# Patient Record
Sex: Female | Born: 1935 | Race: White | Hispanic: No | Smoking: Never smoker
Health system: Southern US, Community
[De-identification: ages and names within clinical notes are randomized; demographics above are authoritative.]

## PROBLEM LIST (undated history)

## (undated) ENCOUNTER — Emergency Department (HOSPITAL_COMMUNITY): Payer: Medicare Other | Source: Home / Self Care

## (undated) DIAGNOSIS — Z96649 Presence of unspecified artificial hip joint: Secondary | ICD-10-CM

## (undated) DIAGNOSIS — N183 Chronic kidney disease, stage 3 unspecified: Secondary | ICD-10-CM

## (undated) DIAGNOSIS — E039 Hypothyroidism, unspecified: Secondary | ICD-10-CM

## (undated) DIAGNOSIS — I2510 Atherosclerotic heart disease of native coronary artery without angina pectoris: Secondary | ICD-10-CM

## (undated) DIAGNOSIS — I495 Sick sinus syndrome: Secondary | ICD-10-CM

## (undated) DIAGNOSIS — I480 Paroxysmal atrial fibrillation: Secondary | ICD-10-CM

## (undated) DIAGNOSIS — F419 Anxiety disorder, unspecified: Secondary | ICD-10-CM

## (undated) DIAGNOSIS — I10 Essential (primary) hypertension: Secondary | ICD-10-CM

## (undated) DIAGNOSIS — R0602 Shortness of breath: Secondary | ICD-10-CM

## (undated) DIAGNOSIS — H547 Unspecified visual loss: Secondary | ICD-10-CM

## (undated) DIAGNOSIS — J45909 Unspecified asthma, uncomplicated: Secondary | ICD-10-CM

## (undated) DIAGNOSIS — K223 Perforation of esophagus: Secondary | ICD-10-CM

## (undated) DIAGNOSIS — J189 Pneumonia, unspecified organism: Secondary | ICD-10-CM

## (undated) DIAGNOSIS — S42293A Other displaced fracture of upper end of unspecified humerus, initial encounter for closed fracture: Secondary | ICD-10-CM

## (undated) DIAGNOSIS — I219 Acute myocardial infarction, unspecified: Secondary | ICD-10-CM

## (undated) DIAGNOSIS — M1990 Unspecified osteoarthritis, unspecified site: Secondary | ICD-10-CM

## (undated) DIAGNOSIS — Z950 Presence of cardiac pacemaker: Secondary | ICD-10-CM

## (undated) DIAGNOSIS — I4892 Unspecified atrial flutter: Secondary | ICD-10-CM

## (undated) DIAGNOSIS — O2230 Deep phlebothrombosis in pregnancy, unspecified trimester: Secondary | ICD-10-CM

## (undated) DIAGNOSIS — J9851 Mediastinitis: Secondary | ICD-10-CM

## (undated) DIAGNOSIS — W19XXXA Unspecified fall, initial encounter: Secondary | ICD-10-CM

## (undated) DIAGNOSIS — S72009A Fracture of unspecified part of neck of unspecified femur, initial encounter for closed fracture: Secondary | ICD-10-CM

## (undated) DIAGNOSIS — K219 Gastro-esophageal reflux disease without esophagitis: Secondary | ICD-10-CM

## (undated) DIAGNOSIS — E7800 Pure hypercholesterolemia, unspecified: Secondary | ICD-10-CM

## (undated) HISTORY — DX: Hypothyroidism, unspecified: E03.9

## (undated) HISTORY — DX: Unspecified atrial flutter: I48.92

## (undated) HISTORY — PX: JOINT REPLACEMENT: SHX530

## (undated) HISTORY — DX: Sick sinus syndrome: I49.5

## (undated) HISTORY — PX: TOTAL HIP ARTHROPLASTY: SHX124

---

## 2002-01-09 ENCOUNTER — Ambulatory Visit (HOSPITAL_COMMUNITY): Admission: RE | Admit: 2002-01-09 | Discharge: 2002-01-09 | Payer: Self-pay | Admitting: Family Medicine

## 2002-01-09 ENCOUNTER — Encounter: Payer: Self-pay | Admitting: Family Medicine

## 2002-06-04 ENCOUNTER — Encounter: Payer: Self-pay | Admitting: Family Medicine

## 2002-06-04 ENCOUNTER — Ambulatory Visit (HOSPITAL_COMMUNITY): Admission: RE | Admit: 2002-06-04 | Discharge: 2002-06-04 | Payer: Self-pay | Admitting: Family Medicine

## 2002-06-06 ENCOUNTER — Encounter: Payer: Self-pay | Admitting: Family Medicine

## 2002-06-06 ENCOUNTER — Ambulatory Visit (HOSPITAL_COMMUNITY): Admission: RE | Admit: 2002-06-06 | Discharge: 2002-06-06 | Payer: Self-pay | Admitting: Family Medicine

## 2002-08-05 ENCOUNTER — Encounter: Payer: Self-pay | Admitting: *Deleted

## 2002-08-05 ENCOUNTER — Ambulatory Visit (HOSPITAL_COMMUNITY): Admission: RE | Admit: 2002-08-05 | Discharge: 2002-08-05 | Payer: Self-pay | Admitting: *Deleted

## 2002-09-18 ENCOUNTER — Encounter: Payer: Self-pay | Admitting: Family Medicine

## 2002-09-18 ENCOUNTER — Ambulatory Visit (HOSPITAL_COMMUNITY): Admission: RE | Admit: 2002-09-18 | Discharge: 2002-09-18 | Payer: Self-pay | Admitting: Family Medicine

## 2002-12-11 ENCOUNTER — Other Ambulatory Visit: Admission: RE | Admit: 2002-12-11 | Discharge: 2002-12-11 | Payer: Self-pay | Admitting: Dermatology

## 2004-02-10 ENCOUNTER — Ambulatory Visit (HOSPITAL_COMMUNITY): Admission: RE | Admit: 2004-02-10 | Discharge: 2004-02-10 | Payer: Self-pay | Admitting: Family Medicine

## 2004-09-06 ENCOUNTER — Ambulatory Visit: Payer: Self-pay | Admitting: Orthopedic Surgery

## 2005-03-07 ENCOUNTER — Ambulatory Visit (HOSPITAL_COMMUNITY): Admission: RE | Admit: 2005-03-07 | Discharge: 2005-03-07 | Payer: Self-pay | Admitting: Family Medicine

## 2005-03-07 ENCOUNTER — Ambulatory Visit: Payer: Self-pay | Admitting: Orthopedic Surgery

## 2005-03-21 ENCOUNTER — Ambulatory Visit: Payer: Self-pay | Admitting: Orthopedic Surgery

## 2005-04-04 ENCOUNTER — Ambulatory Visit (HOSPITAL_COMMUNITY): Admission: RE | Admit: 2005-04-04 | Discharge: 2005-04-04 | Payer: Self-pay | Admitting: Family Medicine

## 2005-04-08 ENCOUNTER — Ambulatory Visit (HOSPITAL_COMMUNITY): Admission: RE | Admit: 2005-04-08 | Discharge: 2005-04-08 | Payer: Self-pay | Admitting: Family Medicine

## 2005-04-11 ENCOUNTER — Ambulatory Visit (HOSPITAL_COMMUNITY): Admission: RE | Admit: 2005-04-11 | Discharge: 2005-04-11 | Payer: Self-pay | Admitting: Family Medicine

## 2005-04-18 ENCOUNTER — Ambulatory Visit: Payer: Self-pay | Admitting: Orthopedic Surgery

## 2005-04-21 ENCOUNTER — Ambulatory Visit (HOSPITAL_COMMUNITY): Admission: RE | Admit: 2005-04-21 | Discharge: 2005-04-21 | Payer: Self-pay | Admitting: Family Medicine

## 2005-07-07 ENCOUNTER — Other Ambulatory Visit: Admission: RE | Admit: 2005-07-07 | Discharge: 2005-07-07 | Payer: Self-pay | Admitting: Dermatology

## 2005-07-21 ENCOUNTER — Ambulatory Visit (HOSPITAL_COMMUNITY): Admission: RE | Admit: 2005-07-21 | Discharge: 2005-07-21 | Payer: Self-pay | Admitting: *Deleted

## 2005-08-15 ENCOUNTER — Encounter (HOSPITAL_COMMUNITY): Admission: RE | Admit: 2005-08-15 | Discharge: 2005-09-15 | Payer: Self-pay | Admitting: Family Medicine

## 2005-09-07 ENCOUNTER — Ambulatory Visit: Payer: Self-pay | Admitting: Internal Medicine

## 2005-10-05 ENCOUNTER — Ambulatory Visit: Payer: Self-pay | Admitting: Internal Medicine

## 2005-12-02 ENCOUNTER — Ambulatory Visit: Payer: Self-pay | Admitting: Internal Medicine

## 2005-12-08 ENCOUNTER — Ambulatory Visit: Payer: Self-pay | Admitting: Internal Medicine

## 2006-01-06 ENCOUNTER — Ambulatory Visit (HOSPITAL_COMMUNITY): Admission: RE | Admit: 2006-01-06 | Discharge: 2006-01-06 | Payer: Self-pay | Admitting: Family Medicine

## 2007-01-09 ENCOUNTER — Ambulatory Visit (HOSPITAL_COMMUNITY): Admission: RE | Admit: 2007-01-09 | Discharge: 2007-01-09 | Payer: Self-pay | Admitting: Family Medicine

## 2007-02-15 ENCOUNTER — Ambulatory Visit (HOSPITAL_COMMUNITY): Admission: RE | Admit: 2007-02-15 | Discharge: 2007-02-15 | Payer: Self-pay | Admitting: *Deleted

## 2007-02-21 ENCOUNTER — Ambulatory Visit (HOSPITAL_COMMUNITY): Admission: RE | Admit: 2007-02-21 | Discharge: 2007-02-21 | Payer: Self-pay | Admitting: *Deleted

## 2007-05-22 ENCOUNTER — Ambulatory Visit: Payer: Self-pay | Admitting: Internal Medicine

## 2007-11-01 HISTORY — PX: CORONARY ARTERY BYPASS GRAFT: SHX141

## 2008-01-08 ENCOUNTER — Ambulatory Visit (HOSPITAL_COMMUNITY): Admission: RE | Admit: 2008-01-08 | Discharge: 2008-01-08 | Payer: Self-pay | Admitting: Family Medicine

## 2008-01-22 ENCOUNTER — Ambulatory Visit (HOSPITAL_COMMUNITY): Admission: RE | Admit: 2008-01-22 | Discharge: 2008-01-22 | Payer: Self-pay | Admitting: Family Medicine

## 2008-03-03 ENCOUNTER — Ambulatory Visit (HOSPITAL_COMMUNITY): Admission: RE | Admit: 2008-03-03 | Discharge: 2008-03-03 | Payer: Self-pay | Admitting: Family Medicine

## 2008-03-17 ENCOUNTER — Encounter: Admission: RE | Admit: 2008-03-17 | Discharge: 2008-03-17 | Payer: Self-pay | Admitting: Orthopedic Surgery

## 2008-05-29 ENCOUNTER — Ambulatory Visit (HOSPITAL_COMMUNITY): Admission: RE | Admit: 2008-05-29 | Discharge: 2008-05-29 | Payer: Self-pay | Admitting: Orthopedic Surgery

## 2008-07-21 ENCOUNTER — Inpatient Hospital Stay (HOSPITAL_COMMUNITY): Admission: RE | Admit: 2008-07-21 | Discharge: 2008-07-25 | Payer: Self-pay | Admitting: Orthopedic Surgery

## 2008-07-22 ENCOUNTER — Encounter (INDEPENDENT_AMBULATORY_CARE_PROVIDER_SITE_OTHER): Payer: Self-pay | Admitting: Orthopedic Surgery

## 2008-07-22 ENCOUNTER — Ambulatory Visit: Payer: Self-pay | Admitting: Vascular Surgery

## 2008-07-25 ENCOUNTER — Inpatient Hospital Stay: Admission: AD | Admit: 2008-07-25 | Discharge: 2008-08-02 | Payer: Self-pay | Admitting: Internal Medicine

## 2008-08-02 ENCOUNTER — Inpatient Hospital Stay (HOSPITAL_COMMUNITY): Admission: AD | Admit: 2008-08-02 | Discharge: 2008-08-18 | Payer: Self-pay | Admitting: Cardiology

## 2008-08-02 ENCOUNTER — Encounter: Payer: Self-pay | Admitting: Emergency Medicine

## 2008-08-02 ENCOUNTER — Ambulatory Visit: Payer: Self-pay | Admitting: Cardiothoracic Surgery

## 2008-08-03 ENCOUNTER — Encounter: Payer: Self-pay | Admitting: Cardiothoracic Surgery

## 2008-08-04 ENCOUNTER — Encounter: Payer: Self-pay | Admitting: Cardiothoracic Surgery

## 2008-08-08 ENCOUNTER — Encounter (INDEPENDENT_AMBULATORY_CARE_PROVIDER_SITE_OTHER): Payer: Self-pay | Admitting: Cardiology

## 2008-08-15 ENCOUNTER — Encounter: Payer: Self-pay | Admitting: Cardiothoracic Surgery

## 2008-08-18 ENCOUNTER — Inpatient Hospital Stay: Admission: RE | Admit: 2008-08-18 | Discharge: 2008-09-08 | Payer: Self-pay | Admitting: Internal Medicine

## 2008-08-29 ENCOUNTER — Ambulatory Visit: Payer: Self-pay | Admitting: Cardiothoracic Surgery

## 2008-08-29 ENCOUNTER — Encounter: Admission: RE | Admit: 2008-08-29 | Discharge: 2008-08-29 | Payer: Self-pay | Admitting: Cardiothoracic Surgery

## 2008-08-30 ENCOUNTER — Emergency Department (HOSPITAL_COMMUNITY): Admission: EM | Admit: 2008-08-30 | Discharge: 2008-08-30 | Payer: Self-pay | Admitting: Emergency Medicine

## 2008-10-06 ENCOUNTER — Encounter (HOSPITAL_COMMUNITY): Admission: RE | Admit: 2008-10-06 | Discharge: 2008-10-30 | Payer: Self-pay | Admitting: Orthopedic Surgery

## 2008-11-04 ENCOUNTER — Encounter (HOSPITAL_COMMUNITY): Admission: RE | Admit: 2008-11-04 | Discharge: 2008-12-04 | Payer: Self-pay | Admitting: Orthopedic Surgery

## 2008-12-15 ENCOUNTER — Encounter (HOSPITAL_COMMUNITY): Admission: RE | Admit: 2008-12-15 | Discharge: 2009-01-14 | Payer: Self-pay | Admitting: Cardiology

## 2009-01-06 ENCOUNTER — Ambulatory Visit: Payer: Self-pay | Admitting: Cardiothoracic Surgery

## 2009-01-06 ENCOUNTER — Inpatient Hospital Stay (HOSPITAL_COMMUNITY): Admission: RE | Admit: 2009-01-06 | Discharge: 2009-01-09 | Payer: Self-pay | Admitting: Cardiology

## 2009-01-06 HISTORY — PX: PACEMAKER INSERTION: SHX728

## 2009-01-07 ENCOUNTER — Encounter (INDEPENDENT_AMBULATORY_CARE_PROVIDER_SITE_OTHER): Payer: Self-pay | Admitting: Cardiology

## 2009-01-09 ENCOUNTER — Emergency Department (HOSPITAL_COMMUNITY): Admission: EM | Admit: 2009-01-09 | Discharge: 2009-01-09 | Payer: Self-pay | Admitting: Emergency Medicine

## 2009-02-09 ENCOUNTER — Encounter (HOSPITAL_COMMUNITY): Admission: RE | Admit: 2009-02-09 | Discharge: 2009-03-11 | Payer: Self-pay | Admitting: Cardiology

## 2009-03-13 ENCOUNTER — Encounter (HOSPITAL_COMMUNITY): Admission: RE | Admit: 2009-03-13 | Discharge: 2009-04-12 | Payer: Self-pay | Admitting: Cardiology

## 2009-04-13 ENCOUNTER — Encounter (HOSPITAL_COMMUNITY): Admission: RE | Admit: 2009-04-13 | Discharge: 2009-05-13 | Payer: Self-pay | Admitting: Cardiology

## 2009-04-28 ENCOUNTER — Emergency Department (HOSPITAL_COMMUNITY): Admission: EM | Admit: 2009-04-28 | Discharge: 2009-04-28 | Payer: Self-pay | Admitting: Emergency Medicine

## 2009-06-03 ENCOUNTER — Ambulatory Visit (HOSPITAL_COMMUNITY): Admission: RE | Admit: 2009-06-03 | Discharge: 2009-06-03 | Payer: Self-pay | Admitting: Urology

## 2009-06-24 ENCOUNTER — Ambulatory Visit (HOSPITAL_COMMUNITY): Admission: RE | Admit: 2009-06-24 | Discharge: 2009-06-24 | Payer: Self-pay | Admitting: Family Medicine

## 2009-07-27 ENCOUNTER — Encounter: Payer: Self-pay | Admitting: Internal Medicine

## 2009-07-28 ENCOUNTER — Ambulatory Visit: Payer: Self-pay | Admitting: Internal Medicine

## 2009-07-28 DIAGNOSIS — R0989 Other specified symptoms and signs involving the circulatory and respiratory systems: Secondary | ICD-10-CM

## 2009-07-28 DIAGNOSIS — R0609 Other forms of dyspnea: Secondary | ICD-10-CM | POA: Insufficient documentation

## 2009-08-14 HISTORY — PX: NM MYOCAR PERF WALL MOTION: HXRAD629

## 2009-08-17 ENCOUNTER — Telehealth: Payer: Self-pay | Admitting: Internal Medicine

## 2009-12-24 ENCOUNTER — Ambulatory Visit (HOSPITAL_COMMUNITY): Admission: RE | Admit: 2009-12-24 | Discharge: 2009-12-24 | Payer: Self-pay | Admitting: Family Medicine

## 2010-02-10 ENCOUNTER — Ambulatory Visit (HOSPITAL_COMMUNITY): Admission: RE | Admit: 2010-02-10 | Discharge: 2010-02-10 | Payer: Self-pay | Admitting: Family Medicine

## 2010-03-14 IMAGING — CR DG CHEST 2V
2 series · 2 of 2 positions shown · non-contrast
Comparison: 02/15/2007 and earlier.

CLINICAL DATA: 86-year-old female preoperative exam.

CHEST - 2 VIEW

[view not recorded (1 of 2)]
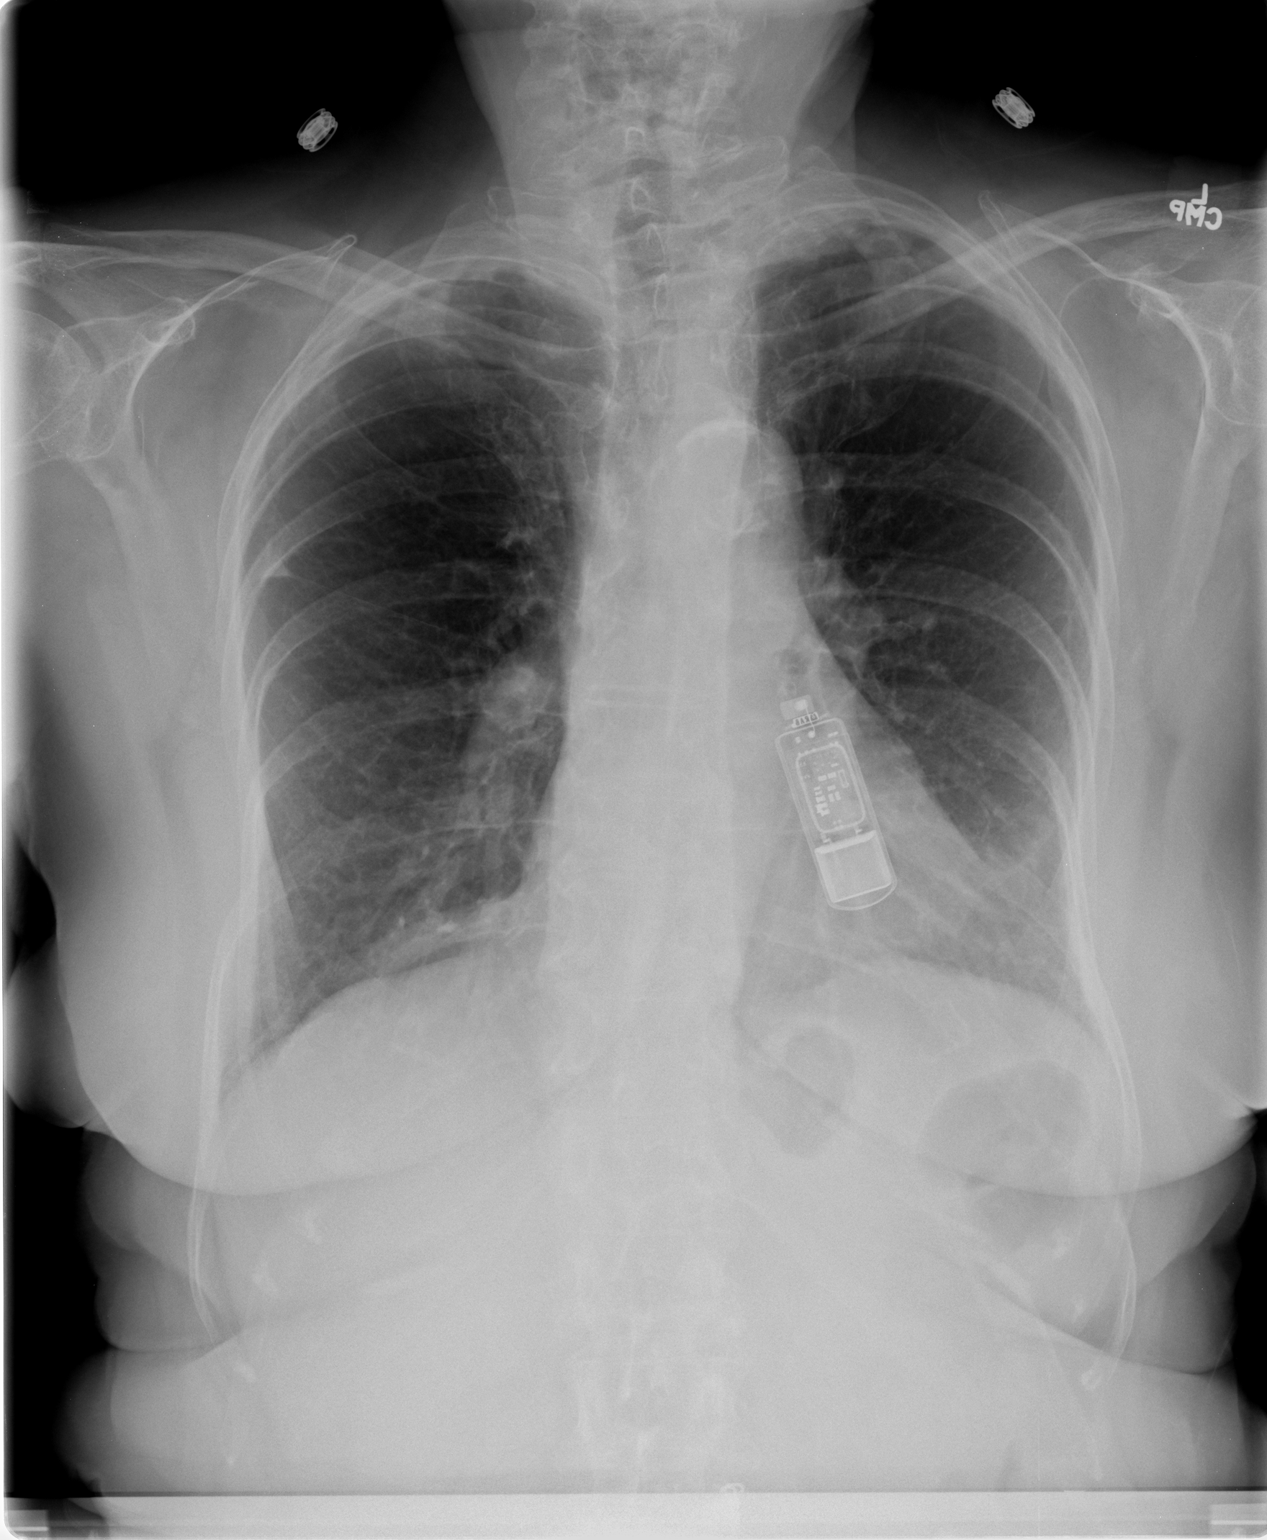

[view not recorded (2 of 2)]
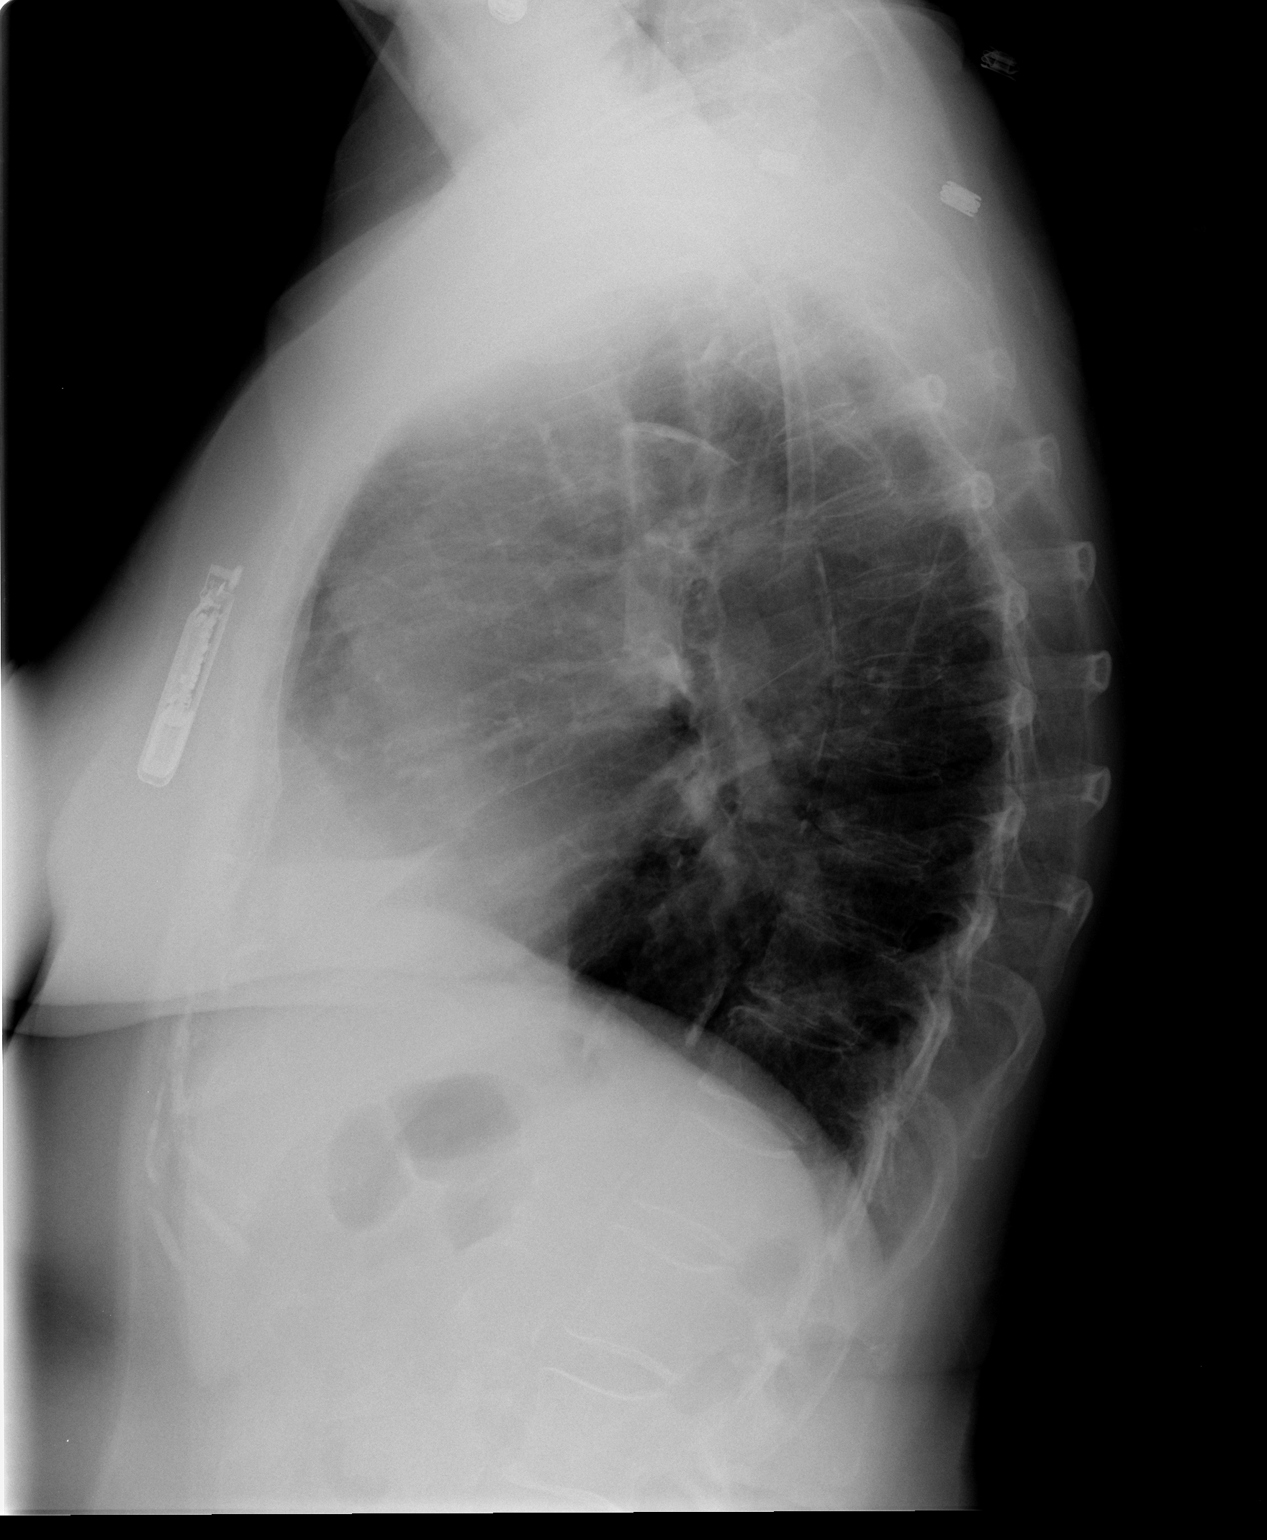

[2 of 2 positions shown; findings below may reference images not displayed]

FINDINGS: New electronic device in the left anterior chest wall.
Stable extensive calcified atherosclerosis of the aorta.  Stable
cardiac size and mediastinal contours.  No cardiomegaly. Stable
visualized osseous structures.  Chronic thickening/scarring along
the lateral aspect of the right major fissure is unchanged since
the CT dated 04/11/2005.  Stable biapical scarring.  No
pneumothorax, pulmonary edema, pleural effusion or acute airspace
opacity.
IMPRESSION: No acute cardiopulmonary abnormality.  New left chest wall
electronic device.

## 2011-02-10 LAB — GLUCOSE, CAPILLARY: Glucose-Capillary: 97 mg/dL (ref 70–99)

## 2011-03-09 ENCOUNTER — Other Ambulatory Visit (HOSPITAL_COMMUNITY): Payer: Self-pay | Admitting: Family Medicine

## 2011-03-09 ENCOUNTER — Encounter (HOSPITAL_COMMUNITY): Payer: Self-pay

## 2011-03-09 ENCOUNTER — Ambulatory Visit (HOSPITAL_COMMUNITY)
Admission: RE | Admit: 2011-03-09 | Discharge: 2011-03-09 | Disposition: A | Discharge status: Home / Self Care | Payer: Medicare Other | Source: Ambulatory Visit | Attending: Family Medicine | Admitting: Family Medicine

## 2011-03-09 DIAGNOSIS — M25579 Pain in unspecified ankle and joints of unspecified foot: Secondary | ICD-10-CM | POA: Insufficient documentation

## 2011-03-09 DIAGNOSIS — M109 Gout, unspecified: Secondary | ICD-10-CM

## 2011-03-09 DIAGNOSIS — M7730 Calcaneal spur, unspecified foot: Secondary | ICD-10-CM | POA: Insufficient documentation

## 2011-03-09 HISTORY — DX: Essential (primary) hypertension: I10

## 2011-03-15 NOTE — Op Note (Signed)
NAME:  Amy [name], Amy [name]                 ACCOUNT NO.:  [id number]   MEDICAL RECORD NO.:  [id number]          PATIENT TYPE:  INP   LOCATION:  5007                         FACILITY:  [facility]   PHYSICIAN:  Robert A. Noemi Chapel, M.D. DATE OF BIRTH:  June 30, 1922   DATE OF PROCEDURE:  07/21/2008  DATE OF DISCHARGE:                               OPERATIVE REPORT   PREOPERATIVE DIAGNOSIS:  Right hip degenerative joint disease.   POSTOPERATIVE DIAGNOSIS:  Right hip degenerative joint disease.   PROCEDURE:  Right total hip replacement using DePuy, hybrid cement to  total system with acetabulum 56-mm Press-Fit.  Pinnacle acetabulum with  2 locking screws and 10-degree polyethylene liner.  Femoral component #4  cemented some at femoral stem with -2 x36 mm hip ball with #3 cement  restricter and 11 mm centralizer.   SURGEON:  Audree Camel. Noemi Chapel, M.D.   ASSISTANT:  Matthew Saras, PA.   ANESTHESIA:  General.   OPERATIVE TIME:  1 hour and 30 minutes.   ESTIMATED BLOOD LOSS:  250 mL.   COMPLICATIONS:  None.   DESCRIPTION OF PROCEDURE:  Ms. Yapp was brought to the operating room  on July 21, 2008, placed on operating table in supine position.  She received Ancef 1 g IV preoperatively for prophylaxis.  After being  placed under general anesthesia, she had a Foley catheter placed under  sterile conditions.  Her right hip was examined under anesthesia.  She  had flexion at 90, extension is 0.  Internal and external rotation at 30  degrees.  Leg lengths were equal.  She was then placed on right lateral  decubitus position.  It is secured on the bed with a mark frame.  Her  right hip and leg was then prepped, using sterile DuraPrep and draped,  using sterile technique.  Originally through a 10-12 cm posterolateral  greater trochanteric incision, initial exposure was made.  The  underlying subcutaneous tissues were incised along with skin incision.  The iliotibial band, gluteus maximus fascia was  incised longitudinally  revealing the underlying sciatic nerve, which was carefully protected.  The short external rotators of the hip and hip capsules were released,  off the femoral neck insertion and tag.  The hip was then posteriorly  dislocated.  Femoral head was found to have grade 3 and grade 4 DJD.  Femoral neck cut was then made 1.5 to 2 cm above the lesser trochanter  in an appropriate amount of abduction and anteversion.  Sequentially  placed retractors were then placed around the acetabulum.  Degenerative  acetabular labrum was removed.  The acetabulum was found to have grade 3  and 4 DJD as well.  Sequential acetabular reaming was then carried out  up to 55-mm size and the appropriate amount of anteversion, abduction,  and inclination, then 56-mm acetabular trial was placed and this gave in  excellent fit.  It was then removed and the actual 56-mm Press-Fit  Pinnacle cup was hammered at position with an excellent fit and  appropriate amount of anteversion and abduction and inclination.  It was  further secured in  place with 2 separate 20-mm screws one in the 11  o'clock and one in the 12 o'clock position.  A 10-degree polyethylene  liner was then placed in the acetabular shell with a 10-degree lift in  the posterior and lateral position.  At that point, the proximal femur  was exposed, sequential reaming was carried out, and broaching to a #4  size and with a #4 broach in place and first a +1.5 and then a -2 hip  ball, which replaced each of these hip reduced.  There was found to be  excellent stability with both of these, but leg lengths were more equal  with the -2 x 36-mm hip ball.  The femoral trial was then removed and #3  cement plug was then placed with an excellent fit and then the femoral  canal was jet lavaged and irrigated with 3 L of saline.  It was then  filled with bone cement and then the actual #4 [place] stem with an 11-mm  centerline was placed down the  femoral canal with an excellent fit, but  the excess cement being removed from around the edges.  This was held in  position and the appropriate amount of anteversion while the cement  hardened.  After the cement hardened, then -2 x 36 mm hip ball was  placed.  The hip was reduced and taken through a full range of motion,  found to be stable up to 80 degrees of internal rotation in both neutral  and 30 degrees of adduction and also stable in abduction and external  rotation.  Leg lengths were found to be equal.  At this point, it is  felt that all the components were of excellent size and stability.  The  wound was further irrigated with saline and then short external rotation  of the hip and hip capsules were reattached to the femoral neck  insertion through to drill holes in the greater trochanter.  Iliotibial  band and gluteus maximus fascia was closed with #1 Ethibond sutures.  Subcutaneous tissues closed with 0 and 2-0 Vicryl.  Subcuticular layer  closed with 4-0 Monocryl.  Sterile dressing were applied.  The patient  then turned supine, checked for leg lengths, they were equal, rotation  of ankle, pulse is 2+ and symmetric.  She then had an abduction pillow  placed.  She was awakened and extubated and taken to recovery room in  stable condition.  Needle and sponge counts were correct x2 at the end  of case.      Robert A. Noemi Chapel, M.D.  Electronically Signed     RAW/MEDQ  D:  07/21/2008  T:  07/22/2008  Job:  [id number]

## 2011-03-15 NOTE — Discharge Summary (Signed)
NAME:  Amy [name], Amy [name]                 ACCOUNT NO.:  [id number]   MEDICAL RECORD NO.:  [id number]          PATIENT TYPE:  EMS   LOCATION:  ED                            FACILITY:  [facility]   PHYSICIAN:  Tery Sanfilippo, MD     DATE OF BIRTH:  1922-10-30   DATE OF ADMISSION:  01/09/2009  DATE OF DISCHARGE:                               DISCHARGE SUMMARY   DISCHARGE DIAGNOSES:  1. Symptomatic sinus bradycardia with a 4-second ventricular pauses      and marked fatigue.  2. Sick sinus syndrome.      a.     Explantation of loop recorder and implantation of a       Medtronic EnRhythm permanent transvenous pacemaker, serial number       PNP [id number], the patient in DDD mode.  3. Left pneumothorax postprocedure,  left chest catheter placed with      resolution of pneumothorax by January 08, 2009 to very minimal and      discontinuing of chest tube.  4. Right lower extremity discomfort.  Negative Deep vein thrombosis by      venous Doppler.  Sequential compression device stockings during the      end of hospitalization.  5. Coronary artery disease, history of bypass grafting in October 2009      after an inferior wall myocardial infarction.  6. History of atrial fibrillation with status post maze procedure.  7. History of bilateral deep vein thrombosis in September 2009      following hip replacement, now off Coumadin.   DISCHARGE CONDITION:  Improved.   PROCEDURES:  1. On January 06, 2009 explantation of loop recorder with implantation of      permanent transvenous pacemaker by Dr. Tery Sanfilippo.  2. On January 06, 2009 implantation of permanent transvenous pacemaker      Medtronic by Dr. Ardelia Mems.  3. On January 08, 2009, placement of chest tube by Dr. Tharon Aquas Trigt.   DISCHARGE MEDICATIONS:  1. Aspirin 81 mg 1 twice a day.  2. Fish oil 2 capsules daily.  3. Prilosec 20 mg daily.  4. Pravastatin 40 mg at bedtime.  5. Metoprolol 50 mg half-tablet twice a day, which will continue her      on  due to lower blood pressure in the hospital.  6. Lisinopril 5 mg daily.  7. Multivitamin daily.  8. Remeron 15 mg one-half daily in the evening.  9. Advair 100/50 one puff daily.  10.Tylenol Extra Strength as needed for pacemaker site pain.   DISCHARGE INSTRUCTIONS:  1. Follow up post pacemaker discharge instructions for movement, and      dressing, and site evaluation.  2. Low-sodium heart-healthy diet.  3. Follow up with Dr. Elisabeth Cara in [place] on January 14, 2009 at 11:30      a.m.  4. Follow up with Dr. Prescott Gum, as needed.   HISTORY OF PRESENT ILLNESS:  An 77 year old female with coronary artery  disease with MI and then bypass grafting in 2009.  For this, she had  complained of an increased fatigue and lack of  energy.  She had had a  loop recorder implanted earlier for syncope.  On the loop recorder, she  was found to have 4-second ventricular pauses and marked fatigue.  Previously, the patient has had atrial fibrillation and undergone a maze  procedure.  It was felt she needed a permanent transvenous pacemaker to  prevent further episodes of syncope as well as to treat her coronary  artery disease.  She was brought into the [facility] on January 06, 2009 by Dr. Elisabeth Cara and permanent transvenous pacemaker was placed  after removal of loop recorder.  The patient did well during procedure  and the next morning chest x-ray did reveal a pneumothorax.  Chest tube  was placed with improvement of pneumothorax.  By January 08, 2009,  pneumothorax improves less than 5% and the chest tube was removed on the  morning of January 09, 2009 only a sliver of pneumothorax remains,  otherwise lungs were aerated.   During hospitalization, the patient did complain of some right lower  extremity discomfort.  We did a venous Doppler that was negative for  DVT.  We did put SCD stockings on order them.  To prevent DVT, she had  has had one in the past but, she was high-risk for bleeding at the  pacer  and the pneumothorax site and we did not want a hemathorax, so SCD  stockings were the appropriate therapy.   By January 09, 2009, she was stable and ready for discharge home.  We will  follow up with Dr. Elisabeth Cara next week for pacer site evaluation.      Otilio Carpen. [place], N.P.      Tery Sanfilippo, MD  Electronically Signed    LRI/MEDQ  D:  01/09/2009  T:  01/10/2009  Job:  [id number]   cc:   Tery Sanfilippo, MD  Halford Chessman, M.D.  Eden Lathe. Einar Gip, MD  Ivin Poot, M.D.

## 2011-03-15 NOTE — Cardiovascular Report (Signed)
NAME:  Borgwardt, Amy                 ACCOUNT NO.:  [id number]   MEDICAL RECORD NO.:  [id number]          PATIENT TYPE:  INP   LOCATION:  2902                         FACILITY:  [facility]   PHYSICIAN:  Eden Lathe. Einar Gip, MD       DATE OF BIRTH:  [birth year]   DATE OF PROCEDURE:  08/02/2008  DATE OF DISCHARGE:                            CARDIAC CATHETERIZATION   PROCEDURES PERFORMED:  1. Left ventriculography.  2. Selective left coronary arteriography.  3. Percutaneous transluminal coronary angioplasty and thrombectomy of      right coronary artery using Fetch thrombectomy catheter.  4. Percutaneous transluminal coronary angioplasty and stenting of the      proximal right coronary artery with a 3.5 x 24 mm Driver stent.  5. Closure of the right femoral arterial access with StarClose.   INDICATIONS:  [name] is an 77 year old fairly active female  with history of hypertension, hyperlipidemia, osteoarthritis, status  post right hip replacement in September 2008 following a negative stress  Myoview.  She has developed a DVT following hip replacement and  presently was on Coumadin for the same.  She has had a couple of  episodes of syncope and she presently has an implantable loop recorder  for evaluation of the same.  She was doing well until early this morning  when she presented to [facility] after she had acute onset of  severe chest pain while eating in a restaurant with the family.  She was  found to have inferior ST-segment elevation with ST-segment depression  anteriorly, hence she was rushed emergently to Cardiac Catheterization  Lab as a STEMI to evaluate her coronary anatomy.  In the cath lab she was chest pain free and there was resolution of ST  elevation and hence case proceeded urgently as a NSTEMI.   HEMODYNAMIC DATA:  The left ventricle pressure was 140/1 with end-  diastolic pressure of 10 mmHg.  The aortic pressure was 135/61 with a  mean of 95 mmHg.  There was  no significant pressure gradient across the  aortic valve.   ANGIOGRAPHIC DATA:  Left ventricle:  Left ventricular systolic function  was normal with the ejection fraction of 55%-60%.  There was no regional  wall motion abnormality.  There was no significant mitral regurgitation.   Right coronary artery:  Right coronary artery is a large-caliber vessel.  It gives origin to 2-3 moderate-sized PDA branches and a very large PLA  branch.  The proximal segment of the right coronary artery has a  thrombotic 99% stenosis.  The rest of the segment is smooth and normal.   Left main coronary artery:  Left main coronary artery is large-caliber  vessel, smooth, and normal.   Circumflex coronary artery:  Circumflex coronary artery is a large-  caliber vessel.  It gives origin to a small obtuse marginal 1.  Smooth  and normal.   Ramus intermediate:  Ramus intermediate is a moderate-to-large caliber  vessel.  Proximal segment of the ramus intermediate has a high-grade 90-  95% stenosis.   LAD:  LAD is a large-caliber vessel  in the proximal segment.  However,  there was a very complex long segment 95% stenosis from the proximal to  the midsegment of the LAD followed by a tandem 99% stenosis.  The LAD  gives origin to several small diagonals and a small-to-moderate size  diagonal 2 in the midsegment.   DISCUSSION:  An elderly female with severe triple-vessel coronary artery  disease.  The patient was chest pain-free during cardiac catheterization  and had no wall motion abnormality and the ST-segment elevation was back  to baseline.  Hence, the further revascularization was done in a non-  STEMI fashion as she was hemodynamically stable with TIMI III flow in  all 3 vessels.  I did obtain a surgical consultation with Dr. Tharon Aquas  Trigt who felt that we could safely proceed with intervention to the  right coronary artery given on and off persistent pain without ST  changes in her chest, with  implantation of a non-drug-eluting stent  following which he plans to operate for the LAD and ramus intermediate  stenosis in the next few days after stabilization.  The RCA was clearly  the culprit vessel with significant thrombus burden and high grade  stenosis. During this process, we will continue with heparin and  Integrilin only without any Plavix.  She will be on aspirin.   TECHNIQUE OF INTERVENTION:  Successful thrombectomy using Fetch  catheter.  I was able to make 2 passes with the establishment of a brisk  flow and significant decrease in thrombus burden.  This was followed by  stenting with a 3.5 x 24 mm Driver stent, which was deployed at 12  atmospheric pressure for 34 seconds and postdilated with 3.75 x 21 mm  Sprinter [place] at 16 atmospheric pressure for 35 seconds.  Overall, the  stenosis was reduced from thrombotic 90% to 0% with brisk TIMI III to  TIMI III flow maintained at the end of the procedure.  The patient was  completely asymptomatic and chest pain-free.   RECOMMENDATIONS:  The patient will be admitted to the intensive care  unit for close followup.  She will be followed up by Dr. Tharon Aquas Trigt  for evaluation of bypass surgery to the left system.   A total of 140 mL of contrast was utilized for diagnostic and  interventional procedure.  Her left femoral arterial access was closed  with StarClose with excellent hemostasis.  Venous sheath was sutured in  place.   TECHNIQUE OF PROCEDURE:  Under usual sterile precautions, using a 6-  French left femoral venous access and 6-French multipurpose B2 catheter  was advanced into the ascending aorta and then to the left ventricle.  Left ventriculography was performed both in the LAO and RAO projection.  The catheter was then pulled into the ascending aorta and right coronary  was selectively engaged.  Angiography was performed, then left main  coronary artery was selectively engaged and angiography was performed,   then the catheter was pulled off the body in the usual fashion.   After having extensive discussion with Dr. Tharon Aquas Trigt for further  therapy, we decided to proceed with stenting of the right coronary  artery.   Exchanging the 6-French left femoral arterial access sheath to a 7-  [place] sheath, a 7-French FR-4 guidewire was advanced into the ascending  aorta, then right coronary artery was selectively engaged and  angiography was performed.  Using heparin for anticoagulation  maintaining ACT greater than 200, a 190 cm x 0.014 of  an Scientist, research (life sciences) guidewire was advanced into the right coronary artery.  Using  Fetch catheter, 2 Fetch passes were made.  There was establishment of  significant decrease in thrombus burden.  We decided to proceed with  stenting of the same with implantation of a 3.5 x 24 mm Driver stent  following which this was postdilated with a 3.75 x 21 mm Sprinter [place]  with excellent results.  The results were confirmed in multiple views.  The guide catheter was disengaged after the guidewire was pulled and  pulled out of the body over a J-wire.   Left femoral arteriography was performed through the arterial access  sheath and the access was closed with StarClose with excellent  hemostasis.  The patient tolerated the procedure well.  There was no  immediate complication noted.      Eden Lathe. Einar Gip, MD  Electronically Signed     JRG/MEDQ  D:  08/02/2008  T:  08/02/2008  Job:  [id number]   cc:   Halford Chessman, M.D.

## 2011-03-15 NOTE — Discharge Summary (Signed)
NAME:  Amy [name], Amy [name]                 ACCOUNT NO.:  [id number]   MEDICAL RECORD NO.:  [id number]          PATIENT TYPE:  INP   LOCATION:  5007                         FACILITY:  [facility]   PHYSICIAN:  Robert A. Noemi Chapel, M.D. DATE OF BIRTH:  09/28/1922   DATE OF ADMISSION:  07/21/2008  DATE OF DISCHARGE:                               DISCHARGE SUMMARY   ADDENDUM   DISCHARGE MEDICATIONS:  1. Protonix 40 mg p.o. daily.  2. Norvasc 5 mg 1 p.o. daily, hold if blood pressure less than 120/80.  3. Advair 100/50 one puff twice a day.  4. Hydrochlorothiazide 12.5 mg daily, hold if blood pressure less than      120/80.  5. Benicar 20 mg 1 tablet daily, hold if blood pressure less than      120/80.  6. Coumadin 4 mg daily.  7. Tylenol 325 mg 1-2 tablets p.o. q.4 h. p.r.n. pain.      Kirstin Shepperson, P.A.      Robert A. Noemi Chapel, M.D.  Electronically Signed    KS/MEDQ  D:  07/25/2008  T:  07/25/2008  Job:  [id number]

## 2011-03-15 NOTE — Discharge Summary (Signed)
NAME:  Amy [name], Amy [name]                 ACCOUNT NO.:  [id number]   MEDICAL RECORD NO.:  [id number]          PATIENT TYPE:  INP   LOCATION:  5007                         FACILITY:  [facility]   PHYSICIAN:  Robert A. Noemi Chapel, M.D. DATE OF BIRTH:  23-Jul-1922   DATE OF ADMISSION:  07/21/2008  DATE OF DISCHARGE:  07/25/2008                               DISCHARGE SUMMARY   ADMITTING DIAGNOSES:  1. End-stage degenerative joint disease, right hip.  2. Hypertension.  3. History of cardiac arrhythmia with a heart monitor implant.  4. Gastroesophageal reflux disease.  5. Osteoporosis.  6. Chronic obstructive pulmonary disease.   DISCHARGE DIAGNOSES:  1. End-stage degenerative joint disease, right hip status post total      hip replacement.  2. Postoperative blood-loss anemia.  3. Right leg deep venous thrombosis.  4. Preoperative renal insufficiency that has now resolved.  5. Hypertension.  6. Chronic obstructive pulmonary disease.  7. Gastroesophageal reflux disease.  8. Cardiac arrhythmia.   HISTORY OF PRESENT ILLNESS:  The patient is a 77 year old white female  with a history of end-stage DJD of her right hip.  She failed  conservative care including anti-inflammatories and intraarticular  cortisone injection.  Risks, benefits, and possible complications of a  right total hip replacement were discussed with the patient and her  family.  They understand these and are without question.   PROCEDURES IN-HOUSE:  On July 21, 2008, the patient underwent a  right total hip replacement by Dr. Para March.  She was admitted  postoperatively for pain control, DVT prophylaxis, and physical therapy.   HOSPITAL COURSE:  On postop day 1, the patient complained of right ankle  and calf pain.  A Doppler revealed a small DVT from mid calf distal to  ankle.  Therefore, the patient was changed to full-dose Lovenox with  renal dosing adjusted secondary to her renal insufficiency.  The patient  had a significant  amount of itching on postop day 1 from Vicodin, so she  was switched to Darvon.  On postop day 2 during the day, she had a  vasovagal episode while on the bedside commode with her blood pressure  dropping to 76/38 while sitting up and she was immediately put back to  bed.  Her blood pressure was 92/48 after being put back to bed.  She was  given 0.2 mg of Narcan and following her dose of Narcan and being placed  back to bed, her blood pressure rose and she was much more alert and  oriented.  She was placed back on IV fluids at 150 mL an hour for the  next 3 hours.  Two units of packed red blood cells were transfused  because her hemoglobin was 8.6 and she was obviously symptomatic.  She  was given 20 mg of Lasix IV between units.  Physical therapy was missed  in the afternoon due to this vasovagal episode.  On postop day 3, the  patient complained more of pain because we have been holding her pain  medicine due to the episodes yesterday and requiring Narcan.  Her  hemoglobin was  improved to 11.9 posttransfusion.  Her INR was still  subtherapeutic at 1.7.  Surgical wound was well approximated with  minimal swelling.  She had a 2+ dorsalis pedis pulse.  On postop day 3,  she walked approximately 25 feet in her morning session of physical  therapy and 46 feet with her afternoon session of physical therapy.  She  is reminded regularly of her posterior total hip precautions.  On postop  day 4 in the morning, her hemoglobin is stable at 11.9.  She is  metabolically stable with a creatinine of 0.98.  Her calcium is normal  at 8.6.  Her INR is therapeutic at 2.0.  Her O2 sats are 93% on room  air.  She has no shortness of breath.  Her blood pressure is stable at  129/63.  She is afebrile.  Her pulse is 76 and regular.  Surgical wound  is well approximated with a small amount of serous drainage.  She has a  moderate amount of swelling.  No redness.  No ecchymosis.  She has a 2+  dorsalis pedis  pulses.  She is being discharged to the pain center in  stable condition.  Weightbearing as tolerated with posterior total hip  precautions and a regular diet.  She will need physical therapy daily  for her ambulation and lower extremity strengthening.  She will need  occupational daily for ADLs.  She can shower on August 01, 2008.  She  needs to follow up with Dr. Para March in the office on August 04, 2008, for  staple removal and x-rays.  Our office number is [id number].  Please call  with a temperature greater than 101.5, increased pain, increased  swelling, increased drainage.      Kirstin Shepperson, P.A.      Robert A. Noemi Chapel, M.D.  Electronically Signed    KS/MEDQ  D:  07/25/2008  T:  07/25/2008  Job:  [id number]   cc:   Bonne Dolores, M.D.  Leslye Peer, MD

## 2011-03-15 NOTE — Op Note (Signed)
NAME:  Amy [name], Amy [name]                 ACCOUNT NO.:  [id number]   MEDICAL RECORD NO.:  [id number]          PATIENT TYPE:  INP   LOCATION:  [id number]                         FACILITY:  [facility]   PHYSICIAN:  Ivin Poot, M.D.  DATE OF BIRTH:  1922/01/13   DATE OF PROCEDURE:  01/07/2009  DATE OF DISCHARGE:                               OPERATIVE REPORT   OPERATION:  Placement of left chest tube.   PREOPERATIVE DIAGNOSIS:  Left pneumothorax status post transvenous  pacemaker placement.   POSTOPERATIVE DIAGNOSIS:  Left pneumothorax status post transvenous  pacemaker placement.   SURGEON:  Ivin Poot, MD   ANESTHESIA:  Local 1% lidocaine.   PROCEDURE:  Ms. Tambasco is an elderly patient who had undergone previous  coronary artery bypass grafting on August 09, 2008 for multivessel  disease.  She had a preoperative history of arrhythmias and had a  recording loop device implanted.  She gradually developed more problems  with brady arrhythmias and had a transvenous pacemaker placed by Dr.  Elisabeth Cara.  The pacemaker functioned well.  However, the x-ray on the  first postop day on January 07, 2009 showed a 25% left pneumothorax.  I  was asked to see Ms. Horsley for placement of a chest tube.   After informed consent and a time-out for the proper patient and proper  site was obtained, the left chest was prepped and draped as a sterile  field.  Lidocaine 1% was infiltrated in the third intercostal space in  the midclavicular line.  A small incision was made and through this a  chest catheter was inserted.  This was connected to an underwater seal  Pleur-evac drainage system and there was good fluctuation of the fluid  level with inspiration.  The Pleur-evac was placed to suction and a  followup chest x-ray showed significant reduction in the pneumothorax.  There were no complications.      Ivin Poot, M.D.  Electronically Signed     PV/MEDQ  D:  01/07/2009  T:  01/08/2009  Job:   [id number]

## 2011-03-15 NOTE — Consult Note (Signed)
NAME:  Amy [name], Amy [name]                 ACCOUNT NO.:  [id number]   MEDICAL RECORD NO.:  [id number]          PATIENT TYPE:  INP   LOCATION:  2902                         FACILITY:  [facility]   PHYSICIAN:  Ivin Poot, M.D.  DATE OF BIRTH:  1921/11/07   DATE OF CONSULTATION:  08/02/2008  DATE OF DISCHARGE:                                 CONSULTATION   PHYSICIAN REQUESTING CONSULTATION:  Ulice Dash R. Einar Gip, MD   PRIMARY CARDIOLOGIST:  Berneta Levins, MD   REASON FOR CONSULTATION:  Severe multivessel coronary disease with acute  inferior MI.   CHIEF COMPLAINT:  Chest pain.   HISTORY OF PRESENT ILLNESS:  I was asked to evaluate this 77 year old  white female for potential multivessel coronary bypass revascularization  for recently diagnosed severe three-vessel coronary disease.  The  patient underwent a right total hip replacement in September 2009.  She  had perioperative vasovagal spells with hypotension.  She was anemic and  received transfusions.  She developed a DVT of her right leg below the  knee and received a Coumadin therapy.  She was discharged home a week  ago.  She was admitted yesterday with acute resting chest pain.  She was  evaluated in the emergency department where an inferior EKG leads showed  ST-segment elevation.  She then underwent emergency cardiac cath by Dr.  Einar Gip, who demonstrated a 95% stenosis of the proximal RCA with  thrombus, which was treated with a bare-metal stent with good  reperfusion.  She also had significant disease of the left coronary  system with a 95% stenosis at the LAD and a 90% stenosis in the ramus  intermediate.  Her LVEF was 60%.  LVEDP was 15.  There is no significant  MR.  She was placed on Integralin and her Coumadin has been held with a  INR at presentation of 2.8.  The plan is to stabilize her and to proceed  with multivessel surgical evaluation to treat the left coronary system  next week if she recovers from her MI.  Her cardiac  enzymes are  currently declining and she has been hemodynamically stable without  chest pain.   PAST MEDICAL HISTORY:  1. COPD with GERD, reflux, and asthmatic bronchitis.  2. Recent total right hip replacement, July 21, 2008, with      perioperative right leg DVT on Coumadin and postop anemia requiring      transfusion.  3. Hypertension.  4. Osteoporosis.  5. History of cardiac arrhythmia, status post loop recorder placement      by Dr. Tami Ribas.   HOME MEDICATIONS:  1. Prilosec 20 mg a day.  2. Advair Diskus 100/50 b.i.d.  3. Norvasc 5 mg daily.  4. Hydrochlorothiazide 12.5 mg daily.  5. Benicar 20 mg daily.  6. Coumadin 3 mg daily.   ALLERGIES:  Intolerance to statins due to myalgias, nausea from Vicodin.   SOCIAL HISTORY:  She is a nonsmoker and nondrinker.  She lives with her  husband and has been active in driving.   SOCIAL HISTORY:  Positive for coronary artery disease in her family  and  her sister has had heart bypass surgery.   REVIEW OF SYSTEMS:  GENERAL:  No weight loss or fever.  She has been  walking with a rolling walker at home following hip surgery.  She still  has her skin staples in her hip incision.  They are due to come out this  week.  Incision itself has healed without drainage or cellulitis.  ENT:  Negative for active dental problems and she sees a dentist twice a year.  She denies difficulty swallowing.  She had no aspiration problems after  her hip replacement.  Thoracic review is negative for chest trauma or  recent URI.  CARDIAC:  Positive for coronary artery disease.  She has no  history of cardiac murmur or rheumatic heart disease as a child.  GI:  Positive for GERD, mild.  No hepatitis, jaundice, or blood per rectum.  UROLOGIC:  Negative for kidney Geyer or UTI.  Her urinalysis is clean.  ENDOCRINE:  Negative diabetes or thyroid disease - positive for DVT of  the right leg and negative for claudication or TIA.  NEUROLOGIC:  Negative stroke  or seizure.  HEMATOLOGIC:  Positive for anemia requiring  transfusion after hip replacement and a current hemoglobin of 8.2 g.  She denied any bleeding problems or nose bleeds on the Coumadin therapy.   PHYSICAL EXAMINATION:  VITAL SIGNS:  She is 5 feet 2 inches and weight  is 130 pounds, blood pressure 110/50, pulse 70 in sinus, respirations  18, and saturation 90% on 2 L.  GENERAL APPEARANCE:  A small elderly female in the CCU, in no acute  stress after PCI for acute DMI.  HEENT:  Normocephalic.  Dentition good.  NECK:  Without JVD.  She has a soft right carotid bruit.  She has no  cervical adenopathy or mass.  Breath sounds are clear.  There is no  thoracic deformity.  CARDIAC:  Rhythm is regular without S3 gallop or murmur.  ABDOMINAL:  Soft without pulsatile mass.  EXTREMITIES:  No clubbing, cyanosis, or edema.  Peripheral pulses are  intact.  NEUROLOGIC:  Nonfocal.   LABORATORY DATA:  I reviewed the coronary arteriograms with Dr. Einar Gip,  which show multivessel coronary artery disease in her right coronary,  the culprit vessel has been successfully treated with a PCI and bar-  mental stent.  Her creatinine is 1.0.  Her hemoglobin is 8.2.  Her INR  is decreased from 2.8-1.5 after a dose of vitamin K.  Her chest x-ray  shows some COPD, but no active infiltrate or pleural effusion.  She has  a sheath in her right groin and that is scheduled to be removed today.  Her platelet count on the Integrilin has remained normal.   IMPRESSION AND PLAN:  The patient has residual left coronary artery  disease with high-grade stenosis.  She would benefit from surgical  revascularization of these vessels as they are nonfavorable for stent  placement.  We will allow her to recover firm MI and allow the INR to  normalize and schedule surgery for coronary revascularization on August 06, 2008.      Ivin Poot, M.D.  Electronically Signed     PV/MEDQ  D:  08/03/2008  T:  08/04/2008   Job:  [id number]

## 2011-03-15 NOTE — Discharge Summary (Signed)
NAME:  Amy [name], Amy [name]                 ACCOUNT NO.:  [id number]   MEDICAL RECORD NO.:  [id number]          PATIENT TYPE:  INP   LOCATION:  2016                         FACILITY:  [facility]   PHYSICIAN:  Ivin Poot, M.D.  DATE OF BIRTH:  12-May-1922   DATE OF ADMISSION:  08/02/2008  DATE OF DISCHARGE:  08/14/2008                               DISCHARGE SUMMARY   HISTORY:  The patient is an 77 year old white female with a recent  history of a right total hip replacement in September of 2009.  Following this procedure, she had perioperative vasovagal changes with  hypotension.  She was treated for anemia.  She developed a deep venous  thrombosis of her right leg below the knee and was placed on Coumadin  therapy.  She was discharged approximately 1 week prior to this  admission.  On the date of admission, she developed acute resting chest  pain.  She presented to the emergency department where she was found to  have inferior EKG leads that revealed a ST-segment elevation.  She was  felt to require admission for further evaluation and treatment including  prompt cardiac catheterization.   PAST MEDICAL HISTORY:  1. COPD.  2. Gastroesophageal reflux.  3. History of asthmatic bronchitis.  4. History of recent right total hip replacement on July 21, 2008      with a perioperative right leg DVT.  5. History of recent postoperative anemia following her hip surgery.  6. Hypertension.  7. Osteoporosis.  8. History of cardiac arrhythmias status post loop recorder placement      by Dr. Tami Ribas.   PAST MEDICAL HISTORY:  1. Prilosec 20 mg daily.  2. Advair discus 100/50 one puff b.i.d.  3. Norvasc 5 mg daily.  4. Hydrochlorothiazide 12.5 mg daily.  5. Benicar 20 mg daily.  6. Coumadin 3 mg daily.   ALLERGIES:  1. Intolerance to STATINS with myalgias.  2. She also has a intolerance to VICODIN which causes significant      nausea.   FAMILY HISTORY/SOCIAL HISTORY/REVIEW OF  SYMPTOMS/PHYSICAL EXAMINATION:  Please see the history and physical done at the time of admission.   HOSPITAL COURSE:  The patient was admitted.  She was taken to the  cardiac catheterization lab where she was found to have significant  disease of the left coronary system to include a 95% stenosis of the LAD  and a 90% stenosis of the ramus intermedius.  Left ventricular ejection  fraction was 60%, and left ventricular end-diastolic pressure was 15.  There was no significant mitral regurgitation.  She was placed on  Integralin, and her Coumadin was placed on hold.  Initial INR at  hospitalization was 2.8.  Due to these findings, surgical consultation  was obtained with Ivin Poot, M.D. who evaluated the patient and  studies and agreed with recommendations to proceed with surgical  revascularization in the next several days following admission after  further evaluation and recovery from her infarction.  Of note, the  patient did undergo a PCI of the right coronary artery during the  catheterization by Dr.  Ganji.  A driver stent was placed.  The patient  had preoperative vein mapping, and the vein maps were felt to be  adequate for proceeding.  A 2-D echocardiogram was also obtained, and  this also confirmed normal left ventricular function.  Ejection fraction  was greater than 55%.  There was mild aortic insufficiency, mild mitral  regurgitation, trace tricuspid regurgitation and mild aortic stenosis.  The aortic valve area was 1.67 cm2.  Preoperative carotid duplex did  reveal a left ICA stenosis in the 60-80% range; however, closer to the  60% lesion.  The patient was felt to be stable, and on August 06, 2008,  she was taken to the cardiac operating room where she underwent the  following procedure:  Coronary artery bypass grafting x3.  The following  grafts were placed:  1. Left internal mammary artery to the LAD.  2. Saphenous vein graft to the ramus intermedius.  3. Saphenous  vein graft to the right coronary.   The patient did require 3 units of blood was transfused during the  procedure for preoperative hemoglobin of 7.4.   POSTOPERATIVE HOSPITAL COURSE:  The patient has done well overall.  She  did have a moderate volume overload requiring diuresis.  All routine  lines, monitors and drainage devices were discontinued in the standard  fashion.  She has maintained a normal sinus rhythm without significant  cardiac dysrhythmias or ectopy.  Her anemia was stabilized.  She did  require further transfusion during the postoperative period.  Her most  recent hemoglobin dated August 13, 2008 was 10.8.  Incisions are  healing well.  There is no evidence of infection.  Oxygen has been  weaned, and she maintains good saturations on room air.  Her Coumadin  has been restarted for deep venous thrombosis therapy that she will  require for an additional 3 months.  She has started physical therapy  and cardiac rehabilitation.  It is felt that she is best a candidate to  return to the skilled nursing facility.  Tentatively, that is scheduled  for the next day or so.   CONDITION ON TRANSFER:  Stable and improving.   MEDICATIONS ON DISCHARGE:  1. Toprol XL 25 mg daily.  2. Crestor 10 mg q.h.s.  3. Advair discus 100/50 one puff twice daily.  4. Coumadin dosage to be determined at discharge.  5. Plavix 75 mg daily.  6. Protonix 40 mg twice daily.  7. Lasix 40 mg daily.  8. Potassium chloride 20 mEq daily.  9. Lasix and potassium will be for an additional 7 days.  10.For pain, Ultram 50 mg 1-2 q.6h. as needed.   FOLLOWUP:  1. Dr. Prescott Gum on August 29, 2008 at 3:00 p.m.  2. Additionally, she is instructed to have an appointment arranged at      Dr. Martie Round office, [phone] in 2 weeks.  3. She should also have an appointment arranged see Dr. Noemi Chapel, [phone], in the next couple weeks for follow up for her hip surgery.   FINAL DIAGNOSIS:  Severe multivessel  disease with a presenting acute  inferior myocardial infarction, now status post surgical  revascularization as described.   OTHER DIAGNOSES:  1. Anemia.  2. Chronic obstructive pulmonary disease.  3. Gastroesophageal reflux.  4. Recent right total hip replacement on July 21, 2008.  5. Perioperative right leg deep venous thrombosis on Coumadin.  6. Hypertension.  7. Osteoporosis.  8. History of cardiac arrhythmia with previous  a loop recorder      placement.   WOUND CARE:  The patient may clean her incisions gently with soap and  water.   ACTIVITIES:  The patient should continue using her incentive spirometry  and doing deep breathing and coughing as tolerated daily.  She should  continue her physical therapy with progressive ambulation with total hip  replacement precautions and weightbearing as tolerated.  She should  continue sternal precautions, no lifting greater than 10 pounds, with  care not to have any exertion requiring pushing or pulling with her  arms.  She should have two person assistance with getting out of bed as  able, and this should be monitored in regard to her progression.      John Giovanni, P.A.-C.      Ivin Poot, M.D.  Electronically Signed    WEG/MEDQ  D:  08/13/2008  T:  08/13/2008  Job:  [id number]   cc:   Eden Lathe. Einar Gip, MD  Leslye Peer, MD  Audree Camel. Noemi Chapel, M.D.

## 2011-03-15 NOTE — Assessment & Plan Note (Signed)
OFFICE VISIT   Amy [name], Amy [name]  DOB:  23-Apr-1922                                        August 29, 2008  CHART #:  [id number]   The patient is status post coronary artery bypass grafting x3 by Dr. Prescott Gum on August 06, 2008.  Postop, the patient was progressing well, but  deconditioned mostly secondary to a recent right total hip replacement  on July 21, 2008.  It was felt that the patient will require  skilled nursing facility for inpatient rehab to improve her strength  prior to discharge home.  The patient presents today for followup visit.  The patient states that she is improving.  She is ambulating with a  walker about 280 feet daily.  She still is unable to stand by herself  from bed to standing or chair to standing.  She denies any significant  pain or shortness of breath.  She denies any opening or drainage from  any of her incision sites.  The patient states that she wants to go  home, but I would like the patient to be able to get out of bed on her  own prior to returning home.   PHYSICAL EXAMINATION:  VITAL SIGNS:  Blood pressure of 109/68, pulse of  88, respirations 18, and O2 sats 95% on room air.  RESPIRATORY:  Clear to auscultation bilaterally.  CARDIAC:  Regular rate and rhythm.  INCISIONS:  All incisions are clean, dry, intact, and healing well.  EXTREMITIES:  No significant peripheral edema noted.   STUDIES:  The patient had PA and lateral chest x-ray done today, August 29, 2008, which is clear.  Sternal wires are all stable and intact.  No  pneumonia, effusions, or atelectasis noted.  Improved bilateral  aeration.   IMPRESSION AND PLAN:  The patient is slowly improving.  She was seen and  evaluated by Dr. Prescott Gum.  Dr. Prescott Gum evaluated the patient's x-  ray.  Agreed to continue the patient's current medications.  Coumadin  continue to be dosed appropriately, with regular PT/INR checks.  The  patient has an appointment  with Dr. Mathis Bud next week, but is told to  follow up with one of her partners after Dr. Mathis Bud leaves the  practice.  The patient is recommended to stay another 1-2 weeks at the  skilled nursing facility at [facility] to continue working on her  strength.  We will release the patient from our office, but she is told  to contact us if she has any surgical issues.   Ivin Poot, [name].D.  Electronically Signed   KMD/MEDQ  D:  08/29/2008  T:  08/30/2008  Job:  [id number]   cc:   Leslye Peer, MD

## 2011-03-15 NOTE — Discharge Summary (Signed)
NAME:  Amy [name], Amy [name]                 ACCOUNT NO.:  [id number]   MEDICAL RECORD NO.:  [id number]          PATIENT TYPE:  INP   LOCATION:  2016                         FACILITY:  [facility]   PHYSICIAN:  Ivin Poot, M.D.  DATE OF BIRTH:  June 10, 1922   DATE OF ADMISSION:  08/02/2008  DATE OF DISCHARGE:  08/18/2008                               DISCHARGE SUMMARY   ADDENDUM:  The patient was tentatively scheduled for transfer to the  nursing facility on August 15, 2008.  She, however, on that date felt  fairly poorly compared to the previous days.  Additionally, she felt  quite weak.  She appeared to look more pale.  She underwent an  evaluation for this including electrolytes and CBC.  These were  essentially unremarkable.  Her hemoglobin and hematocrit were actually  somewhat improved.  Electrolytes were within normal limits.  Chest x-ray  appeared to be improved, as well as in regards to her atelectasis and  air space disease.  As she had a previous history of DVT, a venous  duplex was reobtained to both lower extremities, and she was found to  have clearing of her DVT on the right side; however, a new DVT was found  no the left side to the level of her common femoral vein.  No other  significant findings were found, and a CT scan was negative for  pulmonary embolism.  Over the weekend, she continued to progress quite  nicely.  She is now at essentially her baseline.  She is now felt to be  stable for transfer to the nursing facility on today's date.   MEDICATIONS AT TIME OF THIS DICTATION:  1. Toprol-XL 25 mg daily.  2. Crestor 10 mg q.h.s.  3. Ultram 50 mg 1-2 every 6 hours as needed for pain.  4. Lasix 20 mg daily.  5. Protonix 40 mg daily.  6. Plavix 75 mg daily.  7. Coumadin 2.5 mg daily.  8. Advair 100/50 Diskus one puff twice daily.   For further details of this dictation, please see the previously-  dictated summary.   Her first INR should be drawn on August 20, 2008 with  the results to be  paged to Dr. Tharon Aquas Trigt at [phone], or his office number is [phone].      John Giovanni, P.A.-C.      Ivin Poot, M.D.  Electronically Signed    WEG/MEDQ  D:  08/18/2008  T:  08/18/2008  Job:  [id number]   cc:   Eden Lathe. Einar Gip, MD  Leslye Peer, MD

## 2011-03-15 NOTE — Op Note (Signed)
NAME:  Amy [name], Amy [name]                 ACCOUNT NO.:  [id number]   MEDICAL RECORD NO.:  [id number]          PATIENT TYPE:  INP   LOCATION:  2308                         FACILITY:  [facility]   PHYSICIAN:  Ivin Poot, M.D.  DATE OF BIRTH:  08/21/1922   DATE OF PROCEDURE:  08/06/2008  DATE OF DISCHARGE:                               OPERATIVE REPORT   OPERATION:  1. Coronary artery bypass grafting x3 (left internal mammary artery to      LAD, saphenous vein graft to ramus intermediate, and saphenous vein      graft to right coronary artery).  2. Endoscopic harvest of the left leg greater saphenous vein.   SURGEON:  Ivin Poot, MD   ASSISTANT:  John Giovanni, PA-C.   PREOPERATIVE DIAGNOSIS:  Acute myocardial infarction with severe 3-  vessel coronary disease.   POSTOPERATIVE DIAGNOSIS:  Acute myocardial infarction with severe 3-  vessel coronary disease.   ANESTHESIA:  General.   INDICATIONS:  The patient is an 77 year old female who presented 5 days  previously with an acute inferior MI with ST-segment elevation.  Emergency cardiac cath demonstrated an ulcerated plaque with thrombus in  the proximal right coronary, which was opened with a bare metal stent.  She also had a long 90% stenosis of the proximal LAD and a long 80-90%  stenosis of the ramus intermediate.  At the time of her presentation,  the plan was to treat the culprit vessel with a PCA to the right  coronary artery followed by a subsequent surgical evaluation of the 3-  vessel disease.  The patient tolerated the PCI of the right coronary  artery well and was maintained on IV Integrilin.  She is brought to  Surgery today for complete surgical revascularization.  She has remained  hemodynamically stable, and her LV function by post-PCI echo was fairly  well preserved.   Prior to surgery, I reviewed the results of the cath on several  occasions with the patient and her family.  I discussed the indications,  benefits, and risks of surgical coronary revascularization for treatment  her 3-vessel coronary artery disease.  I reviewed the alternatives to  surgical therapy as well as the major surgical issues including location  of the surgical incision, use of general anesthesia, cardiopulmonary  bypass, and expected postoperative hospital recovery.  I discussed with  her the risks of this operation including risks of MI, CVA, bleeding,  infection, and death.  She understood that she would probably need blood  transfusion therapy due to her baseline anemia and preoperative extended  Integrilin administration.  After reviewing these issues, she  demonstrated her understanding and agreed to proceed with surgery under  what I felt was an informed consent.   OPERATIVE FINDINGS:  1. Severe 3-vessel coronary artery disease, treated with CABG x3.  2. Good conduit from the left leg harvest.  3. Small but patent mammary with excellent flow.  4. Intraoperative severe anemia requiring transfusion of 3 units of      packed cells to keep the hemoglobin of above 7 grams.  PROCEDURE:  The patient was brought to the operative room and placed  supine on the operating table where general anesthesia was induced.  The  chest, abdomen, and legs were prepped with Betadine and draped as a  sterile field.  A sternal incision was made and the saphenous vein was  harvested endoscopically from the left leg.  The left internal mammary  artery was harvested as a pedicle graft from its origin at the  subclavian vessels.  It was a good vessel, but small with adequate flow.  Heparin was administered and the sternal retractor was placed.  The  pericardium was opened and suspended.  The aorta was mildly dilated, but  without severe calcification.  The LV was mildly hypertrophied, but  without scar.  Purse-strings were placed in the ascending aorta and  right atrium.  After the vein had been harvested and inspected and found   to be adequate, the patient was cannulated and placed on bypass.  The  coronaries were identified for grafting.  I decided to graft the right  coronary artery due to the presence of a recently placed bare metal  stent in the area of a ulcerated plaque to protect from any future in-  graft stenosis or thrombus, especially in the early postoperative  period.  The right coronary ramus and LAD were adequate targets.  However, the LAD was deeply intramyocardial.  Cardioplegia catheters  were placed for antegrade and retrograde cold blood cardioplegia.  The  patient was cooled to 32 degrees.  Aortic crossclamp was applied and 800  mL of cardioplegia was delivered with good cardioplegic arrest and  septal temperature dropped less than 15 degrees.  Cardioplegia was then  delivered every 15-20 minutes or less while the crossclamp was applied.   The distal coronary anastomoses were then performed.  The first distal  anastomosis was to the distal RCA.  This was a 1.5-mm vessel with a  severe proximal disease that had been treated 5 days ago with a bare  metal stent.  A reverse saphenous vein was sewn end-to-side with running  7-0 Prolene with excellent flow through the graft.  The second distal  anastomosis was the ramus branch of left coronary.  This was a 1.5-mm  vessel and proximal 80-90% stenosis.  A reverse saphenous vein was sewn  end-to-side with running 7-0 Prolene with good flow through the graft.  Cardioplegia was redosed.  The third distal anastomosis was to the mid  LAD, which was deeply intramyocardial.  The left IMA pedicle was brought  down into the area of dissection at the base of which was the LAD  vessel.  An arteriotomy was made in the LAD and the mammary artery was  sewn end-to-side with running 8-0 Prolene.  There was a good flow  through the anastomosis after briefly releasing the pedicle bulldog on  the mammary artery.  The bulldog was reapplied and the pedicle was  secured  to the epicardium.  Cardioplegia was redosed.   While the crossclamp was still in place, 2 proximal vein anastomoses  were performed using a 4.0-mm punch running 7-0 Prolene.  Prior to tying  down the final proximal anastomosis, air was vented from the coronaries  with a dose of retrograde warm blood cardioplegia.  Cross clamp was then  removed.   The heart resumed a spontaneous rhythm.  The cardioplegia catheters were  removed.  Air was aspirated from the vein grafts with a 27 gauge needle.  Vein grafts were opened  and each had good flow.  Hemostasis was  documented at the proximal and distal anastomoses.  The patient was  rewarmed and reperfused to 37 degrees.  Temporary pacing wires were  applied.  The lungs were expanded and the ventilator was resumed.  The  patient was then weaned from bypass without inotropes without  difficulty.  Blood pressure were stable off bypass.  Protamine was  administered without adverse reaction.  The cannula was removed.  The  mediastinum was re-irrigated with warm antibiotic irrigation.  The leg  incision was irrigated and closed in a standard fashion.  The superior  pericardial fat was closed over the aorta.  Two mediastinal and a left  pleural chest tubes were placed and brought out through separate  incisions.  The sternum was closed with interrupted steel wire.  The  pectoralis fascia was closed with a running #1 Vicryl.  The subcutaneous  and skin layers were closed with running Vicryl and sterile dressings  were applied.  Total bypass time was 110 minutes.      Ivin Poot, M.D.  Electronically Signed     PV/MEDQ  D:  08/06/2008  T:  08/07/2008  Job:  [id number]   cc:   Eden Lathe. Einar Gip, MD

## 2011-03-15 NOTE — Assessment & Plan Note (Signed)
[facility]                             PULMONARY OFFICE NOTE   NAME:Amy [name], Amy [name]                        MRN:          [id number]  DATE:05/22/2007                            DOB:          23-Mar-1922    HISTORY:  An 77 year old white female with very mild airflow obstruction  documented by previous PFTs in February 2007, with a clinical history  suggesting asthma that has been totally eliminated with the use of  Advair 100/50 b.i.d. She is short of breath with exertion, but denies  any variability or nocturnal wheeze or cough, fevers, chills, sweats,  chest pain or leg swelling.   For full inventory of medications, please see face sheet dated May 22, 2007, which was reviewed with the patient in detail and is correct as  listed.   PHYSICAL EXAMINATION:  She is a pleasant, ambulatory, white female in no  acute distress. She has stable vital signs.  HEENT: Is unremarkable. Oropharynx is clear.  LUNGS: Lung fields are completely clear bilaterally to auscultation and  percussion.  HEART: Regular rate and rhythm without murmur, gallop or rub.  ABDOMEN: Soft, benign.  EXTREMITIES: Warm without calf tenderness, cyanosis, clubbing or edema.   IMPRESSION:  This patient only has very mild asthma and this has  occurred in the setting of overt reflux for which she has been  maintained on PPIs as well. It may well be that reflux is to asthma in  elderly adults what allergies are to asthma in younger children, but in  any case, she appears to have enough of both reflux and asthma to treat  her chronically with both PPI therapy and Advair at low doses.   I made sure she could use this effectively and reviewed with her that if  she begins to have more breakthrough symptoms despite treatment with  both PPI and Advair that I would like to see her back here in  [place]. Otherwise, she can certainly refill her Advair through Dr.  Hulen Luster office.     Christena Deem. Melvyn Novas, MD, [facility]  Electronically Signed    MBW/MedQ  DD: 05/22/2007  DT: 05/22/2007  Job #: [id number]   cc:   Bonne Dolores, [name].D.  Leslye Peer, MD

## 2011-03-18 NOTE — Cardiovascular Report (Signed)
NAME:  Gallaher, [place]                 ACCOUNT NO.:  [id number]   MEDICAL RECORD NO.:  [id number]          PATIENT TYPE:  OIB   LOCATION:  2899                         FACILITY:  [facility]   PHYSICIAN:  Octavia Heir, MD  DATE OF BIRTH:  05/16/1922   DATE OF PROCEDURE:  07/21/2005  DATE OF DISCHARGE:  07/21/2005                              CARDIAC CATHETERIZATION   PROCEDURE PERFORMED:  Head up tilt table.   CARDIOLOGIST:  Octavia Heir, M.D.   COMPLICATIONS:  None.   INDICATIONS:  Ms. Napora is an 77 year old female, patient of Dr. Eddie Candle  and Dr. Odette Fraction with a history of hypertension, hyperlipidemia,  history of dizziness and syncope with three falls over the last one year.  She has had a 2-D echocardiogram, which revealed a normal left ventricular  size and EF with no significant valvular disease.  She also had a Cardiolite  scan, which revealed no significant ischemia and a normal EF; and, a Holter  revealed no significant arrhythmia.  She is now here for tilt table testing  to rule out near cardiogenic __________ .   DESCRIPTION OF PROCEDURE:  After obtaining informed written consent the  patient was brought to the cardiac cath lab where heart rate and BP were  measured, and she was placed in a supine position with the patient on  hemodynamics.  Resting heart rate was 65 with a resting blood pressure of  165/82.  The patient was monitored for approximately five minutes and then  tilted to the 70-degree heads up position.  She remained hemodynamically  stable with the only complaint of mild nausea, and with no significant  change in heart rate or blood pressure.  After 45 minutes she was returned  to the supine position with no significant change in hemodynamics.   The patient was then taken off the table and taken to the recovery room in  stable condition.   CONCLUSION:  Negative heads up tilt table testing.      Octavia Heir, MD  Electronically  Signed     RHM/MEDQ  D:  07/21/2005  T:  07/21/2005  Job:  [id number]   cc:   Halford Chessman, M.D.  Fax: [phone]   Leslye Peer, MD  Fax: [phone]

## 2011-03-18 NOTE — Op Note (Signed)
NAME:  Amy [name], Amy [name]                 ACCOUNT NO.:  [id number]   MEDICAL RECORD NO.:  [id number]          PATIENT TYPE:  OIB   LOCATION:  2899                         FACILITY:  [facility]   PHYSICIAN:  Octavia Heir, MD  DATE OF BIRTH:  Mar 04, 1922   DATE OF PROCEDURE:  02/21/2007  DATE OF DISCHARGE:                               OPERATIVE REPORT   PROCEDURE:  Insertion of a [employer] Reveal DX loop recorder model  [id number], serial [id number].   SURGEON:  Octavia Heir, M.D.   COMPLICATIONS:  None.   INDICATION:  Ms. Tines is an 77 year old female, patient of Dr. Odette Fraction, Dr. Eddie Candle, with a history of hypertension,  hyperlipidemia, history of recurrent syncope over the last 1-2 years.  She has no antecedent symptoms prior to these episodes.  She has some  extensive workup through Dr. Martie Round office with no found etiology.  She is now brought for loop recorder insertion in an attempt to capture  any significant arrhythmias since possible etiology of her syncope.   DESCRIPTION OF PROCEDURE:  After obtaining informed consent, the patient  was brought into the cardiac catheterization laboratory where the left  chest was prepped and draped in the sterile fashion.  ECG monitoring was  established, 1% lidocaine used to anesthetize approximately 2 cm below  the left clavicle and 2 cm lateral to the sternum.  Next, approximately  1.5 cm incision was then carried out in a horizontal fashion and  hemostasis was obtained with electrocautery.  Following this, blunt  dissection was used to carry this down to the left pectoral fascia and  approximately 1 x 3 cm pocket was then created over the left pectoral  fascia.  Hemostasis was again confirmed with electrocautery.  The 1%  kanamycin solution was used to irrigate the pocket and again hemostasis  was confirmed.  The 2-0 silk sutures were then placed at the superior  aspect of the pocket and anchored to the pectoral fascia.   Following  this, a Reveal DX loop recorder model [id number], serial [id number] was then  inserted into the pocket and the header was secured to 2-0 silk sutures.  The subcutaneous layers were then closed using 2-0 Vicryl.  The skin is  then closed with 4-0 Vicryl.  Steri-Strips were applied.  The patient  transferred to the recovery room in stable condition.   CONCLUSIONS:  Successful implant of a Reveal DX loop recorder model  #[id number], serial [id number].      Octavia Heir, MD  Electronically Signed     RHM/MEDQ  D:  02/21/2007  T:  02/21/2007  Job:  [id number]   cc:   Odette Fraction, MD  Halford Chessman, M.D.

## 2011-08-01 LAB — BASIC METABOLIC PANEL
BUN: 11
BUN: 18
BUN: 26 — ABNORMAL HIGH
CO2: 26
CO2: 26
CO2: 28
Calcium: 7.9 — ABNORMAL LOW
Calcium: 8.2 — ABNORMAL LOW
Calcium: 8.5
Chloride: 101
Chloride: 103
Chloride: 103
Creatinine, Ser: 0.98
Creatinine, Ser: 1.02
Creatinine, Ser: 1.26 — ABNORMAL HIGH
GFR calc Af Amer: 49 — ABNORMAL LOW
GFR calc Af Amer: >60
GFR calc non Af Amer: 34 — ABNORMAL LOW
GFR calc non Af Amer: 54 — ABNORMAL LOW
Glucose, Bld: 106 — ABNORMAL HIGH
Glucose, Bld: 140 — ABNORMAL HIGH
Glucose, Bld: 98
Glucose, Bld: 99
Potassium: 4
Potassium: 4.7

## 2011-08-01 LAB — CBC
HCT: 30.6 — ABNORMAL LOW
HCT: 35 — ABNORMAL LOW
HCT: 40.4
HCT: 31.1 — ABNORMAL LOW
Hemoglobin: 10.4 — ABNORMAL LOW
Hemoglobin: 13.6
MCHC: 33.9
MCHC: 34.1
MCHC: 34.2
MCHC: 34.3
MCV: 86.6
MCV: 87.5
MCV: 87.8
MCV: 87.7
Platelets: 169
Platelets: 177
Platelets: 196
Platelets: 455 — ABNORMAL HIGH
RBC: 2.94 — ABNORMAL LOW
RBC: 3.99
RDW: 14
RDW: 14.2
RDW: 14.3
RDW: 14.5
RDW: 14.6
RDW: 14.5
WBC: 6.9

## 2011-08-01 LAB — COMPREHENSIVE METABOLIC PANEL
Albumin: 4
BUN: 18
CO2: 28
Chloride: 100
Creatinine, Ser: 1.4 — ABNORMAL HIGH
GFR calc non Af Amer: 36 — ABNORMAL LOW
Glucose, Bld: 117 — ABNORMAL HIGH
Total Bilirubin: 0.9

## 2011-08-01 LAB — DIFFERENTIAL
Basophils Absolute: 0.1
Basophils Absolute: 0.1
Basophils Relative: 1
Basophils Relative: 1
Eosinophils Absolute: 0.2
Eosinophils Relative: 2
Lymphocytes Relative: 29
Neutro Abs: 4.2
Neutrophils Relative %: 61

## 2011-08-01 LAB — PROTIME-INR
INR: 0.9
INR: 1.5
Prothrombin Time: 12.2
Prothrombin Time: 18.4 — ABNORMAL HIGH
Prothrombin Time: 20.9 — ABNORMAL HIGH
Prothrombin Time: 23.7 — ABNORMAL HIGH
Prothrombin Time: 31.7 — ABNORMAL HIGH

## 2011-08-01 LAB — URINALYSIS, ROUTINE W REFLEX MICROSCOPIC
Bilirubin Urine: NEGATIVE
Ketones, ur: NEGATIVE
Nitrite: NEGATIVE
Protein, ur: NEGATIVE

## 2011-08-01 LAB — POCT I-STAT, CHEM 8
Calcium, Ion: 1.09 — ABNORMAL LOW
Chloride: 102
Glucose, Bld: 133 — ABNORMAL HIGH
HCT: 32 — ABNORMAL LOW
Hemoglobin: 10.9 — ABNORMAL LOW
TCO2: 26

## 2011-08-01 LAB — CROSSMATCH

## 2011-08-01 LAB — APTT: aPTT: 25

## 2011-08-01 LAB — URINE CULTURE: Colony Count: 3000

## 2011-08-01 LAB — TYPE AND SCREEN

## 2011-08-01 LAB — ABO/RH: ABO/RH(D): O NEG

## 2011-08-02 LAB — CBC
HCT: 24.9 — ABNORMAL LOW
HCT: 25.9 — ABNORMAL LOW
HCT: 26.4 — ABNORMAL LOW
HCT: 28.2 — ABNORMAL LOW
HCT: 28.3 — ABNORMAL LOW
HCT: 28.5 — ABNORMAL LOW
HCT: 28.6 — ABNORMAL LOW
HCT: 28.6 — ABNORMAL LOW
HCT: 30.4 — ABNORMAL LOW
HCT: 30.7 — ABNORMAL LOW
HCT: 31.7 — ABNORMAL LOW
HCT: 34.9 — ABNORMAL LOW
Hemoglobin: 10.2 — ABNORMAL LOW
Hemoglobin: 10.4 — ABNORMAL LOW
Hemoglobin: 10.6 — ABNORMAL LOW
Hemoglobin: 11.8 — ABNORMAL LOW
Hemoglobin: 8.2 — ABNORMAL LOW
Hemoglobin: 8.6 — ABNORMAL LOW
Hemoglobin: 9 — ABNORMAL LOW
Hemoglobin: 9.4 — ABNORMAL LOW
Hemoglobin: 9.5 — ABNORMAL LOW
Hemoglobin: 9.5 — ABNORMAL LOW
Hemoglobin: 9.6 — ABNORMAL LOW
Hemoglobin: 9.6 — ABNORMAL LOW
MCHC: 33.2
MCHC: 33.3
MCHC: 33.3
MCHC: 33.4
MCHC: 33.5
MCHC: 33.6
MCHC: 33.7
MCHC: 33.7
MCHC: 33.7
MCHC: 33.7
MCHC: 33.8
MCV: 87.1
MCV: 87.1
MCV: 87.2
MCV: 87.8
MCV: 87.8
MCV: 87.9
MCV: 87.9
MCV: 88.3
MCV: 88.3
MCV: 88.7
Platelets: 143 — ABNORMAL LOW
Platelets: 157
Platelets: 158
Platelets: 168
Platelets: 170
Platelets: 171
Platelets: 211
Platelets: 300
Platelets: 323
Platelets: 333
Platelets: 354
Platelets: 424 — ABNORMAL HIGH
RBC: 2.81 — ABNORMAL LOW
RBC: 2.94 — ABNORMAL LOW
RBC: 2.98 — ABNORMAL LOW
RBC: 3.21 — ABNORMAL LOW
RBC: 3.21 — ABNORMAL LOW
RBC: 3.22 — ABNORMAL LOW
RBC: 3.24 — ABNORMAL LOW
RBC: 3.28 — ABNORMAL LOW
RBC: 3.48 — ABNORMAL LOW
RBC: 3.5 — ABNORMAL LOW
RBC: 3.64 — ABNORMAL LOW
RBC: 3.67 — ABNORMAL LOW
RBC: 3.97
RDW: 14.5
RDW: 14.5
RDW: 14.6
RDW: 14.6
RDW: 14.7
RDW: 14.8
RDW: 14.9
RDW: 14.9
RDW: 14.9
RDW: 15.1
RDW: 15.1
RDW: 15.2
RDW: 15.3
WBC: 5.1
WBC: 5.8
WBC: 6.2
WBC: 6.2
WBC: 6.5
WBC: 7.1
WBC: 7.9
WBC: 8.5
WBC: 8.9
WBC: 9
WBC: 9.5
WBC: 9.5
WBC: 9.9

## 2011-08-02 LAB — PROTIME-INR
INR: 1.1
INR: 1.3
INR: 1.3
INR: 1.5
INR: 1.7 — ABNORMAL HIGH
INR: 1.7 — ABNORMAL HIGH
INR: 1.8 — ABNORMAL HIGH
INR: 1.8 — ABNORMAL HIGH
INR: 2.1 — ABNORMAL HIGH
INR: 2.1 — ABNORMAL HIGH
INR: 2.3 — ABNORMAL HIGH
INR: 2.5 — ABNORMAL HIGH
INR: 2.5 — ABNORMAL HIGH
INR: 2.5 — ABNORMAL HIGH
INR: 2.6 — ABNORMAL HIGH
INR: 2.8 — ABNORMAL HIGH
INR: 3.9 — ABNORMAL HIGH
Prothrombin Time: 15
Prothrombin Time: 16.3 — ABNORMAL HIGH
Prothrombin Time: 16.3 — ABNORMAL HIGH
Prothrombin Time: 18.8 — ABNORMAL HIGH
Prothrombin Time: 20.7 — ABNORMAL HIGH
Prothrombin Time: 21.3 — ABNORMAL HIGH
Prothrombin Time: 21.6 — ABNORMAL HIGH
Prothrombin Time: 24.4 — ABNORMAL HIGH
Prothrombin Time: 24.8 — ABNORMAL HIGH
Prothrombin Time: 26.3 — ABNORMAL HIGH
Prothrombin Time: 28.4 — ABNORMAL HIGH
Prothrombin Time: 29 — ABNORMAL HIGH
Prothrombin Time: 29.1 — ABNORMAL HIGH
Prothrombin Time: 30 — ABNORMAL HIGH
Prothrombin Time: 31.7 — ABNORMAL HIGH

## 2011-08-02 LAB — CARDIAC PANEL(CRET KIN+CKTOT+MB+TROPI)
CK, MB: 5.8 — ABNORMAL HIGH
CK, MB: 6.6 — ABNORMAL HIGH
Relative Index: INVALID
Total CK: 70
Total CK: 85
Total CK: 98
Troponin I: 1.51

## 2011-08-02 LAB — CREATININE, SERUM
Creatinine, Ser: 0.65
Creatinine, Ser: 1
GFR calc Af Amer: >60
GFR calc Af Amer: >60
GFR calc non Af Amer: 53 — ABNORMAL LOW
GFR calc non Af Amer: >60

## 2011-08-02 LAB — CROSSMATCH
ABO/RH(D): O NEG
Antibody Screen: NEGATIVE

## 2011-08-02 LAB — POCT I-STAT, CHEM 8
BUN: 13
Calcium, Ion: 1.13
Calcium, Ion: 1.15
Chloride: 101
Chloride: 99
Creatinine, Ser: 0.9
Glucose, Bld: 110 — ABNORMAL HIGH
Glucose, Bld: 136 — ABNORMAL HIGH
HCT: 28 — ABNORMAL LOW
HCT: 30 — ABNORMAL LOW
Hemoglobin: 10.2 — ABNORMAL LOW
Potassium: 4
Sodium: 138
TCO2: 25
TCO2: 26

## 2011-08-02 LAB — URINE MICROSCOPIC-ADD ON

## 2011-08-02 LAB — URINALYSIS, ROUTINE W REFLEX MICROSCOPIC
Bilirubin Urine: NEGATIVE
Bilirubin Urine: NEGATIVE
Glucose, UA: NEGATIVE
Glucose, UA: NEGATIVE
Hgb urine dipstick: NEGATIVE
Ketones, ur: NEGATIVE
Ketones, ur: NEGATIVE
Nitrite: NEGATIVE
Nitrite: POSITIVE — AB
Protein, ur: NEGATIVE
Protein, ur: NEGATIVE
Specific Gravity, Urine: 1.008
Specific Gravity, Urine: 1.017
Urobilinogen, UA: 0.2
Urobilinogen, UA: 1
pH: 5
pH: 6.5

## 2011-08-02 LAB — BASIC METABOLIC PANEL
BUN: 12
BUN: 13
BUN: 13
BUN: 14
BUN: 17
BUN: 20
BUN: 20
BUN: 20
BUN: 20
BUN: 22
CO2: 26
CO2: 28
CO2: 28
CO2: 29
CO2: 29
CO2: 30
CO2: 30
CO2: 31
CO2: 31
CO2: 31
CO2: 35 — ABNORMAL HIGH
Calcium: 8 — ABNORMAL LOW
Calcium: 8.1 — ABNORMAL LOW
Calcium: 8.2 — ABNORMAL LOW
Calcium: 8.4
Calcium: 8.8
Calcium: 9
Calcium: 9.1
Calcium: 9.2
Calcium: 9.2
Calcium: 9.3
Chloride: 100
Chloride: 102
Chloride: 103
Chloride: 103
Chloride: 103
Chloride: 96
Chloride: 97
Chloride: 97
Chloride: 98
Chloride: 99
Creatinine, Ser: 0.71
Creatinine, Ser: 0.83
Creatinine, Ser: 0.89
Creatinine, Ser: 0.92
Creatinine, Ser: 0.95
Creatinine, Ser: 0.97
Creatinine, Ser: 0.98
Creatinine, Ser: 1.01
Creatinine, Ser: 1.01
Creatinine, Ser: 1.05
Creatinine, Ser: 1.06
GFR calc Af Amer: 60 — ABNORMAL LOW
GFR calc Af Amer: >60
GFR calc Af Amer: >60
GFR calc Af Amer: >60
GFR calc Af Amer: >60
GFR calc Af Amer: >60
GFR calc Af Amer: >60
GFR calc Af Amer: >60
GFR calc Af Amer: >60
GFR calc Af Amer: >60
GFR calc non Af Amer: 49 — ABNORMAL LOW
GFR calc non Af Amer: 50 — ABNORMAL LOW
GFR calc non Af Amer: 52 — ABNORMAL LOW
GFR calc non Af Amer: 54 — ABNORMAL LOW
GFR calc non Af Amer: 56 — ABNORMAL LOW
GFR calc non Af Amer: 58 — ABNORMAL LOW
GFR calc non Af Amer: >60
GFR calc non Af Amer: >60
GFR calc non Af Amer: >60
Glucose, Bld: 105 — ABNORMAL HIGH
Glucose, Bld: 105 — ABNORMAL HIGH
Glucose, Bld: 113 — ABNORMAL HIGH
Glucose, Bld: 115 — ABNORMAL HIGH
Glucose, Bld: 118 — ABNORMAL HIGH
Glucose, Bld: 75
Glucose, Bld: 83
Glucose, Bld: 86
Glucose, Bld: 94
Glucose, Bld: 97
Glucose, Bld: 98
Potassium: 3.4 — ABNORMAL LOW
Potassium: 3.6
Potassium: 3.7
Potassium: 3.9
Potassium: 3.9
Potassium: 4
Potassium: 4.1
Potassium: 4.1
Potassium: 4.1
Potassium: 4.2
Sodium: 133 — ABNORMAL LOW
Sodium: 134 — ABNORMAL LOW
Sodium: 135
Sodium: 135
Sodium: 136
Sodium: 136
Sodium: 137
Sodium: 137
Sodium: 138

## 2011-08-02 LAB — POCT I-STAT 3, ART BLOOD GAS (G3+)
Acid-Base Excess: 3 — ABNORMAL HIGH
Acid-Base Excess: 7 — ABNORMAL HIGH
Acid-base deficit: 1
Bicarbonate: 23.6
Bicarbonate: 25.6 — ABNORMAL HIGH
Bicarbonate: 25.7 — ABNORMAL HIGH
Bicarbonate: 26.7 — ABNORMAL HIGH
Bicarbonate: 26.9 — ABNORMAL HIGH
Bicarbonate: 29.2 — ABNORMAL HIGH
O2 Saturation: 100
O2 Saturation: 100
O2 Saturation: 100
O2 Saturation: 98
O2 Saturation: 99
Patient temperature: 35.2
Patient temperature: 37
Patient temperature: 98.7
TCO2: 25
TCO2: 27
TCO2: 27
TCO2: 28
TCO2: 28
TCO2: 30
pCO2 arterial: 32 — ABNORMAL LOW
pCO2 arterial: 35.7
pCO2 arterial: 35.8
pCO2 arterial: 42.8
pCO2 arterial: 47.2 — ABNORMAL HIGH
pH, Arterial: 7.343 — ABNORMAL LOW
pH, Arterial: 7.385
pH, Arterial: 7.397
pH, Arterial: 7.427 — ABNORMAL HIGH
pH, Arterial: 7.477 — ABNORMAL HIGH
pH, Arterial: 7.567 — ABNORMAL HIGH
pO2, Arterial: 107 — ABNORMAL HIGH
pO2, Arterial: 121 — ABNORMAL HIGH
pO2, Arterial: 177 — ABNORMAL HIGH
pO2, Arterial: 322 — ABNORMAL HIGH
pO2, Arterial: 388 — ABNORMAL HIGH

## 2011-08-02 LAB — COMPREHENSIVE METABOLIC PANEL
ALT: 17
AST: 25
AST: 25
Albumin: 2.1 — ABNORMAL LOW
Albumin: 2.2 — ABNORMAL LOW
Alkaline Phosphatase: 143 — ABNORMAL HIGH
Alkaline Phosphatase: 153 — ABNORMAL HIGH
BUN: 15
BUN: 19
CO2: 28
Calcium: 8.5
Chloride: 104
Chloride: 104
Creatinine, Ser: 0.97
GFR calc Af Amer: >60
GFR calc Af Amer: >60
GFR calc non Af Amer: 55 — ABNORMAL LOW
Glucose, Bld: 101 — ABNORMAL HIGH
Potassium: 3.8
Potassium: 3.9
Sodium: 138
Total Bilirubin: 0.7
Total Bilirubin: 0.9
Total Protein: 4.6 — ABNORMAL LOW
Total Protein: 4.8 — ABNORMAL LOW

## 2011-08-02 LAB — BLOOD GAS, ARTERIAL
Acid-Base Excess: 2.4 — ABNORMAL HIGH
Bicarbonate: 26.5 — ABNORMAL HIGH
FIO2: 0.21
O2 Saturation: 94.5
Patient temperature: 98.6
TCO2: 27.8
pCO2 arterial: 41.9
pH, Arterial: 7.418 — ABNORMAL HIGH
pO2, Arterial: 69.9 — ABNORMAL LOW

## 2011-08-02 LAB — GLUCOSE, CAPILLARY
Glucose-Capillary: 101 — ABNORMAL HIGH
Glucose-Capillary: 103 — ABNORMAL HIGH
Glucose-Capillary: 106 — ABNORMAL HIGH
Glucose-Capillary: 107 — ABNORMAL HIGH
Glucose-Capillary: 107 — ABNORMAL HIGH
Glucose-Capillary: 110 — ABNORMAL HIGH
Glucose-Capillary: 112 — ABNORMAL HIGH
Glucose-Capillary: 113 — ABNORMAL HIGH
Glucose-Capillary: 124 — ABNORMAL HIGH
Glucose-Capillary: 125 — ABNORMAL HIGH
Glucose-Capillary: 137 — ABNORMAL HIGH
Glucose-Capillary: 141 — ABNORMAL HIGH
Glucose-Capillary: 147 — ABNORMAL HIGH
Glucose-Capillary: 77
Glucose-Capillary: 93
Glucose-Capillary: 94
Glucose-Capillary: 94
Glucose-Capillary: 99
Glucose-Capillary: 99

## 2011-08-02 LAB — POCT I-STAT 3, VENOUS BLOOD GAS (G3P V)
Acid-base deficit: 3 — ABNORMAL HIGH
Bicarbonate: 24.2 — ABNORMAL HIGH
O2 Saturation: 86
TCO2: 26
pCO2, Ven: 54.9 — ABNORMAL HIGH
pH, Ven: 7.252
pO2, Ven: 61 — ABNORMAL HIGH

## 2011-08-02 LAB — POCT I-STAT 4, (NA,K, GLUC, HGB,HCT)
Glucose, Bld: 100 — ABNORMAL HIGH
Glucose, Bld: 103 — ABNORMAL HIGH
Glucose, Bld: 103 — ABNORMAL HIGH
Glucose, Bld: 109 — ABNORMAL HIGH
Glucose, Bld: 115 — ABNORMAL HIGH
HCT: 19 — ABNORMAL LOW
HCT: 20 — ABNORMAL LOW
HCT: 22 — ABNORMAL LOW
HCT: 22 — ABNORMAL LOW
HCT: 33 — ABNORMAL LOW
Hemoglobin: 11.2 — ABNORMAL LOW
Hemoglobin: 6.5 — CL
Hemoglobin: 6.8 — CL
Hemoglobin: 7.1 — CL
Hemoglobin: 7.5 — CL
Hemoglobin: 8.8 — ABNORMAL LOW
Potassium: 3.4 — ABNORMAL LOW
Potassium: 3.9
Potassium: 4
Potassium: 4.1
Potassium: 4.7
Sodium: 128 — ABNORMAL LOW
Sodium: 129 — ABNORMAL LOW
Sodium: 135
Sodium: 135
Sodium: 135
Sodium: 137

## 2011-08-02 LAB — PREPARE FRESH FROZEN PLASMA

## 2011-08-02 LAB — OCCULT BLOOD X 1 CARD TO LAB, STOOL: Fecal Occult Bld: NEGATIVE

## 2011-08-02 LAB — HEMOGLOBIN A1C
Hgb A1c MFr Bld: 5.5
Mean Plasma Glucose: 111

## 2011-08-02 LAB — URINE CULTURE
Colony Count: >=100000
Special Requests: NEGATIVE

## 2011-08-02 LAB — CK TOTAL AND CKMB (NOT AT ARMC)
CK, MB: 1.8
Total CK: 46

## 2011-08-02 LAB — PREPARE PLATELET PHERESIS

## 2011-08-02 LAB — MAGNESIUM
Magnesium: 2.4
Magnesium: 2.4
Magnesium: 2.8 — ABNORMAL HIGH

## 2011-08-02 LAB — TYPE AND SCREEN
ABO/RH(D): O NEG
Antibody Screen: NEGATIVE

## 2011-08-02 LAB — APTT
aPTT: 32
aPTT: 95 — ABNORMAL HIGH
aPTT: >200

## 2011-08-02 LAB — LIPID PANEL
Cholesterol: 201 — ABNORMAL HIGH
HDL: 29 — ABNORMAL LOW
LDL Cholesterol: 139 — ABNORMAL HIGH
Triglycerides: 163 — ABNORMAL HIGH

## 2011-08-02 LAB — HEPARIN LEVEL (UNFRACTIONATED): Heparin Unfractionated: <0.1 — ABNORMAL LOW

## 2011-08-02 LAB — PREPARE RBC (CROSSMATCH)

## 2011-08-02 LAB — PLATELET COUNT: Platelets: 164

## 2011-08-06 ENCOUNTER — Emergency Department (HOSPITAL_COMMUNITY)
Admission: EM | Admit: 2011-08-06 | Discharge: 2011-08-06 | Disposition: A | Discharge status: Home / Self Care | Payer: Medicare Other | Attending: Emergency Medicine | Admitting: Emergency Medicine

## 2011-08-06 ENCOUNTER — Encounter (HOSPITAL_COMMUNITY): Payer: Self-pay | Admitting: Emergency Medicine

## 2011-08-06 ENCOUNTER — Emergency Department (HOSPITAL_COMMUNITY): Payer: Medicare Other

## 2011-08-06 DIAGNOSIS — M79609 Pain in unspecified limb: Secondary | ICD-10-CM | POA: Insufficient documentation

## 2011-08-06 DIAGNOSIS — W010XXA Fall on same level from slipping, tripping and stumbling without subsequent striking against object, initial encounter: Secondary | ICD-10-CM | POA: Insufficient documentation

## 2011-08-06 DIAGNOSIS — S92309A Fracture of unspecified metatarsal bone(s), unspecified foot, initial encounter for closed fracture: Secondary | ICD-10-CM | POA: Insufficient documentation

## 2011-08-06 HISTORY — DX: Pure hypercholesterolemia, unspecified: E78.00

## 2011-08-06 MED ORDER — TRAMADOL HCL 50 MG PO TABS: 50.0000 mg | ORAL_TABLET | Freq: Four times a day (QID) | ORAL | Status: AC | PRN

## 2011-08-06 NOTE — ED Notes (Signed)
Pt c/o rt foot pain after falling last night.

## 2011-08-06 NOTE — ED Provider Notes (Signed)
Medical screening examination/treatment/procedure(s) were conducted as a shared visit with non-physician practitioner(s) and myself.  I personally evaluated the patient during the encounter.  Tender proximal right fifth metatarsal. X-ray shows fracture of same. Nondisplaced.  Nat Christen, MD 08/06/11 1323

## 2011-08-06 NOTE — ED Provider Notes (Signed)
History     CSN: [id number] Arrival date & time: 08/06/2011  9:30 AM  Chief Complaint  Patient presents with  . Foot Pain    (Consider location/radiation/quality/duration/timing/severity/associated sxs/prior treatment) Patient is a 77 y.o. female presenting with lower extremity pain. The history is provided by the patient.  Foot Pain This is a new problem. The current episode started yesterday. The problem occurs constantly. The problem has been unchanged. Associated symptoms include arthralgias. Pertinent negatives include no abdominal pain, chest pain, congestion, fever, headaches, joint swelling, nausea, neck pain, numbness, rash, sore throat or weakness. Associated symptoms comments: Patient tripped yesterday on an acorn walking back from her mailbox,  Causing her to stumble and fall.  She has persistent pain in her right lateral foot.  She denies hitting her head.. The symptoms are aggravated by walking and standing. She has tried rest for the symptoms. Improvement on treatment: She gets moderte relief at rest.  She does have a walker at home.    Past Medical History  Diagnosis Date  . Hypertension   . Hypercholesteremia     Past Surgical History  Procedure Date  . Cardiac surgery   . Total hip arthroplasty     History reviewed. No pertinent family history.  History  Substance Use Topics  . Smoking status: Not on file  . Smokeless tobacco: Not on file  . Alcohol Use: No    OB History    Grav Para Term Preterm Abortions TAB SAB Ect Mult Living                  Review of Systems  Constitutional: Negative for fever.  HENT: Negative for congestion, sore throat and neck pain.   Eyes: Negative.   Respiratory: Negative for chest tightness and shortness of breath.   Cardiovascular: Negative for chest pain.  Gastrointestinal: Negative for nausea and abdominal pain.  Genitourinary: Negative.   Musculoskeletal: Positive for arthralgias. Negative for joint swelling.    Skin: Negative.  Negative for rash and wound.  Neurological: Negative for dizziness, weakness, light-headedness, numbness and headaches.  Hematological: Negative.   Psychiatric/Behavioral: Negative.     Allergies  Hydrocodone-acetaminophen  Home Medications  No current outpatient prescriptions on file.  BP 147/62  Pulse 69  Temp(Src) 97.9 F (36.6 C) (Oral)  Resp 18  Ht 5' 5.5" (1.664 m)  Wt 160 lb (72.576 kg)  BMI 26.22 kg/m2  SpO2 95%  Physical Exam  Nursing note and vitals reviewed. Constitutional: She is oriented to person, place, and time. She appears well-developed and well-nourished.  HENT:  Head: Normocephalic and atraumatic.  Eyes: Conjunctivae are normal.  Neck: Normal range of motion.  Cardiovascular: Normal rate, regular rhythm, normal heart sounds and intact distal pulses.   Pulmonary/Chest: Effort normal and breath sounds normal. She has no wheezes.  Abdominal: Soft. Bowel sounds are normal. There is no tenderness.  Musculoskeletal: She exhibits tenderness. She exhibits no edema.       Right foot: She exhibits bony tenderness and swelling. She exhibits normal capillary refill and no deformity.       Feet:  Neurological: She is alert and oriented to person, place, and time.  Skin: Skin is warm and dry.  Psychiatric: She has a normal mood and affect.    ED Course  Procedures (including critical care time)  Labs Reviewed - No data to display Dg Ankle Complete Right  08/06/2011  *RADIOLOGY REPORT*  Clinical Data: 77 year old female with pain following injury.  RIGHT ANKLE -  COMPLETE 3+ VIEW  Comparison: 07/22/2008.  Findings: Calcaneus appears intact.  Increased degenerative spurring at the plantar surface.  Mortise joint alignment preserved.  Osteopenia.  Talar dome intact.  No acute fracture identified.  IMPRESSION: No acute fracture or dislocation identified about the right ankle.  Original Report Authenticated By: Randall An, M.D.   Dg Foot Complete  Right  08/06/2011  *RADIOLOGY REPORT*  Clinical Data: 77 year old female.  Pain after injury.  RIGHT FOOT COMPLETE - 3+ VIEW  Comparison: 07/22/2008.  Findings: Nondisplaced fracture at the base of the right fifth metatarsal.  Osteopenia.  Tarsal bones and calcaneus appear intact. No other acute fracture identified.  IMPRESSION: Nondisplaced fracture at the lateral base of the right fifth metatarsal.  Original Report Authenticated By: Randall An, M.D.     No diagnosis found.    MDM  Proximal mt fracture, non displaced.  Walker (patient has),  Firm soled shoe for support.         [place] Reek, PA 08/06/11 [place], PA 08/06/11 [place], PA 08/06/11 1051

## 2011-11-07 DIAGNOSIS — Z951 Presence of aortocoronary bypass graft: Secondary | ICD-10-CM | POA: Diagnosis not present

## 2011-11-07 DIAGNOSIS — E782 Mixed hyperlipidemia: Secondary | ICD-10-CM | POA: Diagnosis not present

## 2011-11-07 DIAGNOSIS — I10 Essential (primary) hypertension: Secondary | ICD-10-CM | POA: Diagnosis not present

## 2011-11-07 DIAGNOSIS — Z45018 Encounter for adjustment and management of other part of cardiac pacemaker: Secondary | ICD-10-CM | POA: Diagnosis not present

## 2011-11-08 DIAGNOSIS — E782 Mixed hyperlipidemia: Secondary | ICD-10-CM | POA: Diagnosis not present

## 2011-11-08 DIAGNOSIS — Z79899 Other long term (current) drug therapy: Secondary | ICD-10-CM | POA: Diagnosis not present

## 2011-12-06 ENCOUNTER — Other Ambulatory Visit (HOSPITAL_COMMUNITY): Payer: Self-pay | Admitting: Family Medicine

## 2011-12-06 DIAGNOSIS — Z139 Encounter for screening, unspecified: Secondary | ICD-10-CM

## 2011-12-06 DIAGNOSIS — I10 Essential (primary) hypertension: Secondary | ICD-10-CM | POA: Diagnosis not present

## 2011-12-06 DIAGNOSIS — M109 Gout, unspecified: Secondary | ICD-10-CM | POA: Diagnosis not present

## 2011-12-06 DIAGNOSIS — Z6825 Body mass index (BMI) 25.0-25.9, adult: Secondary | ICD-10-CM | POA: Diagnosis not present

## 2011-12-15 DIAGNOSIS — Z6825 Body mass index (BMI) 25.0-25.9, adult: Secondary | ICD-10-CM | POA: Diagnosis not present

## 2011-12-15 DIAGNOSIS — J069 Acute upper respiratory infection, unspecified: Secondary | ICD-10-CM | POA: Diagnosis not present

## 2012-01-03 ENCOUNTER — Ambulatory Visit (HOSPITAL_COMMUNITY)
Admission: RE | Admit: 2012-01-03 | Discharge: 2012-01-03 | Disposition: A | Discharge status: Home / Self Care | Payer: Medicare Other | Source: Ambulatory Visit | Attending: Family Medicine | Admitting: Family Medicine

## 2012-01-03 DIAGNOSIS — R928 Other abnormal and inconclusive findings on diagnostic imaging of breast: Secondary | ICD-10-CM | POA: Insufficient documentation

## 2012-01-03 DIAGNOSIS — Z1231 Encounter for screening mammogram for malignant neoplasm of breast: Secondary | ICD-10-CM | POA: Insufficient documentation

## 2012-01-03 DIAGNOSIS — Z139 Encounter for screening, unspecified: Secondary | ICD-10-CM

## 2012-01-03 DIAGNOSIS — M810 Age-related osteoporosis without current pathological fracture: Secondary | ICD-10-CM | POA: Insufficient documentation

## 2012-01-06 ENCOUNTER — Other Ambulatory Visit: Payer: Self-pay | Admitting: Family Medicine

## 2012-01-06 DIAGNOSIS — R928 Other abnormal and inconclusive findings on diagnostic imaging of breast: Secondary | ICD-10-CM

## 2012-01-09 DIAGNOSIS — H40019 Open angle with borderline findings, low risk, unspecified eye: Secondary | ICD-10-CM | POA: Diagnosis not present

## 2012-01-18 ENCOUNTER — Ambulatory Visit (HOSPITAL_COMMUNITY)
Admission: RE | Admit: 2012-01-18 | Discharge: 2012-01-18 | Disposition: A | Discharge status: Home / Self Care | Payer: Medicare Other | Source: Ambulatory Visit | Attending: Family Medicine | Admitting: Family Medicine

## 2012-01-18 DIAGNOSIS — R928 Other abnormal and inconclusive findings on diagnostic imaging of breast: Secondary | ICD-10-CM | POA: Insufficient documentation

## 2012-01-26 DIAGNOSIS — S335XXA Sprain of ligaments of lumbar spine, initial encounter: Secondary | ICD-10-CM | POA: Diagnosis not present

## 2012-03-06 DIAGNOSIS — Z45018 Encounter for adjustment and management of other part of cardiac pacemaker: Secondary | ICD-10-CM | POA: Diagnosis not present

## 2012-03-06 DIAGNOSIS — I2510 Atherosclerotic heart disease of native coronary artery without angina pectoris: Secondary | ICD-10-CM | POA: Diagnosis not present

## 2012-03-06 DIAGNOSIS — I495 Sick sinus syndrome: Secondary | ICD-10-CM | POA: Diagnosis not present

## 2012-03-06 DIAGNOSIS — I10 Essential (primary) hypertension: Secondary | ICD-10-CM | POA: Diagnosis not present

## 2012-03-08 DIAGNOSIS — R5381 Other malaise: Secondary | ICD-10-CM | POA: Diagnosis not present

## 2012-03-08 DIAGNOSIS — Z79899 Other long term (current) drug therapy: Secondary | ICD-10-CM | POA: Diagnosis not present

## 2012-03-25 ENCOUNTER — Encounter (HOSPITAL_COMMUNITY): Payer: Self-pay | Admitting: *Deleted

## 2012-03-25 ENCOUNTER — Emergency Department (HOSPITAL_COMMUNITY): Payer: Medicare Other

## 2012-03-25 ENCOUNTER — Emergency Department (HOSPITAL_COMMUNITY)
Admission: EM | Admit: 2012-03-25 | Discharge: 2012-03-25 | Disposition: A | Discharge status: Home / Self Care | Payer: Medicare Other | Attending: Emergency Medicine | Admitting: Emergency Medicine

## 2012-03-25 DIAGNOSIS — I252 Old myocardial infarction: Secondary | ICD-10-CM | POA: Insufficient documentation

## 2012-03-25 DIAGNOSIS — Z79899 Other long term (current) drug therapy: Secondary | ICD-10-CM | POA: Insufficient documentation

## 2012-03-25 DIAGNOSIS — R070 Pain in throat: Secondary | ICD-10-CM | POA: Insufficient documentation

## 2012-03-25 DIAGNOSIS — I10 Essential (primary) hypertension: Secondary | ICD-10-CM | POA: Diagnosis not present

## 2012-03-25 DIAGNOSIS — J069 Acute upper respiratory infection, unspecified: Secondary | ICD-10-CM

## 2012-03-25 DIAGNOSIS — R059 Cough, unspecified: Secondary | ICD-10-CM | POA: Insufficient documentation

## 2012-03-25 DIAGNOSIS — Z7982 Long term (current) use of aspirin: Secondary | ICD-10-CM | POA: Diagnosis not present

## 2012-03-25 DIAGNOSIS — R05 Cough: Secondary | ICD-10-CM | POA: Diagnosis not present

## 2012-03-25 DIAGNOSIS — E7800 Pure hypercholesterolemia, unspecified: Secondary | ICD-10-CM | POA: Insufficient documentation

## 2012-03-25 DIAGNOSIS — R0609 Other forms of dyspnea: Secondary | ICD-10-CM | POA: Insufficient documentation

## 2012-03-25 DIAGNOSIS — R0602 Shortness of breath: Secondary | ICD-10-CM | POA: Diagnosis not present

## 2012-03-25 DIAGNOSIS — R0989 Other specified symptoms and signs involving the circulatory and respiratory systems: Secondary | ICD-10-CM | POA: Insufficient documentation

## 2012-03-25 DIAGNOSIS — J029 Acute pharyngitis, unspecified: Secondary | ICD-10-CM | POA: Diagnosis not present

## 2012-03-25 HISTORY — DX: Acute myocardial infarction, unspecified: I21.9

## 2012-03-25 HISTORY — DX: Anxiety disorder, unspecified: F41.9

## 2012-03-25 HISTORY — DX: Pneumonia, unspecified organism: J18.9

## 2012-03-25 LAB — CBC
MCH: 30.5 pg (ref 26.0–34.0)
MCV: 91.5 fL (ref 78.0–100.0)
Platelets: 159 10*3/uL (ref 150–400)
RDW: 13.5 % (ref 11.5–15.5)

## 2012-03-25 LAB — BASIC METABOLIC PANEL
Calcium: 9.4 mg/dL (ref 8.4–10.5)
Creatinine, Ser: 1.75 mg/dL — ABNORMAL HIGH (ref 0.50–1.10)
GFR calc Af Amer: 29 mL/min — ABNORMAL LOW (ref >90)

## 2012-03-25 MED ORDER — AZITHROMYCIN 250 MG PO TABS: 250.0000 mg | ORAL_TABLET | Freq: Every day | ORAL | Status: AC

## 2012-03-25 MED ORDER — SODIUM CHLORIDE 0.9 % IV BOLUS (SEPSIS)
1000.0000 mL | Freq: Once | INTRAVENOUS | Status: AC
  Administered 2012-03-25: 1000 mL via INTRAVENOUS

## 2012-03-25 NOTE — ED Notes (Signed)
[id number] Expected date:<BR> Expected time:<BR> Means of arrival:<BR> Comments:<BR> Closed per Charge

## 2012-03-25 NOTE — ED Notes (Signed)
Pt from home with reports of sore throat, chest congestion, cough, lower back pain and burning with urination. Pt also reports that husband resides at a nursing home and has recently been diagnosed with pneumonia.

## 2012-03-25 NOTE — ED Provider Notes (Signed)
History     CSN: [id number]  Arrival date & time 03/25/12  1057   First MD Initiated Contact with Patient 03/25/12 1123      Chief Complaint  Patient presents with  . Sore Throat  . Cough  . Chest congestion     (Consider location/radiation/quality/duration/timing/severity/associated sxs/prior treatment) HPI  Patient presents with complaints of sore throat. She states her husband is in a nursing home and has had pneumonia. The sore throat started last night. It is moderate. She is able to swallow. It is in the throat region and does not radiate. She has associated cough and some dyspnea. Patient has not noted fever or chills.  Past Medical History  Diagnosis Date  . Hypertension   . Hypercholesteremia   . Myocardial infarction   . Pneumonia   . Anxiety     Past Surgical History  Procedure Date  . Cardiac surgery   . Total hip arthroplasty   . Pacemaker insertion     Family History  Problem Relation Age of Onset  . Hypertension Mother   . Diabetes Mother   . Cancer Mother   . Hypertension Father   . Stroke Father     History  Substance Use Topics  . Smoking status: Never Smoker   . Smokeless tobacco: Never Used  . Alcohol Use: No    OB History    Grav Para Term Preterm Abortions TAB SAB Ect Mult Living                  Review of Systems  All other systems reviewed and are negative.    Allergies  Hydrocodone-acetaminophen  Home Medications   Current Outpatient Rx  Name Route Sig Dispense Refill  . ASPIRIN 81 MG PO TABS Oral Take 81 mg by mouth 2 (two) times daily.      Marland Kitchen EZETIMIBE 10 MG PO TABS Oral Take 10 mg by mouth daily.    Marland Kitchen FLUTICASONE-SALMETEROL 100-50 MCG/DOSE IN AEPB Inhalation Inhale 1 puff into the lungs every 12 (twelve) hours.      Marland Kitchen HYDROCHLOROTHIAZIDE 25 MG PO TABS Oral Take 25 mg by mouth daily.    Marland Kitchen MIRTAZAPINE 15 MG PO TABS Oral Take 7.5 mg by mouth at bedtime.      . NEBIVOLOL HCL 5 MG PO TABS Oral Take 5 mg by mouth daily.     Marland Kitchen NIACIN ER (ANTIHYPERLIPIDEMIC) 500 MG PO TBCR Oral Take 500 mg by mouth at bedtime.      . OMEPRAZOLE 20 MG PO CPDR Oral Take 20 mg by mouth daily.      Marland Kitchen PITAVASTATIN CALCIUM 1 MG PO TABS Oral Take 1 tablet by mouth daily.    Marland Kitchen VALSARTAN 160 MG PO TABS Oral Take 160 mg by mouth daily.        BP 103/49  Pulse 70  Temp(Src) 98.1 F (36.7 C) (Oral)  Resp 18  Wt 150 lb (68.04 kg)  SpO2 98%  Physical Exam  Nursing note and vitals reviewed. Constitutional: She is oriented to person, place, and time. She appears well-developed and well-nourished.  HENT:  Head: Normocephalic and atraumatic.  Eyes: Conjunctivae and EOM are normal. Pupils are equal, round, and reactive to light.  Neck: Normal range of motion. Neck supple.  Cardiovascular: Normal rate and regular rhythm.   Pulmonary/Chest: Effort normal and breath sounds normal.  Abdominal: Soft. Bowel sounds are normal.  Musculoskeletal: Normal range of motion.  Neurological: She is alert and oriented to person,  place, and time. She has normal reflexes.  Skin: Skin is warm and dry.  Psychiatric: She has a normal mood and affect.    ED Course  Procedures (including critical care time)  Labs Reviewed  BASIC METABOLIC PANEL - Abnormal; Notable for the following:    Glucose, Bld 100 (*)    BUN 39 (*)    Creatinine, Ser 1.75 (*)    GFR calc non Af Amer 25 (*)    GFR calc Af Amer 29 (*)    All other components within normal limits  RAPID STREP SCREEN  CBC   Dg Chest 2 View  03/25/2012  *RADIOLOGY REPORT*  Clinical Data: Cough and shortness of breath.  CHEST - 2 VIEW  Comparison: 10/14/2011  Findings: There is chronic mild cardiomegaly.  Dual lead pacer in place.  Pulmonary vascularity is normal.  Lungs are clear except for minimal pleural thickening in the right midzone laterally.  No effusions.  No acute osseous abnormality.  IMPRESSION: No acute disease.  Original Report Authenticated By: Larey Seat, M.D.     No  diagnosis found.  The patient was negative strep screen and no infiltrates seen on chest x-Silveria Botz. Her initial blood pressure was 103/49 but on recheck is 129/67. She is not tachycardic. Her creatinine is elevated at 1.75 which is up from prior. She received 1 L normal saline here. She is encouraged to take by mouth and have her creatinine rechecked with her primary care doctor in the next 2-3 days.  MDM          Shaune Pollack, MD 03/25/12 [phone]

## 2012-03-25 NOTE — Discharge Instructions (Signed)
Upper Respiratory Infection, Adult An upper respiratory infection (URI) is also known as the common cold. It is often caused by a type of germ (virus). Colds are easily spread (contagious). You can pass it to others by kissing, coughing, sneezing, or drinking out of the same glass. Usually, you get better in 1 or 2 weeks.  HOME CARE   Only take medicine as told by your doctor.   Use a warm mist humidifier or breathe in steam from a hot shower.   Drink enough water and fluids to keep your pee (urine) clear or pale yellow.   Get plenty of rest.   Return to work when your temperature is back to normal or as told by your doctor. You may use a face mask and wash your hands to stop your cold from spreading.  GET HELP RIGHT AWAY IF:   After the first few days, you feel you are getting worse.   You have questions about your medicine.   You have chills, shortness of breath, or brown or red spit (mucus).   You have yellow or brown snot (nasal discharge) or pain in the face, especially when you bend forward.   You have a fever, puffy (swollen) neck, pain when you swallow, or white spots in the back of your throat.   You have a bad headache, ear pain, sinus pain, or chest pain.   You have a high-pitched whistling sound when you breathe in and out (wheezing).   You have a lasting cough or cough up blood.   You have sore muscles or a stiff neck.  MAKE SURE YOU:   Understand these instructions.   Will watch your condition.   Will get help right away if you are not doing well or get worse.  Document Released: 04/04/2008 Document Revised: 10/06/2011 Document Reviewed: 02/21/2011 [facility] Patient Information 2012 [place].Antibiotic Nonuse  Your caregiver felt that the infection or problem was not one that would be helped with an antibiotic. Infections may be caused by viruses or bacteria. Only a caregiver can tell which one of these is the likely cause of an illness. A cold is the  most common cause of infection in both adults and children. A cold is a virus. Antibiotic treatment will have no effect on a viral infection. Viruses can lead to many lost days of work caring for sick children and many missed days of school. Children may catch as many as 10 "colds" or "flus" per year during which they can be tearful, cranky, and uncomfortable. The goal of treating a virus is aimed at keeping the ill person comfortable. Antibiotics are medications used to help the body fight bacterial infections. There are relatively few types of bacteria that cause infections but there are hundreds of viruses. While both viruses and bacteria cause infection they are very different types of germs. A viral infection will typically go away by itself within 7 to 10 days. Bacterial infections may spread or get worse without antibiotic treatment. Examples of bacterial infections are:  Sore throats (like strep throat or tonsillitis).   Infection in the lung (pneumonia).   Ear and skin infections.  Examples of viral infections are:  Colds or flus.   Most coughs and bronchitis.   Sore throats not caused by Strep.   Runny noses.  It is often best not to take an antibiotic when a viral infection is the cause of the problem. Antibiotics can kill off the helpful bacteria that we have inside our  body and allow harmful bacteria to start growing. Antibiotics can cause side effects such as allergies, nausea, and diarrhea without helping to improve the symptoms of the viral infection. Additionally, repeated uses of antibiotics can cause bacteria inside of our body to become resistant. That resistance can be passed onto harmful bacterial. The next time you have an infection it may be harder to treat if antibiotics are used when they are not needed. Not treating with antibiotics allows our own immune system to develop and take care of infections more efficiently. Also, antibiotics will work better for [place] when they are  prescribed for bacterial infections. Treatments for a child that is ill may include:  Give extra fluids throughout the day to stay hydrated.   Get plenty of rest.   Only give your child over-the-counter or prescription medicines for pain, discomfort, or fever as directed by your caregiver.   The use of a cool mist humidifier may help stuffy noses.   Cold medications if suggested by your caregiver.  Your caregiver may decide to start you on an antibiotic if:  The problem you were seen for today continues for a longer length of time than expected.   You develop a secondary bacterial infection.  SEEK MEDICAL CARE IF:  Fever lasts longer than 5 days.   Symptoms continue to get worse after 5 to 7 days or become severe.   Difficulty in breathing develops.   Signs of dehydration develop (poor drinking, rare urinating, dark colored urine).   Changes in behavior or worsening tiredness (listlessness or lethargy).  Document Released: 12/26/2001 Document Revised: 10/06/2011 Document Reviewed: 06/24/2009 [employer] Patient Information 2012 ExitCare, LLUpper Respiratory Infection, Adult An upper respiratory infection (URI) is also known as the common cold. It is often caused by a type of germ (virus). Colds are easily spread (contagious). You can pass it to others by kissing, coughing, sneezing, or drinking out of the same glass. Usually, you get better in 1 or 2 weeks.  HOME CARE   Only take medicine as told by your doctor.   Use a warm mist humidifier or breathe in steam from a hot shower.   Drink enough water and fluids to keep your pee (urine) clear or pale yellow.   Get plenty of rest.   Return to work when your temperature is back to normal or as told by your doctor. You may use a face mask and wash your hands to stop your cold from spreading.  GET HELP RIGHT AWAY IF:   After the first few days, you feel you are getting worse.   You have questions about your medicine.   You  have chills, shortness of breath, or brown or red spit (mucus).   You have yellow or brown snot (nasal discharge) or pain in the face, especially when you bend forward.   You have a fever, puffy (swollen) neck, pain when you swallow, or white spots in the back of your throat.   You have a bad headache, ear pain, sinus pain, or chest pain.   You have a high-pitched whistling sound when you breathe in and out (wheezing).   You have a lasting cough or cough up blood.   You have sore muscles or a stiff neck.  MAKE SURE YOU:   Understand these instructions.   Will watch your condition.   Will get help right away if you are not doing well or get worse.  Document Released: 04/04/2008 Document Revised: 10/06/2011 Document Reviewed: 02/21/2011 [employer]  Patient Information 2012 [email or web address].

## 2012-04-10 DIAGNOSIS — R0602 Shortness of breath: Secondary | ICD-10-CM | POA: Diagnosis not present

## 2012-04-10 DIAGNOSIS — R0989 Other specified symptoms and signs involving the circulatory and respiratory systems: Secondary | ICD-10-CM | POA: Diagnosis not present

## 2012-07-09 DIAGNOSIS — Z45018 Encounter for adjustment and management of other part of cardiac pacemaker: Secondary | ICD-10-CM | POA: Diagnosis not present

## 2012-07-09 DIAGNOSIS — E782 Mixed hyperlipidemia: Secondary | ICD-10-CM | POA: Diagnosis not present

## 2012-07-09 DIAGNOSIS — I495 Sick sinus syndrome: Secondary | ICD-10-CM | POA: Diagnosis not present

## 2012-07-09 DIAGNOSIS — R5381 Other malaise: Secondary | ICD-10-CM | POA: Diagnosis not present

## 2012-07-09 DIAGNOSIS — D649 Anemia, unspecified: Secondary | ICD-10-CM | POA: Diagnosis not present

## 2012-07-09 DIAGNOSIS — I2510 Atherosclerotic heart disease of native coronary artery without angina pectoris: Secondary | ICD-10-CM | POA: Diagnosis not present

## 2012-07-09 DIAGNOSIS — H40019 Open angle with borderline findings, low risk, unspecified eye: Secondary | ICD-10-CM | POA: Diagnosis not present

## 2012-07-09 DIAGNOSIS — Z79899 Other long term (current) drug therapy: Secondary | ICD-10-CM | POA: Diagnosis not present

## 2012-07-16 DIAGNOSIS — G5600 Carpal tunnel syndrome, unspecified upper limb: Secondary | ICD-10-CM | POA: Diagnosis not present

## 2012-07-16 DIAGNOSIS — S52599A Other fractures of lower end of unspecified radius, initial encounter for closed fracture: Secondary | ICD-10-CM | POA: Diagnosis not present

## 2012-07-30 DIAGNOSIS — R350 Frequency of micturition: Secondary | ICD-10-CM | POA: Diagnosis not present

## 2012-07-31 HISTORY — PX: WRIST FRACTURE SURGERY: SHX121

## 2012-08-02 DIAGNOSIS — IMO0002 Reserved for concepts with insufficient information to code with codable children: Secondary | ICD-10-CM | POA: Diagnosis not present

## 2012-08-02 DIAGNOSIS — E559 Vitamin D deficiency, unspecified: Secondary | ICD-10-CM | POA: Diagnosis not present

## 2012-08-02 DIAGNOSIS — R5381 Other malaise: Secondary | ICD-10-CM | POA: Diagnosis not present

## 2012-08-02 DIAGNOSIS — Z23 Encounter for immunization: Secondary | ICD-10-CM | POA: Diagnosis not present

## 2012-08-02 DIAGNOSIS — R319 Hematuria, unspecified: Secondary | ICD-10-CM | POA: Diagnosis not present

## 2012-08-02 DIAGNOSIS — E039 Hypothyroidism, unspecified: Secondary | ICD-10-CM | POA: Diagnosis not present

## 2012-08-07 ENCOUNTER — Encounter (HOSPITAL_COMMUNITY): Payer: Self-pay | Admitting: Emergency Medicine

## 2012-08-07 ENCOUNTER — Inpatient Hospital Stay (HOSPITAL_COMMUNITY)
Admission: EM | Admit: 2012-08-07 | Discharge: 2012-08-23 | DRG: 469 | Disposition: A | Discharge status: Other Acute Inpatient Hospital | Payer: Medicare Other | Attending: Internal Medicine | Admitting: Internal Medicine

## 2012-08-07 ENCOUNTER — Emergency Department (HOSPITAL_COMMUNITY): Payer: Medicare Other

## 2012-08-07 DIAGNOSIS — J9819 Other pulmonary collapse: Secondary | ICD-10-CM | POA: Diagnosis not present

## 2012-08-07 DIAGNOSIS — E46 Unspecified protein-calorie malnutrition: Secondary | ICD-10-CM | POA: Diagnosis present

## 2012-08-07 DIAGNOSIS — Z66 Do not resuscitate: Secondary | ICD-10-CM | POA: Diagnosis present

## 2012-08-07 DIAGNOSIS — Z86718 Personal history of other venous thrombosis and embolism: Secondary | ICD-10-CM | POA: Diagnosis not present

## 2012-08-07 DIAGNOSIS — Z7982 Long term (current) use of aspirin: Secondary | ICD-10-CM | POA: Diagnosis not present

## 2012-08-07 DIAGNOSIS — I517 Cardiomegaly: Secondary | ICD-10-CM | POA: Diagnosis not present

## 2012-08-07 DIAGNOSIS — I495 Sick sinus syndrome: Secondary | ICD-10-CM | POA: Diagnosis not present

## 2012-08-07 DIAGNOSIS — I2510 Atherosclerotic heart disease of native coronary artery without angina pectoris: Secondary | ICD-10-CM | POA: Diagnosis not present

## 2012-08-07 DIAGNOSIS — I4891 Unspecified atrial fibrillation: Secondary | ICD-10-CM | POA: Diagnosis present

## 2012-08-07 DIAGNOSIS — S72009A Fracture of unspecified part of neck of unspecified femur, initial encounter for closed fracture: Secondary | ICD-10-CM | POA: Diagnosis not present

## 2012-08-07 DIAGNOSIS — W108XXA Fall (on) (from) other stairs and steps, initial encounter: Secondary | ICD-10-CM | POA: Diagnosis present

## 2012-08-07 DIAGNOSIS — M79609 Pain in unspecified limb: Secondary | ICD-10-CM | POA: Diagnosis not present

## 2012-08-07 DIAGNOSIS — S52509A Unspecified fracture of the lower end of unspecified radius, initial encounter for closed fracture: Secondary | ICD-10-CM

## 2012-08-07 DIAGNOSIS — Z01810 Encounter for preprocedural cardiovascular examination: Secondary | ICD-10-CM | POA: Diagnosis not present

## 2012-08-07 DIAGNOSIS — Z951 Presence of aortocoronary bypass graft: Secondary | ICD-10-CM | POA: Diagnosis not present

## 2012-08-07 DIAGNOSIS — Z01811 Encounter for preprocedural respiratory examination: Secondary | ICD-10-CM | POA: Diagnosis not present

## 2012-08-07 DIAGNOSIS — S52609A Unspecified fracture of lower end of unspecified ulna, initial encounter for closed fracture: Secondary | ICD-10-CM | POA: Diagnosis not present

## 2012-08-07 DIAGNOSIS — W19XXXA Unspecified fall, initial encounter: Secondary | ICD-10-CM

## 2012-08-07 DIAGNOSIS — J811 Chronic pulmonary edema: Secondary | ICD-10-CM | POA: Diagnosis not present

## 2012-08-07 DIAGNOSIS — Z950 Presence of cardiac pacemaker: Secondary | ICD-10-CM | POA: Diagnosis present

## 2012-08-07 DIAGNOSIS — S79929A Unspecified injury of unspecified thigh, initial encounter: Secondary | ICD-10-CM | POA: Diagnosis not present

## 2012-08-07 DIAGNOSIS — K2289 Other specified disease of esophagus: Secondary | ICD-10-CM | POA: Diagnosis not present

## 2012-08-07 DIAGNOSIS — J9851 Mediastinitis: Secondary | ICD-10-CM | POA: Diagnosis not present

## 2012-08-07 DIAGNOSIS — R0609 Other forms of dyspnea: Secondary | ICD-10-CM

## 2012-08-07 DIAGNOSIS — S0993XA Unspecified injury of face, initial encounter: Secondary | ICD-10-CM | POA: Diagnosis not present

## 2012-08-07 DIAGNOSIS — R1310 Dysphagia, unspecified: Secondary | ICD-10-CM | POA: Diagnosis present

## 2012-08-07 DIAGNOSIS — K223 Perforation of esophagus: Secondary | ICD-10-CM | POA: Diagnosis not present

## 2012-08-07 DIAGNOSIS — E785 Hyperlipidemia, unspecified: Secondary | ICD-10-CM

## 2012-08-07 DIAGNOSIS — I252 Old myocardial infarction: Secondary | ICD-10-CM

## 2012-08-07 DIAGNOSIS — S5290XA Unspecified fracture of unspecified forearm, initial encounter for closed fracture: Secondary | ICD-10-CM | POA: Diagnosis not present

## 2012-08-07 DIAGNOSIS — Z87898 Personal history of other specified conditions: Secondary | ICD-10-CM | POA: Diagnosis present

## 2012-08-07 DIAGNOSIS — I10 Essential (primary) hypertension: Secondary | ICD-10-CM | POA: Diagnosis not present

## 2012-08-07 DIAGNOSIS — S52123A Displaced fracture of head of unspecified radius, initial encounter for closed fracture: Principal | ICD-10-CM

## 2012-08-07 DIAGNOSIS — I359 Nonrheumatic aortic valve disorder, unspecified: Secondary | ICD-10-CM | POA: Diagnosis present

## 2012-08-07 DIAGNOSIS — I129 Hypertensive chronic kidney disease with stage 1 through stage 4 chronic kidney disease, or unspecified chronic kidney disease: Secondary | ICD-10-CM | POA: Diagnosis present

## 2012-08-07 DIAGNOSIS — J9600 Acute respiratory failure, unspecified whether with hypoxia or hypercapnia: Secondary | ICD-10-CM | POA: Diagnosis not present

## 2012-08-07 DIAGNOSIS — S72002A Fracture of unspecified part of neck of left femur, initial encounter for closed fracture: Secondary | ICD-10-CM | POA: Diagnosis present

## 2012-08-07 DIAGNOSIS — Z87440 Personal history of urinary (tract) infections: Secondary | ICD-10-CM | POA: Diagnosis present

## 2012-08-07 DIAGNOSIS — R0989 Other specified symptoms and signs involving the circulatory and respiratory systems: Secondary | ICD-10-CM | POA: Diagnosis present

## 2012-08-07 DIAGNOSIS — S298XXA Other specified injuries of thorax, initial encounter: Secondary | ICD-10-CM | POA: Diagnosis not present

## 2012-08-07 DIAGNOSIS — E782 Mixed hyperlipidemia: Secondary | ICD-10-CM | POA: Diagnosis not present

## 2012-08-07 DIAGNOSIS — M25519 Pain in unspecified shoulder: Secondary | ICD-10-CM | POA: Diagnosis not present

## 2012-08-07 DIAGNOSIS — T8183XA Persistent postprocedural fistula, initial encounter: Secondary | ICD-10-CM | POA: Diagnosis not present

## 2012-08-07 DIAGNOSIS — D62 Acute posthemorrhagic anemia: Secondary | ICD-10-CM | POA: Diagnosis not present

## 2012-08-07 DIAGNOSIS — S52599A Other fractures of lower end of unspecified radius, initial encounter for closed fracture: Secondary | ICD-10-CM

## 2012-08-07 DIAGNOSIS — G5600 Carpal tunnel syndrome, unspecified upper limb: Secondary | ICD-10-CM | POA: Diagnosis not present

## 2012-08-07 DIAGNOSIS — S79919A Unspecified injury of unspecified hip, initial encounter: Secondary | ICD-10-CM | POA: Diagnosis not present

## 2012-08-07 DIAGNOSIS — S72019A Unspecified intracapsular fracture of unspecified femur, initial encounter for closed fracture: Secondary | ICD-10-CM | POA: Diagnosis not present

## 2012-08-07 DIAGNOSIS — N1832 Chronic kidney disease, stage 3b: Secondary | ICD-10-CM | POA: Diagnosis present

## 2012-08-07 DIAGNOSIS — E860 Dehydration: Secondary | ICD-10-CM | POA: Diagnosis present

## 2012-08-07 DIAGNOSIS — S199XXA Unspecified injury of neck, initial encounter: Secondary | ICD-10-CM | POA: Diagnosis not present

## 2012-08-07 DIAGNOSIS — R079 Chest pain, unspecified: Secondary | ICD-10-CM | POA: Diagnosis not present

## 2012-08-07 DIAGNOSIS — N183 Chronic kidney disease, stage 3 unspecified: Secondary | ICD-10-CM | POA: Diagnosis present

## 2012-08-07 DIAGNOSIS — K219 Gastro-esophageal reflux disease without esophagitis: Secondary | ICD-10-CM | POA: Diagnosis not present

## 2012-08-07 DIAGNOSIS — IMO0002 Reserved for concepts with insufficient information to code with codable children: Secondary | ICD-10-CM | POA: Diagnosis not present

## 2012-08-07 DIAGNOSIS — J90 Pleural effusion, not elsewhere classified: Secondary | ICD-10-CM | POA: Diagnosis not present

## 2012-08-07 DIAGNOSIS — Z96649 Presence of unspecified artificial hip joint: Secondary | ICD-10-CM

## 2012-08-07 DIAGNOSIS — N289 Disorder of kidney and ureter, unspecified: Secondary | ICD-10-CM | POA: Diagnosis not present

## 2012-08-07 DIAGNOSIS — I82409 Acute embolism and thrombosis of unspecified deep veins of unspecified lower extremity: Secondary | ICD-10-CM | POA: Diagnosis present

## 2012-08-07 DIAGNOSIS — J9601 Acute respiratory failure with hypoxia: Secondary | ICD-10-CM | POA: Diagnosis present

## 2012-08-07 DIAGNOSIS — I480 Paroxysmal atrial fibrillation: Secondary | ICD-10-CM | POA: Diagnosis present

## 2012-08-07 DIAGNOSIS — M47812 Spondylosis without myelopathy or radiculopathy, cervical region: Secondary | ICD-10-CM | POA: Diagnosis not present

## 2012-08-07 DIAGNOSIS — Z01818 Encounter for other preprocedural examination: Secondary | ICD-10-CM

## 2012-08-07 DIAGNOSIS — I358 Other nonrheumatic aortic valve disorders: Secondary | ICD-10-CM | POA: Diagnosis present

## 2012-08-07 DIAGNOSIS — R0682 Tachypnea, not elsewhere classified: Secondary | ICD-10-CM | POA: Diagnosis not present

## 2012-08-07 DIAGNOSIS — S0990XA Unspecified injury of head, initial encounter: Secondary | ICD-10-CM | POA: Diagnosis not present

## 2012-08-07 DIAGNOSIS — I509 Heart failure, unspecified: Secondary | ICD-10-CM | POA: Diagnosis present

## 2012-08-07 DIAGNOSIS — D500 Iron deficiency anemia secondary to blood loss (chronic): Secondary | ICD-10-CM | POA: Diagnosis not present

## 2012-08-07 DIAGNOSIS — T148XXA Other injury of unspecified body region, initial encounter: Secondary | ICD-10-CM | POA: Diagnosis not present

## 2012-08-07 DIAGNOSIS — S52502A Unspecified fracture of the lower end of left radius, initial encounter for closed fracture: Secondary | ICD-10-CM | POA: Diagnosis present

## 2012-08-07 DIAGNOSIS — E7800 Pure hypercholesterolemia, unspecified: Secondary | ICD-10-CM | POA: Diagnosis present

## 2012-08-07 DIAGNOSIS — R918 Other nonspecific abnormal finding of lung field: Secondary | ICD-10-CM | POA: Diagnosis not present

## 2012-08-07 DIAGNOSIS — Z09 Encounter for follow-up examination after completed treatment for conditions other than malignant neoplasm: Secondary | ICD-10-CM | POA: Diagnosis not present

## 2012-08-07 DIAGNOSIS — M19079 Primary osteoarthritis, unspecified ankle and foot: Secondary | ICD-10-CM | POA: Diagnosis not present

## 2012-08-07 DIAGNOSIS — R0602 Shortness of breath: Secondary | ICD-10-CM | POA: Diagnosis not present

## 2012-08-07 DIAGNOSIS — K228 Other specified diseases of esophagus: Secondary | ICD-10-CM | POA: Diagnosis not present

## 2012-08-07 HISTORY — DX: Presence of unspecified artificial hip joint: Z96.649

## 2012-08-07 HISTORY — DX: Unspecified osteoarthritis, unspecified site: M19.90

## 2012-08-07 HISTORY — DX: Atherosclerotic heart disease of native coronary artery without angina pectoris: I25.10

## 2012-08-07 HISTORY — DX: Presence of cardiac pacemaker: Z95.0

## 2012-08-07 HISTORY — DX: Unspecified asthma, uncomplicated: J45.909

## 2012-08-07 HISTORY — DX: Gastro-esophageal reflux disease without esophagitis: K21.9

## 2012-08-07 LAB — COMPREHENSIVE METABOLIC PANEL
ALT: 21 U/L (ref 0–35)
AST: 33 U/L (ref 0–37)
Albumin: 3.7 g/dL (ref 3.5–5.2)
Calcium: 10.2 mg/dL (ref 8.4–10.5)
Creatinine, Ser: 1.45 mg/dL — ABNORMAL HIGH (ref 0.50–1.10)
Sodium: 136 mEq/L (ref 135–145)

## 2012-08-07 LAB — URINALYSIS, MICROSCOPIC ONLY
Leukocytes, UA: NEGATIVE
Nitrite: NEGATIVE
Specific Gravity, Urine: 1.02 (ref 1.005–1.030)
Urobilinogen, UA: 0.2 mg/dL (ref 0.0–1.0)
pH: 7 (ref 5.0–8.0)

## 2012-08-07 LAB — CBC WITH DIFFERENTIAL/PLATELET
Basophils Absolute: 0.1 10*3/uL (ref 0.0–0.1)
Basophils Relative: 1 % (ref 0–1)
Eosinophils Relative: 1 % (ref 0–5)
Lymphocytes Relative: 21 % (ref 12–46)
MCHC: 34.1 g/dL (ref 30.0–36.0)
MCV: 88.7 fL (ref 78.0–100.0)
Platelets: 182 10*3/uL (ref 150–400)
RDW: 13 % (ref 11.5–15.5)
WBC: 5.9 10*3/uL (ref 4.0–10.5)

## 2012-08-07 LAB — PROTIME-INR
INR: 0.94 (ref 0.00–1.49)
Prothrombin Time: 12.5 seconds (ref 11.6–15.2)

## 2012-08-07 MED ORDER — ONDANSETRON HCL 4 MG/2ML IJ SOLN: 4.0000 mg | Freq: Three times a day (TID) | INTRAMUSCULAR | Status: AC | PRN

## 2012-08-07 MED ORDER — ONDANSETRON HCL 4 MG/2ML IJ SOLN
4.0000 mg | Freq: Once | INTRAMUSCULAR | Status: AC
  Administered 2012-08-07: 4 mg via INTRAVENOUS
  Filled 2012-08-07: qty 2

## 2012-08-07 MED ORDER — SENNOSIDES-DOCUSATE SODIUM 8.6-50 MG PO TABS: 1.0000 | ORAL_TABLET | Freq: Every evening | ORAL | Status: DC | PRN

## 2012-08-07 MED ORDER — ZOLPIDEM TARTRATE 5 MG PO TABS: 5.0000 mg | ORAL_TABLET | Freq: Every evening | ORAL | Status: DC | PRN

## 2012-08-07 MED ORDER — SODIUM CHLORIDE 0.9 % IJ SOLN
3.0000 mL | Freq: Two times a day (BID) | INTRAMUSCULAR | Status: DC
  Administered 2012-08-08 – 2012-08-16 (×3): 3 mL via INTRAVENOUS

## 2012-08-07 MED ORDER — ENOXAPARIN SODIUM 30 MG/0.3ML ~~LOC~~ SOLN
30.0000 mg | Freq: Every day | SUBCUTANEOUS | Status: DC
  Filled 2012-08-07 (×2): qty 0.3

## 2012-08-07 MED ORDER — PANTOPRAZOLE SODIUM 40 MG PO TBEC
40.0000 mg | DELAYED_RELEASE_TABLET | Freq: Every day | ORAL | Status: DC
  Administered 2012-08-09 – 2012-08-10 (×2): 40 mg via ORAL
  Filled 2012-08-07 (×2): qty 1

## 2012-08-07 MED ORDER — ONDANSETRON HCL 4 MG PO TABS: 4.0000 mg | ORAL_TABLET | Freq: Four times a day (QID) | ORAL | Status: DC | PRN

## 2012-08-07 MED ORDER — POTASSIUM CHLORIDE IN NACL 20-0.45 MEQ/L-% IV SOLN
INTRAVENOUS | Status: DC
  Administered 2012-08-07: 20:00:00 via INTRAVENOUS
  Filled 2012-08-07 (×5): qty 1000

## 2012-08-07 MED ORDER — SODIUM CHLORIDE 0.9 % IV SOLN
INTRAVENOUS | Status: DC
  Administered 2012-08-08 (×2): via INTRAVENOUS
  Administered 2012-08-09: 100 mL/h via INTRAVENOUS
  Administered 2012-08-10: 75 mL/h via INTRAVENOUS

## 2012-08-07 MED ORDER — METOPROLOL SUCCINATE ER 50 MG PO TB24
50.0000 mg | ORAL_TABLET | Freq: Every day | ORAL | Status: DC
  Administered 2012-08-08: 50 mg via ORAL
  Filled 2012-08-07 (×3): qty 1

## 2012-08-07 MED ORDER — HYDROMORPHONE HCL PF 1 MG/ML IJ SOLN
0.1000 mg | INTRAMUSCULAR | Status: DC | PRN
Start: 2012-08-07 — End: 2012-08-07

## 2012-08-07 MED ORDER — ACETAMINOPHEN 500 MG PO TABS
1000.0000 mg | ORAL_TABLET | Freq: Four times a day (QID) | ORAL | Status: DC | PRN
  Filled 2012-08-07: qty 2

## 2012-08-07 MED ORDER — HYDROMORPHONE HCL PF 1 MG/ML IJ SOLN
1.0000 mg | Freq: Once | INTRAMUSCULAR | Status: AC
  Administered 2012-08-07: 1 mg via INTRAVENOUS
  Filled 2012-08-07: qty 1

## 2012-08-07 MED ORDER — MORPHINE SULFATE 4 MG/ML IJ SOLN
4.0000 mg | Freq: Once | INTRAMUSCULAR | Status: AC
  Administered 2012-08-07: 4 mg via INTRAVENOUS
  Filled 2012-08-07: qty 1

## 2012-08-07 MED ORDER — ALUM & MAG HYDROXIDE-SIMETH 200-200-20 MG/5ML PO SUSP: 30.0000 mL | Freq: Four times a day (QID) | ORAL | Status: DC | PRN

## 2012-08-07 MED ORDER — POTASSIUM CHLORIDE IN NACL 20-0.45 MEQ/L-% IV SOLN
INTRAVENOUS | Status: AC
  Filled 2012-08-07: qty 1000

## 2012-08-07 MED ORDER — OXYCODONE HCL 5 MG PO TABS
5.0000 mg | ORAL_TABLET | ORAL | Status: DC | PRN
  Administered 2012-08-08 – 2012-08-10 (×2): 5 mg via ORAL
  Filled 2012-08-07 (×2): qty 1

## 2012-08-07 MED ORDER — MORPHINE SULFATE 2 MG/ML IJ SOLN
2.0000 mg | INTRAMUSCULAR | Status: DC | PRN
  Administered 2012-08-09 – 2012-08-10 (×3): 2 mg via INTRAVENOUS
  Filled 2012-08-07 (×3): qty 1

## 2012-08-07 NOTE — ED Provider Notes (Signed)
History  This chart was scribed for [name], MD by Lovena Le Day. This patient was seen in room APA08/APA08 and the patient's care was started at 1023.   CSN: [id number]  Arrival date & time 08/07/12  1023   First MD Initiated Contact with Patient 08/07/12 1029      Chief Complaint  Patient presents with  . Fall   Patient is a 78 y.o. female presenting with fall. The history is provided by the patient. No language interpreter was used.  Fall The accident occurred 1 to 2 hours ago. The fall occurred while walking. She landed on a hard floor. There was no blood loss. The point of impact was the left hip and left wrist. The pain is present in the left wrist and left hip. The pain is moderate. She was not ambulatory at the scene. There was no entrapment after the fall. Pertinent negatives include no fever, no abdominal pain, no nausea, no vomiting, no headaches and no loss of consciousness. The symptoms are aggravated by use of the injured limb. Treatment on scene includes a c-collar and a backboard. She has tried nothing for the symptoms.   Amy [name] is a 78 y.o. female brought in by ambulance, who presents to the Emergency Department complaining of left hip/wrist pain after  falling this  AM at home while walking to the car to go to her ophthalmolgy appointment. She does not know why she fell. She used to have falls but they got better about 3 years ago.  States her sister in law found her on the ground and called EMS for her. She does not know if she ha LOC and does not think she  hit her head. She presents with C-collar and on backboard, given no medicines PTA. She states unable to stand or bear weight on left hip. She states mild nausea. She denies HA, neck pain, CP, abdominal pain. She has a pacemaker and had an MI 4 years ago.   Her PCP is Dr. Hilma Favors Dr. Noemi Chapel peformed her right total hip surgery.   Past Medical History  Diagnosis Date  . Hypertension   . Hypercholesteremia   .  Myocardial infarction   . Pneumonia   . Anxiety     Past Surgical History  Procedure Date  . Cardiac surgery   . Total hip arthroplasty   . Pacemaker insertion     Family History  Problem Relation Age of Onset  . Hypertension Mother   . Diabetes Mother   . Cancer Mother   . Hypertension Father   . Stroke Father     History  Substance Use Topics  . Smoking status: Never Smoker   . Smokeless tobacco: Never Used  . Alcohol Use: No  Lives alone Lives at home  OB History    Grav Para Term Preterm Abortions TAB SAB Ect Mult Living                  Review of Systems  Constitutional: Negative for fever and chills.  Respiratory: Negative for shortness of breath.   Cardiovascular: Negative for chest pain.  Gastrointestinal: Negative for nausea, vomiting and abdominal pain.  Musculoskeletal:       Left hip/left wrist pain.   Neurological: Negative for loss of consciousness, syncope, weakness and headaches.  All other systems reviewed and are negative.    Allergies  Hydrocodone-acetaminophen  Home Medications   Current Outpatient Rx  Name Route Sig Dispense Refill  . ACETAMINOPHEN  500 MG PO TABS Oral Take 1,000 mg by mouth every 6 (six) hours as needed. Pain    . ASPIRIN 81 MG PO TABS Oral Take 81 mg by mouth 2 (two) times daily.      Marland Kitchen CIPROFLOXACIN HCL 500 MG PO TABS Oral Take 500 mg by mouth 2 (two) times daily.    . OMEGA-3 FATTY ACIDS 1000 MG PO CAPS Oral Take 4 g by mouth daily.    Marland Kitchen HYDROCHLOROTHIAZIDE 25 MG PO TABS Oral Take 25 mg by mouth daily.    Marland Kitchen METOPROLOL SUCCINATE ER 25 MG PO TB24 Oral Take 25 mg by mouth daily.    Marland Kitchen NIACIN ER (ANTIHYPERLIPIDEMIC) 500 MG PO TBCR Oral Take 500 mg by mouth at bedtime.      . OMEPRAZOLE 20 MG PO CPDR Oral Take 20 mg by mouth daily.      Marland Kitchen VALSARTAN 160 MG PO TABS Oral Take 160 mg by mouth daily.        Triage Vitals: BP 159/64  Pulse 69  Temp 97.9 F (36.6 C)  Resp 20  Wt 150 lb (68.04 kg)  SpO2 97%  Vital  signs normal    Physical Exam  Nursing note and vitals reviewed. Constitutional: She is oriented to person, place, and time. She appears well-developed and well-nourished.  Non-toxic appearance. She does not appear ill. No distress.       Pt removed from backboard during my exam and C collar left in place.    HENT:  Head: Normocephalic and atraumatic.  Right Ear: External ear normal.  Left Ear: External ear normal.  Nose: Nose normal. No mucosal edema or rhinorrhea.  Mouth/Throat: Oropharynx is clear and moist and mucous membranes are normal. No dental abscesses or uvula swelling.  Eyes: Conjunctivae normal and EOM are normal. Pupils are equal, round, and reactive to light.  Neck: Normal range of motion and full passive range of motion without pain. Neck supple.  Cardiovascular: Normal rate, regular rhythm and normal heart sounds.  Exam reveals no gallop and no friction rub.   No murmur heard. Pulmonary/Chest: Effort normal and breath sounds normal. No respiratory distress. She has no wheezes. She has no rhonchi. She has no rales. She exhibits no tenderness and no crepitus.  Abdominal: Soft. Normal appearance and bowel sounds are normal. She exhibits no distension. There is no tenderness. There is no rebound and no guarding.  Musculoskeletal: Normal range of motion. She exhibits tenderness. She exhibits no edema.       Pelvis stable and non tender with stress. Left leg is mildly shortened and externally rotated. Tender in true left hip joint. Left wrist deformity, good pulse, neurovascularly intact, nml sensation.   Neurological: She is alert and oriented to person, place, and time. She has normal strength. No cranial nerve deficit.  Skin: Skin is warm, dry and intact. No rash noted. No erythema. No pallor.       Abrasion 4th superficial abrasion of DIP joint left ring finger.   Psychiatric: She has a normal mood and affect. Her speech is normal and behavior is normal. Her mood appears not  anxious.    ED Course  Procedures (including critical care time)  Medications  morphine 4 MG/ML injection 4 mg (4 mg Intravenous Given 08/07/12 1102)  ondansetron (ZOFRAN) injection 4 mg (4 mg Intravenous Given 08/07/12 1102)  morphine 4 MG/ML injection 4 mg (4 mg Intravenous Given 08/07/12 1216)  ondansetron (ZOFRAN) injection 4 mg (4 mg Intravenous Given  08/07/12 1346)  HYDROmorphone (DILAUDID) injection 1 mg (1 mg Intravenous Given 08/07/12 1509)  ondansetron (ZOFRAN) injection 4 mg (4 mg Intravenous Given 08/07/12 1509)    DIAGNOSTIC STUDIES: Oxygen Saturation is 97% on room air, normal by my interpretation.    COORDINATION OF CARE: At 1045 AM Discussed treatment plan with patient which includes pain/nausea medicine, left hip, left wrist X-ray, CXR, head/c-spine CT, blood work, UA . Patient agrees.   PT placed in sugar tong splint. She requests to have Dr Noemi Chapel repair her hip.   14:29 Dr Henrene Pastor states to have the hospitalist at [facility] do her admission H & P  15:46 Dr Ronnie Derby states he will do surgery tonight if hospitalist can get her medically clear, also will need hand consult for her wrist.   16:26 Dr Eliseo Squires accepts in transfer [facility], tele, Team 10 Dr [place] Pink  Results for orders placed during the hospital encounter of 08/07/12  CBC WITH DIFFERENTIAL      Component Value Range   WBC 5.9  4.0 - 10.5 K/uL   RBC 4.53  3.87 - 5.11 MIL/uL   Hemoglobin 13.7  12.0 - 15.0 g/dL   HCT 40.2  36.0 - 46.0 %   MCV 88.7  78.0 - 100.0 fL   MCH 30.2  26.0 - 34.0 pg   MCHC 34.1  30.0 - 36.0 g/dL   RDW 13.0  11.5 - 15.5 %   Platelets 182  150 - 400 K/uL   Neutrophils Relative 72  43 - 77 %   Neutro Abs 4.2  1.7 - 7.7 K/uL   Lymphocytes Relative 21  12 - 46 %   Lymphs Abs 1.2  0.7 - 4.0 K/uL   Monocytes Relative 5  3 - 12 %   Monocytes Absolute 0.3  0.1 - 1.0 K/uL   Eosinophils Relative 1  0 - 5 %   Eosinophils Absolute 0.1  0.0 - 0.7 K/uL   Basophils Relative 1  0 - 1 %   Basophils  Absolute 0.1  0.0 - 0.1 K/uL  COMPREHENSIVE METABOLIC PANEL      Component Value Range   Sodium 136  135 - 145 mEq/L   Potassium 3.8  3.5 - 5.1 mEq/L   Chloride 98  96 - 112 mEq/L   CO2 29  19 - 32 mEq/L   Glucose, Bld 102 (*) 70 - 99 mg/dL   BUN 24 (*) 6 - 23 mg/dL   Creatinine, Ser 1.45 (*) 0.50 - 1.10 mg/dL   Calcium 10.2  8.4 - 10.5 mg/dL   Total Protein 6.9  6.0 - 8.3 g/dL   Albumin 3.7  3.5 - 5.2 g/dL   AST 33  0 - 37 U/L   ALT 21  0 - 35 U/L   Alkaline Phosphatase 87  39 - 117 U/L   Total Bilirubin 0.3  0.3 - 1.2 mg/dL   GFR calc non Af Amer 31 (*) >90 mL/min   GFR calc Af Amer 36 (*) >90 mL/min  APTT      Component Value Range   aPTT 26  24 - 37 seconds  PROTIME-INR      Component Value Range   Prothrombin Time 12.5  11.6 - 15.2 seconds   INR 0.94  0.00 - 1.49  URINALYSIS, MICROSCOPIC ONLY      Component Value Range   Color, Urine YELLOW  YELLOW   APPearance CLEAR  CLEAR   Specific Gravity, Urine 1.020  1.005 - 1.030  pH 7.0  5.0 - 8.0   Glucose, UA NEGATIVE  NEGATIVE mg/dL   Hgb urine dipstick NEGATIVE  NEGATIVE   Bilirubin Urine NEGATIVE  NEGATIVE   Ketones, ur NEGATIVE  NEGATIVE mg/dL   Protein, ur NEGATIVE  NEGATIVE mg/dL   Urobilinogen, UA 0.2  0.0 - 1.0 mg/dL   Nitrite NEGATIVE  NEGATIVE   Leukocytes, UA NEGATIVE  NEGATIVE  SAMPLE TO BLOOD BANK      Component Value Range   Blood Bank Specimen SAMPLE AVAILABLE FOR TESTING     Sample Expiration 08/10/2012      Laboratory interpretation all normal except renal insuffic  Dg Chest 1 View  08/07/2012  *RADIOLOGY REPORT*  Clinical Data: Fall  CHEST - 1 VIEW  Comparison: 03/25/2012  Findings: Left subclavian sequential transvenous pacemaker leads project at right atrium and right ventricle. Upper normal heart size post CABG. Calcified tortuous aorta. Pulmonary vascularity normal. Lungs grossly clear. No pleural effusion or pneumothorax. Minimal biapical scarring. Diffuse osseous demineralization.  IMPRESSION:  Post CABG and pacemaker. No acute abnormalities.   Original Report Authenticated By: Burnetta Sabin, M.D.    Dg Wrist Complete Left  08/07/2012  *RADIOLOGY REPORT*  Clinical Data: Left wrist pain and swelling post fall  LEFT WRIST - COMPLETE 3+ VIEW  Comparison: 03/07/2005  Findings: Minimally displaced ulnar styloid fracture. Comminuted distal left radial metaphyseal fracture with intra- articular extension at the radiocarpal joint. Question extension into the distal radioulnar joint as well. Dorsal tilt of the distal radial articular surface. Overlying soft tissue swelling. Minimal degenerative changes at first [facility] joint. No additional acute fracture or dislocation identified. Osseous demineralization.  IMPRESSION: Ulnar styloid fracture. Angulated mildly displaced intra-articular distal left radial metaphyseal fracture. Osseous demineralization.   Original Report Authenticated By: Burnetta Sabin, M.D.    Dg Hip Complete Left  08/07/2012  *RADIOLOGY REPORT*  Clinical Data: Left hip pain post fall  LEFT HIP - COMPLETE 2+ VIEW  Comparison: None  Findings: Minimally impacted left femoral neck fracture. Osseous mineralization appears diffusely decreased. Left hip joint space preserved. No additional fracture or dislocation identified. Symmetric SI joints. Pelvis appears intact.  IMPRESSION: Minimally impacted left femoral neck fracture.   Original Report Authenticated By: Burnetta Sabin, M.D.    Ct Head Wo Contrast  Ct Cervical Spine Wo Contrast  08/07/2012  *RADIOLOGY REPORT*  Clinical Data:  Fall.  CT HEAD WITHOUT CONTRAST CT CERVICAL SPINE WITHOUT CONTRAST  Technique:  Multidetector CT imaging of the head and cervical spine was performed following the standard protocol without intravenous contrast.  Multiplanar CT image reconstructions of the cervical spine were also generated.  Comparison:   None  CT HEAD  Findings: No skull fracture or intracranial hemorrhage.  No CT evidence of large acute infarct.  Mild  small vessel disease type changes.  No hydrocephalus.  No intracranial mass lesion detected on this unenhanced exam.  Vascular calcifications.  IMPRESSION: No skull fracture or intracranial hemorrhage.  CT CERVICAL SPINE  Findings: No cervical spine fracture.  Dextroscoliosis and degenerative changes.  Spinal stenosis most notable C4-5.  Biapical lung scarring.  Carotid calcifications.  IMPRESSION: No cervical spine fracture.  Cervical spondylotic changes with spinal stenosis most notable C4- 5.   Original Report Authenticated By: Doug Sou, M.D.     Date: 08/07/2012  Rate: 70  Rhythm: atrial paced rhythm  QRS Axis: normal  Intervals: PR prolonged  ST/T Wave abnormalities: normal  Conduction Disutrbances:none  Narrative Interpretation:   Old EKG  Reviewed: none available    1. Femoral neck fracture   2. Hip fracture, left   3. Distal radial fracture   4. Renal insufficiency   5. Fall    Plan transfer to [facility] for admission  Rolland Porter, MD, FACEP    MDM  I personally performed the services described in this documentation, which was scribed in my presence. The recorded information has been reviewed and considered.  Rolland Porter, MD, Abram Sander          [name], MD 08/07/12 [phone]

## 2012-08-07 NOTE — ED Notes (Signed)
[facility] at bedside for transfer to [place] 

## 2012-08-07 NOTE — H&P (Signed)
PCP:   Purvis Kilts, MD  Orthopedics: Dr. Patrick Jupiter  Chief Complaint:  Fall  HPI: This is an unfortunate 78 year old female who lives alone, who had a mechanical fall while going up the steps at her front of her home. She states she did not experience any loss of consciousness. She denies any chest pain, palpitations. At that time she denied lightheadedness however, her daughter states her blood pressures been running in the low 100s recently and the patient is also been having some dizziness. The patient is also been having some inner ear issues and has been on Antivert as needed.  The patient's last major surgery with a CABG 4 years ago along with a postoperative pacemaker. She states she does not get chest pains, no history of CHF. She does have some chronic shortness of breath for years which remains unchanged.   Enroute in the ambulance she did have a episode off A. fib with RVR heart rate to 120. She spontaneously reverted to sinus rhythm. Patient transferred from [place]:  The patient denies anorexia, fever, weight loss,, vision loss, decreased hearing, hoarseness, chest pain, syncope, peripheral edema, balance deficits, hemoptysis, abdominal pain, melena, hematochezia, severe indigestion/heartburn, hematuria, incontinence, genital sores, muscle weakness, suspicious skin lesions, transient blindness, difficulty walking, depression, unusual weight change, abnormal bleeding, enlarged lymph nodes, angioedema, and breast masses.  Past Medical History: Past Medical History  Diagnosis Date  . Hypertension   . Hypercholesteremia   . Myocardial infarction   . Pneumonia   . Anxiety    Past Surgical History  Procedure Date  . Cardiac surgery   . Total hip arthroplasty   . Pacemaker insertion     Medications: Prior to Admission medications   Medication Sig Start Date End Date Taking? Authorizing Provider  acetaminophen (TYLENOL) 500 MG tablet Take 1,000 mg by  mouth every 6 (six) hours as needed. Pain   Yes Historical Provider, MD  aspirin 81 MG tablet Take 81 mg by mouth 2 (two) times daily.     Yes Historical Provider, MD  ciprofloxacin (CIPRO) 500 MG tablet Take 500 mg by mouth 2 (two) times daily.   Yes Historical Provider, MD  fish oil-omega-3 fatty acids 1000 MG capsule Take 4 g by mouth daily.   Yes Historical Provider, MD  hydrochlorothiazide (HYDRODIURIL) 25 MG tablet Take 25 mg by mouth daily.   Yes Historical Provider, MD  metoprolol succinate (TOPROL-XL) 25 MG 24 hr tablet Take 25 mg by mouth daily.   Yes Historical Provider, MD  niacin (NIASPAN) 500 MG CR tablet Take 500 mg by mouth at bedtime.     Yes Historical Provider, MD  omeprazole (PRILOSEC) 20 MG capsule Take 20 mg by mouth daily.     Yes Historical Provider, MD  valsartan (DIOVAN) 160 MG tablet Take 160 mg by mouth daily.     Yes Historical Provider, MD    Allergies:   Allergies  Allergen Reactions  . Hydrocodone-Acetaminophen Itching    Social History:  reports that she has never smoked. She has never used smokeless tobacco. She reports that she does not drink alcohol or use illicit drugs.  Family History: Family History  Problem Relation Age of Onset  . Hypertension Mother   . Diabetes Mother   . Cancer Mother   . Hypertension Father   . Stroke Father     Physical Exam: Filed Vitals:   08/07/12 1026 08/07/12 1614 08/07/12 1836  BP: 159/64 114/51 118/53  Pulse: 69  73 69  Temp: 97.9 F (36.6 C)  97.9 F (36.6 C)  TempSrc:   Oral  Resp: 20 18   Weight: 68.04 kg (150 lb)    SpO2: 97% 96% 100%    General:  Alert and oriented times three, well developed and nourished, no acute distress Eyes: PERRLA, pink conjunctiva, no scleral icterus ENT: Moist oral mucosa, neck supple, no thyromegaly Lungs: clear to ascultation, no wheeze, no crackles, no use of accessory muscles Cardiovascular: regular rate and rhythm, no regurgitation, no gallops, no murmurs. No  carotid bruits, no JVD Abdomen: soft, positive BS, non-tender, non-distended, no organomegaly, not an acute abdomen GU: not examined Neuro: CN II - XII grossly intact, sensation intact Musculoskeletal: strength 5/5 all extremities, no clubbing, cyanosis or edema, left upper extremity in cast Skin: no rash, no subcutaneous crepitation, no decubitus Psych: appropriate patient   Labs on Admission:   Basename 08/07/12 1141  NA 136  K 3.8  CL 98  CO2 29  GLUCOSE 102*  BUN 24*  CREATININE 1.45*  CALCIUM 10.2  MG --  PHOS --    Basename 08/07/12 1141  AST 33  ALT 21  ALKPHOS 87  BILITOT 0.3  PROT 6.9  ALBUMIN 3.7   No results found for this basename: LIPASE:2,AMYLASE:2 in the last 72 hours  Basename 08/07/12 1141  WBC 5.9  NEUTROABS 4.2  HGB 13.7  HCT 40.2  MCV 88.7  PLT 182   No results found for this basename: CKTOTAL:3,CKMB:3,CKMBINDEX:3,TROPONINI:3 in the last 72 hours No components found with this basename: POCBNP:3 No results found for this basename: DDIMER:2 in the last 72 hours No results found for this basename: HGBA1C:2 in the last 72 hours No results found for this basename: CHOL:2,HDL:2,LDLCALC:2,TRIG:2,CHOLHDL:2,LDLDIRECT:2 in the last 72 hours No results found for this basename: TSH,T4TOTAL,FREET3,T3FREE,THYROIDAB in the last 72 hours No results found for this basename: VITAMINB12:2,FOLATE:2,FERRITIN:2,TIBC:2,IRON:2,RETICCTPCT:2 in the last 72 hours  Micro Results: No results found for this or any previous visit (from the past 240 hour(s)).   Radiological Exams on Admission: Dg Chest 1 View  08/07/2012  *RADIOLOGY REPORT*  Clinical Data: Fall  CHEST - 1 VIEW  Comparison: 03/25/2012  Findings: Left subclavian sequential transvenous pacemaker leads project at right atrium and right ventricle. Upper normal heart size post CABG. Calcified tortuous aorta. Pulmonary vascularity normal. Lungs grossly clear. No pleural effusion or pneumothorax. Minimal biapical  scarring. Diffuse osseous demineralization.  IMPRESSION: Post CABG and pacemaker. No acute abnormalities.   Original Report Authenticated By: Burnetta Sabin, M.D.    Dg Wrist Complete Left  08/07/2012  *RADIOLOGY REPORT*  Clinical Data: Left wrist pain and swelling post fall  LEFT WRIST - COMPLETE 3+ VIEW  Comparison: 03/07/2005  Findings: Minimally displaced ulnar styloid fracture. Comminuted distal left radial metaphyseal fracture with intra- articular extension at the radiocarpal joint. Question extension into the distal radioulnar joint as well. Dorsal tilt of the distal radial articular surface. Overlying soft tissue swelling. Minimal degenerative changes at first [facility] joint. No additional acute fracture or dislocation identified. Osseous demineralization.  IMPRESSION: Ulnar styloid fracture. Angulated mildly displaced intra-articular distal left radial metaphyseal fracture. Osseous demineralization.   Original Report Authenticated By: Burnetta Sabin, M.D.    Dg Hip Complete Left  08/07/2012  *RADIOLOGY REPORT*  Clinical Data: Left hip pain post fall  LEFT HIP - COMPLETE 2+ VIEW  Comparison: None  Findings: Minimally impacted left femoral neck fracture. Osseous mineralization appears diffusely decreased. Left hip joint space preserved. No additional  fracture or dislocation identified. Symmetric SI joints. Pelvis appears intact.  IMPRESSION: Minimally impacted left femoral neck fracture.   Original Report Authenticated By: Burnetta Sabin, M.D.    Ct Head Wo Contrast  08/07/2012  *RADIOLOGY REPORT*  Clinical Data:  Fall.  CT HEAD WITHOUT CONTRAST CT CERVICAL SPINE WITHOUT CONTRAST  Technique:  Multidetector CT imaging of the head and cervical spine was performed following the standard protocol without intravenous contrast.  Multiplanar CT image reconstructions of the cervical spine were also generated.  Comparison:   None  CT HEAD  Findings: No skull fracture or intracranial hemorrhage.  No CT evidence of  large acute infarct.  Mild small vessel disease type changes.  No hydrocephalus.  No intracranial mass lesion detected on this unenhanced exam.  Vascular calcifications.  IMPRESSION: No skull fracture or intracranial hemorrhage.  CT CERVICAL SPINE  Findings: No cervical spine fracture.  Dextroscoliosis and degenerative changes.  Spinal stenosis most notable C4-5.  Biapical lung scarring.  Carotid calcifications.  IMPRESSION: No cervical spine fracture.  Cervical spondylotic changes with spinal stenosis most notable C4- 5.   Original Report Authenticated By: Doug Sou, M.D.    Ct Cervical Spine Wo Contrast  08/07/2012  *RADIOLOGY REPORT*  Clinical Data:  Fall.  CT HEAD WITHOUT CONTRAST CT CERVICAL SPINE WITHOUT CONTRAST  Technique:  Multidetector CT imaging of the head and cervical spine was performed following the standard protocol without intravenous contrast.  Multiplanar CT image reconstructions of the cervical spine were also generated.  Comparison:   None  CT HEAD  Findings: No skull fracture or intracranial hemorrhage.  No CT evidence of large acute infarct.  Mild small vessel disease type changes.  No hydrocephalus.  No intracranial mass lesion detected on this unenhanced exam.  Vascular calcifications.  IMPRESSION: No skull fracture or intracranial hemorrhage.  CT CERVICAL SPINE  Findings: No cervical spine fracture.  Dextroscoliosis and degenerative changes.  Spinal stenosis most notable C4-5.  Biapical lung scarring.  Carotid calcifications.  IMPRESSION: No cervical spine fracture.  Cervical spondylotic changes with spinal stenosis most notable C4- 5.   Original Report Authenticated By: Doug Sou, M.D.     Assessment/Plan Present on Admission:  .Radial head fracture, closed Minimally impacted neck fracture Admit to telemetry Orthopedics Dr. Ronnie Derby aware. States patient is scheduled for noon in the OR tomorrow Pain medications, IV fluid hydration Paroxysmal A. Fib Current in sinus  rhythm Patient's ARB and hydrochlorothiazide has been held due to mild increased creatinine Will increase patient's beta blocker dose perioperatively Hypertension Coronary artery disease Hyperlipidemia Stable resume home medication Patient's a moderate risk for surgical intervention   DO NOT RESUSCITATE DVT prophylaxis  Burlin Mcnair 08/07/2012, 11:10 PM

## 2012-08-07 NOTE — ED Notes (Signed)
Report to [facility] 

## 2012-08-07 NOTE — ED Notes (Signed)
Pt c/o tripping and falling onto left side this am. Pt c/o left hip/wrist pain. Denies loc. c-collar/lsb in place.

## 2012-08-07 NOTE — Progress Notes (Signed)
Received call from Dr. Tomi Bamberger.  Patient had a fall she does not remember and has L wrist and L jip fracture.  Dr. Ronnie Derby said he will take patient to the OR once medical clearance obtained.  Previous MI 3 years ago per Dr. Tomi Bamberger  VS: BP 159/64, HR: [place] DO

## 2012-08-08 ENCOUNTER — Encounter (HOSPITAL_COMMUNITY): Payer: Self-pay | Admitting: *Deleted

## 2012-08-08 ENCOUNTER — Encounter (HOSPITAL_COMMUNITY)
Admission: EM | Disposition: A | Discharge status: Other Acute Inpatient Hospital | Payer: Self-pay | Source: Home / Self Care | Attending: Internal Medicine

## 2012-08-08 ENCOUNTER — Inpatient Hospital Stay (HOSPITAL_COMMUNITY): Payer: Medicare Other | Admitting: *Deleted

## 2012-08-08 ENCOUNTER — Inpatient Hospital Stay (HOSPITAL_COMMUNITY): Admission: RE | Admit: 2012-08-08 | Payer: Medicare Other | Source: Ambulatory Visit | Admitting: Orthopedic Surgery

## 2012-08-08 ENCOUNTER — Inpatient Hospital Stay (HOSPITAL_COMMUNITY): Payer: Medicare Other

## 2012-08-08 ENCOUNTER — Encounter (HOSPITAL_COMMUNITY): Payer: Self-pay | Admitting: Cardiology

## 2012-08-08 DIAGNOSIS — N1832 Chronic kidney disease, stage 3b: Secondary | ICD-10-CM | POA: Diagnosis present

## 2012-08-08 DIAGNOSIS — I358 Other nonrheumatic aortic valve disorders: Secondary | ICD-10-CM | POA: Diagnosis present

## 2012-08-08 DIAGNOSIS — Z950 Presence of cardiac pacemaker: Secondary | ICD-10-CM | POA: Diagnosis present

## 2012-08-08 DIAGNOSIS — Z87898 Personal history of other specified conditions: Secondary | ICD-10-CM | POA: Diagnosis present

## 2012-08-08 DIAGNOSIS — I82409 Acute embolism and thrombosis of unspecified deep veins of unspecified lower extremity: Secondary | ICD-10-CM | POA: Diagnosis present

## 2012-08-08 DIAGNOSIS — N183 Chronic kidney disease, stage 3 unspecified: Secondary | ICD-10-CM | POA: Diagnosis present

## 2012-08-08 DIAGNOSIS — Z01818 Encounter for other preprocedural examination: Secondary | ICD-10-CM | POA: Insufficient documentation

## 2012-08-08 DIAGNOSIS — R0989 Other specified symptoms and signs involving the circulatory and respiratory systems: Secondary | ICD-10-CM | POA: Diagnosis present

## 2012-08-08 DIAGNOSIS — S72002A Fracture of unspecified part of neck of left femur, initial encounter for closed fracture: Secondary | ICD-10-CM | POA: Diagnosis present

## 2012-08-08 DIAGNOSIS — S72009A Fracture of unspecified part of neck of unspecified femur, initial encounter for closed fracture: Secondary | ICD-10-CM

## 2012-08-08 DIAGNOSIS — Z87440 Personal history of urinary (tract) infections: Secondary | ICD-10-CM | POA: Diagnosis present

## 2012-08-08 DIAGNOSIS — Z96649 Presence of unspecified artificial hip joint: Secondary | ICD-10-CM

## 2012-08-08 DIAGNOSIS — I480 Paroxysmal atrial fibrillation: Secondary | ICD-10-CM | POA: Diagnosis present

## 2012-08-08 DIAGNOSIS — S52502A Unspecified fracture of the lower end of left radius, initial encounter for closed fracture: Secondary | ICD-10-CM | POA: Diagnosis present

## 2012-08-08 HISTORY — DX: Presence of unspecified artificial hip joint: Z96.649

## 2012-08-08 HISTORY — PX: CARPAL TUNNEL RELEASE: SHX101

## 2012-08-08 HISTORY — PX: HIP ARTHROPLASTY: SHX981

## 2012-08-08 LAB — URINE CULTURE

## 2012-08-08 LAB — SURGICAL PCR SCREEN: MRSA, PCR: NEGATIVE

## 2012-08-08 LAB — CBC
Hemoglobin: 11.5 g/dL — ABNORMAL LOW (ref 12.0–15.0)
MCH: 29.9 pg (ref 26.0–34.0)
MCHC: 33.5 g/dL (ref 30.0–36.0)
MCV: 89.1 fL (ref 78.0–100.0)

## 2012-08-08 LAB — BASIC METABOLIC PANEL
BUN: 23 mg/dL (ref 6–23)
GFR calc non Af Amer: 35 mL/min — ABNORMAL LOW (ref >90)
Glucose, Bld: 96 mg/dL (ref 70–99)
Potassium: 4.3 mEq/L (ref 3.5–5.1)

## 2012-08-08 SURGERY — OPEN REDUCTION INTERNAL FIXATION (ORIF) DISTAL RADIUS FRACTURE
Anesthesia: General | Site: Wrist | Laterality: Left | Wound class: Clean

## 2012-08-08 MED ORDER — SODIUM CHLORIDE 0.9 % IV SOLN: INTRAVENOUS | Status: DC

## 2012-08-08 MED ORDER — ENOXAPARIN SODIUM 40 MG/0.4ML ~~LOC~~ SOLN: 40.0000 mg | SUBCUTANEOUS | Status: DC

## 2012-08-08 MED ORDER — DIPHENHYDRAMINE HCL 50 MG/ML IJ SOLN
INTRAMUSCULAR | Status: DC | PRN
  Administered 2012-08-08: 25 mg via INTRAVENOUS

## 2012-08-08 MED ORDER — NEOSTIGMINE METHYLSULFATE 1 MG/ML IJ SOLN
INTRAMUSCULAR | Status: DC | PRN
  Administered 2012-08-08: 3 mg via INTRAVENOUS

## 2012-08-08 MED ORDER — HYDROMORPHONE HCL PF 1 MG/ML IJ SOLN
0.2500 mg | INTRAMUSCULAR | Status: DC | PRN
  Administered 2012-08-08 – 2012-08-09 (×5): 0.5 mg via INTRAVENOUS
  Filled 2012-08-08 (×2): qty 1

## 2012-08-08 MED ORDER — BUPIVACAINE HCL (PF) 0.25 % IJ SOLN
INTRAMUSCULAR | Status: DC | PRN
  Administered 2012-08-08: 9 mL

## 2012-08-08 MED ORDER — LIDOCAINE HCL 4 % MT SOLN
OROMUCOSAL | Status: DC | PRN
  Administered 2012-08-08: 4 mL via TOPICAL

## 2012-08-08 MED ORDER — LACTATED RINGERS IV SOLN
INTRAVENOUS | Status: DC | PRN
  Administered 2012-08-08 (×2): via INTRAVENOUS

## 2012-08-08 MED ORDER — SODIUM CHLORIDE 0.9 % IR SOLN
Status: DC | PRN
  Administered 2012-08-08 (×2): 1000 mL

## 2012-08-08 MED ORDER — FENTANYL CITRATE 0.05 MG/ML IJ SOLN
INTRAMUSCULAR | Status: DC | PRN
  Administered 2012-08-08 (×4): 50 ug via INTRAVENOUS

## 2012-08-08 MED ORDER — CHLORHEXIDINE GLUCONATE 4 % EX LIQD
60.0000 mL | Freq: Once | CUTANEOUS | Status: DC
  Filled 2012-08-08: qty 60

## 2012-08-08 MED ORDER — LIDOCAINE HCL (CARDIAC) 20 MG/ML IV SOLN
INTRAVENOUS | Status: DC | PRN
  Administered 2012-08-08: 50 mg via INTRAVENOUS
  Administered 2012-08-08: 100 mg via INTRAVENOUS

## 2012-08-08 MED ORDER — CEFAZOLIN SODIUM-DEXTROSE 2-3 GM-% IV SOLR
INTRAVENOUS | Status: DC | PRN
  Administered 2012-08-08: 2 g via INTRAVENOUS

## 2012-08-08 MED ORDER — EPHEDRINE SULFATE 50 MG/ML IJ SOLN
INTRAMUSCULAR | Status: DC | PRN
  Administered 2012-08-08 (×3): 5 mg via INTRAVENOUS

## 2012-08-08 MED ORDER — CEFAZOLIN SODIUM 1-5 GM-% IV SOLN
INTRAVENOUS | Status: AC
  Filled 2012-08-08: qty 100

## 2012-08-08 MED ORDER — ONDANSETRON HCL 4 MG/2ML IJ SOLN
INTRAMUSCULAR | Status: DC | PRN
  Administered 2012-08-08: 4 mg via INTRAVENOUS

## 2012-08-08 MED ORDER — BUPIVACAINE HCL (PF) 0.25 % IJ SOLN
INTRAMUSCULAR | Status: AC
  Filled 2012-08-08: qty 30

## 2012-08-08 MED ORDER — ONDANSETRON HCL 4 MG/2ML IJ SOLN: 4.0000 mg | Freq: Once | INTRAMUSCULAR | Status: AC | PRN

## 2012-08-08 MED ORDER — HYDROMORPHONE HCL PF 1 MG/ML IJ SOLN
INTRAMUSCULAR | Status: AC
  Administered 2012-08-08: 0.5 mg via INTRAVENOUS
  Filled 2012-08-08: qty 1

## 2012-08-08 MED ORDER — PHENYLEPHRINE HCL 10 MG/ML IJ SOLN
INTRAMUSCULAR | Status: DC | PRN
  Administered 2012-08-08 (×2): 80 ug via INTRAVENOUS
  Administered 2012-08-08: 40 ug via INTRAVENOUS

## 2012-08-08 MED ORDER — GLYCOPYRROLATE 0.2 MG/ML IJ SOLN
INTRAMUSCULAR | Status: DC | PRN
  Administered 2012-08-08: .4 mg via INTRAVENOUS

## 2012-08-08 MED ORDER — ROCURONIUM BROMIDE 100 MG/10ML IV SOLN
INTRAVENOUS | Status: DC | PRN
  Administered 2012-08-08: 50 mg via INTRAVENOUS

## 2012-08-08 SURGICAL SUPPLY — 81 items
APL SKNCLS STERI-STRIP NONHPOA (GAUZE/BANDAGES/DRESSINGS) ×3
BANDAGE ELASTIC 3 VELCRO ST LF (GAUZE/BANDAGES/DRESSINGS) ×4 IMPLANT
BANDAGE ELASTIC 4 VELCRO ST LF (GAUZE/BANDAGES/DRESSINGS) ×4 IMPLANT
BANDAGE GAUZE ELAST BULKY 4 IN (GAUZE/BANDAGES/DRESSINGS) ×4 IMPLANT
BENZOIN TINCTURE PRP APPL 2/3 (GAUZE/BANDAGES/DRESSINGS) ×4 IMPLANT
BIT DRILL 2 FAST STEP (BIT) ×4 IMPLANT
BIT DRILL 2.5X4 QC (BIT) ×4 IMPLANT
BLADE SAW RECIP 87.9 MT (BLADE) IMPLANT
BLADE SAW SAG 73X25 THK (BLADE)
BLADE SAW SGTL 73X25 THK (BLADE) IMPLANT
BNDG COHESIVE 6X5 TAN STRL LF (GAUZE/BANDAGES/DRESSINGS) ×4 IMPLANT
CLOTH BEACON ORANGE TIMEOUT ST (SAFETY) ×4 IMPLANT
CLSR STERI-STRIP ANTIMIC 1/2X4 (GAUZE/BANDAGES/DRESSINGS) ×4 IMPLANT
COVER BACK TABLE 24X17X13 BIG (DRAPES) IMPLANT
COVER SURGICAL LIGHT HANDLE (MISCELLANEOUS) ×4 IMPLANT
DRAPE INCISE IOBAN 66X45 STRL (DRAPES) ×8 IMPLANT
DRAPE ORTHO SPLIT 77X108 STRL (DRAPES) ×8
DRAPE SURG ORHT 6 SPLT 77X108 (DRAPES) ×6 IMPLANT
DRAPE U-SHAPE 47X51 STRL (DRAPES) ×4 IMPLANT
DRILL BIT 7/64X5 (BIT) ×4 IMPLANT
DRSG ADAPTIC 3X8 NADH LF (GAUZE/BANDAGES/DRESSINGS) ×4 IMPLANT
DRSG MEPILEX BORDER 4X12 (GAUZE/BANDAGES/DRESSINGS) ×4 IMPLANT
DRSG PAD ABDOMINAL 8X10 ST (GAUZE/BANDAGES/DRESSINGS) ×4 IMPLANT
DURAPREP 26ML APPLICATOR (WOUND CARE) ×4 IMPLANT
ELECT CAUTERY BLADE 6.4 (BLADE) ×4 IMPLANT
ELECT REM PT RETURN 9FT ADLT (ELECTROSURGICAL) ×4
ELECTRODE REM PT RTRN 9FT ADLT (ELECTROSURGICAL) ×3 IMPLANT
GAUZE XEROFORM 1X8 LF (GAUZE/BANDAGES/DRESSINGS) ×4 IMPLANT
GLOVE BIO SURGEON STRL SZ 6.5 (GLOVE) ×4 IMPLANT
GLOVE BIOGEL M 7.0 STRL (GLOVE) ×4 IMPLANT
GLOVE BIOGEL PI IND STRL 6.5 (GLOVE) ×6 IMPLANT
GLOVE BIOGEL PI IND STRL 7.5 (GLOVE) ×3 IMPLANT
GLOVE BIOGEL PI IND STRL 8.5 (GLOVE) ×6 IMPLANT
GLOVE BIOGEL PI INDICATOR 6.5 (GLOVE) ×2
GLOVE BIOGEL PI INDICATOR 7.5 (GLOVE) ×1
GLOVE BIOGEL PI INDICATOR 8.5 (GLOVE) ×2
GLOVE ECLIPSE 9.0 STRL (GLOVE) IMPLANT
GLOVE SURG ORTHO 7.0 STRL STRW (GLOVE) IMPLANT
GLOVE SURG ORTHO 8.0 STRL STRW (GLOVE) ×8 IMPLANT
GLOVE SURG SS PI 6.0 STRL IVOR (GLOVE) ×4 IMPLANT
GOWN PREVENTION PLUS XLARGE (GOWN DISPOSABLE) ×4 IMPLANT
GOWN STRL NON-REIN LRG LVL3 (GOWN DISPOSABLE) ×8 IMPLANT
HOOD PEEL AWAY FACE SHEILD DIS (HOOD) IMPLANT
KIT BASIN OR (CUSTOM PROCEDURE TRAY) ×4 IMPLANT
KIT ROOM TURNOVER OR (KITS) ×4 IMPLANT
MANIFOLD NEPTUNE II (INSTRUMENTS) ×4 IMPLANT
NDL SUT 2 .5 CRC MAYO 1.732X (NEEDLE) IMPLANT
NEEDLE HYPO 25GX1X1/2 BEV (NEEDLE) ×4 IMPLANT
NEEDLE MAYO TAPER (NEEDLE)
NS IRRIG 1000ML POUR BTL (IV SOLUTION) ×4 IMPLANT
PACK TOTAL JOINT (CUSTOM PROCEDURE TRAY) ×4 IMPLANT
PAD ARMBOARD 7.5X6 YLW CONV (MISCELLANEOUS) ×8 IMPLANT
PAD CAST 4YDX4 CTTN HI CHSV (CAST SUPPLIES) ×3 IMPLANT
PADDING CAST COTTON 4X4 STRL (CAST SUPPLIES) ×4
PASSER SUT SWANSON 36MM LOOP (INSTRUMENTS) ×8 IMPLANT
PEG SUBCHONDRAL SMOOTH 2.0X20 (Peg) ×4 IMPLANT
PEG SUBCHONDRAL SMOOTH 2.0X22 (Peg) ×20 IMPLANT
PEG SUBCHONDRAL SMOOTH 2.0X24 (Peg) ×4 IMPLANT
PILLOW ABDUCTION HIP (SOFTGOODS) IMPLANT
PLATE STAN 24.4X59.5 LT (Plate) ×4 IMPLANT
SCREW BN 12X3.5XNS CORT TI (Screw) ×6 IMPLANT
SCREW CORT 3.5X10 LNG (Screw) ×4 IMPLANT
SCREW CORT 3.5X12 (Screw) ×8 IMPLANT
SPONGE GAUZE 4X4 12PLY (GAUZE/BANDAGES/DRESSINGS) ×4 IMPLANT
STAPLER VISISTAT 35W (STAPLE) ×4 IMPLANT
SUCTION FRAZIER TIP 10 FR DISP (SUCTIONS) ×4 IMPLANT
SUT ETHIBOND 2 V 37 (SUTURE) ×4 IMPLANT
SUT VIC AB 0 CT1 27 (SUTURE) ×4
SUT VIC AB 0 CT1 27XBRD ANBCTR (SUTURE) ×3 IMPLANT
SUT VIC AB 0 CTX 27 (SUTURE) ×4 IMPLANT
SUT VIC AB 1 CTB1 27 (SUTURE) ×4 IMPLANT
SUT VIC AB 2-0 CT1 27 (SUTURE) ×8
SUT VIC AB 2-0 CT1 TAPERPNT 27 (SUTURE) ×6 IMPLANT
SUT VIC AB 2-0 CTB1 (SUTURE) ×4 IMPLANT
SUT VIC AB 2-0 FS1 27 (SUTURE) ×4 IMPLANT
SUT VICRYL RAPIDE 4/0 PS 2 (SUTURE) ×4 IMPLANT
SYR CONTROL 10ML LL (SYRINGE) ×4 IMPLANT
TOWEL OR 17X24 6PK STRL BLUE (TOWEL DISPOSABLE) ×4 IMPLANT
TOWEL OR 17X26 10 PK STRL BLUE (TOWEL DISPOSABLE) ×4 IMPLANT
TRAY FOLEY CATH 14FR (SET/KITS/TRAYS/PACK) ×4 IMPLANT
WATER STERILE IRR 1000ML POUR (IV SOLUTION) ×8 IMPLANT

## 2012-08-08 NOTE — OR Nursing (Signed)
Procedure 1 ORIF Left Radius and Left Carpal Tunnel Release completed at 1434.  Procedure 2 Left Hip Arthroplasty started at 1455.

## 2012-08-08 NOTE — Op Note (Signed)
See dictated note [id number]

## 2012-08-08 NOTE — Consult Note (Signed)
NAME: Amy [name] MRN:   [id number] DOB:   1921-12-23     HISTORY AND PHYSICAL  CHIEF COMPLAINT: hip pain s/p fall  HISTORY:   Amy M Stoneis a 78 y.o. female  with left  Hip Pain Patient complains of left hip pain. Onset of the symptoms was yesterday. Inciting event: fell while walking. The patient reports the hip pain is worse with weight bearing.  PAST MEDICAL HISTORY:   Past Medical History  Diagnosis Date  . Hypertension   . Hypercholesteremia   . Myocardial infarction   . Pneumonia   . Anxiety     PAST SURGICAL HISTORY:   Past Surgical History  Procedure Date  . Cardiac surgery   . Total hip arthroplasty   . Pacemaker insertion     MEDICATIONS:   Medications Prior to Admission  Medication Sig Dispense Refill  . acetaminophen (TYLENOL) 500 MG tablet Take 1,000 mg by mouth every 6 (six) hours as needed. Pain      . aspirin 81 MG tablet Take 81 mg by mouth 2 (two) times daily.        . ciprofloxacin (CIPRO) 500 MG tablet Take 500 mg by mouth 2 (two) times daily.      . fish oil-omega-3 fatty acids 1000 MG capsule Take 4 g by mouth daily.      . hydrochlorothiazide (HYDRODIURIL) 25 MG tablet Take 25 mg by mouth daily.      . metoprolol succinate (TOPROL-XL) 25 MG 24 hr tablet Take 25 mg by mouth daily.      . niacin (NIASPAN) 500 MG CR tablet Take 500 mg by mouth at bedtime.        Marland Kitchen omeprazole (PRILOSEC) 20 MG capsule Take 20 mg by mouth daily.        . valsartan (DIOVAN) 160 MG tablet Take 160 mg by mouth daily.          ALLERGIES:   Allergies  Allergen Reactions  . Hydrocodone-Acetaminophen Itching    REVIEW OF SYSTEMS:   Negative except hpi  FAMILY HISTORY:   Family History  Problem Relation Age of Onset  . Hypertension Mother   . Diabetes Mother   . Cancer Mother   . Hypertension Father   . Stroke Father     SOCIAL HISTORY:   reports that she has never smoked. She has never used smokeless tobacco. She reports that she does not drink alcohol or use  illicit drugs.  PHYSICAL EXAM:  General appearance: alert, cooperative and no distress Resp: clear to auscultation bilaterally Cardio: regular rate and rhythm, S1, S2 normal, no murmur, click, rub or gallop GI: soft, non-tender; bowel sounds normal; no masses,  no organomegaly Extremities: Homans sign is negative, no sign of DVT Pulses: 2+ and symmetric Skin: Skin color, texture, turgor normal. No rashes or lesions Neurologic: Alert and oriented X 3, normal strength and tone. Normal symmetric reflexes. Normal coordination and gait    LABORATORY STUDIES:  Basename 08/08/12 0600 September 04, 2012 1141  WBC 7.0 5.9  HGB 11.5* 13.7  HCT 34.3* 40.2  PLT 166 182     Basename 09-04-12 1141  NA 136  K 3.8  CL 98  CO2 29  GLUCOSE 102*  BUN 24*  CREATININE 1.45*  CALCIUM 10.2    STUDIES/RESULTS:  Dg Chest 1 View  09/04/12  *RADIOLOGY REPORT*  Clinical Data: Fall  CHEST - 1 VIEW  Comparison: 03/25/2012  Findings: Left subclavian sequential transvenous pacemaker leads project at right atrium  and right ventricle. Upper normal heart size post CABG. Calcified tortuous aorta. Pulmonary vascularity normal. Lungs grossly clear. No pleural effusion or pneumothorax. Minimal biapical scarring. Diffuse osseous demineralization.  IMPRESSION: Post CABG and pacemaker. No acute abnormalities.   Original Report Authenticated By: Burnetta Sabin, M.D.    Dg Wrist Complete Left  08/07/2012  *RADIOLOGY REPORT*  Clinical Data: Left wrist pain and swelling post fall  LEFT WRIST - COMPLETE 3+ VIEW  Comparison: 03/07/2005  Findings: Minimally displaced ulnar styloid fracture. Comminuted distal left radial metaphyseal fracture with intra- articular extension at the radiocarpal joint. Question extension into the distal radioulnar joint as well. Dorsal tilt of the distal radial articular surface. Overlying soft tissue swelling. Minimal degenerative changes at first [facility] joint. No additional acute fracture or dislocation  identified. Osseous demineralization.  IMPRESSION: Ulnar styloid fracture. Angulated mildly displaced intra-articular distal left radial metaphyseal fracture. Osseous demineralization.   Original Report Authenticated By: Burnetta Sabin, M.D.    Dg Hip Complete Left  08/07/2012  *RADIOLOGY REPORT*  Clinical Data: Left hip pain post fall  LEFT HIP - COMPLETE 2+ VIEW  Comparison: None  Findings: Minimally impacted left femoral neck fracture. Osseous mineralization appears diffusely decreased. Left hip joint space preserved. No additional fracture or dislocation identified. Symmetric SI joints. Pelvis appears intact.  IMPRESSION: Minimally impacted left femoral neck fracture.   Original Report Authenticated By: Burnetta Sabin, M.D.    Ct Head Wo Contrast  08/07/2012  *RADIOLOGY REPORT*  Clinical Data:  Fall.  CT HEAD WITHOUT CONTRAST CT CERVICAL SPINE WITHOUT CONTRAST  Technique:  Multidetector CT imaging of the head and cervical spine was performed following the standard protocol without intravenous contrast.  Multiplanar CT image reconstructions of the cervical spine were also generated.  Comparison:   None  CT HEAD  Findings: No skull fracture or intracranial hemorrhage.  No CT evidence of large acute infarct.  Mild small vessel disease type changes.  No hydrocephalus.  No intracranial mass lesion detected on this unenhanced exam.  Vascular calcifications.  IMPRESSION: No skull fracture or intracranial hemorrhage.  CT CERVICAL SPINE  Findings: No cervical spine fracture.  Dextroscoliosis and degenerative changes.  Spinal stenosis most notable C4-5.  Biapical lung scarring.  Carotid calcifications.  IMPRESSION: No cervical spine fracture.  Cervical spondylotic changes with spinal stenosis most notable C4- 5.   Original Report Authenticated By: Doug Sou, M.D.    Ct Cervical Spine Wo Contrast  08/07/2012  *RADIOLOGY REPORT*  Clinical Data:  Fall.  CT HEAD WITHOUT CONTRAST CT CERVICAL SPINE WITHOUT CONTRAST   Technique:  Multidetector CT imaging of the head and cervical spine was performed following the standard protocol without intravenous contrast.  Multiplanar CT image reconstructions of the cervical spine were also generated.  Comparison:   None  CT HEAD  Findings: No skull fracture or intracranial hemorrhage.  No CT evidence of large acute infarct.  Mild small vessel disease type changes.  No hydrocephalus.  No intracranial mass lesion detected on this unenhanced exam.  Vascular calcifications.  IMPRESSION: No skull fracture or intracranial hemorrhage.  CT CERVICAL SPINE  Findings: No cervical spine fracture.  Dextroscoliosis and degenerative changes.  Spinal stenosis most notable C4-5.  Biapical lung scarring.  Carotid calcifications.  IMPRESSION: No cervical spine fracture.  Cervical spondylotic changes with spinal stenosis most notable C4- 5.   Original Report Authenticated By: Doug Sou, M.D.     ASSESSMENT:  Impacted femoral neck fx left hip  Active Problems:  Radial head fracture, closed  Hypertension  Hyperlipidemia  CAD (coronary artery disease)    PLAN:  Left Hemiarthroplasty at [place] [facility] surgeon for wrist  Admitted by medicine   Kelleigh Skerritt 08/08/2012. 7:28 AM

## 2012-08-08 NOTE — Brief Op Note (Signed)
08/07/2012 - 08/08/2012  2:40 PM  PATIENT:  Amy [name]  78 y.o. female  PRE-OPERATIVE DIAGNOSIS:  FRACTURED LEFT HIP  POST-OPERATIVE DIAGNOSIS:  * No post-op diagnosis entered *  PROCEDURE:  Procedure(s) (LRB) with comments: OPEN REDUCTION INTERNAL FIXATION (ORIF) DISTAL RADIAL FRACTURE (Left) ARTHROPLASTY BIPOLAR HIP (Left) - [place]   SURGEON:  Surgeon(s) and Role: Panel 1:    * Schuyler Amor, MD - Primary  Panel 2:    * Rudean Haskell, MD - Primary  PHYSICIAN ASSISTANT:   ASSISTANTS: none   ANESTHESIA:   general  EBL:  Total I/O In: 503 [I.V.:503] Out: -   BLOOD ADMINISTERED:none  DRAINS: none   LOCAL MEDICATIONS USED:  MARCAINE   10cc  SPECIMEN:  No Specimen  DISPOSITION OF SPECIMEN:  N/A  COUNTS:  YES  TOURNIQUET:  * Missing tourniquet times found for documented tourniquets in log:  [id number] *  DICTATION: .Other Dictation: Dictation Number [id number]  PLAN OF CARE: Admit to inpatient   PATIENT DISPOSITION:  PACU - hemodynamically stable.   Delay start of Pharmacological VTE agent (>24hrs) due to surgical blood loss or risk of bleeding: patient on medicine service  They will order this

## 2012-08-08 NOTE — Consult Note (Signed)
Reason for Consult: Pre op clearance  Requesting Physician: Mary Sella  HPI: This is a pleasant 78 y.o. female followed by Dr Sharilyn Sites in [place], and by Dr Rollene Fare. She has a history of CAD, S/P remote RCA stent followed by CABG X 02 Aug 2008. She had LIMA-LAD, SVG-RI, SVG-RCA. Her last Myoview was Oct 2010 and was low risk. Echo June 2013 showed an EF > 55% with Ao valve sclerosis. She has been doing well from a cardiac standpoint. She is a widow and lives in her own home in [place]. She still drives occasionally. Her daughter lives nearby and visits daily. She was diagnosed with a UTI 10 days ago and just finished a course of Cipro. She has been complaining of "dizziness" recently but denies any syncope. Yesterday she was walking out her back steps and found herself on the sidewalk. She doesn't remember tripping. She denies any chest or unusual SOB. EMS said her HR was "irregular" but I couldn't find any strips.  PMHx:  Past Medical History  Diagnosis Date  . Hypertension   . Hypercholesteremia   . Myocardial infarction     Inferior STEMI 07/2008 - PCI of prox RCA followed byu CABG x 3 in 9/'09  . Pneumonia   . Anxiety   . CAD in native artery     s/p CABG x 3; Last Myoview in 07/2009 - non-ischemic; Echo 03/2012  Aortic Sclerosis with normal EF.   Past Surgical History  Procedure Date  . Coronary artery bypass graft 2009    LIMA-LAD, SVG-RI, SVG-stented RCA  . Total hip arthroplasty   . Pacemaker insertion     FAMHx: Family History  Problem Relation Age of Onset  . Hypertension Mother   . Diabetes Mother   . Cancer Mother   . Hypertension Father   . Stroke Father   Essentially non-contributory in a 78 y/o patient.  SOCHx:  reports that she has never smoked. She has never used smokeless tobacco. She reports that she does not drink alcohol or use illicit drugs.  ALLERGIES: Allergies  Allergen Reactions  . Hydrocodone-Acetaminophen Itching   ROS: Pertinent  items are noted in HPI. She was told by her PMD that she "fluid" behind her ear  HOME MEDICATIONS: Prescriptions prior to admission  Medication Sig Dispense Refill  . acetaminophen (TYLENOL) 500 MG tablet Take 1,000 mg by mouth every 6 (six) hours as needed. Pain      . aspirin 81 MG tablet Take 81 mg by mouth 2 (two) times daily.        . ciprofloxacin (CIPRO) 500 MG tablet Take 500 mg by mouth 2 (two) times daily.      . fish oil-omega-3 fatty acids 1000 MG capsule Take 4 g by mouth daily.      . hydrochlorothiazide (HYDRODIURIL) 25 MG tablet Take 25 mg by mouth daily.      . metoprolol succinate (TOPROL-XL) 25 MG 24 hr tablet Take 25 mg by mouth daily.      . niacin (NIASPAN) 500 MG CR tablet Take 500 mg by mouth at bedtime.        Marland Kitchen omeprazole (PRILOSEC) 20 MG capsule Take 20 mg by mouth daily.        . valsartan (DIOVAN) 160 MG tablet Take 160 mg by mouth daily.          HOSPITAL MEDICATIONS: I have reviewed the patient's current medications.  VITALS: Blood pressure 104/39, pulse 76, temperature 98.6 F (37 C), temperature  source Oral, resp. rate 18, weight 68.04 kg (150 lb), SpO2 99.00%.  PHYSICAL EXAM: General appearance: alert, cooperative and no distress Neck: bilat carotid bruits Lungs: clear to auscultation bilaterally Heart: regular rate and rhythm and 2/6 systolic murmur Aov Abdomen: soft, non-tender; bowel sounds normal; no masses,  no organomegaly Extremities: no edema Pulses: 2+ and symmetric Skin: Skin color, texture, turgor normal. No rashes or lesions Neurologic: Grossly normal  LABS: Results for orders placed during the hospital encounter of 08/07/12 (from the past 48 hour(s))  URINALYSIS, MICROSCOPIC ONLY     Status: Normal   Collection Time   08/07/12 11:24 AM      Component Value Range Comment   Color, Urine YELLOW  YELLOW    APPearance CLEAR  CLEAR    Specific Gravity, Urine 1.020  1.005 - 1.030    pH 7.0  5.0 - 8.0    Glucose, UA NEGATIVE  NEGATIVE  mg/dL    Hgb urine dipstick NEGATIVE  NEGATIVE    Bilirubin Urine NEGATIVE  NEGATIVE    Ketones, ur NEGATIVE  NEGATIVE mg/dL    Protein, ur NEGATIVE  NEGATIVE mg/dL    Urobilinogen, UA 0.2  0.0 - 1.0 mg/dL    Nitrite NEGATIVE  NEGATIVE    Leukocytes, UA NEGATIVE  NEGATIVE   BASIC METABOLIC PANEL     Status: Abnormal   Collection Time   08/08/12  6:00 AM      Component Value Range Comment   Sodium 134 (*) 135 - 145 mEq/L    Potassium 4.3  3.5 - 5.1 mEq/L    Chloride 99  96 - 112 mEq/L    CO2 25  19 - 32 mEq/L    Glucose, Bld 96  70 - 99 mg/dL    BUN 23  6 - 23 mg/dL    Creatinine, Ser 1.32 (*) 0.50 - 1.10 mg/dL    Calcium 9.6  8.4 - 10.5 mg/dL    GFR calc non Af Amer 35 (*) >90 mL/min    GFR calc Af Amer 40 (*) >90 mL/min   CBC     Status: Abnormal   Collection Time   08/08/12  6:00 AM      Component Value Range Comment   WBC 7.0  4.0 - 10.5 K/uL    RBC 3.85 (*) 3.87 - 5.11 MIL/uL    Hemoglobin 11.5 (*) 12.0 - 15.0 g/dL    HCT 34.3 (*) 36.0 - 46.0 %    MCV 89.1  78.0 - 100.0 fL    MCH 29.9  26.0 - 34.0 pg    MCHC 33.5  30.0 - 36.0 g/dL    RDW 13.3  11.5 - 15.5 %    Platelets 166  150 - 400 K/uL     IMAGING: Dg Chest 1 View  08/07/2012  *RADIOLOGY REPORT*  Clinical Data: Fall  CHEST - 1 VIEW  Comparison: 03/25/2012  Findings: Left subclavian sequential transvenous pacemaker leads project at right atrium and right ventricle. Upper normal heart size post CABG. Calcified tortuous aorta. Pulmonary vascularity normal. Lungs grossly clear. No pleural effusion or pneumothorax. Minimal biapical scarring. Diffuse osseous demineralization.  IMPRESSION: Post CABG and pacemaker. No acute abnormalities.   Original Report Authenticated By: Burnetta Sabin, M.D.    EKG- A paced  IMPRESSION: Principal Problem:  *Pre-operative clearance Active Problems:  Radial head fracture, closed, Lt  Hypertension  Hyperlipidemia, statin intol.  CAD, CABG X 3 10/09. Myoview low risk 10/10. EF >55% 2D  6/13.  Fracture of left hip after fall at home.  Aortic valve sclerosis, no stenosis  Carotid bruit present bilat, dopplers OK 6/13  UTI, just finnished 10 days of Cipro  Hx of dizziness, ? orthostatic in setting of UTI ? mild dehydration  Chronic renal insufficiency, stage III (moderate), SCr 1.4 Sept 2013  PAF/SSS  Pacemaker March 2010 (MDT)  DVT after previous hip surgery   RECOMMENDATION: MD to see for pre op clearance.  It's possible she had orthostatic syncope secondary to mild dehydration (she is on a diuretic at home), UTI, and ? Inner ear dizziness,  Hold diuretic and Diovan for now, continue Toprol. We will follow post op.  Time Spent Directly with Patient: 45 minutes  LUKE K. [place], [place] 08/08/2012.  1039  I have reviewed the chart & discussed the patient with Mr. Rosalyn Gess.  Unfortunately, she had already gone to the OR before I was able to personally see & examine her.  I will see her post-operatively.  She is a 78 y/o woman with h/o CAD-CABG as described -- latest Myoview & Echo have been "normal".  Per Mr. Rosalyn Gess, she has not noted any active cardiac Sx of Angina pr Dyspnea @ rest or exertion, no PND/orhtopnea or significant edema. EMS noted possible irregular Heart rhythm but no recordings present to evaluate.  She has noted dizziness but no syncope -- ? If this could be due to potential dehydration with diuresis in setting of UTI.  She also has inner ear fullness that could be contributing.     Her only Cardiac RF (other than age) is her h/o CAD -- with no active symptoms.  She had normal carotid dopplers, Cr < 2, non-diabetic, no CHF.  Her Surgery is Intermediate Risk. Based upon the Revised Cardiac Risk Index (by Veva Holes al) she is a Low Risk patient for adverse cardiac outcomes.  At this point, as the nature of her injury makes her surgery Urgent & time is of the essence with no active cardiac symptoms (albeit in a relatively inactive patient) and relatively normal  evaluation to date since her MI,  I agree with proceeding directly to the OR without further evaluation.  I see no reason to hold up her necessary ORIF of her Hip.    Leonie Man, M.D., M.S. [facility] [address] [place], [place]  [address]  [phone] Pager # [phone] 08/08/2012 12:43 PM

## 2012-08-08 NOTE — Preoperative (Signed)
Beta Blockers   Reason not to administer Beta Blockers:Not Applicable 

## 2012-08-08 NOTE — Anesthesia Postprocedure Evaluation (Signed)
  Anesthesia Post-op Note  Patient: [name]  Procedure(s) Performed: Procedure(s) (LRB) with comments: OPEN REDUCTION INTERNAL FIXATION (ORIF) DISTAL RADIAL FRACTURE (Left) ARTHROPLASTY BIPOLAR HIP (Left) - Zimmer  CARPAL TUNNEL RELEASE (Left)  Patient Location: PACU  Anesthesia Type: General  Level of Consciousness: awake, alert , oriented and patient cooperative  Airway and Oxygen Therapy: Patient Spontanous Breathing and Patient connected to nasal cannula oxygen  Post-op Pain: mild  Post-op Assessment: Post-op Vital signs reviewed, Patient's Cardiovascular Status Stable, Respiratory Function Stable, Patent Airway, No signs of Nausea or vomiting and Pain level controlled  Post-op Vital Signs: stable  Complications: No apparent anesthesia complications

## 2012-08-08 NOTE — Transfer of Care (Signed)
Immediate Anesthesia Transfer of Care Note  Patient: Amy [name]  Procedure(s) Performed: Procedure(s) (LRB) with comments: OPEN REDUCTION INTERNAL FIXATION (ORIF) DISTAL RADIAL FRACTURE (Left) ARTHROPLASTY BIPOLAR HIP (Left) - Zimmer  CARPAL TUNNEL RELEASE (Left)  Patient Location: PACU  Anesthesia Type: General  Level of Consciousness: awake  Airway & Oxygen Therapy: Patient Spontanous Breathing and Patient connected to face mask oxygen  Post-op Assessment: Report given to PACU RN and Post -op Vital signs reviewed and stable  Post vital signs: Reviewed and stable  Complications: No apparent anesthesia complications

## 2012-08-08 NOTE — Progress Notes (Signed)
[facility] has been notified for preoperative clearance

## 2012-08-08 NOTE — Anesthesia Preprocedure Evaluation (Addendum)
Anesthesia Evaluation  Patient identified by MRN, date of birth, ID band Patient awake    Reviewed: Allergy & Precautions, H&P , NPO status , Patient's Chart, lab work & pertinent test results, reviewed documented beta blocker date and time   Airway Mallampati: I TM Distance: >3 FB Neck ROM: full    Dental  (+) Teeth Intact and Dental Advisory Given   Pulmonary shortness of breath,          Cardiovascular Exercise Tolerance: Poor hypertension, + CAD, + Past MI and + CABG + dysrhythmias Atrial Fibrillation + pacemaker Rhythm:regular Rate:Normal     Neuro/Psych    GI/Hepatic   Endo/Other    Renal/GU CRFRenal disease     Musculoskeletal   Abdominal   Peds  Hematology   Anesthesia Other Findings   Reproductive/Obstetrics                          Anesthesia Physical Anesthesia Plan  ASA: III  Anesthesia Plan: General   Post-op Pain Management:    Induction: Intravenous  Airway Management Planned: Oral ETT  Additional Equipment:   Intra-op Plan:   Post-operative Plan: Possible Post-op intubation/ventilation and Extubation in OR  Informed Consent: I have reviewed the patients History and Physical, chart, labs and discussed the procedure including the risks, benefits and alternatives for the proposed anesthesia with the patient or authorized representative who has indicated his/her understanding and acceptance.   Dental advisory given  Plan Discussed with: CRNA, Anesthesiologist and Surgeon  Anesthesia Plan Comments:        Anesthesia Quick Evaluation

## 2012-08-08 NOTE — Progress Notes (Signed)
[facility] PROGRESS NOTE  Niger M Gear [id number] DOB: 1922/07/12 DOA: 08/07/2012 PCP: Purvis Kilts, MD  Assessment/Plan: Active Problems:  Radial head fracture, closed  Hypertension  Hyperlipidemia  CAD (coronary artery disease)  Radial fracture/impacted hip fracture, patient to undergo surgery today, given extensive history including underlying coronary artery disease, status post CABG, pacemaker placement, cardiology clearance will be obtained, she may need pacemaker interrogation, [facility] have been notified  Paroxysmal A. fib not resolved, will remain high risk for postoperative atrial fibrillation, continue perioperative beta blocker  Hypertension holding ARB and hydrochlorothiazide, due to mild renal insufficiency  Coronary artery disease we'll cycle cardiac enzymes   Code Status: full Family Communication: family updated about patient's clinical progress Disposition Plan:  As above    Brief narrative: 78 year old female who lives alone, who had a mechanical fall while going up the steps at her front of her home. She states she did not experience any loss of consciousness. She denies any chest pain, palpitations. At that time she denied lightheadedness however, her daughter states her blood pressures been running in the low 100s recently and the patient is also been having some dizziness. The patient is also been having some inner ear issues and has been on Antivert as needed.  The patient's last major surgery with a CABG 4 years ago along with a postoperative pacemaker. She states she does not get chest pains, no history of CHF. She does have some chronic shortness of breath for years which remains unchanged.  Enroute in the ambulance she did have a episode off A. fib with RVR heart rate to 120. She spontaneously reverted to sinus rhythm. Patient transferred from [facility]   Consultants:  Orthopedics  Cardiology    HPI/Subjective: No complaints  Objective: Filed Vitals:   08/07/12 1614 08/07/12 1836 08/07/12 2300 08/08/12 0735  BP: 114/51 118/53 120/52 104/39  Pulse: 73 69 69 76  Temp:  97.9 F (36.6 C) 97.5 F (36.4 C) 98.6 F (37 C)  TempSrc:  Oral Oral Oral  Resp: 18  16 18   Weight:      SpO2: 96% 100% 100% 99%   No intake or output data in the 24 hours ending 08/08/12 0926  Exam:  General: Alert and oriented times three, well developed and nourished, no acute distress  Eyes: PERRLA, pink conjunctiva, no scleral icterus  ENT: Moist oral mucosa, neck supple, no thyromegaly  Lungs: clear to ascultation, no wheeze, no crackles, no use of accessory muscles  Cardiovascular: regular rate and rhythm, no regurgitation, no gallops, no murmurs. No carotid bruits, no JVD  Abdomen: soft, positive BS, non-tender, non-distended, no organomegaly, not an acute abdomen  GU: not examined  Neuro: CN II - XII grossly intact, sensation intact  Musculoskeletal: strength 5/5 all extremities, no clubbing, cyanosis or edema, left upper extremity in cast  Skin: no rash, no subcutaneous crepitation, no decubitus  Psych: appropriate patient      Data Reviewed: Basic Metabolic Panel:  Lab [id number] 0600 08/07/12 1141  NA 134* 136  K 4.3 3.8  CL 99 98  CO2 25 29  GLUCOSE 96 102*  BUN 23 24*  CREATININE 1.32* 1.45*  CALCIUM 9.6 10.2  MG -- --  PHOS -- --    Liver Function Tests:  Lab 08/07/12 1141  AST 33  ALT 21  ALKPHOS 87  BILITOT 0.3  PROT 6.9  ALBUMIN 3.7   No results found for this basename: LIPASE:5,AMYLASE:5 in the last  168 hours No results found for this basename: AMMONIA:5 in the last 168 hours  CBC:  Lab 08/08/12 0600 08/07/12 1141  WBC 7.0 5.9  NEUTROABS -- 4.2  HGB 11.5* 13.7  HCT 34.3* 40.2  MCV 89.1 88.7  PLT 166 182    Cardiac Enzymes: No results found for this basename:  CKTOTAL:5,CKMB:5,CKMBINDEX:5,TROPONINI:5 in the last 168 hours BNP (last 3 results) No results found for this basename: PROBNP:3 in the last 8760 hours   CBG: No results found for this basename: GLUCAP:5 in the last 168 hours  Recent Results (from the past 240 hour(s))  SURGICAL PCR SCREEN     Status: Normal   Collection Time   08/08/12  5:32 AM      Component Value Range Status Comment   MRSA, PCR NEGATIVE  NEGATIVE Final    Staphylococcus aureus NEGATIVE  NEGATIVE Final      Studies: Dg Chest 1 View  08/07/2012  *RADIOLOGY REPORT*  Clinical Data: Fall  CHEST - 1 VIEW  Comparison: 03/25/2012  Findings: Left subclavian sequential transvenous pacemaker leads project at right atrium and right ventricle. Upper normal heart size post CABG. Calcified tortuous aorta. Pulmonary vascularity normal. Lungs grossly clear. No pleural effusion or pneumothorax. Minimal biapical scarring. Diffuse osseous demineralization.  IMPRESSION: Post CABG and pacemaker. No acute abnormalities.   Original Report Authenticated By: Burnetta Sabin, M.D.    Dg Wrist Complete Left  08/07/2012  *RADIOLOGY REPORT*  Clinical Data: Left wrist pain and swelling post fall  LEFT WRIST - COMPLETE 3+ VIEW  Comparison: 03/07/2005  Findings: Minimally displaced ulnar styloid fracture. Comminuted distal left radial metaphyseal fracture with intra- articular extension at the radiocarpal joint. Question extension into the distal radioulnar joint as well. Dorsal tilt of the distal radial articular surface. Overlying soft tissue swelling. Minimal degenerative changes at first [facility] joint. No additional acute fracture or dislocation identified. Osseous demineralization.  IMPRESSION: Ulnar styloid fracture. Angulated mildly displaced intra-articular distal left radial metaphyseal fracture. Osseous demineralization.   Original Report Authenticated By: Burnetta Sabin, M.D.    Dg Hip Complete Left  08/07/2012  *RADIOLOGY REPORT*  Clinical Data:  Left hip pain post fall  LEFT HIP - COMPLETE 2+ VIEW  Comparison: None  Findings: Minimally impacted left femoral neck fracture. Osseous mineralization appears diffusely decreased. Left hip joint space preserved. No additional fracture or dislocation identified. Symmetric SI joints. Pelvis appears intact.  IMPRESSION: Minimally impacted left femoral neck fracture.   Original Report Authenticated By: Burnetta Sabin, M.D.    Ct Head Wo Contrast  08/07/2012  *RADIOLOGY REPORT*  Clinical Data:  Fall.  CT HEAD WITHOUT CONTRAST CT CERVICAL SPINE WITHOUT CONTRAST  Technique:  Multidetector CT imaging of the head and cervical spine was performed following the standard protocol without intravenous contrast.  Multiplanar CT image reconstructions of the cervical spine were also generated.  Comparison:   None  CT HEAD  Findings: No skull fracture or intracranial hemorrhage.  No CT evidence of large acute infarct.  Mild small vessel disease type changes.  No hydrocephalus.  No intracranial mass lesion detected on this unenhanced exam.  Vascular calcifications.  IMPRESSION: No skull fracture or intracranial hemorrhage.  CT CERVICAL SPINE  Findings: No cervical spine fracture.  Dextroscoliosis and degenerative changes.  Spinal stenosis most notable C4-5.  Biapical lung scarring.  Carotid calcifications.  IMPRESSION: No cervical spine fracture.  Cervical spondylotic changes with spinal stenosis most notable C4- 5.   Original Report Authenticated By: Doran Heater  Jeannine Kitten, M.D.    Ct Cervical Spine Wo Contrast  08/07/2012  *RADIOLOGY REPORT*  Clinical Data:  Fall.  CT HEAD WITHOUT CONTRAST CT CERVICAL SPINE WITHOUT CONTRAST  Technique:  Multidetector CT imaging of the head and cervical spine was performed following the standard protocol without intravenous contrast.  Multiplanar CT image reconstructions of the cervical spine were also generated.  Comparison:   None  CT HEAD  Findings: No skull fracture or intracranial hemorrhage.  No  CT evidence of large acute infarct.  Mild small vessel disease type changes.  No hydrocephalus.  No intracranial mass lesion detected on this unenhanced exam.  Vascular calcifications.  IMPRESSION: No skull fracture or intracranial hemorrhage.  CT CERVICAL SPINE  Findings: No cervical spine fracture.  Dextroscoliosis and degenerative changes.  Spinal stenosis most notable C4-5.  Biapical lung scarring.  Carotid calcifications.  IMPRESSION: No cervical spine fracture.  Cervical spondylotic changes with spinal stenosis most notable C4- 5.   Original Report Authenticated By: Doug Sou, M.D.     Scheduled Meds:   . chlorhexidine  60 mL Topical Once  . enoxaparin (LOVENOX) injection  30 mg Subcutaneous Daily  . HYDROmorphone  1 mg Intravenous Once  . metoprolol succinate  50 mg Oral Daily  . morphine  4 mg Intravenous Once  . morphine  4 mg Intravenous Once  . ondansetron  4 mg Intravenous Once  . ondansetron  4 mg Intravenous Once  . ondansetron  4 mg Intravenous Once  . ondansetron  4 mg Intravenous Once  . pantoprazole  40 mg Oral Q1200  . sodium chloride  3 mL Intravenous Q12H   Continuous Infusions:   . sodium chloride 100 mL/hr at 08/08/12 0550  . sodium chloride    . DISCONTD: 0.45 % NaCl with KCl 20 mEq / L 100 mL/hr at 08/07/12 1958    Active Problems:  Radial head fracture, closed  Hypertension  Hyperlipidemia  CAD (coronary artery disease)    Time spent: 40 minutes   [facility] Pager [phone]. If 8PM-8AM, please contact night-coverage at [email or web address], password [facility] 08/08/2012, 9:26 AM  LOS: 1 day

## 2012-08-08 NOTE — Consult Note (Signed)
Reason for Consult:left distal radius fracture Referring Physician:steve lucey  Amy [name] is an 78 y.o. female.  HPI: s/p fall with displaced left distal radius fracture  Past Medical History  Diagnosis Date  . Hypertension   . Hypercholesteremia   . Myocardial infarction     Inferior STEMI 07/2008 - PCI of prox RCA followed byu CABG x 3 in 9/'09  . Pneumonia   . Anxiety   . CAD in native artery     s/p CABG x 3; Last Myoview in 07/2009 - non-ischemic; Echo 03/2012  Aortic Sclerosis with normal EF.    Past Surgical History  Procedure Date  . Coronary artery bypass graft 2009    LIMA-LAD, SVG-RI, SVG-stented RCA  . Total hip arthroplasty   . Pacemaker insertion     Family History  Problem Relation Age of Onset  . Hypertension Mother   . Diabetes Mother   . Cancer Mother   . Hypertension Father   . Stroke Father     Social History:  reports that she has never smoked. She has never used smokeless tobacco. She reports that she does not drink alcohol or use illicit drugs.  Allergies:  Allergies  Allergen Reactions  . Hydrocodone-Acetaminophen Itching    Medications:  Prior to Admission:  Prescriptions prior to admission  Medication Sig Dispense Refill  . acetaminophen (TYLENOL) 500 MG tablet Take 1,000 mg by mouth every 6 (six) hours as needed. Pain      . aspirin 81 MG tablet Take 81 mg by mouth 2 (two) times daily.        . ciprofloxacin (CIPRO) 500 MG tablet Take 500 mg by mouth 2 (two) times daily.      . fish oil-omega-3 fatty acids 1000 MG capsule Take 4 g by mouth daily.      . hydrochlorothiazide (HYDRODIURIL) 25 MG tablet Take 25 mg by mouth daily.      . metoprolol succinate (TOPROL-XL) 25 MG 24 hr tablet Take 25 mg by mouth daily.      . niacin (NIASPAN) 500 MG CR tablet Take 500 mg by mouth at bedtime.        Marland Kitchen omeprazole (PRILOSEC) 20 MG capsule Take 20 mg by mouth daily.        . valsartan (DIOVAN) 160 MG tablet Take 160 mg by mouth daily.           Results for orders placed during the hospital encounter of 08/07/12 (from the past 48 hour(s))  URINALYSIS, MICROSCOPIC ONLY     Status: Normal   Collection Time   08/07/12 11:24 AM      Component Value Range Comment   Color, Urine YELLOW  YELLOW    APPearance CLEAR  CLEAR    Specific Gravity, Urine 1.020  1.005 - 1.030    pH 7.0  5.0 - 8.0    Glucose, UA NEGATIVE  NEGATIVE mg/dL    Hgb urine dipstick NEGATIVE  NEGATIVE    Bilirubin Urine NEGATIVE  NEGATIVE    Ketones, ur NEGATIVE  NEGATIVE mg/dL    Protein, ur NEGATIVE  NEGATIVE mg/dL    Urobilinogen, UA 0.2  0.0 - 1.0 mg/dL    Nitrite NEGATIVE  NEGATIVE    Leukocytes, UA NEGATIVE  NEGATIVE   CBC WITH DIFFERENTIAL     Status: Normal   Collection Time   08/07/12 11:41 AM      Component Value Range Comment   WBC 5.9  4.0 - 10.5 K/uL  RBC 4.53  3.87 - 5.11 MIL/uL    Hemoglobin 13.7  12.0 - 15.0 g/dL    HCT 40.2  36.0 - 46.0 %    MCV 88.7  78.0 - 100.0 fL    MCH 30.2  26.0 - 34.0 pg    MCHC 34.1  30.0 - 36.0 g/dL    RDW 13.0  11.5 - 15.5 %    Platelets 182  150 - 400 K/uL    Neutrophils Relative 72  43 - 77 %    Neutro Abs 4.2  1.7 - 7.7 K/uL    Lymphocytes Relative 21  12 - 46 %    Lymphs Abs 1.2  0.7 - 4.0 K/uL    Monocytes Relative 5  3 - 12 %    Monocytes Absolute 0.3  0.1 - 1.0 K/uL    Eosinophils Relative 1  0 - 5 %    Eosinophils Absolute 0.1  0.0 - 0.7 K/uL    Basophils Relative 1  0 - 1 %    Basophils Absolute 0.1  0.0 - 0.1 K/uL   COMPREHENSIVE METABOLIC PANEL     Status: Abnormal   Collection Time   08/07/12 11:41 AM      Component Value Range Comment   Sodium 136  135 - 145 mEq/L    Potassium 3.8  3.5 - 5.1 mEq/L    Chloride 98  96 - 112 mEq/L    CO2 29  19 - 32 mEq/L    Glucose, Bld 102 (*) 70 - 99 mg/dL    BUN 24 (*) 6 - 23 mg/dL    Creatinine, Ser 1.45 (*) 0.50 - 1.10 mg/dL    Calcium 10.2  8.4 - 10.5 mg/dL    Total Protein 6.9  6.0 - 8.3 g/dL    Albumin 3.7  3.5 - 5.2 g/dL    AST 33  0 - 37 U/L     ALT 21  0 - 35 U/L    Alkaline Phosphatase 87  39 - 117 U/L    Total Bilirubin 0.3  0.3 - 1.2 mg/dL    GFR calc non Af Amer 31 (*) >90 mL/min    GFR calc Af Amer 36 (*) >90 mL/min   APTT     Status: Normal   Collection Time   08/07/12 11:41 AM      Component Value Range Comment   aPTT 26  24 - 37 seconds   PROTIME-INR     Status: Normal   Collection Time   08/07/12 11:41 AM      Component Value Range Comment   Prothrombin Time 12.5  11.6 - 15.2 seconds    INR 0.94  0.00 - 1.49   SAMPLE TO BLOOD BANK     Status: Normal   Collection Time   08/07/12 11:44 AM      Component Value Range Comment   Blood Bank Specimen SAMPLE AVAILABLE FOR TESTING      Sample Expiration 08/10/2012     SURGICAL PCR SCREEN     Status: Normal   Collection Time   08/08/12  5:32 AM      Component Value Range Comment   MRSA, PCR NEGATIVE  NEGATIVE    Staphylococcus aureus NEGATIVE  NEGATIVE   BASIC METABOLIC PANEL     Status: Abnormal   Collection Time   08/08/12  6:00 AM      Component Value Range Comment   Sodium 134 (*) 135 - 145 mEq/L  Potassium 4.3  3.5 - 5.1 mEq/L    Chloride 99  96 - 112 mEq/L    CO2 25  19 - 32 mEq/L    Glucose, Bld 96  70 - 99 mg/dL    BUN 23  6 - 23 mg/dL    Creatinine, Ser 1.32 (*) 0.50 - 1.10 mg/dL    Calcium 9.6  8.4 - 10.5 mg/dL    GFR calc non Af Amer 35 (*) >90 mL/min    GFR calc Af Amer 40 (*) >90 mL/min   CBC     Status: Abnormal   Collection Time   08/08/12  6:00 AM      Component Value Range Comment   WBC 7.0  4.0 - 10.5 K/uL    RBC 3.85 (*) 3.87 - 5.11 MIL/uL    Hemoglobin 11.5 (*) 12.0 - 15.0 g/dL    HCT 34.3 (*) 36.0 - 46.0 %    MCV 89.1  78.0 - 100.0 fL    MCH 29.9  26.0 - 34.0 pg    MCHC 33.5  30.0 - 36.0 g/dL    RDW 13.3  11.5 - 15.5 %    Platelets 166  150 - 400 K/uL     Dg Chest 1 View  08/07/2012  *RADIOLOGY REPORT*  Clinical Data: Fall  CHEST - 1 VIEW  Comparison: 03/25/2012  Findings: Left subclavian sequential transvenous pacemaker leads  project at right atrium and right ventricle. Upper normal heart size post CABG. Calcified tortuous aorta. Pulmonary vascularity normal. Lungs grossly clear. No pleural effusion or pneumothorax. Minimal biapical scarring. Diffuse osseous demineralization.  IMPRESSION: Post CABG and pacemaker. No acute abnormalities.   Original Report Authenticated By: Burnetta Sabin, M.D.    Dg Wrist Complete Left  08/07/2012  *RADIOLOGY REPORT*  Clinical Data: Left wrist pain and swelling post fall  LEFT WRIST - COMPLETE 3+ VIEW  Comparison: 03/07/2005  Findings: Minimally displaced ulnar styloid fracture. Comminuted distal left radial metaphyseal fracture with intra- articular extension at the radiocarpal joint. Question extension into the distal radioulnar joint as well. Dorsal tilt of the distal radial articular surface. Overlying soft tissue swelling. Minimal degenerative changes at first [facility] joint. No additional acute fracture or dislocation identified. Osseous demineralization.  IMPRESSION: Ulnar styloid fracture. Angulated mildly displaced intra-articular distal left radial metaphyseal fracture. Osseous demineralization.   Original Report Authenticated By: Burnetta Sabin, M.D.    Dg Hip Complete Left  08/07/2012  *RADIOLOGY REPORT*  Clinical Data: Left hip pain post fall  LEFT HIP - COMPLETE 2+ VIEW  Comparison: None  Findings: Minimally impacted left femoral neck fracture. Osseous mineralization appears diffusely decreased. Left hip joint space preserved. No additional fracture or dislocation identified. Symmetric SI joints. Pelvis appears intact.  IMPRESSION: Minimally impacted left femoral neck fracture.   Original Report Authenticated By: Burnetta Sabin, M.D.    Ct Head Wo Contrast  08/07/2012  *RADIOLOGY REPORT*  Clinical Data:  Fall.  CT HEAD WITHOUT CONTRAST CT CERVICAL SPINE WITHOUT CONTRAST  Technique:  Multidetector CT imaging of the head and cervical spine was performed following the standard protocol without  intravenous contrast.  Multiplanar CT image reconstructions of the cervical spine were also generated.  Comparison:   None  CT HEAD  Findings: No skull fracture or intracranial hemorrhage.  No CT evidence of large acute infarct.  Mild small vessel disease type changes.  No hydrocephalus.  No intracranial mass lesion detected on this unenhanced exam.  Vascular calcifications.  IMPRESSION: No  skull fracture or intracranial hemorrhage.  CT CERVICAL SPINE  Findings: No cervical spine fracture.  Dextroscoliosis and degenerative changes.  Spinal stenosis most notable C4-5.  Biapical lung scarring.  Carotid calcifications.  IMPRESSION: No cervical spine fracture.  Cervical spondylotic changes with spinal stenosis most notable C4- 5.   Original Report Authenticated By: Doug Sou, M.D.    Ct Cervical Spine Wo Contrast  08/07/2012  *RADIOLOGY REPORT*  Clinical Data:  Fall.  CT HEAD WITHOUT CONTRAST CT CERVICAL SPINE WITHOUT CONTRAST  Technique:  Multidetector CT imaging of the head and cervical spine was performed following the standard protocol without intravenous contrast.  Multiplanar CT image reconstructions of the cervical spine were also generated.  Comparison:   None  CT HEAD  Findings: No skull fracture or intracranial hemorrhage.  No CT evidence of large acute infarct.  Mild small vessel disease type changes.  No hydrocephalus.  No intracranial mass lesion detected on this unenhanced exam.  Vascular calcifications.  IMPRESSION: No skull fracture or intracranial hemorrhage.  CT CERVICAL SPINE  Findings: No cervical spine fracture.  Dextroscoliosis and degenerative changes.  Spinal stenosis most notable C4-5.  Biapical lung scarring.  Carotid calcifications.  IMPRESSION: No cervical spine fracture.  Cervical spondylotic changes with spinal stenosis most notable C4- 5.   Original Report Authenticated By: Doug Sou, M.D.     Review of Systems  All other systems reviewed and are negative.   Blood  pressure 141/60, pulse 69, temperature 98.6 F (37 C), temperature source Oral, resp. rate 18, weight 68.04 kg (150 lb), SpO2 98.00%. Physical Exam  Constitutional: She is oriented to person, place, and time. She appears well-developed and well-nourished.  HENT:  Head: Normocephalic and atraumatic.  Cardiovascular: Normal rate.   Respiratory: Effort normal.  Musculoskeletal:       Left wrist: She exhibits tenderness, bony tenderness and deformity.  Neurological: She is alert and oriented to person, place, and time.  Psychiatric: She has a normal mood and affect. Her behavior is normal. Judgment and thought content normal.    Assessment/Plan: Plan ORIF  Zyen Triggs A 08/08/2012, 1:12 PM

## 2012-08-09 ENCOUNTER — Encounter (HOSPITAL_COMMUNITY): Payer: Self-pay | Admitting: General Practice

## 2012-08-09 ENCOUNTER — Inpatient Hospital Stay (HOSPITAL_COMMUNITY): Payer: Medicare Other

## 2012-08-09 DIAGNOSIS — R1310 Dysphagia, unspecified: Secondary | ICD-10-CM | POA: Diagnosis present

## 2012-08-09 LAB — CBC
MCV: 90 fL (ref 78.0–100.0)
Platelets: 161 10*3/uL (ref 150–400)
RBC: 3.31 MIL/uL — ABNORMAL LOW (ref 3.87–5.11)
WBC: 14.1 10*3/uL — ABNORMAL HIGH (ref 4.0–10.5)

## 2012-08-09 LAB — BASIC METABOLIC PANEL
CO2: 26 mEq/L (ref 19–32)
Calcium: 8.7 mg/dL (ref 8.4–10.5)
Chloride: 99 mEq/L (ref 96–112)
GFR calc Af Amer: 35 mL/min — ABNORMAL LOW (ref >90)
Sodium: 134 mEq/L — ABNORMAL LOW (ref 135–145)

## 2012-08-09 LAB — URINALYSIS, ROUTINE W REFLEX MICROSCOPIC
Bilirubin Urine: NEGATIVE
Nitrite: NEGATIVE
Specific Gravity, Urine: 1.018 (ref 1.005–1.030)
pH: 5 (ref 5.0–8.0)

## 2012-08-09 LAB — URINE MICROSCOPIC-ADD ON

## 2012-08-09 MED ORDER — ENOXAPARIN SODIUM 40 MG/0.4ML ~~LOC~~ SOLN
40.0000 mg | SUBCUTANEOUS | Status: DC
  Administered 2012-08-09 – 2012-08-11 (×3): 40 mg via SUBCUTANEOUS
  Filled 2012-08-09 (×4): qty 0.4

## 2012-08-09 NOTE — Evaluation (Signed)
Occupational Therapy Evaluation Patient Details Name: Amy [name] MRN: [id number] DOB: Jun 01, 1922 Today's Date: 08/09/2012 Time: [id number] OT Time Calculation (min): 35 min  OT Assessment / Plan / Recommendation Clinical Impression  Pt admitted after falling at home and now s/p L ORIF of distal raidal fx and left carpal tunnel release as well as left hip hemi-arthroplasty thus affecting PLOF. Will benefit from acute OT services to address below problem list. Recommend ST SNF before eventual return home.    OT Assessment  Patient needs continued OT Services    Follow Up Recommendations  Skilled nursing facility    Barriers to Discharge      Equipment Recommendations  3 in 1 bedside comode    Recommendations for Other Services    Frequency  Min 2X/week    Precautions / Restrictions Precautions Precautions: Posterior Hip Precaution Comments: Educated pt on 3/3 posterior hip precautions Restrictions Weight Bearing Restrictions: Yes LLE Weight Bearing: Weight bearing as tolerated   Pertinent Vitals/Pain See vitals   ADL  Upper Body Bathing: Simulated;Moderate assistance Where Assessed - Upper Body Bathing: Supported sitting Lower Body Bathing: Simulated;+1 Total assistance Where Assessed - Lower Body Bathing: Supported sitting Upper Body Dressing: Simulated;Moderate assistance Where Assessed - Upper Body Dressing: Supported sitting Lower Body Dressing: Simulated;+1 Total assistance Where Assessed - Lower Body Dressing: Supported sitting Transfers/Ambulation Related to ADLs: Pt unable to tolerate OOB mobility due to pain ADL Comments: Educated pt continue performing LUE ROM and elevation with pillows to assist in preventing edema. Pt significantly limited by pain.  Performed bed mobility and EOB sitting while NWB on LUE (until further clarified).    OT Diagnosis: Generalized weakness;Acute pain  OT Problem List: Decreased activity tolerance;Impaired balance (sitting and/or  standing);Decreased knowledge of use of DME or AE;Decreased knowledge of precautions;Pain;Impaired UE functional use;Impaired sensation;Decreased strength OT Treatment Interventions: Self-care/ADL training;DME and/or AE instruction;Therapeutic activities;Patient/family education;Balance training;Therapeutic exercise   OT Goals Acute Rehab OT Goals OT Goal Formulation: With patient/family Time For Goal Achievement: 08/23/12 Potential to Achieve Goals: Good ADL Goals Pt Will Perform Grooming: with set-up;Sitting, chair;Sitting, edge of bed ADL Goal: Grooming - Progress: Goal set today Pt Will Perform Upper Body Dressing: with set-up;Sitting, chair;Sitting, bed;Unsupported ADL Goal: Upper Body Dressing - Progress: Goal set today Pt Will Perform Lower Body Dressing: with min assist;Sit to stand from chair;Sit to stand from bed;Supported;with adaptive equipment ADL Goal: Lower Body Dressing - Progress: Goal set today Pt Will Transfer to Toilet: with min assist;Ambulation;with DME;Comfort height toilet;Maintaining hip precautions ADL Goal: Toilet Transfer - Progress: Goal set today Pt Will Perform Toileting - Clothing Manipulation: with supervision;Sitting on 3-in-1 or toilet;Standing ADL Goal: Toileting - Clothing Manipulation - Progress: Goal set today Pt Will Perform Toileting - Hygiene: with supervision;Sit to stand from 3-in-1/toilet;Sitting on 3-in-1 or toilet ADL Goal: Toileting - Hygiene - Progress: Goal set today Arm Goals Additional Arm Goal #1: Pt will independently perform Left digit, elbow and shoulder AROM to maintain full ROM. Arm Goal: Additional Goal #1 - Progress: Goal set today Miscellaneous OT Goals Miscellaneous OT Goal #1: Pt will perform bed mobility with min assist in prep for EOB ADLs. OT Goal: Miscellaneous Goal #1 - Progress: Goal set today Miscellaneous OT Goal #2: Pt will independently verbalize and generalize 3/3 posterior hip precautions during all ADLs. OT Goal:  Miscellaneous Goal #2 - Progress: Goal set today  Visit Information  Last OT Received On: 08/09/12 Assistance Needed: +2    Subjective Data  Prior Functioning     Home Living Lives With: Alone Available Help at Discharge: Family;Available PRN/intermittently Type of Home: House Home Access: Stairs to enter [employer] of Steps: 1 Entrance Stairs-Rails: Right Bathroom Shower/Tub: Walk-in shower;Curtain Bathroom Toilet: Handicapped height Bathroom Accessibility: Yes How Accessible: Accessible via walker Home Adaptive Equipment: Shower chair without back;Walker - four wheeled;Quad cane;Straight cane;Bedside commode/3-in-1;Hand-held shower hose;Reacher (life alert) Prior Function Level of Independence: Independent Able to Take Stairs?: Yes Driving: Yes Communication Communication: No difficulties         Vision/Perception     Cognition  Overall Cognitive Status: Appears within functional limits for tasks assessed/performed Arousal/Alertness: Lethargic Orientation Level: Appears intact for tasks assessed Behavior During Session: [facility] for tasks performed    Extremity/Trunk Assessment Right Upper Extremity Assessment RUE ROM/Strength/Tone: Within functional levels Left Upper Extremity Assessment LUE ROM/Strength/Tone: Deficits;Unable to fully assess;Due to pain;Due to precautions LUE ROM/Strength/Tone Deficits: ROM WNL at digits, elbow, and shoulder.   LUE Sensation: Deficits LUE Sensation Deficits: "fingers feel heavy" Right Lower Extremity Assessment RLE ROM/Strength/Tone: Unable to fully assess;Due to pain;Deficits     Mobility Bed Mobility Bed Mobility: Supine to Sit;Sitting - Scoot to Edge of Bed;Sit to Supine Supine to Sit: 1: +2 Total assist;HOB elevated Supine to Sit: Patient Percentage: 10% Sitting - Scoot to Edge of Bed: 1: +2 Total assist Sitting - Scoot to Edge of Bed: Patient Percentage: 10% Sit to Supine: 1: +2 Total assist;HOB  flat Sit to Supine: Patient Percentage: 10% Details for Bed Mobility Assistance: Pt attempting to assist with RUE. Max cueing throughout to maintain hip precautions.  +2 assist to support trunk and bil LE's with use of draw pad.  Transfers Transfers: Not assessed     Shoulder Instructions     Exercise     Balance Balance Balance Assessed: Yes Static Sitting Balance Static Sitting - Balance Support: Right upper extremity supported;Feet supported Static Sitting - Level of Assistance: 3: Mod assist Static Sitting - Comment/# of Minutes: ~5 minutes sitting EOB    End of Session OT - End of Session Activity Tolerance: Patient limited by pain Patient left: in bed;with call bell/phone within reach;with family/visitor present Nurse Communication: Mobility status;Patient requests pain meds (pain in rt foot; RN ordered x-ray)  GO   08/09/2012 Darrol Jump OTR/L Pager [phone] Office [phone]   Darrol Jump 08/09/2012, 6:27 PM

## 2012-08-09 NOTE — Op Note (Signed)
NAME:  [name], Amy                 ACCOUNT NO.:  [id number]  MEDICAL RECORD NO.:  [id number]  LOCATION:  5N11C                        FACILITY:  [place]  PHYSICIAN:  Rodman Key A. Naomia Lenderman, M.D.DATE OF BIRTH:  1922-08-22  DATE OF PROCEDURE:  08/08/2012 DATE OF DISCHARGE:  08/08/2012                              OPERATIVE REPORT   PREOPERATIVE DIAGNOSIS:  Displaced intra-articular fracture, left distal radius with carpal tunnel syndrome.  POSTOPERATIVE DIAGNOSIS:  Displaced intra-articular fracture, left distal radius with carpal tunnel syndrome.  PROCEDURE:  Open reduction and internal fixation, distal radius fracture left distal radius, left carpal tunnel release.  SURGEON:  Sheral Apley. Burney Gauze, M.D.  ASSISTANT:  None.  ANESTHESIA:  General.  COMPLICATIONS:  No complications.  DRAINS:  No drains.  The patient was taken to the operating suite.  After induction of adequate general anesthesia, left upper extremity was prepped and draped in sterile fashion.  An Esmarch was used to exsanguinate the limb. Tourniquet was inflated to 250 mmHg.  At this point in time, a volar incision was made over the distal forearm and wrist area, left side. Skin was incised over the FCR tendon.  The sheath overlying the FCR was incised.  The FCR was retracted to the midline, the radial artery to the lateral side.  Dissection was carried down to the level of pronator quadratus, it was subperiosteally stripped off the distal radius revealing intra-articular fracture with multiple fragments.  The brachioradialis was carefully elevated and released off the distal fragment.  Reduction, flexion, ulnar deviation, and slight traction was used to reduce the fracture.  Intraoperative fluoroscopy revealed adequate reduction in AP, lateral, and oblique view.  Standard DVR plate was fastened to the volar aspect distal radius, this was followed by drill hole with a cortical screw.  Under direct fluoroscopic  guidance, appropriate plate position was determined.  It was then fixed with 2 more cortical screws proximally followed by smooth pegs distally. Intraoperative fluoroscopy revealed adequate reduction in the AP, lateral, and oblique view.  The median nerve was identified in the carpal canal.  The transverse carpal ligament was divided from proximal to distal with care to protect the median nerve and the palmar cutaneous branch of the median nerve.  Once this was done, the wound was irrigated.  Hemostasis was archived with electrocautery.  It was loosely closed in layers of 2-0 undyed Vicryl and a 4-0 Vicryl Rapide subcuticular stitch on the skin.  Steri-Strips, 4 x 4s, fluffs, and a compressive dressing was applied as well as a volar splint.  The patient then had a hemiarthroplasty performed by Dr. Irving Shows for a left hip fracture.  The patient tolerated the procedure well and went to the recovery room in a stable fashion.     Sheral Apley Burney Gauze, M.D.     MAW/MEDQ  D:  08/08/2012  T:  08/09/2012  Job:  [id number]

## 2012-08-09 NOTE — Progress Notes (Signed)
Subjective:  "I can't swallow" she did not have this pre op but she did have GERD Sxs pre op.  Objective:  Vital Signs in the last 24 hours: Temp:  [97.5 F (36.4 C)-98.2 F (36.8 C)] 97.5 F (36.4 C) (10/10 0445) Pulse Rate:  [69-80] 69  (10/10 0445) Resp:  [10-19] 19  (10/10 0445) BP: (86-141)/(31-60) 92/40 mmHg (10/10 0454) SpO2:  [93 %-100 %] 97 % (10/10 0445) FiO2 (%):  [3 %] 3 % (10/10 0445)  Intake/Output from previous day:  Intake/Output Summary (Last 24 hours) at 08/09/12 0835 Last data filed at 08/09/12 0700  Gross per 24 hour  Intake   2403 ml  Output    400 ml  Net   2003 ml    Physical Exam: General appearance: alert, cooperative and no distress Lungs: few crackles Lt base Heart: regular rate and rhythm and soft systolic murmur Aov   Rate: 75  Rhythm: paced  Lab Results:  Basename 08/09/12 0520 08/08/12 0600  WBC 14.1* 7.0  HGB 9.9* 11.5*  PLT 161 166    Basename 08/09/12 0520 08/08/12 0600  NA 134* 134*  K 4.8 4.3  CL 99 99  CO2 26 25  GLUCOSE 140* 96  BUN 24* 23  CREATININE 1.50* 1.32*   No results found for this basename: TROPONINI:2,CK,MB:2 in the last 72 hours Hepatic Function Panel  Basename 08/07/12 1141  PROT 6.9  ALBUMIN 3.7  AST 33  ALT 21  ALKPHOS 87  BILITOT 0.3  BILIDIR --  IBILI --   No results found for this basename: CHOL in the last 72 hours  Basename 08/07/12 1141  INR 0.94    Imaging: Imaging results have been reviewed  Cardiac Studies:  Assessment/Plan:   Principal Problem:  *Pre-operative clearance Active Problems:  Fracture of left hip after fall at home.  Radial head fracture, closed, Lt  CAD, CABG X 3 10/09. Myoview low risk 10/10. EF >55% 2D 6/13.  Hx of dizziness, ? orthostatic in setting of UTI ? mild dehydration  Chronic renal insufficiency, stage III (moderate), SCr 1.4 Sept 2013  PAF/SSS  Dysphagia, new post op  Hypertension  Hyperlipidemia, statin intol.  Aortic valve sclerosis, no  stenosis  Carotid bruit present bilat, dopplers OK 6/13  UTI, just finnished 10 days of Cipro  Pacemaker March 2010 (MDT)  DVT after previous hip surgery   Plan- I have made her NPO for now, will get bedside swallowing eval. From a cardiac standpoint she is stable. She has a paced rhythm and really doesn't need telemetry. We did hold some of her antihypertensives and these may need to be adjusted before discharge. MD to see.   Kerin Ransom PA-C 08/09/2012, 8:35 AM

## 2012-08-09 NOTE — Progress Notes (Signed)
08/09/2012 12:23 AM Pt with c/o of very painful, difficult to swallow.  Pt states that she was having a little difficulty swallowing prior to surgery.  Pt states that is was painful before and felt like her food/drink would get "hung up", pt pointing to mid-chest.  Pt states that now it is very painful making swallowing difficult. Hively, Luciano Cutter, RN

## 2012-08-09 NOTE — Progress Notes (Signed)
Pt. Seen and examined. Agree with the NP/PA-C note as written.  Tolerated surgery well without apparent cardiac complications. In sinus rhythm. No active cardiac issues. Agree with swallowing evaluation, however, dysphagia (or odynophagia) may have been due to intubation.  Will sign off and arrange follow-up with Dr. Rollene Fare. Call with questions.  Pixie Casino, MD, [facility] Attending Cardiologist [facility]

## 2012-08-09 NOTE — Progress Notes (Signed)
[facility] PROGRESS NOTE  Amy [name] [id number] DOB: 1922-07-06 DOA: 08/07/2012 PCP: Amy Kilts, MD  Assessment/Plan: Principal Problem:  *Pre-operative clearance Active Problems:  Radial head fracture, closed, Lt  Hypertension  Hyperlipidemia, statin intol.  CAD, CABG X 3 10/09. Myoview low risk 10/10. EF >55% 2D 6/13.  Fracture of left hip after fall at home.  Aortic valve sclerosis, no stenosis  Carotid bruit present bilat, dopplers OK 6/13  UTI, just finnished 10 days of Cipro  Hx of dizziness, ? orthostatic in setting of UTI ? mild dehydration  Chronic renal insufficiency, stage III (moderate), SCr 1.4 Sept 2013  PAF/SSS  Pacemaker March 2010 (MDT)  DVT after previous hip surgery  Dysphagia, new post op    Radial fracture/impacted hip fracture,, given extensive history including underlying coronary artery disease, status post CABG, pacemaker placement, CAD, CABG X 3 10/09. Myoview low risk 10/10. EF >55% 2D 6/13 cardiology clearance obtained, status post Open reduction and internal fixation, distal radius fracture  left distal radius, left carpal tunnel release.   Dysphagia, will obtain a speech therapy consultation, and started postoperatively, continue PPI because of history of GERD  Paroxysmal A. fib now resolved, will remain high risk for postoperative atrial fibrillation, continue perioperative beta blocker    Hypertension holding ARB and hydrochlorothiazide, due to mild renal insufficiency , postoperative hypertension  Recent UTI status post treatment with ciprofloxacin   Coronary artery disease we'll cycle cardiac enzymes, history of Inferior STEMI 07/2008 - PCI of prox RCA followed byu CABG x 3 in 9/'09 , continue aspirin  Acute blood loss anemia hemoglobin has dropped to 9.9, will transfuse if dropped below 7 without any active bleeding  Dyslipidemia on niacin and fish which will be continued  Chronic kidney disease, stage III,  creatinine at baseline  Code Status: full Family Communication: family updated about patient's clinical progress Disposition Plan:  As above    Brief narrative:  78 year old female who lives alone, who had a mechanical fall while going up the steps at her front of her home. She states she did not experience any loss of consciousness. She denies any chest pain, palpitations. At that time she denied lightheadedness however, her daughter states her blood pressures been running in the low 100s recently and the patient is also been having some dizziness. The patient is also been having some inner ear issues and has been on Antivert as needed.  The patient's last major surgery with a CABG 4 years ago along with a postoperative pacemaker. She states she does not get chest pains, no history of CHF. She does have some chronic shortness of breath for years which remains unchanged.  Enroute in the ambulance she did have a episode off A. fib with RVR heart rate to 120. She spontaneously reverted to sinus rhythm. Patient transferred from [place]:  Orthopedics  Cardiology HPI/Subjective:  Difficulty swallowing  Objective: Filed Vitals:   08/08/12 1750 08/08/12 1805 08/09/12 0445 08/09/12 0454  BP: 122/48 119/53 86/31 92/40   Pulse: 70 78 69   Temp:   97.5 F (36.4 C)   TempSrc:   Oral   Resp: 15 17 19    Weight:      SpO2: 98% 100% 97%     Intake/Output Summary (Last 24 hours) at 08/09/12 0856 Last data filed at 08/09/12 0700  Gross per 24 hour  Intake   2403 ml  Output    400 ml  Net   2003 ml  Exam:  HENT:  Head: Atraumatic.  Nose: Nose normal.  Mouth/Throat: Oropharynx is clear and moist.  Eyes: Conjunctivae are normal. Pupils are equal, round, and reactive to light. No scleral icterus.  Neck: Neck supple. No tracheal deviation present.  Cardiovascular: Normal rate, regular rhythm, normal heart sounds and intact distal pulses.  Pulmonary/Chest: Effort normal and  breath sounds normal. No respiratory distress.  Abdominal: Soft. Normal appearance and bowel sounds are normal. She exhibits no distension. There is no tenderness.  Left wrist: She exhibits tenderness, bony tenderness and deformity.  Neurological: She is alert and oriented to person, place, and time.  Psychiatric: She has a normal mood and affect. Her behavior is normal. Judgment and thought content normal    Data Reviewed: Basic Metabolic Panel:  Lab [id number] 0520 08/08/12 0600 08/07/12 1141  NA 134* 134* 136  K 4.8 4.3 3.8  CL 99 99 98  CO2 26 25 29   GLUCOSE 140* 96 102*  BUN 24* 23 24*  CREATININE 1.50* 1.32* 1.45*  CALCIUM 8.7 9.6 10.2  MG -- -- --  PHOS -- -- --    Liver Function Tests:  Lab 08/07/12 1141  AST 33  ALT 21  ALKPHOS 87  BILITOT 0.3  PROT 6.9  ALBUMIN 3.7   No results found for this basename: LIPASE:5,AMYLASE:5 in the last 168 hours No results found for this basename: AMMONIA:5 in the last 168 hours  CBC:  Lab 08/09/12 0520 08/08/12 0600 08/07/12 1141  WBC 14.1* 7.0 5.9  NEUTROABS -- -- 4.2  HGB 9.9* 11.5* 13.7  HCT 29.8* 34.3* 40.2  MCV 90.0 89.1 88.7  PLT 161 166 182    Cardiac Enzymes: No results found for this basename: CKTOTAL:5,CKMB:5,CKMBINDEX:5,TROPONINI:5 in the last 168 hours BNP (last 3 results) No results found for this basename: PROBNP:3 in the last 8760 hours   CBG: No results found for this basename: GLUCAP:5 in the last 168 hours  Recent Results (from the past 240 hour(s))  URINE CULTURE     Status: Normal   Collection Time   08/07/12 11:24 AM      Component Value Range Status Comment   Specimen Description URINE, CATHETERIZED   Final    Special Requests NONE   Final    Culture  Setup Time 08/07/2012 16:50   Final    Colony Count NO GROWTH   Final    Culture NO GROWTH   Final    Report Status 08/08/2012 FINAL   Final   SURGICAL PCR SCREEN     Status: Normal   Collection Time   08/08/12  5:32 AM      Component Value  Range Status Comment   MRSA, PCR NEGATIVE  NEGATIVE Final    Staphylococcus aureus NEGATIVE  NEGATIVE Final      Studies: Dg Chest 1 View  08/07/2012  *RADIOLOGY REPORT*  Clinical Data: Fall  CHEST - 1 VIEW  Comparison: 03/25/2012  Findings: Left subclavian sequential transvenous pacemaker leads project at right atrium and right ventricle. Upper normal heart size post CABG. Calcified tortuous aorta. Pulmonary vascularity normal. Lungs grossly clear. No pleural effusion or pneumothorax. Minimal biapical scarring. Diffuse osseous demineralization.  IMPRESSION: Post CABG and pacemaker. No acute abnormalities.   Original Report Authenticated By: Burnetta Sabin, M.D.    Dg Wrist Complete Left  08/07/2012  *RADIOLOGY REPORT*  Clinical Data: Left wrist pain and swelling post fall  LEFT WRIST - COMPLETE 3+ VIEW  Comparison: 03/07/2005  Findings: Minimally displaced ulnar  styloid fracture. Comminuted distal left radial metaphyseal fracture with intra- articular extension at the radiocarpal joint. Question extension into the distal radioulnar joint as well. Dorsal tilt of the distal radial articular surface. Overlying soft tissue swelling. Minimal degenerative changes at first [facility] joint. No additional acute fracture or dislocation identified. Osseous demineralization.  IMPRESSION: Ulnar styloid fracture. Angulated mildly displaced intra-articular distal left radial metaphyseal fracture. Osseous demineralization.   Original Report Authenticated By: Burnetta Sabin, M.D.    Dg Hip Complete Left  08/07/2012  *RADIOLOGY REPORT*  Clinical Data: Left hip pain post fall  LEFT HIP - COMPLETE 2+ VIEW  Comparison: None  Findings: Minimally impacted left femoral neck fracture. Osseous mineralization appears diffusely decreased. Left hip joint space preserved. No additional fracture or dislocation identified. Symmetric SI joints. Pelvis appears intact.  IMPRESSION: Minimally impacted left femoral neck fracture.   Original  Report Authenticated By: Burnetta Sabin, M.D.    Ct Head Wo Contrast  08/07/2012  *RADIOLOGY REPORT*  Clinical Data:  Fall.  CT HEAD WITHOUT CONTRAST CT CERVICAL SPINE WITHOUT CONTRAST  Technique:  Multidetector CT imaging of the head and cervical spine was performed following the standard protocol without intravenous contrast.  Multiplanar CT image reconstructions of the cervical spine were also generated.  Comparison:   None  CT HEAD  Findings: No skull fracture or intracranial hemorrhage.  No CT evidence of large acute infarct.  Mild small vessel disease type changes.  No hydrocephalus.  No intracranial mass lesion detected on this unenhanced exam.  Vascular calcifications.  IMPRESSION: No skull fracture or intracranial hemorrhage.  CT CERVICAL SPINE  Findings: No cervical spine fracture.  Dextroscoliosis and degenerative changes.  Spinal stenosis most notable C4-5.  Biapical lung scarring.  Carotid calcifications.  IMPRESSION: No cervical spine fracture.  Cervical spondylotic changes with spinal stenosis most notable C4- 5.   Original Report Authenticated By: Doug Sou, M.D.    Ct Cervical Spine Wo Contrast  08/07/2012  *RADIOLOGY REPORT*  Clinical Data:  Fall.  CT HEAD WITHOUT CONTRAST CT CERVICAL SPINE WITHOUT CONTRAST  Technique:  Multidetector CT imaging of the head and cervical spine was performed following the standard protocol without intravenous contrast.  Multiplanar CT image reconstructions of the cervical spine were also generated.  Comparison:   None  CT HEAD  Findings: No skull fracture or intracranial hemorrhage.  No CT evidence of large acute infarct.  Mild small vessel disease type changes.  No hydrocephalus.  No intracranial mass lesion detected on this unenhanced exam.  Vascular calcifications.  IMPRESSION: No skull fracture or intracranial hemorrhage.  CT CERVICAL SPINE  Findings: No cervical spine fracture.  Dextroscoliosis and degenerative changes.  Spinal stenosis most notable  C4-5.  Biapical lung scarring.  Carotid calcifications.  IMPRESSION: No cervical spine fracture.  Cervical spondylotic changes with spinal stenosis most notable C4- 5.   Original Report Authenticated By: Doug Sou, M.D.    Dg Chest Port 1 View  08/08/2012  *RADIOLOGY REPORT*  Clinical Data: Preoperative study.  Shortness of breath.  PORTABLE CHEST - 1 VIEW  Comparison: Chest x-ray 08/07/2012.  Findings: The lung volumes are normal.  No consolidative airspace disease.  No definite pleural effusions.  Bilateral apical pleuroparenchymal thickening, similar to prior examinations, most compatible with scarring.  Prominence of the central pulmonary arteries could suggest pulmonary arterial hypertension. Mild pulmonary venous congestion without frank pulmonary edema.  Heart size is mildly enlarged (unchanged). The patient is rotated to the right on today's exam,  resulting in distortion of the mediastinal contours and reduced diagnostic sensitivity and specificity for mediastinal pathology.  Atherosclerotic calcifications are noted within the arch of the aorta.  Left-sided pacemaker device in place with lead tips projecting over the expected location of the right atrium and right ventricular apex.  IMPRESSION: 1.  Cardiomegaly with mild pulmonary venous congestion, but no frank pulmonary edema. 2.  In addition, there is dilatation of the central pulmonary arteries, which could suggest pulmonary arterial hypertension. 3.  Atherosclerosis. 4.  Postoperative changes and support apparatus, as above.   Original Report Authenticated By: Etheleen Mayhew, M.D.    Dg Shoulder Left  08/08/2012  *RADIOLOGY REPORT*  Clinical Data: Pain.  Recent injury.  LEFT SHOULDER - 2+ VIEW  Comparison: None.  Findings: No evidence of fracture or dislocation.  No significant degenerative change of the glenohumeral joint.  Ordinary osteoarthritis of the AC joint.  Pacemaker overlies the region. There are old appearing left rib  fractures, not primarily evaluated.  IMPRESSION: No evidence of fracture or dislocation.  Osteoarthritis of the [facility] joint.   Original Report Authenticated By: Jules Schick, M.D.     Scheduled Meds:   . enoxaparin (LOVENOX) injection  30 mg Subcutaneous Daily  . metoprolol succinate  50 mg Oral Daily  . pantoprazole  40 mg Oral Q1200  . sodium chloride  3 mL Intravenous Q12H  . DISCONTD: chlorhexidine  60 mL Topical Once  . DISCONTD: enoxaparin  40 mg Subcutaneous Q24H   Continuous Infusions:   . sodium chloride 100 mL/hr at 08/08/12 2347  . DISCONTD: sodium chloride      Principal Problem:  *Pre-operative clearance Active Problems:  Radial head fracture, closed, Lt  Hypertension  Hyperlipidemia, statin intol.  CAD, CABG X 3 10/09. Myoview low risk 10/10. EF >55% 2D 6/13.  Fracture of left hip after fall at home.  Aortic valve sclerosis, no stenosis  Carotid bruit present bilat, dopplers OK 6/13  UTI, just finnished 10 days of Cipro  Hx of dizziness, ? orthostatic in setting of UTI ? mild dehydration  Chronic renal insufficiency, stage III (moderate), SCr 1.4 Sept 2013  PAF/SSS  Pacemaker March 2010 (MDT)  DVT after previous hip surgery  Dysphagia, new post op    Time spent: 40 minutes   [facility] Pager [phone]. If 8PM-8AM, please contact night-coverage at [email or web address], password [facility] 08/09/2012, 8:56 AM  LOS: 2 days

## 2012-08-09 NOTE — Evaluation (Signed)
Clinical/Bedside Swallow Evaluation Patient Details  Name: Niger M Stager MRN: [id number] Date of Birth: November 07, 1921  Today's Date: 08/09/2012 Time: [id number] SLP Time Calculation (min): 28 min  Past Medical History:  Past Medical History  Diagnosis Date  . Hypertension   . Hypercholesteremia   . Myocardial infarction     Inferior STEMI 07/2008 - PCI of prox RCA followed byu CABG x 3 in 9/'09  . Pneumonia   . Anxiety   . CAD in native artery     s/p CABG x 3; Last Myoview in 07/2009 - non-ischemic; Echo 03/2012  Aortic Sclerosis with normal EF.  Marland Kitchen Asthma   . Pacemaker   . GERD (gastroesophageal reflux disease)   . Arthritis    Past Surgical History:  Past Surgical History  Procedure Date  . Coronary artery bypass graft 2009    LIMA-LAD, SVG-RI, SVG-stented RCA  . Total hip arthroplasty   . Pacemaker insertion   . Insert / replace / remove pacemaker   . Wrist fracture surgery 07/2012    left intra articular  with carpel tunnel   HPI:  78 y.o. female admitted after fall, sustaining left hip fracture,  Underwent ORIF 08/08/12.  Post op with c/o odynophagia.  Describes some dysphagia PTA .     Assessment / Plan / Recommendation Clinical Impression  Pt presents with no clinical symptoms of oropharyngeal dysfunction; however, c/o odynophagia (rating of 6 on pain scale), sternal pressure/globus, and belched frequently throughout assessment.  Recommend allowing POs as tolerated; pt may need further esophageal w/u if symptoms do not resolve soon.  No further SLP needs identified.             Diet Recommendation Regular;Thin liquid   Medication Administration: Whole meds with liquid Supervision: Patient able to self feed Postural Changes and/or Swallow Maneuvers: Seated upright 90 degrees    Other  Recommendations Recommended Consults: Consider esophageal assessment Oral Care Recommendations: Oral care BID   Follow Up Recommendations  None    Frequency and Duration   n/a     Pertinent Vitals/Pain 6 - premedicated    Iori Gigante L. Tivis Ringer, [place] CCC/SLP Pager [phone]   Juan Quam Laurice 08/09/2012,10:31 AM

## 2012-08-09 NOTE — Progress Notes (Signed)
Lara Mulch, MD   Carlynn Spry, PA-C [place], [place], [place]  [id number]                             [phone]   PROGRESS NOTE  Subjective:  negative for Chest Pain  negative for Shortness of Breath  negative for Nausea/Vomiting   negative for Calf Pain  negative for Bowel Movement   Tolerating Diet: yes         Patient reports pain as 5 on 0-10 scale.    Objective: Vital signs in last 24 hours:   Patient Vitals for the past 24 hrs:  BP Temp Temp src Pulse Resp SpO2  08/09/12 1052 105/46 mmHg - - 70  - -  08/09/12 0454 92/40 mmHg - - - - -  08/09/12 0445 86/31 mmHg 97.5 F (36.4 C) Oral 69  19  97 %  08/08/12 1805 119/53 mmHg - - 78  17  100 %  08/08/12 1750 122/48 mmHg - - 70  15  98 %  08/08/12 1735 119/43 mmHg - - 70  10  93 %  08/08/12 1720 131/49 mmHg - - 71  17  99 %  08/08/12 1705 124/58 mmHg - - 70  16  99 %  08/08/12 1650 127/49 mmHg - - 75  19  100 %  08/08/12 1635 114/48 mmHg 98.2 F (36.8 C) - 80  15  99 %    @flow {1959:LAST@   Intake/Output from previous day:   10/09 0701 - 10/10 0700 In: 2403 [I.V.:2403] Out: 400 [Urine:300]   Intake/Output this shift:       Intake/Output      10/09 0701 - 10/10 0700 10/10 0701 - 10/11 0700   I.V. (mL/kg) 2403 (35.3)    Total Intake(mL/kg) 2403 (35.3)    Urine (mL/kg/hr) 300 (0.2)    Blood 100    Total Output 400    Net +2003            LABORATORY DATA:  Basename 08/09/12 0520 08/08/12 0600 08/07/12 1141  WBC 14.1* 7.0 5.9  HGB 9.9* 11.5* 13.7  HCT 29.8* 34.3* 40.2  PLT 161 166 182    Basename 08/09/12 0520 08/08/12 0600 08/07/12 1141  NA 134* 134* 136  K 4.8 4.3 3.8  CL 99 99 98  CO2 26 25 29   BUN 24* 23 24*  CREATININE 1.50* 1.32* 1.45*  GLUCOSE 140* 96 102*  CALCIUM 8.7 9.6 10.2   Lab Results  Component Value Date   INR 0.94 08/07/2012   INR 2.5* 08/18/2008   INR 2.5* 08/17/2008    Examination:  General appearance: alert, cooperative and no distress Extremities:  extremities normal, atraumatic, no cyanosis or edema and Homans sign is negative, no sign of DVT  Wound Exam: clean, dry, intact   Drainage:  Scant/small amount Serosanguinous exudate  Motor Exam: EHL and FHL Intact  Sensory Exam: Deep Peroneal normal  Vascular Exam:    Assessment:    1 Day Post-Op  Procedure(s) (LRB): OPEN REDUCTION INTERNAL FIXATION (ORIF) DISTAL RADIAL FRACTURE (Left) ARTHROPLASTY BIPOLAR HIP (Left) CARPAL TUNNEL RELEASE (Left)  ADDITIONAL DIAGNOSIS:  Principal Problem:  *Pre-operative clearance Active Problems:  Radial head fracture, closed, Lt  Hypertension  Hyperlipidemia, statin intol.  CAD, CABG X 3 10/09. Myoview low risk 10/10. EF >55% 2D 6/13.  Fracture of left hip after fall at home.  Aortic valve sclerosis, no stenosis  Carotid bruit present bilat, dopplers OK 6/13  UTI, just finnished 10 days of Cipro  Hx of dizziness, ? orthostatic in setting of UTI ? mild dehydration  Chronic renal insufficiency, stage III (moderate), SCr 1.4 Sept 2013  PAF/SSS  Pacemaker March 2010 (MDT)  DVT after previous hip surgery  Dysphagia, new post op  Acute Blood Loss Anemia   Plan: Physical Therapy as ordered Weight Bearing as Tolerated (WBAT)  DVT Prophylaxis:  Lovenox  DISCHARGE PLAN: Home vs snf  DISCHARGE NEEDS: HHPT, Walker and 3-in-1 comode seat         Diron Haddon 08/09/2012, 12:43 PM

## 2012-08-09 NOTE — Evaluation (Signed)
Physical Therapy Evaluation Patient Details Name: Amy [name] MRN: [id number] DOB: [birth year] Today's Date: 08/09/2012 Time: [id number] PT Time Calculation (min): 35 min  PT Assessment / Plan / Recommendation Clinical Impression  Pt admitted s/p fall with femur fx and radial, s/p ORIF radial fx, L hemiarthroplasty. Pt extremely limited secondary to pain. Pt will benefit from skilled PT in the acute care setting in order to maximize functional mobility and strength to assist with returning towards PLOF.    PT Assessment  Patient needs continued PT services    Follow Up Recommendations  Post acute inpatient    Does the patient have the potential to tolerate intense rehabilitation   No, Recommend SNF  Barriers to Discharge        Equipment Recommendations  3 in 1 bedside comode;Other (comment) (platform RW)    Recommendations for Other Services     Frequency Min 6X/week    Precautions / Restrictions Precautions Precautions: Posterior Hip Precaution Comments: Educated pt on 3/3 posterior hip precautions Restrictions Weight Bearing Restrictions: Yes LLE Weight Bearing: Weight bearing as tolerated   Pertinent Vitals/Pain Pain 8/10. RN aware      Mobility  Bed Mobility Bed Mobility: Supine to Sit;Sitting - Scoot to Edge of Bed;Sit to Supine Supine to Sit: 1: +2 Total assist;HOB elevated Supine to Sit: Patient Percentage: 10% Sitting - Scoot to Edge of Bed: 1: +2 Total assist Sitting - Scoot to Edge of Bed: Patient Percentage: 10% Sit to Supine: 1: +2 Total assist;HOB flat Sit to Supine: Patient Percentage: 10% Details for Bed Mobility Assistance: Pt attempting to assist with RUE. Max cueing throughout to maintain hip precautions.  +2 assist to support trunk and bil LE's with use of draw pad.  Transfers Transfers: Not assessed Ambulation/Gait Ambulation/Gait Assistance: Not tested (comment)    Shoulder Instructions     Exercises     PT Diagnosis: Generalized  weakness;Acute pain  PT Problem List: Decreased strength;Decreased range of motion;Decreased activity tolerance;Decreased balance;Decreased mobility;Decreased knowledge of use of DME;Decreased safety awareness;Decreased knowledge of precautions;Pain PT Treatment Interventions: DME instruction;Gait training;Functional mobility training;Therapeutic activities;Therapeutic exercise;Patient/family education   PT Goals Acute Rehab PT Goals PT Goal Formulation: With patient Time For Goal Achievement: 08/23/12 Potential to Achieve Goals: Fair Pt will go Supine/Side to Sit: with min assist PT Goal: Supine/Side to Sit - Progress: Goal set today Pt will go Sit to Supine/Side: with min assist PT Goal: Sit to Supine/Side - Progress: Goal set today Pt will go Sit to Stand: with min assist PT Goal: Sit to Stand - Progress: Goal set today Pt will go Stand to Sit: with min assist PT Goal: Stand to Sit - Progress: Goal set today Pt will Transfer Bed to Chair/Chair to Bed: with mod assist PT Transfer Goal: Bed to Chair/Chair to Bed - Progress: Goal set today  Visit Information  Last PT Received On: 08/09/12 Assistance Needed: +2 PT/OT Co-Evaluation/Treatment: Yes    Subjective Data  Patient Stated Goal: to feel better   Prior Functioning  Home Living Lives With: Alone Available Help at Discharge: Family;Available PRN/intermittently Type of Home: House Home Access: Stairs to enter [employer] of Steps: 1 Entrance Stairs-Rails: Right Bathroom Shower/Tub: Walk-in shower;Curtain Bathroom Toilet: Handicapped height Bathroom Accessibility: Yes How Accessible: Accessible via walker Home Adaptive Equipment: Shower chair without back;Walker - four wheeled;Quad cane;Straight cane;Bedside commode/3-in-1;Hand-held shower hose;Reacher (life alert) Prior Function Level of Independence: Independent Able to Take Stairs?: Yes Driving: Yes Communication Communication: No difficulties  Cognition  Overall Cognitive Status: Appears within functional limits for tasks assessed/performed Arousal/Alertness: Lethargic Orientation Level: Appears intact for tasks assessed Behavior During Session: [facility] for tasks performed    Extremity/Trunk Assessment Right Upper Extremity Assessment RUE ROM/Strength/Tone: Within functional levels Left Upper Extremity Assessment LUE ROM/Strength/Tone: Deficits;Unable to fully assess;Due to pain;Due to precautions LUE ROM/Strength/Tone Deficits: ROM WNL at digits, elbow, and shoulder.   LUE Sensation: Deficits LUE Sensation Deficits: "fingers feel heavy" Right Lower Extremity Assessment RLE ROM/Strength/Tone: Unable to fully assess;Due to pain;Deficits RLE ROM/Strength/Tone Deficits: Pt able to minimally raise LE and abduct. Ankle WFL RLE Sensation: WFL - Light Touch Left Lower Extremity Assessment LLE ROM/Strength/Tone: Unable to fully assess;Due to pain;Due to precautions LLE Sensation: WFL - Light Touch   Balance Balance Balance Assessed: Yes Static Sitting Balance Static Sitting - Balance Support: Right upper extremity supported;Feet supported Static Sitting - Level of Assistance: 3: Mod assist Static Sitting - Comment/# of Minutes: ~5 minutes sitting EOB   End of Session PT - End of Session Activity Tolerance: Patient limited by pain Patient left: in bed;with call bell/phone within reach;with family/visitor present Nurse Communication: Mobility status    Amy [name] 08/09/2012, 6:31 PM  08/09/2012 Amy [name] DPT PAGER: [phone] OFFICE: [phone]

## 2012-08-10 ENCOUNTER — Encounter (HOSPITAL_COMMUNITY): Payer: Self-pay | Admitting: Certified Registered"

## 2012-08-10 ENCOUNTER — Inpatient Hospital Stay (HOSPITAL_COMMUNITY): Payer: Medicare Other

## 2012-08-10 ENCOUNTER — Inpatient Hospital Stay (HOSPITAL_COMMUNITY): Payer: Medicare Other | Admitting: Certified Registered"

## 2012-08-10 ENCOUNTER — Encounter (HOSPITAL_COMMUNITY): Payer: Self-pay

## 2012-08-10 ENCOUNTER — Encounter (HOSPITAL_COMMUNITY)
Admission: EM | Disposition: A | Discharge status: Other Acute Inpatient Hospital | Payer: Self-pay | Source: Home / Self Care | Attending: Internal Medicine

## 2012-08-10 DIAGNOSIS — K223 Perforation of esophagus: Secondary | ICD-10-CM

## 2012-08-10 HISTORY — PX: ESOPHAGOSCOPY: SHX5534

## 2012-08-10 HISTORY — PX: ESOPHAGOGASTRODUODENOSCOPY: SHX5428

## 2012-08-10 LAB — CBC
HCT: 26.2 % — ABNORMAL LOW (ref 36.0–46.0)
MCH: 29.7 pg (ref 26.0–34.0)
MCHC: 32.8 g/dL (ref 30.0–36.0)
MCV: 90.3 fL (ref 78.0–100.0)
Platelets: 143 10*3/uL — ABNORMAL LOW (ref 150–400)
RDW: 13.5 % (ref 11.5–15.5)

## 2012-08-10 LAB — BASIC METABOLIC PANEL
BUN: 26 mg/dL — ABNORMAL HIGH (ref 6–23)
CO2: 24 mEq/L (ref 19–32)
Calcium: 8.9 mg/dL (ref 8.4–10.5)
Creatinine, Ser: 1.18 mg/dL — ABNORMAL HIGH (ref 0.50–1.10)
Glucose, Bld: 107 mg/dL — ABNORMAL HIGH (ref 70–99)

## 2012-08-10 SURGERY — EGD (ESOPHAGOGASTRODUODENOSCOPY)
Anesthesia: Moderate Sedation

## 2012-08-10 SURGERY — ESOPHAGOSCOPY
Anesthesia: General | Site: Neck | Laterality: Right | Wound class: Clean Contaminated

## 2012-08-10 MED ORDER — PANTOPRAZOLE SODIUM 40 MG IV SOLR
40.0000 mg | Freq: Two times a day (BID) | INTRAVENOUS | Status: DC
  Administered 2012-08-11 – 2012-08-20 (×19): 40 mg via INTRAVENOUS
  Filled 2012-08-10 (×23): qty 40

## 2012-08-10 MED ORDER — LIDOCAINE HCL (PF) 1 % IJ SOLN
INTRAMUSCULAR | Status: AC
  Filled 2012-08-10: qty 5

## 2012-08-10 MED ORDER — MIDAZOLAM HCL 10 MG/2ML IJ SOLN
INTRAMUSCULAR | Status: DC | PRN
  Administered 2012-08-10: 1 mg via INTRAVENOUS

## 2012-08-10 MED ORDER — PROPOFOL 10 MG/ML IV BOLUS
INTRAVENOUS | Status: DC | PRN
  Administered 2012-08-10: 80 mg via INTRAVENOUS

## 2012-08-10 MED ORDER — BISACODYL 10 MG RE SUPP
10.0000 mg | Freq: Once | RECTAL | Status: AC
  Administered 2012-08-10: 10 mg via RECTAL
  Filled 2012-08-10: qty 1

## 2012-08-10 MED ORDER — SODIUM CHLORIDE 0.9 % IV SOLN: INTRAVENOUS | Status: DC

## 2012-08-10 MED ORDER — METOPROLOL TARTRATE 1 MG/ML IV SOLN
2.5000 mg | Freq: Once | INTRAVENOUS | Status: AC
  Administered 2012-08-10: 2.5 mg via INTRAVENOUS
  Filled 2012-08-10: qty 5

## 2012-08-10 MED ORDER — FENTANYL CITRATE 0.05 MG/ML IJ SOLN
INTRAMUSCULAR | Status: AC
  Filled 2012-08-10: qty 2

## 2012-08-10 MED ORDER — PIPERACILLIN-TAZOBACTAM 3.375 G IVPB
3.3750 g | Freq: Three times a day (TID) | INTRAVENOUS | Status: DC
  Administered 2012-08-10 – 2012-08-23 (×39): 3.375 g via INTRAVENOUS
  Filled 2012-08-10 (×45): qty 50

## 2012-08-10 MED ORDER — FENTANYL CITRATE 0.05 MG/ML IJ SOLN
INTRAMUSCULAR | Status: DC | PRN
  Administered 2012-08-10: 50 ug via INTRAVENOUS

## 2012-08-10 MED ORDER — SUCCINYLCHOLINE CHLORIDE 20 MG/ML IJ SOLN
INTRAMUSCULAR | Status: DC | PRN
  Administered 2012-08-10: 80 mg via INTRAVENOUS

## 2012-08-10 MED ORDER — LIDOCAINE VISCOUS 2 % MT SOLN
20.0000 mL | Freq: Once | OROMUCOSAL | Status: DC
  Filled 2012-08-10: qty 20

## 2012-08-10 MED ORDER — MIDAZOLAM HCL 5 MG/ML IJ SOLN
INTRAMUSCULAR | Status: AC
  Filled 2012-08-10: qty 2

## 2012-08-10 MED ORDER — ASPIRIN EC 81 MG PO TBEC
81.0000 mg | DELAYED_RELEASE_TABLET | Freq: Every day | ORAL | Status: DC
  Filled 2012-08-10: qty 1

## 2012-08-10 MED ORDER — MORPHINE SULFATE 2 MG/ML IJ SOLN
1.0000 mg | INTRAMUSCULAR | Status: DC | PRN
  Administered 2012-08-10 – 2012-08-11 (×2): 1 mg via INTRAVENOUS
  Administered 2012-08-11 (×2): 2 mg via INTRAVENOUS

## 2012-08-10 MED ORDER — PHENYLEPHRINE HCL 10 MG/ML IJ SOLN
INTRAMUSCULAR | Status: DC | PRN
  Administered 2012-08-10 (×2): 80 ug via INTRAVENOUS
  Administered 2012-08-10: 160 ug via INTRAVENOUS
  Administered 2012-08-10 (×2): 80 ug via INTRAVENOUS

## 2012-08-10 MED ORDER — METOPROLOL TARTRATE 1 MG/ML IV SOLN
2.5000 mg | Freq: Three times a day (TID) | INTRAVENOUS | Status: DC
  Administered 2012-08-11 – 2012-08-14 (×11): 2.5 mg via INTRAVENOUS
  Filled 2012-08-10 (×16): qty 5

## 2012-08-10 MED ORDER — LIDOCAINE HCL (CARDIAC) 20 MG/ML IV SOLN
INTRAVENOUS | Status: DC | PRN
  Administered 2012-08-10: 80 mg via INTRAVENOUS

## 2012-08-10 MED ORDER — IOHEXOL 300 MG/ML  SOLN
80.0000 mL | Freq: Once | INTRAMUSCULAR | Status: AC | PRN
  Administered 2012-08-10: 80 mL via INTRAVENOUS

## 2012-08-10 MED ORDER — ENOXAPARIN SODIUM 40 MG/0.4ML ~~LOC~~ SOLN: 40.0000 mg | Freq: Two times a day (BID) | SUBCUTANEOUS | Status: DC

## 2012-08-10 MED ORDER — ESMOLOL HCL 10 MG/ML IV SOLN
INTRAVENOUS | Status: DC | PRN
  Administered 2012-08-10: 20 mg via INTRAVENOUS

## 2012-08-10 MED ORDER — DIPHENHYDRAMINE HCL 50 MG/ML IJ SOLN
INTRAMUSCULAR | Status: AC
  Filled 2012-08-10: qty 1

## 2012-08-10 MED ORDER — LACTATED RINGERS IV SOLN
INTRAVENOUS | Status: DC | PRN
  Administered 2012-08-10: 22:00:00 via INTRAVENOUS

## 2012-08-10 MED ORDER — OXYCODONE HCL 5 MG PO TABS
5.0000 mg | ORAL_TABLET | Freq: Three times a day (TID) | ORAL | Status: DC | PRN
  Administered 2012-08-10: 5 mg via ORAL
  Filled 2012-08-10: qty 1

## 2012-08-10 MED ORDER — TRAMADOL-ACETAMINOPHEN 37.5-325 MG PO TABS
1.0000 | ORAL_TABLET | Freq: Four times a day (QID) | ORAL | Status: DC | PRN
  Filled 2012-08-10: qty 1

## 2012-08-10 SURGICAL SUPPLY — 59 items
APPLIER CLIP 9.375 SM OPEN (CLIP) ×4
ATTRACTOMAT 16X20 MAGNETIC DRP (DRAPES) ×4 IMPLANT
BANDAGE GAUZE ELAST BULKY 4 IN (GAUZE/BANDAGES/DRESSINGS) ×4 IMPLANT
BLADE SURG 15 STRL LF DISP TIS (BLADE) IMPLANT
BLADE SURG 15 STRL SS (BLADE)
CANISTER SUCTION 2500CC (MISCELLANEOUS) ×4 IMPLANT
CATH ROBINSON RED A/P 12FR (CATHETERS) ×4 IMPLANT
CLEANER TIP ELECTROSURG 2X2 (MISCELLANEOUS) ×4 IMPLANT
CLIP APPLIE 9.375 SM OPEN (CLIP) ×3 IMPLANT
CLOTH BEACON ORANGE TIMEOUT ST (SAFETY) ×4 IMPLANT
CONT SPEC 4OZ CLIKSEAL STRL BL (MISCELLANEOUS) ×4 IMPLANT
CORDS BIPOLAR (ELECTRODE) ×4 IMPLANT
COVER SURGICAL LIGHT HANDLE (MISCELLANEOUS) ×4 IMPLANT
CRADLE DONUT ADULT HEAD (MISCELLANEOUS) ×4 IMPLANT
DRAIN CHANNEL 10F 3/8 F FF (DRAIN) ×4 IMPLANT
DRAIN CHANNEL 15F RND FF W/TCR (WOUND CARE) IMPLANT
DRAIN PENROSE 1/2X12 LTX STRL (WOUND CARE) ×4 IMPLANT
DRSG MEPILEX BORDER 4X8 (GAUZE/BANDAGES/DRESSINGS) ×4 IMPLANT
ELECT COATED BLADE 2.86 ST (ELECTRODE) ×4 IMPLANT
ELECT REM PT RETURN 9FT ADLT (ELECTROSURGICAL) ×4
ELECTRODE REM PT RTRN 9FT ADLT (ELECTROSURGICAL) ×3 IMPLANT
EVACUATOR SILICONE 100CC (DRAIN) IMPLANT
GAUZE SPONGE 4X4 16PLY XRAY LF (GAUZE/BANDAGES/DRESSINGS) ×4 IMPLANT
GLOVE BIOGEL PI IND STRL 6.5 (GLOVE) ×3 IMPLANT
GLOVE BIOGEL PI INDICATOR 6.5 (GLOVE) ×1
GLOVE ECLIPSE 8.0 STRL XLNG CF (GLOVE) ×8 IMPLANT
GLOVE SURG SIGNA 7.5 PF LTX (GLOVE) ×4 IMPLANT
GOWN STRL NON-REIN LRG LVL3 (GOWN DISPOSABLE) ×4 IMPLANT
GOWN STRL REIN XL XLG (GOWN DISPOSABLE) ×4 IMPLANT
KIT BASIN OR (CUSTOM PROCEDURE TRAY) ×4 IMPLANT
KIT ROOM TURNOVER OR (KITS) ×4 IMPLANT
LOCATOR NERVE 3 VOLT (DISPOSABLE) IMPLANT
NS IRRIG 1000ML POUR BTL (IV SOLUTION) ×8 IMPLANT
PAD ARMBOARD 7.5X6 YLW CONV (MISCELLANEOUS) ×8 IMPLANT
PENCIL BUTTON HOLSTER BLD 10FT (ELECTRODE) ×4 IMPLANT
SPECIMEN JAR MEDIUM (MISCELLANEOUS) IMPLANT
SPONGE INTESTINAL PEANUT (DISPOSABLE) ×4 IMPLANT
SPONGE LAP 18X18 X RAY DECT (DISPOSABLE) ×4 IMPLANT
STAPLER VISISTAT 35W (STAPLE) ×4 IMPLANT
SUT CHROMIC 4 0 PS 2 18 (SUTURE) IMPLANT
SUT CHROMIC 5 0 P 3 (SUTURE) IMPLANT
SUT ETHILON 3 0 PS 1 (SUTURE) ×4 IMPLANT
SUT ETHILON 5 0 PS 2 18 (SUTURE) IMPLANT
SUT SILK 2 0 REEL (SUTURE) IMPLANT
SUT SILK 2 0 SH CR/8 (SUTURE) ×4 IMPLANT
SUT SILK 3 0 REEL (SUTURE) ×4 IMPLANT
SUT SILK 4 0 REEL (SUTURE) ×4 IMPLANT
SWAB CULTURE LIQUID MINI MALE (MISCELLANEOUS) ×4 IMPLANT
SYRINGE IRR TOOMEY STRL 70CC (SYRINGE) ×4 IMPLANT
TAPE CLOTH SOFT 2X10 (GAUZE/BANDAGES/DRESSINGS) ×4 IMPLANT
TOWEL OR 17X24 6PK STRL BLUE (TOWEL DISPOSABLE) ×4 IMPLANT
TOWEL OR 17X26 10 PK STRL BLUE (TOWEL DISPOSABLE) ×4 IMPLANT
TRAY ENT MC OR (CUSTOM PROCEDURE TRAY) ×4 IMPLANT
TRAY FOLEY CATH 14FRSI W/METER (CATHETERS) IMPLANT
TUBE ANAEROBIC SPECIMEN COL (MISCELLANEOUS) ×4 IMPLANT
TUBE FEEDING 10FR FLEXIFLO (MISCELLANEOUS) IMPLANT
TUBE SALEM SUMP 10F W/ARV (TUBING) ×4 IMPLANT
TUBE SALEM SUMP 14F W/ARV (TUBING) ×4 IMPLANT
WATER STERILE IRR 1000ML POUR (IV SOLUTION) ×4 IMPLANT

## 2012-08-10 NOTE — Progress Notes (Signed)
Amy Mulch, MD   Carlynn Spry, PA-C [place], [place], [place]  [address]                             [phone]   PROGRESS NOTE  Subjective:  negative for Chest Pain  negative for Shortness of Breath  negative for Nausea/Vomiting   negative for Calf Pain  negative for Bowel Movement   Tolerating Diet: yes         Patient reports pain as 5 on 0-10 scale.    Objective: Vital signs in last 24 hours:   Patient Vitals for the past 24 hrs:  BP Temp Pulse Resp SpO2  08/10/12 0600 111/43 mmHg 98 F (36.7 C) 94  18  97 %  08/09/12 1400 100/33 mmHg 98.5 F (36.9 C) 73  16  97 %  08/09/12 1052 105/46 mmHg - 70  - -    @flow {1959:LAST@   Intake/Output from previous day:   10/10 0701 - 10/11 0700 In: 60 [P.O.:60] Out: 325 [Urine:325]   Intake/Output this shift:       Intake/Output      10/10 0701 - 10/11 0700 10/11 0701 - 10/12 0700   P.O. 60    I.V. (mL/kg)     Total Intake(mL/kg) 60 (0.9)    Urine (mL/kg/hr) 325 (0.2)    Blood     Total Output 325    Net -265         Urine Occurrence 1 x       LABORATORY DATA:  Basename 08/10/12 0600 08/09/12 0520 08/08/12 0600 08/07/12 1141  WBC 11.7* 14.1* 7.0 5.9  HGB 8.6* 9.9* 11.5* 13.7  HCT 26.2* 29.8* 34.3* 40.2  PLT 143* 161 166 182    Basename 08/10/12 0600 08/09/12 0520 08/08/12 0600 08/07/12 1141  NA 136 134* 134* 136  K 4.3 4.8 4.3 3.8  CL 101 99 99 98  CO2 24 26 25 29   BUN 26* 24* 23 24*  CREATININE 1.18* 1.50* 1.32* 1.45*  GLUCOSE 107* 140* 96 102*  CALCIUM 8.9 8.7 9.6 10.2   Lab Results  Component Value Date   INR 0.94 08/07/2012   INR 2.5* 08/18/2008   INR 2.5* 08/17/2008    Examination:  General appearance: alert, cooperative and no distress Extremities: Homans sign is negative, no sign of DVT  Wound Exam: clean, dry, intact   Drainage:  Scant/small amount Serosanguinous exudate  Motor Exam: EHL and FHL Intact  Sensory Exam: Deep Peroneal normal  Vascular Exam:     Assessment:    2 Days Post-Op  Procedure(s) (LRB): OPEN REDUCTION INTERNAL FIXATION (ORIF) DISTAL RADIAL FRACTURE (Left) ARTHROPLASTY BIPOLAR HIP (Left) CARPAL TUNNEL RELEASE (Left)  ADDITIONAL DIAGNOSIS:  Principal Problem:  *Pre-operative clearance Active Problems:  Radial head fracture, closed, Lt  Hypertension  Hyperlipidemia, statin intol.  CAD, CABG X 3 10/09. Myoview low risk 10/10. EF >55% 2D 6/13.  Fracture of left hip after fall at home.  Aortic valve sclerosis, no stenosis  Carotid bruit present bilat, dopplers OK 6/13  UTI, just finnished 10 days of Cipro  Hx of dizziness, ? orthostatic in setting of UTI ? mild dehydration  Chronic renal insufficiency, stage III (moderate), SCr 1.4 Sept 2013  PAF/SSS  Pacemaker March 2010 (MDT)  DVT after previous hip surgery  Dysphagia, new post op  Acute Blood Loss Anemia   Plan: Physical Therapy as ordered Weight Bearing as Tolerated (WBAT)  DVT Prophylaxis:  Lovenox  DISCHARGE PLAN: Skilled Nursing Facility/Rehab  DISCHARGE NEEDS: HHPT, Walker and 3-in-1 comode seat         [name] 08/10/2012, 8:24 AM

## 2012-08-10 NOTE — Interval H&P Note (Signed)
History and Physical Interval Note:  08/10/2012 2:19 PM  Amy [name]  has presented today for surgery, with the diagnosis of Dysphagia  The various methods of treatment have been discussed with the patient and family. After consideration of risks, benefits and other options for treatment, the patient has consented to  Procedure(s) (LRB) with comments: ESOPHAGOGASTRODUODENOSCOPY (EGD) (N/A) as a surgical intervention .  The patient's history has been reviewed, patient examined, no change in status, stable for surgery.  I have reviewed the patient's chart and labs.  Questions were answered to the patient's satisfaction.     [name]

## 2012-08-10 NOTE — Consult Note (Addendum)
Unassigned Consult  Reason for Consult:Odynophagia Referring Physician: [facility]  Niger M Kozloski HPI: This is an 78 year old female admitted for a left wrist and hip fracture after a fall.  She was taken to the OR the next day for surgical repair and post-operatively she complained of odynophagia.  She was treated with PPIs, but she continued to have pain with swallowing.  A Speech Path consult was obtained, but there was no evidence of oropharyngeal dysphagia.  Subsequently a GI consult was requested.  The patient was started on Cipro one week ago for a UTI.  No prior issues with dysphagia or odynophagia, but she has a history of GERD.  Past Medical History  Diagnosis Date  . Hypertension   . Hypercholesteremia   . Myocardial infarction     Inferior STEMI 07/2008 - PCI of prox RCA followed byu CABG x 3 in 9/'09  . Pneumonia   . Anxiety   . CAD in native artery     s/p CABG x 3; Last Myoview in 07/2009 - non-ischemic; Echo 03/2012  Aortic Sclerosis with normal EF.  Marland Kitchen Asthma   . Pacemaker   . GERD (gastroesophageal reflux disease)   . Arthritis     Past Surgical History  Procedure Date  . Coronary artery bypass graft 2009    LIMA-LAD, SVG-RI, SVG-stented RCA  . Total hip arthroplasty   . Pacemaker insertion   . Insert / replace / remove pacemaker   . Wrist fracture surgery 07/2012    left intra articular  with carpel tunnel  . Carpal tunnel release 08/08/2012    Procedure: CARPAL TUNNEL RELEASE;  Surgeon: Schuyler Amor, MD;  Location: [facility];  Service: Orthopedics;  Laterality: Left;  . Hip arthroplasty 08/08/2012    Procedure: ARTHROPLASTY BIPOLAR HIP;  Surgeon: Rudean Haskell, MD;  Location: [facility];  Service: Orthopedics;  Laterality: Left;  [place]     Family History  Problem Relation Age of Onset  . Hypertension Mother   . Diabetes Mother   . Cancer Mother   . Hypertension Father   . Stroke Father     Social History:  reports that she has never smoked. She  has never used smokeless tobacco. She reports that she does not drink alcohol or use illicit drugs.  Allergies:  Allergies  Allergen Reactions  . Hydrocodone-Acetaminophen Itching    Tolerates tylenol    Medications:  Scheduled:   . aspirin EC  81 mg Oral Daily  . bisacodyl  10 mg Rectal Once  . enoxaparin  40 mg Subcutaneous Q24H  . metoprolol  2.5 mg Intravenous Once  . metoprolol succinate  50 mg Oral Daily  . pantoprazole  40 mg Oral Q1200  . sodium chloride  3 mL Intravenous Q12H   Continuous:   . sodium chloride 75 mL/hr (08/10/12 1115)  . sodium chloride      Results for orders placed during the hospital encounter of 08/07/12 (from the past 24 hour(s))  BASIC METABOLIC PANEL     Status: Abnormal   Collection Time   08/10/12  6:00 AM      Component Value Range   Sodium 136  135 - 145 mEq/L   Potassium 4.3  3.5 - 5.1 mEq/L   Chloride 101  96 - 112 mEq/L   CO2 24  19 - 32 mEq/L   Glucose, Bld 107 (*) 70 - 99 mg/dL   BUN 26 (*) 6 - 23 mg/dL   Creatinine, Ser  1.18 (*) 0.50 - 1.10 mg/dL   Calcium 8.9  8.4 - 10.5 mg/dL   GFR calc non Af Amer 40 (*) >90 mL/min   GFR calc Af Amer 46 (*) >90 mL/min  CBC     Status: Abnormal   Collection Time   08/10/12  6:00 AM      Component Value Range   WBC 11.7 (*) 4.0 - 10.5 K/uL   RBC 2.90 (*) 3.87 - 5.11 MIL/uL   Hemoglobin 8.6 (*) 12.0 - 15.0 g/dL   HCT 26.2 (*) 36.0 - 46.0 %   MCV 90.3  78.0 - 100.0 fL   MCH 29.7  26.0 - 34.0 pg   MCHC 32.8  30.0 - 36.0 g/dL   RDW 13.5  11.5 - 15.5 %   Platelets 143 (*) 150 - 400 K/uL     Dg Foot Complete Right  08/09/2012  *RADIOLOGY REPORT*  Clinical Data: Right foot pain.  Postop surgery for fracture left hip and left radial fracture.  RIGHT FOOT COMPLETE - 3+ VIEW  Comparison: Radiographs 08/06/2011.  Findings: There is motion on the AP view.  Mild osteopenia is noted.  The previously noted fracture involving the base of the fifth metatarsal has healed.  There is no evidence of  acute fracture or dislocation.  There are stable degenerative changes of the first metatarsal phalangeal joint.  The tibial sesamoid of the first metatarsal is bipartite.  IMPRESSION: No acute osseous findings.   Original Report Authenticated By: Vivia Ewing, M.D.     ROS:  As stated above in the HPI otherwise negative.  Blood pressure 119/45, pulse 109, temperature 97.7 F (36.5 C), temperature source Oral, resp. rate 17, height 5\' 5"  (1.651 m), weight 68.04 kg (150 lb), SpO2 85.00%.    PE: Gen: NAD, Alert and Oriented HEENT:  [place]/AT, EOMI Neck: Supple, no LAD Lungs: CTA Bilaterally CV: RRR without M/G/R ABM: Soft, NTND, +BS Ext: No C/C/E  Assessment/Plan: 1) Odynophagia. 2) Multiple medical problems.   Given her recent history of Cipro and the complaints of odynophagia she may have a Candidal esophagitis.  I will perform an EGD for further evaluation.  Plan: 1) EGD now and further recommendations pending the findings.  Jayanth Szczesniak D 08/10/2012, 2:42 PM    The esophagram is positive for a contained esophageal perforation just below the thoracic inlet.  The perforation appears to be secondary to the intubation at the time of her surgery.  I called Thoracic surgery for a consultation and also let Dr. Veatrice Kells know about the findings.  I also discussed the esophagram findings with the daughter.

## 2012-08-10 NOTE — Progress Notes (Signed)
11 OCT 13  CT chest and neck show air and contrast extravasation tracking down into RIGHT mediastinum.  Discussed with Dr. Roxy Manns.  We feel the need to perform basic I&D to prevent progression of mediastinal involvement.  Will discuss with patient and family.  [name] MD

## 2012-08-10 NOTE — Clinical Social Work Psychosocial (Signed)
     Clinical Social Work Department BRIEF PSYCHOSOCIAL ASSESSMENT 08/10/2012  Patient:  Amy [name], Amy [name]     Account Number:  [id number]     Admit date:  08/07/2012  Clinical Social Worker:  Iona Coach  Date/Time:  08/10/2012 12:00 N  Referred by:  Physician  Date Referred:  08/08/2012 Referred for  SNF Placement   Other Referral:   Interview type:  Other - See comment Other interview type:   Patient and daughter Amy [name]    PSYCHOSOCIAL DATA Living Status:  ALONE Admitted from facility:   Level of care:   Primary support name:  Amy [name] Primary support relationship to patient:  CHILD, ADULT Degree of support available:   Strong support    CURRENT CONCERNS Current Concerns  Post-Acute Placement   Other Concerns:    SOCIAL WORK ASSESSMENT / PLAN Met with patient and daughter today. They are requesting short term SNF and hope for the [facility] in [place].  Pt has been a resident there in the past. Patient lives alone and requires short term SNF for rehab. Fl2 completed and faxed to facility. Awaiting call back from SNF regarding possible bed offer.   Assessment/plan status:  Psychosocial Support/Ongoing Assessment of Needs Other assessment/ plan:   Information/referral to community resources:   None at this time    PATIENTS/FAMILYS RESPONSE TO PLAN OF CARE: Patient and daughter are pleased with d/c plan and hope for SNF placement at the [facility]. 2nd choice is [facility] as patient prefers to remain in [place] if possible.

## 2012-08-10 NOTE — Transfer of Care (Signed)
Immediate Anesthesia Transfer of Care Note  Patient: Amy [name]  Procedure(s) Performed: Procedure(s) (LRB) with comments: ESOPHAGOSCOPY (N/A) INCISION AND DRAINAGE (Right) - exploration of perforated esophagus  Patient Location: PACU  Anesthesia Type: General  Level of Consciousness: awake, alert  and patient cooperative  Airway & Oxygen Therapy: Patient Spontanous Breathing and Patient connected to face mask oxygen  Post-op Assessment: Report given to PACU RN and Post -op Vital signs reviewed and stable  Post vital signs: Reviewed and stable  Complications: No apparent anesthesia complications

## 2012-08-10 NOTE — Anesthesia Procedure Notes (Signed)
Procedure Name: Intubation Date/Time: 08/10/2012 10:02 PM Performed by: Manuela Schwartz B Pre-anesthesia Checklist: Patient identified, Emergency Drugs available, Patient being monitored, Suction available and Timeout performed Patient Re-evaluated:Patient Re-evaluated prior to inductionOxygen Delivery Method: Circle system utilized Preoxygenation: Pre-oxygenation with 100% oxygen Intubation Type: IV induction and Rapid sequence Laryngoscope Size: Mac and 3 Grade View: Grade I Tube type: Oral Tube size: 7.5 mm Number of attempts: 1 Airway Equipment and Method: Stylet Placement Confirmation: ETT inserted through vocal cords under direct vision,  positive ETCO2 and breath sounds checked- equal and bilateral Secured at: 21 cm Tube secured with: Tape Dental Injury: Teeth and Oropharynx as per pre-operative assessment

## 2012-08-10 NOTE — H&P (View-Only) (Signed)
[facility] PROGRESS NOTE  Amy [name] [id number] DOB: 07/25/1922 DOA: 08/07/2012 PCP: Purvis Kilts, MD  Assessment/Plan: Principal Problem:  *Pre-operative clearance Active Problems:  Radial head fracture, closed, Lt  Hypertension  Hyperlipidemia, statin intol.  CAD, CABG X 3 10/09. Myoview low risk 10/10. EF >55% 2D 6/13.  Fracture of left hip after fall at home.  Aortic valve sclerosis, no stenosis  Carotid bruit present bilat, dopplers OK 6/13  UTI, just finnished 10 days of Cipro  Hx of dizziness, ? orthostatic in setting of UTI ? mild dehydration  Chronic renal insufficiency, stage III (moderate), SCr 1.4 Sept 2013  PAF/SSS  Pacemaker March 2010 (MDT)  DVT after previous hip surgery  Dysphagia, new post op    Radial fracture/impacted hip fracture,, given extensive history including underlying coronary artery disease, status post CABG, pacemaker placement, CAD, CABG X 3 10/09. Myoview low risk 10/10. EF >55% 2D 6/13  cardiology clearance obtained, status post Open reduction and internal fixation, distal radius fracture  left distal radius, left carpal tunnel release. Awaiting SNF placement   Dysphagia, per speech therapy consultation OK to her regular diet with thin liquids, she start regular diet continue PPI because of history of GERD , patient says liquids are getting stuck , still can't swallow, GI consult called to dr Collene Mares  Right ankle pain x-rays done yesterday did not show any obvious fracture   Paroxysmal A. fib now resolved, will remain high risk for postoperative atrial fibrillation, continue perioperative beta blocker , rate controlled   Hypertension holding ARB and hydrochlorothiazide, due to mild renal insufficiency , postoperative hypotension,    Recent UTI status post treatment with ciprofloxacin    Coronary artery disease we'll cycle cardiac enzymes, history of Inferior STEMI 07/2008 - PCI of prox RCA followed byu CABG x 3 in 9/'09 ,  continue aspirin    Acute blood loss anemia hemoglobin has dropped to 8.9, will transfuse if dropped below 7 without any active bleeding    Dyslipidemia on niacin and fish which will be continued    Chronic kidney disease, stage III, creatinine at baseline, improving everyday    Code Status: full  Family Communication: family updated about patient's clinical progress  Disposition Plan: SNF when bed available      Brief narrative:  78 year old female who lives alone, who had a mechanical fall while going up the steps at her front of her home. She states she did not experience any loss of consciousness. She denies any chest pain, palpitations. At that time she denied lightheadedness however, her daughter states her blood pressures been running in the low 100s recently and the patient is also been having some dizziness. The patient is also been having some inner ear issues and has been on Antivert as needed.  The patient's last major surgery with a CABG 4 years ago along with a postoperative pacemaker. She states she does not get chest pains, no history of CHF. She does have some chronic shortness of breath for years which remains unchanged.  Enroute in the ambulance she did have a episode off A. fib with RVR heart rate to 120. She spontaneously reverted to sinus rhythm. Patient transferred from [facility]  Consultants:  Orthopedics  Cardiology    HPI/Subjective:  Difficulty swallowing, but improved  Objective: Filed Vitals:   08/09/12 0454 08/09/12 1052 08/09/12 1400 08/10/12 0600  BP: 92/40 105/46 100/33 111/43  Pulse:  70 73 94  Temp:   98.5 F (36.9 C) 98 F (  36.7 C)  TempSrc:      Resp:   16 18  Weight:      SpO2:   97% 97%    Intake/Output Summary (Last 24 hours) at 08/10/12 0847 Last data filed at 08/09/12 1500  Gross per 24 hour  Intake      0 ml  Output    325 ml  Net   -325 ml    Exam:  Head: Atraumatic.  Nose: Nose normal.  Mouth/Throat: Oropharynx is  clear and moist.  Eyes: Conjunctivae are normal. Pupils are equal, round, and reactive to light. No scleral icterus.  Neck: Neck supple. No tracheal deviation present.  Cardiovascular: Normal rate, regular rhythm, normal heart sounds and intact distal pulses.  Pulmonary/Chest: Effort normal and breath sounds normal. No respiratory distress.  Abdominal: Soft. Normal appearance and bowel sounds are normal. She exhibits no distension. There is no tenderness.  Left wrist: She exhibits tenderness, bony tenderness and deformity.  Neurological: She is alert and oriented to person, place, and time.  Psychiatric: She has a normal mood and affect. Her behavior is normal. Judgment and thought content normal    Data Reviewed: Basic Metabolic Panel:  Lab [id number] 0600 08/09/12 0520 08/08/12 0600 08/07/12 1141  NA 136 134* 134* 136  K 4.3 4.8 4.3 3.8  CL 101 99 99 98  CO2 24 26 25 29   GLUCOSE 107* 140* 96 102*  BUN 26* 24* 23 24*  CREATININE 1.18* 1.50* 1.32* 1.45*  CALCIUM 8.9 8.7 9.6 10.2  MG -- -- -- --  PHOS -- -- -- --    Liver Function Tests:  Lab 08/07/12 1141  AST 33  ALT 21  ALKPHOS 87  BILITOT 0.3  PROT 6.9  ALBUMIN 3.7   No results found for this basename: LIPASE:5,AMYLASE:5 in the last 168 hours No results found for this basename: AMMONIA:5 in the last 168 hours  CBC:  Lab 08/10/12 0600 08/09/12 0520 08/08/12 0600 08/07/12 1141  WBC 11.7* 14.1* 7.0 5.9  NEUTROABS -- -- -- 4.2  HGB 8.6* 9.9* 11.5* 13.7  HCT 26.2* 29.8* 34.3* 40.2  MCV 90.3 90.0 89.1 88.7  PLT 143* 161 166 182    Cardiac Enzymes: No results found for this basename: CKTOTAL:5,CKMB:5,CKMBINDEX:5,TROPONINI:5 in the last 168 hours BNP (last 3 results) No results found for this basename: PROBNP:3 in the last 8760 hours   CBG: No results found for this basename: GLUCAP:5 in the last 168 hours  Recent Results (from the past 240 hour(s))  URINE CULTURE     Status: Normal   Collection Time   08/07/12  11:24 AM      Component Value Range Status Comment   Specimen Description URINE, CATHETERIZED   Final    Special Requests NONE   Final    Culture  Setup Time 08/07/2012 16:50   Final    Colony Count NO GROWTH   Final    Culture NO GROWTH   Final    Report Status 08/08/2012 FINAL   Final   SURGICAL PCR SCREEN     Status: Normal   Collection Time   08/08/12  5:32 AM      Component Value Range Status Comment   MRSA, PCR NEGATIVE  NEGATIVE Final    Staphylococcus aureus NEGATIVE  NEGATIVE Final      Studies: Dg Chest 1 View  08/07/2012  *RADIOLOGY REPORT*  Clinical Data: Fall  CHEST - 1 VIEW  Comparison: 03/25/2012  Findings: Left subclavian sequential transvenous  pacemaker leads project at right atrium and right ventricle. Upper normal heart size post CABG. Calcified tortuous aorta. Pulmonary vascularity normal. Lungs grossly clear. No pleural effusion or pneumothorax. Minimal biapical scarring. Diffuse osseous demineralization.  IMPRESSION: Post CABG and pacemaker. No acute abnormalities.   Original Report Authenticated By: Burnetta Sabin, M.D.    Dg Wrist Complete Left  08/07/2012  *RADIOLOGY REPORT*  Clinical Data: Left wrist pain and swelling post fall  LEFT WRIST - COMPLETE 3+ VIEW  Comparison: 03/07/2005  Findings: Minimally displaced ulnar styloid fracture. Comminuted distal left radial metaphyseal fracture with intra- articular extension at the radiocarpal joint. Question extension into the distal radioulnar joint as well. Dorsal tilt of the distal radial articular surface. Overlying soft tissue swelling. Minimal degenerative changes at first [facility] joint. No additional acute fracture or dislocation identified. Osseous demineralization.  IMPRESSION: Ulnar styloid fracture. Angulated mildly displaced intra-articular distal left radial metaphyseal fracture. Osseous demineralization.   Original Report Authenticated By: Burnetta Sabin, M.D.    Dg Hip Complete Left  08/07/2012  *RADIOLOGY REPORT*   Clinical Data: Left hip pain post fall  LEFT HIP - COMPLETE 2+ VIEW  Comparison: None  Findings: Minimally impacted left femoral neck fracture. Osseous mineralization appears diffusely decreased. Left hip joint space preserved. No additional fracture or dislocation identified. Symmetric SI joints. Pelvis appears intact.  IMPRESSION: Minimally impacted left femoral neck fracture.   Original Report Authenticated By: Burnetta Sabin, M.D.    Ct Head Wo Contrast  08/07/2012  *RADIOLOGY REPORT*  Clinical Data:  Fall.  CT HEAD WITHOUT CONTRAST CT CERVICAL SPINE WITHOUT CONTRAST  Technique:  Multidetector CT imaging of the head and cervical spine was performed following the standard protocol without intravenous contrast.  Multiplanar CT image reconstructions of the cervical spine were also generated.  Comparison:   None  CT HEAD  Findings: No skull fracture or intracranial hemorrhage.  No CT evidence of large acute infarct.  Mild small vessel disease type changes.  No hydrocephalus.  No intracranial mass lesion detected on this unenhanced exam.  Vascular calcifications.  IMPRESSION: No skull fracture or intracranial hemorrhage.  CT CERVICAL SPINE  Findings: No cervical spine fracture.  Dextroscoliosis and degenerative changes.  Spinal stenosis most notable C4-5.  Biapical lung scarring.  Carotid calcifications.  IMPRESSION: No cervical spine fracture.  Cervical spondylotic changes with spinal stenosis most notable C4- 5.   Original Report Authenticated By: Doug Sou, M.D.    Ct Cervical Spine Wo Contrast  08/07/2012  *RADIOLOGY REPORT*  Clinical Data:  Fall.  CT HEAD WITHOUT CONTRAST CT CERVICAL SPINE WITHOUT CONTRAST  Technique:  Multidetector CT imaging of the head and cervical spine was performed following the standard protocol without intravenous contrast.  Multiplanar CT image reconstructions of the cervical spine were also generated.  Comparison:   None  CT HEAD  Findings: No skull fracture or intracranial  hemorrhage.  No CT evidence of large acute infarct.  Mild small vessel disease type changes.  No hydrocephalus.  No intracranial mass lesion detected on this unenhanced exam.  Vascular calcifications.  IMPRESSION: No skull fracture or intracranial hemorrhage.  CT CERVICAL SPINE  Findings: No cervical spine fracture.  Dextroscoliosis and degenerative changes.  Spinal stenosis most notable C4-5.  Biapical lung scarring.  Carotid calcifications.  IMPRESSION: No cervical spine fracture.  Cervical spondylotic changes with spinal stenosis most notable C4- 5.   Original Report Authenticated By: Doug Sou, M.D.    Dg Chest [facility]  08/08/2012  *RADIOLOGY REPORT*  Clinical Data: Preoperative study.  Shortness of breath.  PORTABLE CHEST - 1 VIEW  Comparison: Chest x-ray 08/07/2012.  Findings: The lung volumes are normal.  No consolidative airspace disease.  No definite pleural effusions.  Bilateral apical pleuroparenchymal thickening, similar to prior examinations, most compatible with scarring.  Prominence of the central pulmonary arteries could suggest pulmonary arterial hypertension. Mild pulmonary venous congestion without frank pulmonary edema.  Heart size is mildly enlarged (unchanged). The patient is rotated to the right on today's exam, resulting in distortion of the mediastinal contours and reduced diagnostic sensitivity and specificity for mediastinal pathology.  Atherosclerotic calcifications are noted within the arch of the aorta.  Left-sided pacemaker device in place with lead tips projecting over the expected location of the right atrium and right ventricular apex.  IMPRESSION: 1.  Cardiomegaly with mild pulmonary venous congestion, but no frank pulmonary edema. 2.  In addition, there is dilatation of the central pulmonary arteries, which could suggest pulmonary arterial hypertension. 3.  Atherosclerosis. 4.  Postoperative changes and support apparatus, as above.   Original Report Authenticated By:  Etheleen Mayhew, M.D.    Dg Shoulder Left  08/08/2012  *RADIOLOGY REPORT*  Clinical Data: Pain.  Recent injury.  LEFT SHOULDER - 2+ VIEW  Comparison: None.  Findings: No evidence of fracture or dislocation.  No significant degenerative change of the glenohumeral joint.  Ordinary osteoarthritis of the AC joint.  Pacemaker overlies the region. There are old appearing left rib fractures, not primarily evaluated.  IMPRESSION: No evidence of fracture or dislocation.  Osteoarthritis of the [facility] joint.   Original Report Authenticated By: Jules Schick, M.D.    Dg Foot Complete Right  08/09/2012  *RADIOLOGY REPORT*  Clinical Data: Right foot pain.  Postop surgery for fracture left hip and left radial fracture.  RIGHT FOOT COMPLETE - 3+ VIEW  Comparison: Radiographs 08/06/2011.  Findings: There is motion on the AP view.  Mild osteopenia is noted.  The previously noted fracture involving the base of the fifth metatarsal has healed.  There is no evidence of acute fracture or dislocation.  There are stable degenerative changes of the first metatarsal phalangeal joint.  The tibial sesamoid of the first metatarsal is bipartite.  IMPRESSION: No acute osseous findings.   Original Report Authenticated By: Vivia Ewing, M.D.     Scheduled Meds:   . enoxaparin  40 mg Subcutaneous Q24H  . metoprolol succinate  50 mg Oral Daily  . pantoprazole  40 mg Oral Q1200  . sodium chloride  3 mL Intravenous Q12H  . DISCONTD: enoxaparin (LOVENOX) injection  30 mg Subcutaneous Daily   Continuous Infusions:   . sodium chloride 100 mL/hr (08/09/12 0914)    Principal Problem:  *Pre-operative clearance Active Problems:  Radial head fracture, closed, Lt  Hypertension  Hyperlipidemia, statin intol.  CAD, CABG X 3 10/09. Myoview low risk 10/10. EF >55% 2D 6/13.  Fracture of left hip after fall at home.  Aortic valve sclerosis, no stenosis  Carotid bruit present bilat, dopplers OK 6/13  UTI, just finnished 10 days of  Cipro  Hx of dizziness, ? orthostatic in setting of UTI ? mild dehydration  Chronic renal insufficiency, stage III (moderate), SCr 1.4 Sept 2013  PAF/SSS  Pacemaker March 2010 (MDT)  DVT after previous hip surgery  Dysphagia, new post op    Time spent: 40 minutes   [facility] Pager [phone]. If 8PM-8AM, please contact night-coverage at [email or web address], password  [email or web address] 08/10/2012, 8:47 AM  LOS: 3 days

## 2012-08-10 NOTE — Op Note (Signed)
[facility] [place], [id number]   OPERATIVE PROCEDURE REPORT  PATIENT: [name], Amy M.  MR#: [id number] BIRTHDATE: [birth year]  GENDER: Female ENDOSCOPIST: Carol Ada, MD ASSISTANT:   Mahala Menghini, Technician and Berenda Morale, RN PROCEDURE DATE: 08/10/2012 PROCEDURE:   EGD, diagnostic ASA CLASS:   Class III INDICATIONS:Odynophagia. MEDICATIONS: Versed 1mg  IV TOPICAL ANESTHETIC:   none  DESCRIPTION OF PROCEDURE:   After the risks benefits and alternatives of the procedure were thoroughly explained, informed consent was obtained.  The Pentax Gastroscope [id number]  endoscope was introduced through the mouth  and advanced to the second portion of the duodenum Without limitations.      The instrument was slowly withdrawn as the mucosa was fully examined.      ESOPHAGUS: Just below the UES there was evidence of an ulceration versus perforation of the esophagus.  This is certainly the reason for her complaints of odynophagia.  No evidence of esophagitis, ulcerations, erosions, polyps, masses, or Candidal esophagitis.  No evidence of crepitus with palpation of the neck.   Retroflexed views revealed no abnormalities.     The scope was then withdrawn from the patient and the procedure terminated.  COMPLICATIONS: There were no complications. IMPRESSION:  1) Mucosal defect in the cervical esophagus.  ? large ulceration versus perforation.  RECOMMENDATIONS:  1) Esophagram with gastrograffin. 2) Maintain NPO.   _______________________________ Lorrin MaisCarol Ada, MD 08/10/2012 2:41 PM

## 2012-08-10 NOTE — Progress Notes (Signed)
Utilization review completed. Darien Kading, RN, BSN. 

## 2012-08-10 NOTE — Addendum Note (Signed)
Addendum  created 08/10/12 2144 by Warrick Parisian, MD   Modules edited:Anesthesia Events

## 2012-08-10 NOTE — Consult Note (Signed)
CARDIOTHORACIC SURGERY CONSULTATION REPORT  PCP is Purvis Kilts, MD Referring Provider is HUNG, Tory Emerald, MD   Reason for consultation:  Esophageal perforation  HPI:  Patient is an 78 year old recently widowed white female from [place] with multiple medical problems who recently fell and broke her left wrist and left hip on 08/07/2012.  She was taken to the operating room where she underwent ORIF of her distal radial fracture and arthroplasty of her left hip on 08/08/2012 under general anesthesia.  Ever since surgery she has complained of odynophagia. Symptoms persisted for several days, ultimately prompting a GI medicine consult. The patient underwent upper endoscopy earlier today by Dr. Benson Norway and was found to have a mucosal defect in the cervical esophagus suggestive of ulcer versus perforation. Gastrografin contrast study confirmed the presence of what appears to be a contained perforation at the thoracic inlet. Cardiothoracic surgical consultation has been requested.  The patient reports pain with swallowing both liquids and solids that is primarily located lower neck on the right-hand side. The patient denies any hoarseness of voice or coughing with attempts to drink liquids. She denies any fevers or chills. She denies any chest pain. She denies shortness of breath.  Past Medical History  Diagnosis Date  . Hypertension   . Hypercholesteremia   . Myocardial infarction     Inferior STEMI 07/2008 - PCI of prox RCA followed byu CABG x 3 in 9/'09  . Pneumonia   . Anxiety   . CAD in native artery     s/p CABG x 3; Last Myoview in 07/2009 - non-ischemic; Echo 03/2012  Aortic Sclerosis with normal EF.  Marland Kitchen Asthma   . Pacemaker   . GERD (gastroesophageal reflux disease)   . Arthritis     Past Surgical History  Procedure Date  . Coronary artery bypass graft 2009    LIMA-LAD, SVG-RI, SVG-stented RCA  . Total hip arthroplasty   . Pacemaker insertion   . Insert / replace /  remove pacemaker   . Wrist fracture surgery 07/2012    left intra articular  with carpel tunnel  . Carpal tunnel release 08/08/2012    Procedure: CARPAL TUNNEL RELEASE;  Surgeon: Schuyler Amor, MD;  Location: [facility];  Service: Orthopedics;  Laterality: Left;  . Hip arthroplasty 08/08/2012    Procedure: ARTHROPLASTY BIPOLAR HIP;  Surgeon: Rudean Haskell, MD;  Location: [facility];  Service: Orthopedics;  Laterality: Left;  [place]     Family History  Problem Relation Age of Onset  . Hypertension Mother   . Diabetes Mother   . Cancer Mother   . Hypertension Father   . Stroke Father     Social History History  Substance Use Topics  . Smoking status: Never Smoker   . Smokeless tobacco: Never Used  . Alcohol Use: No    Prior to Admission medications   Medication Sig Start Date End Date Taking? Authorizing Provider  acetaminophen (TYLENOL) 500 MG tablet Take 1,000 mg by mouth every 6 (six) hours as needed. Pain   Yes Historical Provider, MD  aspirin 81 MG tablet Take 81 mg by mouth 2 (two) times daily.     Yes Historical Provider, MD  ciprofloxacin (CIPRO) 500 MG tablet Take 500 mg by mouth 2 (two) times daily.   Yes Historical Provider, MD  fish oil-omega-3 fatty acids 1000 MG capsule Take 4 g by mouth daily.   Yes Historical Provider, MD  hydrochlorothiazide (HYDRODIURIL) 25 MG tablet  Take 25 mg by mouth daily.   Yes Historical Provider, MD  metoprolol succinate (TOPROL-XL) 25 MG 24 hr tablet Take 25 mg by mouth daily.   Yes Historical Provider, MD  niacin (NIASPAN) 500 MG CR tablet Take 500 mg by mouth at bedtime.     Yes Historical Provider, MD  omeprazole (PRILOSEC) 20 MG capsule Take 20 mg by mouth daily.     Yes Historical Provider, MD  valsartan (DIOVAN) 160 MG tablet Take 160 mg by mouth daily.     Yes Historical Provider, MD  enoxaparin (LOVENOX) 40 MG/0.4ML injection Inject 0.4 mLs (40 mg total) into the skin every 12 (twelve) hours. 08/10/12   Carlynn Spry, PA    Current  Facility-Administered Medications  Medication Dose Route Frequency Provider Last Rate Last Dose  . bisacodyl (DULCOLAX) suppository 10 mg  10 mg Rectal Once Reyne Dumas, MD   10 mg at 08/10/12 1132  . enoxaparin (LOVENOX) injection 40 mg  40 mg Subcutaneous Q24H Reyne Dumas, MD   40 mg at 08/10/12 1129  . lidocaine (XYLOCAINE) 1 % injection           . lidocaine (XYLOCAINE) 2 % viscous mouth solution 20 mL  20 mL Mouth/Throat Once [name], MD      . metoprolol (LOPRESSOR) injection 2.5 mg  2.5 mg Intravenous Once Reyne Dumas, MD   2.5 mg at 08/10/12 1200  . metoprolol (LOPRESSOR) injection 2.5 mg  2.5 mg Intravenous Q8H Reyne Dumas, MD      . morphine 2 MG/ML injection 2 mg  2 mg Intravenous Q4H PRN Quintella Baton, MD   2 mg at 08/10/12 0042  . piperacillin-tazobactam (ZOSYN) IVPB 3.375 g  3.375 g Intravenous Q8H Arman Bogus, [facility]      . sodium chloride 0.9 % injection 3 mL  3 mL Intravenous Q12H Debby Crosley, MD   3 mL at 08/08/12 1000  . DISCONTD: 0.9 %  sodium chloride infusion   Intravenous Continuous Reyne Dumas, MD 75 mL/hr at 08/10/12 1115 75 mL/hr at 08/10/12 1115  . DISCONTD: 0.9 %  sodium chloride infusion   Intravenous Continuous Beryle Beams, MD      . DISCONTD: acetaminophen (TYLENOL) tablet 1,000 mg  1,000 mg Oral Q6H PRN Quintella Baton, MD      . DISCONTD: alum & mag hydroxide-simeth (MAALOX/MYLANTA) 200-200-20 MG/5ML suspension 30 mL  30 mL Oral Q6H PRN Debby Crosley, MD      . DISCONTD: aspirin EC tablet 81 mg  81 mg Oral Daily Reyne Dumas, MD      . DISCONTD: HYDROmorphone (DILAUDID) injection 0.25-0.5 mg  0.25-0.5 mg Intravenous Q5 min PRN Warrick Parisian, MD   0.5 mg at 08/09/12 0848  . DISCONTD: metoprolol succinate (TOPROL-XL) 24 hr tablet 50 mg  50 mg Oral Daily Debby Crosley, MD   50 mg at 08/08/12 1055  . DISCONTD: midazolam (VERSED) injection    PRN Beryle Beams, MD   1 mg at 08/10/12 1426  . DISCONTD: ondansetron (ZOFRAN) tablet 4 mg  4 mg Oral  Q6H PRN Debby Crosley, MD      . DISCONTD: oxyCODONE (Oxy IR/ROXICODONE) immediate release tablet 5 mg  5 mg Oral Q4H PRN Debby Crosley, MD   5 mg at 08/10/12 0739  . DISCONTD: oxyCODONE (Oxy IR/ROXICODONE) immediate release tablet 5 mg  5 mg Oral TID PRN Reyne Dumas, MD   5 mg at 08/10/12 1323  . DISCONTD: pantoprazole (PROTONIX) EC tablet 40  mg  40 mg Oral Q1200 Debby Crosley, MD   40 mg at 08/10/12 1323  . DISCONTD: senna-docusate (Senokot-S) tablet 1 tablet  1 tablet Oral QHS PRN Debby Crosley, MD      . DISCONTD: traMADol-acetaminophen (ULTRACET) 37.5-325 MG per tablet 1 tablet  1 tablet Oral Q6H PRN Reyne Dumas, MD      . DISCONTD: zolpidem (AMBIEN) tablet 5 mg  5 mg Oral QHS PRN Quintella Baton, MD        Allergies  Allergen Reactions  . Hydrocodone-Acetaminophen Itching    Tolerates tylenol    Review of Systems:  General:  decreased appetite, decreased energy   Respiratory:  + occasional cough, no wheezing, no hemoptysis, no pain with inspiration or cough, no shortness of breath   Cardiac:   no chest pain or tightness, no exertional SOB, no resting SOB, no PND, no orthopnea, no LE edema, no palpitations, no syncope  GI:   + difficulty swallowing, no hematochezia, no hematemesis, no melena, no constipation, no diarrhea   GU:   no dysuria, no urgency, no frequency   Musculoskeletal: Pain in left wrist and left hip  Vascular:  no pain suggestive of claudication   Neuro:   no symptoms suggestive of TIA's, no seizures, no headaches, no peripheral neuropathy   Endocrine:  Negative   HEENT:  no loose teeth or painful teeth,  no recent vision changes  Psych:   Recently widowed   Physical Exam:   BP 121/44  Pulse 109  Temp 97.7 F (36.5 C) (Oral)  Resp 18  Ht 5\' 5"  (1.651 m)  Wt 68.04 kg (150 lb)  BMI 24.96 kg/m2  SpO2 96%  General:  Elderly and frail-appearing  HEENT:  Unremarkable  Neck:   no JVD, no bruits, no adenopathy, + mild tenderness right lower anterior neck, no  palpable fluid collection, no crepitice  Chest:   clear to auscultation, fairly symmetrical breath sounds perhaps slightly diminished R vs L, no wheezes, no rhonchi   CV:   RRR, no  murmur   Abdomen:  soft, non-tender, no masses   Extremities:  warm, well-perfused, pulses not palpable  Rectal/GU  Deferred  Neuro:   Grossly non-focal and symmetrical throughout  Skin:   Clean and dry, no rashes, no breakdown  Diagnostic Tests:  *RADIOLOGY REPORT*  Clinical Data: Odynaphagia with ulceration or perforation of the  esophagus just below the upper esophageal sphincter on endoscopy  today.  ESOPHOGRAM/BARIUM SWALLOW  Technique: Single contrast examination was performed using water  soluble contrast (Omnipaque-300).  Fluoroscopy time: 1.1 minutes.  Comparison: Chest radiographs 08/08/2012. Chest CT 08/15/2008.  Findings: The patient has limited mobility due to a recent left  hip fracture. The study was initiated with the patient in the  right posterior oblique position. The cervical esophagus appears  normal. Just below the thoracic inlet, there is focal  extravasation of contrast into the right superior mediastinum.  With additional swallowing in the supine position, this became  better defined, but appears contained. There is no extravasation  into the pleural space. The distal esophagus was incompletely  evaluated but appears grossly normal. Some contrast enters the  stomach.  IMPRESSION:  Contained perforation of the proximal thoracic esophagus with  extraluminal contrast collection in the superior right mediastinum.  In review of the prior chest CT, there was no evidence of  underlying esophageal diverticulum in this area.  Results have been reviewed with Dr. Benson Norway shortly after the  procedure.  Original Report  Authenticated By: Vivia Ewing, M.D.    Impression:  Likely contained small perforation of the esophagus at the thoracic inlet that most likely represents a  traumatic injury related to intubation at the time of surgery on October 9th.  Review of old chest CT scan performed in 2009 does not reveal any findings suggestive that the patient may have a Zenker's diverticulum, and the injury identified by endoscopy earlier today he is clearly most consistent with traumatic injury from intubation during surgery. The patient appears clinically stable with no palpable crepitus nor fluid collection on exam of the base of the neck.  She has no fevers and normal white blood count.  I am hopeful that the patient will not require surgical intervention. She should be formally evaluated by otolaryngology.  Plan:  Will ask for ENT consult and obtain CT scan of the neck and chest to rule out significant fluid collection in the neck and/or mediastinum which might need surgical drainage. The patient should be kept strict NPO indefinitely.  Will start empiric IV antibiotics.  I favor aggressive nutritional support and would suggest placement of PICC line and beginning TNA.  Depending upon the patient's progress and anticipated duration for recovery she may ultimately need to be converted to tube feedings.      Valentina Gu. Roxy Manns, MD 08/10/2012 5:47 PM ;

## 2012-08-10 NOTE — Consult Note (Signed)
Hickel,  [place] 78 y.o., female [id number]     Chief Complaint: Esophageal perforation  HPI: 78 year old white female fell 3 days ago, sustaining a fracture of her left hip and left wrist. She does not know whether she lost consciousness. She underwent general anesthesia 2 days ago for repair of both injuries. Postop, she reported pain and difficulty swallowing. This was initially felt to be related to the, the intubation of no significant consequence. Speech pathology was involved and did not see any specific issues. With persistent difficulty, Dr. Benson Norway, gastroenterologist, was consulted. He saw her earlier today.  Flexible endoscopy revealed an ulcer versus a tear on the right side distal to the cricopharyngeus. No other abnormalities encountered. A Gastrografin swallow done earlier today showed a tear with extravasation, apparently well contained, in the right superior mediastinum.  Thoracic surgery was consulted. She was made n.p.o., placed on intravenous antibiotics, and CT neck and chest were ordered. These are pending. ENT was called for further assistance.  White blood cell count has been coming down in the hospital. She is afebrile. Albumin is good. TSH is good. She has a history of reflux but is on omeprazole. I could not find record of prealbumin.  Prior to the surgery, she was swallowing full regular diet without difficulty.  She is not presently having chest pain. Vital signs do not suggest toxicity.  PMH: Past Medical History  Diagnosis Date  . Hypertension   . Hypercholesteremia   . Myocardial infarction     Inferior STEMI 07/2008 - PCI of prox RCA followed byu CABG x 3 in 9/'09  . Pneumonia   . Anxiety   . CAD in native artery     s/p CABG x 3; Last Myoview in 07/2009 - non-ischemic; Echo 03/2012  Aortic Sclerosis with normal EF.  Marland Kitchen Asthma   . Pacemaker   . GERD (gastroesophageal reflux disease)   . Arthritis     Surg Hx: Past Surgical History  Procedure Date  . Coronary  artery bypass graft 2009    LIMA-LAD, SVG-RI, SVG-stented RCA  . Total hip arthroplasty   . Pacemaker insertion   . Insert / replace / remove pacemaker   . Wrist fracture surgery 07/2012    left intra articular  with carpel tunnel  . Carpal tunnel release 08/08/2012    Procedure: CARPAL TUNNEL RELEASE;  Surgeon: Schuyler Amor, MD;  Location: [facility];  Service: Orthopedics;  Laterality: Left;  . Hip arthroplasty 08/08/2012    Procedure: ARTHROPLASTY BIPOLAR HIP;  Surgeon: Rudean Haskell, MD;  Location: [facility];  Service: Orthopedics;  Laterality: Left;  [place]     FHx:   Family History  Problem Relation Age of Onset  . Hypertension Mother   . Diabetes Mother   . Cancer Mother   . Hypertension Father   . Stroke Father    SocHx:  reports that she has never smoked. She has never used smokeless tobacco. She reports that she does not drink alcohol or use illicit drugs.  ALLERGIES:  Allergies  Allergen Reactions  . Hydrocodone-Acetaminophen Itching    Tolerates tylenol    Medications Prior to Admission  Medication Sig Dispense Refill  . acetaminophen (TYLENOL) 500 MG tablet Take 1,000 mg by mouth every 6 (six) hours as needed. Pain      . aspirin 81 MG tablet Take 81 mg by mouth 2 (two) times daily.        . ciprofloxacin (CIPRO) 500 MG tablet Take 500 mg by  mouth 2 (two) times daily.      . fish oil-omega-3 fatty acids 1000 MG capsule Take 4 g by mouth daily.      . hydrochlorothiazide (HYDRODIURIL) 25 MG tablet Take 25 mg by mouth daily.      . metoprolol succinate (TOPROL-XL) 25 MG 24 hr tablet Take 25 mg by mouth daily.      . niacin (NIASPAN) 500 MG CR tablet Take 500 mg by mouth at bedtime.        Marland Kitchen omeprazole (PRILOSEC) 20 MG capsule Take 20 mg by mouth daily.        . valsartan (DIOVAN) 160 MG tablet Take 160 mg by mouth daily.          Results for orders placed during the hospital encounter of 08/07/12 (from the past 48 hour(s))  BASIC METABOLIC PANEL     Status:  Abnormal   Collection Time   08/09/12  5:20 AM      Component Value Range Comment   Sodium 134 (*) 135 - 145 mEq/L    Potassium 4.8  3.5 - 5.1 mEq/L    Chloride 99  96 - 112 mEq/L    CO2 26  19 - 32 mEq/L    Glucose, Bld 140 (*) 70 - 99 mg/dL    BUN 24 (*) 6 - 23 mg/dL    Creatinine, Ser 1.50 (*) 0.50 - 1.10 mg/dL    Calcium 8.7  8.4 - 10.5 mg/dL    GFR calc non Af Amer 30 (*) >90 mL/min    GFR calc Af Amer 35 (*) >90 mL/min   CBC     Status: Abnormal   Collection Time   08/09/12  5:20 AM      Component Value Range Comment   WBC 14.1 (*) 4.0 - 10.5 K/uL    RBC 3.31 (*) 3.87 - 5.11 MIL/uL    Hemoglobin 9.9 (*) 12.0 - 15.0 g/dL    HCT 29.8 (*) 36.0 - 46.0 %    MCV 90.0  78.0 - 100.0 fL    MCH 29.9  26.0 - 34.0 pg    MCHC 33.2  30.0 - 36.0 g/dL    RDW 13.4  11.5 - 15.5 %    Platelets 161  150 - 400 K/uL   URINALYSIS, ROUTINE W REFLEX MICROSCOPIC     Status: Abnormal   Collection Time   08/09/12 12:06 PM      Component Value Range Comment   Color, Urine YELLOW  YELLOW    APPearance CLEAR  CLEAR    Specific Gravity, Urine 1.018  1.005 - 1.030    pH 5.0  5.0 - 8.0    Glucose, UA NEGATIVE  NEGATIVE mg/dL    Hgb urine dipstick NEGATIVE  NEGATIVE    Bilirubin Urine NEGATIVE  NEGATIVE    Ketones, ur NEGATIVE  NEGATIVE mg/dL    Protein, ur NEGATIVE  NEGATIVE mg/dL    Urobilinogen, UA 0.2  0.0 - 1.0 mg/dL    Nitrite NEGATIVE  NEGATIVE    Leukocytes, UA TRACE (*) NEGATIVE   URINE MICROSCOPIC-ADD ON     Status: Abnormal   Collection Time   08/09/12 12:06 PM      Component Value Range Comment   Squamous Epithelial / LPF RARE  RARE    WBC, UA 3-6  <3 WBC/hpf CHECKED   RBC / HPF 0-2  <3 RBC/hpf    Bacteria, UA RARE  RARE    Casts HYALINE CASTS (*)  NEGATIVE    Urine-Other MUCOUS PRESENT     BASIC METABOLIC PANEL     Status: Abnormal   Collection Time   08/10/12  6:00 AM      Component Value Range Comment   Sodium 136  135 - 145 mEq/L    Potassium 4.3  3.5 - 5.1 mEq/L     Chloride 101  96 - 112 mEq/L    CO2 24  19 - 32 mEq/L    Glucose, Bld 107 (*) 70 - 99 mg/dL    BUN 26 (*) 6 - 23 mg/dL    Creatinine, Ser 1.18 (*) 0.50 - 1.10 mg/dL    Calcium 8.9  8.4 - 10.5 mg/dL    GFR calc non Af Amer 40 (*) >90 mL/min    GFR calc Af Amer 46 (*) >90 mL/min   CBC     Status: Abnormal   Collection Time   08/10/12  6:00 AM      Component Value Range Comment   WBC 11.7 (*) 4.0 - 10.5 K/uL    RBC 2.90 (*) 3.87 - 5.11 MIL/uL    Hemoglobin 8.6 (*) 12.0 - 15.0 g/dL    HCT 26.2 (*) 36.0 - 46.0 %    MCV 90.3  78.0 - 100.0 fL    MCH 29.7  26.0 - 34.0 pg    MCHC 32.8  30.0 - 36.0 g/dL    RDW 13.5  11.5 - 15.5 %    Platelets 143 (*) 150 - 400 K/uL    Dg Foot Complete Right  08/09/2012  *RADIOLOGY REPORT*  Clinical Data: Right foot pain.  Postop surgery for fracture left hip and left radial fracture.  RIGHT FOOT COMPLETE - 3+ VIEW  Comparison: Radiographs 08/06/2011.  Findings: There is motion on the AP view.  Mild osteopenia is noted.  The previously noted fracture involving the base of the fifth metatarsal has healed.  There is no evidence of acute fracture or dislocation.  There are stable degenerative changes of the first metatarsal phalangeal joint.  The tibial sesamoid of the first metatarsal is bipartite.  IMPRESSION: No acute osseous findings.   Original Report Authenticated By: Vivia Ewing, M.D.    Dg Esophagus W/water Sol Cm  08/10/2012  *RADIOLOGY REPORT*  Clinical Data: Elenora Gamma with ulceration or perforation of the esophagus just below the upper esophageal sphincter on endoscopy today.  ESOPHOGRAM/BARIUM SWALLOW  Technique:  Single contrast examination was performed using water soluble contrast (Omnipaque-300).  Fluoroscopy time:  1.1 minutes.  Comparison:  Chest radiographs 08/08/2012.  Chest CT 08/15/2008.  Findings:  The patient has limited mobility due to a recent left hip fracture.  The study was initiated with the patient in the right posterior oblique  position. The cervical esophagus appears normal.  Just below the thoracic inlet, there is focal extravasation of contrast into the right superior mediastinum. With additional swallowing in the supine position, this became better defined, but appears contained.  There is no extravasation into the pleural space.  The distal esophagus was incompletely evaluated but appears grossly normal.  Some contrast enters the stomach.  IMPRESSION: Contained perforation of the proximal thoracic esophagus with extraluminal contrast collection in the superior right mediastinum. In review of the prior chest CT, there was no evidence of underlying esophageal diverticulum in this area.  Results have been reviewed with Dr. Benson Norway shortly after the procedure.   Original Report Authenticated By: Vivia Ewing, M.D.      Blood pressure 121/44,  pulse 109, temperature 97.7 F (36.5 C), temperature source Oral, resp. rate 18, height 5\' 5"  (1.651 m), weight 68.04 kg (150 lb), SpO2 96.00%.  PHYSICAL EXAM: She is older and somewhat frail appearing. Mental status seems appropriate. Voice is clear and respirations unlabored through the nose. The head is atraumatic and neck supple. Cranial nerves grossly intact. Ear canals are clear with normal aerated drum. Anterior nose shows some subtle corrugation with drying consistent with nasal cannula O2. Oral cavity is somewhat dry with a good repair. Oropharynx clear. Neck without adenopathy. She may have very slight supraclavicular subcutaneous emphysema on both sides.  Studies Reviewed:  Gastrografin swallow.    Assessment/Plan Esophageal perforation in the upper cervical esophagus, probably secondary to intubation trauma.  Apparent contained extravasation.  I agree with intravenous antibiosis and feeding. Antireflux measures. We will review the CT scans when they're performed. Follow vital signs and white blood cell count for any evidence of mediastinitis. If she is doing nicely, I  would consider for a repeat Gastrografin swallow in 5 days. If that is clear, then I would follow it with a barium swallow for greater resolution. Resume liquid diet when the tear is clearly healed.  [name] [id number], 7:45 PM

## 2012-08-10 NOTE — Progress Notes (Addendum)
[facility] PROGRESS NOTE  Amy [name] [id number] DOB: [birth year] DOA: 08/07/2012 PCP: Purvis Kilts, MD  Assessment/Plan: Principal Problem:  *Pre-operative clearance Active Problems:  Radial head fracture, closed, Lt  Hypertension  Hyperlipidemia, statin intol.  CAD, CABG X 3 10/09. Myoview low risk 10/10. EF >55% 2D 6/13.  Fracture of left hip after fall at home.  Aortic valve sclerosis, no stenosis  Carotid bruit present bilat, dopplers OK 6/13  UTI, just finnished 10 days of Cipro  Hx of dizziness, ? orthostatic in setting of UTI ? mild dehydration  Chronic renal insufficiency, stage III (moderate), SCr 1.4 Sept 2013  PAF/SSS  Pacemaker March 2010 (MDT)  DVT after previous hip surgery  Dysphagia, new post op    Radial fracture/impacted hip fracture,, given extensive history including underlying coronary artery disease, status post CABG, pacemaker placement, CAD, CABG X 3 10/09. Myoview low risk 10/10. EF >55% 2D 6/13  cardiology clearance obtained, status post Open reduction and internal fixation, distal radius fracture  left distal radius, left carpal tunnel release. Awaiting SNF placement   Dysphagia, per speech therapy consultation OK to her regular diet with thin liquids, she start regular diet continue PPI because of history of GERD , patient says liquids are getting stuck , still can't swallow, GI consult called to dr Collene Mares  Right ankle pain x-rays done yesterday did not show any obvious fracture   Paroxysmal A. fib now resolved, will remain high risk for postoperative atrial fibrillation, continue perioperative beta blocker , rate controlled   Hypertension holding ARB and hydrochlorothiazide, due to mild renal insufficiency , postoperative hypotension,    Recent UTI status post treatment with ciprofloxacin    Coronary artery disease we'll cycle cardiac enzymes, history of Inferior STEMI 07/2008 - PCI of prox RCA followed byu CABG x 3 in 9/'09 ,  continue aspirin    Acute blood loss anemia hemoglobin has dropped to 8.9, will transfuse if dropped below 7 without any active bleeding    Dyslipidemia on niacin and fish which will be continued    Chronic kidney disease, stage III, creatinine at baseline, improving everyday    Code Status: full  Family Communication: family updated about patient's clinical progress  Disposition Plan: SNF when bed available      Brief narrative:  79 year old female who lives alone, who had a mechanical fall while going up the steps at her front of her home. She states she did not experience any loss of consciousness. She denies any chest pain, palpitations. At that time she denied lightheadedness however, her daughter states her blood pressures been running in the low 100s recently and the patient is also been having some dizziness. The patient is also been having some inner ear issues and has been on Antivert as needed.  The patient's last major surgery with a CABG 4 years ago along with a postoperative pacemaker. She states she does not get chest pains, no history of CHF. She does have some chronic shortness of breath for years which remains unchanged.  Enroute in the ambulance she did have a episode off A. fib with RVR heart rate to 120. She spontaneously reverted to sinus rhythm. Patient transferred from [facility]  Consultants:  Orthopedics  Cardiology    HPI/Subjective:  Difficulty swallowing, but improved  Objective: Filed Vitals:   08/09/12 0454 08/09/12 1052 08/09/12 1400 08/10/12 0600  BP: 92/40 105/46 100/33 111/43  Pulse:  70 73 94  Temp:   98.5 F (36.9 C) 98 F (  36.7 C)  TempSrc:      Resp:   16 18  Weight:      SpO2:   97% 97%    Intake/Output Summary (Last 24 hours) at 08/10/12 0847 Last data filed at 08/09/12 1500  Gross per 24 hour  Intake      0 ml  Output    325 ml  Net   -325 ml    Exam:  Head: Atraumatic.  Nose: Nose normal.  Mouth/Throat: Oropharynx is  clear and moist.  Eyes: Conjunctivae are normal. Pupils are equal, round, and reactive to light. No scleral icterus.  Neck: Neck supple. No tracheal deviation present.  Cardiovascular: Normal rate, regular rhythm, normal heart sounds and intact distal pulses.  Pulmonary/Chest: Effort normal and breath sounds normal. No respiratory distress.  Abdominal: Soft. Normal appearance and bowel sounds are normal. She exhibits no distension. There is no tenderness.  Left wrist: She exhibits tenderness, bony tenderness and deformity.  Neurological: She is alert and oriented to person, place, and time.  Psychiatric: She has a normal mood and affect. Her behavior is normal. Judgment and thought content normal    Data Reviewed: Basic Metabolic Panel:  Lab [id number] 0600 08/09/12 0520 08/08/12 0600 08/07/12 1141  NA 136 134* 134* 136  K 4.3 4.8 4.3 3.8  CL 101 99 99 98  CO2 24 26 25 29   GLUCOSE 107* 140* 96 102*  BUN 26* 24* 23 24*  CREATININE 1.18* 1.50* 1.32* 1.45*  CALCIUM 8.9 8.7 9.6 10.2  MG -- -- -- --  PHOS -- -- -- --    Liver Function Tests:  Lab 08/07/12 1141  AST 33  ALT 21  ALKPHOS 87  BILITOT 0.3  PROT 6.9  ALBUMIN 3.7   No results found for this basename: LIPASE:5,AMYLASE:5 in the last 168 hours No results found for this basename: AMMONIA:5 in the last 168 hours  CBC:  Lab 08/10/12 0600 08/09/12 0520 08/08/12 0600 08/07/12 1141  WBC 11.7* 14.1* 7.0 5.9  NEUTROABS -- -- -- 4.2  HGB 8.6* 9.9* 11.5* 13.7  HCT 26.2* 29.8* 34.3* 40.2  MCV 90.3 90.0 89.1 88.7  PLT 143* 161 166 182    Cardiac Enzymes: No results found for this basename: CKTOTAL:5,CKMB:5,CKMBINDEX:5,TROPONINI:5 in the last 168 hours BNP (last 3 results) No results found for this basename: PROBNP:3 in the last 8760 hours   CBG: No results found for this basename: GLUCAP:5 in the last 168 hours  Recent Results (from the past 240 hour(s))  URINE CULTURE     Status: Normal   Collection Time   08/07/12  11:24 AM      Component Value Range Status Comment   Specimen Description URINE, CATHETERIZED   Final    Special Requests NONE   Final    Culture  Setup Time 08/07/2012 16:50   Final    Colony Count NO GROWTH   Final    Culture NO GROWTH   Final    Report Status 08/08/2012 FINAL   Final   SURGICAL PCR SCREEN     Status: Normal   Collection Time   08/08/12  5:32 AM      Component Value Range Status Comment   MRSA, PCR NEGATIVE  NEGATIVE Final    Staphylococcus aureus NEGATIVE  NEGATIVE Final      Studies: Dg Chest 1 View  08/07/2012  *RADIOLOGY REPORT*  Clinical Data: Fall  CHEST - 1 VIEW  Comparison: 03/25/2012  Findings: Left subclavian sequential transvenous  pacemaker leads project at right atrium and right ventricle. Upper normal heart size post CABG. Calcified tortuous aorta. Pulmonary vascularity normal. Lungs grossly clear. No pleural effusion or pneumothorax. Minimal biapical scarring. Diffuse osseous demineralization.  IMPRESSION: Post CABG and pacemaker. No acute abnormalities.   Original Report Authenticated By: Burnetta Sabin, M.D.    Dg Wrist Complete Left  08/07/2012  *RADIOLOGY REPORT*  Clinical Data: Left wrist pain and swelling post fall  LEFT WRIST - COMPLETE 3+ VIEW  Comparison: 03/07/2005  Findings: Minimally displaced ulnar styloid fracture. Comminuted distal left radial metaphyseal fracture with intra- articular extension at the radiocarpal joint. Question extension into the distal radioulnar joint as well. Dorsal tilt of the distal radial articular surface. Overlying soft tissue swelling. Minimal degenerative changes at first [facility] joint. No additional acute fracture or dislocation identified. Osseous demineralization.  IMPRESSION: Ulnar styloid fracture. Angulated mildly displaced intra-articular distal left radial metaphyseal fracture. Osseous demineralization.   Original Report Authenticated By: Burnetta Sabin, M.D.    Dg Hip Complete Left  08/07/2012  *RADIOLOGY REPORT*   Clinical Data: Left hip pain post fall  LEFT HIP - COMPLETE 2+ VIEW  Comparison: None  Findings: Minimally impacted left femoral neck fracture. Osseous mineralization appears diffusely decreased. Left hip joint space preserved. No additional fracture or dislocation identified. Symmetric SI joints. Pelvis appears intact.  IMPRESSION: Minimally impacted left femoral neck fracture.   Original Report Authenticated By: Burnetta Sabin, M.D.    Ct Head Wo Contrast  08/07/2012  *RADIOLOGY REPORT*  Clinical Data:  Fall.  CT HEAD WITHOUT CONTRAST CT CERVICAL SPINE WITHOUT CONTRAST  Technique:  Multidetector CT imaging of the head and cervical spine was performed following the standard protocol without intravenous contrast.  Multiplanar CT image reconstructions of the cervical spine were also generated.  Comparison:   None  CT HEAD  Findings: No skull fracture or intracranial hemorrhage.  No CT evidence of large acute infarct.  Mild small vessel disease type changes.  No hydrocephalus.  No intracranial mass lesion detected on this unenhanced exam.  Vascular calcifications.  IMPRESSION: No skull fracture or intracranial hemorrhage.  CT CERVICAL SPINE  Findings: No cervical spine fracture.  Dextroscoliosis and degenerative changes.  Spinal stenosis most notable C4-5.  Biapical lung scarring.  Carotid calcifications.  IMPRESSION: No cervical spine fracture.  Cervical spondylotic changes with spinal stenosis most notable C4- 5.   Original Report Authenticated By: Doug Sou, M.D.    Ct Cervical Spine Wo Contrast  08/07/2012  *RADIOLOGY REPORT*  Clinical Data:  Fall.  CT HEAD WITHOUT CONTRAST CT CERVICAL SPINE WITHOUT CONTRAST  Technique:  Multidetector CT imaging of the head and cervical spine was performed following the standard protocol without intravenous contrast.  Multiplanar CT image reconstructions of the cervical spine were also generated.  Comparison:   None  CT HEAD  Findings: No skull fracture or intracranial  hemorrhage.  No CT evidence of large acute infarct.  Mild small vessel disease type changes.  No hydrocephalus.  No intracranial mass lesion detected on this unenhanced exam.  Vascular calcifications.  IMPRESSION: No skull fracture or intracranial hemorrhage.  CT CERVICAL SPINE  Findings: No cervical spine fracture.  Dextroscoliosis and degenerative changes.  Spinal stenosis most notable C4-5.  Biapical lung scarring.  Carotid calcifications.  IMPRESSION: No cervical spine fracture.  Cervical spondylotic changes with spinal stenosis most notable C4- 5.   Original Report Authenticated By: Doug Sou, M.D.    Dg Chest [facility]  08/08/2012  *RADIOLOGY REPORT*  Clinical Data: Preoperative study.  Shortness of breath.  PORTABLE CHEST - 1 VIEW  Comparison: Chest x-ray 08/07/2012.  Findings: The lung volumes are normal.  No consolidative airspace disease.  No definite pleural effusions.  Bilateral apical pleuroparenchymal thickening, similar to prior examinations, most compatible with scarring.  Prominence of the central pulmonary arteries could suggest pulmonary arterial hypertension. Mild pulmonary venous congestion without frank pulmonary edema.  Heart size is mildly enlarged (unchanged). The patient is rotated to the right on today's exam, resulting in distortion of the mediastinal contours and reduced diagnostic sensitivity and specificity for mediastinal pathology.  Atherosclerotic calcifications are noted within the arch of the aorta.  Left-sided pacemaker device in place with lead tips projecting over the expected location of the right atrium and right ventricular apex.  IMPRESSION: 1.  Cardiomegaly with mild pulmonary venous congestion, but no frank pulmonary edema. 2.  In addition, there is dilatation of the central pulmonary arteries, which could suggest pulmonary arterial hypertension. 3.  Atherosclerosis. 4.  Postoperative changes and support apparatus, as above.   Original Report Authenticated By:  Etheleen Mayhew, M.D.    Dg Shoulder Left  08/08/2012  *RADIOLOGY REPORT*  Clinical Data: Pain.  Recent injury.  LEFT SHOULDER - 2+ VIEW  Comparison: None.  Findings: No evidence of fracture or dislocation.  No significant degenerative change of the glenohumeral joint.  Ordinary osteoarthritis of the AC joint.  Pacemaker overlies the region. There are old appearing left rib fractures, not primarily evaluated.  IMPRESSION: No evidence of fracture or dislocation.  Osteoarthritis of the [facility] joint.   Original Report Authenticated By: Jules Schick, M.D.    Dg Foot Complete Right  08/09/2012  *RADIOLOGY REPORT*  Clinical Data: Right foot pain.  Postop surgery for fracture left hip and left radial fracture.  RIGHT FOOT COMPLETE - 3+ VIEW  Comparison: Radiographs 08/06/2011.  Findings: There is motion on the AP view.  Mild osteopenia is noted.  The previously noted fracture involving the base of the fifth metatarsal has healed.  There is no evidence of acute fracture or dislocation.  There are stable degenerative changes of the first metatarsal phalangeal joint.  The tibial sesamoid of the first metatarsal is bipartite.  IMPRESSION: No acute osseous findings.   Original Report Authenticated By: Vivia Ewing, M.D.     Scheduled Meds:   . enoxaparin  40 mg Subcutaneous Q24H  . metoprolol succinate  50 mg Oral Daily  . pantoprazole  40 mg Oral Q1200  . sodium chloride  3 mL Intravenous Q12H  . DISCONTD: enoxaparin (LOVENOX) injection  30 mg Subcutaneous Daily   Continuous Infusions:   . sodium chloride 100 mL/hr (08/09/12 0914)    Principal Problem:  *Pre-operative clearance Active Problems:  Radial head fracture, closed, Lt  Hypertension  Hyperlipidemia, statin intol.  CAD, CABG X 3 10/09. Myoview low risk 10/10. EF >55% 2D 6/13.  Fracture of left hip after fall at home.  Aortic valve sclerosis, no stenosis  Carotid bruit present bilat, dopplers OK 6/13  UTI, just finnished 10 days of  Cipro  Hx of dizziness, ? orthostatic in setting of UTI ? mild dehydration  Chronic renal insufficiency, stage III (moderate), SCr 1.4 Sept 2013  PAF/SSS  Pacemaker March 2010 (MDT)  DVT after previous hip surgery  Dysphagia, new post op    Time spent: 40 minutes   [facility] Pager [phone]. If 8PM-8AM, please contact night-coverage at [email or web address], password  [email or web address] 08/10/2012, 8:47 AM  LOS: 3 days

## 2012-08-10 NOTE — Clinical Social Work Placement (Signed)
     Clinical Social Work Department CLINICAL SOCIAL WORK PLACEMENT NOTE 08/10/2012  Patient:  Amy [name], Amy [name]  Account Number:  [id number] [place] date:  08/07/2012  Clinical Social Worker:  Butch Penny Olivianna Higley, LCSWA  Date/time:  08/10/2012 02:42 PM  Clinical Social Work is seeking post-discharge placement for this patient at the following level of care:   SKILLED NURSING   (*CSW will update this form in Epic as items are completed)   08/10/2012  Patient/family provided with [place] Department of Clinical Social Works list of facilities offering this level of care within the geographic area requested by the patient (or if unable, by the patients family).  08/10/2012  Patient/family informed of their freedom to choose among providers that offer the needed level of care, that participate in Medicare, Medicaid or managed care program needed by the patient, have an available bed and are willing to accept the patient.  08/10/2012  Patient/family informed of [facility] ownership interest in [facility], as well as of the fact that they are under no obligation to receive care at this facility.  PASARR submitted to EDS on  PASARR number received from [place] on   FL2 transmitted to all facilities in geographic area requested by pt/family on  08/10/2012 FL2 transmitted to all facilities within larger geographic area on   Patient informed that his/her managed care company has contracts with or will negotiate with  certain facilities, including the following:   NA   Pt has existing PASARR     Patient/family informed of bed offers received:  08/10/2012 Patient chooses bed at [facility] Physician recommends and patient chooses bed at    Patient to be transferred to [facility] on  08/11/2012 Patient to be transferred to facility by Ambulance  Corey Harold)  The following physician request were entered in Epic:   Additional Comments:

## 2012-08-10 NOTE — Progress Notes (Signed)
OT Cancellation Note  Treatment cancelled today due to:  Pt currently off the floor for procedure. Ot to follow acutely and check back at later date/ time.    (currently listed as : endo department per epic)   Veneda Melter   OTR/L Pager: [phone] Office: [phone] .

## 2012-08-10 NOTE — Progress Notes (Signed)
Esophageal perforation Strict n[po PICC , TNA IN AM START ZOSYN

## 2012-08-10 NOTE — Anesthesia Preprocedure Evaluation (Addendum)
Anesthesia Evaluation  Patient identified by MRN, date of birth, ID band Patient awake    Reviewed: Allergy & Precautions, H&P , NPO status , Patient's Chart, lab work & pertinent test results, reviewed documented beta blocker date and time   Airway Mallampati: II TM Distance: >3 FB Neck ROM: full    Dental  (+) Teeth Intact   Pulmonary shortness of breath, asthma ,  breath sounds clear to auscultation        Cardiovascular Exercise Tolerance: Poor hypertension, Pt. on home beta blockers and Pt. on medications + CAD, + Past MI and + CABG + dysrhythmias Atrial Fibrillation + pacemaker Rhythm:regular Rate:Tachycardia     Neuro/Psych    GI/Hepatic GERD-  Medicated,  Endo/Other    Renal/GU Renal InsufficiencyRenal disease     Musculoskeletal   Abdominal   Peds  Hematology   Anesthesia Other Findings   Reproductive/Obstetrics                         Anesthesia Physical Anesthesia Plan  ASA: III and Emergent  Anesthesia Plan: General   Post-op Pain Management:    Induction: Intravenous  Airway Management Planned: Oral ETT and Video Laryngoscope Planned  Additional Equipment:   Intra-op Plan:   Post-operative Plan: Extubation in OR and Possible Post-op intubation/ventilation  Informed Consent: I have reviewed the patients History and Physical, chart, labs and discussed the procedure including the risks, benefits and alternatives for the proposed anesthesia with the patient or authorized representative who has indicated his/her understanding and acceptance.   Dental advisory given  Plan Discussed with: Surgeon and CRNA  Anesthesia Plan Comments:        Anesthesia Quick Evaluation

## 2012-08-10 NOTE — Op Note (Signed)
08/10/2012  11:10 PM    Amy [name], Amy [name]  MO:[id number]   Pre-Op Dx:  Esophageal perforation. Mediastinitis.  Post-op Dx: Same  Proc: Incision and drainage, right neck and mediastinum, transcervical approach. Cervical esophagoscopy.   Surg:  [name] T MD  Co-surgeon: Dr. Roxy Manns, cardiothoracic   Anes:  GOT  EBL:  Minimal  Comp:  None  Findings:   A vertical 1.5 cm rent in the midline the cervical esophagus just below the cricopharyngeus with frank pus.  A pocket in the mediastinum containing clear fluid, blood, and some cloudy pus. Cultures sent for aerobic and anaerobic bacteria and stat Gram stain.   Procedure: With the patient in a comfortable supine position, general orotracheal anesthesia was induced without difficulty. At an appropriate level, a rubber tooth guard was applied and the cervical esophagoscope was lubricated and introduced and passed easily to the cricopharyngeus. The esophageal tear was identified as above. The esophagoscope was advanced to its full length with other significant findings. It was our intention to place a Silastic feeding tube. However, the hospital does not have any appropriate tubes.  The head was rotated to the left for access to the right neck. A sterile preparation and draping was accomplished. Using a pre-existing skin wrinkle, a 6 cm incision was sharply executed and carried down through skin and platysma muscle. A superior subplatysmal flap was raised. Working in the anterior border of the sternocleidomastoid muscle, the tracheoesophageal groove was identified. Taking care to distract the carotid artery laterally and the esophagus medially, the dissection was carried down towards the prevertebral space. This was entered and finger dissection superiorly and inferiorly into the mediastinum was accomplished. A fluid collection in the mediastinum was identified as above and cultured. All loculations were carefully broken down. Under direct vision,  the esophageal laceration could not be identified from the cervical approach. The wound was irrigated with approximately 500 cc of sterile saline. 1/2 inch Penrose drains were placed superiorly behind the esophagus and inferiorly into the mediastinum and secured to the skin edges with 3-0 nylon. The wound was minimally closed with 3-0 nylon. A four-inch Kerlix absorbent wrap was placed.  Returning to the pharynx, the cervical esophagoscope was introduced once again. Attempts were made to pass a [place] nasogastric tube unsuccessfully. A 14 French tube was successfully passed and ascertained in the stomach by noting greenish liquid, and also auscultation of an air bolus. Through the cervical esophagoscope, the tube was observed to be in the esophageal lumen and no further instrumentation was necessary. The esophagoscope and tooth guard were removed. Patient was returned anesthesia, awakened, extubated, and transferred to recovery in stable condition.  Dispo:   PACU to 2300   Plan:  Dressing changes. Intravenous antibiotics. Nasogastric feeding. Analgesia. It may take some time for the esophageal tear to close. When the drainage is minimizing, we will repeat a Gastrografin swallow and then consider for possible advancement of the drains.   Tyson Alias MD

## 2012-08-10 NOTE — Progress Notes (Signed)
ANTIBIOTIC CONSULT NOTE - INITIAL  Pharmacy Consult for Zosyn Indication:  Empiric antibiotic for small perforation of the esophagus    Allergies  Allergen Reactions  . Hydrocodone-Acetaminophen Itching    Tolerates tylenol    Patient Measurements: Height: 5\' 5"  (165.1 cm) Weight: 150 lb (68.04 kg) IBW/kg (Calculated) : 57    Vital Signs: Temp: 97.7 F (36.5 C) (10/11 1415) Temp src: Oral (10/11 1341) BP: 121/44 mmHg (10/11 1450) Pulse Rate: 109  (10/11 1415) Intake/Output from previous day: 10/10 0701 - 10/11 0700 In: 60 [P.O.:60] Out: 325 [Urine:325] Intake/Output from this shift:    Labs:  Basename 08/10/12 0600 08/09/12 0520 08/08/12 0600  WBC 11.7* 14.1* 7.0  HGB 8.6* 9.9* 11.5*  PLT 143* 161 166  LABCREA -- -- --  CREATININE 1.18* 1.50* 1.32*   Estimated Creatinine Clearance: 29.7 ml/min (by C-G formula based on Cr of 1.18). No results found for this basename: VANCOTROUGH:2,VANCOPEAK:2,VANCORANDOM:2,GENTTROUGH:2,GENTPEAK:2,GENTRANDOM:2,TOBRATROUGH:2,TOBRAPEAK:2,TOBRARND:2,AMIKACINPEAK:2,AMIKACINTROU:2,AMIKACIN:2, in the last 72 hours   Microbiology: Recent Results (from the past 720 hour(s))  URINE CULTURE     Status: Normal   Collection Time   08/07/12 11:24 AM      Component Value Range Status Comment   Specimen Description URINE, CATHETERIZED   Final    Special Requests NONE   Final    Culture  Setup Time 08/07/2012 16:50   Final    Colony Count NO GROWTH   Final    Culture NO GROWTH   Final    Report Status 08/08/2012 FINAL   Final   SURGICAL PCR SCREEN     Status: Normal   Collection Time   08/08/12  5:32 AM      Component Value Range Status Comment   MRSA, PCR NEGATIVE  NEGATIVE Final    Staphylococcus aureus NEGATIVE  NEGATIVE Final     Medical History: Past Medical History  Diagnosis Date  . Hypertension   . Hypercholesteremia   . Myocardial infarction     Inferior STEMI 07/2008 - PCI of prox RCA followed byu CABG x 3 in 9/'09  .  Pneumonia   . Anxiety   . CAD in native artery     s/p CABG x 3; Last Myoview in 07/2009 - non-ischemic; Echo 03/2012  Aortic Sclerosis with normal EF.  Marland Kitchen Asthma   . Pacemaker   . GERD (gastroesophageal reflux disease)   . Arthritis       Assessment: 78 y.o female with small perforation of the esophagus at the thoracic inlet likely represents a traumatic injury related to intubation at surgery on 08/08/12.  Scr = 1.19, CrCl ~ 29 ml/min.   Plan:  Zosyn 3.375 g IV q8hr  (infuse each dose over 4 hours)  Nicole Cella, RPh Clinical Pharmacist Pager: [phone] 08/10/2012,8:35 PM

## 2012-08-11 ENCOUNTER — Encounter (HOSPITAL_COMMUNITY): Payer: Self-pay | Admitting: Physician Assistant

## 2012-08-11 ENCOUNTER — Inpatient Hospital Stay (HOSPITAL_COMMUNITY): Payer: Medicare Other

## 2012-08-11 DIAGNOSIS — J9851 Mediastinitis: Secondary | ICD-10-CM | POA: Diagnosis present

## 2012-08-11 DIAGNOSIS — K223 Perforation of esophagus: Secondary | ICD-10-CM

## 2012-08-11 DIAGNOSIS — Z96649 Presence of unspecified artificial hip joint: Secondary | ICD-10-CM | POA: Insufficient documentation

## 2012-08-11 LAB — COMPREHENSIVE METABOLIC PANEL
ALT: 9 U/L (ref 0–35)
AST: 27 U/L (ref 0–37)
Albumin: 2.2 g/dL — ABNORMAL LOW (ref 3.5–5.2)
Alkaline Phosphatase: 121 U/L — ABNORMAL HIGH (ref 39–117)
BUN: 25 mg/dL — ABNORMAL HIGH (ref 6–23)
Chloride: 107 mEq/L (ref 96–112)
Potassium: 4.5 mEq/L (ref 3.5–5.1)
Sodium: 141 mEq/L (ref 135–145)
Total Bilirubin: 0.6 mg/dL (ref 0.3–1.2)

## 2012-08-11 LAB — CBC
HCT: 26.2 % — ABNORMAL LOW (ref 36.0–46.0)
Platelets: 145 10*3/uL — ABNORMAL LOW (ref 150–400)
RDW: 13.6 % (ref 11.5–15.5)
WBC: 10 10*3/uL (ref 4.0–10.5)

## 2012-08-11 LAB — PREALBUMIN: Prealbumin: 7.3 mg/dL — ABNORMAL LOW (ref 17.0–34.0)

## 2012-08-11 MED ORDER — HYDRALAZINE HCL 20 MG/ML IJ SOLN
10.0000 mg | Freq: Four times a day (QID) | INTRAMUSCULAR | Status: DC | PRN
  Administered 2012-08-12 – 2012-08-16 (×6): 10 mg via INTRAVENOUS
  Filled 2012-08-11 (×7): qty 0.5

## 2012-08-11 MED ORDER — CLINIMIX E/DEXTROSE (5/20) 5 % IV SOLN
INTRAVENOUS | Status: AC
  Administered 2012-08-11: 18:00:00 via INTRAVENOUS
  Filled 2012-08-11: qty 1000

## 2012-08-11 MED ORDER — DEXTROSE-NACL 5-0.9 % IV SOLN
INTRAVENOUS | Status: AC
  Administered 2012-08-11: 01:00:00 via INTRAVENOUS

## 2012-08-11 MED ORDER — ONDANSETRON HCL 4 MG/2ML IJ SOLN
4.0000 mg | INTRAMUSCULAR | Status: DC | PRN
  Administered 2012-08-12 – 2012-08-14 (×6): 4 mg via INTRAVENOUS
  Filled 2012-08-11 (×6): qty 2

## 2012-08-11 MED ORDER — MORPHINE SULFATE 2 MG/ML IJ SOLN
1.0000 mg | INTRAMUSCULAR | Status: DC | PRN
  Administered 2012-08-11 – 2012-08-16 (×17): 2 mg via INTRAVENOUS
  Filled 2012-08-11 (×21): qty 1

## 2012-08-11 MED ORDER — SODIUM CHLORIDE 0.9 % IJ SOLN
10.0000 mL | Freq: Two times a day (BID) | INTRAMUSCULAR | Status: DC
  Administered 2012-08-11: 10 mL
  Administered 2012-08-12: 30 mL
  Administered 2012-08-12 – 2012-08-13 (×2): 10 mL
  Administered 2012-08-13: 40 mL
  Administered 2012-08-14 – 2012-08-18 (×8): 10 mL
  Administered 2012-08-19: 20 mL
  Administered 2012-08-20 (×2): 10 mL
  Administered 2012-08-21: 20 mL
  Administered 2012-08-21 – 2012-08-23 (×5): 10 mL

## 2012-08-11 MED ORDER — ONDANSETRON HCL 4 MG PO TABS: 4.0000 mg | ORAL_TABLET | ORAL | Status: DC | PRN

## 2012-08-11 MED ORDER — SODIUM CHLORIDE 0.9 % IV SOLN: INTRAVENOUS | Status: AC

## 2012-08-11 MED ORDER — SODIUM CHLORIDE 0.9 % IJ SOLN
10.0000 mL | INTRAMUSCULAR | Status: DC | PRN
  Administered 2012-08-15: 10 mL

## 2012-08-11 MED ORDER — LABETALOL HCL 5 MG/ML IV SOLN
10.0000 mg | Freq: Once | INTRAVENOUS | Status: AC
  Administered 2012-08-11: 10 mg via INTRAVENOUS
  Filled 2012-08-11: qty 4

## 2012-08-11 NOTE — Progress Notes (Signed)
Physical Therapy Treatment/Re-assessment Patient Details Name: Amy [name] MRN: [id number] DOB: Jan 11, 1922 Today's Date: 08/11/2012 Time: [id number] PT Time Calculation (min): 30 min  PT Assessment / Plan / Recommendation Comments on Treatment Session  Pt s/p lt hip hemiarthroplasty and lt forearm ORIF.  Post op with esophogeal tear and surgery 08/09/12 for that.  Pt now NPO and for TNA. Pt will need extended recovery.    Follow Up Recommendations  Post acute inpatient     Does the patient have the potential to tolerate intense rehabilitation  No, Recommend LTACH  Barriers to Discharge        Equipment Recommendations   (platform RW)    Recommendations for Other Services    Frequency Min 3X/week   Plan Discharge plan needs to be updated;Frequency needs to be updated    Precautions / Restrictions Precautions Precautions: Fall;Posterior Hip Restrictions LUE Weight Bearing: Weight bear through elbow only LLE Weight Bearing: Weight bearing as tolerated   Pertinent Vitals/Pain VSS    Mobility  Bed Mobility Bed Mobility: Rolling Left;Rolling Right Rolling Right: 1: +2 Total assist Rolling Right: Patient Percentage: 10% Rolling Left: 1: +2 Total assist Rolling Left: Patient Percentage: 10% Transfers Transfer via Lift Equipment: [place] Details for Transfer Assistance: Used maxisky on long loops at knees and shoulders to maintain hip precautions.    Exercises Total Joint Exercises Quad Sets: AAROM;Left;10 reps;Supine Heel Slides: AAROM;Left;10 reps;Supine Hip ABduction/ADduction: AAROM;Left;10 reps;Supine   PT Diagnosis:    PT Problem List:   PT Treatment Interventions:     PT Goals Acute Rehab PT Goals PT Goal Formulation: With patient Time For Goal Achievement: 08/25/12 Potential to Achieve Goals: Fair Pt will go Supine/Side to Sit: with +2 total assist (pt = 60%) PT Goal: Supine/Side to Sit - Progress: Goal set today Pt will go Sit to Supine/Side: with +2 total  assist (pt = 60%) PT Goal: Sit to Supine/Side - Progress: Goal set today Pt will go Sit to Stand: with +2 total assist (pt = 60%) PT Goal: Sit to Stand - Progress: Goal set today Pt will go Stand to Sit: with +2 total assist (pt= 60%) PT Goal: Stand to Sit - Progress: Goal set today Pt will Transfer Bed to Chair/Chair to Bed: with +2 total assist (pt = 60%) PT Transfer Goal: Bed to Chair/Chair to Bed - Progress: Goal set today  Visit Information  Last PT Received On: 08/11/12 Assistance Needed: +2 Reason Eval/Treat Not Completed: Patient not medically ready    Subjective Data  Subjective: Pt stating she needs a pad for urinary incontinence   Cognition  Overall Cognitive Status: Appears within functional limits for tasks assessed/performed Arousal/Alertness: Lethargic Orientation Level: Appears intact for tasks assessed Behavior During Session: Lethargic    Balance     End of Session PT - End of Session Equipment Utilized During Treatment: Other (comment) (maxisky) Activity Tolerance: Patient limited by fatigue;Patient limited by pain Patient left: in chair;with call bell/phone within reach Nurse Communication: Mobility status;Need for lift equipment   GP     Rober Skeels 08/11/2012, 2:14 PM  [facility] PT [phone]

## 2012-08-11 NOTE — Progress Notes (Signed)
   CARDIOTHORACIC SURGERY PROGRESS NOTE   R1 Day Post-Op Procedure(s) (LRB): ESOPHAGOSCOPY (N/A) INCISION AND DRAINAGE (Right)  Subjective: Somewhat groggy but arousable, just received pain medications.  Objective: Vital signs: BP Readings from Last 1 Encounters:  08/11/12 128/49   Pulse Readings from Last 1 Encounters:  08/11/12 97   Resp Readings from Last 1 Encounters:  08/11/12 20   Temp Readings from Last 1 Encounters:  08/11/12 98.3 F (36.8 C) Axillary    Hemodynamics:    Physical Exam:  Rhythm:   sinus  Breath sounds: Coarse rhonchi both sides  Heart sounds:  RRR  Incisions:  Appropriate drainage from [place] drains  Abdomen:  soft  Extremities:  warm   Intake/Output from previous day: 10/11 0701 - 10/12 0700 In: [id number] [P.O.:120; I.V.:1325; IV Piggyback:10] Out: -  Intake/Output this shift: Total I/O In: 255 [I.V.:225; NG/GT:30] Out: 350 [Urine:350]  Lab Results:  Basename 08/11/12 0528 08/10/12 0600  WBC 10.0 11.7*  HGB 8.6* 8.6*  HCT 26.2* 26.2*  PLT 145* 143*   BMET:  Basename 08/11/12 0528 08/10/12 0600  NA 141 136  K 4.5 4.3  CL 107 101  CO2 26 24  GLUCOSE 165* 107*  BUN 25* 26*  CREATININE 1.16* 1.18*  CALCIUM 9.2 8.9    CBG (last 3)  No results found for this basename: GLUCAP:3 in the last 72 hours ABG    Component Value Date/Time   PHART 7.427* 08/07/2008 0515   HCO3 23.6 08/07/2008 0515   TCO2 26 08/07/2008 1658   ACIDBASEDEF 1.0 08/07/2008 0515   O2SAT 99.0 08/07/2008 0515   CXR: *RADIOLOGY REPORT*  Clinical Data: Line placement  PORTABLE CHEST - 1 VIEW  Comparison: 08/08/2012  Findings: There is a right-sided line entering the superior vena  cava by subclavian approach. The tip is at the cavoatrial  junction. There is no pneumothorax. There is mild hazy density at  the left lung base possibly representing atelectasis. The lungs  are otherwise clear. An NG tube has been placed and just crosses  the gastroesophageal  junction. Cardiac pacer is stable. Heart  size is normal.  IMPRESSION:  NG tube and right central line placement.  Original Report Authenticated By: Rachael Fee, M.D.   Assessment/Plan: S/P Procedure(s) (LRB): ESOPHAGOSCOPY (N/A) INCISION AND DRAINAGE (Right)  Overall stable s/p transcervical drainage of esophageal perforation with mediastinitis Must remain strict NPO Agree with plans for TNA - would d/c IV fluids once TNA started Ultimately may be able to switch to gastric feeds Continue IV Zosyn Would wait at least 5-7 days before repeat gastrograffin study Postop atelectasis - needs aggressive pulm toilet and mobility Impaired physical mobility w/ recent fall and left wrist + hip fractures - needs to resume PT ASAP   I will follow peripherally  Please call if specific problems or questions arise  Aliahna Statzer H 08/11/2012 10:59 AM

## 2012-08-11 NOTE — Progress Notes (Signed)
Peripherally Inserted Central Catheter/Midline Placement  The IV Nurse has discussed with the patient and/or persons authorized to consent for the patient, the purpose of this procedure and the potential benefits and risks involved with this procedure.  The benefits include less needle sticks, lab draws from the catheter and patient may be discharged home with the catheter.  Risks include, but not limited to, infection, bleeding, blood clot (thrombus formation), and puncture of an artery; nerve damage and irregular heat beat.  Alternatives to this procedure were also discussed.  Consent obtained at bedside from daughter as patient was falling asleep during explanation, patient verbalized that she wished for her daughter to sign.  PICC/Midline Placement Documentation  PICC Triple Lumen [id number] PICC Right Basilic (Active)       Raffaela Ladley, Nicolette Bang 08/11/2012, 9:13 AM

## 2012-08-11 NOTE — Progress Notes (Signed)
PARENTERAL NUTRITION CONSULT NOTE - INITIAL  Pharmacy Consult for TPN Indication: Esophageal perforation, s/p esophagoscopy and I&D, plans for NPO  Allergies  Allergen Reactions  . Hydrocodone-Acetaminophen Itching    Tolerates tylenol    Patient Measurements: Height: 5\' 5"  (165.1 cm) Weight: 150 lb (68.04 kg) IBW/kg (Calculated) : 57  Adjusted Body Weight: 65Kg  Vital Signs: Temp: 98.3 F (36.8 C) (10/12 0755) Temp src: Axillary (10/12 0755) BP: 128/49 mmHg (10/12 1000) Pulse Rate: 97  (10/12 1000) Intake/Output from previous day: 10/11 0701 - 10/12 0700 In: [id number] [P.O.:120; I.V.:1325; IV Piggyback:10] Out: -  Intake/Output from this shift: Total I/O In: 255 [I.V.:225; NG/GT:30] Out: 350 [Urine:350]  Labs:  [facility] 08/11/12 0528 08/10/12 0600 08/09/12 0520  WBC 10.0 11.7* 14.1*  HGB 8.6* 8.6* 9.9*  HCT 26.2* 26.2* 29.8*  PLT 145* 143* 161  APTT -- -- --  INR -- -- --     Basename 08/11/12 0528 08/10/12 0600 08/09/12 0520  NA 141 136 134*  K 4.5 4.3 4.8  CL 107 101 99  CO2 26 24 26   GLUCOSE 165* 107* 140*  BUN 25* 26* 24*  CREATININE 1.16* 1.18* 1.50*  LABCREA -- -- --  [id number] -- -- --  CALCIUM 9.2 8.9 8.7  MG -- -- --  PHOS -- -- --  PROT 5.7* -- --  ALBUMIN 2.2* -- --  AST 27 -- --  ALT 9 -- --  ALKPHOS 121* -- --  BILITOT 0.6 -- --  BILIDIR -- -- --  IBILI -- -- --  PREALBUMIN -- -- --  TRIG -- -- --  CHOLHDL -- -- --  CHOL -- -- --   Estimated Creatinine Clearance: 30.2 ml/min (by C-G formula based on Cr of 1.16).   No results found for this basename: GLUCAP:3 in the last 72 hours  Medical History: Past Medical History  Diagnosis Date  . Hypertension   . Hypercholesteremia   . Myocardial infarction     Inferior STEMI 07/2008 - PCI of prox RCA followed byu CABG x 3 in 9/'09  . Pneumonia   . Anxiety   . CAD in native artery     s/p CABG x 3; Last Myoview in 07/2009 - non-ischemic; Echo 03/2012  Aortic Sclerosis with normal EF.    Marland Kitchen Asthma   . Pacemaker   . GERD (gastroesophageal reflux disease)   . Arthritis     Medications:  Prescriptions prior to admission  Medication Sig Dispense Refill  . acetaminophen (TYLENOL) 500 MG tablet Take 1,000 mg by mouth every 6 (six) hours as needed. Pain      . aspirin 81 MG tablet Take 81 mg by mouth 2 (two) times daily.        . ciprofloxacin (CIPRO) 500 MG tablet Take 500 mg by mouth 2 (two) times daily.      . fish oil-omega-3 fatty acids 1000 MG capsule Take 4 g by mouth daily.      . hydrochlorothiazide (HYDRODIURIL) 25 MG tablet Take 25 mg by mouth daily.      . metoprolol succinate (TOPROL-XL) 25 MG 24 hr tablet Take 25 mg by mouth daily.      . niacin (NIASPAN) 500 MG CR tablet Take 500 mg by mouth at bedtime.        Marland Kitchen omeprazole (PRILOSEC) 20 MG capsule Take 20 mg by mouth daily.        . valsartan (DIOVAN) 160 MG tablet Take 160 mg by mouth daily.  Insulin Requirements in the past 24 hours:  No SSI  Current Nutrition:  NPO  Nutritional Goals:  ~1800 kCal, ~75 grams of protein per day- pending RD recommendations  Assessment: Admit: Patient presented to [facility] s/p fall and fractures needing repair, she was transferred here for hip and wrist fracture repair. S/p fracture repair she complained of odynophagia and underwent upper endoscopy per GI, which showed a contained esophageal perforation. She is s/p Incision and drainage, right neck and mediastinum.  GI: Currently NPO, plans to keep this way for now to allow healing of esophageal tear, plans for gastrograffin swallow in ~ 5-7 days. Noted patient has been without significant nutrients since 10/8- continues on D5NS at 72ml/hr. Moderate refeeding risk. Endo: NO hx DM, A1c 5.5% at BL Lytes: Lytes are WNL at [facility] Renal: Scr continues to trend down Pulm: 2L [place] Cards: BP and HR elevated, cont on IV lopressor Hepatobil: LFTs WNL at BL ID:  Zosyn#[id number] empirically for esophageal perf. WBC wnl, afebrile. Best  Practices: LMWH TPN Access: Triple lumen PICC TPN day#: 1  Plan:  - Start Clinimix E 5/20 at 40cc/hr tonight, with ultimate goal of 65cc/hr to get to estimated protein goal of 75gm/day and average est Kcal of 1525 Kcal based on three times a week lipid supplementation. Will target ~ 2.4 L/day. - Will f/up for RD recommendations on more accurate protein and Kcal goals - Will get TPN labs in AM - Starting at 1800, will change MIVF to NS at 60 ml/hr - Will supplement MVI, trace elements and IV fats on MWF only d/t ongoing national shortages. - Will get CBG q 6h when TPN starts to assess need for SSI- if CBGs remain controlled, will d/c further CBG checks.  Thanks, Bryanah Sidell K. Posey Pronto, PharmD, BCPS.  Clinical Pharmacist Pager [phone]. 08/11/2012 11:40 AM

## 2012-08-11 NOTE — Progress Notes (Signed)
Patient ID: Amy [name], female   DOB: 02/08/1922, 78 y.o.   MRN: [id number] PATIENT ID: Amy [name]  MRN: [id number]  DOB/AGE:  June 27, 1922 / 78 y.o.  1 Day Post-Op Procedure(s) (LRB): ESOPHAGOSCOPY (N/A) INCISION AND DRAINAGE (Right)    PROGRESS NOTE Subjective: Patient is alert, ,no Nausea, no Vomiting, yes passing gas, no Bowel Movement. NPO getting TPN. Non verbal. , PAS in place. Ambulate physical therapy in room now Patient reports pain as moderate  With motion   Comfortable lying in bed .    Objective: Vital signs in last 24 hours: Filed Vitals:   08/11/12 1000 08/11/12 1100 08/11/12 1159 08/11/12 1200  BP: 128/49 129/51  127/48  Pulse: 97 94  93  Temp:   99.8 F (37.7 C)   TempSrc:   Axillary   Resp: 20 15  16   Height:      Weight:      SpO2: 96% 96%  98%      Intake/Output from previous day: I/O last 3 completed shifts: In: [id number] [P.O.:120; I.V.:1325; IV Piggyback:10] Out: -    Intake/Output this shift: Total I/O In: 435 [I.V.:375; NG/GT:60] Out: 350 [Urine:350]   LABORATORY DATA:  Basename 08/11/12 0528 08/10/12 0600  WBC 10.0 11.7*  HGB 8.6* 8.6*  HCT 26.2* 26.2*  PLT 145* 143*  NA 141 136  K 4.5 4.3  CL 107 101  CO2 26 24  BUN 25* 26*  CREATININE 1.16* 1.18*  GLUCOSE 165* 107*  GLUCAP -- --  INR -- --  CALCIUM 9.2 --    Examination: Neurologically intact ABD soft Neurovascular intact Sensation intact distally Intact pulses distally Incision: scant drainage} Hip incision doing well staples intact.  Left arm in cast fingers swollen with capillary refill.  All fingers move on request.  Assessment:   1 Day Post-Op Procedure(s) (LRB): ESOPHAGOSCOPY (N/A) INCISION AND DRAINAGE (Right) ADDITIONAL DIAGNOSIS: Principal Problem:  *Mediastinitis S/P esophageal perforation, I&D 10/11 Active Problems:  Radial head fracture, closed, Lt  Hypertension  Hyperlipidemia, statin intol.  CAD, CABG X 3 10/09. Myoview low risk 10/10. EF >55% 2D 6/13.  Pre-operative clearance  Fracture of left hip after fall at home.  Aortic valve sclerosis, no stenosis  Carotid bruit present bilat, dopplers OK 6/13  UTI, just finnished 10 days of Cipro  Hx of dizziness, ? orthostatic in setting of UTI ? mild dehydration  Chronic renal insufficiency, stage III (moderate), SCr 1.4 Sept 2013  PAF/SSS  Pacemaker March 2010 (MDT)  DVT after previous hip surgery  Dysphagia, new post op, esophageal perforation by endoscopy  Status post hip hemiarthroplasty left hip by Dr Ronnie Derby 08/08/2012  Closed fracture of left distal radius s/p ORIF by Dr Burney Gauze 08/08/2012  S/P right total hip arthroplasty by Dr Noemi Chapel in 2009   Plan: PT/OT WBAT, left hip hemiarthroplasty  posterior precautions  DVT Prophylaxis: SCD  DISCHARGE PLAN: Skilled Nursing Facility/Rehab  DISCHARGE NEEDS: social work for placement at the [employer]

## 2012-08-11 NOTE — Progress Notes (Signed)
[facility] PROGRESS NOTE  Amy [name] [id number] DOB: [birth year] DOA: 08/07/2012 PCP: Purvis Kilts, MD  Assessment/Plan: Principal Problem:  *Pre-operative clearance Active Problems:  Radial head fracture, closed, Lt  Hypertension  Hyperlipidemia, statin intol.  CAD, CABG X 3 10/09. Myoview low risk 10/10. EF >55% 2D 6/13.  Fracture of left hip after fall at home.  Aortic valve sclerosis, no stenosis  Carotid bruit present bilat, dopplers OK 6/13  UTI, just finnished 10 days of Cipro  Hx of dizziness, ? orthostatic in setting of UTI ? mild dehydration  Chronic renal insufficiency, stage III (moderate), SCr 1.4 Sept 2013  PAF/SSS  Pacemaker March 2010 (MDT)  DVT after previous hip surgery  Dysphagia, new post op      Radial fracture/impacted hip fracture,, given extensive history including underlying coronary artery disease, status post CABG, pacemaker placement, CAD, CABG X 3 10/09. Myoview low risk 10/10. EF >55% 2D 6/13  cardiology clearance obtained, status post Open reduction and internal fixation, distal radius fracture  left distal radius, left carpal tunnel release. Awaiting SNF placement    Esophageal perforation/Ent   ./thoracic surgery consulted Status post endoscopy and confirmed esophageal perforation/CT scan shows extensive pneumomediastinum  Status post incision and drainage and right neck and many after transcervical approach and cervical esophagoscope by Dr. [name], MD, continue IV antibiotics, strict n.p.o., nasogastric tube, PICC line in the morning with initiation of TNA will repeat a Gastrografin swallow and then consider for possible advancement of the drains    Right ankle pain x-rays done yesterday did not show any obvious fracture    Paroxysmal A. fib now resolved, will remain high risk for postoperative atrial fibrillation, continue perioperative beta blocker , rate controlled    Hypertension holding ARB and  hydrochlorothiazide, due to mild renal insufficiency , postoperative hypotension  ,  Recent UTI status post treatment with ciprofloxacin    Coronary artery disease, negative cardiac enzymes, history of Inferior STEMI 07/2008 - PCI of prox RCA followed byu CABG x 3 in 9/'09 , continue aspirin    Acute blood loss anemia hemoglobin has dropped to 8.9, will transfuse if dropped below 7 without any active bleeding     Dyslipidemia on niacin and fish which will be continued    Chronic kidney disease, stage III, creatinine at baseline, mproving everyday    Code Status: full  Family Communication: family updated about patient's clinical progress   Disposition Plan: SNF when bed available  Brief narrative:  78 year old female who lives alone, who had a mechanical fall while going up the steps at her front of her home. She states she did not experience any loss of consciousness. She denies any chest pain, palpitations. At that time she denied lightheadedness however, her daughter states her blood pressures been running in the low 100s recently and the patient is also been having some dizziness. The patient is also been having some inner ear issues and has been on Antivert as needed.  The patient's last major surgery with a CABG 4 years ago along with a postoperative pacemaker. She states she does not get chest pains, no history of CHF. She does have some chronic shortness of breath for years which remains unchanged.  Enroute in the ambulance she did have a episode off A. fib with RVR heart rate to 120. She spontaneously reverted to sinus rhythm. Patient transferred from [facility]  Consultants:  Orthopedics  Cardiology  HPI/Subjective:  Difficulty swallowing, but improved  Objective: Filed Vitals:  08/11/12 0600 08/11/12 0630 08/11/12 0700 08/11/12 0755  BP: 184/61 187/61 184/59   Pulse:      Temp:    98.3 F (36.8 C)  TempSrc:    Axillary  Resp: 21 17 17    Height:      Weight:        SpO2:   96%     Intake/Output Summary (Last 24 hours) at 08/11/12 0810 Last data filed at 08/11/12 0700  Gross per 24 hour  Intake   1455 ml  Output      0 ml  Net   1455 ml    Exam:  HENT:  Head: Atraumatic.  Nose: Nose normal.  Mouth/Throat: Oropharynx is clear and moist.  Eyes: Conjunctivae are normal. Pupils are equal, round, and reactive to light. No scleral icterus.  Neck: Neck supple. No tracheal deviation present.  Cardiovascular: Normal rate, regular rhythm, normal heart sounds and intact distal pulses.  Pulmonary/Chest: Effort normal and breath sounds normal. No respiratory distress.  Abdominal: Soft. Normal appearance and bowel sounds are normal. She exhibits no distension. There is no tenderness.  Musculoskeletal: She exhibits no edema and no tenderness.  Neurological: She is alert. No cranial nerve deficit.    Data Reviewed: Basic Metabolic Panel:  Lab [id number] 0528 08/10/12 0600 08/09/12 0520 08/08/12 0600 08/07/12 1141  NA 141 136 134* 134* 136  K 4.5 4.3 4.8 4.3 3.8  CL 107 101 99 99 98  CO2 26 24 26 25 29   GLUCOSE 165* 107* 140* 96 102*  BUN 25* 26* 24* 23 24*  CREATININE 1.16* 1.18* 1.50* 1.32* 1.45*  CALCIUM 9.2 8.9 8.7 9.6 10.2  MG -- -- -- -- --  PHOS -- -- -- -- --    Liver Function Tests:  Lab 08/11/12 0528 08/07/12 1141  AST 27 33  ALT 9 21  ALKPHOS 121* 87  BILITOT 0.6 0.3  PROT 5.7* 6.9  ALBUMIN 2.2* 3.7   No results found for this basename: LIPASE:5,AMYLASE:5 in the last 168 hours No results found for this basename: AMMONIA:5 in the last 168 hours  CBC:  Lab 08/11/12 0528 08/10/12 0600 08/09/12 0520 08/08/12 0600 08/07/12 1141  WBC 10.0 11.7* 14.1* 7.0 5.9  NEUTROABS -- -- -- -- 4.2  HGB 8.6* 8.6* 9.9* 11.5* 13.7  HCT 26.2* 26.2* 29.8* 34.3* 40.2  MCV 91.3 90.3 90.0 89.1 88.7  PLT 145* 143* 161 166 182    Cardiac Enzymes: No results found for this basename: CKTOTAL:5,CKMB:5,CKMBINDEX:5,TROPONINI:5 in the last 168  hours BNP (last 3 results) No results found for this basename: PROBNP:3 in the last 8760 hours   CBG: No results found for this basename: GLUCAP:5 in the last 168 hours  Recent Results (from the past 240 hour(s))  URINE CULTURE     Status: Normal   Collection Time   08/07/12 11:24 AM      Component Value Range Status Comment   Specimen Description URINE, CATHETERIZED   Final    Special Requests NONE   Final    Culture  Setup Time 08/07/2012 16:50   Final    Colony Count NO GROWTH   Final    Culture NO GROWTH   Final    Report Status 08/08/2012 FINAL   Final   SURGICAL PCR SCREEN     Status: Normal   Collection Time   08/08/12  5:32 AM      Component Value Range Status Comment   MRSA, PCR NEGATIVE  NEGATIVE Final  Staphylococcus aureus NEGATIVE  NEGATIVE Final   WOUND CULTURE     Status: Normal (Preliminary result)   Collection Time   08/10/12 10:29 PM      Component Value Range Status Comment   Specimen Description WOUND ESOPHAGUS   Final    Special Requests POF ZOSYN   Final    Gram Stain PENDING   Incomplete    Culture NO GROWTH 1 DAY   Final    Report Status PENDING   Incomplete      Studies: Dg Chest 1 View  08/07/2012  *RADIOLOGY REPORT*  Clinical Data: Fall  CHEST - 1 VIEW  Comparison: 03/25/2012  Findings: Left subclavian sequential transvenous pacemaker leads project at right atrium and right ventricle. Upper normal heart size post CABG. Calcified tortuous aorta. Pulmonary vascularity normal. Lungs grossly clear. No pleural effusion or pneumothorax. Minimal biapical scarring. Diffuse osseous demineralization.  IMPRESSION: Post CABG and pacemaker. No acute abnormalities.   Original Report Authenticated By: Burnetta Sabin, M.D.    Dg Wrist Complete Left  08/07/2012  *RADIOLOGY REPORT*  Clinical Data: Left wrist pain and swelling post fall  LEFT WRIST - COMPLETE 3+ VIEW  Comparison: 03/07/2005  Findings: Minimally displaced ulnar styloid fracture. Comminuted distal left  radial metaphyseal fracture with intra- articular extension at the radiocarpal joint. Question extension into the distal radioulnar joint as well. Dorsal tilt of the distal radial articular surface. Overlying soft tissue swelling. Minimal degenerative changes at first [facility] joint. No additional acute fracture or dislocation identified. Osseous demineralization.  IMPRESSION: Ulnar styloid fracture. Angulated mildly displaced intra-articular distal left radial metaphyseal fracture. Osseous demineralization.   Original Report Authenticated By: Burnetta Sabin, M.D.    Dg Hip Complete Left  08/07/2012  *RADIOLOGY REPORT*  Clinical Data: Left hip pain post fall  LEFT HIP - COMPLETE 2+ VIEW  Comparison: None  Findings: Minimally impacted left femoral neck fracture. Osseous mineralization appears diffusely decreased. Left hip joint space preserved. No additional fracture or dislocation identified. Symmetric SI joints. Pelvis appears intact.  IMPRESSION: Minimally impacted left femoral neck fracture.   Original Report Authenticated By: Burnetta Sabin, M.D.    Ct Head Wo Contrast  08/07/2012  *RADIOLOGY REPORT*  Clinical Data:  Fall.  CT HEAD WITHOUT CONTRAST CT CERVICAL SPINE WITHOUT CONTRAST  Technique:  Multidetector CT imaging of the head and cervical spine was performed following the standard protocol without intravenous contrast.  Multiplanar CT image reconstructions of the cervical spine were also generated.  Comparison:   None  CT HEAD  Findings: No skull fracture or intracranial hemorrhage.  No CT evidence of large acute infarct.  Mild small vessel disease type changes.  No hydrocephalus.  No intracranial mass lesion detected on this unenhanced exam.  Vascular calcifications.  IMPRESSION: No skull fracture or intracranial hemorrhage.  CT CERVICAL SPINE  Findings: No cervical spine fracture.  Dextroscoliosis and degenerative changes.  Spinal stenosis most notable C4-5.  Biapical lung scarring.  Carotid  calcifications.  IMPRESSION: No cervical spine fracture.  Cervical spondylotic changes with spinal stenosis most notable C4- 5.   Original Report Authenticated By: Doug Sou, M.D.    Ct Chest W Contrast  08/10/2012  *RADIOLOGY REPORT*  Clinical Data: Neck pain.  Evaluate for possible perforated esophagus.  CT CHEST WITH CONTRAST  Technique:  Multidetector CT imaging of the chest was performed following the standard protocol during bolus administration of intravenous contrast.  Contrast:  80 ml of Omnipaque-300.  Comparison: No priors.  Findings:  Mediastinum: Images 30-32 of series 5 demonstrate an apparent defect in the posterior wall of the lower cervical esophagus with a large amount of oral contrast material extending from this defect into the adjacent posterior mediastinum.  This contrast material and multiple locules of gas tract inferiorly through the mediastinum to the right side of the esophagus to the level of the aortic arch, and multiple locules of gas extend in the fat around the carina.  There is also pneumomediastinum more anteriorly throughout the middle and anterior mediastinum, as well as tracking up into the soft tissues of the neck. A tiny amount of pneumomediastinum is also noted adjacent to the distal esophagus shortly above the esophageal hiatus.  Heart size is mildly enlarged. There is no significant pericardial fluid, thickening or pericardial calcification.  A left sided pacemaker device in place with leads terminating in the right atrium and right ventricular apex.There is atherosclerosis of the thoracic aorta, the great vessels of the mediastinum and the coronary arteries, including calcified atherosclerotic plaque in the left main, left anterior descending, left circumflex and right coronary arteries. The patient is status post median sternotomy for CABG with a LIMA to the LAD.  There are also two other graft markers in place in the proximal descending thoracic aorta, but no  patent bypass grafts are confidently identified on this non gated examination.  Lungs/Pleura: A small right pleural effusion and trace left pleural effusion, both of which are layering dependently.  Slight diffuse prominence of the interstitial markings.  No frank honeycombing or traction bronchiectasis identified to suggest underlying interstitial lung disease.  No acute consolidative airspace disease.  Mild bilateral apical pleuroparenchymal thickening, most compatible with scarring.  Upper Abdomen: Visualized portions are unremarkable.  Musculoskeletal: Sternotomy wires.  Mild deformity of several anterolateral right ribs likely reflects old healed rib fractures. There are no aggressive appearing lytic or blastic lesions noted in the visualized portions of the skeleton.  IMPRESSION: 1.  Findings are compatible with perforation of the distal cervical esophagus shortly above the level of the thoracic inlet with extravasation of oral contrast material into the posterior mediastinum, and extensive pneumomediastinum and cervical gas, as above. 2.  Small right and trace left pleural effusions layer dependently and are simple in appearance at this time. 3.  Mild cardiomegaly. 4. Atherosclerosis, including left main and three-vessel coronary artery disease.  The patient is status post CABG with a LIMA to the LAD.  Two additional graft markers are noted on the proximal ascending thoracic aorta, and no definite patent bypass grafts are seen extending from these graft markers at this time on this non gated CT examination.  These results were called by telephone on 08/10/2012 at 08:33 p.m. to Dr. Roxy Manns, who verbally acknowledged these results.   Original Report Authenticated By: Etheleen Mayhew, M.D.    Ct Cervical Spine Wo Contrast  08/07/2012  *RADIOLOGY REPORT*  Clinical Data:  Fall.  CT HEAD WITHOUT CONTRAST CT CERVICAL SPINE WITHOUT CONTRAST  Technique:  Multidetector CT imaging of the head and cervical spine was  performed following the standard protocol without intravenous contrast.  Multiplanar CT image reconstructions of the cervical spine were also generated.  Comparison:   None  CT HEAD  Findings: No skull fracture or intracranial hemorrhage.  No CT evidence of large acute infarct.  Mild small vessel disease type changes.  No hydrocephalus.  No intracranial mass lesion detected on this unenhanced exam.  Vascular calcifications.  IMPRESSION: No skull fracture or intracranial hemorrhage.  CT  CERVICAL SPINE  Findings: No cervical spine fracture.  Dextroscoliosis and degenerative changes.  Spinal stenosis most notable C4-5.  Biapical lung scarring.  Carotid calcifications.  IMPRESSION: No cervical spine fracture.  Cervical spondylotic changes with spinal stenosis most notable C4- 5.   Original Report Authenticated By: Doug Sou, M.D.    Dg Chest Port 1 View  08/08/2012  *RADIOLOGY REPORT*  Clinical Data: Preoperative study.  Shortness of breath.  PORTABLE CHEST - 1 VIEW  Comparison: Chest x-ray 08/07/2012.  Findings: The lung volumes are normal.  No consolidative airspace disease.  No definite pleural effusions.  Bilateral apical pleuroparenchymal thickening, similar to prior examinations, most compatible with scarring.  Prominence of the central pulmonary arteries could suggest pulmonary arterial hypertension. Mild pulmonary venous congestion without frank pulmonary edema.  Heart size is mildly enlarged (unchanged). The patient is rotated to the right on today's exam, resulting in distortion of the mediastinal contours and reduced diagnostic sensitivity and specificity for mediastinal pathology.  Atherosclerotic calcifications are noted within the arch of the aorta.  Left-sided pacemaker device in place with lead tips projecting over the expected location of the right atrium and right ventricular apex.  IMPRESSION: 1.  Cardiomegaly with mild pulmonary venous congestion, but no frank pulmonary edema. 2.  In  addition, there is dilatation of the central pulmonary arteries, which could suggest pulmonary arterial hypertension. 3.  Atherosclerosis. 4.  Postoperative changes and support apparatus, as above.   Original Report Authenticated By: Etheleen Mayhew, M.D.    Dg Shoulder Left  08/08/2012  *RADIOLOGY REPORT*  Clinical Data: Pain.  Recent injury.  LEFT SHOULDER - 2+ VIEW  Comparison: None.  Findings: No evidence of fracture or dislocation.  No significant degenerative change of the glenohumeral joint.  Ordinary osteoarthritis of the AC joint.  Pacemaker overlies the region. There are old appearing left rib fractures, not primarily evaluated.  IMPRESSION: No evidence of fracture or dislocation.  Osteoarthritis of the [facility] joint.   Original Report Authenticated By: Jules Schick, M.D.    Dg Foot Complete Right  08/09/2012  *RADIOLOGY REPORT*  Clinical Data: Right foot pain.  Postop surgery for fracture left hip and left radial fracture.  RIGHT FOOT COMPLETE - 3+ VIEW  Comparison: Radiographs 08/06/2011.  Findings: There is motion on the AP view.  Mild osteopenia is noted.  The previously noted fracture involving the base of the fifth metatarsal has healed.  There is no evidence of acute fracture or dislocation.  There are stable degenerative changes of the first metatarsal phalangeal joint.  The tibial sesamoid of the first metatarsal is bipartite.  IMPRESSION: No acute osseous findings.   Original Report Authenticated By: Vivia Ewing, M.D.    Dg Esophagus W/water Sol Cm  08/10/2012  *RADIOLOGY REPORT*  Clinical Data: Elenora Gamma with ulceration or perforation of the esophagus just below the upper esophageal sphincter on endoscopy today.  ESOPHOGRAM/BARIUM SWALLOW  Technique:  Single contrast examination was performed using water soluble contrast (Omnipaque-300).  Fluoroscopy time:  1.1 minutes.  Comparison:  Chest radiographs 08/08/2012.  Chest CT 08/15/2008.  Findings:  The patient has limited  mobility due to a recent left hip fracture.  The study was initiated with the patient in the right posterior oblique position. The cervical esophagus appears normal.  Just below the thoracic inlet, there is focal extravasation of contrast into the right superior mediastinum. With additional swallowing in the supine position, this became better defined, but appears contained.  There is no extravasation  into the pleural space.  The distal esophagus was incompletely evaluated but appears grossly normal.  Some contrast enters the stomach.  IMPRESSION: Contained perforation of the proximal thoracic esophagus with extraluminal contrast collection in the superior right mediastinum. In review of the prior chest CT, there was no evidence of underlying esophageal diverticulum in this area.  Results have been reviewed with Dr. Benson Norway shortly after the procedure.   Original Report Authenticated By: Vivia Ewing, M.D.     Scheduled Meds:   . bisacodyl  10 mg Rectal Once  . enoxaparin  40 mg Subcutaneous Q24H  . labetalol  10 mg Intravenous Once  . metoprolol  2.5 mg Intravenous Once  . metoprolol  2.5 mg Intravenous Q8H  . pantoprazole (PROTONIX) IV  40 mg Intravenous Q12H  . piperacillin-tazobactam (ZOSYN)  IV  3.375 g Intravenous Q8H  . sodium chloride  3 mL Intravenous Q12H  . DISCONTD: aspirin EC  81 mg Oral Daily  . DISCONTD: lidocaine  20 mL Mouth/Throat Once  . DISCONTD: metoprolol succinate  50 mg Oral Daily  . DISCONTD: pantoprazole  40 mg Oral Q1200   Continuous Infusions:   . dextrose 5 % and 0.9% NaCl 75 mL/hr at 08/11/12 0052  . DISCONTD: sodium chloride 75 mL/hr (08/10/12 1115)  . DISCONTD: sodium chloride      Principal Problem:  *Pre-operative clearance Active Problems:  Radial head fracture, closed, Lt  Hypertension  Hyperlipidemia, statin intol.  CAD, CABG X 3 10/09. Myoview low risk 10/10. EF >55% 2D 6/13.  Fracture of left hip after fall at home.  Aortic valve sclerosis, no  stenosis  Carotid bruit present bilat, dopplers OK 6/13  UTI, just finnished 10 days of Cipro  Hx of dizziness, ? orthostatic in setting of UTI ? mild dehydration  Chronic renal insufficiency, stage III (moderate), SCr 1.4 Sept 2013  PAF/SSS  Pacemaker March 2010 (MDT)  DVT after previous hip surgery  Dysphagia, new post op    Time spent: 40 minutes   [facility] Pager [phone]. If 8PM-8AM, please contact night-coverage at [email or web address], password [facility] 08/11/2012, 8:10 AM  LOS: 4 days

## 2012-08-11 NOTE — Progress Notes (Signed)
PT Cancellation Note  Patient Details Name: Amy [name] MRN: [id number] DOB: 1922/05/28   Cancelled Treatment:    Reason Eval/Treat Not Completed: Patient not medically ready. Per attending MD pt too lethargic.  Will try again tomorrow.   Olyn Landstrom 08/11/2012, 10:46 AM

## 2012-08-11 NOTE — Progress Notes (Signed)
INITIAL ADULT NUTRITION ASSESSMENT Date: 08/11/2012   Time: 12:41 PM Reason for Assessment: TPN Consult  INTERVENTION: Pharmacy to dose TPN.  DOCUMENTATION CODES Per approved criteria  -Not Applicable    ASSESSMENT: Female 78 y.o.  Dx: Pre-operative clearance  Hx:  Past Medical History  Diagnosis Date  . Hypertension   . Hypercholesteremia   . Myocardial infarction     Inferior STEMI 07/2008 - PCI of prox RCA followed byu CABG x 3 in 9/'09  . Pneumonia   . Anxiety   . CAD in native artery     s/p CABG x 3; Last Myoview in 07/2009 - non-ischemic; Echo 03/2012  Aortic Sclerosis with normal EF.  Marland Kitchen Asthma   . Pacemaker   . GERD (gastroesophageal reflux disease)   . Arthritis    Past Surgical History  Procedure Date  . Coronary artery bypass graft 2009    LIMA-LAD, SVG-RI, SVG-stented RCA  . Total hip arthroplasty   . Pacemaker insertion   . Insert / replace / remove pacemaker   . Wrist fracture surgery 07/2012    left intra articular  with carpel tunnel  . Carpal tunnel release 08/08/2012    Procedure: CARPAL TUNNEL RELEASE;  Surgeon: Schuyler Amor, MD;  Location: [facility];  Service: Orthopedics;  Laterality: Left;  . Hip arthroplasty 08/08/2012    Procedure: ARTHROPLASTY BIPOLAR HIP;  Surgeon: Rudean Haskell, MD;  Location: [facility];  Service: Orthopedics;  Laterality: Left;  [place]    Related Meds:  Scheduled Meds:   . enoxaparin  40 mg Subcutaneous Q24H  . labetalol  10 mg Intravenous Once  . metoprolol  2.5 mg Intravenous Q8H  . pantoprazole (PROTONIX) IV  40 mg Intravenous Q12H  . piperacillin-tazobactam (ZOSYN)  IV  3.375 g Intravenous Q8H  . sodium chloride  10-40 mL Intracatheter Q12H  . sodium chloride  3 mL Intravenous Q12H  . DISCONTD: aspirin EC  81 mg Oral Daily  . DISCONTD: lidocaine  20 mL Mouth/Throat Once  . DISCONTD: metoprolol succinate  50 mg Oral Daily  . DISCONTD: pantoprazole  40 mg Oral Q1200   Continuous Infusions:   . sodium chloride     . dextrose 5 % and 0.9% NaCl 75 mL/hr at 08/11/12 0052  . TPN (CLINIMIX) +/- additives    . DISCONTD: sodium chloride 75 mL/hr (08/10/12 1115)  . DISCONTD: sodium chloride     PRN Meds:.hydrALAZINE, iohexol, morphine injection, morphine injection, ondansetron (ZOFRAN) IV, ondansetron, sodium chloride, DISCONTD: acetaminophen, DISCONTD: alum & mag hydroxide-simeth, DISCONTD: midazolam, DISCONTD:  morphine injection, DISCONTD: ondansetron, DISCONTD: oxyCODONE, DISCONTD: senna-docusate, DISCONTD: traMADol-acetaminophen, DISCONTD: zolpidem  Ht: 5\' 5"  (165.1 cm)  Wt: 150 lb (68.04 kg)  Ideal Wt: 56.8 kg % Ideal Wt: 120%  Usual Wt:  Wt Readings from Last 10 Encounters:  08/07/12 150 lb (68.04 kg)  08/07/12 150 lb (68.04 kg)  08/07/12 150 lb (68.04 kg)  08/07/12 150 lb (68.04 kg)  03/25/12 150 lb (68.04 kg)  08/06/11 160 lb (72.576 kg)  07/28/09 163 lb 8 oz (74.163 kg)   % Usual Wt: 100%  Body mass index is 24.96 kg/(m^2). WNL  Food/Nutrition Related Hx: Pt reports no recent wt changes   Labs:  CMP     Component Value Date/Time   NA 141 08/11/2012 0528   K 4.5 08/11/2012 0528   CL 107 08/11/2012 0528   CO2 26 08/11/2012 0528   GLUCOSE 165* 08/11/2012 0528   BUN 25* 08/11/2012 [id number]  CREATININE 1.16* 08/11/2012 0528   CALCIUM 9.2 08/11/2012 0528   PROT 5.7* 08/11/2012 0528   ALBUMIN 2.2* 08/11/2012 0528   AST 27 08/11/2012 0528   ALT 9 08/11/2012 0528   ALKPHOS 121* 08/11/2012 0528   BILITOT 0.6 08/11/2012 0528   GFRNONAA 41* 08/11/2012 0528   GFRAA 47* 08/11/2012 0528  CBG (last 3)  No results found for this basename: GLUCAP:3 in the last 72 hours Sodium  Date/Time Value Range Status  08/11/2012  5:28 AM 141  135 - 145 mEq/L Final  08/10/2012  6:00 AM 136  135 - 145 mEq/L Final  08/09/2012  5:20 AM 134* 135 - 145 mEq/L Final    Potassium  Date/Time Value Range Status  08/11/2012  5:28 AM 4.5  3.5 - 5.1 mEq/L Final  08/10/2012  6:00 AM 4.3  3.5 - 5.1 mEq/L  Final  08/09/2012  5:20 AM 4.8  3.5 - 5.1 mEq/L Final    No results found for this basename: phos    Magnesium  Date/Time Value Range Status  08/07/2008  4:50 PM 2.4   Final  08/07/2008  5:00 AM 2.4   Final  08/06/2008  8:45 PM 2.8*  Final    Intake/Output Summary (Last 24 hours) at 08/11/12 1243 Last data filed at 08/11/12 1200  Gross per 24 hour  Intake   1770 ml  Output    350 ml  Net   1420 ml   Last BM: 08/07/12  Diet Order: NPO  Supplements/Tube Feeding:  Will start this PM on Clinimix E 5/20 at 40cc/hr, with ultimate goal of 65cc/hr. Lipids (20% IVFE @ 10 ml/hr), multivitamins, and trace elements are provided 3 times weekly (MWF) due to national backorder.  Provides 1552 kcal and 78 grams protein daily (based on weekly average). Meets 100% minimum estimated kcal and 101% minimum estimated protein needs.    IVF:    sodium chloride   dextrose 5 % and 0.9% NaCl Last Rate: 75 mL/hr at 08/11/12 0052  TPN (CLINIMIX) +/- additives   DISCONTD: sodium chloride Last Rate: 75 mL/hr (08/10/12 1115)  DISCONTD: sodium chloride    Pt admitted on 10/8 after fall at home (pt lives alone). She is POD # 3 s/p ORIF of L hip, POD # 2 s/p ORIF of L arm fx . Pt was experiencing odynophagia and underwent EGD which found a esophageal perforation. Pt is POD # 1 s/p esophagoscopy with I&D for esophageal perforation with mediastinitis. Pt must remain strict NPO. TPN ordered. Gastrografin study planned in 5-7 days.   Estimated Nutritional Needs:   Kcal:  1550-1800 Protein:  77-90 grams Fluid:  >2 L/day  NUTRITION DIAGNOSIS: -Inadequate oral intake r/t inability to eat AEB NPO status.  MONITORING/EVALUATION(Goals): Goal: Pt to meet >/= 90% of their estimated nutrition needs. Monitor: Ability to transition to enteral nutrition, weight, labs  EDUCATION NEEDS: -No education needs identified at this time  [place], [place], [place] Weekend/After Hours Pager  08/11/2012, 12:41  PM

## 2012-08-11 NOTE — Anesthesia Postprocedure Evaluation (Signed)
  Anesthesia Post-op Note  Patient: Amy [name]  Procedure(s) Performed: Procedure(s) (LRB) with comments: ESOPHAGOSCOPY (N/A) INCISION AND DRAINAGE (Right) - exploration of perforated esophagus  Patient Location: PACU  Anesthesia Type: General  Level of Consciousness: awake  Airway and Oxygen Therapy: Patient Spontanous Breathing  Post-op Pain: mild  Post-op Assessment: Post-op Vital signs reviewed, Patient's Cardiovascular Status Stable, Respiratory Function Stable, Patent Airway, No signs of Nausea or vomiting and Pain level controlled  Post-op Vital Signs: stable  Complications: No apparent anesthesia complications

## 2012-08-11 NOTE — Progress Notes (Signed)
08/11/2012 1:05 PM  [name], [name]  Post-Op Day 1    Temp:  [97.6 F (36.4 C)-99.8 F (37.7 C)] 99.8 F (37.7 C) (10/12 1159) Pulse Rate:  [69-117] 93  (10/12 1200) Resp:  [14-22] 16  (10/12 1200) BP: (119-205)/(41-100) 127/48 mmHg (10/12 1200) SpO2:  [85 %-100 %] 98 % (10/12 1200),     Intake/Output Summary (Last 24 hours) at 08/11/12 1305 Last data filed at 08/11/12 1200  Gross per 24 hour  Intake   1770 ml  Output    350 ml  Net   1420 ml    Results for orders placed during the hospital encounter of 08/07/12 (from the past 24 hour(s))  WOUND CULTURE     Status: Normal (Preliminary result)   Collection Time   08/10/12 10:29 PM      Component Value Range   Specimen Description WOUND ESOPHAGUS     Special Requests POF ZOSYN     Gram Stain PENDING     Culture NO GROWTH 1 DAY     Report Status PENDING    CBC     Status: Abnormal   Collection Time   08/11/12  5:28 AM      Component Value Range   WBC 10.0  4.0 - 10.5 K/uL   RBC 2.87 (*) 3.87 - 5.11 MIL/uL   Hemoglobin 8.6 (*) 12.0 - 15.0 g/dL   HCT 26.2 (*) 36.0 - 46.0 %   MCV 91.3  78.0 - 100.0 fL   MCH 30.0  26.0 - 34.0 pg   MCHC 32.8  30.0 - 36.0 g/dL   RDW 13.6  11.5 - 15.5 %   Platelets 145 (*) 150 - 400 K/uL  COMPREHENSIVE METABOLIC PANEL     Status: Abnormal   Collection Time   08/11/12  5:28 AM      Component Value Range   Sodium 141  135 - 145 mEq/L   Potassium 4.5  3.5 - 5.1 mEq/L   Chloride 107  96 - 112 mEq/L   CO2 26  19 - 32 mEq/L   Glucose, Bld 165 (*) 70 - 99 mg/dL   BUN 25 (*) 6 - 23 mg/dL   Creatinine, Ser 1.16 (*) 0.50 - 1.10 mg/dL   Calcium 9.2  8.4 - 10.5 mg/dL   Total Protein 5.7 (*) 6.0 - 8.3 g/dL   Albumin 2.2 (*) 3.5 - 5.2 g/dL   AST 27  0 - 37 U/L   ALT 9  0 - 35 U/L   Alkaline Phosphatase 121 (*) 39 - 117 U/L   Total Bilirubin 0.6  0.3 - 1.2 mg/dL   GFR calc non Af Amer 41 (*) >90 mL/min   GFR calc Af Amer 47 (*) >90 mL/min  PREALBUMIN     Status: Abnormal   Collection Time   08/11/12  5:28 AM      Component Value Range   Prealbumin 7.3 (*) 17.0 - 34.0 mg/dL    SUBJECTIVE:  Somewhat sleepy per nurses.    OBJECTIVE:  Mod serosanguineous drainage, including mucoid component, probably saliva.  Wound intact.  IMPRESSION:  Satisfactory check  PLAN:  TNA is planned so will discontinue NG tube for patient comfort.  May take some time for the esophageal perforation to seal.  Aggressive nutrition is critical now.  Amy [name]

## 2012-08-11 NOTE — Progress Notes (Signed)
Deep tissue wound 1cm by 6cm on rt and left mid posterior buttocks wound dry wound is purple in the middle and red around the edges

## 2012-08-12 ENCOUNTER — Inpatient Hospital Stay (HOSPITAL_COMMUNITY): Payer: Medicare Other

## 2012-08-12 DIAGNOSIS — D62 Acute posthemorrhagic anemia: Secondary | ICD-10-CM | POA: Diagnosis not present

## 2012-08-12 LAB — COMPREHENSIVE METABOLIC PANEL
ALT: 7 U/L (ref 0–35)
Alkaline Phosphatase: 92 U/L (ref 39–117)
GFR calc Af Amer: 58 mL/min — ABNORMAL LOW (ref >90)
Glucose, Bld: 156 mg/dL — ABNORMAL HIGH (ref 70–99)
Potassium: 3.6 mEq/L (ref 3.5–5.1)
Sodium: 142 mEq/L (ref 135–145)
Total Protein: 5.1 g/dL — ABNORMAL LOW (ref 6.0–8.3)

## 2012-08-12 LAB — CBC
HCT: 21.1 % — ABNORMAL LOW (ref 36.0–46.0)
Hemoglobin: 6.9 g/dL — CL (ref 12.0–15.0)
MCH: 29.9 pg (ref 26.0–34.0)
MCH: 29.8 pg (ref 26.0–34.0)
MCV: 91.3 fL (ref 78.0–100.0)
MCV: 91.7 fL (ref 78.0–100.0)
Platelets: 179 10*3/uL (ref 150–400)
RBC: 2.31 MIL/uL — ABNORMAL LOW (ref 3.87–5.11)
RBC: 2.05 MIL/uL — ABNORMAL LOW (ref 3.87–5.11)
RDW: 13.8 % (ref 11.5–15.5)
WBC: 8.9 10*3/uL (ref 4.0–10.5)

## 2012-08-12 LAB — PREPARE RBC (CROSSMATCH)

## 2012-08-12 LAB — DIFFERENTIAL
Eosinophils Absolute: 0.1 10*3/uL (ref 0.0–0.7)
Lymphs Abs: 0.7 10*3/uL (ref 0.7–4.0)
Monocytes Relative: 7 % (ref 3–12)
Neutro Abs: 6.7 10*3/uL (ref 1.7–7.7)
Neutrophils Relative %: 83 % — ABNORMAL HIGH (ref 43–77)

## 2012-08-12 LAB — GLUCOSE, CAPILLARY: Glucose-Capillary: 136 mg/dL — ABNORMAL HIGH (ref 70–99)

## 2012-08-12 MED ORDER — POTASSIUM PHOSPHATE DIBASIC 3 MMOLE/ML IV SOLN
20.0000 mmol | Freq: Once | INTRAVENOUS | Status: AC
  Administered 2012-08-12: 20 mmol via INTRAVENOUS
  Filled 2012-08-12: qty 6.67

## 2012-08-12 MED ORDER — SODIUM CHLORIDE 0.9 % IV SOLN
INTRAVENOUS | Status: DC
  Administered 2012-08-12 – 2012-08-13 (×2): via INTRAVENOUS

## 2012-08-12 MED ORDER — POTASSIUM CHLORIDE 10 MEQ/50ML IV SOLN
10.0000 meq | INTRAVENOUS | Status: AC
  Administered 2012-08-12 (×3): 10 meq via INTRAVENOUS
  Filled 2012-08-12 (×3): qty 50

## 2012-08-12 MED ORDER — FUROSEMIDE 10 MG/ML IJ SOLN
INTRAMUSCULAR | Status: AC
  Filled 2012-08-12: qty 4

## 2012-08-12 MED ORDER — CLINIMIX E/DEXTROSE (5/20) 5 % IV SOLN
INTRAVENOUS | Status: AC
  Administered 2012-08-12: 18:00:00 via INTRAVENOUS
  Filled 2012-08-12: qty 2000

## 2012-08-12 MED ORDER — ACETAMINOPHEN 10 MG/ML IV SOLN
1000.0000 mg | Freq: Four times a day (QID) | INTRAVENOUS | Status: AC | PRN
  Administered 2012-08-12 – 2012-08-13 (×2): 1000 mg via INTRAVENOUS
  Filled 2012-08-12 (×2): qty 100

## 2012-08-12 MED ORDER — FUROSEMIDE 10 MG/ML IJ SOLN
20.0000 mg | Freq: Once | INTRAMUSCULAR | Status: AC
  Administered 2012-08-12: 20 mg via INTRAVENOUS

## 2012-08-12 NOTE — Progress Notes (Signed)
PARENTERAL NUTRITION CONSULT NOTE - FOLLOW UP  Pharmacy Consult for TPN Indication: Esophageal perforation, s/p esophagoscopy and I&D, plans for NPO  Allergies  Allergen Reactions  . Hydrocodone-Acetaminophen Itching    Tolerates tylenol    Patient Measurements: Height: 5\' 5"  (165.1 cm) Weight: 150 lb (68.04 kg) IBW/kg (Calculated) : 57  Adjusted Body Weight: 65 Kg  Vital Signs: Temp: 97.5 F (36.4 C) (10/13 0000) Temp src: Oral (10/13 0000) BP: 118/86 mmHg (10/13 0700) Pulse Rate: 94  (10/13 0700) Intake/Output from previous day: 10/12 0701 - 10/13 0700 In: 2160 [I.V.:1380; NG/GT:60; IV Piggyback:160; TPN:560] Out: 800 [Urine:700; Emesis/NG output:100] Intake/Output from this shift:    Labs:  [facility] 08/12/12 0450 08/11/12 0528 08/10/12 0600  WBC 8.0 10.0 11.7*  HGB 6.9* 8.6* 8.6*  HCT 21.1* 26.2* 26.2*  PLT 145* 145* 143*  APTT -- -- --  INR -- -- --     Basename 08/12/12 0450 08/11/12 0528 08/10/12 0600  NA 142 141 136  K 3.6 4.5 4.3  CL 108 107 101  CO2 29 26 24   GLUCOSE 156* 165* 107*  BUN 28* 25* 26*  CREATININE 0.98 1.16* 1.18*  LABCREA -- -- --  CREAT24HRUR -- -- --  CALCIUM 9.2 9.2 8.9  MG 2.1 -- --  PHOS 1.9* -- --  PROT 5.1* 5.7* --  ALBUMIN 1.8* 2.2* --  AST 17 27 --  ALT 7 9 --  ALKPHOS 92 121* --  BILITOT 0.5 0.6 --  BILIDIR -- -- --  IBILI -- -- --  PREALBUMIN -- 7.3* --  TRIG 117 -- --  CHOLHDL -- -- --  CHOL 146 -- --   Estimated Creatinine Clearance: 35.7 ml/min (by C-G formula based on Cr of 0.98).    Basename 08/12/12 0012 08/11/12 1730  GLUCAP 141* 129*    Insulin Requirements in the past 24 hours:  No SSI ordered  Current Nutrition:  NPO, TPN at ~50% goal  Nutritional Goals:  1550-1800 kCal, 77-90 grams of protein per day per RD assessment  Assessment:  Admit: Patient presented to [facility] s/p fall and fractures needing repair, she was transferred here for hip and wrist fracture repair. S/p fracture repair she  complained of odynophagia and underwent upper endoscopy per GI, which showed a contained esophageal perforation. She is s/p Incision and drainage, right neck and mediastinum.  GI: Currently NPO, plans to keep this way for now to allow healing of esophageal tear, plans for gastrograffin swallow in ~ 5-7 days. Noted patient has been without significant nutrients since 10/8. Moderate refeeding risk.  Endo: No hx DM, A1c 5.5% at BL, CBG control ok without insulin supplementation  Lytes: Phos is low today, but doubt that she is refeeding since K and Mg are WNL. Other lytes ok.  Renal: Scr continues to trend down, UOP low  Pulm: 2L [place]  Cards: VSS, continues on IV lopressor  Hepatobil: LFTs, cholesterol and triglycerides are WNL, prealbumin pending  ID: [id number] empirically for esophageal perf. WBC wnl, afebrile.  Best Practices: LMWH  TPN Access: Triple lumen PICC  TPN day#: 2  Plan:  - Increase Clinimix E 5/20 to 65cc/hr tonight, will provide protein goal of 78gm/day and average est Kcal of 1552 Kcal based on three times a week lipid supplementation.  - Starting at 1800, will change NS to 35 ml/hr  - Will supplement MVI, trace elements and IV fats on MWF only d/t ongoing Engineer, manufacturing systems.  - Will continue CBG q 6h to assess  need for SSI- if CBGs remain controlled on goal TPN, will d/c further CBG checks tomorrow. - Will give 20 mmol of Kphos today (this will also provide ~32meq of K) - Will f/up CMET, Mg and Phos in AM  Thanks, Buffey Zabinski K. Posey Pronto, PharmD, BCPS.  Clinical Pharmacist Pager [phone]. 08/12/2012 7:21 AM

## 2012-08-12 NOTE — Progress Notes (Signed)
TCTS BRIEF SICU PROGRESS NOTE  2 Days Post-Op  S/P Procedure(s) (LRB): ESOPHAGOSCOPY (N/A) INCISION AND DRAINAGE (Right)   Stable day Received 2 units PRBC's Increased rales on exam CXR c/w CHF  Plan: Will give 1 dose lasix and supplement K+  Amy [name] 08/12/2012 6:07 PM

## 2012-08-12 NOTE — Progress Notes (Signed)
[facility] PROGRESS NOTE  Niger M Haymore [id number] DOB: 1922-07-29 DOA: 08/07/2012 PCP: Purvis Kilts, MD  Assessment/Plan: Principal Problem:  *Mediastinitis S/P esophageal perforation, I&D 10/11 Active Problems:  Radial head fracture, closed, Lt  Hypertension  Hyperlipidemia, statin intol.  CAD, CABG X 3 10/09. Myoview low risk 10/10. EF >55% 2D 6/13.  Pre-operative clearance  Fracture of left hip after fall at home.  Aortic valve sclerosis, no stenosis  Carotid bruit present bilat, dopplers OK 6/13  UTI, just finnished 10 days of Cipro  Hx of dizziness, ? orthostatic in setting of UTI ? mild dehydration  Chronic renal insufficiency, stage III (moderate), SCr 1.4 Sept 2013  PAF/SSS  Pacemaker March 2010 (MDT)  DVT after previous hip surgery  Dysphagia, new post op, esophageal perforation by endoscopy  Status post hip hemiarthroplasty left hip by Dr Ronnie Derby 08/08/2012  Closed fracture of left distal radius s/p ORIF by Dr Burney Gauze 08/08/2012  S/P right total hip arthroplasty by Dr Noemi Chapel in 2009    Radial fracture/impacted hip fracture,, given extensive history including underlying coronary artery disease, status post CABG, pacemaker placement, CAD, CABG X 3 10/09. Myoview low risk 10/10. EF >55% 2D 6/13  cardiology clearance obtained, status post Open reduction and internal fixation, distal radius fracture  left distal radius, left carpal tunnel release. Awaiting SNF placement , to follow up with Dr.Weingold next Tuesday for followup of her radial fracture Fracture repaired by Dr. Lara Mulch   Esophageal perforation/Ent .[facility] surgery consulted  Status post endoscopy and confirmed esophageal perforation/CT scan shows extensive pneumomediastinum  Status post incision and drainage and right neck and many after transcervical approach and cervical esophagoscope by Dr. [name], MD,  Must remain strict NPO  Started TNA - would d/c IV fluids once TNA started   Ultimately may be able to switch to gastric feeds  Continue IV Zosyn  Would wait at least 5-7 days before repeat gastrograffin study  Postop atelectasis - needs aggressive pulm toilet and mobility    Right ankle pain x-rays done yesterday did not show any obvious fracture    Paroxysmal A. fib now resolved, will remain high risk for postoperative atrial fibrillation, continue perioperative beta blocker , rate controlled    Hypertension holding ARB and hydrochlorothiazide, due to mild renal insufficiency , postoperative hypotension  ,  Recent UTI status post treatment with ciprofloxacin    Coronary artery disease, negative cardiac enzymes, history of Inferior STEMI 07/2008 - PCI of prox RCA followed byu CABG x 3 in 9/'09 , aspirin and Lovenox discontinued due to falling hemoglobin   Acute blood loss anemia hemoglobin has dropped to 6.9, will transfuse 2 units of packed red blood cells today  Dyslipidemia on niacin and fish which will be continued  Chronic kidney disease, stage III, creatinine at baseline, improving everyday    Code Status: full  Family Communication: family updated about patient's clinical progress  Disposition Plan: Plan is to discharge to skilled nursing facility next week, not stable for discharge as of yet   Brief narrative:  78 year old Amy [name] who lives alone, who had a mechanical fall while going up the steps at her front of her home. She did not experience any loss of consciousness. She denies any chest pain, palpitations. At that time she denied lightheadedness however, her daughter states her blood pressures been running in the low 100s recently and the patient is also been having some dizziness. The patient is also been having some inner ear issues and has been  on Antivert as needed.  The patient's last major surgery with a CABG 4 years ago along with a postoperative pacemaker. She states she does not get chest pains, no history of CHF. She does have some  chronic shortness of breath for years which remains unchanged.  Enroute in the ambulance she did have a episode off A. fib with RVR heart rate to 120. She spontaneously reverted to sinus rhythm. Patient transferred from [facility]    Cardiology clearance was obtained prior to surgery Dr. Burney Gauze operated upon her right radial fracture last week Dr. Ronnie Derby operated upon her left hip, underwent open reduction internal fixation per Dr. Ronnie Derby She underwent surgery after cardiology clearance  Postoperative course has been complicated by esophageal perforation Dr. Saralyn Pilar hung was consulted and esophageal perforation was discovered Since then patient has undergone transcervical drainage of esophageal perforation with mediastinitis Has been made strict n.p.o., currently on TNA, on IV antibiotics, will need to repeat a Gastrografin study in 5-7 days Anticipate a prolonged hospital course  Consultants:  Orthopedics  Cardiology  Gastroenterology Thoracic surgery ENT  HPI/Subjective:  Hemodynamically stable overnight  Objective: Filed Vitals:   08/12/12 0500 08/12/12 0600 08/12/12 0700 08/12/12 0743  BP: 131/51 160/51 118/86   Pulse: 88 77 94   Temp:    98.2 F (36.8 C)  TempSrc:    Oral  Resp: 15 18 28    Height:      Weight:      SpO2: 98% 99% 99%     Intake/Output Summary (Last 24 hours) at 08/12/12 0806 Last data filed at 08/12/12 0700  Gross per 24 hour  Intake   2005 ml  Output    450 ml  Net   1555 ml    Exam:  HENT:  Head: Atraumatic.  Nose: Nose normal.  Mouth/Throat: Oropharynx is clear and moist.  Eyes: Conjunctivae are normal. Pupils are equal, round, and reactive to light. No scleral icterus.  Neck: Neck supple. No tracheal deviation present.  Cardiovascular: Normal rate, regular rhythm, normal heart sounds and intact distal pulses.  Pulmonary/Chest: Effort normal and breath sounds normal. No respiratory distress.  Abdominal: Soft. Normal appearance and bowel  sounds are normal. She exhibits no distension. There is no tenderness.  Musculoskeletal: She exhibits no edema and no tenderness.  Neurological: She is alert. No cranial nerve deficit.    Data Reviewed: Basic Metabolic Panel:  Lab 0000000 0450 08/11/12 0528 08/10/12 0600 08/09/12 0520 08/08/12 0600  NA 142 141 136 134* 134*  K 3.6 4.5 4.3 4.8 4.3  CL 108 107 101 99 99  CO2 29 26 24 26 25   GLUCOSE 156* 165* 107* 140* 96  BUN 28* 25* 26* 24* 23  CREATININE 0.98 1.16* 1.18* 1.50* 1.32*  CALCIUM 9.2 9.2 8.9 8.7 9.6  MG 2.1 -- -- -- --  PHOS 1.9* -- -- -- --    Liver Function Tests:  Lab 08/12/12 0450 08/11/12 0528 08/07/12 1141  AST 17 27 33  ALT 7 9 21   ALKPHOS 92 121* 87  BILITOT 0.5 0.6 0.3  PROT 5.1* 5.7* 6.9  ALBUMIN 1.8* 2.2* 3.7   No results found for this basename: LIPASE:5,AMYLASE:5 in the last 168 hours No results found for this basename: AMMONIA:5 in the last 168 hours  CBC:  Lab 08/12/12 0450 08/11/12 0528 08/10/12 0600 08/09/12 0520 08/08/12 0600 08/07/12 1141  WBC 8.0 10.0 11.7* 14.1* 7.0 --  NEUTROABS 6.7 -- -- -- -- 4.2  HGB 6.9* 8.6* 8.6*  9.9* 11.5* --  HCT 21.1* 26.2* 26.2* 29.8* 34.3* --  MCV 91.3 91.3 90.3 90.0 89.1 --  PLT 145* 145* 143* 161 166 --    Cardiac Enzymes: No results found for this basename: CKTOTAL:5,CKMB:5,CKMBINDEX:5,TROPONINI:5 in the last 168 hours BNP (last 3 results) No results found for this basename: PROBNP:3 in the last 8760 hours   CBG:  Lab 08/12/12 0012 08/11/12 1730  GLUCAP 141* 129*    Recent Results (from the past 240 hour(s))  URINE CULTURE     Status: Normal   Collection Time   08/07/12 11:24 AM      Component Value Range Status Comment   Specimen Description URINE, CATHETERIZED   Final    Special Requests NONE   Final    Culture  Setup Time 08/07/2012 16:50   Final    Colony Count NO GROWTH   Final    Culture NO GROWTH   Final    Report Status 08/08/2012 FINAL   Final   SURGICAL PCR SCREEN     Status:  Normal   Collection Time   08/08/12  5:32 AM      Component Value Range Status Comment   MRSA, PCR NEGATIVE  NEGATIVE Final    Staphylococcus aureus NEGATIVE  NEGATIVE Final   WOUND CULTURE     Status: Normal (Preliminary result)   Collection Time   08/10/12 10:29 PM      Component Value Range Status Comment   Specimen Description WOUND ESOPHAGUS   Final    Special Requests POF ZOSYN   Final    Gram Stain PENDING   Incomplete    Culture NO GROWTH 1 DAY   Final    Report Status PENDING   Incomplete      Studies: Dg Chest 1 View  08/07/2012  *RADIOLOGY REPORT*  Clinical Data: Fall  CHEST - 1 VIEW  Comparison: 03/25/2012  Findings: Left subclavian sequential transvenous pacemaker leads project at right atrium and right ventricle. Upper normal heart size post CABG. Calcified tortuous aorta. Pulmonary vascularity normal. Lungs grossly clear. No pleural effusion or pneumothorax. Minimal biapical scarring. Diffuse osseous demineralization.  IMPRESSION: Post CABG and pacemaker. No acute abnormalities.   Original Report Authenticated By: Burnetta Sabin, M.D.    Dg Wrist Complete Left  08/07/2012  *RADIOLOGY REPORT*  Clinical Data: Left wrist pain and swelling post fall  LEFT WRIST - COMPLETE 3+ VIEW  Comparison: 03/07/2005  Findings: Minimally displaced ulnar styloid fracture. Comminuted distal left radial metaphyseal fracture with intra- articular extension at the radiocarpal joint. Question extension into the distal radioulnar joint as well. Dorsal tilt of the distal radial articular surface. Overlying soft tissue swelling. Minimal degenerative changes at first [facility] joint. No additional acute fracture or dislocation identified. Osseous demineralization.  IMPRESSION: Ulnar styloid fracture. Angulated mildly displaced intra-articular distal left radial metaphyseal fracture. Osseous demineralization.   Original Report Authenticated By: Burnetta Sabin, M.D.    Dg Hip Complete Left  08/07/2012  *RADIOLOGY  REPORT*  Clinical Data: Left hip pain post fall  LEFT HIP - COMPLETE 2+ VIEW  Comparison: None  Findings: Minimally impacted left femoral neck fracture. Osseous mineralization appears diffusely decreased. Left hip joint space preserved. No additional fracture or dislocation identified. Symmetric SI joints. Pelvis appears intact.  IMPRESSION: Minimally impacted left femoral neck fracture.   Original Report Authenticated By: Burnetta Sabin, M.D.    Ct Head Wo Contrast  08/07/2012  *RADIOLOGY REPORT*  Clinical Data:  Fall.  CT HEAD  WITHOUT CONTRAST CT CERVICAL SPINE WITHOUT CONTRAST  Technique:  Multidetector CT imaging of the head and cervical spine was performed following the standard protocol without intravenous contrast.  Multiplanar CT image reconstructions of the cervical spine were also generated.  Comparison:   None  CT HEAD  Findings: No skull fracture or intracranial hemorrhage.  No CT evidence of large acute infarct.  Mild small vessel disease type changes.  No hydrocephalus.  No intracranial mass lesion detected on this unenhanced exam.  Vascular calcifications.  IMPRESSION: No skull fracture or intracranial hemorrhage.  CT CERVICAL SPINE  Findings: No cervical spine fracture.  Dextroscoliosis and degenerative changes.  Spinal stenosis most notable C4-5.  Biapical lung scarring.  Carotid calcifications.  IMPRESSION: No cervical spine fracture.  Cervical spondylotic changes with spinal stenosis most notable C4- 5.   Original Report Authenticated By: Doug Sou, M.D.    Ct Soft Tissue Neck W Contrast  08/11/2012  *RADIOLOGY REPORT*  Clinical Data: Esophageal perforation.  CT NECK WITH CONTRAST  Technique:  Multidetector CT imaging of the neck was performed with intravenous contrast.  Contrast: 65mL OMNIPAQUE IOHEXOL 300 MG/ML  SOLN  Comparison: CT of the cervical spine 08/07/2012.  Findings: Limited imaging of the brain is unremarkable.  Extensive retropharyngeal gas is dissecting upwards from the  mediastinum.  There is a focal perforation of the esophagus at the level of the cricoid cartilage.  Oral contrast extravasates into the retropharyngeal space and communicates with the superior mediastinum.  Edematous changes and gas are present throughout the superior mediastinum.  Soft tissue gas perforate is about the larynx and thyroid, interdigitating with the strap musculature. There is also gas extending posterior to the carotid space on the right.  Atherosclerotic calcifications present at the aortic arch and at the carotid bifurcations bilaterally.  Of note, there is medial deviation of the right internal carotid artery within the retropharyngeal space with the artery is now surrounded by gas.  The larynx appears to be intact.  Vocal cords are symmetric.  No significant adenopathy is present.  The bone windows demonstrate multilevel spondylosis.  IMPRESSION:  1.  Esophageal perforation. The perforation appears to be at the level of the cricoid cartilage 2.  Extensive retropharyngeal gas with perforation posterior to the carotid space on the right and surrounding the thyroid gland and larynx. 3.  Medial deviation of the right internal carotid artery within the retropharyngeal space is surrounded by gas. 4.  Atherosclerosis. 5.  Moderate spondylosis of the cervical spine.   Original Report Authenticated By: Resa Miner. MATTERN, M.D.    Ct Chest W Contrast  08/10/2012  *RADIOLOGY REPORT*  Clinical Data: Neck pain.  Evaluate for possible perforated esophagus.  CT CHEST WITH CONTRAST  Technique:  Multidetector CT imaging of the chest was performed following the standard protocol during bolus administration of intravenous contrast.  Contrast:  80 ml of Omnipaque-300.  Comparison: No priors.  Findings:  Mediastinum: Images 30-32 of series 5 demonstrate an apparent defect in the posterior wall of the lower cervical esophagus with a large amount of oral contrast material extending from this defect into the  adjacent posterior mediastinum.  This contrast material and multiple locules of gas tract inferiorly through the mediastinum to the right side of the esophagus to the level of the aortic arch, and multiple locules of gas extend in the fat around the carina.  There is also pneumomediastinum more anteriorly throughout the middle and anterior mediastinum, as well as tracking up into the soft  tissues of the neck. A tiny amount of pneumomediastinum is also noted adjacent to the distal esophagus shortly above the esophageal hiatus.  Heart size is mildly enlarged. There is no significant pericardial fluid, thickening or pericardial calcification.  A left sided pacemaker device in place with leads terminating in the right atrium and right ventricular apex.There is atherosclerosis of the thoracic aorta, the great vessels of the mediastinum and the coronary arteries, including calcified atherosclerotic plaque in the left main, left anterior descending, left circumflex and right coronary arteries. The patient is status post median sternotomy for CABG with a LIMA to the LAD.  There are also two other graft markers in place in the proximal descending thoracic aorta, but no patent bypass grafts are confidently identified on this non gated examination.  Lungs/Pleura: A small right pleural effusion and trace left pleural effusion, both of which are layering dependently.  Slight diffuse prominence of the interstitial markings.  No frank honeycombing or traction bronchiectasis identified to suggest underlying interstitial lung disease.  No acute consolidative airspace disease.  Mild bilateral apical pleuroparenchymal thickening, most compatible with scarring.  Upper Abdomen: Visualized portions are unremarkable.  Musculoskeletal: Sternotomy wires.  Mild deformity of several anterolateral right ribs likely reflects old healed rib fractures. There are no aggressive appearing lytic or blastic lesions noted in the visualized portions of  the skeleton.  IMPRESSION: 1.  Findings are compatible with perforation of the distal cervical esophagus shortly above the level of the thoracic inlet with extravasation of oral contrast material into the posterior mediastinum, and extensive pneumomediastinum and cervical gas, as above. 2.  Small right and trace left pleural effusions layer dependently and are simple in appearance at this time. 3.  Mild cardiomegaly. 4. Atherosclerosis, including left main and three-vessel coronary artery disease.  The patient is status post CABG with a LIMA to the LAD.  Two additional graft markers are noted on the proximal ascending thoracic aorta, and no definite patent bypass grafts are seen extending from these graft markers at this time on this non gated CT examination.  These results were called by telephone on 08/10/2012 at 08:33 p.m. to Dr. Roxy Manns, who verbally acknowledged these results.   Original Report Authenticated By: Etheleen Mayhew, M.D.    Ct Cervical Spine Wo Contrast  08/07/2012  *RADIOLOGY REPORT*  Clinical Data:  Fall.  CT HEAD WITHOUT CONTRAST CT CERVICAL SPINE WITHOUT CONTRAST  Technique:  Multidetector CT imaging of the head and cervical spine was performed following the standard protocol without intravenous contrast.  Multiplanar CT image reconstructions of the cervical spine were also generated.  Comparison:   None  CT HEAD  Findings: No skull fracture or intracranial hemorrhage.  No CT evidence of large acute infarct.  Mild small vessel disease type changes.  No hydrocephalus.  No intracranial mass lesion detected on this unenhanced exam.  Vascular calcifications.  IMPRESSION: No skull fracture or intracranial hemorrhage.  CT CERVICAL SPINE  Findings: No cervical spine fracture.  Dextroscoliosis and degenerative changes.  Spinal stenosis most notable C4-5.  Biapical lung scarring.  Carotid calcifications.  IMPRESSION: No cervical spine fracture.  Cervical spondylotic changes with spinal stenosis most  notable C4- 5.   Original Report Authenticated By: Doug Sou, M.D.    Dg Chest Port 1 View  08/11/2012  *RADIOLOGY REPORT*  Clinical Data: Line placement  PORTABLE CHEST - 1 VIEW  Comparison: 08/08/2012  Findings: There is a right-sided line entering the superior vena cava by subclavian approach.  The  tip is at the cavoatrial junction.  There is no pneumothorax.  There is mild hazy density at the left lung base possibly representing atelectasis.  The lungs are otherwise clear.  An NG tube has been placed and just crosses the gastroesophageal junction.  Cardiac pacer is stable.  Heart size is normal.  IMPRESSION: NG tube and right central line placement.   Original Report Authenticated By: Rachael Fee, M.D.    Dg Chest Port 1 View  08/08/2012  *RADIOLOGY REPORT*  Clinical Data: Preoperative study.  Shortness of breath.  PORTABLE CHEST - 1 VIEW  Comparison: Chest x-ray 08/07/2012.  Findings: The lung volumes are normal.  No consolidative airspace disease.  No definite pleural effusions.  Bilateral apical pleuroparenchymal thickening, similar to prior examinations, most compatible with scarring.  Prominence of the central pulmonary arteries could suggest pulmonary arterial hypertension. Mild pulmonary venous congestion without frank pulmonary edema.  Heart size is mildly enlarged (unchanged). The patient is rotated to the right on today's exam, resulting in distortion of the mediastinal contours and reduced diagnostic sensitivity and specificity for mediastinal pathology.  Atherosclerotic calcifications are noted within the arch of the aorta.  Left-sided pacemaker device in place with lead tips projecting over the expected location of the right atrium and right ventricular apex.  IMPRESSION: 1.  Cardiomegaly with mild pulmonary venous congestion, but no frank pulmonary edema. 2.  In addition, there is dilatation of the central pulmonary arteries, which could suggest pulmonary arterial hypertension.  3.  Atherosclerosis. 4.  Postoperative changes and support apparatus, as above.   Original Report Authenticated By: Etheleen Mayhew, M.D.    Dg Shoulder Left  08/08/2012  *RADIOLOGY REPORT*  Clinical Data: Pain.  Recent injury.  LEFT SHOULDER - 2+ VIEW  Comparison: None.  Findings: No evidence of fracture or dislocation.  No significant degenerative change of the glenohumeral joint.  Ordinary osteoarthritis of the AC joint.  Pacemaker overlies the region. There are old appearing left rib fractures, not primarily evaluated.  IMPRESSION: No evidence of fracture or dislocation.  Osteoarthritis of the [facility] joint.   Original Report Authenticated By: Jules Schick, M.D.    Dg Foot Complete Right  08/09/2012  *RADIOLOGY REPORT*  Clinical Data: Right foot pain.  Postop surgery for fracture left hip and left radial fracture.  RIGHT FOOT COMPLETE - 3+ VIEW  Comparison: Radiographs 08/06/2011.  Findings: There is motion on the AP view.  Mild osteopenia is noted.  The previously noted fracture involving the base of the fifth metatarsal has healed.  There is no evidence of acute fracture or dislocation.  There are stable degenerative changes of the first metatarsal phalangeal joint.  The tibial sesamoid of the first metatarsal is bipartite.  IMPRESSION: No acute osseous findings.   Original Report Authenticated By: Vivia Ewing, M.D.    Dg Esophagus W/water Sol Cm  08/10/2012  *RADIOLOGY REPORT*  Clinical Data: Elenora Gamma with ulceration or perforation of the esophagus just below the upper esophageal sphincter on endoscopy today.  ESOPHOGRAM/BARIUM SWALLOW  Technique:  Single contrast examination was performed using water soluble contrast (Omnipaque-300).  Fluoroscopy time:  1.1 minutes.  Comparison:  Chest radiographs 08/08/2012.  Chest CT 08/15/2008.  Findings:  The patient has limited mobility due to a recent left hip fracture.  The study was initiated with the patient in the right posterior oblique  position. The cervical esophagus appears normal.  Just below the thoracic inlet, there is focal extravasation of contrast into the right superior  mediastinum. With additional swallowing in the supine position, this became better defined, but appears contained.  There is no extravasation into the pleural space.  The distal esophagus was incompletely evaluated but appears grossly normal.  Some contrast enters the stomach.  IMPRESSION: Contained perforation of the proximal thoracic esophagus with extraluminal contrast collection in the superior right mediastinum. In review of the prior chest CT, there was no evidence of underlying esophageal diverticulum in this area.  Results have been reviewed with Dr. Benson Norway shortly after the procedure.   Original Report Authenticated By: Vivia Ewing, M.D.     Scheduled Meds:   . metoprolol  2.5 mg Intravenous Q8H  . pantoprazole (PROTONIX) IV  40 mg Intravenous Q12H  . piperacillin-tazobactam (ZOSYN)  IV  3.375 g Intravenous Q8H  . potassium phosphate IVPB (mmol)  20 mmol Intravenous Once  . sodium chloride  10-40 mL Intracatheter Q12H  . sodium chloride  3 mL Intravenous Q12H  . DISCONTD: enoxaparin  40 mg Subcutaneous Q24H   Continuous Infusions:   . sodium chloride    . sodium chloride    . dextrose 5 % and 0.9% NaCl 75 mL/hr at 08/11/12 0052  . TPN (CLINIMIX) +/- additives 40 mL/hr at 08/11/12 1736  . TPN (CLINIMIX) +/- additives      Principal Problem:  *Mediastinitis S/P esophageal perforation, I&D 10/11 Active Problems:  Radial head fracture, closed, Lt  Hypertension  Hyperlipidemia, statin intol.  CAD, CABG X 3 10/09. Myoview low risk 10/10. EF >55% 2D 6/13.  Pre-operative clearance  Fracture of left hip after fall at home.  Aortic valve sclerosis, no stenosis  Carotid bruit present bilat, dopplers OK 6/13  UTI, just finnished 10 days of Cipro  Hx of dizziness, ? orthostatic in setting of UTI ? mild dehydration  Chronic renal  insufficiency, stage III (moderate), SCr 1.4 Sept 2013  PAF/SSS  Pacemaker March 2010 (MDT)  DVT after previous hip surgery  Dysphagia, new post op, esophageal perforation by endoscopy  Status post hip hemiarthroplasty left hip by Dr Ronnie Derby 08/08/2012  Closed fracture of left distal radius s/p ORIF by Dr Burney Gauze 08/08/2012  S/P right total hip arthroplasty by Dr Noemi Chapel in 2009    Time spent: One hour   [facility] Pager [phone]. If 8PM-8AM, please contact night-coverage at [email or web address], password [facility] 08/12/2012, 8:06 AM  LOS: 5 days

## 2012-08-12 NOTE — Progress Notes (Signed)
08/12/2012 1:26 PM  Amy [name], [name]  Post-Op Day 2    Temp:  [97.3 F (36.3 C)-98.2 F (36.8 C)] 98.1 F (36.7 C) (10/13 1146) Pulse Rate:  [77-99] 88  (10/13 1200) Resp:  [14-30] 26  (10/13 1200) BP: (111-169)/(43-86) 123/47 mmHg (10/13 1200) SpO2:  [88 %-100 %] 97 % (10/13 1200),     Intake/Output Summary (Last 24 hours) at 08/12/12 1326 Last data filed at 08/12/12 1200  Gross per 24 hour  Intake 2666.67 ml  Output    700 ml  Net 1966.67 ml    Results for orders placed during the hospital encounter of 08/07/12 (from the past 24 hour(s))  GLUCOSE, CAPILLARY     Status: Abnormal   Collection Time   08/11/12  5:30 PM      Component Value Range   Glucose-Capillary 129 (*) 70 - 99 mg/dL  GLUCOSE, CAPILLARY     Status: Abnormal   Collection Time   08/12/12 12:12 AM      Component Value Range   Glucose-Capillary 141 (*) 70 - 99 mg/dL  COMPREHENSIVE METABOLIC PANEL     Status: Abnormal   Collection Time   08/12/12  4:50 AM      Component Value Range   Sodium 142  135 - 145 mEq/L   Potassium 3.6  3.5 - 5.1 mEq/L   Chloride 108  96 - 112 mEq/L   CO2 29  19 - 32 mEq/L   Glucose, Bld 156 (*) 70 - 99 mg/dL   BUN 28 (*) 6 - 23 mg/dL   Creatinine, Ser 0.98  0.50 - 1.10 mg/dL   Calcium 9.2  8.4 - 10.5 mg/dL   Total Protein 5.1 (*) 6.0 - 8.3 g/dL   Albumin 1.8 (*) 3.5 - 5.2 g/dL   AST 17  0 - 37 U/L   ALT 7  0 - 35 U/L   Alkaline Phosphatase 92  39 - 117 U/L   Total Bilirubin 0.5  0.3 - 1.2 mg/dL   GFR calc non Af Amer 50 (*) >90 mL/min   GFR calc Af Amer 58 (*) >90 mL/min  PREALBUMIN     Status: Abnormal   Collection Time   08/12/12  4:50 AM      Component Value Range   Prealbumin 4.3 (*) 17.0 - 34.0 mg/dL  MAGNESIUM     Status: Normal   Collection Time   08/12/12  4:50 AM      Component Value Range   Magnesium 2.1  1.5 - 2.5 mg/dL  PHOSPHORUS     Status: Abnormal   Collection Time   08/12/12  4:50 AM      Component Value Range   Phosphorus 1.9 (*)  2.3 - 4.6 mg/dL  DIFFERENTIAL     Status: Abnormal   Collection Time   08/12/12  4:50 AM      Component Value Range   Neutrophils Relative 83 (*) 43 - 77 %   Neutro Abs 6.7  1.7 - 7.7 K/uL   Lymphocytes Relative 9 (*) 12 - 46 %   Lymphs Abs 0.7  0.7 - 4.0 K/uL   Monocytes Relative 7  3 - 12 %   Monocytes Absolute 0.6  0.1 - 1.0 K/uL   Eosinophils Relative 1  0 - 5 %   Eosinophils Absolute 0.1  0.0 - 0.7 K/uL   Basophils Relative 0  0 - 1 %   Basophils Absolute 0.0  0.0 - 0.1 K/uL  CBC  Status: Abnormal   Collection Time   08/12/12  4:50 AM      Component Value Range   WBC 8.0  4.0 - 10.5 K/uL   RBC 2.31 (*) 3.87 - 5.11 MIL/uL   Hemoglobin 6.9 (*) 12.0 - 15.0 g/dL   HCT 21.1 (*) 36.0 - 46.0 %   MCV 91.3  78.0 - 100.0 fL   MCH 29.9  26.0 - 34.0 pg   MCHC 32.7  30.0 - 36.0 g/dL   RDW 13.7  11.5 - 15.5 %   Platelets 145 (*) 150 - 400 K/uL  CHOLESTEROL, TOTAL     Status: Normal   Collection Time   08/12/12  4:50 AM      Component Value Range   Cholesterol 146  0 - 200 mg/dL  TRIGLYCERIDES     Status: Normal   Collection Time   08/12/12  4:50 AM      Component Value Range   Triglycerides 117  <150 mg/dL  TYPE AND SCREEN     Status: Normal (Preliminary result)   Collection Time   08/12/12  7:30 AM      Component Value Range   ABO/RH(D) O NEG     Antibody Screen NEG     Sample Expiration 08/15/2012     Unit Number [id number]     Blood Component Type RBC LR PHER1     Unit division 00     Status of Unit ISSUED     Transfusion Status OK TO TRANSFUSE     Crossmatch Result Compatible     Unit Number [id number]     Blood Component Type RED CELLS,LR     Unit division 00     Status of Unit ISSUED     Transfusion Status OK TO TRANSFUSE     Crossmatch Result Compatible    PREPARE RBC (CROSSMATCH)     Status: Normal   Collection Time   08/12/12  7:30 AM      Component Value Range   Order Confirmation ORDER PROCESSED BY BLOOD BANK    CBC     Status: Abnormal    Collection Time   08/12/12  7:30 AM      Component Value Range   WBC 8.9  4.0 - 10.5 K/uL   RBC 2.05 (*) 3.87 - 5.11 MIL/uL   Hemoglobin 6.1 (*) 12.0 - 15.0 g/dL   HCT 18.8 (*) 36.0 - 46.0 %   MCV 91.7  78.0 - 100.0 fL   MCH 29.8  26.0 - 34.0 pg   MCHC 32.4  30.0 - 36.0 g/dL   RDW 13.8  11.5 - 15.5 %   Platelets 179  150 - 400 K/uL  PREPARE RBC (CROSSMATCH)     Status: Normal   Collection Time   08/12/12  7:30 AM      Component Value Range   Order Confirmation ORDER PROCESSED BY BLOOD BANK    GLUCOSE, CAPILLARY     Status: Abnormal   Collection Time   08/12/12 11:48 AM      Component Value Range   Glucose-Capillary 165 (*) 70 - 99 mg/dL    SUBJECTIVE:  Nurses note quite sleepy.  More pain when awake. Some O2 desats.  OBJECTIVE:  Wound clean, neck flat.  Mod drainage, mostly saliva/serous  IMPRESSION:  Acute decrease in Hct, ? Blood loss in chest, in hip, ? Dilutional.  Anemia will impact negatively on healing.  Hypoxia, ? Chest disease,? Sedation, ? Technical artifact.  PLAN:  CXR.  TNA.  Check sat. monitor function.  Agree with PRBC.  If truly blood loss, may require more than 2 units.  [name]

## 2012-08-12 NOTE — Progress Notes (Signed)
Patient ID: Amy [name], female   DOB: [birth year], 78 y.o.   MRN: [id number] PATIENT ID: Amy M Daloia  MRN: [id number]  DOB/AGE:  01/08/1922 / 78 y.o.  2 Days Post-Op Procedure(s) (LRB): ESOPHAGOSCOPY (N/A) INCISION AND DRAINAGE (Right)    PROGRESS NOTE Subjective: Patient is very lethargic today, difficulty following commands, non verbal today,no Nausea, no Vomiting, NPO due to esophogeal tear.  On TPN. PAS in place. Ambulate none  .    Objective: Vital signs in last 24 hours: Filed Vitals:   08/12/12 1130 08/12/12 1145 08/12/12 1146 08/12/12 1200  BP: 139/47 139/53  123/47  Pulse: 97 77  88  Temp:   98.1 F (36.7 C)   TempSrc:   Oral   Resp: 24 19  26   Height:      Weight:      SpO2:    97%      Intake/Output from previous day: I/O last 3 completed shifts: In: [id number] [I.V.:2645; NG/GT:60; IV Piggyback:220] Out: 800 [Urine:700; Emesis/NG output:100]   Intake/Output this shift: Total I/O In: 1076.7 [I.V.:120; Blood:250; IV Piggyback:506.7; TPN:200] Out: 350 [Urine:350]   LABORATORY DATA:  Basename 08/12/12 1148 08/12/12 0730 08/12/12 0450 08/12/12 0012 08/11/12 1730 08/11/12 0528  WBC -- 8.9 8.0 -- -- --  HGB -- 6.1* 6.9* -- -- --  HCT -- 18.8* 21.1* -- -- --  PLT -- 179 145* -- -- --  NA -- -- 142 -- -- 141  K -- -- 3.6 -- -- 4.5  CL -- -- 108 -- -- 107  CO2 -- -- 29 -- -- 26  BUN -- -- 28* -- -- 25*  CREATININE -- -- 0.98 -- -- 1.16*  GLUCOSE -- -- 156* -- -- 165*  GLUCAP 165* -- -- 141* 129* --  INR -- -- -- -- -- --  CALCIUM -- -- 9.2 -- -- --    Examination: Neurologically intact ABD soft Sensation intact distally Intact pulses distally Incision: scant drainage} XR AP&Lat of hip shows well placed\fixed THA  Assessment:   2 Days Post-Op Procedure(s) (LRB): ESOPHAGOSCOPY (N/A) INCISION AND DRAINAGE (Right) ADDITIONAL DIAGNOSIS:   Principal Problem:  *Mediastinitis S/P esophageal perforation, I&D 10/11 Active Problems:  Radial head fracture,  closed, Lt  Hypertension  Hyperlipidemia, statin intol.  CAD, CABG X 3 10/09. Myoview low risk 10/10. EF >55% 2D 6/13.  Pre-operative clearance  Fracture of left hip after fall at home.  Aortic valve sclerosis, no stenosis  Carotid bruit present bilat, dopplers OK 6/13  UTI, just finnished 10 days of Cipro  Hx of dizziness, ? orthostatic in setting of UTI ? mild dehydration  Chronic renal insufficiency, stage III (moderate), SCr 1.4 Sept 2013  PAF/SSS  Pacemaker March 2010 (MDT)  DVT after previous hip surgery  Dysphagia, new post op, esophageal perforation by endoscopy  Status post hip hemiarthroplasty left hip by Dr Ronnie Derby 08/08/2012  Closed fracture of left distal radius s/p ORIF by Dr Burney Gauze 08/08/2012  S/P right total hip arthroplasty by Dr Noemi Chapel in 2009  Acute blood loss anemia  Plan: PT/OT WBAT, THA  posterior precautions  DVT Prophylaxis: SCDx72 hrs,   DISCHARGE PLAN: Skilled Nursing Facility/Rehab  DISCHARGE NEEDS: TPN for 2-3 weeks

## 2012-08-12 NOTE — Progress Notes (Signed)
[facility] SICU PROGRESS NOTE  2 Days Post-Op  S/P Procedure(s) (LRB): ESOPHAGOSCOPY (N/A) INCISION AND DRAINAGE (Right)   Clinically stable although Hgb/Hct significantly decreased No sign of ongoing blood loss Receiving blood at present  Plan: Agree with transfusion.  Will continue to follow intermittently but defer management of esophageal perforation to Dr Erik Obey.  Amy [name] H 08/12/2012 10:11 AM

## 2012-08-13 ENCOUNTER — Encounter (HOSPITAL_COMMUNITY): Payer: Self-pay | Admitting: Gastroenterology

## 2012-08-13 LAB — COMPREHENSIVE METABOLIC PANEL
Albumin: 1.8 g/dL — ABNORMAL LOW (ref 3.5–5.2)
BUN: 32 mg/dL — ABNORMAL HIGH (ref 6–23)
Calcium: 9 mg/dL (ref 8.4–10.5)
GFR calc Af Amer: 59 mL/min — ABNORMAL LOW (ref >90)
Glucose, Bld: 191 mg/dL — ABNORMAL HIGH (ref 70–99)
Sodium: 142 mEq/L (ref 135–145)
Total Protein: 5.1 g/dL — ABNORMAL LOW (ref 6.0–8.3)

## 2012-08-13 LAB — TYPE AND SCREEN
Antibody Screen: NEGATIVE
Unit division: 0

## 2012-08-13 LAB — DIFFERENTIAL
Basophils Absolute: 0 10*3/uL (ref 0.0–0.1)
Basophils Relative: 0 % (ref 0–1)
Lymphocytes Relative: 8 % — ABNORMAL LOW (ref 12–46)
Neutro Abs: 6.8 10*3/uL (ref 1.7–7.7)
Neutrophils Relative %: 83 % — ABNORMAL HIGH (ref 43–77)

## 2012-08-13 LAB — CBC
Hemoglobin: 9.6 g/dL — ABNORMAL LOW (ref 12.0–15.0)
MCHC: 33.2 g/dL (ref 30.0–36.0)
RBC: 3.28 MIL/uL — ABNORMAL LOW (ref 3.87–5.11)
WBC: 8.2 10*3/uL (ref 4.0–10.5)

## 2012-08-13 LAB — GLUCOSE, CAPILLARY: Glucose-Capillary: 166 mg/dL — ABNORMAL HIGH (ref 70–99)

## 2012-08-13 LAB — MAGNESIUM: Magnesium: 1.9 mg/dL (ref 1.5–2.5)

## 2012-08-13 LAB — PHOSPHORUS: Phosphorus: 2.7 mg/dL (ref 2.3–4.6)

## 2012-08-13 MED ORDER — INSULIN ASPART 100 UNIT/ML ~~LOC~~ SOLN
0.0000 [IU] | SUBCUTANEOUS | Status: DC
  Administered 2012-08-13 – 2012-08-14 (×8): 2 [IU] via SUBCUTANEOUS
  Administered 2012-08-14: via SUBCUTANEOUS
  Administered 2012-08-14: 2 [IU] via SUBCUTANEOUS
  Administered 2012-08-15 (×2): 1 [IU] via SUBCUTANEOUS
  Administered 2012-08-15: 2 [IU] via SUBCUTANEOUS
  Administered 2012-08-15 (×2): 1 [IU] via SUBCUTANEOUS
  Administered 2012-08-16 (×4): 2 [IU] via SUBCUTANEOUS
  Administered 2012-08-17: 1 [IU] via SUBCUTANEOUS
  Administered 2012-08-17: 2 [IU] via SUBCUTANEOUS
  Administered 2012-08-17: 1 [IU] via SUBCUTANEOUS
  Administered 2012-08-17 – 2012-08-18 (×5): 2 [IU] via SUBCUTANEOUS
  Administered 2012-08-18: 3 [IU] via SUBCUTANEOUS
  Administered 2012-08-18: 2 [IU] via SUBCUTANEOUS
  Administered 2012-08-18: 3 [IU] via SUBCUTANEOUS
  Administered 2012-08-18: 2 [IU] via SUBCUTANEOUS
  Administered 2012-08-18 – 2012-08-20 (×6): 1 [IU] via SUBCUTANEOUS
  Administered 2012-08-21 (×2): 2 [IU] via SUBCUTANEOUS
  Administered 2012-08-21 (×2): 1 [IU] via SUBCUTANEOUS
  Administered 2012-08-21: 2 [IU] via SUBCUTANEOUS
  Administered 2012-08-22: 1 [IU] via SUBCUTANEOUS
  Administered 2012-08-22: 2 [IU] via SUBCUTANEOUS
  Administered 2012-08-22 – 2012-08-23 (×4): 1 [IU] via SUBCUTANEOUS

## 2012-08-13 MED ORDER — ZINC TRACE METAL 1 MG/ML IV SOLN
INTRAVENOUS | Status: AC
  Administered 2012-08-13: 18:00:00 via INTRAVENOUS
  Filled 2012-08-13: qty 2000

## 2012-08-13 MED ORDER — FAT EMULSION 20 % IV EMUL
240.0000 mL | INTRAVENOUS | Status: AC
  Administered 2012-08-13: 240 mL via INTRAVENOUS
  Filled 2012-08-13: qty 250

## 2012-08-13 NOTE — Progress Notes (Signed)
Physical Therapy Treatment Patient Details Name: Amy [name] MRN: [id number] DOB: 01-Mar-1922 Today's Date: 08/13/2012 Time: [id number] PT Time Calculation (min): 32 min  PT Assessment / Plan / Recommendation Comments on Treatment Session  Pt s/p lt hip hemiarthroplasty and lt forearm ORIF.  Post op with esophageal tear and surgery 08/08/12 for that. Pt now NPO and on TNA.  Today pt with incr BP when sitting EOB (210/107). BP returned to baseline when returned to supine.    Follow Up Recommendations  Post acute inpatient     Does the patient have the potential to tolerate intense rehabilitation  No, Recommend SNF  Barriers to Discharge        Equipment Recommendations  Other (comment) (platform RW)    Recommendations for Other Services    Frequency Min 3X/week   Plan Discharge plan remains appropriate;Frequency remains appropriate    Precautions / Restrictions Precautions Precautions: Posterior Hip;Fall Restrictions Weight Bearing Restrictions: Yes LUE Weight Bearing: Weight bear through elbow only LLE Weight Bearing: Weight bearing as tolerated   Pertinent Vitals/Pain BP 210/107 sitting EOB.    Mobility  Bed Mobility Rolling Right: 1: +2 Total assist Rolling Right: Patient Percentage: 10% Supine to Sit: 1: +2 Total assist;HOB elevated Supine to Sit: Patient Percentage: 10% Sitting - Scoot to Edge of Bed: 1: +2 Total assist Sitting - Scoot to Edge of Bed: Patient Percentage: 10% Sit to Supine: 1: +2 Total assist Sit to Supine: Patient Percentage: 10% Transfers Transfer via Lift Equipment: [place] Details for Transfer Assistance: Used maxisky on long loops at knees and shoulder to  maintain hip precautions.    Exercises     PT Diagnosis:    PT Problem List:   PT Treatment Interventions:     PT Goals Acute Rehab PT Goals PT Goal: Supine/Side to Sit - Progress: Progressing toward goal PT Goal: Sit to Supine/Side - Progress: Progressing toward goal  Visit  Information  Last PT Received On: 08/13/12 Assistance Needed: +2 PT/OT Co-Evaluation/Treatment: Yes    Subjective Data  Subjective: Pt stating her nose feels stopped up.   Cognition  Arousal/Alertness: Lethargic Behavior During Session: Chief Technology Officer Sitting - Balance Support: Bilateral upper extremity supported Static Sitting - Level of Assistance: 4: Min assist Static Sitting - Comment/# of Minutes: 5 minutes EOB.  Limited by incr BP.  End of Session PT - End of Session Equipment Utilized During Treatment: Other (comment) (maxisky) Activity Tolerance: Patient limited by fatigue;Patient limited by pain;Treatment limited secondary to medical complications (Comment) (High BP in sitting) Patient left: in chair;with call bell/phone within reach Nurse Communication: Mobility status;Need for lift equipment   GP     Amy [name] 08/13/2012, 2:28 PM  [facility] PT [phone]

## 2012-08-13 NOTE — Progress Notes (Signed)
08/13/2012 8:39 AM  Amy [name], [name]  Post-Op Day 3    Temp:  [97.3 F (36.3 C)-98.3 F (36.8 C)] 97.9 F (36.6 C) (10/14 0727) Pulse Rate:  [77-105] 95  (10/14 0800) Resp:  [16-35] 17  (10/14 0800) BP: (123-169)/(47-70) 157/57 mmHg (10/14 0800) SpO2:  [96 %-100 %] 98 % (10/14 0800) Weight:  [76.5 kg (168 lb 10.4 oz)] 76.5 kg (168 lb 10.4 oz) (10/14 0600),     Intake/Output Summary (Last 24 hours) at 08/13/12 0839 Last data filed at 08/13/12 0800  Gross per 24 hour  Intake 3328.67 ml  Output   1801 ml  Net 1527.67 ml    Results for orders placed during the hospital encounter of 08/07/12 (from the past 24 hour(s))  GLUCOSE, CAPILLARY     Status: Abnormal   Collection Time   08/12/12 11:48 AM      Component Value Range   Glucose-Capillary 165 (*) 70 - 99 mg/dL  GLUCOSE, CAPILLARY     Status: Abnormal   Collection Time   08/12/12  5:37 PM      Component Value Range   Glucose-Capillary 136 (*) 70 - 99 mg/dL  GLUCOSE, CAPILLARY     Status: Abnormal   Collection Time   08/13/12 12:16 AM      Component Value Range   Glucose-Capillary 166 (*) 70 - 99 mg/dL  COMPREHENSIVE METABOLIC PANEL     Status: Abnormal   Collection Time   08/13/12  4:00 AM      Component Value Range   Sodium 142  135 - 145 mEq/L   Potassium 3.9  3.5 - 5.1 mEq/L   Chloride 105  96 - 112 mEq/L   CO2 30  19 - 32 mEq/L   Glucose, Bld 191 (*) 70 - 99 mg/dL   BUN 32 (*) 6 - 23 mg/dL   Creatinine, Ser 0.96  0.50 - 1.10 mg/dL   Calcium 9.0  8.4 - 10.5 mg/dL   Total Protein 5.1 (*) 6.0 - 8.3 g/dL   Albumin 1.8 (*) 3.5 - 5.2 g/dL   AST 33  0 - 37 U/L   ALT 14  0 - 35 U/L   Alkaline Phosphatase 221 (*) 39 - 117 U/L   Total Bilirubin 1.0  0.3 - 1.2 mg/dL   GFR calc non Af Amer 51 (*) >90 mL/min   GFR calc Af Amer 59 (*) >90 mL/min  MAGNESIUM     Status: Normal   Collection Time   08/13/12  4:00 AM      Component Value Range   Magnesium 1.9  1.5 - 2.5 mg/dL  PHOSPHORUS     Status: Normal   Collection Time   08/13/12  4:00 AM      Component Value Range   Phosphorus 2.7  2.3 - 4.6 mg/dL  DIFFERENTIAL     Status: Abnormal   Collection Time   08/13/12  4:00 AM      Component Value Range   Neutrophils Relative 83 (*) 43 - 77 %   Neutro Abs 6.8  1.7 - 7.7 K/uL   Lymphocytes Relative 8 (*) 12 - 46 %   Lymphs Abs 0.7  0.7 - 4.0 K/uL   Monocytes Relative 7  3 - 12 %   Monocytes Absolute 0.6  0.1 - 1.0 K/uL   Eosinophils Relative 2  0 - 5 %   Eosinophils Absolute 0.2  0.0 - 0.7 K/uL   Basophils Relative 0  0 -  1 %   Basophils Absolute 0.0  0.0 - 0.1 K/uL  CBC     Status: Abnormal   Collection Time   08/13/12  4:00 AM      Component Value Range   WBC 8.2  4.0 - 10.5 K/uL   RBC 3.28 (*) 3.87 - 5.11 MIL/uL   Hemoglobin 9.6 (*) 12.0 - 15.0 g/dL   HCT 28.9 (*) 36.0 - 46.0 %   MCV 88.1  78.0 - 100.0 fL   MCH 29.3  26.0 - 34.0 pg   MCHC 33.2  30.0 - 36.0 g/dL   RDW 15.8 (*) 11.5 - 15.5 %   Platelets 157  150 - 400 K/uL  GLUCOSE, CAPILLARY     Status: Abnormal   Collection Time   08/13/12  6:54 AM      Component Value Range   Glucose-Capillary 189 (*) 70 - 99 mg/dL    SUBJECTIVE:  Overall malaise.  OBJECTIVE:  Mucoid cloudy drainage.  Wound flat.  IMPRESSION:  Possible fluid overload per CXR.  Satisfactory control of mediastinitis.  PLAN:  Nutrition.  Agree with concept of PEG tube when possible.  Still prefer not to try to re-explore and find/fix esophageal perf unless she is simply not making progress in next 1-2 weeks.  Amy [name]

## 2012-08-13 NOTE — Progress Notes (Signed)
PARENTERAL NUTRITION CONSULT NOTE - FOLLOW UP  Pharmacy Consult for TPN Indication: Esophageal perforation, s/p esophagoscopy and I&D, plans for NPO  Allergies  Allergen Reactions  . Hydrocodone-Acetaminophen Itching    Tolerates tylenol    Patient Measurements: Height: 5\' 5"  (165.1 cm) Weight: 168 lb 10.4 oz (76.5 kg) IBW/kg (Calculated) : 57  Adjusted Body Weight: 65 Kg  Vital Signs: Temp: 97.9 F (36.6 C) (10/14 0727) Temp src: Oral (10/14 0727) BP: 165/55 mmHg (10/14 0900) Pulse Rate: 96  (10/14 0900) Intake/Output from previous day: 10/13 0701 - 10/14 0700 In: 3118.7 [I.V.:600; Blood:633; IV Piggyback:770.7; TPN:1115] Out: 2151 [Urine:2151] Intake/Output from this shift: Total I/O In: 350 [I.V.:105; IV Piggyback:50; TPN:195] Out: -   Labs:  Basename 08/13/12 0400 08/12/12 0730 08/12/12 0450  WBC 8.2 8.9 8.0  HGB 9.6* 6.1* 6.9*  HCT 28.9* 18.8* 21.1*  PLT 157 179 145*  APTT -- -- --  INR -- -- --     Basename 08/13/12 0400 08/12/12 0450 08/11/12 0528  NA 142 142 141  K 3.9 3.6 4.5  CL 105 108 107  CO2 30 29 26   GLUCOSE 191* 156* 165*  BUN 32* 28* 25*  CREATININE 0.96 0.98 1.16*  LABCREA -- -- --  CREAT24HRUR -- -- --  CALCIUM 9.0 9.2 9.2  MG 1.9 2.1 --  PHOS 2.7 1.9* --  PROT 5.1* 5.1* 5.7*  ALBUMIN 1.8* 1.8* 2.2*  AST 33 17 27  ALT 14 7 9   ALKPHOS 221* 92 121*  BILITOT 1.0 0.5 0.6  BILIDIR -- -- --  IBILI -- -- --  PREALBUMIN -- 4.3* 7.3*  TRIG -- 117 --  CHOLHDL -- -- --  CHOL -- 146 --   Estimated Creatinine Clearance: 41.4 ml/min (by C-G formula based on Cr of 0.96).    Basename 08/13/12 0654 08/13/12 0016 08/12/12 1737  GLUCAP 189* 166* 136*    Insulin Requirements in the past 24 hours:  No SSI ordered  Current Nutrition:  TPN  Nutritional Goals:  1550-1800 kCal, 77-90 grams of protein per day per RD assessment  Assessment:  Patient presented to [facility] s/p fall and fractures needing repair, she was transferred here for hip  and wrist fracture repair. S/p fracture repair she complained of odynophagia and underwent upper endoscopy per GI, which showed a contained esophageal perforation. She is s/p Incision and drainage, right neck and mediastinum.   GI: Currently NPO, plans to keep this way for now to allow healing of esophageal tear, plans for gastrograffin swallow in ~ 5-7 days. Noted patient has been without significant nutrients since 10/8 until TPN started 10/12. Moderate refeeding risk.  Endo: No hx DM, A1c 5.5% at BL, CBG elevated and some above goal (range 136-191) Lytes: Lytes WNL - no evidence of refeeding syndrome - received K runs x 3 and KPhos x 1 yesterday  Renal: Scr stable at 0.96, UOP 1.2 Pulm: 2L [place]  Cards: BP 165/55, HR 96 - continues on low dose IV lopressor  Hepatobil: LFTs, cholesterol and triglycerides are WNL, prealbumin low at 4.3 ID: [id number] empirically for esophageal perf. WBC wnl, afebrile.  Best Practices: SCDs, PPI IV TPN Access: Triple lumen PICC  TPN day#: 2  Plan:  - Continue Clinimix E 5/20 at 65cc/hr tonight (Provides goal protein of 78gm/day and average est Kcal of 1552 Kcal based on three times a week lipid supplementation) - Will supplement MVI, trace elements and IV fats on MWF only d/t ongoing Engineer, manufacturing systems.  - Start  SSI due to increasing CBGs - Will f/up BMET, Mg and Phos in AM  Salome Arnt, PharmD, BCPS Pager # [phone] 08/13/2012 9:32 AM

## 2012-08-13 NOTE — Progress Notes (Signed)
[facility] PROGRESS NOTE  Amy [name] [id number] DOB: 1922/01/27 DOA: 08/07/2012 PCP: Purvis Kilts, MD  Assessment/Plan: Principal Problem:  *Mediastinitis S/P esophageal perforation, I&D 10/11 Active Problems:  Radial head fracture, closed, Lt  Hypertension  Hyperlipidemia, statin intol.  CAD, CABG X 3 10/09. Myoview low risk 10/10. EF >55% 2D 6/13.  Pre-operative clearance  Fracture of left hip after fall at home.  Aortic valve sclerosis, no stenosis  Carotid bruit present bilat, dopplers OK 6/13  UTI, just finnished 10 days of Cipro  Hx of dizziness, ? orthostatic in setting of UTI ? mild dehydration  Chronic renal insufficiency, stage III (moderate), SCr 1.4 Sept 2013  PAF/SSS  Pacemaker March 2010 (MDT)  DVT after previous hip surgery  Dysphagia, new post op, esophageal perforation by endoscopy  Status post hip hemiarthroplasty left hip by Dr Ronnie Derby 08/08/2012  Closed fracture of left distal radius s/p ORIF by Dr Burney Gauze 08/08/2012  S/P right total hip arthroplasty by Dr Noemi Chapel in 2009  Acute blood loss anemia       Radial fracture/impacted hip fracture,, given extensive history including underlying coronary artery disease, status post CABG, pacemaker placement, CAD, CABG X 3 10/09. Myoview low risk 10/10. EF >55% 2D 6/13  cardiology clearance obtained, status post Open reduction and internal fixation, distal radius fracture  left distal radius, left carpal tunnel release. Awaiting follow up with Dr.Weingold next Tuesday for followup of her radial fracture , I did call him last week and requested him to come to the hospital to see the patient Fracture repaired by Dr. Lara Mulch    Esophageal perforation/Ent .[facility] surgery consulted  Status post endoscopy and confirmed esophageal perforation/CT scan shows extensive pneumomediastinum  Status post incision and drainage and right neck and many after transcervical approach and cervical esophagoscope by  Dr. [name], MD,  Must remain strict NPO  Started TNA - would d/c IV fluids once TNA started  Ultimately may be able to switch to gastric feeds  Continue IV Zosyn  Would wait at least 5-7 days before repeat gastrograffin study  As per ENT prefer not to try to re-explore and find/fix esophageal perf unless she is simply not making progress in next 1-2 weeks. May need a PEG tube during this hospitalization      Postop atelectasis - needs aggressive pulm toilet and mobility  Right ankle pain x-rays done yesterday did not show any obvious fracture  Paroxysmal A. fib now resolved, will remain high risk for postoperative atrial fibrillation, continue perioperative beta blocker , rate controlled  Hypertension holding ARB and hydrochlorothiazide, due to mild renal insufficiency , postoperative hypotension  ,  Recent UTI status post treatment with ciprofloxacin   Coronary artery disease, negative cardiac enzymes, history of Inferior STEMI 07/2008 - PCI of prox RCA followed byu CABG x 3 in 9/'09 , aspirin and Lovenox discontinued due to falling hemoglobin   Acute blood loss anemia hemoglobin has dropped to 6.9, status post transfusion of 2 units of packed red blood cells today   Dyslipidemia on niacin and fish which will be continued    Chronic kidney disease, stage III, creatinine at baseline, improving everyday    Code Status: full  Family Communication: family updated about patient's clinical progress  Disposition Plan: Plan is to discharge to skilled nursing facility next week, not stable for discharge as of yet    Brief narrative:  78 year old female who lives alone, who had a mechanical fall while going up the steps at her  front of her home. She did not experience any loss of consciousness. She denies any chest pain, palpitations. At that time she denied lightheadedness however, her daughter states her blood pressures been running in the low 100s recently and the patient is also  been having some dizziness. The patient is also been having some inner ear issues and has been on Antivert as needed.  The patient's last major surgery with a CABG 4 years ago along with a postoperative pacemaker. She states she does not get chest pains, no history of CHF. She does have some chronic shortness of breath for years which remains unchanged.  Enroute in the ambulance she did have a episode off A. fib with RVR heart rate to 120. She spontaneously reverted to sinus rhythm. Patient transferred from [facility]  Cardiology clearance was obtained prior to surgery  Dr. Burney Gauze operated upon her right radial fracture last week  Dr. Ronnie Derby operated upon her left hip, underwent open reduction internal fixation per Dr. Ronnie Derby  She underwent surgery after cardiology clearance  Postoperative course has been complicated by esophageal perforation    Dr. Saralyn Pilar hung was consulted and esophageal perforation was discovered  Since then patient has undergone transcervical drainage of esophageal perforation with mediastinitis  Has been made strict n.p.o., currently on TNA, on IV antibiotics, will need to repeat a Gastrografin study in 5-7 days  Anticipate a prolonged hospital course , may need a PEG tube during this hospitalization   Consultants:  Orthopedics  Cardiology  Gastroenterology  Thoracic surgery  ENT  HPI/Subjective:  Hemodynamically stable overnight   Objective: Filed Vitals:   08/13/12 0727 08/13/12 0800 08/13/12 0900 08/13/12 1000  BP:  157/57 165/55 153/56  Pulse:  95 96 96  Temp: 97.9 F (36.6 C)     TempSrc: Oral     Resp:  17 21 18   Height:      Weight:      SpO2:  98% 96% 98%    Intake/Output Summary (Last 24 hours) at 08/13/12 1019 Last data filed at 08/13/12 1000  Gross per 24 hour  Intake   2792 ml  Output   1901 ml  Net    891 ml    Exam:  HENT:  Head: Atraumatic.  Nose: Nose normal.  Mouth/Throat: Oropharynx is clear and moist.  Eyes: Conjunctivae  are normal. Pupils are equal, round, and reactive to light. No scleral icterus.  Neck: Neck supple. No tracheal deviation present. Neck covered with dressing Cardiovascular: Normal rate, regular rhythm, normal heart sounds and intact distal pulses.  Pulmonary/Chest: Effort normal and breath sounds normal. No respiratory distress.  Abdominal: Soft. Normal appearance and bowel sounds are normal. She exhibits no distension. There is no tenderness.  Musculoskeletal: She exhibits no edema and no tenderness.  Neurological: She is alert. No cranial nerve deficit.    Data Reviewed: Basic Metabolic Panel:  Lab [id number] 0400 08/12/12 0450 08/11/12 0528 08/10/12 0600 08/09/12 0520  NA 142 142 141 136 134*  K 3.9 3.6 4.5 4.3 4.8  CL 105 108 107 101 99  CO2 30 29 26 24 26   GLUCOSE 191* 156* 165* 107* 140*  BUN 32* 28* 25* 26* 24*  CREATININE 0.96 0.98 1.16* 1.18* 1.50*  CALCIUM 9.0 9.2 9.2 8.9 8.7  MG 1.9 2.1 -- -- --  PHOS 2.7 1.9* -- -- --    Liver Function Tests:  Lab 08/13/12 0400 08/12/12 0450 08/11/12 0528 08/07/12 1141  AST 33 17 27 33  ALT  14 7 9 21   ALKPHOS 221* 92 121* 87  BILITOT 1.0 0.5 0.6 0.3  PROT 5.1* 5.1* 5.7* 6.9  ALBUMIN 1.8* 1.8* 2.2* 3.7   No results found for this basename: LIPASE:5,AMYLASE:5 in the last 168 hours No results found for this basename: AMMONIA:5 in the last 168 hours  CBC:  Lab 08/13/12 0400 08/12/12 0730 08/12/12 0450 08/11/12 0528 08/10/12 0600 08/07/12 1141  WBC 8.2 8.9 8.0 10.0 11.7* --  NEUTROABS 6.8 -- 6.7 -- -- 4.2  HGB 9.6* 6.1* 6.9* 8.6* 8.6* --  HCT 28.9* 18.8* 21.1* 26.2* 26.2* --  MCV 88.1 91.7 91.3 91.3 90.3 --  PLT 157 179 145* 145* 143* --    Cardiac Enzymes: No results found for this basename: CKTOTAL:5,CKMB:5,CKMBINDEX:5,TROPONINI:5 in the last 168 hours BNP (last 3 results) No results found for this basename: PROBNP:3 in the last 8760 hours   CBG:  Lab 08/13/12 0654 08/13/12 0016 08/12/12 1737 08/12/12 1148 08/12/12  0012  GLUCAP 189* 166* 136* 165* 141*    Recent Results (from the past 240 hour(s))  URINE CULTURE     Status: Normal   Collection Time   08/07/12 11:24 AM      Component Value Range Status Comment   Specimen Description URINE, CATHETERIZED   Final    Special Requests NONE   Final    Culture  Setup Time 08/07/2012 16:50   Final    Colony Count NO GROWTH   Final    Culture NO GROWTH   Final    Report Status 08/08/2012 FINAL   Final   SURGICAL PCR SCREEN     Status: Normal   Collection Time   08/08/12  5:32 AM      Component Value Range Status Comment   MRSA, PCR NEGATIVE  NEGATIVE Final    Staphylococcus aureus NEGATIVE  NEGATIVE Final   WOUND CULTURE     Status: Normal (Preliminary result)   Collection Time   08/10/12 10:29 PM      Component Value Range Status Comment   Specimen Description WOUND ESOPHAGUS   Final    Special Requests POF ZOSYN   Final    Gram Stain PENDING   Incomplete    Culture     Final    Value: MULTIPLE ORGANISMS PRESENT, NONE PREDOMINANT NO STAPHYLOCOCCUS AUREUS ISOLATED NO GROUP A STREP (S.PYOGENES) ISOLATED   Report Status PENDING   Incomplete   ANAEROBIC CULTURE     Status: Normal (Preliminary result)   Collection Time   08/10/12 10:29 PM      Component Value Range Status Comment   Specimen Description WOUND ESOPHAGUS   Final    Special Requests PATIENT ON FOLLOWING ZOSYN   Final    Gram Stain PENDING   Incomplete    Culture     Final    Value: NO ANAEROBES ISOLATED; CULTURE IN PROGRESS FOR 5 DAYS   Report Status PENDING   Incomplete      Studies: Dg Chest 1 View  08/07/2012  *RADIOLOGY REPORT*  Clinical Data: Fall  CHEST - 1 VIEW  Comparison: 03/25/2012  Findings: Left subclavian sequential transvenous pacemaker leads project at right atrium and right ventricle. Upper normal heart size post CABG. Calcified tortuous aorta. Pulmonary vascularity normal. Lungs grossly clear. No pleural effusion or pneumothorax. Minimal biapical scarring. Diffuse  osseous demineralization.  IMPRESSION: Post CABG and pacemaker. No acute abnormalities.   Original Report Authenticated By: Burnetta Sabin, M.D.    Dg Wrist Complete Left  08/07/2012  *RADIOLOGY REPORT*  Clinical Data: Left wrist pain and swelling post fall  LEFT WRIST - COMPLETE 3+ VIEW  Comparison: 03/07/2005  Findings: Minimally displaced ulnar styloid fracture. Comminuted distal left radial metaphyseal fracture with intra- articular extension at the radiocarpal joint. Question extension into the distal radioulnar joint as well. Dorsal tilt of the distal radial articular surface. Overlying soft tissue swelling. Minimal degenerative changes at first [facility] joint. No additional acute fracture or dislocation identified. Osseous demineralization.  IMPRESSION: Ulnar styloid fracture. Angulated mildly displaced intra-articular distal left radial metaphyseal fracture. Osseous demineralization.   Original Report Authenticated By: Burnetta Sabin, M.D.    Dg Hip Complete Left  08/07/2012  *RADIOLOGY REPORT*  Clinical Data: Left hip pain post fall  LEFT HIP - COMPLETE 2+ VIEW  Comparison: None  Findings: Minimally impacted left femoral neck fracture. Osseous mineralization appears diffusely decreased. Left hip joint space preserved. No additional fracture or dislocation identified. Symmetric SI joints. Pelvis appears intact.  IMPRESSION: Minimally impacted left femoral neck fracture.   Original Report Authenticated By: Burnetta Sabin, M.D.    Ct Head Wo Contrast  08/07/2012  *RADIOLOGY REPORT*  Clinical Data:  Fall.  CT HEAD WITHOUT CONTRAST CT CERVICAL SPINE WITHOUT CONTRAST  Technique:  Multidetector CT imaging of the head and cervical spine was performed following the standard protocol without intravenous contrast.  Multiplanar CT image reconstructions of the cervical spine were also generated.  Comparison:   None  CT HEAD  Findings: No skull fracture or intracranial hemorrhage.  No CT evidence of large acute infarct.   Mild small vessel disease type changes.  No hydrocephalus.  No intracranial mass lesion detected on this unenhanced exam.  Vascular calcifications.  IMPRESSION: No skull fracture or intracranial hemorrhage.  CT CERVICAL SPINE  Findings: No cervical spine fracture.  Dextroscoliosis and degenerative changes.  Spinal stenosis most notable C4-5.  Biapical lung scarring.  Carotid calcifications.  IMPRESSION: No cervical spine fracture.  Cervical spondylotic changes with spinal stenosis most notable C4- 5.   Original Report Authenticated By: Doug Sou, M.D.    Ct Soft Tissue Neck W Contrast  08/11/2012  *RADIOLOGY REPORT*  Clinical Data: Esophageal perforation.  CT NECK WITH CONTRAST  Technique:  Multidetector CT imaging of the neck was performed with intravenous contrast.  Contrast: 37mL OMNIPAQUE IOHEXOL 300 MG/ML  SOLN  Comparison: CT of the cervical spine 08/07/2012.  Findings: Limited imaging of the brain is unremarkable.  Extensive retropharyngeal gas is dissecting upwards from the mediastinum.  There is a focal perforation of the esophagus at the level of the cricoid cartilage.  Oral contrast extravasates into the retropharyngeal space and communicates with the superior mediastinum.  Edematous changes and gas are present throughout the superior mediastinum.  Soft tissue gas perforate is about the larynx and thyroid, interdigitating with the strap musculature. There is also gas extending posterior to the carotid space on the right.  Atherosclerotic calcifications present at the aortic arch and at the carotid bifurcations bilaterally.  Of note, there is medial deviation of the right internal carotid artery within the retropharyngeal space with the artery is now surrounded by gas.  The larynx appears to be intact.  Vocal cords are symmetric.  No significant adenopathy is present.  The bone windows demonstrate multilevel spondylosis.  IMPRESSION:  1.  Esophageal perforation. The perforation appears to be at  the level of the cricoid cartilage 2.  Extensive retropharyngeal gas with perforation posterior to the carotid space on the right  and surrounding the thyroid gland and larynx. 3.  Medial deviation of the right internal carotid artery within the retropharyngeal space is surrounded by gas. 4.  Atherosclerosis. 5.  Moderate spondylosis of the cervical spine.   Original Report Authenticated By: Resa Miner. MATTERN, M.D.    Ct Chest W Contrast  08/10/2012  *RADIOLOGY REPORT*  Clinical Data: Neck pain.  Evaluate for possible perforated esophagus.  CT CHEST WITH CONTRAST  Technique:  Multidetector CT imaging of the chest was performed following the standard protocol during bolus administration of intravenous contrast.  Contrast:  80 ml of Omnipaque-300.  Comparison: No priors.  Findings:  Mediastinum: Images 30-32 of series 5 demonstrate an apparent defect in the posterior wall of the lower cervical esophagus with a large amount of oral contrast material extending from this defect into the adjacent posterior mediastinum.  This contrast material and multiple locules of gas tract inferiorly through the mediastinum to the right side of the esophagus to the level of the aortic arch, and multiple locules of gas extend in the fat around the carina.  There is also pneumomediastinum more anteriorly throughout the middle and anterior mediastinum, as well as tracking up into the soft tissues of the neck. A tiny amount of pneumomediastinum is also noted adjacent to the distal esophagus shortly above the esophageal hiatus.  Heart size is mildly enlarged. There is no significant pericardial fluid, thickening or pericardial calcification.  A left sided pacemaker device in place with leads terminating in the right atrium and right ventricular apex.There is atherosclerosis of the thoracic aorta, the great vessels of the mediastinum and the coronary arteries, including calcified atherosclerotic plaque in the left main, left anterior  descending, left circumflex and right coronary arteries. The patient is status post median sternotomy for CABG with a LIMA to the LAD.  There are also two other graft markers in place in the proximal descending thoracic aorta, but no patent bypass grafts are confidently identified on this non gated examination.  Lungs/Pleura: A small right pleural effusion and trace left pleural effusion, both of which are layering dependently.  Slight diffuse prominence of the interstitial markings.  No frank honeycombing or traction bronchiectasis identified to suggest underlying interstitial lung disease.  No acute consolidative airspace disease.  Mild bilateral apical pleuroparenchymal thickening, most compatible with scarring.  Upper Abdomen: Visualized portions are unremarkable.  Musculoskeletal: Sternotomy wires.  Mild deformity of several anterolateral right ribs likely reflects old healed rib fractures. There are no aggressive appearing lytic or blastic lesions noted in the visualized portions of the skeleton.  IMPRESSION: 1.  Findings are compatible with perforation of the distal cervical esophagus shortly above the level of the thoracic inlet with extravasation of oral contrast material into the posterior mediastinum, and extensive pneumomediastinum and cervical gas, as above. 2.  Small right and trace left pleural effusions layer dependently and are simple in appearance at this time. 3.  Mild cardiomegaly. 4. Atherosclerosis, including left main and three-vessel coronary artery disease.  The patient is status post CABG with a LIMA to the LAD.  Two additional graft markers are noted on the proximal ascending thoracic aorta, and no definite patent bypass grafts are seen extending from these graft markers at this time on this non gated CT examination.  These results were called by telephone on 08/10/2012 at 08:33 p.m. to Dr. Roxy Manns, who verbally acknowledged these results.   Original Report Authenticated By: Etheleen Mayhew, M.D.    Ct Cervical Spine Wo Contrast  08/07/2012  *RADIOLOGY REPORT*  Clinical Data:  Fall.  CT HEAD WITHOUT CONTRAST CT CERVICAL SPINE WITHOUT CONTRAST  Technique:  Multidetector CT imaging of the head and cervical spine was performed following the standard protocol without intravenous contrast.  Multiplanar CT image reconstructions of the cervical spine were also generated.  Comparison:   None  CT HEAD  Findings: No skull fracture or intracranial hemorrhage.  No CT evidence of large acute infarct.  Mild small vessel disease type changes.  No hydrocephalus.  No intracranial mass lesion detected on this unenhanced exam.  Vascular calcifications.  IMPRESSION: No skull fracture or intracranial hemorrhage.  CT CERVICAL SPINE  Findings: No cervical spine fracture.  Dextroscoliosis and degenerative changes.  Spinal stenosis most notable C4-5.  Biapical lung scarring.  Carotid calcifications.  IMPRESSION: No cervical spine fracture.  Cervical spondylotic changes with spinal stenosis most notable C4- 5.   Original Report Authenticated By: Doug Sou, M.D.    Dg Chest Port 1 View  08/12/2012  *RADIOLOGY REPORT*  Clinical Data: Mediastinal abscess.  Acute blood loss.  PORTABLE CHEST - 1 VIEW  Comparison: 08/11/2012.  Findings: Patient is rotated to the right.  Interval removal of enteric tube.  CABG.  Left subclavian pacemaker apparatus unchanged.  Right upper extremity PICC also appears unchanged. Mild basilar atelectasis.  No airspace disease.  Small left pleural effusion. Cardiopericardial silhouette and mediastinal contours appear unchanged compared to prior. Pulmonary vascular congestion is present.  IMPRESSION: Development of bilateral basilar atelectasis and a small left pleural effusion. Findings suggest volume overload.  Unchanged Penrose drain in the right thoracic inlet/neck base.   Original Report Authenticated By: Dereck Ligas, M.D.    Dg Chest Port 1 View  08/11/2012  *RADIOLOGY  REPORT*  Clinical Data: Line placement  PORTABLE CHEST - 1 VIEW  Comparison: 08/08/2012  Findings: There is a right-sided line entering the superior vena cava by subclavian approach.  The tip is at the cavoatrial junction.  There is no pneumothorax.  There is mild hazy density at the left lung base possibly representing atelectasis.  The lungs are otherwise clear.  An NG tube has been placed and just crosses the gastroesophageal junction.  Cardiac pacer is stable.  Heart size is normal.  IMPRESSION: NG tube and right central line placement.   Original Report Authenticated By: Rachael Fee, M.D.    Dg Chest Port 1 View  08/08/2012  *RADIOLOGY REPORT*  Clinical Data: Preoperative study.  Shortness of breath.  PORTABLE CHEST - 1 VIEW  Comparison: Chest x-ray 08/07/2012.  Findings: The lung volumes are normal.  No consolidative airspace disease.  No definite pleural effusions.  Bilateral apical pleuroparenchymal thickening, similar to prior examinations, most compatible with scarring.  Prominence of the central pulmonary arteries could suggest pulmonary arterial hypertension. Mild pulmonary venous congestion without frank pulmonary edema.  Heart size is mildly enlarged (unchanged). The patient is rotated to the right on today's exam, resulting in distortion of the mediastinal contours and reduced diagnostic sensitivity and specificity for mediastinal pathology.  Atherosclerotic calcifications are noted within the arch of the aorta.  Left-sided pacemaker device in place with lead tips projecting over the expected location of the right atrium and right ventricular apex.  IMPRESSION: 1.  Cardiomegaly with mild pulmonary venous congestion, but no frank pulmonary edema. 2.  In addition, there is dilatation of the central pulmonary arteries, which could suggest pulmonary arterial hypertension. 3.  Atherosclerosis. 4.  Postoperative changes and support apparatus, as above.  Original Report Authenticated By: Etheleen Mayhew, M.D.    Dg Shoulder Left  08/08/2012  *RADIOLOGY REPORT*  Clinical Data: Pain.  Recent injury.  LEFT SHOULDER - 2+ VIEW  Comparison: None.  Findings: No evidence of fracture or dislocation.  No significant degenerative change of the glenohumeral joint.  Ordinary osteoarthritis of the AC joint.  Pacemaker overlies the region. There are old appearing left rib fractures, not primarily evaluated.  IMPRESSION: No evidence of fracture or dislocation.  Osteoarthritis of the [facility] joint.   Original Report Authenticated By: Jules Schick, M.D.    Dg Foot Complete Right  08/09/2012  *RADIOLOGY REPORT*  Clinical Data: Right foot pain.  Postop surgery for fracture left hip and left radial fracture.  RIGHT FOOT COMPLETE - 3+ VIEW  Comparison: Radiographs 08/06/2011.  Findings: There is motion on the AP view.  Mild osteopenia is noted.  The previously noted fracture involving the base of the fifth metatarsal has healed.  There is no evidence of acute fracture or dislocation.  There are stable degenerative changes of the first metatarsal phalangeal joint.  The tibial sesamoid of the first metatarsal is bipartite.  IMPRESSION: No acute osseous findings.   Original Report Authenticated By: Vivia Ewing, M.D.    Dg Esophagus W/water Sol Cm  08/10/2012  *RADIOLOGY REPORT*  Clinical Data: Elenora Gamma with ulceration or perforation of the esophagus just below the upper esophageal sphincter on endoscopy today.  ESOPHOGRAM/BARIUM SWALLOW  Technique:  Single contrast examination was performed using water soluble contrast (Omnipaque-300).  Fluoroscopy time:  1.1 minutes.  Comparison:  Chest radiographs 08/08/2012.  Chest CT 08/15/2008.  Findings:  The patient has limited mobility due to a recent left hip fracture.  The study was initiated with the patient in the right posterior oblique position. The cervical esophagus appears normal.  Just below the thoracic inlet, there is focal extravasation of contrast into the  right superior mediastinum. With additional swallowing in the supine position, this became better defined, but appears contained.  There is no extravasation into the pleural space.  The distal esophagus was incompletely evaluated but appears grossly normal.  Some contrast enters the stomach.  IMPRESSION: Contained perforation of the proximal thoracic esophagus with extraluminal contrast collection in the superior right mediastinum. In review of the prior chest CT, there was no evidence of underlying esophageal diverticulum in this area.  Results have been reviewed with Dr. Benson Norway shortly after the procedure.   Original Report Authenticated By: Vivia Ewing, M.D.     Scheduled Meds:   . furosemide      . furosemide  20 mg Intravenous Once  . insulin aspart  0-9 Units Subcutaneous Q4H  . metoprolol  2.5 mg Intravenous Q8H  . pantoprazole (PROTONIX) IV  40 mg Intravenous Q12H  . piperacillin-tazobactam (ZOSYN)  IV  3.375 g Intravenous Q8H  . potassium chloride  10 mEq Intravenous Q1 Hr x 3  . potassium phosphate IVPB (mmol)  20 mmol Intravenous Once  . sodium chloride  10-40 mL Intracatheter Q12H  . sodium chloride  3 mL Intravenous Q12H   Continuous Infusions:   . sodium chloride    . sodium chloride 35 mL/hr at 08/13/12 0740  . fat emulsion    . TPN (CLINIMIX) +/- additives 40 mL/hr at 08/11/12 1736  . TPN (CLINIMIX) +/- additives 65 mL/hr at 08/12/12 1750  . TPN (CLINIMIX) +/- additives      Principal Problem:  *Mediastinitis S/P esophageal perforation, I&D 10/11 Active  Problems:  Radial head fracture, closed, Lt  Hypertension  Hyperlipidemia, statin intol.  CAD, CABG X 3 10/09. Myoview low risk 10/10. EF >55% 2D 6/13.  Pre-operative clearance  Fracture of left hip after fall at home.  Aortic valve sclerosis, no stenosis  Carotid bruit present bilat, dopplers OK 6/13  UTI, just finnished 10 days of Cipro  Hx of dizziness, ? orthostatic in setting of UTI ? mild dehydration   Chronic renal insufficiency, stage III (moderate), SCr 1.4 Sept 2013  PAF/SSS  Pacemaker March 2010 (MDT)  DVT after previous hip surgery  Dysphagia, new post op, esophageal perforation by endoscopy  Status post hip hemiarthroplasty left hip by Dr Ronnie Derby 08/08/2012  Closed fracture of left distal radius s/p ORIF by Dr Burney Gauze 08/08/2012  S/P right total hip arthroplasty by Dr Noemi Chapel in 2009  Acute blood loss anemia    Time spent: 40 minutes   [facility] [place] [facility] Pager [phone]. If 8PM-8AM, please contact night-coverage at [email or web address], password [facility] 08/13/2012, 10:19 AM  LOS: 6 days

## 2012-08-13 NOTE — Progress Notes (Signed)
Clinical Social Work CSW had phone call with pt's daughter, Juliann Pulse [phone]), to offer support and initiate relationship for discharge planning.  Juliann Pulse had gone home this afternoon for rest.  She appreciated CSW support.  CSW will meet with Juliann Pulse on unit tomorrow.

## 2012-08-13 NOTE — Progress Notes (Signed)
Patient was scheduled for d/c to the [facility] on Sat 08/11/12- but is she was transferred to 2300.  Notified Terri Bauert, LCSW and pass off report provided.  Also notified Mercedes of patient's move to 2300.  I will sign off.  Lorie Phenix. [place], [place]

## 2012-08-13 NOTE — Progress Notes (Signed)
ANTIBIOTIC CONSULT NOTE - FOLLOW UP  Pharmacy Consult for Zosyn Indication: Empiric coverage in setting of esophageal perforation + mediastinitis  Allergies  Allergen Reactions  . Hydrocodone-Acetaminophen Itching    Tolerates tylenol    Patient Measurements: Height: 5\' 5"  (165.1 cm) Weight: 168 lb 10.4 oz (76.5 kg) IBW/kg (Calculated) : 57   Vital Signs: Temp: 97.9 F (36.6 C) (10/14 0727) Temp src: Oral (10/14 0727) BP: 178/72 mmHg (10/14 1100) Pulse Rate: 103  (10/14 1100) Intake/Output from previous day: 10/13 0701 - 10/14 0700 In: 3118.7 [I.V.:600; Blood:633; IV Piggyback:770.7; TPN:1115] Out: 2151 [Urine:2151] Intake/Output from this shift: Total I/O In: 550 [I.V.:175; IV Piggyback:50; TPN:325] Out: 100 [Urine:100]  Labs:  [facility] 08/13/12 0400 08/12/12 0730 08/12/12 0450 08/11/12 0528  WBC 8.2 8.9 8.0 --  HGB 9.6* 6.1* 6.9* --  PLT 157 179 145* --  LABCREA -- -- -- --  CREATININE 0.96 -- 0.98 1.16*   Estimated Creatinine Clearance: 41.4 ml/min (by C-G formula based on Cr of 0.96). No results found for this basename: VANCOTROUGH:2,VANCOPEAK:2,VANCORANDOM:2,GENTTROUGH:2,GENTPEAK:2,GENTRANDOM:2,TOBRATROUGH:2,TOBRAPEAK:2,TOBRARND:2,AMIKACINPEAK:2,AMIKACINTROU:2,AMIKACIN:2, in the last 72 hours   Microbiology: Recent Results (from the past 720 hour(s))  URINE CULTURE     Status: Normal   Collection Time   08/07/12 11:24 AM      Component Value Range Status Comment   Specimen Description URINE, CATHETERIZED   Final    Special Requests NONE   Final    Culture  Setup Time 08/07/2012 16:50   Final    Colony Count NO GROWTH   Final    Culture NO GROWTH   Final    Report Status 08/08/2012 FINAL   Final   SURGICAL PCR SCREEN     Status: Normal   Collection Time   08/08/12  5:32 AM      Component Value Range Status Comment   MRSA, PCR NEGATIVE  NEGATIVE Final    Staphylococcus aureus NEGATIVE  NEGATIVE Final   WOUND CULTURE     Status: Normal (Preliminary result)    Collection Time   08/10/12 10:29 PM      Component Value Range Status Comment   Specimen Description WOUND ESOPHAGUS   Final    Special Requests POF ZOSYN   Final    Gram Stain PENDING   Incomplete    Culture     Final    Value: MULTIPLE ORGANISMS PRESENT, NONE PREDOMINANT NO STAPHYLOCOCCUS AUREUS ISOLATED NO GROUP A STREP (S.PYOGENES) ISOLATED   Report Status PENDING   Incomplete   ANAEROBIC CULTURE     Status: Normal (Preliminary result)   Collection Time   08/10/12 10:29 PM      Component Value Range Status Comment   Specimen Description WOUND ESOPHAGUS   Final    Special Requests PATIENT ON FOLLOWING ZOSYN   Final    Gram Stain PENDING   Incomplete    Culture     Final    Value: NO ANAEROBES ISOLATED; CULTURE IN PROGRESS FOR 5 DAYS   Report Status PENDING   Incomplete     Anti-infectives     Start     Dose/Rate Route Frequency Ordered Stop   08/10/12 1830  piperacillin-tazobactam (ZOSYN) IVPB 3.375 g       3.375 g 12.5 mL/hr over 240 Minutes Intravenous 3 times per day 08/10/12 1730            Assessment: 78 y.o. F found to have esophageal perforation on upper endoscopy this admission along with mediastinitis, now s/p I&D. Cultures are  still pending and show multiple organisms, none predominant. Pt remains afebrile, WBC WNL, renal function has been stable. The patient continues on empiric Zosyn -- dose remains appropriate.  Goal of Therapy:  Proper antibiotics for infection/cultures adjusted for renal/hepatic function   Plan:  1. Continue Zosyn 3.375g IV every 8 hours 2. Will continue to follow renal function, culture results, LOT, and antibiotic de-escalation plans   Alycia Rossetti, PharmD, BCPS Clinical Pharmacist Pager: [phone] 08/13/2012 11:26 AM

## 2012-08-13 NOTE — Progress Notes (Signed)
   CARDIOTHORACIC SURGERY PROGRESS NOTE   R3 Days Post-Op Procedure(s) (LRB): ESOPHAGOSCOPY (N/A) INCISION AND DRAINAGE (Right)  Subjective: Doesn't feel well.  No specific complaints.  Denies pain, SOB  Objective: Vital signs: BP Readings from Last 1 Encounters:  08/13/12 157/57   Pulse Readings from Last 1 Encounters:  08/13/12 95   Resp Readings from Last 1 Encounters:  08/13/12 17   Temp Readings from Last 1 Encounters:  08/13/12 97.9 F (36.6 C) Oral    Hemodynamics:    Physical Exam:  Rhythm:   sinus  Breath sounds: Diminished at bases  Heart sounds:  RRR  Incisions:  Appropriate serous drainage from penrose drains  Abdomen:  Soft, non-distended, non-tender  Extremities:  Warm, well-perfused   Intake/Output from previous day: 10/13 0701 - 10/14 0700 In: 3118.7 [I.V.:600; Blood:633; IV Piggyback:770.7; TPN:1115] Out: 2151 [Urine:2151] Intake/Output this shift: Total I/O In: 250 [I.V.:70; IV Piggyback:50; TPN:130] Out: -   Lab Results:  Basename 08/13/12 0400 08/12/12 0730  WBC 8.2 8.9  HGB 9.6* 6.1*  HCT 28.9* 18.8*  PLT 157 179   BMET:  Basename 08/13/12 0400 08/12/12 0450  NA 142 142  K 3.9 3.6  CL 105 108  CO2 30 29  GLUCOSE 191* 156*  BUN 32* 28*  CREATININE 0.96 0.98  CALCIUM 9.0 9.2    CBG (last 3)   Basename 08/13/12 0654 08/13/12 0016 08/12/12 1737  GLUCAP 189* 166* 136*   ABG    Component Value Date/Time   PHART 7.427* 08/07/2008 0515   HCO3 23.6 08/07/2008 0515   TCO2 26 08/07/2008 1658   ACIDBASEDEF 1.0 08/07/2008 0515   O2SAT 99.0 08/07/2008 0515   CXR: n/a  Assessment/Plan: S/P Procedure(s) (LRB): ESOPHAGOSCOPY (N/A) INCISION AND DRAINAGE (Right)  Overall stable Appropriate rise in Hgb following transfusion Hypertensive and relatively tachycardic Expected post op volume excess, diuresing   Consider increase beta blocker - defer to medical team  Would favor keeping I/O's slightly negative, record daily  weights  Continue IV Zosyn for at least 7 days  Continue strict NPO  TNA for nutritional support for now  Consider PEG placement if persistent leak present on f/u gastrograffin swallow later this week  Needs PT/OT and considerable encouragement  Will continue to follow   Amy [name] 08/13/2012 8:19 AM

## 2012-08-13 NOTE — Progress Notes (Signed)
Occupational Therapy Treatment Patient Details Name: [place] M Fluegel MRN: [id number] DOB: Mar 19, 1922 Today's Date: 08/13/2012 Time: [id number] OT Time Calculation (min): 32 min  OT Assessment / Plan / Recommendation Comments on Treatment Session Pt. limited by increase in BP when seated EOB.  Pt. returned to supine and BP improved, then was lifted to recliner via maxi move    Follow Up Recommendations  Skilled nursing facility    Barriers to Discharge       Equipment Recommendations  None recommended by OT    Recommendations for Other Services    Frequency Min 2X/week   Plan Discharge plan remains appropriate    Precautions / Restrictions Precautions Precautions: Posterior Hip;Fall Precaution Booklet Issued: Yes (comment) Precaution Comments: Educated pt on 3/3 posterior hip precautions Restrictions Weight Bearing Restrictions: Yes LUE Weight Bearing: Weight bear through elbow only LLE Weight Bearing: Weight bearing as tolerated   Pertinent Vitals/Pain     ADL  ADL Comments: Pt. moved to EOB with total A +2.  Pt able to maintain static sitting with min guard assist.  Unable to perform grooming EOB due to increased BP.  Pt. returned to supine, with improved BP, and was lifted to recliner via maxi sky    OT Diagnosis:    OT Problem List:   OT Treatment Interventions:     OT Goals ADL Goals ADL Goal: Grooming - Progress: Progressing toward goals Pt Will Perform Upper Body Dressing: with set-up;Sitting, chair;Sitting, bed;Unsupported Pt Will Perform Lower Body Dressing: with min assist;Sit to stand from chair;Sit to stand from bed;Supported;with adaptive equipment Pt Will Transfer to Toilet: with min assist;Ambulation;with DME;Comfort height toilet;Maintaining hip precautions Pt Will Perform Toileting - Clothing Manipulation: with supervision;Sitting on 3-in-1 or toilet;Standing Pt Will Perform Toileting - Hygiene: with supervision;Sit to stand from 3-in-1/toilet;Sitting on  3-in-1 or toilet Arm Goals Additional Arm Goal #1: Pt will independently perform Left digit, elbow and shoulder AROM to maintain full ROM. Miscellaneous OT Goals Miscellaneous OT Goal #1: Pt will perform bed mobility with min assist in prep for EOB ADLs. OT Goal: Miscellaneous Goal #1 - Progress: Progressing toward goals Miscellaneous OT Goal #2: Pt will independently verbalize and generalize 3/3 posterior hip precautions during all ADLs. OT Goal: Miscellaneous Goal #2 - Progress: Progressing toward goals  Visit Information  Last OT Received On: 08/13/12 Assistance Needed: +2    Subjective Data      Prior Functioning       Cognition  Overall Cognitive Status: Appears within functional limits for tasks assessed/performed Arousal/Alertness: Lethargic Orientation Level: Appears intact for tasks assessed Behavior During Session: Lethargic    Mobility  Shoulder Instructions Bed Mobility Rolling Right: 1: +2 Total assist Rolling Right: Patient Percentage: 10% Rolling Left: 1: +2 Total assist Rolling Left: Patient Percentage: 10% Supine to Sit: 1: +2 Total assist;HOB elevated Supine to Sit: Patient Percentage: 10% Sitting - Scoot to Edge of Bed: 1: +2 Total assist Sitting - Scoot to Edge of Bed: Patient Percentage: 10% Sit to Supine: 1: +2 Total assist Sit to Supine: Patient Percentage: 10% Details for Bed Mobility Assistance: Pt. requires assist for all aspects Transfers Transfers: Not assessed Transfer via Lift Equipment: [place] Details for Transfer Assistance: Used maxisky on long loops at knees and shoulder to  maintain hip precautions.       Exercises      Balance Balance Balance Assessed: Yes Static Sitting Balance Static Sitting - Balance Support: Right upper extremity supported;Feet supported Static Sitting - Level of Assistance: 4:  Min assist Static Sitting - Comment/# of Minutes: 5 mins EOB.  Limited by Increased BP.  Pt. progressed to min guard assist     End of Session OT - End of Session Equipment Utilized During Treatment: Other (comment) (maxi sky) Activity Tolerance: Treatment limited secondary to medical complications (Comment) Patient left: in chair;with call bell/phone within reach Nurse Communication: Mobility status;Patient requests pain meds;Other (comment) (increased BP)  GO     Malayna Noori M 08/13/2012, 5:35 PM

## 2012-08-13 NOTE — Progress Notes (Signed)
Amy Mulch, MD   Amy Spry, PA-C Amy [name], [place], [place]  [address]                             [phone]   PROGRESS NOTE  Subjective:  negative for Chest Pain  negative for Shortness of Breath  negative for Nausea/Vomiting   negative for Calf Pain  negative for Bowel Movement   Tolerating Diet: yes         Patient reports pain as 5 on 0-10 scale.    Objective: Vital signs in last 24 hours:   Patient Vitals for the past 24 hrs:  BP Temp Temp src Pulse Resp SpO2 Weight  08/13/12 0900 165/55 mmHg - - 96  21  96 % -  08/13/12 0800 157/57 mmHg - - 95  17  98 % -  08/13/12 0727 - 97.9 F (36.6 C) Oral - - - -  08/13/12 0700 142/57 mmHg - - 95  16  98 % -  08/13/12 0600 151/60 mmHg - - 98  22  98 % 76.5 kg (168 lb 10.4 oz)  08/13/12 0500 147/50 mmHg - - 92  17  97 % -  08/13/12 0400 136/52 mmHg 98 F (36.7 C) Oral 95  17  97 % -  08/13/12 0300 132/53 mmHg - - 95  17  96 % -  08/13/12 0200 155/47 mmHg - - 102  19  98 % -  08/13/12 0100 136/56 mmHg - - 98  19  98 % -  08/13/12 0000 147/55 mmHg 98 F (36.7 C) Oral 88  18  97 % -  08/12/12 2300 147/63 mmHg - - 88  21  100 % -  08/12/12 2200 146/55 mmHg - - 102  35  100 % -  08/12/12 2100 138/59 mmHg - - 95  16  100 % -  08/12/12 2000 153/59 mmHg 98.2 F (36.8 C) Oral 98  22  99 % -  08/12/12 1900 142/56 mmHg - - 100  19  100 % -  08/12/12 1800 159/70 mmHg - - 93  22  99 % -  08/12/12 1700 169/63 mmHg - - 90  19  99 % -  08/12/12 1600 155/64 mmHg - - 100  22  100 % -  08/12/12 1557 - 97.6 F (36.4 C) Oral - - - -  08/12/12 1530 169/67 mmHg 98.3 F (36.8 C) Oral - 18  - -  08/12/12 1500 157/62 mmHg 98.2 F (36.8 C) Oral 102  22  100 % -  08/12/12 1400 148/61 mmHg 98.3 F (36.8 C) Oral 97  26  98 % -  08/12/12 1300 134/55 mmHg - - 105  29  98 % -  08/12/12 1245 - 98.3 F (36.8 C) Oral 99  18  - -  08/12/12 1200 123/47 mmHg 98.1 F (36.7 C) Oral 88  26  97 % -  08/12/12 1146 - 98.1 F (36.7 C)  Oral - - - -  08/12/12 1145 139/53 mmHg - - 77  19  - -  08/12/12 1130 139/47 mmHg - - 97  24  - -  08/12/12 1115 - - - 99  22  - -  08/12/12 1100 165/59 mmHg 97.4 F (36.3 C) Axillary 90  22  98 % -  08/12/12 1045 - - - 87  22  - -  08/12/12 1030 169/63 mmHg - -  92  27  98 % -  08/12/12 1015 159/59 mmHg 97.4 F (36.3 C) Axillary 86  27  99 % -  08/12/12 1000 159/69 mmHg 97.3 F (36.3 C) Axillary 91  24  99 % -    @flow {1959:LAST@   Intake/Output from previous day:   10/13 0701 - 10/14 0700 In: 3118.7 [I.V.:600] Out: 2151 [Urine:2151]   Intake/Output this shift:   10/14 0701 - 10/14 1900 In: 350 [I.V.:105] Out: -    Intake/Output      10/13 0701 - 10/14 0700 10/14 0701 - 10/15 0700   I.V. (mL/kg) 600 (7.8) 105 (1.4)   Blood 633    NG/GT     IV Piggyback 770.7 50   TPN 1115 195   Total Intake(mL/kg) 3118.7 (40.8) 350 (4.6)   Urine (mL/kg/hr) 2151 (1.2)    Emesis/NG output     Total Output 2151    Net +967.7 +350           LABORATORY DATA:  Basename 08/13/12 0400 08/12/12 0730 08/12/12 0450 08/11/12 0528 08/10/12 0600 08/09/12 0520 08/08/12 0600  WBC 8.2 8.9 8.0 10.0 11.7* 14.1* 7.0  HGB 9.6* 6.1* 6.9* 8.6* 8.6* 9.9* 11.5*  HCT 28.9* 18.8* 21.1* 26.2* 26.2* 29.8* 34.3*  PLT 157 179 145* 145* 143* 161 166    Basename 08/13/12 0400 08/12/12 0450 08/11/12 0528 08/10/12 0600 08/09/12 0520 08/08/12 0600 08/07/12 1141  NA 142 142 141 136 134* 134* 136  K 3.9 3.6 4.5 4.3 4.8 4.3 3.8  CL 105 108 107 101 99 99 98  CO2 30 29 26 24 26 25 29   BUN 32* 28* 25* 26* 24* 23 24*  CREATININE 0.96 0.98 1.16* 1.18* 1.50* 1.32* 1.45*  GLUCOSE 191* 156* 165* 107* 140* 96 102*  CALCIUM 9.0 9.2 9.2 8.9 8.7 9.6 10.2   Lab Results  Component Value Date   INR 0.94 08/07/2012   INR 2.5* 08/18/2008   INR 2.5* 08/17/2008    Examination:  General appearance: alert, cooperative and no distress Extremities: Homans sign is negative, no sign of DVT Bruising on left elbow  Wound Exam:  clean, dry, intact   Drainage:  None: wound tissue dry  Motor Exam: EHL and FHL Intact  Sensory Exam: Deep Peroneal normal  Vascular Exam:    Assessment:    3 Days Post-Op  Procedure(s) (LRB): ESOPHAGOSCOPY (N/A) INCISION AND DRAINAGE (Right)  ADDITIONAL DIAGNOSIS:  Principal Problem:  *Mediastinitis S/P esophageal perforation, I&D 10/11 Active Problems:  Radial head fracture, closed, Lt  Hypertension  Hyperlipidemia, statin intol.  CAD, CABG X 3 10/09. Myoview low risk 10/10. EF >55% 2D 6/13.  Pre-operative clearance  Fracture of left hip after fall at home.  Aortic valve sclerosis, no stenosis  Carotid bruit present bilat, dopplers OK 6/13  UTI, just finnished 10 days of Cipro  Hx of dizziness, ? orthostatic in setting of UTI ? mild dehydration  Chronic renal insufficiency, stage III (moderate), SCr 1.4 Sept 2013  PAF/SSS  Pacemaker March 2010 (MDT)  DVT after previous hip surgery  Dysphagia, new post op, esophageal perforation by endoscopy  Status post hip hemiarthroplasty left hip by Dr Ronnie Derby 08/08/2012  Closed fracture of left distal radius s/p ORIF by Dr Burney Gauze 08/08/2012  S/P right total hip arthroplasty by Dr Noemi Chapel in 2009  Acute blood loss anemia  r hip hemi post op day 6, r wrist orif post op day6   Plan: Physical Therapy as ordered Weight Bearing as Tolerated (WBAT),  pain control  DVT Prophylaxis:  Lovenox 08/22/12 stop date  DISCHARGE PLAN: Skilled Nursing Facility/Rehab  DISCHARGE NEEDS:          Amy [name] 08/13/2012, 9:43 AM

## 2012-08-14 ENCOUNTER — Encounter (HOSPITAL_COMMUNITY): Payer: Self-pay

## 2012-08-14 DIAGNOSIS — J9601 Acute respiratory failure with hypoxia: Secondary | ICD-10-CM | POA: Diagnosis present

## 2012-08-14 DIAGNOSIS — I2510 Atherosclerotic heart disease of native coronary artery without angina pectoris: Secondary | ICD-10-CM

## 2012-08-14 DIAGNOSIS — D62 Acute posthemorrhagic anemia: Secondary | ICD-10-CM

## 2012-08-14 DIAGNOSIS — J9600 Acute respiratory failure, unspecified whether with hypoxia or hypercapnia: Secondary | ICD-10-CM

## 2012-08-14 DIAGNOSIS — J811 Chronic pulmonary edema: Secondary | ICD-10-CM | POA: Diagnosis not present

## 2012-08-14 LAB — GLUCOSE, CAPILLARY
Glucose-Capillary: 174 mg/dL — ABNORMAL HIGH (ref 70–99)
Glucose-Capillary: 160 mg/dL — ABNORMAL HIGH (ref 70–99)
Glucose-Capillary: 191 mg/dL — ABNORMAL HIGH (ref 70–99)
Glucose-Capillary: 172 mg/dL — ABNORMAL HIGH (ref 70–99)

## 2012-08-14 LAB — BASIC METABOLIC PANEL
CO2: 32 mEq/L (ref 19–32)
Calcium: 9.6 mg/dL (ref 8.4–10.5)
GFR calc non Af Amer: 61 mL/min — ABNORMAL LOW (ref >90)
Potassium: 3.7 mEq/L (ref 3.5–5.1)
Sodium: 143 mEq/L (ref 135–145)

## 2012-08-14 LAB — PHOSPHORUS: Phosphorus: 2.2 mg/dL — ABNORMAL LOW (ref 2.3–4.6)

## 2012-08-14 LAB — CBC
Hemoglobin: 9.1 g/dL — ABNORMAL LOW (ref 12.0–15.0)
MCH: 30.4 pg (ref 26.0–34.0)
MCV: 117.4 fL — ABNORMAL HIGH (ref 78.0–100.0)
Platelets: 171 10*3/uL (ref 150–400)
RBC: 2.99 MIL/uL — ABNORMAL LOW (ref 3.87–5.11)
WBC: 7.2 10*3/uL (ref 4.0–10.5)

## 2012-08-14 LAB — WOUND CULTURE

## 2012-08-14 LAB — MAGNESIUM: Magnesium: 1.9 mg/dL (ref 1.5–2.5)

## 2012-08-14 MED ORDER — BACITRACIN ZINC 500 UNIT/GM EX OINT
1.0000 "application " | TOPICAL_OINTMENT | CUTANEOUS | Status: DC | PRN
  Filled 2012-08-14: qty 15
  Filled 2012-08-14: qty 0.9

## 2012-08-14 MED ORDER — BACITRACIN ZINC 500 UNIT/GM EX OINT
1.0000 "application " | TOPICAL_OINTMENT | Freq: Two times a day (BID) | CUTANEOUS | Status: DC
  Administered 2012-08-14 – 2012-08-23 (×18): 1 via TOPICAL
  Filled 2012-08-14 (×2): qty 0.9

## 2012-08-14 MED ORDER — LORAZEPAM 2 MG/ML IJ SOLN: 1.0000 mg | Freq: Three times a day (TID) | INTRAMUSCULAR | Status: DC | PRN

## 2012-08-14 MED ORDER — CHLORHEXIDINE GLUCONATE 0.12 % MT SOLN
5.0000 mL | Freq: Four times a day (QID) | OROMUCOSAL | Status: DC
  Administered 2012-08-14 (×2): 5 mL via OROMUCOSAL
  Administered 2012-08-14: 20:00:00 via OROMUCOSAL
  Administered 2012-08-15 (×3): 5 mL via OROMUCOSAL
  Administered 2012-08-15: 16:00:00 via OROMUCOSAL
  Administered 2012-08-16 – 2012-08-21 (×21): 5 mL via OROMUCOSAL
  Administered 2012-08-21 – 2012-08-22 (×3): via OROMUCOSAL
  Administered 2012-08-22 – 2012-08-23 (×3): 5 mL via OROMUCOSAL
  Administered 2012-08-23: 14:00:00 via OROMUCOSAL
  Filled 2012-08-14 (×40): qty 15

## 2012-08-14 MED ORDER — CLINIMIX E/DEXTROSE (5/20) 5 % IV SOLN
INTRAVENOUS | Status: AC
  Administered 2012-08-14: 17:00:00 via INTRAVENOUS
  Filled 2012-08-14: qty 2000

## 2012-08-14 MED ORDER — FENTANYL CITRATE 0.05 MG/ML IJ SOLN: 50.0000 ug | Freq: Once | INTRAMUSCULAR | Status: DC

## 2012-08-14 MED ORDER — SODIUM GLYCEROPHOSPHATE 1 MMOLE/ML IV SOLN
10.0000 mmol | Freq: Once | INTRAVENOUS | Status: AC
  Administered 2012-08-14: 10 mmol via INTRAVENOUS
  Filled 2012-08-14: qty 10

## 2012-08-14 MED ORDER — FUROSEMIDE 10 MG/ML IJ SOLN: 60.0000 mg | Freq: Once | INTRAMUSCULAR | Status: DC

## 2012-08-14 MED ORDER — METOPROLOL TARTRATE 1 MG/ML IV SOLN
5.0000 mg | Freq: Four times a day (QID) | INTRAVENOUS | Status: DC
  Administered 2012-08-14 – 2012-08-15 (×4): 5 mg via INTRAVENOUS
  Filled 2012-08-14 (×6): qty 5

## 2012-08-14 MED ORDER — FUROSEMIDE 10 MG/ML IJ SOLN
40.0000 mg | Freq: Every day | INTRAMUSCULAR | Status: DC
  Administered 2012-08-14 – 2012-08-17 (×3): 40 mg via INTRAVENOUS
  Filled 2012-08-14 (×4): qty 4

## 2012-08-14 NOTE — Progress Notes (Addendum)
MD instructed nurse to place order for Ativan 1 mg q 8 hours for HR above 100 or for pain.    Contacted MD; pt new admit to floor.  Pt has increased work of breathing, tachycardia, tachypnic and BP 170's-150's.

## 2012-08-14 NOTE — Progress Notes (Signed)
   CARDIOTHORACIC SURGERY PROGRESS NOTE   R4 Days Post-Op Procedure(s) (LRB): ESOPHAGOSCOPY (N/A) INCISION AND DRAINAGE (Right)  Subjective: No complaints but feels very weak.  Objective: Vital signs: BP Readings from Last 1 Encounters:  08/14/12 169/62   Pulse Readings from Last 1 Encounters:  08/14/12 97   Resp Readings from Last 1 Encounters:  08/14/12 21   Temp Readings from Last 1 Encounters:  08/14/12 98.4 F (36.9 C) Oral    Hemodynamics:    Physical Exam:  Rhythm:   Sinus tach with PAC's  Breath sounds: Diminished at bases  Heart sounds:  tachy  Incisions:  Expected serous drainage from Penrose drains  Abdomen:  soft  Extremities:  warm   Intake/Output from previous day: 10/14 0701 - 10/15 0700 In: 2837.5 [I.V.:875; IV Piggyback:210; TPN:1752.5] Out: 1450 [Urine:1450] Intake/Output this shift:    Lab Results:  Basename 08/14/12 0444 08/13/12 0400  WBC 7.2 8.2  HGB 9.1* 9.6*  HCT 35.1* 28.9*  PLT 171 157   BMET:  Basename 08/14/12 0550 08/13/12 0400  NA 143 142  K 3.7 3.9  CL 105 105  CO2 32 30  GLUCOSE 155* 191*  BUN 33* 32*  CREATININE 0.83 0.96  CALCIUM 9.6 9.0    CBG (last 3)   Basename 08/14/12 0731 08/14/12 0412 08/14/12 0020  GLUCAP 160* 174* 194*   ABG    Component Value Date/Time   PHART 7.427* 08/07/2008 0515   HCO3 23.6 08/07/2008 0515   TCO2 26 08/07/2008 1658   ACIDBASEDEF 1.0 08/07/2008 0515   O2SAT 99.0 08/07/2008 0515   CXR: n/a  Assessment/Plan: S/P Procedure(s) (LRB): ESOPHAGOSCOPY (N/A) INCISION AND DRAINAGE (Right)  Clinically stable Still tachycardic and hypertensive - has yet to be addressed by medical team - will increase beta blocker I/O's remain positive and weight >> preop - might benefit from daily diuretic given large IV volume administration - add lasix   OWEN,CLARENCE H 08/14/2012 8:51 AM

## 2012-08-14 NOTE — Progress Notes (Signed)
08/14/2012 9:05 AM  Amy [name], [place] [id number]  Post-Op Day 4    Temp:  [97.9 F (36.6 C)-99.4 F (37.4 C)] 98.4 F (36.9 C) (10/15 0734) Pulse Rate:  [86-110] 97  (10/15 0700) Resp:  [17-32] 21  (10/15 0700) BP: (139-209)/(46-110) 169/62 mmHg (10/15 0700) SpO2:  [93 %-98 %] 97 % (10/15 0700) Weight:  [77 kg (169 lb 12.1 oz)] 77 kg (169 lb 12.1 oz) (10/15 0500),     Intake/Output Summary (Last 24 hours) at 08/14/12 0905 Last data filed at 08/14/12 0700  Gross per 24 hour  Intake 2487.5 ml  Output   1450 ml  Net 1037.5 ml    Results for orders placed during the hospital encounter of 08/07/12 (from the past 24 hour(s))  GLUCOSE, CAPILLARY     Status: Abnormal   Collection Time   08/13/12 11:51 AM      Component Value Range   Glucose-Capillary 178 (*) 70 - 99 mg/dL   Comment 1 Notify RN    GLUCOSE, CAPILLARY     Status: Abnormal   Collection Time   08/13/12  4:04 PM      Component Value Range   Glucose-Capillary 179 (*) 70 - 99 mg/dL   Comment 1 Notify RN    GLUCOSE, CAPILLARY     Status: Abnormal   Collection Time   08/13/12  7:42 PM      Component Value Range   Glucose-Capillary 178 (*) 70 - 99 mg/dL  GLUCOSE, CAPILLARY     Status: Abnormal   Collection Time   08/14/12 12:20 AM      Component Value Range   Glucose-Capillary 194 (*) 70 - 99 mg/dL  GLUCOSE, CAPILLARY     Status: Abnormal   Collection Time   08/14/12  4:12 AM      Component Value Range   Glucose-Capillary 174 (*) 70 - 99 mg/dL  CBC     Status: Abnormal   Collection Time   08/14/12  4:44 AM      Component Value Range   WBC 7.2  4.0 - 10.5 K/uL   RBC 2.99 (*) 3.87 - 5.11 MIL/uL   Hemoglobin 9.1 (*) 12.0 - 15.0 g/dL   HCT 35.1 (*) 36.0 - 46.0 %   MCV 117.4 (*) 78.0 - 100.0 fL   MCH 30.4  26.0 - 34.0 pg   MCHC 25.9 (*) 30.0 - 36.0 g/dL   RDW 17.4 (*) 11.5 - 15.5 %   Platelets 171  150 - 400 K/uL  BASIC METABOLIC PANEL     Status: Abnormal   Collection Time   08/14/12  5:50 AM   Component Value Range   Sodium 143  135 - 145 mEq/L   Potassium 3.7  3.5 - 5.1 mEq/L   Chloride 105  96 - 112 mEq/L   CO2 32  19 - 32 mEq/L   Glucose, Bld 155 (*) 70 - 99 mg/dL   BUN 33 (*) 6 - 23 mg/dL   Creatinine, Ser 0.83  0.50 - 1.10 mg/dL   Calcium 9.6  8.4 - 10.5 mg/dL   GFR calc non Af Amer 61 (*) >90 mL/min   GFR calc Af Amer 71 (*) >90 mL/min  MAGNESIUM     Status: Normal   Collection Time   08/14/12  5:50 AM      Component Value Range   Magnesium 1.9  1.5 - 2.5 mg/dL  PHOSPHORUS     Status: Abnormal   Collection Time  08/14/12  5:50 AM      Component Value Range   Phosphorus 2.2 (*) 2.3 - 4.6 mg/dL  GLUCOSE, CAPILLARY     Status: Abnormal   Collection Time   08/14/12  7:31 AM      Component Value Range   Glucose-Capillary 160 (*) 70 - 99 mg/dL   Comment 1 Notify RN     Comment 2 Documented in Chart      SUBJECTIVE:  More energetic.  Daughter notes nasal congestion  OBJECTIVE:  Nose crusted both sides secondary to nasal cannula O2.  Mod. Mucoid drainage.  IMPRESSION:  Nasal obstruction from crusting.  Perforation still patent.  PLAN:  Nasal hygiene measures.  Add Peridex oral swish/spit.  [name]

## 2012-08-14 NOTE — Progress Notes (Signed)
Physical Therapy Treatment Patient Details Name: Amy [name] MRN: [id number] DOB: 1922-10-05 Today's Date: 08/14/2012 Time: [id number] PT Time Calculation (min): 26 min  PT Assessment / Plan / Recommendation Comments on Treatment Session  Pt s/p lt hip hemiarthroplasty and lt forearm ORIF.  Post op esophageal tear which required surgery 08/08/12.  Now NPO and on TNA.  Pt with improved mobility and participation today.    Follow Up Recommendations  Post acute inpatient     Does the patient have the potential to tolerate intense rehabilitation  No, Recommend LTACH  Barriers to Discharge        Equipment Recommendations  None recommended by PT    Recommendations for Other Services    Frequency Min 4X/week   Plan Discharge plan remains appropriate;Frequency needs to be updated    Precautions / Restrictions Precautions Precautions: Posterior Hip;Fall Restrictions Weight Bearing Restrictions: No LUE Weight Bearing: Weight bear through elbow only LLE Weight Bearing: Weight bearing as tolerated   Pertinent Vitals/Pain VSS    Mobility  Bed Mobility Sit to Supine: 1: +2 Total assist;HOB flat Sit to Supine: Patient Percentage: 20% Details for Bed Mobility Assistance: Pt requires extensive physical A for trunk and BLEs. Transfers Transfers: Stand Pivot Transfers Sit to Stand: 1: +2 Total assist;From chair/3-in-1;With upper extremity assist Sit to Stand: Patient Percentage: 50% Stand to Sit: 1: +2 Total assist;To bed;To chair/3-in-1;With upper extremity assist Stand to Sit: Patient Percentage: 50% Stand Pivot Transfers: 1: +2 Total assist (used rolling walker with lt platform) Stand Pivot Transfers: Patient Percentage: 50% Details for Transfer Assistance: Physical A needed to rise, stabilize, control descent and position LUE on PFRW.  Ambulation/Gait Ambulation/Gait Assistance: 1: +2 Total assist Ambulation/Gait: Patient Percentage: 50% Ambulation Distance (Feet): 2  Feet Assistive device: Rolling walker;Left platform walker Ambulation/Gait Assistance Details: verbal/tactile cues to stand more erect and for sequence. Gait Pattern: Step-to pattern;Decreased stance time - left;Decreased step length - right;Trunk flexed    Exercises     PT Diagnosis:    PT Problem List:   PT Treatment Interventions:     PT Goals Acute Rehab PT Goals PT Goal: Sit to Supine/Side - Progress: Progressing toward goal PT Goal: Sit to Stand - Progress: Progressing toward goal PT Goal: Stand to Sit - Progress: Progressing toward goal Pt will Transfer Bed to Chair/Chair to Bed: with modified independence Pt will Ambulate: 16 - 50 feet;with mod assist;with least restrictive assistive device PT Goal: Ambulate - Progress: Goal set today  Visit Information  Last PT Received On: 08/14/12 Assistance Needed: +2 PT/OT Co-Evaluation/Treatment: Yes    Subjective Data  Subjective: Pt stated she was ready to get back to bed.   Cognition  Overall Cognitive Status: Appears within functional limits for tasks assessed/performed Arousal/Alertness: Lethargic Orientation Level: Appears intact for tasks assessed Behavior During Session: Lethargic    Balance  Static Sitting Balance Static Sitting - Balance Support: Right upper extremity supported;Feet supported Static Sitting - Level of Assistance: Other (comment) (minguard A)  End of Session PT - End of Session Equipment Utilized During Treatment: Oxygen Activity Tolerance: Patient limited by fatigue;Patient limited by pain Patient left: in bed;with call bell/phone within reach Nurse Communication: Mobility status   GP     [facility] 08/14/2012, 4:27 PM  [employer] PT [phone]

## 2012-08-14 NOTE — Progress Notes (Signed)
Occupational Therapy Treatment Patient Details Name: Amy [name] MRN: [id number] DOB: 1922/03/28 Today's Date: 08/14/2012 Time: [id number] OT Time Calculation (min): 26 min  OT Assessment / Plan / Recommendation Comments on Treatment Session Pt made significant gains since yesterday's session. Was able to stand with PFRW and take pivotal steps back to bed. Pt still limited by pain and fatigue. Vitals stable throughout.    Follow Up Recommendations  Skilled nursing facility    Barriers to Discharge       Equipment Recommendations  None recommended by OT    Recommendations for Other Services    Frequency Min 2X/week   Plan Discharge plan remains appropriate    Precautions / Restrictions Precautions Precautions: Posterior Hip;Fall Restrictions Weight Bearing Restrictions: No LLE Weight Bearing: Weight bearing as tolerated   Pertinent Vitals/Pain Pt reported 10/10 all over body pain post session. RN aware and pt repositioned for comfort. BP stable throughout.    ADL  Toilet Transfer: Simulated;+2 Total assistance Toilet Transfer: Patient Percentage: 50% Toilet Transfer Method: Stand pivot Toilet Transfer Equipment: Other (comment) (recliner<>back to bed.) Toileting - Clothing Manipulation and Hygiene: Performed;+2 Total assistance Toileting - Clothing Manipulation and Hygiene: Patient Percentage: 50% Where Assessed - Toileting Clothing Manipulation and Hygiene: Standing (tech had to provide back peri care.) ADL Comments: Pt was able to take several pivotal steps from chair>back to bed using PFRW and max multimodal cues. Pt limited by pain and fatigue. Able to complete 3 reps of digit flexion/extension on L hand.    OT Diagnosis:    OT Problem List:   OT Treatment Interventions:     OT Goals ADL Goals ADL Goal: Toilet Transfer - Progress: Progressing toward goals ADL Goal: Toileting - Clothing Manipulation - Progress: Progressing toward goals ADL Goal: Toileting - Hygiene  - Progress: Progressing toward goals Arm Goals Arm Goal: Additional Goal #1 - Progress: Progressing toward goals Miscellaneous OT Goals OT Goal: Miscellaneous Goal #1 - Progress: Progressing toward goals  Visit Information  Last OT Received On: 08/14/12 Assistance Needed: +2 PT/OT Co-Evaluation/Treatment: Yes    Subjective Data  Subjective: Everything hurts...   Prior Functioning       Cognition  Overall Cognitive Status: Appears within functional limits for tasks assessed/performed Arousal/Alertness: Lethargic Orientation Level: Appears intact for tasks assessed Behavior During Session: Lethargic    Mobility  Shoulder Instructions Bed Mobility Sit to Supine: 1: +2 Total assist;HOB flat Sit to Supine: Patient Percentage: 20% Details for Bed Mobility Assistance: Pt requires extensive physical A for trunk and BLEs. Transfers Transfers: Sit to Stand;Stand to Sit Sit to Stand: 1: +2 Total assist;From chair/3-in-1;With upper extremity assist Sit to Stand: Patient Percentage: 50% Stand to Sit: 1: +2 Total assist;To bed;To chair/3-in-1;With upper extremity assist Stand to Sit: Patient Percentage: 50% Details for Transfer Assistance: Physical A needed to rise, stabilize, control descent and position LUE on PFRW.        Exercises      Balance Static Sitting Balance Static Sitting - Balance Support: Right upper extremity supported;Feet supported Static Sitting - Level of Assistance: Other (comment) (minguard A)   End of Session OT - End of Session Activity Tolerance: Patient limited by fatigue;Patient limited by pain Patient left: in bed;with call bell/phone within reach  [place] A OTR/L [phone] 08/14/2012, 3:29 PM

## 2012-08-14 NOTE — Progress Notes (Signed)
[facility] Progress Note [place] TEAM 1 - Stepdown/ICU TEAM   Niger M Ahuja [id number] DOB: 1922/01/04 DOA: 08/07/2012 PCP: Purvis Kilts, MD  Brief narrative: 78 year old female who lives alone. She experienced a mechanical fall going up the steps at home. This was not associated with loss of consciousness, prefall chest pain palpitations or lightheadedness. Despite this the daughter reports patient has been having suboptimal blood pressures for her with systolic in the [id number] the patient had been complaining of some nonspecific dizziness. The patient was transported to the hospital via EMS and she was found to be in atrial fibrillation with RVR with a ventricular response of 120. She subsequently converted back to sinus rhythm. She was initially treated at [facility] [place]. After arrival she was found to have impacted fracture of left hip as well as a left radial head fracture. Because of her underlying multiple medical problems including coronary artery disease and chronic kidney disease she was subsequently transported to [facility] for further monitoring and treatment. She eventually underwent surgical treatment for her fractures. Because of her history of GERD swallowing evaluation was obtained which showed no evidence of oral pharyngeal dysphagia but apparently she continued with odynophagia so a gastroenterology consult was obtained. She subsequently underwent EGD which revealed a mucosal defect in the cervical esophagus that was either a ulceration versus a perforation. She eventually underwent an esophagram with Gastrografin that confirmed a contained perforation. Cardiothoracic surgery was consulted. They felt that the etiology to the small perforation was related to a traumatic injury that was related to intubation at time of her initial orthopedic surgery on October 9. At the request of the thoracic surgeon an ENT consult was obtained as well as a CT scan of the neck and chest  to rule out any significant fluid collections. The CT did reveal extensive retropharyngeal gas dissecting upwards from the mediastinum with an associated focal perforation of the esophagus at the level of the cricoid cartilage with oral contrast extravasating into the retro-pharyngeal spacing communicating with the superior mediastinum. Subsequently the patient underwent incision and drainage of this area by ENT surgeon. Currently she is stable on parenteral nutrition in the step down unit. Team 1 assumed care of this patient on 08/14/2012.  Principal Problem:  *Fracture of left hip/radial head after fall at home: A) Status post hip hemiarthroplasty left hip by Dr Ronnie Derby 08/08/2012 B) s/p ORIF by Dr Burney Gauze 08/08/2012 *Management per orthopedic team *Daughter reports left extremity cast to be removed today *Continue OT and PT as tolerated  Active Problems: Odynophagia, new traumatic peri op, esophageal perforation seen on endoscopy:  A) Mediastinitis due to esophageal perforation-s/p Incision and drainage, right neck and mediastinum, transcervical approach. Cervical esophagoscopy *Appreciate cardiothoracic and ENT assistance *Continue strict n.p.o. and parenteral nutrition *ENT adding nasal hygiene measures and Peridex oral swish and spit *Continue empiric IV Zosyn *Will eventually need repeat Gastrografin study in next 7-10 days *Although perforation is high (cervical level) given her history of GERD she may not be able to tolerate gastric feedings via a percutaneous tube although consideration could be given to short-term jejunostomy feedings below the stomach and pylorus   Acute respiratory failure with hypoxia due to Pulmonary edema, noncardiac *Patient nearly 7 L positive since admission-IV fluids and blood product *Was started on parenteral nutrition recently so we'll decrease maintenance fluids to keep open rate *Appreciate cardiothoracic surgery assistance and would continue Lasix 40 mg  IV twice a day for now *Continue supportive care with oxygen and  pulmonary toileting since most recent chest x-ray from 08/12/2012 revealed not only evidence of volume overload but bilateral basilar atelectasis  Acute blood loss anemia *Status post transfusion of packed red blood cells this admission *Hemoglobin nadir 6.1 on 08/12/2012 *Current hemoglobin up to 9.1 *Likely etiology do to multiple recent surgical procedures   Hypertension *Started on IV Lopressor 08/14/2012  Protein calorie malnutrition/hyperglycemia *Pharmacy managing parenteral nutrition and lipids as well as electrolytes *Continue supplemental sliding scale insulin   CAD, CABG X 3 10/09. Myoview low risk 10/10. EF >55% 2D 6/13. *Received preoperative cardiac clearance and has remained asymptomatic since admission   Chronic renal insufficiency, stage III (moderate), SCr 1.4 Sept 2013 *Renal function remained stable and her usual baseline   PAF/SSS history ofPacemaker March 2010 (MDT) *Remain stable with sinus rhythm and sinus tachycardia *Beta blockers resumed 08/14/2012   Hyperlipidemia, statin intol. *N.p.o.   Aortic valve sclerosis, no stenosis   Carotid bruit present bilat, dopplers OK 6/13 *Neurologically stable postop   History of UTI- recent completion 10 days Cipro *Urinalysis at presentation negative for infectious process although did have mucous present with hyaline casts concerning for possible early ATN   Hx of dizziness *Initial fall reported as mechanical so no indication to pursue syncope work   DVT after previous hip surgery *Continue SCDs   DVT prophylaxis: SCDs Code Status: DO NOT RESUSCITATE Family Communication: Spoke with patient's daughter  Disposition Plan: Remain in stepdown  Consultants: Orthopedic Cardiothoracic Gastroenterology ENT  Procedures: ORIF distal left radial fracture and left arthroplasty bipolar hip on 08/08/2012 by Dr. Burney Gauze  EGD 08/10/2012 by Dr.  Benson [place]  Incision and drainage of right neck and mediastinum with a transcervical approach as well as cervical esophagoscopy on [id number] by Dr. Erik Obey  Antibiotics: Zosyn 10/11 >>>  HPI/Subjective: Patient sedated and not verbally communicating but able to open eyes when questioned. Appears to be in mild discomfort but according to the daughter when asked she denies any pain.   Objective: Blood pressure 155/61, pulse 105, temperature 98.4 F (36.9 C), temperature source Oral, resp. rate 20, height 5\' 5"  (1.651 m), weight 77 kg (169 lb 12.1 oz), SpO2 95.00%.  Intake/Output Summary (Last 24 hours) at 08/14/12 1125 Last data filed at 08/14/12 1100  Gross per 24 hour  Intake 2757.5 ml  Output   2300 ml  Net  457.5 ml     Exam: General: Minimal respiratory distress, lethargic secondary to sedation ENT: Neck incisions covered by dressing which is clean dry and intact Lungs: Scattered bilateral crackles and diminished in the bases, nasal cannula oxygen Cardiovascular: Regular rate and rhythm without murmur gallop or rub normal S1 and S2, trace peripheral edema, IV fluid at 35 cc per hour Abdomen: Nontender, nondistended, soft, bowel sounds positive, no rebound, no ascites, no appreciable mass, parenteral nutrition infusing at 65 cc per hour Musculoskeletal: No significant cyanosis, clubbing of bilateral lower extremities Neurological: Patient is arousable but not attempting to verbally communicate with [place] during the exam, she spontaneously moves extremities x4 but formal strength exam was not completed, there is no appearance of any focal neurological deficits appreciated  Data Reviewed: Basic Metabolic Panel:  Lab [id number] 0550 08/13/12 0400 08/12/12 0450 08/11/12 0528 08/10/12 0600  NA 143 142 142 141 136  K 3.7 3.9 3.6 4.5 4.3  CL 105 105 108 107 101  CO2 32 30 29 26 24   GLUCOSE 155* 191* 156* 165* 107*  BUN 33* 32* 28* 25* 26*  CREATININE 0.83 0.96 0.98  1.16* 1.18*  CALCIUM  9.6 9.0 9.2 9.2 8.9  MG 1.9 1.9 2.1 -- --  PHOS 2.2* 2.7 1.9* -- --   Liver Function Tests:  Lab 08/13/12 0400 08/12/12 0450 08/11/12 0528 08/07/12 1141  AST 33 17 27 33  ALT 14 7 9 21   ALKPHOS 221* 92 121* 87  BILITOT 1.0 0.5 0.6 0.3  PROT 5.1* 5.1* 5.7* 6.9  ALBUMIN 1.8* 1.8* 2.2* 3.7   No results found for this basename: LIPASE:5,AMYLASE:5 in the last 168 hours No results found for this basename: AMMONIA:5 in the last 168 hours CBC:  Lab 08/14/12 0444 08/13/12 0400 08/12/12 0730 08/12/12 0450 08/11/12 0528 08/07/12 1141  WBC 7.2 8.2 8.9 8.0 10.0 --  NEUTROABS -- 6.8 -- 6.7 -- 4.2  HGB 9.1* 9.6* 6.1* 6.9* 8.6* --  HCT 35.1* 28.9* 18.8* 21.1* 26.2* --  MCV 117.4* 88.1 91.7 91.3 91.3 --  PLT 171 157 179 145* 145* --   Cardiac Enzymes: No results found for this basename: CKTOTAL:5,CKMB:5,CKMBINDEX:5,TROPONINI:5 in the last 168 hours BNP (last 3 results) No results found for this basename: PROBNP:3 in the last 8760 hours CBG:  Lab 08/14/12 0731 08/14/12 0412 08/14/12 0020 08/13/12 1942 08/13/12 1604  GLUCAP 160* 174* 194* 178* 179*    Recent Results (from the past 240 hour(s))  URINE CULTURE     Status: Normal   Collection Time   08/07/12 11:24 AM      Component Value Range Status Comment   Specimen Description URINE, CATHETERIZED   Final    Special Requests NONE   Final    Culture  Setup Time 08/07/2012 16:50   Final    Colony Count NO GROWTH   Final    Culture NO GROWTH   Final    Report Status 08/08/2012 FINAL   Final   SURGICAL PCR SCREEN     Status: Normal   Collection Time   08/08/12  5:32 AM      Component Value Range Status Comment   MRSA, PCR NEGATIVE  NEGATIVE Final    Staphylococcus aureus NEGATIVE  NEGATIVE Final   WOUND CULTURE     Status: Normal (Preliminary result)   Collection Time   08/10/12 10:29 PM      Component Value Range Status Comment   Specimen Description WOUND ESOPHAGUS   Final    Special Requests POF ZOSYN   Final    Gram Stain      Final    Value: FEW WBC PRESENT, PREDOMINANTLY PMN     RARE SQUAMOUS EPITHELIAL CELLS PRESENT     ABUNDANT GRAM POSITIVE RODS     FEW GRAM NEGATIVE RODS   Culture     Final    Value: MULTIPLE ORGANISMS PRESENT, NONE PREDOMINANT NO STAPHYLOCOCCUS AUREUS ISOLATED NO GROUP A STREP (S.PYOGENES) ISOLATED   Report Status PENDING   Incomplete   ANAEROBIC CULTURE     Status: Normal (Preliminary result)   Collection Time   08/10/12 10:29 PM      Component Value Range Status Comment   Specimen Description WOUND ESOPHAGUS   Final    Special Requests PATIENT ON FOLLOWING ZOSYN   Final    Gram Stain PENDING   Incomplete    Culture     Final    Value: NO ANAEROBES ISOLATED; CULTURE IN PROGRESS FOR 5 DAYS   Report Status PENDING   Incomplete      Studies:  Recent x-ray studies have been reviewed in detail by the Attending  Physician  Scheduled Meds:  Reviewed in detail by the Attending Physician   Erin Hearing, [place] [facility] Office  [phone] Pager [phone]  On-Call/Text Page:      [email or web address]      password [email or web address]  If 7PM-7AM, please contact night-coverage [email or web address] Password [email or web address] 08/14/2012, 11:25 AM   LOS: 7 days   I agree with the assessment and plan above done by nurse practitioner. Patient was seen and examined at bedside and we have reviewed labs and imaging studies with the patient and the family.  Assessment and Plan:  Principal Problem: *Esophageal perforation - appreciate CTS and ENT following - we will continue TNA, decrease IV fluids rate - will need Gastrografin study in about a week  Active Problems: outlines above by NP; I have reviewed and agree with the assessment and plan.  Leisa Lenz [facility] [phone]

## 2012-08-14 NOTE — Progress Notes (Signed)
Nutrition Follow-up  Intervention:    TPN per pharmacy RD to follow for nutrition care plan  Assessment:   Esophageal perforation still patent. Noted consideration of PEG vs J-tube placement during hospitalization. Gastrografin study in next few days.  Patient is receiving TPN with Clinimix E 5/20 @ 65 ml/hr.  Lipids (20% IVFE @ 10 ml/hr), multivitamins, and trace elements are provided 3 times weekly (MWF) due to national backorder.  Provides 1578 kcal and 78 grams protein daily (based on weekly average).  Meets 100% minimum estimated kcal and 100% minimum estimated protein needs.  Diet Order:  NPO  Meds: Scheduled Meds:   . bacitracin  1 application Topical BID  . chlorhexidine  5 mL Mouth/Throat QID  . furosemide  40 mg Intravenous Daily  . insulin aspart  0-9 Units Subcutaneous Q4H  . metoprolol  5 mg Intravenous Q6H  . pantoprazole (PROTONIX) IV  40 mg Intravenous Q12H  . piperacillin-tazobactam (ZOSYN)  IV  3.375 g Intravenous Q8H  . sodium chloride  10-40 mL Intracatheter Q12H  . sodium chloride  3 mL Intravenous Q12H  . sodium glycerophosphate 0.9% NaCl IVPB  10 mmol Intravenous Once  . DISCONTD: fentaNYL  50-100 mcg Intravenous Once  . DISCONTD: furosemide  60 mg Intravenous Once  . DISCONTD: metoprolol  2.5 mg Intravenous Q8H   Continuous Infusions:   . sodium chloride 35 mL/hr at 08/13/12 0740  . fat emulsion 240 mL (08/14/12 0600)  . TPN (CLINIMIX) +/- additives 65 mL/hr at 08/12/12 1750  . TPN (CLINIMIX) +/- additives 65 mL/hr at 08/14/12 0600  . TPN (CLINIMIX) +/- additives     PRN Meds:.acetaminophen, bacitracin, hydrALAZINE, morphine injection, ondansetron (ZOFRAN) IV, ondansetron, sodium chloride  Labs:  CMP     Component Value Date/Time   NA 143 08/14/2012 0550   K 3.7 08/14/2012 0550   CL 105 08/14/2012 0550   CO2 32 08/14/2012 0550   GLUCOSE 155* 08/14/2012 0550   BUN 33* 08/14/2012 0550   CREATININE 0.83 08/14/2012 0550   CALCIUM 9.6 08/14/2012  0550   PROT 5.1* 08/13/2012 0400   ALBUMIN 1.8* 08/13/2012 0400   AST 33 08/13/2012 0400   ALT 14 08/13/2012 0400   ALKPHOS 221* 08/13/2012 0400   BILITOT 1.0 08/13/2012 0400   GFRNONAA 61* 08/14/2012 0550   GFRAA 71* 08/14/2012 0550    Phosphorus  Date Value Range Status  08/14/2012 2.2* 2.3 - 4.6 mg/dL Final    Magnesium  Date Value Range Status  08/14/2012 1.9  1.5 - 2.5 mg/dL Final     Intake/Output Summary (Last 24 hours) at 08/14/12 1042 Last data filed at 08/14/12 1005  Gross per 24 hour  Intake 2739.5 ml  Output   2000 ml  Net  739.5 ml    CBG (last 3)   Basename 08/14/12 0731 08/14/12 0412 08/14/12 0020  GLUCAP 160* 174* 194*    Weight Status:  77 kg (10/15) -- stable  Estimated needs:  1550-1850 kcals, 77-90 gm protein  Nutrition Dx:  Inadequate Oral Intake r/t inability to eat as evidenced by NPO status, ongoing  Goal:  TPN to meet >90% of estimated protein needs, maximize energy provision as able during national lipid backorder, met  Monitor:  TPN prescription, ability to transition to EN, weight, labs, I/O's  Phillips Odor, RD, LDN Pager #: [phone] After-Hours Pager #: [phone]

## 2012-08-14 NOTE — Progress Notes (Signed)
PARENTERAL NUTRITION CONSULT NOTE - FOLLOW UP  Pharmacy Consult for TPN Indication: Esophageal perforation, s/p esophagoscopy and I&D  Allergies  Allergen Reactions  . Hydrocodone-Acetaminophen Itching    Tolerates tylenol    Patient Measurements: Height: 5\' 5"  (165.1 cm) Weight: 169 lb 12.1 oz (77 kg) IBW/kg (Calculated) : 57  Adjusted Body Weight: 65 Kg  Vital Signs: Temp: 98.4 F (36.9 C) (10/15 0734) Temp src: Oral (10/15 0734) BP: 169/62 mmHg (10/15 0700) Pulse Rate: 97  (10/15 0700) Intake/Output from previous day: 10/14 0701 - 10/15 0700 In: 2837.5 [I.V.:875; IV Piggyback:210; TPN:1752.5] Out: 1450 [Urine:1450] Intake/Output from this shift:    Labs:  [facility] 08/14/12 0444 08/13/12 0400 08/12/12 0730  WBC 7.2 8.2 8.9  HGB 9.1* 9.6* 6.1*  HCT 35.1* 28.9* 18.8*  PLT 171 157 179  APTT -- -- --  INR -- -- --     Basename 08/14/12 0550 08/13/12 0400 08/12/12 0450  NA 143 142 142  K 3.7 3.9 3.6  CL 105 105 108  CO2 32 30 29  GLUCOSE 155* 191* 156*  BUN 33* 32* 28*  CREATININE 0.83 0.96 0.98  LABCREA -- -- --  CREAT24HRUR -- -- --  CALCIUM 9.6 9.0 9.2  MG 1.9 1.9 2.1  PHOS 2.2* 2.7 1.9*  PROT -- 5.1* 5.1*  ALBUMIN -- 1.8* 1.8*  AST -- 33 17  ALT -- 14 7  ALKPHOS -- 221* 92  BILITOT -- 1.0 0.5  BILIDIR -- -- --  IBILI -- -- --  PREALBUMIN -- -- 4.3*  TRIG -- -- 117  CHOLHDL -- -- --  CHOL -- -- 146   Estimated Creatinine Clearance: 48.1 ml/min (by C-G formula based on Cr of 0.83).    Basename 08/14/12 0731 08/14/12 0412 08/14/12 0020  GLUCAP 160* 174* 194*    Insulin Requirements in the past 24 hours:  SSI started yesterday - 12 units since noon  Current Nutrition:  TPN  Nutritional Goals:  1550-1800 kCal, 77-90 grams of protein per day per RD assessment  Assessment:  Patient presented to [facility] s/p fall and fractures needing repair, she was transferred here for hip and wrist fracture repair. S/p fracture repair she complained of  odynophagia and underwent upper endoscopy per GI, which showed a contained esophageal perforation. She is s/p Incision and drainage, right neck and mediastinum.   GI: Currently NPO, plans to keep this way for now to allow healing of esophageal tear, plans for gastrograffin swallow in ~ 5-7 days. Noted patient has been without significant nutrients since 10/8 until TPN started 10/12. Moderate refeeding risk.  Endo: No hx DM, A1c 5.5% at BL, CBG elevated and some above goal (range 174-194) - SSI started 10/14 Lytes: Phos slightly low at 2.2 - no evidence of refeeding syndrome Renal: Scr stable at 0.83 today, UOP 0.8 Pulm: 2L [place]  Cards: BP 169/62, HR 97 - continues on low dose IV lopressor  Hepatobil: LFTs, cholesterol and triglycerides are WNL, prealbumin low at 4.3 ID: Zosyn#5 empirically for esophageal perf. WBC wnl, afebrile.  Best Practices: SCDs, PPI IV TPN Access: Triple lumen PICC  TPN day#: 4  Plan:  - Continue Clinimix E 5/20 at 65cc/hr tonight (Provides goal protein of 78gm/day and average est Kcal of 1552 Kcal based on three times a week lipid supplementation) - Will supplement MVI, trace elements and IV fats on MWF only d/t ongoing Engineer, manufacturing systems.  - NaPhos 31mmol IV x 1 today - Will f/up BMET, Mg and Phos  in AM - f/u TF plans  Salome Arnt, PharmD, BCPS Pager # [phone] 08/14/2012 8:10 AM

## 2012-08-15 DIAGNOSIS — K223 Perforation of esophagus: Secondary | ICD-10-CM

## 2012-08-15 DIAGNOSIS — J9851 Mediastinitis: Secondary | ICD-10-CM

## 2012-08-15 LAB — GLUCOSE, CAPILLARY
Glucose-Capillary: 144 mg/dL — ABNORMAL HIGH (ref 70–99)
Glucose-Capillary: 161 mg/dL — ABNORMAL HIGH (ref 70–99)

## 2012-08-15 LAB — ANAEROBIC CULTURE

## 2012-08-15 LAB — BASIC METABOLIC PANEL
BUN: 38 mg/dL — ABNORMAL HIGH (ref 6–23)
Chloride: 103 mEq/L (ref 96–112)
GFR calc Af Amer: 64 mL/min — ABNORMAL LOW (ref >90)
GFR calc non Af Amer: 55 mL/min — ABNORMAL LOW (ref >90)
Potassium: 3.5 mEq/L (ref 3.5–5.1)
Sodium: 144 mEq/L (ref 135–145)

## 2012-08-15 LAB — MAGNESIUM: Magnesium: 1.7 mg/dL (ref 1.5–2.5)

## 2012-08-15 LAB — PHOSPHORUS: Phosphorus: 3 mg/dL (ref 2.3–4.6)

## 2012-08-15 MED ORDER — METOPROLOL TARTRATE 1 MG/ML IV SOLN
5.0000 mg | INTRAVENOUS | Status: DC
Start: 2012-08-15 — End: 2012-08-17
  Administered 2012-08-15 – 2012-08-17 (×12): 5 mg via INTRAVENOUS
  Filled 2012-08-15 (×14): qty 5

## 2012-08-15 MED ORDER — FUROSEMIDE 10 MG/ML IJ SOLN
INTRAMUSCULAR | Status: AC
  Filled 2012-08-15: qty 4

## 2012-08-15 MED ORDER — FAT EMULSION 20 % IV EMUL
240.0000 mL | INTRAVENOUS | Status: AC
  Administered 2012-08-15: 240 mL via INTRAVENOUS
  Filled 2012-08-15: qty 250

## 2012-08-15 MED ORDER — POTASSIUM CHLORIDE 10 MEQ/50ML IV SOLN
10.0000 meq | INTRAVENOUS | Status: AC
  Administered 2012-08-15 (×3): 10 meq via INTRAVENOUS
  Filled 2012-08-15 (×3): qty 50

## 2012-08-15 MED ORDER — ZINC TRACE METAL 1 MG/ML IV SOLN
INTRAVENOUS | Status: AC
  Administered 2012-08-15: 17:00:00 via INTRAVENOUS
  Filled 2012-08-15: qty 2000

## 2012-08-15 MED ORDER — MAGNESIUM SULFATE 40 MG/ML IJ SOLN
2.0000 g | Freq: Once | INTRAMUSCULAR | Status: AC
  Administered 2012-08-15: 2 g via INTRAVENOUS
  Filled 2012-08-15: qty 50

## 2012-08-15 NOTE — Progress Notes (Addendum)
   CARDIOTHORACIC SURGERY PROGRESS NOTE   R4 Days Post-Op Procedure(s) (LRB): ESOPHAGOSCOPY (N/A) INCISION AND DRAINAGE (Right)  Subjective: Having some incisional pain this am  Objective: Vital signs: BP Readings from Last 1 Encounters:  08/15/12 152/65   Pulse Readings from Last 1 Encounters:  08/15/12 83   Resp Readings from Last 1 Encounters:  08/15/12 24   Temp Readings from Last 1 Encounters:  08/15/12 98.2 F (36.8 C) Oral      Physical Exam:  Rhythm: RRR  Pulmonary: Clear             Abdomen: Soft, bowel sounds present  Extremities: Warm  Wounds:Dressing is clean and dry  Intake/Output from previous day: 10/15 0701 - 10/16 0700 In: 1968 [I.V.:250; IV Piggyback:273; [id number] Out: 1950 [Urine:1950]    Lab Results:  Basename 08/14/12 0444 08/13/12 0400  WBC 7.2 8.2  HGB 9.1* 9.6*  HCT 35.1* 28.9*  PLT 171 157   BMET:   Basename 08/15/12 0505 08/14/12 0550  NA 144 143  K 3.5 3.7  CL 103 105  CO2 38* 32  GLUCOSE 159* 155*  BUN 38* 33*  CREATININE 0.90 0.83  CALCIUM 9.5 9.6    CBG (last 3)   Basename 08/15/12 0748 08/15/12 0356 08/14/12 2348  GLUCAP 144* 141* 173*   ABG    Component Value Date/Time   PHART 7.427* 08/07/2008 0515   HCO3 23.6 08/07/2008 0515   TCO2 26 08/07/2008 1658   ACIDBASEDEF 1.0 08/07/2008 0515   O2SAT 99.0 08/07/2008 0515   CXR: n/a  Assessment/Plan: S/P Procedure(s) (LRB): ESOPHAGOSCOPY (N/A) INCISION AND DRAINAGE (Right)  1.CV-Hypertension. On Metoprolol 5 IV Q 4 hours.May need another medicine for better control. 2.GI-NPO,TNA per pharmacy, continue empiric Zosyn.Will have gastrografin study in next few days. 3.Management per Dr. Elita Quick et al  Lars Pinks M 08/15/2012 8:22 AM  I have seen and examined the patient and agree with the assessment and plan as outlined.  Given the volume of drainage from [place] I doubt that the leak has healed.  I suspect that she may need PEG placement for  potentially long-term nutritional support.  Layana Konkel H 08/15/2012 8:40 AM

## 2012-08-15 NOTE — Progress Notes (Signed)
PARENTERAL NUTRITION CONSULT NOTE - FOLLOW UP  Pharmacy Consult for TPN Indication: Esophageal perforation, s/p esophagoscopy and I&D  Allergies  Allergen Reactions  . Hydrocodone-Acetaminophen Itching    Tolerates tylenol    Patient Measurements: Height: 5\' 5"  (165.1 cm) Weight: 169 lb 12.1 oz (77 kg) IBW/kg (Calculated) : 57  Adjusted Body Weight: 65 Kg  Vital Signs: Temp: 98.3 F (36.8 C) (10/16 0825) Temp src: Axillary (10/16 0825) BP: 164/51 mmHg (10/16 0825) Pulse Rate: 89  (10/16 0825) Intake/Output from previous day: 10/15 0701 - 10/16 0700 In: 1968 [I.V.:250; IV Piggyback:273; [id number] Out: 1950 [Urine:1950] Intake/Output from this shift:    Labs:  Basename 08/14/12 0444 08/13/12 0400  WBC 7.2 8.2  HGB 9.1* 9.6*  HCT 35.1* 28.9*  PLT 171 157  APTT -- --  INR -- --     Basename 08/15/12 0505 08/14/12 0550 08/13/12 0400  NA 144 143 142  K 3.5 3.7 3.9  CL 103 105 105  CO2 38* 32 30  GLUCOSE 159* 155* 191*  BUN 38* 33* 32*  CREATININE 0.90 0.83 0.96  LABCREA -- -- --  CREAT24HRUR -- -- --  CALCIUM 9.5 9.6 9.0  MG 1.7 1.9 1.9  PHOS 3.0 2.2* 2.7  PROT -- -- 5.1*  ALBUMIN -- -- 1.8*  AST -- -- 33  ALT -- -- 14  ALKPHOS -- -- 221*  BILITOT -- -- 1.0  BILIDIR -- -- --  IBILI -- -- --  PREALBUMIN -- -- --  TRIG -- -- --  CHOLHDL -- -- --  CHOL -- -- --   Estimated Creatinine Clearance: 44.3 ml/min (by C-G formula based on Cr of 0.9).    Basename 08/15/12 0748 08/15/12 0356 08/14/12 2348  GLUCAP 144* 141* 173*    Insulin Requirements in the past 24 hours:  5 units SSI  Current Nutrition:  TPN  Nutritional Goals:  1550-1800 kCal, 77-90 grams of protein per day per RD assessment  Assessment:  Patient presented to [facility] s/p fall and fractures needing repair, she was transferred here for hip and wrist fracture repair. S/p fracture repair she complained of odynophagia and underwent upper endoscopy per GI, which showed a contained esophageal  perforation. She is s/p Incision and drainage, right neck and mediastinum.   GI: Currently NPO, plans to keep this way for now to allow healing of esophageal tear, plans for gastrograffin swallow in a few days. Noted patient has been without significant nutrients since 10/8 until TPN started 10/12.  Endo: No hx DM, A1c 5.5% at BL, CBG elevated and some above goal (range 141-191) - SSI started 10/14 Lytes: K 3.5, Phos 3 (s/p supplementation), Mg 1.7 Renal: Scr stable at 0.9 today, UOP 1.1 Pulm: 2L [place]  Cards: BP 164/51, HR 89 - continues on IV lopressor  Hepatobil: LFTs, cholesterol and triglycerides are WNL, prealbumin low at 4.3 ID: Zosyn#6 empirically for esophageal perf. WBC wnl, afebrile.  Best Practices: SCDs, PPI IV TPN Access: Triple lumen PICC  TPN day#: 5  Plan:  - Continue Clinimix E 5/20 at 65cc/hr (Provides goal protein of 78gm/day and average est Kcal of 1552 Kcal based on three times a week lipid supplementation) - Will supplement MVI, trace elements and IV fats on MWF only d/t ongoing Engineer, manufacturing systems.  - Magnesium 2gm IV x 1 - KCl 40meq Q1H x 3 runs - F/u AM labs and CBGs - f/u TF plans  Salome Arnt, PharmD, BCPS Pager # [phone] 08/15/2012 8:41 AM

## 2012-08-15 NOTE — Progress Notes (Signed)
[facility] Progress Note [place] TEAM 1 - Stepdown/ICU TEAM   Niger M Gulas [id number] DOB: 1922-07-04 DOA: 08/07/2012 PCP: Purvis Kilts, MD  Brief narrative: 78 year old female who lives alone. She experienced a mechanical fall going up the steps at home. This was not associated with loss of consciousness, prefall chest pain palpitations or lightheadedness. Despite this the daughter reports patient had been having suboptimal blood pressures with systolics in the [id number] - the patient had been complaining of some nonspecific dizziness. The patient was transported to the hospital via EMS and she was found to be in atrial fibrillation with RVR with a ventricular response of 120. She subsequently converted back to sinus rhythm. She was initially treated at [facility] After arrival she was found to have an impacted fracture of left hip as well as a left radial head fracture. Because of her underlying multiple medical problems including coronary artery disease and chronic kidney disease she was subsequently transported to [facility] for further monitoring and treatment. She eventually underwent surgical treatment for her fractures. Because of complaints of odynophagia a swallowing evaluation was obtained which showed no evidence of oral pharyngeal dysphagia - she continued with odynophagia so a gastroenterology consult was obtained. She subsequently underwent EGD which revealed a mucosal defect in the cervical esophagus that was either a ulceration versus a perforation. She eventually underwent an esophagram with Gastrografin that confirmed a contained perforation. Cardiothoracic surgery was consulted. They felt that the etiology to the small perforation was related to a traumatic injury that was related to intubation at time of her initial orthopedic surgery on October 9. An ENT consult was obtained as well as a CT scan of the neck and chest to rule out any significant fluid collections.  The CT did reveal extensive retropharyngeal gas dissecting upwards from the mediastinum with an associated focal perforation of the esophagus at the level of the cricoid cartilage with oral contrast extravasating into the retro-pharyngeal spacing communicating with the superior mediastinum. Subsequently the patient underwent incision and drainage of this area by ENT surgeon. Currently she is stable on parenteral nutrition in the step down unit. Team 1 assumed care of this patient on 08/14/2012.  Impression and Plan:  Fracture of left hip/radial head after fall at home: A) Status post hip hemiarthroplasty left hip by Dr Ronnie Derby 08/08/2012 B) s/p ORIF by Dr Burney Gauze 08/08/2012 *Management per orthopedic team *Continue OT and PT as tolerated  Odynophagia, new traumatic peri op esophageal perforation seen on endoscopy: A) Mediastinitis due to esophageal perforation - s/p Incision and drainage, right neck and mediastinum, transcervical approach. Cervical esophagoscopy *Appreciate Cardiothoracic and ENT assistance *Continue strict n.p.o. and parenteral nutrition-CVTS recommending PEG tube or fluoroscopically placed nasal gastric feeding tube. I have discussed this with the gastroenterologist Dr. Benson Norway who states given patient's current esophageal anatomy endoscopic placement of gastric tube either by himself or by interventional radiology is contraindicated and he recommended consult to surgery for open gastrostomy tube placement. *ENT adding nasal hygiene measures and Peridex oral swish and spit *Continue empiric IV Zosyn *Will eventually need repeat Gastrografin study in next 7-10 days *CVTS notes patient with significant volume JP drain and therefore perforation likely not closed and this will likely take a long period of time to occur  Acute respiratory failure with hypoxia due to Pulmonary edema, noncardiac *Patient nearly 7 L positive since admission - IV fluids and blood products - has diuresed 2 L  in the past 24 hours with the addition of  Lasix *Was started on parenteral nutrition recently so we'll decrease maintenance fluids to keep open rate *Lasix has been decreased to 40 mg IV daily *Continue supportive care with oxygen and pulmonary toileting since most recent chest x-ray from 08/12/2012 revealed not only evidence of volume overload but bilateral basilar atelectasis  Acute blood loss anemia with macrocytosis *Status post transfusion of packed red blood cells this admission *Hemoglobin nadir 6.1 on 08/12/2012 *Current hemoglobin up to 9.1 *Likely etiology due to multiple recent surgical procedures *Check B12 and folate levels to rule out pernicious anemia  Hypertension *Started on IV Lopressor 08/14/2012 - blood pressure not well controlled so will increase frequency from every 6 hours to every 4 hour - watch for bradycardia  Protein calorie malnutrition/hyperglycemia *Pharmacy managing parenteral nutrition and lipids as well as electrolytes *Continue supplemental sliding scale insulin  CAD, CABG X 3 10/09. Myoview low risk 10/10. EF >55% 2D 6/13. *Received preoperative cardiac clearance and has remained asymptomatic since admission  Chronic renal insufficiency, stage III (moderate), SCr 1.4 Sept 2013 *Renal function remains stable at her usual baseline  PAF/SSS history of Pacemaker March 2010 (MDT) *Remains stable with sinus rhythm and sinus tachycardia *Beta blockers resumed 08/14/2012  Hyperlipidemia, statin intol. *N.p.o.  Aortic valve sclerosis, no stenosis  Carotid bruit present bilat, dopplers OK 6/13 *Neurologically stable postop  History of UTI - recent completion 10 days Cipro *Urinalysis at presentation negative for infectious process although did have mucous present with hyaline casts concerning for possible early ATN  Hx of dizziness *Initial fall reported as mechanical so no indication to pursue syncope work - dizziness could be rate issue in setting  of afib  DVT after previous hip surgery *Continue SCDs  DVT prophylaxis: SCDs Code Status: DO NOT RESUSCITATE Family Communication: Spoke with patient's daughter  Disposition Plan: Remain in stepdown  Consultants: Orthopedic Cardiothoracic Gastroenterology ENT Gen Surgery  Procedures: ORIF distal left radial fracture with carpal tunnel release by Dr. Burney Gauze and left arthroplasty bipolar hip on 08/08/2012 by Dr. Ronnie Derby  EGD 08/10/2012 by Dr. Benson Norway  Incision and drainage of right neck and mediastinum with a transcervical approach as well as cervical esophagoscopy on [id number] by Dr. Erik Obey  Antibiotics: Zosyn 10/11 >>>  HPI/Subjective: Patient out of bed to chair with assistance of physical therapy. Complains of upper airway congestion that she is unable to cough up. Is complaining of surgical site pain. Denies shortness of breath or chest pain   Objective: Blood pressure 129/52, pulse 74, temperature 98.3 F (36.8 C), temperature source Oral, resp. rate 23, height 5\' 5"  (1.651 m), weight 77 kg (169 lb 12.1 oz), SpO2 97.00%.  Intake/Output Summary (Last 24 hours) at 08/15/12 1316 Last data filed at 08/15/12 0700  Gross per 24 hour  Intake   1260 ml  Output    650 ml  Net    610 ml     Exam: General: Minimal respiratory distress ENT: Neck incisions covered by dressing which is clean dry and intact Lungs: Scattered bilateral crackles and diminished in the bases, nasal cannula oxygen Cardiovascular: Regular rate and rhythm without murmur gallop or rub normal S1 and S2, trace peripheral edema Abdomen: Nontender, nondistended, soft, bowel sounds positive, no rebound, no ascites, no appreciable mass Musculoskeletal: No significant cyanosis, clubbing of bilateral lower extremities; cast to left forearm and wrist Neurological: Patient is awake and oriented x3, she spontaneously moves extremities x4 but formal strength exam was not completed, there is no appearance of any  focal neurological deficits  appreciated  Data Reviewed: Basic Metabolic Panel:  Lab [id number] 08/14/12 0550 08/13/12 0400 08/12/12 0450 08/11/12 0528  NA 144 143 142 142 141  K 3.5 3.7 3.9 3.6 4.5  CL 103 105 105 108 107  CO2 38* 32 30 29 26   GLUCOSE 159* 155* 191* 156* 165*  BUN 38* 33* 32* 28* 25*  CREATININE 0.90 0.83 0.96 0.98 1.16*  CALCIUM 9.5 9.6 9.0 9.2 9.2  MG 1.7 1.9 1.9 2.1 --  PHOS 3.0 2.2* 2.7 1.9* --   Liver Function Tests:  Lab 08/13/12 0400 08/12/12 0450 08/11/12 0528  AST 33 17 27  ALT 14 7 9   ALKPHOS 221* 92 121*  BILITOT 1.0 0.5 0.6  PROT 5.1* 5.1* 5.7*  ALBUMIN 1.8* 1.8* 2.2*   CBC:  Lab 08/14/12 0444 08/13/12 0400 08/12/12 0730 08/12/12 0450 08/11/12 0528  WBC 7.2 8.2 8.9 8.0 10.0  NEUTROABS -- 6.8 -- 6.7 --  HGB 9.1* 9.6* 6.1* 6.9* 8.6*  HCT 35.1* 28.9* 18.8* 21.1* 26.2*  MCV 117.4* 88.1 91.7 91.3 91.3  PLT 171 157 179 145* 145*   CBG:  Lab 08/15/12 1134 08/15/12 0748 08/15/12 0356 08/14/12 2348 08/14/12 2002  GLUCAP 161* 144* 141* 173* 172*    Recent Results (from the past 240 hour(s))  URINE CULTURE     Status: Normal   Collection Time   08/07/12 11:24 AM      Component Value Range Status Comment   Specimen Description URINE, CATHETERIZED   Final    Special Requests NONE   Final    Culture  Setup Time 08/07/2012 16:50   Final    Colony Count NO GROWTH   Final    Culture NO GROWTH   Final    Report Status 08/08/2012 FINAL   Final   SURGICAL PCR SCREEN     Status: Normal   Collection Time   08/08/12  5:32 AM      Component Value Range Status Comment   MRSA, PCR NEGATIVE  NEGATIVE Final    Staphylococcus aureus NEGATIVE  NEGATIVE Final   WOUND CULTURE     Status: Normal   Collection Time   08/10/12 10:29 PM      Component Value Range Status Comment   Specimen Description WOUND ESOPHAGUS   Final    Special Requests POF ZOSYN   Final    Gram Stain     Final    Value: FEW WBC PRESENT, PREDOMINANTLY PMN     RARE SQUAMOUS  EPITHELIAL CELLS PRESENT     ABUNDANT GRAM POSITIVE RODS     FEW GRAM NEGATIVE RODS   Culture     Final    Value: MULTIPLE ORGANISMS PRESENT, NONE PREDOMINANT NO STAPHYLOCOCCUS AUREUS ISOLATED NO GROUP A STREP (S.PYOGENES) ISOLATED   Report Status 08/14/2012 FINAL   Final   ANAEROBIC CULTURE     Status: Normal   Collection Time   08/10/12 10:29 PM      Component Value Range Status Comment   Specimen Description WOUND ESOPHAGUS   Final    Special Requests PATIENT ON FOLLOWING ZOSYN   Final    Gram Stain     Final    Value: FEW WBC PRESENT, PREDOMINANTLY PMN     RARE SQUAMOUS EPITHELIAL CELLS PRESENT     ABUNDANT GRAM POSITIVE RODS     FEW GRAM NEGATIVE RODS   Culture NO ANAEROBES ISOLATED   Final    Report Status 08/15/2012 FINAL   Final  Studies:  Recent x-ray studies have been reviewed in detail by the Attending Physician  Scheduled Meds:  Reviewed in detail by the Attending Physician   Erin Hearing, [place] [facility] Office  [phone] Pager [phone]  On-Call/Text Page:      [email or web address]      password [email or web address]  If 7PM-7AM, please contact night-coverage [email or web address] Password [email or web address] 08/15/2012, 1:16 PM   LOS: 8 days    I have personally examined this patient and reviewed the entire database. I have reviewed the above note, made any necessary editorial changes, and agree with its content.  Cherene Altes, MD [facility]

## 2012-08-15 NOTE — Progress Notes (Signed)
Physical Therapy Treatment Patient Details Name: Niger M Leatham MRN: [id number] DOB: 06/26/1922 Today's Date: 08/15/2012 Time: [id number] PT Time Calculation (min): 44 min  PT Assessment / Plan / Recommendation Comments on Treatment Session  Pt. s/p L hip hemiarthroplasty and L forearm ORIF.  Post op esophageal tear which required surgery 08/08/12.  Pt continues to progress.      Follow Up Recommendations  Post acute inpatient     Does the patient have the potential to tolerate intense rehabilitation  No, Recommend LTACH  Barriers to Discharge        Equipment Recommendations  None recommended by PT    Recommendations for Other Services    Frequency Min 4X/week   Plan Discharge plan remains appropriate;Frequency remains appropriate    Precautions / Restrictions Precautions Precautions: Fall;Posterior Hip Precaution Comments: pt aware of hip precautions Restrictions LUE Weight Bearing: Weight bear through elbow only LLE Weight Bearing: Weight bearing as tolerated   Pertinent Vitals/Pain VSS, some pain    Mobility  Bed Mobility Bed Mobility: Rolling Left;Left Sidelying to Sit;Sitting - Scoot to [employer] of Bed Rolling Right: Not tested (comment) Rolling Left: 1: +2 Total assist Rolling Left: Patient Percentage: 30% Left Sidelying to Sit: 1: +2 Total assist;HOB elevated Left Sidelying to Sit: Patient Percentage: 30% Supine to Sit: Not tested (comment) Sitting - Scoot to Edge of Bed: 3: Mod assist Sit to Supine: Not Tested (comment) Details for Bed Mobility Assistance: Asssit for elevation of trunk and LES. Transfers Transfers: Sit to Stand;Stand to Sit;Stand Pivot Transfers Sit to Stand: 1: +2 Total assist;With upper extremity assist;From bed Sit to Stand: Patient Percentage: 50% Stand to Sit: 1: +2 Total assist;With upper extremity assist;With armrests;To chair/3-in-1 Stand to Sit: Patient Percentage: 50% Stand Pivot Transfers: 1: +2 Total assist Stand Pivot Transfers:  Patient Percentage: 50% Details for Transfer Assistance: Physical asssit needed to rise, stabilize and control descent.  Assisted to recliner and then patient had to urinate.  Assisted to 3N1.  Pt. able to weight bear on bilateral LEs and pivot to each surface.   Ambulation/Gait Ambulation/Gait Assistance: Not tested (comment) Stairs: No Wheelchair Mobility Wheelchair Mobility: No    PT Goals Acute Rehab PT Goals PT Goal: Supine/Side to Sit - Progress: Progressing toward goal PT Goal: Sit to Stand - Progress: Progressing toward goal PT Goal: Stand to Sit - Progress: Progressing toward goal PT Transfer Goal: Bed to Chair/Chair to Bed - Progress: Progressing toward goal  Visit Information  Last PT Received On: 08/15/12 Assistance Needed: +2    Subjective Data  Subjective: "I will try."   Cognition  Overall Cognitive Status: Appears within functional limits for tasks assessed/performed Arousal/Alertness: Awake/alert Orientation Level: Appears intact for tasks assessed Behavior During Session: [facility] for tasks performed    Balance  Static Sitting Balance Static Sitting - Balance Support: Right upper extremity supported;Feet supported Static Sitting - Level of Assistance: 5: Stand by assistance  End of Session PT - End of Session Equipment Utilized During Treatment: Gait belt;Oxygen Activity Tolerance: Patient limited by fatigue Patient left: in chair;with call bell/phone within reach;with family/visitor present Nurse Communication: Mobility status       INGOLD,Trysten Berti 08/15/2012, 12:20 PM  [facility] Acute Rehabilitation [phone] (pager)

## 2012-08-15 NOTE — Progress Notes (Signed)
Patient ID: Amy [name], female   DOB: Jul 24, 1922, 78 y.o.   MRN: [id number] Left distal radius ORIF dressing changed at bedside  Incision clean and dry  No signs of infection  Minimal swelling  Will start OT tomorrow for active ROM and will have orthoplast splint fabricated then   Ok to weight bear when splint on.  Ok to shower but do not soak incision  Will need to see in my office next week

## 2012-08-15 NOTE — Progress Notes (Signed)
08/15/2012 8:24 AM  [name], [name]  Post-Op Day 5    Temp:  [97.6 F (36.4 C)-98.4 F (36.9 C)] 98.2 F (36.8 C) (10/16 0407) Pulse Rate:  [72-116] 83  (10/16 0700) Resp:  [18-27] 24  (10/16 0700) BP: (140-170)/(57-89) 152/65 mmHg (10/16 0700) SpO2:  [92 %-100 %] 95 % (10/16 0700),     Intake/Output Summary (Last 24 hours) at 08/15/12 0824 Last data filed at 08/15/12 0700  Gross per 24 hour  Intake   1858 ml  Output   1950 ml  Net    -92 ml    Results for orders placed during the hospital encounter of 08/07/12 (from the past 24 hour(s))  GLUCOSE, CAPILLARY     Status: Abnormal   Collection Time   08/14/12 12:26 PM      Component Value Range   Glucose-Capillary 181 (*) 70 - 99 mg/dL   Comment 1 Notify RN     Comment 2 Documented in Chart    GLUCOSE, CAPILLARY     Status: Abnormal   Collection Time   08/14/12  4:33 PM      Component Value Range   Glucose-Capillary 191 (*) 70 - 99 mg/dL   Comment 1 Notify RN     Comment 2 Documented in Chart    GLUCOSE, CAPILLARY     Status: Abnormal   Collection Time   08/14/12  8:02 PM      Component Value Range   Glucose-Capillary 172 (*) 70 - 99 mg/dL   Comment 1 Documented in Chart     Comment 2 Notify RN    GLUCOSE, CAPILLARY     Status: Abnormal   Collection Time   08/14/12 11:48 PM      Component Value Range   Glucose-Capillary 173 (*) 70 - 99 mg/dL   Comment 1 Notify RN     Comment 2 Documented in Chart    GLUCOSE, CAPILLARY     Status: Abnormal   Collection Time   08/15/12  3:56 AM      Component Value Range   Glucose-Capillary 141 (*) 70 - 99 mg/dL   Comment 1 Documented in Chart     Comment 2 Notify RN    BASIC METABOLIC PANEL     Status: Abnormal   Collection Time   08/15/12  5:05 AM      Component Value Range   Sodium 144  135 - 145 mEq/L   Potassium 3.5  3.5 - 5.1 mEq/L   Chloride 103  96 - 112 mEq/L   CO2 38 (*) 19 - 32 mEq/L   Glucose, Bld 159 (*) 70 - 99 mg/dL   BUN 38 (*) 6 - 23 mg/dL   Creatinine, Ser 0.90  0.50 - 1.10 mg/dL   Calcium 9.5  8.4 - 10.5 mg/dL   GFR calc non Af Amer 55 (*) >90 mL/min   GFR calc Af Amer 64 (*) >90 mL/min  MAGNESIUM     Status: Normal   Collection Time   08/15/12  5:05 AM      Component Value Range   Magnesium 1.7  1.5 - 2.5 mg/dL  PHOSPHORUS     Status: Normal   Collection Time   08/15/12  5:05 AM      Component Value Range   Phosphorus 3.0  2.3 - 4.6 mg/dL  GLUCOSE, CAPILLARY     Status: Abnormal   Collection Time   08/15/12  7:48 AM      Component  Value Range   Glucose-Capillary 144 (*) 70 - 99 mg/dL    SUBJECTIVE:  Coughing this AM.  Nurses report lg mucoid drainage on dressing.  OBJECTIVE:  Wound clean, drains in position.  IMPRESSION:  still large leakage  PLAN:  Nutrition. Oral hygiene.  Amy [name]

## 2012-08-15 NOTE — Consult Note (Signed)
Reason for Consult: Cervical esophageal perforation with mediastinitis.  Ask to see for feeding Gastrostomy. Referring Physician: McClung  Amy [name] is an 78 y.o. female.  HPI: Pt is an 78 y/o female who fell at home with no reported LOC.  She was transported from [facility] to [facility] because of her AF with RVR and fractured Left hip and left radial head.  She underwent treatment for her CADD/AF by [facility] and was transferred to [facility].  She was stabilized and underwent ORIF to distal radial head and Left hip on 08/08/12 by Dr. Burney Gauze.  She has had pain since intubation with swallowing.  She was found to have mucosal defect on EGD, followed by a gastrografin swallow which confirmed a leak into mediastinum 08/10/12., CT confirmed drainage into the mediastinum. Pt was seen by ENT and take to the OR for i/d of her right neck cervical perforation by Dr. Tonye Becket.  She has had a PICC line placed and is on TNA, but needs a feeding tube. GI is concerned with a further tear or injury doing PEG;  we have been ask to see and place gastrostomy feeding tube.  Discussed with Anesthesia and they agree.  Past Medical History  Diagnosis Date  . Hypertension Atrial fibrillation with RVR   . Hypercholesteremia   . Myocardial infarction     Inferior STEMI 07/2008 - PCI of prox RCA followed byu CABG x 3 in 9/'09  . Pneumonia   . Anxiety   . CAD in native artery     s/p CABG x 3; Last Myoview in 07/2009 - non-ischemic; Echo 03/2012  Aortic Sclerosis with normal EF.  Marland Kitchen Asthma   . Pacemaker   . GERD (gastroesophageal reflux disease)   . Arthritis   . Status post hip hemiarthroplasty left hip by Dr Ronnie Derby 08/08/2012    Past Surgical History  Procedure Date  . Coronary artery bypass graft 2009    LIMA-LAD, SVG-RI, SVG-stented RCA  . Total hip arthroplasty   . Pacemaker insertion   . Insert / replace / remove pacemaker   . Wrist fracture surgery 07/2012    left intra articular  with carpel tunnel  . Carpal tunnel release  08/08/2012    Procedure: CARPAL TUNNEL RELEASE;  Surgeon: Schuyler Amor, MD;  Location: [place];  Service: Orthopedics;  Laterality: Left;  . Hip arthroplasty 08/08/2012    Procedure: ARTHROPLASTY BIPOLAR HIP;  Surgeon: Rudean Haskell, MD;  Location: [place];  Service: Orthopedics;  Laterality: Left;  [place]   . Esophagogastroduodenoscopy 08/10/2012    Procedure: ESOPHAGOGASTRODUODENOSCOPY (EGD);  Surgeon: Beryle Beams, MD;  Location: [facility] ENDOSCOPY;  Service: Endoscopy;  Laterality: N/A;  . Esophagoscopy 08/10/2012    Procedure: ESOPHAGOSCOPY;  Surgeon: [name], MD;  Location: [facility] OR;  Service: ENT;  Laterality: N/A;    Family History  Problem Relation Age of Onset  . Hypertension Mother   . Diabetes Mother   . Cancer Mother   . Hypertension Father   . Stroke Father     Social History:  reports that she has never smoked. She has never used smokeless tobacco. She reports that she does not drink alcohol or use illicit drugs.  Allergies:  Allergies  Allergen Reactions  . Hydrocodone-Acetaminophen Itching    Tolerates tylenol    Medications:  Prior to Admission:  Prescriptions prior to admission  Medication Sig Dispense Refill  . acetaminophen (TYLENOL) 500 MG tablet Take 1,000 mg by mouth every 6 (six) hours as needed.  Pain      . aspirin 81 MG tablet Take 81 mg by mouth 2 (two) times daily.        . ciprofloxacin (CIPRO) 500 MG tablet Take 500 mg by mouth 2 (two) times daily.      . fish oil-omega-3 fatty acids 1000 MG capsule Take 4 g by mouth daily.      . hydrochlorothiazide (HYDRODIURIL) 25 MG tablet Take 25 mg by mouth daily.      . metoprolol succinate (TOPROL-XL) 25 MG 24 hr tablet Take 25 mg by mouth daily.      . niacin (NIASPAN) 500 MG CR tablet Take 500 mg by mouth at bedtime.        Marland Kitchen omeprazole (PRILOSEC) 20 MG capsule Take 20 mg by mouth daily.        . valsartan (DIOVAN) 160 MG tablet Take 160 mg by mouth daily.         Scheduled:   . bacitracin  1  application Topical BID  . chlorhexidine  5 mL Mouth/Throat QID  . furosemide      . furosemide  40 mg Intravenous Daily  . insulin aspart  0-9 Units Subcutaneous Q4H  . magnesium sulfate 1 - 4 g bolus IVPB  2 g Intravenous Once  . metoprolol  5 mg Intravenous Q4H  . pantoprazole (PROTONIX) IV  40 mg Intravenous Q12H  . piperacillin-tazobactam (ZOSYN)  IV  3.375 g Intravenous Q8H  . potassium chloride  10 mEq Intravenous Q1 Hr x 3  . sodium chloride  10-40 mL Intracatheter Q12H  . sodium chloride  3 mL Intravenous Q12H  . sodium glycerophosphate 0.9% NaCl IVPB  10 mmol Intravenous Once  . DISCONTD: metoprolol  5 mg Intravenous Q6H   Continuous:   . sodium chloride 35 mL/hr at 08/13/12 0740  . fat emulsion 240 mL (08/14/12 0600)  . fat emulsion    . TPN (CLINIMIX) +/- additives 65 mL/hr at 08/14/12 0600  . TPN (CLINIMIX) +/- additives 65 mL/hr at 08/14/12 1715  . TPN (CLINIMIX) +/- additives     [id number], hydrALAZINE, LORazepam, morphine injection, ondansetron (ZOFRAN) IV, ondansetron, sodium chloride Anti-infectives     Start     Dose/Rate Route Frequency Ordered Stop   08/10/12 1830   piperacillin-tazobactam (ZOSYN) IVPB 3.375 g        3.375 g 12.5 mL/hr over 240 Minutes Intravenous 3 times per day 08/10/12 1730            Results for orders placed during the hospital encounter of 08/07/12 (from the past 48 hour(s))  GLUCOSE, CAPILLARY     Status: Abnormal   Collection Time   08/13/12  4:04 PM      Component Value Range Comment   Glucose-Capillary 179 (*) 70 - 99 mg/dL    Comment 1 Notify RN     GLUCOSE, CAPILLARY     Status: Abnormal   Collection Time   08/13/12  7:42 PM      Component Value Range Comment   Glucose-Capillary 178 (*) 70 - 99 mg/dL   GLUCOSE, CAPILLARY     Status: Abnormal   Collection Time   08/14/12 12:20 AM      Component Value Range Comment   Glucose-Capillary 194 (*) 70 - 99 mg/dL   GLUCOSE, CAPILLARY     Status: Abnormal   Collection  Time   08/14/12  4:12 AM      Component Value Range Comment   Glucose-Capillary 174 (*)  70 - 99 mg/dL   CBC     Status: Abnormal   Collection Time   08/14/12  4:44 AM      Component Value Range Comment   WBC 7.2  4.0 - 10.5 K/uL    RBC 2.99 (*) 3.87 - 5.11 MIL/uL    Hemoglobin 9.1 (*) 12.0 - 15.0 g/dL    HCT 35.1 (*) 36.0 - 46.0 %    MCV 117.4 (*) 78.0 - 100.0 fL    MCH 30.4  26.0 - 34.0 pg    MCHC 25.9 (*) 30.0 - 36.0 g/dL    RDW 17.4 (*) 11.5 - 15.5 %    Platelets 171  150 - 400 K/uL   BASIC METABOLIC PANEL     Status: Abnormal   Collection Time   08/14/12  5:50 AM      Component Value Range Comment   Sodium 143  135 - 145 mEq/L    Potassium 3.7  3.5 - 5.1 mEq/L    Chloride 105  96 - 112 mEq/L    CO2 32  19 - 32 mEq/L    Glucose, Bld 155 (*) 70 - 99 mg/dL    BUN 33 (*) 6 - 23 mg/dL    Creatinine, Ser 0.83  0.50 - 1.10 mg/dL    Calcium 9.6  8.4 - 10.5 mg/dL    GFR calc non Af Amer 61 (*) >90 mL/min    GFR calc Af Amer 71 (*) >90 mL/min   MAGNESIUM     Status: Normal   Collection Time   08/14/12  5:50 AM      Component Value Range Comment   Magnesium 1.9  1.5 - 2.5 mg/dL   PHOSPHORUS     Status: Abnormal   Collection Time   08/14/12  5:50 AM      Component Value Range Comment   Phosphorus 2.2 (*) 2.3 - 4.6 mg/dL   GLUCOSE, CAPILLARY     Status: Abnormal   Collection Time   08/14/12  5:56 AM      Component Value Range Comment   Glucose-Capillary 137 (*) 70 - 99 mg/dL   GLUCOSE, CAPILLARY     Status: Abnormal   Collection Time   08/14/12  7:31 AM      Component Value Range Comment   Glucose-Capillary 160 (*) 70 - 99 mg/dL    Comment 1 Notify RN      Comment 2 Documented in Chart     GLUCOSE, CAPILLARY     Status: Abnormal   Collection Time   08/14/12 12:26 PM      Component Value Range Comment   Glucose-Capillary 181 (*) 70 - 99 mg/dL    Comment 1 Notify RN      Comment 2 Documented in Chart     GLUCOSE, CAPILLARY     Status: Abnormal   Collection Time    08/14/12  4:33 PM      Component Value Range Comment   Glucose-Capillary 191 (*) 70 - 99 mg/dL    Comment 1 Notify RN      Comment 2 Documented in Chart     GLUCOSE, CAPILLARY     Status: Abnormal   Collection Time   08/14/12  8:02 PM      Component Value Range Comment   Glucose-Capillary 172 (*) 70 - 99 mg/dL    Comment 1 Documented in Chart      Comment 2 Notify RN     GLUCOSE, CAPILLARY  Status: Abnormal   Collection Time   08/14/12 11:48 PM      Component Value Range Comment   Glucose-Capillary 173 (*) 70 - 99 mg/dL    Comment 1 Notify RN      Comment 2 Documented in Chart     GLUCOSE, CAPILLARY     Status: Abnormal   Collection Time   08/15/12  3:56 AM      Component Value Range Comment   Glucose-Capillary 141 (*) 70 - 99 mg/dL    Comment 1 Documented in Chart      Comment 2 Notify RN     BASIC METABOLIC PANEL     Status: Abnormal   Collection Time   08/15/12  5:05 AM      Component Value Range Comment   Sodium 144  135 - 145 mEq/L    Potassium 3.5  3.5 - 5.1 mEq/L    Chloride 103  96 - 112 mEq/L    CO2 38 (*) 19 - 32 mEq/L    Glucose, Bld 159 (*) 70 - 99 mg/dL    BUN 38 (*) 6 - 23 mg/dL    Creatinine, Ser 0.90  0.50 - 1.10 mg/dL    Calcium 9.5  8.4 - 10.5 mg/dL    GFR calc non Af Amer 55 (*) >90 mL/min    GFR calc Af Amer 64 (*) >90 mL/min   MAGNESIUM     Status: Normal   Collection Time   08/15/12  5:05 AM      Component Value Range Comment   Magnesium 1.7  1.5 - 2.5 mg/dL   PHOSPHORUS     Status: Normal   Collection Time   08/15/12  5:05 AM      Component Value Range Comment   Phosphorus 3.0  2.3 - 4.6 mg/dL   GLUCOSE, CAPILLARY     Status: Abnormal   Collection Time   08/15/12  7:48 AM      Component Value Range Comment   Glucose-Capillary 144 (*) 70 - 99 mg/dL   GLUCOSE, CAPILLARY     Status: Abnormal   Collection Time   08/15/12 11:34 AM      Component Value Range Comment   Glucose-Capillary 161 (*) 70 - 99 mg/dL    Comment 1 Notify RN        No results found.  Review of Systems  Constitutional: Negative.        She was doing well with no significant complaints prior to fall which led to her admission.  HENT:       Esophageal spasms earlier today, very painful and she was very anxious thought she would choke, resolved now.  Eyes: Negative.   Respiratory: Negative.   Cardiovascular: Negative.   Gastrointestinal: Negative.   Genitourinary: Negative.   Musculoskeletal: Negative.   Skin: Negative.   Neurological: Negative.   Endo/Heme/Allergies: Negative.   Psychiatric/Behavioral: Negative.    Blood pressure 129/52, pulse 74, temperature 98.3 F (36.8 C), temperature source Oral, resp. rate 23, height 5\' 5"  (1.651 m), weight 77 kg (169 lb 12.1 oz), SpO2 97.00%. Physical Exam  Constitutional: She is oriented to person, place, and time. She appears well-developed. No distress.       Frail 69 y/0 female, NAD currently.  HENT:  Head: Normocephalic.  Nose: Nose normal.       Large dressing Right neck  Eyes: Conjunctivae normal and EOM are normal. Pupils are equal, round, and reactive to light. Right eye exhibits no  discharge. Left eye exhibits no discharge. No scleral icterus.  Neck: Normal range of motion. No JVD present. No tracheal deviation present. No thyromegaly present.       Large dressing right neck, some limited ROM, minor discomfort.  Cardiovascular: Normal rate, regular rhythm, normal heart sounds and intact distal pulses.  Exam reveals no gallop and no friction rub.   No murmur heard. Respiratory: Effort normal. No stridor. No respiratory distress. She has no wheezes. She has rales (few in bases.). She exhibits no tenderness.  GI: Soft. Bowel sounds are normal. She exhibits no distension and no mass. There is no tenderness. There is no rebound and no guarding.  Musculoskeletal: Normal range of motion. She exhibits no edema.       She has large dressing on Left arm with splint. Left hip s/p ORIF with dressing  in place.  Lymphadenopathy:    She has no cervical adenopathy.  Neurological: She is alert and oriented to person, place, and time. She has normal reflexes. No cranial nerve deficit.  Skin: Skin is warm and dry. No rash noted. She is not diaphoretic. No erythema. No pallor.  Psychiatric: She has a normal mood and affect. Her behavior is normal. Judgment and thought content normal.    Assessment/Plan: 1. Contained esophageal perforation with mediastinitis. 2. Fall with fracture Left hip/left radial with ORIF left hip and radial head 08/08/12. 3. Post op ARF with hypoxia/pulmonary edema. 4.PCM/TNA 5.Decondtioning 6.CAD with hx of MI, stents, CABG  7.AF with RVR/Aortic sclerosis no stenosis 8.Hypertension 9. Anemia   Plan:  Discussed with Pt and her sister.  They are agreeable to surgical placement on gastrostomy feeding tube.  Dr. Grandville Silos will review and we hope to do this tomorrow.  Tavaughn Silguero 08/15/2012, 3:03 PM

## 2012-08-15 NOTE — Progress Notes (Signed)
Clinical Social Work  CSW reviewed chart and met with patient and dtr at bedside. Family still agreeable to [facility] Nursing at dc. CSW called SNF and updated facility on plans. SNF still agreeable for admission for ST stay. CSW agreed to keep facility updated. CSW will continue to follow.  [place], [place] [phone]

## 2012-08-15 NOTE — Progress Notes (Signed)
Received report from [facility] and assumed pt care at 1600hrs.  Assessment agrees with am assessment.  Pt denies pain at this time.

## 2012-08-15 NOTE — Progress Notes (Signed)
TCTS BRIEF PROGRESS NOTE   Progress noted.  Will continue to follow intermittently.  It might be prudent to consider PEG placement or fluoroscopically-placed transnasal feeding tube for enteral support.  OWEN,CLARENCE H 08/15/2012 8:35 AM

## 2012-08-16 ENCOUNTER — Encounter (HOSPITAL_COMMUNITY): Payer: Self-pay | Admitting: Certified Registered"

## 2012-08-16 ENCOUNTER — Encounter (HOSPITAL_COMMUNITY)
Admission: EM | Disposition: A | Discharge status: Other Acute Inpatient Hospital | Payer: Self-pay | Source: Home / Self Care | Attending: Internal Medicine

## 2012-08-16 ENCOUNTER — Inpatient Hospital Stay (HOSPITAL_COMMUNITY): Payer: Medicare Other | Admitting: Certified Registered"

## 2012-08-16 DIAGNOSIS — I4891 Unspecified atrial fibrillation: Secondary | ICD-10-CM

## 2012-08-16 HISTORY — PX: GASTROSTOMY: SHX5249

## 2012-08-16 LAB — COMPREHENSIVE METABOLIC PANEL
ALT: 28 U/L (ref 0–35)
AST: 36 U/L (ref 0–37)
Albumin: 2.1 g/dL — ABNORMAL LOW (ref 3.5–5.2)
Alkaline Phosphatase: 256 U/L — ABNORMAL HIGH (ref 39–117)
Potassium: 3.5 mEq/L (ref 3.5–5.1)
Sodium: 142 mEq/L (ref 135–145)
Total Protein: 5.7 g/dL — ABNORMAL LOW (ref 6.0–8.3)

## 2012-08-16 LAB — CBC
Hemoglobin: 11.4 g/dL — ABNORMAL LOW (ref 12.0–15.0)
MCH: 29.3 pg (ref 26.0–34.0)
MCV: 91.3 fL (ref 78.0–100.0)
RBC: 3.89 MIL/uL (ref 3.87–5.11)

## 2012-08-16 LAB — GLUCOSE, CAPILLARY
Glucose-Capillary: 167 mg/dL — ABNORMAL HIGH (ref 70–99)
Glucose-Capillary: 159 mg/dL — ABNORMAL HIGH (ref 70–99)
Glucose-Capillary: 188 mg/dL — ABNORMAL HIGH (ref 70–99)
Glucose-Capillary: 189 mg/dL — ABNORMAL HIGH (ref 70–99)
Glucose-Capillary: 172 mg/dL — ABNORMAL HIGH (ref 70–99)

## 2012-08-16 LAB — VITAMIN B12: Vitamin B-12: 299 pg/mL (ref 211–911)

## 2012-08-16 SURGERY — INSERTION OF GASTROSTOMY TUBE
Anesthesia: General | Site: Abdomen | Wound class: Clean Contaminated

## 2012-08-16 MED ORDER — PHENYLEPHRINE HCL 10 MG/ML IJ SOLN
10.0000 mg | INTRAVENOUS | Status: DC | PRN
  Administered 2012-08-16: 50 ug/min via INTRAVENOUS

## 2012-08-16 MED ORDER — CLINIMIX E/DEXTROSE (5/20) 5 % IV SOLN
INTRAVENOUS | Status: AC
  Administered 2012-08-16: 18:00:00 via INTRAVENOUS
  Filled 2012-08-16: qty 2000

## 2012-08-16 MED ORDER — FENTANYL CITRATE 0.05 MG/ML IJ SOLN
INTRAMUSCULAR | Status: DC | PRN
  Administered 2012-08-16 (×3): 25 ug via INTRAVENOUS

## 2012-08-16 MED ORDER — ONDANSETRON HCL 4 MG/2ML IJ SOLN
4.0000 mg | Freq: Once | INTRAMUSCULAR | Status: AC | PRN
  Administered 2012-08-16: 4 mg via INTRAVENOUS

## 2012-08-16 MED ORDER — HYDROMORPHONE HCL PF 1 MG/ML IJ SOLN
0.2500 mg | INTRAMUSCULAR | Status: DC | PRN
  Administered 2012-08-16: 0.5 mg via INTRAVENOUS

## 2012-08-16 MED ORDER — ONDANSETRON HCL 4 MG/2ML IJ SOLN
INTRAMUSCULAR | Status: DC | PRN
  Administered 2012-08-16: 4 mg via INTRAVENOUS

## 2012-08-16 MED ORDER — PROPOFOL 10 MG/ML IV BOLUS
INTRAVENOUS | Status: DC | PRN
  Administered 2012-08-16: 110 mg via INTRAVENOUS

## 2012-08-16 MED ORDER — POTASSIUM CHLORIDE 10 MEQ/50ML IV SOLN
10.0000 meq | INTRAVENOUS | Status: AC
  Filled 2012-08-16 (×3): qty 50

## 2012-08-16 MED ORDER — SUCCINYLCHOLINE CHLORIDE 20 MG/ML IJ SOLN
INTRAMUSCULAR | Status: DC | PRN
  Administered 2012-08-16: 100 mg via INTRAVENOUS

## 2012-08-16 MED ORDER — PHENYLEPHRINE HCL 10 MG/ML IJ SOLN
INTRAMUSCULAR | Status: DC | PRN
  Administered 2012-08-16 (×3): 80 ug via INTRAVENOUS

## 2012-08-16 MED ORDER — ARTIFICIAL TEARS OP OINT
TOPICAL_OINTMENT | OPHTHALMIC | Status: DC | PRN
  Administered 2012-08-16: 1 via OPHTHALMIC

## 2012-08-16 MED ORDER — ACETAMINOPHEN 10 MG/ML IV SOLN: 1000.0000 mg | Freq: Once | INTRAVENOUS | Status: DC | PRN

## 2012-08-16 MED ORDER — MAGNESIUM SULFATE 40 MG/ML IJ SOLN
2.0000 g | Freq: Once | INTRAMUSCULAR | Status: DC
  Filled 2012-08-16: qty 50

## 2012-08-16 MED ORDER — LIDOCAINE HCL 4 % MT SOLN
OROMUCOSAL | Status: DC | PRN
  Administered 2012-08-16: 4 mL via TOPICAL

## 2012-08-16 MED ORDER — MORPHINE SULFATE 2 MG/ML IJ SOLN
2.0000 mg | INTRAMUSCULAR | Status: DC | PRN
  Administered 2012-08-16 – 2012-08-17 (×8): 2 mg via INTRAVENOUS
  Administered 2012-08-17: 3 mg via INTRAVENOUS
  Administered 2012-08-18: 4 mg via INTRAVENOUS
  Administered 2012-08-18: 2 mg via INTRAVENOUS
  Administered 2012-08-18: 4 mg via INTRAVENOUS
  Administered 2012-08-20: 2 mg via INTRAVENOUS
  Filled 2012-08-16 (×2): qty 1
  Filled 2012-08-16: qty 2
  Filled 2012-08-16 (×9): qty 1
  Filled 2012-08-16 (×2): qty 2
  Filled 2012-08-16 (×2): qty 1
  Filled 2012-08-16: qty 2

## 2012-08-16 MED ORDER — LACTATED RINGERS IV SOLN
INTRAVENOUS | Status: DC | PRN
  Administered 2012-08-16: 10:00:00 via INTRAVENOUS

## 2012-08-16 SURGICAL SUPPLY — 38 items
CANISTER SUCTION 2500CC (MISCELLANEOUS) ×2 IMPLANT
CATH MALECOT BARD  24FR (CATHETERS)
CATH MALECOT BARD 24FR (CATHETERS) IMPLANT
CHLORAPREP W/TINT 26ML (MISCELLANEOUS) ×2 IMPLANT
CLOTH BEACON ORANGE TIMEOUT ST (SAFETY) ×2 IMPLANT
COVER SURGICAL LIGHT HANDLE (MISCELLANEOUS) ×2 IMPLANT
DRAPE LAPAROSCOPIC ABDOMINAL (DRAPES) ×2 IMPLANT
DRAPE UTILITY 15X26 W/TAPE STR (DRAPE) ×4 IMPLANT
ELECT CAUTERY BLADE 6.4 (BLADE) ×2 IMPLANT
ELECT REM PT RETURN 9FT ADLT (ELECTROSURGICAL) ×2
ELECTRODE REM PT RTRN 9FT ADLT (ELECTROSURGICAL) ×1 IMPLANT
GLOVE BIO SURGEON STRL SZ8 (GLOVE) ×4 IMPLANT
GLOVE BIOGEL PI IND STRL 8 (GLOVE) ×1 IMPLANT
GLOVE BIOGEL PI INDICATOR 8 (GLOVE) ×1
GOWN PREVENTION PLUS XLARGE (GOWN DISPOSABLE) ×2 IMPLANT
GOWN STRL NON-REIN LRG LVL3 (GOWN DISPOSABLE) ×2 IMPLANT
KIT BASIN OR (CUSTOM PROCEDURE TRAY) ×2 IMPLANT
KIT ROOM TURNOVER OR (KITS) ×2 IMPLANT
NEEDLE 22X1 1/2 (OR ONLY) (NEEDLE) IMPLANT
NS IRRIG 1000ML POUR BTL (IV SOLUTION) ×2 IMPLANT
PACK GENERAL/GYN (CUSTOM PROCEDURE TRAY) ×2 IMPLANT
PAD ARMBOARD 7.5X6 YLW CONV (MISCELLANEOUS) ×4 IMPLANT
PLUG CATH AND CAP STER (CATHETERS) IMPLANT
SPONGE GAUZE 4X4 12PLY (GAUZE/BANDAGES/DRESSINGS) ×2 IMPLANT
STAPLER VISISTAT 35W (STAPLE) ×2 IMPLANT
SUCTION POOLE TIP (SUCTIONS) IMPLANT
SUT PDS AB 1 CTX 36 (SUTURE) ×4 IMPLANT
SUT SILK 2 0 (SUTURE) ×2
SUT SILK 2 0 SH (SUTURE) ×4 IMPLANT
SUT SILK 2-0 18XBRD TIE 12 (SUTURE) ×2 IMPLANT
SUT SILK 3 0 (SUTURE) ×1
SUT SILK 3-0 18XBRD TIE 12 (SUTURE) ×1 IMPLANT
SYR CONTROL 10ML LL (SYRINGE) IMPLANT
SYRINGE TOOMEY DISP (SYRINGE) ×2 IMPLANT
TAPE CLOTH SURG 6X10 WHT LF (GAUZE/BANDAGES/DRESSINGS) ×2 IMPLANT
TOWEL OR 17X24 6PK STRL BLUE (TOWEL DISPOSABLE) ×2 IMPLANT
TOWEL OR 17X26 10 PK STRL BLUE (TOWEL DISPOSABLE) ×2 IMPLANT
WATER STERILE IRR 1000ML POUR (IV SOLUTION) ×2 IMPLANT

## 2012-08-16 NOTE — Progress Notes (Signed)
PARENTERAL NUTRITION CONSULT NOTE - FOLLOW UP  Pharmacy Consult for TPN Indication: Esophageal perforation, s/p esophagoscopy and I&D  Allergies  Allergen Reactions  . Hydrocodone-Acetaminophen Itching    Tolerates tylenol    Patient Measurements: Height: 5\' 5"  (165.1 cm) Weight: 164 lb 9.6 oz (74.662 kg) IBW/kg (Calculated) : 57  Adjusted Body Weight: 65 Kg  Vital Signs: Temp: 98.7 F (37.1 C) (10/17 0753) Temp src: Oral (10/17 0753) BP: 153/65 mmHg (10/17 0753) Pulse Rate: 79  (10/17 0753) Intake/Output from previous day: 10/16 0701 - 10/17 0700 In: 1512.5 [I.V.:220; IV Piggyback:112.5; TPN:1180] Out: 2100 [Urine:2100] Intake/Output from this shift:    Labs:  [facility] 08/16/12 0500 08/14/12 0444  WBC 8.2 7.2  HGB 11.4* 9.1*  HCT 35.5* 35.1*  PLT 216 171  APTT -- --  INR -- --     Basename 08/16/12 0500 08/15/12 0505 08/14/12 0550  NA 142 144 143  K 3.5 3.5 3.7  CL 98 103 105  CO2 41* 38* 32  GLUCOSE 152* 159* 155*  BUN 37* 38* 33*  CREATININE 0.88 0.90 0.83  LABCREA -- -- --  CREAT24HRUR -- -- --  CALCIUM 9.6 9.5 9.6  MG 1.9 1.7 1.9  PHOS 3.1 3.0 2.2*  PROT 5.7* -- --  ALBUMIN 2.1* -- --  AST 36 -- --  ALT 28 -- --  ALKPHOS 256* -- --  BILITOT 1.0 -- --  BILIDIR -- -- --  IBILI -- -- --  PREALBUMIN -- -- --  TRIG -- -- --  CHOLHDL -- -- --  CHOL -- -- --   Estimated Creatinine Clearance: 44.7 ml/min (by C-G formula based on Cr of 0.88).    Basename 08/16/12 0755 08/16/12 0547 08/16/12 0147  GLUCAP 148* 159* 167*    Insulin Requirements in the past 24 hours:  5 units SSI  Current Nutrition:  TPN  Nutritional Goals:  1550-1800 kCal, 77-90 grams of protein per day per RD assessment  Assessment:  Patient presented to [facility] s/p fall and fractures needing repair, she was transferred here for hip and wrist fracture repair. S/p fracture repair she complained of odynophagia and underwent upper endoscopy per GI, which showed a contained  esophageal perforation. She is s/p Incision and drainage, right neck and mediastinum.   GI: Currently NPO, plans to keep this way for now to allow healing of esophageal tear, plans for gastrograffin swallow in a few days. Noted patient has been without significant nutrients since 10/8 until TPN started 10/12. GI states that endoscopic placement of a gastric tube is contraindicated and that surgery should be consulted for open gastrostomy tube placement.  Endo: No hx DM, A1c 5.5% at BL, CBG stable (range 134-167) - SSI started 10/14 Lytes: K 3.5, Phos 3.1, Mg 1.9 Renal: Scr stable at 0.88 today, UOP 1.2 - daily lasix Pulm: 2L [place]  Cards: BP 153/65, HR 79 - continues on IV lopressor  Hepatobil: LFTs except alk phos is slightly elevated (trending up), cholesterol and triglycerides are WNL as of 10/13, prealbumin low at 4.3 as of 10/13 ID: [id number] empirically for esophageal perf. WBC wnl, afebrile.  Best Practices: SCDs, PPI IV TPN Access: Triple lumen PICC  TPN day#: 6  Plan:  - Continue Clinimix E 5/20 at 65cc/hr (Provides goal protein of 78gm/day and average est Kcal of 1552 Kcal based on three times a week lipid supplementation) - Will supplement MVI, trace elements and IV fats on MWF only d/t ongoing Engineer, manufacturing systems.  - Repeat Magnesium  2gm IV x 1 - Repeat KCl 9meq Q1H x 3 runs - F/u AM labs and CBGs - f/u TF plans  Amy [name], PharmD, BCPS Pager # [phone] 08/16/2012 8:10 AM

## 2012-08-16 NOTE — Anesthesia Preprocedure Evaluation (Addendum)
Anesthesia Evaluation  Patient identified by MRN, date of birth, ID band Patient awake    Reviewed: Allergy & Precautions, H&P , NPO status , Patient's Chart, lab work & pertinent test results  Airway Mallampati: II      Dental  (+) Teeth Intact and Dental Advisory Given   Pulmonary  breath sounds clear to auscultation  + decreased breath sounds      Cardiovascular Rhythm:Regular Rate:Normal     Neuro/Psych    GI/Hepatic   Endo/Other    Renal/GU      Musculoskeletal   Abdominal   Peds  Hematology   Anesthesia Other Findings   Reproductive/Obstetrics                           Anesthesia Physical Anesthesia Plan  ASA: III  Anesthesia Plan: General   Post-op Pain Management:    Induction: Intravenous  Airway Management Planned: Oral ETT  Additional Equipment:   Intra-op Plan:   Post-operative Plan: Extubation in OR  Informed Consent: I have reviewed the patients History and Physical, chart, labs and discussed the procedure including the risks, benefits and alternatives for the proposed anesthesia with the patient or authorized representative who has indicated his/her understanding and acceptance.   Dental advisory given  Plan Discussed with: CRNA and Surgeon  Anesthesia Plan Comments: (Esophageal perforation, most likely secondary to intubation attempt 08/08/12 S/P I and D neck and mediatinum S/P ORIF L. Wrist and L. Hip 10/9 CAD S/P CABG last myoview 08/19/09 no ischemia EF 55% by Echo 6/13 Pacer htn Physical deconditioning  Plan GA with glide scope and 6.5 ETT, extra care to avoid further esophageal injury  Roberts Gaudy, MD)       Anesthesia Quick Evaluation

## 2012-08-16 NOTE — Progress Notes (Signed)
08/16/2012 10:59 AM  Amy [name], [name]  Post-Op Day 6    Temp:  [98 F (36.7 C)-98.7 F (37.1 C)] 98.7 F (37.1 C) (10/17 0753) Pulse Rate:  [74-91] 79  (10/17 0753) Resp:  [16-24] 24  (10/17 0753) BP: (129-161)/(52-67) 153/65 mmHg (10/17 0753) SpO2:  [93 %-99 %] 97 % (10/17 0753) Weight:  [74.662 kg (164 lb 9.6 oz)] 74.662 kg (164 lb 9.6 oz) (10/17 0040),     Intake/Output Summary (Last 24 hours) at 08/16/12 1059 Last data filed at 08/16/12 1052  Gross per 24 hour  Intake 2062.5 ml  Output   1900 ml  Net  162.5 ml    Results for orders placed during the hospital encounter of 08/07/12 (from the past 24 hour(s))  GLUCOSE, CAPILLARY     Status: Abnormal   Collection Time   08/15/12 11:34 AM      Component Value Range   Glucose-Capillary 161 (*) 70 - 99 mg/dL   Comment 1 Notify RN    GLUCOSE, CAPILLARY     Status: Abnormal   Collection Time   08/15/12  3:42 PM      Component Value Range   Glucose-Capillary 134 (*) 70 - 99 mg/dL  GLUCOSE, CAPILLARY     Status: Abnormal   Collection Time   08/15/12  7:55 PM      Component Value Range   Glucose-Capillary 137 (*) 70 - 99 mg/dL  GLUCOSE, CAPILLARY     Status: Abnormal   Collection Time   08/16/12  1:47 AM      Component Value Range   Glucose-Capillary 167 (*) 70 - 99 mg/dL  COMPREHENSIVE METABOLIC PANEL     Status: Abnormal   Collection Time   08/16/12  5:00 AM      Component Value Range   Sodium 142  135 - 145 mEq/L   Potassium 3.5  3.5 - 5.1 mEq/L   Chloride 98  96 - 112 mEq/L   CO2 41 (*) 19 - 32 mEq/L   Glucose, Bld 152 (*) 70 - 99 mg/dL   BUN 37 (*) 6 - 23 mg/dL   Creatinine, Ser 0.88  0.50 - 1.10 mg/dL   Calcium 9.6  8.4 - 10.5 mg/dL   Total Protein 5.7 (*) 6.0 - 8.3 g/dL   Albumin 2.1 (*) 3.5 - 5.2 g/dL   AST 36  0 - 37 U/L   ALT 28  0 - 35 U/L   Alkaline Phosphatase 256 (*) 39 - 117 U/L   Total Bilirubin 1.0  0.3 - 1.2 mg/dL   GFR calc non Af Amer 57 (*) >90 mL/min   GFR calc Af Amer 66 (*)  >90 mL/min  MAGNESIUM     Status: Normal   Collection Time   08/16/12  5:00 AM      Component Value Range   Magnesium 1.9  1.5 - 2.5 mg/dL  PHOSPHORUS     Status: Normal   Collection Time   08/16/12  5:00 AM      Component Value Range   Phosphorus 3.1  2.3 - 4.6 mg/dL  CBC     Status: Abnormal   Collection Time   08/16/12  5:00 AM      Component Value Range   WBC 8.2  4.0 - 10.5 K/uL   RBC 3.89  3.87 - 5.11 MIL/uL   Hemoglobin 11.4 (*) 12.0 - 15.0 g/dL   HCT 35.5 (*) 36.0 - 46.0 %   MCV 91.3  78.0 - 100.0 fL   MCH 29.3  26.0 - 34.0 pg   MCHC 32.1  30.0 - 36.0 g/dL   RDW 14.5  11.5 - 15.5 %   Platelets 216  150 - 400 K/uL  GLUCOSE, CAPILLARY     Status: Abnormal   Collection Time   08/16/12  5:47 AM      Component Value Range   Glucose-Capillary 159 (*) 70 - 99 mg/dL  GLUCOSE, CAPILLARY     Status: Abnormal   Collection Time   08/16/12  7:55 AM      Component Value Range   Glucose-Capillary 148 (*) 70 - 99 mg/dL   Comment 1 Notify RN      SUBJECTIVE:  Pt in OR for G tube placement.  Nurses report limited neck drainage.  OBJECTIVE:  Not examined.  IMPRESSION:  T Prot, Alb down.  Needs aggressive nutrition.  PLAN:  Will follow.  Continue drsg. Changes.  Will advance Penrose drainw when wound starts to show signs of healing.  [name]

## 2012-08-16 NOTE — Progress Notes (Signed)
ANTIBIOTIC CONSULT NOTE - FOLLOW UP  Pharmacy Consult for Zosyn Indication: Empiric coverage in setting of esophageal perforation + mediastinitis  Allergies  Allergen Reactions  . Hydrocodone-Acetaminophen Itching    Tolerates tylenol    Patient Measurements: Height: 5\' 5"  (165.1 cm) Weight: 164 lb 9.6 oz (74.662 kg) IBW/kg (Calculated) : 57   Vital Signs: Temp: 98 F (36.7 C) (10/17 1249) Temp src: Axillary (10/17 1249) BP: 193/74 mmHg (10/17 1300) Pulse Rate: 86  (10/17 1249) Intake/Output from previous day: 10/16 0701 - 10/17 0700 In: 1512.5 [I.V.:220; IV Piggyback:112.5; TPN:1180] Out: 2100 [Urine:2100] Intake/Output from this shift: Total I/O In: 645 [I.V.:420; TPN:225] Out: -   Labs:  Basename 08/16/12 0500 08/15/12 0505 08/14/12 0550 08/14/12 0444  WBC 8.2 -- -- 7.2  HGB 11.4* -- -- 9.1*  PLT 216 -- -- 171  LABCREA -- -- -- --  CREATININE 0.88 0.90 0.83 --   Estimated Creatinine Clearance: 44.7 ml/min (by C-G formula based on Cr of 0.88). No results found for this basename: VANCOTROUGH:2,VANCOPEAK:2,VANCORANDOM:2,GENTTROUGH:2,GENTPEAK:2,GENTRANDOM:2,TOBRATROUGH:2,TOBRAPEAK:2,TOBRARND:2,AMIKACINPEAK:2,AMIKACINTROU:2,AMIKACIN:2, in the last 72 hours   Microbiology: Recent Results (from the past 720 hour(s))  URINE CULTURE     Status: Normal   Collection Time   08/07/12 11:24 AM      Component Value Range Status Comment   Specimen Description URINE, CATHETERIZED   Final    Special Requests NONE   Final    Culture  Setup Time 08/07/2012 16:50   Final    Colony Count NO GROWTH   Final    Culture NO GROWTH   Final    Report Status 08/08/2012 FINAL   Final   SURGICAL PCR SCREEN     Status: Normal   Collection Time   08/08/12  5:32 AM      Component Value Range Status Comment   MRSA, PCR NEGATIVE  NEGATIVE Final    Staphylococcus aureus NEGATIVE  NEGATIVE Final   WOUND CULTURE     Status: Normal   Collection Time   08/10/12 10:29 PM      Component Value  Range Status Comment   Specimen Description WOUND ESOPHAGUS   Final    Special Requests POF ZOSYN   Final    Gram Stain     Final    Value: FEW WBC PRESENT, PREDOMINANTLY PMN     RARE SQUAMOUS EPITHELIAL CELLS PRESENT     ABUNDANT GRAM POSITIVE RODS     FEW GRAM NEGATIVE RODS   Culture     Final    Value: MULTIPLE ORGANISMS PRESENT, NONE PREDOMINANT NO STAPHYLOCOCCUS AUREUS ISOLATED NO GROUP A STREP (S.PYOGENES) ISOLATED   Report Status 08/14/2012 FINAL   Final   ANAEROBIC CULTURE     Status: Normal   Collection Time   08/10/12 10:29 PM      Component Value Range Status Comment   Specimen Description WOUND ESOPHAGUS   Final    Special Requests PATIENT ON FOLLOWING ZOSYN   Final    Gram Stain     Final    Value: FEW WBC PRESENT, PREDOMINANTLY PMN     RARE SQUAMOUS EPITHELIAL CELLS PRESENT     ABUNDANT GRAM POSITIVE RODS     FEW GRAM NEGATIVE RODS   Culture NO ANAEROBES ISOLATED   Final    Report Status 08/15/2012 FINAL   Final     Anti-infectives     Start     Dose/Rate Route Frequency Ordered Stop   08/10/12 1830   piperacillin-tazobactam (ZOSYN) IVPB 3.375 g  3.375 g 12.5 mL/hr over 240 Minutes Intravenous 3 times per day 08/10/12 1730            Assessment: 89yof found to have esophageal perforation on upper endoscopy this admission along with mediastinitis, now s/p I&D. Cultures have reported no significant growth. Patient remains afebrile, WBC WNL, renal function has been stable. The patient continues on empiric Zosyn Day 6 - dose remains appropriate.   Plan:  1. Continue Zosyn 3.375g IV every 8 hours 2. Will continue to follow renal function, LOT, and antibiotic de-escalation plans   Earleen Newport, PharmD [id number] 08/16/2012 1:33 PM

## 2012-08-16 NOTE — Progress Notes (Signed)
Nutrition Follow-up  Intervention:   1. Once ready to initiate enteral nutrition, recommend initiation of Jevity 1.2 formula via PEG at 15 ml/hr. Advance by 10 ml/hr every 8 hours, or as tolerated (and in conjunction with TPN weaning), to goal of 55 ml/hr. Goal TF regimen will provide: 1584 kcal, 74 grams protein, 1065 ml free water.  2. Once pt without IVF, recommend free water flushes of 200 ml QID. Total free water flushes and TF free water will provide: 1865 ml water daily. 3. RD to continue to follow nutrition care plan  Assessment:   Surgery consulted yesterday for gastrostomy tube. Per surgeon, plan for open g-tube today. Discussed patient's nutrition plan of care with Erin Hearing, NP. Will leave recommendations for enteral nutrition in preparation for providing enteral nutrition when okayed per surgery.  Patient is receiving TPN with Clinimix E 5/20 @ 65 ml/hr.  Lipids (20% IVFE @ 10 ml/hr), multivitamins, and trace elements are provided 3 times weekly (MWF) due to national backorder.  Provides 1578 kcal and 78 grams protein daily (based on weekly average).  Meets 100% minimum estimated kcal and 100% minimum estimated protein needs.  Diet Order:  NPO  Meds: Scheduled Meds:    . bacitracin  1 application Topical BID  . chlorhexidine  5 mL Mouth/Throat QID  . furosemide      . furosemide  40 mg Intravenous Daily  . insulin aspart  0-9 Units Subcutaneous Q4H  . magnesium sulfate 1 - 4 g bolus IVPB  2 g Intravenous Once  . magnesium sulfate 1 - 4 g bolus IVPB  2 g Intravenous Once  . metoprolol  5 mg Intravenous Q4H  . pantoprazole (PROTONIX) IV  40 mg Intravenous Q12H  . piperacillin-tazobactam (ZOSYN)  IV  3.375 g Intravenous Q8H  . potassium chloride  10 mEq Intravenous Q1 Hr x 3  . potassium chloride  10 mEq Intravenous Q1 Hr x 3  . sodium chloride  10-40 mL Intracatheter Q12H  . sodium chloride  3 mL Intravenous Q12H   Continuous Infusions:    . sodium chloride 35 mL/hr  at 08/13/12 0740  . fat emulsion Stopped (08/16/12 0850)  . TPN (CLINIMIX) +/- additives 65 mL/hr at 08/14/12 1715  . TPN (CLINIMIX) +/- additives Stopped (08/16/12 0850)  . TPN (CLINIMIX) +/- additives     PRN Meds:.bacitracin, hydrALAZINE, LORazepam, morphine injection, ondansetron (ZOFRAN) IV, ondansetron, sodium chloride  Labs:  CMP     Component Value Date/Time   NA 142 08/16/2012 0500   K 3.5 08/16/2012 0500   CL 98 08/16/2012 0500   CO2 41* 08/16/2012 0500   GLUCOSE 152* 08/16/2012 0500   BUN 37* 08/16/2012 0500   CREATININE 0.88 08/16/2012 0500   CALCIUM 9.6 08/16/2012 0500   PROT 5.7* 08/16/2012 0500   ALBUMIN 2.1* 08/16/2012 0500   AST 36 08/16/2012 0500   ALT 28 08/16/2012 0500   ALKPHOS 256* 08/16/2012 0500   BILITOT 1.0 08/16/2012 0500   GFRNONAA 57* 08/16/2012 0500   GFRAA 66* 08/16/2012 0500    Phosphorus  Date Value Range Status  08/16/2012 3.1  2.3 - 4.6 mg/dL Final    Magnesium  Date Value Range Status  08/16/2012 1.9  1.5 - 2.5 mg/dL Final     Intake/Output Summary (Last 24 hours) at 08/16/12 0959 Last data filed at 08/16/12 0900  Gross per 24 hour  Intake 1662.5 ml  Output   1900 ml  Net -237.5 ml  BM 10/11  CBG (last  3)   Basename 08/16/12 0755 08/16/12 0547 08/16/12 0147  GLUCAP 148* 159* 167*    Weight Status:  74.6 kg (10/17) -- trending down  Body mass index is 27.39 kg/(m^2). Overweight.  Estimated needs:  1550-1850 kcals, 77-90 gm protein  Nutrition Dx:  Inadequate Oral Intake r/t inability to eat as evidenced by NPO status, ongoing  Goal:  TPN to meet >90% of estimated protein needs, maximize energy provision as able during national lipid backorder, met  Monitor:  TPN prescription, ability to transition to EN, weight, labs, I/O's  Inda Coke MS, RD, LDN Pager: [phone] After-hours pager: [phone]

## 2012-08-16 NOTE — Transfer of Care (Signed)
Immediate Anesthesia Transfer of Care Note  Patient: Amy [name]  Procedure(s) Performed: Procedure(s) (LRB) with comments: GASTROSTOMY (N/A)  Patient Location: PACU  Anesthesia Type: General  Level of Consciousness: awake and sedated  Airway & Oxygen Therapy: Patient Spontanous Breathing and Patient connected to face mask oxygen  Post-op Assessment: Report given to PACU RN, Post -op Vital signs reviewed and stable and Patient moving all extremities X 4  Post vital signs: Reviewed and stable  Complications: No apparent anesthesia complications

## 2012-08-16 NOTE — Progress Notes (Signed)
Occupational Therapy Note   08/16/12 1555  OT Visit Information  Last OT Received On 08/16/12  Assistance Needed +2  OT Time Calculation  OT Start Time 1533  OT Stop Time 1545  OT Time Calculation (min) 12 min  Precautions  Precautions Fall;Posterior Hip;Other (comment)  Precaution Booklet Issued Yes (comment)  Precaution Comments OK to WB through [place] with splint in place.  Okay to start AROM wrist and Digits Lt.   Required Braces or Orthoses Other Brace/Splint  Restrictions  Weight Bearing Restrictions Yes  LUE Weight Bearing WBAT  LLE Weight Bearing WBAT  ADL  ADL Comments Splint checked.  No s/s of redness, or pain.  RN instructed on splint wear and care and sign posted  Cognition  Arousal/Alertness Lethargic  Orientation Level Appears intact for tasks assessed  Behavior During Session Lethargic  Cognition - Other Comments Pt. very lethargic during splinting  OT - End of Session  Activity Tolerance Patient limited by fatigue  Patient left in bed;with call bell/phone within reach;with family/visitor present  Nurse Communication Other (comment) (splint wear and care)  OT Assessment/Plan  Comments on Treatment Session Splint fitting fine with no signs/symptoms of issues.  OT Plan Discharge plan remains appropriate  OT Frequency Min 2X/week  Follow Up Recommendations Skilled nursing facility  Equipment Recommended None recommended by PT  Acute Rehab OT Goals  OT Goal Formulation With patient/family  Time For Goal Achievement 08/23/12  Potential to Achieve Goals Good  ADL Goals  ADL Goal: Additional Goal #1 - Progress Progressing toward goals   Lucille Passy, OTR/L [phone]

## 2012-08-16 NOTE — Anesthesia Postprocedure Evaluation (Signed)
  Anesthesia Post-op Note  Patient: [place] M Brownlee  Procedure(s) Performed: Procedure(s) (LRB) with comments: GASTROSTOMY (N/A)  Patient Location: PACU  Anesthesia Type: General  Level of Consciousness: awake, alert  and oriented  Airway and Oxygen Therapy: Patient Spontanous Breathing and Patient connected to nasal cannula oxygen  Post-op Pain: none  Post-op Assessment: Post-op Vital signs reviewed, Patient's Cardiovascular Status Stable, Respiratory Function Stable, Patent Airway, No signs of Nausea or vomiting and Pain level controlled  Post-op Vital Signs: Reviewed and stable  Complications: No apparent anesthesia complications

## 2012-08-16 NOTE — Progress Notes (Signed)
Day of Surgery  Subjective: For open g tube today  Objective: Vital signs in last 24 hours: Temp:  [98 F (36.7 C)-98.7 F (37.1 C)] 98.7 F (37.1 C) (10/17 0753) Pulse Rate:  [74-94] 79  (10/17 0753) Resp:  [16-24] 24  (10/17 0753) BP: (129-161)/(52-67) 153/65 mmHg (10/17 0753) SpO2:  [93 %-99 %] 97 % (10/17 0753) Weight:  [74.662 kg (164 lb 9.6 oz)] 74.662 kg (164 lb 9.6 oz) (10/17 0040) Last BM Date: 08/10/12  Intake/Output from previous day: 10/16 0701 - 10/17 0700 In: 1512.5 [I.V.:220; IV Piggyback:112.5; TPN:1180] Out: 2100 [Urine:2100] Intake/Output this shift:    abd soft, no change from yesterday  Lab Results:   Basename 08/16/12 0500 08/14/12 0444  WBC 8.2 7.2  HGB 11.4* 9.1*  HCT 35.5* 35.1*  PLT 216 171   BMET  Basename 08/16/12 0500 08/15/12 0505  NA 142 144  K 3.5 3.5  CL 98 103  CO2 41* 38*  GLUCOSE 152* 159*  BUN 37* 38*  CREATININE 0.88 0.90  CALCIUM 9.6 9.5   PT/INR No results found for this basename: LABPROT:2,INR:2 in the last 72 hours ABG No results found for this basename: PHART:2,PCO2:2,PO2:2,HCO3:2 in the last 72 hours  Studies/Results: No results found.  Anti-infectives: Anti-infectives     Start     Dose/Rate Route Frequency Ordered Stop   08/10/12 1830   piperacillin-tazobactam (ZOSYN) IVPB 3.375 g        3.375 g 12.5 mL/hr over 240 Minutes Intravenous 3 times per day 08/10/12 1730            Assessment/Plan: s/p Procedure(s) (LRB) with comments: GASTROSTOMY (N/A)  For open g tube today.  Please see our note from 10/16 as well.  LOS: 9 days    Amy [name] 08/16/2012

## 2012-08-16 NOTE — Progress Notes (Signed)
Occupational Therapy Treatment Patient Details Name: [place] M Utley MRN: [id number] DOB: 29-Jun-1922 Today's Date: 08/16/2012 Time: [id number] OT Time Calculation (min): 67 min  OT Assessment / Plan / Recommendation Comments on Treatment Session Pt. tolerated splint fabrication.  Pt. too lethargic to participate in AROM wrist and digits this date.  Will re-attempt tomorrow.  Will monitor splint for signs/symptoms skin breakdown    Follow Up Recommendations  Skilled nursing facility    Barriers to Discharge       Equipment Recommendations  None recommended by PT    Recommendations for Other Services    Frequency Min 2X/week   Plan Discharge plan remains appropriate    Precautions / Restrictions Precautions Precautions: Fall;Posterior Hip;Other (comment) (Ok to [employer] through Lt. wrist with splint on) Precaution Comments: OK to WB through [place] with splint in place.  Okay to start AROM wrist and Digits Lt.  Required Braces or Orthoses: Other Brace/Splint (wrist splint Lt) Restrictions Weight Bearing Restrictions: Yes LUE Weight Bearing: Weight bearing as tolerated (with splint in place) LLE Weight Bearing: Weight bearing as tolerated   Pertinent Vitals/Pain     ADL  ADL Comments: Order received via Dr. Burney Gauze for fabrication of orthoplast wrist splint Lt. UE and okay to start AROM Lt. wrist and digits.  Splint fabricated and fitted to Lt. UE.  Pt. family, and RN informed re: purpose of splint and to monitor for sign/symptoms of skin breakdown.      OT Diagnosis:    OT Problem List:   OT Treatment Interventions:     OT Goals Acute Rehab OT Goals OT Goal Formulation: With patient/family Time For Goal Achievement: 08/23/12 Potential to Achieve Goals: Good ADL Goals Additional ADL Goal #1: Pt/family will be independent with splint wear and care ADL Goal: Additional Goal #1 - Progress: Goal set today Arm Goals Pt Will Perform AROM: 10 reps;Right upper extremity;with  supervision, verbal cues required/provided (wrist and digits) Arm Goal: AROM - Progress: Goal set today  Visit Information  Last OT Received On: 08/16/12 Assistance Needed: +2    Subjective Data      Prior Functioning       Cognition  Arousal/Alertness: Lethargic Behavior During Session: Lethargic Cognition - Other Comments: Pt. very lethargic during splinting    Mobility  Shoulder Instructions         Exercises      Balance     End of Session OT - End of Session Activity Tolerance: Patient limited by fatigue Patient left: in bed;with call bell/phone within reach;with family/visitor present Nurse Communication: Other (comment) (splint wear and care)  GO     Radley Barto M 08/16/2012, 3:24 PM

## 2012-08-16 NOTE — Progress Notes (Signed)
CRITICAL VALUE ALERT  Critical value received:  CO2-41  Date of notification:  08/16/12  Time of notification:  0700  Critical value read back: yes  Nurse who received alert:  M.Piel  MD notified (1st page):  A. Lissa Merlin  Time of first page:  0700  MD notified (2nd page):  Time of second page:  Responding MD:    Time MD responded:    MD notified.

## 2012-08-16 NOTE — Progress Notes (Signed)
[facility] Progress Note [place] TEAM 1 - Stepdown/ICU TEAM   Niger M Poppell CT:[id number] DOB: 05-23-1922 DOA: 08/07/2012 PCP: Purvis Kilts, MD  Brief narrative: 78 year old female who lives alone. She experienced a mechanical fall going up the steps at home. This was not associated with loss of consciousness, prefall chest pain palpitations or lightheadedness. Despite this the daughter reports patient had been having suboptimal blood pressures with systolics in the [id number] - the patient had been complaining of some nonspecific dizziness. The patient was transported to the hospital via EMS and she was found to be in atrial fibrillation with RVR with a ventricular response of 120. She subsequently converted back to sinus rhythm. She was initially treated at [facility]. After arrival she was found to have an impacted fracture of left hip as well as a left radial head fracture. Because of her underlying multiple medical problems including coronary artery disease and chronic kidney disease she was subsequently transported to [facility] for further monitoring and treatment. She eventually underwent surgical treatment for her fractures. Because of complaints of odynophagia a swallowing evaluation was obtained which showed no evidence of oral pharyngeal dysphagia - she continued with odynophagia so a gastroenterology consult was obtained. She subsequently underwent EGD which revealed a mucosal defect in the cervical esophagus that was either a ulceration versus a perforation. She eventually underwent an esophagram with Gastrografin that confirmed a contained perforation. Cardiothoracic surgery was consulted. They felt that the etiology to the small perforation was related to a traumatic injury that was related to intubation at time of her initial orthopedic surgery on October 9. An ENT consult was obtained as well as a CT scan of the neck and chest to rule out any significant fluid collections.  The CT did reveal extensive retropharyngeal gas dissecting upwards from the mediastinum with an associated focal perforation of the esophagus at the level of the cricoid cartilage with oral contrast extravasating into the retro-pharyngeal spacing communicating with the superior mediastinum. Subsequently the patient underwent incision and drainage of this area by ENT surgeon. Currently she is stable on parenteral nutrition in the step down unit. Team 1 assumed care of this patient on 08/14/2012.  Impression and Plan:  Fracture of left hip/radial head after fall at home: A) Status post hip hemiarthroplasty left hip by Dr Ronnie Derby 08/08/2012 B) s/p ORIF by Dr Burney Gauze 08/08/2012 *Management per orthopedic team *Continue OT and PT as tolerated  Odynophagia, new traumatic peri op esophageal perforation seen on endoscopy: A) Mediastinitis due to esophageal perforation - s/p Incision and drainage, right neck and mediastinum, transcervical approach. Cervical esophagoscopy *Appreciate Cardiothoracic and ENT assistance *Status post placement of a gastrostomy tube on 08/16/2012 by general surgery *ENT adding nasal hygiene measures and Peridex oral swish and spit *Continue empiric IV Zosyn *Will eventually need repeat Gastrografin study in next 7-10 days *CVTS notes patient with significant volume JP drain and therefore perforation likely not closed and this will likely take a long period of time to occur  Acute respiratory failure with hypoxia due to Pulmonary edema, noncardiac *Continue Lasix 40 mg IV daily-remains in positive balance *Complete followup portable chest x-ray in the morning *Continue supportive care with oxygen and pulmonary toileting since most recent chest x-ray from 08/12/2012 revealed not only evidence of volume overload but bilateral basilar atelectasis  Acute blood loss anemia with macrocytosis *Status post transfusion of packed red blood cells this admission *Hemoglobin nadir 6.1 on  08/12/2012 *Current hemoglobin up to 9.1 *Likely etiology  due to multiple recent surgical procedures *B12  Sub optimal at 299 and since less than 300 consider IM B12 repletion. Folate level in indeterminate range so will also likely need repletion- will d/w attending MD.  Hypertension *Continue IV Lopressor every 4 hours with when necessary hydralazine *Has postoperative increases in blood pressure likely related to pain *Once okay to use G-tube begin "oral" antihypertensives  Protein calorie malnutrition/hyperglycemia status post open gastrostomy tube 08/16/2012 *Pharmacy managing parenteral nutrition and lipids as well as electrolytes *Nutrition has calculated type of tube feeding and nutritional needs and these will begin what surgery clears patient for feedings *Continue supplemental sliding scale insulin  CAD, CABG X 3 10/09. Myoview low risk 10/10. EF >55% 2D 6/13. *Received preoperative cardiac clearance and has remained asymptomatic since admission  Chronic renal insufficiency, stage III (moderate), SCr 1.4 Sept 2013 *Renal function remains stable at her usual baseline  PAF/SSS history of Pacemaker March 2010 (MDT) *Remains stable with sinus rhythm and sinus tachycardia *Beta blockers resumed 08/14/2012  Hyperlipidemia, statin intol. *N.p.o.  Aortic valve sclerosis, no stenosis  Carotid bruit present bilat, dopplers OK 6/13 *Neurologically stable postop  History of UTI - recent completion 10 days Cipro *Urinalysis at presentation negative for infectious process although did have mucous present with hyaline casts concerning for possible early ATN  Hx of dizziness *Initial fall reported as mechanical so no indication to pursue syncope work - dizziness could be rate issue in setting of afib  DVT after previous hip surgery *Continue SCDs  DVT prophylaxis: SCDs Code Status: DO NOT RESUSCITATE Family Communication: Spoke with patient's daughter  Disposition Plan: Remain  in stepdown  Consultants: Orthopedic Cardiothoracic Gastroenterology ENT Gen Surgery  Procedures: ORIF distal left radial fracture with carpal tunnel release by Dr. Burney Gauze and left arthroplasty bipolar hip on 08/08/2012 by Dr. Ronnie Derby  EGD 08/10/2012 by Dr. Benson Norway  Incision and drainage of right neck and mediastinum with a transcervical approach as well as cervical esophagoscopy on [id number] by Dr. Erik Obey  Status post open gastrostomy tube placement with #24 Malecot tube by Dr. Grandville Silos 08/16/2012  Antibiotics: Zosyn 10/11 >>>  HPI/Subjective: Seen briefly prior to trip to operating room earlier this morning. At that time patient was quite alert and in good spirits. Subsequently has been seen postoperatively and remained sedated but is able to tell me she is having expected operative site pain. Daughter is at the bedside and has been updated on status.   Objective: Blood pressure 193/74, pulse 86, temperature 98 F (36.7 C), temperature source Axillary, resp. rate 18, height 5\' 5"  (1.651 m), weight 74.662 kg (164 lb 9.6 oz), SpO2 98.00%.  Intake/Output Summary (Last 24 hours) at 08/16/12 1353 Last data filed at 08/16/12 1250  Gross per 24 hour  Intake 2157.5 ml  Output   1000 ml  Net 1157.5 ml     Exam: General: Minimal respiratory distress ENT: Neck incisions covered by dressing which is clean dry and intact Lungs: Fine bibasilar crackles and diminished in the bases, nasal cannula oxygen Cardiovascular: Regular rate and rhythm without murmur gallop or rub normal S1 and S2, trace peripheral edema Abdomen: Nontender, nondistended, soft, bowel sounds positive, no rebound, no ascites, no appreciable mass, has new abdominal dressing which is clean dry and intact-gastrostomy tube currently is  capped an orders pending to place to straight drain Musculoskeletal: No significant cyanosis, clubbing of bilateral lower extremities; cast to left forearm and wrist has been replaced  with Ace wrap Neurological: Patient is arousable but  sedated postoperatively, she spontaneously moves extremities x4 but formal strength exam was not completed, there is no appearance of any focal neurological deficits appreciated  Data Reviewed: Basic Metabolic Panel:  Lab [id number] 0500 08/15/12 0505 08/14/12 0550 08/13/12 0400 08/12/12 0450  NA 142 144 143 142 142  K 3.5 3.5 3.7 3.9 3.6  CL 98 103 105 105 108  CO2 41* 38* 32 30 29  GLUCOSE 152* 159* 155* 191* 156*  BUN 37* 38* 33* 32* 28*  CREATININE 0.88 0.90 0.83 0.96 0.98  CALCIUM 9.6 9.5 9.6 9.0 9.2  MG 1.9 1.7 1.9 1.9 2.1  PHOS 3.1 3.0 2.2* 2.7 1.9*   Liver Function Tests:  Lab 08/16/12 0500 08/13/12 0400 08/12/12 0450 08/11/12 0528  AST 36 33 17 27  ALT 28 14 7 9   ALKPHOS 256* 221* 92 121*  BILITOT 1.0 1.0 0.5 0.6  PROT 5.7* 5.1* 5.1* 5.7*  ALBUMIN 2.1* 1.8* 1.8* 2.2*   CBC:  Lab 08/16/12 0500 08/14/12 0444 08/13/12 0400 08/12/12 0730 08/12/12 0450  WBC 8.2 7.2 8.2 8.9 8.0  NEUTROABS -- -- 6.8 -- 6.7  HGB 11.4* 9.1* 9.6* 6.1* 6.9*  HCT 35.5* 35.1* 28.9* 18.8* 21.1*  MCV 91.3 117.4* 88.1 91.7 91.3  PLT 216 171 157 179 145*   CBG:  Lab 08/16/12 1304 08/16/12 0755 08/16/12 0547 08/16/12 0147 08/15/12 1955  GLUCAP 188* 148* 159* 167* 137*    Recent Results (from the past 240 hour(s))  URINE CULTURE     Status: Normal   Collection Time   08/07/12 11:24 AM      Component Value Range Status Comment   Specimen Description URINE, CATHETERIZED   Final    Special Requests NONE   Final    Culture  Setup Time 08/07/2012 16:50   Final    Colony Count NO GROWTH   Final    Culture NO GROWTH   Final    Report Status 08/08/2012 FINAL   Final   SURGICAL PCR SCREEN     Status: Normal   Collection Time   08/08/12  5:32 AM      Component Value Range Status Comment   MRSA, PCR NEGATIVE  NEGATIVE Final    Staphylococcus aureus NEGATIVE  NEGATIVE Final   WOUND CULTURE     Status: Normal   Collection Time   08/10/12 10:29  PM      Component Value Range Status Comment   Specimen Description WOUND ESOPHAGUS   Final    Special Requests POF ZOSYN   Final    Gram Stain     Final    Value: FEW WBC PRESENT, PREDOMINANTLY PMN     RARE SQUAMOUS EPITHELIAL CELLS PRESENT     ABUNDANT GRAM POSITIVE RODS     FEW GRAM NEGATIVE RODS   Culture     Final    Value: MULTIPLE ORGANISMS PRESENT, NONE PREDOMINANT NO STAPHYLOCOCCUS AUREUS ISOLATED NO GROUP A STREP (S.PYOGENES) ISOLATED   Report Status 08/14/2012 FINAL   Final   ANAEROBIC CULTURE     Status: Normal   Collection Time   08/10/12 10:29 PM      Component Value Range Status Comment   Specimen Description WOUND ESOPHAGUS   Final    Special Requests PATIENT ON FOLLOWING ZOSYN   Final    Gram Stain     Final    Value: FEW WBC PRESENT, PREDOMINANTLY PMN     RARE SQUAMOUS EPITHELIAL CELLS PRESENT     ABUNDANT GRAM  POSITIVE RODS     FEW GRAM NEGATIVE RODS   Culture NO ANAEROBES ISOLATED   Final    Report Status 08/15/2012 FINAL   Final      Studies:  Recent x-ray studies have been reviewed in detail by the Attending Physician  Scheduled Meds:  Reviewed in detail by the Attending Physician   Erin Hearing, [place] [facility] Office  [phone] Pager [phone]  On-Call/Text Page:      [email or web address]      password [email or web address]  If 7PM-7AM, please contact night-coverage [email or web address] Password [email or web address] 08/16/2012, 1:53 PM   LOS: 9 days    I have examined the patient and reviewed the chart. I agree with the above note which I have modified.   Debbe Odea, MD [phone]

## 2012-08-16 NOTE — Op Note (Signed)
08/07/2012 - 08/16/2012  10:51 AM  PATIENT:  Amy [name]  78 y.o. female  PRE-OPERATIVE DIAGNOSIS:  esophageal perforation And need for enteral access  POST-OPERATIVE DIAGNOSIS:  esophageal perforation And need for enteral access  PROCEDURE:  Procedure(s): GASTROSTOMY 24 MALECOTT  SURGEON:  Surgeon(s): Zenovia Jarred, MD  PHYSICIAN ASSISTANT:   ASSISTANTS: none   ANESTHESIA:   general  EBL:  Total I/O In: 150 [TPN:150] Out: -   BLOOD ADMINISTERED:none  DRAINS: Gastrostomy Tube   SPECIMEN:  No Specimen  DISPOSITION OF SPECIMEN:  N/A  COUNTS:  YES  DICTATION: Viviann Spare Dictation Patient suffered an esophageal perforation. This has been treated but she needs gastrostomy tube placement for nutrition. We are proceeding with open gastrostomy placement. Informed consent was obtained. The patient is receiving intravenous antibiotics. She was brought to the operating room and general endotracheal anesthesia was administered by the anesthesia staff. Her abdomen was prepped and draped in a sterile fashion. We did time out procedure. Limited upper midline incision was made. Subcutaneous tissues were dissected down revealing the anterior fascia. This was divided along the midline. Peritoneal cavity was entered under direct vision. The stomach was identified. It was mobile enough for placement of the gastrostomy tube. 2 concentric pursestring sutures Of 2-0 silk were placed in the gastric wall at the appropriate site.Small incision was made in the left upper quadrant and a 24 Malecot tube was passed through the abdominal wall. Small gastrotomy was made in the middle of the concentric pursestring sutures. Malecot tube was inserted without difficulty. First the inner then the outer pursestring was tied securely. Tube was nicely positioned in the stomach. Tube was withdrawn from the skin until the stomach was up to the abdominal wall. The pursestring sutures were each used to tack the stomach up  to the abdominal wall. Multiple interrupted 2-0 silk sutures were then used to further tack the stomach up to the abdominal wall around the tube. Tube flushed well without leakage. The area was irrigated. There was no bleeding. Tube was secured with 2 separate 2-0 silk sutures. Fascia was closed with running #1 PDS. Skin was irrigated and closed with staples. All counts were correct. A cap was placed on the gastrostomy tube. Patient tolerated procedure well without apparent complication was taken recovery in stable condition.  PATIENT DISPOSITION:  PACU - hemodynamically stable.   Delay start of Pharmacological VTE agent (>24hrs) due to surgical blood loss or risk of bleeding:  no  Georganna Skeans, MD, MPH, FACS Pager: [phone]  10/17/201310:51 AM

## 2012-08-16 NOTE — Consult Note (Signed)
Plan open gastrostomy tube placement 10/17. Procedure, risks, benefits D/W patient and family member.  They agree. Patient examined and I agree with the assessment and plan  Georganna Skeans, MD, MPH, FACS Pager: [phone]  08/16/2012 9:27 AM

## 2012-08-16 NOTE — Progress Notes (Signed)
PT Cancellation Note  Patient Details Name: Niger M Kosinski MRN: [id number] DOB: 1922-02-28   Cancelled Treatment:    Reason Eval/Treat Not Completed: Patient at procedure or test/unavailable.  Pt. In OR.  Will return at later date.  Thanks.  INGOLD,Zynasia Burklow 08/16/2012, 11:50 AM  [facility] [phone] (pager)

## 2012-08-17 ENCOUNTER — Inpatient Hospital Stay (HOSPITAL_COMMUNITY): Payer: Medicare Other

## 2012-08-17 ENCOUNTER — Encounter (HOSPITAL_COMMUNITY): Payer: Self-pay | Admitting: General Surgery

## 2012-08-17 DIAGNOSIS — I10 Essential (primary) hypertension: Secondary | ICD-10-CM

## 2012-08-17 LAB — PHOSPHORUS: Phosphorus: 3.4 mg/dL (ref 2.3–4.6)

## 2012-08-17 LAB — MAGNESIUM: Magnesium: 1.8 mg/dL (ref 1.5–2.5)

## 2012-08-17 LAB — CBC
HCT: 29.5 % — ABNORMAL LOW (ref 36.0–46.0)
MCHC: 32.2 g/dL (ref 30.0–36.0)
MCV: 91.9 fL (ref 78.0–100.0)
RDW: 14.5 % (ref 11.5–15.5)

## 2012-08-17 LAB — BASIC METABOLIC PANEL
BUN: 37 mg/dL — ABNORMAL HIGH (ref 6–23)
Calcium: 9.2 mg/dL (ref 8.4–10.5)

## 2012-08-17 LAB — GLUCOSE, CAPILLARY
Glucose-Capillary: 170 mg/dL — ABNORMAL HIGH (ref 70–99)
Glucose-Capillary: 154 mg/dL — ABNORMAL HIGH (ref 70–99)
Glucose-Capillary: 148 mg/dL — ABNORMAL HIGH (ref 70–99)

## 2012-08-17 MED ORDER — HYDRALAZINE HCL 20 MG/ML IJ SOLN
10.0000 mg | Freq: Three times a day (TID) | INTRAMUSCULAR | Status: DC
  Administered 2012-08-17 – 2012-08-23 (×17): 10 mg via INTRAVENOUS
  Filled 2012-08-17 (×20): qty 0.5

## 2012-08-17 MED ORDER — JEVITY 1.2 CAL PO LIQD
1000.0000 mL | ORAL | Status: DC
  Filled 2012-08-17: qty 1000

## 2012-08-17 MED ORDER — CYANOCOBALAMIN 1000 MCG/ML IJ SOLN
1000.0000 ug | Freq: Every day | INTRAMUSCULAR | Status: AC
  Administered 2012-08-17 – 2012-08-21 (×5): 1000 ug via SUBCUTANEOUS
  Filled 2012-08-17 (×5): qty 1

## 2012-08-17 MED ORDER — MAGNESIUM SULFATE 40 MG/ML IJ SOLN
2.0000 g | Freq: Once | INTRAMUSCULAR | Status: AC
  Administered 2012-08-17: 2 g via INTRAVENOUS
  Filled 2012-08-17: qty 50

## 2012-08-17 MED ORDER — LORAZEPAM 2 MG/ML IJ SOLN
0.5000 mg | Freq: Three times a day (TID) | INTRAMUSCULAR | Status: DC | PRN
  Administered 2012-08-18: 0.5 mg via INTRAVENOUS
  Filled 2012-08-17: qty 1

## 2012-08-17 MED ORDER — ZINC TRACE METAL 1 MG/ML IV SOLN
INTRAVENOUS | Status: AC
  Administered 2012-08-17: 18:00:00 via INTRAVENOUS
  Filled 2012-08-17: qty 2000

## 2012-08-17 MED ORDER — JEVITY 1.2 CAL PO LIQD
1000.0000 mL | ORAL | Status: DC
  Administered 2012-08-17 – 2012-08-19 (×2)
  Filled 2012-08-17 (×6): qty 1000

## 2012-08-17 MED ORDER — FAT EMULSION 20 % IV EMUL
240.0000 mL | INTRAVENOUS | Status: AC
  Administered 2012-08-17: 240 mL via INTRAVENOUS
  Filled 2012-08-17: qty 250

## 2012-08-17 NOTE — Progress Notes (Signed)
1 Day Post-Op  Subjective: sore  Objective: Vital signs in last 24 hours: Temp:  [97.2 F (36.2 C)-98.7 F (37.1 C)] 98.2 F (36.8 C) (10/18 0500) Pulse Rate:  [79-98] 86  (10/18 0500) Resp:  [17-27] 19  (10/18 0500) BP: (134-193)/(51-83) 134/70 mmHg (10/18 0500) SpO2:  [89 %-100 %] 96 % (10/18 0500) Last BM Date: 08/10/12  Intake/Output from previous day: 10/17 0701 - 10/18 0700 In: 2145 [I.V.:540; IV Piggyback:75; TPN:1530] Out: 1100 [Urine:1100] Intake/Output this shift:    GI: upper midline dressing CDI, G tube drainage green, soft, ND  Lab Results:   Basename 08/17/12 0450 08/16/12 0500  WBC 10.2 8.2  HGB 9.5* 11.4*  HCT 29.5* 35.5*  PLT 234 216   BMET  Basename 08/17/12 0450 08/16/12 0500  NA 142 142  K 3.9 3.5  CL 100 98  CO2 38* 41*  GLUCOSE 158* 152*  BUN 37* 37*  CREATININE 0.87 0.88  CALCIUM 9.2 9.6   PT/INR No results found for this basename: LABPROT:2,INR:2 in the last 72 hours ABG No results found for this basename: PHART:2,PCO2:2,PO2:2,HCO3:2 in the last 72 hours  Studies/Results: No results found.  Anti-infectives: Anti-infectives     Start     Dose/Rate Route Frequency Ordered Stop   08/10/12 1830   piperacillin-tazobactam (ZOSYN) IVPB 3.375 g        3.375 g 12.5 mL/hr over 240 Minutes Intravenous 3 times per day 08/10/12 1730            Assessment/Plan: s/p Procedure(s) (LRB) with comments: GASTROSTOMY (N/A) S/P G tube May use g tube for meds and TF today Anemia - platelets up so doubt ongoing hemorrhage, check f/u  LOS: 10 days    Amy [name] E 08/17/2012

## 2012-08-17 NOTE — Progress Notes (Signed)
Clinical Social Work  CSW reviewed chart which stated patient had PEG tube placed. CSW called SNF and updated facility on dc plans. CSW agreeable to continue to update facility on patient's progress.  [place], [place] [phone]

## 2012-08-17 NOTE — Progress Notes (Signed)
Nutrition Follow-up  Intervention:   1. Initiate enteral nutrition of Jevity 1.2 formula via PEG at 15 ml/hr. Advance by 10 ml/hr every 8 hours, or as tolerated (and in conjunction with TPN weaning), to goal of 55 ml/hr. Goal TF regimen will provide: 1584 kcal, 74 grams protein, 1065 ml free water.  **TPN pharmacist: Once TF rate is >/= 40 ml/hr, pt will be meeting at least 60% of estimated kcal needs. 2. Once pt without IVF, recommend free water flushes of 200 ml QID. Total free water flushes and TF free water will provide: 1865 ml water daily. 3. RD to continue to follow nutrition care plan  Assessment:   Pt is s/p G-tube placement.  Patient is receiving TPN with Clinimix E 5/20 @ 65 ml/hr.  Lipids (20% IVFE @ 10 ml/hr), multivitamins, and trace elements are provided 3 times weekly (MWF) due to national backorder.  Provides 1578 kcal and 78 grams protein daily (based on weekly average).  Meets 100% minimum estimated kcal and 100% minimum estimated protein needs.  Per TPN pharmacist, pt is to continue at this goal of TPN until TF is providing >/= 60% of nutrition goals.  RN discussed TF initiation with Erin Hearing NP. Pt to begin TF today. TPN pharmacist aware.  Diet Order:  NPO  Meds: Scheduled Meds:    . bacitracin  1 application Topical BID  . chlorhexidine  5 mL Mouth/Throat QID  . cyanocobalamin  1,000 mcg Subcutaneous Q1200  . furosemide  40 mg Intravenous Daily  . insulin aspart  0-9 Units Subcutaneous Q4H  . magnesium sulfate 1 - 4 g bolus IVPB  2 g Intravenous Once  . magnesium sulfate 1 - 4 g bolus IVPB  2 g Intravenous Once  . metoprolol  5 mg Intravenous Q4H  . pantoprazole (PROTONIX) IV  40 mg Intravenous Q12H  . piperacillin-tazobactam (ZOSYN)  IV  3.375 g Intravenous Q8H  . potassium chloride  10 mEq Intravenous Q1 Hr x 3  . sodium chloride  10-40 mL Intracatheter Q12H  . sodium chloride  3 mL Intravenous Q12H   Continuous Infusions:    . sodium chloride 35  mL/hr at 08/13/12 0740  . fat emulsion Stopped (08/16/12 0850)  . fat emulsion    . feeding supplement (JEVITY 1.2 CAL)    . TPN (CLINIMIX) +/- additives Stopped (08/16/12 0850)  . TPN (CLINIMIX) +/- additives 65 mL/hr at 08/16/12 1745  . TPN (CLINIMIX) +/- additives     PRN Meds:.bacitracin, hydrALAZINE, LORazepam, morphine injection, ondansetron (ZOFRAN) IV, ondansetron (ZOFRAN) IV, ondansetron, sodium chloride, DISCONTD: acetaminophen, DISCONTD:  HYDROmorphone (DILAUDID) injection, DISCONTD:  morphine injection  Labs:  CMP     Component Value Date/Time   NA 142 08/17/2012 0450   K 3.9 08/17/2012 0450   CL 100 08/17/2012 0450   CO2 38* 08/17/2012 0450   GLUCOSE 158* 08/17/2012 0450   BUN 37* 08/17/2012 0450   CREATININE 0.87 08/17/2012 0450   CALCIUM 9.2 08/17/2012 0450   PROT 5.7* 08/16/2012 0500   ALBUMIN 2.1* 08/16/2012 0500   AST 36 08/16/2012 0500   ALT 28 08/16/2012 0500   ALKPHOS 256* 08/16/2012 0500   BILITOT 1.0 08/16/2012 0500   GFRNONAA 58* 08/17/2012 0450   GFRAA 67* 08/17/2012 0450    Phosphorus  Date Value Range Status  08/17/2012 3.4  2.3 - 4.6 mg/dL Final    Magnesium  Date Value Range Status  08/17/2012 1.8  1.5 - 2.5 mg/dL Final     Intake/Output  Summary (Last 24 hours) at 08/17/12 1041 Last data filed at 08/17/12 1012  Gross per 24 hour  Intake 2137.5 ml  Output   1500 ml  Net  637.5 ml  BM 10/11  CBG (last 3)   Basename 08/17/12 0810 08/17/12 0410 08/17/12 0011  GLUCAP 138* 157* 170*    Weight Status:  76.3 kg (10/18) -- stable  Body mass index is 27.99 kg/(m^2). Overweight.  Estimated needs:  1550-1850 kcals, 77-90 gm protein  Nutrition Dx:  Inadequate Oral Intake r/t inability to eat as evidenced by NPO status, ongoing  Goal:  TPN and TF to meet >90% of estimated protein needs, maximize energy provision as able during national lipid backorder, met  Monitor:  TPN prescription, initiation and tolerance of EN, weight, labs,  I/O's  Inda Coke MS, RD, LDN Pager: [phone] After-hours pager: [phone]

## 2012-08-17 NOTE — Progress Notes (Signed)
Occupational Therapy Treatment Patient Details Name: Amy [name] MRN: [id number] DOB: [birth year] Today's Date: 08/17/2012 Time: [id number] OT Time Calculation (min): 24 min  OT Assessment / Plan / Recommendation Comments on Treatment Session Splint fitting well, and pt. able to don with min A.  Pt. tolerated AROM Lt. wrist with only minimal pain.  Pt. with improved functional mobility    Follow Up Recommendations  Skilled nursing facility    Barriers to Discharge       Equipment Recommendations  None recommended by OT;None recommended by PT    Recommendations for Other Services    Frequency Min 2X/week   Plan Discharge plan remains appropriate    Precautions / Restrictions Precautions Precautions: Fall;Posterior Hip Precaution Booklet Issued: Yes (comment) Precaution Comments: OK to WB through [facility] with splint in place.  Okay to start AROM wrist and Digits Lt.  Required Braces or Orthoses: Other Brace/Splint Restrictions Weight Bearing Restrictions: Yes LUE Weight Bearing: Weight bearing as tolerated LLE Weight Bearing: Weight bearing as tolerated   Pertinent Vitals/Pain     ADL  Toilet Transfer: Simulated;+2 Total assistance Toilet Transfer: Patient Percentage: 50% Toilet Transfer Method: Stand pivot Science writer: Other (comment) (to recliner) Equipment Used: Rolling walker Transfers/Ambulation Related to ADLs: Pt. transferred to recliner with total A +2 (pt. 50% for sit to stand, and 60% for the transfer) ADL Comments: Pt. reports splint fitting fine with no pain, or problems.  Edema minimally decreased Lt. forearm.  Splint removed, skin checked.  Noted a scabbed area Lt. ulnar aspect that was not noted yesterday, but appears to be old. It is not painful.  No other signs of redness, or issues noted.  Pt. was instructed in splint wear and care.  Pt. able to don splint with min A.      OT Diagnosis:    OT Problem List:   OT Treatment Interventions:      OT Goals ADL Goals ADL Goal: Toilet Transfer - Progress: Progressing toward goals ADL Goal: Additional Goal #1 - Progress: Progressing toward goals Arm Goals Arm Goal: AROM - Progress: Progressing toward goal Arm Goal: Additional Goal #1 - Progress: Progressing toward goals Miscellaneous OT Goals OT Goal: Miscellaneous Goal #1 - Progress: Progressing toward goals OT Goal: Miscellaneous Goal #2 - Progress: Progressing toward goals  Visit Information  Last OT Received On: 08/17/12 Assistance Needed: +2 PT/OT Co-Evaluation/Treatment: Yes    Subjective Data      Prior Functioning       Cognition  Overall Cognitive Status: Appears within functional limits for tasks assessed/performed Arousal/Alertness: Awake/alert Orientation Level: Appears intact for tasks assessed Behavior During Session: [name] for tasks performed Cognition - Other Comments: Pt. much more alert today     Mobility  Shoulder Instructions Bed Mobility Bed Mobility: Supine to Sit;Sitting - Scoot to [employer] of Bed;Sit to Supine Rolling Right: 1: +2 Total assist Rolling Right: Patient Percentage: 30% Rolling Left: Not tested (comment) Right Sidelying to Sit: Not tested (comment) Left Sidelying to Sit: Not tested (comment) Supine to Sit: 1: +2 Total assist Supine to Sit: Patient Percentage: 30% Sitting - Scoot to Edge of Bed: 3: Mod assist Sitting - Scoot to Edge of Bed: Patient Percentage: 10% Sit to Supine: Not Tested (comment) Details for Bed Mobility Assistance: Assist for elevation of trunk and LEs.  Pain in abdomen limited patient.   Transfers Transfers: Sit to Stand;Stand to Sit Sit to Stand: 1: +2 Total assist;With upper extremity assist;From bed Sit to Stand: Patient  Percentage: 50% Stand to Sit: 1: +2 Total assist;With upper extremity assist;With armrests;To chair/3-in-1 Stand to Sit: Patient Percentage: 50% Details for Transfer Assistance: Physical assist needed to rise, stabilize as patient  initially leaning posteriorly and to control descent.  Pt. needed assist to move RW during stand pivot transfer as well.  Pt. able to shift weight without assist but needed max cues to sequence steps.         Exercises  General Exercises - Upper Extremity Wrist Flexion: AROM;Left;10 reps;Supine Wrist Extension: AROM;Left;10 reps;Supine Digit Composite Flexion: AROM;10 reps;Supine Composite Extension: AROM;10 reps;Supine   Balance Static Sitting Balance Static Sitting - Balance Support: Right upper extremity supported;Feet supported Static Sitting - Level of Assistance: 5: Stand by assistance Static Sitting - Comment/# of Minutes: 5 mins EOB.     End of Session OT - End of Session Activity Tolerance: Patient limited by pain Patient left: with call bell/phone within reach;with family/visitor present;in chair Nurse Communication: Mobility status;Patient requests pain meds  GO     Amy [name], Ellard Artis M 08/17/2012, 3:05 PM

## 2012-08-17 NOTE — Progress Notes (Signed)
PARENTERAL NUTRITION CONSULT NOTE - FOLLOW UP  Pharmacy Consult for TPN Indication: Esophageal perforation, s/p esophagoscopy and I&D  Allergies  Allergen Reactions  . Hydrocodone-Acetaminophen Itching    Tolerates tylenol    Patient Measurements: Height: 5\' 5"  (165.1 cm) Weight: 168 lb 3.4 oz (76.3 kg) IBW/kg (Calculated) : 57  Adjusted Body Weight: 65 Kg  Vital Signs: Temp: 98.2 F (36.8 C) (10/18 0800) Temp src: Oral (10/18 0800) BP: 140/54 mmHg (10/18 0800) Pulse Rate: 80  (10/18 0800) Intake/Output from previous day: 10/17 0701 - 10/18 0700 In: 2145 [I.V.:540; IV Piggyback:75; TPN:1530] Out: 1100 [Urine:1100] Intake/Output from this shift: Total I/O In: 77.5 [IV Piggyback:12.5; TPN:65] Out: -   Labs:  Basename 08/17/12 0450 08/16/12 0500  WBC 10.2 8.2  HGB 9.5* 11.4*  HCT 29.5* 35.5*  PLT 234 216  APTT -- --  INR -- --     Basename 08/17/12 0450 08/16/12 0500 08/15/12 0505  NA 142 142 144  K 3.9 3.5 3.5  CL 100 98 103  CO2 38* 41* 38*  GLUCOSE 158* 152* 159*  BUN 37* 37* 38*  CREATININE 0.87 0.88 0.90  LABCREA -- -- --  CREAT24HRUR -- -- --  CALCIUM 9.2 9.6 9.5  MG 1.8 1.9 1.7  PHOS 3.4 3.1 3.0  PROT -- 5.7* --  ALBUMIN -- 2.1* --  AST -- 36 --  ALT -- 28 --  ALKPHOS -- 256* --  BILITOT -- 1.0 --  BILIDIR -- -- --  IBILI -- -- --  PREALBUMIN -- -- --  TRIG -- -- --  CHOLHDL -- -- --  CHOL -- -- --   Estimated Creatinine Clearance: 45.7 ml/min (by C-G formula based on Cr of 0.87).    Basename 08/17/12 0410 08/17/12 0011 08/16/12 2023  GLUCAP 157* 170* 172*    Insulin Requirements in the past 24 hours:  7 units SSI  Current Nutrition:  TPN  Nutritional Goals:  1550-1800 kCal, 77-90 grams of protein per day per RD assessment  Assessment:  Patient presented to [facility] s/p fall and fractures needing repair, she was transferred here for hip and wrist fracture repair. S/p fracture repair she complained of odynophagia and underwent upper  endoscopy per GI, which showed a contained esophageal perforation. She is s/p Incision and drainage, right neck and mediastinum.   GI: Currently NPO, plans to keep this way for now to allow healing of esophageal tear, plans for gastrograffin swallow in a few days. Noted patient has been without significant nutrients since 10/8 until TPN started 10/12. Ms. Donatelli is POD#1 s/p open GT placement, noted surgeon indicated ok to start TF today. Endo: No hx DM, A1c 5.5% at BL, CBG slightly elevated (range 150-172) - SSI need has remained stable. Lytes: Mg slightly below goal of 2, other Lytes are WNL today. Renal: Scr stable at 0.88 today, UOP 1.2 - daily lasix Pulm: 2L Remer  Cards: VSS - continues on IV lopressor  Hepatobil: LFTs except alk phos is slightly elevated (trending up), cholesterol and triglycerides are WNL as of 10/13, prealbumin low at 4.3 as of 10/13. Suspect may see slightly low prealbumin d/t ongoing inflammation and post-op status. ID: Zosyn#[id number] empirically for esophageal perf. WBC wnl, afebrile.  Best Practices: SCDs, PPI IV TPN Access: Triple lumen PICC  TPN day#: 6  Plan:  - Continue Clinimix E 5/20 at 65cc/hr (Provides goal protein of 78gm/day and average est Kcal of 1552 Kcal based on three times a week lipid supplementation). Will continue  this until TF are at > or = 60% of goals. - Will supplement MVI, trace elements and IV fats on MWF only d/t ongoing Engineer, manufacturing systems.  - Repeat Magnesium 2gm IV x 1 - f/u TF progress (no orders yet, noted RD recs)  Thanks, Jaeli Grubb K. Posey Pronto, PharmD, BCPS.  Clinical Pharmacist Pager [phone]. 08/17/2012 9:01 AM

## 2012-08-17 NOTE — Progress Notes (Signed)
Physical Therapy Treatment Patient Details Name: Amy [name] MRN: [id number] DOB: 08-24-1922 Today's Date: 08/17/2012 Time: [id number] PT Time Calculation (min): 24 min  PT Assessment / Plan / Recommendation Comments on Treatment Session  Pt. s/p L hip hemiarthroplasty and L forearm ORIF.  Post op esophageal tear which required surgery 08/08/12.  Progressing daily.      Follow Up Recommendations  Post acute inpatient     Does the patient have the potential to tolerate intense rehabilitation  No, Recommend LTACH  Barriers to Discharge        Equipment Recommendations  None recommended by PT    Recommendations for Other Services    Frequency Min 4X/week   Plan Discharge plan remains appropriate;Frequency remains appropriate    Precautions / Restrictions Precautions Precautions: Fall;Posterior Hip Precaution Booklet Issued: Yes (comment) Required Braces or Orthoses: Other Brace/Splint Restrictions Weight Bearing Restrictions: Yes LUE Weight Bearing: Weight bearing as tolerated LLE Weight Bearing: Weight bearing as tolerated   Pertinent Vitals/Pain VSS, Some pain    Mobility  Bed Mobility Bed Mobility: Rolling Right;Right Sidelying to Sit;Sitting - Scoot to [employer] of Bed Rolling Right: 1: +2 Total assist Rolling Right: Patient Percentage: 30% Rolling Left: Not tested (comment) Right Sidelying to Sit: Not tested (comment) Left Sidelying to Sit: Not tested (comment) Supine to Sit: 1: +2 Total assist Supine to Sit: Patient Percentage: 30% Sitting - Scoot to Edge of Bed: 3: Mod assist Sit to Supine: Not Tested (comment) Details for Bed Mobility Assistance: Assist for elevation of trunk and LEs.  Pain in abdomen limited patient.   Transfers Transfers: Sit to Stand;Stand to Sit;Stand Pivot Transfers Sit to Stand: 1: +2 Total assist;With upper extremity assist;From bed Sit to Stand: Patient Percentage: 50% Stand to Sit: 1: +2 Total assist;With upper extremity assist;With  armrests;To chair/3-in-1 Stand to Sit: Patient Percentage: 50% Stand Pivot Transfers: 1: +2 Total assist Stand Pivot Transfers: Patient Percentage: 60% Details for Transfer Assistance: Physical assist needed to rise, stabilize as patient initially leaning posteriorly and to control descent.  Pt. needed assist to move RW during stand pivot transfer as well.  Pt. able to shift weight without assist but needed max cues to sequence steps.   Ambulation/Gait Ambulation/Gait Assistance: Not tested (comment) Stairs: No Wheelchair Mobility Wheelchair Mobility: No    PT Goals Acute Rehab PT Goals PT Goal: Supine/Side to Sit - Progress: Progressing toward goal PT Goal: Sit to Stand - Progress: Progressing toward goal PT Goal: Stand to Sit - Progress: Progressing toward goal PT Transfer Goal: Bed to Chair/Chair to Bed - Progress: Progressing toward goal  Visit Information  Last PT Received On: 08/17/12 Assistance Needed: +2 PT/OT Co-Evaluation/Treatment: Yes    Subjective Data  Subjective: "I want to get up."   Cognition  Overall Cognitive Status: Appears within functional limits for tasks assessed/performed Arousal/Alertness: Awake/alert Orientation Level: Appears intact for tasks assessed Behavior During Session: [facility] for tasks performed    Balance  Static Sitting Balance Static Sitting - Balance Support: Right upper extremity supported;Feet supported Static Sitting - Level of Assistance: 5: Stand by assistance Static Sitting - Comment/# of Minutes: 5 mins EOB.    End of Session PT - End of Session Equipment Utilized During Treatment: Gait belt;Oxygen Activity Tolerance: Patient limited by fatigue Patient left: in chair;with call bell/phone within reach;with family/visitor present Nurse Communication: Mobility status       INGOLD,Daphane Odekirk 08/17/2012, 2:00 PM  [facility] Acute Rehabilitation [phone] (pager)

## 2012-08-17 NOTE — Progress Notes (Signed)
[facility] Progress Note [place] TEAM 1 - Stepdown/ICU TEAM   Niger M Harriger [id number] DOB: 10/18/1922 DOA: 08/07/2012 PCP: Purvis Kilts, MD  Brief narrative: 78 year old female who lives alone. She experienced a mechanical fall going up the steps at home. This was not associated with loss of consciousness, prefall chest pain palpitations or lightheadedness. Despite this the daughter reports patient had been having suboptimal blood pressures with systolics in the [id number] - the patient had been complaining of some nonspecific dizziness. The patient was transported to the hospital via EMS and she was found to be in atrial fibrillation with RVR with a ventricular response of 120. She subsequently converted back to sinus rhythm. She was initially treated at [facility]. After arrival she was found to have an impacted fracture of left hip as well as a left radial head fracture. Because of her underlying multiple medical problems including coronary artery disease and chronic kidney disease she was subsequently transported to [facility] for further monitoring and treatment. She eventually underwent surgical treatment for her fractures. Because of complaints of odynophagia a swallowing evaluation was obtained which showed no evidence of oral pharyngeal dysphagia - she continued with odynophagia so a gastroenterology consult was obtained. She subsequently underwent EGD which revealed a mucosal defect in the cervical esophagus that was either a ulceration versus a perforation. She eventually underwent an esophagram with Gastrografin that confirmed a contained perforation. Cardiothoracic surgery was consulted. They felt that the etiology to the small perforation was related to a traumatic injury that was related to intubation at time of her initial orthopedic surgery on October 9. An ENT consult was obtained as well as a CT scan of the neck and chest to rule out any significant fluid collections.  The CT did reveal extensive retropharyngeal gas dissecting upwards from the mediastinum with an associated focal perforation of the esophagus at the level of the cricoid cartilage with oral contrast extravasating into the retro-pharyngeal spacing communicating with the superior mediastinum. Subsequently the patient underwent incision and drainage of this area by ENT surgeon. Currently she is stable on parenteral nutrition in the step down unit. Team 1 assumed care of this patient on 08/14/2012.  Impression and Plan:  Fracture of left hip/radial head after fall at home: A) Status post hip hemiarthroplasty left hip by Dr Ronnie Derby 08/08/2012 B) s/p ORIF by Dr Burney Gauze 08/08/2012 *Management per orthopedic team *Continue OT and PT as tolerated *Would continue IV narcotics until patient proves can tolerate tube feedings and would convert to medications that are much longer acting via G-tube  Odynophagia, new traumatic peri op esophageal perforation seen on endoscopy: A) Mediastinitis due to esophageal perforation - s/p Incision and drainage, right neck and mediastinum, transcervical approach. Cervical esophagoscopy *Appreciate Cardiothoracic and ENT assistance *Status post placement of a gastrostomy tube on 08/16/2012 by general surgery *ENT adding nasal hygiene measures and Peridex oral swish and spit *Continue empiric IV Zosyn *Will eventually need repeat Gastrografin study in next 7-10 days *CVTS notes patient with significant volume JP drain and therefore perforation likely not closed and this will likely take a long period of time to occur  Acute respiratory failure with hypoxia due to Pulmonary edema, noncardiac *Discontinue Lasix and follow fluid balance *Continue supportive care with oxygen and pulmonary toileting   Acute blood loss anemia with macrocytosis *Status post transfusion of packed red blood cells this admission *Hemoglobin nadir 6.1 on 08/12/2012 *Likely etiology due to multiple  recent surgical procedures *B12 sub optimal at  299 - have begun B12 repletion.   Hypertension *Change to scheduled hydralazine for better blood pressure control *Has postoperative increases in blood pressure likely related to pain *Once okay to use G-tube begin "oral" antihypertensives once demonstrates can't tolerate tube feeding at full rate  Protein calorie malnutrition/hyperglycemia status post open gastrostomy tube 08/16/2012 *Pharmacy managing parenteral nutrition and lipids as well as electrolytes-will discontinue once demonstrates tolerates tube feeding *Begin Jevity 1.2 today and titrate up to goal rate of 55 cc per hour *Once proves can tolerate rate tube feedings will also need to have free water at 200 cc 4 times a day *Continue supplemental sliding scale insulin  CAD, CABG X 3 10/09. Myoview low risk 10/10. EF >55% 2D 6/13 *Received preoperative cardiac clearance and has remained asymptomatic since admission  Chronic renal insufficiency, stage III (moderate), SCr 1.4 Sept 2013 *Renal function remains stable at usual baseline  PAF/SSS history of Pacemaker March 2010 (MDT) *Remains stable with sinus rhythm and sinus tachycardia  Hyperlipidemia, statin intol. *N.p.o.  Aortic valve sclerosis, no stenosis  Carotid bruit present bilat, dopplers OK 6/13 *Neurologically stable postop  History of UTI - recent completion 10 days Cipro *Urinalysis at presentation negative for infectious process   Hx of dizziness *Initial fall reported as mechanical so no indication to pursue syncope work - dizziness could be rate issue in setting of afib  DVT after previous hip surgery *Continue SCDs  DVT prophylaxis: SCDs Code Status: DO NOT RESUSCITATE Family Communication: Spoke with patient's daughter  Disposition Plan: Remain in stepdown  Consultants: Orthopedic Cardiothoracic Gastroenterology ENT Gen Surgery  Procedures: ORIF distal left radial fracture with carpal tunnel  release by Dr. Burney Gauze and left arthroplasty bipolar hip on 08/08/2012 by Dr. Ronnie Derby  EGD 08/10/2012 by Dr. Benson Norway  Incision and drainage of right neck and mediastinum with a transcervical approach as well as cervical esophagoscopy on [id number] by Dr. Erik Obey  Status post open gastrostomy tube placement with #24 [place] tube by Dr. Grandville Silos 08/16/2012  Antibiotics: Zosyn 10/11 >>>  HPI/Subjective: Alert but confused today. Thought the nurse had given her pills to take with water. Discussed with patient and daughter that confusion expected given patient's age and unfamiliar surroundings in setting of surgical procedures and IV narcotics.  Objective: Blood pressure 140/54, pulse 80, temperature 98.2 F (36.8 C), temperature source Oral, resp. rate 14, height 5\' 5"  (1.651 m), weight 76.3 kg (168 lb 3.4 oz), SpO2 96.00%.  Intake/Output Summary (Last 24 hours) at 08/17/12 0946 Last data filed at 08/17/12 0900  Gross per 24 hour  Intake 2137.5 ml  Output   1200 ml  Net  937.5 ml     Exam: General: Minimal respiratory distress ENT: Neck incisions covered by dressing which is clean dry and intact Lungs: Fine bibasilar crackles and diminished in the bases, nasal cannula oxygen Cardiovascular: Regular rate and rhythm without murmur gallop or rub, trace peripheral edema Abdomen: Mildly tender over her abdominal surgical incision, nondistended, soft, bowel sounds positive, no rebound, no ascites, no appreciable mass, has new abdominal dressing which is clean dry and intact-gastrostomy tube to straight drain with green bilious returns Musculoskeletal: No significant cyanosis, clubbing of bilateral lower extremities; cast to left forearm and wrist has been replaced with Ace wrap and splint Neurological: Alert and mildly confused but otherwise appropriate. No appreciable neurological deficit  Data Reviewed: Basic Metabolic Panel:  Lab 0000000 0450 08/16/12 0500 08/15/12 0505 08/14/12 0550  08/13/12 0400  NA 142 142 144 143 142  K  3.9 3.5 3.5 3.7 3.9  CL 100 98 103 105 105  CO2 38* 41* 38* 32 30  GLUCOSE 158* 152* 159* 155* 191*  BUN 37* 37* 38* 33* 32*  CREATININE 0.87 0.88 0.90 0.83 0.96  CALCIUM 9.2 9.6 9.5 9.6 9.0  MG 1.8 1.9 1.7 1.9 1.9  PHOS 3.4 3.1 3.0 2.2* 2.7   Liver Function Tests:  Lab 08/16/12 0500 08/13/12 0400 08/12/12 0450 08/11/12 0528  AST 36 33 17 27  ALT 28 14 7 9   ALKPHOS 256* 221* 92 121*  BILITOT 1.0 1.0 0.5 0.6  PROT 5.7* 5.1* 5.1* 5.7*  ALBUMIN 2.1* 1.8* 1.8* 2.2*   CBC:  Lab 08/17/12 0450 08/16/12 0500 08/14/12 0444 08/13/12 0400 08/12/12 0730 08/12/12 0450  WBC 10.2 8.2 7.2 8.2 8.9 --  NEUTROABS -- -- -- 6.8 -- 6.7  HGB 9.5* 11.4* 9.1* 9.6* 6.1* --  HCT 29.5* 35.5* 35.1* 28.9* 18.8* --  MCV 91.9 91.3 117.4* 88.1 91.7 --  PLT 234 216 171 157 179 --   CBG:  Lab 08/17/12 0810 08/17/12 0410 08/17/12 0011 08/16/12 2023 08/16/12 1600  GLUCAP 138* 157* 170* 172* 189*    Recent Results (from the past 240 hour(s))  URINE CULTURE     Status: Normal   Collection Time   08/07/12 11:24 AM      Component Value Range Status Comment   Specimen Description URINE, CATHETERIZED   Final    Special Requests NONE   Final    Culture  Setup Time 08/07/2012 16:50   Final    Colony Count NO GROWTH   Final    Culture NO GROWTH   Final    Report Status 08/08/2012 FINAL   Final   SURGICAL PCR SCREEN     Status: Normal   Collection Time   08/08/12  5:32 AM      Component Value Range Status Comment   MRSA, PCR NEGATIVE  NEGATIVE Final    Staphylococcus aureus NEGATIVE  NEGATIVE Final   WOUND CULTURE     Status: Normal   Collection Time   08/10/12 10:29 PM      Component Value Range Status Comment   Specimen Description WOUND ESOPHAGUS   Final    Special Requests POF ZOSYN   Final    Gram Stain     Final    Value: FEW WBC PRESENT, PREDOMINANTLY PMN     RARE SQUAMOUS EPITHELIAL CELLS PRESENT     ABUNDANT GRAM POSITIVE RODS     FEW GRAM NEGATIVE  RODS   Culture     Final    Value: MULTIPLE ORGANISMS PRESENT, NONE PREDOMINANT NO STAPHYLOCOCCUS AUREUS ISOLATED NO GROUP A STREP (S.PYOGENES) ISOLATED   Report Status 08/14/2012 FINAL   Final   ANAEROBIC CULTURE     Status: Normal   Collection Time   08/10/12 10:29 PM      Component Value Range Status Comment   Specimen Description WOUND ESOPHAGUS   Final    Special Requests PATIENT ON FOLLOWING ZOSYN   Final    Gram Stain     Final    Value: FEW WBC PRESENT, PREDOMINANTLY PMN     RARE SQUAMOUS EPITHELIAL CELLS PRESENT     ABUNDANT GRAM POSITIVE RODS     FEW GRAM NEGATIVE RODS   Culture NO ANAEROBES ISOLATED   Final    Report Status 08/15/2012 FINAL   Final      Studies:  Recent x-ray studies have been reviewed in  detail by the Attending Physician  Scheduled Meds:  Reviewed in detail by the Attending Physician   Erin Hearing, [place] [facility] Office  [phone] Pager [phone]  On-Call/Text Page:      [email or web address]      password [email or web address]  If 7PM-7AM, please contact night-coverage [email or web address] Password [email or web address] 08/17/2012, 9:46 AM   LOS: 10 days   I have personally examined this patient and reviewed the entire database. I have reviewed the above note, made any necessary editorial changes, and agree with its content.  Cherene Altes, MD [facility]

## 2012-08-18 ENCOUNTER — Inpatient Hospital Stay (HOSPITAL_COMMUNITY): Payer: Medicare Other

## 2012-08-18 LAB — GLUCOSE, CAPILLARY
Glucose-Capillary: 143 mg/dL — ABNORMAL HIGH (ref 70–99)
Glucose-Capillary: 164 mg/dL — ABNORMAL HIGH (ref 70–99)
Glucose-Capillary: 165 mg/dL — ABNORMAL HIGH (ref 70–99)
Glucose-Capillary: 172 mg/dL — ABNORMAL HIGH (ref 70–99)
Glucose-Capillary: 182 mg/dL — ABNORMAL HIGH (ref 70–99)
Glucose-Capillary: 209 mg/dL — ABNORMAL HIGH (ref 70–99)
Glucose-Capillary: 213 mg/dL — ABNORMAL HIGH (ref 70–99)

## 2012-08-18 LAB — CBC
HCT: 29.9 % — ABNORMAL LOW (ref 36.0–46.0)
Hemoglobin: 9.3 g/dL — ABNORMAL LOW (ref 12.0–15.0)
WBC: 9.9 10*3/uL (ref 4.0–10.5)

## 2012-08-18 MED ORDER — WHITE PETROLATUM GEL
Status: AC
  Administered 2012-08-18: 09:00:00
  Filled 2012-08-18: qty 5

## 2012-08-18 MED ORDER — FUROSEMIDE 10 MG/ML IJ SOLN
40.0000 mg | Freq: Once | INTRAMUSCULAR | Status: AC
  Administered 2012-08-18: 40 mg via INTRAVENOUS
  Filled 2012-08-18 (×2): qty 4

## 2012-08-18 MED ORDER — OXYCODONE HCL 5 MG/5ML PO SOLN
5.0000 mg | ORAL | Status: DC | PRN
  Administered 2012-08-18: 10 mg
  Administered 2012-08-19 (×2): 5 mg
  Administered 2012-08-19 (×2): 10 mg
  Administered 2012-08-20 – 2012-08-23 (×7): 5 mg
  Filled 2012-08-18 (×2): qty 5
  Filled 2012-08-18 (×2): qty 10
  Filled 2012-08-18 (×5): qty 5
  Filled 2012-08-18: qty 10
  Filled 2012-08-18 (×3): qty 5

## 2012-08-18 NOTE — Progress Notes (Signed)
[facility] Progress Note [place] TEAM 1 - Stepdown/ICU TEAM   Niger M Charpentier [id number] DOB: 12-Nov-1921 DOA: 08/07/2012 PCP: Purvis Kilts, MD  Brief narrative: 78 year old female who lives alone. She experienced a mechanical fall going up the steps at home. This was not associated with loss of consciousness, prefall chest pain palpitations or lightheadedness. Despite this the daughter reports patient had been having suboptimal blood pressures with systolics in the [id number] - the patient had been complaining of some nonspecific dizziness. The patient was transported to the hospital via EMS and she was found to be in atrial fibrillation with RVR with a ventricular response of 120. She subsequently converted back to sinus rhythm. She was initially treated at [facility] After arrival she was found to have an impacted fracture of left hip as well as a left radial head fracture. Because of her underlying multiple medical problems including coronary artery disease and chronic kidney disease she was subsequently transported to [facility], [place] for further monitoring and treatment. She eventually underwent surgical treatment for her fractures. Because of complaints of odynophagia a swallowing evaluation was obtained which showed no evidence of oral pharyngeal dysphagia - she continued with odynophagia so a gastroenterology consult was obtained. She subsequently underwent EGD which revealed a mucosal defect in the cervical esophagus that was either a ulceration versus a perforation. She eventually underwent an esophagram with Gastrografin that confirmed a contained perforation. Cardiothoracic surgery was consulted. They felt that the etiology to the small perforation was related to a traumatic injury that was related to intubation at time of her initial orthopedic surgery on October 9. An ENT consult was obtained as well as a CT scan of the neck and chest to rule out any significant fluid collections.  The CT did reveal extensive retropharyngeal gas dissecting upwards from the mediastinum with an associated focal perforation of the esophagus at the level of the cricoid cartilage with oral contrast extravasating into the retro-pharyngeal spacing communicating with the superior mediastinum. Subsequently the patient underwent incision and drainage of this area by ENT surgeon. Currently she is stable on parenteral nutrition in the step down unit. Team 1 assumed care of this patient on 08/14/2012.  Impression and Plan:  Fracture of left hip/radial head after fall at home: A) Status post hip hemiarthroplasty left hip by Dr Ronnie Derby 08/08/2012 B) s/p ORIF by Dr Burney Gauze 08/08/2012 *Management per orthopedic team *Continue OT and PT  *Continue IV narcotics until patient proves can tolerate tube feedings   Odynophagia, new traumatic peri-op esophageal perforation seen on endoscopy - Mediastinitis due to esophageal perforation  *s/p Incision and drainage, right neck and mediastinum, transcervical approach *Appreciate Cardiothoracic and ENT assistance *Status post placement of a gastrostomy tube on 08/16/2012 by general surgery *Continue empiric IV Zosyn *Will eventually need repeat Gastrografin study in next 7-10 days  Acute respiratory failure with hypoxia due to Pulmonary edema, noncardiac *Discontinue Lasix and follow fluid balance *Continue supportive care with oxygen and pulmonary toileting   Acute blood loss anemia with macrocytosis *Status post transfusion of packed red blood cells this admission *Hemoglobin nadir 6.1 on 08/12/2012 *Likely etiology due to multiple recent surgical procedures *B12 sub optimal at 299 - have begun B12 repletion.   Hypertension *Change to scheduled hydralazine for better blood pressure control *Has postoperative increases in blood pressure likely related to pain *Once okay to use G-tube begin "oral" antihypertensives once demonstrates can't tolerate tube feeding  at full rate  Protein calorie malnutrition/hyperglycemia status post open gastrostomy  tube 08/16/2012 *Pharmacy managing parenteral nutrition and lipids as well as electrolytes-will discontinue once demonstrates tolerates tube feeding *Begin Jevity 1.2 today and titrate up to goal rate of 55 cc per hour *Once proves can tolerate rate tube feedings will also need to have free water at 200 cc 4 times a day *Continue supplemental sliding scale insulin  CAD, CABG X 3 10/09. Myoview low risk 10/10. EF >55% 2D 6/13 *Received preoperative cardiac clearance and has remained asymptomatic since admission  Chronic renal insufficiency, stage III (moderate), SCr 1.4 Sept 2013 *Renal function remains stable at usual baseline  PAF/SSS history of Pacemaker March 2010 (MDT) *Remains stable with sinus rhythm and sinus tachycardia  Hyperlipidemia, statin intol. *N.p.o.  Aortic valve sclerosis, no stenosis  Carotid bruit present bilat, dopplers OK 6/13 *Neurologically stable postop  History of UTI - recent completion 10 days Cipro *Urinalysis at presentation negative for infectious process   Hx of dizziness *Initial fall reported as mechanical so no indication to pursue syncope work - dizziness could be rate issue in setting of afib  DVT after previous hip surgery *Continue SCDs  DVT prophylaxis: SCDs Code Status: DO NOT RESUSCITATE Family Communication: Spoke with patient's daughter  Disposition Plan: Remain in stepdown  Consultants: Orthopedic Cardiothoracic Gastroenterology ENT Gen Surgery  Procedures: ORIF distal left radial fracture with carpal tunnel release by Dr. Burney Gauze and left arthroplasty bipolar hip on 08/08/2012 by Dr. Ronnie Derby  EGD 08/10/2012 by Dr. Benson Norway  Incision and drainage of right neck and mediastinum with a transcervical approach as well as cervical esophagoscopy on [id number] by Dr. Erik Obey  Status post open gastrostomy tube placement with #24 [place] tube by Dr.  Grandville Silos 08/16/2012  Antibiotics: Zosyn 10/11 >>>  HPI/Subjective: The pt is suffering w/ poorly controlled pain today, as per her daughter.  She is thus far tolerating her TF w/ no evidence of N/V/D.    Objective: Blood pressure 135/53, pulse 114, temperature 98.3 F (36.8 C), temperature source Oral, resp. rate 29, height 5\' 5"  (1.651 m), weight 74 kg (163 lb 2.3 oz), SpO2 90.00%.  Intake/Output Summary (Last 24 hours) at 08/18/12 1212 Last data filed at 08/18/12 0800  Gross per 24 hour  Intake   1580 ml  Output   2850 ml  Net  -1270 ml     Exam: General: Minimal respiratory distress ENT: Neck incisions covered by dressing which is clean dry and intact Lungs: Fine bibasilar crackles and diminished in the bases Cardiovascular: tachycardic but regular - no gallup or rub Abdomen: Mildly tender over her abdominal surgical incision, nondistended, soft, bowel sounds positive, no rebound, no ascites, no appreciable mass, has new abdominal dressing which is clean dry and intact-gastrostomy tube to straight drain with green bilious returns Musculoskeletal: No significant cyanosis or clubbing of bilateral lower extremities Neurological: Alert and mildly confused but otherwise appropriate. No appreciable neurological deficit  Data Reviewed: Basic Metabolic Panel:  Lab 0000000 0450 08/16/12 0500 08/15/12 0505 08/14/12 0550 08/13/12 0400  NA 142 142 144 143 142  K 3.9 3.5 3.5 3.7 3.9  CL 100 98 103 105 105  CO2 38* 41* 38* 32 30  GLUCOSE 158* 152* 159* 155* 191*  BUN 37* 37* 38* 33* 32*  CREATININE 0.87 0.88 0.90 0.83 0.96  CALCIUM 9.2 9.6 9.5 9.6 9.0  MG 1.8 1.9 1.7 1.9 1.9  PHOS 3.4 3.1 3.0 2.2* 2.7   Liver Function Tests:  Lab 08/16/12 0500 08/13/12 0400 08/12/12 0450  AST 36 33 17  ALT  28 14 7   ALKPHOS 256* 221* 92  BILITOT 1.0 1.0 0.5  PROT 5.7* 5.1* 5.1*  ALBUMIN 2.1* 1.8* 1.8*   CBC:  Lab 08/18/12 0500 08/17/12 1700 08/17/12 0450 08/16/12 0500 08/14/12 0444  08/13/12 0400 08/12/12 0450  WBC 9.9 -- 10.2 8.2 7.2 8.2 --  NEUTROABS -- -- -- -- -- 6.8 6.7  HGB 9.3* 8.9* 9.5* 11.4* 9.1* -- --  HCT 29.9* 33.8* 29.5* 35.5* 35.1* -- --  MCV 92.3 -- 91.9 91.3 117.4* 88.1 --  PLT 274 -- 234 216 171 157 --   CBG:  Lab 08/18/12 0851 08/18/12 0442 08/18/12 0009 08/17/12 2005 08/17/12 1549  GLUCAP 182* 195* 143* 164* 148*    Recent Results (from the past 240 hour(s))  WOUND CULTURE     Status: Normal   Collection Time   08/10/12 10:29 PM      Component Value Range Status Comment   Specimen Description WOUND ESOPHAGUS   Final    Special Requests POF ZOSYN   Final    Gram Stain     Final    Value: FEW WBC PRESENT, PREDOMINANTLY PMN     RARE SQUAMOUS EPITHELIAL CELLS PRESENT     ABUNDANT GRAM POSITIVE RODS     FEW GRAM NEGATIVE RODS   Culture     Final    Value: MULTIPLE ORGANISMS PRESENT, NONE PREDOMINANT NO STAPHYLOCOCCUS AUREUS ISOLATED NO GROUP A STREP (S.PYOGENES) ISOLATED   Report Status 08/14/2012 FINAL   Final   ANAEROBIC CULTURE     Status: Normal   Collection Time   08/10/12 10:29 PM      Component Value Range Status Comment   Specimen Description WOUND ESOPHAGUS   Final    Special Requests PATIENT ON FOLLOWING ZOSYN   Final    Gram Stain     Final    Value: FEW WBC PRESENT, PREDOMINANTLY PMN     RARE SQUAMOUS EPITHELIAL CELLS PRESENT     ABUNDANT GRAM POSITIVE RODS     FEW GRAM NEGATIVE RODS   Culture NO ANAEROBES ISOLATED   Final    Report Status 08/15/2012 FINAL   Final      Studies:  Recent x-ray studies have been reviewed in detail by the Attending Physician  Scheduled Meds:  Reviewed in detail by the Attending Physician   Cherene Altes, MD [facility] Office  [phone] Pager [phone]  On-Call/Text Page:      [email or web address]      password [email or web address]  08/18/2012, 12:12 PM   LOS: 11 days

## 2012-08-18 NOTE — Progress Notes (Signed)
Chaplain note: while on my way to look for another patient I saw patient's daughter standing outside patient's room...leaning against the wall looking tired.  I stopped and engaged her in conversation.  We ended up having a wonderful visit right there in the hallway while patient was being worked on.  Patient's daughter shared that her father died just 4 months ago...parents had been married 49 years. She requested prayer for her mother and I prayed with her before I left.  Will continue to provide spiritual and emotional support as needed.  [place]

## 2012-08-18 NOTE — Progress Notes (Signed)
Pt. Tachypneic, ruddy, and tachycardic.  NRB mask applied after pt. Given pain medicine at approx. 1645.  Dr. Thereasa Solo notified.  See NO.  Pt.s breathing relaxed.  Daughter at bedside.  Daughter and patient supported and explanation given of current plan of care.   Will update MD as warranted and monitor closely.

## 2012-08-18 NOTE — Progress Notes (Signed)
Physical Therapy Treatment Patient Details Name: Amy [name] MRN: [id number] DOB: 03/14/1922 Today's Date: 08/18/2012 Time: [id number] PT Time Calculation (min): 32 min  PT Assessment / Plan / Recommendation Comments on Treatment Session  Pt. s/p L hip hemiarthroplasty and L forearm ORIF.  Post op esophageal tear which required surgery 08/08/12.  PEG placed 08/16/12.  Ambulated today and making steady progress.     Follow Up Recommendations  Post acute inpatient     Does the patient have the potential to tolerate intense rehabilitation  No, Recommend LTACH  Barriers to Discharge        Equipment Recommendations   (?platform attachment for walker)    Recommendations for Other Services    Frequency Min 4X/week   Plan Discharge plan remains appropriate;Frequency remains appropriate    Precautions / Restrictions Precautions Precautions: Fall;Posterior Hip Precaution Booklet Issued: Yes (comment) Precaution Comments: OK to WB through [place] with splint in place.  Okay to start AROM wrist and Digits Lt.  Restrictions LUE Weight Bearing: Weight bearing as tolerated LLE Weight Bearing: Weight bearing as tolerated   Pertinent Vitals/Pain HR up to 133 bpm, Some pain    Mobility  Bed Mobility Bed Mobility: Supine to Sit;Sitting - Scoot to [employer] of Bed Rolling Right: Not tested (comment) Rolling Left: Not tested (comment) Right Sidelying to Sit: Not tested (comment) Left Sidelying to Sit: Not tested (comment) Supine to Sit: 1: +2 Total assist Supine to Sit: Patient Percentage: 30% Sitting - Scoot to Edge of Bed: 3: Mod assist Sit to Supine: Not Tested (comment) Details for Bed Mobility Assistance: Pt. needed cues for technique as well as assist.  Needs extensive assist to elevate trunk due to her abdominal pain   Transfers Transfers: Sit to Stand;Stand to Sit Sit to Stand: 1: +2 Total assist;With upper extremity assist;From bed Sit to Stand: Patient Percentage: 50% Stand  to Sit: 1: +2 Total assist;With upper extremity assist;With armrests;To chair/3-in-1 Stand to Sit: Patient Percentage: 50% Stand Pivot Transfers: Not tested (comment) Details for Transfer Assistance: Pt continues to need assist to rise and to stablilize initially upon standing.  Needed cues for hand placement as well.   Ambulation/Gait Ambulation/Gait Assistance: 1: +2 Total assist Ambulation/Gait: Patient Percentage: 70% Ambulation Distance (Feet): 8 Feet Assistive device: Left platform walker;Rolling walker Ambulation/Gait Assistance Details: verbal and tactile cues for erect posture and sequencing RW and steps.  Pt. taking small steps and needed cues to incr step length.  Had to assist patient in moving RW as well.   Gait Pattern: Step-to pattern;Decreased stride length;Shuffle;Trunk flexed;Decreased step length - left;Decreased hip/knee flexion - left;Decreased weight shift to left Gait velocity: decreased Stairs: No Wheelchair Mobility Wheelchair Mobility: No    Exercises Total Joint Exercises Ankle Circles/Pumps: AROM;Both;10 reps;Supine Heel Slides: AAROM;Supine;5 reps;Left Hip ABduction/ADduction: AAROM;Left;Supine;5 reps General Exercises - Upper Extremity Wrist Flexion: AROM;Left;10 reps;Supine Wrist Extension: AROM;Left;10 reps;Supine Digit Composite Flexion: AROM;10 reps;Supine Composite Extension: AROM;10 reps;Supine    PT Goals Acute Rehab PT Goals PT Goal: Supine/Side to Sit - Progress: Progressing toward goal PT Goal: Sit to Stand - Progress: Progressing toward goal PT Goal: Stand to Sit - Progress: Progressing toward goal PT Transfer Goal: Bed to Chair/Chair to Bed - Progress: Progressing toward goal PT Goal: Ambulate - Progress: Progressing toward goal  Visit Information  Last PT Received On: 08/18/12 Assistance Needed: +2    Subjective Data  Subjective: "I know I have to do this."   Cognition  Overall Cognitive Status: Appears within functional  limits for  tasks assessed/performed Arousal/Alertness: Awake/alert Orientation Level: Appears intact for tasks assessed Behavior During Session: [facility] for tasks performed    Balance  Static Sitting Balance Static Sitting - Balance Support: Right upper extremity supported;Feet supported Static Sitting - Level of Assistance: 5: Stand by assistance Static Sitting - Comment/# of Minutes: 5 mins EOB  End of Session PT - End of Session Equipment Utilized During Treatment: Gait belt;Oxygen Activity Tolerance: Patient limited by fatigue Patient left: in chair;with call bell/phone within reach;with family/visitor present Nurse Communication: Mobility status       INGOLD,Hasaan Radde 08/18/2012, 12:13 PM  [facility] [phone] (pager)

## 2012-08-18 NOTE — Progress Notes (Signed)
2 Days Post-Op  Subjective: Tired.  Neck soreness.  Complains of dry mouth and nose.  Objective: Vital signs in last 24 hours: Temp:  [97.9 F (36.6 C)-99.7 F (37.6 C)] 99.7 F (37.6 C) (10/19 0800) Pulse Rate:  [76-96] 85  (10/19 0800) Resp:  [14-25] 15  (10/19 0800) BP: (131-156)/(42-91) 131/42 mmHg (10/19 0800) SpO2:  [93 %-97 %] 97 % (10/19 0800) Weight:  [74 kg (163 lb 2.3 oz)] 74 kg (163 lb 2.3 oz) (10/19 0445) Last BM Date: 08/11/12 (Pt states it has been a week)  Intake/Output from previous day: 10/18 0701 - 10/19 0700 In: 1917.5 [I.V.:140; IV Piggyback:87.5; TPN:1690] Out: 3100 [Urine:3000; Drains:100] Intake/Output this shift: Total I/O In: -  Out: 150 [Urine:150]  General appearance: alert, cooperative, no distress and fatigued Neck: Right neck dressing changed.  Copious purulent drainage.  Penrose drains in place.  Lab Results:   Basename 08/18/12 0500 08/17/12 1700 08/17/12 0450  WBC 9.9 -- 10.2  HGB 9.3* 8.9* --  HCT 29.9* 33.8* --  PLT 274 -- 234   BMET  Basename 08/17/12 0450 08/16/12 0500  NA 142 142  K 3.9 3.5  CL 100 98  CO2 38* 41*  GLUCOSE 158* 152*  BUN 37* 37*  CREATININE 0.87 0.88  CALCIUM 9.2 9.6   PT/INR No results found for this basename: LABPROT:2,INR:2 in the last 72 hours ABG No results found for this basename: PHART:2,PCO2:2,PO2:2,HCO3:2 in the last 72 hours  Studies/Results: Dg Chest Port 1 View  08/17/2012  *RADIOLOGY REPORT*  Clinical Data: Chest pain, shortness of breath, pulmonary edema  PORTABLE CHEST - 1 VIEW  Comparison: 08/12/2012; 08/11/2012; chest CT of 08/10/2012  Findings:  Grossly unchanged cardiac silhouette and mediastinal contours post median sternotomy and CABG.  Stable positioning of support apparatus.  The lungs remain hyperinflated.  Mild point is congestion without frank evidence of edema.  Unchanged small bilateral effusions and bibasilar opacities.  No new focal airspace opacities.  A Penrose drain  overlies the right thoracic inlet. Unchanged bones.  IMPRESSION: 1.  Grossly unchanged small bilateral effusions and bibasilar opacities, atelectasis versus infiltrate. 2.  Pulmonary venous congestion without frank evidence of edema.   Original Report Authenticated By: Rachel Moulds, M.D.     Anti-infectives: Anti-infectives     Start     Dose/Rate Route Frequency Ordered Stop   08/10/12 1830   piperacillin-tazobactam (ZOSYN) IVPB 3.375 g        3.375 g 12.5 mL/hr over 240 Minutes Intravenous 3 times per day 08/10/12 1730            Assessment/Plan: s/p I&D of right neck deep space abscess, esophageal perforation  Continues to drain copiously through right neck wound.  No plan to start moving drains out until drainage decreases.  LOS: 11 days    Carmen Tolliver 08/18/2012

## 2012-08-18 NOTE — Progress Notes (Addendum)
PARENTERAL NUTRITION CONSULT NOTE - FOLLOW UP  Pharmacy Consult for TPN Indication: Esophageal perforation, s/p esophagoscopy and I&D  Allergies  Allergen Reactions  . Hydrocodone-Acetaminophen Itching    Tolerates tylenol    Patient Measurements: Height: 5\' 5"  (165.1 cm) Weight: 163 lb 2.3 oz (74 kg) IBW/kg (Calculated) : 57  Adjusted Body Weight: 65 Kg  Vital Signs: Temp: 98.2 F (36.8 C) (10/19 0445) Temp src: Oral (10/19 0445) BP: 131/45 mmHg (10/19 0445) Pulse Rate: 93  (10/19 0445) Intake/Output from previous day: 10/18 0701 - 10/19 0700 In: 1917.5 [I.V.:140; IV Piggyback:87.5; TPN:1690] Out: 3100 [Urine:3000; Drains:100] Intake/Output from this shift:    Labs:  Basename 08/18/12 0500 08/17/12 1700 08/17/12 0450 08/16/12 0500  WBC 9.9 -- 10.2 8.2  HGB 9.3* 8.9* 9.5* --  HCT 29.9* 33.8* 29.5* --  PLT 274 -- 234 216  APTT -- -- -- --  INR -- -- -- --     Basename 08/17/12 0450 08/16/12 0500  NA 142 142  K 3.9 3.5  CL 100 98  CO2 38* 41*  GLUCOSE 158* 152*  BUN 37* 37*  CREATININE 0.87 0.88  LABCREA -- --  CREAT24HRUR -- --  CALCIUM 9.2 9.6  MG 1.8 1.9  PHOS 3.4 3.1  PROT -- 5.7*  ALBUMIN -- 2.1*  AST -- 36  ALT -- 28  ALKPHOS -- 256*  BILITOT -- 1.0  BILIDIR -- --  IBILI -- --  PREALBUMIN -- --  TRIG -- --  CHOLHDL -- --  CHOL -- --   Estimated Creatinine Clearance: 45 ml/min (by C-G formula based on Cr of 0.87).    Basename 08/18/12 0442 08/18/12 0009 08/17/12 2005  GLUCAP 195* 143* 164*    Insulin Requirements in the past 24 hours:  5 units SSI  Current Nutrition:  TPN  Nutritional Goals:  1550-1850 kCal, 77-90 grams of protein per day per RD assessment  Assessment:  Patient presented to [facility] s/p fall and fractures needing repair, she was transferred here for hip and wrist fracture repair. S/p fracture repair she complained of odynophagia and underwent upper endoscopy per GI, which showed a contained esophageal perforation. She  is s/p Incision and drainage, right neck and mediastinum.   GI: Currently NPO, plans to keep this way for now to allow healing of esophageal tear, plans for gastrograffin swallow in a few days. Noted patient has been without significant nutrients since 10/8 until TPN started 10/12. Ms. Mogel is POD#2 s/p open GT placement. Pt ordered TF but nothing is charted. RN states that patients TF are currently going at 46ml/hr and she is tolerating without any residuals. Endo: No hx DM, A1c 5.5% at BL, CBG slightly elevated (range 143-195) - SSI need has remained stable. Lytes: Mag supplemented yesterday, no lytes ordered for today Renal: Scr stable at 0.88 as of 10/18, UOP 1.7 - daily lasix stopped Pulm: 96% 3L [place]  Cards: VSS - hydralazine, IV metoprolol stopped (was on toprol PTA) Hepatobil: LFTs except alk phos is slightly elevated (trending up), cholesterol and triglycerides are WNL as of 10/13, prealbumin low at 4.3 as of 10/13. Suspect may see slightly low prealbumin d/t ongoing inflammation and post-op status. ID: [id number]#[id number] empirically for esophageal perf. WBC wnl, afebrile.  Best Practices: SCDs, PPI IV TPN Access: Triple lumen PICC  TPN day#: 8  Plan:  - F/u later this AM to see if patient is still tolerating TF, as may be able to discontinue or at least reduce TPN rate as  patient will be at 48ml/hr this afternoon - If TPN needs to continue tonight, will continue Clinimix E 5/20 at 65cc/hr (Provides goal protein of 78gm/day and average est Kcal of 1552 Kcal based on three times a week lipid supplementation). Will continue this until TF are at > or = 60% of goals. - Will supplement MVI, trace elements and IV fats on MWF only d/t ongoing national shortage) - f/u TF progress  Salome Arnt, PharmD, BCPS Pager # [phone] 08/18/2012 8:08 AM  Addendum: Pt is tolerating her tube feeds and her rate will be >40 this afternoon. RD recommended stopping TPN when TF rate was >52ml/hr. Per discussion with  MD, will DC TPN after current bag completes.   Plan: 1. At 1700 decrease TPN rate to 60ml/hr then DC at 1800.  2. DC all TPN labs 3. Will not DC SSI at this time as she will likely still need it with her TF. Consider DC if pt does not require when TF are at goal.  Pharmacy will sign-off TPN management at this time. Please re-consult [place] is anything else is needed. Thank you!  Salome Arnt, PharmD, BCPS Pager # [phone] 08/18/2012 12:48 PM

## 2012-08-18 NOTE — Evaluation (Signed)
Occupational Therapy Evaluation Patient Details Name: Amy [name] MRN: [id number] DOB: [birth year] Today's Date: 08/18/2012 Time: [id number] OT Time Calculation (min): 35 min  OT Assessment / Plan / Recommendation Clinical Impression       OT Assessment       Follow Up Recommendations  Skilled nursing facility    Barriers to Discharge      Equipment Recommendations  None recommended by OT;None recommended by PT    Recommendations for Other Services    Frequency  Min 2X/week    Precautions / Restrictions Precautions Precautions: Fall;Posterior Hip Precaution Comments: OK to WB through [place] with splint in place.  Okay to start AROM wrist and Digits Lt.    Pertinent Vitals/Pain Pt reports pain in 2nd digit on LUE from 02 sensor; RN made aware.     ADL  ADL Comments: Pt reports no pain with splint. However, did report pain and redness at DIP joint on 2nd digit where O2 monitor is located. RN informed and 02 switched to 3rd digit. Pt able to don/doff splint with supervision.     OT Diagnosis:    OT Problem List:   OT Treatment Interventions:     OT Goals ADL Goals ADL Goal: Additional Goal #1 - Progress: Progressing toward goals Arm Goals Arm Goal: AROM - Progress: Progressing toward goal Arm Goal: Additional Goal #1 - Progress: Progressing toward goals Miscellaneous OT Goals OT Goal: Miscellaneous Goal #1 - Progress: Progressing toward goals OT Goal: Miscellaneous Goal #2 - Progress: Progressing toward goals  Visit Information  Last OT Received On: 08/18/12    Subjective Data      Prior Functioning               Vision/Perception     Cognition  Overall Cognitive Status: Appears within functional limits for tasks assessed/performed Arousal/Alertness: Awake/alert Orientation Level: Appears intact for tasks assessed Behavior During Session: [facility] for tasks performed    Extremity/Trunk Assessment       Mobility Bed Mobility Rolling Right: 2:  Max assist Rolling Left: 3: Mod assist;With rail Details for Bed Mobility Assistance: VC for RUE and RLE placement for rolling left. pt rolled left/right to be placed on/off bed pan     Shoulder Instructions     Exercise General Exercises - Upper Extremity Wrist Flexion: AROM;Left;10 reps;Supine Wrist Extension: AROM;Left;10 reps;Supine Digit Composite Flexion: AROM;10 reps;Supine Composite Extension: AROM;10 reps;Supine   Balance     End of Session OT - End of Session Activity Tolerance: Patient tolerated treatment well Patient left: in bed;with call bell/phone within reach;with family/visitor present Nurse Communication: Mobility status  GO     Amy [name] 08/18/2012, 9:51 AM

## 2012-08-19 LAB — GLUCOSE, CAPILLARY
Glucose-Capillary: 103 mg/dL — ABNORMAL HIGH (ref 70–99)
Glucose-Capillary: 125 mg/dL — ABNORMAL HIGH (ref 70–99)
Glucose-Capillary: 127 mg/dL — ABNORMAL HIGH (ref 70–99)

## 2012-08-19 LAB — CBC
HCT: 29.6 % — ABNORMAL LOW (ref 36.0–46.0)
Hemoglobin: 9.3 g/dL — ABNORMAL LOW (ref 12.0–15.0)
MCH: 28.7 pg (ref 26.0–34.0)
MCHC: 31.4 g/dL (ref 30.0–36.0)
RDW: 14.7 % (ref 11.5–15.5)

## 2012-08-19 MED ORDER — LACTULOSE 10 GM/15ML PO SOLN
30.0000 g | Freq: Two times a day (BID) | ORAL | Status: DC | PRN
  Administered 2012-08-19: 30 g
  Administered 2012-08-21: 20 g
  Filled 2012-08-19: qty 45

## 2012-08-19 MED ORDER — QUETIAPINE 12.5 MG HALF TABLET
12.5000 mg | ORAL_TABLET | Freq: Every day | ORAL | Status: DC
  Administered 2012-08-19 – 2012-08-22 (×4): 12.5 mg
  Filled 2012-08-19 (×7): qty 1

## 2012-08-19 MED ORDER — SODIUM CHLORIDE 0.9 % IV SOLN
750.0000 mg | Freq: Two times a day (BID) | INTRAVENOUS | Status: DC
  Administered 2012-08-19 – 2012-08-20 (×2): 750 mg via INTRAVENOUS
  Filled 2012-08-19 (×3): qty 750

## 2012-08-19 MED ORDER — BARRIER CREAM NON-SPECIFIED
1.0000 "application " | TOPICAL_CREAM | Freq: Two times a day (BID) | TOPICAL | Status: DC | PRN
  Filled 2012-08-19: qty 1

## 2012-08-19 MED ORDER — FUROSEMIDE 10 MG/ML IJ SOLN
40.0000 mg | Freq: Every day | INTRAMUSCULAR | Status: DC
  Administered 2012-08-19 – 2012-08-20 (×2): 40 mg via INTRAVENOUS
  Filled 2012-08-19 (×2): qty 4

## 2012-08-19 MED ORDER — ACETAMINOPHEN 160 MG/5ML PO SOLN
650.0000 mg | Freq: Four times a day (QID) | ORAL | Status: DC | PRN
  Administered 2012-08-19 – 2012-08-21 (×2): 650 mg
  Filled 2012-08-19 (×3): qty 20.3

## 2012-08-19 NOTE — Progress Notes (Signed)
Dr. Donnal Debar  made aware of temps 99.1- 100.3 axillary. RR 32-36 Sats 94-97 %. New orders pending.

## 2012-08-19 NOTE — Progress Notes (Addendum)
ANTIBIOTIC CONSULT NOTE - FOLLOW UP  Pharmacy Consult for Zosyn Indication: Empiric coverage in setting of esophageal perforation + mediastinitis  Allergies  Allergen Reactions  . Hydrocodone-Acetaminophen Itching    Tolerates tylenol    Patient Measurements: Height: 5\' 5"  (165.1 cm) Weight: 167 lb 8.8 oz (76 kg) IBW/kg (Calculated) : 57   Vital Signs: Temp: 99.8 F (37.7 C) (10/20 0550) Temp src: Oral (10/20 0550) BP: 139/57 mmHg (10/20 0550) Pulse Rate: 100  (10/20 0650) Intake/Output from previous day: 10/19 0701 - 10/20 0700 In: 2399.5 [I.V.:10; IV Piggyback:134.5; TPN:1015] Out: 1850 [Urine:1850] Intake/Output from this shift:    Labs:  Basename 08/19/12 0430 08/18/12 0500 08/17/12 1700 08/17/12 0450  WBC 11.2* 9.9 -- 10.2  HGB 9.3* 9.3* 8.9* --  PLT 302 274 -- 234  LABCREA -- -- -- --  CREATININE -- -- -- 0.87   Estimated Creatinine Clearance: 45.6 ml/min (by C-G formula based on Cr of 0.87). No results found for this basename: VANCOTROUGH:2,VANCOPEAK:2,VANCORANDOM:2,GENTTROUGH:2,GENTPEAK:2,GENTRANDOM:2,TOBRATROUGH:2,TOBRAPEAK:2,TOBRARND:2,AMIKACINPEAK:2,AMIKACINTROU:2,AMIKACIN:2, in the last 72 hours   Microbiology: Recent Results (from the past 720 hour(s))  URINE CULTURE     Status: Normal   Collection Time   08/07/12 11:24 AM      Component Value Range Status Comment   Specimen Description URINE, CATHETERIZED   Final    Special Requests NONE   Final    Culture  Setup Time 08/07/2012 16:50   Final    Colony Count NO GROWTH   Final    Culture NO GROWTH   Final    Report Status 08/08/2012 FINAL   Final   SURGICAL PCR SCREEN     Status: Normal   Collection Time   08/08/12  5:32 AM      Component Value Range Status Comment   MRSA, PCR NEGATIVE  NEGATIVE Final    Staphylococcus aureus NEGATIVE  NEGATIVE Final   WOUND CULTURE     Status: Normal   Collection Time   08/10/12 10:29 PM      Component Value Range Status Comment   Specimen Description WOUND  ESOPHAGUS   Final    Special Requests POF ZOSYN   Final    Gram Stain     Final    Value: FEW WBC PRESENT, PREDOMINANTLY PMN     RARE SQUAMOUS EPITHELIAL CELLS PRESENT     ABUNDANT GRAM POSITIVE RODS     FEW GRAM NEGATIVE RODS   Culture     Final    Value: MULTIPLE ORGANISMS PRESENT, NONE PREDOMINANT NO STAPHYLOCOCCUS AUREUS ISOLATED NO GROUP A STREP (S.PYOGENES) ISOLATED   Report Status 08/14/2012 FINAL   Final   ANAEROBIC CULTURE     Status: Normal   Collection Time   08/10/12 10:29 PM      Component Value Range Status Comment   Specimen Description WOUND ESOPHAGUS   Final    Special Requests PATIENT ON FOLLOWING ZOSYN   Final    Gram Stain     Final    Value: FEW WBC PRESENT, PREDOMINANTLY PMN     RARE SQUAMOUS EPITHELIAL CELLS PRESENT     ABUNDANT GRAM POSITIVE RODS     FEW GRAM NEGATIVE RODS   Culture NO ANAEROBES ISOLATED   Final    Report Status 08/15/2012 FINAL   Final     Anti-infectives     Start     Dose/Rate Route Frequency Ordered Stop   08/10/12 1830   piperacillin-tazobactam (ZOSYN) IVPB 3.375 g  3.375 g 12.5 mL/hr over 240 Minutes Intravenous 3 times per day 08/10/12 1730            Assessment: 78 yo F found to have esophageal perforation on upper endoscopy this admission along with mediastinitis, now s/p I&D. Cultures have reported no significant growth.   Today with low grade fever and noted infil vs/ atx on CXR, MD broadened abx coverage to include vancomycin.  Pharmacy consulted for dosing.  Scr has been stable with CrCl ~45 ml/min and UOP is good at 1 ml/kg/hr (on lasix).  WBC WNL.  10/20 Vanc>> 10/11 Zosyn>>  10/11 Esophagus WCx: multiple organisms 10/9 MRSA PCR neg 10/8 UCx >> neg  Goal of Therapy: Vancomycin trough 15-20 mcg/mL  Plan:  - Initiate Vancomycin 750 mg IV q12h - Continue Zosyn 3.375g IV every 8 hours - Will continue to follow renal function, LOT, and antibiotic de-escalation plans  - Consider trough at [facility] due to patients  age  Bryson Ha L. Amada Jupiter, PharmD, [facility] Clinical Pharmacist Pager: [phone] Pharmacy: [phone] 08/19/2012 8:02 AM

## 2012-08-19 NOTE — Progress Notes (Signed)
[facility] Progress Note  TEAM 1 - Stepdown/ICU TEAM   [place] M Nienaber [id number] DOB: 08/18/1922 DOA: 08/07/2012 PCP: Purvis Kilts, MD  Brief narrative: 78 year old female who lives alone. She experienced a mechanical fall going up the steps at home. This was not associated with loss of consciousness, prefall chest pain palpitations or lightheadedness. Despite this the daughter reports patient had been having suboptimal blood pressures with systolics in the [id number] - the patient had been complaining of some nonspecific dizziness. The patient was transported to the hospital via EMS and she was found to be in atrial fibrillation with RVR with a ventricular response of 120. She subsequently converted back to sinus rhythm. She was initially treated at [facility]. After arrival she was found to have an impacted fracture of left hip as well as a left radial head fracture. Because of her underlying multiple medical problems including coronary artery disease and chronic kidney disease she was subsequently transported to [facility] for further monitoring and treatment. She eventually underwent surgical treatment for her fractures. Because of complaints of odynophagia a swallowing evaluation was obtained which showed no evidence of oral pharyngeal dysphagia - she continued with odynophagia so a gastroenterology consult was obtained. She subsequently underwent EGD which revealed a mucosal defect in the cervical esophagus that was either a ulceration versus a perforation. She eventually underwent an esophagram with Gastrografin that confirmed a contained perforation. Cardiothoracic surgery was consulted. They felt that the etiology to the small perforation was related to a traumatic injury that was related to intubation at time of her initial orthopedic surgery on October 9. An ENT consult was obtained as well as a CT scan of the neck and chest to rule out any significant fluid collections.  The CT did reveal extensive retropharyngeal gas dissecting upwards from the mediastinum with an associated focal perforation of the esophagus at the level of the cricoid cartilage with oral contrast extravasating into the retro-pharyngeal spacing communicating with the superior mediastinum. Subsequently the patient underwent incision and drainage of this area by ENT surgeon. Currently she is stable on parenteral nutrition in the step down unit. Team 1 assumed care of this patient on 08/14/2012.  Impression and Plan:  Fracture of left hip/radial head after fall at home: A) Status post hip hemiarthroplasty left hip by Dr Ronnie Derby 08/08/2012 B) s/p ORIF by Dr Burney Gauze 08/08/2012 Management per orthopedic team - continue OT and PT   Traumatic peri-op esophageal perforation - Mediastinitis  s/p Incision and drainage, right neck and mediastinum, transcervical approach - appreciate Cardiothoracic and ENT assistance - s/p gastrostomy tube on 08/16/2012 by General Surgery - continue empiric IV Zosyn  Acute respiratory failure with hypoxia due to Pulmonary edema, noncardiac Suffered repeat episode last PM marked by tachypnea, HTN, and hypoxia - responded well to IV lasix - resume scheduled lasix due to overall + fluid balance for hospital stay - high risk for aspiration of secretions - w/ low grade fever and noted infil vs/ atx on CXR will broaden abx coverage to include vanc   Acute blood loss anemia with macrocytosis Status post transfusion of packed red blood cells this admission - Hemoglobin nadir 6.1 - likely etiology due to multiple recent surgical procedures - B12 sub optimal at 299 - have begun B12 repletion  Hypertension BP trend improved w/ hydralazine  Protein calorie malnutrition/hyperglycemia status post open gastrostomy tube 08/16/2012 TNA now stopped - PEG tube dependent due to strict NPO status   CAD, CABG X 3  10/09. Myoview low risk 10/10. EF >55% 2D 6/13 Received preoperative cardiac  clearance and has remained asymptomatic since admission  Chronic renal insufficiency, stage III (moderate), SCr 1.4 Sept 2013 Renal function remains stable at usual baseline  PAF/SSS history of Pacemaker March 2010 (MDT) stable with sinus rhythm and sinus tachycardia  Hyperlipidemia, statin intol. NPO  Aortic valve sclerosis, no stenosis  Carotid bruit present bilat, dopplers OK 6/13 Neurologically stable postop  History of UTI - recent completion 10 days Cipro Urinalysis at presentation negative for infectious process   Hx of dizziness Initial fall reported as mechanical so no indication to pursue syncope work - dizziness could be rate issue in setting of afib  DVT after previous hip surgery SCDs  DVT prophylaxis: SCDs Code Status: DO NOT RESUSCITATE Family Communication: Spoke with patient's daughter  Disposition Plan: Remain in stepdown  Consultants: Orthopedics Cardiothoracic Gastroenterology ENT Gen Surgery  Procedures: ORIF distal left radial fracture with carpal tunnel release by Dr. Burney Gauze and left arthroplasty bipolar hip on 08/08/2012 by Dr. Ronnie Derby  EGD 08/10/2012 by Dr. Benson Norway  Incision and drainage of right neck and mediastinum with a transcervical approach as well as cervical esophagoscopy on [id number] by Dr. Erik Obey  Status post open gastrostomy tube placement with #24 Mal[place]ot tube by Dr. Grandville Silos 08/16/2012  Antibiotics: Zosyn 10/11 >>> Vancomycin 10/20>>  HPI/Subjective: Pt c/o pain in abdom as well as burning pain in her feet.  She is not sleeping well at night, and is very somnolent at this time.  Her daughter and son are at the bedside.  Objective: Blood pressure 119/49, pulse 100, temperature 98 F (36.7 C), temperature source Oral, resp. rate 23, height 5\' 5"  (1.651 m), weight 76 kg (167 lb 8.8 oz), SpO2 95.00%.  Intake/Output Summary (Last 24 hours) at 08/19/12 1009 Last data filed at 08/19/12 0700  Gross per 24 hour  Intake 2309.5 ml    Output   1700 ml  Net  609.5 ml     Exam: General: no acute resp distress at this time  ENT: Neck incisions covered by dressing which is clean dry and intact Lungs: poor air movement in bases B - no wheeze  Cardiovascular: RRR - no gallup or rub  Abdomen: Mildly tender over her abdominal surgical incision, nondistended, soft, bowel sounds positive, no rebound, no ascites, no appreciable mass, abdominal dressing is clean dry and intact Musculoskeletal: No significant cyanosis or clubbing of bilateral lower extremities - no erythema or calor of B feet - 2+ DP pulsed B LE Neurological: lethargic but otherwise appropriate. No appreciable neurological deficit  Data Reviewed: Basic Metabolic Panel:  Lab 0000000 0450 08/16/12 0500 08/15/12 0505 08/14/12 0550 08/13/12 0400  NA 142 142 144 143 142  K 3.9 3.5 3.5 3.7 3.9  CL 100 98 103 105 105  CO2 38* 41* 38* 32 30  GLUCOSE 158* 152* 159* 155* 191*  BUN 37* 37* 38* 33* 32*  CREATININE 0.87 0.88 0.90 0.83 0.96  CALCIUM 9.2 9.6 9.5 9.6 9.0  MG 1.8 1.9 1.7 1.9 1.9  PHOS 3.4 3.1 3.0 2.2* 2.7   Liver Function Tests:  Lab 08/16/12 0500 08/13/12 0400  AST 36 33  ALT 28 14  ALKPHOS 256* 221*  BILITOT 1.0 1.0  PROT 5.7* 5.1*  ALBUMIN 2.1* 1.8*   CBC:  Lab 08/19/12 0430 08/18/12 0500 08/17/12 1700 08/17/12 0450 08/16/12 0500 08/14/12 0444 08/13/12 0400  WBC 11.2* 9.9 -- 10.2 8.2 7.2 --  NEUTROABS -- -- -- -- -- --  6.8  HGB 9.3* 9.3* 8.9* 9.5* 11.4* -- --  HCT 29.6* 29.9* 33.8* 29.5* 35.5* -- --  MCV 91.4 92.3 -- 91.9 91.3 117.4* --  PLT 302 274 -- 234 216 171 --   CBG:  Lab 08/19/12 0807 08/19/12 0432 08/18/12 2338 08/18/12 2002 08/18/12 1742  GLUCAP 125* 103* 165* 209* 172*    Recent Results (from the past 240 hour(s))  WOUND CULTURE     Status: Normal   Collection Time   08/10/12 10:29 PM      Component Value Range Status Comment   Specimen Description WOUND ESOPHAGUS   Final    Special Requests POF ZOSYN   Final     Gram Stain     Final    Value: FEW WBC PRESENT, PREDOMINANTLY PMN     RARE SQUAMOUS EPITHELIAL CELLS PRESENT     ABUNDANT GRAM POSITIVE RODS     FEW GRAM NEGATIVE RODS   Culture     Final    Value: MULTIPLE ORGANISMS PRESENT, NONE PREDOMINANT NO STAPHYLOCOCCUS AUREUS ISOLATED NO GROUP A STREP (S.PYOGENES) ISOLATED   Report Status 08/14/2012 FINAL   Final   ANAEROBIC CULTURE     Status: Normal   Collection Time   08/10/12 10:29 PM      Component Value Range Status Comment   Specimen Description WOUND ESOPHAGUS   Final    Special Requests PATIENT ON FOLLOWING ZOSYN   Final    Gram Stain     Final    Value: FEW WBC PRESENT, PREDOMINANTLY PMN     RARE SQUAMOUS EPITHELIAL CELLS PRESENT     ABUNDANT GRAM POSITIVE RODS     FEW GRAM NEGATIVE RODS   Culture NO ANAEROBES ISOLATED   Final    Report Status 08/15/2012 FINAL   Final      Studies:  Recent x-ray studies have been reviewed in detail by the Attending Physician  Scheduled Meds:  Reviewed in detail by the Attending Physician   Cherene Altes, MD [facility] Office  [phone] Pager [phone]  On-Call/Text Page:      [email or web address]      password [facility]  08/19/2012, 10:09 AM   LOS: 12 days

## 2012-08-19 NOTE — Progress Notes (Signed)
Physical Therapy Treatment Patient Details Name: [place] M Korpi MRN: [id number] DOB: September 26, 1922 Today's Date: 08/19/2012 Time: [id number] PT Time Calculation (min): 29 min  PT Assessment / Plan / Recommendation Comments on Treatment Session  Patient making slow improvements with mobility.    Follow Up Recommendations  Post acute inpatient     Does the patient have the potential to tolerate intense rehabilitation  No, Recommend LTACH  Barriers to Discharge        Equipment Recommendations  None recommended by PT    Recommendations for Other Services    Frequency Min 4X/week   Plan Discharge plan remains appropriate;Frequency remains appropriate    Precautions / Restrictions Precautions Precautions: Posterior Hip;Fall Precaution Comments: Patient able to recall 2/3 hip precautions. Reviewed with her all 3. Required Braces or Orthoses: Other Brace/Splint Other Brace/Splint: Lt. wrist splint Restrictions Weight Bearing Restrictions: Yes LUE Weight Bearing: Weight bearing as tolerated LLE Weight Bearing: Weight bearing as tolerated   Pertinent Vitals/Pain     Mobility  Bed Mobility Bed Mobility: Supine to Sit;Sitting - Scoot to Edge of Bed Supine to Sit: 1: +2 Total assist;HOB flat;With rails Supine to Sit: Patient Percentage: 40% Sitting - Scoot to Edge of Bed: 3: Mod assist Details for Bed Mobility Assistance: Verbal cues for technique. Transfers Transfers: Sit to Stand;Stand to Sit Sit to Stand: 1: +2 Total assist;With upper extremity assist;From bed Sit to Stand: Patient Percentage: 60% Stand to Sit: 1: +2 Total assist;With upper extremity assist;With armrests;To chair/3-in-1 Stand to Sit: Patient Percentage: 60% Details for Transfer Assistance: Verbal cues for hand placement. Assist with platform strap needed.  Verbal cues for placement of LLE during transitions. Ambulation/Gait Ambulation/Gait Assistance: 1: +2 Total assist Ambulation/Gait: Patient Percentage:  70% Ambulation Distance (Feet): 15 Feet Assistive device: Left platform walker;Rolling walker Ambulation/Gait Assistance Details: Verbal cues for safe use of walker, and for gait sequence.  Cues to step out to side with LLE (stepping midline and toward right side). Gait Pattern: Step-to pattern;Decreased stance time - left;Decreased step length - right;Trunk flexed Gait velocity: decreased     PT Goals Acute Rehab PT Goals PT Goal: Supine/Side to Sit - Progress: Progressing toward goal PT Goal: Sit to Supine/Side - Progress: Progressing toward goal PT Goal: Sit to Stand - Progress: Progressing toward goal PT Goal: Stand to Sit - Progress: Progressing toward goal PT Transfer Goal: Bed to Chair/Chair to Bed - Progress: Progressing toward goal PT Goal: Ambulate - Progress: Progressing toward goal  Visit Information  Last PT Received On: 08/19/12 Assistance Needed: +2    Subjective Data  Subjective: Patient pleased that she did better today with gait.   Cognition  Overall Cognitive Status: Appears within functional limits for tasks assessed/performed Arousal/Alertness: Awake/alert Orientation Level: Appears intact for tasks assessed Behavior During Session: [facility] for tasks performed    Balance     End of Session PT - End of Session Equipment Utilized During Treatment: Gait belt Activity Tolerance: Patient limited by fatigue Patient left: in chair;with call bell/phone within reach;with family/visitor present Nurse Communication: Mobility status   GP     Despina Pole 08/19/2012, 2:43 PM Carita Pian. Sanjuana Kava, [place] Pager [phone]

## 2012-08-20 ENCOUNTER — Inpatient Hospital Stay (HOSPITAL_COMMUNITY): Payer: Medicare Other

## 2012-08-20 LAB — CBC
HCT: 29.4 % — ABNORMAL LOW (ref 36.0–46.0)
MCH: 28.7 pg (ref 26.0–34.0)
MCHC: 31.3 g/dL (ref 30.0–36.0)
RDW: 14.8 % (ref 11.5–15.5)

## 2012-08-20 LAB — URINALYSIS, ROUTINE W REFLEX MICROSCOPIC
Glucose, UA: NEGATIVE mg/dL
Hgb urine dipstick: NEGATIVE
Leukocytes, UA: NEGATIVE
Protein, ur: NEGATIVE mg/dL
Specific Gravity, Urine: 1.016 (ref 1.005–1.030)
pH: 8 (ref 5.0–8.0)

## 2012-08-20 LAB — DIFFERENTIAL
Basophils Absolute: 0 10*3/uL (ref 0.0–0.1)
Basophils Relative: 0 % (ref 0–1)
Eosinophils Relative: 2 % (ref 0–5)
Lymphocytes Relative: 8 % — ABNORMAL LOW (ref 12–46)
Monocytes Absolute: 0.9 10*3/uL (ref 0.1–1.0)
Monocytes Relative: 8 % (ref 3–12)

## 2012-08-20 LAB — BASIC METABOLIC PANEL
BUN: 47 mg/dL — ABNORMAL HIGH (ref 6–23)
Calcium: 9.1 mg/dL (ref 8.4–10.5)
GFR calc Af Amer: 45 mL/min — ABNORMAL LOW (ref >90)
GFR calc non Af Amer: 39 mL/min — ABNORMAL LOW (ref >90)
Potassium: 3.7 mEq/L (ref 3.5–5.1)

## 2012-08-20 LAB — GLUCOSE, CAPILLARY
Glucose-Capillary: 105 mg/dL — ABNORMAL HIGH (ref 70–99)
Glucose-Capillary: 102 mg/dL — ABNORMAL HIGH (ref 70–99)
Glucose-Capillary: 146 mg/dL — ABNORMAL HIGH (ref 70–99)
Glucose-Capillary: 108 mg/dL — ABNORMAL HIGH (ref 70–99)

## 2012-08-20 MED ORDER — IOHEXOL 300 MG/ML  SOLN
150.0000 mL | Freq: Once | INTRAMUSCULAR | Status: AC | PRN
  Administered 2012-08-20: 10 mL via ORAL

## 2012-08-20 MED ORDER — VANCOMYCIN HCL IN DEXTROSE 1-5 GM/200ML-% IV SOLN
1000.0000 mg | INTRAVENOUS | Status: DC
  Administered 2012-08-20 – 2012-08-22 (×3): 1000 mg via INTRAVENOUS
  Filled 2012-08-20 (×4): qty 200

## 2012-08-20 MED ORDER — PANTOPRAZOLE SODIUM 40 MG PO PACK
40.0000 mg | PACK | Freq: Every day | ORAL | Status: DC
  Administered 2012-08-21 – 2012-08-23 (×3): 40 mg
  Filled 2012-08-20 (×4): qty 20

## 2012-08-20 MED ORDER — JEVITY 1.2 CAL PO LIQD
1000.0000 mL | ORAL | Status: DC
  Filled 2012-08-20 (×6): qty 1000

## 2012-08-20 NOTE — Progress Notes (Signed)
08/20/2012 9:36 AM  Amy [name], [place] [id number]  Post-Op Day 10    Temp:  [97.9 F (36.6 C)-99.2 F (37.3 C)] 98.8 F (37.1 C) (10/21 0824) Pulse Rate:  [83-104] 104  (10/21 0824) Resp:  [16-24] 17  (10/21 0824) BP: (103-147)/(34-57) 139/39 mmHg (10/21 0824) SpO2:  [92 %-100 %] 96 % (10/21 0824) Weight:  [75.5 kg (166 lb 7.2 oz)] 75.5 kg (166 lb 7.2 oz) (10/21 0700),     Intake/Output Summary (Last 24 hours) at 08/20/12 0936 Last data filed at 08/20/12 0610  Gross per 24 hour  Intake 1626.5 ml  Output   2100 ml  Net -473.5 ml    Results for orders placed during the hospital encounter of 08/07/12 (from the past 24 hour(s))  GLUCOSE, CAPILLARY     Status: Abnormal   Collection Time   08/19/12 12:39 PM      Component Value Range   Glucose-Capillary 127 (*) 70 - 99 mg/dL  GLUCOSE, CAPILLARY     Status: Abnormal   Collection Time   08/19/12  4:05 PM      Component Value Range   Glucose-Capillary 116 (*) 70 - 99 mg/dL  GLUCOSE, CAPILLARY     Status: Abnormal   Collection Time   08/19/12  7:28 PM      Component Value Range   Glucose-Capillary 147 (*) 70 - 99 mg/dL  GLUCOSE, CAPILLARY     Status: Abnormal   Collection Time   08/20/12 12:07 AM      Component Value Range   Glucose-Capillary 105 (*) 70 - 99 mg/dL  DIFFERENTIAL     Status: Abnormal   Collection Time   08/20/12  4:30 AM      Component Value Range   Neutrophils Relative 82 (*) 43 - 77 %   Neutro Abs 9.8 (*) 1.7 - 7.7 K/uL   Lymphocytes Relative 8 (*) 12 - 46 %   Lymphs Abs 1.0  0.7 - 4.0 K/uL   Monocytes Relative 8  3 - 12 %   Monocytes Absolute 0.9  0.1 - 1.0 K/uL   Eosinophils Relative 2  0 - 5 %   Eosinophils Absolute 0.3  0.0 - 0.7 K/uL   Basophils Relative 0  0 - 1 %   Basophils Absolute 0.0  0.0 - 0.1 K/uL  CBC     Status: Abnormal   Collection Time   08/20/12  4:30 AM      Component Value Range   WBC 12.1 (*) 4.0 - 10.5 K/uL   RBC 3.21 (*) 3.87 - 5.11 MIL/uL   Hemoglobin 9.2 (*) 12.0 - 15.0  g/dL   HCT 29.4 (*) 36.0 - 46.0 %   MCV 91.6  78.0 - 100.0 fL   MCH 28.7  26.0 - 34.0 pg   MCHC 31.3  30.0 - 36.0 g/dL   RDW 14.8  11.5 - 15.5 %   Platelets 312  150 - 400 K/uL  BASIC METABOLIC PANEL     Status: Abnormal   Collection Time   08/20/12  4:30 AM      Component Value Range   Sodium 139  135 - 145 mEq/L   Potassium 3.7  3.5 - 5.1 mEq/L   Chloride 97  96 - 112 mEq/L   CO2 39 (*) 19 - 32 mEq/L   Glucose, Bld 127 (*) 70 - 99 mg/dL   BUN 47 (*) 6 - 23 mg/dL   Creatinine, Ser 1.20 (*) 0.50 - 1.10 mg/dL  Calcium 9.1  8.4 - 10.5 mg/dL   GFR calc non Af Amer 39 (*) >90 mL/min   GFR calc Af Amer 45 (*) >90 mL/min  URIC ACID     Status: Normal   Collection Time   08/20/12  4:30 AM      Component Value Range   Uric Acid, Serum 3.3  2.4 - 7.0 mg/dL  GLUCOSE, CAPILLARY     Status: Abnormal   Collection Time   08/20/12  4:48 AM      Component Value Range   Glucose-Capillary 125 (*) 70 - 99 mg/dL  GLUCOSE, CAPILLARY     Status: Abnormal   Collection Time   08/20/12  8:21 AM      Component Value Range   Glucose-Capillary 119 (*) 70 - 99 mg/dL    SUBJECTIVE:  Feels sl stronger.  OBJECTIVE:  Still copious drainage.  Cannot tell whether this is all mucopus, or could have a component of refluxed TF.  IMPRESSION:  Slow progress.    PLAN:  When nutrition improved and wound showing signs of granulation, we may be able to start backing out the mediastinal drain, keep the retro-esophageal drain until a formed tract is established.  At that point, a pressure dressing may help.  If dyed TF refluxing, may consider for J-tube feeding.  Will ask Gen'l Surg for help.  [name]

## 2012-08-20 NOTE — Progress Notes (Addendum)
Nutrition Follow-up  Intervention:   1. Continue current enteral nutrition regimen. 2. Once pt without IVF, recommend free water flushes of 200 ml QID. Total free water flushes and TF free water will provide: 1865 ml water daily. 3. RD to continue to follow nutrition care plan  Assessment:   TPN discontinued on 10/19 with positive tolerance of enteral nutrition. Noted pt having copious drainage from her mediastinal drain. ENT recommends considering J-tube feeding if drainage is refluxed TF.  Pt is currently tolerating TF regimen well per RN, at goal. Noted that current RD does not suspect that drainage is 2/2 refluxed TF.  Diet Order:  NPO TF: Jevity 1.2 formula via PEG at 55 ml/hr. Provides: 1584 kcal, 74 grams protein, 1065 ml free water.   Meds: Scheduled Meds:    . bacitracin  1 application Topical BID  . chlorhexidine  5 mL Mouth/Throat QID  . cyanocobalamin  1,000 mcg Subcutaneous Q1200  . hydrALAZINE  10 mg Intravenous Q8H  . insulin aspart  0-9 Units Subcutaneous Q4H  . pantoprazole sodium  40 mg Per Tube Daily  . piperacillin-tazobactam (ZOSYN)  IV  3.375 g Intravenous Q8H  . QUEtiapine  12.5 mg Per Tube QHS  . sodium chloride  10-40 mL Intracatheter Q12H  . vancomycin  1,000 mg Intravenous Q24H  . DISCONTD: furosemide  40 mg Intravenous Daily  . DISCONTD: pantoprazole (PROTONIX) IV  40 mg Intravenous Q12H  . DISCONTD: vancomycin  750 mg Intravenous Q12H   Continuous Infusions:    . sodium chloride 35 mL/hr at 08/13/12 0740  . feeding supplement (JEVITY 1.2 CAL)    . DISCONTD: feeding supplement (JEVITY 1.2 CAL) 55 mL/hr at 08/19/12 0433   PRN Meds:.acetaminophen (TYLENOL) oral liquid 160 mg/5 mL, bacitracin, barrier cream, iohexol, lactulose, morphine injection, ondansetron (ZOFRAN) IV, ondansetron, oxyCODONE, sodium chloride  Labs:  CMP     Component Value Date/Time   NA 139 08/20/2012 0430   K 3.7 08/20/2012 0430   CL 97 08/20/2012 0430   CO2 39* 08/20/2012  0430   GLUCOSE 127* 08/20/2012 0430   BUN 47* 08/20/2012 0430   CREATININE 1.20* 08/20/2012 0430   CALCIUM 9.1 08/20/2012 0430   PROT 5.7* 08/16/2012 0500   ALBUMIN 2.1* 08/16/2012 0500   AST 36 08/16/2012 0500   ALT 28 08/16/2012 0500   ALKPHOS 256* 08/16/2012 0500   BILITOT 1.0 08/16/2012 0500   GFRNONAA 39* 08/20/2012 0430   GFRAA 45* 08/20/2012 0430    Phosphorus  Date Value Range Status  08/17/2012 3.4  2.3 - 4.6 mg/dL Final    Magnesium  Date Value Range Status  08/17/2012 1.8  1.5 - 2.5 mg/dL Final     Intake/Output Summary (Last 24 hours) at 08/20/12 1532 Last data filed at 08/20/12 1200  Gross per 24 hour  Intake 1107.5 ml  Output   2275 ml  Net -1167.5 ml  BM 10/12  CBG (last 3)   Basename 08/20/12 1228 08/20/12 0821 08/20/12 0448  GLUCAP 102* 119* 125*    Weight Status:  75.5 kg (10/21) -- stable  Body mass index is 27.70 kg/(m^2). Overweight.  Estimated needs:  1550-1850 kcals, 77-90 gm protein  Nutrition Dx:  Inadequate Oral Intake r/t inability to eat as evidenced by NPO status, ongoing  Goal:   TF to meet >90% of estimated protein needs, met  Monitor:  Adequacy and tolerance of EN, weight, labs, I/O's  Inda Coke MS, RD, LDN Pager: [phone] After-hours pager: [phone]

## 2012-08-20 NOTE — Progress Notes (Signed)
Occupational Therapy Treatment Patient Details Name: Amy [name] MRN: [id number] DOB: May 22, 1922 Today's Date: 08/20/2012 Time: [id number] OT Time Calculation (min): 15 min  OT Assessment / Plan / Recommendation Comments on Treatment Session Pt. continues to be very fatigued.  Only minimal improvement on ability to participate since last visit earlier today.  Pt. indicating severe pain Lt. foot - RN made aware    Follow Up Recommendations  Skilled nursing facility    Barriers to Discharge       Equipment Recommendations  None recommended by OT;None recommended by PT    Recommendations for Other Services    Frequency Min 2X/week   Plan Discharge plan remains appropriate    Precautions / Restrictions Precautions Precautions: Posterior Hip;Fall Precaution Booklet Issued: Yes (comment) Precaution Comments: Patient able to recall 2/3 hip precautions. Reviewed with her all 3. Required Braces or Orthoses: Other Brace/Splint Other Brace/Splint: Lt. wrist splint Restrictions Weight Bearing Restrictions: Yes LUE Weight Bearing: Weight bearing as tolerated LLE Weight Bearing: Weight bearing as tolerated   Pertinent Vitals/Pain     ADL  ADL Comments: Pt. able to don/doff splint    OT Diagnosis:    OT Problem List:   OT Treatment Interventions:     OT Goals ADL Goals ADL Goal: Additional Goal #1 - Progress: Progressing toward goals Arm Goals Arm Goal: AROM - Progress: Goal set today  Visit Information  Last OT Received On: 08/20/12 Assistance Needed: +2    Subjective Data      Prior Functioning       Cognition  Overall Cognitive Status: Appears within functional limits for tasks assessed/performed Arousal/Alertness: Lethargic Orientation Level: Appears intact for tasks assessed Behavior During Session: Lethargic    Mobility  Shoulder Instructions         Exercises  General Exercises - Upper Extremity Wrist Flexion: AROM;15 reps;Seated;Left Wrist Extension:  AROM;15 reps;Seated;Left   Balance     End of Session OT - End of Session Activity Tolerance: Patient limited by fatigue Patient left: in chair;with call bell/phone within reach;with family/visitor present Nurse Communication: Patient requests pain meds  Widener, Ellard Artis M 08/20/2012, 3:57 PM

## 2012-08-20 NOTE — Progress Notes (Signed)
Agree with A&P of JD,NP. Discussed with Dr Thereasa Solo - we will see again PRN

## 2012-08-20 NOTE — Progress Notes (Signed)
4 Days Post-Op  Subjective: Patient is A/A/O still complains of occasional neck pain. Family member at bedside.  Objective: Vital signs in last 24 hours: Temp:  [97.9 F (36.6 C)-99.2 F (37.3 C)] 98.9 F (37.2 C) (10/21 0410) Pulse Rate:  [83-100] 94  (10/21 0500) Resp:  [16-24] 21  (10/21 0500) BP: (103-147)/(34-57) 147/56 mmHg (10/21 0606) SpO2:  [92 %-100 %] 97 % (10/21 0500) Weight:  [166 lb 7.2 oz (75.5 kg)] 166 lb 7.2 oz (75.5 kg) (10/21 0700) Last BM Date: 08/11/12  Intake/Output from previous day: 10/20 0701 - 10/21 0700 In: 1736.5 [I.V.:20; IV Piggyback:401.5] Out: 2100 [Urine:2100] Intake/Output this shift:    General appearance: alert, cooperative, appears stated age and no distress Chest: CTA bilaterally Cardiac: RRR Abdomen: G-tube site c/d/i. Neck Wound: still draining purulent material, no crepitus noted. WBC slightly elevated from previous check, VSS, afebrile. Lab Results:   Basename 08/20/12 0430 08/19/12 0430  WBC 12.1* 11.2*  HGB 9.2* 9.3*  HCT 29.4* 29.6*  PLT 312 302   BMET  Basename 08/20/12 0430  NA 139  K 3.7  CL 97  CO2 39*  GLUCOSE 127*  BUN 47*  CREATININE 1.20*  CALCIUM 9.1   PT/INR No results found for this basename: LABPROT:2,INR:2 in the last 72 hours ABG No results found for this basename: PHART:2,PCO2:2,PO2:2,HCO3:2 in the last 72 hours  Studies/Results: Dg Chest Port 1 View  08/18/2012  *RADIOLOGY REPORT*  Clinical Data: Tachypnea, shortness of breath  PORTABLE CHEST - 1 VIEW  Comparison: 08/17/2012  Findings: Cardiomegaly with suspected mild interstitial edema. Mild patchy left lower lobe opacity, likely a combination of atelectasis and small pleural effusion.  Postsurgical changes related to prior CABG.  Left subclavian pacemaker.  IMPRESSION: Cardiomegaly with suspected mild interstitial edema and small left pleural effusion.  Additional patchy left lower lobe opacity, likely atelectasis.   Original Report Authenticated  By: Julian Hy, M.D.     Anti-infectives: Anti-infectives     Start     Dose/Rate Route Frequency Ordered Stop   08/19/12 1200   vancomycin (VANCOCIN) 750 mg in sodium chloride 0.9 % 150 mL IVPB        750 mg 150 mL/hr over 60 Minutes Intravenous Every 12 hours 08/19/12 1128     08/10/12 1830   piperacillin-tazobactam (ZOSYN) IVPB 3.375 g        3.375 g 12.5 mL/hr over 240 Minutes Intravenous 3 times per day 08/10/12 1730            Assessment/Plan: Patient Active Problem List  Diagnosis  . DYSPNEA  . Radial head fracture, closed, Lt  . Hypertension  . Hyperlipidemia, statin intol.  . CAD, CABG X 3 10/09. Myoview low risk 10/10. EF >55% 2D 6/13.  . Pre-operative clearance  . Fracture of left hip after fall at home.  . Aortic valve sclerosis, no stenosis  . Carotid bruit present bilat, dopplers OK 6/13  . History of UTI- recent completion 10 days Cipro  . Hx of dizziness  . Chronic renal insufficiency, stage III (moderate), SCr 1.4 Sept 2013  . PAF/SSS  . Pacemaker March 2010 (MDT)  . DVT after previous hip surgery  . Odynophagia due to new traumatic peri-op esophageal perforation  . Mediastinitis due to esophageal perforation-s/p Incision and drainage, right neck and mediastinum, transcervical approach. Cervical esophagoscopy  . Status post hip hemiarthroplasty left hip by Dr Ronnie Derby 08/08/2012  . Closed fracture of left distal radius s/p ORIF by Dr Burney Gauze 08/08/2012  .  S/P right total hip arthroplasty by Dr Noemi Chapel in 2009  . Acute blood loss anemia  . Acute respiratory failure with hypoxia  . Pulmonary edema, noncardiac   s/p Procedure(s) (LRB) with comments: GASTROSTOMY (N/A)  No further surgical intervention indicated at this time, we will sign off at this juncture. management per medicine team. Thank you for asking [place] to participate in this woman's care.   LOS: 13 days    [place], [facility] 08/20/2012

## 2012-08-20 NOTE — Progress Notes (Signed)
Clinical Social Work  Patient was discussed with MD and CM. [facility] is reviewing patient for possible admission. CSW will continue to follow to assist with SNF if LTAC is unable to accept patient.  [place], [place] [phone]

## 2012-08-20 NOTE — Progress Notes (Signed)
ANTIBIOTIC CONSULT NOTE - FOLLOW UP  Pharmacy Consult:  Vanc / Zosyn Indication: Empiric coverage in setting of esophageal perforation + mediastinitis  Allergies  Allergen Reactions  . Hydrocodone-Acetaminophen Itching    Tolerates tylenol    Patient Measurements: Height: 5\' 5"  (165.1 cm) Weight: 166 lb 7.2 oz (75.5 kg) IBW/kg (Calculated) : 57   Vital Signs: Temp: 98.8 F (37.1 C) (10/21 0824) Temp src: Oral (10/21 0824) BP: 139/39 mmHg (10/21 0824) Pulse Rate: 104  (10/21 0824) Intake/Output from previous day: 10/20 0701 - 10/21 0700 In: 1736.5 [I.V.:20; IV Piggyback:401.5] Out: 2100 [Urine:2100] Intake/Output from this shift:    Labs:  Basename 08/20/12 0430 08/19/12 0430 08/18/12 0500  WBC 12.1* 11.2* 9.9  HGB 9.2* 9.3* 9.3*  PLT 312 302 274  LABCREA -- -- --  CREATININE 1.20* -- --   Estimated Creatinine Clearance: 32.9 ml/min (by C-G formula based on Cr of 1.2). No results found for this basename: VANCOTROUGH:2,VANCOPEAK:2,VANCORANDOM:2,GENTTROUGH:2,GENTPEAK:2,GENTRANDOM:2,TOBRATROUGH:2,TOBRAPEAK:2,TOBRARND:2,AMIKACINPEAK:2,AMIKACINTROU:2,AMIKACIN:2, in the last 72 hours   Microbiology: Recent Results (from the past 720 hour(s))  URINE CULTURE     Status: Normal   Collection Time   08/07/12 11:24 AM      Component Value Range Status Comment   Specimen Description URINE, CATHETERIZED   Final    Special Requests NONE   Final    Culture  Setup Time 08/07/2012 16:50   Final    Colony Count NO GROWTH   Final    Culture NO GROWTH   Final    Report Status 08/08/2012 FINAL   Final   SURGICAL PCR SCREEN     Status: Normal   Collection Time   08/08/12  5:32 AM      Component Value Range Status Comment   MRSA, PCR NEGATIVE  NEGATIVE Final    Staphylococcus aureus NEGATIVE  NEGATIVE Final   WOUND CULTURE     Status: Normal   Collection Time   08/10/12 10:29 PM      Component Value Range Status Comment   Specimen Description WOUND ESOPHAGUS   Final    Special  Requests POF ZOSYN   Final    Gram Stain     Final    Value: FEW WBC PRESENT, PREDOMINANTLY PMN     RARE SQUAMOUS EPITHELIAL CELLS PRESENT     ABUNDANT GRAM POSITIVE RODS     FEW GRAM NEGATIVE RODS   Culture     Final    Value: MULTIPLE ORGANISMS PRESENT, NONE PREDOMINANT NO STAPHYLOCOCCUS AUREUS ISOLATED NO GROUP A STREP (S.PYOGENES) ISOLATED   Report Status 08/14/2012 FINAL   Final   ANAEROBIC CULTURE     Status: Normal   Collection Time   08/10/12 10:29 PM      Component Value Range Status Comment   Specimen Description WOUND ESOPHAGUS   Final    Special Requests PATIENT ON FOLLOWING ZOSYN   Final    Gram Stain     Final    Value: FEW WBC PRESENT, PREDOMINANTLY PMN     RARE SQUAMOUS EPITHELIAL CELLS PRESENT     ABUNDANT GRAM POSITIVE RODS     FEW GRAM NEGATIVE RODS   Culture NO ANAEROBES ISOLATED   Final    Report Status 08/15/2012 FINAL   Final     Anti-infectives     Start     Dose/Rate Route Frequency Ordered Stop   08/19/12 1200   vancomycin (VANCOCIN) 750 mg in sodium chloride 0.9 % 150 mL IVPB  750 mg 150 mL/hr over 60 Minutes Intravenous Every 12 hours 08/19/12 1128     08/10/12 1830   piperacillin-tazobactam (ZOSYN) IVPB 3.375 g        3.375 g 12.5 mL/hr over 240 Minutes Intravenous 3 times per day 08/10/12 1730              Assessment: 78 yo F found to have esophageal perforation on upper endoscopy this admission along with mediastinitis, now s/p I&D. Cultures have reported no significant growth.  Patient has low grade fever and noted infiltrate vs. atelectasis on CXR on 10/20; therefore, MD broadened antibiotic coverage to include vancomycin in addition to Zosyn.  Her renal function is trending up today.   Vanc 10/20 >> Zosyn 10/11 >>  10/11 Esophagus wound cx: multiple organisms 10/9 MRSA PCR neg 10/8 UCx >> ng   Goal of Therapy:  Vanc trough 15-20 mcg/mL   Plan:  - Change vanc to 1gm IV Q24H - Continue Zosyn 3.375g IV Q8H, 4 hr  infusion - Change PPI to PT - Follow up SCr, UOP, cultures, clinical course and adjust as clinically indicated. - With patients advanced age, consider VT at Css     Maanav Kassabian D. Mina Marble, PharmD, BCPS Pager:  [phone] 08/20/2012, 9:33 AM

## 2012-08-20 NOTE — Progress Notes (Signed)
[facility] Progress Note [place] TEAM 1 - Stepdown/ICU TEAM   Niger M Isadore CT:[id number] DOB: [birth year] DOA: 08/07/2012 PCP: Purvis Kilts, MD  Brief narrative: 78 year old female who lives alone. She experienced a mechanical fall going up the steps at home. This was not associated with loss of consciousness, prefall chest pain palpitations or lightheadedness. Despite this the daughter reports patient had been having suboptimal blood pressures with systolics in the [id number] - the patient had been complaining of some nonspecific dizziness. The patient was transported to the hospital via EMS and she was found to be in atrial fibrillation with RVR with a ventricular response of 120. She subsequently converted back to sinus rhythm. She was initially treated at [facility]. After arrival she was found to have an impacted fracture of left hip as well as a left radial head fracture. Because of her underlying multiple medical problems including coronary artery disease and chronic kidney disease she was subsequently transported to [facility] for further monitoring and treatment. She eventually underwent surgical treatment for her fractures. Because of complaints of odynophagia a swallowing evaluation was obtained which showed no evidence of oral pharyngeal dysphagia - she continued with odynophagia so a gastroenterology consult was obtained. She subsequently underwent EGD which revealed a mucosal defect in the cervical esophagus that was either a ulceration versus a perforation. She eventually underwent an esophagram with Gastrografin that confirmed a contained perforation. Cardiothoracic surgery was consulted. They felt that the etiology to the small perforation was related to a traumatic injury that was related to intubation at time of her initial orthopedic surgery on October 9. An ENT consult was obtained as well as a CT scan of the neck and chest to rule out any significant fluid collections.  The CT did reveal extensive retropharyngeal gas dissecting upwards from the mediastinum with an associated focal perforation of the esophagus at the level of the cricoid cartilage with oral contrast extravasating into the retro-pharyngeal spacing communicating with the superior mediastinum. Subsequently the patient underwent incision and drainage of this area by ENT surgeon. Currently she is stable on parenteral nutrition in the step down unit. Team 1 assumed care of this patient on 08/14/2012.  Impression and Plan:  Fracture of left hip/radial head after fall at home: A) Status post hip hemiarthroplasty left hip by Dr Ronnie Derby 08/08/2012 B) s/p ORIF by Dr Burney Gauze 08/08/2012 Management per orthopedic team - continue OT and PT   Traumatic peri-op esophageal perforation - Mediastinitis  s/p Incision and drainage, right neck and mediastinum, transcervical approach - appreciate Cardiothoracic and ENT assistance - s/p gastrostomy tube on 08/16/2012 by General Surgery - continue empiric IV Zosyn - esophogram today confirms that leak persists without significant improvement thus far - ENT continues to follow - discussed with CVTS this morning - there is some concern that she could be refluxing TF - we are following reflux precautions, but a J tube would even further decrease this likelihood - ENT has noted that they will speak to Gen Surg about this   Acute respiratory failure with hypoxia due to Pulmonary edema, noncardiac Suffered repeat episode last PM marked by tachypnea, HTN, and hypoxia - responded well to IV lasix - resume scheduled lasix due to overall + fluid balance for hospital stay - high risk for aspiration of secretions - repeat infection w/u - w/ low grade fever and noted infil vs/ atx on CXR will broaden abx coverage to include vanc - still has increased drainage from neck Penrose drain -  ENT will try to clarify by placing dye in tube feeding and see if similar appearing material is collected in  her Penrose drain - either way the drain will remain in place until a formed tract is established  Acute blood loss anemia with macrocytosis Status post transfusion of packed red blood cells this admission - Hemoglobin nadir 6.1 - likely etiology due to multiple recent surgical procedures - B12 sub optimal at 299 - have begun B12 repletion  Hypertension BP trend improved w/ hydralazine  Protein calorie malnutrition/hyperglycemia status post open gastrostomy tube 08/16/2012 TNA now stopped - PEG tube dependent due to strict NPO status -consider initiating recommended 200 cc free water 4 times a day - if tube feeding is refluxing into esophageal perforation may need to look into exchanging G-tube for jejunostomy tube  CAD, CABG X 3 10/09. Myoview low risk 10/10. EF >55% 2D 6/13 Received preoperative cardiac clearance and has remained asymptomatic since admission-because of recent issue with mild pulmonary edema we'll proceed with checking a 2-D echocardiogram since was last checked in 2009. It is suspected that the pulmonary edema was related to hypoalbumin state  Low-grade fever with persistent leukocytosis We'll rule out occult infection by checking urinalysis and culture -chest x-ray today demonstrated slightly worse interstitial haziness at the bases likely representing edema-if she develops diarrhea given broad-spectrum antibiotics would check stool for C. difficile colitis  Chronic renal insufficiency, stage III (moderate), SCr 1.4 Sept 2013 Renal function has increased to the dry side after patient was given Lasix for pulmonary symptoms over the weekend-follow electrolytes  Foot pain Several toes on left foot are slightly reddened in appearance-uric acid level is normal - check Xray to r/o fx of toe(s)  PAF/SSS history of Pacemaker March 2010 (MDT) stable with sinus rhythm and sinus tachycardia  Hyperlipidemia, statin intol. NPO  Aortic valve sclerosis, no stenosis  Carotid bruit  present bilat, dopplers OK 6/13 Neurologically stable postop  History of UTI - recent completion 10 days Cipro Urinalysis at presentation negative for infectious process - repeat UA ordered today   Hx of dizziness Initial fall reported as mechanical so no indication to pursue syncope work - dizziness could be rate issue in setting of afib  DVT after previous hip surgery SCDs  DVT prophylaxis: SCDs Code Status: DO NOT RESUSCITATE Family Communication: Spoke with patient's daughter  Disposition Plan: Remain in stepdown  Consultants: Orthopedics Cardiothoracic Gastroenterology ENT Gen Surgery  Procedures: ORIF distal left radial fracture with carpal tunnel release by Dr. Burney Gauze and left arthroplasty bipolar hip on 08/08/2012 by Dr. Ronnie Derby  EGD 08/10/2012 by Dr. Benson Norway  Incision and drainage of right neck and mediastinum with a transcervical approach as well as cervical esophagoscopy on [id number] by Dr. Erik Obey  Status post open gastrostomy tube placement with #24 Mal[place]ot tube by Dr. Grandville Silos 08/16/2012  Antibiotics: Zosyn 10/11 >>> Vancomycin 10/20>>  HPI/Subjective: Pt c/o pain in abdom as well as burning pain in her feet.  She is not sleeping well at night, and is very somnolent at this time.  Her daughter is at the bedside.  Objective: Blood pressure 139/39, pulse 104, temperature 98.8 F (37.1 C), temperature source Oral, resp. rate 17, height 5\' 5"  (1.651 m), weight 75.5 kg (166 lb 7.2 oz), SpO2 96.00%.  Intake/Output Summary (Last 24 hours) at 08/20/12 1301 Last data filed at 08/20/12 0610  Gross per 24 hour  Intake 1242.5 ml  Output   1750 ml  Net -507.5 ml     Exam:  General: no acute resp distress at this time  ENT: Neck incisions covered by dressing which is clean dry and intact Lungs: poor air movement in bases B - no wheeze  Cardiovascular: RRR - no gallup or rub  Abdomen: Mildly tender over her abdominal surgical incision, nondistended, soft, bowel  sounds positive, no rebound, no ascites, no appreciable mass, abdominal dressing is clean dry and intact Musculoskeletal: No significant cyanosis or clubbing of bilateral lower extremities - no erythema or calor of B feet - 2+ DP pulsed B LE Neurological: lethargic but otherwise appropriate. No appreciable neurological deficit  Data Reviewed: Basic Metabolic Panel:  Lab 0000000 0430 08/17/12 0450 08/16/12 0500 08/15/12 0505 08/14/12 0550  NA 139 142 142 144 143  K 3.7 3.9 3.5 3.5 3.7  CL 97 100 98 103 105  CO2 39* 38* 41* 38* 32  GLUCOSE 127* 158* 152* 159* 155*  BUN 47* 37* 37* 38* 33*  CREATININE 1.20* 0.87 0.88 0.90 0.83  CALCIUM 9.1 9.2 9.6 9.5 9.6  MG -- 1.8 1.9 1.7 1.9  PHOS -- 3.4 3.1 3.0 2.2*   Liver Function Tests:  Lab 08/16/12 0500  AST 36  ALT 28  ALKPHOS 256*  BILITOT 1.0  PROT 5.7*  ALBUMIN 2.1*   CBC:  Lab 08/20/12 0430 08/19/12 0430 08/18/12 0500 08/17/12 1700 08/17/12 0450 08/16/12 0500  WBC 12.1* 11.2* 9.9 -- 10.2 8.2  NEUTROABS 9.8* -- -- -- -- --  HGB 9.2* 9.3* 9.3* 8.9* 9.5* --  HCT 29.4* 29.6* 29.9* 33.8* 29.5* --  MCV 91.6 91.4 92.3 -- 91.9 91.3  PLT 312 302 274 -- 234 216   CBG:  Lab 08/20/12 0821 08/20/12 0448 08/20/12 0007 08/19/12 1928 08/19/12 1605  GLUCAP 119* 125* 105* 147* 116*    Recent Results (from the past 240 hour(s))  WOUND CULTURE     Status: Normal   Collection Time   08/10/12 10:29 PM      Component Value Range Status Comment   Specimen Description WOUND ESOPHAGUS   Final    Special Requests POF ZOSYN   Final    Gram Stain     Final    Value: FEW WBC PRESENT, PREDOMINANTLY PMN     RARE SQUAMOUS EPITHELIAL CELLS PRESENT     ABUNDANT GRAM POSITIVE RODS     FEW GRAM NEGATIVE RODS   Culture     Final    Value: MULTIPLE ORGANISMS PRESENT, NONE PREDOMINANT NO STAPHYLOCOCCUS AUREUS ISOLATED NO GROUP A STREP (S.PYOGENES) ISOLATED   Report Status 08/14/2012 FINAL   Final   ANAEROBIC CULTURE     Status: Normal   Collection  Time   08/10/12 10:29 PM      Component Value Range Status Comment   Specimen Description WOUND ESOPHAGUS   Final    Special Requests PATIENT ON FOLLOWING ZOSYN   Final    Gram Stain     Final    Value: FEW WBC PRESENT, PREDOMINANTLY PMN     RARE SQUAMOUS EPITHELIAL CELLS PRESENT     ABUNDANT GRAM POSITIVE RODS     FEW GRAM NEGATIVE RODS   Culture NO ANAEROBES ISOLATED   Final    Report Status 08/15/2012 FINAL   Final      Studies:  Recent x-ray studies have been reviewed in detail by the Attending Physician  Scheduled Meds:  Reviewed in detail by the Attending Physician   Cherene Altes, MD [facility] Office  [phone] Pager [phone]  On-Call/Text Page:  [email or web address]      password [email or web address]  08/20/2012, 1:01 PM   LOS: 13 days

## 2012-08-20 NOTE — Progress Notes (Signed)
   CARDIOTHORACIC SURGERY PROGRESS NOTE  4 Days Post-Op  S/P Procedure(s) (LRB): GASTROSTOMY (N/A)  Subjective: Doesn't feel well.  Denies chest pain or shortness of breath.  + cough  Objective: Vital signs in last 24 hours: Temp:  [97.9 F (36.6 C)-99.2 F (37.3 C)] 98.5 F (36.9 C) (10/21 1640) Pulse Rate:  [88-104] 94  (10/21 1640) Cardiac Rhythm:  [-] Normal sinus rhythm (10/21 0725) Resp:  [13-24] 13  (10/21 1640) BP: (103-147)/(34-57) 120/42 mmHg (10/21 1640) SpO2:  [93 %-98 %] 94 % (10/21 1640) Weight:  [75.5 kg (166 lb 7.2 oz)] 75.5 kg (166 lb 7.2 oz) (10/21 0700)  Physical Exam:  Rhythm:   sinus  Breath sounds: Coarse, diminished at bases  Heart sounds:  RRR  Incisions:  Neck drains intact, still with copious drainage  Abdomen:  soft  Extremities:  warm   Intake/Output from previous day: 10/20 0701 - 10/21 0700 In: 1736.5 [I.V.:20; IV Piggyback:401.5] Out: 2100 [Urine:2100] Intake/Output this shift: Total I/O In: -  Out: 925 [Urine:925]  Lab Results:  [facility] 08/20/12 0430 08/19/12 0430  WBC 12.1* 11.2*  HGB 9.2* 9.3*  HCT 29.4* 29.6*  PLT 312 302   BMET:  Basename 08/20/12 0430  NA 139  K 3.7  CL 97  CO2 39*  GLUCOSE 127*  BUN 47*  CREATININE 1.20*  CALCIUM 9.1    CBG (last 3)   Basename 08/20/12 1228 08/20/12 0821 08/20/12 0448  GLUCAP 102* 119* 125*   PT/INR:  No results found for this basename: LABPROT,INR in the last 72 hours  CXR:  *RADIOLOGY REPORT*  Clinical Data: Right upper chest pain and shortness of breath.  PORTABLE CHEST - 1 VIEW  Comparison: 08/18/2012.  Findings: Slightly greater interstitial haziness in the lung bases  compared to the previous study. Questionable small effusions,  stable. The mid and upper lung fields remain clear. There have  been no other interval changes. Status post CABG with dual chamber  cardiac pacing wires and mild cardiomegaly. Right PICC line tip is  just above the cavoatrial junction.    IMPRESSION:  Slightly worse interstitial haziness at the bases likely  representing edema. No other interval changes.  Original Report Authenticated By: [name], M.D.     ESOPHOGRAM/BARIUM SWALLOW  Technique: Single contrast examination was performed using water  soluble contrast.  Fluoroscopy time: 1.16 minutes.  Comparison: 08/10/2012.  Findings: Fluoroscopic images were obtained during multiple  swallow attempts with water soluble contrast material (Omnipaque-  300). These demonstrated persistent perforation of the lower  cervical esophagus and leakage of oral contrast material into the  posterior mediastinum, extending inferiorly to the level of the  aortic arch.  IMPRESSION:  1. Study is positive for persistent esophageal perforation, as  above.  These results were called by telephone on 08/20/2012 at 12:22 p.m.  to Dr. Thereasa Solo, who verbally acknowledged these results.     Assessment/Plan: S/P Procedure(s) (LRB): GASTROSTOMY (N/A)  Minimal progress. Leak of lower cervical esophagus persists, although this is not surprising. WBC increased slightly.   Post-pyloric feeding might be optimal under the circumstances. I would not favor removing Penrose drains at this time. Will obtain chest CT if WBC rises further or fevers develop.  OWEN,CLARENCE H 08/20/2012 4:49 PM

## 2012-08-20 NOTE — Progress Notes (Signed)
Occupational Therapy Treatment Patient Details Name: Amy [name] MRN: [id number] DOB: 07-23-1922 Today's Date: 08/20/2012 Time: [id number] OT Time Calculation (min): 10 min  OT Assessment / Plan / Recommendation Comments on Treatment Session Pt. very fatigued this morning, and unable to maintain arrousal to participate.  Splint fitting well    Follow Up Recommendations  Skilled nursing facility    Barriers to Discharge       Equipment Recommendations  None recommended by OT;None recommended by PT    Recommendations for Other Services    Frequency Min 2X/week   Plan Discharge plan remains appropriate    Precautions / Restrictions Precautions Precautions: Posterior Hip;Fall Precaution Comments: Patient able to recall 2/3 hip precautions. Reviewed with her all 3. Required Braces or Orthoses: Other Brace/Splint Other Brace/Splint: Lt. wrist splint Restrictions Weight Bearing Restrictions: Yes LUE Weight Bearing: Weight bearing as tolerated LLE Weight Bearing: Weight bearing as tolerated   Pertinent Vitals/Pain     ADL  ADL Comments: Pt and dtr deny problems from splint.  Splint removed and skin checked.  No signs of redness.  Pt. performed 5 reps of wrist extension with max cues, due to lethargy.  Pt unable to maintain arrousal to participate.  Dtr reports pt. with busy morning and did not sleep well last night    OT Diagnosis:    OT Problem List:   OT Treatment Interventions:     OT Goals ADL Goals ADL Goal: Additional Goal #1 - Progress: Progressing toward goals Arm Goals Arm Goal: AROM - Progress: Progressing toward goal  Visit Information  Last OT Received On: 08/20/12 Assistance Needed: +2    Subjective Data      Prior Functioning       Cognition  Overall Cognitive Status: Appears within functional limits for tasks assessed/performed Arousal/Alertness: Lethargic Orientation Level: Appears intact for tasks assessed Behavior During Session: Lethargic      Mobility  Shoulder Instructions         Exercises  General Exercises - Upper Extremity Wrist Extension: AROM;5 reps;Supine   Balance     End of Session OT - End of Session Activity Tolerance: Patient limited by fatigue Patient left: in bed;with call bell/phone within reach  [place], Misti Towle M 08/20/2012, 1:04 PM

## 2012-08-20 NOTE — Progress Notes (Addendum)
Physical Therapy Treatment Patient Details Name: Amy [name] MRN: [id number] DOB: 14-Feb-1922 Today's Date: 08/20/2012 Time: [id number] PT Time Calculation (min): 48 min  PT Assessment / Plan / Recommendation Comments on Treatment Session  Pt c/o pain in L great toe being worse than the pain in her arm or hip.  Pt presents with reddness and inflamation at the DIP joint of the first toe and redness throughout the 3rd toe on the Left foot.  Pt has a history of gout but Uric acid test was negative.  Pt presents with tenderness to palpation and PROM of Left first and third toes.  Pt also tender to palpation of Right first toe. Pt may benefit from further MD evaluation of Left foot as this pain is the primary limiting factor for Amy [name]'s mobility at this time.     Follow Up Recommendations  Post acute inpatient     Does the patient have the potential to tolerate intense rehabilitation  No, Recommend SNF  Barriers to Discharge        Equipment Recommendations  None recommended by OT;None recommended by PT    Recommendations for Other Services    Frequency Min 4X/week   Plan Discharge plan remains appropriate;Frequency remains appropriate    Precautions / Restrictions Precautions Precautions: Posterior Hip;Fall Precaution Booklet Issued: Yes (comment) Precaution Comments: Patient able to recall 2/3 hip precautions. Reviewed with her all 3. Required Braces or Orthoses: Other Brace/Splint Other Brace/Splint: Lt. wrist splint Restrictions Weight Bearing Restrictions: Yes LUE Weight Bearing: Weight bearing as tolerated LLE Weight Bearing: Weight bearing as tolerated   Pertinent Vitals/Pain Pt c/o 4/10 pain in Left big toe. See comments above.      Mobility  Bed Mobility Bed Mobility: Supine to Sit;Sitting - Scoot to Edge of Bed Rolling Right: Not tested (comment) Rolling Left: Not tested (comment) Right Sidelying to Sit: Not tested (comment) Left Sidelying to Sit: Not tested  (comment) Supine to Sit: 1: +2 Total assist;HOB flat;With rails Supine to Sit: Patient Percentage: 50% Sitting - Scoot to Edge of Bed: 3: Mod assist Sitting - Scoot to Edge of Bed: Patient Percentage: 50% Sit to Supine: Not Tested (comment) Details for Bed Mobility Assistance: Cues for technique, assist for L LE and trunk secondary to limited use of L UE.   Transfers Transfers: Sit to Stand;Stand to Sit Sit to Stand: 3: Mod assist;With upper extremity assist;From bed;From chair/3-in-1 Sit to Stand: Patient Percentage: 60% Stand to Sit: 3: Mod assist;With armrests;To bed;To chair/3-in-1;With upper extremity assist Stand to Sit: Patient Percentage: 60% Stand Pivot Transfers: 3: Mod assist Stand Pivot Transfers: Patient Percentage: 60% Details for Transfer Assistance: Pt transferred from bed<--> 3in1 and bed --> Recliner.  Verbal cues for hand placement.  Verbal cues to wb on L heel secondary to pain in L great toe.   Assist with platform strap needed.  Verbal cues for placement of LLE during transitions. Ambulation/Gait Ambulation/Gait Assistance: Not tested (comment)    Exercises General Exercises - Upper Extremity Wrist Flexion: AROM;15 reps;Seated;Left Wrist Extension: AROM;15 reps;Seated;Left   PT Diagnosis:    PT Problem List:   PT Treatment Interventions:     PT Goals Acute Rehab PT Goals PT Goal Formulation: With patient Time For Goal Achievement: 08/25/12 Potential to Achieve Goals: Fair Pt will go Supine/Side to Sit: with min assist PT Goal: Supine/Side to Sit - Progress: Updated due to goal met Pt will go Sit to Supine/Side: with +2 total assist PT Goal: Sit to Supine/Side -  Progress: Not met Pt will go Sit to Stand: with min assist PT Goal: Sit to Stand - Progress: Updated due to goal met Pt will go Stand to Sit: with min assist PT Goal: Stand to Sit - Progress: Updated due to goals met Pt will Transfer Bed to Chair/Chair to Bed: with modified independence PT  Transfer Goal: Bed to Chair/Chair to Bed - Progress: Progressing toward goal Pt will Ambulate: 16 - 50 feet;with mod assist;with least restrictive assistive device PT Goal: Ambulate - Progress: Not met  Visit Information  Last PT Received On: 08/20/12 Assistance Needed: +2    Subjective Data      Cognition  Overall Cognitive Status: Appears within functional limits for tasks assessed/performed Arousal/Alertness: Lethargic Orientation Level: Appears intact for tasks assessed Behavior During Session: Lethargic    Balance     End of Session PT - End of Session Equipment Utilized During Treatment: Gait belt (wrist splint. ) Activity Tolerance: Patient limited by pain Patient left: in chair;with call bell/phone within reach;with family/visitor present Nurse Communication: Mobility status;Other (comment) (pain in L and R great toe.)   GP     Amy [name] 08/20/2012, 5:36 PM Amy [name] L. Phyliss Hulick DPT [phone]

## 2012-08-21 DIAGNOSIS — N289 Disorder of kidney and ureter, unspecified: Secondary | ICD-10-CM

## 2012-08-21 LAB — GLUCOSE, CAPILLARY
Glucose-Capillary: 128 mg/dL — ABNORMAL HIGH (ref 70–99)
Glucose-Capillary: 118 mg/dL — ABNORMAL HIGH (ref 70–99)
Glucose-Capillary: 131 mg/dL — ABNORMAL HIGH (ref 70–99)
Glucose-Capillary: 141 mg/dL — ABNORMAL HIGH (ref 70–99)
Glucose-Capillary: 145 mg/dL — ABNORMAL HIGH (ref 70–99)
Glucose-Capillary: 103 mg/dL — ABNORMAL HIGH (ref 70–99)

## 2012-08-21 LAB — CBC
HCT: 28.3 % — ABNORMAL LOW (ref 36.0–46.0)
Hemoglobin: 8.9 g/dL — ABNORMAL LOW (ref 12.0–15.0)
RBC: 3.07 MIL/uL — ABNORMAL LOW (ref 3.87–5.11)

## 2012-08-21 LAB — URINE CULTURE
Colony Count: NO GROWTH
Culture: NO GROWTH

## 2012-08-21 NOTE — Progress Notes (Signed)
Physical Therapy Treatment Patient Details Name: Amy [name] MRN: [id number] DOB: 04-08-1922 Today's Date: 08/21/2012 Time: [id number] PT Time Calculation (min): 35 min  PT Assessment / Plan / Recommendation Comments on Treatment Session  Patient s/p hip hemiarthroplasy left LE as well as left wrist splinted.  Also esophageal tear and PEG placed.  Pt. progressed wtih ambulation today.      Follow Up Recommendations  Post acute inpatient     Does the patient have the potential to tolerate intense rehabilitation  No, Recommend SNF  Barriers to Discharge        Equipment Recommendations  None recommended by PT    Recommendations for Other Services    Frequency Min 4X/week   Plan Discharge plan remains appropriate;Frequency remains appropriate    Precautions / Restrictions Precautions Precautions: Fall;Posterior Hip Precaution Booklet Issued: Yes (comment) Precaution Comments: Patient recalled 2/3 precautions. Required Braces or Orthoses: Other Brace/Splint Other Brace/Splint: L wrist splint Restrictions Weight Bearing Restrictions: Yes LUE Weight Bearing: Weight bear through elbow only LLE Weight Bearing: Weight bearing as tolerated   Pertinent Vitals/Pain HR up to 142 bpm with activity; Some pain    Mobility  Bed Mobility Bed Mobility: Supine to Sit;Sitting - Scoot to Edge of Bed Rolling Right: Not tested (comment) Rolling Left: Not tested (comment) Right Sidelying to Sit: Not tested (comment) Left Sidelying to Sit: Not tested (comment) Supine to Sit: 3: Mod assist;HOB elevated Sitting - Scoot to Edge of Bed: 3: Mod assist Sit to Supine: Not Tested (comment) Details for Bed Mobility Assistance: cues for technique, assist for L LE and trunk secondary to limited use of L UE. Transfers Transfers: Sit to Stand;Stand to Sit Sit to Stand: 3: Mod assist;With upper extremity assist;From bed Stand to Sit: 3: Mod assist;With upper extremity assist;With armrests;To  chair/3-in-1 Stand Pivot Transfers: 3: Mod assist Details for Transfer Assistance: Pt. transferred from bed to 3N1 to ambulation to recliner.  Verbal cues for hand placement.  Assist with platform strap as well.  Verbal cues for placement of LLE during transitions.   Ambulation/Gait Ambulation/Gait Assistance: 3: Mod assist Ambulation Distance (Feet): 60 Feet Assistive device: Left platform walker;Rolling walker Ambulation/Gait Assistance Details: verbal cues for safe use of walker and gait sequence.  Pt. needed constant cues for this.  Needed assist to move RW as well.   Gait Pattern: Step-to pattern;Decreased stance time - left;Decreased step length - right;Trunk flexed Gait velocity: decreased Stairs: No Wheelchair Mobility Wheelchair Mobility: No    PT Goals Acute Rehab PT Goals PT Goal: Supine/Side to Sit - Progress: Progressing toward goal PT Goal: Sit to Stand - Progress: Progressing toward goal PT Goal: Stand to Sit - Progress: Progressing toward goal PT Transfer Goal: Bed to Chair/Chair to Bed - Progress: Progressing toward goal PT Goal: Ambulate - Progress: Progressing toward goal  Visit Information  Last PT Received On: 08/21/12 Assistance Needed: +2    Subjective Data  Subjective: "I need to use the bathroom."   Cognition  Overall Cognitive Status: Appears within functional limits for tasks assessed/performed Arousal/Alertness: Lethargic Orientation Level: Appears intact for tasks assessed Behavior During Session: Lethargic Cognition - Other Comments: Lethargic initially but did perk up during treatment.    Balance  Static Sitting Balance Static Sitting - Balance Support: Feet supported;Right upper extremity supported Static Sitting - Level of Assistance: 5: Stand by assistance Static Sitting - Comment/# of Minutes: 5 min EOB  End of Session PT - End of Session Equipment Utilized During Treatment: Gait  belt (wrist splint) Activity Tolerance: Patient limited by  pain Patient left: in chair;with call bell/phone within reach;with family/visitor present Nurse Communication: Mobility status       INGOLD,Jayleen Scaglione 08/21/2012, 1:42 PM  [facility] Acute Rehabilitation [phone] (pager)

## 2012-08-21 NOTE — Progress Notes (Signed)
TCTS BRIEF PROGRESS NOTE   No significant changes.  Discussed feeding tube with Dr Margot Chimes (General Surgery).  The patient had an open gastrostomy tube placed using a 24 Fr Malecott tube on 10/17.  It is possible that this tube could be converted to a G-J tube by Interventional Radiology to facilitate post-pyloric feeding (this is called a G-J conversion, and IR has kits designed for this).  However, I would not recommend removing/replacing the current Malecott tube any time soon.  Lj Miyamoto H 08/21/2012 7:05 PM

## 2012-08-21 NOTE — Progress Notes (Signed)
[facility] Progress Note [place] TEAM 1 - Stepdown/ICU TEAM   Amy [name] [id number] DOB: 1922/04/02 DOA: 08/07/2012 PCP: Purvis Kilts, MD  Brief narrative: 78 year old female who lives alone. She experienced a mechanical fall going up the steps at home. This was not associated with loss of consciousness, prefall chest pain palpitations or lightheadedness. Despite this the daughter reports patient had been having suboptimal blood pressures with systolics in the [id number] - the patient had been complaining of some nonspecific dizziness. The patient was transported to the hospital via EMS and she was found to be in atrial fibrillation with RVR with a ventricular response of 120. She subsequently converted back to sinus rhythm. She was initially treated at [facility]. After arrival she was found to have an impacted fracture of left hip as well as a left radial head fracture. Because of her underlying multiple medical problems including coronary artery disease and chronic kidney disease she was subsequently transported to [facility] for further monitoring and treatment. She eventually underwent surgical treatment for her fractures. Because of complaints of odynophagia a swallowing evaluation was obtained which showed no evidence of oral pharyngeal dysphagia - she continued with odynophagia so a gastroenterology consult was obtained. She subsequently underwent EGD which revealed a mucosal defect in the cervical esophagus that was either a ulceration versus a perforation. She eventually underwent an esophagram with Gastrografin that confirmed a contained perforation. Cardiothoracic surgery was consulted. They felt that the etiology to the small perforation was related to a traumatic injury that was related to intubation at time of her initial orthopedic surgery on October 9. An ENT consult was obtained as well as a CT scan of the neck and chest to rule out any significant fluid collections.  The CT did reveal extensive retropharyngeal gas dissecting upwards from the mediastinum with an associated focal perforation of the esophagus at the level of the cricoid cartilage with oral contrast extravasating into the retro-pharyngeal spacing communicating with the superior mediastinum. Subsequently the patient underwent incision and drainage of this area by ENT surgeon. Currently she is stable on parenteral nutrition in the step down unit. Team 1 assumed care of this patient on 08/14/2012.  Impression and Plan:  Fracture of left hip/radial head after fall at home: A) Status post hip hemiarthroplasty left hip by Dr Ronnie Derby 08/08/2012 B) s/p ORIF by Dr Burney Gauze 08/08/2012 Management per orthopedic team - continue OT and PT   Traumatic peri-op esophageal perforation - Mediastinitis  s/p Incision and drainage, right neck and mediastinum, transcervical approach - appreciate Cardiothoracic and ENT assistance - s/p gastrostomy tube on 08/16/2012 by General Surgery - continue empiric IV Zosyn - esophogram today confirms that leak persists without significant improvement thus far - ENT continues to follow - discussed with CVTS this morning - there is some concern that she could be refluxing TF - we are following reflux precautions, but a J tube would even further decrease this likelihood - ENT has noted that they will speak to Gen Surg about this -gastrograffin esophagram 10/21 demonstrated persistent perforation  Acute respiratory failure with hypoxia due to Pulmonary edema, noncardiac Suffered repeat episode last night marked by tachypnea, HTN, and hypoxia - responded well to IV lasix - resume scheduled lasix due to overall + fluid balance for hospital stay - high risk for aspiration of secretions - repeat infection w/u - w/ low grade fever and noted infil vs/ atx on CXR will broaden abx coverage to include vanc - still has increased  drainage from neck Penrose drain - ENT will try to clarify by placing dye  in tube feeding and see if similar appearing material is collected in her Penrose drain - either way the drain will remain in place until a formed tract is established  Acute blood loss anemia with macrocytosis Status post transfusion of packed red blood cells this admission - Hemoglobin nadir 6.1 - likely etiology due to multiple recent surgical procedures - B12 sub optimal at 299 - have begun B12 repletion  Hypertension BP trend improved w/ hydralazine  Protein calorie malnutrition/hyperglycemia status post open gastrostomy tube 08/16/2012 TNA now stopped - PEG tube dependent due to strict NPO status -consider initiating recommended 200 cc free water 4 times a day - if tube feeding is refluxing into esophageal perforation may need to look into exchanging G-tube for jejunostomy tube  CAD, CABG X 3 10/09. Myoview low risk 10/10. EF >55% 2D 6/13 Received preoperative cardiac clearance and has remained asymptomatic since admission-because of recent issue with mild pulmonary edema we'll proceed with checking a 2-D echocardiogram since was last checked in 2009. It is suspected that the pulmonary edema was related to hypoalbumin state  Low-grade fever with persistent leukocytosis UA is negative, f/u on culture results- note that pt is on antibiotics -chest x-ray today demonstrated slightly worse interstitial haziness at the bases likely representing edema-  Chronic renal insufficiency, stage III (moderate), SCr 1.4 Sept 2013 Renal function has increased after patient was given Lasix for pulmonary symptoms over the weekend-follow electrolytes  Foot pain Several toes on left foot are slightly reddened in appearance-uric acid level is normal - xray does not reveal any abnormalities.   PAF/SSS history of Pacemaker March 2010 (MDT) stable with sinus rhythm and sinus tachycardia  Hyperlipidemia, statin intol. NPO  Aortic valve sclerosis, no stenosis  Carotid bruit present bilat, dopplers OK  6/13 Neurologically stable postop  History of UTI - recent completion 10 days Cipro Urinalysis at presentation negative for infectious process - repeat UA ordered today   Hx of dizziness Initial fall reported as mechanical so no indication to pursue syncope work - dizziness could be rate issue in setting of afib  DVT after previous hip surgery SCDs  DVT prophylaxis: SCDs Code Status: DO NOT RESUSCITATE Family Communication: Spoke with patient's daughter  Disposition Plan: Remain in stepdown  Consultants: Orthopedics Cardiothoracic Gastroenterology ENT Gen Surgery  Procedures: ORIF distal left radial fracture with carpal tunnel release by Dr. Burney Gauze and left arthroplasty bipolar hip on 08/08/2012 by Dr. Ronnie Derby  EGD 08/10/2012 by Dr. Benson Norway  Incision and drainage of right neck and mediastinum with a transcervical approach as well as cervical esophagoscopy on [id number] by Dr. Erik Obey  Status post open gastrostomy tube placement with #24 Malecot tube by Dr. Grandville Silos 08/16/2012  Antibiotics: Zosyn 10/11 >>> Vancomycin 10/20>>  HPI/Subjective: Pt c/o pain in abdom as well as burning pain in her feet.  She is not sleeping well at night, and is very somnolent at this time.  Her daughter is at the bedside.  Objective: Blood pressure 114/41, pulse 86, temperature 99 F (37.2 C), temperature source Axillary, resp. rate 22, height 5\' 5"  (1.651 m), weight 75.5 kg (166 lb 7.2 oz), SpO2 97.00%.  Intake/Output Summary (Last 24 hours) at 08/21/12 1316 Last data filed at 08/21/12 1200  Gross per 24 hour  Intake 1412.5 ml  Output   1275 ml  Net  137.5 ml     Exam: General: no acute resp distress at this  time  ENT: Neck incisions covered by dressing which is clean dry and intact Lungs: poor air movement in bases B - no wheeze  Cardiovascular: RRR - no gallup or rub  Abdomen: Mildly tender over her abdominal surgical incision, nondistended, soft, bowel sounds positive, no  rebound, no ascites, no appreciable mass, abdominal dressing is clean dry and intact Musculoskeletal: No significant cyanosis or clubbing of bilateral lower extremities - no erythema or calor of B feet - 2+ DP pulsed B LE Neurological: Awake and appropriate. No appreciable neurological deficit  Data Reviewed: Basic Metabolic Panel:  Lab 0000000 0430 08/17/12 0450 08/16/12 0500 08/15/12 0505  NA 139 142 142 144  K 3.7 3.9 3.5 3.5  CL 97 100 98 103  CO2 39* 38* 41* 38*  GLUCOSE 127* 158* 152* 159*  BUN 47* 37* 37* 38*  CREATININE 1.20* 0.87 0.88 0.90  CALCIUM 9.1 9.2 9.6 9.5  MG -- 1.8 1.9 1.7  PHOS -- 3.4 3.1 3.0   Liver Function Tests:  Lab 08/16/12 0500  AST 36  ALT 28  ALKPHOS 256*  BILITOT 1.0  PROT 5.7*  ALBUMIN 2.1*   CBC:  Lab 08/21/12 0425 08/20/12 0430 08/19/12 0430 08/18/12 0500 08/17/12 1700 08/17/12 0450  WBC 9.8 12.1* 11.2* 9.9 -- 10.2  NEUTROABS -- 9.8* -- -- -- --  HGB 8.9* 9.2* 9.3* 9.3* 8.9* --  HCT 28.3* 29.4* 29.6* 29.9* 33.8* --  MCV 92.2 91.6 91.4 92.3 -- 91.9  PLT 347 312 302 274 -- 234   CBG:  Lab 08/21/12 0809 08/21/12 0419 08/21/12 0004 08/20/12 2006 08/20/12 1639  GLUCAP 131* 118* 128* 108* 146*    No results found for this or any previous visit (from the past 240 hour(s)).   Studies:  Recent x-ray studies have been reviewed in detail by the Attending Physician  Scheduled Meds:  Reviewed in detail by the Attending Physician   Erin Hearing, [place] [facility] Office  [phone] Pager [phone]  On-Call/Text Page:      [email or web address]      password [facility]  08/21/2012, 1:16 PM   LOS: 14 days      I have examined the patient and reviewed the chart. I have modified the above note and agree with it.   Debbe Odea, MD [phone]

## 2012-08-21 NOTE — Progress Notes (Signed)
08/21/12 1539  PT Visit Information  Last PT Received On 08/21/12  Assistance Needed +1  PT Time Calculation  PT Start Time 1502  PT Stop Time 1517  PT Time Calculation (min) 15 min  Subjective Data  Subjective "I will try."  Precautions  Precautions Posterior Hip;Fall  Cognition  Overall Cognitive Status Appears within functional limits for tasks assessed/performed  Arousal/Alertness Lethargic  Orientation Level Appears intact for tasks assessed  Behavior During Session Lethargic  Bed Mobility  Bed Mobility Not assessed  Rolling Right Not tested (comment)  Rolling Left Not tested (comment)  Right Sidelying to Sit Not tested (comment)  Left Sidelying to Sit Not tested (comment)  Supine to Sit Not tested (comment)  Sitting - Scoot to Edge of Bed Not tested (comment)  Sit to Supine Not Tested (comment)  Transfers  Transfers Not assessed  Ambulation/Gait  Ambulation/Gait Assistance Not tested (comment)  Wheelchair Mobility  Wheelchair Mobility No  Balance  Balance Assessed No  Exercises  Exercises Other exercises (AROM heel slides and hip abduction x 10 reps right LE)  Total Joint Exercises  Ankle Circles/Pumps AROM;Both;10 reps;Strengthening;Supine  Quad Sets AROM;Strengthening;Both;10 reps;Supine  Heel Slides AAROM;Strengthening;10 reps;Supine;Left  Hip ABduction/ADduction AAROM;Left;10 reps;Supine  PT - End of Session  Equipment Utilized During Treatment Oxygen  Activity Tolerance Patient tolerated treatment well  Patient left in bed;with call bell/phone within reach;with family/visitor present  Nurse Communication Mobility status;Patient requests pain meds  PT - Assessment/Plan  Comments on Treatment Session Patient s/p hip hemiarthroplasty left LE as well as wrist fx with splint.  Esophageal tear post op.  Progressing with mobility.  Educated patient and daughter re: exercises and daughter to assist with exercises 2 x/day.    PT Plan Discharge plan remains  appropriate;Frequency remains appropriate  PT Frequency Min 4X/week  Follow Up Recommendations Post acute inpatient  Does the patient have the potential to tolerate intense rehabilitation? No, Recommend LTACH  Equipment Recommended None recommended by PT  Acute Rehab PT Goals  Pt will Perform Home Exercise Program with supervision, verbal cues required/provided  PT Goal: Perform Home Exercise Program - Progress Goal set today  PT General Charges  $$ ACUTE PT VISIT 1 Procedure  PT Treatments  $Therapeutic Exercise 8-22 mins   [facility] Acute Rehabilitation Services [phone]

## 2012-08-21 NOTE — Plan of Care (Signed)
Problem: Phase I Progression Outcomes Goal: OOB as tolerated unless otherwise ordered Outcome: Progressing Pt tolerated 2 hours OOB in chair. Pain in left toes r/t gout. Pt continent of urine and bowel. Penrose patent. Incision sites clean and OTA.

## 2012-08-21 NOTE — Progress Notes (Signed)
  Echocardiogram 2D Echocardiogram has been performed.  Basilia Jumbo 08/21/2012, 11:34 AM

## 2012-08-22 LAB — CBC
Platelets: 321 10*3/uL (ref 150–400)
RBC: 2.93 MIL/uL — ABNORMAL LOW (ref 3.87–5.11)
RDW: 15.1 % (ref 11.5–15.5)
WBC: 9.1 10*3/uL (ref 4.0–10.5)

## 2012-08-22 LAB — GLUCOSE, CAPILLARY
Glucose-Capillary: 127 mg/dL — ABNORMAL HIGH (ref 70–99)
Glucose-Capillary: 143 mg/dL — ABNORMAL HIGH (ref 70–99)

## 2012-08-22 NOTE — Progress Notes (Signed)
Occupational Therapy Treatment Patient Details Name: Amy [name] MRN: [id number] DOB: 1922-04-14 Today's Date: 08/22/2012 Time: [id number] OT Time Calculation (min): 26 min  OT Assessment / Plan / Recommendation Comments on Treatment Session Pt. significantly improved this date.  Pt maintained arrousal throughout entire session and able to actively particiapte.      Follow Up Recommendations  Skilled nursing facility;Supervision/Assistance - 24 hour;LTACH    Barriers to Discharge       Equipment Recommendations  None recommended by PT;None recommended by OT    Recommendations for Other Services    Frequency Min 2X/week   Plan Discharge plan remains appropriate    Precautions / Restrictions Precautions Precautions: Posterior Hip;Fall Precaution Booklet Issued: Yes (comment) Precaution Comments: Pt able to verbalize 3/3 precautions Required Braces or Orthoses: Other Brace/Splint Other Brace/Splint: L wrist splint Restrictions Weight Bearing Restrictions: Yes LUE Weight Bearing: Weight bearing as tolerated LLE Weight Bearing: Weight bearing as tolerated   Pertinent Vitals/Pain     ADL  Toilet Transfer: Moderate assistance Toilet Transfer Method: Stand pivot Toilet Transfer Equipment: Bedside commode Equipment Used: Rolling walker Transfers/Ambulation Related to ADLs: Pt. requires mod A for sit to stand ADL Comments: Pt. able to don/doff splint independently.  Able to self check skin.  pt. denies pain from spint.  Pt. able to use lt. UE as a support when moving stand to sit.      OT Diagnosis:    OT Problem List:   OT Treatment Interventions:     OT Goals ADL Goals ADL Goal: Grooming - Progress: Progressing toward goals ADL Goal: Upper Body Dressing - Progress: Progressing toward goals ADL Goal: Lower Body Dressing - Progress: Progressing toward goals ADL Goal: Toilet Transfer - Progress: Progressing toward goals ADL Goal: Toileting - Clothing Manipulation -  Progress: Progressing toward goals ADL Goal: Toileting - Hygiene - Progress: Progressing toward goals ADL Goal: Additional Goal #1 - Progress: Progressing toward goals Arm Goals Arm Goal: AROM - Progress: Progressing toward goal Arm Goal: Additional Goal #1 - Progress: Progressing toward goals Miscellaneous OT Goals OT Goal: Miscellaneous Goal #2 - Progress: Progressing toward goals  Visit Information  Last OT Received On: 08/22/12 Assistance Needed: +1    Subjective Data      Prior Functioning       Cognition  Overall Cognitive Status: Appears within functional limits for tasks assessed/performed Arousal/Alertness: Awake/alert Orientation Level: Appears intact for tasks assessed Behavior During Session: [facility] for tasks performed    Mobility  Shoulder Instructions Transfers Transfers: Sit to Stand;Stand to Sit Sit to Stand: 3: Mod assist;With upper extremity assist;From chair/3-in-1 Stand to Sit: 3: Mod assist;With upper extremity assist;With armrests;To chair/3-in-1 Details for Transfer Assistance: Pt requires cues for hand placement       Exercises  General Exercises - Upper Extremity Shoulder Flexion: AROM;Both;15 reps;Seated Wrist Flexion: AROM;15 reps;Seated;Left Wrist Extension: AROM;15 reps;Seated;Left Digit Composite Flexion: AROM;15 reps;Seated;Left Composite Extension: AROM;Seated;15 reps;Left Hand Exercises Forearm Supination: AROM;Left;15 reps;Seated Forearm Pronation: AROM;15 reps;Seated;Left   Balance Balance Balance Assessed: Yes Static Standing Balance Static Standing - Level of Assistance: 4: Min assist Static Standing - Comment/# of Minutes: 6 mins with min A   End of Session OT - End of Session Activity Tolerance: Patient tolerated treatment well Patient left: in chair;with call bell/phone within reach;with family/visitor present  [place], Ellard Artis M 08/22/2012, 1:25 PM

## 2012-08-22 NOTE — Progress Notes (Signed)
Clinical Social Work  CSW staffed case with CM to discuss LTAC vs SNF. CSW remains to keep SNF [facility]) updated on patient's progress. CSW sent additional clinicals to SNF via [facility]. CSW will continue to follow to assist with dc planning.  [place], [place] [phone]

## 2012-08-22 NOTE — Plan of Care (Signed)
Problem: Phase II Progression Outcomes Goal: Discharge plan established Pt tolerated OOB 3 hours in chair. Pt participated with ADL's 2L still required to maintain Sats 90% . Jevity with dye running per order  No evidence dye in penrose discharge at this time. Will continue to monitor.

## 2012-08-22 NOTE — Progress Notes (Signed)
08/22/2012 9:35 AM  Amy [name], [place] [id number]  Post-Op Day 12    Temp:  [98 F (36.7 C)-100.6 F (38.1 C)] 98 F (36.7 C) (10/23 0738) Pulse Rate:  [83-142] 94  (10/23 0738) Resp:  [19-26] 20  (10/23 0738) BP: (93-133)/(41-49) 124/45 mmHg (10/23 0738) SpO2:  [91 %-100 %] 95 % (10/23 0738),     Intake/Output Summary (Last 24 hours) at 08/22/12 0935 Last data filed at 08/22/12 0700  Gross per 24 hour  Intake 1537.5 ml  Output   1376 ml  Net  161.5 ml    Results for orders placed during the hospital encounter of 08/07/12 (from the past 24 hour(s))  GLUCOSE, CAPILLARY     Status: Abnormal   Collection Time   08/21/12 12:02 PM      Component Value Range   Glucose-Capillary 141 (*) 70 - 99 mg/dL  GLUCOSE, CAPILLARY     Status: Abnormal   Collection Time   08/21/12  4:43 PM      Component Value Range   Glucose-Capillary 145 (*) 70 - 99 mg/dL  GLUCOSE, CAPILLARY     Status: Abnormal   Collection Time   08/21/12  8:06 PM      Component Value Range   Glucose-Capillary 103 (*) 70 - 99 mg/dL  GLUCOSE, CAPILLARY     Status: Abnormal   Collection Time   08/21/12 11:44 PM      Component Value Range   Glucose-Capillary 145 (*) 70 - 99 mg/dL   Comment 1 Notify RN     Comment 2 Documented in Chart    GLUCOSE, CAPILLARY     Status: Abnormal   Collection Time   08/22/12  4:02 AM      Component Value Range   Glucose-Capillary 127 (*) 70 - 99 mg/dL  CBC     Status: Abnormal   Collection Time   08/22/12  4:50 AM      Component Value Range   WBC 9.1  4.0 - 10.5 K/uL   RBC 2.93 (*) 3.87 - 5.11 MIL/uL   Hemoglobin 8.6 (*) 12.0 - 15.0 g/dL   HCT 27.4 (*) 36.0 - 46.0 %   MCV 93.5  78.0 - 100.0 fL   MCH 29.4  26.0 - 34.0 pg   MCHC 31.4  30.0 - 36.0 g/dL   RDW 15.1  11.5 - 15.5 %   Platelets 321  150 - 400 K/uL  T Prot 5.7 Pre albumin (13 OCT ) 4.3 Ba swallow still shows leak.  SUBJECTIVE:  Stronger. No chest pain.  OBJECTIVE:  still  Copious mucoid drainage from wound. No  food coloring in Tube Feeding yet.  IMPRESSION:  Poor overall nutritional status. Persistent leak. No complications  PLAN:  Recheck pre albumin. Dye in tube feeding.   [name]

## 2012-08-22 NOTE — Progress Notes (Signed)
[facility] Progress Note  TEAM 1 - Stepdown/ICU TEAM   Niger M Lamba HX:[id number] DOB: 1921/11/06 DOA: 08/07/2012 PCP: Purvis Kilts, MD  Brief narrative: 78 year old female who lives alone. She experienced a mechanical fall going up the steps at home. This was not associated with loss of consciousness, prefall chest pain palpitations or lightheadedness. Despite this the daughter reports patient had been having suboptimal blood pressures with systolics in the [id number] - the patient had been complaining of some nonspecific dizziness. The patient was transported to the hospital via EMS and she was found to be in atrial fibrillation with RVR with a ventricular response of 120. She subsequently converted back to sinus rhythm. She was initially treated at [facility]. After arrival she was found to have an impacted fracture of left hip as well as a left radial head fracture. Because of her underlying multiple medical problems including coronary artery disease and chronic kidney disease she was subsequently transported to [facility] for further monitoring and treatment. She eventually underwent surgical treatment for her fractures. Because of complaints of odynophagia a swallowing evaluation was obtained which showed no evidence of oral pharyngeal dysphagia - she continued with odynophagia so a gastroenterology consult was obtained. She subsequently underwent EGD which revealed a mucosal defect in the cervical esophagus that was either a ulceration versus a perforation. She eventually underwent an esophagram with Gastrografin that confirmed a contained perforation. Cardiothoracic surgery was consulted. They felt that the etiology to the small perforation was related to a traumatic injury that was related to intubation at time of her initial orthopedic surgery on October 9. An ENT consult was obtained as well as a CT scan of the neck and chest to rule out any significant fluid collections.  The CT did reveal extensive retropharyngeal gas dissecting upwards from the mediastinum with an associated focal perforation of the esophagus at the level of the cricoid cartilage with oral contrast extravasating into the retro-pharyngeal spacing communicating with the superior mediastinum. Subsequently the patient underwent incision and drainage of this area by ENT surgeon. Currently she is stable on parenteral nutrition in the step down unit. Team 1 assumed care of this patient on 08/14/2012.  Impression and Plan:  Fracture of left hip/radial head after fall at home: A) Status post hip hemiarthroplasty left hip by Dr Ronnie Derby 08/08/2012 B) s/p ORIF by Dr Burney Gauze 08/08/2012 Management per orthopedic team - continue OT and PT   Traumatic peri-op esophageal perforation - Mediastinitis  s/p Incision and drainage, right neck and mediastinum, transcervical approach - appreciate Cardiothoracic and ENT assistance - s/p gastrostomy tube on 08/16/2012 by General Surgery - continue empiric IV Zosyn - Repeat esophogram confirmed persistent leak- ENT continues to follow-has reordered dye to be instilled into tube feeding to help determine if tube feeding refluxing up through leak-TCTS notes that interventional radiology could convert current Malecot tube to a G-J-tube but recommendation is to not pursue at this time. ENT also documents that Penrose drain will need to remain in place until tract has formed  Acute respiratory failure with hypoxia due to Pulmonary edema, noncardiac Experienced episodic severe tachypnea, HTN, and hypoxia over the weekend (10/20) which responded well to IV lasix - due to a high risk for aspiration of secretions we broadened abx coverage to include vanc (hospital acquired pathogens) - her pulmonary status is stable at this time  Acute blood loss anemia with macrocytosis Status post transfusion of packed red blood cells this admission - Hemoglobin nadir 6.1 -  likely etiology due to  multiple recent surgical procedures - B12 sub optimal at 299 - have begun B12 repletion  Hypertension BP trend improved w/ hydralazine  Protein calorie malnutrition/hyperglycemia status post open gastrostomy tube 08/16/2012 TNA now stopped - PEG tube dependent due to strict NPO status - we'll consider initiating recommended free water if the patient continues to be hemodynamically stable   CAD, CABG if the patient continues to prove to be hemodynamically stable X 3 10/09. Myoview low risk 10/10. EF >55% 2D 6/13 Received preoperative cardiac clearance and has remained asymptomatic since admission-because of recent issue with mild pulmonary edema  a 2-D echocardiogram was completed this admission and demonstrates normal systolic function. It is suspected that the pulmonary edema was related to hypoalbumin state  Low-grade fever with persistent leukocytosis UA/cx negative- note that pt is on antibiotics -chest x-ray  demonstrated slightly worse interstitial haziness at the bases likely representing edema-x-ray left foot demonstrated no evidence of osteomyelitis or soft tissue infection - the patient's fever curve appears to be trending down at this time and her white blood cell count is normalizing   Chronic renal insufficiency, stage III (moderate), SCr 1.4 Sept 2013 Creatinine  has increased after patient was given Lasix for pulmonary symptoms over the weekend - recheck in a.m.   Foot pain Several toes on left foot are slightly reddened in appearance - uric acid level is normal - xray does not reveal any abnormalities.   PAF/SSS history of Pacemaker March 2010 (MDT) stable with sinus rhythm and sinus tachycardia  Hyperlipidemia, statin intol. NPO  Aortic valve sclerosis, no stenosis  Carotid bruit present bilat, dopplers OK 6/13 Neurologically stable postop  History of UTI - recent completion 10 days Cipro Urinalysis at presentation negative for infectious process - repeat UA ordered  today   Hx of dizziness Initial fall reported as mechanical so no indication to pursue syncope work - dizziness could be rate issue in setting of afib  DVT after previous hip surgery SCDs  DVT prophylaxis: SCDs Code Status: DO NOT RESUSCITATE Family Communication: Spoke with patient's daughter at bedside  Disposition Plan: Remain in stepdown  Consultants: Orthopedics Cardiothoracic Gastroenterology ENT Gen Surgery  Procedures: ORIF distal left radial fracture with carpal tunnel release by Dr. Burney Gauze and left arthroplasty bipolar hip on 08/08/2012 by Dr. Ronnie Derby  EGD 08/10/2012 by Dr. Benson Norway  Incision and drainage of right neck and mediastinum with a transcervical approach as well as cervical esophagoscopy on [id number] by Dr. Erik Obey  Status post open gastrostomy tube placement with #24 Malecot tube by Dr. Grandville Silos 08/16/2012  Antibiotics: Zosyn 10/11 >>> Vancomycin 10/20>>  HPI/Subjective: Patient is much more alert today.  She reports the pain in her feet has improved significantly.  She denies abdominal pain.    Objective: Blood pressure 124/45, pulse 94, temperature 98 F (36.7 C), temperature source Oral, resp. rate 20, height 5\' 5"  (1.651 m), weight 75.5 kg (166 lb 7.2 oz), SpO2 95.00%.  Intake/Output Summary (Last 24 hours) at 08/22/12 1100 Last data filed at 08/22/12 1100  Gross per 24 hour  Intake 1702.5 ml  Output   1777 ml  Net  -74.5 ml     Exam: General: no acute resp distress at this time  ENT: Neck incisions covered by dressing which is clean dry and intact Lungs: poor air movement in bases B - no wheeze  Cardiovascular: RRR - no gallup or rub  Abdomen: Mildly tender over her abdominal surgical incision, nondistended, soft, bowel sounds  positive, no rebound, no ascites, no appreciable mass, abdominal dressing is clean dry and intact Musculoskeletal: No significant cyanosis or clubbing of bilateral lower extremities - no erythema or calor of B feet -  2+ DP pulsed B LE Neurological: Awake and appropriate. No appreciable neurological deficit  Data Reviewed: Basic Metabolic Panel:  Lab 0000000 0430 08/17/12 0450 08/16/12 0500  NA 139 142 142  K 3.7 3.9 3.5  CL 97 100 98  CO2 39* 38* 41*  GLUCOSE 127* 158* 152*  BUN 47* 37* 37*  CREATININE 1.20* 0.87 0.88  CALCIUM 9.1 9.2 9.6  MG -- 1.8 1.9  PHOS -- 3.4 3.1   Liver Function Tests:  Lab 08/16/12 0500  AST 36  ALT 28  ALKPHOS 256*  BILITOT 1.0  PROT 5.7*  ALBUMIN 2.1*   CBC:  Lab 08/22/12 0450 08/21/12 0425 08/20/12 0430 08/19/12 0430 08/18/12 0500  WBC 9.1 9.8 12.1* 11.2* 9.9  NEUTROABS -- -- 9.8* -- --  HGB 8.6* 8.9* 9.2* 9.3* 9.3*  HCT 27.4* 28.3* 29.4* 29.6* 29.9*  MCV 93.5 92.2 91.6 91.4 92.3  PLT 321 347 312 302 274   CBG:  Lab 08/22/12 0844 08/22/12 0402 08/21/12 2344 08/21/12 2006 08/21/12 1643  GLUCAP 134* 127* 145* 103* 145*    Recent Results (from the past 240 hour(s))  URINE CULTURE     Status: Normal   Collection Time   08/20/12  5:56 PM      Component Value Range Status Comment   Specimen Description URINE, CLEAN CATCH   Final    Special Requests NONE   Final    Culture  Setup Time 08/20/2012 19:03   Final    Colony Count NO GROWTH   Final    Culture NO GROWTH   Final    Report Status 08/21/2012 FINAL   Final      Studies:  Recent x-ray studies have been reviewed in detail by the Attending Physician  Scheduled Meds:  Reviewed in detail by the Attending Physician   Erin Hearing, ANP [facility] Office  [phone] Pager [phone]  On-Call/Text Page:      [email or web address]      password [email or web address]  08/22/2012, 11:00 AM   LOS: 15 days   I have personally examined this patient and reviewed the entire database. I have reviewed the above note, made any necessary editorial changes, and agree with its content.  Cherene Altes, MD [facility]

## 2012-08-23 ENCOUNTER — Inpatient Hospital Stay
Admission: AD | Admit: 2012-08-23 | Discharge: 2012-09-26 | Disposition: A | Discharge status: Skilled Nursing Facility | Payer: Self-pay | Source: Ambulatory Visit | Attending: Internal Medicine | Admitting: Internal Medicine

## 2012-08-23 DIAGNOSIS — I10 Essential (primary) hypertension: Secondary | ICD-10-CM | POA: Diagnosis not present

## 2012-08-23 DIAGNOSIS — J853 Abscess of mediastinum: Secondary | ICD-10-CM | POA: Diagnosis present

## 2012-08-23 DIAGNOSIS — Z6826 Body mass index (BMI) 26.0-26.9, adult: Secondary | ICD-10-CM | POA: Diagnosis not present

## 2012-08-23 DIAGNOSIS — T85698A Other mechanical complication of other specified internal prosthetic devices, implants and grafts, initial encounter: Secondary | ICD-10-CM | POA: Diagnosis not present

## 2012-08-23 DIAGNOSIS — R0602 Shortness of breath: Secondary | ICD-10-CM | POA: Diagnosis not present

## 2012-08-23 DIAGNOSIS — IMO0002 Reserved for concepts with insufficient information to code with codable children: Secondary | ICD-10-CM | POA: Diagnosis not present

## 2012-08-23 DIAGNOSIS — K219 Gastro-esophageal reflux disease without esophagitis: Secondary | ICD-10-CM | POA: Diagnosis not present

## 2012-08-23 DIAGNOSIS — K223 Perforation of esophagus: Secondary | ICD-10-CM | POA: Diagnosis not present

## 2012-08-23 DIAGNOSIS — R1319 Other dysphagia: Secondary | ICD-10-CM | POA: Diagnosis present

## 2012-08-23 DIAGNOSIS — D62 Acute posthemorrhagic anemia: Secondary | ICD-10-CM | POA: Diagnosis not present

## 2012-08-23 DIAGNOSIS — B370 Candidal stomatitis: Secondary | ICD-10-CM | POA: Diagnosis not present

## 2012-08-23 DIAGNOSIS — Z5189 Encounter for other specified aftercare: Secondary | ICD-10-CM | POA: Diagnosis not present

## 2012-08-23 DIAGNOSIS — Z951 Presence of aortocoronary bypass graft: Secondary | ICD-10-CM | POA: Diagnosis not present

## 2012-08-23 DIAGNOSIS — I4891 Unspecified atrial fibrillation: Secondary | ICD-10-CM | POA: Diagnosis present

## 2012-08-23 DIAGNOSIS — J9600 Acute respiratory failure, unspecified whether with hypoxia or hypercapnia: Secondary | ICD-10-CM | POA: Diagnosis not present

## 2012-08-23 DIAGNOSIS — J9819 Other pulmonary collapse: Secondary | ICD-10-CM | POA: Diagnosis not present

## 2012-08-23 DIAGNOSIS — M109 Gout, unspecified: Secondary | ICD-10-CM | POA: Diagnosis present

## 2012-08-23 DIAGNOSIS — J9851 Mediastinitis: Secondary | ICD-10-CM | POA: Diagnosis not present

## 2012-08-23 DIAGNOSIS — I509 Heart failure, unspecified: Secondary | ICD-10-CM | POA: Diagnosis not present

## 2012-08-23 DIAGNOSIS — Z86718 Personal history of other venous thrombosis and embolism: Secondary | ICD-10-CM | POA: Diagnosis not present

## 2012-08-23 DIAGNOSIS — Z4682 Encounter for fitting and adjustment of non-vascular catheter: Secondary | ICD-10-CM | POA: Diagnosis not present

## 2012-08-23 DIAGNOSIS — N183 Chronic kidney disease, stage 3 unspecified: Secondary | ICD-10-CM | POA: Diagnosis present

## 2012-08-23 DIAGNOSIS — S72009D Fracture of unspecified part of neck of unspecified femur, subsequent encounter for closed fracture with routine healing: Secondary | ICD-10-CM | POA: Diagnosis not present

## 2012-08-23 DIAGNOSIS — I2510 Atherosclerotic heart disease of native coronary artery without angina pectoris: Secondary | ICD-10-CM | POA: Diagnosis present

## 2012-08-23 DIAGNOSIS — S72009A Fracture of unspecified part of neck of unspecified femur, initial encounter for closed fracture: Secondary | ICD-10-CM | POA: Diagnosis not present

## 2012-08-23 DIAGNOSIS — E870 Hyperosmolality and hypernatremia: Secondary | ICD-10-CM | POA: Diagnosis not present

## 2012-08-23 DIAGNOSIS — E46 Unspecified protein-calorie malnutrition: Secondary | ICD-10-CM | POA: Diagnosis not present

## 2012-08-23 DIAGNOSIS — E785 Hyperlipidemia, unspecified: Secondary | ICD-10-CM | POA: Diagnosis present

## 2012-08-23 DIAGNOSIS — J90 Pleural effusion, not elsewhere classified: Secondary | ICD-10-CM | POA: Diagnosis not present

## 2012-08-23 DIAGNOSIS — R141 Gas pain: Secondary | ICD-10-CM | POA: Diagnosis not present

## 2012-08-23 DIAGNOSIS — D500 Iron deficiency anemia secondary to blood loss (chronic): Secondary | ICD-10-CM | POA: Diagnosis present

## 2012-08-23 DIAGNOSIS — N179 Acute kidney failure, unspecified: Secondary | ICD-10-CM | POA: Diagnosis not present

## 2012-08-23 DIAGNOSIS — I129 Hypertensive chronic kidney disease with stage 1 through stage 4 chronic kidney disease, or unspecified chronic kidney disease: Secondary | ICD-10-CM | POA: Diagnosis present

## 2012-08-23 DIAGNOSIS — I5030 Unspecified diastolic (congestive) heart failure: Secondary | ICD-10-CM | POA: Diagnosis present

## 2012-08-23 DIAGNOSIS — Z96649 Presence of unspecified artificial hip joint: Secondary | ICD-10-CM | POA: Diagnosis not present

## 2012-08-23 DIAGNOSIS — K209 Esophagitis, unspecified without bleeding: Secondary | ICD-10-CM | POA: Diagnosis not present

## 2012-08-23 DIAGNOSIS — E440 Moderate protein-calorie malnutrition: Secondary | ICD-10-CM | POA: Diagnosis not present

## 2012-08-23 LAB — CBC
Hemoglobin: 8.6 g/dL — ABNORMAL LOW (ref 12.0–15.0)
MCHC: 30.4 g/dL (ref 30.0–36.0)
WBC: 7.6 10*3/uL (ref 4.0–10.5)

## 2012-08-23 LAB — GLUCOSE, CAPILLARY
Glucose-Capillary: 103 mg/dL — ABNORMAL HIGH (ref 70–99)
Glucose-Capillary: 128 mg/dL — ABNORMAL HIGH (ref 70–99)
Glucose-Capillary: 118 mg/dL — ABNORMAL HIGH (ref 70–99)

## 2012-08-23 LAB — BASIC METABOLIC PANEL
Chloride: 107 mEq/L (ref 96–112)
GFR calc Af Amer: 50 mL/min — ABNORMAL LOW (ref >90)
GFR calc non Af Amer: 43 mL/min — ABNORMAL LOW (ref >90)
Glucose, Bld: 127 mg/dL — ABNORMAL HIGH (ref 70–99)
Potassium: 3.8 mEq/L (ref 3.5–5.1)
Sodium: 151 mEq/L — ABNORMAL HIGH (ref 135–145)

## 2012-08-23 MED ORDER — COLCHICINE 0.6 MG PO TABS
0.6000 mg | ORAL_TABLET | Freq: Once | ORAL | Status: DC
  Filled 2012-08-23: qty 1

## 2012-08-23 MED ORDER — PIPERACILLIN-TAZOBACTAM 3.375 G IVPB: 3.3750 g | Freq: Three times a day (TID) | INTRAVENOUS | Status: DC

## 2012-08-23 MED ORDER — METOPROLOL TARTRATE 25 MG PO TABS: 25.0000 mg | ORAL_TABLET | Freq: Two times a day (BID) | ORAL | Status: DC

## 2012-08-23 MED ORDER — COLCHICINE 0.6 MG PO TABS
1.2000 mg | ORAL_TABLET | Freq: Once | ORAL | Status: AC
  Administered 2012-08-23: 1.2 mg via ORAL
  Filled 2012-08-23: qty 2

## 2012-08-23 MED ORDER — JEVITY 1.2 CAL PO LIQD: 1000.0000 mL | ORAL | Status: DC

## 2012-08-23 MED ORDER — PANTOPRAZOLE SODIUM 40 MG PO PACK: 40.0000 mg | PACK | Freq: Every day | ORAL | Status: DC

## 2012-08-23 MED ORDER — VANCOMYCIN HCL IN DEXTROSE 1-5 GM/200ML-% IV SOLN: 1000.0000 mg | INTRAVENOUS | Status: DC

## 2012-08-23 MED ORDER — BACITRACIN ZINC 500 UNIT/GM EX OINT: 1.0000 "application " | TOPICAL_OINTMENT | Freq: Two times a day (BID) | CUTANEOUS | Status: DC

## 2012-08-23 MED ORDER — OXYCODONE HCL 5 MG/5ML PO SOLN: 5.0000 mg | ORAL | Status: DC | PRN

## 2012-08-23 MED ORDER — COLCHICINE 0.6 MG PO TABS: 0.6000 mg | ORAL_TABLET | Freq: Once | ORAL | Status: DC

## 2012-08-23 MED ORDER — INSULIN ASPART 100 UNIT/ML ~~LOC~~ SOLN: 0.0000 [IU] | SUBCUTANEOUS | Status: DC

## 2012-08-23 MED ORDER — QUETIAPINE 12.5 MG HALF TABLET: 12.5000 mg | ORAL_TABLET | Freq: Every day | ORAL | Status: DC

## 2012-08-23 NOTE — Plan of Care (Signed)
Problem: Phase II Progression Outcomes Goal: IV changed to normal saline lock Outcome: Progressing PICC line patent and infusing.

## 2012-08-23 NOTE — Discharge Summary (Addendum)
Physician Discharge Summary  Amy [name] CT:[id number] DOB: Nov 18, 1921 DOA: 08/07/2012  PCP: Purvis Kilts, MD  Admit date: 08/07/2012 Discharge date: 08/23/2012  Time spent: 60 minutes  Recommendations for Outpatient Follow-up:  Start free water per tube if urine output is decreasing. - currently receiving tube feeds at 55 cc/hr. Resume ARB is BP tolerates Follow BUN/ Cr ratio and hypernatremia She will be followed by ENT and CT surgery at [facility].    Discharge Diagnoses:  Active diagnosis:   *Fracture of left hip after fall at home- Status post hip hemiarthroplasty left hip by Dr Ronnie Derby 08/08/2012  Closed fracture of left distal radius s/p ORIF by Dr Burney Gauze 08/08/2012  Odynophagia due to new traumatic peri-op esophageal perforation  Mediastinitis due to esophageal perforation-s/p Incision and drainage, right neck and mediastinum, transcervical approach.  Hypertension  PMH:  Hyperlipidemia, statin intol.  CAD, CABG X 3 10/09. Myoview low risk 10/10. EF >55% 2D 6/13.  Aortic valve sclerosis, no stenosis  Carotid bruit present bilat, dopplers OK 6/13  History of UTI- recent completion 10 days Cipro  Hx of dizziness  Chronic renal insufficiency, stage III (moderate), SCr 1.4 Sept 2013  PAF/SSS  Pacemaker March 2010 (MDT)  DVT after previous hip surgery  Acute blood loss anemia    Discharge Condition: stable  Diet recommendation: tube feeds- Jevity- 1.2 cal- 55cc/hr  Filed Weights   08/19/12 0550 08/20/12 0700 08/23/12 0500  Weight: 76 kg (167 lb 8.8 oz) 75.5 kg (166 lb 7.2 oz) 73.2 kg (161 lb 6 oz)   History of present illness:  78 year old female who lives alone. She experienced a mechanical fall going up the steps at home. This was not associated with loss of consciousness, prefall chest pain palpitations or lightheadedness. Despite this the daughter reports patient had been having suboptimal blood pressures with systolics in the [id number] - the  patient had been complaining of some nonspecific dizziness. The patient was transported to the hospital via EMS and she was found to be in atrial fibrillation with RVR with a ventricular response of 120. She subsequently converted back to sinus rhythm. She was initially treated at [facility]. After arrival she was found to have an impacted fracture of left hip as well as a left radial head fracture. Because of her underlying multiple medical problems including coronary artery disease and chronic kidney disease she was subsequently transported to [facility] for further monitoring and treatment.  She eventually underwent surgical treatment for her fractures.  Due to a complaint of pain with swallowing, an SLP eval and then a GI eval was requested. She subsequently underwent EGD which revealed a mucosal defect in the cervical esophagus. She eventually underwent an esophagram with Gastrografin that confirmed a contained perforation. At this point, cardiothoracic surgery was consulted. They felt that the etiology of the small perforation was related to a traumatic injury that likely occurred during intubation for her surgery on October 9.  An ENT consult was obtained as well as a CT scan of the neck and chest to rule out any significant fluid collections. The CT did reveal extensive retropharyngeal gas dissecting upwards from the mediastinum - there was an associated focal perforation of the esophagus at the level of the cricoid cartilage with oral contrast extravasating into the retro-pharyngeal spacing and communicating with the superior mediastinum. Subsequently the patient underwent incision and drainage of this area by ENT surgeon and a penrose drain has been left in place in the right upper chest  wall/ base of neck. She is currently receiving TPN while her esophagus is healing.   Hospital Course:  Fracture of left hip/radial head after fall at home:  A) Status post hip hemiarthroplasty left hip by  Dr Ronnie Derby 08/08/2012  B) s/p ORIF by Dr Burney Gauze 08/08/2012  Management per orthopedic team - continue OT and PT - pt is being transferred to [place] for further rehab.  Traumatic peri-op esophageal perforation - Mediastinitis  s/p Incision and drainage, right neck and mediastinum, transcervical approach - appreciate Cardiothoracic and ENT assistance - s/p gastrostomy tube (Malecot tube) on 08/16/2012 by General Surgery - continue empiric IV Zosyn - Repeat esophogram confirmed persistent leak- ENT continues to follow-dye ordered to the tube feed confirms that tube feed is NOT leaking into mediastinum-TCTS notes that interventional radiology could convert current Malecot tube to a G-J-tube but recommendation is to not pursue at this time. ENT also documents that Penrose drain will need to remain in place until tract has formed   Acute respiratory failure with hypoxia due to Pulmonary edema, noncardiac  She has experienced episodic severe tachypnea and hypoxia over the weekend (10/20) which responded well to IV lasix - due to a high risk for aspiration of secretions we broadened abx coverage to include vanc (started 10/10 to cover hospital acquired pathogens) - her pulmonary status has stabilized since then.   Acute blood loss anemia with macrocytosis  Status post transfusion of packed red blood cells this admission - Hemoglobin nadir was 6.1 - likely etiology due to multiple recent surgical procedures - B12 sub optimal at 299 - have begun B12 repletion   Hypertension  BP trend improved w/ hydralazine which was given IV Q 8 hrs. Will resume Metoprolol per tube on d/c - she was actually taking Toprol XL 25 mg daily therefore will recommend Metoprolol 25 BID through tube.   Protein calorie malnutrition/hyperglycemia status post open gastrostomy tube 08/16/2012  TNA now stopped - PEG tube dependent due toesophageal tear- not yet receiving free water- Per RN she is voiding about 150 cc  every 3 hrs therefore, will not add free water per tube for now. Cont to follow I and O.   CAD, CABG if the patient continues to prove to be hemodynamically stable X 3 10/09. Myoview low risk 10/10. EF >55% 2D 6/13  Received preoperative cardiac clearance and has remained asymptomatic since admission-because of recent issue with mild pulmonary edema a 2-D echocardiogram was completed this admission and demonstrates normal systolic function. It is suspected that the pulmonary edema was related to hypoalbumin state   Low-grade fever with persistent leukocytosis  UA/cx negative- possibly due to aspiration? Now improved  Gout  of Left fore-foot and toes- have given 1.2 of Clochicine at 1.30- will give second dose of 0.6 mg at 4 Pm. Cont to follow foot. Most edematous areas are her second and thrid toes- she has pain in the forefoot which is exacerbated when ambulating.   Xray was normal.   Chronic renal insufficiency, stage III (moderate), SCr 1.4 Sept 2013  Creatinine has increased after patient was given Lasix for pulmonary symptoms over the weekend -    PAF/SSS history of Pacemaker March 2010 (MDT)  stable with sinus rhythm and sinus tachycardia   Hyperlipidemia, statin intol.  NPO   Aortic valve sclerosis, no stenosis   Carotid bruit present bilat, dopplers OK 6/13  Neurologically stable postop   History of UTI - recent completion 10 days Cipro  Urinalysis at presentation  negative for infectious process - repeat UA negative as well.   Procedures: ORIF distal left radial fracture with carpal tunnel release by Dr. Burney Gauze and left arthroplasty bipolar hip on 08/08/2012 by Dr. Ronnie Derby  EGD 08/10/2012 by Dr. Benson Norway  Incision and drainage of right neck and mediastinum with a transcervical approach as well as cervical esophagoscopy on [id number] by Dr. Erik Obey  Status post open gastrostomy tube placement with #24 Malecot tube by Dr. Grandville Silos 08/16/2012   Consultations: Orthopedics    Cardiothoracic  Gastroenterology  ENT  Gen Surgery   Discharge Exam: Filed Vitals:   08/23/12 0400 08/23/12 0500 08/23/12 0812 08/23/12 1232  BP: 136/46 134/60 115/36 124/72  Pulse: 91 91 85 87  Temp: 98.5 F (36.9 C)  98.2 F (36.8 C) 98.4 F (36.9 C)  TempSrc: Oral  Oral Oral  Resp: 19 21 21 21   Height:      Weight:  73.2 kg (161 lb 6 oz)    SpO2: 97% 98% 96% 100%    General: alert, oreinted, no acute distress Cardiovascular: RRR Respiratory: decreased breath sounds at bases  Discharge Instructions  Discharge Orders    Future Orders Please Complete By Expires   Increase activity slowly          Medication List     As of 08/23/2012  2:33 PM    STOP taking these medications         aspirin 81 MG tablet      ciprofloxacin 500 MG tablet   Commonly known as: CIPRO      hydrochlorothiazide 25 MG tablet   Commonly known as: HYDRODIURIL      metoprolol succinate 25 MG 24 hr tablet   Commonly known as: TOPROL-XL      niacin 500 MG CR tablet   Commonly known as: NIASPAN      omeprazole 20 MG capsule   Commonly known as: PRILOSEC      valsartan 160 MG tablet   Commonly known as: DIOVAN      TAKE these medications         acetaminophen 500 MG tablet   Commonly known as: TYLENOL   Take 1,000 mg by mouth every 6 (six) hours as needed. Pain      bacitracin ointment   Apply 1 application topically 2 (two) times daily.      colchicine 0.6 MG tablet   Take 1 tablet (0.6 mg total) by mouth once.      enoxaparin 40 MG/0.4ML injection   Commonly known as: LOVENOX   Inject 0.4 mLs (40 mg total) into the skin every 12 (twelve) hours.      feeding supplement (JEVITY 1.2 CAL) Liqd   Place 1,000 mLs into feeding tube continuous.      fish oil-omega-3 fatty acids 1000 MG capsule   Take 4 g by mouth daily.      insulin aspart 100 UNIT/ML injection   Commonly known as: novoLOG   Inject 0-9 Units into the skin every 4 (four) hours.      oxyCODONE 5 MG/5ML  solution   Commonly known as: ROXICODONE   Place 5-10 mLs (5-10 mg total) into feeding tube every 2 (two) hours as needed.      pantoprazole sodium 40 mg/20 mL Pack   Commonly known as: PROTONIX   Place 20 mLs (40 mg total) into feeding tube daily.      piperacillin-tazobactam 3.375 GM/50ML IVPB   Commonly known as: ZOSYN   Inject 50 mLs (  3.375 g total) into the vein every 8 (eight) hours.      QUEtiapine 12.5 mg Tabs   Commonly known as: SEROQUEL   Place 0.5 tablets (12.5 mg total) into feeding tube at bedtime.      vancomycin 1 GM/200ML Soln   Commonly known as: VANCOCIN   Inject 200 mLs (1,000 mg total) into the vein daily.           Follow-up Information    Follow up with Rudean Haskell, MD. Call on 08/22/2012.   Contact information:   [place] [address] [phone]       Follow up with Rebecca Eaton, MD. [facility] office will call you )    Contact information:   [address] [place]  [phone]           The results of significant diagnostics from this hospitalization (including imaging, microbiology, ancillary and laboratory) are listed below for reference.    Significant Diagnostic Studies: Dg Chest 1 View  08/07/2012  *RADIOLOGY REPORT*  Clinical Data: Fall  CHEST - 1 VIEW  Comparison: 03/25/2012  Findings: Left subclavian sequential transvenous pacemaker leads project at right atrium and right ventricle. Upper normal heart size post CABG. Calcified tortuous aorta. Pulmonary vascularity normal. Lungs grossly clear. No pleural effusion or pneumothorax. Minimal biapical scarring. Diffuse osseous demineralization.  IMPRESSION: Post CABG and pacemaker. No acute abnormalities.   Original Report Authenticated By: Burnetta Sabin, M.D.    Dg Wrist Complete Left  08/07/2012  *RADIOLOGY REPORT*  Clinical Data: Left wrist pain and swelling post fall  LEFT WRIST - COMPLETE 3+ VIEW  Comparison:  03/07/2005  Findings: Minimally displaced ulnar styloid fracture. Comminuted distal left radial metaphyseal fracture with intra- articular extension at the radiocarpal joint. Question extension into the distal radioulnar joint as well. Dorsal tilt of the distal radial articular surface. Overlying soft tissue swelling. Minimal degenerative changes at first [facility] joint. No additional acute fracture or dislocation identified. Osseous demineralization.  IMPRESSION: Ulnar styloid fracture. Angulated mildly displaced intra-articular distal left radial metaphyseal fracture. Osseous demineralization.   Original Report Authenticated By: Burnetta Sabin, M.D.    Dg Hip Complete Left  08/07/2012  *RADIOLOGY REPORT*  Clinical Data: Left hip pain post fall  LEFT HIP - COMPLETE 2+ VIEW  Comparison: None  Findings: Minimally impacted left femoral neck fracture. Osseous mineralization appears diffusely decreased. Left hip joint space preserved. No additional fracture or dislocation identified. Symmetric SI joints. Pelvis appears intact.  IMPRESSION: Minimally impacted left femoral neck fracture.   Original Report Authenticated By: Burnetta Sabin, M.D.    Ct Head Wo Contrast  08/07/2012  *RADIOLOGY REPORT*  Clinical Data:  Fall.  CT HEAD WITHOUT CONTRAST CT CERVICAL SPINE WITHOUT CONTRAST  Technique:  Multidetector CT imaging of the head and cervical spine was performed following the standard protocol without intravenous contrast.  Multiplanar CT image reconstructions of the cervical spine were also generated.  Comparison:   None  CT HEAD  Findings: No skull fracture or intracranial hemorrhage.  No CT evidence of large acute infarct.  Mild small vessel disease type changes.  No hydrocephalus.  No intracranial mass lesion detected on this unenhanced exam.  Vascular calcifications.  IMPRESSION: No skull fracture or intracranial hemorrhage.  CT CERVICAL SPINE  Findings: No cervical spine fracture.  Dextroscoliosis and degenerative  changes.  Spinal stenosis most notable C4-5.  Biapical lung scarring.  Carotid calcifications.  IMPRESSION: No cervical  spine fracture.  Cervical spondylotic changes with spinal stenosis most notable C4- 5.   Original Report Authenticated By: Doug Sou, M.D.    Ct Soft Tissue Neck W Contrast  08/11/2012  *RADIOLOGY REPORT*  Clinical Data: Esophageal perforation.  CT NECK WITH CONTRAST  Technique:  Multidetector CT imaging of the neck was performed with intravenous contrast.  Contrast: 64mL OMNIPAQUE IOHEXOL 300 MG/ML  SOLN  Comparison: CT of the cervical spine 08/07/2012.  Findings: Limited imaging of the brain is unremarkable.  Extensive retropharyngeal gas is dissecting upwards from the mediastinum.  There is a focal perforation of the esophagus at the level of the cricoid cartilage.  Oral contrast extravasates into the retropharyngeal space and communicates with the superior mediastinum.  Edematous changes and gas are present throughout the superior mediastinum.  Soft tissue gas perforate is about the larynx and thyroid, interdigitating with the strap musculature. There is also gas extending posterior to the carotid space on the right.  Atherosclerotic calcifications present at the aortic arch and at the carotid bifurcations bilaterally.  Of note, there is medial deviation of the right internal carotid artery within the retropharyngeal space with the artery is now surrounded by gas.  The larynx appears to be intact.  Vocal cords are symmetric.  No significant adenopathy is present.  The bone windows demonstrate multilevel spondylosis.  IMPRESSION:  1.  Esophageal perforation. The perforation appears to be at the level of the cricoid cartilage 2.  Extensive retropharyngeal gas with perforation posterior to the carotid space on the right and surrounding the thyroid gland and larynx. 3.  Medial deviation of the right internal carotid artery within the retropharyngeal space is surrounded by gas. 4.   Atherosclerosis. 5.  Moderate spondylosis of the cervical spine.   Original Report Authenticated By: Resa Miner. MATTERN, M.D.    Ct Chest W Contrast  08/10/2012  *RADIOLOGY REPORT*  Clinical Data: Neck pain.  Evaluate for possible perforated esophagus.  CT CHEST WITH CONTRAST  Technique:  Multidetector CT imaging of the chest was performed following the standard protocol during bolus administration of intravenous contrast.  Contrast:  80 ml of Omnipaque-300.  Comparison: No priors.  Findings:  Mediastinum: Images 30-32 of series 5 demonstrate an apparent defect in the posterior wall of the lower cervical esophagus with a large amount of oral contrast material extending from this defect into the adjacent posterior mediastinum.  This contrast material and multiple locules of gas tract inferiorly through the mediastinum to the right side of the esophagus to the level of the aortic arch, and multiple locules of gas extend in the fat around the carina.  There is also pneumomediastinum more anteriorly throughout the middle and anterior mediastinum, as well as tracking up into the soft tissues of the neck. A tiny amount of pneumomediastinum is also noted adjacent to the distal esophagus shortly above the esophageal hiatus.  Heart size is mildly enlarged. There is no significant pericardial fluid, thickening or pericardial calcification.  A left sided pacemaker device in place with leads terminating in the right atrium and right ventricular apex.There is atherosclerosis of the thoracic aorta, the great vessels of the mediastinum and the coronary arteries, including calcified atherosclerotic plaque in the left main, left anterior descending, left circumflex and right coronary arteries. The patient is status post median sternotomy for CABG with a LIMA to the LAD.  There are also two other graft markers in place in the proximal descending thoracic aorta, but no patent bypass grafts are confidently  identified on this  non gated examination.  Lungs/Pleura: A small right pleural effusion and trace left pleural effusion, both of which are layering dependently.  Slight diffuse prominence of the interstitial markings.  No frank honeycombing or traction bronchiectasis identified to suggest underlying interstitial lung disease.  No acute consolidative airspace disease.  Mild bilateral apical pleuroparenchymal thickening, most compatible with scarring.  Upper Abdomen: Visualized portions are unremarkable.  Musculoskeletal: Sternotomy wires.  Mild deformity of several anterolateral right ribs likely reflects old healed rib fractures. There are no aggressive appearing lytic or blastic lesions noted in the visualized portions of the skeleton.  IMPRESSION: 1.  Findings are compatible with perforation of the distal cervical esophagus shortly above the level of the thoracic inlet with extravasation of oral contrast material into the posterior mediastinum, and extensive pneumomediastinum and cervical gas, as above. 2.  Small right and trace left pleural effusions layer dependently and are simple in appearance at this time. 3.  Mild cardiomegaly. 4. Atherosclerosis, including left main and three-vessel coronary artery disease.  The patient is status post CABG with a LIMA to the LAD.  Two additional graft markers are noted on the proximal ascending thoracic aorta, and no definite patent bypass grafts are seen extending from these graft markers at this time on this non gated CT examination.  These results were called by telephone on 08/10/2012 at 08:33 p.m. to Dr. Roxy Manns, who verbally acknowledged these results.   Original Report Authenticated By: Etheleen Mayhew, M.D.    Ct Cervical Spine Wo Contrast  08/07/2012  *RADIOLOGY REPORT*  Clinical Data:  Fall.  CT HEAD WITHOUT CONTRAST CT CERVICAL SPINE WITHOUT CONTRAST  Technique:  Multidetector CT imaging of the head and cervical spine was performed following the standard protocol without  intravenous contrast.  Multiplanar CT image reconstructions of the cervical spine were also generated.  Comparison:   None  CT HEAD  Findings: No skull fracture or intracranial hemorrhage.  No CT evidence of large acute infarct.  Mild small vessel disease type changes.  No hydrocephalus.  No intracranial mass lesion detected on this unenhanced exam.  Vascular calcifications.  IMPRESSION: No skull fracture or intracranial hemorrhage.  CT CERVICAL SPINE  Findings: No cervical spine fracture.  Dextroscoliosis and degenerative changes.  Spinal stenosis most notable C4-5.  Biapical lung scarring.  Carotid calcifications.  IMPRESSION: No cervical spine fracture.  Cervical spondylotic changes with spinal stenosis most notable C4- 5.   Original Report Authenticated By: Doug Sou, M.D.    Dg Chest Port 1 View  08/20/2012  *RADIOLOGY REPORT*  Clinical Data: Right upper chest pain and shortness of breath.  PORTABLE CHEST - 1 VIEW  Comparison: 08/18/2012.  Findings: Slightly greater interstitial haziness in the lung bases compared to the previous study.  Questionable small effusions, stable.  The mid and upper lung fields remain clear.  There have been no other interval changes.  Status post CABG with dual chamber cardiac pacing wires and mild cardiomegaly.  Right PICC line tip is just above the cavoatrial junction.  IMPRESSION: Slightly worse interstitial haziness at the bases likely representing edema.  No other interval changes.   Original Report Authenticated By: [name], M.D.    Dg Chest Port 1 View  08/18/2012  *RADIOLOGY REPORT*  Clinical Data: Tachypnea, shortness of breath  PORTABLE CHEST - 1 VIEW  Comparison: 08/17/2012  Findings: Cardiomegaly with suspected mild interstitial edema. Mild patchy left lower lobe opacity, likely a combination of atelectasis and small pleural  effusion.  Postsurgical changes related to prior CABG.  Left subclavian pacemaker.  IMPRESSION: Cardiomegaly with  suspected mild interstitial edema and small left pleural effusion.  Additional patchy left lower lobe opacity, likely atelectasis.   Original Report Authenticated By: Julian Hy, M.D.    Dg Chest Port 1 View  08/17/2012  *RADIOLOGY REPORT*  Clinical Data: Chest pain, shortness of breath, pulmonary edema  PORTABLE CHEST - 1 VIEW  Comparison: 08/12/2012; 08/11/2012; chest CT of 08/10/2012  Findings:  Grossly unchanged cardiac silhouette and mediastinal contours post median sternotomy and CABG.  Stable positioning of support apparatus.  The lungs remain hyperinflated.  Mild point is congestion without frank evidence of edema.  Unchanged small bilateral effusions and bibasilar opacities.  No new focal airspace opacities.  A Penrose drain overlies the right thoracic inlet. Unchanged bones.  IMPRESSION: 1.  Grossly unchanged small bilateral effusions and bibasilar opacities, atelectasis versus infiltrate. 2.  Pulmonary venous congestion without frank evidence of edema.   Original Report Authenticated By: Rachel Moulds, M.D.    Dg Chest Port 1 View  08/12/2012  *RADIOLOGY REPORT*  Clinical Data: Mediastinal abscess.  Acute blood loss.  PORTABLE CHEST - 1 VIEW  Comparison: 08/11/2012.  Findings: Patient is rotated to the right.  Interval removal of enteric tube.  CABG.  Left subclavian pacemaker apparatus unchanged.  Right upper extremity PICC also appears unchanged. Mild basilar atelectasis.  No airspace disease.  Small left pleural effusion. Cardiopericardial silhouette and mediastinal contours appear unchanged compared to prior. Pulmonary vascular congestion is present.  IMPRESSION: Development of bilateral basilar atelectasis and a small left pleural effusion. Findings suggest volume overload.  Unchanged Penrose drain in the right thoracic inlet/neck base.   Original Report Authenticated By: Dereck Ligas, M.D.    Dg Chest Port 1 View  08/11/2012  *RADIOLOGY REPORT*  Clinical Data: Line placement   PORTABLE CHEST - 1 VIEW  Comparison: 08/08/2012  Findings: There is a right-sided line entering the superior vena cava by subclavian approach.  The tip is at the cavoatrial junction.  There is no pneumothorax.  There is mild hazy density at the left lung base possibly representing atelectasis.  The lungs are otherwise clear.  An NG tube has been placed and just crosses the gastroesophageal junction.  Cardiac pacer is stable.  Heart size is normal.  IMPRESSION: NG tube and right central line placement.   Original Report Authenticated By: Rachael Fee, M.D.    Dg Chest Port 1 View  08/08/2012  *RADIOLOGY REPORT*  Clinical Data: Preoperative study.  Shortness of breath.  PORTABLE CHEST - 1 VIEW  Comparison: Chest x-ray 08/07/2012.  Findings: The lung volumes are normal.  No consolidative airspace disease.  No definite pleural effusions.  Bilateral apical pleuroparenchymal thickening, similar to prior examinations, most compatible with scarring.  Prominence of the central pulmonary arteries could suggest pulmonary arterial hypertension. Mild pulmonary venous congestion without frank pulmonary edema.  Heart size is mildly enlarged (unchanged). The patient is rotated to the right on today's exam, resulting in distortion of the mediastinal contours and reduced diagnostic sensitivity and specificity for mediastinal pathology.  Atherosclerotic calcifications are noted within the arch of the aorta.  Left-sided pacemaker device in place with lead tips projecting over the expected location of the right atrium and right ventricular apex.  IMPRESSION: 1.  Cardiomegaly with mild pulmonary venous congestion, but no frank pulmonary edema. 2.  In addition, there is dilatation of the central pulmonary arteries, which could suggest pulmonary  arterial hypertension. 3.  Atherosclerosis. 4.  Postoperative changes and support apparatus, as above.   Original Report Authenticated By: Etheleen Mayhew, M.D.    Dg Shoulder  Left  08/08/2012  *RADIOLOGY REPORT*  Clinical Data: Pain.  Recent injury.  LEFT SHOULDER - 2+ VIEW  Comparison: None.  Findings: No evidence of fracture or dislocation.  No significant degenerative change of the glenohumeral joint.  Ordinary osteoarthritis of the AC joint.  Pacemaker overlies the region. There are old appearing left rib fractures, not primarily evaluated.  IMPRESSION: No evidence of fracture or dislocation.  Osteoarthritis of the [facility] joint.   Original Report Authenticated By: Jules Schick, M.D.    Dg Foot 2 Views Left  08/20/2012  *RADIOLOGY REPORT*  Clinical Data: Left foot pain.  LEFT FOOT - 2 VIEW  Comparison: 03/09/2011  Findings: Two views of the foot were obtained.  No evidence for a fracture or dislocation.  Normal alignment of the left foot.  Mild degenerative changes at the first MTP joint.  IMPRESSION: No acute bony abnormality involving the left foot.   Original Report Authenticated By: Markus Daft, M.D.    Dg Foot Complete Right  08/09/2012  *RADIOLOGY REPORT*  Clinical Data: Right foot pain.  Postop surgery for fracture left hip and left radial fracture.  RIGHT FOOT COMPLETE - 3+ VIEW  Comparison: Radiographs 08/06/2011.  Findings: There is motion on the AP view.  Mild osteopenia is noted.  The previously noted fracture involving the base of the fifth metatarsal has healed.  There is no evidence of acute fracture or dislocation.  There are stable degenerative changes of the first metatarsal phalangeal joint.  The tibial sesamoid of the first metatarsal is bipartite.  IMPRESSION: No acute osseous findings.   Original Report Authenticated By: Vivia Ewing, M.D.    Dg Esophagus W/water Sol Cm  08/20/2012  *RADIOLOGY REPORT*  Clinical Data: History of perforation of the esophagus.  ESOPHOGRAM/BARIUM SWALLOW  Technique:  Single contrast examination was performed using water soluble contrast.  Fluoroscopy time:  1.16 minutes.  Comparison:  08/10/2012.  Findings:   Fluoroscopic images were obtained during multiple swallow attempts with water soluble contrast material (Omnipaque- 300).  These demonstrated persistent perforation of the lower cervical esophagus and leakage of oral contrast material into the posterior mediastinum, extending inferiorly to the level of the aortic arch.  IMPRESSION: 1.  Study is positive for persistent esophageal perforation, as above.  These results were called by telephone on 08/20/2012 at 12:22 p.m. to Dr. Thereasa Solo, who verbally acknowledged these results.   Original Report Authenticated By: Etheleen Mayhew, M.D.    Dg Esophagus W/water Sol Cm  08/10/2012  *RADIOLOGY REPORT*  Clinical Data: Elenora Gamma with ulceration or perforation of the esophagus just below the upper esophageal sphincter on endoscopy today.  ESOPHOGRAM/BARIUM SWALLOW  Technique:  Single contrast examination was performed using water soluble contrast (Omnipaque-300).  Fluoroscopy time:  1.1 minutes.  Comparison:  Chest radiographs 08/08/2012.  Chest CT 08/15/2008.  Findings:  The patient has limited mobility due to a recent left hip fracture.  The study was initiated with the patient in the right posterior oblique position. The cervical esophagus appears normal.  Just below the thoracic inlet, there is focal extravasation of contrast into the right superior mediastinum. With additional swallowing in the supine position, this became better defined, but appears contained.  There is no extravasation into the pleural space.  The distal esophagus was incompletely evaluated but appears grossly normal.  Some contrast enters the stomach.  IMPRESSION: Contained perforation of the proximal thoracic esophagus with extraluminal contrast collection in the superior right mediastinum. In review of the prior chest CT, there was no evidence of underlying esophageal diverticulum in this area.  Results have been reviewed with Dr. Benson Norway shortly after the procedure.   Original Report  Authenticated By: Vivia Ewing, M.D.     Microbiology: Recent Results (from the past 240 hour(s))  URINE CULTURE     Status: Normal   Collection Time   08/20/12  5:56 PM      Component Value Range Status Comment   Specimen Description URINE, CLEAN CATCH   Final    Special Requests NONE   Final    Culture  Setup Time 08/20/2012 19:03   Final    Colony Count NO GROWTH   Final    Culture NO GROWTH   Final    Report Status 08/21/2012 FINAL   Final      Labs: Basic Metabolic Panel:  Lab 0000000 0500 08/20/12 0430 08/17/12 0450  NA 151* 139 142  K 3.8 3.7 3.9  CL 107 97 100  CO2 38* 39* 38*  GLUCOSE 127* 127* 158*  BUN 42* 47* 37*  CREATININE 1.11* 1.20* 0.87  CALCIUM 9.1 9.1 9.2  MG -- -- 1.8  PHOS -- -- 3.4   Liver Function Tests: No results found for this basename: AST:5,ALT:5,ALKPHOS:5,BILITOT:5,PROT:5,ALBUMIN:5 in the last 168 hours No results found for this basename: LIPASE:5,AMYLASE:5 in the last 168 hours No results found for this basename: AMMONIA:5 in the last 168 hours CBC:  Lab 08/23/12 0500 08/22/12 0450 08/21/12 0425 08/20/12 0430 08/19/12 0430  WBC 7.6 9.1 9.8 12.1* 11.2*  NEUTROABS -- -- -- 9.8* --  HGB 8.6* 8.6* 8.9* 9.2* 9.3*  HCT 28.3* 27.4* 28.3* 29.4* 29.6*  MCV 95.0 93.5 92.2 91.6 91.4  PLT 327 321 347 312 302   Cardiac Enzymes: No results found for this basename: CKTOTAL:5,CKMB:5,CKMBINDEX:5,TROPONINI:5 in the last 168 hours BNP: BNP (last 3 results) No results found for this basename: PROBNP:3 in the last 8760 hours CBG:  Lab 08/23/12 1222 08/23/12 0759 08/23/12 0511 08/23/12 0011 08/22/12 2106  GLUCAP 102* 118* 128* 103* 138*       Signed:  [place]  [facility] 08/23/2012, 2:33 PM

## 2012-08-23 NOTE — Progress Notes (Signed)
Pt tolerated 3 hours OOB in chair. C/O pain in left foot. Colchicine started per order. Family aware of transfer plans. Report called to [facility] at 15:15.

## 2012-08-23 NOTE — Progress Notes (Signed)
08/23/2012 10:16 AM  Amy [name], Amy [name] [id number]  Post-Op Day 13    Temp:  [97.3 F (36.3 C)-98.5 F (36.9 C)] 98.2 F (36.8 C) (10/24 [id number]) Pulse Rate:  [62-94] 85  (10/24 0812) Resp:  [15-23] 21  (10/24 0812) BP: (100-143)/(36-69) 115/36 mmHg (10/24 0812) SpO2:  [96 %-100 %] 96 % (10/24 0812) Weight:  [73.2 kg (161 lb 6 oz)] 73.2 kg (161 lb 6 oz) (10/24 0500),     Intake/Output Summary (Last 24 hours) at 08/23/12 1016 Last data filed at 08/23/12 0900  Gross per 24 hour  Intake 1632.5 ml  Output   1100 ml  Net  532.5 ml    Results for orders placed during the hospital encounter of 08/07/12 (from the past 24 hour(s))  GLUCOSE, CAPILLARY     Status: Abnormal   Collection Time   08/22/12 12:53 PM      Component Value Range   Glucose-Capillary 143 (*) 70 - 99 mg/dL   Comment 1 Notify RN    GLUCOSE, CAPILLARY     Status: Abnormal   Collection Time   08/22/12  5:18 PM      Component Value Range   Glucose-Capillary 130 (*) 70 - 99 mg/dL   Comment 1 Notify RN    GLUCOSE, CAPILLARY     Status: Abnormal   Collection Time   08/22/12  9:06 PM      Component Value Range   Glucose-Capillary 138 (*) 70 - 99 mg/dL  GLUCOSE, CAPILLARY     Status: Abnormal   Collection Time   08/23/12 12:11 AM      Component Value Range   Glucose-Capillary 103 (*) 70 - 99 mg/dL  CBC     Status: Abnormal   Collection Time   08/23/12  5:00 AM      Component Value Range   WBC 7.6  4.0 - 10.5 K/uL   RBC 2.98 (*) 3.87 - 5.11 MIL/uL   Hemoglobin 8.6 (*) 12.0 - 15.0 g/dL   HCT 28.3 (*) 36.0 - 46.0 %   MCV 95.0  78.0 - 100.0 fL   MCH 28.9  26.0 - 34.0 pg   MCHC 30.4  30.0 - 36.0 g/dL   RDW 15.0  11.5 - 15.5 %   Platelets 327  150 - 400 K/uL  BASIC METABOLIC PANEL     Status: Abnormal   Collection Time   08/23/12  5:00 AM      Component Value Range   Sodium 151 (*) 135 - 145 mEq/L   Potassium 3.8  3.5 - 5.1 mEq/L   Chloride 107  96 - 112 mEq/L   CO2 38 (*) 19 - 32 mEq/L   Glucose, Bld 127  (*) 70 - 99 mg/dL   BUN 42 (*) 6 - 23 mg/dL   Creatinine, Ser 1.11 (*) 0.50 - 1.10 mg/dL   Calcium 9.1  8.4 - 10.5 mg/dL   GFR calc non Af Amer 43 (*) >90 mL/min   GFR calc Af Amer 50 (*) >90 mL/min  GLUCOSE, CAPILLARY     Status: Abnormal   Collection Time   08/23/12  5:11 AM      Component Value Range   Glucose-Capillary 128 (*) 70 - 99 mg/dL  GLUCOSE, CAPILLARY     Status: Abnormal   Collection Time   08/23/12  7:59 AM      Component Value Range   Glucose-Capillary 118 (*) 70 - 99 mg/dL    SUBJECTIVE:  Nurses note no  blue coloration on neck drainage.  OBJECTIVE:  Relatively little drainage last 5 hrs.  IMPRESSION:  Satisfactory check.  No obvious refluxing of stomach contents.  PLAN:  One more bottle of TF with blue dye.  Await pre albumin check. [name]

## 2012-08-23 NOTE — Progress Notes (Signed)
Clinical Social Work  CSW reviewed chart which stated patient will dc to [facility] today. CSW is signing off but available if needed.  [place], [place] [phone]

## 2012-08-23 NOTE — Progress Notes (Signed)
**Late entry for 08/22/12**  PT Progress Note:      08/22/12 1500  PT Visit Information  Last PT Received On 08/22/12  Assistance Needed +1  PT Time Calculation  PT Start Time 1015  PT Stop Time 1040  PT Time Calculation (min) 25 min  Precautions  Precautions Posterior Hip;Fall  Precaution Comments Pt able to verbalize 2/3 precautions.  Re-educated pt on all 3.    Required Braces or Orthoses Other Brace/Splint  Other Brace/Splint L wrist splint  Restrictions  Weight Bearing Restrictions Yes  LUE Weight Bearing WBAT  LLE Weight Bearing WBAT  Cognition  Overall Cognitive Status Appears within functional limits for tasks assessed/performed  Arousal/Alertness Awake/alert  Orientation Level Appears intact for tasks assessed  Behavior During Session [facility] for tasks performed  Bed Mobility  Bed Mobility Supine to Sit;Sitting - Scoot to Edge of Bed  Supine to Sit 1: +2 Total assist;HOB flat  Supine to Sit: Patient Percentage 50%  Sitting - Scoot to Edge of Bed 3: Mod assist  Details for Bed Mobility Assistance cues for technique.  (A) for LE's & to lift shoulders/trunk sitting upright.    Transfers  Transfers Sit to Stand;Stand to Sit  Sit to Stand With upper extremity assist;With armrests;From chair/3-in-1;From bed;3: Mod assist  Stand to Sit With upper extremity assist;With armrests;To chair/3-in-1;3: Mod assist  Details for Transfer Assistance Pt demonstrates safe hand placement.  (A) to achieve standing, balance, & controlled descent.  Pt begins with LUE already on platform & pushes up with RUE.    Ambulation/Gait  Ambulation/Gait Assistance 4: Min assist  Ambulation Distance (Feet) 50 Feet  Assistive device Left platform walker;Rolling walker  Ambulation/Gait Assistance Details Cues for sequencing & tall posture.  Fatigues quickly.  SOB noted but 02 sats at 94% RA immediatly after ambulation  Gait Pattern Step-to pattern;Decreased step length - right;Decreased stance time -  left;Decreased hip/knee flexion - left  Wheelchair Mobility  Wheelchair Mobility No  Balance  Balance Assessed No  PT - End of Session  Equipment Utilized During Treatment Gait belt  Activity Tolerance Patient tolerated treatment well;Patient limited by fatigue  Patient left in chair;with call bell/phone within reach;with family/visitor present  Nurse Communication Mobility status;Other (comment) (02 sats)  PT - Assessment/Plan  Comments on Treatment Session Pt very pleasant & willing to participate despite being tired.  Trialed session without supplemental 02.  Pt's 02 sats remained >90% but pt was noticeably SOB.  Pt states fatigue was biggest limiting factor in mobility today.    PT Plan Discharge plan remains appropriate;Frequency remains appropriate  PT Frequency Min 4X/week  Follow Up Recommendations Post acute inpatient  Equipment Recommended None recommended by PT;None recommended by OT  Acute Rehab PT Goals  Time For Goal Achievement 08/25/12  Potential to Achieve Goals Fair  Pt will go Supine/Side to Sit with min assist  PT Goal: Supine/Side to Sit - Progress Progressing toward goal  Pt will go Sit to Supine/Side with +2 total assist  Pt will go Sit to Stand with min assist  PT Goal: Sit to Stand - Progress Progressing toward goal  Pt will go Stand to Sit with min assist  PT Goal: Stand to Sit - Progress Progressing toward goal  Pt will Transfer Bed to Chair/Chair to Bed with modified independence  Pt will Ambulate 16 - 50 feet;with mod assist;with least restrictive assistive device  PT Goal: Ambulate - Progress Met  Pt will Perform Home Exercise Program with supervision, verbal cues  required/provided  PT General Charges  $$ ACUTE PT VISIT 1 Procedure  PT Treatments  $Gait Training 8-22 mins  $Therapeutic Activity 8-22 mins     Sarajane Marek, [place] [phone] 08/23/2012

## 2012-08-24 LAB — COMPREHENSIVE METABOLIC PANEL
ALT: 31 U/L (ref 0–35)
AST: 30 U/L (ref 0–37)
Calcium: 9.1 mg/dL (ref 8.4–10.5)
Creatinine, Ser: 0.9 mg/dL (ref 0.50–1.10)
Sodium: 147 mEq/L — ABNORMAL HIGH (ref 135–145)
Total Protein: 6.1 g/dL (ref 6.0–8.3)

## 2012-08-24 LAB — PHOSPHORUS: Phosphorus: 2.7 mg/dL (ref 2.3–4.6)

## 2012-08-24 LAB — PREALBUMIN: Prealbumin: 12.7 mg/dL — ABNORMAL LOW (ref 17.0–34.0)

## 2012-08-24 LAB — CBC WITH DIFFERENTIAL/PLATELET
Basophils Absolute: 0 10*3/uL (ref 0.0–0.1)
Basophils Relative: 1 % (ref 0–1)
Eosinophils Absolute: 0.2 10*3/uL (ref 0.0–0.7)
Eosinophils Relative: 3 % (ref 0–5)
HCT: 28.9 % — ABNORMAL LOW (ref 36.0–46.0)
MCH: 28.7 pg (ref 26.0–34.0)
MCHC: 30.4 g/dL (ref 30.0–36.0)
MCV: 94.1 fL (ref 78.0–100.0)
Monocytes Absolute: 0.4 10*3/uL (ref 0.1–1.0)
Platelets: 338 10*3/uL (ref 150–400)
RDW: 14.9 % (ref 11.5–15.5)
WBC: 7.8 10*3/uL (ref 4.0–10.5)

## 2012-08-24 LAB — HEMOGLOBIN A1C: Hgb A1c MFr Bld: 5.9 % — ABNORMAL HIGH (ref <5.7)

## 2012-08-24 LAB — TSH: TSH: 5.516 u[IU]/mL — ABNORMAL HIGH (ref 0.350–4.500)

## 2012-08-24 LAB — VITAMIN B12: Vitamin B-12: 1721 pg/mL — ABNORMAL HIGH (ref 211–911)

## 2012-08-25 LAB — BASIC METABOLIC PANEL
BUN: 42 mg/dL — ABNORMAL HIGH (ref 6–23)
Chloride: 109 mEq/L (ref 96–112)
Creatinine, Ser: 1.23 mg/dL — ABNORMAL HIGH (ref 0.50–1.10)
GFR calc Af Amer: 44 mL/min — ABNORMAL LOW (ref >90)
GFR calc non Af Amer: 38 mL/min — ABNORMAL LOW (ref >90)
Glucose, Bld: 122 mg/dL — ABNORMAL HIGH (ref 70–99)
Potassium: 4.2 mEq/L (ref 3.5–5.1)

## 2012-08-26 LAB — CBC
HCT: 27.3 % — ABNORMAL LOW (ref 36.0–46.0)
MCH: 28.8 pg (ref 26.0–34.0)
MCV: 95.8 fL (ref 78.0–100.0)
Platelets: 275 10*3/uL (ref 150–400)
RBC: 2.85 MIL/uL — ABNORMAL LOW (ref 3.87–5.11)
WBC: 7.6 10*3/uL (ref 4.0–10.5)

## 2012-08-26 LAB — BASIC METABOLIC PANEL
BUN: 40 mg/dL — ABNORMAL HIGH (ref 6–23)
CO2: 34 mEq/L — ABNORMAL HIGH (ref 19–32)
Calcium: 9.1 mg/dL (ref 8.4–10.5)
Chloride: 107 mEq/L (ref 96–112)
Creatinine, Ser: 0.98 mg/dL (ref 0.50–1.10)

## 2012-08-27 ENCOUNTER — Other Ambulatory Visit (HOSPITAL_COMMUNITY): Payer: Self-pay

## 2012-08-27 DIAGNOSIS — J90 Pleural effusion, not elsewhere classified: Secondary | ICD-10-CM | POA: Diagnosis not present

## 2012-08-27 DIAGNOSIS — R0602 Shortness of breath: Secondary | ICD-10-CM | POA: Diagnosis not present

## 2012-08-27 DIAGNOSIS — J9819 Other pulmonary collapse: Secondary | ICD-10-CM | POA: Diagnosis not present

## 2012-08-27 LAB — CBC
HCT: 28.2 % — ABNORMAL LOW (ref 36.0–46.0)
MCH: 29.6 pg (ref 26.0–34.0)
MCHC: 31.6 g/dL (ref 30.0–36.0)
MCV: 93.7 fL (ref 78.0–100.0)
Platelets: 264 10*3/uL (ref 150–400)
RDW: 14.9 % (ref 11.5–15.5)

## 2012-08-27 LAB — BASIC METABOLIC PANEL
BUN: 33 mg/dL — ABNORMAL HIGH (ref 6–23)
Calcium: 8.9 mg/dL (ref 8.4–10.5)
Creatinine, Ser: 0.92 mg/dL (ref 0.50–1.10)
GFR calc Af Amer: 63 mL/min — ABNORMAL LOW (ref >90)
GFR calc non Af Amer: 54 mL/min — ABNORMAL LOW (ref >90)

## 2012-08-27 NOTE — H&P (Signed)
Chief Complaint: Esophageal perf Referring Physician:[place]ort HPI: Amy [name] is an 78 y.o. female with recent hx of esoph perf and now chronic patency of esophageal perf/fistula. She had an open G-tube (Malecot) placed by surgery on 10/17. Recent findings concerning that TF are refluxing up and coming out perf. Request for IR to attempt to place J-tube through existing G-tube.  Past Medical History:  Past Medical History  Diagnosis Date  . Hypertension   . Hypercholesteremia   . Myocardial infarction     Inferior STEMI 07/2008 - PCI of prox RCA followed byu CABG x 3 in 9/'09  . Pneumonia   . Anxiety   . CAD in native artery     s/p CABG x 3; Last Myoview in 07/2009 - non-ischemic; Echo 03/2012  Aortic Sclerosis with normal EF.  Marland Kitchen Asthma   . Pacemaker   . GERD (gastroesophageal reflux disease)   . Arthritis   . Status post hip hemiarthroplasty left hip by Dr Ronnie Derby 08/08/2012    Past Surgical History:  Past Surgical History  Procedure Date  . Coronary artery bypass graft 2009    LIMA-LAD, SVG-RI, SVG-stented RCA  . Total hip arthroplasty   . Pacemaker insertion   . Insert / replace / remove pacemaker   . Wrist fracture surgery 07/2012    left intra articular  with carpel tunnel  . Carpal tunnel release 08/08/2012    Procedure: CARPAL TUNNEL RELEASE;  Surgeon: Schuyler Amor, MD;  Location: [place];  Service: Orthopedics;  Laterality: Left;  . Hip arthroplasty 08/08/2012    Procedure: ARTHROPLASTY BIPOLAR HIP;  Surgeon: Rudean Haskell, MD;  Location: [place];  Service: Orthopedics;  Laterality: Left;  [place]   . Esophagogastroduodenoscopy 08/10/2012    Procedure: ESOPHAGOGASTRODUODENOSCOPY (EGD);  Surgeon: Beryle Beams, MD;  Location: [facility] ENDOSCOPY;  Service: Endoscopy;  Laterality: N/A;  . Esophagoscopy 08/10/2012    Procedure: ESOPHAGOSCOPY;  Surgeon: [name], MD;  Location: [place];  Service: ENT;  Laterality: N/A;  . Gastrostomy 08/16/2012    Procedure: GASTROSTOMY;   Surgeon: Zenovia Jarred, MD;  Location: [place];  Service: General;  Laterality: N/A;    Family History:  Family History  Problem Relation Age of Onset  . Hypertension Mother   . Diabetes Mother   . Cancer Mother   . Hypertension Father   . Stroke Father     Social History:  reports that she has never smoked. She has never used smokeless tobacco. She reports that she does not drink alcohol or use illicit drugs.  Allergies:  Allergies  Allergen Reactions  . Hydrocodone-Acetaminophen Itching    Tolerates tylenol    Medications: Medications Prior to Admission  Medication Sig Dispense Refill  . acetaminophen (TYLENOL) 500 MG tablet Take 1,000 mg by mouth every 6 (six) hours as needed. Pain      . bacitracin ointment Apply 1 application topically 2 (two) times daily.  120 g  0  . colchicine 0.6 MG tablet Take 1 tablet (0.6 mg total) by mouth once.  1 tablet  0  . enoxaparin (LOVENOX) 40 MG/0.4ML injection Inject 0.4 mLs (40 mg total) into the skin every 12 (twelve) hours.  10 Syringe  0  . fish oil-omega-3 fatty acids 1000 MG capsule Take 4 g by mouth daily.      . insulin aspart (NOVOLOG) 100 UNIT/ML injection Inject 0-9 Units into the skin every 4 (four) hours.  1 vial  0  . metoprolol tartrate (LOPRESSOR)  25 MG tablet Take 1 tablet (25 mg total) by mouth 2 (two) times daily.      . Nutritional Supplements (FEEDING SUPPLEMENT, JEVITY 1.2 CAL,) LIQD Place 1,000 mLs into feeding tube continuous.      Marland Kitchen oxyCODONE (ROXICODONE) 5 MG/5ML solution Place 5-10 mLs (5-10 mg total) into feeding tube every 2 (two) hours as needed.      . pantoprazole sodium (PROTONIX) 40 mg/20 mL PACK Place 20 mLs (40 mg total) into feeding tube daily.  30 each  0  . piperacillin-tazobactam (ZOSYN) 3.375 GM/50ML IVPB Inject 50 mLs (3.375 g total) into the vein every 8 (eight) hours.  50 mL  0  . QUEtiapine (SEROQUEL) 12.5 mg TABS Place 0.5 tablets (12.5 mg total) into feeding tube at bedtime.      . vancomycin  (VANCOCIN) 1 GM/200ML SOLN Inject 200 mLs (1,000 mg total) into the vein daily.  4000 mL  0    Please HPI for pertinent positives, otherwise complete 10 system ROS negative.  Physical Exam: There were no vitals taken for this visit. There is no height or weight on file to calculate BMI.   General Appearance:  Alert, cooperative, no distress, appears stated age  Head:  Normocephalic, without obvious abnormality, atraumatic  ENT: Unremarkable  Neck: Supple, Rt neck wd/fistula  Lungs:   Clear to auscultation bilaterally, no w/r/r, respirations unlabored without use of accessory muscles.  Heart:  Regular rate and rhythm, S1, S2 normal, no murmur, rub or gallop. Carotids 2+ without bruit.  Abdomen:   Soft. Midline wd healing well. LUQ gastrostomy looks clean, NT  Neurologic: Normal affect, no gross deficits.   Results for orders placed during the hospital encounter of 08/23/12 (from the past 48 hour(s))  CBC     Status: Abnormal   Collection Time   08/26/12  5:21 AM      Component Value Range Comment   WBC 7.6  4.0 - 10.5 K/uL    RBC 2.85 (*) 3.87 - 5.11 MIL/uL    Hemoglobin 8.2 (*) 12.0 - 15.0 g/dL    HCT 27.3 (*) 36.0 - 46.0 %    MCV 95.8  78.0 - 100.0 fL    MCH 28.8  26.0 - 34.0 pg    MCHC 30.0  30.0 - 36.0 g/dL    RDW 15.1  11.5 - 15.5 %    Platelets 275  150 - 400 K/uL   BASIC METABOLIC PANEL     Status: Abnormal   Collection Time   08/26/12  5:21 AM      Component Value Range Comment   Sodium 147 (*) 135 - 145 mEq/L    Potassium 3.7  3.5 - 5.1 mEq/L    Chloride 107  96 - 112 mEq/L    CO2 34 (*) 19 - 32 mEq/L    Glucose, Bld 99  70 - 99 mg/dL    BUN 40 (*) 6 - 23 mg/dL    Creatinine, Ser 0.98  0.50 - 1.10 mg/dL    Calcium 9.1  8.4 - 10.5 mg/dL    GFR calc non Af Amer 50 (*) >90 mL/min    GFR calc Af Amer 58 (*) >90 mL/min   CBC     Status: Abnormal   Collection Time   08/27/12  4:45 AM      Component Value Range Comment   WBC 7.9  4.0 - 10.5 K/uL    RBC 3.01 (*)  3.87 - 5.11 MIL/uL  Hemoglobin 8.9 (*) 12.0 - 15.0 g/dL    HCT 28.2 (*) 36.0 - 46.0 %    MCV 93.7  78.0 - 100.0 fL    MCH 29.6  26.0 - 34.0 pg    MCHC 31.6  30.0 - 36.0 g/dL    RDW 14.9  11.5 - 15.5 %    Platelets 264  150 - 400 K/uL   BASIC METABOLIC PANEL     Status: Abnormal   Collection Time   08/27/12  4:45 AM      Component Value Range Comment   Sodium 142  135 - 145 mEq/L    Potassium 3.6  3.5 - 5.1 mEq/L    Chloride 105  96 - 112 mEq/L    CO2 30  19 - 32 mEq/L    Glucose, Bld 84  70 - 99 mg/dL    BUN 33 (*) 6 - 23 mg/dL    Creatinine, Ser 0.92  0.50 - 1.10 mg/dL    Calcium 8.9  8.4 - 10.5 mg/dL    GFR calc non Af Amer 54 (*) >90 mL/min    GFR calc Af Amer 63 (*) >90 mL/min    Dg Chest Portable 1 View  08/27/2012  *RADIOLOGY REPORT*  Clinical Data: Short of breath.  Rule out pneumonia  PORTABLE CHEST - 1 VIEW  Comparison: No 08/20/2012  Findings: Pulmonary vascular congestion with bilateral effusions have developed since the prior study.  This is most compatible with congestive heart failure with mild edema.  Increase in mild bibasilar atelectasis. Right arm PICC tip in the SVC, unchanged.  IMPRESSION: Mild progression of heart failure.   Original Report Authenticated By: Truett Perna, M.D.     Assessment/Plan Persistent esophageal perf/controlled fistula Refluxing from G-tube feeds. Discussed with [facility]-Dr. Grandville Silos and confirmed Malecot tube in position, sideholes only, no endhole at tip of tube. Discussed with attending IR MD, this will be technically difficult to thread wire through sidehole of gastrostomy and then down to duodenum. But will attempt tomorrow. If unsuccessful, may need alt means of nutrition temporarily. Would not recommend pulling out current G-tube as it has only been in place 11 days and is pexy'd to abd wall. Discussed with daughter possibility that we may be unsuccessful. Also discussed that if we are successful, small size J-tubes are more likely  to clog. Plan to proceed tomorrow. Do not expect to need sedation but will set up for if needed. Will obtain consent for procedure Labs reviewed.  Ascencion Dike PA-C 08/27/2012, 3:48 PM

## 2012-08-27 NOTE — H&P (Signed)
Agree with PA note.  Will attempt to place a jejunal feeding tube coaxially through the existing Malecot G-tube.   Signed,  Criselda Peaches, MD Vascular & Interventional Radiologist [facility]

## 2012-08-28 ENCOUNTER — Other Ambulatory Visit (HOSPITAL_COMMUNITY): Payer: Self-pay

## 2012-08-28 DIAGNOSIS — T85698A Other mechanical complication of other specified internal prosthetic devices, implants and grafts, initial encounter: Secondary | ICD-10-CM | POA: Diagnosis not present

## 2012-08-28 LAB — URINALYSIS, ROUTINE W REFLEX MICROSCOPIC
Glucose, UA: NEGATIVE mg/dL
Leukocytes, UA: NEGATIVE
Nitrite: NEGATIVE
Specific Gravity, Urine: 1.03 (ref 1.005–1.030)
pH: 5.5 (ref 5.0–8.0)

## 2012-08-28 LAB — VANCOMYCIN, RANDOM: Vancomycin Rm: 15.9 ug/mL

## 2012-08-28 LAB — PRO B NATRIURETIC PEPTIDE: Pro B Natriuretic peptide (BNP): 5330 pg/mL — ABNORMAL HIGH (ref 0–450)

## 2012-08-28 MED ORDER — IOHEXOL 300 MG/ML  SOLN: 50.0000 mL | Freq: Once | INTRAMUSCULAR | Status: AC | PRN

## 2012-08-28 NOTE — Procedures (Signed)
Interventional Radiology Procedure Note  Procedure: Retrofit of existing surgically placed Malecot G-tube with a new 41F J-arm.  The tip of the J-arm is in the horizontal duodenum.  Complications: None Recommendations: - Use J-arm for feeds - Use existing G port for decompression PRN - If pt has reflux from duodenal feeds, we can get this tube further into the jejunum by shortening the existing G-tube.    Signed,  Criselda Peaches, MD Vascular & Interventional Radiologist [facility]

## 2012-08-29 ENCOUNTER — Other Ambulatory Visit (HOSPITAL_COMMUNITY): Payer: Self-pay

## 2012-08-29 DIAGNOSIS — J90 Pleural effusion, not elsewhere classified: Secondary | ICD-10-CM | POA: Diagnosis not present

## 2012-08-29 DIAGNOSIS — J9600 Acute respiratory failure, unspecified whether with hypoxia or hypercapnia: Secondary | ICD-10-CM | POA: Diagnosis not present

## 2012-08-29 DIAGNOSIS — I509 Heart failure, unspecified: Secondary | ICD-10-CM | POA: Diagnosis not present

## 2012-08-29 LAB — BLOOD GAS, ARTERIAL
Acid-Base Excess: 7.5 mmol/L — ABNORMAL HIGH (ref 0.0–2.0)
FIO2: 0.5 %
O2 Saturation: 96.3 %
pCO2 arterial: 44.2 mmHg (ref 35.0–45.0)
pO2, Arterial: 73 mmHg — ABNORMAL LOW (ref 80.0–100.0)

## 2012-08-29 LAB — CBC
HCT: 28.9 % — ABNORMAL LOW (ref 36.0–46.0)
Hemoglobin: 9.4 g/dL — ABNORMAL LOW (ref 12.0–15.0)
RBC: 3.11 MIL/uL — ABNORMAL LOW (ref 3.87–5.11)
WBC: 6.9 10*3/uL (ref 4.0–10.5)

## 2012-08-29 LAB — T3 UPTAKE: T3 Uptake Ratio: 39.1 % — ABNORMAL HIGH (ref 22.5–37.0)

## 2012-08-29 LAB — CLOSTRIDIUM DIFFICILE BY PCR: Toxigenic C. Difficile by PCR: NEGATIVE

## 2012-08-29 LAB — BASIC METABOLIC PANEL
BUN: 23 mg/dL (ref 6–23)
Chloride: 101 mEq/L (ref 96–112)
GFR calc non Af Amer: 59 mL/min — ABNORMAL LOW (ref >90)
Glucose, Bld: 124 mg/dL — ABNORMAL HIGH (ref 70–99)
Potassium: 3 mEq/L — ABNORMAL LOW (ref 3.5–5.1)
Sodium: 140 mEq/L (ref 135–145)

## 2012-08-30 ENCOUNTER — Other Ambulatory Visit (HOSPITAL_COMMUNITY): Payer: Self-pay

## 2012-08-30 DIAGNOSIS — J9819 Other pulmonary collapse: Secondary | ICD-10-CM | POA: Diagnosis not present

## 2012-08-30 DIAGNOSIS — J90 Pleural effusion, not elsewhere classified: Secondary | ICD-10-CM | POA: Diagnosis not present

## 2012-08-30 LAB — BASIC METABOLIC PANEL
BUN: 20 mg/dL (ref 6–23)
CO2: 41 mEq/L (ref 19–32)
Calcium: 8.7 mg/dL (ref 8.4–10.5)
Chloride: 96 mEq/L (ref 96–112)
Creatinine, Ser: 0.87 mg/dL (ref 0.50–1.10)

## 2012-08-30 LAB — PRO B NATRIURETIC PEPTIDE: Pro B Natriuretic peptide (BNP): 3633 pg/mL — ABNORMAL HIGH (ref 0–450)

## 2012-08-31 LAB — BASIC METABOLIC PANEL
BUN: 21 mg/dL (ref 6–23)
CO2: 39 mEq/L — ABNORMAL HIGH (ref 19–32)
CO2: 43 mEq/L (ref 19–32)
Calcium: 9.2 mg/dL (ref 8.4–10.5)
Calcium: 9.2 mg/dL (ref 8.4–10.5)
Creatinine, Ser: 0.8 mg/dL (ref 0.50–1.10)
Creatinine, Ser: 0.76 mg/dL (ref 0.50–1.10)
GFR calc non Af Amer: 64 mL/min — ABNORMAL LOW (ref >90)
GFR calc non Af Amer: 73 mL/min — ABNORMAL LOW (ref >90)
Glucose, Bld: 107 mg/dL — ABNORMAL HIGH (ref 70–99)
Glucose, Bld: 115 mg/dL — ABNORMAL HIGH (ref 70–99)

## 2012-09-01 ENCOUNTER — Other Ambulatory Visit (HOSPITAL_COMMUNITY): Payer: Self-pay

## 2012-09-01 DIAGNOSIS — Z4682 Encounter for fitting and adjustment of non-vascular catheter: Secondary | ICD-10-CM | POA: Diagnosis not present

## 2012-09-01 DIAGNOSIS — R141 Gas pain: Secondary | ICD-10-CM | POA: Diagnosis not present

## 2012-09-01 DIAGNOSIS — R143 Flatulence: Secondary | ICD-10-CM | POA: Diagnosis not present

## 2012-09-01 LAB — CBC WITH DIFFERENTIAL/PLATELET
Basophils Absolute: 0 10*3/uL (ref 0.0–0.1)
Eosinophils Absolute: 0.2 10*3/uL (ref 0.0–0.7)
Eosinophils Relative: 4 % (ref 0–5)
HCT: 27.8 % — ABNORMAL LOW (ref 36.0–46.0)
Lymphocytes Relative: 17 % (ref 12–46)
MCH: 28.7 pg (ref 26.0–34.0)
MCHC: 30.9 g/dL (ref 30.0–36.0)
MCV: 92.7 fL (ref 78.0–100.0)
Monocytes Absolute: 0.3 10*3/uL (ref 0.1–1.0)
RDW: 15.2 % (ref 11.5–15.5)

## 2012-09-02 LAB — CBC
HCT: 31.1 % — ABNORMAL LOW (ref 36.0–46.0)
Hemoglobin: 9.8 g/dL — ABNORMAL LOW (ref 12.0–15.0)
MCH: 28.7 pg (ref 26.0–34.0)
MCHC: 31.5 g/dL (ref 30.0–36.0)
MCV: 90.9 fL (ref 78.0–100.0)
Platelets: 166 K/uL (ref 150–400)
RBC: 3.42 MIL/uL — ABNORMAL LOW (ref 3.87–5.11)
RDW: 15 % (ref 11.5–15.5)
WBC: 6.2 K/uL (ref 4.0–10.5)

## 2012-09-02 LAB — BASIC METABOLIC PANEL WITH GFR
BUN: 20 mg/dL (ref 6–23)
CO2: 41 meq/L (ref 19–32)
Calcium: 9.3 mg/dL (ref 8.4–10.5)
Chloride: 94 meq/L — ABNORMAL LOW (ref 96–112)
Creatinine, Ser: 0.92 mg/dL (ref 0.50–1.10)
GFR calc Af Amer: 63 mL/min — ABNORMAL LOW (ref >90)
GFR calc non Af Amer: 54 mL/min — ABNORMAL LOW (ref >90)
Glucose, Bld: 113 mg/dL — ABNORMAL HIGH (ref 70–99)
Potassium: 3.3 meq/L — ABNORMAL LOW (ref 3.5–5.1)
Sodium: 142 meq/L (ref 135–145)

## 2012-09-03 ENCOUNTER — Other Ambulatory Visit (HOSPITAL_COMMUNITY): Payer: Self-pay

## 2012-09-03 DIAGNOSIS — Z4682 Encounter for fitting and adjustment of non-vascular catheter: Secondary | ICD-10-CM | POA: Diagnosis not present

## 2012-09-03 LAB — CBC WITH DIFFERENTIAL/PLATELET
Basophils Absolute: 0 10*3/uL (ref 0.0–0.1)
Basophils Relative: 0 % (ref 0–1)
HCT: 26.6 % — ABNORMAL LOW (ref 36.0–46.0)
Hemoglobin: 8.6 g/dL — ABNORMAL LOW (ref 12.0–15.0)
Lymphocytes Relative: 19 % (ref 12–46)
MCHC: 32.3 g/dL (ref 30.0–36.0)
Monocytes Absolute: 0.4 10*3/uL (ref 0.1–1.0)
Monocytes Relative: 7 % (ref 3–12)
Neutro Abs: 4 10*3/uL (ref 1.7–7.7)
Neutrophils Relative %: 71 % (ref 43–77)
WBC: 5.6 10*3/uL (ref 4.0–10.5)

## 2012-09-03 LAB — COMPREHENSIVE METABOLIC PANEL
BUN: 18 mg/dL (ref 6–23)
CO2: 39 mEq/L — ABNORMAL HIGH (ref 19–32)
Calcium: 8.5 mg/dL (ref 8.4–10.5)
Creatinine, Ser: 0.9 mg/dL (ref 0.50–1.10)
GFR calc Af Amer: 64 mL/min — ABNORMAL LOW (ref >90)
GFR calc non Af Amer: 55 mL/min — ABNORMAL LOW (ref >90)
Glucose, Bld: 332 mg/dL — ABNORMAL HIGH (ref 70–99)
Total Protein: 5.8 g/dL — ABNORMAL LOW (ref 6.0–8.3)

## 2012-09-03 LAB — MAGNESIUM: Magnesium: 2.2 mg/dL (ref 1.5–2.5)

## 2012-09-03 MED ORDER — IOHEXOL 300 MG/ML  SOLN
50.0000 mL | Freq: Once | INTRAMUSCULAR | Status: AC | PRN
  Administered 2012-09-03: 15 mL

## 2012-09-04 LAB — POTASSIUM: Potassium: 3.1 mEq/L — ABNORMAL LOW (ref 3.5–5.1)

## 2012-09-04 LAB — CREATININE, SERUM
Creatinine, Ser: 1.08 mg/dL (ref 0.50–1.10)
GFR calc non Af Amer: 44 mL/min — ABNORMAL LOW (ref >90)

## 2012-09-04 LAB — TSH: TSH: 2.435 u[IU]/mL (ref 0.350–4.500)

## 2012-09-05 LAB — BASIC METABOLIC PANEL
CO2: 39 mEq/L — ABNORMAL HIGH (ref 19–32)
Chloride: 104 mEq/L (ref 96–112)
Glucose, Bld: 111 mg/dL — ABNORMAL HIGH (ref 70–99)
Sodium: 147 mEq/L — ABNORMAL HIGH (ref 135–145)

## 2012-09-05 NOTE — Op Note (Signed)
dctation number:  [id number]

## 2012-09-06 LAB — BASIC METABOLIC PANEL
BUN: 34 mg/dL — ABNORMAL HIGH (ref 6–23)
CO2: 38 mEq/L — ABNORMAL HIGH (ref 19–32)
Calcium: 10.3 mg/dL (ref 8.4–10.5)
Chloride: 104 mEq/L (ref 96–112)
Creatinine, Ser: 1.16 mg/dL — ABNORMAL HIGH (ref 0.50–1.10)
Glucose, Bld: 116 mg/dL — ABNORMAL HIGH (ref 70–99)

## 2012-09-06 LAB — CBC
HCT: 28.4 % — ABNORMAL LOW (ref 36.0–46.0)
Hemoglobin: 8.8 g/dL — ABNORMAL LOW (ref 12.0–15.0)
MCH: 29.2 pg (ref 26.0–34.0)
MCV: 94.4 fL (ref 78.0–100.0)
RBC: 3.01 MIL/uL — ABNORMAL LOW (ref 3.87–5.11)
WBC: 7.4 10*3/uL (ref 4.0–10.5)

## 2012-09-07 ENCOUNTER — Other Ambulatory Visit (HOSPITAL_COMMUNITY): Payer: Self-pay

## 2012-09-07 ENCOUNTER — Other Ambulatory Visit (HOSPITAL_COMMUNITY): Payer: Medicare Other

## 2012-09-07 DIAGNOSIS — K223 Perforation of esophagus: Secondary | ICD-10-CM | POA: Diagnosis not present

## 2012-09-07 LAB — BASIC METABOLIC PANEL
BUN: 38 mg/dL — ABNORMAL HIGH (ref 6–23)
CO2: 35 mEq/L — ABNORMAL HIGH (ref 19–32)
Chloride: 108 mEq/L (ref 96–112)
Creatinine, Ser: 1.07 mg/dL (ref 0.50–1.10)
Glucose, Bld: 127 mg/dL — ABNORMAL HIGH (ref 70–99)

## 2012-09-07 LAB — PRO B NATRIURETIC PEPTIDE: Pro B Natriuretic peptide (BNP): 526 pg/mL — ABNORMAL HIGH (ref 0–450)

## 2012-09-07 MED ORDER — IOHEXOL 300 MG/ML  SOLN
150.0000 mL | Freq: Once | INTRAMUSCULAR | Status: AC | PRN
  Administered 2012-09-07: 115 mL via ORAL

## 2012-09-08 LAB — BASIC METABOLIC PANEL
BUN: 33 mg/dL — ABNORMAL HIGH (ref 6–23)
Calcium: 9.7 mg/dL (ref 8.4–10.5)
Creatinine, Ser: 1.11 mg/dL — ABNORMAL HIGH (ref 0.50–1.10)
GFR calc Af Amer: 50 mL/min — ABNORMAL LOW (ref >90)

## 2012-09-09 LAB — BASIC METABOLIC PANEL
BUN: 35 mg/dL — ABNORMAL HIGH (ref 6–23)
CO2: 31 mEq/L (ref 19–32)
Calcium: 9.9 mg/dL (ref 8.4–10.5)
GFR calc non Af Amer: 38 mL/min — ABNORMAL LOW (ref >90)
Glucose, Bld: 103 mg/dL — ABNORMAL HIGH (ref 70–99)
Sodium: 147 mEq/L — ABNORMAL HIGH (ref 135–145)

## 2012-09-09 LAB — VANCOMYCIN, RANDOM: Vancomycin Rm: 16.3 ug/mL

## 2012-09-10 LAB — RENAL FUNCTION PANEL
Albumin: 2.3 g/dL — ABNORMAL LOW (ref 3.5–5.2)
BUN: 36 mg/dL — ABNORMAL HIGH (ref 6–23)
Chloride: 107 mEq/L (ref 96–112)
Creatinine, Ser: 1.03 mg/dL (ref 0.50–1.10)
Glucose, Bld: 111 mg/dL — ABNORMAL HIGH (ref 70–99)
Phosphorus: 3 mg/dL (ref 2.3–4.6)
Potassium: 4 mEq/L (ref 3.5–5.1)

## 2012-09-11 LAB — BASIC METABOLIC PANEL
BUN: 36 mg/dL — ABNORMAL HIGH (ref 6–23)
CO2: 28 mEq/L (ref 19–32)
Glucose, Bld: 141 mg/dL — ABNORMAL HIGH (ref 70–99)
Potassium: 4.1 mEq/L (ref 3.5–5.1)
Sodium: 143 mEq/L (ref 135–145)

## 2012-09-14 LAB — VANCOMYCIN, RANDOM: Vancomycin Rm: 17.5 ug/mL

## 2012-09-14 LAB — CREATININE, SERUM
Creatinine, Ser: 1.06 mg/dL (ref 0.50–1.10)
GFR calc Af Amer: 53 mL/min — ABNORMAL LOW (ref >90)

## 2012-09-15 LAB — CBC WITH DIFFERENTIAL/PLATELET
Basophils Absolute: 0 10*3/uL (ref 0.0–0.1)
Lymphocytes Relative: 18 % (ref 12–46)
Lymphs Abs: 1.4 10*3/uL (ref 0.7–4.0)
Neutro Abs: 5.3 10*3/uL (ref 1.7–7.7)
Neutrophils Relative %: 66 % (ref 43–77)
Platelets: 193 10*3/uL (ref 150–400)
RBC: 2.96 MIL/uL — ABNORMAL LOW (ref 3.87–5.11)
RDW: 15.8 % — ABNORMAL HIGH (ref 11.5–15.5)
WBC: 7.9 10*3/uL (ref 4.0–10.5)

## 2012-09-15 LAB — BASIC METABOLIC PANEL
CO2: 32 mEq/L (ref 19–32)
Calcium: 10.1 mg/dL (ref 8.4–10.5)
Chloride: 97 mEq/L (ref 96–112)
Potassium: 4.1 mEq/L (ref 3.5–5.1)
Sodium: 134 mEq/L — ABNORMAL LOW (ref 135–145)

## 2012-09-16 LAB — URIC ACID: Uric Acid, Serum: 2.4 mg/dL (ref 2.4–7.0)

## 2012-09-16 LAB — CLOSTRIDIUM DIFFICILE BY PCR: Toxigenic C. Difficile by PCR: NEGATIVE

## 2012-09-17 ENCOUNTER — Other Ambulatory Visit (HOSPITAL_COMMUNITY): Payer: Self-pay

## 2012-09-17 DIAGNOSIS — K223 Perforation of esophagus: Secondary | ICD-10-CM | POA: Diagnosis not present

## 2012-09-17 MED ORDER — IOHEXOL 300 MG/ML  SOLN
100.0000 mL | Freq: Once | INTRAMUSCULAR | Status: AC | PRN
  Administered 2012-09-17: 50 mL via ORAL

## 2012-09-19 LAB — VANCOMYCIN, TROUGH: Vancomycin Tr: 20.3 ug/mL — ABNORMAL HIGH (ref 10.0–20.0)

## 2012-09-20 LAB — BASIC METABOLIC PANEL
Calcium: 10.6 mg/dL — ABNORMAL HIGH (ref 8.4–10.5)
GFR calc Af Amer: 49 mL/min — ABNORMAL LOW (ref >90)
GFR calc non Af Amer: 43 mL/min — ABNORMAL LOW (ref >90)
Sodium: 137 mEq/L (ref 135–145)

## 2012-09-20 LAB — CBC
MCH: 29.3 pg (ref 26.0–34.0)
MCHC: 32.2 g/dL (ref 30.0–36.0)
Platelets: 195 10*3/uL (ref 150–400)
RBC: 3 MIL/uL — ABNORMAL LOW (ref 3.87–5.11)
RDW: 15.8 % — ABNORMAL HIGH (ref 11.5–15.5)

## 2012-09-22 LAB — TSH: TSH: 1.667 u[IU]/mL (ref 0.350–4.500)

## 2012-09-22 LAB — BASIC METABOLIC PANEL
Calcium: 10.9 mg/dL — ABNORMAL HIGH (ref 8.4–10.5)
Creatinine, Ser: 1.26 mg/dL — ABNORMAL HIGH (ref 0.50–1.10)
GFR calc non Af Amer: 37 mL/min — ABNORMAL LOW (ref >90)
Sodium: 136 mEq/L (ref 135–145)

## 2012-09-23 LAB — FECAL LACTOFERRIN, QUANT: Fecal Lactoferrin: NEGATIVE

## 2012-09-24 LAB — CBC
MCHC: 32.1 g/dL (ref 30.0–36.0)
MCV: 91 fL (ref 78.0–100.0)
Platelets: 236 10*3/uL (ref 150–400)
RDW: 16.3 % — ABNORMAL HIGH (ref 11.5–15.5)
WBC: 5.7 10*3/uL (ref 4.0–10.5)

## 2012-09-24 LAB — BASIC METABOLIC PANEL
BUN: 37 mg/dL — ABNORMAL HIGH (ref 6–23)
Chloride: 94 mEq/L — ABNORMAL LOW (ref 96–112)
Creatinine, Ser: 1.35 mg/dL — ABNORMAL HIGH (ref 0.50–1.10)
GFR calc Af Amer: 39 mL/min — ABNORMAL LOW (ref >90)
GFR calc non Af Amer: 34 mL/min — ABNORMAL LOW (ref >90)

## 2012-09-25 LAB — OVA AND PARASITE EXAMINATION

## 2012-09-25 LAB — BASIC METABOLIC PANEL
CO2: 29 mEq/L (ref 19–32)
Calcium: 10.4 mg/dL (ref 8.4–10.5)
Creatinine, Ser: 1.26 mg/dL — ABNORMAL HIGH (ref 0.50–1.10)
GFR calc Af Amer: 43 mL/min — ABNORMAL LOW (ref >90)
GFR calc non Af Amer: 37 mL/min — ABNORMAL LOW (ref >90)
Sodium: 132 mEq/L — ABNORMAL LOW (ref 135–145)

## 2012-09-26 ENCOUNTER — Inpatient Hospital Stay
Admission: RE | Admit: 2012-09-26 | Discharge: 2012-11-08 | Disposition: A | Discharge status: Home / Self Care | Payer: Medicare Other | Source: Skilled Nursing Facility | Attending: Internal Medicine | Admitting: Internal Medicine

## 2012-09-26 DIAGNOSIS — Z5189 Encounter for other specified aftercare: Secondary | ICD-10-CM | POA: Diagnosis not present

## 2012-09-26 DIAGNOSIS — I25810 Atherosclerosis of coronary artery bypass graft(s) without angina pectoris: Secondary | ICD-10-CM | POA: Diagnosis not present

## 2012-09-26 DIAGNOSIS — Z951 Presence of aortocoronary bypass graft: Secondary | ICD-10-CM | POA: Diagnosis not present

## 2012-09-26 DIAGNOSIS — E039 Hypothyroidism, unspecified: Secondary | ICD-10-CM | POA: Diagnosis not present

## 2012-09-26 DIAGNOSIS — S72019A Unspecified intracapsular fracture of unspecified femur, initial encounter for closed fracture: Secondary | ICD-10-CM | POA: Diagnosis not present

## 2012-09-26 DIAGNOSIS — D62 Acute posthemorrhagic anemia: Secondary | ICD-10-CM | POA: Diagnosis not present

## 2012-09-26 DIAGNOSIS — K209 Esophagitis, unspecified without bleeding: Secondary | ICD-10-CM | POA: Diagnosis not present

## 2012-09-26 DIAGNOSIS — K219 Gastro-esophageal reflux disease without esophagitis: Secondary | ICD-10-CM | POA: Diagnosis not present

## 2012-09-26 DIAGNOSIS — S52599A Other fractures of lower end of unspecified radius, initial encounter for closed fracture: Secondary | ICD-10-CM | POA: Diagnosis not present

## 2012-09-26 DIAGNOSIS — S72009A Fracture of unspecified part of neck of unspecified femur, initial encounter for closed fracture: Secondary | ICD-10-CM | POA: Diagnosis not present

## 2012-09-26 DIAGNOSIS — I2510 Atherosclerotic heart disease of native coronary artery without angina pectoris: Secondary | ICD-10-CM | POA: Diagnosis not present

## 2012-09-26 DIAGNOSIS — Z96649 Presence of unspecified artificial hip joint: Secondary | ICD-10-CM | POA: Diagnosis not present

## 2012-09-26 DIAGNOSIS — Z45018 Encounter for adjustment and management of other part of cardiac pacemaker: Secondary | ICD-10-CM | POA: Diagnosis not present

## 2012-09-26 DIAGNOSIS — M84453A Pathological fracture, unspecified femur, initial encounter for fracture: Secondary | ICD-10-CM | POA: Diagnosis not present

## 2012-09-26 DIAGNOSIS — I4891 Unspecified atrial fibrillation: Secondary | ICD-10-CM | POA: Diagnosis not present

## 2012-09-26 DIAGNOSIS — I10 Essential (primary) hypertension: Secondary | ICD-10-CM | POA: Diagnosis not present

## 2012-09-26 DIAGNOSIS — J9600 Acute respiratory failure, unspecified whether with hypoxia or hypercapnia: Secondary | ICD-10-CM | POA: Diagnosis not present

## 2012-09-26 DIAGNOSIS — J9851 Mediastinitis: Secondary | ICD-10-CM | POA: Diagnosis not present

## 2012-09-26 DIAGNOSIS — E782 Mixed hyperlipidemia: Secondary | ICD-10-CM | POA: Diagnosis not present

## 2012-09-26 DIAGNOSIS — R300 Dysuria: Secondary | ICD-10-CM | POA: Diagnosis not present

## 2012-09-26 DIAGNOSIS — I509 Heart failure, unspecified: Secondary | ICD-10-CM | POA: Diagnosis not present

## 2012-09-26 DIAGNOSIS — G5600 Carpal tunnel syndrome, unspecified upper limb: Secondary | ICD-10-CM | POA: Diagnosis not present

## 2012-09-26 DIAGNOSIS — D649 Anemia, unspecified: Secondary | ICD-10-CM | POA: Diagnosis not present

## 2012-09-26 DIAGNOSIS — M25539 Pain in unspecified wrist: Secondary | ICD-10-CM | POA: Diagnosis not present

## 2012-09-26 DIAGNOSIS — I959 Hypotension, unspecified: Secondary | ICD-10-CM | POA: Diagnosis not present

## 2012-09-26 DIAGNOSIS — S72009D Fracture of unspecified part of neck of unspecified femur, subsequent encounter for closed fracture with routine healing: Secondary | ICD-10-CM | POA: Diagnosis not present

## 2012-09-26 DIAGNOSIS — E46 Unspecified protein-calorie malnutrition: Secondary | ICD-10-CM | POA: Diagnosis not present

## 2012-09-26 DIAGNOSIS — E440 Moderate protein-calorie malnutrition: Secondary | ICD-10-CM | POA: Diagnosis not present

## 2012-09-26 DIAGNOSIS — K223 Perforation of esophagus: Secondary | ICD-10-CM | POA: Diagnosis not present

## 2012-09-26 LAB — CBC
MCV: 91.9 fL (ref 78.0–100.0)
Platelets: 208 10*3/uL (ref 150–400)
RBC: 3.2 MIL/uL — ABNORMAL LOW (ref 3.87–5.11)
RDW: 16.2 % — ABNORMAL HIGH (ref 11.5–15.5)
WBC: 4.9 10*3/uL (ref 4.0–10.5)

## 2012-09-26 LAB — BASIC METABOLIC PANEL
CO2: 31 mEq/L (ref 19–32)
Chloride: 96 mEq/L (ref 96–112)
Creatinine, Ser: 1.26 mg/dL — ABNORMAL HIGH (ref 0.50–1.10)
GFR calc Af Amer: 43 mL/min — ABNORMAL LOW (ref >90)
Potassium: 4.2 mEq/L (ref 3.5–5.1)
Sodium: 135 mEq/L (ref 135–145)

## 2012-09-26 LAB — STOOL CULTURE

## 2012-10-01 DIAGNOSIS — K223 Perforation of esophagus: Secondary | ICD-10-CM | POA: Diagnosis not present

## 2012-10-01 DIAGNOSIS — M84453A Pathological fracture, unspecified femur, initial encounter for fracture: Secondary | ICD-10-CM | POA: Diagnosis not present

## 2012-10-01 DIAGNOSIS — I25810 Atherosclerosis of coronary artery bypass graft(s) without angina pectoris: Secondary | ICD-10-CM | POA: Diagnosis not present

## 2012-10-01 DIAGNOSIS — K219 Gastro-esophageal reflux disease without esophagitis: Secondary | ICD-10-CM | POA: Diagnosis not present

## 2012-10-02 DIAGNOSIS — R300 Dysuria: Secondary | ICD-10-CM | POA: Diagnosis not present

## 2012-10-04 DIAGNOSIS — S72019A Unspecified intracapsular fracture of unspecified femur, initial encounter for closed fracture: Secondary | ICD-10-CM | POA: Diagnosis not present

## 2012-10-04 DIAGNOSIS — M25539 Pain in unspecified wrist: Secondary | ICD-10-CM | POA: Diagnosis not present

## 2012-10-11 DIAGNOSIS — G5600 Carpal tunnel syndrome, unspecified upper limb: Secondary | ICD-10-CM | POA: Diagnosis not present

## 2012-10-11 DIAGNOSIS — S52599A Other fractures of lower end of unspecified radius, initial encounter for closed fracture: Secondary | ICD-10-CM | POA: Diagnosis not present

## 2012-10-17 ENCOUNTER — Ambulatory Visit (INDEPENDENT_AMBULATORY_CARE_PROVIDER_SITE_OTHER): Payer: Medicare Other | Admitting: General Surgery

## 2012-10-17 ENCOUNTER — Encounter (INDEPENDENT_AMBULATORY_CARE_PROVIDER_SITE_OTHER): Payer: Self-pay | Admitting: General Surgery

## 2012-10-17 VITALS — BP 138/76 | HR 72 | Temp 98.3°F | Resp 18 | Ht 65.0 in | Wt 144.0 lb

## 2012-10-17 DIAGNOSIS — Z09 Encounter for follow-up examination after completed treatment for conditions other than malignant neoplasm: Secondary | ICD-10-CM | POA: Insufficient documentation

## 2012-10-17 NOTE — Progress Notes (Signed)
Subjective:     Patient ID: Amy [name], female   DOB: 1922/01/23, 78 y.o.   MRN: [id number]  HPI Patient underwent open gastrostomy tube after complications from intubation. This was later modified with placement of a jejunostomy tube via the gastrostomy tube by interventional radiology. Her esophagus has healed. She is followed by Dr. Erik Obey from [facility]. She is sent for removal of her gastrostomy tube.  Review of Systems     Objective:   Physical Exam Abdomen soft and nontender. Midline wound healed. Gastrostomy tube with jejunostomy portion in place. Area was prepped and local anesthetic was injected. Tube was removed completely and intact. A dressing was applied. She tolerated this fairly well.    Assessment:     GJ tube removed    Plan:     Dressing instructions were given for her facility. Return when necessary.

## 2012-10-29 DIAGNOSIS — I4891 Unspecified atrial fibrillation: Secondary | ICD-10-CM | POA: Diagnosis not present

## 2012-10-29 DIAGNOSIS — I2510 Atherosclerotic heart disease of native coronary artery without angina pectoris: Secondary | ICD-10-CM | POA: Diagnosis not present

## 2012-10-29 DIAGNOSIS — E782 Mixed hyperlipidemia: Secondary | ICD-10-CM | POA: Diagnosis not present

## 2012-10-29 DIAGNOSIS — Z45018 Encounter for adjustment and management of other part of cardiac pacemaker: Secondary | ICD-10-CM | POA: Diagnosis not present

## 2012-10-30 ENCOUNTER — Other Ambulatory Visit (HOSPITAL_COMMUNITY): Payer: Self-pay | Admitting: Cardiovascular Disease

## 2012-10-30 DIAGNOSIS — I739 Peripheral vascular disease, unspecified: Secondary | ICD-10-CM

## 2012-10-30 DIAGNOSIS — R200 Anesthesia of skin: Secondary | ICD-10-CM

## 2012-11-06 DIAGNOSIS — I959 Hypotension, unspecified: Secondary | ICD-10-CM | POA: Diagnosis not present

## 2012-11-07 DIAGNOSIS — K223 Perforation of esophagus: Secondary | ICD-10-CM | POA: Diagnosis not present

## 2012-11-07 DIAGNOSIS — K219 Gastro-esophageal reflux disease without esophagitis: Secondary | ICD-10-CM | POA: Diagnosis not present

## 2012-11-09 DIAGNOSIS — I10 Essential (primary) hypertension: Secondary | ICD-10-CM | POA: Diagnosis not present

## 2012-11-09 DIAGNOSIS — I2510 Atherosclerotic heart disease of native coronary artery without angina pectoris: Secondary | ICD-10-CM | POA: Diagnosis not present

## 2012-11-09 DIAGNOSIS — E039 Hypothyroidism, unspecified: Secondary | ICD-10-CM | POA: Diagnosis not present

## 2012-11-10 DIAGNOSIS — Z950 Presence of cardiac pacemaker: Secondary | ICD-10-CM | POA: Diagnosis not present

## 2012-11-10 DIAGNOSIS — M159 Polyosteoarthritis, unspecified: Secondary | ICD-10-CM | POA: Diagnosis not present

## 2012-11-10 DIAGNOSIS — I10 Essential (primary) hypertension: Secondary | ICD-10-CM | POA: Diagnosis not present

## 2012-11-10 DIAGNOSIS — K219 Gastro-esophageal reflux disease without esophagitis: Secondary | ICD-10-CM | POA: Diagnosis not present

## 2012-11-10 DIAGNOSIS — I2510 Atherosclerotic heart disease of native coronary artery without angina pectoris: Secondary | ICD-10-CM | POA: Diagnosis not present

## 2012-11-10 DIAGNOSIS — Z471 Aftercare following joint replacement surgery: Secondary | ICD-10-CM | POA: Diagnosis not present

## 2012-11-12 DIAGNOSIS — Z950 Presence of cardiac pacemaker: Secondary | ICD-10-CM | POA: Diagnosis not present

## 2012-11-12 DIAGNOSIS — M159 Polyosteoarthritis, unspecified: Secondary | ICD-10-CM | POA: Diagnosis not present

## 2012-11-12 DIAGNOSIS — K219 Gastro-esophageal reflux disease without esophagitis: Secondary | ICD-10-CM | POA: Diagnosis not present

## 2012-11-12 DIAGNOSIS — Z471 Aftercare following joint replacement surgery: Secondary | ICD-10-CM | POA: Diagnosis not present

## 2012-11-12 DIAGNOSIS — I10 Essential (primary) hypertension: Secondary | ICD-10-CM | POA: Diagnosis not present

## 2012-11-12 DIAGNOSIS — I2510 Atherosclerotic heart disease of native coronary artery without angina pectoris: Secondary | ICD-10-CM | POA: Diagnosis not present

## 2012-11-13 DIAGNOSIS — Z950 Presence of cardiac pacemaker: Secondary | ICD-10-CM | POA: Diagnosis not present

## 2012-11-13 DIAGNOSIS — I2510 Atherosclerotic heart disease of native coronary artery without angina pectoris: Secondary | ICD-10-CM | POA: Diagnosis not present

## 2012-11-13 DIAGNOSIS — K219 Gastro-esophageal reflux disease without esophagitis: Secondary | ICD-10-CM | POA: Diagnosis not present

## 2012-11-13 DIAGNOSIS — I10 Essential (primary) hypertension: Secondary | ICD-10-CM | POA: Diagnosis not present

## 2012-11-13 DIAGNOSIS — M159 Polyosteoarthritis, unspecified: Secondary | ICD-10-CM | POA: Diagnosis not present

## 2012-11-13 DIAGNOSIS — G5600 Carpal tunnel syndrome, unspecified upper limb: Secondary | ICD-10-CM | POA: Diagnosis not present

## 2012-11-13 DIAGNOSIS — Z471 Aftercare following joint replacement surgery: Secondary | ICD-10-CM | POA: Diagnosis not present

## 2012-11-15 DIAGNOSIS — I10 Essential (primary) hypertension: Secondary | ICD-10-CM | POA: Diagnosis not present

## 2012-11-15 DIAGNOSIS — Z950 Presence of cardiac pacemaker: Secondary | ICD-10-CM | POA: Diagnosis not present

## 2012-11-15 DIAGNOSIS — M159 Polyosteoarthritis, unspecified: Secondary | ICD-10-CM | POA: Diagnosis not present

## 2012-11-15 DIAGNOSIS — K219 Gastro-esophageal reflux disease without esophagitis: Secondary | ICD-10-CM | POA: Diagnosis not present

## 2012-11-15 DIAGNOSIS — I2510 Atherosclerotic heart disease of native coronary artery without angina pectoris: Secondary | ICD-10-CM | POA: Diagnosis not present

## 2012-11-15 DIAGNOSIS — Z471 Aftercare following joint replacement surgery: Secondary | ICD-10-CM | POA: Diagnosis not present

## 2012-11-16 DIAGNOSIS — I10 Essential (primary) hypertension: Secondary | ICD-10-CM | POA: Diagnosis not present

## 2012-11-16 DIAGNOSIS — I2510 Atherosclerotic heart disease of native coronary artery without angina pectoris: Secondary | ICD-10-CM | POA: Diagnosis not present

## 2012-11-16 DIAGNOSIS — M159 Polyosteoarthritis, unspecified: Secondary | ICD-10-CM | POA: Diagnosis not present

## 2012-11-16 DIAGNOSIS — Z471 Aftercare following joint replacement surgery: Secondary | ICD-10-CM | POA: Diagnosis not present

## 2012-11-16 DIAGNOSIS — Z950 Presence of cardiac pacemaker: Secondary | ICD-10-CM | POA: Diagnosis not present

## 2012-11-16 DIAGNOSIS — K219 Gastro-esophageal reflux disease without esophagitis: Secondary | ICD-10-CM | POA: Diagnosis not present

## 2012-11-19 DIAGNOSIS — K219 Gastro-esophageal reflux disease without esophagitis: Secondary | ICD-10-CM | POA: Diagnosis not present

## 2012-11-19 DIAGNOSIS — M159 Polyosteoarthritis, unspecified: Secondary | ICD-10-CM | POA: Diagnosis not present

## 2012-11-19 DIAGNOSIS — I10 Essential (primary) hypertension: Secondary | ICD-10-CM | POA: Diagnosis not present

## 2012-11-19 DIAGNOSIS — Z950 Presence of cardiac pacemaker: Secondary | ICD-10-CM | POA: Diagnosis not present

## 2012-11-19 DIAGNOSIS — Z471 Aftercare following joint replacement surgery: Secondary | ICD-10-CM | POA: Diagnosis not present

## 2012-11-19 DIAGNOSIS — I2510 Atherosclerotic heart disease of native coronary artery without angina pectoris: Secondary | ICD-10-CM | POA: Diagnosis not present

## 2012-11-20 DIAGNOSIS — R7881 Bacteremia: Secondary | ICD-10-CM | POA: Diagnosis not present

## 2012-11-20 DIAGNOSIS — K219 Gastro-esophageal reflux disease without esophagitis: Secondary | ICD-10-CM | POA: Diagnosis not present

## 2012-11-20 DIAGNOSIS — I2510 Atherosclerotic heart disease of native coronary artery without angina pectoris: Secondary | ICD-10-CM | POA: Diagnosis not present

## 2012-11-20 DIAGNOSIS — Z471 Aftercare following joint replacement surgery: Secondary | ICD-10-CM | POA: Diagnosis not present

## 2012-11-20 DIAGNOSIS — M159 Polyosteoarthritis, unspecified: Secondary | ICD-10-CM | POA: Diagnosis not present

## 2012-11-20 DIAGNOSIS — N390 Urinary tract infection, site not specified: Secondary | ICD-10-CM | POA: Diagnosis not present

## 2012-11-20 DIAGNOSIS — Z950 Presence of cardiac pacemaker: Secondary | ICD-10-CM | POA: Diagnosis not present

## 2012-11-20 DIAGNOSIS — I10 Essential (primary) hypertension: Secondary | ICD-10-CM | POA: Diagnosis not present

## 2012-11-22 DIAGNOSIS — I2510 Atherosclerotic heart disease of native coronary artery without angina pectoris: Secondary | ICD-10-CM | POA: Diagnosis not present

## 2012-11-22 DIAGNOSIS — Z471 Aftercare following joint replacement surgery: Secondary | ICD-10-CM | POA: Diagnosis not present

## 2012-11-22 DIAGNOSIS — K219 Gastro-esophageal reflux disease without esophagitis: Secondary | ICD-10-CM | POA: Diagnosis not present

## 2012-11-22 DIAGNOSIS — I10 Essential (primary) hypertension: Secondary | ICD-10-CM | POA: Diagnosis not present

## 2012-11-22 DIAGNOSIS — Z950 Presence of cardiac pacemaker: Secondary | ICD-10-CM | POA: Diagnosis not present

## 2012-11-22 DIAGNOSIS — M159 Polyosteoarthritis, unspecified: Secondary | ICD-10-CM | POA: Diagnosis not present

## 2012-11-26 ENCOUNTER — Ambulatory Visit (HOSPITAL_COMMUNITY)
Admit: 2012-11-26 | Discharge: 2012-11-26 | Disposition: A | Discharge status: Home / Self Care | Payer: Medicare Other | Attending: Cardiovascular Disease | Admitting: Cardiovascular Disease

## 2012-11-26 DIAGNOSIS — Z950 Presence of cardiac pacemaker: Secondary | ICD-10-CM | POA: Diagnosis not present

## 2012-11-26 DIAGNOSIS — R209 Unspecified disturbances of skin sensation: Secondary | ICD-10-CM | POA: Insufficient documentation

## 2012-11-26 DIAGNOSIS — I70219 Atherosclerosis of native arteries of extremities with intermittent claudication, unspecified extremity: Secondary | ICD-10-CM | POA: Diagnosis not present

## 2012-11-26 DIAGNOSIS — I2510 Atherosclerotic heart disease of native coronary artery without angina pectoris: Secondary | ICD-10-CM | POA: Diagnosis not present

## 2012-11-26 DIAGNOSIS — I739 Peripheral vascular disease, unspecified: Secondary | ICD-10-CM | POA: Insufficient documentation

## 2012-11-26 DIAGNOSIS — K219 Gastro-esophageal reflux disease without esophagitis: Secondary | ICD-10-CM | POA: Diagnosis not present

## 2012-11-26 DIAGNOSIS — I10 Essential (primary) hypertension: Secondary | ICD-10-CM | POA: Diagnosis not present

## 2012-11-26 DIAGNOSIS — R200 Anesthesia of skin: Secondary | ICD-10-CM

## 2012-11-26 DIAGNOSIS — M159 Polyosteoarthritis, unspecified: Secondary | ICD-10-CM | POA: Diagnosis not present

## 2012-11-26 DIAGNOSIS — Z471 Aftercare following joint replacement surgery: Secondary | ICD-10-CM | POA: Diagnosis not present

## 2012-11-26 NOTE — Progress Notes (Signed)
LEA Duplex Completed.  Amy [name]    

## 2012-11-29 DIAGNOSIS — Z950 Presence of cardiac pacemaker: Secondary | ICD-10-CM | POA: Diagnosis not present

## 2012-11-29 DIAGNOSIS — I2510 Atherosclerotic heart disease of native coronary artery without angina pectoris: Secondary | ICD-10-CM | POA: Diagnosis not present

## 2012-11-29 DIAGNOSIS — I10 Essential (primary) hypertension: Secondary | ICD-10-CM | POA: Diagnosis not present

## 2012-11-29 DIAGNOSIS — M159 Polyosteoarthritis, unspecified: Secondary | ICD-10-CM | POA: Diagnosis not present

## 2012-11-29 DIAGNOSIS — Z471 Aftercare following joint replacement surgery: Secondary | ICD-10-CM | POA: Diagnosis not present

## 2012-11-29 DIAGNOSIS — K219 Gastro-esophageal reflux disease without esophagitis: Secondary | ICD-10-CM | POA: Diagnosis not present

## 2012-11-30 DIAGNOSIS — Z471 Aftercare following joint replacement surgery: Secondary | ICD-10-CM | POA: Diagnosis not present

## 2012-11-30 DIAGNOSIS — M159 Polyosteoarthritis, unspecified: Secondary | ICD-10-CM | POA: Diagnosis not present

## 2012-11-30 DIAGNOSIS — K219 Gastro-esophageal reflux disease without esophagitis: Secondary | ICD-10-CM | POA: Diagnosis not present

## 2012-11-30 DIAGNOSIS — Z950 Presence of cardiac pacemaker: Secondary | ICD-10-CM | POA: Diagnosis not present

## 2012-11-30 DIAGNOSIS — I2510 Atherosclerotic heart disease of native coronary artery without angina pectoris: Secondary | ICD-10-CM | POA: Diagnosis not present

## 2012-11-30 DIAGNOSIS — I10 Essential (primary) hypertension: Secondary | ICD-10-CM | POA: Diagnosis not present

## 2012-12-03 ENCOUNTER — Encounter (HOSPITAL_COMMUNITY): Payer: Self-pay | Admitting: *Deleted

## 2012-12-03 ENCOUNTER — Emergency Department (HOSPITAL_COMMUNITY)
Admission: EM | Admit: 2012-12-03 | Discharge: 2012-12-04 | Disposition: A | Discharge status: Home / Self Care | Payer: Medicare Other | Source: Home / Self Care | Attending: Emergency Medicine | Admitting: Emergency Medicine

## 2012-12-03 ENCOUNTER — Other Ambulatory Visit: Payer: Self-pay

## 2012-12-03 ENCOUNTER — Emergency Department (HOSPITAL_COMMUNITY): Payer: Medicare Other

## 2012-12-03 DIAGNOSIS — I129 Hypertensive chronic kidney disease with stage 1 through stage 4 chronic kidney disease, or unspecified chronic kidney disease: Secondary | ICD-10-CM | POA: Insufficient documentation

## 2012-12-03 DIAGNOSIS — S40019A Contusion of unspecified shoulder, initial encounter: Secondary | ICD-10-CM | POA: Diagnosis not present

## 2012-12-03 DIAGNOSIS — J45909 Unspecified asthma, uncomplicated: Secondary | ICD-10-CM | POA: Diagnosis present

## 2012-12-03 DIAGNOSIS — M25532 Pain in left wrist: Secondary | ICD-10-CM

## 2012-12-03 DIAGNOSIS — E7800 Pure hypercholesterolemia, unspecified: Secondary | ICD-10-CM | POA: Diagnosis present

## 2012-12-03 DIAGNOSIS — N390 Urinary tract infection, site not specified: Secondary | ICD-10-CM

## 2012-12-03 DIAGNOSIS — R296 Repeated falls: Secondary | ICD-10-CM | POA: Insufficient documentation

## 2012-12-03 DIAGNOSIS — Z96649 Presence of unspecified artificial hip joint: Secondary | ICD-10-CM | POA: Diagnosis not present

## 2012-12-03 DIAGNOSIS — Z79899 Other long term (current) drug therapy: Secondary | ICD-10-CM | POA: Diagnosis not present

## 2012-12-03 DIAGNOSIS — D62 Acute posthemorrhagic anemia: Secondary | ICD-10-CM | POA: Diagnosis not present

## 2012-12-03 DIAGNOSIS — Z931 Gastrostomy status: Secondary | ICD-10-CM | POA: Insufficient documentation

## 2012-12-03 DIAGNOSIS — K219 Gastro-esophageal reflux disease without esophagitis: Secondary | ICD-10-CM | POA: Diagnosis not present

## 2012-12-03 DIAGNOSIS — S6990XA Unspecified injury of unspecified wrist, hand and finger(s), initial encounter: Secondary | ICD-10-CM | POA: Insufficient documentation

## 2012-12-03 DIAGNOSIS — S0003XA Contusion of scalp, initial encounter: Secondary | ICD-10-CM | POA: Diagnosis not present

## 2012-12-03 DIAGNOSIS — R195 Other fecal abnormalities: Secondary | ICD-10-CM

## 2012-12-03 DIAGNOSIS — R52 Pain, unspecified: Secondary | ICD-10-CM | POA: Diagnosis not present

## 2012-12-03 DIAGNOSIS — F411 Generalized anxiety disorder: Secondary | ICD-10-CM | POA: Diagnosis present

## 2012-12-03 DIAGNOSIS — J9600 Acute respiratory failure, unspecified whether with hypoxia or hypercapnia: Secondary | ICD-10-CM | POA: Diagnosis not present

## 2012-12-03 DIAGNOSIS — I2510 Atherosclerotic heart disease of native coronary artery without angina pectoris: Secondary | ICD-10-CM | POA: Diagnosis not present

## 2012-12-03 DIAGNOSIS — N183 Chronic kidney disease, stage 3 unspecified: Secondary | ICD-10-CM | POA: Diagnosis not present

## 2012-12-03 DIAGNOSIS — Z950 Presence of cardiac pacemaker: Secondary | ICD-10-CM | POA: Insufficient documentation

## 2012-12-03 DIAGNOSIS — Z87440 Personal history of urinary (tract) infections: Secondary | ICD-10-CM | POA: Diagnosis not present

## 2012-12-03 DIAGNOSIS — K5732 Diverticulitis of large intestine without perforation or abscess without bleeding: Secondary | ICD-10-CM | POA: Diagnosis not present

## 2012-12-03 DIAGNOSIS — Z8719 Personal history of other diseases of the digestive system: Secondary | ICD-10-CM | POA: Insufficient documentation

## 2012-12-03 DIAGNOSIS — E039 Hypothyroidism, unspecified: Secondary | ICD-10-CM | POA: Diagnosis present

## 2012-12-03 DIAGNOSIS — S79919A Unspecified injury of unspecified hip, initial encounter: Secondary | ICD-10-CM | POA: Diagnosis not present

## 2012-12-03 DIAGNOSIS — M159 Polyosteoarthritis, unspecified: Secondary | ICD-10-CM | POA: Diagnosis not present

## 2012-12-03 DIAGNOSIS — R109 Unspecified abdominal pain: Secondary | ICD-10-CM | POA: Diagnosis not present

## 2012-12-03 DIAGNOSIS — Z794 Long term (current) use of insulin: Secondary | ICD-10-CM | POA: Insufficient documentation

## 2012-12-03 DIAGNOSIS — Y9289 Other specified places as the place of occurrence of the external cause: Secondary | ICD-10-CM | POA: Insufficient documentation

## 2012-12-03 DIAGNOSIS — Z471 Aftercare following joint replacement surgery: Secondary | ICD-10-CM | POA: Diagnosis not present

## 2012-12-03 DIAGNOSIS — R5381 Other malaise: Secondary | ICD-10-CM | POA: Diagnosis not present

## 2012-12-03 DIAGNOSIS — S46909A Unspecified injury of unspecified muscle, fascia and tendon at shoulder and upper arm level, unspecified arm, initial encounter: Secondary | ICD-10-CM | POA: Diagnosis not present

## 2012-12-03 DIAGNOSIS — M129 Arthropathy, unspecified: Secondary | ICD-10-CM | POA: Diagnosis present

## 2012-12-03 DIAGNOSIS — I10 Essential (primary) hypertension: Secondary | ICD-10-CM | POA: Diagnosis not present

## 2012-12-03 DIAGNOSIS — R3915 Urgency of urination: Secondary | ICD-10-CM | POA: Insufficient documentation

## 2012-12-03 DIAGNOSIS — S1093XA Contusion of unspecified part of neck, initial encounter: Secondary | ICD-10-CM | POA: Insufficient documentation

## 2012-12-03 DIAGNOSIS — S59909A Unspecified injury of unspecified elbow, initial encounter: Secondary | ICD-10-CM | POA: Diagnosis not present

## 2012-12-03 DIAGNOSIS — Z7982 Long term (current) use of aspirin: Secondary | ICD-10-CM | POA: Diagnosis not present

## 2012-12-03 DIAGNOSIS — S0083XA Contusion of other part of head, initial encounter: Secondary | ICD-10-CM | POA: Diagnosis not present

## 2012-12-03 DIAGNOSIS — R350 Frequency of micturition: Secondary | ICD-10-CM | POA: Insufficient documentation

## 2012-12-03 DIAGNOSIS — I252 Old myocardial infarction: Secondary | ICD-10-CM | POA: Diagnosis not present

## 2012-12-03 DIAGNOSIS — IMO0002 Reserved for concepts with insufficient information to code with codable children: Secondary | ICD-10-CM | POA: Diagnosis not present

## 2012-12-03 DIAGNOSIS — R197 Diarrhea, unspecified: Secondary | ICD-10-CM | POA: Insufficient documentation

## 2012-12-03 DIAGNOSIS — M25519 Pain in unspecified shoulder: Secondary | ICD-10-CM | POA: Diagnosis not present

## 2012-12-03 DIAGNOSIS — M25539 Pain in unspecified wrist: Secondary | ICD-10-CM | POA: Diagnosis not present

## 2012-12-03 DIAGNOSIS — R531 Weakness: Secondary | ICD-10-CM

## 2012-12-03 DIAGNOSIS — S4980XA Other specified injuries of shoulder and upper arm, unspecified arm, initial encounter: Secondary | ICD-10-CM | POA: Diagnosis not present

## 2012-12-03 DIAGNOSIS — M25559 Pain in unspecified hip: Secondary | ICD-10-CM | POA: Diagnosis not present

## 2012-12-03 DIAGNOSIS — S0033XA Contusion of nose, initial encounter: Secondary | ICD-10-CM

## 2012-12-03 DIAGNOSIS — I4891 Unspecified atrial fibrillation: Secondary | ICD-10-CM | POA: Diagnosis present

## 2012-12-03 DIAGNOSIS — Y9301 Activity, walking, marching and hiking: Secondary | ICD-10-CM | POA: Insufficient documentation

## 2012-12-03 DIAGNOSIS — R5383 Other fatigue: Secondary | ICD-10-CM | POA: Insufficient documentation

## 2012-12-03 DIAGNOSIS — Z86718 Personal history of other venous thrombosis and embolism: Secondary | ICD-10-CM | POA: Diagnosis not present

## 2012-12-03 DIAGNOSIS — Z951 Presence of aortocoronary bypass graft: Secondary | ICD-10-CM | POA: Diagnosis not present

## 2012-12-03 DIAGNOSIS — Z8739 Personal history of other diseases of the musculoskeletal system and connective tissue: Secondary | ICD-10-CM | POA: Insufficient documentation

## 2012-12-03 DIAGNOSIS — Z8709 Personal history of other diseases of the respiratory system: Secondary | ICD-10-CM | POA: Insufficient documentation

## 2012-12-03 DIAGNOSIS — Z8701 Personal history of pneumonia (recurrent): Secondary | ICD-10-CM | POA: Diagnosis not present

## 2012-12-03 DIAGNOSIS — W19XXXA Unspecified fall, initial encounter: Secondary | ICD-10-CM

## 2012-12-03 DIAGNOSIS — M25551 Pain in right hip: Secondary | ICD-10-CM

## 2012-12-03 DIAGNOSIS — Z8679 Personal history of other diseases of the circulatory system: Secondary | ICD-10-CM | POA: Insufficient documentation

## 2012-12-03 DIAGNOSIS — I495 Sick sinus syndrome: Secondary | ICD-10-CM | POA: Diagnosis present

## 2012-12-03 DIAGNOSIS — S40011A Contusion of right shoulder, initial encounter: Secondary | ICD-10-CM

## 2012-12-03 LAB — CBC WITH DIFFERENTIAL/PLATELET
Basophils Absolute: 0 10*3/uL (ref 0.0–0.1)
Eosinophils Absolute: 0 10*3/uL (ref 0.0–0.7)
Eosinophils Relative: 0 % (ref 0–5)
MCH: 28.8 pg (ref 26.0–34.0)
MCV: 88.4 fL (ref 78.0–100.0)
Platelets: 202 10*3/uL (ref 150–400)
RDW: 13.4 % (ref 11.5–15.5)

## 2012-12-03 LAB — COMPREHENSIVE METABOLIC PANEL
ALT: 16 U/L (ref 0–35)
Calcium: 10.4 mg/dL (ref 8.4–10.5)
GFR calc Af Amer: 45 mL/min — ABNORMAL LOW (ref >90)
Glucose, Bld: 118 mg/dL — ABNORMAL HIGH (ref 70–99)
Sodium: 136 mEq/L (ref 135–145)
Total Protein: 7.5 g/dL (ref 6.0–8.3)

## 2012-12-03 LAB — URINALYSIS, ROUTINE W REFLEX MICROSCOPIC
Bilirubin Urine: NEGATIVE
Protein, ur: NEGATIVE mg/dL
Urobilinogen, UA: 0.2 mg/dL (ref 0.0–1.0)

## 2012-12-03 LAB — URINE MICROSCOPIC-ADD ON

## 2012-12-03 MED ORDER — CEPHALEXIN 500 MG PO CAPS: 500.0000 mg | ORAL_CAPSULE | Freq: Three times a day (TID) | ORAL | Status: DC

## 2012-12-03 MED ORDER — CEPHALEXIN 500 MG PO CAPS
500.0000 mg | ORAL_CAPSULE | Freq: Once | ORAL | Status: AC
  Administered 2012-12-04: 500 mg via ORAL
  Filled 2012-12-03: qty 1

## 2012-12-03 MED ORDER — METHOCARBAMOL 500 MG PO TABS: 500.0000 mg | ORAL_TABLET | Freq: Three times a day (TID) | ORAL | Status: DC

## 2012-12-03 MED ORDER — SODIUM CHLORIDE 0.9 % IV BOLUS (SEPSIS)
500.0000 mL | Freq: Once | INTRAVENOUS | Status: AC
  Administered 2012-12-03: 22:00:00 via INTRAVENOUS

## 2012-12-03 MED ORDER — OXYCODONE-ACETAMINOPHEN 5-325 MG PO TABS
1.0000 | ORAL_TABLET | Freq: Once | ORAL | Status: AC
  Administered 2012-12-03: 1 via ORAL
  Filled 2012-12-03: qty 1

## 2012-12-03 MED ORDER — OXYCODONE-ACETAMINOPHEN 5-325 MG PO TABS: 1.0000 | ORAL_TABLET | Freq: Four times a day (QID) | ORAL | Status: DC | PRN

## 2012-12-03 MED ORDER — SODIUM CHLORIDE 0.9 % IV SOLN
INTRAVENOUS | Status: DC
  Administered 2012-12-03: 22:00:00 via INTRAVENOUS

## 2012-12-03 NOTE — ED Notes (Signed)
abd pain , passing "mucus in my stools", Felt weak and fell today, Has abrasion to nose, pain rt shoulder , and rt hip, rt, elbow.  No LOC, alert,

## 2012-12-03 NOTE — ED Provider Notes (Signed)
History   This chart was scribed for [name], MD by Marin Comment, ED Scribe. The patient was seen in room APA03/APA03. Patient's care was started at 2043.   CSN: [id number]  Arrival date & time 12/03/12  1933   First MD Initiated Contact with Patient 12/03/12 2043      Chief Complaint  Patient presents with  . Abdominal Pain    The history is provided by the patient and a relative. No language interpreter was used.   Amy [name] is a 79 y.o. female who presents to the Emergency Department complaining of persistent, gradually worsening lower abdominal pain. She states that she has had abdominal soreness over the past 2 weeks. She reports associated diarrhea that started yesterday and has been passing mucous. She reports 6-8 episodes of diarrhea today. She denies any nausea or vomiting. She states that she went to the bathroom and after walking back she fell 2 hours ago. She states she was ambulating with walker when she felt like she was going to pass out. She states she went down to the ground and doesn't know how she injured herself. She has a has an abrasion to her nose and now has right shoulder pain, right wrist pain and increased right hip pain. She has a h/o hip replacement on the right and the left. She was previously dx with an UTI and has finished a 7 day course of Cipro 3 days ago. She states she still has urinary frequency and urgency. She denies any h/o similar symptoms. She states she has had a normal appetite.  PCP: Dr. Neva Seat Dr [facility] Dr Burney Gauze  Past Medical History  Diagnosis Date  . Hypertension   . Hypercholesteremia   . Myocardial infarction     Inferior STEMI 07/2008 - PCI of prox RCA followed byu CABG x 3 in 9/'09  . Pneumonia   . Anxiety   . CAD in native artery     s/p CABG x 3; Last Myoview in 07/2009 - non-ischemic; Echo 03/2012  Aortic Sclerosis with normal EF.  Marland Kitchen Asthma   . Pacemaker   . GERD (gastroesophageal reflux  disease)   . Arthritis   . Status post hip hemiarthroplasty left hip by Dr Ronnie Derby 08/08/2012    Past Surgical History  Procedure Date  . Coronary artery bypass graft 2009    LIMA-LAD, SVG-RI, SVG-stented RCA  . Total hip arthroplasty   . Pacemaker insertion   . Insert / replace / remove pacemaker   . Wrist fracture surgery 07/2012    left intra articular  with carpel tunnel  . Carpal tunnel release 08/08/2012    Procedure: CARPAL TUNNEL RELEASE;  Surgeon: Schuyler Amor, MD;  Location: [place];  Service: Orthopedics;  Laterality: Left;  . Hip arthroplasty 08/08/2012    Procedure: ARTHROPLASTY BIPOLAR HIP;  Surgeon: Rudean Haskell, MD;  Location: [place];  Service: Orthopedics;  Laterality: Left;  [place]   . Esophagogastroduodenoscopy 08/10/2012    Procedure: ESOPHAGOGASTRODUODENOSCOPY (EGD);  Surgeon: Beryle Beams, MD;  Location: [facility] END[facility]COPY;  Service: Endoscopy;  Laterality: N/A;  . Esophagoscopy 08/10/2012    Procedure: ESOPHAGOSCOPY;  Surgeon: [name], MD;  Location: [place];  Service: ENT;  Laterality: N/A;  . Gastrostomy 08/16/2012    Procedure: GASTROSTOMY;  Surgeon: Zenovia Jarred, MD;  Location: [place];  Service: General;  Laterality: N/A;  . Joint replacement     Family History  Problem Relation Age of  Onset  . Hypertension Mother   . Diabetes Mother   . Cancer Mother   . Hypertension Father   . Stroke Father     History  Substance Use Topics  . Smoking status: Never Smoker   . Smokeless tobacco: Never Used  . Alcohol Use: No  She lives at home Daughter lives with patient Uses a walker   OB History    Grav Para Term Preterm Abortions TAB SAB Ect Mult Living                  Review of Systems  Gastrointestinal: Positive for abdominal pain and diarrhea. Negative for nausea and vomiting.  Genitourinary: Positive for urgency and frequency. Negative for dysuria.  Musculoskeletal: Positive for arthralgias. Negative for joint swelling.  All other  systems reviewed and are negative.    Allergies  Hydrocodone-acetaminophen  Home Medications   Current Outpatient Rx  Name  Route  Sig  Dispense  Refill  . ACETAMINOPHEN 500 MG PO TABS   Oral   Take 1,000 mg by mouth every 6 (six) hours as needed. Pain         . ASPIRIN 81 MG PO TABS   Oral   Take 81 mg by mouth daily.         . COLCHICINE 0.6 MG PO TABS   Oral   Take 1 tablet (0.6 mg total) by mouth once.   1 tablet   0   . DIPHENOXYLATE-ATROPINE 2.5-0.025 MG PO TABS   Oral   Take 1 tablet by mouth 3 (three) times daily.         Marland Kitchen ENOXAPARIN SODIUM 40 MG/0.4ML Apex SOLN   Subcutaneous   Inject 0.4 mLs (40 mg total) into the skin every 12 (twelve) hours.   10 Syringe   0     Last injection 08/22/12   . FAMOTIDINE 20 MG PO TABS   Oral   Take 20 mg by mouth 2 (two) times daily.         . FUROSEMIDE 20 MG PO TABS   Oral   Take 20 mg by mouth 2 (two) times daily.         . INSULIN ASPART 100 UNIT/ML  SOLN   Subcutaneous   Inject 0-9 Units into the skin every 4 (four) hours.   1 vial   0   . LEVOTHYROXINE SODIUM 25 MCG PO TABS   Oral   Take 25 mcg by mouth daily.         Marland Kitchen LORAZEPAM 1 MG PO TABS   Oral   Take 1 mg by mouth at bedtime.         Marland Kitchen JEVITY 1.2 CAL PO LIQD   Per Tube   Place 1,000 mLs into feeding tube continuous.         . OMEPRAZOLE 20 MG PO CPDR   Oral   Take 20 mg by mouth daily.         Marland Kitchen POTASSIUM CHLORIDE CRYS ER 20 MEQ PO TBCR   Oral   Take 20 mEq by mouth 2 (two) times daily.         . SUCRALFATE 1 G PO TABS   Oral   Take 1 g by mouth 3 (three) times daily.           Triage Vitals: BP 154/75  Pulse 102  Temp 98.1 F (36.7 C) (Oral)  Resp 20  Ht 5' 5.5" (1.664 m)  Wt 142 lb (  64.411 kg)  BMI 23.27 kg/m2  SpO2 96%  Vital signs normal except tachycardia   Physical Exam  Nursing note and vitals reviewed. Constitutional: She is oriented to person, place, and time. She appears well-developed and  well-nourished. No distress.  HENT:  Head: Normocephalic.  Right Ear: External ear normal.  Left Ear: External ear normal.  Mouth/Throat: Oropharynx is clear and moist. No oropharyngeal exudate.       Abrasion to the left side of the nasal bridge. Nasal spine is non-tender.   Eyes: Conjunctivae normal and EOM are normal. Pupils are equal, round, and reactive to light.  Neck: Normal range of motion. Neck supple. No tracheal deviation present.  Cardiovascular: Normal rate, regular rhythm and normal heart sounds.  Exam reveals no gallop and no friction rub.   No murmur heard. Pulmonary/Chest: Effort normal and breath sounds normal. No respiratory distress. She has no wheezes. She has no rhonchi. She has no rales.  Abdominal: Soft. Bowel sounds are normal. She exhibits no distension. There is tenderness. There is no rebound and no guarding.       Mild tenderness over the suprapubic area.   Musculoskeletal: Normal range of motion. She exhibits tenderness. She exhibits no edema.       Bruising to anterior right shoulder, right clavicle is non-tender. Tender over the lateral right pelvis. Intact flexion of the knee and hip on the right.  Has pain in her right wrist without deformity, intact pulses.   Neurological: She is alert and oriented to person, place, and time.  Skin: Skin is warm and dry.          Psychiatric: She has a normal mood and affect. Her behavior is normal.    ED Course  Procedures (including critical care time)   Medications  0.9 %  sodium chloride infusion (0  Intravenous Stopped 12/04/12 0004)  sodium chloride 0.9 % bolus 500 mL (0 mL Intravenous Stopped 12/03/12 2222)  oxyCODONE-acetaminophen (PERCOCET/ROXICET) 5-325 MG per tablet 1 tablet (1 tablet Oral Given 12/03/12 2137)  cephALEXin (KEFLEX) capsule 500 mg (500 mg Oral Given 12/04/12 0003)     DIAGNOSTIC STUDIES: Oxygen Saturation is 96% on room air, adequate by my interpretation.    COORDINATION OF  CARE:  21:15-Discussed planned course of treatment with the patient including x-ray of right shoulder, hip and nasal bones, UA and basic blood work, who is agreeable at this time.   Pt placed in a velcro wrist splint. She was able to stand up after getting IV fluids with minimal assistance.    Results for orders placed during the hospital encounter of 12/03/12  URINALYSIS, ROUTINE W REFLEX MICROSCOPIC      Component Value Range   Color, Urine YELLOW  YELLOW   APPearance CLEAR  CLEAR   Specific Gravity, Urine 1.015  1.005 - 1.030   pH 6.0  5.0 - 8.0   Glucose, UA NEGATIVE  NEGATIVE mg/dL   Hgb urine dipstick TRACE (*) NEGATIVE   Bilirubin Urine NEGATIVE  NEGATIVE   Ketones, ur NEGATIVE  NEGATIVE mg/dL   Protein, ur NEGATIVE  NEGATIVE mg/dL   Urobilinogen, UA 0.2  0.0 - 1.0 mg/dL   Nitrite NEGATIVE  NEGATIVE   Leukocytes, UA SMALL (*) NEGATIVE  CBC WITH DIFFERENTIAL      Component Value Range   WBC 12.4 (*) 4.0 - 10.5 K/uL   RBC 4.48  3.87 - 5.11 MIL/uL   Hemoglobin 12.9  12.0 - 15.0 g/dL   HCT 39.6  36.0 - 46.0 %   MCV 88.4  78.0 - 100.0 fL   MCH 28.8  26.0 - 34.0 pg   MCHC 32.6  30.0 - 36.0 g/dL   RDW 13.4  11.5 - 15.5 %   Platelets 202  150 - 400 K/uL   Neutrophils Relative 86 (*) 43 - 77 %   Neutro Abs 10.6 (*) 1.7 - 7.7 K/uL   Lymphocytes Relative 9 (*) 12 - 46 %   Lymphs Abs 1.2  0.7 - 4.0 K/uL   Monocytes Relative 5  3 - 12 %   Monocytes Absolute 0.6  0.1 - 1.0 K/uL   Eosinophils Relative 0  0 - 5 %   Eosinophils Absolute 0.0  0.0 - 0.7 K/uL   Basophils Relative 0  0 - 1 %   Basophils Absolute 0.0  0.0 - 0.1 K/uL  COMPREHENSIVE METABOLIC PANEL      Component Value Range   Sodium 136  135 - 145 mEq/L   Potassium 4.9  3.5 - 5.1 mEq/L   Chloride 97  96 - 112 mEq/L   CO2 31  19 - 32 mEq/L   Glucose, Bld 118 (*) 70 - 99 mg/dL   BUN 20  6 - 23 mg/dL   Creatinine, Ser 1.20 (*) 0.50 - 1.10 mg/dL   Calcium 10.4  8.4 - 10.5 mg/dL   Total Protein 7.5  6.0 - 8.3 g/dL    Albumin 3.6  3.5 - 5.2 g/dL   AST 32  0 - 37 U/L   ALT 16  0 - 35 U/L   Alkaline Phosphatase 113  39 - 117 U/L   Total Bilirubin 0.3  0.3 - 1.2 mg/dL   GFR calc non Af Amer 39 (*) >90 mL/min   GFR calc Af Amer 45 (*) >90 mL/min  TROPONIN I      Component Value Range   Troponin I <0.30  <0.30 ng/mL  URINE MICROSCOPIC-ADD ON      Component Value Range   Squamous Epithelial / LPF MANY (*) RARE   WBC, UA TOO NUMEROUS TO COUNT  <3 WBC/hpf   Bacteria, UA FEW (*) RARE   Laboratory interpretation all normal except renal insufficiency, leukocytosis    Dg Nasal Bones  12/03/2012  *RADIOLOGY REPORT*  Clinical Data: Abrasions across the nose after fall.  NASAL BONES - 3+ VIEW  Comparison: CT head 08/07/2012  Findings: Opacification of the inferior aspect of the right maxillary antrum suggesting retention cyst.  The nasal bones appear intact. Nasal septum is midline.  No evidence of fracture or displacement.  Visualized orbital rims and facial bones are not displaced.  IMPRESSION: Nasal bones appear intact.  No depressed fractures.  Probable retention cyst in the right maxillary antrum.   Original Report Authenticated By: Lucienne Capers, M.D.    Dg Shoulder Right  12/03/2012  *RADIOLOGY REPORT*  Clinical Data: Right shoulder pain after fall.  RIGHT SHOULDER - 2+ VIEW  Comparison: None.  Findings: Degenerative changes are present in the acromioclavicular and glenohumeral joints.  There is prominent sub acromial spur formation.  No evidence of acute fracture or subluxation of the right shoulder.  No focal bone lesion or bone destruction. Visualized portions of the right ribs are intact.  IMPRESSION: Degenerative changes in the right shoulder.  No acute bony abnormalities appreciated.   Original Report Authenticated By: Lucienne Capers, M.D.    Dg Wrist Complete Right  12/03/2012  *RADIOLOGY REPORT*  Clinical Data: Right wrist pain  after fall.  RIGHT WRIST - COMPLETE 3+ VIEW  Comparison: None.  Findings:  Degenerative changes in the radial carpal, STT, and carpometacarpal joints.  No evidence of acute fracture or subluxation.  No focal bone lesion or bone destruction.  Bone cortex and trabecular architecture appear intact.  IMPRESSION: Degenerative changes in the right wrist.  No displaced fractures identified.   Original Report Authenticated By: Lucienne Capers, M.D.    Dg Hip Complete Right  12/03/2012  *RADIOLOGY REPORT*  Clinical Data: Right hip pain after fall.  RIGHT HIP - COMPLETE 2+ VIEW  Comparison: 08/13/2008  Findings: Postoperative changes with right total hip and left hip hemiarthroplasties.  The femoral component of the right hip prosthesis is cemented and there are two acetabular screws present. Components appear well seated.  No significant lucency at the hardware interfaces.  No evidence of acute fracture or subluxation.  The left hip hemiarthroplasty is noncemented.  Components appear well seated.  No evidence of acute fracture or subluxation.  The pelvic ring appears intact.  The SI joints and symphysis pubis are not displaced.  IMPRESSION: Postoperative changes in both hips.  Hardware components appear well seated.  No acute bony abnormalities are suggested.   Original Report Authenticated By: Lucienne Capers, M.D.       Date: 12/03/2012  Rate: 92  Rhythm: normal sinus rhythm  QRS Axis: normal  Intervals: normal  ST/T Wave abnormalities: normal  Conduction Disutrbances:none  Narrative Interpretation: PRWP  Old EKG Reviewed: unchanged 09/03/2012    1. Fall   2. Contusion of shoulder, right   3. Contusion of nose   4. Wrist pain, left   5. Hip pain, right   6. Weakness   7. Mucus in stool   8. UTI (lower urinary tract infection)    New Prescriptions   CEPHALEXIN (KEFLEX) 500 MG CAPSULE    Take 1 capsule (500 mg total) by mouth 3 (three) times daily.   METHOCARBAMOL (ROBAXIN) 500 MG TABLET    Take 1 tablet (500 mg total) by mouth 3 (three) times daily.    OXYCODONE-ACETAMINOPHEN (PERCOCET/ROXICET) 5-325 MG PER TABLET    Take 1 tablet by mouth every 6 (six) hours as needed for pain.    Plan discharge  Rolland Porter, MD, FACEP    MDM     I personally performed the services described in this documentation, which was scribed in my presence. The recorded information has been reviewed and considered.  Rolland Porter, MD, FACEP     [name], MD 12/04/12 [phone]

## 2012-12-04 DIAGNOSIS — N390 Urinary tract infection, site not specified: Secondary | ICD-10-CM | POA: Diagnosis not present

## 2012-12-05 DIAGNOSIS — Z471 Aftercare following joint replacement surgery: Secondary | ICD-10-CM | POA: Diagnosis not present

## 2012-12-05 DIAGNOSIS — M159 Polyosteoarthritis, unspecified: Secondary | ICD-10-CM | POA: Diagnosis not present

## 2012-12-05 DIAGNOSIS — K219 Gastro-esophageal reflux disease without esophagitis: Secondary | ICD-10-CM | POA: Diagnosis not present

## 2012-12-05 DIAGNOSIS — I10 Essential (primary) hypertension: Secondary | ICD-10-CM | POA: Diagnosis not present

## 2012-12-05 DIAGNOSIS — Z950 Presence of cardiac pacemaker: Secondary | ICD-10-CM | POA: Diagnosis not present

## 2012-12-05 DIAGNOSIS — I2510 Atherosclerotic heart disease of native coronary artery without angina pectoris: Secondary | ICD-10-CM | POA: Diagnosis not present

## 2012-12-06 ENCOUNTER — Inpatient Hospital Stay (HOSPITAL_COMMUNITY)
Admission: EM | Admit: 2012-12-06 | Discharge: 2012-12-08 | DRG: 392 | Disposition: A | Discharge status: Home Health | Payer: Medicare Other | Attending: Internal Medicine | Admitting: Internal Medicine

## 2012-12-06 ENCOUNTER — Emergency Department (HOSPITAL_COMMUNITY): Payer: Medicare Other

## 2012-12-06 ENCOUNTER — Encounter (HOSPITAL_COMMUNITY): Payer: Self-pay | Admitting: Emergency Medicine

## 2012-12-06 DIAGNOSIS — S52502A Unspecified fracture of the lower end of left radius, initial encounter for closed fracture: Secondary | ICD-10-CM

## 2012-12-06 DIAGNOSIS — R1310 Dysphagia, unspecified: Secondary | ICD-10-CM

## 2012-12-06 DIAGNOSIS — Z96649 Presence of unspecified artificial hip joint: Secondary | ICD-10-CM

## 2012-12-06 DIAGNOSIS — K5732 Diverticulitis of large intestine without perforation or abscess without bleeding: Secondary | ICD-10-CM | POA: Diagnosis not present

## 2012-12-06 DIAGNOSIS — N390 Urinary tract infection, site not specified: Secondary | ICD-10-CM | POA: Diagnosis not present

## 2012-12-06 DIAGNOSIS — K5792 Diverticulitis of intestine, part unspecified, without perforation or abscess without bleeding: Secondary | ICD-10-CM

## 2012-12-06 DIAGNOSIS — I252 Old myocardial infarction: Secondary | ICD-10-CM

## 2012-12-06 DIAGNOSIS — I480 Paroxysmal atrial fibrillation: Secondary | ICD-10-CM | POA: Diagnosis present

## 2012-12-06 DIAGNOSIS — N183 Chronic kidney disease, stage 3 unspecified: Secondary | ICD-10-CM | POA: Diagnosis present

## 2012-12-06 DIAGNOSIS — E7800 Pure hypercholesterolemia, unspecified: Secondary | ICD-10-CM | POA: Diagnosis present

## 2012-12-06 DIAGNOSIS — I2510 Atherosclerotic heart disease of native coronary artery without angina pectoris: Secondary | ICD-10-CM

## 2012-12-06 DIAGNOSIS — M129 Arthropathy, unspecified: Secondary | ICD-10-CM | POA: Diagnosis present

## 2012-12-06 DIAGNOSIS — I4891 Unspecified atrial fibrillation: Secondary | ICD-10-CM | POA: Diagnosis present

## 2012-12-06 DIAGNOSIS — I10 Essential (primary) hypertension: Secondary | ICD-10-CM | POA: Diagnosis present

## 2012-12-06 DIAGNOSIS — J9600 Acute respiratory failure, unspecified whether with hypoxia or hypercapnia: Secondary | ICD-10-CM | POA: Diagnosis not present

## 2012-12-06 DIAGNOSIS — I358 Other nonrheumatic aortic valve disorders: Secondary | ICD-10-CM

## 2012-12-06 DIAGNOSIS — Z09 Encounter for follow-up examination after completed treatment for conditions other than malignant neoplasm: Secondary | ICD-10-CM

## 2012-12-06 DIAGNOSIS — N1832 Chronic kidney disease, stage 3b: Secondary | ICD-10-CM | POA: Diagnosis present

## 2012-12-06 DIAGNOSIS — Z931 Gastrostomy status: Secondary | ICD-10-CM

## 2012-12-06 DIAGNOSIS — E039 Hypothyroidism, unspecified: Secondary | ICD-10-CM | POA: Diagnosis present

## 2012-12-06 DIAGNOSIS — I129 Hypertensive chronic kidney disease with stage 1 through stage 4 chronic kidney disease, or unspecified chronic kidney disease: Secondary | ICD-10-CM | POA: Diagnosis present

## 2012-12-06 DIAGNOSIS — J811 Chronic pulmonary edema: Secondary | ICD-10-CM

## 2012-12-06 DIAGNOSIS — E785 Hyperlipidemia, unspecified: Secondary | ICD-10-CM

## 2012-12-06 DIAGNOSIS — F411 Generalized anxiety disorder: Secondary | ICD-10-CM | POA: Diagnosis present

## 2012-12-06 DIAGNOSIS — S72002A Fracture of unspecified part of neck of left femur, initial encounter for closed fracture: Secondary | ICD-10-CM

## 2012-12-06 DIAGNOSIS — Z86718 Personal history of other venous thrombosis and embolism: Secondary | ICD-10-CM

## 2012-12-06 DIAGNOSIS — I495 Sick sinus syndrome: Secondary | ICD-10-CM | POA: Diagnosis present

## 2012-12-06 DIAGNOSIS — K219 Gastro-esophageal reflux disease without esophagitis: Secondary | ICD-10-CM | POA: Diagnosis present

## 2012-12-06 DIAGNOSIS — J9601 Acute respiratory failure with hypoxia: Secondary | ICD-10-CM

## 2012-12-06 DIAGNOSIS — Z8701 Personal history of pneumonia (recurrent): Secondary | ICD-10-CM

## 2012-12-06 DIAGNOSIS — I82409 Acute embolism and thrombosis of unspecified deep veins of unspecified lower extremity: Secondary | ICD-10-CM

## 2012-12-06 DIAGNOSIS — Z950 Presence of cardiac pacemaker: Secondary | ICD-10-CM

## 2012-12-06 DIAGNOSIS — J45909 Unspecified asthma, uncomplicated: Secondary | ICD-10-CM | POA: Diagnosis present

## 2012-12-06 DIAGNOSIS — D62 Acute posthemorrhagic anemia: Secondary | ICD-10-CM | POA: Diagnosis not present

## 2012-12-06 DIAGNOSIS — J9851 Mediastinitis: Secondary | ICD-10-CM

## 2012-12-06 DIAGNOSIS — Z951 Presence of aortocoronary bypass graft: Secondary | ICD-10-CM

## 2012-12-06 DIAGNOSIS — Z87440 Personal history of urinary (tract) infections: Secondary | ICD-10-CM

## 2012-12-06 DIAGNOSIS — R0989 Other specified symptoms and signs involving the circulatory and respiratory systems: Secondary | ICD-10-CM

## 2012-12-06 DIAGNOSIS — Z01818 Encounter for other preprocedural examination: Secondary | ICD-10-CM

## 2012-12-06 DIAGNOSIS — S52123A Displaced fracture of head of unspecified radius, initial encounter for closed fracture: Secondary | ICD-10-CM

## 2012-12-06 DIAGNOSIS — Z7982 Long term (current) use of aspirin: Secondary | ICD-10-CM

## 2012-12-06 DIAGNOSIS — R0609 Other forms of dyspnea: Secondary | ICD-10-CM

## 2012-12-06 DIAGNOSIS — Z79899 Other long term (current) drug therapy: Secondary | ICD-10-CM

## 2012-12-06 DIAGNOSIS — Z87898 Personal history of other specified conditions: Secondary | ICD-10-CM

## 2012-12-06 DIAGNOSIS — R109 Unspecified abdominal pain: Secondary | ICD-10-CM | POA: Diagnosis present

## 2012-12-06 HISTORY — DX: Chronic kidney disease, stage 3 (moderate): N18.3

## 2012-12-06 HISTORY — DX: Chronic kidney disease, stage 3 unspecified: N18.30

## 2012-12-06 HISTORY — DX: Mediastinitis: J98.51

## 2012-12-06 HISTORY — DX: Perforation of esophagus: K22.3

## 2012-12-06 HISTORY — DX: Paroxysmal atrial fibrillation: I48.0

## 2012-12-06 HISTORY — DX: Deep phlebothrombosis in pregnancy, unspecified trimester: O22.30

## 2012-12-06 LAB — COMPREHENSIVE METABOLIC PANEL
ALT: 19 U/L (ref 0–35)
AST: 31 U/L (ref 0–37)
Alkaline Phosphatase: 118 U/L — ABNORMAL HIGH (ref 39–117)
CO2: 32 mEq/L (ref 19–32)
Chloride: 98 mEq/L (ref 96–112)
Creatinine, Ser: 1.14 mg/dL — ABNORMAL HIGH (ref 0.50–1.10)
GFR calc non Af Amer: 41 mL/min — ABNORMAL LOW (ref >90)
Potassium: 4 mEq/L (ref 3.5–5.1)
Sodium: 139 mEq/L (ref 135–145)
Total Bilirubin: 0.3 mg/dL (ref 0.3–1.2)

## 2012-12-06 LAB — URINALYSIS, ROUTINE W REFLEX MICROSCOPIC
Bilirubin Urine: NEGATIVE
Glucose, UA: NEGATIVE mg/dL
Ketones, ur: NEGATIVE mg/dL
Protein, ur: >300 mg/dL — AB
pH: 7.5 (ref 5.0–8.0)

## 2012-12-06 LAB — URINE MICROSCOPIC-ADD ON

## 2012-12-06 LAB — CBC WITH DIFFERENTIAL/PLATELET
Eosinophils Absolute: 0.2 10*3/uL (ref 0.0–0.7)
HCT: 36.3 % (ref 36.0–46.0)
Hemoglobin: 11.8 g/dL — ABNORMAL LOW (ref 12.0–15.0)
Lymphocytes Relative: 17 % (ref 12–46)
Lymphs Abs: 1.3 10*3/uL (ref 0.7–4.0)
MCHC: 32.5 g/dL (ref 30.0–36.0)
MCV: 89 fL (ref 78.0–100.0)
Neutro Abs: 5.8 10*3/uL (ref 1.7–7.7)
Neutrophils Relative %: 73 % (ref 43–77)
Platelets: 213 10*3/uL (ref 150–400)
RDW: 13.3 % (ref 11.5–15.5)

## 2012-12-06 LAB — OCCULT BLOOD, POC DEVICE: Fecal Occult Bld: POSITIVE — AB

## 2012-12-06 MED ORDER — HYDROMORPHONE HCL PF 1 MG/ML IJ SOLN: 0.5000 mg | INTRAMUSCULAR | Status: DC | PRN

## 2012-12-06 MED ORDER — METOPROLOL TARTRATE 25 MG PO TABS
25.0000 mg | ORAL_TABLET | Freq: Every day | ORAL | Status: DC
  Administered 2012-12-06 – 2012-12-08 (×3): 25 mg via ORAL
  Filled 2012-12-06 (×3): qty 1

## 2012-12-06 MED ORDER — LEVOTHYROXINE SODIUM 25 MCG PO TABS
25.0000 ug | ORAL_TABLET | Freq: Every day | ORAL | Status: DC
  Administered 2012-12-07 – 2012-12-08 (×2): 25 ug via ORAL
  Filled 2012-12-06 (×2): qty 1

## 2012-12-06 MED ORDER — CIPROFLOXACIN IN D5W 400 MG/200ML IV SOLN
400.0000 mg | Freq: Two times a day (BID) | INTRAVENOUS | Status: DC
  Administered 2012-12-06 – 2012-12-08 (×4): 400 mg via INTRAVENOUS
  Filled 2012-12-06 (×8): qty 200

## 2012-12-06 MED ORDER — ACETAMINOPHEN 650 MG RE SUPP: 650.0000 mg | Freq: Four times a day (QID) | RECTAL | Status: DC | PRN

## 2012-12-06 MED ORDER — IOHEXOL 300 MG/ML  SOLN
50.0000 mL | Freq: Once | INTRAMUSCULAR | Status: AC | PRN
  Administered 2012-12-06: 50 mL via ORAL

## 2012-12-06 MED ORDER — ONDANSETRON HCL 4 MG PO TABS: 4.0000 mg | ORAL_TABLET | Freq: Four times a day (QID) | ORAL | Status: DC | PRN

## 2012-12-06 MED ORDER — SODIUM CHLORIDE 0.9 % IV SOLN
1000.0000 mL | INTRAVENOUS | Status: DC
  Administered 2012-12-06: 1000 mL via INTRAVENOUS

## 2012-12-06 MED ORDER — SODIUM CHLORIDE 0.9 % IV SOLN
1000.0000 mL | INTRAVENOUS | Status: DC
  Administered 2012-12-06 (×2): 1000 mL via INTRAVENOUS

## 2012-12-06 MED ORDER — ENOXAPARIN SODIUM 40 MG/0.4ML ~~LOC~~ SOLN
40.0000 mg | SUBCUTANEOUS | Status: DC
  Administered 2012-12-06 – 2012-12-07 (×2): 40 mg via SUBCUTANEOUS
  Filled 2012-12-06 (×2): qty 0.4

## 2012-12-06 MED ORDER — ACETAMINOPHEN 325 MG PO TABS
650.0000 mg | ORAL_TABLET | Freq: Four times a day (QID) | ORAL | Status: DC | PRN
  Administered 2012-12-07: 650 mg via ORAL
  Filled 2012-12-06: qty 2

## 2012-12-06 MED ORDER — METRONIDAZOLE IN NACL 5-0.79 MG/ML-% IV SOLN
500.0000 mg | Freq: Three times a day (TID) | INTRAVENOUS | Status: DC
  Administered 2012-12-06 – 2012-12-08 (×6): 500 mg via INTRAVENOUS
  Filled 2012-12-06 (×12): qty 100

## 2012-12-06 MED ORDER — METOPROLOL TARTRATE 1 MG/ML IV SOLN: 2.5000 mg | Freq: Three times a day (TID) | INTRAVENOUS | Status: DC

## 2012-12-06 MED ORDER — ONDANSETRON HCL 4 MG/2ML IJ SOLN
4.0000 mg | Freq: Four times a day (QID) | INTRAMUSCULAR | Status: DC | PRN
  Administered 2012-12-07: 4 mg via INTRAVENOUS
  Filled 2012-12-06: qty 2

## 2012-12-06 MED ORDER — SODIUM CHLORIDE 0.9 % IV SOLN: INTRAVENOUS | Status: DC

## 2012-12-06 MED ORDER — IOHEXOL 300 MG/ML  SOLN
100.0000 mL | Freq: Once | INTRAMUSCULAR | Status: AC | PRN
  Administered 2012-12-06: 100 mL via INTRAVENOUS

## 2012-12-06 NOTE — ED Provider Notes (Signed)
History   This chart was scribed for Amy Latina III, MD, by Joline Maxcy, ER scribe. The patient was seen in room APA08/APA08 and the patient's care was started at 0949.    CSN: [id number]  Arrival date & time 12/06/12  0930   First MD Initiated Contact with Patient 12/06/12 [phone]      Chief Complaint  Patient presents with  . Diarrhea    (Consider location/radiation/quality/duration/timing/severity/associated sxs/prior treatment) HPI  Amy [name] is a 79 y.o. female who presents to the Emergency Department complaining of sudden onset, moderate mucus stools with intermittent abdominal cramping, cough, and hematochezia that began 5 days ago. She reports that she has been having flare up with her hemorrhoids recently and believes the bleeding is associated from that. She denies any fever, emesis, ear pain,sore throat, chest pain, rash, syncope, or seizures. She reports that she was seen in the ED on 02/03 for a fall and has a contusion to her right shoulder and her nose. She was also diagnosed with a UTI and has been taking keflex, but reports she has not taken any of her medications yet today. She has a h/o of a broken left hip and arm in 10/13. During the surgery, her esophagus was torn, which also has to be repaired. She reports that she has been recovering in the [facility] until recently. She reports that she is allergic to hydrocodone and Vicodin and has a h/o of hypertension.   PCP is Dr. Hilma Favors.   Past Medical History  Diagnosis Date  . Hypertension   . Hypercholesteremia   . Myocardial infarction     Inferior STEMI 07/2008 - PCI of prox RCA followed byu CABG x 3 in 9/'09  . Pneumonia   . Anxiety   . CAD in native artery     s/p CABG x 3; Last Myoview in 07/2009 - non-ischemic; Echo 03/2012  Aortic Sclerosis with normal EF.  Marland Kitchen Asthma   . Pacemaker   . GERD (gastroesophageal reflux disease)   . Arthritis   . Status post hip hemiarthroplasty left hip by Dr Ronnie Derby  08/08/2012    Past Surgical History  Procedure Date  . Coronary artery bypass graft 2009    LIMA-LAD, SVG-RI, SVG-stented RCA  . Total hip arthroplasty   . Pacemaker insertion   . Insert / replace / remove pacemaker   . Wrist fracture surgery 07/2012    left intra articular  with carpel tunnel  . Carpal tunnel release 08/08/2012    Procedure: CARPAL TUNNEL RELEASE;  Surgeon: Schuyler Amor, MD;  Location: [place];  Service: Orthopedics;  Laterality: Left;  . Hip arthroplasty 08/08/2012    Procedure: ARTHROPLASTY BIPOLAR HIP;  Surgeon: Rudean Haskell, MD;  Location: [place];  Service: Orthopedics;  Laterality: Left;  [place]   . Esophagogastroduodenoscopy 08/10/2012    Procedure: ESOPHAGOGASTRODUODENOSCOPY (EGD);  Surgeon: Beryle Beams, MD;  Location: [facility] ENDOSCOPY;  Service: Endoscopy;  Laterality: N/A;  . Esophagoscopy 08/10/2012    Procedure: ESOPHAGOSCOPY;  Surgeon: [name], MD;  Location: [place];  Service: ENT;  Laterality: N/A;  . Gastrostomy 08/16/2012    Procedure: GASTROSTOMY;  Surgeon: Zenovia Jarred, MD;  Location: [place];  Service: General;  Laterality: N/A;  . Joint replacement     Family History  Problem Relation Age of Onset  . Hypertension Mother   . Diabetes Mother   . Cancer Mother   . Hypertension Father   . Stroke Father  History  Substance Use Topics  . Smoking status: Never Smoker   . Smokeless tobacco: Never Used  . Alcohol Use: No    OB History    Grav Para Term Preterm Abortions TAB SAB Ect Mult Living                  Review of Systems  Constitutional: Negative for fever.  HENT: Negative for ear pain and sore throat.   Respiratory: Positive for cough.   Cardiovascular: Negative for chest pain.  Gastrointestinal: Positive for abdominal pain and blood in stool. Negative for vomiting.       Mucus in stool  Skin: Negative for rash.  Neurological: Negative for seizures and syncope.  All other systems reviewed and are  negative.    Allergies  Hydrocodone-acetaminophen  Home Medications   Current Outpatient Rx  Name  Route  Sig  Dispense  Refill  . ASPIRIN 81 MG PO TABS   Oral   Take 81 mg by mouth daily.         . CEPHALEXIN 500 MG PO CAPS   Oral   Take 1 capsule (500 mg total) by mouth 3 (three) times daily.   21 capsule   0   . FUROSEMIDE 20 MG PO TABS   Oral   Take 20 mg by mouth daily as needed. For fluid retention. To take based on weight gain. Weight to be checked every morning         . LEVOTHYROXINE SODIUM 25 MCG PO TABS   Oral   Take 25 mcg by mouth daily.         Marland Kitchen LORAZEPAM 1 MG PO TABS   Oral   Take 0.5-1 mg by mouth at bedtime as needed. Sleep         . METOPROLOL TARTRATE 25 MG PO TABS   Oral   Take 25 mg by mouth daily.         Marland Kitchen OMEPRAZOLE 20 MG PO CPDR   Oral   Take 20 mg by mouth 2 (two) times daily.          . OXYCODONE-ACETAMINOPHEN 5-325 MG PO TABS   Oral   Take 1 tablet by mouth every 6 (six) hours as needed for pain.   6 tablet   0   . POTASSIUM CHLORIDE CRYS ER 20 MEQ PO TBCR   Oral   Take 20 mEq by mouth 2 (two) times daily. *To be taken only if Lasix (Furosemide) is taken for fluid retention         . SUCRALFATE 1 G PO TABS   Oral   Take 0.5 g by mouth 3 (three) times daily.            BP 147/70  Pulse 83  Temp 97.6 F (36.4 C) (Oral)  Resp 20  Wt 142 lb (64.411 kg)  SpO2 98%  Physical Exam  Nursing note and vitals reviewed. Constitutional: She is oriented to person, place, and time. She appears well-developed and well-nourished. No distress.  HENT:  Head: Normocephalic and atraumatic.  Eyes: EOM are normal. Pupils are equal, round, and reactive to light.  Neck: Normal range of motion. Neck supple. No tracheal deviation present.  Cardiovascular: Normal rate.   Pulmonary/Chest: Effort normal. No respiratory distress.  Abdominal: Soft. She exhibits no distension.  Genitourinary: Rectal exam shows no mass.        Chaperoned rectal exam. No tenderness or masses noted. No blood in stool. Mucoid appearance.  Musculoskeletal: Normal range of motion. She exhibits no edema.  Neurological: She is alert and oriented to person, place, and time.  Skin: Skin is warm and dry.  Psychiatric: She has a normal mood and affect. Her behavior is normal.    ED Course  Procedures (including critical care time)  DIAGNOSTIC STUDIES: Oxygen Saturation is 90% on room air, adequate by my interpretation.    COORDINATION OF CARE:  10:12- Discussed planned course of treatment with the patient, including an abdominal CT with contrast, blood work, and a UA, who is agreeable at this time.  10:44- Medication Orders- iohexol (omnipaque) 300 mg/ml solution 50 mL-once.  11:58- Medication Orders- iohexol (omnipaque) 300 mg/ml solution 100 mL- once.  Results for orders placed during the hospital encounter of 12/06/12  COMPREHENSIVE METABOLIC PANEL      Component Value Range   Sodium 139  135 - 145 mEq/L   Potassium 4.0  3.5 - 5.1 mEq/L   Chloride 98  96 - 112 mEq/L   CO2 32  19 - 32 mEq/L   Glucose, Bld 98  70 - 99 mg/dL   BUN 19  6 - 23 mg/dL   Creatinine, Ser 1.14 (*) 0.50 - 1.10 mg/dL   Calcium 10.0  8.4 - 10.5 mg/dL   Total Protein 6.8  6.0 - 8.3 g/dL   Albumin 3.1 (*) 3.5 - 5.2 g/dL   AST 31  0 - 37 U/L   ALT 19  0 - 35 U/L   Alkaline Phosphatase 118 (*) 39 - 117 U/L   Total Bilirubin 0.3  0.3 - 1.2 mg/dL   GFR calc non Af Amer 41 (*) >90 mL/min   GFR calc Af Amer 48 (*) >90 mL/min  LIPASE, BLOOD      Component Value Range   Lipase 9 (*) 11 - 59 U/L  URINALYSIS, ROUTINE W REFLEX MICROSCOPIC      Component Value Range   Color, Urine YELLOW  YELLOW   APPearance HAZY (*) CLEAR   Specific Gravity, Urine 1.025  1.005 - 1.030   pH 7.5  5.0 - 8.0   Glucose, UA NEGATIVE  NEGATIVE mg/dL   Hgb urine dipstick LARGE (*) NEGATIVE   Bilirubin Urine NEGATIVE  NEGATIVE   Ketones, ur NEGATIVE  NEGATIVE mg/dL   Protein, ur  >300 (*) NEGATIVE mg/dL   Urobilinogen, UA 0.2  0.0 - 1.0 mg/dL   Nitrite NEGATIVE  NEGATIVE   Leukocytes, UA MODERATE (*) NEGATIVE  CBC WITH DIFFERENTIAL      Component Value Range   WBC 7.9  4.0 - 10.5 K/uL   RBC 4.08  3.87 - 5.11 MIL/uL   Hemoglobin 11.8 (*) 12.0 - 15.0 g/dL   HCT 36.3  36.0 - 46.0 %   MCV 89.0  78.0 - 100.0 fL   MCH 28.9  26.0 - 34.0 pg   MCHC 32.5  30.0 - 36.0 g/dL   RDW 13.3  11.5 - 15.5 %   Platelets 213  150 - 400 K/uL   Neutrophils Relative 73  43 - 77 %   Neutro Abs 5.8  1.7 - 7.7 K/uL   Lymphocytes Relative 17  12 - 46 %   Lymphs Abs 1.3  0.7 - 4.0 K/uL   Monocytes Relative 8  3 - 12 %   Monocytes Absolute 0.6  0.1 - 1.0 K/uL   Eosinophils Relative 2  0 - 5 %   Eosinophils Absolute 0.2  0.0 - 0.7 K/uL  Basophils Relative 0  0 - 1 %   Basophils Absolute 0.0  0.0 - 0.1 K/uL  OCCULT BLOOD, POC DEVICE      Component Value Range   Fecal Occult Bld POSITIVE (*) NEGATIVE  URINE MICROSCOPIC-ADD ON      Component Value Range   WBC, UA TOO NUMEROUS TO COUNT  <3 WBC/hpf   RBC / HPF TOO NUMEROUS TO COUNT  <3 RBC/hpf   Bacteria, UA MANY (*) RARE     Labs Reviewed  COMPREHENSIVE METABOLIC PANEL - Abnormal; Notable for the following:    Creatinine, Ser 1.14 (*)     Albumin 3.1 (*)     Alkaline Phosphatase 118 (*)     GFR calc non Af Amer 41 (*)     GFR calc Af Amer 48 (*)     All other components within normal limits  LIPASE, BLOOD - Abnormal; Notable for the following:    Lipase 9 (*)     All other components within normal limits  URINALYSIS, ROUTINE W REFLEX MICROSCOPIC - Abnormal; Notable for the following:    APPearance HAZY (*)     Hgb urine dipstick LARGE (*)     Protein, ur >300 (*)     Leukocytes, UA MODERATE (*)     All other components within normal limits  CBC WITH DIFFERENTIAL - Abnormal; Notable for the following:    Hemoglobin 11.8 (*)     All other components within normal limits  OCCULT BLOOD, POC DEVICE - Abnormal; Notable for the  following:    Fecal Occult Bld POSITIVE (*)     All other components within normal limits  URINE MICROSCOPIC-ADD ON - Abnormal; Notable for the following:    Bacteria, UA MANY (*)     All other components within normal limits  OCCULT BLOOD X 1 CARD TO LAB, STOOL  URINE CULTURE   Ct Abdomen Pelvis W Contrast  12/06/2012  *RADIOLOGY REPORT*  Clinical Data: Persistent diarrhea.  CT ABDOMEN AND PELVIS WITH CONTRAST  Technique:  Multidetector CT imaging of the abdomen and pelvis was performed following the standard protocol during bolus administration of intravenous contrast.  Contrast: 98mL OMNIPAQUE IOHEXOL 300 MG/ML  SOLN, 123mL OMNIPAQUE IOHEXOL 300 MG/ML  SOLN  Comparison: None.  Findings: The lung bases are clear.  No pleural effusion or worrisome pulmonary nodule.  The heart is borderline enlarged. Pacer wires are noted.  Dense aortic calcifications are noted.  The liver is unremarkable.  No focal hepatic lesions or intrahepatic biliary dilatation.  The gallbladder is normal.  No common bile duct dilatation.  The pancreas is unremarkable.  The spleen is normal in size.  No focal lesions.  The adrenal glands and kidneys are unremarkable except for multiple bilateral renal cysts.  The stomach, duodenum, small bowel and colon are unremarkable except for acute diverticulitis involving the lower sigmoid colon. No complicating features such as abscess or perforation. Examination in this area is somewhat limited by bilateral hip prosthesis.  The aorta demonstrates advanced atherosclerotic calcifications. The major branch vessels are patent.  No enlarged mesenteric or retroperitoneal lymph nodes or mass.  The uterus is surgically absent.  Both ovaries are still present.  IMPRESSION:  Uncomplicated diverticulitis involving the lower sigmoid colon.   Original Report Authenticated By: Marijo Sanes, M.D.    Lab tests show UTI.  CT of abdomen/pelvis shows diverticulitis.  Case discussed with Dr. Anastasio Champion --> admit to  a med-surg bed.  1. Diverticulitis   2. Urinary  tract infection      I personally performed the services described in this documentation, which was scribed in my presence. The recorded information has been reviewed and is accurate. Katy Apo, M.D.        Amy Latina III, MD 12/06/12 1346

## 2012-12-06 NOTE — ED Notes (Signed)
Pt states she has had muous stools since Sunday. Sometimes has blood in it. Pt seen here Monday for fall due to weakness. Diagnosed with kidney infection at that time. Pt continues to have diarrhea and weakness. Dizziness episodes at times.

## 2012-12-06 NOTE — ED Notes (Signed)
Attempted IV access x2 with no success.

## 2012-12-06 NOTE — H&P (Signed)
[facility] History and Physical  Amy M Devaux [id number] DOB: December 13, 1921 DOA: 12/06/2012  Referring physician:  PCP: Purvis Kilts, MD  Specialists:   Chief Complaint: abdominal pain/diarrhea  HPI: Amy [name] is a very pleasant  79 y.o. female pmhx that includes PAF, CAD s/p pacer, HTN, CKD III, DVT, left hip replacement 10/13, esophageal perforation 10/13 who presents to [place] [facility] ED from home with cc abdominal pain and diarrhea. Information obtained from patient and daughter who is at the bedside and lives with patient. Pt indicates 5 days ago experienced cramping with bowel movements. In addition, BM's were "mucosy and not much stool". She reports seeing blood yesterday but relates that she believes that was due to hemorrhoid. States pain is intermittent,  sharp, located in lower quadrants and cramp like. Bm's make pain worse.  During this time she denies having nausea/vomiting/bloating. She reports she does pass gas. She denies fever chills, headache. She does report having a fall 4 days ago and was seen in ED and diagnosed with UTI. She was given keflex and she has taken for 3 days. Symptoms came on suddenly and worsened and characterized as severe. Work up in ED yields abdominal CT with sigmoid diverticulitis. We are asked to admit    Review of Systems: The patient denies anorexia, fever, weight loss,, vision loss, decreased hearing, hoarseness, chest pain, syncope,  peripheral edema, hemoptysis, abdominal pain, melena, severe indigestion/heartburn, hematuria, incontinence, genital sores, muscle weakness, suspicious skin lesions, transient blindness, difficulty walking, depression, unusual weight change, abnormal bleeding, enlarged lymph nodes, angioedema, and breast masses.    Past Medical History  Diagnosis Date  . Hypertension   . Hypercholesteremia   . Myocardial infarction     Inferior STEMI 07/2008 - PCI of prox RCA followed byu CABG x 3 in 9/'09  . Pneumonia   .  Anxiety   . CAD in native artery     s/p CABG x 3; Last Myoview in 07/2009 - non-ischemic; Echo 03/2012  Aortic Sclerosis with normal EF.  Marland Kitchen Asthma   . Pacemaker   . GERD (gastroesophageal reflux disease)   . Arthritis   . Status post hip hemiarthroplasty left hip by Dr Ronnie Derby 08/08/2012  . PAF (paroxysmal atrial fibrillation)   . CKD (chronic kidney disease), stage III   . DVT (deep vein thrombosis) in pregnancy   . Esophageal perforation     9/13  . Mediastinitis     s/p drainage   Past Surgical History  Procedure Date  . Coronary artery bypass graft 2009    LIMA-LAD, SVG-RI, SVG-stented RCA  . Total hip arthroplasty   . Pacemaker insertion   . Insert / replace / remove pacemaker   . Wrist fracture surgery 07/2012    left intra articular  with carpel tunnel  . Carpal tunnel release 08/08/2012    Procedure: CARPAL TUNNEL RELEASE;  Surgeon: Schuyler Amor, MD;  Location: [place];  Service: Orthopedics;  Laterality: Left;  . Hip arthroplasty 08/08/2012    Procedure: ARTHROPLASTY BIPOLAR HIP;  Surgeon: Rudean Haskell, MD;  Location: [place];  Service: Orthopedics;  Laterality: Left;  [place]   . Esophagogastroduodenoscopy 08/10/2012    Procedure: ESOPHAGOGASTRODUODENOSCOPY (EGD);  Surgeon: Beryle Beams, MD;  Location: [facility] ENDOSCOPY;  Service: Endoscopy;  Laterality: N/A;  . Esophagoscopy 08/10/2012    Procedure: ESOPHAGOSCOPY;  Surgeon: [name], MD;  Location: [place];  Service: ENT;  Laterality: N/A;  . Gastrostomy 08/16/2012    Procedure: GASTROSTOMY;  Surgeon: Zenovia Jarred, MD;  Location: [place];  Service: General;  Laterality: N/A;  . Joint replacement    Social History:  reports that she has never smoked. She has never used smokeless tobacco. She reports that she does not drink alcohol or use illicit drugs. Pt lives with daughter in her home. Uses walker. Is still recovering from illness/surgery 5 months ago.  Allergies  Allergen Reactions  . Hydrocodone-Acetaminophen  Itching    Tolerates tylenol    Family History  Problem Relation Age of Onset  . Hypertension Mother   . Diabetes Mother   . Cancer Mother   . Hypertension Father   . Stroke Father      Prior to Admission medications   Medication Sig Start Date End Date Taking? Authorizing Provider  aspirin 81 MG tablet Take 81 mg by mouth daily.   Yes Historical Provider, MD  furosemide (LASIX) 20 MG tablet Take 20 mg by mouth daily as needed. For fluid retention. To take based on weight gain. Weight to be checked every morning   Yes Historical Provider, MD  levothyroxine (SYNTHROID) 25 MCG tablet Take 25 mcg by mouth daily.   Yes Historical Provider, MD  LORazepam (ATIVAN) 1 MG tablet Take 0.5-1 mg by mouth at bedtime as needed. Sleep   Yes Historical Provider, MD  metoprolol tartrate (LOPRESSOR) 25 MG tablet Take 25 mg by mouth daily.   Yes Historical Provider, MD  omeprazole (PRILOSEC) 20 MG capsule Take 20 mg by mouth 2 (two) times daily.    Yes Historical Provider, MD  oxyCODONE-acetaminophen (PERCOCET/ROXICET) 5-325 MG per tablet Take 1 tablet by mouth every 6 (six) hours as needed for pain. 12/03/12  Yes [name], MD  potassium chloride SA (K-DUR,KLOR-CON) 20 MEQ tablet Take 20 mEq by mouth 2 (two) times daily. *To be taken only if Lasix (Furosemide) is taken for fluid retention   Yes Historical Provider, MD  sucralfate (CARAFATE) 1 G tablet Take 0.5 g by mouth 3 (three) times daily.    Yes Historical Provider, MD   Physical Exam: Filed Vitals:   12/06/12 1129 12/06/12 1131 12/06/12 1331 12/06/12 1424  BP: 114/69 147/70 139/56 168/76  Pulse: 85 83 94 106  Temp:  97.6 F (36.4 C)  98 F (36.7 C)  TempSrc:  Oral  Oral  Resp: 16 20 20 20   Height:    5\' 4"  (1.626 m)  Weight:    65.772 kg (145 lb)  SpO2: 95% 98% 96% 98%     General:  Awake alert well nourished NAD  Eyes: PERRL EOMI no scleral icterus  ENT: ears clear, nose without drainage, mucus membranes moist/pink  Neck: supple  no JVD full rom  Cardiovascular: irregularly irregular No MGR No LEE  Respiratory: normal effort BSCTAB no wheeze  Abdomen: non-distended, soft, +BS x4, mild tenderness in lower quadrants to palpation. No guarding, rebound  Skin: warm/dry, no lesions/rash  Musculoskeletal: MOE No joint swelling/erythema non-tender to palpation  Psychiatric: calm, appropriate, cooperative  Neurologic: cranial nerve II-XII intact speech clear facial symmetry  Labs on Admission:  Basic Metabolic Panel:  Lab [id number] 12/03/12 2117  NA 139 136  K 4.0 4.9  CL 98 97  CO2 32 31  GLUCOSE 98 118*  BUN 19 20  CREATININE 1.14* 1.20*  CALCIUM 10.0 10.4  MG -- --  PHOS -- --   Liver Function Tests:  Lab 12/06/12 1044 12/03/12 2117  AST 31 32  ALT 19 16  ALKPHOS  118* 113  BILITOT 0.3 0.3  PROT 6.8 7.5  ALBUMIN 3.1* 3.6    Lab 12/06/12 1044  LIPASE 9*  AMYLASE --    CBC:  Lab 12/06/12 1044 12/03/12 2117  WBC 7.9 12.4*  NEUTROABS 5.8 10.6*  HGB 11.8* 12.9  HCT 36.3 39.6  MCV 89.0 88.4  PLT 213 202   Cardiac Enzymes:  Lab 12/03/12 2117  CKTOTAL --  CKMB --  CKMBINDEX --  TROPONINI <0.30    BNP (last 3 results)  Basename 09/07/12 0500 08/30/12 0500 08/28/12 1300  PROBNP 526.0* 3633.0* 5330.0*      Radiological Exams on Admission: Ct Abdomen Pelvis W Contrast  12/06/2012  *RADIOLOGY REPORT*  Clinical Data: Persistent diarrhea.  CT ABDOMEN AND PELVIS WITH CONTRAST  Technique:  Multidetector CT imaging of the abdomen and pelvis was performed following the standard protocol during bolus administration of intravenous contrast.  Contrast: 36mL OMNIPAQUE IOHEXOL 300 MG/ML  SOLN, 157mL OMNIPAQUE IOHEXOL 300 MG/ML  SOLN  Comparison: None.  Findings: The lung bases are clear.  No pleural effusion or worrisome pulmonary nodule.  The heart is borderline enlarged. Pacer wires are noted.  Dense aortic calcifications are noted.  The liver is unremarkable.  No focal hepatic lesions or  intrahepatic biliary dilatation.  The gallbladder is normal.  No common bile duct dilatation.  The pancreas is unremarkable.  The spleen is normal in size.  No focal lesions.  The adrenal glands and kidneys are unremarkable except for multiple bilateral renal cysts.  The stomach, duodenum, small bowel and colon are unremarkable except for acute diverticulitis involving the lower sigmoid colon. No complicating features such as abscess or perforation. Examination in this area is somewhat limited by bilateral hip prosthesis.  The aorta demonstrates advanced atherosclerotic calcifications. The major branch vessels are patent.  No enlarged mesenteric or retroperitoneal lymph nodes or mass.  The uterus is surgically absent.  Both ovaries are still present.  IMPRESSION:  Uncomplicated diverticulitis involving the lower sigmoid colon.   Original Report Authenticated By: Marijo Sanes, M.D.     EKG: Independently reviewed.   Assessment/Plan Principal Problem:  Sigmoid diverticulitis: acute diverticulitis of sigmoid without perforation per CT. Will admit to medical floor. Provide supportive therapy in form of NPO, IV fluids, pain medicine as needed, anti emetic as needed. Will provide cipro and flagyl IV . White count WNL, afebrile, lipase 9.  Non-toxic appearing.   Active Problems: Abdominal pain, acute: likely related to #1. Will provide pain med prn. NPO.   UTI (lower urinary tract infection): hx of same. Will gently hydrate. Will get culture of urine. Cipro until results.     Hypertension: Only fair control on admission. Will decrease IV fluids and monitor closely. Pt on BB at home as well as prn lasix. Will hold lasix for now and provide IV BB.   Chronic renal insufficiency, stage III (moderate), SCr 1.4 Sept 2013: current creatinine in baseline range. Will monitor.   PAF/SSS: s/p pacer. No chest pain . Will hold asa for now. Not on coumadin due to fall risk. Will provide BB  GERD: at baseline. Pt NPO  on admission. Will resume PPI once diet resumed.   Hypothyroidism: TSH 1.6 2 months ago. Will continue synthroid.       Code Status:  Family Communication: daughter at bedside Disposition Plan: home when ready  Time spent: 52 minutes  [facility]   If 7PM-7AM, please contact night-coverage [email or web address] Password [facility] 12/06/2012, 2:38 PM Attending: Patient  seen and examined. The above note reviewed.  Impression: 1. Sigmoid diverticulitis. Doubt that the patient has C. difficile colitis.  Plan: 1. Agree with intravenous antibiotics, ciprofloxacin and metronidazole.

## 2012-12-06 NOTE — Progress Notes (Signed)
UR Chart Review Completed  

## 2012-12-07 DIAGNOSIS — N183 Chronic kidney disease, stage 3 unspecified: Secondary | ICD-10-CM

## 2012-12-07 DIAGNOSIS — N390 Urinary tract infection, site not specified: Secondary | ICD-10-CM

## 2012-12-07 LAB — BASIC METABOLIC PANEL
Calcium: 9.4 mg/dL (ref 8.4–10.5)
Creatinine, Ser: 1.1 mg/dL (ref 0.50–1.10)
GFR calc Af Amer: 50 mL/min — ABNORMAL LOW (ref >90)
GFR calc non Af Amer: 43 mL/min — ABNORMAL LOW (ref >90)
Sodium: 136 mEq/L (ref 135–145)

## 2012-12-07 LAB — URINE CULTURE: Culture: NO GROWTH

## 2012-12-07 LAB — CBC
MCV: 88.2 fL (ref 78.0–100.0)
Platelets: 225 10*3/uL (ref 150–400)
RBC: 3.82 MIL/uL — ABNORMAL LOW (ref 3.87–5.11)
RDW: 13.4 % (ref 11.5–15.5)
WBC: 8.8 10*3/uL (ref 4.0–10.5)

## 2012-12-07 LAB — CLOSTRIDIUM DIFFICILE BY PCR: Toxigenic C. Difficile by PCR: NEGATIVE

## 2012-12-07 MED ORDER — METRONIDAZOLE IN NACL 5-0.79 MG/ML-% IV SOLN
INTRAVENOUS | Status: AC
  Filled 2012-12-07: qty 100

## 2012-12-07 MED ORDER — SODIUM CHLORIDE 0.9 % IV SOLN
1000.0000 mL | INTRAVENOUS | Status: DC
  Administered 2012-12-08: 1000 mL via INTRAVENOUS

## 2012-12-07 NOTE — Progress Notes (Addendum)
Text paged Dr. Ezzie Dural about the patients urine culture being positive for VRE.  Dr. Ezzie Dural responded by notifying me that he got the page.  No new orders was given at this time.  He also states that the person does not need to be on contact precautions at this time.  Due the colonies being low.

## 2012-12-07 NOTE — Progress Notes (Signed)
Subjective: This lady is better than yesterday, less cramping in her abdomen, she is tolerating a clear liquid diet. There is no nausea or vomiting. There is no fever. C. difficile toxin was negative by PCR.           Physical Exam: Blood pressure 146/71, pulse 72, temperature 98.5 F (36.9 C), temperature source Oral, resp. rate 20, height 5\' 4"  (1.626 m), weight 65.772 kg (145 lb), SpO2 94.00%. She looks systemically well. Abdomen is soft and now particularly nontender. There are no other new physical findings. She is alert and orientated.   Investigations:  Recent Results (from the past 240 hour(s))  URINE CULTURE     Status: Normal (Preliminary result)   Collection Time   12/03/12 11:34 PM      Component Value Range Status Comment   Specimen Description URINE, CLEAN CATCH   Final    Special Requests NONE   Final    Culture  Setup Time 12/04/2012 00:00   Final    Colony Count 30,000 COLONIES/ML   Final    Culture ENTEROCOCCUS SPECIES   Final    Report Status PENDING   Incomplete   CLOSTRIDIUM DIFFICILE BY PCR     Status: Normal   Collection Time   12/07/12  5:30 AM      Component Value Range Status Comment   C difficile by pcr NEGATIVE  NEGATIVE Final      Basic Metabolic Panel:  Basename 12/07/12 0525 12/06/12 1044  NA 136 139  K 3.7 4.0  CL 98 98  CO2 28 32  GLUCOSE 96 98  BUN 13 19  CREATININE 1.10 1.14*  CALCIUM 9.4 10.0  MG -- --  PHOS -- --   Liver Function Tests:  Basename 12/06/12 1044  AST 31  ALT 19  ALKPHOS 118*  BILITOT 0.3  PROT 6.8  ALBUMIN 3.1*     CBC:  Basename 12/07/12 0525 12/06/12 1044  WBC 8.8 7.9  NEUTROABS -- 5.8  HGB 11.0* 11.8*  HCT 33.7* 36.3  MCV 88.2 89.0  PLT 225 213    Ct Abdomen Pelvis W Contrast  12/06/2012  *RADIOLOGY REPORT*  Clinical Data: Persistent diarrhea.  CT ABDOMEN AND PELVIS WITH CONTRAST  Technique:  Multidetector CT imaging of the abdomen and pelvis was performed following the standard  protocol during bolus administration of intravenous contrast.  Contrast: 39mL OMNIPAQUE IOHEXOL 300 MG/ML  SOLN, 160mL OMNIPAQUE IOHEXOL 300 MG/ML  SOLN  Comparison: None.  Findings: The lung bases are clear.  No pleural effusion or worrisome pulmonary nodule.  The heart is borderline enlarged. Pacer wires are noted.  Dense aortic calcifications are noted.  The liver is unremarkable.  No focal hepatic lesions or intrahepatic biliary dilatation.  The gallbladder is normal.  No common bile duct dilatation.  The pancreas is unremarkable.  The spleen is normal in size.  No focal lesions.  The adrenal glands and kidneys are unremarkable except for multiple bilateral renal cysts.  The stomach, duodenum, small bowel and colon are unremarkable except for acute diverticulitis involving the lower sigmoid colon. No complicating features such as abscess or perforation. Examination in this area is somewhat limited by bilateral hip prosthesis.  The aorta demonstrates advanced atherosclerotic calcifications. The major branch vessels are patent.  No enlarged mesenteric or retroperitoneal lymph nodes or mass.  The uterus is surgically absent.  Both ovaries are still present.  IMPRESSION:  Uncomplicated diverticulitis involving the lower sigmoid colon.   Original  Report Authenticated By: Marijo Sanes, M.D.       Medications: I have reviewed the patient's current medications.  Impression: 1. Sigmoid diverticulitis, clinically improving. 2. Hypertension. 3. Possible UTI.     Plan: 1. Advance diet. 2. Continue with IV antibiotics. 3. Decrease IV fluids and encourage oral fluids. 4. Mobilize. 5. Probable discharge home tomorrow.     LOS: 1 day   Doree Albee Pager [phone]  12/07/2012, 8:28 AM

## 2012-12-07 NOTE — Care Management Note (Signed)
    Page 1 of 1   12/07/2012     1:50:49 PM   CARE MANAGEMENT NOTE 12/07/2012  Patient:  Amy [name], Amy [name]   Account Number:  [id number]  Date Initiated:  12/07/2012  Documentation initiated by:  Claretha Cooper  Subjective/Objective Assessment:   Pt admitted from home. States she already has Engineer, production and PT. Also has all the DME she needs.     Action/Plan:   DC home with existing services   Anticipated DC Date:  12/08/2012   Anticipated DC Plan:  [place]  CM consult      Choice offered to / List presented to:             Status of service:  Completed, signed off Medicare Important Message given?   (If response is "NO", the following Medicare IM given date fields will be blank) Date Medicare IM given:   Date Additional Medicare IM given:    Discharge Disposition:    Per UR Regulation:    If discussed at Long Length of Stay Meetings, dates discussed:    Comments:  12/07/12 Claretha Cooper RN BNS CM

## 2012-12-08 DIAGNOSIS — R109 Unspecified abdominal pain: Secondary | ICD-10-CM

## 2012-12-08 DIAGNOSIS — R52 Pain, unspecified: Secondary | ICD-10-CM

## 2012-12-08 LAB — CBC
HCT: 32.1 % — ABNORMAL LOW (ref 36.0–46.0)
Hemoglobin: 10.6 g/dL — ABNORMAL LOW (ref 12.0–15.0)
MCHC: 33 g/dL (ref 30.0–36.0)
RBC: 3.61 MIL/uL — ABNORMAL LOW (ref 3.87–5.11)
WBC: 5 10*3/uL (ref 4.0–10.5)

## 2012-12-08 LAB — BASIC METABOLIC PANEL
Chloride: 102 mEq/L (ref 96–112)
GFR calc non Af Amer: 45 mL/min — ABNORMAL LOW (ref >90)
Glucose, Bld: 100 mg/dL — ABNORMAL HIGH (ref 70–99)
Potassium: 3.6 mEq/L (ref 3.5–5.1)
Sodium: 139 mEq/L (ref 135–145)

## 2012-12-08 MED ORDER — METRONIDAZOLE 500 MG PO TABS: 500.0000 mg | ORAL_TABLET | Freq: Three times a day (TID) | ORAL | Status: DC

## 2012-12-08 MED ORDER — CIPROFLOXACIN HCL 500 MG PO TABS: 500.0000 mg | ORAL_TABLET | Freq: Two times a day (BID) | ORAL | Status: DC

## 2012-12-08 NOTE — Progress Notes (Signed)
Discharge instructions given to pt. With teach back given to RN. Pt. Taken to car via W/C. 

## 2012-12-08 NOTE — Discharge Summary (Signed)
Physician Discharge Summary  Amy [name] [id number] DOB: May 17, 1922 DOA: 12/06/2012  PCP: Purvis Kilts, MD  Admit date: 12/06/2012 Discharge date: 12/08/2012  Time spent: Greater than 30 minutes  Recommendations for Outpatient Follow-up:  1. Follow with primary care physician in the next couple of weeks. Repeat urine culture.   Discharge Diagnoses:  1. Sigmoid diverticulitis, improving. 2. Hypertension. 3. Recent UTI, VRE but repeat urine culture is actually negative.   Discharge Condition: Stable and improved.  Diet recommendation: As tolerated, advanced to a regular diet.  Filed Weights   12/06/12 0945 12/06/12 1424  Weight: 64.411 kg (142 lb) 65.772 kg (145 lb)    History of present illness:  This very pleasant 79 year old lady presents to the hospital with complaints of abdominal pain and diarrhea. Please see initial history as outlined below: HPI: Amy [name] is a very pleasant 79 y.o. 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 Information obtained from patient and daughter who is at the bedside and lives with patient. Pt indicates 5 days ago experienced cramping with bowel movements. In addition, BM's were "mucosy and not much stool". She reports seeing blood yesterday but relates that she believes that was due to hemorrhoid. States pain is intermittent, sharp, located in lower quadrants and cramp like. Bm's make pain worse. During this time she denies having nausea/vomiting/bloating. She reports she does pass gas. She denies fever chills, headache. She does report having a fall 4 days ago and was seen in ED and diagnosed with UTI. She was given keflex and she has taken for 3 days. Symptoms came on suddenly and worsened and characterized as severe. Work up in ED yields abdominal CT with sigmoid diverticulitis. We are  asked to admit   Hospital Course:  Patient was admitted and started on intravenous ciprofloxacin and metronidazole. C. difficile toxin by PCR was negative. She made a good and gradual improvement in her symptoms with her stool becoming more formed, without mucus. Her abdominal pain also has improved. She is able to tolerate a diet that has been advanced from a clear liquid diet. There is no nausea or vomiting. There is no fever. Repeat urine culture was negative. She is now stable to be discharged home. She will need further one week course of oral antibiotics.  Procedures:  None.   Consultations:  None.  Discharge Exam: Filed Vitals:   12/07/12 0511 12/07/12 1420 12/07/12 2100 12/08/12 0500  BP: 146/71 128/72 121/66 164/83  Pulse: 72 72 73 66  Temp: 98.5 F (36.9 C) 98.3 F (36.8 C) 98.5 F (36.9 C) 97.8 F (36.6 C)  TempSrc: Oral Oral Oral Oral  Resp: 20 20 20 18   Height:      Weight:      SpO2: 94% 94% 93% 94%    General: She looks systemically well. She is not toxic or septic. Cardiovascular: Heart sounds are present without murmurs or gallop rhythm. Respiratory: Lung fields are clear. Her abdomen is soft and nontender now.  Discharge Instructions  Discharge Orders   Future Orders Complete By Expires     Diet - low sodium heart healthy  As directed     Increase activity slowly  As directed         Medication List    STOP taking these medications       cephALEXin 500 MG  capsule  Commonly known as:  KEFLEX      TAKE these medications       aspirin 81 MG tablet  Take 81 mg by mouth daily.     ciprofloxacin 500 MG tablet  Commonly known as:  CIPRO  Take 1 tablet (500 mg total) by mouth 2 (two) times daily.     furosemide 20 MG tablet  Commonly known as:  LASIX  Take 20 mg by mouth daily as needed. For fluid retention. To take based on weight gain. Weight to be checked every morning     LORazepam 1 MG tablet  Commonly known as:  ATIVAN  Take 0.5-1 mg  by mouth at bedtime as needed. Sleep     metoprolol tartrate 25 MG tablet  Commonly known as:  LOPRESSOR  Take 25 mg by mouth daily.     metroNIDAZOLE 500 MG tablet  Commonly known as:  FLAGYL  Take 1 tablet (500 mg total) by mouth 3 (three) times daily.     omeprazole 20 MG capsule  Commonly known as:  PRILOSEC  Take 20 mg by mouth 2 (two) times daily.     oxyCODONE-acetaminophen 5-325 MG per tablet  Commonly known as:  PERCOCET/ROXICET  Take 1 tablet by mouth every 6 (six) hours as needed for pain.     potassium chloride SA 20 MEQ tablet  Commonly known as:  K-DUR,KLOR-CON  Take 20 mEq by mouth 2 (two) times daily. *To be taken only if Lasix (Furosemide) is taken for fluid retention     sucralfate 1 G tablet  Commonly known as:  CARAFATE  Take 0.5 g by mouth 3 (three) times daily.     SYNTHROID 25 MCG tablet  Generic drug:  levothyroxine  Take 25 mcg by mouth daily.          The results of significant diagnostics from this hospitalization (including imaging, microbiology, ancillary and laboratory) are listed below for reference.    Significant Diagnostic Studies: Dg Nasal Bones  12/03/2012  *RADIOLOGY REPORT*  Clinical Data: Abrasions across the nose after fall.  NASAL BONES - 3+ VIEW  Comparison: CT head 08/07/2012  Findings: Opacification of the inferior aspect of the right maxillary antrum suggesting retention cyst.  The nasal bones appear intact. Nasal septum is midline.  No evidence of fracture or displacement.  Visualized orbital rims and facial bones are not displaced.  IMPRESSION: Nasal bones appear intact.  No depressed fractures.  Probable retention cyst in the right maxillary antrum.   Original Report Authenticated By: Lucienne Capers, M.D.    Dg Shoulder Right  12/03/2012  *RADIOLOGY REPORT*  Clinical Data: Right shoulder pain after fall.  RIGHT SHOULDER - 2+ VIEW  Comparison: None.  Findings: Degenerative changes are present in the acromioclavicular and  glenohumeral joints.  There is prominent sub acromial spur formation.  No evidence of acute fracture or subluxation of the right shoulder.  No focal bone lesion or bone destruction. Visualized portions of the right ribs are intact.  IMPRESSION: Degenerative changes in the right shoulder.  No acute bony abnormalities appreciated.   Original Report Authenticated By: Lucienne Capers, M.D.    Dg Wrist Complete Right  12/03/2012  *RADIOLOGY REPORT*  Clinical Data: Right wrist pain after fall.  RIGHT WRIST - COMPLETE 3+ VIEW  Comparison: None.  Findings: Degenerative changes in the radial carpal, STT, and carpometacarpal joints.  No evidence of acute fracture or subluxation.  No focal bone lesion or bone destruction.  Bone cortex  and trabecular architecture appear intact.  IMPRESSION: Degenerative changes in the right wrist.  No displaced fractures identified.   Original Report Authenticated By: Lucienne Capers, M.D.    Dg Hip Complete Right  12/03/2012  *RADIOLOGY REPORT*  Clinical Data: Right hip pain after fall.  RIGHT HIP - COMPLETE 2+ VIEW  Comparison: 08/13/2008  Findings: Postoperative changes with right total hip and left hip hemiarthroplasties.  The femoral component of the right hip prosthesis is cemented and there are two acetabular screws present. Components appear well seated.  No significant lucency at the hardware interfaces.  No evidence of acute fracture or subluxation.  The left hip hemiarthroplasty is noncemented.  Components appear well seated.  No evidence of acute fracture or subluxation.  The pelvic ring appears intact.  The SI joints and symphysis pubis are not displaced.  IMPRESSION: Postoperative changes in both hips.  Hardware components appear well seated.  No acute bony abnormalities are suggested.   Original Report Authenticated By: Lucienne Capers, M.D.    Ct Abdomen Pelvis W Contrast  12/06/2012  *RADIOLOGY REPORT*  Clinical Data: Persistent diarrhea.  CT ABDOMEN AND PELVIS WITH  CONTRAST  Technique:  Multidetector CT imaging of the abdomen and pelvis was performed following the standard protocol during bolus administration of intravenous contrast.  Contrast: 31mL OMNIPAQUE IOHEXOL 300 MG/ML  SOLN, 164mL OMNIPAQUE IOHEXOL 300 MG/ML  SOLN  Comparison: None.  Findings: The lung bases are clear.  No pleural effusion or worrisome pulmonary nodule.  The heart is borderline enlarged. Pacer wires are noted.  Dense aortic calcifications are noted.  The liver is unremarkable.  No focal hepatic lesions or intrahepatic biliary dilatation.  The gallbladder is normal.  No common bile duct dilatation.  The pancreas is unremarkable.  The spleen is normal in size.  No focal lesions.  The adrenal glands and kidneys are unremarkable except for multiple bilateral renal cysts.  The stomach, duodenum, small bowel and colon are unremarkable except for acute diverticulitis involving the lower sigmoid colon. No complicating features such as abscess or perforation. Examination in this area is somewhat limited by bilateral hip prosthesis.  The aorta demonstrates advanced atherosclerotic calcifications. The major branch vessels are patent.  No enlarged mesenteric or retroperitoneal lymph nodes or mass.  The uterus is surgically absent.  Both ovaries are still present.  IMPRESSION:  Uncomplicated diverticulitis involving the lower sigmoid colon.   Original Report Authenticated By: Marijo Sanes, M.D.     Microbiology: Recent Results (from the past 240 hour(s))  URINE CULTURE     Status: None   Collection Time    12/03/12 11:34 PM      Result Value Range Status   Specimen Description URINE, CLEAN CATCH   Final   Special Requests NONE   Final   Culture  Setup Time 12/04/2012 00:00   Final   Colony Count 30,000 COLONIES/ML   Final   Culture     Final   Value: VANCOMYCIN RESISTANT ENTEROCOCCUS ISOLATED     7 Note: CRITICAL RESULT CALLED TO, READ BACK BY AND VERIFIED WITH: TAMIKA W. ON 2 14 @ 1430 BY [place]    Report Status 12/07/2012 FINAL   Final   Organism ID, Bacteria VANCOMYCIN RESISTANT ENTEROCOCCUS ISOLATED   Final  URINE CULTURE     Status: None   Collection Time    12/06/12 11:24 AM      Result Value Range Status   Specimen Description URINE, CLEAN CATCH   Final   Special Requests  NONE   Final   Culture  Setup Time 12/06/2012 12:49   Final   Colony Count NO GROWTH   Final   Culture NO GROWTH   Final   Report Status 12/07/2012 FINAL   Final  CLOSTRIDIUM DIFFICILE BY PCR     Status: None   Collection Time    12/07/12  5:30 AM      Result Value Range Status   C difficile by pcr NEGATIVE  NEGATIVE Final     Labs: Basic Metabolic Panel:  Recent Labs Lab 12/03/12 2117 12/06/12 1044 12/07/12 0525 12/08/12 0600  NA 136 139 136 139  K 4.9 4.0 3.7 3.6  CL 97 98 98 102  CO2 31 32 28 28  GLUCOSE 118* 98 96 100*  BUN 20 19 13 9   CREATININE 1.20* 1.14* 1.10 1.07  CALCIUM 10.4 10.0 9.4 9.1   Liver Function Tests:  Recent Labs Lab 12/03/12 2117 12/06/12 1044  AST 32 31  ALT 16 19  ALKPHOS 113 118*  BILITOT 0.3 0.3  PROT 7.5 6.8  ALBUMIN 3.6 3.1*    Recent Labs Lab 12/06/12 1044  LIPASE 9*    CBC:  Recent Labs Lab 12/03/12 2117 12/06/12 1044 12/07/12 0525 12/08/12 0600  WBC 12.4* 7.9 8.8 5.0  NEUTROABS 10.6* 5.8  --   --   HGB 12.9 11.8* 11.0* 10.6*  HCT 39.6 36.3 33.7* 32.1*  MCV 88.4 89.0 88.2 88.9  PLT 202 213 225 193   Cardiac Enzymes:  Recent Labs Lab 12/03/12 2117  TROPONINI <0.30   BNP: BNP (last 3 results)  Recent Labs  08/28/12 1300 08/30/12 0500 09/07/12 0500  PROBNP 5330.0* 3633.0* 526.0*         Signed:  [place] C  [facility] 12/08/2012, 10:25 AM

## 2012-12-11 ENCOUNTER — Inpatient Hospital Stay (HOSPITAL_COMMUNITY)
Admission: EM | Admit: 2012-12-11 | Discharge: 2012-12-14 | DRG: 372 | Disposition: A | Discharge status: Home Health | Payer: Medicare Other | Attending: Internal Medicine | Admitting: Internal Medicine

## 2012-12-11 ENCOUNTER — Emergency Department (HOSPITAL_COMMUNITY): Payer: Medicare Other

## 2012-12-11 ENCOUNTER — Encounter (HOSPITAL_COMMUNITY): Payer: Self-pay | Admitting: Emergency Medicine

## 2012-12-11 DIAGNOSIS — Z950 Presence of cardiac pacemaker: Secondary | ICD-10-CM | POA: Diagnosis present

## 2012-12-11 DIAGNOSIS — R112 Nausea with vomiting, unspecified: Secondary | ICD-10-CM

## 2012-12-11 DIAGNOSIS — K5732 Diverticulitis of large intestine without perforation or abscess without bleeding: Secondary | ICD-10-CM | POA: Diagnosis present

## 2012-12-11 DIAGNOSIS — Z8701 Personal history of pneumonia (recurrent): Secondary | ICD-10-CM

## 2012-12-11 DIAGNOSIS — Z951 Presence of aortocoronary bypass graft: Secondary | ICD-10-CM

## 2012-12-11 DIAGNOSIS — E785 Hyperlipidemia, unspecified: Secondary | ICD-10-CM | POA: Diagnosis present

## 2012-12-11 DIAGNOSIS — R059 Cough, unspecified: Secondary | ICD-10-CM | POA: Diagnosis not present

## 2012-12-11 DIAGNOSIS — Z79899 Other long term (current) drug therapy: Secondary | ICD-10-CM

## 2012-12-11 DIAGNOSIS — K529 Noninfective gastroenteritis and colitis, unspecified: Secondary | ICD-10-CM

## 2012-12-11 DIAGNOSIS — K219 Gastro-esophageal reflux disease without esophagitis: Secondary | ICD-10-CM | POA: Diagnosis present

## 2012-12-11 DIAGNOSIS — I4891 Unspecified atrial fibrillation: Secondary | ICD-10-CM | POA: Diagnosis present

## 2012-12-11 DIAGNOSIS — N183 Chronic kidney disease, stage 3 unspecified: Secondary | ICD-10-CM | POA: Diagnosis not present

## 2012-12-11 DIAGNOSIS — I10 Essential (primary) hypertension: Secondary | ICD-10-CM | POA: Diagnosis present

## 2012-12-11 DIAGNOSIS — A0472 Enterocolitis due to Clostridium difficile, not specified as recurrent: Principal | ICD-10-CM

## 2012-12-11 DIAGNOSIS — J45909 Unspecified asthma, uncomplicated: Secondary | ICD-10-CM | POA: Diagnosis present

## 2012-12-11 DIAGNOSIS — R05 Cough: Secondary | ICD-10-CM | POA: Diagnosis not present

## 2012-12-11 DIAGNOSIS — R404 Transient alteration of awareness: Secondary | ICD-10-CM | POA: Diagnosis not present

## 2012-12-11 DIAGNOSIS — I2510 Atherosclerotic heart disease of native coronary artery without angina pectoris: Secondary | ICD-10-CM | POA: Diagnosis not present

## 2012-12-11 DIAGNOSIS — Z7982 Long term (current) use of aspirin: Secondary | ICD-10-CM

## 2012-12-11 DIAGNOSIS — R197 Diarrhea, unspecified: Secondary | ICD-10-CM | POA: Diagnosis not present

## 2012-12-11 DIAGNOSIS — N1832 Chronic kidney disease, stage 3b: Secondary | ICD-10-CM | POA: Diagnosis present

## 2012-12-11 DIAGNOSIS — E860 Dehydration: Secondary | ICD-10-CM | POA: Diagnosis not present

## 2012-12-11 DIAGNOSIS — M129 Arthropathy, unspecified: Secondary | ICD-10-CM | POA: Diagnosis present

## 2012-12-11 DIAGNOSIS — I252 Old myocardial infarction: Secondary | ICD-10-CM | POA: Diagnosis not present

## 2012-12-11 DIAGNOSIS — I129 Hypertensive chronic kidney disease with stage 1 through stage 4 chronic kidney disease, or unspecified chronic kidney disease: Secondary | ICD-10-CM | POA: Diagnosis present

## 2012-12-11 DIAGNOSIS — Z931 Gastrostomy status: Secondary | ICD-10-CM

## 2012-12-11 DIAGNOSIS — Z66 Do not resuscitate: Secondary | ICD-10-CM | POA: Diagnosis present

## 2012-12-11 DIAGNOSIS — E7800 Pure hypercholesterolemia, unspecified: Secondary | ICD-10-CM | POA: Diagnosis present

## 2012-12-11 DIAGNOSIS — R109 Unspecified abdominal pain: Secondary | ICD-10-CM

## 2012-12-11 DIAGNOSIS — R5381 Other malaise: Secondary | ICD-10-CM | POA: Diagnosis not present

## 2012-12-11 DIAGNOSIS — K5792 Diverticulitis of intestine, part unspecified, without perforation or abscess without bleeding: Secondary | ICD-10-CM

## 2012-12-11 LAB — COMPREHENSIVE METABOLIC PANEL
Albumin: 3 g/dL — ABNORMAL LOW (ref 3.5–5.2)
Alkaline Phosphatase: 102 U/L (ref 39–117)
BUN: 13 mg/dL (ref 6–23)
Potassium: 3.6 mEq/L (ref 3.5–5.1)
Sodium: 137 mEq/L (ref 135–145)
Total Protein: 6.5 g/dL (ref 6.0–8.3)

## 2012-12-11 LAB — URINE MICROSCOPIC-ADD ON

## 2012-12-11 LAB — URINALYSIS, ROUTINE W REFLEX MICROSCOPIC
Nitrite: NEGATIVE
Specific Gravity, Urine: >1.03 — ABNORMAL HIGH (ref 1.005–1.030)
pH: 5.5 (ref 5.0–8.0)

## 2012-12-11 LAB — CBC WITH DIFFERENTIAL/PLATELET
Hemoglobin: 12.2 g/dL (ref 12.0–15.0)
Lymphs Abs: 1.4 10*3/uL (ref 0.7–4.0)
Monocytes Relative: 7 % (ref 3–12)
Neutro Abs: 13.1 10*3/uL — ABNORMAL HIGH (ref 1.7–7.7)
Neutrophils Relative %: 84 % — ABNORMAL HIGH (ref 43–77)
RBC: 4.17 MIL/uL (ref 3.87–5.11)
WBC: 15.7 10*3/uL — ABNORMAL HIGH (ref 4.0–10.5)

## 2012-12-11 LAB — LIPASE, BLOOD: Lipase: 6 U/L — ABNORMAL LOW (ref 11–59)

## 2012-12-11 MED ORDER — CIPROFLOXACIN IN D5W 400 MG/200ML IV SOLN
400.0000 mg | Freq: Once | INTRAVENOUS | Status: AC
  Administered 2012-12-11: 400 mg via INTRAVENOUS
  Filled 2012-12-11: qty 200

## 2012-12-11 MED ORDER — ENOXAPARIN SODIUM 30 MG/0.3ML ~~LOC~~ SOLN
30.0000 mg | SUBCUTANEOUS | Status: DC
  Administered 2012-12-11 – 2012-12-12 (×2): 30 mg via SUBCUTANEOUS
  Filled 2012-12-11 (×2): qty 0.3

## 2012-12-11 MED ORDER — SODIUM CHLORIDE 0.9 % IV SOLN
INTRAVENOUS | Status: DC
  Administered 2012-12-11: 10:00:00 via INTRAVENOUS

## 2012-12-11 MED ORDER — DEXTROSE-NACL 5-0.9 % IV SOLN
INTRAVENOUS | Status: DC
  Administered 2012-12-11 – 2012-12-14 (×4): via INTRAVENOUS

## 2012-12-11 MED ORDER — METRONIDAZOLE IN NACL 5-0.79 MG/ML-% IV SOLN
500.0000 mg | Freq: Once | INTRAVENOUS | Status: AC
Start: 2012-12-11 — End: 2012-12-11
  Administered 2012-12-11: 500 mg via INTRAVENOUS
  Filled 2012-12-11: qty 100

## 2012-12-11 MED ORDER — ONDANSETRON HCL 4 MG/2ML IJ SOLN
4.0000 mg | Freq: Four times a day (QID) | INTRAMUSCULAR | Status: DC | PRN
  Administered 2012-12-11: 4 mg via INTRAVENOUS
  Filled 2012-12-11: qty 2

## 2012-12-11 MED ORDER — CIPROFLOXACIN IN D5W 200 MG/100ML IV SOLN
200.0000 mg | Freq: Two times a day (BID) | INTRAVENOUS | Status: DC
  Administered 2012-12-11 – 2012-12-12 (×2): 200 mg via INTRAVENOUS
  Filled 2012-12-11 (×4): qty 100

## 2012-12-11 MED ORDER — VANCOMYCIN 50 MG/ML ORAL SOLUTION
ORAL | Status: AC
  Filled 2012-12-11: qty 10

## 2012-12-11 MED ORDER — METRONIDAZOLE IN NACL 5-0.79 MG/ML-% IV SOLN
500.0000 mg | Freq: Three times a day (TID) | INTRAVENOUS | Status: DC
  Administered 2012-12-11 – 2012-12-12 (×3): 500 mg via INTRAVENOUS
  Filled 2012-12-11 (×6): qty 100

## 2012-12-11 MED ORDER — ONDANSETRON HCL 4 MG/2ML IJ SOLN
4.0000 mg | INTRAMUSCULAR | Status: DC | PRN
  Administered 2012-12-11: 4 mg via INTRAVENOUS
  Filled 2012-12-11: qty 2

## 2012-12-11 MED ORDER — VANCOMYCIN 50 MG/ML ORAL SOLUTION
125.0000 mg | Freq: Four times a day (QID) | ORAL | Status: DC
  Administered 2012-12-11 – 2012-12-14 (×12): 125 mg via ORAL
  Filled 2012-12-11 (×24): qty 2.5

## 2012-12-11 MED ORDER — IOHEXOL 300 MG/ML  SOLN: 50.0000 mL | Freq: Once | INTRAMUSCULAR | Status: AC | PRN

## 2012-12-11 MED ORDER — SODIUM CHLORIDE 0.9 % IV SOLN: INTRAVENOUS | Status: DC

## 2012-12-11 MED ORDER — MORPHINE SULFATE 2 MG/ML IJ SOLN: 1.0000 mg | INTRAMUSCULAR | Status: DC | PRN

## 2012-12-11 MED ORDER — ONDANSETRON HCL 4 MG PO TABS: 4.0000 mg | ORAL_TABLET | Freq: Four times a day (QID) | ORAL | Status: DC | PRN

## 2012-12-11 MED ORDER — IOHEXOL 300 MG/ML  SOLN
50.0000 mL | INTRAMUSCULAR | Status: AC
  Administered 2012-12-11 (×2): 50 mL via ORAL

## 2012-12-11 MED ORDER — ONDANSETRON HCL 4 MG/2ML IJ SOLN: 4.0000 mg | Freq: Three times a day (TID) | INTRAMUSCULAR | Status: DC | PRN

## 2012-12-11 NOTE — ED Provider Notes (Signed)
History     CSN: [id number]  Arrival date & time 12/11/12  [id number]   First MD Initiated Contact with Patient 12/11/12 0915      Chief Complaint  Patient presents with  . Abdominal Pain  . Emesis     HPI Pt was seen at 0920.   Per pt, c/o gradual onset and persistence of constant generalized abd "pain" for the past 3 days.  Has been associated with multiple intermittent episodes of N/V/D.  Describes the abd pain as "cramping."  Describes the stools as "mucus" and "watery."  Has had home fevers to "100.3." States she was admitted to the hospital last week for same symptoms, dx diverticulitis, rx abx .  States she was "feeling better for a few days" then "started to get worse again."  Denies back pain, no rash, no CP/SOB, no black or blood in stools or emesis.       Past Medical History  Diagnosis Date  . Hypertension   . Hypercholesteremia   . Myocardial infarction     Inferior STEMI 07/2008 - PCI of prox RCA followed byu CABG x 3 in 9/'09  . Pneumonia   . Anxiety   . CAD in native artery     s/p CABG x 3; Last Myoview in 07/2009 - non-ischemic; Echo 03/2012  Aortic Sclerosis with normal EF.  Marland Kitchen Asthma   . Pacemaker   . GERD (gastroesophageal reflux disease)   . Arthritis   . Status post hip hemiarthroplasty left hip by Dr Ronnie Derby 08/08/2012  . PAF (paroxysmal atrial fibrillation)   . CKD (chronic kidney disease), stage III   . DVT (deep vein thrombosis) in pregnancy   . Esophageal perforation     9/13  . Mediastinitis     s/p drainage    Past Surgical History  Procedure Laterality Date  . Coronary artery bypass graft  2009    LIMA-LAD, SVG-RI, SVG-stented RCA  . Total hip arthroplasty    . Pacemaker insertion    . Insert / replace / remove pacemaker    . Wrist fracture surgery  07/2012    left intra articular  with carpel tunnel  . Carpal tunnel release  08/08/2012    Procedure: CARPAL TUNNEL RELEASE;  Surgeon: Schuyler Amor, MD;  Location: [place];  Service:  Orthopedics;  Laterality: Left;  . Hip arthroplasty  08/08/2012    Procedure: ARTHROPLASTY BIPOLAR HIP;  Surgeon: Rudean Haskell, MD;  Location: [place];  Service: Orthopedics;  Laterality: Left;  [place]   . Esophagogastroduodenoscopy  08/10/2012    Procedure: ESOPHAGOGASTRODUODENOSCOPY (EGD);  Surgeon: Beryle Beams, MD;  Location: [facility] ENDOSCOPY;  Service: Endoscopy;  Laterality: N/A;  . Esophagoscopy  08/10/2012    Procedure: ESOPHAGOSCOPY;  Surgeon: [name], MD;  Location: [place];  Service: ENT;  Laterality: N/A;  . Gastrostomy  08/16/2012    Procedure: GASTROSTOMY;  Surgeon: Zenovia Jarred, MD;  Location: [place];  Service: General;  Laterality: N/A;  . Joint replacement      Family History  Problem Relation Age of Onset  . Hypertension Mother   . Diabetes Mother   . Cancer Mother   . Hypertension Father   . Stroke Father     History  Substance Use Topics  . Smoking status: Never Smoker   . Smokeless tobacco: Never Used  . Alcohol Use: No    Review of Systems ROS: Statement: All systems negative except as marked or noted in the HPI; Constitutional: +  fever and chills. ; ; Eyes: Negative for eye pain, redness and discharge. ; ; ENMT: Negative for ear pain, hoarseness, nasal congestion, sinus pressure and sore throat. ; ; Cardiovascular: Negative for chest pain, palpitations, diaphoresis, dyspnea and peripheral edema. ; ; Respiratory: Negative for cough, wheezing and stridor. ; ; Gastrointestinal: +abd pain, N/V/D. Negative blood in stool, hematemesis, jaundice and rectal bleeding. . ; ; Genitourinary: Negative for dysuria, flank pain and hematuria. ; ; Musculoskeletal: Negative for back pain and neck pain. Negative for swelling and trauma.; ; Skin: Negative for pruritus, rash, abrasions, blisters, bruising and skin lesion.; ; Neuro: Negative for headache, lightheadedness and neck stiffness. Negative for weakness, altered level of consciousness , altered mental status, extremity  weakness, paresthesias, involuntary movement, seizure and syncope.      Allergies  Hydrocodone-acetaminophen  Home Medications   Current Outpatient Rx  Name  Route  Sig  Dispense  Refill  . aspirin 81 MG tablet   Oral   Take 81 mg by mouth daily.         . ciprofloxacin (CIPRO) 500 MG tablet   Oral   Take 1 tablet (500 mg total) by mouth 2 (two) times daily.   14 tablet   0   . furosemide (LASIX) 20 MG tablet   Oral   Take 20 mg by mouth daily as needed. For fluid retention. To take based on weight gain. Weight to be checked every morning         . hydrocortisone cream (PREPARATION H HYDROCORTISONE) 1 %   Topical   Apply 1 application topically 3 (three) times daily as needed.         Marland Kitchen levothyroxine (SYNTHROID) 25 MCG tablet   Oral   Take 25 mcg by mouth daily.         Marland Kitchen LORazepam (ATIVAN) 1 MG tablet   Oral   Take 0.5-1 mg by mouth at bedtime as needed. Sleep         . metoprolol tartrate (LOPRESSOR) 25 MG tablet   Oral   Take 25 mg by mouth daily.         . metroNIDAZOLE (FLAGYL) 500 MG tablet   Oral   Take 1 tablet (500 mg total) by mouth 3 (three) times daily.   21 tablet   0   . omeprazole (PRILOSEC) 20 MG capsule   Oral   Take 20 mg by mouth 2 (two) times daily.          Marland Kitchen oxyCODONE-acetaminophen (PERCOCET/ROXICET) 5-325 MG per tablet   Oral   Take 1 tablet by mouth every 6 (six) hours as needed for pain.   6 tablet   0   . potassium chloride SA (K-DUR,KLOR-CON) 20 MEQ tablet   Oral   Take 20 mEq by mouth 2 (two) times daily. *To be taken only if Lasix (Furosemide) is taken for fluid retention         . sucralfate (CARAFATE) 1 G tablet   Oral   Take 0.5 g by mouth 3 (three) times daily.            BP 106/49  Pulse 72  Temp(Src) 98.2 F (36.8 C)  Resp 23  Ht 5\' 4"  (1.626 m)  Wt 145 lb (65.772 kg)  BMI 24.88 kg/m2  SpO2 94%  Physical Exam 0925: Physical examination:  Nursing notes reviewed; Vital signs and O2 SAT  reviewed;  Constitutional: Well developed, Well nourished, In no acute distress; Head:  Normocephalic, atraumatic;  Eyes: EOMI, PERRL, No scleral icterus; ENMT: Mouth and pharynx normal, Mucous membranes dry; Neck: Supple, Full range of motion, No lymphadenopathy; Cardiovascular: Regular rate and rhythm, No murmur, rub, or gallop; Respiratory: Breath sounds clear & equal bilaterally, No rales, rhonchi, wheezes.  Speaking full sentences with ease, Normal respiratory effort/excursion; Chest: Nontender, Movement normal; Abdomen: Soft, +diffuse tenderness esp LLQ. No rebound or guarding. Nondistended, Normal bowel sounds; Genitourinary: No CVA tenderness; Extremities: Pulses normal, No tenderness, No edema, No calf edema or asymmetry.; Neuro: AA&Ox3, Major CN grossly intact.  Speech clear. No gross focal motor or sensory deficits in extremities.; Skin: Color normal, Warm, Dry.   ED Course  Procedures     MDM  MDM Reviewed: previous chart, nursing note and vitals Reviewed previous: labs, ECG and CT scan Interpretation: labs, ECG, x-ray and CT scan    Date: 12/11/2012  Rate: 79  Rhythm: normal sinus rhythm  QRS Axis: normal  Intervals: normal  ST/T Wave abnormalities: nonspecific T wave changes  Conduction Disutrbances:none  Narrative Interpretation:   Old EKG Reviewed: unchanged; no significant changes from previous EKG dated 12/03/2012.  Results for orders placed during the hospital encounter of 12/11/12  COMPREHENSIVE METABOLIC PANEL      Result Value Range   Sodium 137  135 - 145 mEq/L   Potassium 3.6  3.5 - 5.1 mEq/L   Chloride 97  96 - 112 mEq/L   CO2 30  19 - 32 mEq/L   Glucose, Bld 110 (*) 70 - 99 mg/dL   BUN 13  6 - 23 mg/dL   Creatinine, Ser 1.27 (*) 0.50 - 1.10 mg/dL   Calcium 9.2  8.4 - 10.5 mg/dL   Total Protein 6.5  6.0 - 8.3 g/dL   Albumin 3.0 (*) 3.5 - 5.2 g/dL   AST 26  0 - 37 U/L   ALT 13  0 - 35 U/L   Alkaline Phosphatase 102  39 - 117 U/L   Total Bilirubin 0.3  0.3  - 1.2 mg/dL   GFR calc non Af Amer 36 (*) >90 mL/min   GFR calc Af Amer 42 (*) >90 mL/min  LIPASE, BLOOD      Result Value Range   Lipase 6 (*) 11 - 59 U/L  URINALYSIS, ROUTINE W REFLEX MICROSCOPIC      Result Value Range   Color, Urine YELLOW  YELLOW   APPearance CLEAR  CLEAR   Specific Gravity, Urine >1.030 (*) 1.005 - 1.030   pH 5.5  5.0 - 8.0   Glucose, UA NEGATIVE  NEGATIVE mg/dL   Hgb urine dipstick NEGATIVE  NEGATIVE   Bilirubin Urine SMALL (*) NEGATIVE   Ketones, ur NEGATIVE  NEGATIVE mg/dL   Protein, ur TRACE (*) NEGATIVE mg/dL   Urobilinogen, UA 0.2  0.0 - 1.0 mg/dL   Nitrite NEGATIVE  NEGATIVE   Leukocytes, UA TRACE (*) NEGATIVE  TROPONIN I      Result Value Range   Troponin I <0.30  <0.30 ng/mL  URINE MICROSCOPIC-ADD ON      Result Value Range   Squamous Epithelial / LPF FEW (*) RARE   WBC, UA 0-2  <3 WBC/hpf   RBC / HPF 0-2  <3 RBC/hpf   Bacteria, UA FEW (*) RARE  CBC WITH DIFFERENTIAL      Result Value Range   WBC 15.7 (*) 4.0 - 10.5 K/uL   RBC 4.17  3.87 - 5.11 MIL/uL   Hemoglobin 12.2  12.0 - 15.0 g/dL   HCT  37.3  36.0 - 46.0 %   MCV 89.4  78.0 - 100.0 fL   MCH 29.3  26.0 - 34.0 pg   MCHC 32.7  30.0 - 36.0 g/dL   RDW 13.8  11.5 - 15.5 %   Platelets 254  150 - 400 K/uL   Neutrophils Relative 84 (*) 43 - 77 %   Neutro Abs 13.1 (*) 1.7 - 7.7 K/uL   Lymphocytes Relative 9 (*) 12 - 46 %   Lymphs Abs 1.4  0.7 - 4.0 K/uL   Monocytes Relative 7  3 - 12 %   Monocytes Absolute 1.1 (*) 0.1 - 1.0 K/uL   Eosinophils Relative 0  0 - 5 %   Eosinophils Absolute 0.1  0.0 - 0.7 K/uL   Basophils Relative 0  0 - 1 %   Basophils Absolute 0.0  0.0 - 0.1 K/uL   Ct Abdomen Pelvis Wo Contrast 12/11/2012  *RADIOLOGY REPORT*  Clinical Data: Abdominal pain, fever, history of diverticulitis  CT ABDOMEN AND PELVIS WITHOUT CONTRAST  Technique:  Multidetector CT imaging of the abdomen and pelvis was performed following the standard protocol without intravenous contrast.   Comparison: 12/06/2012  Findings: Lung bases are unremarkable.  Sagittal images of the spine shows diffuse osteopenia.  Atherosclerotic calcifications abdominal aorta and the iliac arteries are again noted.  There are metallic artifacts from bilateral hip prosthesis limiting evaluation of the pelvis.  Unenhanced liver, spleen and adrenal glands are unremarkable. Again noted fatty replacement of the pancreas. No calcified gallstones are noted within gallbladder.  Unenhanced kidney shows no hydronephrosis or hydroureter.  A cortical calcification mid pole of the left kidney is stable from prior exam measures 3 mm. Probable bilateral renal cysts are stable from prior exam.  A small umbilical hernia containing fat again noted. Atherosclerotic calcifications of the abdominal aorta and the iliac arteries are again noted.  No calcified ureteral calculi are noted.  No small bowel obstruction.  No ascites or free air.  No adenopathy.  Again noted status post hysterectomy.  Again noted multiple sigmoid colon diverticula.  Mild pericolonic stranding consistent with mild diverticulitis distal sigmoid colon again noted.  No pericolonic abscess or perforation.  Since prior exam there is thickening of the mid transverse and left colonic wall.  This is best seen in the coronal image 27.  The findings are consistent with a long segment colitis.  Limited assessment of the urinary bladder due to metallic artifacts.  No pericecal inflammation.  Normal appendix is clearly visualized in axial image 52.  IMPRESSION:  1.  Again noted mild diverticulitis in the distal sigmoid colon. No pericolonic abscess or perforation.  There is  new mild thickening of the colonic wall distal transverse colon and left colon consistent with long segment colitis.  2.  No hydronephrosis or hydroureter.  Bilateral renal cysts are again noted. 3.  No pericecal inflammation.  Normal appendix is clearly visualized. 4.  Limited assessment of the pelvis due to  bilateral metallic artifacts from hip prosthesis.   Original Report Authenticated By: Lahoma Crocker, M.D.    Dg Chest 2 View 12/11/2012  *RADIOLOGY REPORT*  Clinical Data: Cough, rule out infiltrate  CHEST - 2 VIEW  Comparison: 08/30/2012  Findings: Cardiomediastinal silhouette is stable.  Status post CABG.  Dual lead cardiac pacemaker is unchanged in position.  No acute infiltrate or pulmonary edema.  Bony thorax is stable. Atherosclerotic calcifications of thoracic aorta again noted.  IMPRESSION: No active disease.  No significant change.  Status post CABG.   Original Report Authenticated By: Lahoma Crocker, M.D.      Results for Cowles, Niger M (MRN [id number]) as of 12/11/2012 14:51  Ref. Range 12/03/2012 21:17 12/06/2012 10:44 12/07/2012 05:25 12/08/2012 06:00 12/11/2012 09:57  BUN Latest Range: 6-23 mg/dL 20 19 13 9 13   Creatinine Latest Range: 0.50-1.10 mg/dL 1.20 (H) 1.14 (H) 1.10 1.07 1.27 (H)   Results for Hora, Niger M (MRN [id number]) as of 12/11/2012 14:51  Ref. Range 12/03/2012 21:17 12/06/2012 10:44 12/07/2012 05:25 12/08/2012 06:00 12/11/2012 09:57  WBC Latest Range: 4.0-10.5 K/uL 12.4 (H) 7.9 8.8 5.0 15.7 (H)     1445:  Diverticulitis without complication on CT, but now with new long segment colitis.  WBC and BUN/Cr elevated from previous.  Remains afebrile with stable VS in the ED.  Will start IV cipro and flagyl. Stool for cdiff and culture pending. Dx and testing d/w pt and family.  Questions answered.  Verb understanding, agreeable to admit.  T/C to [facility] [place] Pink, case discussed, including:  HPI, pertinent PM/SHx, VS/PE, dx testing, ED course and treatment:  Agreeable to inpt admit, requests to write temporary orders, obtain medical bed to team 2.            Alfonzo Feller, DO 12/13/12 2312

## 2012-12-11 NOTE — ED Notes (Signed)
Pt c/o lower abd pain with n/v/d. Recent diverticulosis.

## 2012-12-11 NOTE — H&P (Addendum)
[facility] History and Physical  Amy [name] [id number] DOB: 1921-12-21 DOA: 12/11/2012  Referring physician: Lurene Shadow, ER physician PCP: Purvis Kilts, MD  Specialists: None  Chief Complaint: Nausea vomiting diarrhea  HPI: Amy [name] is a 79 y.o. female  With past medical history of CAD and had been recently discharged from the hospitalist service for diverticulitis. She has discharged on by mouth antibiotics but continued to have episodes of left lower quadrant pain and started having recurrence of nausea and vomiting plus some episodes of diarrhea. Her symptoms persisting, she came back to the emergency room. She is noted to have a white count of 15 as well as a CT scan which confirmed evidence of recurrence of sigmoid diverticulitis. C. difficile PCR was sent. Hospitals were called for further evaluation and admission.  Review of Systems: Patient seen after arrival to the floor. She is doing okay, fatigued. Denies any headaches, vision changes, dysphasia. No appetite. No chest pain or palpitations no shortness of breath or wheezing or coughing. She does complain of some pain in the left lower quadrant with some generalized radiation. Has been having some episodes of diarrhea, but note constipation. Denies any hematuria or dysuria or focal extremity numbness or weakness or pain. Review systems is otherwise negative.  Past Medical History  Diagnosis Date  . Hypertension   . Hypercholesteremia   . Myocardial infarction     Inferior STEMI 07/2008 - PCI of prox RCA followed byu CABG x 3 in 9/'09  . Pneumonia   . Anxiety   . CAD in native artery     s/p CABG x 3; Last Myoview in 07/2009 - non-ischemic; Echo 03/2012  Aortic Sclerosis with normal EF.  Marland Kitchen Asthma   . Pacemaker   . GERD (gastroesophageal reflux disease)   . Arthritis   . Status post hip hemiarthroplasty left hip by Dr Ronnie Derby 08/08/2012  . PAF (paroxysmal atrial fibrillation)   . CKD (chronic kidney  disease), stage III   . DVT (deep vein thrombosis) in pregnancy   . Esophageal perforation     9/13  . Mediastinitis     s/p drainage   Past Surgical History  Procedure Laterality Date  . Coronary artery bypass graft  2009    LIMA-LAD, SVG-RI, SVG-stented RCA  . Total hip arthroplasty    . Pacemaker insertion    . Insert / replace / remove pacemaker    . Wrist fracture surgery  07/2012    left intra articular  with carpel tunnel  . Carpal tunnel release  08/08/2012    Procedure: CARPAL TUNNEL RELEASE;  Surgeon: Schuyler Amor, MD;  Location: [place];  Service: Orthopedics;  Laterality: Left;  . Hip arthroplasty  08/08/2012    Procedure: ARTHROPLASTY BIPOLAR HIP;  Surgeon: Rudean Haskell, MD;  Location: [place];  Service: Orthopedics;  Laterality: Left;  [place]   . Esophagogastroduodenoscopy  08/10/2012    Procedure: ESOPHAGOGASTRODUODENOSCOPY (EGD);  Surgeon: Beryle Beams, MD;  Location: [facility] ENDOSCOPY;  Service: Endoscopy;  Laterality: N/A;  . Esophagoscopy  08/10/2012    Procedure: ESOPHAGOSCOPY;  Surgeon: [name], MD;  Location: [place];  Service: ENT;  Laterality: N/A;  . Gastrostomy  08/16/2012    Procedure: GASTROSTOMY;  Surgeon: Zenovia Jarred, MD;  Location: [place];  Service: General;  Laterality: N/A;  . Joint replacement     Social History:  reports that she has never smoked. She has never used smokeless tobacco. She reports that she  does not drink alcohol or use illicit drugs. Patient lives at home with her daughter. She is able to ambulate some using a walker  Allergies  Allergen Reactions  . Hydrocodone-Acetaminophen Itching    Tolerates tylenol    Family History  Problem Relation Age of Onset  . Hypertension Mother   . Diabetes Mother   . Cancer Mother   . Hypertension Father   . Stroke Father     Prior to Admission medications   Medication Sig Start Date End Date Taking? Authorizing Provider  aspirin 81 MG tablet Take 81 mg by mouth daily.   Yes  Historical Provider, MD  ciprofloxacin (CIPRO) 500 MG tablet Take 1 tablet (500 mg total) by mouth 2 (two) times daily. 12/08/12  Yes Nimish Luther Parody, MD  furosemide (LASIX) 20 MG tablet Take 20 mg by mouth daily as needed. For fluid retention. To take based on weight gain. Weight to be checked every morning   Yes Historical Provider, MD  hydrocortisone cream (PREPARATION H HYDROCORTISONE) 1 % Apply 1 application topically 3 (three) times daily as needed.   Yes Historical Provider, MD  levothyroxine (SYNTHROID) 25 MCG tablet Take 25 mcg by mouth daily.   Yes Historical Provider, MD  LORazepam (ATIVAN) 1 MG tablet Take 0.5-1 mg by mouth at bedtime as needed. Sleep   Yes Historical Provider, MD  metoprolol tartrate (LOPRESSOR) 25 MG tablet Take 25 mg by mouth daily.   Yes Historical Provider, MD  metroNIDAZOLE (FLAGYL) 500 MG tablet Take 1 tablet (500 mg total) by mouth 3 (three) times daily. 12/08/12  Yes Nimish Luther Parody, MD  omeprazole (PRILOSEC) 20 MG capsule Take 20 mg by mouth 2 (two) times daily.    Yes Historical Provider, MD  oxyCODONE-acetaminophen (PERCOCET/ROXICET) 5-325 MG per tablet Take 1 tablet by mouth every 6 (six) hours as needed for pain. 12/03/12  Yes [name], MD  potassium chloride SA (K-DUR,KLOR-CON) 20 MEQ tablet Take 20 mEq by mouth 2 (two) times daily. *To be taken only if Lasix (Furosemide) is taken for fluid retention   Yes Historical Provider, MD  sucralfate (CARAFATE) 1 G tablet Take 0.5 g by mouth 3 (three) times daily.    Yes Historical Provider, MD   Physical Exam: Filed Vitals:   12/11/12 1421 12/11/12 1500 12/11/12 1541 12/11/12 1558  BP:  98/45 136/52 136/52  Pulse:  70 78 78  Temp:   98 F (36.7 C) 98 F (36.7 C)  TempSrc:    Oral  Resp: 23 23 18 20   Height:   5\' 5"  (1.651 m) 5\' 5"  (1.651 m)  Weight:   68.5 kg (151 lb 0.2 oz) 68.5 kg (151 lb 0.2 oz)  SpO2: 94% 91% 95% 95%     General:  Alert and oriented x3, mild distress secondary to pain in the left  lower quadrant  Eyes: Sclera nonicteric, extraocular movements are intact  ENT: Normocephalic, atraumatic, mucous membranes are slightly dry  Neck: Supple, no JVD  Cardiovascular: Regular rate and rhythm, S1-S2  Respiratory: Clear to auscultation bilaterally  Abdomen: Soft, some mild tenderness in the left lower quadrant, hypoactive bowel sounds, nondistended  Skin: No skin breaks, tears or lesions  Musculoskeletal: No clubbing or cyanosis or edema  Psychiatric: Patient is appropriate, no evidence of psychoses  Neurologic: No focal deficits  Labs on Admission:  Basic Metabolic Panel:  Recent Labs Lab 12/06/12 1044 12/07/12 0525 12/08/12 0600 12/11/12 0957  NA 139 136 139 137  K 4.0  3.7 3.6 3.6  CL 98 98 102 97  CO2 32 28 28 30   GLUCOSE 98 96 100* 110*  BUN 19 13 9 13   CREATININE 1.14* 1.10 1.07 1.27*  CALCIUM 10.0 9.4 9.1 9.2   Liver Function Tests:  Recent Labs Lab 12/06/12 1044 12/11/12 0957  AST 31 26  ALT 19 13  ALKPHOS 118* 102  BILITOT 0.3 0.3  PROT 6.8 6.5  ALBUMIN 3.1* 3.0*    Recent Labs Lab 12/06/12 1044 12/11/12 0957  LIPASE 9* 6*   CBC:  Recent Labs Lab 12/06/12 1044 12/07/12 0525 12/08/12 0600 12/11/12 0957  WBC 7.9 8.8 5.0 15.7*  NEUTROABS 5.8  --   --  13.1*  HGB 11.8* 11.0* 10.6* 12.2  HCT 36.3 33.7* 32.1* 37.3  MCV 89.0 88.2 88.9 89.4  PLT 213 225 193 254   Cardiac Enzymes:  Recent Labs Lab 12/11/12 0957  TROPONINI <0.30    BNP (last 3 results)  Recent Labs  08/28/12 1300 08/30/12 0500 09/07/12 0500  PROBNP 5330.0* 3633.0* 526.0*    Radiological Exams on Admission: Ct Abdomen Pelvis Wo Contrast  12/11/2012   IMPRESSION:  1.  Again noted mild diverticulitis in the distal sigmoid colon. No pericolonic abscess or perforation.  There is  new mild thickening of the colonic wall distal transverse colon and left colon consistent with long segment colitis.  2.  No hydronephrosis or hydroureter.  Bilateral renal  cysts are again noted. 3.  No pericecal inflammation.  Normal appendix is clearly visualized. 4.  Limited assessment of the pelvis due to bilateral metallic artifacts from hip prosthesis.   Original Report Authenticated By: Lahoma Crocker, M.D.    Dg Chest 2 View  12/11/2012   IMPRESSION: No active disease.  No significant change.  Status post CABG.   Original Report Authenticated By: Lahoma Crocker, M.D.     EKG: Independently reviewed. Essentially normal sinus  Assessment/Plan Principal Problem: Patient's PCR positive for C. difficile. Have started CT protocol with by mouth vancomycin plus enteric precautions. They're still clearly component diverticulitis as well so we'll continue Cipro and Flagyl for now at least.    Sigmoid diverticulitis: Failed outpatient therapy. N.p.o. plus symptomatic control of pain and nausea plus IV Cipro and Flagyl Active Problems:   Hypertension: Stable   Hyperlipidemia, statin intol: Stable :    CAD, CABG X 3 10/09. Myoview low risk 10/10. EF >55% 2D 6/13: : Stable    Chronic renal insufficiency, stage III (moderate), baseline around 1.4. Creatinine at 1.27 today    Pacemaker March 2010 (MDT)    Code Status: Patient and daughter confirm DO NOT RESUSCITATE.  Family Communication: Plan discussed with patient and her daughter at the bedside.  Disposition Plan: Home, likely in a few days  Time spent: 25 minutes  [facility] Pager [phone]  If 7PM-7AM, please contact night-coverage [email or web address] Password [facility] 12/11/2012, 5:08 PM

## 2012-12-12 DIAGNOSIS — K5732 Diverticulitis of large intestine without perforation or abscess without bleeding: Secondary | ICD-10-CM

## 2012-12-12 DIAGNOSIS — A0472 Enterocolitis due to Clostridium difficile, not specified as recurrent: Secondary | ICD-10-CM

## 2012-12-12 DIAGNOSIS — R109 Unspecified abdominal pain: Secondary | ICD-10-CM

## 2012-12-12 LAB — CBC
HCT: 32.2 % — ABNORMAL LOW (ref 36.0–46.0)
Hemoglobin: 10.4 g/dL — ABNORMAL LOW (ref 12.0–15.0)
MCH: 28.7 pg (ref 26.0–34.0)
MCHC: 32.3 g/dL (ref 30.0–36.0)

## 2012-12-12 LAB — URINE CULTURE

## 2012-12-12 NOTE — Progress Notes (Signed)
[facility] PROGRESS NOTE  Amy [name] [id number] DOB: 01/27/1922 DOA: 12/11/2012 PCP: Purvis Kilts, MD  Assessment/Plan: Principal Problem:   C. difficile colitis: We'll continue by mouth vancomycin. Slowly advance diet. No antidiarrheal medications. Continue enteric precautions. Had discontinued PPI. Active Problems:   Hypertension: Stable   Hyperlipidemia, statin intol.: Stable   CAD: Stable   Chronic renal insufficiency, stage III (moderate), SCr 1.4 Sept 2013   Pacemaker March 2010 (MDT)   Sigmoid diverticulitis: Given positive C. difficile culture plus final report of CT scan, suspect that the antibiotics for previous diverticulitis plus UTI contributed to C. difficile colitis. I do not suspect that she has acute diverticulitis at this time and so we'll therefore discontinue her IV Cipro and Flagyl   Code Status: DO NOT RESUSCITATE Family Communication: Plan discussed with patient and her daughter at the bedside  Disposition Plan: Home, likely a few days   Consultants:  None  Procedures:  None  Antibiotics:  IV Cipro and Flagyl-day 2  By mouth vancomycin day 2  HPI/Subjective: Patient feeling better and hungry. Try clear liquids, did have some loose bowel movements after, no pain. Feeling better overall.  Objective: Filed Vitals:   12/11/12 1541 12/11/12 1558 12/11/12 2149 12/12/12 0558  BP: 136/52 136/52 124/86 123/74  Pulse: 78 78 69 75  Temp: 98 F (36.7 C) 98 F (36.7 C) 98.2 F (36.8 C) 97.9 F (36.6 C)  TempSrc:  Oral    Resp: 18 20 20 20   Height: 5\' 5"  (1.651 m) 5\' 5"  (1.651 m)    Weight: 68.5 kg (151 lb 0.2 oz) 68.5 kg (151 lb 0.2 oz)  70.534 kg (155 lb 8 oz)  SpO2: 95% 95% 92% 94%    Intake/Output Summary (Last 24 hours) at 12/12/12 1324 Last data filed at 12/12/12 1243  Gross per 24 hour  Intake 2098.75 ml  Output    654 ml  Net 1444.75 ml   Filed Weights   12/11/12 1541 12/11/12 1558 12/12/12 0558  Weight: 68.5 kg  (151 lb 0.2 oz) 68.5 kg (151 lb 0.2 oz) 70.534 kg (155 lb 8 oz)    Exam:   General:  Alert and oriented x3, no acute distress, fatigued  Cardiovascular: Regular rate and rhythm, S1-S2  Respiratory: Clear to auscultation bilaterally  Abdomen: Soft, some mild generalized tenderness on the left side, normoactive bowel sounds, nondistended  Extremities: No clubbing or cyanosis or edema  Data Reviewed: Basic Metabolic Panel:  Recent Labs Lab 12/06/12 1044 12/07/12 0525 12/08/12 0600 12/11/12 0957  NA 139 136 139 137  K 4.0 3.7 3.6 3.6  CL 98 98 102 97  CO2 32 28 28 30   GLUCOSE 98 96 100* 110*  BUN 19 13 9 13   CREATININE 1.14* 1.10 1.07 1.27*  CALCIUM 10.0 9.4 9.1 9.2   Liver Function Tests:  Recent Labs Lab 12/06/12 1044 12/11/12 0957  AST 31 26  ALT 19 13  ALKPHOS 118* 102  BILITOT 0.3 0.3  PROT 6.8 6.5  ALBUMIN 3.1* 3.0*    Recent Labs Lab 12/06/12 1044 12/11/12 0957  LIPASE 9* 6*   CBC:  Recent Labs Lab 12/06/12 1044 12/07/12 0525 12/08/12 0600 12/11/12 0957 12/12/12 0452  WBC 7.9 8.8 5.0 15.7* 8.7  NEUTROABS 5.8  --   --  13.1*  --   HGB 11.8* 11.0* 10.6* 12.2 10.4*  HCT 36.3 33.7* 32.1* 37.3 32.2*  MCV 89.0 88.2 88.9 89.4 89.0  PLT 213 225 193 254 222  Cardiac Enzymes:  Recent Labs Lab 12/11/12 0957  TROPONINI <0.30   BNP (last 3 results)  Recent Labs  08/28/12 1300 08/30/12 0500 09/07/12 0500  PROBNP 5330.0* 3633.0* 526.0*     Recent Results (from the past 240 hour(s))  URINE CULTURE     Status: None   Collection Time    12/03/12 11:34 PM      Result Value Range Status   Specimen Description URINE, CLEAN CATCH   Final   Special Requests NONE   Final   Culture  Setup Time 12/04/2012 00:00   Final   Colony Count 30,000 COLONIES/ML   Final   Culture     Final   Value: VANCOMYCIN RESISTANT ENTEROCOCCUS ISOLATED     7 Note: CRITICAL RESULT CALLED TO, READ BACK BY AND VERIFIED WITH: TAMIKA W. ON 2 14 @ 1430 BY [employer]    Report Status 12/07/2012 FINAL   Final   Organism ID, Bacteria VANCOMYCIN RESISTANT ENTEROCOCCUS ISOLATED   Final  URINE CULTURE     Status: None   Collection Time    12/06/12 11:24 AM      Result Value Range Status   Specimen Description URINE, CLEAN CATCH   Final   Special Requests NONE   Final   Culture  Setup Time 12/06/2012 12:49   Final   Colony Count NO GROWTH   Final   Culture NO GROWTH   Final   Report Status 12/07/2012 FINAL   Final  CLOSTRIDIUM DIFFICILE BY PCR     Status: None   Collection Time    12/07/12  5:30 AM      Result Value Range Status   C difficile by pcr NEGATIVE  NEGATIVE Final  CLOSTRIDIUM DIFFICILE BY PCR     Status: Abnormal   Collection Time    12/11/12  3:17 PM      Result Value Range Status   C difficile by pcr POSITIVE (*) NEGATIVE Final   Comment: CRITICAL RESULT CALLED TO, READ BACK BY AND VERIFIED WITH:     DICKERSON,M. AT [id number] ON 12/11/2012 BY BAUGHAM,M.  STOOL CULTURE     Status: None   Collection Time    12/11/12  3:17 PM      Result Value Range Status   Specimen Description STOOL   Final   Special Requests NONE   Final   Culture Culture reincubated for better growth   Final   Report Status PENDING   Incomplete     Studies: Ct Abdomen Pelvis Wo Contrast  12/11/2012    IMPRESSION:  1.  Again noted mild diverticulitis in the distal sigmoid colon. No pericolonic abscess or perforation.  There is  new mild thickening of the colonic wall distal transverse colon and left colon consistent with long segment colitis.  2.  No hydronephrosis or hydroureter.  Bilateral renal cysts are again noted. 3.  No pericecal inflammation.  Normal appendix is clearly visualized. 4.  Limited assessment of the pelvis due to bilateral metallic artifacts from hip prosthesis.   Original Report Authenticated By: Lahoma Crocker, M.D.    Dg Chest 2 View  12/11/2012    IMPRESSION: No active disease.  No significant change.  Status post CABG.   Original Report Authenticated By:  Lahoma Crocker, M.D.     Scheduled Meds: . sodium chloride   Intravenous STAT  . ciprofloxacin  200 mg Intravenous Q12H  . enoxaparin (LOVENOX) injection  30 mg Subcutaneous Q24H  . metronidazole  500 mg Intravenous  Q8H  . vancomycin  125 mg Oral QID   Continuous Infusions: . sodium chloride 75 mL/hr at 12/11/12 0954  . dextrose 5 % and 0.9% NaCl 75 mL/hr at 12/12/12 1110    Principal Problem:   C. difficile colitis Active Problems:   Hypertension   Hyperlipidemia, statin intol.   CAD, CABG X 3 10/09. Myoview low risk 10/10. EF >55% 2D 6/13.   Chronic renal insufficiency, stage III (moderate), SCr 1.4 Sept 2013   Pacemaker March 2010 (MDT)   Sigmoid diverticulitis    Time spent: 25 minutes    [facility] Pager [phone]. If 8PM-8AM, please contact night-coverage at [email or web address], password [facility] 12/12/2012, 1:24 PM  LOS: 1 day

## 2012-12-12 NOTE — Care Management Note (Unsigned)
    Page 1 of 1   12/12/2012     12:23:23 PM   CARE MANAGEMENT NOTE 12/12/2012  Patient:  Amy [name], Amy [name]   Account Number:  [id number]  Date Initiated:  12/12/2012  Documentation initiated by:  Claretha Cooper  Subjective/Objective Assessment:   Pt readmitted with failed outpt treatment of diverticulitis. DC'd with [facility] RN in place but daughter states they were never called or seen by [facility]. CM notified [place].     Action/Plan:   When DC'd, return to [facility]   Anticipated DC Date:  12/14/2012   Anticipated DC Plan:  [place]  CM consult      Choice offered to / List presented to:             Status of service:  Completed, signed off Medicare Important Message given?   (If response is "NO", the following Medicare IM given date fields will be blank) Date Medicare IM given:   Date Additional Medicare IM given:    Discharge Disposition:    Per UR Regulation:    If discussed at Long Length of Stay Meetings, dates discussed:    Comments:  12/12/12 Claretha Cooper RN BSN CM

## 2012-12-13 DIAGNOSIS — K5732 Diverticulitis of large intestine without perforation or abscess without bleeding: Secondary | ICD-10-CM | POA: Diagnosis not present

## 2012-12-13 DIAGNOSIS — R109 Unspecified abdominal pain: Secondary | ICD-10-CM | POA: Diagnosis not present

## 2012-12-13 DIAGNOSIS — A0472 Enterocolitis due to Clostridium difficile, not specified as recurrent: Secondary | ICD-10-CM | POA: Diagnosis not present

## 2012-12-13 DIAGNOSIS — R197 Diarrhea, unspecified: Secondary | ICD-10-CM | POA: Diagnosis not present

## 2012-12-13 DIAGNOSIS — R52 Pain, unspecified: Secondary | ICD-10-CM

## 2012-12-13 LAB — CBC
Hemoglobin: 10.2 g/dL — ABNORMAL LOW (ref 12.0–15.0)
MCH: 28.7 pg (ref 26.0–34.0)
MCHC: 32.2 g/dL (ref 30.0–36.0)

## 2012-12-13 LAB — BASIC METABOLIC PANEL
BUN: 8 mg/dL (ref 6–23)
Calcium: 8.6 mg/dL (ref 8.4–10.5)
GFR calc non Af Amer: 53 mL/min — ABNORMAL LOW (ref >90)
Glucose, Bld: 93 mg/dL (ref 70–99)
Potassium: 3.4 mEq/L — ABNORMAL LOW (ref 3.5–5.1)

## 2012-12-13 MED ORDER — ENOXAPARIN SODIUM 40 MG/0.4ML ~~LOC~~ SOLN
40.0000 mg | SUBCUTANEOUS | Status: DC
  Administered 2012-12-13: 40 mg via SUBCUTANEOUS
  Filled 2012-12-13: qty 0.4

## 2012-12-13 MED FILL — Oxycodone w/ Acetaminophen Tab 5-325 MG: ORAL | Qty: 6 | Status: AC

## 2012-12-13 NOTE — Progress Notes (Signed)
[facility] PROGRESS NOTE  Amy [name] [id number] DOB: [birth year] DOA: 12/11/2012 PCP: Purvis Kilts, MD  Assessment/Plan: Principal Problem:   C. difficile colitis: We'll continue by mouth vancomycin. Attempted to advance diet to full liquids which led to moderate abdominal pain. Minimal diarrhea. We'll plan to come back to clear liquids and try full liquids tomorrow Active Problems:   Hypertension: Stable   Hyperlipidemia, statin intol.: Stable   CAD: Stable   Chronic renal insufficiency, stage III (moderate), SCr 1.4 Sept 2013, creatinine U normal   Pacemaker March 2010 (MDT)   Sigmoid diverticulitis: Given positive C. difficile culture plus final report of CT scan, suspect that the antibiotics for previous diverticulitis plus UTI contributed to C. difficile colitis. I do not suspect that she has acute diverticulitis at this time and so we'll therefore discontinue her IV Cipro and Flagyl   Code Status: DO NOT RESUSCITATE Family Communication: Plan discussed with patient and her daughter at the bedside  Disposition Plan: Home, likely a few days   Consultants:  None  Procedures:  None  Antibiotics:  IV Cipro and Flagyl-day 2-stopped on 2/12  By mouth vancomycin day 3   HPI/Subjective: Feeling okay. Some moderate amount of abdominal pain when on full liquids. An easier time with clear liquids. No nausea. Few loose stools, but diarrhea overall better.  Objective: Filed Vitals:   12/12/12 1456 12/12/12 2120 12/13/12 0256 12/13/12 0547  BP: 124/74 160/66 130/92 143/72  Pulse: 69 93 120 68  Temp: 97.8 F (36.6 C) 98 F (36.7 C) 99.2 F (37.3 C) 98 F (36.7 C)  TempSrc: Oral Oral Oral   Resp: 18 24  20   Height:      Weight:    69.673 kg (153 lb 9.6 oz)  SpO2: 96% 96% 94% 93%    Intake/Output Summary (Last 24 hours) at 12/13/12 1038 Last data filed at 12/13/12 0827  Gross per 24 hour  Intake    600 ml  Output      0 ml  Net    600 ml   Filed  Weights   12/11/12 1558 12/12/12 0558 12/13/12 0547  Weight: 68.5 kg (151 lb 0.2 oz) 70.534 kg (155 lb 8 oz) 69.673 kg (153 lb 9.6 oz)    Exam:   General:  Alert and oriented x3, no acute distress, fatigued  Cardiovascular: Regular rate and rhythm, S1-S2  Respiratory: Clear to auscultation bilaterally  Abdomen: Soft, some mild generalized tenderness on the left side, normoactive bowel sounds, nondistended  Extremities: No clubbing or cyanosis or edema  Data Reviewed: Basic Metabolic Panel:  Recent Labs Lab 12/06/12 1044 12/07/12 0525 12/08/12 0600 12/11/12 0957 12/13/12 0454  NA 139 136 139 137 143  K 4.0 3.7 3.6 3.6 3.4*  CL 98 98 102 97 108  CO2 32 28 28 30 27   GLUCOSE 98 96 100* 110* 93  BUN 19 13 9 13 8   CREATININE 1.14* 1.10 1.07 1.27* 0.93  CALCIUM 10.0 9.4 9.1 9.2 8.6   Liver Function Tests:  Recent Labs Lab 12/06/12 1044 12/11/12 0957  AST 31 26  ALT 19 13  ALKPHOS 118* 102  BILITOT 0.3 0.3  PROT 6.8 6.5  ALBUMIN 3.1* 3.0*    Recent Labs Lab 12/06/12 1044 12/11/12 0957  LIPASE 9* 6*   CBC:  Recent Labs Lab 12/06/12 1044 12/07/12 0525 12/08/12 0600 12/11/12 0957 12/12/12 0452 12/13/12 0454  WBC 7.9 8.8 5.0 15.7* 8.7 5.3  NEUTROABS 5.8  --   --  13.1*  --   --   HGB 11.8* 11.0* 10.6* 12.2 10.4* 10.2*  HCT 36.3 33.7* 32.1* 37.3 32.2* 31.7*  MCV 89.0 88.2 88.9 89.4 89.0 89.3  PLT 213 225 193 254 222 218   Cardiac Enzymes:  Recent Labs Lab 12/11/12 0957  TROPONINI <0.30   BNP (last 3 results)  Recent Labs  08/28/12 1300 08/30/12 0500 09/07/12 0500  PROBNP 5330.0* 3633.0* 526.0*       Studies: Ct Abdomen Pelvis Wo Contrast  12/11/2012    IMPRESSION:  1.  Again noted mild diverticulitis in the distal sigmoid colon. No pericolonic abscess or perforation.  There is  new mild thickening of the colonic wall distal transverse colon and left colon consistent with long segment colitis.  2.  No hydronephrosis or hydroureter.   Bilateral renal cysts are again noted. 3.  No pericecal inflammation.  Normal appendix is clearly visualized. 4.  Limited assessment of the pelvis due to bilateral metallic artifacts from hip prosthesis.   Original Report Authenticated By: Lahoma Crocker, M.D.    Dg Chest 2 View  12/11/2012    IMPRESSION: No active disease.  No significant change.  Status post CABG.   Original Report Authenticated By: Lahoma Crocker, M.D.     Scheduled Meds: . enoxaparin (LOVENOX) injection  40 mg Subcutaneous Q24H  . vancomycin  125 mg Oral QID   Continuous Infusions: . sodium chloride 20 mL/hr at 12/12/12 1328  . dextrose 5 % and 0.9% NaCl 75 mL/hr at 12/12/12 1110    Principal Problem:   C. difficile colitis Active Problems:   Hypertension   Hyperlipidemia, statin intol.   CAD, CABG X 3 10/09. Myoview low risk 10/10. EF >55% 2D 6/13.   Chronic renal insufficiency, stage III (moderate), SCr 1.4 Sept 2013   Pacemaker March 2010 (MDT)   Sigmoid diverticulitis    Time spent: 20 minutes    [facility] Pager [phone]. If 8PM-8AM, please contact night-coverage at [email or web address], password [facility] 12/13/2012, 10:38 AM  LOS: 2 days

## 2012-12-14 DIAGNOSIS — I2510 Atherosclerotic heart disease of native coronary artery without angina pectoris: Secondary | ICD-10-CM

## 2012-12-14 DIAGNOSIS — A0472 Enterocolitis due to Clostridium difficile, not specified as recurrent: Secondary | ICD-10-CM | POA: Diagnosis not present

## 2012-12-14 DIAGNOSIS — R109 Unspecified abdominal pain: Secondary | ICD-10-CM | POA: Diagnosis not present

## 2012-12-14 DIAGNOSIS — K5732 Diverticulitis of large intestine without perforation or abscess without bleeding: Secondary | ICD-10-CM | POA: Diagnosis not present

## 2012-12-14 MED ORDER — VANCOMYCIN 50 MG/ML ORAL SOLUTION: 125.0000 mg | Freq: Four times a day (QID) | ORAL | Status: AC

## 2012-12-14 NOTE — Discharge Summary (Signed)
Physician Discharge Summary  Amy [name] [id number] DOB: 1922-01-17 DOA: 12/11/2012  PCP: Purvis Kilts, MD  Admit date: 12/11/2012 Discharge date: 12/14/2012  Time spent: 25 minutes  Recommendations for Outpatient Follow-up:  1. Patient is being discharged home with 11 more days of by mouth vancomycin to complete a total of 14 days of therapy 2. She'll followup with her primary care physician in the next one month.  Discharge Diagnoses:  Principal Problem:   C. difficile colitis Active Problems:   Hypertension   Hyperlipidemia, statin intol.   CAD, CABG X 3 10/09. Myoview low risk 10/10. EF >55% 2D 6/13.   Chronic renal insufficiency, stage III (moderate), SCr 1.4 Sept 2013   Pacemaker March 2010 (MDT)   Sigmoid diverticulitis   Discharge Condition: Improved, being discharged home  Diet recommendation: Low sodium heart healthy  Filed Weights   12/11/12 1558 12/12/12 0558 12/13/12 0547  Weight: 68.5 kg (151 lb 0.2 oz) 70.534 kg (155 lb 8 oz) 69.673 kg (153 lb 9.6 oz)    History of present illness:  79 y.o. female  With past medical history of CAD and had been recently discharged from the hospitalist service for diverticulitis. She has discharged on by mouth antibiotics but continued to have episodes of left lower quadrant pain and started having recurrence of nausea and vomiting plus some episodes of diarrhea. Her symptoms persisting, she came back to the emergency room. She is noted to have a white count of 15 as well as a CT scan which confirmed evidence of recurrence of sigmoid diverticulitis. C. difficile PCR was sent. Hospitalists were called for further evaluation and admission. Soon after patient arrived to the floor, her C. difficile PCR came back positive. She was started on by mouth vancomycin and C. difficile precautions.   Hospital Course:  Principal Problem:  C. difficile colitis: Patient's white count normalized by hospital day 2. She still to continue to  have some mild episodes of diarrhea and had a difficult time tolerating by mouth, but by hospital day 4, she was tolerating full liquids and her diet was advanced to solid foods. She had some mild loose stooling and gas, but no bowel pain. Plan will be for her to be discharged home on 11 more days of by mouth vancomycin to complete a total of 14 days of therapy  Active Problems:  Hypertension: Stable, , she'll continue her on metoprolol Hyperlipidemia, statin intol.: Stable  CAD: Stable  Chronic renal insufficiency, stage III (moderate), SCr 1.4 Sept 2013, following hydration, her creatinine on 2/13 was 0.93   Sigmoid diverticulitis: Given positive C. difficile culture plus final report of CT scan, suspect that the antibiotics for previous diverticulitis plus UTI contributed to C. difficile colitis. It was not felt she had acute or recurrent diverticulitis. Cipro and Flagyl were discontinued altogether on hospital day 2.  Procedures:  None  Consultations:  None  Discharge Exam: Filed Vitals:   12/13/12 0547 12/13/12 1521 12/13/12 2156 12/14/12 0503  BP: 143/72 146/74 163/64 154/63  Pulse: 68 68 80 76  Temp: 98 F (36.7 C) 98.1 F (36.7 C) 98.1 F (36.7 C) 98.3 F (36.8 C)  TempSrc:  Oral Oral Oral  Resp: 20 18 18 18   Height:      Weight: 69.673 kg (153 lb 9.6 oz)     SpO2: 93% 96% 96% 94%    General: Alert and oriented x3, no acute distress Cardiovascular: Regular rate and rhythm, S1-S2 Respiratory: Clear to auscultation bilaterally Abdomen:  Soft, nontender, nondistended, hyperactive bowel sounds Extremities: No clubbing or cyanosis or edema  Discharge Instructions  Discharge Orders   Future Orders Complete By Expires     Diet - low sodium heart healthy  As directed     Increase activity slowly  As directed     Walk with assistance  As directed         Medication List    STOP taking these medications       ciprofloxacin 500 MG tablet  Commonly known as:  CIPRO      metroNIDAZOLE 500 MG tablet  Commonly known as:  FLAGYL     omeprazole 20 MG capsule  Commonly known as:  PRILOSEC      TAKE these medications       aspirin 81 MG tablet  Take 81 mg by mouth daily.     furosemide 20 MG tablet  Commonly known as:  LASIX  Take 20 mg by mouth daily as needed. For fluid retention. To take based on weight gain. Weight to be checked every morning     LORazepam 1 MG tablet  Commonly known as:  ATIVAN  Take 0.5-1 mg by mouth at bedtime as needed. Sleep     metoprolol tartrate 25 MG tablet  Commonly known as:  LOPRESSOR  Take 25 mg by mouth daily.     oxyCODONE-acetaminophen 5-325 MG per tablet  Commonly known as:  PERCOCET/ROXICET  Take 1 tablet by mouth every 6 (six) hours as needed for pain.     potassium chloride SA 20 MEQ tablet  Commonly known as:  K-DUR,KLOR-CON  Take 20 mEq by mouth 2 (two) times daily. *To be taken only if Lasix (Furosemide) is taken for fluid retention     PREPARATION H HYDROCORTISONE 1 %  Generic drug:  hydrocortisone cream  Apply 1 application topically 3 (three) times daily as needed.     sucralfate 1 G tablet  Commonly known as:  CARAFATE  Take 0.5 g by mouth 3 (three) times daily.     SYNTHROID 25 MCG tablet  Generic drug:  levothyroxine  Take 25 mcg by mouth daily.     vancomycin 50 mg/mL oral solution  Commonly known as:  VANCOCIN  Take 2.5 mLs (125 mg total) by mouth 4 (four) times daily.           Follow-up Information   Follow up with Purvis Kilts, MD. Schedule an appointment as soon as possible for a visit in 1 month.   Contact information:   [id number] [place] [address] [phone]        The results of significant diagnostics from this hospitalization (including imaging, microbiology, ancillary and laboratory) are listed below for reference.    Significant Diagnostic Studies: Ct Abdomen Pelvis Wo Contrast  12/11/2012   IMPRESSION:  1.   Again noted mild diverticulitis in the distal sigmoid colon. No pericolonic abscess or perforation.  There is  new mild thickening of the colonic wall distal transverse colon and left colon consistent with long segment colitis.  2.  No hydronephrosis or hydroureter.  Bilateral renal cysts are again noted. 3.  No pericecal inflammation.  Normal appendix is clearly visualized. 4.  Limited assessment of the pelvis due to bilateral metallic artifacts from hip prosthesis.   Original Report Authenticated By: Lahoma Crocker, M.D.    Dg Nasal Bones  12/03/2012   IMPRESSION: Nasal bones appear intact.  No depressed fractures.  Probable  retention cyst in the right maxillary antrum.   Original Report Authenticated By: Lucienne Capers, M.D.    Dg Chest 2 View  12/11/2012  IMPRESSION: No active disease.  No significant change.  Status post CABG.   Original Report Authenticated By: Lahoma Crocker, M.D.    Dg Shoulder Right  12/03/2012  IMPRESSION: Degenerative changes in the right shoulder.  No acute bony abnormalities appreciated.   Original Report Authenticated By: Lucienne Capers, M.D.    Dg Wrist Complete Right  12/03/2012    IMPRESSION: Degenerative changes in the right wrist.  No displaced fractures identified.   Original Report Authenticated By: Lucienne Capers, M.D.    Dg Hip Complete Right  12/03/2012    IMPRESSION: Postoperative changes in both hips.  Hardware components appear well seated.  No acute bony abnormalities are suggested.   Original Report Authenticated By: Lucienne Capers, M.D.    Ct Abdomen Pelvis W Contrast  12/06/2012   IMPRESSION:  Uncomplicated diverticulitis involving the lower sigmoid colon.   Original Report Authenticated By: Marijo Sanes, M.D.     Microbiology: Recent Results (from the past 240 hour(s))  URINE CULTURE     Status: None   Collection Time    12/06/12 11:24 AM      Result Value Range Status   Specimen Description URINE, CLEAN CATCH   Final   Special Requests NONE   Final    Culture  Setup Time 12/06/2012 12:49   Final   Colony Count NO GROWTH   Final   Culture NO GROWTH   Final   Report Status 12/07/2012 FINAL   Final  CLOSTRIDIUM DIFFICILE BY PCR     Status: None   Collection Time    12/07/12  5:30 AM      Result Value Range Status   C difficile by pcr NEGATIVE  NEGATIVE Final  URINE CULTURE     Status: None   Collection Time    12/11/12 10:05 AM      Result Value Range Status   Specimen Description URINE, CLEAN CATCH   Final   Special Requests NONE   Final   Culture  Setup Time 12/11/2012 21:16   Final   Colony Count NO GROWTH   Final   Culture NO GROWTH   Final   Report Status 12/12/2012 FINAL   Final  CLOSTRIDIUM DIFFICILE BY PCR     Status: Abnormal   Collection Time    12/11/12  3:17 PM      Result Value Range Status   C difficile by pcr POSITIVE (*) NEGATIVE Final   Comment: CRITICAL RESULT CALLED TO, READ BACK BY AND VERIFIED WITH:     DICKERSON,M. AT 1735 ON 12/11/2012 BY BAUGHAM,M.  STOOL CULTURE     Status: None   Collection Time    12/11/12  3:17 PM      Result Value Range Status   Specimen Description STOOL   Final   Special Requests NONE   Final   Culture     Final   Value: NO SUSPICIOUS COLONIES, CONTINUING TO HOLD     Note: REDUCED NORMAL FLORA PRESENT   Report Status PENDING   Incomplete     Labs: Basic Metabolic Panel:  Recent Labs Lab 12/08/12 0600 12/11/12 0957 12/13/12 0454  NA 139 137 143  K 3.6 3.6 3.4*  CL 102 97 108  CO2 28 30 27   GLUCOSE 100* 110* 93  BUN 9 13 8   CREATININE 1.07 1.27* 0.93  CALCIUM  9.1 9.2 8.6   Liver Function Tests:  Recent Labs Lab 12/11/12 0957  AST 26  ALT 13  ALKPHOS 102  BILITOT 0.3  PROT 6.5  ALBUMIN 3.0*    Recent Labs Lab 12/11/12 0957  LIPASE 6*   CBC:  Recent Labs Lab 12/08/12 0600 12/11/12 0957 12/12/12 0452 12/13/12 0454  WBC 5.0 15.7* 8.7 5.3  NEUTROABS  --  13.1*  --   --   HGB 10.6* 12.2 10.4* 10.2*  HCT 32.1* 37.3 32.2* 31.7*  MCV 88.9 89.4  89.0 89.3  PLT 193 254 222 218   Cardiac Enzymes:  Recent Labs Lab 12/11/12 0957  TROPONINI <0.30      Signed:  Annita Brod  [facility] 12/14/2012, 1:07 PM

## 2012-12-14 NOTE — Evaluation (Signed)
Physical Therapy Evaluation Patient Details Name: Amy [name] MRN: [id number] DOB: 1922/10/26 Today's Date: 12/14/2012 Time: [id number] PT Time Calculation (min): 46 min  PT Assessment / Plan / Recommendation Clinical Impression  Pt was seen for eval/tx.  She is alert and oriented, very cooperative.  She has been ill over the past few weeks and has developed generalized weakness/deconditioning.  Unforturnately, she has not been ambulated here in the hospital.  She actually did fairly well, able to ambulate 120'usning a walker with SBA.  She was able to go up/down steps with supervision.  Her daughter is currently living with her and providing excellent support.  I would recommend resuming HHPT at d/c.    PT Assessment  All further PT needs can be met in the next venue of care    Follow Up Recommendations  Home health PT    Does the patient have the potential to tolerate intense rehabilitation      Barriers to Discharge        Equipment Recommendations  None recommended by PT    Recommendations for Other Services     Frequency      Precautions / Restrictions Precautions Precautions: Fall Precaution Comments: C-diff precautions Restrictions Weight Bearing Restrictions: No   Pertinent Vitals/Pain       Mobility  Bed Mobility Bed Mobility: Not assessed Transfers Transfers: Sit to Stand;Stand to Sit Sit to Stand: 5: Supervision;With upper extremity assist Stand to Sit: 5: Supervision;With upper extremity assist Ambulation/Gait Ambulation/Gait Assistance: 5: Supervision Ambulation Distance (Feet): 120 Feet Assistive device: Rolling walker Gait Pattern: Within Functional Limits Stairs: Yes Stairs Assistance: 5: Supervision Stair Management Technique: Two rails Number of Stairs: 3 Wheelchair Mobility Wheelchair Mobility: No    Exercises General Exercises - Lower Extremity Ankle Circles/Pumps: AROM;Both;10 reps;Seated Long Arc Quad: AROM;Both;10 reps;Seated Heel  Slides: AROM;Both;10 reps;Seated Hip ABduction/ADduction: AROM;Both;10 reps;Seated   PT Diagnosis: Difficulty walking;Generalized weakness  PT Problem List: Decreased strength;Decreased activity tolerance;Decreased mobility PT Treatment Interventions:     PT Goals    Visit Information  Last PT Received On: 12/14/12    Subjective Data  Subjective: I haven't really walked in several days Patient Stated Goal: return home   Prior [place] With: Alone Available Help at Discharge: Family;Available 24 hours/day Type of Home: House Home Access: Stairs to enter [employer] of Steps: 5 Entrance Stairs-Rails: Right Home Layout: One level Bathroom Shower/Tub: Multimedia programmer: Standard Bathroom Accessibility: Yes How Accessible: Accessible via walker Home Adaptive Equipment: Bedside commode/3-in-1;Walker - rolling;Shower chair with back Prior Function Level of Independence: Needs assistance Needs Assistance: Bathing;Dressing;Meal Prep;Light Housekeeping Able to Take Stairs?: Yes Driving: No Vocation: 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 Overall Cognitive Status: Appears within functional limits for tasks assessed/performed Arousal/Alertness: Awake/alert Orientation Level: Appears intact for tasks assessed Behavior During Session: [facility] for tasks performed    Extremity/Trunk Assessment Right Upper Extremity Assessment RUE ROM/Strength/Tone: Deficits RUE ROM/Strength/Tone Deficits: pt has pain in Shoulder, elbow and wrist with movement, but no pain with weight bearing of RUE on a walker...this is resulting from a fall last week, but no fx found Left Upper Extremity Assessment LUE ROM/Strength/Tone: Within functional levels Right Lower Extremity Assessment RLE ROM/Strength/Tone: WFL for tasks assessed RLE Sensation: WFL - Light Touch RLE Coordination: WFL - gross motor Left Lower Extremity  Assessment LLE ROM/Strength/Tone: WFL for tasks assessed LLE Sensation: WFL - Light Touch LLE Coordination: WFL - gross motor   Balance Balance  Balance Assessed: No  End of Session PT - End of Session Activity Tolerance: Patient tolerated treatment well Patient left: in chair;with call bell/phone within reach;with chair alarm set;with family/visitor present Nurse Communication: Mobility status  GP     Sable Feil 12/14/2012, 12:15 PM

## 2012-12-14 NOTE — Progress Notes (Signed)
12/14/12 1607 Patient left floor in stable condition via w/c accompanied by her daughter and nurse tech. Discharged home. Donavan Foil, RN

## 2012-12-14 NOTE — Progress Notes (Signed)
12/14/12 1223 Patient assisted up to chair with assist this morning. Tolerated well, general weakness. Chair alarm on for safety. Daughter at bedside. Instructed to call for assist. Pt states comfortable sitting up at this time. Donavan Foil, RN

## 2012-12-14 NOTE — Progress Notes (Signed)
12/14/12 1544 Patient being discharged home today. Reviewed discharge instructions with patient, daughter at bedside. Given copy of instructions, medication list, f/u appointment information, prescription called in to [facility] per MD. Verbalized understanding of instructions, noted which medications taken today on discharge med list. Discussed c-diff precautions with patient and daughter and given c-diff education sheet. Home health set up per case management with [facility], provided patient and family with contact information. IV site d/c'd and within normal limits. Pt in stable condition awaiting ride home for discharge. Donavan Foil, RN

## 2012-12-15 DIAGNOSIS — K219 Gastro-esophageal reflux disease without esophagitis: Secondary | ICD-10-CM | POA: Diagnosis not present

## 2012-12-15 DIAGNOSIS — Z950 Presence of cardiac pacemaker: Secondary | ICD-10-CM | POA: Diagnosis not present

## 2012-12-15 DIAGNOSIS — I2510 Atherosclerotic heart disease of native coronary artery without angina pectoris: Secondary | ICD-10-CM | POA: Diagnosis not present

## 2012-12-15 DIAGNOSIS — M159 Polyosteoarthritis, unspecified: Secondary | ICD-10-CM | POA: Diagnosis not present

## 2012-12-15 DIAGNOSIS — I10 Essential (primary) hypertension: Secondary | ICD-10-CM | POA: Diagnosis not present

## 2012-12-15 DIAGNOSIS — Z471 Aftercare following joint replacement surgery: Secondary | ICD-10-CM | POA: Diagnosis not present

## 2012-12-15 LAB — STOOL CULTURE

## 2012-12-17 DIAGNOSIS — K219 Gastro-esophageal reflux disease without esophagitis: Secondary | ICD-10-CM | POA: Diagnosis not present

## 2012-12-17 DIAGNOSIS — Z950 Presence of cardiac pacemaker: Secondary | ICD-10-CM | POA: Diagnosis not present

## 2012-12-17 DIAGNOSIS — Z471 Aftercare following joint replacement surgery: Secondary | ICD-10-CM | POA: Diagnosis not present

## 2012-12-17 DIAGNOSIS — I472 Ventricular tachycardia: Secondary | ICD-10-CM | POA: Diagnosis not present

## 2012-12-17 DIAGNOSIS — Z45018 Encounter for adjustment and management of other part of cardiac pacemaker: Secondary | ICD-10-CM | POA: Diagnosis not present

## 2012-12-17 DIAGNOSIS — M159 Polyosteoarthritis, unspecified: Secondary | ICD-10-CM | POA: Diagnosis not present

## 2012-12-17 DIAGNOSIS — R5381 Other malaise: Secondary | ICD-10-CM | POA: Diagnosis not present

## 2012-12-17 DIAGNOSIS — I2510 Atherosclerotic heart disease of native coronary artery without angina pectoris: Secondary | ICD-10-CM | POA: Diagnosis not present

## 2012-12-17 DIAGNOSIS — I10 Essential (primary) hypertension: Secondary | ICD-10-CM | POA: Diagnosis not present

## 2012-12-17 DIAGNOSIS — Z79899 Other long term (current) drug therapy: Secondary | ICD-10-CM | POA: Diagnosis not present

## 2012-12-18 DIAGNOSIS — I2510 Atherosclerotic heart disease of native coronary artery without angina pectoris: Secondary | ICD-10-CM | POA: Diagnosis not present

## 2012-12-18 DIAGNOSIS — M159 Polyosteoarthritis, unspecified: Secondary | ICD-10-CM | POA: Diagnosis not present

## 2012-12-18 DIAGNOSIS — Z950 Presence of cardiac pacemaker: Secondary | ICD-10-CM | POA: Diagnosis not present

## 2012-12-18 DIAGNOSIS — K219 Gastro-esophageal reflux disease without esophagitis: Secondary | ICD-10-CM | POA: Diagnosis not present

## 2012-12-18 DIAGNOSIS — I10 Essential (primary) hypertension: Secondary | ICD-10-CM | POA: Diagnosis not present

## 2012-12-18 DIAGNOSIS — Z471 Aftercare following joint replacement surgery: Secondary | ICD-10-CM | POA: Diagnosis not present

## 2012-12-19 DIAGNOSIS — Z950 Presence of cardiac pacemaker: Secondary | ICD-10-CM | POA: Diagnosis not present

## 2012-12-19 DIAGNOSIS — Z471 Aftercare following joint replacement surgery: Secondary | ICD-10-CM | POA: Diagnosis not present

## 2012-12-19 DIAGNOSIS — K219 Gastro-esophageal reflux disease without esophagitis: Secondary | ICD-10-CM | POA: Diagnosis not present

## 2012-12-19 DIAGNOSIS — I10 Essential (primary) hypertension: Secondary | ICD-10-CM | POA: Diagnosis not present

## 2012-12-19 DIAGNOSIS — M159 Polyosteoarthritis, unspecified: Secondary | ICD-10-CM | POA: Diagnosis not present

## 2012-12-19 DIAGNOSIS — I2510 Atherosclerotic heart disease of native coronary artery without angina pectoris: Secondary | ICD-10-CM | POA: Diagnosis not present

## 2012-12-21 DIAGNOSIS — Z471 Aftercare following joint replacement surgery: Secondary | ICD-10-CM | POA: Diagnosis not present

## 2012-12-21 DIAGNOSIS — I2510 Atherosclerotic heart disease of native coronary artery without angina pectoris: Secondary | ICD-10-CM | POA: Diagnosis not present

## 2012-12-21 DIAGNOSIS — K219 Gastro-esophageal reflux disease without esophagitis: Secondary | ICD-10-CM | POA: Diagnosis not present

## 2012-12-21 DIAGNOSIS — M159 Polyosteoarthritis, unspecified: Secondary | ICD-10-CM | POA: Diagnosis not present

## 2012-12-21 DIAGNOSIS — I10 Essential (primary) hypertension: Secondary | ICD-10-CM | POA: Diagnosis not present

## 2012-12-21 DIAGNOSIS — Z950 Presence of cardiac pacemaker: Secondary | ICD-10-CM | POA: Diagnosis not present

## 2012-12-24 DIAGNOSIS — I2510 Atherosclerotic heart disease of native coronary artery without angina pectoris: Secondary | ICD-10-CM | POA: Diagnosis not present

## 2012-12-24 DIAGNOSIS — K219 Gastro-esophageal reflux disease without esophagitis: Secondary | ICD-10-CM | POA: Diagnosis not present

## 2012-12-24 DIAGNOSIS — M159 Polyosteoarthritis, unspecified: Secondary | ICD-10-CM | POA: Diagnosis not present

## 2012-12-24 DIAGNOSIS — Z950 Presence of cardiac pacemaker: Secondary | ICD-10-CM | POA: Diagnosis not present

## 2012-12-24 DIAGNOSIS — I10 Essential (primary) hypertension: Secondary | ICD-10-CM | POA: Diagnosis not present

## 2012-12-24 DIAGNOSIS — Z471 Aftercare following joint replacement surgery: Secondary | ICD-10-CM | POA: Diagnosis not present

## 2012-12-26 DIAGNOSIS — Z471 Aftercare following joint replacement surgery: Secondary | ICD-10-CM | POA: Diagnosis not present

## 2012-12-26 DIAGNOSIS — K219 Gastro-esophageal reflux disease without esophagitis: Secondary | ICD-10-CM | POA: Diagnosis not present

## 2012-12-26 DIAGNOSIS — Z950 Presence of cardiac pacemaker: Secondary | ICD-10-CM | POA: Diagnosis not present

## 2012-12-26 DIAGNOSIS — I2510 Atherosclerotic heart disease of native coronary artery without angina pectoris: Secondary | ICD-10-CM | POA: Diagnosis not present

## 2012-12-26 DIAGNOSIS — M159 Polyosteoarthritis, unspecified: Secondary | ICD-10-CM | POA: Diagnosis not present

## 2012-12-26 DIAGNOSIS — I10 Essential (primary) hypertension: Secondary | ICD-10-CM | POA: Diagnosis not present

## 2013-01-01 DIAGNOSIS — Z950 Presence of cardiac pacemaker: Secondary | ICD-10-CM | POA: Diagnosis not present

## 2013-01-01 DIAGNOSIS — Z471 Aftercare following joint replacement surgery: Secondary | ICD-10-CM | POA: Diagnosis not present

## 2013-01-01 DIAGNOSIS — I10 Essential (primary) hypertension: Secondary | ICD-10-CM | POA: Diagnosis not present

## 2013-01-01 DIAGNOSIS — I2510 Atherosclerotic heart disease of native coronary artery without angina pectoris: Secondary | ICD-10-CM | POA: Diagnosis not present

## 2013-01-01 DIAGNOSIS — M159 Polyosteoarthritis, unspecified: Secondary | ICD-10-CM | POA: Diagnosis not present

## 2013-01-01 DIAGNOSIS — K219 Gastro-esophageal reflux disease without esophagitis: Secondary | ICD-10-CM | POA: Diagnosis not present

## 2013-01-01 DIAGNOSIS — L570 Actinic keratosis: Secondary | ICD-10-CM | POA: Diagnosis not present

## 2013-01-02 DIAGNOSIS — K219 Gastro-esophageal reflux disease without esophagitis: Secondary | ICD-10-CM | POA: Diagnosis not present

## 2013-01-02 DIAGNOSIS — Z950 Presence of cardiac pacemaker: Secondary | ICD-10-CM | POA: Diagnosis not present

## 2013-01-02 DIAGNOSIS — I2510 Atherosclerotic heart disease of native coronary artery without angina pectoris: Secondary | ICD-10-CM | POA: Diagnosis not present

## 2013-01-02 DIAGNOSIS — Z471 Aftercare following joint replacement surgery: Secondary | ICD-10-CM | POA: Diagnosis not present

## 2013-01-02 DIAGNOSIS — I10 Essential (primary) hypertension: Secondary | ICD-10-CM | POA: Diagnosis not present

## 2013-01-02 DIAGNOSIS — M159 Polyosteoarthritis, unspecified: Secondary | ICD-10-CM | POA: Diagnosis not present

## 2013-01-03 DIAGNOSIS — M159 Polyosteoarthritis, unspecified: Secondary | ICD-10-CM | POA: Diagnosis not present

## 2013-01-03 DIAGNOSIS — Z471 Aftercare following joint replacement surgery: Secondary | ICD-10-CM | POA: Diagnosis not present

## 2013-01-03 DIAGNOSIS — K219 Gastro-esophageal reflux disease without esophagitis: Secondary | ICD-10-CM | POA: Diagnosis not present

## 2013-01-03 DIAGNOSIS — I10 Essential (primary) hypertension: Secondary | ICD-10-CM | POA: Diagnosis not present

## 2013-01-03 DIAGNOSIS — Z950 Presence of cardiac pacemaker: Secondary | ICD-10-CM | POA: Diagnosis not present

## 2013-01-03 DIAGNOSIS — I2510 Atherosclerotic heart disease of native coronary artery without angina pectoris: Secondary | ICD-10-CM | POA: Diagnosis not present

## 2013-01-07 DIAGNOSIS — IMO0002 Reserved for concepts with insufficient information to code with codable children: Secondary | ICD-10-CM | POA: Diagnosis not present

## 2013-01-07 DIAGNOSIS — R3919 Other difficulties with micturition: Secondary | ICD-10-CM | POA: Diagnosis not present

## 2013-01-08 DIAGNOSIS — M159 Polyosteoarthritis, unspecified: Secondary | ICD-10-CM | POA: Diagnosis not present

## 2013-01-08 DIAGNOSIS — Z471 Aftercare following joint replacement surgery: Secondary | ICD-10-CM | POA: Diagnosis not present

## 2013-01-08 DIAGNOSIS — K219 Gastro-esophageal reflux disease without esophagitis: Secondary | ICD-10-CM | POA: Diagnosis not present

## 2013-01-08 DIAGNOSIS — I2510 Atherosclerotic heart disease of native coronary artery without angina pectoris: Secondary | ICD-10-CM | POA: Diagnosis not present

## 2013-01-08 DIAGNOSIS — Z950 Presence of cardiac pacemaker: Secondary | ICD-10-CM | POA: Diagnosis not present

## 2013-01-08 DIAGNOSIS — I10 Essential (primary) hypertension: Secondary | ICD-10-CM | POA: Diagnosis not present

## 2013-01-31 DIAGNOSIS — H40019 Open angle with borderline findings, low risk, unspecified eye: Secondary | ICD-10-CM | POA: Diagnosis not present

## 2013-02-12 DIAGNOSIS — R1310 Dysphagia, unspecified: Secondary | ICD-10-CM | POA: Diagnosis not present

## 2013-02-12 DIAGNOSIS — M7710 Lateral epicondylitis, unspecified elbow: Secondary | ICD-10-CM | POA: Diagnosis not present

## 2013-02-12 DIAGNOSIS — IMO0002 Reserved for concepts with insufficient information to code with codable children: Secondary | ICD-10-CM | POA: Diagnosis not present

## 2013-03-01 DIAGNOSIS — J069 Acute upper respiratory infection, unspecified: Secondary | ICD-10-CM | POA: Diagnosis not present

## 2013-03-01 DIAGNOSIS — Z6825 Body mass index (BMI) 25.0-25.9, adult: Secondary | ICD-10-CM | POA: Diagnosis not present

## 2013-03-01 DIAGNOSIS — J309 Allergic rhinitis, unspecified: Secondary | ICD-10-CM | POA: Diagnosis not present

## 2013-04-12 DIAGNOSIS — Z45018 Encounter for adjustment and management of other part of cardiac pacemaker: Secondary | ICD-10-CM | POA: Diagnosis not present

## 2013-04-12 DIAGNOSIS — Z951 Presence of aortocoronary bypass graft: Secondary | ICD-10-CM | POA: Diagnosis not present

## 2013-04-12 DIAGNOSIS — I10 Essential (primary) hypertension: Secondary | ICD-10-CM | POA: Diagnosis not present

## 2013-04-12 DIAGNOSIS — E782 Mixed hyperlipidemia: Secondary | ICD-10-CM | POA: Diagnosis not present

## 2013-04-12 LAB — PACEMAKER DEVICE OBSERVATION

## 2013-04-16 ENCOUNTER — Other Ambulatory Visit: Payer: Self-pay | Admitting: Cardiovascular Disease

## 2013-04-16 DIAGNOSIS — R6889 Other general symptoms and signs: Secondary | ICD-10-CM | POA: Diagnosis not present

## 2013-04-16 DIAGNOSIS — R5381 Other malaise: Secondary | ICD-10-CM | POA: Diagnosis not present

## 2013-04-16 DIAGNOSIS — E782 Mixed hyperlipidemia: Secondary | ICD-10-CM | POA: Diagnosis not present

## 2013-04-16 LAB — CBC WITH DIFFERENTIAL/PLATELET
Basophils Absolute: 0.1 10*3/uL (ref 0.0–0.1)
Basophils Relative: 1 % (ref 0–1)
Eosinophils Relative: 3 % (ref 0–5)
HCT: 40.2 % (ref 36.0–46.0)
Lymphocytes Relative: 33 % (ref 12–46)
MCHC: 33.1 g/dL (ref 30.0–36.0)
MCV: 85.7 fL (ref 78.0–100.0)
Monocytes Absolute: 0.4 10*3/uL (ref 0.1–1.0)
RDW: 15 % (ref 11.5–15.5)

## 2013-04-16 LAB — COMPREHENSIVE METABOLIC PANEL
Albumin: 3.9 g/dL (ref 3.5–5.2)
Alkaline Phosphatase: 91 U/L (ref 39–117)
BUN: 29 mg/dL — ABNORMAL HIGH (ref 6–23)
CO2: 30 mEq/L (ref 19–32)
Glucose, Bld: 101 mg/dL — ABNORMAL HIGH (ref 70–99)
Potassium: 5.7 mEq/L — ABNORMAL HIGH (ref 3.5–5.3)

## 2013-04-16 LAB — LIPID PANEL
Cholesterol: 401 mg/dL — ABNORMAL HIGH (ref 0–200)
HDL: 52 mg/dL (ref >39)
LDL Cholesterol: 291 mg/dL — ABNORMAL HIGH (ref 0–99)
Triglycerides: 291 mg/dL — ABNORMAL HIGH (ref <150)

## 2013-04-17 ENCOUNTER — Encounter: Payer: Self-pay | Admitting: Cardiovascular Disease

## 2013-05-16 ENCOUNTER — Other Ambulatory Visit: Payer: Self-pay | Admitting: Geriatric Medicine

## 2013-05-20 DIAGNOSIS — Z85828 Personal history of other malignant neoplasm of skin: Secondary | ICD-10-CM | POA: Diagnosis not present

## 2013-05-20 DIAGNOSIS — L570 Actinic keratosis: Secondary | ICD-10-CM | POA: Diagnosis not present

## 2013-05-21 ENCOUNTER — Other Ambulatory Visit: Payer: Self-pay | Admitting: Geriatric Medicine

## 2013-05-21 MED ORDER — LORAZEPAM 1 MG PO TABS: ORAL_TABLET | ORAL | Status: DC

## 2013-06-26 DIAGNOSIS — H00019 Hordeolum externum unspecified eye, unspecified eyelid: Secondary | ICD-10-CM | POA: Diagnosis not present

## 2013-06-26 DIAGNOSIS — Z6826 Body mass index (BMI) 26.0-26.9, adult: Secondary | ICD-10-CM | POA: Diagnosis not present

## 2013-06-26 DIAGNOSIS — N390 Urinary tract infection, site not specified: Secondary | ICD-10-CM | POA: Diagnosis not present

## 2013-07-02 DIAGNOSIS — L988 Other specified disorders of the skin and subcutaneous tissue: Secondary | ICD-10-CM | POA: Diagnosis not present

## 2013-07-02 DIAGNOSIS — C44319 Basal cell carcinoma of skin of other parts of face: Secondary | ICD-10-CM | POA: Diagnosis not present

## 2013-07-12 DIAGNOSIS — N390 Urinary tract infection, site not specified: Secondary | ICD-10-CM | POA: Diagnosis not present

## 2013-07-12 DIAGNOSIS — Z6826 Body mass index (BMI) 26.0-26.9, adult: Secondary | ICD-10-CM | POA: Diagnosis not present

## 2013-07-15 DIAGNOSIS — N390 Urinary tract infection, site not specified: Secondary | ICD-10-CM | POA: Diagnosis not present

## 2013-07-16 DIAGNOSIS — I472 Ventricular tachycardia: Secondary | ICD-10-CM | POA: Diagnosis not present

## 2013-07-16 DIAGNOSIS — I2510 Atherosclerotic heart disease of native coronary artery without angina pectoris: Secondary | ICD-10-CM | POA: Diagnosis not present

## 2013-07-16 DIAGNOSIS — Z45018 Encounter for adjustment and management of other part of cardiac pacemaker: Secondary | ICD-10-CM | POA: Diagnosis not present

## 2013-07-16 DIAGNOSIS — E782 Mixed hyperlipidemia: Secondary | ICD-10-CM | POA: Diagnosis not present

## 2013-07-16 LAB — PACEMAKER DEVICE OBSERVATION

## 2013-07-18 ENCOUNTER — Telehealth (HOSPITAL_COMMUNITY): Payer: Self-pay | Admitting: *Deleted

## 2013-07-24 ENCOUNTER — Ambulatory Visit (HOSPITAL_COMMUNITY)
Admission: RE | Admit: 2013-07-24 | Discharge: 2013-07-24 | Disposition: A | Discharge status: Home / Self Care | Payer: Medicare Other | Source: Ambulatory Visit | Attending: Cardiovascular Disease | Admitting: Cardiovascular Disease

## 2013-07-24 DIAGNOSIS — R011 Cardiac murmur, unspecified: Secondary | ICD-10-CM | POA: Diagnosis not present

## 2013-07-24 DIAGNOSIS — I4891 Unspecified atrial fibrillation: Secondary | ICD-10-CM | POA: Insufficient documentation

## 2013-07-24 DIAGNOSIS — I495 Sick sinus syndrome: Secondary | ICD-10-CM | POA: Diagnosis not present

## 2013-07-24 DIAGNOSIS — I10 Essential (primary) hypertension: Secondary | ICD-10-CM | POA: Insufficient documentation

## 2013-07-24 NOTE — Progress Notes (Signed)
*  PRELIMINARY RESULTS* Echocardiogram 2D Echocardiogram has been performed.  [place], [place] 07/24/2013, 12:51 PM

## 2013-07-30 ENCOUNTER — Telehealth: Payer: Self-pay | Admitting: Cardiovascular Disease

## 2013-07-30 NOTE — Telephone Encounter (Signed)
Message forwarded to [facility]. Tressie Stalker, LPN.  Chart# [id number] on desk.

## 2013-07-30 NOTE — Telephone Encounter (Signed)
Went to [facility] to have an  Echo on 9-24-she would like the results.

## 2013-07-31 NOTE — Telephone Encounter (Signed)
Pt. Given results and stated understanding. Pt. Also to see Dr. Ginnie Smart on next visit

## 2013-08-24 DIAGNOSIS — Z23 Encounter for immunization: Secondary | ICD-10-CM | POA: Diagnosis not present

## 2013-08-28 ENCOUNTER — Other Ambulatory Visit: Payer: Self-pay | Admitting: *Deleted

## 2013-08-28 MED ORDER — LOSARTAN POTASSIUM 100 MG PO TABS: 100.0000 mg | ORAL_TABLET | Freq: Every day | ORAL | Status: DC

## 2013-08-28 NOTE — Telephone Encounter (Signed)
Rx was sent to pharmacy electronically. 

## 2013-09-12 DIAGNOSIS — H40019 Open angle with borderline findings, low risk, unspecified eye: Secondary | ICD-10-CM | POA: Diagnosis not present

## 2013-11-14 ENCOUNTER — Other Ambulatory Visit: Payer: Self-pay | Admitting: *Deleted

## 2013-11-14 MED ORDER — METOPROLOL SUCCINATE ER 25 MG PO TB24
25.0000 mg | ORAL_TABLET | Freq: Every day | ORAL | Status: DC
Start: 2013-11-14 — End: 2013-11-26

## 2013-11-14 NOTE — Telephone Encounter (Signed)
Rx was sent to pharmacy electronically. 

## 2013-11-19 DIAGNOSIS — D0470 Carcinoma in situ of skin of unspecified lower limb, including hip: Secondary | ICD-10-CM | POA: Diagnosis not present

## 2013-11-19 DIAGNOSIS — D485 Neoplasm of uncertain behavior of skin: Secondary | ICD-10-CM | POA: Diagnosis not present

## 2013-11-19 DIAGNOSIS — L570 Actinic keratosis: Secondary | ICD-10-CM | POA: Diagnosis not present

## 2013-11-19 DIAGNOSIS — Z85828 Personal history of other malignant neoplasm of skin: Secondary | ICD-10-CM | POA: Diagnosis not present

## 2013-11-21 ENCOUNTER — Encounter: Payer: Self-pay | Admitting: *Deleted

## 2013-11-26 ENCOUNTER — Encounter: Payer: Self-pay | Admitting: Cardiovascular Disease

## 2013-11-26 ENCOUNTER — Ambulatory Visit (INDEPENDENT_AMBULATORY_CARE_PROVIDER_SITE_OTHER): Payer: Medicare Other | Admitting: Cardiovascular Disease

## 2013-11-26 VITALS — BP 144/74 | HR 77 | Ht 65.0 in | Wt 153.9 lb

## 2013-11-26 DIAGNOSIS — I2510 Atherosclerotic heart disease of native coronary artery without angina pectoris: Secondary | ICD-10-CM | POA: Diagnosis not present

## 2013-11-26 DIAGNOSIS — I4891 Unspecified atrial fibrillation: Secondary | ICD-10-CM

## 2013-11-26 DIAGNOSIS — Z950 Presence of cardiac pacemaker: Secondary | ICD-10-CM | POA: Diagnosis not present

## 2013-11-26 DIAGNOSIS — I10 Essential (primary) hypertension: Secondary | ICD-10-CM

## 2013-11-26 DIAGNOSIS — E785 Hyperlipidemia, unspecified: Secondary | ICD-10-CM

## 2013-11-26 DIAGNOSIS — I480 Paroxysmal atrial fibrillation: Secondary | ICD-10-CM

## 2013-11-26 LAB — PACEMAKER DEVICE OBSERVATION

## 2013-11-26 MED ORDER — METOPROLOL SUCCINATE ER 25 MG PO TB24: 25.0000 mg | ORAL_TABLET | Freq: Every day | ORAL | Status: DC

## 2013-11-26 MED ORDER — LOSARTAN POTASSIUM 100 MG PO TABS: 100.0000 mg | ORAL_TABLET | Freq: Every day | ORAL | Status: DC

## 2013-11-26 MED ORDER — FUROSEMIDE 20 MG PO TABS: 20.0000 mg | ORAL_TABLET | Freq: Every day | ORAL | Status: DC | PRN

## 2013-11-26 NOTE — Patient Instructions (Addendum)
Remote monitoring is used to monitor your pacemaker from home. This monitoring reduces the number of office visits required to check your device to one time per year. It allows [place] to keep an eye on the functioning of your device to ensure it is working properly. You are scheduled for a device check from home on 02-27-2014. You may send your transmission at any time that day. If you have a wireless device, the transmission will be sent automatically. After your physician reviews your transmission, you will receive a postcard with your next transmission date.  Dr. Sallyanne Kuster recommends that you schedule a follow-up appointment in: 6 months.

## 2013-11-28 DIAGNOSIS — L570 Actinic keratosis: Secondary | ICD-10-CM | POA: Diagnosis not present

## 2013-11-28 DIAGNOSIS — C44721 Squamous cell carcinoma of skin of unspecified lower limb, including hip: Secondary | ICD-10-CM | POA: Diagnosis not present

## 2013-11-28 LAB — MDC_IDC_ENUM_SESS_TYPE_INCLINIC
Battery Voltage: 2.99 V
Brady Statistic AP VP Percent: 0.5 %
Lead Channel Impedance Value: 856 Ohm
Lead Channel Pacing Threshold Amplitude: 1 V
Lead Channel Sensing Intrinsic Amplitude: 18.5 mV
Lead Channel Setting Pacing Amplitude: 2 V
Lead Channel Setting Pacing Amplitude: 2.5 V
Lead Channel Setting Pacing Pulse Width: 0.8 ms
Lead Channel Setting Sensing Sensitivity: 0.9 mV
MDC IDC MSMT LEADCHNL RA IMPEDANCE VALUE: 512 Ohm
MDC IDC MSMT LEADCHNL RA PACING THRESHOLD AMPLITUDE: 1 V
MDC IDC MSMT LEADCHNL RA PACING THRESHOLD PULSEWIDTH: 0.4 ms
MDC IDC MSMT LEADCHNL RA SENSING INTR AMPL: 3.5 mV
MDC IDC MSMT LEADCHNL RV PACING THRESHOLD PULSEWIDTH: 0.8 ms
MDC IDC STAT BRADY AP VS PERCENT: 79 %
MDC IDC STAT BRADY AS VP PERCENT: 0.1 %
MDC IDC STAT BRADY AS VS PERCENT: 20.4 %

## 2013-11-28 NOTE — Progress Notes (Signed)
Patient ID: Amy [name], female   DOB: 1922/01/23, 80 y.o.   MRN: [id number]      Reason for office visit Followup CAD, pacemaker  Amy [name] is a 80 year old woman who is a long-term patient of Terance Ice until his recent retirement. In 2009 she had an acute inferior wall ST segment elevation myocardial infarction treated with urgent angioplasty and placement of a bare-metal stent. Subsequently she underwent multivessel bypass surgery with LIMA to LAD, SVG to ramus SVG to RCA. She has no evidence of residual myocardial injury and has preserved left ventricular systolic function.  In 2010 a permanent pacemaker was implanted for symptomatic bradycardia. She has a remote history of paroxysmal atrial fibrillation but since pacemaker implantation has not had any recorded events. She is not taking anticoagulants at this time. She does take aspirin.  In late 2013 she had a fall complicated by a hip fracture and radius fracture. Unfortunately during her treatment she had an esophageal perforation complicated by mediastinitis. She had a protracted illness and had a feeding tube for several months but this has now been removed and she has made a remarkably strong recovery for a 80 year old. She walks without assistance. She lives independently with help from her daughter.  Today she has no complaints.   Allergies  Allergen Reactions  . Hydrocodone-Acetaminophen Itching    Tolerates tylenol    Current Outpatient Prescriptions  Medication Sig Dispense Refill  . aspirin 81 MG tablet Take 81 mg by mouth daily.      . colestipol (COLESTID) 1 G tablet Take 1 g by mouth 2 (two) times daily.      . furosemide (LASIX) 20 MG tablet Take 1 tablet (20 mg total) by mouth daily as needed. For fluid retention. To take based on weight gain. Weight to be checked every morning  90 tablet  3  . hydrocortisone cream (PREPARATION H HYDROCORTISONE) 1 % Apply 1 application topically 3 (three) times daily as  needed.      Marland Kitchen levothyroxine (SYNTHROID) 25 MCG tablet Take 25 mcg by mouth daily.      Marland Kitchen LORazepam (ATIVAN) 1 MG tablet Take one tablet by mouth at bedtime as needed.  30 tablet  5  . losartan (COZAAR) 100 MG tablet Take 1 tablet (100 mg total) by mouth daily.  90 tablet  3  . metoprolol succinate (TOPROL-XL) 25 MG 24 hr tablet Take 1 tablet (25 mg total) by mouth daily.  90 tablet  3  . omeprazole (PRILOSEC) 40 MG capsule Take 40 mg by mouth daily.       No current facility-administered medications for this visit.    Past Medical History  Diagnosis Date  . Hypertension   . Hypercholesteremia   . Myocardial infarction     Inferior STEMI 07/2008 - PCI of prox RCA followed byu CABG x 3 in 9/'09  . Pneumonia   . Anxiety   . CAD in native artery     s/p CABG x 3; Last Myoview in 07/2009 - non-ischemic; Echo 03/2012  Aortic Sclerosis with normal EF.  Marland Kitchen Asthma   . Pacemaker   . GERD (gastroesophageal reflux disease)   . Arthritis   . Status post hip hemiarthroplasty left hip by Dr Ronnie Derby 08/08/2012  . PAF (paroxysmal atrial fibrillation)   . CKD (chronic kidney disease), stage III   . DVT (deep vein thrombosis) in pregnancy   . Esophageal perforation     9/13  . Mediastinitis  s/p drainage  . Hypothyroidism     Past Surgical History  Procedure Laterality Date  . Coronary artery bypass graft  2009    LIMA-LAD, SVG-RI, SVG-stented RCA  . Total hip arthroplasty    . Pacemaker insertion  01/06/2009    Medtronic  . Wrist fracture surgery  07/2012    left intra articular  with carpel tunnel  . Carpal tunnel release  08/08/2012    Procedure: CARPAL TUNNEL RELEASE;  Surgeon: Schuyler Amor, MD;  Location: [place];  Service: Orthopedics;  Laterality: Left;  . Hip arthroplasty  08/08/2012    Procedure: ARTHROPLASTY BIPOLAR HIP;  Surgeon: Rudean Haskell, MD;  Location: [place];  Service: Orthopedics;  Laterality: Left;  [place]   . Esophagogastroduodenoscopy  08/10/2012    Procedure:  ESOPHAGOGASTRODUODENOSCOPY (EGD);  Surgeon: Beryle Beams, MD;  Location: [facility] ENDOSCOPY;  Service: Endoscopy;  Laterality: N/A;  . Esophagoscopy  08/10/2012    Procedure: ESOPHAGOSCOPY;  Surgeon: [name], MD;  Location: [place];  Service: ENT;  Laterality: N/A;  . Gastrostomy  08/16/2012    Procedure: GASTROSTOMY;  Surgeon: Zenovia Jarred, MD;  Location: [place];  Service: General;  Laterality: N/A;  . Joint replacement    . Nm myocar perf wall motion  08/14/2009    Normal    Family History  Problem Relation Age of Onset  . Hypertension Mother   . Diabetes Mother   . Cancer Mother   . Hypertension Father   . Stroke Father     History   Social History  . Marital Status: Widowed    Spouse Name: N/A    Number of Children: N/A  . Years of Education: N/A   Occupational History  . Not on file.   Social History Main Topics  . Smoking status: Never Smoker   . Smokeless tobacco: Never Used  . Alcohol Use: No  . Drug Use: No  . Sexual Activity: No   Other Topics Concern  . Not on file   Social History Narrative  . No narrative on file    Review of systems: The patient specifically denies any chest pain at rest or with exertion, dyspnea at rest or with exertion, orthopnea, paroxysmal nocturnal dyspnea, syncope, palpitations, focal neurological deficits, intermittent claudication, lower extremity edema, unexplained weight gain, cough, hemoptysis or wheezing.  The patient also denies abdominal pain, nausea, vomiting, dysphagia, diarrhea, constipation, polyuria, polydipsia, dysuria, hematuria, frequency, urgency, abnormal bleeding or bruising, fever, chills, unexpected weight changes, mood swings, change in skin or hair texture, change in voice quality, auditory or visual problems, allergic reactions or rashes, new musculoskeletal complaints other than usual "aches and pains".   PHYSICAL EXAM BP 144/74  Pulse 77  Ht 5\' 5"  (1.651 m)  Wt 69.809 kg (153 lb 14.4 oz)  BMI  25.61 kg/m2  General: Alert, oriented x3, no distress Head: no evidence of trauma, PERRL, EOMI, no exophtalmos or lid lag, no myxedema, no xanthelasma; normal ears, nose and oropharynx Neck: normal jugular venous pulsations and no hepatojugular reflux; brisk carotid pulses without delay and no carotid bruits Chest: clear to auscultation, no signs of consolidation by percussion or palpation, normal fremitus, symmetrical and full respiratory excursions; of the subclavian pacemaker site Cardiovascular: normal position and quality of the apical impulse, regular rhythm, normal first and second heart sounds, no murmurs, rubs or gallops Abdomen: no tenderness or distention, no masses by palpation, no abnormal pulsatility or arterial bruits, normal bowel sounds, no hepatosplenomegaly Extremities: no  clubbing, cyanosis or edema; 2+ radial, ulnar and brachial pulses bilaterally; 2+ right femoral, posterior tibial and dorsalis pedis pulses; 2+ left femoral, posterior tibial and dorsalis pedis pulses; no subclavian or femoral bruits Neurological: grossly nonfocal   EKG: Atrial paced ventricular sensed, anterior fascicular block  Lipid Panel     Component Value Date/Time   CHOL 401* 04/16/2013 0732   TRIG 291* 04/16/2013 0732   HDL 52 04/16/2013 0732   CHOLHDL 7.7 04/16/2013 0732   VLDL 58* 04/16/2013 0732   LDLCALC 291* 04/16/2013 0732    BMET    Component Value Date/Time   NA 143 04/16/2013 0732   K 5.7* 04/16/2013 0732   CL 101 04/16/2013 0732   CO2 30 04/16/2013 0732   GLUCOSE 101* 04/16/2013 0732   BUN 29* 04/16/2013 0732   CREATININE 1.39* 04/16/2013 0732   CREATININE 0.93 12/13/2012 0454   CALCIUM 10.0 04/16/2013 0732   GFRNONAA 53* 12/13/2012 0454   GFRAA 61* 12/13/2012 0454     ASSESSMENT AND PLAN CAD, CABG X 3 10/09. Myoview low risk 10/10. EF >55% 2D 6/13. Currently asymptomatic. Preserved left ventricular systolic function. No evidence of congestive heart failure.  Pacemaker March 2010  (MDT) Medtronic EnRhythm implanted 2010 with battery voltage 2.99 (ERI 2.81 V), approximately 80% atrial pacing and virtually no ventricular pacing. The device has not shown any recent meaningful episodes of tach arrhythmia but there was one 26 second episode of rapid atrial tachycardia/atrial flutter with a cycle length of approximately 220 ms. Underlying rhythm is sinus bradycardia 50 beats per minute. Heart rate histograms show favorable distribution. Patient's activity log shows a significant improvement in activity since her acute illness 1 year go. She has yet to reach the level activity she had before that illness. He is a good candidate for [facility] remote monitoring.  PAF/SSS Years ago she had been on warfarin but interrogation of her pacemaker over the last for years has not shown atrial fibrillation she is currently on aspirin only for stroke prevention  Hyperlipidemia, statin intol. She has very severe hypercholesterolemia and unfortunately have not identified any regimen that she can tolerate. She developed severe aching in her thigh muscles with every lipid-lowering agent that has been tried. This includes numerous statins but also Zetia and colestipol. Niacin was poorly tolerated even in low doses. We'll have to wait until PSCK9 meters become available  Hypertension Good blood pressure control on the current regimen. Borderline and variable abnormalities in renal function noted.  Orders Placed This Encounter  Procedures  . EKG 12-Lead   Meds ordered this encounter  Medications  . furosemide (LASIX) 20 MG tablet    Sig: Take 1 tablet (20 mg total) by mouth daily as needed. For fluid retention. To take based on weight gain. Weight to be checked every morning    Dispense:  90 tablet    Refill:  3  . losartan (COZAAR) 100 MG tablet    Sig: Take 1 tablet (100 mg total) by mouth daily.    Dispense:  90 tablet    Refill:  3  . metoprolol succinate (TOPROL-XL) 25 MG 24 hr tablet     Sig: Take 1 tablet (25 mg total) by mouth daily.    Dispense:  90 tablet    Refill:  [place] Amnah Breuer, MD, [facility] [phone] office [phone] pager

## 2013-11-28 NOTE — Assessment & Plan Note (Signed)
Medtronic EnRhythm implanted 2010 with battery voltage 2.99 (ERI 2.81 V), approximately 80% atrial pacing and virtually no ventricular pacing. The device has not shown any recent meaningful episodes of tach arrhythmia but there was one 26 second episode of rapid atrial tachycardia/atrial flutter with a cycle length of approximately 220 ms. Underlying rhythm is sinus bradycardia 50 beats per minute. Heart rate histograms show favorable distribution. Patient's activity log shows a significant improvement in activity since her acute illness 1 year go. She has yet to reach the level activity she had before that illness. He is a good candidate for [facility] remote monitoring.

## 2013-11-28 NOTE — Assessment & Plan Note (Signed)
Years ago she had been on warfarin but interrogation of her pacemaker over the last for years has not shown atrial fibrillation she is currently on aspirin only for stroke prevention

## 2013-11-28 NOTE — Assessment & Plan Note (Addendum)
Currently asymptomatic. Preserved left ventricular systolic function. No evidence of congestive heart failure.

## 2013-11-28 NOTE — Assessment & Plan Note (Signed)
Good blood pressure control on the current regimen. Borderline and variable abnormalities in renal function noted.

## 2013-11-28 NOTE — Assessment & Plan Note (Signed)
She has very severe hypercholesterolemia and unfortunately have not identified any regimen that she can tolerate. She developed severe aching in her thigh muscles with every lipid-lowering agent that has been tried. This includes numerous statins but also Zetia and colestipol. Niacin was poorly tolerated even in low doses. We'll have to wait until PSCK9 meters become available

## 2013-11-29 DIAGNOSIS — Z6826 Body mass index (BMI) 26.0-26.9, adult: Secondary | ICD-10-CM | POA: Diagnosis not present

## 2013-11-29 DIAGNOSIS — N390 Urinary tract infection, site not specified: Secondary | ICD-10-CM | POA: Diagnosis not present

## 2013-12-04 DIAGNOSIS — L0100 Impetigo, unspecified: Secondary | ICD-10-CM | POA: Diagnosis not present

## 2013-12-04 DIAGNOSIS — L97909 Non-pressure chronic ulcer of unspecified part of unspecified lower leg with unspecified severity: Secondary | ICD-10-CM | POA: Diagnosis not present

## 2013-12-11 DIAGNOSIS — L97909 Non-pressure chronic ulcer of unspecified part of unspecified lower leg with unspecified severity: Secondary | ICD-10-CM | POA: Diagnosis not present

## 2013-12-11 DIAGNOSIS — L0100 Impetigo, unspecified: Secondary | ICD-10-CM | POA: Diagnosis not present

## 2013-12-16 DIAGNOSIS — L97909 Non-pressure chronic ulcer of unspecified part of unspecified lower leg with unspecified severity: Secondary | ICD-10-CM | POA: Diagnosis not present

## 2013-12-16 DIAGNOSIS — L0100 Impetigo, unspecified: Secondary | ICD-10-CM | POA: Diagnosis not present

## 2013-12-31 DIAGNOSIS — L570 Actinic keratosis: Secondary | ICD-10-CM | POA: Diagnosis not present

## 2013-12-31 DIAGNOSIS — Z85828 Personal history of other malignant neoplasm of skin: Secondary | ICD-10-CM | POA: Diagnosis not present

## 2013-12-31 DIAGNOSIS — D0430 Carcinoma in situ of skin of unspecified part of face: Secondary | ICD-10-CM | POA: Diagnosis not present

## 2013-12-31 DIAGNOSIS — D0439 Carcinoma in situ of skin of other parts of face: Secondary | ICD-10-CM | POA: Diagnosis not present

## 2013-12-31 DIAGNOSIS — D485 Neoplasm of uncertain behavior of skin: Secondary | ICD-10-CM | POA: Diagnosis not present

## 2014-01-16 DIAGNOSIS — L570 Actinic keratosis: Secondary | ICD-10-CM | POA: Diagnosis not present

## 2014-01-16 DIAGNOSIS — C44320 Squamous cell carcinoma of skin of unspecified parts of face: Secondary | ICD-10-CM | POA: Diagnosis not present

## 2014-02-27 ENCOUNTER — Ambulatory Visit (INDEPENDENT_AMBULATORY_CARE_PROVIDER_SITE_OTHER): Payer: Medicare Other | Admitting: *Deleted

## 2014-02-27 DIAGNOSIS — I498 Other specified cardiac arrhythmias: Secondary | ICD-10-CM | POA: Diagnosis not present

## 2014-03-20 DIAGNOSIS — H40019 Open angle with borderline findings, low risk, unspecified eye: Secondary | ICD-10-CM | POA: Diagnosis not present

## 2014-03-20 LAB — MDC_IDC_ENUM_SESS_TYPE_REMOTE
Battery Voltage: 2.99 V
Brady Statistic AP VP Percent: 0.32 %
Brady Statistic AS VS Percent: 17.93 %
Lead Channel Impedance Value: 520 Ohm
Lead Channel Sensing Intrinsic Amplitude: 15.0938
Lead Channel Sensing Intrinsic Amplitude: 2.9441
Lead Channel Setting Pacing Amplitude: 2 V
Lead Channel Setting Pacing Pulse Width: 0.8 ms
MDC IDC MSMT LEADCHNL RV IMPEDANCE VALUE: 784 Ohm
MDC IDC SESS DTM: 20150430135242
MDC IDC SET LEADCHNL RV PACING AMPLITUDE: 2.5 V
MDC IDC SET LEADCHNL RV SENSING SENSITIVITY: 0.9 mV
MDC IDC SET ZONE DETECTION INTERVAL: 450 ms
MDC IDC STAT BRADY AP VS PERCENT: 81.69 %
MDC IDC STAT BRADY AS VP PERCENT: 0.07 %
MDC IDC STAT BRADY RA PERCENT PACED: 82.01 %
MDC IDC STAT BRADY RV PERCENT PACED: 0.39 %
Zone Setting Detection Interval: 400 ms

## 2014-03-21 NOTE — Progress Notes (Signed)
Remote pacemaker transmission.   

## 2014-03-26 DIAGNOSIS — C44621 Squamous cell carcinoma of skin of unspecified upper limb, including shoulder: Secondary | ICD-10-CM | POA: Diagnosis not present

## 2014-04-03 ENCOUNTER — Telehealth: Payer: Self-pay | Admitting: Cardiovascular Disease

## 2014-04-03 NOTE — Telephone Encounter (Signed)
Patient states that she had her Losartan 100 mg # 90 1 daily refilled at [facility].  She has lost her bottle and called to see if [facility] would refill again.  Insurance will not cover another refill so soon and it will cost her 152.00.Marland Kitchen  Can she be changed to another medication that will be covered?

## 2014-04-03 NOTE — Telephone Encounter (Signed)
Spoke with patient after talking to Dr.Croitoru and getting clearance to switch to Irbesartan 300 mg QD. Patient states that if Irbesartan is going to cost more per month se does not want to switch, she will just pay the difference.

## 2014-04-04 ENCOUNTER — Encounter: Payer: Self-pay | Admitting: Cardiology

## 2014-04-09 IMAGING — CT CT CHEST W/ CM
2 of 5 series · 14 of 36 positions shown, 17 images · IV contrast (agent unspecified)
Comparison: No priors.

CLINICAL DATA: Neck pain.  Evaluate for possible perforated
esophagus.

CT CHEST WITH CONTRAST
TECHNIQUE: Multidetector CT imaging of the chest was performed
following the standard protocol during bolus administration of
intravenous contrast.
Contrast:  80 ml of Wmnipaque-QMM.

[Series 5: routine chest 5.0 st · axial · 0.67mm/px · z∈[-463,-133]mm · 11 of 75 slices shown, 14 images]
[im 5/75  mediastinal]
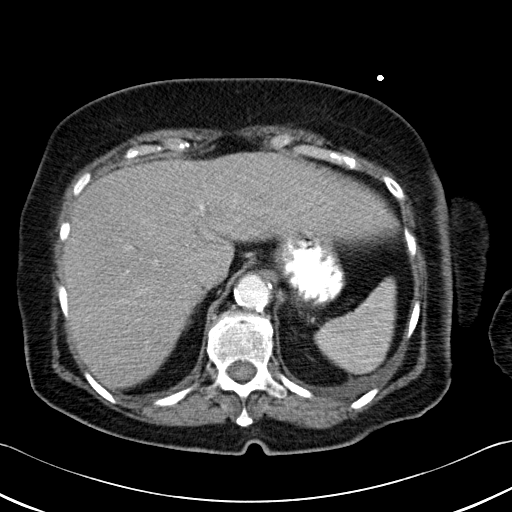
[im 5/75  lung]
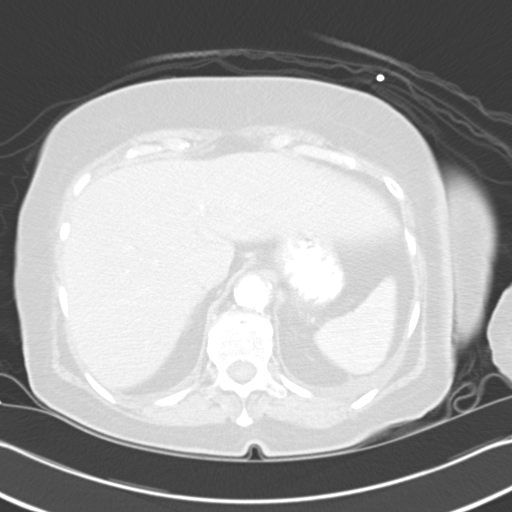
[im 10/75  lung]
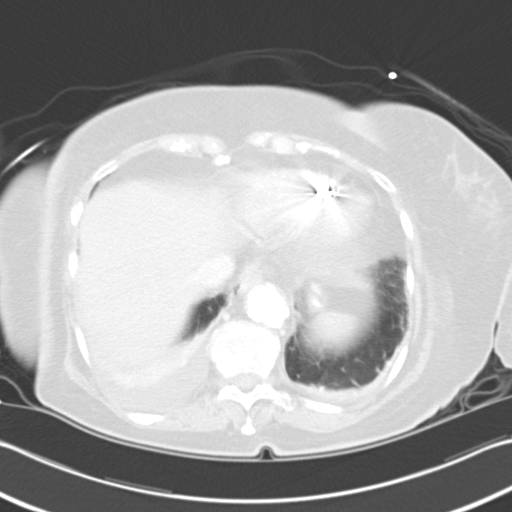
[im 20/75  lung]
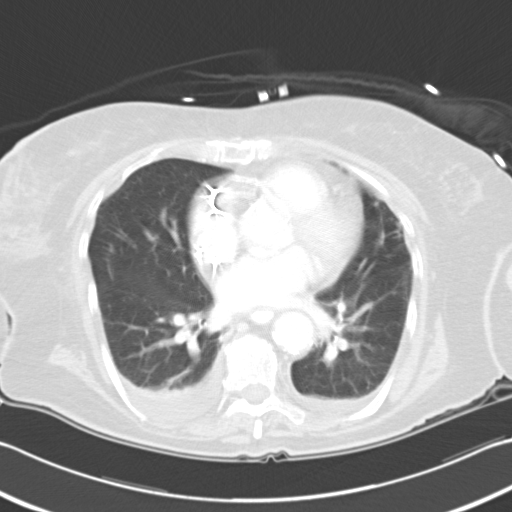
[im 25/75  lung]
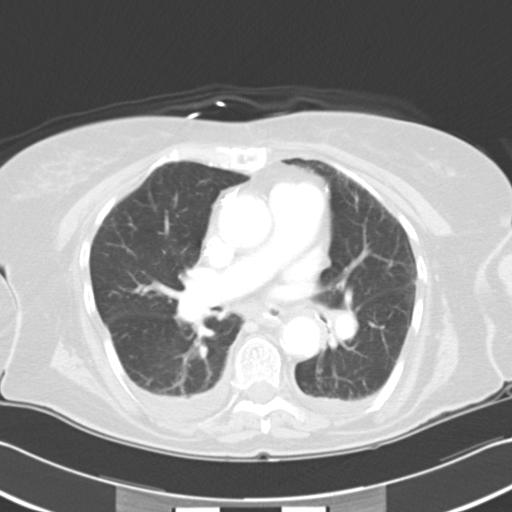
[im 30/75  mediastinal]
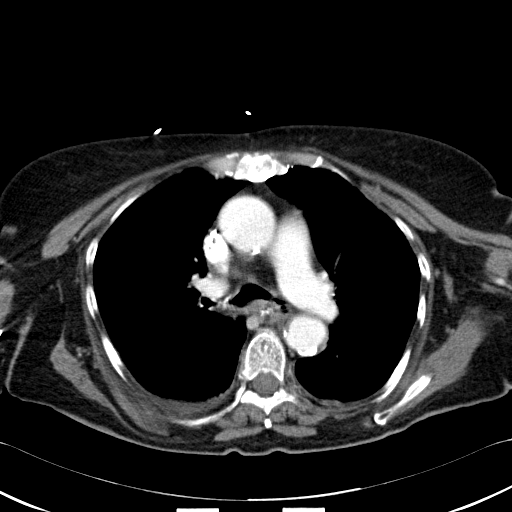
[im 30/75  lung]
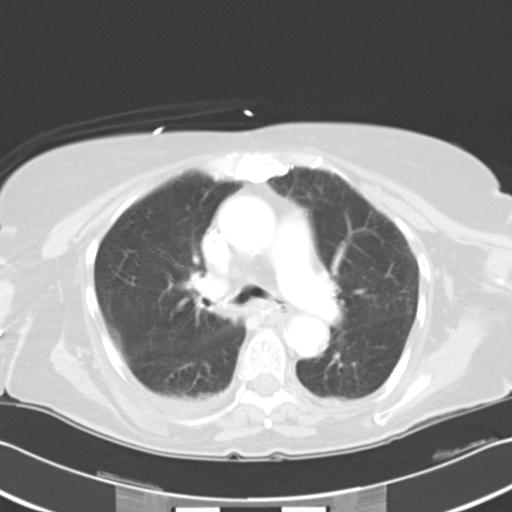
[im 40/75  lung]
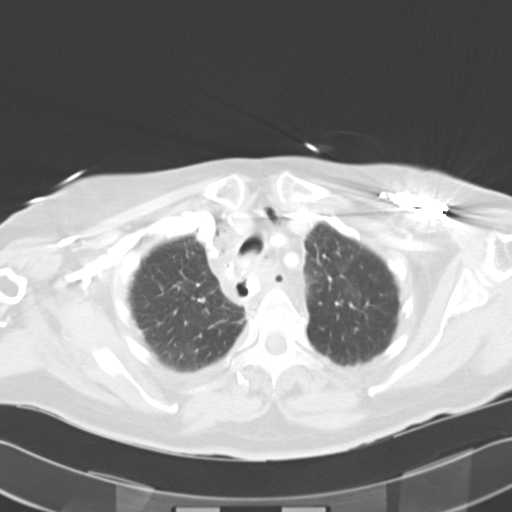
[im 45/75  lung]
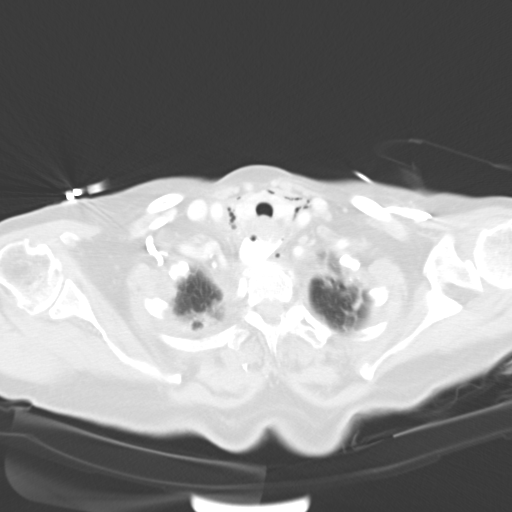
[im 50/75  lung]
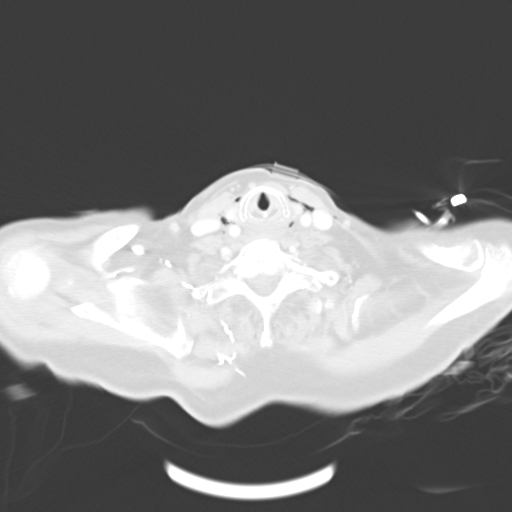
[im 55/75  mediastinal]
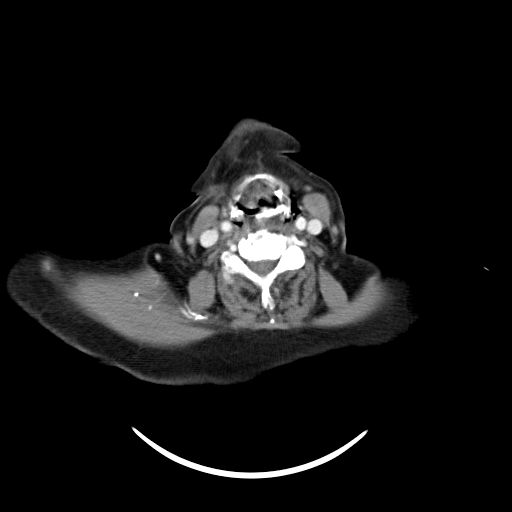
[im 55/75  lung]
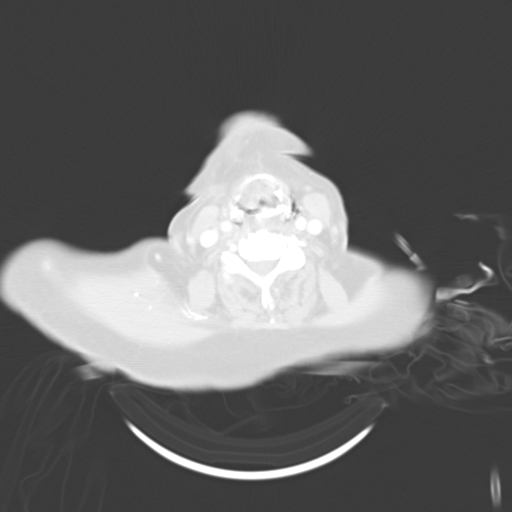
[im 65/75  lung]
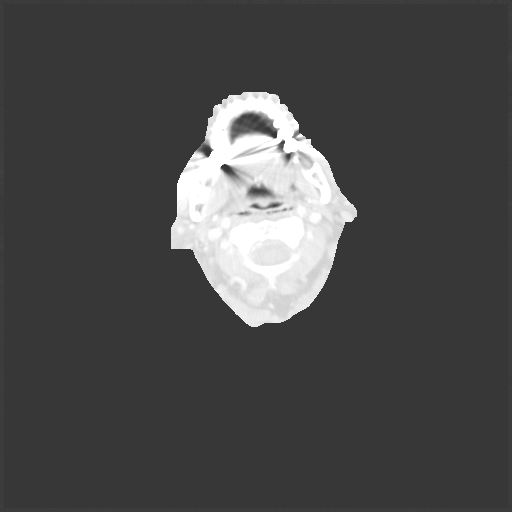
[im 70/75  lung]
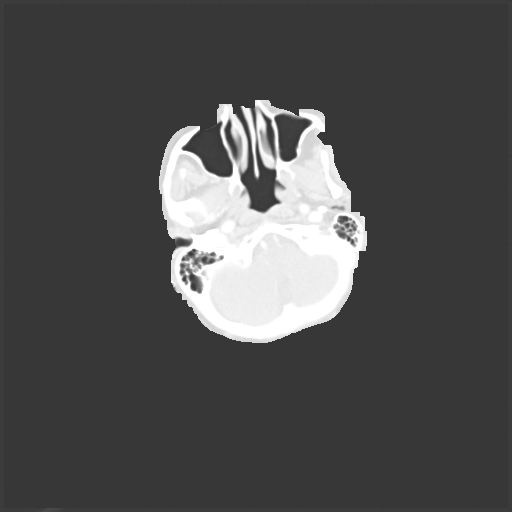

[Series 8: cor · coronal · 0.73mm/px · 3 of 102 slices shown]
[im 21/102  lung]
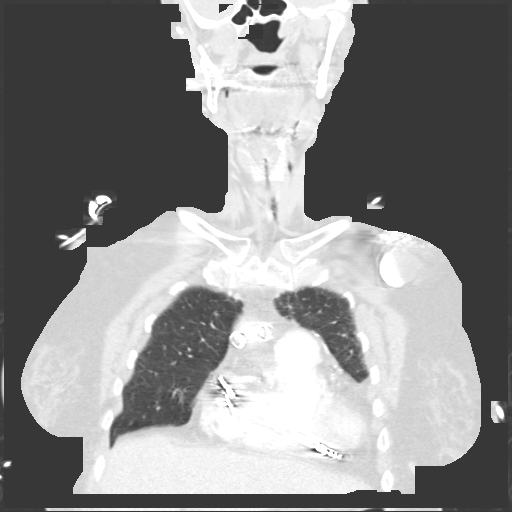
[im 41/102  lung]
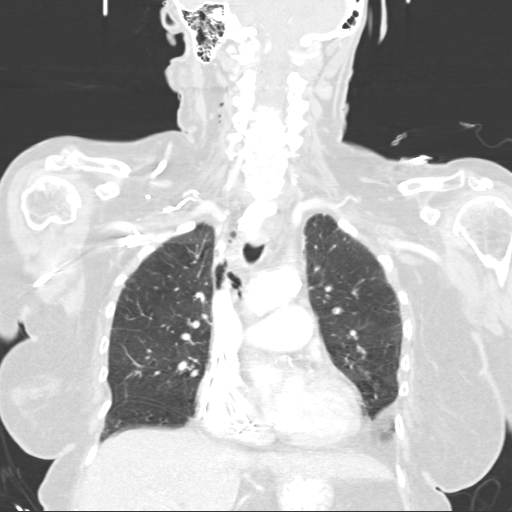
[im 61/102  lung]
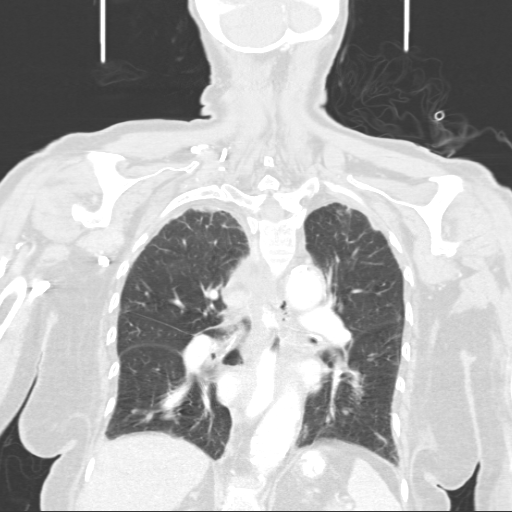

[14 of 36 positions shown; findings below may reference images not displayed]

FINDINGS: Mediastinum: Images 30-32 of series 5 demonstrate an apparent
defect in the posterior wall of the lower cervical esophagus with a
large amount of oral contrast material extending from this defect
into the adjacent posterior mediastinum.  This contrast material
and multiple locules of gas tract inferiorly through the
mediastinum to the right side of the esophagus to the level of the
aortic arch, and multiple locules of gas extend in the fat around
the carina.  There is also pneumomediastinum more anteriorly
throughout the middle and anterior mediastinum, as well as tracking
up into the soft tissues of the neck. A tiny amount of
pneumomediastinum is also noted adjacent to the distal esophagus
shortly above the esophageal hiatus.

Heart size is mildly enlarged. There is no significant pericardial
fluid, thickening or pericardial calcification.  A left sided
pacemaker device in place with leads terminating in the right
atrium and right ventricular apex.There is atherosclerosis of the
thoracic aorta, the great vessels of the mediastinum and the
coronary arteries, including calcified atherosclerotic plaque in
the left main, left anterior descending, left circumflex and right
coronary arteries. The patient is status post median sternotomy for
CABG with [REDACTED] to the LAD.  There are also two other graft
markers in place in the proximal descending thoracic aorta, but no
patent bypass grafts are confidently identified on this non gated
examination.

Lungs/Pleura: A small right pleural effusion and trace left pleural
effusion, both of which are layering dependently.  Slight diffuse
prominence of the interstitial markings.  No frank honeycombing or
traction bronchiectasis identified to suggest underlying
interstitial lung disease.  No acute consolidative airspace
disease.  Mild bilateral apical pleuroparenchymal thickening, most
compatible with scarring.

Upper Abdomen: Visualized portions are unremarkable.

Musculoskeletal: Sternotomy wires.  Mild deformity of several
anterolateral right ribs likely reflects old healed rib fractures.
There are no aggressive appearing lytic or blastic lesions noted in
the visualized portions of the skeleton.
IMPRESSION: 1.  Findings are compatible with perforation of the distal cervical
esophagus shortly above the level of the thoracic inlet with
extravasation of oral contrast material into the posterior
mediastinum, and extensive pneumomediastinum and cervical gas, as
above.
2.  Small right and trace left pleural effusions layer dependently
and are simple in appearance at this time.
3.  Mild cardiomegaly.
4. Atherosclerosis, including left main and three-vessel coronary
artery disease.  The patient is status post CABG with [REDACTED] to the
LAD.  Two additional graft markers are noted on the proximal
ascending thoracic aorta, and no definite patent bypass grafts are
seen extending from these graft markers at this time on this non
gated CT examination.

to Dr. Maameabena, who verbally acknowledged these results.

## 2014-04-10 ENCOUNTER — Encounter: Payer: Self-pay | Admitting: Cardiovascular Disease

## 2014-04-15 ENCOUNTER — Ambulatory Visit (INDEPENDENT_AMBULATORY_CARE_PROVIDER_SITE_OTHER): Payer: Medicare Other | Admitting: Cardiology

## 2014-04-15 ENCOUNTER — Encounter: Payer: Self-pay | Admitting: Cardiology

## 2014-04-15 ENCOUNTER — Inpatient Hospital Stay (HOSPITAL_COMMUNITY): Payer: Medicare Other

## 2014-04-15 ENCOUNTER — Encounter (HOSPITAL_COMMUNITY): Payer: Self-pay | Admitting: General Practice

## 2014-04-15 ENCOUNTER — Inpatient Hospital Stay (HOSPITAL_COMMUNITY)
Admission: AD | Admit: 2014-04-15 | Discharge: 2014-04-17 | DRG: 287 | Disposition: A | Discharge status: Home Health | Payer: Medicare Other | Source: Ambulatory Visit | Attending: Cardiology | Admitting: Cardiology

## 2014-04-15 VITALS — BP 144/80 | HR 78 | Ht 65.0 in | Wt 159.3 lb

## 2014-04-15 DIAGNOSIS — K219 Gastro-esophageal reflux disease without esophagitis: Secondary | ICD-10-CM | POA: Diagnosis present

## 2014-04-15 DIAGNOSIS — I2510 Atherosclerotic heart disease of native coronary artery without angina pectoris: Secondary | ICD-10-CM | POA: Diagnosis not present

## 2014-04-15 DIAGNOSIS — I2789 Other specified pulmonary heart diseases: Secondary | ICD-10-CM | POA: Diagnosis not present

## 2014-04-15 DIAGNOSIS — E039 Hypothyroidism, unspecified: Secondary | ICD-10-CM | POA: Diagnosis present

## 2014-04-15 DIAGNOSIS — R0609 Other forms of dyspnea: Secondary | ICD-10-CM | POA: Diagnosis not present

## 2014-04-15 DIAGNOSIS — Y849 Medical procedure, unspecified as the cause of abnormal reaction of the patient, or of later complication, without mention of misadventure at the time of the procedure: Secondary | ICD-10-CM | POA: Diagnosis present

## 2014-04-15 DIAGNOSIS — Z8249 Family history of ischemic heart disease and other diseases of the circulatory system: Secondary | ICD-10-CM | POA: Diagnosis not present

## 2014-04-15 DIAGNOSIS — E7800 Pure hypercholesterolemia, unspecified: Secondary | ICD-10-CM | POA: Diagnosis present

## 2014-04-15 DIAGNOSIS — I10 Essential (primary) hypertension: Secondary | ICD-10-CM

## 2014-04-15 DIAGNOSIS — Z823 Family history of stroke: Secondary | ICD-10-CM | POA: Diagnosis not present

## 2014-04-15 DIAGNOSIS — M129 Arthropathy, unspecified: Secondary | ICD-10-CM | POA: Diagnosis present

## 2014-04-15 DIAGNOSIS — T82897A Other specified complication of cardiac prosthetic devices, implants and grafts, initial encounter: Secondary | ICD-10-CM | POA: Diagnosis present

## 2014-04-15 DIAGNOSIS — I200 Unstable angina: Secondary | ICD-10-CM | POA: Diagnosis present

## 2014-04-15 DIAGNOSIS — I252 Old myocardial infarction: Secondary | ICD-10-CM | POA: Diagnosis not present

## 2014-04-15 DIAGNOSIS — I359 Nonrheumatic aortic valve disorder, unspecified: Secondary | ICD-10-CM

## 2014-04-15 DIAGNOSIS — J4489 Other specified chronic obstructive pulmonary disease: Secondary | ICD-10-CM | POA: Diagnosis present

## 2014-04-15 DIAGNOSIS — N183 Chronic kidney disease, stage 3 unspecified: Secondary | ICD-10-CM | POA: Diagnosis not present

## 2014-04-15 DIAGNOSIS — Z7982 Long term (current) use of aspirin: Secondary | ICD-10-CM | POA: Diagnosis not present

## 2014-04-15 DIAGNOSIS — Z950 Presence of cardiac pacemaker: Secondary | ICD-10-CM | POA: Diagnosis not present

## 2014-04-15 DIAGNOSIS — Z885 Allergy status to narcotic agent status: Secondary | ICD-10-CM

## 2014-04-15 DIAGNOSIS — Z951 Presence of aortocoronary bypass graft: Secondary | ICD-10-CM

## 2014-04-15 DIAGNOSIS — Z833 Family history of diabetes mellitus: Secondary | ICD-10-CM

## 2014-04-15 DIAGNOSIS — N1832 Chronic kidney disease, stage 3b: Secondary | ICD-10-CM | POA: Diagnosis present

## 2014-04-15 DIAGNOSIS — I82409 Acute embolism and thrombosis of unspecified deep veins of unspecified lower extremity: Secondary | ICD-10-CM

## 2014-04-15 DIAGNOSIS — J449 Chronic obstructive pulmonary disease, unspecified: Secondary | ICD-10-CM | POA: Diagnosis present

## 2014-04-15 DIAGNOSIS — R5381 Other malaise: Secondary | ICD-10-CM | POA: Diagnosis present

## 2014-04-15 DIAGNOSIS — E785 Hyperlipidemia, unspecified: Secondary | ICD-10-CM

## 2014-04-15 DIAGNOSIS — Z96649 Presence of unspecified artificial hip joint: Secondary | ICD-10-CM

## 2014-04-15 DIAGNOSIS — I4891 Unspecified atrial fibrillation: Secondary | ICD-10-CM

## 2014-04-15 DIAGNOSIS — N189 Chronic kidney disease, unspecified: Secondary | ICD-10-CM | POA: Diagnosis not present

## 2014-04-15 DIAGNOSIS — I480 Paroxysmal atrial fibrillation: Secondary | ICD-10-CM

## 2014-04-15 DIAGNOSIS — I129 Hypertensive chronic kidney disease with stage 1 through stage 4 chronic kidney disease, or unspecified chronic kidney disease: Secondary | ICD-10-CM | POA: Diagnosis present

## 2014-04-15 DIAGNOSIS — I358 Other nonrheumatic aortic valve disorders: Secondary | ICD-10-CM

## 2014-04-15 DIAGNOSIS — J984 Other disorders of lung: Secondary | ICD-10-CM | POA: Diagnosis not present

## 2014-04-15 HISTORY — DX: Shortness of breath: R06.02

## 2014-04-15 LAB — CBC WITH DIFFERENTIAL/PLATELET
BASOS ABS: 0.1 10*3/uL (ref 0.0–0.1)
BASOS PCT: 1 % (ref 0–1)
EOS ABS: 0.3 10*3/uL (ref 0.0–0.7)
EOS PCT: 4 % (ref 0–5)
HCT: 40.4 % (ref 36.0–46.0)
Hemoglobin: 12.9 g/dL (ref 12.0–15.0)
Lymphocytes Relative: 29 % (ref 12–46)
Lymphs Abs: 1.8 10*3/uL (ref 0.7–4.0)
MCH: 29.1 pg (ref 26.0–34.0)
MCHC: 31.9 g/dL (ref 30.0–36.0)
MCV: 91.2 fL (ref 78.0–100.0)
Monocytes Absolute: 0.5 10*3/uL (ref 0.1–1.0)
Monocytes Relative: 7 % (ref 3–12)
NEUTROS PCT: 59 % (ref 43–77)
Neutro Abs: 3.6 10*3/uL (ref 1.7–7.7)
Platelets: 203 10*3/uL (ref 150–400)
RBC: 4.43 MIL/uL (ref 3.87–5.11)
RDW: 13.6 % (ref 11.5–15.5)
WBC: 6.2 10*3/uL (ref 4.0–10.5)

## 2014-04-15 LAB — MAGNESIUM: MAGNESIUM: 2 mg/dL (ref 1.5–2.5)

## 2014-04-15 LAB — D-DIMER, QUANTITATIVE (NOT AT ARMC): D-Dimer, Quant: 1.11 ug/mL-FEU — ABNORMAL HIGH (ref 0.00–0.48)

## 2014-04-15 LAB — COMPREHENSIVE METABOLIC PANEL
ALBUMIN: 3.6 g/dL (ref 3.5–5.2)
ALT: 10 U/L (ref 0–35)
AST: 19 U/L (ref 0–37)
Alkaline Phosphatase: 108 U/L (ref 39–117)
BUN: 22 mg/dL (ref 6–23)
CALCIUM: 9.9 mg/dL (ref 8.4–10.5)
CO2: 29 mEq/L (ref 19–32)
Chloride: 99 mEq/L (ref 96–112)
Creatinine, Ser: 1.31 mg/dL — ABNORMAL HIGH (ref 0.50–1.10)
GFR calc Af Amer: 40 mL/min — ABNORMAL LOW (ref >90)
GFR calc non Af Amer: 35 mL/min — ABNORMAL LOW (ref >90)
Glucose, Bld: 87 mg/dL (ref 70–99)
Potassium: 4.4 mEq/L (ref 3.7–5.3)
Sodium: 140 mEq/L (ref 137–147)
TOTAL PROTEIN: 7 g/dL (ref 6.0–8.3)
Total Bilirubin: 0.2 mg/dL — ABNORMAL LOW (ref 0.3–1.2)

## 2014-04-15 LAB — PRO B NATRIURETIC PEPTIDE: PRO B NATRI PEPTIDE: 110.5 pg/mL (ref 0–450)

## 2014-04-15 LAB — APTT: aPTT: 25 seconds (ref 24–37)

## 2014-04-15 LAB — HEMOGLOBIN A1C
HEMOGLOBIN A1C: 5.8 % — AB (ref <5.7)
Mean Plasma Glucose: 120 mg/dL — ABNORMAL HIGH (ref <117)

## 2014-04-15 LAB — TROPONIN I: Troponin I: <0.3 ng/mL (ref <0.30)

## 2014-04-15 LAB — TSH: TSH: 5.46 u[IU]/mL — AB (ref 0.350–4.500)

## 2014-04-15 LAB — PROTIME-INR
INR: 0.88 (ref 0.00–1.49)
Prothrombin Time: 11.8 seconds (ref 11.6–15.2)

## 2014-04-15 MED ORDER — SODIUM CHLORIDE 0.9 % IJ SOLN: 3.0000 mL | INTRAMUSCULAR | Status: DC | PRN

## 2014-04-15 MED ORDER — HEPARIN (PORCINE) IN NACL 100-0.45 UNIT/ML-% IJ SOLN
850.0000 [IU]/h | INTRAMUSCULAR | Status: DC
Start: 2014-04-15 — End: 2014-04-15
  Filled 2014-04-15: qty 250

## 2014-04-15 MED ORDER — ALPRAZOLAM 0.25 MG PO TABS: 0.2500 mg | ORAL_TABLET | Freq: Two times a day (BID) | ORAL | Status: DC | PRN

## 2014-04-15 MED ORDER — HEPARIN (PORCINE) IN NACL 100-0.45 UNIT/ML-% IJ SOLN
850.0000 [IU]/h | INTRAMUSCULAR | Status: DC
  Administered 2014-04-15: 850 [IU]/h via INTRAVENOUS
  Filled 2014-04-15: qty 250

## 2014-04-15 MED ORDER — ACETAMINOPHEN 325 MG PO TABS
650.0000 mg | ORAL_TABLET | ORAL | Status: DC | PRN
Start: 2014-04-15 — End: 2014-04-17

## 2014-04-15 MED ORDER — LEVOTHYROXINE SODIUM 25 MCG PO TABS
25.0000 ug | ORAL_TABLET | Freq: Every day | ORAL | Status: DC
  Administered 2014-04-15 – 2014-04-17 (×3): 25 ug via ORAL
  Filled 2014-04-15 (×4): qty 1

## 2014-04-15 MED ORDER — METOPROLOL SUCCINATE ER 25 MG PO TB24
25.0000 mg | ORAL_TABLET | Freq: Every day | ORAL | Status: DC
  Administered 2014-04-15 – 2014-04-17 (×3): 25 mg via ORAL
  Filled 2014-04-15 (×3): qty 1

## 2014-04-15 MED ORDER — ONDANSETRON HCL 4 MG/2ML IJ SOLN: 4.0000 mg | Freq: Four times a day (QID) | INTRAMUSCULAR | Status: DC | PRN

## 2014-04-15 MED ORDER — ASPIRIN 81 MG PO CHEW
81.0000 mg | CHEWABLE_TABLET | ORAL | Status: AC
  Administered 2014-04-16: 81 mg via ORAL
  Filled 2014-04-15: qty 1

## 2014-04-15 MED ORDER — ASPIRIN EC 81 MG PO TBEC
81.0000 mg | DELAYED_RELEASE_TABLET | Freq: Every day | ORAL | Status: DC
  Administered 2014-04-17: 81 mg via ORAL
  Filled 2014-04-15: qty 1

## 2014-04-15 MED ORDER — NITROGLYCERIN 0.4 MG SL SUBL
0.4000 mg | SUBLINGUAL_TABLET | SUBLINGUAL | Status: DC | PRN
Start: 2014-04-15 — End: 2014-04-17

## 2014-04-15 MED ORDER — HEPARIN (PORCINE) IN NACL 100-0.45 UNIT/ML-% IJ SOLN
850.0000 [IU]/h | INTRAMUSCULAR | Status: DC
  Filled 2014-04-15: qty 250

## 2014-04-15 MED ORDER — SODIUM CHLORIDE 0.9 % IJ SOLN: 3.0000 mL | Freq: Two times a day (BID) | INTRAMUSCULAR | Status: DC

## 2014-04-15 MED ORDER — LORAZEPAM 1 MG PO TABS: 1.0000 mg | ORAL_TABLET | Freq: Every evening | ORAL | Status: DC | PRN

## 2014-04-15 MED ORDER — HEPARIN BOLUS VIA INFUSION
4000.0000 [IU] | Freq: Once | INTRAVENOUS | Status: AC
  Administered 2014-04-15: 4000 [IU] via INTRAVENOUS
  Filled 2014-04-15: qty 4000

## 2014-04-15 MED ORDER — ASPIRIN EC 81 MG PO TBEC: 81.0000 mg | DELAYED_RELEASE_TABLET | Freq: Every day | ORAL | Status: DC

## 2014-04-15 MED ORDER — TECHNETIUM TO 99M ALBUMIN AGGREGATED
6.0000 | Freq: Once | INTRAVENOUS | Status: AC | PRN
  Administered 2014-04-15: 6 via INTRAVENOUS

## 2014-04-15 MED ORDER — ASPIRIN 81 MG PO CHEW
324.0000 mg | CHEWABLE_TABLET | ORAL | Status: AC
  Administered 2014-04-15: 324 mg via ORAL
  Filled 2014-04-15: qty 4

## 2014-04-15 MED ORDER — PANTOPRAZOLE SODIUM 40 MG PO TBEC
40.0000 mg | DELAYED_RELEASE_TABLET | Freq: Every day | ORAL | Status: DC
Start: 2014-04-16 — End: 2014-04-17
  Administered 2014-04-16 – 2014-04-17 (×2): 40 mg via ORAL
  Filled 2014-04-15 (×2): qty 1

## 2014-04-15 MED ORDER — SODIUM CHLORIDE 0.9 % IV SOLN: 250.0000 mL | INTRAVENOUS | Status: DC | PRN

## 2014-04-15 MED ORDER — HEPARIN BOLUS VIA INFUSION
4000.0000 [IU] | Freq: Once | INTRAVENOUS | Status: DC
  Filled 2014-04-15: qty 4000

## 2014-04-15 MED ORDER — TECHNETIUM TC 99M DIETHYLENETRIAME-PENTAACETIC ACID: 40.0000 | Freq: Once | INTRAVENOUS | Status: AC | PRN

## 2014-04-15 MED ORDER — NITROGLYCERIN 2 % TD OINT
0.5000 [in_us] | TOPICAL_OINTMENT | Freq: Four times a day (QID) | TRANSDERMAL | Status: DC
  Filled 2014-04-15: qty 30

## 2014-04-15 MED ORDER — SODIUM CHLORIDE 0.9 % IV SOLN
INTRAVENOUS | Status: DC
  Administered 2014-04-15: 19:00:00 via INTRAVENOUS

## 2014-04-15 MED ORDER — SODIUM CHLORIDE 0.9 % IV SOLN: 1.0000 mL/kg/h | INTRAVENOUS | Status: DC

## 2014-04-15 MED ORDER — ASPIRIN 300 MG RE SUPP: 300.0000 mg | RECTAL | Status: AC

## 2014-04-15 NOTE — Progress Notes (Signed)
ANTICOAGULATION CONSULT NOTE - Initial Consult  Pharmacy Consult for Heparin Indication: chest pain/ACS  Allergies  Allergen Reactions  . Hydrocodone-Acetaminophen Itching    Tolerates tylenol    Patient Measurements: Height: 5\' 5"  (165.1 cm) (from office visit 04/15/14) Weight: 159 lb 4.8 oz (72.258 kg) (from office visit 04/15/14) IBW/kg (Calculated) : 57 Heparin Dosing Weight: 71.5 KG  Vital Signs: BP: 144/80 mmHg (06/16 1408) Pulse Rate: 78 (06/16 1408)  Labs: No results found for this basename: HGB, HCT, PLT, APTT, LABPROT, INR, HEPARINUNFRC, CREATININE, CKTOTAL, CKMB, TROPONINI,  in the last 72 hours  Estimated Creatinine Clearance: 26.8 ml/min (by C-G formula based on Cr of 1.39).   Medical History: Past Medical History  Diagnosis Date  . Hypertension   . Hypercholesteremia   . Myocardial infarction     Inferior STEMI 07/2008 - PCI of prox RCA followed byu CABG x 3 in 9/'09  . Pneumonia   . Anxiety   . CAD in native artery     s/p CABG x 3; Last Myoview in 07/2009 - non-ischemic; Echo 03/2012  Aortic Sclerosis with normal EF.  Marland Kitchen Asthma   . Pacemaker   . GERD (gastroesophageal reflux disease)   . Arthritis   . Status post hip hemiarthroplasty left hip by Dr Ronnie Derby 08/08/2012  . PAF (paroxysmal atrial fibrillation)   . CKD (chronic kidney disease), stage III   . DVT (deep vein thrombosis) in pregnancy   . Esophageal perforation     9/13  . Mediastinitis     s/p drainage  . Hypothyroidism     Medications:  Scheduled:  . aspirin  324 mg Oral NOW   Or  . aspirin  300 mg Rectal NOW  . [START ON 04/16/2014] aspirin EC  81 mg Oral Daily  . nitroGLYCERIN  0.5 inch Topical 4 times per day    Assessment: 80 y.o female to start on IV heparin infusion for ACS/ STEMI.  Baseline INR 0.88, CBC pending.  Not on oral anticoagulation prior to admission.  Past medical history as noted above. No bleeding reported.   Goal of Therapy:  Heparin level 0.3-0.7  units/ml Monitor platelets by anticoagulation protocol: Yes   Plan:  Heparin 4000 units IV bolus x1 Heparin drip at 850 units/hr Check 8 hour heparin level Daily heparin level and CBC.    Nicole Cella, RPh Clinical Pharmacist Pager: [phone] 04/15/2014,3:58 PM

## 2014-04-15 NOTE — Progress Notes (Signed)
Patient's d-dimer+1.11.  Amy Fuller PA paged and notified.  Will continue to monitor.  Sanda Linger

## 2014-04-15 NOTE — Patient Instructions (Signed)
Please go to [facility] will have a bed on [address]

## 2014-04-15 NOTE — Assessment & Plan Note (Signed)
Admitting to hospital for cardiac cath in am.

## 2014-04-15 NOTE — Progress Notes (Signed)
Ddimer elevated.  Will get VQ scan in anticipation of LHC in the morning.  Jaryn Hocutt, PAC

## 2014-04-15 NOTE — Progress Notes (Signed)
Pt presented with class IV to unstable angina to the office.  This is almost as bad as her MI.  CABG was 2009.  Dr. Ellyn Hack saw and we are admitting to tele with plan for cardiac cath in AM.  See hosp. H&P note.

## 2014-04-15 NOTE — H&P (Signed)
Amy [name] is an 80 y.o. female.    Primary Cardiologist:Dr. Bertrum Sol PCP:  Purvis Kilts, MD  Chief Complaint:Increasing DOE and chest pressure/heaviness  HPI: 80 year old female with hx of STEMI in 2009, rec'd stent to RCA and plans for surgery to LAD.  08/06/08 she underwent CABG X 3 LIMA-LAD;VG-ramus intermediate and VG-RCA.  She also has PPM and hx of PAF although none in recent years. PPM was evaluated 03/20/14 with symptoms and no atrial fib.  She presented to the office for increasing DOE since Jan 2015.  It has progressed so that she is unable to walk from 1 room to another.  + chest heaviness, no nausea or vomiting, no diaphoresis.  This has not awakened her from sleep.  Her EKG has not changed from previous tracings.  Last Echo 07/24/13: - Left ventricle: The cavity size was moderately reduced. Wall thickness was increased in a pattern of mild LVH. There was mild concentric hypertrophy. Systolic function was normal. The estimated ejection fraction was in the range of 55% to 60%. Wall motion was normal; there were no regional wall motion abnormalities. Doppler parameters are consistent with abnormal left ventricular relaxation (grade 1 diastolic dysfunction). - Aortic valve: Trivial regurgitation. Valve area: 1.47cm^2(VTI). Valve area: 1.33cm^2 (Vmax). - Mitral valve: Calcified annulus. Mildly calcified leaflets anterior. Mild regurgitation. Valve area by continuity equation (using LVOT flow): 2.56cm^2. - Right ventricle: Systolic pressure was increased. - Tricuspid valve: Moderate regurgitation. - Pulmonary arteries: PA peak pressure: 64mm Hg (S). Impressions: - The findings indicate no septal-lateral left ventricular wall dyssynchrony. The right ventricular systolic pressure was increased consistent with mild pulmonary hypertension.    Past Medical History  Diagnosis Date  . Hypertension   . Hypercholesteremia   . Myocardial infarction    Inferior STEMI 07/2008 - PCI of prox RCA followed byu CABG x 3 in 9/'09  . Pneumonia   . Anxiety   . CAD in native artery     s/p CABG x 3; Last Myoview in 07/2009 - non-ischemic; Echo 03/2012  Aortic Sclerosis with normal EF.  Marland Kitchen Asthma   . Pacemaker   . GERD (gastroesophageal reflux disease)   . Arthritis   . Status post hip hemiarthroplasty left hip by Dr Ronnie Derby 08/08/2012  . PAF (paroxysmal atrial fibrillation)   . CKD (chronic kidney disease), stage III   . DVT (deep vein thrombosis) in pregnancy   . Esophageal perforation     9/13  . Mediastinitis     s/p drainage  . Hypothyroidism     Past Surgical History  Procedure Laterality Date  . Coronary artery bypass graft  2009    LIMA-LAD, SVG-RI, SVG-stented RCA  . Total hip arthroplasty    . Pacemaker insertion  01/06/2009    [employer]  . Wrist fracture surgery  07/2012    left intra articular  with carpel tunnel  . Carpal tunnel release  08/08/2012    Procedure: CARPAL TUNNEL RELEASE;  Surgeon: Schuyler Amor, MD;  Location: [place] [facility];  Service: Orthopedics;  Laterality: Left;  . Hip arthroplasty  08/08/2012    Procedure: ARTHROPLASTY BIPOLAR HIP;  Surgeon: Rudean Haskell, MD;  Location: [facility];  Service: Orthopedics;  Laterality: Left;  [place]   . Esophagogastroduodenoscopy  08/10/2012    Procedure: ESOPHAGOGASTRODUODENOSCOPY (EGD);  Surgeon: Beryle Beams, MD;  Location: [facility] ENDOSCOPY;  Service: Endoscopy;  Laterality: N/A;  . Esophagoscopy  08/10/2012  Procedure: ESOPHAGOSCOPY;  Surgeon: [name], MD;  Location: [place];  Service: ENT;  Laterality: N/A;  . Gastrostomy  08/16/2012    Procedure: GASTROSTOMY;  Surgeon: Zenovia Jarred, MD;  Location: [place];  Service: General;  Laterality: N/A;  . Joint replacement    . Nm myocar perf wall motion  08/14/2009    Normal    Family History  Problem Relation Age of Onset  . Hypertension Mother   . Diabetes Mother   . Cancer Mother   . Hypertension Father   . Stroke Father     Social History:  reports that she has never smoked. She has never used smokeless tobacco. She reports that she does not drink alcohol or use illicit drugs.  Allergies:  Allergies  Allergen Reactions  . Hydrocodone-Acetaminophen Itching    Tolerates tylenol   OUTPATIENT MEDICATIONS: Aspirin 81 mg daily Synthroid 25 mcg daily Ativan 1 mg at bedtime as needed Cozaar 100 mg daily Toprol-XL 25 mg daily Prilosec 40 mg twice a day Nitroglycerin out of date  No results found for this or any previous visit (from the past 25 hour(s)). Dg Chest Port 1 View  04/15/2014   CLINICAL DATA:  Amy [name], shortness of breath, history coronary artery disease post MI and CABG, hypertension  EXAM: PORTABLE CHEST - 1 VIEW  COMPARISON:  Portable exam 1611 hr compared to 12/11/2012  FINDINGS: LEFT subclavian transvenous pacemaker leads project over RIGHT atrium and RIGHT ventricle.  Normal heart size post CABG.  Atherosclerotic calcification aorta.  Mediastinal contours and pulmonary vascularity otherwise normal.  Mild RIGHT upper lobe scarring and volume loss.  No acute infiltrate, pleural effusion or pneumothorax.  Bones demineralized.  IMPRESSION: Post CABG and pacemaker.  Minimal RIGHT upper lobe scarring and volume loss.  No acute abnormalities.   Electronically Signed   By: Lavonia Dana M.D.   On: 04/15/2014 16:23    ROS: General:no colds or fevers, no weight changes Skin:no rashes or ulcers HEENT:no blurred vision, no congestion CV:see HPI PUL:see HPI GI:no diarrhea constipation or melena, no indigestion GU:no hematuria, no dysuria MS:no joint pain, no claudication Neuro:no syncope, no lightheadedness Endo:no diabetes, + thyroid disease   Blood pressure 117/54, pulse 83, temperature 97.8 F (36.6 C), temperature source Oral, resp. rate 19, height 5\' 5"  (1.651 m), weight 159 lb (72.122 kg), SpO2 93.00%. PE: General:Pleasant affect, NAD, walks with a walker Skin:Warm and dry, brisk capillary  refill HEENT:normocephalic, sclera clear, mucus membranes moist Neck:supple, no JVD, no bruits, no adenopathy, no thyromegaly  Heart:S1S2 RRR without murmur, gallup, rub or click Lungs:clear without rales, rhonchi, or wheezes [id number], non tender, + BS, do not palpate liver spleen or masses Ext:no lower ext edema to trace, 2+ pedal pulses, 2+ radial pulses Neuro:alert and oriented X 3, MAE, follows commands, + facial symmetry    Assessment/Plan Principal Problem:   Unstable angina- increasing DOE and chest pressure - Dr. Ellyn Hack has seen and evaluated the patient with the patient's daughter present. Discussed options medical therapy, medical therapy and stress test, in hospitalization with cardiac catheterization. He was in agreement to proceed with hospitalization for unstable angina began treatment with heparin nitroglycerin paste and evaluate her renal function. We will start fluids today at 50 cc an hour with anticipation of cardiac catheterization tomorrow after lunch. Active Problems:   CAD, CABG X 3 10/09. Myoview low risk 10/10. EF >55% 2D 6/13.   Chronic renal insufficiency, stage III (moderate), SCr 1.4 Sept 2013- IV fluids to hydrate  and hold ARB until post cath   Pacemaker March 2010 (MDT), recent eval and no PAF   Hyperlipidemia with intolerance to statins: last lipid panel 03/2013 total cholesterol 401; TRIG 291; HDL 52; LDL 291.    [place] Nurse Practitioner Certified [place] Pager [phone] or after 5pm or weekends call [phone] 04/15/2014, 5:00 PM  I have seen and evaluated the patient this PM in clinic along with Cecilie Kicks, NP.  I agree with her findings, examination as well as impression recommendations.  Very pleasant elderly woman with prior CABG who has had progressively worsening exertional dyspnea -- now to the point of not being able to walk into to the office without profound dyspnea that is now complicated by chest tightness &  pressure that is alleviated by rest.  These symptoms have gotten progressively worse from Class II to Class IV @ present.  The progression is consistent with a crescendo pattern.   Her exam is relatively benign.    At this point, I feel that in this otherwise self sufficient elderly woman would be best served by proceeding with cardiac catheterization to investigate for possible SVG disease ~6 yrs post CABG.  She will be admitted to [facility] for Unstable Angina / Class IV with plans for [facility] +/- PCI by Dr. Claiborne Billings tomorrow (likely afternoon case).  IV Heparin & NTG paste.   Agree with D-dimer to r/o PE  Continue home CAD medications.    Further plans per Cath Report.   Leonie Man, M.D., M.S. Interventional Cardiologist   Pager # [phone] 04/15/2014

## 2014-04-16 ENCOUNTER — Ambulatory Visit (HOSPITAL_COMMUNITY): Admission: RE | Admit: 2014-04-16 | Payer: Medicare Other | Source: Ambulatory Visit | Admitting: Cardiovascular Disease

## 2014-04-16 ENCOUNTER — Encounter (HOSPITAL_COMMUNITY)
Admission: AD | Disposition: A | Discharge status: Home Health | Payer: Self-pay | Source: Ambulatory Visit | Attending: Cardiology

## 2014-04-16 DIAGNOSIS — N189 Chronic kidney disease, unspecified: Secondary | ICD-10-CM

## 2014-04-16 DIAGNOSIS — I2510 Atherosclerotic heart disease of native coronary artery without angina pectoris: Secondary | ICD-10-CM

## 2014-04-16 DIAGNOSIS — I200 Unstable angina: Secondary | ICD-10-CM | POA: Diagnosis not present

## 2014-04-16 DIAGNOSIS — I2789 Other specified pulmonary heart diseases: Secondary | ICD-10-CM | POA: Diagnosis not present

## 2014-04-16 DIAGNOSIS — T82897A Other specified complication of cardiac prosthetic devices, implants and grafts, initial encounter: Secondary | ICD-10-CM | POA: Diagnosis not present

## 2014-04-16 HISTORY — PX: LEFT HEART CATHETERIZATION WITH CORONARY ANGIOGRAM: SHX5451

## 2014-04-16 LAB — CBC
HCT: 36.3 % (ref 36.0–46.0)
Hemoglobin: 11.6 g/dL — ABNORMAL LOW (ref 12.0–15.0)
MCH: 28.6 pg (ref 26.0–34.0)
MCHC: 32 g/dL (ref 30.0–36.0)
MCV: 89.6 fL (ref 78.0–100.0)
PLATELETS: 172 10*3/uL (ref 150–400)
RBC: 4.05 MIL/uL (ref 3.87–5.11)
RDW: 13.6 % (ref 11.5–15.5)
WBC: 5.2 10*3/uL (ref 4.0–10.5)

## 2014-04-16 LAB — LIPID PANEL
CHOL/HDL RATIO: 6.2 ratio
Cholesterol: 293 mg/dL — ABNORMAL HIGH (ref 0–200)
HDL: 47 mg/dL (ref >39)
LDL Cholesterol: 210 mg/dL — ABNORMAL HIGH (ref 0–99)
Triglycerides: 178 mg/dL — ABNORMAL HIGH (ref <150)
VLDL: 36 mg/dL (ref 0–40)

## 2014-04-16 LAB — BASIC METABOLIC PANEL
BUN: 19 mg/dL (ref 6–23)
CO2: 28 mEq/L (ref 19–32)
Calcium: 9.4 mg/dL (ref 8.4–10.5)
Chloride: 101 mEq/L (ref 96–112)
Creatinine, Ser: 1.19 mg/dL — ABNORMAL HIGH (ref 0.50–1.10)
GFR calc Af Amer: 45 mL/min — ABNORMAL LOW (ref >90)
GFR, EST NON AFRICAN AMERICAN: 39 mL/min — AB (ref >90)
Glucose, Bld: 98 mg/dL (ref 70–99)
Potassium: 4.9 mEq/L (ref 3.7–5.3)
SODIUM: 141 meq/L (ref 137–147)

## 2014-04-16 LAB — POCT ACTIVATED CLOTTING TIME: Activated Clotting Time: 132 seconds

## 2014-04-16 LAB — HEPARIN LEVEL (UNFRACTIONATED): Heparin Unfractionated: 0.72 IU/mL — ABNORMAL HIGH (ref 0.30–0.70)

## 2014-04-16 LAB — TROPONIN I

## 2014-04-16 LAB — T4, FREE: FREE T4: 1.21 ng/dL (ref 0.80–1.80)

## 2014-04-16 SURGERY — LEFT HEART CATHETERIZATION WITH CORONARY ANGIOGRAM
Anesthesia: LOCAL

## 2014-04-16 MED ORDER — NITROGLYCERIN 0.2 MG/ML ON CALL CATH LAB
INTRAVENOUS | Status: AC
  Filled 2014-04-16: qty 1

## 2014-04-16 MED ORDER — MIDAZOLAM HCL 2 MG/2ML IJ SOLN
INTRAMUSCULAR | Status: AC
  Filled 2014-04-16: qty 2

## 2014-04-16 MED ORDER — SODIUM CHLORIDE 0.9 % IV SOLN
INTRAVENOUS | Status: AC
  Administered 2014-04-16: 13:00:00 via INTRAVENOUS

## 2014-04-16 MED ORDER — AMLODIPINE BESYLATE 5 MG PO TABS
5.0000 mg | ORAL_TABLET | Freq: Every day | ORAL | Status: DC
  Administered 2014-04-17: 5 mg via ORAL
  Filled 2014-04-16 (×2): qty 1

## 2014-04-16 MED ORDER — LIDOCAINE HCL (PF) 1 % IJ SOLN
INTRAMUSCULAR | Status: AC
  Filled 2014-04-16: qty 30

## 2014-04-16 MED ORDER — HEPARIN (PORCINE) IN NACL 2-0.9 UNIT/ML-% IJ SOLN
INTRAMUSCULAR | Status: AC
  Filled 2014-04-16: qty 1000

## 2014-04-16 NOTE — H&P (View-Only) (Signed)
SUBJECTIVE:  No complaints this am  OBJECTIVE:   Vitals:   Filed Vitals:   04/15/14 1500 04/15/14 1611 04/15/14 2008 04/16/14 0439  BP:  117/54 141/69 138/62  Pulse:  83 70 72  Temp:  97.8 F (36.6 C) 98.1 F (36.7 C) 97.9 F (36.6 C)  TempSrc:  Oral Oral Oral  Resp:  19 18 18   Height: 5\' 5"  (1.651 m) 5\' 5"  (1.651 m)    Weight: 159 lb 4.8 oz (72.258 kg) 159 lb (72.122 kg)  156 lb 11.2 oz (71.079 kg)  SpO2:  93% 94% 96%   I&O's:   Intake/Output Summary (Last 24 hours) at 04/16/14 1022 Last data filed at 04/16/14 0900  Gross per 24 hour  Intake    250 ml  Output      0 ml  Net    250 ml   TELEMETRY: Reviewed telemetry pt in atrial paced     PHYSICAL EXAM General: Well developed, well nourished, in no acute distress Head: Eyes PERRLA, No xanthomas.   Normal cephalic and atramatic  Lungs:   Clear bilaterally to auscultation and percussion. Heart:   HRRR S1 S2 Pulses are 2+ & equal. Abdomen: Bowel sounds are positive, abdomen soft and non-tender without masses  Extremities:   No clubbing, cyanosis or edema.  DP +1 Neuro: Alert and oriented X 3. Psych:  Good affect, responds appropriately   LABS: Basic Metabolic Panel:  Recent Labs  04/15/14 1621 04/16/14 0422  NA 140 141  K 4.4 4.9  CL 99 101  CO2 29 28  GLUCOSE 87 98  BUN 22 19  CREATININE 1.31* 1.19*  CALCIUM 9.9 9.4  MG 2.0  --    Liver Function Tests:  Recent Labs  04/15/14 1621  AST 19  ALT 10  ALKPHOS 108  BILITOT 0.2*  PROT 7.0  ALBUMIN 3.6   No results found for this basename: LIPASE, AMYLASE,  in the last 72 hours CBC:  Recent Labs  04/15/14 1621 04/16/14 0422  WBC 6.2 5.2  NEUTROABS 3.6  --   HGB 12.9 11.6*  HCT 40.4 36.3  MCV 91.2 89.6  PLT 203 172   Cardiac Enzymes:  Recent Labs  04/15/14 1621 04/15/14 2241 04/16/14 0422  TROPONINI <0.30 <0.30 <0.30   BNP: No components found with this basename: POCBNP,  D-Dimer:  Recent Labs  04/15/14 1621  DDIMER 1.11*    Hemoglobin A1C:  Recent Labs  04/15/14 1621  HGBA1C 5.8*   Fasting Lipid Panel:  Recent Labs  04/16/14 0422  CHOL 293*  HDL 47  LDLCALC 210*  TRIG 178*  CHOLHDL 6.2   Thyroid Function Tests:  Recent Labs  04/15/14 1621  TSH 5.460*   Anemia Panel: No results found for this basename: VITAMINB12, FOLATE, FERRITIN, TIBC, IRON, RETICCTPCT,  in the last 72 hours Coag Panel:   Lab Results  Component Value Date   INR 0.88 04/15/2014   INR 0.94 08/07/2012   INR 2.5* 08/18/2008    RADIOLOGY: Nm Pulmonary Perf And Vent  04/15/2014   CLINICAL DATA:  Shortness of breath and elevated D-dimer.  EXAM: NUCLEAR MEDICINE VENTILATION - PERFUSION LUNG SCAN  TECHNIQUE: Ventilation images were obtained in multiple projections using inhaled aerosol technetium 99 M DTPA. Perfusion images were obtained in multiple projections after intravenous injection of Tc-44m MAA.  RADIOPHARMACEUTICALS:  Forty mCi Tc-28m DTPA aerosol and 6 mCi Tc-71m MAA  COMPARISON:  Portable chest obtained earlier today.  FINDINGS: Ventilation: Multiple small rounded ventilation  defects bilaterally.  Perfusion: Mildly heterogeneous perfusion of both lungs with a subsegmental perfusion defect in the left upper lobe. No corresponding radiographic abnormality. There is a corresponding ventilation defect.  IMPRESSION: COPD.  Low probability for pulmonary embolism.   Electronically Signed   By: Enrique Sack M.D.   On: 04/15/2014 22:34   Dg Chest Port 1 View  04/15/2014   CLINICAL DATA:  [name], shortness of breath, history coronary artery disease post MI and CABG, hypertension  EXAM: PORTABLE CHEST - 1 VIEW  COMPARISON:  Portable exam 1611 hr compared to 12/11/2012  FINDINGS: LEFT subclavian transvenous pacemaker leads project over RIGHT atrium and RIGHT ventricle.  Normal heart size post CABG.  Atherosclerotic calcification aorta.  Mediastinal contours and pulmonary vascularity otherwise normal.  Mild RIGHT upper lobe scarring  and volume loss.  No acute infiltrate, pleural effusion or pneumothorax.  Bones demineralized.  IMPRESSION: Post CABG and pacemaker.  Minimal RIGHT upper lobe scarring and volume loss.  No acute abnormalities.   Electronically Signed   By: Lavonia Dana M.D.   On: 04/15/2014 16:23   Assessment/Plan  Principal Problem:  Unstable angina- increasing DOE and chest pressure - admitted through the office yesterday for unstable angina Active Problems:  CAD, CABG X 3 10/09. Myoview low risk 10/10. EF >55% 2D 6/13.  Chronic renal insufficiency, stage III (moderate), SCr 1.4 Sept 2013- IV fluids to hydrate and hold ARB until post cath  Pacemaker March 2010 (MDT), recent eval and no PAF  Hyperlipidemia with intolerance to statins: last lipid panel 03/2013 total cholesterol 401; TRIG 291; HDL 52; LDL 291.   1.  Very pleasant elderly woman with prior CABG who has had progressively worsening exertional dyspnea -- now to the point of not being able to walk into to the office without profound dyspnea that is now complicated by chest tightness & pressure that is alleviated by rest. These symptoms have gotten progressively worse from Class II to Class IV @ present. The progression is consistent with a crescendo pattern.  - Plan to proceed today with cardiac catheterization to investigate for possible SVG disease ~6 yrs post CABG.  - IV Heparin & NTG paste.  - Continue home CAD medications.  2.  Chronic renal insuff - creatinine improved this am to 1.19 with IVF hydration 3.  Elevated D-Dimer - VQ scan low prob for PE   Sueanne Margarita, MD  04/16/2014  10:22 AM

## 2014-04-16 NOTE — Progress Notes (Signed)
Utilization review completed.  

## 2014-04-16 NOTE — Interval H&P Note (Signed)
Cath Lab Visit (complete for each Cath Lab visit)  Clinical Evaluation Leading to the Procedure:   ACS: yes  Non-ACS:    Anginal Classification: CCS IV  Anti-ischemic medical therapy: Minimal Therapy (1 class of medications)  Non-Invasive Test Results: No non-invasive testing performed  Prior CABG: Previous CABG      History and Physical Interval Note:  04/16/2014 11:20 AM  Amy [name]  has presented today for surgery, with the diagnosis of unstable angina  The various methods of treatment have been discussed with the patient and family. After consideration of risks, benefits and other options for treatment, the patient has consented to  Procedure(s): LEFT HEART CATHETERIZATION WITH CORONARY ANGIOGRAM (N/A) as a surgical intervention .  The patient's history has been reviewed, patient examined, no change in status, stable for surgery.  I have reviewed the patient's chart and labs.  Questions were answered to the patient's satisfaction.     Kathlyn [place]

## 2014-04-16 NOTE — Progress Notes (Signed)
SUBJECTIVE:  No complaints this am  OBJECTIVE:   Vitals:   Filed Vitals:   04/15/14 1500 04/15/14 1611 04/15/14 2008 04/16/14 0439  BP:  117/54 141/69 138/62  Pulse:  83 70 72  Temp:  97.8 F (36.6 C) 98.1 F (36.7 C) 97.9 F (36.6 C)  TempSrc:  Oral Oral Oral  Resp:  19 18 18   Height: 5\' 5"  (1.651 m) 5\' 5"  (1.651 m)    Weight: 159 lb 4.8 oz (72.258 kg) 159 lb (72.122 kg)  156 lb 11.2 oz (71.079 kg)  SpO2:  93% 94% 96%   I&O's:   Intake/Output Summary (Last 24 hours) at 04/16/14 1022 Last data filed at 04/16/14 0900  Gross per 24 hour  Intake    250 ml  Output      0 ml  Net    250 ml   TELEMETRY: Reviewed telemetry pt in atrial paced     PHYSICAL EXAM General: Well developed, well nourished, in no acute distress Head: Eyes PERRLA, No xanthomas.   Normal cephalic and atramatic  Lungs:   Clear bilaterally to auscultation and percussion. Heart:   HRRR S1 S2 Pulses are 2+ & equal. Abdomen: Bowel sounds are positive, abdomen soft and non-tender without masses  Extremities:   No clubbing, cyanosis or edema.  DP +1 Neuro: Alert and oriented X 3. Psych:  Good affect, responds appropriately   LABS: Basic Metabolic Panel:  Recent Labs  04/15/14 1621 04/16/14 0422  NA 140 141  K 4.4 4.9  CL 99 101  CO2 29 28  GLUCOSE 87 98  BUN 22 19  CREATININE 1.31* 1.19*  CALCIUM 9.9 9.4  MG 2.0  --    Liver Function Tests:  Recent Labs  04/15/14 1621  AST 19  ALT 10  ALKPHOS 108  BILITOT 0.2*  PROT 7.0  ALBUMIN 3.6   No results found for this basename: LIPASE, AMYLASE,  in the last 72 hours CBC:  Recent Labs  04/15/14 1621 04/16/14 0422  WBC 6.2 5.2  NEUTROABS 3.6  --   HGB 12.9 11.6*  HCT 40.4 36.3  MCV 91.2 89.6  PLT 203 172   Cardiac Enzymes:  Recent Labs  04/15/14 1621 04/15/14 2241 04/16/14 0422  TROPONINI <0.30 <0.30 <0.30   BNP: No components found with this basename: POCBNP,  D-Dimer:  Recent Labs  04/15/14 1621  DDIMER 1.11*    Hemoglobin A1C:  Recent Labs  04/15/14 1621  HGBA1C 5.8*   Fasting Lipid Panel:  Recent Labs  04/16/14 0422  CHOL 293*  HDL 47  LDLCALC 210*  TRIG 178*  CHOLHDL 6.2   Thyroid Function Tests:  Recent Labs  04/15/14 1621  TSH 5.460*   Anemia Panel: No results found for this basename: VITAMINB12, FOLATE, FERRITIN, TIBC, IRON, RETICCTPCT,  in the last 72 hours Coag Panel:   Lab Results  Component Value Date   INR 0.88 04/15/2014   INR 0.94 08/07/2012   INR 2.5* 08/18/2008    RADIOLOGY: Nm Pulmonary Perf And Vent  04/15/2014   CLINICAL DATA:  Shortness of breath and elevated D-dimer.  EXAM: NUCLEAR MEDICINE VENTILATION - PERFUSION LUNG SCAN  TECHNIQUE: Ventilation images were obtained in multiple projections using inhaled aerosol technetium 99 M DTPA. Perfusion images were obtained in multiple projections after intravenous injection of Tc-84m MAA.  RADIOPHARMACEUTICALS:  Forty mCi Tc-41m DTPA aerosol and 6 mCi Tc-65m MAA  COMPARISON:  Portable chest obtained earlier today.  FINDINGS: Ventilation: Multiple small rounded ventilation  defects bilaterally.  Perfusion: Mildly heterogeneous perfusion of both lungs with a subsegmental perfusion defect in the left upper lobe. No corresponding radiographic abnormality. There is a corresponding ventilation defect.  IMPRESSION: COPD.  Low probability for pulmonary embolism.   Electronically Signed   By: Enrique Sack M.D.   On: 04/15/2014 22:34   Dg Chest Port 1 View  04/15/2014   CLINICAL DATA:  Amy [name], shortness of breath, history coronary artery disease post MI and CABG, hypertension  EXAM: PORTABLE CHEST - 1 VIEW  COMPARISON:  Portable exam 1611 hr compared to 12/11/2012  FINDINGS: LEFT subclavian transvenous pacemaker leads project over RIGHT atrium and RIGHT ventricle.  Normal heart size post CABG.  Atherosclerotic calcification aorta.  Mediastinal contours and pulmonary vascularity otherwise normal.  Mild RIGHT upper lobe scarring  and volume loss.  No acute infiltrate, pleural effusion or pneumothorax.  Bones demineralized.  IMPRESSION: Post CABG and pacemaker.  Minimal RIGHT upper lobe scarring and volume loss.  No acute abnormalities.   Electronically Signed   By: Lavonia Dana M.D.   On: 04/15/2014 16:23   Assessment/Plan  Principal Problem:  Unstable angina- increasing DOE and chest pressure - admitted through the office yesterday for unstable angina Active Problems:  CAD, CABG X 3 10/09. Myoview low risk 10/10. EF >55% 2D 6/13.  Chronic renal insufficiency, stage III (moderate), SCr 1.4 Sept 2013- IV fluids to hydrate and hold ARB until post cath  Pacemaker March 2010 (MDT), recent eval and no PAF  Hyperlipidemia with intolerance to statins: last lipid panel 03/2013 total cholesterol 401; TRIG 291; HDL 52; LDL 291.   1.  Very pleasant elderly woman with prior CABG who has had progressively worsening exertional dyspnea -- now to the point of not being able to walk into to the office without profound dyspnea that is now complicated by chest tightness & pressure that is alleviated by rest. These symptoms have gotten progressively worse from Class II to Class IV @ present. The progression is consistent with a crescendo pattern.  - Plan to proceed today with cardiac catheterization to investigate for possible SVG disease ~6 yrs post CABG.  - IV Heparin & NTG paste.  - Continue home CAD medications.  2.  Chronic renal insuff - creatinine improved this am to 1.19 with IVF hydration 3.  Elevated D-Dimer - VQ scan low prob for PE   Sueanne Margarita, MD  04/16/2014  10:22 AM

## 2014-04-16 NOTE — Progress Notes (Signed)
[place] for Heparin Indication: chest pain/ACS  Allergies  Allergen Reactions  . Hydrocodone-Acetaminophen Itching    Tolerates tylenol    Patient Measurements: Height: 5\' 5"  (165.1 cm) Weight: 156 lb 11.2 oz (71.079 kg) IBW/kg (Calculated) : 57 Heparin Dosing Weight: 71.5 KG  Vital Signs: Temp: 97.9 F (36.6 C) (06/17 0439) Temp src: Oral (06/17 0439) BP: 138/62 mmHg (06/17 0439) Pulse Rate: 72 (06/17 0439)  Labs:  Recent Labs  04/15/14 1621 04/15/14 2241 04/16/14 0330 04/16/14 0422  HGB 12.9  --   --  11.6*  HCT 40.4  --   --  36.3  PLT 203  --   --  172  APTT 25  --   --   --   LABPROT 11.8  --   --   --   INR 0.88  --   --   --   HEPARINUNFRC  --   --  0.72*  --   CREATININE 1.31*  --   --  1.19*  TROPONINI <0.30 <0.30  --  <0.30    Estimated Creatinine Clearance: 31.1 ml/min (by C-G formula based on Cr of 1.19).  Assessment: 80 y.o female with SOB, possible ACS, for heparin  Goal of Therapy:  Heparin level 0.3-0.7 units/ml Monitor platelets by anticoagulation protocol: Yes   Plan:  Expect heparin to decrease further after bolus, so will continue Heparin at current rate for now. Recheck level level this afternoon to verify  Phillis Knack, PharmD, BCPS  04/16/2014,5:44 AM

## 2014-04-16 NOTE — CV Procedure (Signed)
Cardiac Catheterization Procedure Note  Name: Amy [name] MRN: [id number] DOB: 1921/12/02  Procedure: Left Heart Cath, Selective Coronary Angiography, SVG angiography, LIMA angiography and LV angiography  Indication: Exertional dyspnea and possible unstable angina with known history of coronary artery disease status post RCA PCI in 2009 followed by three-vessel CABG.   Medications:  Sedation:  0.5 mg IV Versed, 0 mcg IV Fentanyl  Contrast:  50 mL Omnipaque  Procedural details: The right groin was prepped, draped, and anesthetized with 1% lidocaine. Using modified Seldinger technique, a 5 French sheath was introduced into the right femoral artery. Standard Judkins catheters were used for coronary angiography and left ventriculography. Catheter exchanges were performed over a guidewire. There were no immediate procedural complications. The patient was transferred to the post catheterization recovery area for further monitoring.   Procedural Findings:  Hemodynamics: AO:  171/75   mmHg LV:  168/5    mmHg LVEDP: 15  mmHg  Coronary angiography: Coronary dominance: Right   Left Main:  Normal  Left Anterior Descending (LAD):  Normal in size and moderately calcified. There is diffuse 90% disease in the proximal and midsegment with competitive flow distally from the LIMA.  Circumflex (LCx):  Normal in size and nondominant. There is 40% ostial stenosis. The rest of the vessel has minor irregularities.  1st obtuse marginal:  Very small in size.  2nd obtuse marginal:  Medium in size with minor irregularities.  3rd obtuse marginal:  Normal in size with no significant disease.   Ramus Intermedius:  Normal in size with 80% proximal disease. There is a small aneurysmal segment in the middle of the lesion. This does not appear to be different from cardiac catheterization in 2009.  Right Coronary Artery: Normal in size and dominant. A stent is noted proximally with 20% diffuse in-stent  restenosis. There is diffuse 20% disease in the midsegment with no evidence of obstructive disease.  Posterior descending artery: Normal in size with minor irregularities.  Posterior AV segment: Normal in size with no significant disease.  Posterolateral branchs:  No significant disease. SVG to RCA: Occluded at the ostium. SVG to ramus: Occluded at the ostium. LIMA to LAD, patent with no significant disease.  Left ventriculography: Left ventricular systolic function is normal , LVEF is estimated at 55 %, there is no significant mitral regurgitation   Final Conclusions:   1. Significant underlying three-vessel coronary artery disease with patent LIMA to LAD. Occluded SVG to RCA and SVG to ramus. However, the native RCA stent is patent with no evidence of obstructive disease in the vessel. The ramus artery has 80% proximal stenosis which is not different from what was seen in 2009. 2. Moderately elevated systemic hypertension with mildly elevated left ventricular end-diastolic pressure. 3. Normal LV systolic function.  Recommendations:  I suspect that the dyspnea is likely multifactorial and cannot be explained completely by cardiac findings. Certainly, she is probably ischemic in the ramus distribution. However, this is likely chronic as it does not appear to be different from 2009. The onset of occlusion of SVG to ramus is not entirely clear but I suspect that this has been the case for a while. I recommend optimizing medical therapy and controlling blood pressure. I added amlodipine. If there is continued concern for angina in the future, PCI of the ramus can be considered. She probably has significant lung disease as well from prolonged exposure to smoking.  Kathlyn Sacramento MD, [facility] 04/16/2014, 12:04 PM

## 2014-04-17 DIAGNOSIS — I200 Unstable angina: Secondary | ICD-10-CM

## 2014-04-17 LAB — CBC
HCT: 34.2 % — ABNORMAL LOW (ref 36.0–46.0)
HEMOGLOBIN: 11 g/dL — AB (ref 12.0–15.0)
MCH: 28.9 pg (ref 26.0–34.0)
MCHC: 32.2 g/dL (ref 30.0–36.0)
MCV: 89.8 fL (ref 78.0–100.0)
PLATELETS: 168 10*3/uL (ref 150–400)
RBC: 3.81 MIL/uL — AB (ref 3.87–5.11)
RDW: 13.6 % (ref 11.5–15.5)
WBC: 5.3 10*3/uL (ref 4.0–10.5)

## 2014-04-17 MED ORDER — WHITE PETROLATUM GEL
Status: DC | PRN
  Filled 2014-04-17: qty 5

## 2014-04-17 MED ORDER — NITROGLYCERIN 0.4 MG SL SUBL: 0.4000 mg | SUBLINGUAL_TABLET | SUBLINGUAL | Status: DC | PRN

## 2014-04-17 MED ORDER — NON FORMULARY: 1.0000 | Freq: Two times a day (BID) | Status: DC

## 2014-04-17 MED ORDER — FLUTICASONE-SALMETEROL 100-50 MCG/DOSE IN AEPB: 1.0000 | INHALATION_SPRAY | Freq: Two times a day (BID) | RESPIRATORY_TRACT | Status: DC

## 2014-04-17 MED ORDER — AMLODIPINE BESYLATE 5 MG PO TABS: 5.0000 mg | ORAL_TABLET | Freq: Every day | ORAL | Status: DC

## 2014-04-17 MED ORDER — LOSARTAN POTASSIUM 50 MG PO TABS: 50.0000 mg | ORAL_TABLET | Freq: Every day | ORAL | Status: DC

## 2014-04-17 NOTE — Discharge Summary (Signed)
CARDIOLOGY DISCHARGE SUMMARY   Patient ID: Amy [name] MRN: [id number] DOB/AGE: 1922-01-04 80 y.o.  Admit date: 04/15/2014 Discharge date: 04/18/2014  PCP: Amy Kilts, MD Primary Cardiologist: Dr. Sallyanne [name]  Primary Discharge Diagnosis:   Unstable angina  Secondary Discharge Diagnosis:    CAD, CABG X 3 10/09. Myoview low risk 10/10. EF >55% 2D 6/13.   Chronic renal insufficiency, stage III (moderate), SCr 1.4 Sept 2013   Pacemaker March 2010 (Amy [name])  Procedures: Left Heart Cath, Selective Coronary Angiography, SVG angiography, LIMA angiography and LV angiography, VQ scan   Hospital Course: Amy [name] is a 80 y.o. female with a history of CAD. She has been having problems with shortness of breath with exertion and occasional chest pain with unusual exertion. She has also noted some weakness. Her pacemaker was recently evaluated and showed no arrhythmia. She was seen in the office on 06/16 and sent to [facility] for further evaluation and treatment.  Her cardiac enzymes were negative for MI. Her symptoms were concerning for progressive or unstable angina, so cardiac catheterization was performed on 06/17.  Cardiac catheterization results are below. She has significant CAD, but LIMA-LAD is patent and the RCA is also patent. Her ramus intermedius has an 80% stenosis but this is unchanged since her previous catheterization. The SVG to ramus intermedius and the SVG to RCA are both occluded. Dr. Fletcher [name] reviewed the films and felt medical therapy was the best option. He added amlodipine to her medication regimen.  To evaluate the weakness, physical therapy consult was called. Home health physical therapy and occupational therapy were recommended for vestibular symptoms. These will be ordered.  To evaluate her shortness of breath, she was ambulated with staff to see she qualified for home O2. She was evaluated by pulmonary M.D. at one point in the past and told that she had asthma.  She worked in a tobacco factory for many years and her husband worked with asbestos so she felt she got exposure to that as well since it was on his clothes when he came home. She had been on Advair in the past but was no longer using it. She will be restarted on the Advair and follow up in the office.  On 06/18, she was seen by Dr. Radford [name] and all data were reviewed. She had been taken off her losartan on admission because of mild renal insufficiency and the need for a dye study with the heart catheterization. Her renal function was unchanged post-procedure and olmesartan will be restarted, albeit at a lower dose. Consideration was given to adding nitrates to her medication regimen to help treat her angina, but this will be deferred for now in the setting of aortic stenosis.  Amy [name] was ambulating without chest pain and her shortness of breath is at baseline. She is considered stable for discharge, to follow up as an outpatient.  Labs:   Lab Results  Component Value Date   WBC 5.3 04/17/2014   HGB 11.0* 04/17/2014   HCT 34.2* 04/17/2014   MCV 89.8 04/17/2014   PLT 168 04/17/2014     Recent Labs Lab 04/15/14 1621 04/16/14 0422  NA 140 141  K 4.4 4.9  CL 99 101  CO2 29 28  BUN 22 19  CREATININE 1.31* 1.19*  CALCIUM 9.9 9.4  PROT 7.0  --   BILITOT 0.2*  --   ALKPHOS 108  --   ALT 10  --   AST 19  --  GLUCOSE 87 98    Recent Labs  04/15/14 1621 04/15/14 2241 04/16/14 0422  TROPONINI <0.30 <0.30 <0.30   Lipid Panel     Component Value Date/Time   CHOL 293* 04/16/2014 0422   TRIG 178* 04/16/2014 0422   HDL 47 04/16/2014 0422   CHOLHDL 6.2 04/16/2014 0422   VLDL 36 04/16/2014 0422   LDLCALC 210* 04/16/2014 0422    Pro B Natriuretic peptide (BNP)  Date/Time Value Ref Range Status  04/15/2014  4:21 PM 110.5  0 - 450 pg/mL Final  09/07/2012  5:00 AM 526.0* 0 - 450 pg/mL Final    Recent Labs  04/15/14 1621  INR 0.88      Radiology: Nm Pulmonary Perf And Vent 04/15/2014    CLINICAL DATA:  Shortness of breath and elevated D-dimer.  EXAM: NUCLEAR MEDICINE VENTILATION - PERFUSION LUNG SCAN  TECHNIQUE: Ventilation images were obtained in multiple projections using inhaled aerosol technetium 99 M DTPA. Perfusion images were obtained in multiple projections after intravenous injection of Tc-66m MAA.  RADIOPHARMACEUTICALS:  Forty mCi Tc-108m DTPA aerosol and 6 mCi Tc-75m MAA  COMPARISON:  Portable chest obtained earlier today.  FINDINGS: Ventilation: Multiple small rounded ventilation defects bilaterally.  Perfusion: Mildly heterogeneous perfusion of both lungs with a subsegmental perfusion defect in the left upper lobe. No corresponding radiographic abnormality. There is a corresponding ventilation defect.  IMPRESSION: COPD.  Low probability for pulmonary embolism.   Electronically Signed   By: Amy [name] M.D.   On: 04/15/2014 22:34   Dg Chest Port 1 View 04/15/2014   CLINICAL DATA:  Amy [name], shortness of breath, history coronary artery disease post MI and CABG, hypertension  EXAM: PORTABLE CHEST - 1 VIEW  COMPARISON:  Portable exam 1611 hr compared to 12/11/2012  FINDINGS: LEFT subclavian transvenous pacemaker leads project over RIGHT atrium and RIGHT ventricle.  Normal heart size post CABG.  Atherosclerotic calcification aorta.  Mediastinal contours and pulmonary vascularity otherwise normal.  Mild RIGHT upper lobe scarring and volume loss.  No acute infiltrate, pleural effusion or pneumothorax.  Bones demineralized.  IMPRESSION: Post CABG and pacemaker.  Minimal RIGHT upper lobe scarring and volume loss.  No acute abnormalities.   Electronically Signed   By: Amy [name] M.D.   On: 04/15/2014 16:23   EKG:  16-Apr-2014 04:34:42   Atrial-paced rhythm with prolonged AV conduction Nonspecific ST and T wave abnormality Vent. rate 71 BPM PR interval 334 ms QRS duration 78 ms QT/QTc 382/415 ms P-R-T axes 20 -19 -9   Cardiac cath: 04/16/2014 Coronary angiography:  Coronary  dominance: Right  Left Main: Normal  Left Anterior Descending (LAD): Normal in size and moderately calcified. There is diffuse 90% disease in the proximal and midsegment with competitive flow distally from the LIMA.  Circumflex (LCx): Normal in size and nondominant. There is 40% ostial stenosis. The rest of the vessel has minor irregularities.  1st obtuse marginal: Very small in size.  2nd obtuse marginal: Medium in size with minor irregularities.  3rd obtuse marginal: Normal in size with no significant disease.  Ramus Intermedius: Normal in size with 80% proximal disease. There is a small aneurysmal segment in the middle of the lesion. This does not appear to be different from cardiac catheterization in 2009.  Right Coronary Artery: Normal in size and dominant. A stent is noted proximally with 20% diffuse in-stent restenosis. There is diffuse 20% disease in the midsegment with no evidence of obstructive disease.  Posterior descending artery: Normal in size with minor  irregularities.  Posterior AV segment: Normal in size with no significant disease.  Posterolateral branchs: No significant disease. SVG to RCA: Occluded at the ostium.  SVG to ramus: Occluded at the ostium.  LIMA to LAD, patent with no significant disease.  Left ventriculography: Left ventricular systolic function is normal , LVEF is estimated at 55 %, there is no significant mitral regurgitation  Final Conclusions:  1. Significant underlying three-vessel coronary artery disease with patent LIMA to LAD. Occluded SVG to RCA and SVG to ramus. However, the native RCA stent is patent with no evidence of obstructive disease in the vessel. The ramus artery has 80% proximal stenosis which is not different from what was seen in 2009.  2. Moderately elevated systemic hypertension with mildly elevated left ventricular end-diastolic pressure.  3. Normal LV systolic function.  Recommendations:  I suspect that the dyspnea is likely  multifactorial and cannot be explained completely by cardiac findings. Certainly, she is probably ischemic in the ramus distribution. However, this is likely chronic as it does not appear to be different from 2009. The onset of occlusion of SVG to ramus is not entirely clear but I suspect that this has been the case for a while.  I recommend optimizing medical therapy and controlling blood pressure. I added amlodipine. If there is continued concern for angina in the future, PCI of the ramus can be considered.  She probably has significant lung disease as well from prolonged exposure to tobacco and asbestos.  FOLLOW UP PLANS AND APPOINTMENTS Allergies  Allergen Reactions  . Hydrocodone-Acetaminophen Itching    Tolerates tylenol     Medication List         amLODipine 5 MG tablet  Commonly known as:  NORVASC  Take 1 tablet (5 mg total) by mouth daily.     aspirin 81 MG tablet  Take 81 mg by mouth daily.     Fluticasone-Salmeterol 100-50 MCG/DOSE Aepb  Commonly known as:  ADVAIR DISKUS  Inhale 1 puff into the lungs every 12 (twelve) hours.     LORazepam 1 MG tablet  Commonly known as:  ATIVAN  Take one tablet by mouth at bedtime as needed.     losartan 50 MG tablet  Commonly known as:  COZAAR  Take 1 tablet (50 mg total) by mouth daily.     metoprolol succinate 25 MG 24 hr tablet  Commonly known as:  TOPROL-XL  Take 1 tablet (25 mg total) by mouth daily.     nitroGLYCERIN 0.4 MG SL tablet  Commonly known as:  NITROSTAT  Place 1 tablet (0.4 mg total) under the tongue every 5 (five) minutes x 3 doses as needed for chest pain.     omeprazole 40 MG capsule  Commonly known as:  PRILOSEC  Take 40 mg by mouth 2 (two) times daily.     SYNTHROID 25 MCG tablet  Generic drug:  levothyroxine  Take 25 mcg by mouth daily.         Follow-up Information   Follow up with Lyda Jester, PA-C On 04/25/2014. (at 1:30 pm)    Specialty:  Cardiology   Contact information:   [address] [place] [address] [phone]       Follow up with [place] [facility]. (Cholesterol clinic, see at 10:45 AM)    Contact information:   [place] [address]       Follow up with [place]. [facility]- PT/OT (Vestibular) arranged )  Contact information:   [address] [place] [address] [phone]       BRING ALL MEDICATIONS WITH YOU TO FOLLOW UP APPOINTMENTS  Time spent with patient to include physician time: 49 min Signed: Rosaria Ferries, PA-C 04/18/2014, 8:05 AM Co-Sign MD

## 2014-04-17 NOTE — Evaluation (Signed)
Physical Therapy Evaluation Patient Details Name: Amy [name] MRN: [id number] DOB: 1921-11-02 Today's Date: 04/17/2014   History of Present Illness  Adm 04/15/14 with increased dyspnea on exertion and generalized weakness. Cardiac cath with no significant progression of CAD since last cath 2009. Likely COPD causing changes. PMHx-CAD, THR, CABG, pacemaker, fall with hip/wrist fx 2013  Clinical Impression  Pt presents with generalized weakness in part related to increasing motion sensitivity (which progresses to vomiting/near vomiting). Education provided re: vestibular system and how a mismatch with her vision produces these symptoms. Pt eager to try vestibular rehab as she almost cannot tolerate riding in the car more than 20 minutes. This has significantly impacted her ability to get out in the community with her daughter, and has even been effecting her activity level in her home.     Follow Up Recommendations Home health PT (Vestibular Rehab); HHOT for energy conservation and visual assessment    Equipment Recommendations  None recommended by PT    Recommendations for Other Services OT consult     Precautions / Restrictions Precautions Precautions: Fall Precaution Comments: pt/daughter report no falls since 2013 and began using walker      Mobility  Bed Mobility Overal bed mobility: Modified Independent                Transfers Overall transfer level: Modified independent Equipment used: Rolling walker (2 wheeled)                Ambulation/Gait Ambulation/Gait assistance: Supervision Ambulation Distance (Feet): 80 Feet Assistive device: Rolling walker (2 wheeled) Gait Pattern/deviations: WFL(Within Functional Limits) Gait velocity: Pt tends to walk fast; cues to slow down to control breathing; pt able to maintain once instructed   General Gait Details: mild dyspnea (perceivable to pt, not PT)  Stairs            Wheelchair Mobility    Modified  [place] (Stroke Patients Only)       Balance Overall balance assessment: History of Falls                                           Pertinent Vitals/Pain Dizziness/nausea up to 7/10 with visual assessment Denied pain     Home Living Family/patient expects to be discharged to:: Private residence Living Arrangements: Alone Available Help at Discharge: Family;Available PRN/intermittently Type of Home: House Home Access: Stairs to enter Entrance Stairs-Rails: Right Entrance Stairs-Number of Steps: 6 Home Layout: Laundry or work area in basement;Able to live on main level with bedroom/bathroom Home Equipment: Environmental consultant - 2 wheels;Walker - 4 wheels;Cane - single point;Bedside commode;Shower seat;Adaptive equipment (walkin shower; life line alert button)      Prior Function Level of Independence: Needs assistance   Gait / Transfers Assistance Needed: assist going up stairs; uses rollator primarily, sometimes RW  ADL's / Homemaking Assistance Needed: supervision in shower; family brings groceries, assist with housework        Hand Dominance        Extremity/Trunk Assessment   Upper Extremity Assessment: Generalized weakness           Lower Extremity Assessment: RLE deficits/detail;LLE deficits/detail RLE Deficits / Details: AROM WFL, hip flexion 4/5, knee ext 3+/5, ankle DF 5/5 LLE Deficits / Details: AROM WFL, hip flexion 4/5, knee ext 3+/5, ankle DF 5/5  Cervical / Trunk Assessment: Normal  Communication   Communication: No difficulties  Cognition Arousal/Alertness: Awake/alert Behavior During Therapy: WFL for tasks assessed/performed Overall Cognitive Status: Within Functional Limits for tasks assessed                      General Comments General comments (skin integrity, edema, etc.): Pt reported h/o "car sickness" that began to progressively worsen since 12/2013. She has even now had episodes at home. Smooth pursuits appeared [facility],  however caused nausea up to 7/10. Pt with intact VOR, even with head thrusts, however this increases her symptoms and her nystagmus may be imperceptible due to accomodation over the past several months.    Exercises Other Exercises Other Exercises: Provided pt/daughter with education re: vestibular system and possible hypofunction causing symptoms. Provided step one eye exercises (smooth pursuits for up to 30 seconds each direction). Educated on use of gaze stabilization (especially when riding in the car), riding with eyes closed, and possible use of meclizine if rehab does not begin to lessen her symptoms. Pt/daughter agreeable to HHPT for vestibular and conventional rehab. Also interested in [facility] for energy conservation techniques.      Assessment/Plan    PT Assessment All further PT needs can be met in the next venue of care  PT Diagnosis Difficulty walking;Generalized weakness   PT Problem List Decreased strength;Decreased activity tolerance;Decreased mobility;Decreased knowledge of precautions;Other (comment) (peripheral vestibular pathology)  PT Treatment Interventions     PT Goals (Current goals can be found in the Care Plan section) Acute Rehab PT Goals PT Goal Formulation: No goals set, d/c therapy    Frequency     Barriers to discharge        Co-evaluation               End of Session Equipment Utilized During Treatment: Gait belt Activity Tolerance: Treatment limited secondary to medical complications (Comment) Patient left: in chair;with family/visitor present           Time: 0263-7858 PT Time Calculation (min): 53 min   Charges:   PT Evaluation $Initial PT Evaluation Tier I: 1 Procedure PT Treatments $Gait Training: 8-22 mins $Neuromuscular Re-education: 8-22 mins   PT G Codes:          [name],[name] May 09, 2014, 12:16 PM Pager [phone]

## 2014-04-17 NOTE — Care Management Note (Signed)
    Page 1 of 1   04/17/2014     12:42:41 PM CARE MANAGEMENT NOTE 04/17/2014  Patient:  Amy [name], Amy [name]   Account Number:  [id number]  Date Initiated:  04/17/2014  Documentation initiated by:  Marvetta Gibbons  Subjective/Objective Assessment:   Pt admitted with [place]     Action/Plan:   PTA pt lived at home   Anticipated DC Date:  04/17/2014   Anticipated DC Plan:  [place]  CM consult      [place]   Choice offered to / List presented to:  C-1 Patient        [place] arranged  [place].   Status of service:  Completed, signed off Medicare Important Message given?  NA - LOS <3 / Initial given by admissions (If response is "NO", the following Medicare IM given date fields will be blank) Date Medicare IM given:   Date Additional Medicare IM given:    Discharge Disposition:  [place]  Per UR Regulation:  Reviewed for med. necessity/level of care/duration of stay  If discussed at [place] of Stay Meetings, dates discussed:    Comments:  04/17/14- 1200- Marvetta Gibbons RN, BSn [phone] Pt for d/c home today- recommendations for PT/OT with vestibular- orders to be placed- in to speak with pt and daughter- per conversation pt has used [facility] in the past and per choice would like to use them again. Referral called to Butch Penny with [facility]- referral accepted- services to begin within 24-48 hr post discharge.

## 2014-04-17 NOTE — Progress Notes (Signed)
Patient discharged to home.  Patient alert, oriented, verbally responsive, breathing regular and non-labored throughout, no s/s of distress noted throughout, no c/o pain throughout.  Discharge instructions verbalized to patient and daughter at bedside, both verbalized understanding throughout.  Patient left unit per wheelchair accompanied by volunteer and daughter.  VS WNL.  Dirk Dress 04/17/2014 1:55 PM

## 2014-04-17 NOTE — Progress Notes (Signed)
MEDICARE-CERTIFIED HOME HEALTH AGENCIES [place]   Agencies that are Medicare-Certified and affiliated with [facility]  Home Health Agency  Telephone Number Address  [facility]  The [facility] has ownership interest in this company; however, you are under no obligation to use this agency. [phone]  [address] [place], [place] [address]    Agencies that are Medicare-Certified and are not affiliated with [facility]   Home Health Agency Telephone Number Address  [facility] [phone] Fax [phone] [address] [place], [place]  [address]  [facility] [phone] [address] [place], [place] [address]  [facility]  [phone] Fax [phone] [address]  [place], [place]  [address]  [facility] [phone] or [phone] [address] [place], [place] [address]  [facility] [phone] or [phone] [address], [address] [place], [place]  [address]      Agencies that are not Medicare-Certified and are not affiliated with The  [facility]    Home Health Agency Telephone Number Address  [facility] [phone] Fax [phone] [address] [place], [place]  [address]  [facility] [phone] or [phone] Fax [phone] [address] [place], [place]  [address]  [facility]  [phone] [address] [place], [place]  [facility] [phone] Fax [phone] [address] [place], [place]  [address]  [facility] [phone] Fax [phone] [address] [place], [place]  [address]  [facility] [phone] [address] [place], [place]  [address]  [facility] [phone] Fax [phone] [address] [place], [place] [address]  [facility] [phone] [address] [place], [place]  [address]  [facility] [phone] Fax [phone] [address] [place], [place]  [address]    

## 2014-04-17 NOTE — Progress Notes (Signed)
Received telephone call from infection prevention nurse named Arbie Cookey.  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  Patient placed on contact isolation, order placed in computer.  Patient and daughter at bedside made aware.  Will continue to monitor.  Dirk Dress 04/17/2014

## 2014-04-17 NOTE — Progress Notes (Signed)
Home oxygen evaluation performed.  Patient oxygen saturation 95% on room air while dangling at bed edge.  Patient oxygen saturation ranging from 98%-100% on room air while ambulating with walker in hallway.  Patient returned to room after ambulating.  Sat in chair.  Oxygen saturation 97% on room air.    Dirk Dress 04/17/2014

## 2014-04-17 NOTE — Progress Notes (Signed)
Patient Name: Amy [name] Date of Encounter: 04/17/2014  Principal Problem:   Unstable angina Active Problems:   CAD, CABG X 3 10/09. Myoview low risk 10/10. EF >55% 2D 6/13.   Chronic renal insufficiency, stage III (moderate), SCr 1.4 Sept 2013   Pacemaker March 2010 (MDT)    Patient Profile: 80 yo female w/ hx CABG admitted w/ increasing angina. Cath 06/17 w/ SVG-RI 100%, timing unclear. Elevated LVEDP.  SUBJECTIVE: No chest pain. Uses walker at home. Problems w/ SOB w/ exertion, leg fatigue. Concerned that cath time/MD doing case changed more than once. Previously on Advair, wonder if this would help.  OBJECTIVE Filed Vitals:   04/16/14 1455 04/16/14 1600 04/16/14 2100 04/17/14 0610  BP: 140/57 141/59 148/68 129/54  Pulse: 70 69 75 70  Temp: 98.2 F (36.8 C) 98.2 F (36.8 C) 98.1 F (36.7 C) 98.3 F (36.8 C)  TempSrc:  Oral Oral Oral  Resp: 20 20 20 18   Height:      Weight:    156 lb 11.2 oz (71.079 kg)  SpO2: 96% 95% 96% 94%    Intake/Output Summary (Last 24 hours) at 04/17/14 0913 Last data filed at 04/16/14 2100  Gross per 24 hour  Intake    240 ml  Output    400 ml  Net   -160 ml   Filed Weights   04/15/14 1611 04/16/14 0439 04/17/14 0610  Weight: 159 lb (72.122 kg) 156 lb 11.2 oz (71.079 kg) 156 lb 11.2 oz (71.079 kg)    PHYSICAL EXAM General: Well developed, well nourished, female in no acute distress. Head: Normocephalic, atraumatic.  Neck: Supple without bruits, JVD not elevated Lungs:  Resp regular and unlabored, rales bases. Heart: RRR, S1, S2, no S3, S4, 2+ murmur; no rub. Abdomen: Soft, non-tender, non-distended, BS + x 4.  Extremities: No clubbing, cyanosis, no edema. Right groin without hematoma/bruit/ecchymosis Neuro: Alert and oriented X 3. Moves all extremities spontaneously. Psych: Normal affect.  LABS: CBC: Recent Labs  04/15/14 1621 04/16/14 0422 04/17/14 0325  WBC 6.2 5.2 5.3  NEUTROABS 3.6  --   --   HGB 12.9 11.6*  11.0*  HCT 40.4 36.3 34.2*  MCV 91.2 89.6 89.8  PLT 203 172 168   INR: Recent Labs  04/15/14 1621  INR [id number]   Basic Metabolic Panel: Recent Labs  04/15/14 1621 04/16/14 0422  NA 140 141  K 4.4 4.9  CL 99 101  CO2 29 28  GLUCOSE 87 98  BUN 22 19  CREATININE 1.31* 1.19*  CALCIUM 9.9 9.4  MG 2.0  --    Liver Function Tests: Recent Labs  04/15/14 1621  AST 19  ALT 10  ALKPHOS 108  BILITOT 0.2*  PROT 7.0  ALBUMIN 3.6   Cardiac Enzymes: Recent Labs  04/15/14 1621 04/15/14 2241 04/16/14 0422  TROPONINI <0.30 <0.30 <0.30   BNP: Pro B Natriuretic peptide (BNP)  Date/Time Value Ref Range Status  04/15/2014  4:21 PM 110.5  0 - 450 pg/mL Final  09/07/2012  5:00 AM 526.0* 0 - 450 pg/mL Final   D-dimer: Recent Labs  04/15/14 1621  DDIMER 1.11*   Hemoglobin A1C: Recent Labs  04/15/14 1621  HGBA1C 5.8*   Fasting Lipid Panel: Recent Labs  04/16/14 0422  CHOL 293*  HDL 47  LDLCALC 210*  TRIG 178*  CHOLHDL 6.2   Thyroid Function Tests: Recent Labs  04/15/14 1621  TSH 5.460*   TELE:  A pacing  Radiology/Studies: Nm Pulmonary Perf And Vent 04/15/2014   CLINICAL DATA:  Shortness of breath and elevated D-dimer.  EXAM: NUCLEAR MEDICINE VENTILATION - PERFUSION LUNG SCAN  TECHNIQUE: Ventilation images were obtained in multiple projections using inhaled aerosol technetium 99 M DTPA. Perfusion images were obtained in multiple projections after intravenous injection of Tc-68m MAA.  RADIOPHARMACEUTICALS:  Forty mCi Tc-47m DTPA aerosol and 6 mCi Tc-85m MAA  COMPARISON:  Portable chest obtained earlier today.  FINDINGS: Ventilation: Multiple small rounded ventilation defects bilaterally.  Perfusion: Mildly heterogeneous perfusion of both lungs with a subsegmental perfusion defect in the left upper lobe. No corresponding radiographic abnormality. There is a corresponding ventilation defect.  IMPRESSION: COPD.  Low probability for pulmonary embolism.   Electronically  Signed   By: Enrique Sack M.D.   On: 04/15/2014 22:34   Dg Chest Port 1 View 04/15/2014   CLINICAL DATA:  [name], shortness of breath, history coronary artery disease post MI and CABG, hypertension  EXAM: PORTABLE CHEST - 1 VIEW  COMPARISON:  Portable exam 1611 hr compared to 12/11/2012  FINDINGS: LEFT subclavian transvenous pacemaker leads project over RIGHT atrium and RIGHT ventricle.  Normal heart size post CABG.  Atherosclerotic calcification aorta.  Mediastinal contours and pulmonary vascularity otherwise normal.  Mild RIGHT upper lobe scarring and volume loss.  No acute infiltrate, pleural effusion or pneumothorax.  Bones demineralized.  IMPRESSION: Post CABG and pacemaker.  Minimal RIGHT upper lobe scarring and volume loss.  No acute abnormalities.   Electronically Signed   By: Lavonia Dana M.D.   On: 04/15/2014 16:23     Current Medications:  . amLODipine  5 mg Oral Daily  . aspirin EC  81 mg Oral Daily  . levothyroxine  25 mcg Oral QAC breakfast  . metoprolol succinate  25 mg Oral Daily  . nitroGLYCERIN  0.5 inch Topical 4 times per day  . pantoprazole  40 mg Oral Daily      ASSESSMENT AND PLAN: Principal Problem:   Unstable angina - medical therapy for CAD,  Added amlodipine, can change NTG paste to Imdur 30 Final Conclusions:  1. Significant underlying three-vessel coronary artery disease with patent LIMA to LAD. Occluded SVG to RCA and SVG to ramus. However, the native RCA stent is patent with no evidence of obstructive disease in the vessel. The ramus artery has 80% proximal stenosis which is not different from what was seen in 2009.  2. Moderately elevated systemic hypertension with mildly elevated left ventricular end-diastolic pressure.  3. Normal LV systolic function.  Recommendations:  I suspect that the dyspnea is likely multifactorial and cannot be explained completely by cardiac findings. Certainly, she is probably ischemic in the ramus distribution. However, this is  likely chronic as it does not appear to be different from 2009. The onset of occlusion of SVG to ramus is not entirely clear but I suspect that this has been the case for a while.  I recommend optimizing medical therapy and controlling blood pressure. I added amlodipine. If there is continued concern for angina in the future, PCI of the ramus can be considered.  She probably has significant lung disease as well from prolonged exposure to smoking. 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 Myoview low risk 10/10. EF >55% 2D 6/13. - see above    Chronic renal insufficiency, stage III (moderate), SCr 1.4 Sept 2013 - stable after  cath    Pacemaker March 2010 (MDT) - functioning OK    SOB - pt told previously she has ?asthma, will restart Advair and f/u w/ primary MD; will ck sats w/ ambulation see if qualifies for home O2    Weakness - suspect deconditioning, have PT see, may qualify for home PT.  Plan - d/c later today  Signed, Rosaria Ferries , PA-C 9:13 AM 04/17/2014

## 2014-04-18 NOTE — Discharge Summary (Signed)
Agree with discharge summary as outlined by Rosaria Ferries, PA-C

## 2014-04-18 NOTE — Progress Notes (Signed)
Patient seen and examined and agree with note as outlined by Rosaria Ferries, PA-C.  She is stable without any angina.  OK to d/c home later today.  Her ARB had been held due to cath and will restart at a lower dose since we have added amlodipine and her BP is borderline soft.

## 2014-04-21 DIAGNOSIS — I4891 Unspecified atrial fibrillation: Secondary | ICD-10-CM | POA: Diagnosis not present

## 2014-04-21 DIAGNOSIS — J45909 Unspecified asthma, uncomplicated: Secondary | ICD-10-CM | POA: Diagnosis not present

## 2014-04-21 DIAGNOSIS — I209 Angina pectoris, unspecified: Secondary | ICD-10-CM | POA: Diagnosis not present

## 2014-04-21 DIAGNOSIS — I2510 Atherosclerotic heart disease of native coronary artery without angina pectoris: Secondary | ICD-10-CM | POA: Diagnosis not present

## 2014-04-21 DIAGNOSIS — N183 Chronic kidney disease, stage 3 unspecified: Secondary | ICD-10-CM | POA: Diagnosis not present

## 2014-04-21 DIAGNOSIS — I129 Hypertensive chronic kidney disease with stage 1 through stage 4 chronic kidney disease, or unspecified chronic kidney disease: Secondary | ICD-10-CM | POA: Diagnosis not present

## 2014-04-21 DIAGNOSIS — Z5189 Encounter for other specified aftercare: Secondary | ICD-10-CM | POA: Diagnosis not present

## 2014-04-21 DIAGNOSIS — M159 Polyosteoarthritis, unspecified: Secondary | ICD-10-CM | POA: Diagnosis not present

## 2014-04-22 ENCOUNTER — Ambulatory Visit: Payer: Medicare Other | Admitting: Pharmacist

## 2014-04-24 DIAGNOSIS — Z5189 Encounter for other specified aftercare: Secondary | ICD-10-CM | POA: Diagnosis not present

## 2014-04-24 DIAGNOSIS — I4891 Unspecified atrial fibrillation: Secondary | ICD-10-CM | POA: Diagnosis not present

## 2014-04-24 DIAGNOSIS — I2510 Atherosclerotic heart disease of native coronary artery without angina pectoris: Secondary | ICD-10-CM | POA: Diagnosis not present

## 2014-04-24 DIAGNOSIS — M159 Polyosteoarthritis, unspecified: Secondary | ICD-10-CM | POA: Diagnosis not present

## 2014-04-24 DIAGNOSIS — I209 Angina pectoris, unspecified: Secondary | ICD-10-CM | POA: Diagnosis not present

## 2014-04-24 DIAGNOSIS — J45909 Unspecified asthma, uncomplicated: Secondary | ICD-10-CM | POA: Diagnosis not present

## 2014-04-25 ENCOUNTER — Encounter: Payer: Self-pay | Admitting: Cardiology

## 2014-04-25 ENCOUNTER — Ambulatory Visit (INDEPENDENT_AMBULATORY_CARE_PROVIDER_SITE_OTHER): Payer: Medicare Other | Admitting: Cardiology

## 2014-04-25 VITALS — BP 144/74 | HR 75 | Ht 65.0 in | Wt 156.6 lb

## 2014-04-25 DIAGNOSIS — I2510 Atherosclerotic heart disease of native coronary artery without angina pectoris: Secondary | ICD-10-CM | POA: Diagnosis not present

## 2014-04-25 NOTE — Progress Notes (Signed)
Patient ID: Amy [name], female   DOB: 1921/12/13, 80 y.o.   MRN: [id number]    04/25/2014 Amy M Bolender   [birth year]  [id number]  Primary Physicia Purvis Kilts, MD Primary Cardiologist: Dr Sallyanne Kuster  HPI:  The patient is a 80 year old female, followed by Dr. Sallyanne Kuster, with a history of CAD with history of remote CABG. She also has a pacemaker. She was admitted to [facility] on 04/15/2014 for evaluation of exertional chest discomfort. Cardiac enzymes were cycled and were negative for MI. As her symptoms were concerning for progressive/unstable angina, she was referred for cardiac catheterization. This was performed by [name] (see full cath report in chart). She has significant CAD, but LIMA-LAD is patent and the RCA is also patent. Her ramus intermedius has an 80% stenosis but this is unchanged since her previous catheterization. The SVG to ramus intermedius and the SVG to RCA are both occluded. [name] reviewed the films and felt medical therapy was the best option. He added amlodipine to her medication regimen. She was discharged home on 04/17/2014.  She presents back to clinic today for post hospital followup. She is accompanied by her daughter. She states that she has been doing fairly well since discharge. She states that she did however have GI upset earlier this week with diarrhea, nausea and vomiting. However she reports that she feels that this was a "stomach bug", as several other members of her household had similar symptoms. She states that she is much improved now and feeling better. Her symptoms have resolved. From a cardiac standpoint, she has no complaints. She denies any recurrent chest pain. No shortness of breath. She states that she has been fully compliant with her medications    Current Outpatient Prescriptions  Medication Sig Dispense Refill  . amLODipine (NORVASC) 5 MG tablet Take 1 tablet (5 mg total) by mouth daily.  30 tablet  11  . aspirin 81 MG tablet  Take 81 mg by mouth daily.      . Fluticasone-Salmeterol (ADVAIR DISKUS) 100-50 MCG/DOSE AEPB Inhale 1 puff into the lungs every 12 (twelve) hours.  60 each  3  . levothyroxine (SYNTHROID) 25 MCG tablet Take 25 mcg by mouth daily.      Marland Kitchen LORazepam (ATIVAN) 1 MG tablet Take one tablet by mouth at bedtime as needed.  30 tablet  5  . losartan (COZAAR) 50 MG tablet Take 1 tablet (50 mg total) by mouth daily.  30 tablet  3  . metoprolol succinate (TOPROL-XL) 25 MG 24 hr tablet Take 1 tablet (25 mg total) by mouth daily.  90 tablet  3  . nitroGLYCERIN (NITROSTAT) 0.4 MG SL tablet Place 1 tablet (0.4 mg total) under the tongue every 5 (five) minutes x 3 doses as needed for chest pain.  25 tablet  12  . omeprazole (PRILOSEC) 40 MG capsule Take 40 mg by mouth 2 (two) times daily.        No current facility-administered medications for this visit.    Allergies  Allergen Reactions  . Hydrocodone-Acetaminophen Itching    Tolerates tylenol    History   Social History  . Marital Status: Widowed    Spouse Name: N/A    Number of Children: N/A  . Years of Education: N/A   Occupational History  . Not on file.   Social History Main Topics  . Smoking status: Never Smoker   . Smokeless tobacco: Never Used  . Alcohol Use: No  . Drug Use:  No  . Sexual Activity: No   Other Topics Concern  . Not on file   Social History Narrative  . No narrative on file     Review of Systems: General: negative for chills, fever, night sweats or weight changes.  Cardiovascular: negative for chest pain, dyspnea on exertion, edema, orthopnea, palpitations, paroxysmal nocturnal dyspnea or shortness of breath Dermatological: negative for rash Respiratory: negative for cough or wheezing Urologic: negative for hematuria Abdominal: negative for nausea, vomiting, diarrhea, bright red blood per rectum, melena, or hematemesis Neurologic: negative for visual changes, syncope, or dizziness All other systems reviewed and  are otherwise negative except as noted above.    Blood pressure 144/74, pulse 75, height 5\' 5"  (1.651 m), weight 156 lb 9.6 oz (71.033 kg).  General appearance: alert, cooperative and no distress Neck: no carotid bruit and no JVD Heart: regular rate and rhythm and 1/6 SM Extremities: no LEE Pulses: 2+ and symmetric Skin: warm and dry Neurologic: Grossly normal  EKG NSR. HR 75 bpm.   ASSESSMENT AND PLAN:   1. CAD: Stable. No recurrent anginal symptoms. Continue medical therapy including aspirin, metoprolol, losartan and amlodipine.  2. permanent pacemaker: Routine 3 month pacer check scheduled for July 28.   PLAN  Patient is stable from a cardiac standpoint. Continue current plan of care. She is to followup with Dr. Sallyanne Kuster on July 28 for pacer check.  SIMMONS, BRITTAINYPA-C 04/25/2014 1:37 PM

## 2014-04-25 NOTE — Patient Instructions (Signed)
No changes were made today. Follow up with Dr.Croitoru as Directed: 05/27/14

## 2014-04-28 DIAGNOSIS — K208 Other esophagitis without bleeding: Secondary | ICD-10-CM | POA: Diagnosis not present

## 2014-04-28 DIAGNOSIS — Z6827 Body mass index (BMI) 27.0-27.9, adult: Secondary | ICD-10-CM | POA: Diagnosis not present

## 2014-04-28 DIAGNOSIS — K219 Gastro-esophageal reflux disease without esophagitis: Secondary | ICD-10-CM | POA: Diagnosis not present

## 2014-04-28 DIAGNOSIS — E039 Hypothyroidism, unspecified: Secondary | ICD-10-CM | POA: Diagnosis not present

## 2014-04-29 DIAGNOSIS — Z5189 Encounter for other specified aftercare: Secondary | ICD-10-CM | POA: Diagnosis not present

## 2014-04-29 DIAGNOSIS — I209 Angina pectoris, unspecified: Secondary | ICD-10-CM | POA: Diagnosis not present

## 2014-04-29 DIAGNOSIS — J45909 Unspecified asthma, uncomplicated: Secondary | ICD-10-CM | POA: Diagnosis not present

## 2014-04-29 DIAGNOSIS — I4891 Unspecified atrial fibrillation: Secondary | ICD-10-CM | POA: Diagnosis not present

## 2014-04-29 DIAGNOSIS — M159 Polyosteoarthritis, unspecified: Secondary | ICD-10-CM | POA: Diagnosis not present

## 2014-04-29 DIAGNOSIS — I2510 Atherosclerotic heart disease of native coronary artery without angina pectoris: Secondary | ICD-10-CM | POA: Diagnosis not present

## 2014-05-01 DIAGNOSIS — J45909 Unspecified asthma, uncomplicated: Secondary | ICD-10-CM | POA: Diagnosis not present

## 2014-05-01 DIAGNOSIS — M159 Polyosteoarthritis, unspecified: Secondary | ICD-10-CM | POA: Diagnosis not present

## 2014-05-01 DIAGNOSIS — I4891 Unspecified atrial fibrillation: Secondary | ICD-10-CM | POA: Diagnosis not present

## 2014-05-01 DIAGNOSIS — Z5189 Encounter for other specified aftercare: Secondary | ICD-10-CM | POA: Diagnosis not present

## 2014-05-01 DIAGNOSIS — I209 Angina pectoris, unspecified: Secondary | ICD-10-CM | POA: Diagnosis not present

## 2014-05-01 DIAGNOSIS — I2510 Atherosclerotic heart disease of native coronary artery without angina pectoris: Secondary | ICD-10-CM | POA: Diagnosis not present

## 2014-05-05 DIAGNOSIS — J45909 Unspecified asthma, uncomplicated: Secondary | ICD-10-CM | POA: Diagnosis not present

## 2014-05-05 DIAGNOSIS — I4891 Unspecified atrial fibrillation: Secondary | ICD-10-CM | POA: Diagnosis not present

## 2014-05-05 DIAGNOSIS — Z5189 Encounter for other specified aftercare: Secondary | ICD-10-CM | POA: Diagnosis not present

## 2014-05-05 DIAGNOSIS — I209 Angina pectoris, unspecified: Secondary | ICD-10-CM | POA: Diagnosis not present

## 2014-05-05 DIAGNOSIS — I2510 Atherosclerotic heart disease of native coronary artery without angina pectoris: Secondary | ICD-10-CM | POA: Diagnosis not present

## 2014-05-05 DIAGNOSIS — M159 Polyosteoarthritis, unspecified: Secondary | ICD-10-CM | POA: Diagnosis not present

## 2014-05-06 ENCOUNTER — Ambulatory Visit (INDEPENDENT_AMBULATORY_CARE_PROVIDER_SITE_OTHER): Payer: Medicare Other | Admitting: Pharmacist

## 2014-05-06 DIAGNOSIS — I2510 Atherosclerotic heart disease of native coronary artery without angina pectoris: Secondary | ICD-10-CM

## 2014-05-06 DIAGNOSIS — E785 Hyperlipidemia, unspecified: Secondary | ICD-10-CM | POA: Diagnosis not present

## 2014-05-06 NOTE — Progress Notes (Signed)
Patient is a 80 y.o. WF referred to lipid clinic by Rosaria Ferries given multi-vessel CAD, statin intolerance, and LDL > 200 mg/dL.  Patient had a CABG x 3 in 2009, and went to cath lab again recently, but not intervention needed - medical management decided upon.  Patient tells me that Crestor and lipitor caused severe muscle issues, and she feels she has permanent damage in her thighs due to these medications.  Has failed other statins as well, including Livalo and a few other she doesn't remember the names of.  Failed Colestipol, niacin, and Zetia as well in the past.  Not interested in taking statin 1-2 times per week.   Dr. Sallyanne Kuster mentioned to her about PCSK-9 inhibitors and patient wanted to discuss these with me today.  We have used these agents for past 3-4 years in clinical trial, and 2 of them will likely be approved in the next 6-8 weeks, however the indication and cost/insurance coverage is yet to be determined.  SPIRE-II clinical trial may also be an option.  LDL goal < 70, non-HDL goal < 100 Meds:  Not on lipid lowering meds due to intolerances  Labs: 03/2014:  TC 293, LDL 210, HDL 47, TG 178 - not on lipid lowering meds  Family History  Problem Relation Age of Onset  . Hypertension Mother   . Diabetes Mother   . Cancer Mother   . Hypertension Father   . Stroke Father    Current outpatient prescriptions:amLODipine (NORVASC) 5 MG tablet, Take 1 tablet (5 mg total) by mouth daily., Disp: 30 tablet, Rfl: 11;  aspirin 81 MG tablet, Take 81 mg by mouth daily., Disp: , Rfl: ;  Fluticasone-Salmeterol (ADVAIR DISKUS) 100-50 MCG/DOSE AEPB, Inhale 1 puff into the lungs every 12 (twelve) hours., Disp: 60 each, Rfl: 3;  levothyroxine (SYNTHROID) 25 MCG tablet, Take 25 mcg by mouth daily., Disp: , Rfl:  LORazepam (ATIVAN) 1 MG tablet, Take one tablet by mouth at bedtime as needed., Disp: 30 tablet, Rfl: 5;  losartan (COZAAR) 50 MG tablet, Take 1 tablet (50 mg total) by mouth daily., Disp: 30  tablet, Rfl: 3;  metoprolol succinate (TOPROL-XL) 25 MG 24 hr tablet, Take 1 tablet (25 mg total) by mouth daily., Disp: 90 tablet, Rfl: 3 nitroGLYCERIN (NITROSTAT) 0.4 MG SL tablet, Place 1 tablet (0.4 mg total) under the tongue every 5 (five) minutes x 3 doses as needed for chest pain., Disp: 25 tablet, Rfl: 12;  omeprazole (PRILOSEC) 40 MG capsule, Take 40 mg by mouth 2 (two) times daily. , Disp: , Rfl:   Allergies  Allergen Reactions  . Colestipol     Muscle aches  . Hydrocodone-Acetaminophen Itching    Tolerates tylenol  . Niacin And Related   . Statins     Muscle aches on all she has tried.  She recalls Lipitor, Crestor, and Livalo but thinks there were others  . Zetia [Ezetimibe]     Muscle aches

## 2014-05-06 NOTE — Assessment & Plan Note (Signed)
Patient and I had a long discussion about lipid lowering treatment options given her multiple failures, her age, her current LDL, and her multi-vessel CAD.  Patient is interested in PCSK-9 inhibitor therapy that Dr. Sallyanne Kuster mentioned to her, and we discussed this medication at length.  She and her daughter understand that it may not be covered for her, and that she may qualify for clinical trial if it isn't covered.  She would like for me to call her in 6-8 weeks once the FDA approval dates for these medications has come.  I will try to look into SPIRE-II to see if she qualifies with her history.  Will call her in a few months.

## 2014-05-08 ENCOUNTER — Telehealth: Payer: Self-pay | Admitting: *Deleted

## 2014-05-08 DIAGNOSIS — M159 Polyosteoarthritis, unspecified: Secondary | ICD-10-CM | POA: Diagnosis not present

## 2014-05-08 DIAGNOSIS — I2510 Atherosclerotic heart disease of native coronary artery without angina pectoris: Secondary | ICD-10-CM | POA: Diagnosis not present

## 2014-05-08 DIAGNOSIS — J45909 Unspecified asthma, uncomplicated: Secondary | ICD-10-CM | POA: Diagnosis not present

## 2014-05-08 DIAGNOSIS — I209 Angina pectoris, unspecified: Secondary | ICD-10-CM | POA: Diagnosis not present

## 2014-05-08 DIAGNOSIS — Z5189 Encounter for other specified aftercare: Secondary | ICD-10-CM | POA: Diagnosis not present

## 2014-05-08 DIAGNOSIS — I4891 Unspecified atrial fibrillation: Secondary | ICD-10-CM | POA: Diagnosis not present

## 2014-05-08 NOTE — Telephone Encounter (Signed)
Signed POC faxed to [place].

## 2014-05-13 DIAGNOSIS — J45909 Unspecified asthma, uncomplicated: Secondary | ICD-10-CM | POA: Diagnosis not present

## 2014-05-13 DIAGNOSIS — I2510 Atherosclerotic heart disease of native coronary artery without angina pectoris: Secondary | ICD-10-CM | POA: Diagnosis not present

## 2014-05-13 DIAGNOSIS — M159 Polyosteoarthritis, unspecified: Secondary | ICD-10-CM | POA: Diagnosis not present

## 2014-05-13 DIAGNOSIS — I4891 Unspecified atrial fibrillation: Secondary | ICD-10-CM | POA: Diagnosis not present

## 2014-05-13 DIAGNOSIS — I209 Angina pectoris, unspecified: Secondary | ICD-10-CM | POA: Diagnosis not present

## 2014-05-13 DIAGNOSIS — Z5189 Encounter for other specified aftercare: Secondary | ICD-10-CM | POA: Diagnosis not present

## 2014-05-14 ENCOUNTER — Encounter (INDEPENDENT_AMBULATORY_CARE_PROVIDER_SITE_OTHER): Payer: Self-pay | Admitting: *Deleted

## 2014-05-27 ENCOUNTER — Encounter: Payer: Self-pay | Admitting: Cardiovascular Disease

## 2014-05-27 ENCOUNTER — Ambulatory Visit (INDEPENDENT_AMBULATORY_CARE_PROVIDER_SITE_OTHER): Payer: Medicare Other | Admitting: Cardiovascular Disease

## 2014-05-27 VITALS — BP 138/62 | HR 72 | Resp 16 | Ht 65.0 in | Wt 161.5 lb

## 2014-05-27 DIAGNOSIS — I4891 Unspecified atrial fibrillation: Secondary | ICD-10-CM

## 2014-05-27 DIAGNOSIS — N183 Chronic kidney disease, stage 3 unspecified: Secondary | ICD-10-CM

## 2014-05-27 DIAGNOSIS — I2510 Atherosclerotic heart disease of native coronary artery without angina pectoris: Secondary | ICD-10-CM

## 2014-05-27 DIAGNOSIS — E785 Hyperlipidemia, unspecified: Secondary | ICD-10-CM | POA: Diagnosis not present

## 2014-05-27 DIAGNOSIS — Z950 Presence of cardiac pacemaker: Secondary | ICD-10-CM

## 2014-05-27 DIAGNOSIS — R0989 Other specified symptoms and signs involving the circulatory and respiratory systems: Secondary | ICD-10-CM

## 2014-05-27 DIAGNOSIS — R0609 Other forms of dyspnea: Secondary | ICD-10-CM

## 2014-05-27 DIAGNOSIS — I10 Essential (primary) hypertension: Secondary | ICD-10-CM

## 2014-05-27 DIAGNOSIS — I480 Paroxysmal atrial fibrillation: Secondary | ICD-10-CM

## 2014-05-27 DIAGNOSIS — I25810 Atherosclerosis of coronary artery bypass graft(s) without angina pectoris: Secondary | ICD-10-CM | POA: Diagnosis not present

## 2014-05-27 LAB — MDC_IDC_ENUM_SESS_TYPE_INCLINIC
Battery Voltage: 2.99 V
Brady Statistic AP VS Percent: 83.29 %
Brady Statistic AS VP Percent: 0.1 %
Brady Statistic AS VS Percent: 16.22 %
Brady Statistic RA Percent Paced: 83.69 %
Brady Statistic RV Percent Paced: 0.49 %
Lead Channel Impedance Value: 496 Ohm
Lead Channel Impedance Value: 736 Ohm
Lead Channel Pacing Threshold Amplitude: 1 V
Lead Channel Pacing Threshold Amplitude: 1 V
Lead Channel Pacing Threshold Pulse Width: 0.8 ms
Lead Channel Sensing Intrinsic Amplitude: 1.905
Lead Channel Sensing Intrinsic Amplitude: 20.1536
Lead Channel Setting Pacing Amplitude: 2 V
Lead Channel Setting Pacing Amplitude: 2.5 V
Lead Channel Setting Pacing Pulse Width: 0.8 ms
Lead Channel Setting Sensing Sensitivity: 0.9 mV
MDC IDC MSMT LEADCHNL RA PACING THRESHOLD PULSEWIDTH: 0.4 ms
MDC IDC SESS DTM: 20150728140610
MDC IDC STAT BRADY AP VP PERCENT: 0.39 %
Zone Setting Detection Interval: 400 ms
Zone Setting Detection Interval: 450 ms

## 2014-05-27 MED ORDER — FUROSEMIDE 20 MG PO TABS: 40.0000 mg | ORAL_TABLET | Freq: Every day | ORAL | Status: DC | PRN

## 2014-05-27 MED ORDER — ALBUTEROL SULFATE HFA 108 (90 BASE) MCG/ACT IN AERS: 2.0000 | INHALATION_SPRAY | RESPIRATORY_TRACT | Status: DC | PRN

## 2014-05-27 MED ORDER — POTASSIUM CHLORIDE ER 10 MEQ PO TBCR: 10.0000 meq | EXTENDED_RELEASE_TABLET | Freq: Every day | ORAL | Status: DC

## 2014-05-27 NOTE — Patient Instructions (Addendum)
Remote monitoring is used to monitor your pacemaker from home. This monitoring reduces the number of office visits required to check your device to one time per year. It allows [place] to keep an eye on the functioning of your device to ensure it is working properly. You are scheduled for a device check from home on 08-28-2014. You may send your transmission at any time that day. If you have a wireless device, the transmission will be sent automatically. After your physician reviews your transmission, you will receive a postcard with your next transmission date.  Your physician recommends that you schedule a follow-up appointment in: 6 months with Dr.Croitoru  Med changes -  1. STOP amlodipine 2. Use albuterol inhaler 2 puffs every 4 hours for wheezing or shortness of breath 3. Use furosemide (lasix) 2 - 20mg  tablets daily as needed for edema ((take potassium 28mEq once daily with this)

## 2014-05-29 NOTE — Progress Notes (Signed)
Patient ID: Niger M Grant, female   DOB: 04/13/1922, 80 y.o.   MRN: [id number]      Reason for office visit Pacemaker followup  Mrs. Christmas is a 80 year old woman with CAD, PAF and SS s/p pacemaker.  In 2009 she had an acute inferior wall ST segment elevation myocardial infarction treated with urgent angioplasty and placement of a bare-metal stent. Subsequently, she underwent multivessel bypass surgery with LIMA to LAD, SVG to ramus SVG to RCA. She has no evidence of residual myocardial injury and has preserved left ventricular systolic function.  In 2010 a permanent pacemaker (Medtronic Enrhythm) was implanted for symptomatic bradycardia. She has a remote history of paroxysmal atrial fibrillation but since pacemaker implantation has not had any recorded events. She is not taking anticoagulants at this time. She does take aspirin.  In late 2013 she had a fall complicated by a hip fracture and radius fracture. Unfortunately during her treatment she had an esophageal perforation complicated by mediastinitis. She had a protracted illness and had a feeding tube for several months but this has now been removed and she has made a remarkably strong recovery for a 80 year old. She walks without assistance. She lives independently, with help from her daughter. She has fairly severe hyperlipidemia with an LDL cholesterol in excess of 200. She is statin, niacin, ezetimibe and resin intolerant. She has discussed PSCK9 inhibitors with [employer].  In June 2015 she was admitted to the hospital. The patient states that she came for worsening dyspnea, but is slightly frustrated because she believes everybody focused on chest pain. She kept denying that she had had any chest discomfort.   She actually underwent cardiac catheterization on June 17 (90% proximal and mid LAD with patent distal LIMA bypass; 40% ostial circumflex, not bypassed; unchanged 80% proximal ramus intermedius, SVG to ramus occluded; patent stent  right coronary artery, SVG to right coronary artery occluded). Her left ventricular ejection fraction was 55%, there was no significant mitral regurgitation and if he end-diastolic pressure was high normal at 15 mm Hg, (when she was short of breath). [name] stated that her dyspnea is likely multifactorial and cannot be explained completely by cardiac findings.   Amlodipine was added for control of blood pressure. Since then she has noted that her blood pressure at home is often low (90-110/50-60 mm Hg) and that this makes her feel "foolish in the head". Since hospital discharge has also noticed that she has worsening symptoms of gastroesophageal reflux disease and more burping, especially if she writes in a car. She has been taking furosemide 20 mg almost daily for swelling. She often has wheezing and believes that the Advair helps. She has a long history of remote smoking.  Interrogation of her pacemaker shows normal device function. Her device is a dual-chamber Medtronic Enrhythm implanted in 2010. There is 84% atrial pacing and only 0.5% ventricular pacing. Battery voltage is 2.99 lymphs. Atrial fibrillation has been recorded at less than 0.1% of the time with all the episodes measured in seconds.   Allergies  Allergen Reactions  . Colestipol     Muscle aches  . Hydrocodone-Acetaminophen Itching    Tolerates tylenol  . Niacin And Related   . Statins     Muscle aches on all she has tried.  She recalls Lipitor, Crestor, and Livalo but thinks there were others  . Zetia [Ezetimibe]     Muscle aches    Current Outpatient Prescriptions  Medication Sig Dispense Refill  . aspirin  81 MG tablet Take 81 mg by mouth daily.      Marland Kitchen dexlansoprazole (DEXILANT) 60 MG capsule Take 60 mg by mouth daily.      . Fluticasone-Salmeterol (ADVAIR DISKUS) 100-50 MCG/DOSE AEPB Inhale 1 puff into the lungs every 12 (twelve) hours.  60 each  3  . levothyroxine (SYNTHROID, LEVOTHROID) 50 MCG tablet Take 50 mcg by  mouth daily before breakfast.      . LORazepam (ATIVAN) 1 MG tablet Take one tablet by mouth at bedtime as needed.  30 tablet  5  . losartan (COZAAR) 50 MG tablet Take 1 tablet (50 mg total) by mouth daily.  30 tablet  3  . metoprolol succinate (TOPROL-XL) 25 MG 24 hr tablet Take 1 tablet (25 mg total) by mouth daily.  90 tablet  3  . nitroGLYCERIN (NITROSTAT) 0.4 MG SL tablet Place 1 tablet (0.4 mg total) under the tongue every 5 (five) minutes x 3 doses as needed for chest pain.  25 tablet  12  . ondansetron (ZOFRAN) 4 MG tablet Take 4 mg by mouth every 6 (six) hours as needed for nausea or vomiting.      Marland Kitchen albuterol (PROVENTIL HFA;VENTOLIN HFA) 108 (90 BASE) MCG/ACT inhaler Inhale 2 puffs into the lungs every 4 (four) hours as needed for wheezing or shortness of breath.  1 Inhaler  1  . furosemide (LASIX) 20 MG tablet Take 2 tablets (40 mg total) by mouth daily as needed for edema (Take potassium supplement with this.).  60 tablet  3  . potassium chloride (K-DUR) 10 MEQ tablet Take 1 tablet (10 mEq total) by mouth daily.  30 tablet  6   No current facility-administered medications for this visit.    Past Medical History  Diagnosis Date  . Hypertension   . Hypercholesteremia   . Myocardial infarction     Inferior STEMI 07/2008 - PCI of prox RCA followed byu CABG x 3 in 9/'09  . Pneumonia   . Anxiety   . CAD in native artery     s/p CABG x 3; Last Myoview in 07/2009 - non-ischemic; Echo 03/2012  Aortic Sclerosis with normal EF.  Marland Kitchen Asthma   . Pacemaker   . GERD (gastroesophageal reflux disease)   . Arthritis   . Status post hip hemiarthroplasty left hip by Dr Ronnie Derby 08/08/2012  . PAF (paroxysmal atrial fibrillation)   . CKD (chronic kidney disease), stage III   . DVT (deep vein thrombosis) in pregnancy   . Esophageal perforation     9/13  . Mediastinitis     s/p drainage  . Hypothyroidism   . Shortness of breath     Past Surgical History  Procedure Laterality Date  . Coronary  artery bypass graft  2009    LIMA-LAD, SVG-RI, SVG-stented RCA  . Total hip arthroplasty    . Pacemaker insertion  01/06/2009    Medtronic  . Wrist fracture surgery  07/2012    left intra articular  with carpel tunnel  . Carpal tunnel release  08/08/2012    Procedure: CARPAL TUNNEL RELEASE;  Surgeon: Schuyler Amor, MD;  Location: [place];  Service: Orthopedics;  Laterality: Left;  . Hip arthroplasty  08/08/2012    Procedure: ARTHROPLASTY BIPOLAR HIP;  Surgeon: Rudean Haskell, MD;  Location: [place];  Service: Orthopedics;  Laterality: Left;  [place]   . Esophagogastroduodenoscopy  08/10/2012    Procedure: ESOPHAGOGASTRODUODENOSCOPY (EGD);  Surgeon: Beryle Beams, MD;  Location: [place];  Service:  Endoscopy;  Laterality: N/A;  . Esophagoscopy  08/10/2012    Procedure: ESOPHAGOSCOPY;  Surgeon: [name], MD;  Location: [place];  Service: ENT;  Laterality: N/A;  . Gastrostomy  08/16/2012    Procedure: GASTROSTOMY;  Surgeon: Zenovia Jarred, MD;  Location: [place];  Service: General;  Laterality: N/A;  . Joint replacement    . Nm myocar perf wall motion  08/14/2009    Normal    Family History  Problem Relation Age of Onset  . Hypertension Mother   . Diabetes Mother   . Cancer Mother   . Hypertension Father   . Stroke Father     History   Social History  . Marital Status: Widowed    Spouse Name: N/A    Number of Children: N/A  . Years of Education: N/A   Occupational History  . Not on file.   Social History Main Topics  . Smoking status: Never Smoker   . Smokeless tobacco: Never Used  . Alcohol Use: No  . Drug Use: No  . Sexual Activity: No   Other Topics Concern  . Not on file   Social History Narrative  . No narrative on file    Review of systems:  Complains of dizziness and lightheadedness, but no syncope. She has fatigue and lack of energy. She has worsening lower extremity edema. She describes more problems with reflux and burping especially worsened with  motion sickness. She has noticed wheezing but does not have cough, fever or chills  The patient specifically denies any chest pain at rest or with exertion, orthopnea, paroxysmal nocturnal dyspnea, syncope, palpitations, focal neurological deficits, intermittent claudication,\unexplained weight gain, cough, hemoptysis.  The patient also denies abdominal pain, nausea, vomiting, dysphagia, diarrhea, constipation, polyuria, polydipsia, dysuria, hematuria, frequency, urgency, abnormal bleeding or bruising, fever, mood swings, change in skin or hair texture, change in voice quality, auditory or visual problems, allergic reactions or rashes, new musculoskeletal complaints other than usual "aches and pains".   PHYSICAL EXAM BP 138/62  Pulse 72  Resp 16  Ht 5\' 5"  (1.651 m)  Wt 161 lb 8 oz (73.256 kg)  BMI 26.88 kg/m2 General: Alert, oriented x3, no distress  Head: no evidence of trauma, PERRL, EOMI, no exophtalmos or lid lag, no myxedema, no xanthelasma; normal ears, nose and oropharynx  Neck: normal jugular venous pulsations and no hepatojugular reflux; brisk carotid pulses without delay and no carotid bruits  Chest: Faint bilateral wheezing, no signs of consolidation by percussion or palpation, normal fremitus, symmetrical and full respiratory excursions; of the subclavian pacemaker site  Cardiovascular: normal position and quality of the apical impulse, regular rhythm, normal first and second heart sounds, no murmurs, rubs or gallops  Abdomen: no tenderness or distention, no masses by palpation, no abnormal pulsatility or arterial bruits, normal bowel sounds, no hepatosplenomegaly  Extremities: no clubbing, cyanosis or edema; 2+ radial, ulnar and brachial pulses bilaterally; 2+ right femoral, posterior tibial and dorsalis pedis pulses; 2+ left femoral, posterior tibial and dorsalis pedis pulses; no subclavian or femoral bruits  Neurological: grossly nonfocal   EKG:  Atrial paced, ventricular  sensed, anterior fascicular block  Lipid Panel     Component Value Date/Time   CHOL 293* 04/16/2014 0422   TRIG 178* 04/16/2014 0422   HDL 47 04/16/2014 0422   CHOLHDL 6.2 04/16/2014 0422   VLDL 36 04/16/2014 0422   LDLCALC 210* 04/16/2014 0422    BMET    Component Value Date/Time   NA 141 04/16/2014  0422   K 4.9 04/16/2014 0422   CL 101 04/16/2014 0422   CO2 28 04/16/2014 0422   GLUCOSE 98 04/16/2014 0422   BUN 19 04/16/2014 0422   CREATININE 1.19* 04/16/2014 0422   CREATININE 1.39* 04/16/2013 0732   CALCIUM 9.4 04/16/2014 0422   GFRNONAA 39* 04/16/2014 0422   GFRAA 45* 04/16/2014 0422     ASSESSMENT AND PLAN  Dyspnea I think there is a very good chance that her dyspnea is pulmonary in etiology, consideration was still short of breath despite having normal cardiac filling pressures. I have given her prescription for albuterol metered-dose inhaler if she should continue using Advair as a scheduled drug.  CAD, CABG X 3 10/09. Remote Stent RCA Currently free of angina. Preserved left ventricular systolic function. No evidence of congestive heart failure. Recent angiography shows 2 out of 3 bypass grafts occluded, but the only territory that is likely ischemic is the ramus intermedius. If she should develop angina this might be worth pursuing, but I do not think this relatively small area of ischemia can explain her dyspnea.  Pacemaker - dual chamber Medtronic Enrhythm, 2010 Normal device function. She is a good candidate for [facility] remote monitoring.   PAF/SSS  Years ago she had been on warfarin but interrogation of her pacemaker over the last for years has not shown atrial fibrillation she is currently on aspirin only for stroke prevention   Hyperlipidemia, statin intolerant  She has very severe hypercholesterolemia and unfortunately have not identified any regimen that she can tolerate. She developed severe aching in her thigh muscles with every lipid-lowering agent that has been tried.  This includes numerous statins but also Zetia and colestipol. Niacin was poorly tolerated even in low doses. We'll have to wait until PSCK9 meters become available   Hypertension  Probably excessive blood pressure control on the current regimen. Many of her recent complaints (lightheadedness, lower extremity edema, possibly even worsening gastroesophageal reflux) can be attributed to the amlodipine. We'll stop this medication. Can temporarily double up on her very low doses of loop diuretic for the edema. She now weighs roughly 7-1/2 pounds more than she did in January and 5 pounds more than at her last office appointment just a month ago.  Patient Instructions  Remote monitoring is used to monitor your pacemaker from home. This monitoring reduces the number of office visits required to check your device to one time per year. It allows [place] to keep an eye on the functioning of your device to ensure it is working properly. You are scheduled for a device check from home on 08-28-2014. You may send your transmission at any time that day. If you have a wireless device, the transmission will be sent automatically. After your physician reviews your transmission, you will receive a postcard with your next transmission date.  Your physician recommends that you schedule a follow-up appointment in: 6 months with Dr.Staci Carver  Med changes -  1. STOP amlodipine 2. Use albuterol inhaler 2 puffs every 4 hours for wheezing or shortness of breath 3. Use furosemide (lasix) 2 - 20mg  tablets daily as needed for edema ((take potassium 50mEq once daily with this)     Orders Placed This Encounter  Procedures  . Implantable device check   Meds ordered this encounter  Medications  . levothyroxine (SYNTHROID, LEVOTHROID) 50 MCG tablet    Sig: Take 50 mcg by mouth daily before breakfast.  . ondansetron (ZOFRAN) 4 MG tablet    Sig: Take 4 mg  by mouth every 6 (six) hours as needed for nausea or vomiting.  Marland Kitchen  dexlansoprazole (DEXILANT) 60 MG capsule    Sig: Take 60 mg by mouth daily.  . potassium chloride (K-DUR) 10 MEQ tablet    Sig: Take 1 tablet (10 mEq total) by mouth daily.    Dispense:  30 tablet    Refill:  6  . furosemide (LASIX) 20 MG tablet    Sig: Take 2 tablets (40 mg total) by mouth daily as needed for edema (Take potassium supplement with this.).    Dispense:  60 tablet    Refill:  3  . albuterol (PROVENTIL HFA;VENTOLIN HFA) 108 (90 BASE) MCG/ACT inhaler    Sig: Inhale 2 puffs into the lungs every 4 (four) hours as needed for wheezing or shortness of breath.    Dispense:  1 Inhaler    Refill:  Winside Dayden Viverette, MD, [facility] [phone] office [phone] pager

## 2014-06-09 ENCOUNTER — Ambulatory Visit (INDEPENDENT_AMBULATORY_CARE_PROVIDER_SITE_OTHER): Payer: Medicare Other | Admitting: Internal Medicine

## 2014-06-09 ENCOUNTER — Telehealth (INDEPENDENT_AMBULATORY_CARE_PROVIDER_SITE_OTHER): Payer: Self-pay | Admitting: Internal Medicine

## 2014-06-09 ENCOUNTER — Encounter (INDEPENDENT_AMBULATORY_CARE_PROVIDER_SITE_OTHER): Payer: Self-pay | Admitting: Internal Medicine

## 2014-06-09 VITALS — BP 102/50 | HR 68 | Temp 98.2°F | Ht 65.5 in | Wt 158.7 lb

## 2014-06-09 DIAGNOSIS — I2510 Atherosclerotic heart disease of native coronary artery without angina pectoris: Secondary | ICD-10-CM

## 2014-06-09 DIAGNOSIS — K219 Gastro-esophageal reflux disease without esophagitis: Secondary | ICD-10-CM | POA: Insufficient documentation

## 2014-06-09 MED ORDER — PANTOPRAZOLE SODIUM 40 MG PO TBEC: 40.0000 mg | DELAYED_RELEASE_TABLET | Freq: Every day | ORAL | Status: DC

## 2014-06-09 NOTE — Telephone Encounter (Signed)
Reviewed

## 2014-06-09 NOTE — Patient Instructions (Signed)
Protonix 40mg  BID. OV 1 month

## 2014-06-09 NOTE — Progress Notes (Signed)
Subjective:     Patient ID: Amy [name], female   DOB: Jun 29, 1922, 80 y.o.   MRN: [id number]  HPI Referred to our office by Dr. Hilma Favors for GERD. Hx of esophageal rupture.  Felt the small esophageal perforation was related to traumatic injury that was related to intubation at time of her initial orthopedic surgery in October 9. Closed fracture of left distal radius s/p ORIF by Dr Burney Gauze 08/08/2012  Odynophagia due to new traumatic peri-op esophageal perforation  Mediastinitis due to esophageal perforation-s/p Incision and drainage, right neck and mediastinum, transcervical approach.   She ended up with a stomach tube for about 60 days.  Saw DR. Hilma Favors in June. She had been taking Prilosec. Started on samples of Dexilant in June.   She has had 3 episodes where acid has come up in her esophagus since starting the Dexilant.  There is no abdominal pain.   She says she has gas forming and she will burp it out (which she describes as a poof of air). Does not want to undergo another EGD. Her appetite is good. She says there has been no weight loss. BMs are normal. Usually has a BM daily. Sometimes she will drink prune juice if she thinks she is becoming constipated.   08/10/2012 EGD Dr. Carol Ada: Mucosal defect in cervical esophagus. ? Large ulceration vs perforation.   08/10/2012 CT soft tissue of the neck: Esophageal perforation: 1. Esophageal perforation. The perforation appears to be at the  level of the cricoid cartilage  2. Extensive retropharyngeal gas with perforation posterior to the  carotid space on the right and surrounding the thyroid gland and  larynx.  3. Medial deviation of the right internal carotid artery within  the retropharyngeal space is surrounded by gas.  4. Atherosclerosis.  5. Moderate spondylosis of the cervical spine.  08/20/2012 DG Esophogram:  IMPRESSION:  1. Study is positive for persistent esophageal perforation, as  above.    Review of Systems Past  Medical History  Diagnosis Date  . Hypertension   . Hypercholesteremia   . Myocardial infarction     Inferior STEMI 07/2008 - PCI of prox RCA followed byu CABG x 3 in 9/'09  . Pneumonia   . Anxiety   . CAD in native artery     s/p CABG x 3; Last Myoview in 07/2009 - non-ischemic; Echo 03/2012  Aortic Sclerosis with normal EF.  Marland Kitchen Asthma   . Pacemaker   . GERD (gastroesophageal reflux disease)   . Arthritis   . Status post hip hemiarthroplasty left hip by Dr Ronnie Derby 08/08/2012  . PAF (paroxysmal atrial fibrillation)   . CKD (chronic kidney disease), stage III   . DVT (deep vein thrombosis) in pregnancy   . Esophageal perforation     9/13  . Mediastinitis     s/p drainage  . Hypothyroidism   . Shortness of breath     Past Surgical History  Procedure Laterality Date  . Coronary artery bypass graft  2009    LIMA-LAD, SVG-RI, SVG-stented RCA  . Total hip arthroplasty    . Pacemaker insertion  01/06/2009    Medtronic  . Wrist fracture surgery  07/2012    left intra articular  with carpel tunnel  . Carpal tunnel release  08/08/2012    Procedure: CARPAL TUNNEL RELEASE;  Surgeon: Schuyler Amor, MD;  Location: [place];  Service: Orthopedics;  Laterality: Left;  . Hip arthroplasty  08/08/2012    Procedure: ARTHROPLASTY BIPOLAR HIP;  Surgeon:  Rudean Haskell, MD;  Location: [place];  Service: Orthopedics;  Laterality: Left;  [place]   . Esophagogastroduodenoscopy  08/10/2012    Procedure: ESOPHAGOGASTRODUODENOSCOPY (EGD);  Surgeon: Beryle Beams, MD;  Location: [facility];  Service: Endoscopy;  Laterality: N/A;  . Esophagoscopy  08/10/2012    Procedure: ESOPHAGOSCOPY;  Surgeon: [name], MD;  Location: [place];  Service: ENT;  Laterality: N/A;  . Gastrostomy  08/16/2012    Procedure: GASTROSTOMY;  Surgeon: Zenovia Jarred, MD;  Location: [place];  Service: General;  Laterality: N/A;  . Joint replacement    . Nm myocar perf wall motion  08/14/2009    Normal    Allergies  Allergen  Reactions  . Colestipol     Muscle aches  . Hydrocodone-Acetaminophen Itching    Tolerates tylenol  . Niacin And Related   . Statins     Muscle aches on all she has tried.  She recalls Lipitor, Crestor, and Livalo but thinks there were others  . Zetia [Ezetimibe]     Muscle aches    Current Outpatient Prescriptions on File Prior to Visit  Medication Sig Dispense Refill  . albuterol (PROVENTIL HFA;VENTOLIN HFA) 108 (90 BASE) MCG/ACT inhaler Inhale 2 puffs into the lungs every 4 (four) hours as needed for wheezing or shortness of breath.  1 Inhaler  1  . aspirin 81 MG tablet Take 81 mg by mouth daily.      Marland Kitchen dexlansoprazole (DEXILANT) 60 MG capsule Take 60 mg by mouth daily.      . Fluticasone-Salmeterol (ADVAIR DISKUS) 100-50 MCG/DOSE AEPB Inhale 1 puff into the lungs every 12 (twelve) hours.  60 each  3  . furosemide (LASIX) 20 MG tablet Take 2 tablets (40 mg total) by mouth daily as needed for edema (Take potassium supplement with this.).  60 tablet  3  . levothyroxine (SYNTHROID, LEVOTHROID) 50 MCG tablet Take 50 mcg by mouth daily before breakfast.      . LORazepam (ATIVAN) 1 MG tablet Take one tablet by mouth at bedtime as needed.  30 tablet  5  . losartan (COZAAR) 50 MG tablet Take 1 tablet (50 mg total) by mouth daily.  30 tablet  3  . metoprolol succinate (TOPROL-XL) 25 MG 24 hr tablet Take 1 tablet (25 mg total) by mouth daily.  90 tablet  3  . nitroGLYCERIN (NITROSTAT) 0.4 MG SL tablet Place 1 tablet (0.4 mg total) under the tongue every 5 (five) minutes x 3 doses as needed for chest pain.  25 tablet  12  . ondansetron (ZOFRAN) 4 MG tablet Take 4 mg by mouth every 6 (six) hours as needed for nausea or vomiting.      . potassium chloride (K-DUR) 10 MEQ tablet Take 1 tablet (10 mEq total) by mouth daily.  30 tablet  6   No current facility-administered medications on file prior to visit.        Objective:   Physical Exam  Filed Vitals:   06/09/14 1601  BP: 102/50  Pulse:  68  Temp: 98.2 F (36.8 C)  Height: 5' 5.5" (1.664 m)  Weight: 158 lb 11.2 oz (71.986 kg)   Alert and oriented. Skin warm and dry. Oral mucosa is moist.   . Sclera anicteric, conjunctivae is pink. Thyroid not enlarged. No cervical lymphadenopathy. Lungs clear. Heart regular rate and rhythm.  Abdomen is soft. Bowel sounds are positive. No hepatomegaly. No abdominal masses felt. No tenderness.  No edema to lower extremities.  Assessment:     GERD, not controlled at this time. Has been on Dexilant . She has had breakthru GERD x 3 since taking the  Dexilant.  Patient really does not want a repeat an EGD.    Plan:    Am going to try her on Protonix 40mg   BID x 1 month.   Will back off to Protonix 40mg  once a day. OV in 1 month. Will back off on Protonix to once a day.

## 2014-06-18 DIAGNOSIS — E039 Hypothyroidism, unspecified: Secondary | ICD-10-CM | POA: Diagnosis not present

## 2014-07-02 DIAGNOSIS — Z85828 Personal history of other malignant neoplasm of skin: Secondary | ICD-10-CM | POA: Diagnosis not present

## 2014-07-02 DIAGNOSIS — L570 Actinic keratosis: Secondary | ICD-10-CM | POA: Diagnosis not present

## 2014-07-02 DIAGNOSIS — C44621 Squamous cell carcinoma of skin of unspecified upper limb, including shoulder: Secondary | ICD-10-CM | POA: Diagnosis not present

## 2014-07-02 DIAGNOSIS — D485 Neoplasm of uncertain behavior of skin: Secondary | ICD-10-CM | POA: Diagnosis not present

## 2014-07-08 ENCOUNTER — Telehealth: Payer: Self-pay | Admitting: Cardiovascular Disease

## 2014-07-08 DIAGNOSIS — I10 Essential (primary) hypertension: Secondary | ICD-10-CM

## 2014-07-08 NOTE — Telephone Encounter (Signed)
Calling because she had a medication change ,..she is on Losartan 100mg  and Metoprolol 25mg  and her losartan was cut in half. Was in the hospital  and the medication that they plced her on, Dr. Sallyanne Kuster took her off  , not sure of the name of the medication , but she is having some problems and would like to speak to a nurse. Please Call   Thanks

## 2014-07-08 NOTE — Telephone Encounter (Signed)
Please ask her to increase furosemide to 80 mg daily, repeat BMET this week and call us back no later than Friday with her updated weight and clinical status.

## 2014-07-08 NOTE — Telephone Encounter (Signed)
Returned call to patient spoke to daughter Juliann Pulse.She stated at last office visit with Dr.Croitoru he stopped amlodipine.Stated mother continues to have swelling in both ankles.Stated her weight was 151 lbs now 160 lbs.Stated she is sob but no worse.Stated she only takes furosemide 20 mg 2 tablets daily if weight greater than 155 lbs.Stated mother is having cramping in both lower legs at night.Also has noticed a sunken area on right lower leg bout a inch long,no pain.Message sent to Dr.Croitoru for advice.

## 2014-07-08 NOTE — Telephone Encounter (Signed)
Return call to patient Dr.Croitoru advised to increase furosemide to 80 mg daily.Repeat bmet in 1 week.Advised to call back Friday 07/11/14 to report weight and clinical status.

## 2014-07-09 ENCOUNTER — Telehealth: Payer: Self-pay | Admitting: Cardiovascular Disease

## 2014-07-09 NOTE — Telephone Encounter (Signed)
Pt was told yesterday to increase her fluid medicine,should she increase her potassium medicine also?

## 2014-07-09 NOTE — Telephone Encounter (Signed)
Dr. Loletha Grayer will review upcoming lab results to determine if the K+ needs to be adjusted.  Voiced understanding.

## 2014-07-10 ENCOUNTER — Encounter (INDEPENDENT_AMBULATORY_CARE_PROVIDER_SITE_OTHER): Payer: Self-pay | Admitting: Internal Medicine

## 2014-07-10 ENCOUNTER — Ambulatory Visit (INDEPENDENT_AMBULATORY_CARE_PROVIDER_SITE_OTHER): Payer: Medicare Other | Admitting: Internal Medicine

## 2014-07-10 VITALS — BP 110/42 | Temp 98.0°F | Ht 65.5 in | Wt 159.0 lb

## 2014-07-10 DIAGNOSIS — K219 Gastro-esophageal reflux disease without esophagitis: Secondary | ICD-10-CM

## 2014-07-10 DIAGNOSIS — I2510 Atherosclerotic heart disease of native coronary artery without angina pectoris: Secondary | ICD-10-CM

## 2014-07-10 NOTE — Progress Notes (Signed)
Subjective:    Patient ID: Amy [name], female    DOB: [birth year], 80 y.o.   MRN: [id number]  HPI here today for f/u. She tells me her acid reflux is better. She tells me since starting the Protonix, sometimes she feels the acid in her esophagus. She has acid reflux maybe once every week. She knows what foods to avoid. Her appetite is good. She has gained fluid and her lasix was increased to 80mg . She says some night she will get up and eat an ice cream sandwich which does not bother her.  Her BMs are good.  No melena or BRRB.    Hx of esophageal rupture. Felt the small esophageal perforation was related to traumatic injury that was related to intubation at time of her initial orthopedic surgery in October 9 , 2013. Closed fracture of left distal radius s/p ORIF by Dr Burney Gauze 08/08/2012  Odynophagia due to new traumatic peri-op esophageal perforation  Mediastinitis due to esophageal perforation-s/p Incision and drainage, right neck and mediastinum, transcervical approach.  She ended up with a stomach tube for about 60 days.  1. Esophageal perforation. The perforation appears to be at the  level of the cricoid cartilage  2. Extensive retropharyngeal gas with perforation posterior to the  carotid space on the right and surrounding the thyroid gland and  larynx.  3. Medial deviation of the right internal carotid artery within  the retropharyngeal space is surrounded by gas.  4. Atherosclerosis.  5. Moderate spondylosis of the cervical spine.  08/20/2012 DG Esophogram:  IMPRESSION:  1. Study is positive for persistent esophageal perforation, as  above.     Review of Systems   Past Medical History  Diagnosis Date  . Hypertension   . Hypercholesteremia   . Myocardial infarction     Inferior STEMI 07/2008 - PCI of prox RCA followed byu CABG x 3 in 9/'09  . Pneumonia   . Anxiety   . CAD in native artery     s/p CABG x 3; Last Myoview in 07/2009 - non-ischemic; Echo 03/2012  Aortic  Sclerosis with normal EF.  Marland Kitchen Asthma   . Pacemaker   . GERD (gastroesophageal reflux disease)   . Arthritis   . Status post hip hemiarthroplasty left hip by Dr Ronnie Derby 08/08/2012  . PAF (paroxysmal atrial fibrillation)   . CKD (chronic kidney disease), stage III   . DVT (deep vein thrombosis) in pregnancy   . Esophageal perforation     9/13  . Mediastinitis     s/p drainage  . Hypothyroidism   . Shortness of breath     Past Surgical History  Procedure Laterality Date  . Coronary artery bypass graft  2009    LIMA-LAD, SVG-RI, SVG-stented RCA  . Total hip arthroplasty    . Pacemaker insertion  01/06/2009    [employer]  . Wrist fracture surgery  07/2012    left intra articular  with carpel tunnel  . Carpal tunnel release  08/08/2012    Procedure: CARPAL TUNNEL RELEASE;  Surgeon: Schuyler Amor, MD;  Location: [place];  Service: Orthopedics;  Laterality: Left;  . Hip arthroplasty  08/08/2012    Procedure: ARTHROPLASTY BIPOLAR HIP;  Surgeon: Rudean Haskell, MD;  Location: [place];  Service: Orthopedics;  Laterality: Left;  [place]   . Esophagogastroduodenoscopy  08/10/2012    Procedure: ESOPHAGOGASTRODUODENOSCOPY (EGD);  Surgeon: Beryle Beams, MD;  Location[facility] ENDOSCOPY;  Service: Endoscopy;  Laterality: N/A;  . Esophagoscopy  08/10/2012  Procedure: ESOPHAGOSCOPY;  Surgeon: [name], MD;  Location: [place];  Service: ENT;  Laterality: N/A;  . Gastrostomy  08/16/2012    Procedure: GASTROSTOMY;  Surgeon: Zenovia Jarred, MD;  Location: [place];  Service: General;  Laterality: N/A;  . Joint replacement    . Nm myocar perf wall motion  08/14/2009    Normal    Allergies  Allergen Reactions  . Colestipol     Muscle aches  . Hydrocodone-Acetaminophen Itching    Tolerates tylenol  . Niacin And Related   . Statins     Muscle aches on all she has tried.  She recalls Lipitor, Crestor, and Livalo but thinks there were others  . Zetia [Ezetimibe]     Muscle aches    Current  Outpatient Prescriptions on File Prior to Visit  Medication Sig Dispense Refill  . albuterol (PROVENTIL HFA;VENTOLIN HFA) 108 (90 BASE) MCG/ACT inhaler Inhale 2 puffs into the lungs every 4 (four) hours as needed for wheezing or shortness of breath.  1 Inhaler  1  . aspirin 81 MG tablet Take 81 mg by mouth daily.      . furosemide (LASIX) 40 MG tablet Take 80 mg daily  60 tablet  6  . levothyroxine (SYNTHROID, LEVOTHROID) 50 MCG tablet Take 75 mcg by mouth daily before breakfast.       . LORazepam (ATIVAN) 1 MG tablet Take one tablet by mouth at bedtime as needed.  30 tablet  5  . losartan (COZAAR) 50 MG tablet Take 1 tablet (50 mg total) by mouth daily.  30 tablet  3  . metoprolol succinate (TOPROL-XL) 25 MG 24 hr tablet Take 1 tablet (25 mg total) by mouth daily.  90 tablet  3  . nitroGLYCERIN (NITROSTAT) 0.4 MG SL tablet Place 1 tablet (0.4 mg total) under the tongue every 5 (five) minutes x 3 doses as needed for chest pain.  25 tablet  12  . ondansetron (ZOFRAN) 4 MG tablet Take 4 mg by mouth every 6 (six) hours as needed for nausea or vomiting.      . pantoprazole (PROTONIX) 40 MG tablet Take 40 mg by mouth daily.      . potassium chloride (K-DUR) 10 MEQ tablet Take 1 tablet (10 mEq total) by mouth daily.  30 tablet  6   No current facility-administered medications on file prior to visit.      Review of Systems     Objective:   Physical Exam  Filed Vitals:   07/10/14 1441  BP: 110/42  Temp: 98 F (36.7 C)  Height: 5' 5.5" (1.664 m)  Weight: 159 lb (72.122 kg)   Alert and oriented. Skin warm and dry. Oral mucosa is moist.   . Sclera anicteric, conjunctivae is pink. Thyroid not enlarged. No cervical lymphadenopathy. Lungs clear. Heart regular rate and rhythm.  Abdomen is soft. Bowel sounds are positive. No hepatomegaly. No abdominal masses felt. No tenderness.  No edema to lower extremities.          Assessment & Plan:   Continue the Protonix. Occasional acid reflux may take  a Tums. OV in 3 months.

## 2014-07-10 NOTE — Patient Instructions (Signed)
OV in 3 months. Continue the Protonix.

## 2014-07-11 NOTE — Telephone Encounter (Signed)
Route to [place]

## 2014-07-11 NOTE — Telephone Encounter (Signed)
Juliann Pulse called in wanting inform Dr.C's nurse on her progress. For Weds. Morning BP-124/68, pulse-74, weight-154. Wed evening BP-110/63,pulse-73,weight-157. Thurs morning BP-128/75,pulse-75, weight-154. Thurs night BP-107/61,pulse 78,weight-157. This morning BP-126/76, pulse 86,weight 155. Please call  Thanks

## 2014-07-11 NOTE — Telephone Encounter (Signed)
Called patient and daughter. States she feel OK can't really put her finger on it as to why she doesn't feel better.  Will continue current meds and continue monitoring weights and vitals.  Will send report to Dr. Loletha Grayer for review and advise.  Understood Dr. Loletha Grayer is out of the office until Monday.

## 2014-07-14 ENCOUNTER — Encounter: Payer: Self-pay | Admitting: Cardiology

## 2014-07-15 ENCOUNTER — Other Ambulatory Visit: Payer: Self-pay | Admitting: *Deleted

## 2014-07-15 ENCOUNTER — Telehealth: Payer: Self-pay | Admitting: Cardiovascular Disease

## 2014-07-15 DIAGNOSIS — I10 Essential (primary) hypertension: Secondary | ICD-10-CM

## 2014-07-15 LAB — BASIC METABOLIC PANEL
BUN: 40 mg/dL — ABNORMAL HIGH (ref 6–23)
CHLORIDE: 95 meq/L — AB (ref 96–112)
CO2: 31 mEq/L (ref 19–32)
Calcium: 9.9 mg/dL (ref 8.4–10.5)
Creat: 1.86 mg/dL — ABNORMAL HIGH (ref 0.50–1.10)
Glucose, Bld: 101 mg/dL — ABNORMAL HIGH (ref 70–99)
Potassium: 4.2 mEq/L (ref 3.5–5.3)
Sodium: 136 mEq/L (ref 135–145)

## 2014-07-15 NOTE — Telephone Encounter (Signed)
Will route info to Dr. Loletha Grayer again for review and advise.

## 2014-07-15 NOTE — Telephone Encounter (Signed)
[facility] called to inform us the patient is there to get blood drawn and there's not a order in the computer. Informed them the order is for a B-MET. I will release the order into the system for their review.

## 2014-07-17 ENCOUNTER — Telehealth: Payer: Self-pay | Admitting: *Deleted

## 2014-07-17 DIAGNOSIS — Z79899 Other long term (current) drug therapy: Secondary | ICD-10-CM

## 2014-07-17 DIAGNOSIS — C44621 Squamous cell carcinoma of skin of unspecified upper limb, including shoulder: Secondary | ICD-10-CM | POA: Diagnosis not present

## 2014-07-17 NOTE — Telephone Encounter (Signed)
Message copied by Tressa Busman on Thu Jul 17, 2014 11:33 AM ------      Message from: Sanda Klein      Created: Wed Jul 16, 2014  5:41 PM       Labs suggest she may be feeling bad since she is dehydrated. Please make sure she is not taking furosemide (had it as prn med) and encourage fluid intake. Recheck labs in 7-10 days ------

## 2014-07-17 NOTE — Telephone Encounter (Signed)
Daughter states she hasn't taken Furosemide in 2 days because she had to go out of the house.  She has gained a few pounds over the last several days.  Instructed to call if weight gain continues.  Will decrease salt intake.  Lab to be rechecked next week 07/23/14.  Voiced understanding.

## 2014-07-17 NOTE — Telephone Encounter (Signed)
Returning your call. °

## 2014-07-21 ENCOUNTER — Emergency Department (HOSPITAL_COMMUNITY): Payer: Medicare Other

## 2014-07-21 ENCOUNTER — Encounter (HOSPITAL_COMMUNITY): Payer: Self-pay | Admitting: Emergency Medicine

## 2014-07-21 ENCOUNTER — Inpatient Hospital Stay (HOSPITAL_COMMUNITY)
Admission: EM | Admit: 2014-07-21 | Discharge: 2014-07-25 | DRG: 563 | Disposition: A | Discharge status: Skilled Nursing Facility | Payer: Medicare Other | Attending: Internal Medicine | Admitting: Internal Medicine

## 2014-07-21 DIAGNOSIS — M542 Cervicalgia: Secondary | ICD-10-CM | POA: Diagnosis not present

## 2014-07-21 DIAGNOSIS — S72009A Fracture of unspecified part of neck of unspecified femur, initial encounter for closed fracture: Secondary | ICD-10-CM

## 2014-07-21 DIAGNOSIS — S4980XA Other specified injuries of shoulder and upper arm, unspecified arm, initial encounter: Secondary | ICD-10-CM | POA: Diagnosis not present

## 2014-07-21 DIAGNOSIS — M25559 Pain in unspecified hip: Secondary | ICD-10-CM | POA: Diagnosis not present

## 2014-07-21 DIAGNOSIS — Z885 Allergy status to narcotic agent status: Secondary | ICD-10-CM

## 2014-07-21 DIAGNOSIS — S79919A Unspecified injury of unspecified hip, initial encounter: Secondary | ICD-10-CM | POA: Diagnosis not present

## 2014-07-21 DIAGNOSIS — K219 Gastro-esophageal reflux disease without esophagitis: Secondary | ICD-10-CM | POA: Diagnosis present

## 2014-07-21 DIAGNOSIS — I359 Nonrheumatic aortic valve disorder, unspecified: Secondary | ICD-10-CM | POA: Diagnosis present

## 2014-07-21 DIAGNOSIS — S32509A Unspecified fracture of unspecified pubis, initial encounter for closed fracture: Secondary | ICD-10-CM | POA: Diagnosis present

## 2014-07-21 DIAGNOSIS — F411 Generalized anxiety disorder: Secondary | ICD-10-CM | POA: Diagnosis present

## 2014-07-21 DIAGNOSIS — Z888 Allergy status to other drugs, medicaments and biological substances status: Secondary | ICD-10-CM

## 2014-07-21 DIAGNOSIS — S3282XA Multiple fractures of pelvis without disruption of pelvic ring, initial encounter for closed fracture: Secondary | ICD-10-CM | POA: Diagnosis present

## 2014-07-21 DIAGNOSIS — S199XXA Unspecified injury of neck, initial encounter: Secondary | ICD-10-CM | POA: Diagnosis not present

## 2014-07-21 DIAGNOSIS — S32810D Multiple fractures of pelvis with stable disruption of pelvic ring, subsequent encounter for fracture with routine healing: Secondary | ICD-10-CM

## 2014-07-21 DIAGNOSIS — E785 Hyperlipidemia, unspecified: Secondary | ICD-10-CM

## 2014-07-21 DIAGNOSIS — Z9181 History of falling: Secondary | ICD-10-CM | POA: Diagnosis not present

## 2014-07-21 DIAGNOSIS — S42202A Unspecified fracture of upper end of left humerus, initial encounter for closed fracture: Secondary | ICD-10-CM

## 2014-07-21 DIAGNOSIS — S8990XA Unspecified injury of unspecified lower leg, initial encounter: Secondary | ICD-10-CM | POA: Diagnosis not present

## 2014-07-21 DIAGNOSIS — N183 Chronic kidney disease, stage 3 unspecified: Secondary | ICD-10-CM

## 2014-07-21 DIAGNOSIS — I5032 Chronic diastolic (congestive) heart failure: Secondary | ICD-10-CM | POA: Diagnosis present

## 2014-07-21 DIAGNOSIS — Z23 Encounter for immunization: Secondary | ICD-10-CM

## 2014-07-21 DIAGNOSIS — G40909 Epilepsy, unspecified, not intractable, without status epilepticus: Secondary | ICD-10-CM | POA: Diagnosis not present

## 2014-07-21 DIAGNOSIS — I509 Heart failure, unspecified: Secondary | ICD-10-CM | POA: Diagnosis present

## 2014-07-21 DIAGNOSIS — S298XXA Other specified injuries of thorax, initial encounter: Secondary | ICD-10-CM | POA: Diagnosis not present

## 2014-07-21 DIAGNOSIS — I129 Hypertensive chronic kidney disease with stage 1 through stage 4 chronic kidney disease, or unspecified chronic kidney disease: Secondary | ICD-10-CM | POA: Diagnosis present

## 2014-07-21 DIAGNOSIS — S42292A Other displaced fracture of upper end of left humerus, initial encounter for closed fracture: Secondary | ICD-10-CM | POA: Diagnosis present

## 2014-07-21 DIAGNOSIS — R1310 Dysphagia, unspecified: Secondary | ICD-10-CM | POA: Diagnosis not present

## 2014-07-21 DIAGNOSIS — S3210XA Unspecified fracture of sacrum, initial encounter for closed fracture: Secondary | ICD-10-CM | POA: Diagnosis present

## 2014-07-21 DIAGNOSIS — I252 Old myocardial infarction: Secondary | ICD-10-CM | POA: Diagnosis not present

## 2014-07-21 DIAGNOSIS — W010XXA Fall on same level from slipping, tripping and stumbling without subsequent striking against object, initial encounter: Secondary | ICD-10-CM | POA: Diagnosis present

## 2014-07-21 DIAGNOSIS — N390 Urinary tract infection, site not specified: Secondary | ICD-10-CM | POA: Diagnosis not present

## 2014-07-21 DIAGNOSIS — Z7982 Long term (current) use of aspirin: Secondary | ICD-10-CM | POA: Diagnosis not present

## 2014-07-21 DIAGNOSIS — E7800 Pure hypercholesterolemia, unspecified: Secondary | ICD-10-CM | POA: Diagnosis present

## 2014-07-21 DIAGNOSIS — Z823 Family history of stroke: Secondary | ICD-10-CM

## 2014-07-21 DIAGNOSIS — E039 Hypothyroidism, unspecified: Secondary | ICD-10-CM | POA: Diagnosis present

## 2014-07-21 DIAGNOSIS — M25519 Pain in unspecified shoulder: Secondary | ICD-10-CM | POA: Diagnosis not present

## 2014-07-21 DIAGNOSIS — M545 Low back pain, unspecified: Secondary | ICD-10-CM | POA: Diagnosis not present

## 2014-07-21 DIAGNOSIS — I2510 Atherosclerotic heart disease of native coronary artery without angina pectoris: Secondary | ICD-10-CM | POA: Diagnosis present

## 2014-07-21 DIAGNOSIS — M79609 Pain in unspecified limb: Secondary | ICD-10-CM | POA: Diagnosis not present

## 2014-07-21 DIAGNOSIS — M810 Age-related osteoporosis without current pathological fracture: Secondary | ICD-10-CM | POA: Diagnosis not present

## 2014-07-21 DIAGNOSIS — S42213A Unspecified displaced fracture of surgical neck of unspecified humerus, initial encounter for closed fracture: Secondary | ICD-10-CM | POA: Diagnosis present

## 2014-07-21 DIAGNOSIS — R5381 Other malaise: Secondary | ICD-10-CM | POA: Diagnosis not present

## 2014-07-21 DIAGNOSIS — I4891 Unspecified atrial fibrillation: Secondary | ICD-10-CM | POA: Diagnosis present

## 2014-07-21 DIAGNOSIS — Z951 Presence of aortocoronary bypass graft: Secondary | ICD-10-CM | POA: Diagnosis not present

## 2014-07-21 DIAGNOSIS — I10 Essential (primary) hypertension: Secondary | ICD-10-CM

## 2014-07-21 DIAGNOSIS — S322XXA Fracture of coccyx, initial encounter for closed fracture: Secondary | ICD-10-CM | POA: Diagnosis present

## 2014-07-21 DIAGNOSIS — N179 Acute kidney failure, unspecified: Secondary | ICD-10-CM | POA: Diagnosis not present

## 2014-07-21 DIAGNOSIS — IMO0002 Reserved for concepts with insufficient information to code with codable children: Secondary | ICD-10-CM

## 2014-07-21 DIAGNOSIS — Z5189 Encounter for other specified aftercare: Secondary | ICD-10-CM | POA: Diagnosis not present

## 2014-07-21 DIAGNOSIS — R262 Difficulty in walking, not elsewhere classified: Secondary | ICD-10-CM | POA: Diagnosis not present

## 2014-07-21 DIAGNOSIS — Z96649 Presence of unspecified artificial hip joint: Secondary | ICD-10-CM | POA: Diagnosis not present

## 2014-07-21 DIAGNOSIS — S99929A Unspecified injury of unspecified foot, initial encounter: Secondary | ICD-10-CM | POA: Diagnosis not present

## 2014-07-21 DIAGNOSIS — W19XXXA Unspecified fall, initial encounter: Secondary | ICD-10-CM

## 2014-07-21 DIAGNOSIS — S0990XA Unspecified injury of head, initial encounter: Secondary | ICD-10-CM | POA: Diagnosis not present

## 2014-07-21 DIAGNOSIS — D509 Iron deficiency anemia, unspecified: Secondary | ICD-10-CM

## 2014-07-21 DIAGNOSIS — R509 Fever, unspecified: Secondary | ICD-10-CM | POA: Diagnosis not present

## 2014-07-21 DIAGNOSIS — I959 Hypotension, unspecified: Secondary | ICD-10-CM | POA: Diagnosis present

## 2014-07-21 DIAGNOSIS — J45909 Unspecified asthma, uncomplicated: Secondary | ICD-10-CM | POA: Diagnosis present

## 2014-07-21 DIAGNOSIS — S99919A Unspecified injury of unspecified ankle, initial encounter: Secondary | ICD-10-CM | POA: Diagnosis not present

## 2014-07-21 DIAGNOSIS — S32810A Multiple fractures of pelvis with stable disruption of pelvic ring, initial encounter for closed fracture: Secondary | ICD-10-CM

## 2014-07-21 DIAGNOSIS — R51 Headache: Secondary | ICD-10-CM | POA: Diagnosis not present

## 2014-07-21 DIAGNOSIS — S46909A Unspecified injury of unspecified muscle, fascia and tendon at shoulder and upper arm level, unspecified arm, initial encounter: Secondary | ICD-10-CM | POA: Diagnosis not present

## 2014-07-21 DIAGNOSIS — IMO0001 Reserved for inherently not codable concepts without codable children: Secondary | ICD-10-CM | POA: Diagnosis not present

## 2014-07-21 DIAGNOSIS — S42209A Unspecified fracture of upper end of unspecified humerus, initial encounter for closed fracture: Secondary | ICD-10-CM

## 2014-07-21 DIAGNOSIS — M6281 Muscle weakness (generalized): Secondary | ICD-10-CM | POA: Diagnosis not present

## 2014-07-21 DIAGNOSIS — Z950 Presence of cardiac pacemaker: Secondary | ICD-10-CM | POA: Diagnosis not present

## 2014-07-21 DIAGNOSIS — S3289XA Fracture of other parts of pelvis, initial encounter for closed fracture: Secondary | ICD-10-CM | POA: Diagnosis not present

## 2014-07-21 DIAGNOSIS — R404 Transient alteration of awareness: Secondary | ICD-10-CM | POA: Diagnosis not present

## 2014-07-21 DIAGNOSIS — S42309D Unspecified fracture of shaft of humerus, unspecified arm, subsequent encounter for fracture with routine healing: Secondary | ICD-10-CM | POA: Diagnosis not present

## 2014-07-21 DIAGNOSIS — S42293A Other displaced fracture of upper end of unspecified humerus, initial encounter for closed fracture: Secondary | ICD-10-CM | POA: Diagnosis not present

## 2014-07-21 DIAGNOSIS — S0993XA Unspecified injury of face, initial encounter: Secondary | ICD-10-CM | POA: Diagnosis not present

## 2014-07-21 DIAGNOSIS — S79929A Unspecified injury of unspecified thigh, initial encounter: Secondary | ICD-10-CM | POA: Diagnosis not present

## 2014-07-21 HISTORY — DX: Unspecified fall, initial encounter: W19.XXXA

## 2014-07-21 HISTORY — DX: Fracture of unspecified part of neck of unspecified femur, initial encounter for closed fracture: S72.009A

## 2014-07-21 HISTORY — DX: Other displaced fracture of upper end of unspecified humerus, initial encounter for closed fracture: S42.293A

## 2014-07-21 LAB — CBC WITH DIFFERENTIAL/PLATELET
Basophils Absolute: 0 10*3/uL (ref 0.0–0.1)
Basophils Relative: 0 % (ref 0–1)
EOS ABS: 0.1 10*3/uL (ref 0.0–0.7)
Eosinophils Relative: 1 % (ref 0–5)
HCT: 38.2 % (ref 36.0–46.0)
HEMOGLOBIN: 12.6 g/dL (ref 12.0–15.0)
Lymphocytes Relative: 10 % — ABNORMAL LOW (ref 12–46)
Lymphs Abs: 1.7 10*3/uL (ref 0.7–4.0)
MCH: 29.4 pg (ref 26.0–34.0)
MCHC: 33 g/dL (ref 30.0–36.0)
MCV: 89.3 fL (ref 78.0–100.0)
MONO ABS: 0.8 10*3/uL (ref 0.1–1.0)
MONOS PCT: 5 % (ref 3–12)
NEUTROS PCT: 84 % — AB (ref 43–77)
Neutro Abs: 14.6 10*3/uL — ABNORMAL HIGH (ref 1.7–7.7)
Platelets: 187 10*3/uL (ref 150–400)
RBC: 4.28 MIL/uL (ref 3.87–5.11)
RDW: 13.5 % (ref 11.5–15.5)
WBC: 17.3 10*3/uL — ABNORMAL HIGH (ref 4.0–10.5)

## 2014-07-21 LAB — BASIC METABOLIC PANEL
ANION GAP: 15 (ref 5–15)
BUN: 18 mg/dL (ref 6–23)
CO2: 24 mEq/L (ref 19–32)
CREATININE: 1.58 mg/dL — AB (ref 0.50–1.10)
Calcium: 9.6 mg/dL (ref 8.4–10.5)
Chloride: 99 mEq/L (ref 96–112)
GFR calc Af Amer: 32 mL/min — ABNORMAL LOW (ref >90)
GFR, EST NON AFRICAN AMERICAN: 28 mL/min — AB (ref >90)
Glucose, Bld: 122 mg/dL — ABNORMAL HIGH (ref 70–99)
POTASSIUM: 4.2 meq/L (ref 3.7–5.3)
Sodium: 138 mEq/L (ref 137–147)

## 2014-07-21 LAB — TROPONIN I: Troponin I: <0.3 ng/mL (ref <0.30)

## 2014-07-21 MED ORDER — SODIUM CHLORIDE 0.9 % IV SOLN
INTRAVENOUS | Status: DC
  Administered 2014-07-22 – 2014-07-23 (×3): via INTRAVENOUS

## 2014-07-21 MED ORDER — FENTANYL CITRATE 0.05 MG/ML IJ SOLN
50.0000 ug | Freq: Once | INTRAMUSCULAR | Status: AC
  Administered 2014-07-21: 50 ug via INTRAMUSCULAR

## 2014-07-21 MED ORDER — NITROGLYCERIN 0.4 MG SL SUBL: 0.4000 mg | SUBLINGUAL_TABLET | SUBLINGUAL | Status: DC | PRN

## 2014-07-21 MED ORDER — POTASSIUM CHLORIDE ER 10 MEQ PO TBCR
10.0000 meq | EXTENDED_RELEASE_TABLET | Freq: Every day | ORAL | Status: DC
  Administered 2014-07-22 – 2014-07-24 (×3): 10 meq via ORAL
  Filled 2014-07-21 (×7): qty 1

## 2014-07-21 MED ORDER — FENTANYL CITRATE 0.05 MG/ML IJ SOLN
50.0000 ug | Freq: Once | INTRAMUSCULAR | Status: AC
  Administered 2014-07-21: 50 ug via INTRAMUSCULAR
  Filled 2014-07-21: qty 2

## 2014-07-21 MED ORDER — HYDROMORPHONE HCL 1 MG/ML IJ SOLN
1.0000 mg | INTRAMUSCULAR | Status: DC | PRN
  Administered 2014-07-21 – 2014-07-23 (×9): 1 mg via INTRAVENOUS
  Filled 2014-07-21 (×9): qty 1

## 2014-07-21 MED ORDER — FUROSEMIDE 20 MG PO TABS
20.0000 mg | ORAL_TABLET | Freq: Every day | ORAL | Status: DC
  Administered 2014-07-22 – 2014-07-23 (×2): 20 mg via ORAL
  Filled 2014-07-21 (×3): qty 1

## 2014-07-21 MED ORDER — SODIUM CHLORIDE 0.9 % IJ SOLN
3.0000 mL | Freq: Two times a day (BID) | INTRAMUSCULAR | Status: DC
  Administered 2014-07-22 – 2014-07-25 (×7): 3 mL via INTRAVENOUS

## 2014-07-21 MED ORDER — LORAZEPAM 1 MG PO TABS: 1.0000 mg | ORAL_TABLET | Freq: Every day | ORAL | Status: DC | PRN

## 2014-07-21 MED ORDER — ONDANSETRON HCL 4 MG PO TABS
4.0000 mg | ORAL_TABLET | Freq: Four times a day (QID) | ORAL | Status: DC | PRN
  Administered 2014-07-22: 4 mg via ORAL
  Filled 2014-07-21: qty 1

## 2014-07-21 MED ORDER — MOMETASONE FURO-FORMOTEROL FUM 100-5 MCG/ACT IN AERO
2.0000 | INHALATION_SPRAY | Freq: Two times a day (BID) | RESPIRATORY_TRACT | Status: DC
  Administered 2014-07-22 – 2014-07-25 (×5): 2 via RESPIRATORY_TRACT
  Filled 2014-07-21 (×2): qty 8.8

## 2014-07-21 MED ORDER — PANTOPRAZOLE SODIUM 40 MG PO TBEC
40.0000 mg | DELAYED_RELEASE_TABLET | Freq: Every day | ORAL | Status: DC
  Administered 2014-07-22 – 2014-07-25 (×4): 40 mg via ORAL
  Filled 2014-07-21 (×4): qty 1

## 2014-07-21 MED ORDER — ALBUTEROL SULFATE HFA 108 (90 BASE) MCG/ACT IN AERS: 2.0000 | INHALATION_SPRAY | RESPIRATORY_TRACT | Status: DC | PRN

## 2014-07-21 MED ORDER — ASPIRIN EC 81 MG PO TBEC
81.0000 mg | DELAYED_RELEASE_TABLET | Freq: Every day | ORAL | Status: DC
  Administered 2014-07-22 – 2014-07-25 (×4): 81 mg via ORAL
  Filled 2014-07-21 (×5): qty 1

## 2014-07-21 MED ORDER — LEVOTHYROXINE SODIUM 75 MCG PO TABS
75.0000 ug | ORAL_TABLET | Freq: Every day | ORAL | Status: DC
  Administered 2014-07-22: 75 ug via ORAL
  Filled 2014-07-21 (×3): qty 1

## 2014-07-21 MED ORDER — METOPROLOL SUCCINATE ER 25 MG PO TB24
25.0000 mg | ORAL_TABLET | Freq: Every day | ORAL | Status: DC
  Administered 2014-07-22: 25 mg via ORAL
  Filled 2014-07-21 (×5): qty 1

## 2014-07-21 MED ORDER — FENTANYL CITRATE 0.05 MG/ML IJ SOLN
50.0000 ug | Freq: Once | INTRAMUSCULAR | Status: DC
  Filled 2014-07-21: qty 2

## 2014-07-21 MED ORDER — HEPARIN SODIUM (PORCINE) 5000 UNIT/ML IJ SOLN
5000.0000 [IU] | Freq: Three times a day (TID) | INTRAMUSCULAR | Status: DC
  Administered 2014-07-22 – 2014-07-24 (×7): 5000 [IU] via SUBCUTANEOUS
  Filled 2014-07-21 (×10): qty 1

## 2014-07-21 MED ORDER — LOSARTAN POTASSIUM 50 MG PO TABS
50.0000 mg | ORAL_TABLET | Freq: Every day | ORAL | Status: DC
  Administered 2014-07-22: 50 mg via ORAL
  Filled 2014-07-21 (×4): qty 1

## 2014-07-21 NOTE — ED Notes (Signed)
Recalled answering service and advised that the on call doctor would be paged to [facility] phone.

## 2014-07-21 NOTE — ED Provider Notes (Signed)
CSN: [id number]     Arrival date & time 07/21/14  1637 History   First MD Initiated Contact with Patient 07/21/14 1642     Chief Complaint  Patient presents with  . Fall     (Consider location/radiation/quality/duration/timing/severity/associated sxs/prior Treatment) HPI Comments: Patient presents with left shoulder and hip pain after fall. She got up from the couch and was using her walker when she lost her balance and fell onto her left side. Denies hitting head or losing consciousness. Complains of pain in left shoulder and left hip. She's had previous left hip replacement. She's also had a right hip for placement but no pain on that side. Denies any headache, neck pain, chest pain, back pain or shortness of breath. Denies any palpitations, chest pain or shortness of breath preceding the fall. Denies any focal weakness or numbness. She is on aspirin but no other blood thinners.    The history is provided by the patient.    Past Medical History  Diagnosis Date  . Hypertension   . Hypercholesteremia   . Myocardial infarction     Inferior STEMI 07/2008 - PCI of prox RCA followed byu CABG x 3 in 9/'09  . Pneumonia   . Anxiety   . CAD in native artery     s/p CABG x 3; Last Myoview in 07/2009 - non-ischemic; Echo 03/2012  Aortic Sclerosis with normal EF.  Marland Kitchen Asthma   . Pacemaker   . GERD (gastroesophageal reflux disease)   . Arthritis   . Status post hip hemiarthroplasty left hip by Dr Ronnie Derby 08/08/2012  . PAF (paroxysmal atrial fibrillation)   . CKD (chronic kidney disease), stage III   . DVT (deep vein thrombosis) in pregnancy   . Esophageal perforation     9/13  . Mediastinitis     s/p drainage  . Hypothyroidism   . Shortness of breath    Past Surgical History  Procedure Laterality Date  . Coronary artery bypass graft  2009    LIMA-LAD, SVG-RI, SVG-stented RCA  . Total hip arthroplasty    . Pacemaker insertion  01/06/2009    [employer]  . Wrist fracture surgery  07/2012     left intra articular  with carpel tunnel  . Carpal tunnel release  08/08/2012    Procedure: CARPAL TUNNEL RELEASE;  Surgeon: Schuyler Amor, MD;  Location: [place];  Service: Orthopedics;  Laterality: Left;  . Hip arthroplasty  08/08/2012    Procedure: ARTHROPLASTY BIPOLAR HIP;  Surgeon: Rudean Haskell, MD;  Location: [place];  Service: Orthopedics;  Laterality: Left;  [place]   . Esophagogastroduodenoscopy  08/10/2012    Procedure: ESOPHAGOGASTRODUODENOSCOPY (EGD);  Surgeon: Beryle Beams, MD;  Location: [facility] ENDOSCOPY;  Service: Endoscopy;  Laterality: N/A;  . Esophagoscopy  08/10/2012    Procedure: ESOPHAGOSCOPY;  Surgeon: [name], MD;  Location: [place];  Service: ENT;  Laterality: N/A;  . Gastrostomy  08/16/2012    Procedure: GASTROSTOMY;  Surgeon: Zenovia Jarred, MD;  Location: [place];  Service: General;  Laterality: N/A;  . Joint replacement    . Nm myocar perf wall motion  08/14/2009    Normal   Family History  Problem Relation Age of Onset  . Stroke Father    History  Substance Use Topics  . Smoking status: Never Smoker   . Smokeless tobacco: Never Used  . Alcohol Use: No   OB History   Grav Para Term Preterm Abortions TAB SAB Ect Mult Living  Review of Systems  Constitutional: Negative for fever, activity change and appetite change.  Respiratory: Negative for cough, chest tightness and shortness of breath.   Cardiovascular: Negative for chest pain.  Gastrointestinal: Negative for nausea, vomiting and abdominal pain.  Genitourinary: Negative for dysuria and hematuria.  Musculoskeletal: Positive for arthralgias, back pain and myalgias. Negative for neck pain.  Skin: Negative for rash.  Neurological: Negative for facial asymmetry, weakness and headaches.  A complete 10 system review of systems was obtained and all systems are negative except as noted in the HPI and PMH.      Allergies  Colestipol; Hydrocodone-acetaminophen; Niacin and related;  Statins; and Zetia  Home Medications   Prior to Admission medications   Medication Sig Start Date End Date Taking? Authorizing Provider  aspirin EC 81 MG tablet Take 81 mg by mouth daily.   Yes Historical Provider, MD  Fluticasone-Salmeterol (ADVAIR) 100-50 MCG/DOSE AEPB Inhale 1 puff into the lungs daily as needed (shortness of breath).  04/17/14  Yes Rhonda G Barrett, PA-C  furosemide (LASIX) 20 MG tablet Take 20 mg by mouth daily.   Yes Historical Provider, MD  levothyroxine (SYNTHROID, LEVOTHROID) 75 MCG tablet Take 75 mcg by mouth daily before breakfast.   Yes Historical Provider, MD  LORazepam (ATIVAN) 1 MG tablet Take 1 mg by mouth daily as needed for anxiety.   Yes Historical Provider, MD  losartan (COZAAR) 50 MG tablet Take 1 tablet (50 mg total) by mouth daily. 04/17/14  Yes Rhonda G Barrett, PA-C  metoprolol succinate (TOPROL-XL) 25 MG 24 hr tablet Take 1 tablet (25 mg total) by mouth daily. 11/26/13  Yes Mihai Croitoru, MD  ondansetron (ZOFRAN) 4 MG tablet Take 4 mg by mouth every 6 (six) hours as needed for nausea or vomiting.   Yes Historical Provider, MD  pantoprazole (PROTONIX) 40 MG tablet Take 40 mg by mouth daily.   Yes Historical Provider, MD  potassium chloride (K-DUR) 10 MEQ tablet Take 1 tablet (10 mEq total) by mouth daily. 05/27/14  Yes Mihai Croitoru, MD  albuterol (PROVENTIL HFA;VENTOLIN HFA) 108 (90 BASE) MCG/ACT inhaler Inhale 2 puffs into the lungs every 4 (four) hours as needed for wheezing or shortness of breath. 05/27/14   Mihai Croitoru, MD  nitroGLYCERIN (NITROSTAT) 0.4 MG SL tablet Place 1 tablet (0.4 mg total) under the tongue every 5 (five) minutes x 3 doses as needed for chest pain. 04/17/14   Rhonda G Barrett, PA-C   BP 116/63  Pulse 94  Temp(Src) 97.7 F (36.5 C) (Oral)  Resp 19  Ht 5' 5.5" (1.664 m)  Wt 155 lb (70.308 kg)  BMI 25.39 kg/m2  SpO2 96% Physical Exam  Nursing note and vitals reviewed. Constitutional: She is oriented to person, place, and  time. She appears well-developed and well-nourished. No distress.  HENT:  Head: Normocephalic and atraumatic.  Mouth/Throat: Oropharynx is clear and moist. No oropharyngeal exudate.  Eyes: Conjunctivae and EOM are normal. Pupils are equal, round, and reactive to light.  Neck: Normal range of motion. Neck supple.  NO c spine tenderness  Cardiovascular: Normal rate, regular rhythm, normal heart sounds and intact distal pulses.   No murmur heard. Pulmonary/Chest: Effort normal and breath sounds normal. No respiratory distress.  Abdominal: Soft. There is no tenderness. There is no rebound and no guarding.  Musculoskeletal: She exhibits tenderness. She exhibits no edema.  Holding of left shoulder and guarded position. Tenderness to palpation along anterior joint line. Intact radial pulse. No T. or L-spine tenderness  Left lateral hip tenderness. No shortening or external rotation. Intact DP and PT pulse   Neurological: She is alert and oriented to person, place, and time. No cranial nerve deficit. She exhibits normal muscle tone. Coordination normal.  No ataxia on finger to nose bilaterally. No pronator drift. 5/5 strength throughout. CN 2-12 intact. Negative Romberg. Equal grip strength. Sensation intact. Gait is normal.   Skin: Skin is warm.  Psychiatric: She has a normal mood and affect. Her behavior is normal.    ED Course  Procedures (including critical care time) Labs Review Labs Reviewed  CBC WITH DIFFERENTIAL - Abnormal; Notable for the following:    WBC 17.3 (*)    Neutrophils Relative % 84 (*)    Neutro Abs 14.6 (*)    Lymphocytes Relative 10 (*)    All other components within normal limits  BASIC METABOLIC PANEL - Abnormal; Notable for the following:    Glucose, Bld 122 (*)    Creatinine, Ser 1.58 (*)    GFR calc non Af Amer 28 (*)    GFR calc Af Amer 32 (*)    All other components within normal limits  TROPONIN I  COMPREHENSIVE METABOLIC PANEL  CBC WITH DIFFERENTIAL     Imaging Review Dg Lumbar Spine Complete  07/21/2014   CLINICAL DATA:  Status post fall in the bathroom with low back and left hip pain; known left humeral head fracture.  EXAM: LUMBAR SPINE - COMPLETE 4+ VIEW  COMPARISON:  None.  FINDINGS: The lumbar vertebral bodies are diffusely osteopenic but are preserved in height. There is no spondylolisthesis. The disc space heights are well maintained. There is facet joint hypertrophy from L3-4 through L5-S1. The pedicles and transverse processes are intact where visualized. The observed portions of the sacrum are normal.  IMPRESSION: There is no acute lumbar spine fracture or dislocation. There are degenerative facet joint changes.   Electronically Signed   By: David  Martinique   On: 07/21/2014 18:11   Dg Pelvis 1-2 Views  07/21/2014   CLINICAL DATA:  Status post fall in the bathroom now with left-sided hip pain ; known fractures of the left shoulder.  EXAM: PELVIS - 1-2 VIEW  COMPARISON:  Hip series of December 02, 2012  FINDINGS: There is new lucency and irregularity within the midportion of the inferior pubic ramus on the left. This is worrisome for an acute fracture. The superior pubic ramus is intact. There are prosthetic hip joints bilaterally which appear intact. The iliac bones are intact. The observed portions of the sacrum are normal.  IMPRESSION: There is likely an impacted fracture of the inferior pubic ramus on the left.   Electronically Signed   By: David  Martinique   On: 07/21/2014 18:09   Dg Femur Left  07/21/2014   CLINICAL DATA:  Status post fall in the bathroom with known acute fractures of the shoulder. Left hip and femur pain  EXAM: LEFT FEMUR - 2 VIEW  COMPARISON:  AP pelvis of today's date  FINDINGS: There is a prosthetic left hip joint. The interface with the native bone appears normal. The positioning of the prosthetic femoral head within the native acetabulum is normal. The femoral shaft is normal in appearance. The observed portions of the  knee exhibit no acute abnormalities. There is new irregularity of the inferior pubic ramus.  IMPRESSION: There is no evidence of an acute fracture of the left femur. There is likely a nondisplaced fracture of the inferior pubic ramus on the left.  Electronically Signed   By: David  Martinique   On: 07/21/2014 18:08   Ct Head Wo Contrast  07/21/2014   CLINICAL DATA:  [place] today, left shoulder pain  EXAM: CT HEAD WITHOUT CONTRAST  CT CERVICAL SPINE WITHOUT CONTRAST  TECHNIQUE: Multidetector CT imaging of the head and cervical spine was performed following the standard protocol without intravenous contrast. Multiplanar CT image reconstructions of the cervical spine were also generated.  COMPARISON:  08/10/2012, 08/07/2012  FINDINGS: CT HEAD FINDINGS  Study limited by motion artifact. Diffuse atrophy. Mild low attenuation in the deep white matter. No evidence of vascular territory infarct, hemorrhage, extra-axial fluid, or mass. No skull fracture.  CT CERVICAL SPINE FINDINGS  Normal alignment with no fracture. Multilevel degenerative disc disease. No paraspinous hematoma. Mild pleural and parenchymal scarring at the lung apices. Extensive bilateral carotid calcification.  IMPRESSION: No acute abnormalities involving the cervical spine.  Evaluation of the head limited by motion artifact, but grossly negative.   Electronically Signed   By: Skipper Cliche M.D.   On: 07/21/2014 20:19   Ct Cervical Spine Wo Contrast  07/21/2014   CLINICAL DATA:  [place] today, left shoulder pain  EXAM: CT HEAD WITHOUT CONTRAST  CT CERVICAL SPINE WITHOUT CONTRAST  TECHNIQUE: Multidetector CT imaging of the head and cervical spine was performed following the standard protocol without intravenous contrast. Multiplanar CT image reconstructions of the cervical spine were also generated.  COMPARISON:  08/10/2012, 08/07/2012  FINDINGS: CT HEAD FINDINGS  Study limited by motion artifact. Diffuse atrophy. Mild low attenuation in the deep white  matter. No evidence of vascular territory infarct, hemorrhage, extra-axial fluid, or mass. No skull fracture.  CT CERVICAL SPINE FINDINGS  Normal alignment with no fracture. Multilevel degenerative disc disease. No paraspinous hematoma. Mild pleural and parenchymal scarring at the lung apices. Extensive bilateral carotid calcification.  IMPRESSION: No acute abnormalities involving the cervical spine.  Evaluation of the head limited by motion artifact, but grossly negative.   Electronically Signed   By: Skipper Cliche M.D.   On: 07/21/2014 20:19   Ct Pelvis Wo Contrast  07/21/2014   CLINICAL DATA:  Fall, left hip pain, status post bilateral hip replacement  EXAM: CT PELVIS WITHOUT CONTRAST  TECHNIQUE: Multidetector CT imaging of the pelvis was performed following the standard protocol without intravenous contrast.  COMPARISON:  CT abdomen pelvis dated 12/11/2012.  FINDINGS: Status post bilateral total hip arthroplasty.  No evidence of loosening or hardware complication.  Nondisplaced fractures involving the left sacrum (series 5/ image 35), left inferior pubic ramus (series 9/ image 60), and right superior pubic ramus/ parasymphyseal region (series 5/ image 87).  Soft tissue pelvis is markedly obscured by streak artifact from the patient's bilateral hip prostheses.  Visualized portions of the uterus and bladder are unremarkable.  Vascular calcifications.  No pelvic ascites.  IMPRESSION: Status post bilateral total hip arthroplasty, without evidence of complication.  Nondisplaced fractures involving the left sacrum, left inferior pubic ramus, and right superior pubic ramus/parasymphyseal region.   Electronically Signed   By: Julian Hy M.D.   On: 07/21/2014 20:17   Dg Chest Portable 1 View  07/21/2014   CLINICAL DATA:  Fall  EXAM: PORTABLE CHEST - 1 VIEW  COMPARISON:  04/15/2014  FINDINGS: Chronic interstitial markings/emphysematous changes. No focal consolidation. No pleural effusion or pneumothorax.   The heart is normal in size. Postsurgical changes related to prior CABG. Left subclavian pacemaker.  IMPRESSION: No evidence of acute cardiopulmonary disease.   Electronically Signed  By: Julian Hy M.D.   On: 07/21/2014 21:47   Dg Shoulder Left  07/21/2014   CLINICAL DATA:  Status post fall in the bathroom now complaining of severe left shoulder pain. ; history of previous MI, asthma, and pacemaker placement. Also chronic renal insufficiency.  EXAM: LEFT SHOULDER - 2+ VIEW  COMPARISON:  Left shoulder series of October 9th 2013  FINDINGS: The patient has sustained an acute angulated comminuted fracture of the left humeral head and neck. There is mild avulsion of the greater tuberosity. The observed portions of the scapula and left clavicle are intact. The observed left ribs are normal.  IMPRESSION: The patient has sustained an acute angulated fracture of the left humeral head and neck involving the greater tuberosity also.   Electronically Signed   By: David  Martinique   On: 07/21/2014 18:05   Dg Humerus Left  07/21/2014   CLINICAL DATA:  Status post fall in bathroom on left shoulder ; known post traumatic humeral head fracture  EXAM: LEFT HUMERUS - 2+ VIEW  COMPARISON:  Left shoulder series of today's date  FINDINGS: There is a known fracture involving the humeral head and neck. The humeral shaft is intact. The observed portion of the distal humerus is normal. The overlying soft tissues are unremarkable.  IMPRESSION: There is no humeral shaft fracture. There are post traumatic fractures of the humeral head and neck.   Electronically Signed   By: David  Martinique   On: 07/21/2014 18:06     EKG Interpretation None      MDM   Final diagnoses:  Proximal humerus fracture, left, closed, initial encounter  Multiple pelvic fractures, closed, initial encounter   Mechanical fall with left hip and shoulder pain. No headache. No chest pain or shortness of breath.  patient found to have acute fracture of  left humeral head and neck Also appears to have fracture left inferior pubic ramus. Prosthetics appear to be intact  Call placed to Dr. Ronnie Derby. D/w Dr. Percell Miller, on call for Dr. Ronnie Derby.  He agrees with shoulder immobilizer. This will likely not be a surgical fracture given patient's age and comorbidities. She can be weightbearing as tolerated on the pelvic fracture but may need admission for pain control and mobility.  Hip prostheses in place. CT scan shows multiple pelvic fractures including sacrum and pelvic rami.  Patient unable to ambulate.   Will need admission for pain control and possible placement. D/w Dr. Ernestina Patches.  Family may request [facility] for formal ortho evaluation.   Date: 07/21/2014  Rate: 70  Rhythm: normal sinus rhythm  QRS Axis: normal  Intervals: PR prolonged  ST/T Wave abnormalities: nonspecific ST/T changes  Conduction Disutrbances:none  Narrative Interpretation:   Old EKG Reviewed: unchanged    Ezequiel Essex, MD 07/21/14 2340

## 2014-07-21 NOTE — ED Notes (Signed)
Attempted IV access x2, other ED staff x2 with ultrasouns. EDP attempted IV access with ultrasound, with no success. Given verbal order to admin pain medicine IM at this time.

## 2014-07-21 NOTE — ED Notes (Signed)
Patient care taken over from [facility]. Patient received pain medication about 15 minutes ago. Patient states that pain has decreased some but no relief yet at this time. MD at bedside. Patient taken to CT as per order

## 2014-07-21 NOTE — ED Notes (Signed)
Pt remains in xray. Family at bedside.

## 2014-07-21 NOTE — ED Notes (Signed)
PT states she slipped and fell walking with her walker at home this evening. PT c/o left shoulder and left hip pain. PT leaves at home by herself and able to normally perform her own ADLs without assistance.

## 2014-07-21 NOTE — ED Notes (Signed)
Slipped and fell while getting off of couch. Pain to left shoulder and left hip with hx of replacement to same hip. Pt has not had her daily meds today, per EMS.

## 2014-07-21 NOTE — H&P (Signed)
Hospitalist Admission History and Physical  Patient name: Niger M Howton Medical record number: [id number] Date of birth: 1922-10-13 Age: 80 y.o. Gender: female  Primary Care Provider: Purvis Kilts, MD  Chief Complaint: fall, L humeral head fracture, L, R hip fracture  History of Present Illness:This is a 80 y.o. year old female with significant past medical history of CAD s/p CABG, PAF, HTN, arthritis, GERD, asthma, s/p bilateral hip arthroplasty  presenting with fall, multiple fractures including L humeral head, L sacrum, L inferior pubic rami, R superior pubic rami fracture. Pt is followed at [facility]. States that she had a mechanical fall at home earlier today where she fell, landing on her L side. Pt usually ambulates with a rolling walker. States that she has had progressive weakness over the last few months. Denies any CP, SOBm diaphoresis, hemiparesis prior to fall.  Presented to ER hemodynamically stable. Noted multiple fractures including humeral head, L sacrum, L inferior pubic rami, R superior pubic rami fracture on imaging. Head and C spine grossly negative.  Labs grossly negative apart from WBC 17.3 s/p fall, Cr 1.58. Trop neg x1. EKG NSR, HR 70. EDP  ([facility]) discussed case with on call ortho at [facility]. Spoke with Dr. Percell Miller who feels fractures are likely non operative with recs for pain control and weight bearing as tolerated. Pain has been minimally controlled with fentanyl thus far.   Assessment and Plan: Niger HERMIE HOK is a 80 y.o. year old female presenting with fall, multiple fractures    Active Problems:   Fall   Fall/Multiple Fractures -Per preliminary ortho recs  -pain control  -weight bearing as tolerated  -family would like to be transferred to [facility] for formal consultation/evaluation by [facility]-will set this up.  -PT/OT consult  -leukocytosis likely reactive in setting of multiple fractures-afebrile-follow/trend   2-CAD/HTN -stable   -no CP  -trop and EKG WNL  -cont home regimen  -tele bed  3- Asthma  -cont home regimen  -no resp distress/wheezing  4-CKD  -stage III chronically  -at baseline  -continue to follow  FEN/GI: heart healthy diet. PPI  Prophylaxis: sub q heparin  Disposition: pending further evaluation  Code Status:limited code-CPR only    Patient Active Problem List   Diagnosis Date Noted  . Fall 07/21/2014  . GERD (gastroesophageal reflux disease) 06/09/2014  . Unstable angina 04/15/2014  . C. difficile colitis 12/11/2012  . Abdominal pain, acute 12/06/2012  . Sigmoid diverticulitis 12/06/2012  . UTI (lower urinary tract infection) 12/06/2012  . S/P gastrostomy tube (G tube) placement, follow-up exam 10/17/2012  . Acute respiratory failure with hypoxia 08/14/2012  . Pulmonary edema, noncardiac 08/14/2012  . Acute blood loss anemia 08/12/2012  . Mediastinitis due to esophageal perforation-s/p Incision and drainage, right neck and mediastinum, transcervical approach. Cervical esophagoscopy 08/11/2012  . S/P right total hip arthroplasty by Dr Noemi Chapel in 2009 08/11/2012  . Odynophagia due to new traumatic peri-op esophageal perforation 08/09/2012  . Pre-operative clearance 08/08/2012  . Fracture of left hip after fall at home. 08/08/2012  . Aortic valve sclerosis, no stenosis 08/08/2012  . Carotid bruit present bilat, dopplers OK 6/13 08/08/2012  . History of UTI- recent completion 10 days Cipro 08/08/2012  . Hx of dizziness 08/08/2012  . Chronic renal insufficiency, stage III (moderate), SCr 1.4 Sept 2013 08/08/2012  . PAF/SSS 08/08/2012  . Pacemaker March 2010 (MDT) 08/08/2012  . DVT after previous hip surgery 08/08/2012  . Status post hip hemiarthroplasty left hip by  Dr Ronnie Derby 08/08/2012 08/08/2012  . Closed fracture of left distal radius s/p ORIF by Dr Burney Gauze 08/08/2012 08/08/2012  . Radial head fracture, closed, Lt 08/07/2012  . Hypertension 08/07/2012  . Hyperlipidemia, statin  intol. 08/07/2012  . CAD, CABG X 3 10/09. Myoview low risk 10/10. EF >55% 2D 6/13. 08/07/2012  . DYSPNEA 07/28/2009   Past Medical History: Past Medical History  Diagnosis Date  . Hypertension   . Hypercholesteremia   . Myocardial infarction     Inferior STEMI 07/2008 - PCI of prox RCA followed byu CABG x 3 in 9/'09  . Pneumonia   . Anxiety   . CAD in native artery     s/p CABG x 3; Last Myoview in 07/2009 - non-ischemic; Echo 03/2012  Aortic Sclerosis with normal EF.  Marland Kitchen Asthma   . Pacemaker   . GERD (gastroesophageal reflux disease)   . Arthritis   . Status post hip hemiarthroplasty left hip by Dr Ronnie Derby 08/08/2012  . PAF (paroxysmal atrial fibrillation)   . CKD (chronic kidney disease), stage III   . DVT (deep vein thrombosis) in pregnancy   . Esophageal perforation     9/13  . Mediastinitis     s/p drainage  . Hypothyroidism   . Shortness of breath     Past Surgical History: Past Surgical History  Procedure Laterality Date  . Coronary artery bypass graft  2009    LIMA-LAD, SVG-RI, SVG-stented RCA  . Total hip arthroplasty    . Pacemaker insertion  01/06/2009    [employer]tronic  . Wrist fracture surgery  07/2012    left intra articular  with carpel tunnel  . Carpal tunnel release  08/08/2012    Procedure: CARPAL TUNNEL RELEASE;  Surgeon: Schuyler Amor, MD;  Location: [place];  Service: Orthopedics;  Laterality: Left;  . Hip arthroplasty  08/08/2012    Procedure: ARTHROPLASTY BIPOLAR HIP;  Surgeon: Rudean Haskell, MD;  Location: [place];  Service: Orthopedics;  Laterality: Left;  [place]   . Esophagogastroduodenoscopy  08/10/2012    Procedure: ESOPHAGOGASTRODUODENOSCOPY (EGD);  Surgeon: Beryle Beams, MD;  Location: [facility] ENDOSCOPY;  Service: Endoscopy;  Laterality: N/A;  . Esophagoscopy  08/10/2012    Procedure: ESOPHAGOSCOPY;  Surgeon: [name], MD;  Location: [place];  Service: ENT;  Laterality: N/A;  . Gastrostomy  08/16/2012    Procedure: GASTROSTOMY;  Surgeon: Zenovia Jarred, MD;  Location: [place];  Service: General;  Laterality: N/A;  . Joint replacement    . Nm myocar perf wall motion  08/14/2009    Normal    Social History: History   Social History  . Marital Status: Widowed    Spouse Name: N/A    Number of Children: N/A  . Years of Education: N/A   Social History Main Topics  . Smoking status: Never Smoker   . Smokeless tobacco: Never Used  . Alcohol Use: No  . Drug Use: No  . Sexual Activity: No   Other Topics Concern  . None   Social History Narrative  . None    Family History: Family History  Problem Relation Age of Onset  . Stroke Father     Allergies: Allergies  Allergen Reactions  . Colestipol     Muscle aches  . Hydrocodone-Acetaminophen Itching    Tolerates tylenol  . Niacin And Related   . Statins     Muscle aches on all she has tried.  She recalls Lipitor, Crestor, and Livalo but thinks there  were others  . Zetia [Ezetimibe]     Muscle aches    Current Facility-Administered Medications  Medication Dose Route Frequency Provider Last Rate Last Dose  . 0.9 %  sodium chloride infusion   Intravenous Continuous Shanda Howells, MD      . albuterol (PROVENTIL HFA;VENTOLIN HFA) 108 (90 BASE) MCG/ACT inhaler 2 puff  2 puff Inhalation Q4H PRN Shanda Howells, MD      . aspirin EC tablet 81 mg  81 mg Oral Daily Shanda Howells, MD      . furosemide (LASIX) tablet 20 mg  20 mg Oral Daily Shanda Howells, MD      . heparin injection 5,000 Units  5,000 Units Subcutaneous 3 times per day Shanda Howells, MD      . HYDROmorphone (DILAUDID) injection 1-2 mg  1-2 mg Intravenous Q2H PRN Shanda Howells, MD      . Derrill Memo ON 07/22/2014] levothyroxine (SYNTHROID, LEVOTHROID) tablet 75 mcg  75 mcg Oral QAC breakfast Shanda Howells, MD      . LORazepam (ATIVAN) tablet 1 mg  1 mg Oral Daily PRN Shanda Howells, MD      . losartan (COZAAR) tablet 50 mg  50 mg Oral Daily Shanda Howells, MD      . metoprolol succinate (TOPROL-XL) 24 hr tablet 25 mg   25 mg Oral Daily Shanda Howells, MD      . mometasone-formoterol (DULERA) 100-5 MCG/ACT inhaler 2 puff  2 puff Inhalation BID Shanda Howells, MD      . nitroGLYCERIN (NITROSTAT) SL tablet 0.4 mg  0.4 mg Sublingual Q5 Min x 3 PRN Shanda Howells, MD      . ondansetron [facility]) tablet 4 mg  4 mg Oral Q6H PRN Shanda Howells, MD      . pantoprazole (PROTONIX) EC tablet 40 mg  40 mg Oral Daily Shanda Howells, MD      . potassium chloride (K-DUR) CR tablet 10 mEq  10 mEq Oral Daily Shanda Howells, MD      . sodium chloride 0.9 % injection 3 mL  3 mL Intravenous Q12H Shanda Howells, MD       Current Outpatient Prescriptions  Medication Sig Dispense Refill  . aspirin EC 81 MG tablet Take 81 mg by mouth daily.      . Fluticasone-Salmeterol (ADVAIR) 100-50 MCG/DOSE AEPB Inhale 1 puff into the lungs daily as needed (shortness of breath).       . furosemide (LASIX) 20 MG tablet Take 20 mg by mouth daily.      Marland Kitchen levothyroxine (SYNTHROID, LEVOTHROID) 75 MCG tablet Take 75 mcg by mouth daily before breakfast.      . LORazepam (ATIVAN) 1 MG tablet Take 1 mg by mouth daily as needed for anxiety.      Marland Kitchen losartan (COZAAR) 50 MG tablet Take 1 tablet (50 mg total) by mouth daily.  30 tablet  3  . metoprolol succinate (TOPROL-XL) 25 MG 24 hr tablet Take 1 tablet (25 mg total) by mouth daily.  90 tablet  3  . ondansetron (ZOFRAN) 4 MG tablet Take 4 mg by mouth every 6 (six) hours as needed for nausea or vomiting.      . pantoprazole (PROTONIX) 40 MG tablet Take 40 mg by mouth daily.      . potassium chloride (K-DUR) 10 MEQ tablet Take 1 tablet (10 mEq total) by mouth daily.  30 tablet  6  . albuterol (PROVENTIL HFA;VENTOLIN HFA) 108 (90 BASE) MCG/ACT inhaler Inhale 2 puffs into the  lungs every 4 (four) hours as needed for wheezing or shortness of breath.  1 Inhaler  1  . nitroGLYCERIN (NITROSTAT) 0.4 MG SL tablet Place 1 tablet (0.4 mg total) under the tongue every 5 (five) minutes x 3 doses as needed for chest pain.  25  tablet  12   Review Of Systems: 12 point ROS negative except as noted above in HPI.  Physical Exam: Filed Vitals:   07/21/14 2040  BP: 117/44  Pulse: 88  Temp:   Resp: 15    General: alert and cooperative HEENT: PERRLA and extra ocular movement intact Heart: S1, S2 normal, no murmur, rub or gallop, regular rate and rhythm Lungs: clear to auscultation, no wheezes or rales and unlabored breathing Abdomen: abdomen is soft without significant tenderness, masses, organomegaly or guarding Extremities: R shoulder TTP, bilateral hip TTP-R shoulder in sling  Skin:no rashes Neurology: liited exam due to pain, otherwise no focal abnormalities noted   Labs and Imaging: Lab Results  Component Value Date/Time   NA 138 07/21/2014  7:13 PM   K 4.2 07/21/2014  7:13 PM   CL 99 07/21/2014  7:13 PM   CO2 24 07/21/2014  7:13 PM   BUN 18 07/21/2014  7:13 PM   CREATININE 1.58* 07/21/2014  7:13 PM   CREATININE 1.86* 07/15/2014 12:06 PM   GLUCOSE 122* 07/21/2014  7:13 PM   Lab Results  Component Value Date   WBC 17.3* 07/21/2014   HGB 12.6 07/21/2014   HCT 38.2 07/21/2014   MCV 89.3 07/21/2014   PLT 187 07/21/2014    Dg Lumbar Spine Complete  07/21/2014   CLINICAL DATA:  Status post fall in the bathroom with low back and left hip pain; known left humeral head fracture.  EXAM: LUMBAR SPINE - COMPLETE 4+ VIEW  COMPARISON:  None.  FINDINGS: The lumbar vertebral bodies are diffusely osteopenic but are preserved in height. There is no spondylolisthesis. The disc space heights are well maintained. There is facet joint hypertrophy from L3-4 through L5-S1. The pedicles and transverse processes are intact where visualized. The observed portions of the sacrum are normal.  IMPRESSION: There is no acute lumbar spine fracture or dislocation. There are degenerative facet joint changes.   Electronically Signed   By: David  Martinique   On: 07/21/2014 18:11   Dg Pelvis 1-2 Views  07/21/2014   CLINICAL DATA:  Status post fall  in the bathroom now with left-sided hip pain ; known fractures of the left shoulder.  EXAM: PELVIS - 1-2 VIEW  COMPARISON:  Hip series of December 02, 2012  FINDINGS: There is new lucency and irregularity within the midportion of the inferior pubic ramus on the left. This is worrisome for an acute fracture. The superior pubic ramus is intact. There are prosthetic hip joints bilaterally which appear intact. The iliac bones are intact. The observed portions of the sacrum are normal.  IMPRESSION: There is likely an impacted fracture of the inferior pubic ramus on the left.   Electronically Signed   By: David  Martinique   On: 07/21/2014 18:09   Dg Femur Left  07/21/2014   CLINICAL DATA:  Status post fall in the bathroom with known acute fractures of the shoulder. Left hip and femur pain  EXAM: LEFT FEMUR - 2 VIEW  COMPARISON:  AP pelvis of today's date  FINDINGS: There is a prosthetic left hip joint. The interface with the native bone appears normal. The positioning of the prosthetic femoral head within the native  acetabulum is normal. The femoral shaft is normal in appearance. The observed portions of the knee exhibit no acute abnormalities. There is new irregularity of the inferior pubic ramus.  IMPRESSION: There is no evidence of an acute fracture of the left femur. There is likely a nondisplaced fracture of the inferior pubic ramus on the left.   Electronically Signed   By: David  Martinique   On: 07/21/2014 18:08   Ct Head Wo Contrast  07/21/2014   CLINICAL DATA:  [place] today, left shoulder pain  EXAM: CT HEAD WITHOUT CONTRAST  CT CERVICAL SPINE WITHOUT CONTRAST  TECHNIQUE: Multidetector CT imaging of the head and cervical spine was performed following the standard protocol without intravenous contrast. Multiplanar CT image reconstructions of the cervical spine were also generated.  COMPARISON:  08/10/2012, 08/07/2012  FINDINGS: CT HEAD FINDINGS  Study limited by motion artifact. Diffuse atrophy. Mild low attenuation  in the deep white matter. No evidence of vascular territory infarct, hemorrhage, extra-axial fluid, or mass. No skull fracture.  CT CERVICAL SPINE FINDINGS  Normal alignment with no fracture. Multilevel degenerative disc disease. No paraspinous hematoma. Mild pleural and parenchymal scarring at the lung apices. Extensive bilateral carotid calcification.  IMPRESSION: No acute abnormalities involving the cervical spine.  Evaluation of the head limited by motion artifact, but grossly negative.   Electronically Signed   By: Skipper Cliche M.D.   On: 07/21/2014 20:19   Ct Cervical Spine Wo Contrast  07/21/2014   CLINICAL DATA:  [place] today, left shoulder pain  EXAM: CT HEAD WITHOUT CONTRAST  CT CERVICAL SPINE WITHOUT CONTRAST  TECHNIQUE: Multidetector CT imaging of the head and cervical spine was performed following the standard protocol without intravenous contrast. Multiplanar CT image reconstructions of the cervical spine were also generated.  COMPARISON:  08/10/2012, 08/07/2012  FINDINGS: CT HEAD FINDINGS  Study limited by motion artifact. Diffuse atrophy. Mild low attenuation in the deep white matter. No evidence of vascular territory infarct, hemorrhage, extra-axial fluid, or mass. No skull fracture.  CT CERVICAL SPINE FINDINGS  Normal alignment with no fracture. Multilevel degenerative disc disease. No paraspinous hematoma. Mild pleural and parenchymal scarring at the lung apices. Extensive bilateral carotid calcification.  IMPRESSION: No acute abnormalities involving the cervical spine.  Evaluation of the head limited by motion artifact, but grossly negative.   Electronically Signed   By: Skipper Cliche M.D.   On: 07/21/2014 20:19   Ct Pelvis Wo Contrast  07/21/2014   CLINICAL DATA:  Fall, left hip pain, status post bilateral hip replacement  EXAM: CT PELVIS WITHOUT CONTRAST  TECHNIQUE: Multidetector CT imaging of the pelvis was performed following the standard protocol without intravenous contrast.   COMPARISON:  CT abdomen pelvis dated 12/11/2012.  FINDINGS: Status post bilateral total hip arthroplasty.  No evidence of loosening or hardware complication.  Nondisplaced fractures involving the left sacrum (series 5/ image 35), left inferior pubic ramus (series 9/ image 60), and right superior pubic ramus/ parasymphyseal region (series 5/ image 87).  Soft tissue pelvis is markedly obscured by streak artifact from the patient's bilateral hip prostheses.  Visualized portions of the uterus and bladder are unremarkable.  Vascular calcifications.  No pelvic ascites.  IMPRESSION: Status post bilateral total hip arthroplasty, without evidence of complication.  Nondisplaced fractures involving the left sacrum, left inferior pubic ramus, and right superior pubic ramus/parasymphyseal region.   Electronically Signed   By: Julian Hy M.D.   On: 07/21/2014 20:17   Dg Shoulder Left  07/21/2014   CLINICAL  DATA:  Status post fall in the bathroom now complaining of severe left shoulder pain. ; history of previous MI, asthma, and pacemaker placement. Also chronic renal insufficiency.  EXAM: LEFT SHOULDER - 2+ VIEW  COMPARISON:  Left shoulder series of October 9th 2013  FINDINGS: The patient has sustained an acute angulated comminuted fracture of the left humeral head and neck. There is mild avulsion of the greater tuberosity. The observed portions of the scapula and left clavicle are intact. The observed left ribs are normal.  IMPRESSION: The patient has sustained an acute angulated fracture of the left humeral head and neck involving the greater tuberosity also.   Electronically Signed   By: David  Martinique   On: 07/21/2014 18:05   Dg Humerus Left  07/21/2014   CLINICAL DATA:  Status post fall in bathroom on left shoulder ; known post traumatic humeral head fracture  EXAM: LEFT HUMERUS - 2+ VIEW  COMPARISON:  Left shoulder series of today's date  FINDINGS: There is a known fracture involving the humeral head and neck.  The humeral shaft is intact. The observed portion of the distal humerus is normal. The overlying soft tissues are unremarkable.  IMPRESSION: There is no humeral shaft fracture. There are post traumatic fractures of the humeral head and neck.   Electronically Signed   By: David  Martinique   On: 07/21/2014 18:06           Shanda Howells MD  Pager: [phone]

## 2014-07-22 ENCOUNTER — Encounter (HOSPITAL_COMMUNITY): Payer: Self-pay | Admitting: General Practice

## 2014-07-22 ENCOUNTER — Observation Stay (HOSPITAL_COMMUNITY): Payer: Medicare Other

## 2014-07-22 DIAGNOSIS — S3282XA Multiple fractures of pelvis without disruption of pelvic ring, initial encounter for closed fracture: Secondary | ICD-10-CM

## 2014-07-22 DIAGNOSIS — S42209A Unspecified fracture of upper end of unspecified humerus, initial encounter for closed fracture: Secondary | ICD-10-CM | POA: Diagnosis not present

## 2014-07-22 DIAGNOSIS — Z951 Presence of aortocoronary bypass graft: Secondary | ICD-10-CM | POA: Diagnosis not present

## 2014-07-22 DIAGNOSIS — S42293A Other displaced fracture of upper end of unspecified humerus, initial encounter for closed fracture: Secondary | ICD-10-CM | POA: Diagnosis not present

## 2014-07-22 DIAGNOSIS — I5032 Chronic diastolic (congestive) heart failure: Secondary | ICD-10-CM | POA: Diagnosis present

## 2014-07-22 DIAGNOSIS — R262 Difficulty in walking, not elsewhere classified: Secondary | ICD-10-CM | POA: Diagnosis not present

## 2014-07-22 DIAGNOSIS — S3210XA Unspecified fracture of sacrum, initial encounter for closed fracture: Secondary | ICD-10-CM | POA: Diagnosis present

## 2014-07-22 DIAGNOSIS — S46909A Unspecified injury of unspecified muscle, fascia and tendon at shoulder and upper arm level, unspecified arm, initial encounter: Secondary | ICD-10-CM | POA: Diagnosis not present

## 2014-07-22 DIAGNOSIS — I959 Hypotension, unspecified: Secondary | ICD-10-CM | POA: Diagnosis present

## 2014-07-22 DIAGNOSIS — S42292A Other displaced fracture of upper end of left humerus, initial encounter for closed fracture: Secondary | ICD-10-CM | POA: Diagnosis present

## 2014-07-22 DIAGNOSIS — S99919A Unspecified injury of unspecified ankle, initial encounter: Secondary | ICD-10-CM | POA: Diagnosis not present

## 2014-07-22 DIAGNOSIS — M6281 Muscle weakness (generalized): Secondary | ICD-10-CM | POA: Diagnosis not present

## 2014-07-22 DIAGNOSIS — J45909 Unspecified asthma, uncomplicated: Secondary | ICD-10-CM | POA: Diagnosis present

## 2014-07-22 DIAGNOSIS — S32509A Unspecified fracture of unspecified pubis, initial encounter for closed fracture: Secondary | ICD-10-CM | POA: Diagnosis present

## 2014-07-22 DIAGNOSIS — I129 Hypertensive chronic kidney disease with stage 1 through stage 4 chronic kidney disease, or unspecified chronic kidney disease: Secondary | ICD-10-CM | POA: Diagnosis present

## 2014-07-22 DIAGNOSIS — Z888 Allergy status to other drugs, medicaments and biological substances status: Secondary | ICD-10-CM | POA: Diagnosis not present

## 2014-07-22 DIAGNOSIS — K219 Gastro-esophageal reflux disease without esophagitis: Secondary | ICD-10-CM | POA: Diagnosis present

## 2014-07-22 DIAGNOSIS — M25559 Pain in unspecified hip: Secondary | ICD-10-CM | POA: Diagnosis present

## 2014-07-22 DIAGNOSIS — W19XXXA Unspecified fall, initial encounter: Secondary | ICD-10-CM

## 2014-07-22 DIAGNOSIS — Z5189 Encounter for other specified aftercare: Secondary | ICD-10-CM | POA: Diagnosis not present

## 2014-07-22 DIAGNOSIS — IMO0001 Reserved for inherently not codable concepts without codable children: Secondary | ICD-10-CM | POA: Diagnosis not present

## 2014-07-22 DIAGNOSIS — Z885 Allergy status to narcotic agent status: Secondary | ICD-10-CM | POA: Diagnosis not present

## 2014-07-22 DIAGNOSIS — G40909 Epilepsy, unspecified, not intractable, without status epilepticus: Secondary | ICD-10-CM | POA: Diagnosis not present

## 2014-07-22 DIAGNOSIS — M79609 Pain in unspecified limb: Secondary | ICD-10-CM | POA: Diagnosis not present

## 2014-07-22 DIAGNOSIS — W010XXA Fall on same level from slipping, tripping and stumbling without subsequent striking against object, initial encounter: Secondary | ICD-10-CM | POA: Diagnosis present

## 2014-07-22 DIAGNOSIS — E039 Hypothyroidism, unspecified: Secondary | ICD-10-CM | POA: Diagnosis present

## 2014-07-22 DIAGNOSIS — S4980XA Other specified injuries of shoulder and upper arm, unspecified arm, initial encounter: Secondary | ICD-10-CM | POA: Diagnosis not present

## 2014-07-22 DIAGNOSIS — N390 Urinary tract infection, site not specified: Secondary | ICD-10-CM | POA: Diagnosis not present

## 2014-07-22 DIAGNOSIS — S8990XA Unspecified injury of unspecified lower leg, initial encounter: Secondary | ICD-10-CM | POA: Diagnosis not present

## 2014-07-22 DIAGNOSIS — I10 Essential (primary) hypertension: Secondary | ICD-10-CM

## 2014-07-22 DIAGNOSIS — I2510 Atherosclerotic heart disease of native coronary artery without angina pectoris: Secondary | ICD-10-CM | POA: Diagnosis present

## 2014-07-22 DIAGNOSIS — IMO0002 Reserved for concepts with insufficient information to code with codable children: Secondary | ICD-10-CM

## 2014-07-22 DIAGNOSIS — N179 Acute kidney failure, unspecified: Secondary | ICD-10-CM | POA: Diagnosis not present

## 2014-07-22 DIAGNOSIS — R509 Fever, unspecified: Secondary | ICD-10-CM | POA: Diagnosis not present

## 2014-07-22 DIAGNOSIS — R1310 Dysphagia, unspecified: Secondary | ICD-10-CM | POA: Diagnosis not present

## 2014-07-22 DIAGNOSIS — I359 Nonrheumatic aortic valve disorder, unspecified: Secondary | ICD-10-CM | POA: Diagnosis present

## 2014-07-22 DIAGNOSIS — Z7982 Long term (current) use of aspirin: Secondary | ICD-10-CM | POA: Diagnosis not present

## 2014-07-22 DIAGNOSIS — I252 Old myocardial infarction: Secondary | ICD-10-CM | POA: Diagnosis not present

## 2014-07-22 DIAGNOSIS — I509 Heart failure, unspecified: Secondary | ICD-10-CM | POA: Diagnosis present

## 2014-07-22 DIAGNOSIS — S42309D Unspecified fracture of shaft of humerus, unspecified arm, subsequent encounter for fracture with routine healing: Secondary | ICD-10-CM | POA: Diagnosis not present

## 2014-07-22 DIAGNOSIS — N183 Chronic kidney disease, stage 3 unspecified: Secondary | ICD-10-CM | POA: Diagnosis not present

## 2014-07-22 DIAGNOSIS — M810 Age-related osteoporosis without current pathological fracture: Secondary | ICD-10-CM | POA: Diagnosis not present

## 2014-07-22 DIAGNOSIS — I4891 Unspecified atrial fibrillation: Secondary | ICD-10-CM | POA: Diagnosis present

## 2014-07-22 DIAGNOSIS — M25519 Pain in unspecified shoulder: Secondary | ICD-10-CM | POA: Diagnosis not present

## 2014-07-22 DIAGNOSIS — S42213A Unspecified displaced fracture of surgical neck of unspecified humerus, initial encounter for closed fracture: Secondary | ICD-10-CM | POA: Diagnosis present

## 2014-07-22 DIAGNOSIS — D509 Iron deficiency anemia, unspecified: Secondary | ICD-10-CM | POA: Diagnosis not present

## 2014-07-22 DIAGNOSIS — Z9181 History of falling: Secondary | ICD-10-CM | POA: Diagnosis not present

## 2014-07-22 DIAGNOSIS — Z23 Encounter for immunization: Secondary | ICD-10-CM | POA: Diagnosis not present

## 2014-07-22 DIAGNOSIS — Z823 Family history of stroke: Secondary | ICD-10-CM | POA: Diagnosis not present

## 2014-07-22 DIAGNOSIS — Z96649 Presence of unspecified artificial hip joint: Secondary | ICD-10-CM | POA: Diagnosis not present

## 2014-07-22 DIAGNOSIS — F411 Generalized anxiety disorder: Secondary | ICD-10-CM | POA: Diagnosis present

## 2014-07-22 DIAGNOSIS — Z950 Presence of cardiac pacemaker: Secondary | ICD-10-CM | POA: Diagnosis not present

## 2014-07-22 DIAGNOSIS — E7800 Pure hypercholesterolemia, unspecified: Secondary | ICD-10-CM | POA: Diagnosis present

## 2014-07-22 DIAGNOSIS — S322XXA Fracture of coccyx, initial encounter for closed fracture: Secondary | ICD-10-CM | POA: Diagnosis present

## 2014-07-22 LAB — TROPONIN I: Troponin I: <0.3 ng/mL (ref <0.30)

## 2014-07-22 MED ORDER — DIPHENHYDRAMINE HCL 50 MG/ML IJ SOLN
12.5000 mg | Freq: Once | INTRAMUSCULAR | Status: AC
  Administered 2014-07-22: 12.5 mg via INTRAVENOUS
  Filled 2014-07-22: qty 1

## 2014-07-22 MED ORDER — INFLUENZA VAC SPLIT QUAD 0.5 ML IM SUSY
0.5000 mL | PREFILLED_SYRINGE | INTRAMUSCULAR | Status: AC
  Administered 2014-07-23: 0.5 mL via INTRAMUSCULAR
  Filled 2014-07-22: qty 0.5

## 2014-07-22 MED ORDER — MECLIZINE HCL 25 MG PO TABS
25.0000 mg | ORAL_TABLET | Freq: Three times a day (TID) | ORAL | Status: DC | PRN
Start: 2014-07-22 — End: 2014-07-25
  Administered 2014-07-22: 25 mg via ORAL
  Filled 2014-07-22 (×3): qty 1

## 2014-07-22 MED ORDER — ALBUTEROL SULFATE (2.5 MG/3ML) 0.083% IN NEBU: 2.5000 mg | INHALATION_SOLUTION | RESPIRATORY_TRACT | Status: DC | PRN

## 2014-07-22 MED ORDER — ONDANSETRON HCL 4 MG/2ML IJ SOLN
4.0000 mg | Freq: Four times a day (QID) | INTRAMUSCULAR | Status: DC | PRN
  Administered 2014-07-22 – 2014-07-23 (×5): 4 mg via INTRAVENOUS
  Filled 2014-07-22 (×5): qty 2

## 2014-07-22 MED ORDER — LORAZEPAM 2 MG/ML IJ SOLN: 1.0000 mg | Freq: Every day | INTRAMUSCULAR | Status: DC | PRN

## 2014-07-22 MED ORDER — ONDANSETRON 4 MG PO TBDP
ORAL_TABLET | ORAL | Status: AC
  Administered 2014-07-22: 02:00:00
  Filled 2014-07-22: qty 1

## 2014-07-22 MED ORDER — PROCHLORPERAZINE EDISYLATE 5 MG/ML IJ SOLN
10.0000 mg | Freq: Four times a day (QID) | INTRAMUSCULAR | Status: DC | PRN
  Filled 2014-07-22: qty 2

## 2014-07-22 MED ORDER — LEVOTHYROXINE SODIUM 100 MCG IV SOLR
37.5000 ug | Freq: Every day | INTRAVENOUS | Status: AC
  Administered 2014-07-23: 37.5 ug via INTRAVENOUS
  Filled 2014-07-22: qty 5

## 2014-07-22 NOTE — Progress Notes (Signed)
Admitted pt from ED [facility].AAOx3 accompanied by daugther . Oriented to call bell and room tele on. 02 2lnc in use. ivf ns @50cc /hr started. Noted bruises on left shoulder, left hip and dorsum of rt foot. Continued to monitor

## 2014-07-22 NOTE — Progress Notes (Signed)
UR completed 

## 2014-07-22 NOTE — Progress Notes (Signed)
[facility] PROGRESS NOTE  Niger M Pretty [id number] DOB: 08/30/1922 DOA: 07/21/2014 PCP: Purvis Kilts, MD  Assessment/Plan: 1. L humeral fracture, R superior pubic rami fracture 1. Cont with analgesics as tolerated 2. PT/OT 3. Orthopedic surgery recs from ED for conservative measures at this time and to cont with pain meds and wt bearing as tolerated 2. CAD 1. Stable 2. Trop neg x1 on 9/21 3. As pt is nauseated today, will follow serial enzymes 3. HTN 1. Stable and controlled 4. Asthma 1. Stable. No wheezing 5. CKD 3 1. Cr remains near baseline 6. Nausea 1. Follow serial enzymes per above 2. Cont with zofran/compazine as tolerated 7. DVT prophylaxis 1. Heparin subQ  Code Status: No intubation, but CPR OK Family Communication: Pt and daughter in room Disposition Plan: Likely will need SNF   Consultants:    Procedures:    Antibiotics:   (indicate start date, and stop date if known)  HPI/Subjective: Complains of nausea/vomiting. Still in marked pain  Objective: Filed Vitals:   07/22/14 0243 07/22/14 0658 07/22/14 1038 07/22/14 1331  BP: 121/47 95/55 103/56 128/48  Pulse: 75 70 70 70  Temp: 97.4 F (36.3 C) 97.5 F (36.4 C)  97.5 F (36.4 C)  TempSrc: Oral Oral  Axillary  Resp: 15 15  18   Height: 5\' 5"  (1.651 m)     Weight: 72.4 kg (159 lb 9.8 oz)     SpO2: 96% 98%  100%    Intake/Output Summary (Last 24 hours) at 07/22/14 1634 Last data filed at 07/22/14 0933  Gross per 24 hour  Intake      0 ml  Output      0 ml  Net      0 ml   Filed Weights   07/21/14 1641 07/22/14 0243  Weight: 70.308 kg (155 lb) 72.4 kg (159 lb 9.8 oz)    Exam:   General:  Awake, in nad  Cardiovascular: regular, s1, s2  Respiratory: normal resp effort, no wheezing  Abdomen: soft,nondistended  Musculoskeletal: perfused, no clubbing   Data Reviewed: Basic Metabolic Panel:  Recent Labs Lab 07/21/14 1913  NA 138  K 4.2  CL 99  CO2 24   GLUCOSE 122*  BUN 18  CREATININE 1.58*  CALCIUM 9.6   Liver Function Tests: No results found for this basename: AST, ALT, ALKPHOS, BILITOT, PROT, ALBUMIN,  in the last 168 hours No results found for this basename: LIPASE, AMYLASE,  in the last 168 hours No results found for this basename: AMMONIA,  in the last 168 hours CBC:  Recent Labs Lab 07/21/14 1913  WBC 17.3*  NEUTROABS 14.6*  HGB 12.6  HCT 38.2  MCV 89.3  PLT 187   Cardiac Enzymes:  Recent Labs Lab 07/21/14 1913  TROPONINI <0.30   BNP (last 3 results)  Recent Labs  04/15/14 1621  PROBNP 110.5   CBG: No results found for this basename: GLUCAP,  in the last 168 hours  No results found for this or any previous visit (from the past 240 hour(s)).   Studies: Dg Lumbar Spine Complete  07/21/2014   CLINICAL DATA:  Status post fall in the bathroom with low back and left hip pain; known left humeral head fracture.  EXAM: LUMBAR SPINE - COMPLETE 4+ VIEW  COMPARISON:  None.  FINDINGS: The lumbar vertebral bodies are diffusely osteopenic but are preserved in height. There is no spondylolisthesis. The disc space heights are well maintained. There is facet joint hypertrophy from L3-4  through L5-S1. The pedicles and transverse processes are intact where visualized. The observed portions of the sacrum are normal.  IMPRESSION: There is no acute lumbar spine fracture or dislocation. There are degenerative facet joint changes.   Electronically Signed   By: David  Martinique   On: 07/21/2014 18:11   Dg Pelvis 1-2 Views  07/21/2014   CLINICAL DATA:  Status post fall in the bathroom now with left-sided hip pain ; known fractures of the left shoulder.  EXAM: PELVIS - 1-2 VIEW  COMPARISON:  Hip series of December 02, 2012  FINDINGS: There is new lucency and irregularity within the midportion of the inferior pubic ramus on the left. This is worrisome for an acute fracture. The superior pubic ramus is intact. There are prosthetic hip joints  bilaterally which appear intact. The iliac bones are intact. The observed portions of the sacrum are normal.  IMPRESSION: There is likely an impacted fracture of the inferior pubic ramus on the left.   Electronically Signed   By: David  Martinique   On: 07/21/2014 18:09   Dg Femur Left  07/21/2014   CLINICAL DATA:  Status post fall in the bathroom with known acute fractures of the shoulder. Left hip and femur pain  EXAM: LEFT FEMUR - 2 VIEW  COMPARISON:  AP pelvis of today's date  FINDINGS: There is a prosthetic left hip joint. The interface with the native bone appears normal. The positioning of the prosthetic femoral head within the native acetabulum is normal. The femoral shaft is normal in appearance. The observed portions of the knee exhibit no acute abnormalities. There is new irregularity of the inferior pubic ramus.  IMPRESSION: There is no evidence of an acute fracture of the left femur. There is likely a nondisplaced fracture of the inferior pubic ramus on the left.   Electronically Signed   By: David  Martinique   On: 07/21/2014 18:08   Ct Head Wo Contrast  07/21/2014   CLINICAL DATA:  [place] today, left shoulder pain  EXAM: CT HEAD WITHOUT CONTRAST  CT CERVICAL SPINE WITHOUT CONTRAST  TECHNIQUE: Multidetector CT imaging of the head and cervical spine was performed following the standard protocol without intravenous contrast. Multiplanar CT image reconstructions of the cervical spine were also generated.  COMPARISON:  08/10/2012, 08/07/2012  FINDINGS: CT HEAD FINDINGS  Study limited by motion artifact. Diffuse atrophy. Mild low attenuation in the deep white matter. No evidence of vascular territory infarct, hemorrhage, extra-axial fluid, or mass. No skull fracture.  CT CERVICAL SPINE FINDINGS  Normal alignment with no fracture. Multilevel degenerative disc disease. No paraspinous hematoma. Mild pleural and parenchymal scarring at the lung apices. Extensive bilateral carotid calcification.  IMPRESSION: No  acute abnormalities involving the cervical spine.  Evaluation of the head limited by motion artifact, but grossly negative.   Electronically Signed   By: Skipper Cliche M.D.   On: 07/21/2014 20:19   Ct Cervical Spine Wo Contrast  07/21/2014   CLINICAL DATA:  [place] today, left shoulder pain  EXAM: CT HEAD WITHOUT CONTRAST  CT CERVICAL SPINE WITHOUT CONTRAST  TECHNIQUE: Multidetector CT imaging of the head and cervical spine was performed following the standard protocol without intravenous contrast. Multiplanar CT image reconstructions of the cervical spine were also generated.  COMPARISON:  08/10/2012, 08/07/2012  FINDINGS: CT HEAD FINDINGS  Study limited by motion artifact. Diffuse atrophy. Mild low attenuation in the deep white matter. No evidence of vascular territory infarct, hemorrhage, extra-axial fluid, or mass. No skull fracture.  CT CERVICAL SPINE FINDINGS  Normal alignment with no fracture. Multilevel degenerative disc disease. No paraspinous hematoma. Mild pleural and parenchymal scarring at the lung apices. Extensive bilateral carotid calcification.  IMPRESSION: No acute abnormalities involving the cervical spine.  Evaluation of the head limited by motion artifact, but grossly negative.   Electronically Signed   By: Skipper Cliche M.D.   On: 07/21/2014 20:19   Ct Pelvis Wo Contrast  07/21/2014   CLINICAL DATA:  Fall, left hip pain, status post bilateral hip replacement  EXAM: CT PELVIS WITHOUT CONTRAST  TECHNIQUE: Multidetector CT imaging of the pelvis was performed following the standard protocol without intravenous contrast.  COMPARISON:  CT abdomen pelvis dated 12/11/2012.  FINDINGS: Status post bilateral total hip arthroplasty.  No evidence of loosening or hardware complication.  Nondisplaced fractures involving the left sacrum (series 5/ image 35), left inferior pubic ramus (series 9/ image 60), and right superior pubic ramus/ parasymphyseal region (series 5/ image 87).  Soft tissue pelvis is  markedly obscured by streak artifact from the patient's bilateral hip prostheses.  Visualized portions of the uterus and bladder are unremarkable.  Vascular calcifications.  No pelvic ascites.  IMPRESSION: Status post bilateral total hip arthroplasty, without evidence of complication.  Nondisplaced fractures involving the left sacrum, left inferior pubic ramus, and right superior pubic ramus/parasymphyseal region.   Electronically Signed   By: Julian Hy M.D.   On: 07/21/2014 20:17   Dg Chest Portable 1 View  07/21/2014   CLINICAL DATA:  Fall  EXAM: PORTABLE CHEST - 1 VIEW  COMPARISON:  04/15/2014  FINDINGS: Chronic interstitial markings/emphysematous changes. No focal consolidation. No pleural effusion or pneumothorax.  The heart is normal in size. Postsurgical changes related to prior CABG. Left subclavian pacemaker.  IMPRESSION: No evidence of acute cardiopulmonary disease.   Electronically Signed   By: Julian Hy M.D.   On: 07/21/2014 21:47   Dg Shoulder Left  07/22/2014   CLINICAL DATA:  Pain, fall  EXAM: LEFT SHOULDER - 2+ VIEW  COMPARISON:  07/21/2014  FINDINGS: Osseous demineralization.  AC joint alignment normal.  Comminuted mildly displaced proximal LEFT humeral fracture.  Underlying glenohumeral degenerative changes.  No dislocation evident on AP exam ; no evidence of dislocation on the preceding scapular Y-view of 07/21/2014.  Visualized LEFT ribs intact.  Atherosclerotic calcification aorta.  Post pacemaker and CABG.  IMPRESSION: Comminuted displaced proximal LEFT humeral fracture.  Osseous demineralization.   Electronically Signed   By: Lavonia Dana M.D.   On: 07/22/2014 13:51   Dg Shoulder Left  07/21/2014   CLINICAL DATA:  Status post fall in the bathroom now complaining of severe left shoulder pain. ; history of previous MI, asthma, and pacemaker placement. Also chronic renal insufficiency.  EXAM: LEFT SHOULDER - 2+ VIEW  COMPARISON:  Left shoulder series of October 9th 2013   FINDINGS: The patient has sustained an acute angulated comminuted fracture of the left humeral head and neck. There is mild avulsion of the greater tuberosity. The observed portions of the scapula and left clavicle are intact. The observed left ribs are normal.  IMPRESSION: The patient has sustained an acute angulated fracture of the left humeral head and neck involving the greater tuberosity also.   Electronically Signed   By: David  Martinique   On: 07/21/2014 18:05   Dg Humerus Left  07/21/2014   CLINICAL DATA:  Status post fall in bathroom on left shoulder ; known post traumatic humeral head fracture  EXAM: LEFT HUMERUS -  2+ VIEW  COMPARISON:  Left shoulder series of today's date  FINDINGS: There is a known fracture involving the humeral head and neck. The humeral shaft is intact. The observed portion of the distal humerus is normal. The overlying soft tissues are unremarkable.  IMPRESSION: There is no humeral shaft fracture. There are post traumatic fractures of the humeral head and neck.   Electronically Signed   By: David  Martinique   On: 07/21/2014 18:06   Dg Foot Complete Right  07/22/2014   CLINICAL DATA:  Dorsal right foot pain which began earlier this more node (acute).  EXAM: RIGHT FOOT COMPLETE - 3+ VIEW  COMPARISON:  08/09/2012, 08/06/2011.  FINDINGS: Osseous demineralization. No evidence of acute fracture or dislocation. Previously identified fracture involving the base of the 5th metacarpal and 2012 has completely healed. Narrowing of IP joint spaces in all of the toes. Remaining joint spaces well preserved. No erosions. Very small plantar calcaneal spur.  IMPRESSION: Osseous demineralization. No acute osseous abnormality. 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   Electronically Signed   By: Evangeline Dakin M.D.   On: 07/22/2014 13:52    Scheduled Meds: . aspirin EC  81 mg Oral Daily  . furosemide  20 mg Oral Daily  . heparin  5,000 Units Subcutaneous 3 times per day  . [START ON  07/23/2014] Influenza vac split quadrivalent PF  0.5 mL Intramuscular Tomorrow-1000  . [START ON 07/23/2014] levothyroxine  37.5 mcg Intravenous Daily  . losartan  50 mg Oral Daily  . metoprolol succinate  25 mg Oral Daily  . mometasone-formoterol  2 puff Inhalation BID  . pantoprazole  40 mg Oral Daily  . potassium chloride  10 mEq Oral Daily  . sodium chloride  3 mL Intravenous Q12H   Continuous Infusions: . sodium chloride 50 mL/hr at 07/22/14 [id number]    Active Problems:   Fall   Fracture of humeral head, left, closed   Multiple pelvic fractures  Time spent: 31min  Durrell Barajas, [facility] Pager [phone]. If 7PM-7AM, please contact night-coverage at [email or web address], password [facility] 07/22/2014, 4:34 PM  LOS: 1 day

## 2014-07-22 NOTE — Progress Notes (Signed)
PT Cancellation Note  Patient Details Name: Amy [name] MRN: [id number] DOB: 1921/12/12   Cancelled Treatment:    Reason Eval Not Completed: Patient not medically ready. Noted pt with multiple fractures of pelvis and Rt hip and awaiting ortho consult. Will await their recommendations.   [name] 07/22/2014, 10:41 AM Pager [phone]

## 2014-07-22 NOTE — Progress Notes (Signed)
Paged IV team to place new IV on pt at her request.

## 2014-07-23 ENCOUNTER — Inpatient Hospital Stay (HOSPITAL_COMMUNITY): Payer: Medicare Other

## 2014-07-23 DIAGNOSIS — N183 Chronic kidney disease, stage 3 unspecified: Secondary | ICD-10-CM

## 2014-07-23 DIAGNOSIS — S42213A Unspecified displaced fracture of surgical neck of unspecified humerus, initial encounter for closed fracture: Secondary | ICD-10-CM | POA: Diagnosis not present

## 2014-07-23 LAB — COMPREHENSIVE METABOLIC PANEL
ALK PHOS: 71 U/L (ref 39–117)
ALT: 10 U/L (ref 0–35)
ANION GAP: 11 (ref 5–15)
AST: 16 U/L (ref 0–37)
Albumin: 2.8 g/dL — ABNORMAL LOW (ref 3.5–5.2)
BUN: 38 mg/dL — ABNORMAL HIGH (ref 6–23)
CHLORIDE: 100 meq/L (ref 96–112)
CO2: 24 mEq/L (ref 19–32)
Calcium: 8.3 mg/dL — ABNORMAL LOW (ref 8.4–10.5)
Creatinine, Ser: 2.47 mg/dL — ABNORMAL HIGH (ref 0.50–1.10)
GFR, EST AFRICAN AMERICAN: 19 mL/min — AB (ref >90)
GFR, EST NON AFRICAN AMERICAN: 16 mL/min — AB (ref >90)
GLUCOSE: 116 mg/dL — AB (ref 70–99)
POTASSIUM: 5 meq/L (ref 3.7–5.3)
Sodium: 135 mEq/L — ABNORMAL LOW (ref 137–147)
Total Bilirubin: 0.4 mg/dL (ref 0.3–1.2)
Total Protein: 5.6 g/dL — ABNORMAL LOW (ref 6.0–8.3)

## 2014-07-23 LAB — CBC WITH DIFFERENTIAL/PLATELET
BASOS ABS: 0 10*3/uL (ref 0.0–0.1)
BASOS PCT: 0 % (ref 0–1)
EOS ABS: 0.2 10*3/uL (ref 0.0–0.7)
Eosinophils Relative: 3 % (ref 0–5)
HCT: 28.4 % — ABNORMAL LOW (ref 36.0–46.0)
Hemoglobin: 9.4 g/dL — ABNORMAL LOW (ref 12.0–15.0)
Lymphocytes Relative: 13 % (ref 12–46)
Lymphs Abs: 1 10*3/uL (ref 0.7–4.0)
MCH: 30.1 pg (ref 26.0–34.0)
MCHC: 33.1 g/dL (ref 30.0–36.0)
MCV: 91 fL (ref 78.0–100.0)
Monocytes Absolute: 0.5 10*3/uL (ref 0.1–1.0)
Monocytes Relative: 7 % (ref 3–12)
NEUTROS PCT: 77 % (ref 43–77)
Neutro Abs: 6.1 10*3/uL (ref 1.7–7.7)
Platelets: 132 10*3/uL — ABNORMAL LOW (ref 150–400)
RBC: 3.12 MIL/uL — ABNORMAL LOW (ref 3.87–5.11)
RDW: 14.1 % (ref 11.5–15.5)
WBC: 7.9 10*3/uL (ref 4.0–10.5)

## 2014-07-23 LAB — TROPONIN I
Troponin I: <0.3 ng/mL (ref <0.30)
Troponin I: <0.3 ng/mL (ref <0.30)

## 2014-07-23 MED ORDER — LORAZEPAM 2 MG/ML IJ SOLN: 0.5000 mg | Freq: Every day | INTRAMUSCULAR | Status: DC | PRN

## 2014-07-23 MED ORDER — HYDROMORPHONE HCL 1 MG/ML IJ SOLN
0.5000 mg | INTRAMUSCULAR | Status: DC | PRN
  Administered 2014-07-23: 0.5 mg via INTRAVENOUS
  Filled 2014-07-23: qty 1

## 2014-07-23 MED ORDER — ACETAMINOPHEN 500 MG PO TABS
1000.0000 mg | ORAL_TABLET | Freq: Two times a day (BID) | ORAL | Status: DC
Start: 2014-07-23 — End: 2014-07-25
  Administered 2014-07-23 – 2014-07-25 (×4): 1000 mg via ORAL
  Filled 2014-07-23 (×6): qty 2

## 2014-07-23 MED ORDER — LEVOTHYROXINE SODIUM 75 MCG PO TABS
75.0000 ug | ORAL_TABLET | Freq: Every day | ORAL | Status: DC
  Administered 2014-07-24 – 2014-07-25 (×2): 75 ug via ORAL
  Filled 2014-07-23 (×3): qty 1

## 2014-07-23 MED ORDER — HYDROMORPHONE HCL 1 MG/ML IJ SOLN
0.5000 mg | INTRAMUSCULAR | Status: DC | PRN
  Administered 2014-07-24: 1 mg via INTRAVENOUS
  Administered 2014-07-24 – 2014-07-25 (×3): 0.5 mg via INTRAVENOUS
  Filled 2014-07-23 (×4): qty 1

## 2014-07-23 MED ORDER — LOSARTAN POTASSIUM 25 MG PO TABS: 25.0000 mg | ORAL_TABLET | Freq: Every day | ORAL | Status: DC

## 2014-07-23 MED ORDER — SODIUM CHLORIDE 0.9 % IV BOLUS (SEPSIS)
500.0000 mL | Freq: Once | INTRAVENOUS | Status: AC
  Administered 2014-07-23: 500 mL via INTRAVENOUS

## 2014-07-23 MED ORDER — SODIUM CHLORIDE 0.9 % IV BOLUS (SEPSIS)
500.0000 mL | Freq: Once | INTRAVENOUS | Status: AC
  Administered 2014-07-23: 21:00:00 via INTRAVENOUS

## 2014-07-23 NOTE — Progress Notes (Signed)
Patient had low blood pressure around 2000 96/30. MD Baltazar Najjar was paged and 500 cc bolus was hung. Patient was given dilaudid for pain 0.5mg . Blood pressure after bolus was still low 87/26. MD Baltazar Najjar was text paged. Awaiting response from MD, rapid response nurse came to look at patient. Patient is lethargic but able to wake up and answer questions. Will await the MD to call and keep monitoring patients.

## 2014-07-23 NOTE — Progress Notes (Signed)
500 cc bolus ordered along with labs and and chest xray. Will continue to monitor patient

## 2014-07-23 NOTE — Progress Notes (Signed)
Paged to notify physician that at 1115, pt's BP 110/34, HR 83.  Pt is asymptomatic. Cozaar 50mg  PO daily and Toprol-XL 25mg  daily were held r/t to low BP.   At 1733, pt BP 109/38, HR 87.  Pt remains asymptomatic.  Pt's O2 sats decreased to 87% on 2L [place], therefore, I increased O2 to 4L [place].  Physician is aware and will D/C the Cozaar orders, but we will continue to admin the Toprol-XL.

## 2014-07-23 NOTE — Clinical Social Work Psychosocial (Addendum)
     Clinical Social Work Department BRIEF PSYCHOSOCIAL ASSESSMENT 07/23/2014  Patient:  Amy [name], Amy [name]     Account Number:  [id number]     Admit date:  07/21/2014  Clinical Social Worker:  Glendon Axe, CLINICAL SOCIAL WORKER  Date/Time:  07/23/2014 03:16 PM  Referred by:  Physician  Date Referred:  07/23/2014 Referred for  SNF Placement   Other Referral:   PT recommended SNF placement.   Interview type:  Other - See comment Other interview type:   CSW spoke with pt and pt's daughter at bedside.    PSYCHOSOCIAL DATA Living Status:  ALONE Admitted from facility:   Level of care:   Primary support name:  Francie Massing Primary support relationship to patient:  CHILD, ADULT Degree of support available:   Strong    CURRENT CONCERNS Current Concerns  Post-Acute Placement   Other Concerns:    SOCIAL WORK ASSESSMENT / PLAN Clinical Social Worker spoke with pt and pt's daughter in reference to SNF placement. CSW provided pt's daughter with SNF list and explained the SNF process. Pt's daughter explained pt was at [facility] twice in the past and pt reported that she was very pleased with the service. Pt also stated that her husband was at [facility] in the past as well. CSW to follow up with pt and pt's daughter to provide continued support and facilitate pt's discharge needs.   Assessment/plan status:  Psychosocial Support/Ongoing Assessment of Needs Other assessment/ plan:   Information/referral to community resources:   Provided pt and pt's daughter with SNF list.    PATIENTS/FAMILYS RESPONSE TO PLAN OF CARE: Pt laying in bed with daughter at bedside. Pt alert/oriented and friendly. Pt agreed with plan for SNF placement and would like to return to [facility] if possible. Pt stated that she was pleased with [facility] service delivery. Pt's daughter also agreed and was relieved that SNF was recommended versus home health. Pt's daughter stated she would be visiting pt on a daily basis  and looks forward to pt recovering and returning back home. CSW remains available as needed/support.

## 2014-07-23 NOTE — Evaluation (Signed)
Physical Therapy Evaluation Patient Details Name: Amy [name] MRN: [id number] DOB: May 11, 1922 Today's Date: 07/23/2014   History of Present Illness  Pt admitted after falling at home sustained a L humeral fx, R superior ramus fracture and L inferior ramus fracture.  Clinical Impression  Pt admitted with the above. Pt currently with functional limitations due to the deficits listed below (see PT Problem List). At the time of PT eval pt was able to perform supine<>sit with mod-max assist +2. Overall did well sitting EOB on first time up. Pt independent PTA however daughter states she is available for supervision for ADL's on a daily basis. Pt states she wishes to d/c to rehab prior to returning home. Pt will benefit from skilled PT to increase their independence and safety with mobility to allow discharge to the venue listed below.       Follow Up Recommendations SNF;Supervision/Assistance - 24 hour    Equipment Recommendations  Rolling walker with 5" wheels;3in1 (PT)    Recommendations for Other Services       Precautions / Restrictions Precautions Precautions: Fall Required Braces or Orthoses: Sling Restrictions Weight Bearing Restrictions: Yes LUE Weight Bearing: Non weight bearing RLE Weight Bearing: Weight bearing as tolerated LLE Weight Bearing: Weight bearing as tolerated      Mobility  Bed Mobility Overal bed mobility: +2 for physical assistance;Needs Assistance Bed Mobility: Supine to Sit;Sit to Supine     Supine to sit: +2 for physical assistance;Mod assist Sit to supine: +2 for physical assistance;Max assist   General bed mobility comments: Pt was able to assist in moving LE's to EOB, with therapists using bed pad for scooting. Pt used RUE to assist in pulling up to sit from therapist's arm as well as using bed rail.   Transfers                 General transfer comment: Deferred due to pain  Ambulation/Gait                Stairs             Wheelchair Mobility    Modified Rankin (Stroke Patients Only)       Balance Overall balance assessment: History of Falls;Needs assistance Sitting-balance support: Feet supported;Single extremity supported Sitting balance-Leahy Scale: Poor Sitting balance - Comments: Requires assist at first to maintain sitting balance. Pt then able to maintain sitting EOB without assist ~10 minutes. As pt fatigues, assist again required to maintain upright posture.  Postural control: Posterior lean                                   Pertinent Vitals/Pain Pain Assessment: 0-10 Pain Score: 6  (4 at rest, increases with movement) Pain Location: L hip, L shoulder with movement Pain Intervention(s): Premedicated before session;Monitored during session    [place] expects to be discharged to:: Skilled nursing facility Living Arrangements: Alone               Additional Comments: 6 steps to get inside at home.    Prior Function Level of Independence: Independent with assistive device(s)               Hand Dominance   Dominant Hand: Right    Extremity/Trunk Assessment   Upper Extremity Assessment: Defer to OT evaluation       LUE Deficits / Details: L shoulder immobilized in sling, elbow to hand  with full AROM   Lower Extremity Assessment: RLE deficits/detail;LLE deficits/detail RLE Deficits / Details: Limited by pain, able to perform active movement with increased time and guarded positioning.  LLE Deficits / Details: Limited by pain. Pt able to perform little active movement, and requires min-mod assist for lateral movement when transitioning to EOB.  Cervical / Trunk Assessment: Normal  Communication   Communication: No difficulties  Cognition Arousal/Alertness: Awake/alert Behavior During Therapy: Anxious Overall Cognitive Status: Within Functional Limits for tasks assessed                      General Comments       Exercises        Assessment/Plan    PT Assessment Patient needs continued PT services  PT Diagnosis Difficulty walking;Acute pain   PT Problem List Decreased strength;Decreased range of motion;Decreased activity tolerance;Decreased mobility;Decreased balance;Decreased knowledge of use of DME;Decreased safety awareness;Decreased knowledge of precautions;Cardiopulmonary status limiting activity;Pain  PT Treatment Interventions DME instruction;Gait training;Stair training;Functional mobility training;Therapeutic activities;Therapeutic exercise;Neuromuscular re-education;Patient/family education   PT Goals (Current goals can be found in the Care Plan section) Acute Rehab PT Goals Patient Stated Goal: SNF for rehab PT Goal Formulation: With patient/family Time For Goal Achievement: 08/06/14 Potential to Achieve Goals: Good    Frequency Min 2X/week   Barriers to discharge Decreased caregiver support Pt lives alone    Co-evaluation PT/OT/SLP Co-Evaluation/Treatment: Yes Reason for Co-Treatment: For patient/therapist safety;Complexity of the patient's impairments (multi-system involvement) PT goals addressed during session: Mobility/safety with mobility;Balance OT goals addressed during session: ADL's and self-care       End of Session Equipment Utilized During Treatment: Oxygen Activity Tolerance: Patient limited by pain Patient left: in bed;with call bell/phone within reach;with family/visitor present Nurse Communication: Mobility status         Time: [id number] PT Time Calculation (min): 43 min   Charges:   PT Evaluation $Initial PT Evaluation Tier I: 1 Procedure PT Treatments $Therapeutic Activity: 23-37 mins   PT G CodesJolyn Lent 07/23/2014, 11:32 AM  Jolyn Lent, PT, DPT Acute Rehabilitation Services Pager: [phone]

## 2014-07-23 NOTE — Progress Notes (Signed)
[facility] PROGRESS NOTE  Amy [name] [id number] DOB: 04/06/1922 DOA: 07/21/2014 PCP: Purvis Kilts, MD  Assessment/Plan: 1. L humeral fracture, R superior pubic rami fracture 1. Cont with analgesics regimen as needed; will add schedule tylenol in order to help with pain and minimizes use of narcotics 2. PT/OT, has seen patient and recommended SNF 3. Orthopedic surgery recs from ED for conservative measures at this time and to cont with pain meds and wt bearing as tolerated; no surgery 2. CAD 1. Stable 2. Trop neg x3; no abnormalities on telemetry 3. Patient denies CP 3. HTN 1. Soft to mild hypotension 2. Will hold cozaar and lasix 3. Will follow BP 4. Asthma 1. Stable. No wheezing 2. Continue dulera BID and PRN albuterol 5. Acute on CKD: stage 3 at baseline 1. Secondary to hypotension and continue use of cozaar/lasix 2. Will give IVF's 3. Hold nephrotoxic agents 6. Nausea 1. No further N/V episodes. 2. Cont with zofran/compazine as needed 3. Patient nausea most likely associated with pain meds 7. Leukocytosis: due to demargination. Resolved with IV fluids. 8. DVT prophylaxis 1. Heparin subQ  Code Status: No intubation, but CPR OK Family Communication: Pt and daughter in room Disposition Plan: will need SNF at discharge   Consultants:  Orthopedics service (consulted by ED physician)  Procedures:  None   Antibiotics:   (indicate start date, and stop date if known)  HPI/Subjective: No further nausea, or vomiting. Patient is slightly somnolent but easily aroused and oriented X 3.   Objective: Filed Vitals:   07/23/14 0619 07/23/14 1115 07/23/14 1733 07/23/14 2008  BP:  110/34 109/38 96/30  Pulse: 94 83 87 81  Temp: 98.3 F (36.8 C)  99.2 F (37.3 C) 98.7 F (37.1 C)  TempSrc: Oral  Oral Oral  Resp: 18     Height:      Weight: 74.8 kg (164 lb 14.5 oz)     SpO2: 100%  98% 97%    Intake/Output Summary (Last 24 hours) at 07/23/14  2140 Last data filed at 07/23/14 1747  Gross per 24 hour  Intake 1619.17 ml  Output    100 ml  Net 1519.17 ml   Filed Weights   07/21/14 1641 07/22/14 0243 07/23/14 0619  Weight: 70.308 kg (155 lb) 72.4 kg (159 lb 9.8 oz) 74.8 kg (164 lb 14.5 oz)    Exam:   General:  Awake, in no distress; slightly somnolent  Cardiovascular: regular, s1, s2, positive SEM  Respiratory: normal resp effort, no wheezing  Abdomen: soft, nondistended  Musculoskeletal: perfused, no clubbing   Data Reviewed: Basic Metabolic Panel:  Recent Labs Lab 07/21/14 1913 07/23/14 0630  NA 138 135*  K 4.2 5.0  CL 99 100  CO2 24 24  GLUCOSE 122* 116*  BUN 18 38*  CREATININE 1.58* 2.47*  CALCIUM 9.6 8.3*   Liver Function Tests:  Recent Labs Lab 07/23/14 0630  AST 16  ALT 10  ALKPHOS 71  BILITOT 0.4  PROT 5.6*  ALBUMIN 2.8*   CBC:  Recent Labs Lab 07/21/14 1913 07/23/14 0630  WBC 17.3* 7.9  NEUTROABS 14.6* 6.1  HGB 12.6 9.4*  HCT 38.2 28.4*  MCV 89.3 91.0  PLT 187 132*   Cardiac Enzymes:  Recent Labs Lab 07/21/14 1913 07/22/14 1916 07/23/14 0115 07/23/14 0900  TROPONINI <0.30 <0.30 <0.30 <0.30   BNP (last 3 results)  Recent Labs  04/15/14 1621  PROBNP 110.5    Studies: Dg Chest Portable 1 View  07/21/2014  CLINICAL DATA:  Fall  EXAM: PORTABLE CHEST - 1 VIEW  COMPARISON:  04/15/2014  FINDINGS: Chronic interstitial markings/emphysematous changes. No focal consolidation. No pleural effusion or pneumothorax.  The heart is normal in size. Postsurgical changes related to prior CABG. Left subclavian pacemaker.  IMPRESSION: No evidence of acute cardiopulmonary disease.   Electronically Signed   By: Julian Hy M.D.   On: 07/21/2014 21:47   Dg Shoulder Left  07/22/2014   CLINICAL DATA:  Pain, fall  EXAM: LEFT SHOULDER - 2+ VIEW  COMPARISON:  07/21/2014  FINDINGS: Osseous demineralization.  AC joint alignment normal.  Comminuted mildly displaced proximal LEFT humeral  fracture.  Underlying glenohumeral degenerative changes.  No dislocation evident on AP exam ; no evidence of dislocation on the preceding scapular Y-view of 07/21/2014.  Visualized LEFT ribs intact.  Atherosclerotic calcification aorta.  Post pacemaker and CABG.  IMPRESSION: Comminuted displaced proximal LEFT humeral fracture.  Osseous demineralization.   Electronically Signed   By: Lavonia Dana M.D.   On: 07/22/2014 13:51   Dg Foot Complete Right  07/22/2014   CLINICAL DATA:  Dorsal right foot pain which began earlier this more node (acute).  EXAM: RIGHT FOOT COMPLETE - 3+ VIEW  COMPARISON:  08/09/2012, 08/06/2011.  FINDINGS: Osseous demineralization. No evidence of acute fracture or dislocation. Previously identified fracture involving the base of the 5th metacarpal and 2012 has completely healed. Narrowing of IP joint spaces in all of the toes. Remaining joint spaces well preserved. No erosions. Very small plantar calcaneal spur.  IMPRESSION: Osseous demineralization. No acute osseous abnormality. 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   Electronically Signed   By: Evangeline Dakin M.D.   On: 07/22/2014 13:52    Scheduled Meds: . aspirin EC  81 mg Oral Daily  . furosemide  20 mg Oral Daily  . heparin  5,000 Units Subcutaneous 3 times per day  . [START ON 07/24/2014] levothyroxine  75 mcg Oral QAC breakfast  . metoprolol succinate  25 mg Oral Daily  . mometasone-formoterol  2 puff Inhalation BID  . pantoprazole  40 mg Oral Daily  . potassium chloride  10 mEq Oral Daily  . sodium chloride  3 mL Intravenous Q12H   Continuous Infusions: . sodium chloride 75 mL/hr at 07/23/14 2139    Active Problems:   Fall   Fracture of humeral head, left, closed   Multiple pelvic fractures  Time spent: 40min  [name], [facility] Pager [phone]. If 7PM-7AM, please contact night-coverage at [email or web address], password [facility] 07/23/2014, 9:40 PM  LOS: 2 days

## 2014-07-23 NOTE — Evaluation (Signed)
Occupational Therapy Evaluation Patient Details Name: Amy [name] MRN: [id number] DOB: [birth year] Today's Date: 07/23/2014    History of Present Illness Pt admitted after falling at home sustained a L humeral fx, R superior ramus fracture and L inferior ramus fracture.   Clinical Impression   Pt was living alone, ambulating with a RW and caring for herself with intermittent assist of her daughter prior to this fall.  Pt now requires +2 assist for bed mobility.  She was able to sit EOB with supervision today and perform oral care with min assist.  Pt is limited by pain and anxiety.  She was noted to desat to 84% on RW, 02 returned and encouraged use of incentive spirometer.  Pt will need SNF upon d/c, will follow acutely.    Follow Up Recommendations  SNF;Supervision/Assistance - 24 hour    Equipment Recommendations       Recommendations for Other Services       Precautions / Restrictions Precautions Precautions: Fall Required Braces or Orthoses: Sling Restrictions Weight Bearing Restrictions: Yes LUE Weight Bearing: Non weight bearing RLE Weight Bearing: Weight bearing as tolerated LLE Weight Bearing: Weight bearing as tolerated      Mobility Bed Mobility Overal bed mobility: +2 for physical assistance;Needs Assistance Bed Mobility: Supine to Sit;Sit to Supine     Supine to sit: +2 for physical assistance;Mod assist Sit to supine: +2 for physical assistance;Max assist   General bed mobility comments: pt assisted with moving LEs to EOB, bringing up trunk and using bed rail with R UE  Transfers                      Balance                                            ADL Overall ADL's : Needs assistance/impaired Eating/Feeding: Set up;Bed level   Grooming: Oral care;Set up;Sitting;Brushing hair;Maximal assistance   Upper Body Bathing: Maximal assistance;Sitting   Lower Body Bathing: Total assistance;Bed level   Upper Body Dressing :  Maximal assistance;Sitting   Lower Body Dressing: Total assistance;Bed level                       Vision                     Perception     Praxis      Pertinent Vitals/Pain Pain Assessment: 0-10 Pain Score: 6  Pain Location: L hip, L shoulder with movement Pain Intervention(s): Premedicated before session;Monitored during session;Repositioned;Limited activity within patient's tolerance     Hand Dominance Right   Extremity/Trunk Assessment Upper Extremity Assessment Upper Extremity Assessment: LUE deficits/detail LUE Deficits / Details: L shoulder immobilized in sling, elbow to hand with full AROM LUE: Unable to fully assess due to pain;Unable to fully assess due to immobilization LUE Coordination: decreased gross motor   Lower Extremity Assessment Lower Extremity Assessment: Defer to PT evaluation       Communication Communication Communication: No difficulties   Cognition Arousal/Alertness: Awake/alert Behavior During Therapy: Anxious Overall Cognitive Status: Within Functional Limits for tasks assessed                     General Comments       Exercises       Shoulder Instructions  Home Living Family/patient expects to be discharged to:: Skilled nursing facility Living Arrangements: Alone                               Additional Comments: 6 steps to get inside at home.      Prior Functioning/Environment Level of Independence: Independent with assistive device(s)             OT Diagnosis: Generalized weakness;Acute pain   OT Problem List: Decreased strength;Decreased activity tolerance;Impaired balance (sitting and/or standing);Decreased coordination;Decreased knowledge of use of DME or AE;Decreased knowledge of precautions;Impaired UE functional use;Pain;Increased edema   OT Treatment/Interventions: Self-care/ADL training;DME and/or AE instruction;Therapeutic exercise;Patient/family education;Balance  training    OT Goals(Current goals can be found in the care plan section) Acute Rehab OT Goals Patient Stated Goal: SNF for rehab OT Goal Formulation: With patient Time For Goal Achievement: 08/06/14 Potential to Achieve Goals: Good ADL Goals Pt Will Perform Grooming: with supervision;sitting (3 activities) Pt/caregiver will Perform Home Exercise Program: Left upper extremity;With minimal assist (elbow to hand AROM) Additional ADL Goal #1: Pt will perform bed mobility with max assist in preparation for ADL at EOB.  OT Frequency: Min 2X/week   Barriers to D/C:            Co-evaluation PT/OT/SLP Co-Evaluation/Treatment: Yes Reason for Co-Treatment: For patient/therapist safety   OT goals addressed during session: ADL's and self-care      End of Session    Activity Tolerance: Patient limited by pain Patient left: in bed;with call bell/phone within reach;with family/visitor present   Time: 1010-1057 OT Time Calculation (min): 47 min Charges:  OT General Charges $OT Visit: 1 Procedure OT Evaluation $Initial OT Evaluation Tier I: 1 Procedure OT Treatments $Self Care/Home Management : 8-22 mins G-Codes:    Malka So 07/23/2014, 11:13 AM [phone]

## 2014-07-24 DIAGNOSIS — D509 Iron deficiency anemia, unspecified: Secondary | ICD-10-CM

## 2014-07-24 DIAGNOSIS — N390 Urinary tract infection, site not specified: Secondary | ICD-10-CM

## 2014-07-24 LAB — CBC
HEMATOCRIT: 28 % — AB (ref 36.0–46.0)
HEMOGLOBIN: 9.3 g/dL — AB (ref 12.0–15.0)
MCH: 29.6 pg (ref 26.0–34.0)
MCHC: 33.2 g/dL (ref 30.0–36.0)
MCV: 89.2 fL (ref 78.0–100.0)
Platelets: 102 10*3/uL — ABNORMAL LOW (ref 150–400)
RBC: 3.14 MIL/uL — ABNORMAL LOW (ref 3.87–5.11)
RDW: 14.5 % (ref 11.5–15.5)
WBC: 8 10*3/uL (ref 4.0–10.5)

## 2014-07-24 LAB — CBC WITH DIFFERENTIAL/PLATELET
BASOS ABS: 0 10*3/uL (ref 0.0–0.1)
Basophils Absolute: 0 10*3/uL (ref 0.0–0.1)
Basophils Relative: 0 % (ref 0–1)
Basophils Relative: 0 % (ref 0–1)
EOS ABS: 0.2 10*3/uL (ref 0.0–0.7)
EOS ABS: 0.2 10*3/uL (ref 0.0–0.7)
Eosinophils Relative: 3 % (ref 0–5)
Eosinophils Relative: 3 % (ref 0–5)
HCT: 21.1 % — ABNORMAL LOW (ref 36.0–46.0)
HEMATOCRIT: 20.8 % — AB (ref 36.0–46.0)
Hemoglobin: 6.7 g/dL — CL (ref 12.0–15.0)
Hemoglobin: 6.8 g/dL — CL (ref 12.0–15.0)
LYMPHS ABS: 1.2 10*3/uL (ref 0.7–4.0)
Lymphocytes Relative: 15 % (ref 12–46)
Lymphocytes Relative: 19 % (ref 12–46)
Lymphs Abs: 1.1 10*3/uL (ref 0.7–4.0)
MCH: 29.6 pg (ref 26.0–34.0)
MCH: 28.6 pg (ref 26.0–34.0)
MCHC: 32.2 g/dL (ref 30.0–36.0)
MCHC: 32.2 g/dL (ref 30.0–36.0)
MCV: 88.9 fL (ref 78.0–100.0)
MCV: 91.7 fL (ref 78.0–100.0)
MONO ABS: 0.7 10*3/uL (ref 0.1–1.0)
Monocytes Absolute: 0.5 10*3/uL (ref 0.1–1.0)
Monocytes Relative: 10 % (ref 3–12)
Monocytes Relative: 8 % (ref 3–12)
NEUTROS ABS: 5.4 10*3/uL (ref 1.7–7.7)
Neutro Abs: 4.4 10*3/uL (ref 1.7–7.7)
Neutrophils Relative %: 70 % (ref 43–77)
Neutrophils Relative %: 72 % (ref 43–77)
PLATELETS: 103 10*3/uL — AB (ref 150–400)
Platelets: 91 10*3/uL — ABNORMAL LOW (ref 150–400)
RBC: 2.3 MIL/uL — ABNORMAL LOW (ref 3.87–5.11)
RBC: 2.34 MIL/uL — ABNORMAL LOW (ref 3.87–5.11)
RDW: 14.1 % (ref 11.5–15.5)
RDW: 14.1 % (ref 11.5–15.5)
SMEAR REVIEW: DECREASED
WBC: 6.3 10*3/uL (ref 4.0–10.5)
WBC: 7.4 10*3/uL (ref 4.0–10.5)

## 2014-07-24 LAB — PREPARE RBC (CROSSMATCH)

## 2014-07-24 LAB — URINALYSIS, ROUTINE W REFLEX MICROSCOPIC
Bilirubin Urine: NEGATIVE
Glucose, UA: NEGATIVE mg/dL
Ketones, ur: NEGATIVE mg/dL
Nitrite: POSITIVE — AB
PROTEIN: NEGATIVE mg/dL
Specific Gravity, Urine: 1.015 (ref 1.005–1.030)
UROBILINOGEN UA: 0.2 mg/dL (ref 0.0–1.0)
pH: 5 (ref 5.0–8.0)

## 2014-07-24 LAB — BASIC METABOLIC PANEL
Anion gap: 9 (ref 5–15)
BUN: 39 mg/dL — AB (ref 6–23)
CALCIUM: 7.2 mg/dL — AB (ref 8.4–10.5)
CO2: 21 mEq/L (ref 19–32)
Chloride: 106 mEq/L (ref 96–112)
Creatinine, Ser: 2.18 mg/dL — ABNORMAL HIGH (ref 0.50–1.10)
GFR calc non Af Amer: 19 mL/min — ABNORMAL LOW (ref >90)
GFR, EST AFRICAN AMERICAN: 22 mL/min — AB (ref >90)
GLUCOSE: 107 mg/dL — AB (ref 70–99)
POTASSIUM: 4.6 meq/L (ref 3.7–5.3)
SODIUM: 136 meq/L — AB (ref 137–147)

## 2014-07-24 LAB — URINE MICROSCOPIC-ADD ON

## 2014-07-24 LAB — IRON AND TIBC
Iron: 10 ug/dL — ABNORMAL LOW (ref 42–135)
SATURATION RATIOS: 5 % — AB (ref 20–55)
TIBC: 199 ug/dL — ABNORMAL LOW (ref 250–470)
UIBC: 189 ug/dL (ref 125–400)

## 2014-07-24 LAB — FERRITIN: Ferritin: 64 ng/mL (ref 10–291)

## 2014-07-24 LAB — LACTIC ACID, PLASMA: Lactic Acid, Venous: 1.2 mmol/L (ref 0.5–2.2)

## 2014-07-24 MED ORDER — SODIUM CHLORIDE 0.9 % IV BOLUS (SEPSIS)
500.0000 mL | Freq: Once | INTRAVENOUS | Status: AC
  Administered 2014-07-24: 500 mL via INTRAVENOUS

## 2014-07-24 MED ORDER — POLYETHYLENE GLYCOL 3350 17 G PO PACK
17.0000 g | PACK | Freq: Every day | ORAL | Status: DC
  Administered 2014-07-24 – 2014-07-25 (×2): 17 g via ORAL
  Filled 2014-07-24 (×2): qty 1

## 2014-07-24 MED ORDER — DEXTROSE 5 % IV SOLN
1.0000 g | INTRAVENOUS | Status: DC
  Administered 2014-07-25: 1 g via INTRAVENOUS
  Filled 2014-07-24 (×4): qty 10

## 2014-07-24 MED ORDER — SODIUM CHLORIDE 0.9 % IV SOLN
Freq: Once | INTRAVENOUS | Status: AC
  Administered 2014-07-24: 03:00:00 via INTRAVENOUS

## 2014-07-24 MED ORDER — METOPROLOL SUCCINATE 12.5 MG HALF TABLET
12.5000 mg | ORAL_TABLET | Freq: Every day | ORAL | Status: DC
  Administered 2014-07-25: 12.5 mg via ORAL
  Filled 2014-07-24: qty 1

## 2014-07-24 NOTE — Progress Notes (Signed)
[facility] PROGRESS NOTE  Niger M Vanblarcom [id number] DOB: 01-09-1922 DOA: 07/21/2014 PCP: Purvis Kilts, MD  Assessment/Plan: 1. L humeral fracture, R superior pubic rami fracture 1. Cont with analgesics regimen as needed; will add schedule tylenol in order to help with pain and minimizes use of narcotics 2. PT/OT, has seen patient and recommended SNF 3. Orthopedic surgery recs from ED for conservative measures at this time and to cont with pain meds and wt bearing as tolerated; no surgery 2. CAD 1. Stable 2. Trop neg x3; no abnormalities on telemetry 3. Patient denies CP 3. HTN 1. Soft to mild hypotension 2. Will hold cozaar and lasix; b-blocker change to 12.5mg  daily 3. Will follow BP 4. Asthma 1. Stable. No wheezing 2. Continue dulera BID and PRN albuterol 5. Acute on CKD: stage 3 at baseline 1. Secondary to hypotension and continue use of cozaar/lasix 2. Will continue IVF's 3. Holding nephrotoxic agents 6. Nausea 1. No further N/V episodes. 2. Cont with zofran/compazine as needed 3. Patient nausea most likely associated with pain meds 7. Leukocytosis: due to demargination. Resolved with IV fluids. 8. UTI: will treat with rocephin. Follow urine cx's 9. Low Hgb: had hx of anemia and per anemia panel appears to be iron deficiency anemia. No signs of bleeding. Will stop heparin. S/p 1 unit of PRBC's. Follow Hgb trend. 10. DVT prophylaxis 1. SCD's  Code Status: No intubation, but CPR OK Family Communication: Pt and daughter in room Disposition Plan: will need SNF at discharge   Consultants:  Orthopedics service (consulted by ED physician)  Procedures:  None   Antibiotics:   (indicate start date, and stop date if known)  HPI/Subjective: Patient spike fever overnight and her Hgb dropped to 6.8 early this morning (required one unit of PRBC's  Objective: Filed Vitals:   07/24/14 0715 07/24/14 0745 07/24/14 0826 07/24/14 1440  BP: 106/41 110/43  110/43   Pulse: 78 74  83  Temp: 97.8 F (36.6 C) 98 F (36.7 C)  98.1 F (36.7 C)  TempSrc: Oral Oral  Oral  Resp: 20 16    Height:      Weight: 73.938 kg (163 lb 0.1 oz)     SpO2: 99% 100% 98% 100%    Intake/Output Summary (Last 24 hours) at 07/24/14 1731 Last data filed at 07/24/14 1017  Gross per 24 hour  Intake    210 ml  Output    700 ml  Net   -490 ml   Filed Weights   07/22/14 0243 07/23/14 0619 07/24/14 0715  Weight: 72.4 kg (159 lb 9.8 oz) 74.8 kg (164 lb 14.5 oz) 73.938 kg (163 lb 0.1 oz)    Exam:   General:  Awake, in no distress; afebrile this morning; but spike high fever overnight.  Cardiovascular: regular, s1, s2, positive SEM  Respiratory: normal resp effort, no wheezing  Abdomen: soft, nondistended  Musculoskeletal: perfused, no clubbing   Data Reviewed: Basic Metabolic Panel:  Recent Labs Lab 07/21/14 1913 07/23/14 0630 07/24/14 0155  NA 138 135* 136*  K 4.2 5.0 4.6  CL 99 100 106  CO2 24 24 21   GLUCOSE 122* 116* 107*  BUN 18 38* 39*  CREATININE 1.58* 2.47* 2.18*  CALCIUM 9.6 8.3* 7.2*   Liver Function Tests:  Recent Labs Lab 07/23/14 0630  AST 16  ALT 10  ALKPHOS 71  BILITOT 0.4  PROT 5.6*  ALBUMIN 2.8*   CBC:  Recent Labs Lab 07/21/14 1913 07/23/14 0630 07/23/14 2340 07/24/14  0155 07/24/14 1254  WBC 17.3* 7.9 7.4 6.3 8.0  NEUTROABS 14.6* 6.1 5.4 4.4  --   HGB 12.6 9.4* 6.7* 6.8* 9.3*  HCT 38.2 28.4* 20.8* 21.1* 28.0*  MCV 89.3 91.0 88.9 91.7 89.2  PLT 187 132* 103* 91* 102*   Cardiac Enzymes:  Recent Labs Lab 07/21/14 1913 07/22/14 1916 07/23/14 0115 07/23/14 0900  TROPONINI <0.30 <0.30 <0.30 <0.30   BNP (last 3 results)  Recent Labs  04/15/14 1621  PROBNP 110.5    Studies: Dg Chest Port 1 View  07/24/2014   CLINICAL DATA:  Fever  EXAM: PORTABLE CHEST - 1 VIEW  COMPARISON:  07/21/2014  FINDINGS: Heart size mildly enlarged but stable. Status post CABG. Cardiac pacer in unchanged position. Sternotomy  wires again identified, with evidence of mild dehiscence involving the more superior wires.  Vascular pattern is within normal limits. No evidence of consolidation or effusion.  A 4 cm radio opaque device projects over the cardiac silhouette which was not present previously, of uncertain origin.  IMPRESSION: No acute findings   Electronically Signed   By: Skipper Cliche M.D.   On: 07/24/2014 02:39    Scheduled Meds: . acetaminophen  1,000 mg Oral Q12H  . aspirin EC  81 mg Oral Daily  . cefTRIAXone (ROCEPHIN)  IV  1 g Intravenous Q24H  . levothyroxine  75 mcg Oral QAC breakfast  . [START ON 07/25/2014] metoprolol succinate  12.5 mg Oral Daily  . mometasone-formoterol  2 puff Inhalation BID  . pantoprazole  40 mg Oral Daily  . polyethylene glycol  17 g Oral Daily  . potassium chloride  10 mEq Oral Daily  . sodium chloride  3 mL Intravenous Q12H   Continuous Infusions: . sodium chloride 75 mL/hr at 07/23/14 2139    Active Problems:   Fall   Fracture of humeral head, left, closed   Multiple pelvic fractures  Time spent: 72min  Cleopatra Sardo, [place] [facility] Pager [phone]. If 7PM-7AM, please contact night-coverage at [email or web address], password [facility] 07/24/2014, 5:31 PM  LOS: 3 days

## 2014-07-24 NOTE — Clinical Social Work Psychosocial (Signed)
Clinical Social Work Department BRIEF PSYCHOSOCIAL ASSESSMENT 07/23/2014  Patient:  Amy [name], Amy [name]     Account Number:  [id number]     Admit date:  07/21/2014  Clinical Social Worker:  Glendon Axe, CLINICAL SOCIAL WORKER  Date/Time:  07/23/2014 03:16 PM  Referred by:  Physician  Date Referred:  07/23/2014 Referred for  SNF Placement   Other Referral:   PT recommended SNF placement.   Interview type:  Other - See comment Other interview type:   CSW spoke with pt and pt's daughter at bedside.    PSYCHOSOCIAL DATA Living Status:  ALONE Admitted from facility:   Level of care:   Primary support name:  Francie Massing Primary support relationship to patient:  CHILD, ADULT Degree of support available:   Strong    CURRENT CONCERNS Current Concerns  Post-Acute Placement   Other Concerns:    SOCIAL WORK ASSESSMENT / PLAN Clinical Social Worker spoke with pt and pt's daughter in reference to SNF placement. CSW provided pt's daughter with SNF list and explained the SNF process. Pt's daughter explained pt was at [facility] twice in the past and pt reported that she was very pleased with the service. Pt also stated that her husband was at [facility] in the past as well. CSW to follow up with pt and pt's daughter to provide continued support and facilitate pt's discharge needs.   Assessment/plan status:  Psychosocial Support/Ongoing Assessment of Needs Other assessment/ plan:   Information/referral to community resources:   Provided pt and pt's daughter with SNF list.    PATIENT'S/FAMILY'S RESPONSE TO PLAN OF CARE: Pt laying in bed with daughter at bedside. Pt alert/oriented and friendly. Pt agreed with plan for SNF placement and would like to return to [facility] if possible. Pt stated that she was pleased with [facility] service delivery. Pt's daughter also agreed and was relieved that SNF was recommended versus home health. Pt's daughter stated she would be visiting pt on a daily basis and  looks forward to pt recovering and returning back home. CSW remains available as needed/support.

## 2014-07-24 NOTE — Progress Notes (Signed)
Patient has had 2 500cc bolus for low blood pressure. Blood pressure is staying the same, MD aware and ordered third bolus. Patient labs for hgb jsut came back at 6.7. MD paged and awaiting response. Will continue to monitor patient

## 2014-07-25 DIAGNOSIS — Z5189 Encounter for other specified aftercare: Secondary | ICD-10-CM | POA: Diagnosis not present

## 2014-07-25 DIAGNOSIS — Z9181 History of falling: Secondary | ICD-10-CM | POA: Diagnosis not present

## 2014-07-25 DIAGNOSIS — Z96649 Presence of unspecified artificial hip joint: Secondary | ICD-10-CM | POA: Diagnosis not present

## 2014-07-25 DIAGNOSIS — N390 Urinary tract infection, site not specified: Secondary | ICD-10-CM | POA: Diagnosis not present

## 2014-07-25 DIAGNOSIS — S42309D Unspecified fracture of shaft of humerus, unspecified arm, subsequent encounter for fracture with routine healing: Secondary | ICD-10-CM | POA: Diagnosis not present

## 2014-07-25 DIAGNOSIS — S3289XA Fracture of other parts of pelvis, initial encounter for closed fracture: Secondary | ICD-10-CM | POA: Diagnosis not present

## 2014-07-25 DIAGNOSIS — R262 Difficulty in walking, not elsewhere classified: Secondary | ICD-10-CM | POA: Diagnosis not present

## 2014-07-25 DIAGNOSIS — K219 Gastro-esophageal reflux disease without esophagitis: Secondary | ICD-10-CM | POA: Diagnosis not present

## 2014-07-25 DIAGNOSIS — N183 Chronic kidney disease, stage 3 unspecified: Secondary | ICD-10-CM | POA: Diagnosis not present

## 2014-07-25 DIAGNOSIS — N179 Acute kidney failure, unspecified: Secondary | ICD-10-CM | POA: Diagnosis not present

## 2014-07-25 DIAGNOSIS — G40909 Epilepsy, unspecified, not intractable, without status epilepticus: Secondary | ICD-10-CM | POA: Diagnosis not present

## 2014-07-25 DIAGNOSIS — S42209A Unspecified fracture of upper end of unspecified humerus, initial encounter for closed fracture: Secondary | ICD-10-CM | POA: Diagnosis not present

## 2014-07-25 DIAGNOSIS — R0602 Shortness of breath: Secondary | ICD-10-CM | POA: Diagnosis not present

## 2014-07-25 DIAGNOSIS — E785 Hyperlipidemia, unspecified: Secondary | ICD-10-CM

## 2014-07-25 DIAGNOSIS — I10 Essential (primary) hypertension: Secondary | ICD-10-CM | POA: Diagnosis not present

## 2014-07-25 DIAGNOSIS — K5900 Constipation, unspecified: Secondary | ICD-10-CM | POA: Diagnosis not present

## 2014-07-25 DIAGNOSIS — IMO0001 Reserved for inherently not codable concepts without codable children: Secondary | ICD-10-CM | POA: Diagnosis not present

## 2014-07-25 DIAGNOSIS — S3282XA Multiple fractures of pelvis without disruption of pelvic ring, initial encounter for closed fracture: Secondary | ICD-10-CM | POA: Diagnosis not present

## 2014-07-25 DIAGNOSIS — D509 Iron deficiency anemia, unspecified: Secondary | ICD-10-CM | POA: Diagnosis not present

## 2014-07-25 DIAGNOSIS — M6281 Muscle weakness (generalized): Secondary | ICD-10-CM | POA: Diagnosis not present

## 2014-07-25 DIAGNOSIS — R1310 Dysphagia, unspecified: Secondary | ICD-10-CM | POA: Diagnosis not present

## 2014-07-25 DIAGNOSIS — I2510 Atherosclerotic heart disease of native coronary artery without angina pectoris: Secondary | ICD-10-CM | POA: Diagnosis not present

## 2014-07-25 LAB — BASIC METABOLIC PANEL
Anion gap: 10 (ref 5–15)
BUN: 30 mg/dL — AB (ref 6–23)
CO2: 24 mEq/L (ref 19–32)
CREATININE: 1.45 mg/dL — AB (ref 0.50–1.10)
Calcium: 8.7 mg/dL (ref 8.4–10.5)
Chloride: 107 mEq/L (ref 96–112)
GFR, EST AFRICAN AMERICAN: 36 mL/min — AB (ref >90)
GFR, EST NON AFRICAN AMERICAN: 31 mL/min — AB (ref >90)
GLUCOSE: 110 mg/dL — AB (ref 70–99)
Potassium: 5.2 mEq/L (ref 3.7–5.3)
Sodium: 141 mEq/L (ref 137–147)

## 2014-07-25 LAB — TYPE AND SCREEN
ABO/RH(D): O NEG
Antibody Screen: NEGATIVE
UNIT DIVISION: 0

## 2014-07-25 LAB — CBC
HCT: 31.3 % — ABNORMAL LOW (ref 36.0–46.0)
Hemoglobin: 10.2 g/dL — ABNORMAL LOW (ref 12.0–15.0)
MCH: 29.1 pg (ref 26.0–34.0)
MCHC: 32.6 g/dL (ref 30.0–36.0)
MCV: 89.2 fL (ref 78.0–100.0)
PLATELETS: 105 10*3/uL — AB (ref 150–400)
RBC: 3.51 MIL/uL — AB (ref 3.87–5.11)
RDW: 14.7 % (ref 11.5–15.5)
WBC: 7 10*3/uL (ref 4.0–10.5)

## 2014-07-25 MED ORDER — POLYETHYLENE GLYCOL 3350 17 G PO PACK: 17.0000 g | PACK | Freq: Every day | ORAL | Status: DC

## 2014-07-25 MED ORDER — ONDANSETRON HCL 4 MG/2ML IJ SOLN: 4.0000 mg | Freq: Four times a day (QID) | INTRAMUSCULAR | Status: DC | PRN

## 2014-07-25 MED ORDER — ONDANSETRON HCL 4 MG PO TABS
4.0000 mg | ORAL_TABLET | Freq: Four times a day (QID) | ORAL | Status: DC | PRN
  Administered 2014-07-25 (×2): 4 mg via ORAL
  Filled 2014-07-25 (×2): qty 1

## 2014-07-25 MED ORDER — TRAMADOL HCL 50 MG PO TABS: 50.0000 mg | ORAL_TABLET | Freq: Four times a day (QID) | ORAL | Status: DC | PRN

## 2014-07-25 MED ORDER — ACETAMINOPHEN 500 MG PO TABS: 1000.0000 mg | ORAL_TABLET | Freq: Three times a day (TID) | ORAL | Status: DC

## 2014-07-25 MED ORDER — FERROUS FUMARATE 150 MG PO TABS: 1.0000 | ORAL_TABLET | Freq: Two times a day (BID) | ORAL | Status: DC

## 2014-07-25 MED ORDER — METOPROLOL SUCCINATE ER 25 MG PO TB24: 12.5000 mg | ORAL_TABLET | Freq: Every day | ORAL | Status: DC

## 2014-07-25 MED ORDER — MORPHINE SULFATE 15 MG PO TABS: 15.0000 mg | ORAL_TABLET | ORAL | Status: DC | PRN

## 2014-07-25 MED ORDER — CEFUROXIME AXETIL 500 MG PO TABS: 500.0000 mg | ORAL_TABLET | Freq: Two times a day (BID) | ORAL | Status: AC

## 2014-07-25 MED ORDER — LORAZEPAM 1 MG PO TABS: 1.0000 mg | ORAL_TABLET | Freq: Every day | ORAL | Status: DC | PRN

## 2014-07-25 NOTE — Care Management Note (Addendum)
    Page 1 of 1   07/31/2014     10:57:25 AM CARE MANAGEMENT NOTE 07/31/2014  Patient:  Amy [name], Amy [name]   Account Number:  [id number]  Date Initiated:  07/25/2014  Documentation initiated by:  Mariann Laster  Subjective/Objective Assessment:   Falll, Multiple Fx's     Action/Plan:   CM to follow for disposition needs   Anticipated DC Date:  07/25/2014   Anticipated DC Plan:  SKILLED NURSING FACILITY  In-house referral  Clinical Social Worker      DC Planning Services  CM consult      [facility] Choice  NA   Choice offered to / List presented to:             Status of service:  Completed, signed off Medicare Important Message given?  YES (If response is "NO", the following Medicare IM given date fields will be blank) Date Medicare IM given:  07/23/2014 Medicare IM given by:   Date Additional Medicare IM given:  07/25/2014 Additional Medicare IM given by:  [place]  Discharge Disposition:  [place]  Per UR Regulation:  Reviewed for med. necessity/level of care/duration of stay  If discussed at [place] of Stay Meetings, dates discussed:    Comments:  Crystal Hutchinson RN, BSN, MSHL, CCM  Nurse - Case Manager,  (Unit [phone]  07/31/2014 Post Discharge CM NOTE: CM confirmed with SW/Donna Crowderthat patient d/c to SNF/ [facility].   Crystal Hutchinson RN, BSN, MSHL, CCM  Nurse - Case Manager,  (Unit [phone]  07/25/2014 Disposition:  SNF (See SW note for further details)

## 2014-07-25 NOTE — Progress Notes (Signed)
Left with transporters.  Son @ stretcher side.

## 2014-07-25 NOTE — Plan of Care (Signed)
Problem: Discharge Progression Outcomes Goal: Ambulates safely using assistive device Outcome: Not Applicable Date Met:  07/25/14 Up with PT only and NWB to left upper extremity     

## 2014-07-25 NOTE — Progress Notes (Signed)
Report called to Celso Amy, @ [facility].  Transporters her and son at bedside.  No clothes or personal belongings to send.  Family bringing clothes to facility.    No voiced complaints

## 2014-07-25 NOTE — Plan of Care (Signed)
Problem: Discharge Progression Outcomes Goal: Follows weight - bearing limitations Outcome: Completed/Met Date Met:  07/25/14 Up with PT only

## 2014-07-25 NOTE — Progress Notes (Signed)
Received report on Ms Divelbiss for 3p-7p shift.  She is awaiting ambulance transport to [facility] facility.  Son at bedside.  Patient gave permission for me to give him copy of discharge instructions.   Stated understanding that copy of d/c instructions is included in package going to facility with transporters.  Pt stated she gets motion sickness with car travel, so zofran 4 mg po given at present per her request.  Pain at level 3 (baseline) after 2:30p Dilaudid.  Pt alert and oriented.  Sling to left arm intact.  Will call report to [phone].

## 2014-07-25 NOTE — Progress Notes (Signed)
Physical Therapy Treatment Patient Details Name: Niger M Vandermeulen MRN: [id number] DOB: 07-01-1922 Today's Date: 07/25/2014    History of Present Illness Pt admitted after falling at home sustained a L humeral fx, R superior ramus fracture and L inferior ramus fracture.    PT Comments    Pt progressing slowly towards physical therapy goals. Pt had IV pain meds before mobility, and was able to tolerate sitting EOB with much less pain than last session. Unable to achieve full standing at this time, even with total assist +2. Will continue to follow and progress as able per POC.   Follow Up Recommendations  SNF;Supervision/Assistance - 24 hour     Equipment Recommendations  3in1 (PT);Wheelchair (measurements PT);Wheelchair cushion (measurements PT) (Cane or hemi walker instead of w/c depending on progress)    Recommendations for Other Services       Precautions / Restrictions Precautions Precautions: Fall Required Braces or Orthoses: Sling Restrictions Weight Bearing Restrictions: Yes LUE Weight Bearing: Non weight bearing RLE Weight Bearing: Weight bearing as tolerated LLE Weight Bearing: Weight bearing as tolerated    Mobility  Bed Mobility Overal bed mobility: +2 for physical assistance;Needs Assistance Bed Mobility: Supine to Sit;Sit to Supine     Supine to sit: +2 for physical assistance;Mod assist Sit to supine: +2 for physical assistance;Max assist   General bed mobility comments: Bed pad used to scoot hips towards EOB. Pt able to minimally move LE's without assistance. +2 for trunk elevation into full sitting position.   Transfers Overall transfer level: Needs assistance Equipment used: 2 person hand held assist Transfers: Sit to/from Stand Sit to Stand: +2 physical assistance;Total assist         General transfer comment: Unable to complete transfer to full standing. Pt with minimal ability to participate in transfer due to anxiety/pain, and became incontinent  with sit>stand attempt.   Ambulation/Gait             General Gait Details: Deffered - unable at this time.    Stairs            Wheelchair Mobility    Modified Rankin (Stroke Patients Only)       Balance Overall balance assessment: History of Falls;Needs assistance Sitting-balance support: Feet supported;Single extremity supported Sitting balance-Leahy Scale: Poor     Standing balance support: Single extremity supported Standing balance-Leahy Scale: Zero                      Cognition Arousal/Alertness: Awake/alert Behavior During Therapy: Anxious Overall Cognitive Status: Within Functional Limits for tasks assessed                      Exercises General Exercises - Lower Extremity Ankle Circles/Pumps: 20 reps Quad Sets: 10 reps Heel Slides: 5 reps    General Comments        Pertinent Vitals/Pain Pain Assessment: 0-10 Pain Score: 6  Pain Location: Pelvis, shoulder Pain Descriptors / Indicators: Aching Pain Intervention(s): Premedicated before session;Monitored during session;Repositioned    Home Living                      Prior Function            PT Goals (current goals can now be found in the care plan section) Acute Rehab PT Goals Patient Stated Goal: SNF for rehab PT Goal Formulation: With patient/family Time For Goal Achievement: 08/06/14 Potential to Achieve Goals: Good Progress towards PT goals:  Progressing toward goals    Frequency  Min 2X/week    PT Plan Current plan remains appropriate    Co-evaluation             End of Session Equipment Utilized During Treatment: Gait belt;Oxygen Activity Tolerance: Patient limited by pain Patient left: in bed;with call bell/phone within reach;with bed alarm set;with family/visitor present     Time: [id number] PT Time Calculation (min): 40 min  Charges:  $Therapeutic Exercise: 8-22 mins $Therapeutic Activity: 23-37 mins                    G Codes:       Jolyn Lent 2014/08/16, 5:06 PM  Jolyn Lent, PT, DPT Acute Rehabilitation Services Pager: [phone]

## 2014-07-25 NOTE — Clinical Social Work Placement (Addendum)
     Clinical Social Work Department CLINICAL SOCIAL WORK PLACEMENT NOTE 07/25/2014  Patient:  Amy [name], Amy [name]  Account Number:  [id number] [place] date:  07/21/2014  Clinical Social Worker:  Butch Penny Ameila Weldon, LCSW  Date/time:  07/25/2014 01:01 PM  Clinical Social Work is seeking post-discharge placement for this patient at the following level of care:   SKILLED NURSING   (*CSW will update this form in Epic as items are completed)   07/25/2014  Patient/family provided with [facility] Department of Clinical Social Works list of facilities offering this level of care within the geographic area requested by the patient (or if unable, by the patients family).  07/25/2014  Patient/family informed of their freedom to choose among providers that offer the needed level of care, that participate in Medicare, Medicaid or managed care program needed by the patient, have an available bed and are willing to accept the patient.  07/25/2014  Patient/family informed of [facility] ownership interest in [facility], as well as of the fact that they are under no obligation to receive care at this facility.  PASARR submitted to EDS on  PASARR number received on   FL2 transmitted to all facilities in geographic area requested by pt/family on  07/25/2014 FL2 transmitted to all facilities within larger geographic area on   Patient informed that his/her managed care company has contracts with or will negotiate with  certain facilities, including the following:   NA  Has existing PASARR     Patient/family informed of bed offers received:  07/25/2014 Patient chooses bed at [place], [facility] Physician recommends and patient chooses bed at    Patient to be transferred to [place], [facility] on  07/24/2014 Patient to be transferred to facility by Ambulance  [facility]) Patient and family notified of transfer on 07/25/2014 Name of family member notified:  Francie Massing   Daughter  The following physician request were entered in Epic: Physician Request  Please sign FL2.  Please prepare priority discharge summary and prescriptions.    Additional Comments: 07/25/14  OK per MD for DC today to SNF.  Patient and daughter Juliann Pulse are agreeable to d/c plan. They had hoped for a bed at the [facility] and daughter wants to keep her mother's name on the list at the [facility]. They were not displeased with bed offer from [facility].  Nursing notified to call report.  CSW signing off.No further intervention indicated.   Lorie Phenix. [place], [place]

## 2014-07-25 NOTE — Progress Notes (Signed)
Occupational Therapy Treatment Patient Details Name: Niger M Brew MRN: [id number] DOB: 1922/09/26 Today's Date: 07/25/2014    History of present illness Pt admitted after falling at home sustained a L humeral fx, R superior ramus fracture and L inferior ramus fracture.   OT comments  Focus of session on bedlevel ROM of L elbow, forearm, wrist and hand and grooming.  Pain continues to limit pt. Pt is hopeful for d/c to SNF soon.  Follow Up Recommendations  SNF;Supervision/Assistance - 24 hour    Equipment Recommendations       Recommendations for Other Services      Precautions / Restrictions Precautions Precautions: Fall Required Braces or Orthoses: Sling Restrictions LUE Weight Bearing: Non weight bearing RLE Weight Bearing: Weight bearing as tolerated LLE Weight Bearing: Weight bearing as tolerated       Mobility Bed Mobility               General bed mobility comments: Rolled toward R with max assist for adjustment of sling and positioning of L UE.  Transfers                      Balance                                   ADL Overall ADL's : Needs assistance/impaired     Grooming: Oral care;Wash/dry face;Set up;Bed level                                        Vision                     Perception     Praxis      Cognition   Behavior During Therapy: Anxious Overall Cognitive Status: Within Functional Limits for tasks assessed                       Extremity/Trunk Assessment               Exercises     Shoulder Instructions       General Comments      Pertinent Vitals/ Pain       Pain Assessment: 0-10 Pain Score: 4  Pain Location: L shoulder Pain Descriptors / Indicators: Aching Pain Intervention(s): Limited activity within patient's tolerance;Monitored during session;Repositioned;Patient requesting pain meds-RN notified  Home Living                                          Prior Functioning/Environment              Frequency Min 2X/week     Progress Toward Goals  OT Goals(current goals can now be found in the care plan section)  Progress towards OT goals: Progressing toward goals     Plan Discharge plan remains appropriate    Co-evaluation                 End of Session Equipment Utilized During Treatment: Oxygen   Activity Tolerance Patient limited by pain   Patient Left in bed;with call bell/phone within reach;with family/visitor present   Nurse Communication Patient requests pain meds        Time:  [id number] OT Time Calculation (min): 30 min  Charges: OT General Charges $OT Visit: 1 Procedure OT Treatments $Self Care/Home Management : 8-22 mins $Therapeutic Exercise: 8-22 mins  Malka So 07/25/2014, 2:32 PM [phone]

## 2014-07-25 NOTE — Discharge Summary (Signed)
Physician Discharge Summary  Niger M Morissette [id number] DOB: 1921/11/26 DOA: 07/21/2014  PCP: Purvis Kilts, MD  Admit date: 07/21/2014 Discharge date: 07/25/2014  Time spent: >30 minutes  Recommendations for Outpatient Follow-up:  Reassess BMET and CBC to follow electrolytes, renal function and Hgb trend Reassess BP and adjust medications as needed Will need adjustment of pain meds for Pain control, as needed  Discharge Diagnoses:  Active Problems:   Fall   Fracture of humeral head, left, closed   Multiple pelvic fractures acute on chronic renal failure Gram neg Rods UTI HTN Chronic diastolic heart failure Asthma Iron deficiency anemia  Discharge Condition: stable. Still with pain on her fracture bones with movements but somewhat improved. Will discharge to SNF for rehabilitation.  Diet recommendation: low sodium/heart healthy diet  Filed Weights   07/23/14 0619 07/24/14 0715 07/25/14 0454  Weight: 74.8 kg (164 lb 14.5 oz) 73.938 kg (163 lb 0.1 oz) 74.889 kg (165 lb 1.6 oz)    History of present illness:  80 y.o. year old female with significant past medical history of CAD s/p CABG, PAF, HTN, arthritis, GERD, asthma, s/p bilateral hip arthroplasty presenting with fall and multiple fractures including L humeral head, L sacrum, L inferior pubic rami, R superior pubic rami fracture. Pt is followed at [facility].  Pt usually ambulates with a rolling walker. States that she has had progressive weakness over the last few months prior to admission and her ability to walk is limited. Denies any CP, SOB, diaphoresis, hemiparesis or any other complaints prior to fall or associated with the fall.    Hospital Course:  L humeral fracture, R superior pubic rami fracture  1. Cont with analgesics regimen as needed; will add schedule tylenol in order to help with pain and minimizes use of narcotics. 2. PT/OT, has seen patient and recommended SNF. Will arranged discharge to SNF for  rehab 3. Orthopedic surgery recs are for conservative management, pain control, weight bearing as tolerated during rehab and follow up in 2 weeks in the outpatient setting. No surgery CAD  1. Stable 2. Trop neg x3; no abnormalities on telemetry 3. Patient denies CP 4. Continue home regimen  HTN  1. Soft to mild hypotension on admisison 2. Now stable/rising 3. Will resume home antihypertensives and follow BP fluctuation for further adjustments  Asthma  1. Stable. No wheezing 2. Continue dulera BID and PRN albuterol as per home regimen  Acute on CKD: stage 3 at baseline  1. Secondary to hypotension, UTI and continue use of cozaar/lasix 2. Resolved and back to baseline 3. Cozaar and lasix resume at discharge 4. BMET in 1 week  Nausea & Vomiting  1. No further N/V episodes. 2. Cont with zofran as needed 3. Patient nausea most likely associated with pain meds  Leukocytosis: due to demargination and UTI. -Resolved with IV fluids and antibiotics.  Gram Neg Rods UTI: will be treated with 5 days of antibiotics (ceftin 500mg  BID) -no fever, normal WBC's and no dysuria at discharge  Low Hgb: had hx of anemia and per anemia panel appears to be iron deficiency anemia. No signs of bleeding.  -received 1 unit of PRBC during hospitalization -discharge on ferrous fumarate -Hgb at discharge [id number]  Chronic diastolic heart failure: stable and compensated. -advise to follow low sodium -check daily weights -continue b-blocker and lasix  Procedures:  See below for x-ray reports   Consultations: Orthopedics service (consulted by ED physician): Dr. Ronnie Derby to see 2 weeks after discharge. No plans  for surgery.  Discharge Exam: Filed Vitals:   07/25/14 1010  BP: 154/53  Pulse: 93  Temp: 98.4 F (36.9 C)  Resp: 20   General: Awake, in no distress; afebrile and complaining of pain in her hips/left shoulder with movements Cardiovascular: regular, s1, s2, positive SEM; no  rubs Respiratory: normal resp effort, no wheezing  Abdomen: soft, nondistended; no tender; positive BS Musculoskeletal: perfused, no clubbing, trace edema    Discharge Instructions You were cared for by a hospitalist during your hospital stay. If you have any questions about your discharge medications or the care you received while you were in the hospital after you are discharged, you can call the unit and asked to speak with the hospitalist on call if the hospitalist that took care of you is not available. Once you are discharged, your primary care physician will handle any further medical issues. Please note that NO REFILLS for any discharge medications will be authorized once you are discharged, as it is imperative that you return to your primary care physician (or establish a relationship with a primary care physician if you do not have one) for your aftercare needs so that they can reassess your need for medications and monitor your lab values.  Discharge Instructions   Diet - low sodium heart healthy    Complete by:  As directed      Discharge instructions    Complete by:  As directed   Take medications as prescribed Maintain good level of hydration Physical rehab with weight bearing as tolerated (using platform RW) as per facility protocol Low sodium/heart healthy diet and daily weights (to track changes on her volume) BMET in 1 week          Current Discharge Medication List    START taking these medications   Details  acetaminophen (TYLENOL) 500 MG tablet Take 2 tablets (1,000 mg total) by mouth every 8 (eight) hours. Qty: 30 tablet, Refills: 0    cefUROXime (CEFTIN) 500 MG tablet Take 1 tablet (500 mg total) by mouth 2 (two) times daily with a meal. Antibiotics for 5 days    Ferrous Fumarate 150 MG TABS Take 1 tablet (150 mg total) by mouth 2 (two) times daily.    morphine (MSIR) 15 MG tablet Take 1 tablet (15 mg total) by mouth every 4 (four) hours as needed for severe  pain. Qty: 30 tablet, Refills: 0    polyethylene glycol (MIRALAX / GLYCOLAX) packet Take 17 g by mouth daily. Hold for diarrhea Qty: 14 each, Refills: 0    traMADol (ULTRAM) 50 MG tablet Take 1 tablet (50 mg total) by mouth every 6 (six) hours as needed for moderate pain. Qty: 30 tablet, Refills: 0      CONTINUE these medications which have CHANGED   Details  LORazepam (ATIVAN) 1 MG tablet Take 1 tablet (1 mg total) by mouth daily as needed for anxiety. Qty: 30 tablet, Refills: 0    metoprolol succinate (TOPROL-XL) 25 MG 24 hr tablet Take 0.5 tablets (12.5 mg total) by mouth daily.      CONTINUE these medications which have NOT CHANGED   Details  aspirin EC 81 MG tablet Take 81 mg by mouth daily.    Fluticasone-Salmeterol (ADVAIR) 100-50 MCG/DOSE AEPB Inhale 1 puff into the lungs daily as needed (shortness of breath).     furosemide (LASIX) 20 MG tablet Take 20 mg by mouth daily.    levothyroxine (SYNTHROID, LEVOTHROID) 75 MCG tablet Take 75 mcg by mouth  daily before breakfast.    losartan (COZAAR) 50 MG tablet Take 1 tablet (50 mg total) by mouth daily. Qty: 30 tablet, Refills: 3    ondansetron (ZOFRAN) 4 MG tablet Take 4 mg by mouth every 6 (six) hours as needed for nausea or vomiting.    pantoprazole (PROTONIX) 40 MG tablet Take 40 mg by mouth daily.    albuterol (PROVENTIL HFA;VENTOLIN HFA) 108 (90 BASE) MCG/ACT inhaler Inhale 2 puffs into the lungs every 4 (four) hours as needed for wheezing or shortness of breath. Qty: 1 Inhaler, Refills: 1    nitroGLYCERIN (NITROSTAT) 0.4 MG SL tablet Place 1 tablet (0.4 mg total) under the tongue every 5 (five) minutes x 3 doses as needed for chest pain. Qty: 25 tablet, Refills: 12      STOP taking these medications     potassium chloride (K-DUR) 10 MEQ tablet        Allergies  Allergen Reactions  . Colestipol     Muscle aches  . Hydrocodone-Acetaminophen Itching    Tolerates tylenol  . Niacin And Related   . Statins      Muscle aches on all she has tried.  She recalls Lipitor, Crestor, and Livalo but thinks there were others  . Zetia [Ezetimibe]     Muscle aches   Follow-up Information   Follow up with [facility]. (Equities trader, Physical Therapy, Occupational therapy, Jolly, Social Worker services to start within 24-48 hours of discharge)    Contact information:   [place] [address] [phone]       Follow up with Purvis Kilts, MD In 10 days. (after discharge from facility )    Specialty:  Family Medicine   Contact information:   [address] [place] [id number] [phone]       Follow up with Rudean Haskell, MD In 2 weeks.   Specialty:  Orthopedic Surgery   Contact information:   [place] [address] [phone]       The results of significant diagnostics from this hospitalization (including imaging, microbiology, ancillary and laboratory) are listed below for reference.    Significant Diagnostic Studies: Dg Lumbar Spine Complete  07/21/2014   CLINICAL DATA:  Status post fall in the bathroom with low back and left hip pain; known left humeral head fracture.  EXAM: LUMBAR SPINE - COMPLETE 4+ VIEW  COMPARISON:  None.  FINDINGS: The lumbar vertebral bodies are diffusely osteopenic but are preserved in height. There is no spondylolisthesis. The disc space heights are well maintained. There is facet joint hypertrophy from L3-4 through L5-S1. The pedicles and transverse processes are intact where visualized. The observed portions of the sacrum are normal.  IMPRESSION: There is no acute lumbar spine fracture or dislocation. There are degenerative facet joint changes.   Electronically Signed   By: David  Martinique   On: 07/21/2014 18:11   Dg Pelvis 1-2 Views  07/21/2014   CLINICAL DATA:  Status post fall in the bathroom now with left-sided hip pain ; known fractures of the left shoulder.  EXAM: PELVIS - 1-2  VIEW  COMPARISON:  Hip series of December 02, 2012  FINDINGS: There is new lucency and irregularity within the midportion of the inferior pubic ramus on the left. This is worrisome for an acute fracture. The superior pubic ramus is intact. There are prosthetic hip joints bilaterally which appear intact. The iliac bones are intact. The observed portions of the sacrum  are normal.  IMPRESSION: There is likely an impacted fracture of the inferior pubic ramus on the left.   Electronically Signed   By: David  Martinique   On: 07/21/2014 18:09   Dg Femur Left  07/21/2014   CLINICAL DATA:  Status post fall in the bathroom with known acute fractures of the shoulder. Left hip and femur pain  EXAM: LEFT FEMUR - 2 VIEW  COMPARISON:  AP pelvis of today's date  FINDINGS: There is a prosthetic left hip joint. The interface with the native bone appears normal. The positioning of the prosthetic femoral head within the native acetabulum is normal. The femoral shaft is normal in appearance. The observed portions of the knee exhibit no acute abnormalities. There is new irregularity of the inferior pubic ramus.  IMPRESSION: There is no evidence of an acute fracture of the left femur. There is likely a nondisplaced fracture of the inferior pubic ramus on the left.   Electronically Signed   By: David  Martinique   On: 07/21/2014 18:08   Ct Head Wo Contrast  07/21/2014   CLINICAL DATA:  [place] today, left shoulder pain  EXAM: CT HEAD WITHOUT CONTRAST  CT CERVICAL SPINE WITHOUT CONTRAST  TECHNIQUE: Multidetector CT imaging of the head and cervical spine was performed following the standard protocol without intravenous contrast. Multiplanar CT image reconstructions of the cervical spine were also generated.  COMPARISON:  08/10/2012, 08/07/2012  FINDINGS: CT HEAD FINDINGS  Study limited by motion artifact. Diffuse atrophy. Mild low attenuation in the deep white matter. No evidence of vascular territory infarct, hemorrhage, extra-axial fluid, or  mass. No skull fracture.  CT CERVICAL SPINE FINDINGS  Normal alignment with no fracture. Multilevel degenerative disc disease. No paraspinous hematoma. Mild pleural and parenchymal scarring at the lung apices. Extensive bilateral carotid calcification.  IMPRESSION: No acute abnormalities involving the cervical spine.  Evaluation of the head limited by motion artifact, but grossly negative.   Electronically Signed   By: Skipper Cliche M.D.   On: 07/21/2014 20:19   Ct Cervical Spine Wo Contrast  07/21/2014   CLINICAL DATA:  [place] today, left shoulder pain  EXAM: CT HEAD WITHOUT CONTRAST  CT CERVICAL SPINE WITHOUT CONTRAST  TECHNIQUE: Multidetector CT imaging of the head and cervical spine was performed following the standard protocol without intravenous contrast. Multiplanar CT image reconstructions of the cervical spine were also generated.  COMPARISON:  08/10/2012, 08/07/2012  FINDINGS: CT HEAD FINDINGS  Study limited by motion artifact. Diffuse atrophy. Mild low attenuation in the deep white matter. No evidence of vascular territory infarct, hemorrhage, extra-axial fluid, or mass. No skull fracture.  CT CERVICAL SPINE FINDINGS  Normal alignment with no fracture. Multilevel degenerative disc disease. No paraspinous hematoma. Mild pleural and parenchymal scarring at the lung apices. Extensive bilateral carotid calcification.  IMPRESSION: No acute abnormalities involving the cervical spine.  Evaluation of the head limited by motion artifact, but grossly negative.   Electronically Signed   By: Skipper Cliche M.D.   On: 07/21/2014 20:19   Ct Pelvis Wo Contrast  07/21/2014   CLINICAL DATA:  Fall, left hip pain, status post bilateral hip replacement  EXAM: CT PELVIS WITHOUT CONTRAST  TECHNIQUE: Multidetector CT imaging of the pelvis was performed following the standard protocol without intravenous contrast.  COMPARISON:  CT abdomen pelvis dated 12/11/2012.  FINDINGS: Status post bilateral total hip arthroplasty.   No evidence of loosening or hardware complication.  Nondisplaced fractures involving the left sacrum (series 5/ image 35), left inferior  pubic ramus (series 9/ image 60), and right superior pubic ramus/ parasymphyseal region (series 5/ image 87).  Soft tissue pelvis is markedly obscured by streak artifact from the patient's bilateral hip prostheses.  Visualized portions of the uterus and bladder are unremarkable.  Vascular calcifications.  No pelvic ascites.  IMPRESSION: Status post bilateral total hip arthroplasty, without evidence of complication.  Nondisplaced fractures involving the left sacrum, left inferior pubic ramus, and right superior pubic ramus/parasymphyseal region.   Electronically Signed   By: Julian Hy M.D.   On: 07/21/2014 20:17   Dg Chest Port 1 View  07/24/2014   CLINICAL DATA:  Fever  EXAM: PORTABLE CHEST - 1 VIEW  COMPARISON:  07/21/2014  FINDINGS: Heart size mildly enlarged but stable. Status post CABG. Cardiac pacer in unchanged position. Sternotomy wires again identified, with evidence of mild dehiscence involving the more superior wires.  Vascular pattern is within normal limits. No evidence of consolidation or effusion.  A 4 cm radio opaque device projects over the cardiac silhouette which was not present previously, of uncertain origin.  IMPRESSION: No acute findings   Electronically Signed   By: Skipper Cliche M.D.   On: 07/24/2014 02:39   Dg Chest Portable 1 View  07/21/2014   CLINICAL DATA:  Fall  EXAM: PORTABLE CHEST - 1 VIEW  COMPARISON:  04/15/2014  FINDINGS: Chronic interstitial markings/emphysematous changes. No focal consolidation. No pleural effusion or pneumothorax.  The heart is normal in size. Postsurgical changes related to prior CABG. Left subclavian pacemaker.  IMPRESSION: No evidence of acute cardiopulmonary disease.   Electronically Signed   By: Julian Hy M.D.   On: 07/21/2014 21:47   Dg Shoulder Left  07/22/2014   CLINICAL DATA:  Pain, fall   EXAM: LEFT SHOULDER - 2+ VIEW  COMPARISON:  07/21/2014  FINDINGS: Osseous demineralization.  AC joint alignment normal.  Comminuted mildly displaced proximal LEFT humeral fracture.  Underlying glenohumeral degenerative changes.  No dislocation evident on AP exam ; no evidence of dislocation on the preceding scapular Y-view of 07/21/2014.  Visualized LEFT ribs intact.  Atherosclerotic calcification aorta.  Post pacemaker and CABG.  IMPRESSION: Comminuted displaced proximal LEFT humeral fracture.  Osseous demineralization.   Electronically Signed   By: Lavonia Dana M.D.   On: 07/22/2014 13:51   Dg Shoulder Left  07/21/2014   CLINICAL DATA:  Status post fall in the bathroom now complaining of severe left shoulder pain. ; history of previous MI, asthma, and pacemaker placement. Also chronic renal insufficiency.  EXAM: LEFT SHOULDER - 2+ VIEW  COMPARISON:  Left shoulder series of October 9th 2013  FINDINGS: The patient has sustained an acute angulated comminuted fracture of the left humeral head and neck. There is mild avulsion of the greater tuberosity. The observed portions of the scapula and left clavicle are intact. The observed left ribs are normal.  IMPRESSION: The patient has sustained an acute angulated fracture of the left humeral head and neck involving the greater tuberosity also.   Electronically Signed   By: David  Martinique   On: 07/21/2014 18:05   Dg Humerus Left  07/21/2014   CLINICAL DATA:  Status post fall in bathroom on left shoulder ; known post traumatic humeral head fracture  EXAM: LEFT HUMERUS - 2+ VIEW  COMPARISON:  Left shoulder series of today's date  FINDINGS: There is a known fracture involving the humeral head and neck. The humeral shaft is intact. The observed portion of the distal humerus is normal. The overlying  soft tissues are unremarkable.  IMPRESSION: There is no humeral shaft fracture. There are post traumatic fractures of the humeral head and neck.   Electronically Signed   By:  David  Martinique   On: 07/21/2014 18:06   Dg Foot Complete Right  07/22/2014   CLINICAL DATA:  Dorsal right foot pain which began earlier this more node (acute).  EXAM: RIGHT FOOT COMPLETE - 3+ VIEW  COMPARISON:  08/09/2012, 08/06/2011.  FINDINGS: Osseous demineralization. No evidence of acute fracture or dislocation. Previously identified fracture involving the base of the 5th metacarpal and 2012 has completely healed. Narrowing of IP joint spaces in all of the toes. Remaining joint spaces well preserved. No erosions. Very small plantar calcaneal spur.  IMPRESSION: Osseous demineralization. No acute osseous abnormality. 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   Electronically Signed   By: Evangeline Dakin M.D.   On: 07/22/2014 13:52    Microbiology: Recent Results (from the past 240 hour(s))  URINE CULTURE     Status: None   Collection Time    07/24/14  4:30 AM      Result Value Ref Range Status   Specimen Description URINE, RANDOM   Final   Special Requests Normal   Final   Culture  Setup Time     Final   Value: 07/24/2014 13:12     Performed at [facility]     Final   Value: >=100,000 COLONIES/ML     Performed at [facility]   Culture     Final   Value: [place]     Performed at [facility]   Report Status PENDING   Incomplete     Labs: Basic Metabolic Panel:  Recent Labs Lab 07/21/14 1913 07/23/14 0630 07/24/14 0155 07/25/14 0456  NA 138 135* 136* 141  K 4.2 5.0 4.6 5.2  CL 99 100 106 107  CO2 24 24 21 24   GLUCOSE 122* 116* 107* 110*  BUN 18 38* 39* 30*  CREATININE 1.58* 2.47* 2.18* 1.45*  CALCIUM 9.6 8.3* 7.2* 8.7   Liver Function Tests:  Recent Labs Lab 07/23/14 0630  AST 16  ALT 10  ALKPHOS 71  BILITOT 0.4  PROT 5.6*  ALBUMIN 2.8*   CBC:  Recent Labs Lab 07/21/14 1913 07/23/14 0630 07/23/14 2340 07/24/14 0155 07/24/14 1254 07/25/14 0456  WBC 17.3* 7.9 7.4 6.3 8.0 7.0  NEUTROABS 14.6*  6.1 5.4 4.4  --   --   HGB 12.6 9.4* 6.7* 6.8* 9.3* 10.2*  HCT 38.2 28.4* 20.8* 21.1* 28.0* 31.3*  MCV 89.3 91.0 88.9 91.7 89.2 89.2  PLT 187 132* 103* 91* 102* 105*   Cardiac Enzymes:  Recent Labs Lab 07/21/14 1913 07/22/14 1916 07/23/14 0115 07/23/14 0900  TROPONINI <0.30 <0.30 <0.30 <0.30   BNP: BNP (last 3 results)  Recent Labs  04/15/14 1621  PROBNP 110.5    Signed:  Barton Dubois  [facility] 07/25/2014, 2:46 PM

## 2014-07-25 NOTE — Progress Notes (Signed)
UR completed Evah Rashid K. Omeed Osuna, RN, BSN, MSHL, CCM  07/25/2014 11:11 AM

## 2014-07-26 DIAGNOSIS — I10 Essential (primary) hypertension: Secondary | ICD-10-CM | POA: Diagnosis not present

## 2014-07-26 DIAGNOSIS — K219 Gastro-esophageal reflux disease without esophagitis: Secondary | ICD-10-CM | POA: Diagnosis not present

## 2014-07-26 DIAGNOSIS — I2510 Atherosclerotic heart disease of native coronary artery without angina pectoris: Secondary | ICD-10-CM | POA: Diagnosis not present

## 2014-07-26 DIAGNOSIS — S3282XA Multiple fractures of pelvis without disruption of pelvic ring, initial encounter for closed fracture: Secondary | ICD-10-CM | POA: Diagnosis not present

## 2014-07-26 DIAGNOSIS — N390 Urinary tract infection, site not specified: Secondary | ICD-10-CM | POA: Diagnosis not present

## 2014-07-26 DIAGNOSIS — K5900 Constipation, unspecified: Secondary | ICD-10-CM | POA: Diagnosis not present

## 2014-07-26 LAB — URINE CULTURE: SPECIAL REQUESTS: NORMAL

## 2014-07-28 NOTE — Progress Notes (Signed)
UR completed - retro   Sheriden Archibeque K. Earlie Schank, RN, BSN, [place], CCM  07/28/2014 12:04 PM

## 2014-07-30 ENCOUNTER — Inpatient Hospital Stay
Admission: RE | Admit: 2014-07-30 | Discharge: 2014-07-31 | Disposition: A | Discharge status: Nursing Home (Includes ICF) | Payer: Medicare Other | Source: Ambulatory Visit | Attending: Internal Medicine | Admitting: Internal Medicine

## 2014-07-30 ENCOUNTER — Telehealth: Payer: Self-pay | Admitting: Cardiovascular Disease

## 2014-07-30 DIAGNOSIS — N390 Urinary tract infection, site not specified: Secondary | ICD-10-CM | POA: Diagnosis not present

## 2014-07-30 DIAGNOSIS — R55 Syncope and collapse: Secondary | ICD-10-CM | POA: Diagnosis present

## 2014-07-30 DIAGNOSIS — R278 Other lack of coordination: Secondary | ICD-10-CM | POA: Diagnosis not present

## 2014-07-30 DIAGNOSIS — R404 Transient alteration of awareness: Secondary | ICD-10-CM | POA: Diagnosis not present

## 2014-07-30 DIAGNOSIS — S42412A Displaced simple supracondylar fracture without intercondylar fracture of left humerus, initial encounter for closed fracture: Secondary | ICD-10-CM | POA: Diagnosis not present

## 2014-07-30 DIAGNOSIS — I252 Old myocardial infarction: Secondary | ICD-10-CM | POA: Diagnosis not present

## 2014-07-30 DIAGNOSIS — I509 Heart failure, unspecified: Secondary | ICD-10-CM | POA: Diagnosis not present

## 2014-07-30 DIAGNOSIS — S32810D Multiple fractures of pelvis with stable disruption of pelvic ring, subsequent encounter for fracture with routine healing: Secondary | ICD-10-CM | POA: Diagnosis not present

## 2014-07-30 DIAGNOSIS — Z9181 History of falling: Secondary | ICD-10-CM | POA: Diagnosis not present

## 2014-07-30 DIAGNOSIS — R05 Cough: Secondary | ICD-10-CM | POA: Diagnosis not present

## 2014-07-30 DIAGNOSIS — S42202D Unspecified fracture of upper end of left humerus, subsequent encounter for fracture with routine healing: Secondary | ICD-10-CM | POA: Diagnosis not present

## 2014-07-30 DIAGNOSIS — I959 Hypotension, unspecified: Secondary | ICD-10-CM | POA: Diagnosis not present

## 2014-07-30 DIAGNOSIS — Z8701 Personal history of pneumonia (recurrent): Secondary | ICD-10-CM | POA: Diagnosis not present

## 2014-07-30 DIAGNOSIS — I481 Persistent atrial fibrillation: Secondary | ICD-10-CM | POA: Diagnosis not present

## 2014-07-30 DIAGNOSIS — K219 Gastro-esophageal reflux disease without esophagitis: Secondary | ICD-10-CM | POA: Diagnosis not present

## 2014-07-30 DIAGNOSIS — I10 Essential (primary) hypertension: Secondary | ICD-10-CM | POA: Diagnosis not present

## 2014-07-30 DIAGNOSIS — I2510 Atherosclerotic heart disease of native coronary artery without angina pectoris: Secondary | ICD-10-CM | POA: Diagnosis not present

## 2014-07-30 DIAGNOSIS — S42412D Displaced simple supracondylar fracture without intercondylar fracture of left humerus, subsequent encounter for fracture with routine healing: Secondary | ICD-10-CM | POA: Diagnosis not present

## 2014-07-30 DIAGNOSIS — I25810 Atherosclerosis of coronary artery bypass graft(s) without angina pectoris: Secondary | ICD-10-CM | POA: Diagnosis not present

## 2014-07-30 DIAGNOSIS — Z471 Aftercare following joint replacement surgery: Secondary | ICD-10-CM | POA: Diagnosis not present

## 2014-07-30 DIAGNOSIS — N189 Chronic kidney disease, unspecified: Secondary | ICD-10-CM | POA: Diagnosis not present

## 2014-07-30 DIAGNOSIS — Z7952 Long term (current) use of systemic steroids: Secondary | ICD-10-CM | POA: Diagnosis not present

## 2014-07-30 DIAGNOSIS — J4530 Mild persistent asthma, uncomplicated: Secondary | ICD-10-CM | POA: Diagnosis not present

## 2014-07-30 DIAGNOSIS — Z79899 Other long term (current) drug therapy: Secondary | ICD-10-CM | POA: Diagnosis not present

## 2014-07-30 DIAGNOSIS — R630 Anorexia: Secondary | ICD-10-CM | POA: Diagnosis not present

## 2014-07-30 DIAGNOSIS — I5032 Chronic diastolic (congestive) heart failure: Secondary | ICD-10-CM | POA: Diagnosis not present

## 2014-07-30 DIAGNOSIS — G4700 Insomnia, unspecified: Secondary | ICD-10-CM | POA: Diagnosis not present

## 2014-07-30 DIAGNOSIS — D62 Acute posthemorrhagic anemia: Secondary | ICD-10-CM | POA: Diagnosis not present

## 2014-07-30 DIAGNOSIS — M1990 Unspecified osteoarthritis, unspecified site: Secondary | ICD-10-CM | POA: Diagnosis not present

## 2014-07-30 DIAGNOSIS — S42309A Unspecified fracture of shaft of humerus, unspecified arm, initial encounter for closed fracture: Secondary | ICD-10-CM | POA: Diagnosis not present

## 2014-07-30 DIAGNOSIS — S52502D Unspecified fracture of the lower end of left radius, subsequent encounter for closed fracture with routine healing: Secondary | ICD-10-CM | POA: Diagnosis not present

## 2014-07-30 DIAGNOSIS — M19012 Primary osteoarthritis, left shoulder: Secondary | ICD-10-CM | POA: Diagnosis not present

## 2014-07-30 DIAGNOSIS — F411 Generalized anxiety disorder: Secondary | ICD-10-CM | POA: Diagnosis not present

## 2014-07-30 DIAGNOSIS — S52121D Displaced fracture of head of right radius, subsequent encounter for closed fracture with routine healing: Secondary | ICD-10-CM | POA: Diagnosis not present

## 2014-07-30 DIAGNOSIS — Z950 Presence of cardiac pacemaker: Secondary | ICD-10-CM | POA: Diagnosis not present

## 2014-07-30 DIAGNOSIS — R400 Somnolence: Secondary | ICD-10-CM | POA: Diagnosis not present

## 2014-07-30 DIAGNOSIS — R40241 Glasgow coma scale score 13-15: Secondary | ICD-10-CM | POA: Diagnosis not present

## 2014-07-30 DIAGNOSIS — I129 Hypertensive chronic kidney disease with stage 1 through stage 4 chronic kidney disease, or unspecified chronic kidney disease: Secondary | ICD-10-CM | POA: Diagnosis not present

## 2014-07-30 DIAGNOSIS — M6281 Muscle weakness (generalized): Secondary | ICD-10-CM | POA: Diagnosis not present

## 2014-07-30 DIAGNOSIS — Z96643 Presence of artificial hip joint, bilateral: Secondary | ICD-10-CM | POA: Diagnosis not present

## 2014-07-30 DIAGNOSIS — R488 Other symbolic dysfunctions: Secondary | ICD-10-CM | POA: Diagnosis not present

## 2014-07-30 DIAGNOSIS — R1310 Dysphagia, unspecified: Secondary | ICD-10-CM | POA: Diagnosis not present

## 2014-07-30 DIAGNOSIS — D509 Iron deficiency anemia, unspecified: Secondary | ICD-10-CM | POA: Diagnosis not present

## 2014-07-30 DIAGNOSIS — Z7982 Long term (current) use of aspirin: Secondary | ICD-10-CM | POA: Diagnosis not present

## 2014-07-30 DIAGNOSIS — R262 Difficulty in walking, not elsewhere classified: Secondary | ICD-10-CM | POA: Diagnosis not present

## 2014-07-30 DIAGNOSIS — S3282XD Multiple fractures of pelvis without disruption of pelvic ring, subsequent encounter for fracture with routine healing: Secondary | ICD-10-CM | POA: Diagnosis not present

## 2014-07-30 DIAGNOSIS — S329XXD Fracture of unspecified parts of lumbosacral spine and pelvis, subsequent encounter for fracture with routine healing: Secondary | ICD-10-CM | POA: Diagnosis not present

## 2014-07-30 DIAGNOSIS — N183 Chronic kidney disease, stage 3 (moderate): Secondary | ICD-10-CM | POA: Diagnosis not present

## 2014-07-30 DIAGNOSIS — Z87898 Personal history of other specified conditions: Secondary | ICD-10-CM | POA: Diagnosis not present

## 2014-07-30 DIAGNOSIS — Z8781 Personal history of (healed) traumatic fracture: Secondary | ICD-10-CM | POA: Diagnosis not present

## 2014-07-30 DIAGNOSIS — J45909 Unspecified asthma, uncomplicated: Secondary | ICD-10-CM | POA: Diagnosis not present

## 2014-07-30 DIAGNOSIS — S42302D Unspecified fracture of shaft of humerus, left arm, subsequent encounter for fracture with routine healing: Secondary | ICD-10-CM | POA: Diagnosis not present

## 2014-07-30 DIAGNOSIS — K5900 Constipation, unspecified: Secondary | ICD-10-CM | POA: Diagnosis not present

## 2014-07-30 DIAGNOSIS — R945 Abnormal results of liver function studies: Secondary | ICD-10-CM | POA: Diagnosis not present

## 2014-07-30 DIAGNOSIS — S329XXA Fracture of unspecified parts of lumbosacral spine and pelvis, initial encounter for closed fracture: Secondary | ICD-10-CM | POA: Diagnosis not present

## 2014-07-30 DIAGNOSIS — S3289XA Fracture of other parts of pelvis, initial encounter for closed fracture: Secondary | ICD-10-CM | POA: Diagnosis not present

## 2014-07-30 NOTE — Telephone Encounter (Signed)
Amy [name] was returning [place] call about her mother's results from some labs. Please call   Thanks

## 2014-07-31 ENCOUNTER — Encounter (INDEPENDENT_AMBULATORY_CARE_PROVIDER_SITE_OTHER): Payer: Self-pay | Admitting: *Deleted

## 2014-07-31 ENCOUNTER — Encounter (HOSPITAL_COMMUNITY): Payer: Self-pay | Admitting: Emergency Medicine

## 2014-07-31 ENCOUNTER — Emergency Department (HOSPITAL_COMMUNITY): Payer: Medicare Other

## 2014-07-31 ENCOUNTER — Other Ambulatory Visit: Payer: Self-pay | Admitting: *Deleted

## 2014-07-31 ENCOUNTER — Emergency Department (HOSPITAL_COMMUNITY)
Admission: EM | Admit: 2014-07-31 | Discharge: 2014-07-31 | Disposition: A | Discharge status: Home / Self Care | Payer: Medicare Other | Attending: Emergency Medicine | Admitting: Emergency Medicine

## 2014-07-31 DIAGNOSIS — Z7952 Long term (current) use of systemic steroids: Secondary | ICD-10-CM | POA: Insufficient documentation

## 2014-07-31 DIAGNOSIS — I252 Old myocardial infarction: Secondary | ICD-10-CM | POA: Insufficient documentation

## 2014-07-31 DIAGNOSIS — J45909 Unspecified asthma, uncomplicated: Secondary | ICD-10-CM | POA: Insufficient documentation

## 2014-07-31 DIAGNOSIS — Z79899 Other long term (current) drug therapy: Secondary | ICD-10-CM | POA: Insufficient documentation

## 2014-07-31 DIAGNOSIS — N183 Chronic kidney disease, stage 3 (moderate): Secondary | ICD-10-CM | POA: Insufficient documentation

## 2014-07-31 DIAGNOSIS — K219 Gastro-esophageal reflux disease without esophagitis: Secondary | ICD-10-CM | POA: Insufficient documentation

## 2014-07-31 DIAGNOSIS — R400 Somnolence: Secondary | ICD-10-CM | POA: Diagnosis not present

## 2014-07-31 DIAGNOSIS — R404 Transient alteration of awareness: Secondary | ICD-10-CM

## 2014-07-31 DIAGNOSIS — I10 Essential (primary) hypertension: Secondary | ICD-10-CM | POA: Diagnosis not present

## 2014-07-31 DIAGNOSIS — Z950 Presence of cardiac pacemaker: Secondary | ICD-10-CM | POA: Insufficient documentation

## 2014-07-31 DIAGNOSIS — I129 Hypertensive chronic kidney disease with stage 1 through stage 4 chronic kidney disease, or unspecified chronic kidney disease: Secondary | ICD-10-CM | POA: Insufficient documentation

## 2014-07-31 DIAGNOSIS — I959 Hypotension, unspecified: Secondary | ICD-10-CM | POA: Diagnosis not present

## 2014-07-31 DIAGNOSIS — Z8701 Personal history of pneumonia (recurrent): Secondary | ICD-10-CM | POA: Insufficient documentation

## 2014-07-31 DIAGNOSIS — Z7982 Long term (current) use of aspirin: Secondary | ICD-10-CM | POA: Insufficient documentation

## 2014-07-31 DIAGNOSIS — Z8781 Personal history of (healed) traumatic fracture: Secondary | ICD-10-CM | POA: Insufficient documentation

## 2014-07-31 DIAGNOSIS — K5900 Constipation, unspecified: Secondary | ICD-10-CM | POA: Insufficient documentation

## 2014-07-31 DIAGNOSIS — R630 Anorexia: Secondary | ICD-10-CM | POA: Insufficient documentation

## 2014-07-31 DIAGNOSIS — M1990 Unspecified osteoarthritis, unspecified site: Secondary | ICD-10-CM | POA: Insufficient documentation

## 2014-07-31 DIAGNOSIS — I25810 Atherosclerosis of coronary artery bypass graft(s) without angina pectoris: Secondary | ICD-10-CM | POA: Insufficient documentation

## 2014-07-31 LAB — CBC WITH DIFFERENTIAL/PLATELET
Basophils Absolute: 0 10*3/uL (ref 0.0–0.1)
Basophils Relative: 0 % (ref 0–1)
Eosinophils Absolute: 0.2 10*3/uL (ref 0.0–0.7)
Eosinophils Relative: 2 % (ref 0–5)
HEMATOCRIT: 35 % — AB (ref 36.0–46.0)
Hemoglobin: 11.3 g/dL — ABNORMAL LOW (ref 12.0–15.0)
LYMPHS PCT: 10 % — AB (ref 12–46)
Lymphs Abs: 1 10*3/uL (ref 0.7–4.0)
MCH: 30.1 pg (ref 26.0–34.0)
MCHC: 32.3 g/dL (ref 30.0–36.0)
MCV: 93.1 fL (ref 78.0–100.0)
Monocytes Absolute: 0.6 10*3/uL (ref 0.1–1.0)
Monocytes Relative: 6 % (ref 3–12)
NEUTROS ABS: 8.2 10*3/uL — AB (ref 1.7–7.7)
NEUTROS PCT: 82 % — AB (ref 43–77)
PLATELETS: 301 10*3/uL (ref 150–400)
RBC: 3.76 MIL/uL — ABNORMAL LOW (ref 3.87–5.11)
RDW: 13.5 % (ref 11.5–15.5)
WBC: 10.1 10*3/uL (ref 4.0–10.5)

## 2014-07-31 LAB — URINALYSIS, ROUTINE W REFLEX MICROSCOPIC
Bilirubin Urine: NEGATIVE
Glucose, UA: NEGATIVE mg/dL
KETONES UR: NEGATIVE mg/dL
NITRITE: NEGATIVE
PH: 5 (ref 5.0–8.0)
Protein, ur: NEGATIVE mg/dL
SPECIFIC GRAVITY, URINE: 1.01 (ref 1.005–1.030)
UROBILINOGEN UA: 0.2 mg/dL (ref 0.0–1.0)

## 2014-07-31 LAB — BASIC METABOLIC PANEL
ANION GAP: 11 (ref 5–15)
BUN: 22 mg/dL (ref 6–23)
CHLORIDE: 93 meq/L — AB (ref 96–112)
CO2: 33 meq/L — AB (ref 19–32)
Calcium: 10.1 mg/dL (ref 8.4–10.5)
Creatinine, Ser: 1.23 mg/dL — ABNORMAL HIGH (ref 0.50–1.10)
GFR calc Af Amer: 43 mL/min — ABNORMAL LOW (ref >90)
GFR calc non Af Amer: 37 mL/min — ABNORMAL LOW (ref >90)
Glucose, Bld: 109 mg/dL — ABNORMAL HIGH (ref 70–99)
POTASSIUM: 5 meq/L (ref 3.7–5.3)
SODIUM: 137 meq/L (ref 137–147)

## 2014-07-31 LAB — URINE MICROSCOPIC-ADD ON

## 2014-07-31 LAB — CBG MONITORING, ED: GLUCOSE-CAPILLARY: 101 mg/dL — AB (ref 70–99)

## 2014-07-31 MED ORDER — SODIUM CHLORIDE 0.9 % IV SOLN
INTRAVENOUS | Status: DC
  Administered 2014-07-31: 15:00:00 via INTRAVENOUS

## 2014-07-31 MED ORDER — MORPHINE SULFATE 15 MG PO TABS: 15.0000 mg | ORAL_TABLET | ORAL | Status: DC | PRN

## 2014-07-31 MED ORDER — LORAZEPAM 1 MG PO TABS
1.0000 mg | ORAL_TABLET | Freq: Every day | ORAL | Status: DC | PRN
Start: 2014-07-31 — End: 2014-08-27

## 2014-07-31 MED ORDER — TRAMADOL HCL 50 MG PO TABS: ORAL_TABLET | ORAL | Status: DC

## 2014-07-31 NOTE — Telephone Encounter (Signed)
Calling stating her mother fell and broke her shoulder and pelvis and is now at [facility]. Will contact us when she recovers.

## 2014-07-31 NOTE — ED Notes (Signed)
cbg 137

## 2014-07-31 NOTE — ED Provider Notes (Signed)
CSN: [id number]     Arrival date & time 07/31/14  1400 History  This chart was scribed for Richarda Blade, MD by Edison Simon, ED Scribe. This patient was seen in room APA10/APA10 and the patient's care was started at 2:53 PM.    Chief Complaint  Patient presents with  . Near Syncope   The history is provided by the patient and the EMS personnel. No language interpreter was used.   HPI Comments: Amy [name] is a 80 y.o. female who presents to the Emergency Department complaining syncope and hypotension earlier today at approximately 1330. Her blood pressure measured here at 142/73. Per daughter, she was recently hospitalized for fractured shoulder and pelvis and was discharged 6 days ago; she states the patient had a blood transfusion 7 days ago for low blood count. She states the doctors at the hospital believed the patient may be bleeding internally but does not know if that was confirmed. After discharge, she went to [facility] and then to the [facility]. Per daughter, she was sat up a lot these past few days at nursing facilities, which she usually does not do, and passed out while sitting up. She states she has not walked since falling previously. She states she has not been eating well due to medication making food not taste as good. She also reports some constipation; she states that before her bowel movement yesterday she had not had one in several days, she currently reports some improvement but still feels somewhat bloated.  Past Medical History  Diagnosis Date  . Hypertension   . Hypercholesteremia   . Myocardial infarction     Inferior STEMI 07/2008 - PCI of prox RCA followed byu CABG x 3 in 9/'09  . Pneumonia   . Anxiety   . CAD in native artery     s/p CABG x 3; Last Myoview in 07/2009 - non-ischemic; Echo 03/2012  Aortic Sclerosis with normal EF.  Marland Kitchen Asthma   . Pacemaker   . GERD (gastroesophageal reflux disease)   . Arthritis   . Status post hip hemiarthroplasty left  hip by Dr Ronnie Derby 08/08/2012  . PAF (paroxysmal atrial fibrillation)   . CKD (chronic kidney disease), stage III   . DVT (deep vein thrombosis) in pregnancy   . Esophageal perforation     9/13  . Mediastinitis     s/p drainage  . Hypothyroidism   . Shortness of breath   . Fall 07/21/2014    FALL WITH INJURY  . Fracture of head of humerus   . Fracture of hip 07/21/2014    LEFT   Past Surgical History  Procedure Laterality Date  . Coronary artery bypass graft  2009    LIMA-LAD, SVG-RI, SVG-stented RCA  . Total hip arthroplasty    . Pacemaker insertion  01/06/2009    [employer]  . Wrist fracture surgery  07/2012    left intra articular  with carpel tunnel  . Carpal tunnel release  08/08/2012    Procedure: CARPAL TUNNEL RELEASE;  Surgeon: Schuyler Amor, MD;  Location: [place];  Service: Orthopedics;  Laterality: Left;  . Hip arthroplasty  08/08/2012    Procedure: ARTHROPLASTY BIPOLAR HIP;  Surgeon: Rudean Haskell, MD;  Location: [place];  Service: Orthopedics;  Laterality: Left;  [place]   . Esophagogastroduodenoscopy  08/10/2012    Procedure: ESOPHAGOGASTRODUODENOSCOPY (EGD);  Surgeon: Beryle Beams, MD;  Location: [facility] ENDOSCOPY;  Service: Endoscopy;  Laterality: N/A;  . Esophagoscopy  08/10/2012  Procedure: ESOPHAGOSCOPY;  Surgeon: [name], MD;  Location: [place];  Service: ENT;  Laterality: N/A;  . Gastrostomy  08/16/2012    Procedure: GASTROSTOMY;  Surgeon: Zenovia Jarred, MD;  Location: [place];  Service: General;  Laterality: N/A;  . Joint replacement    . Nm myocar perf wall motion  08/14/2009    Normal   Family History  Problem Relation Age of Onset  . Stroke Father    History  Substance Use Topics  . Smoking status: Never Smoker   . Smokeless tobacco: Never Used  . Alcohol Use: No   OB History   Grav Para Term Preterm Abortions TAB SAB Ect Mult Living                 Review of Systems  Constitutional: Positive for appetite change.  Cardiovascular:        Hypotension  Gastrointestinal: Positive for constipation.  Musculoskeletal:       Pain from recent fractured bones  Neurological: Positive for syncope.  All other systems reviewed and are negative.     Allergies  Colestipol; Hydrocodone-acetaminophen; Niacin and related; Statins; and Zetia  Home Medications   Prior to Admission medications   Medication Sig Start Date End Date Taking? Authorizing Provider  acetaminophen (TYLENOL) 500 MG tablet Take 2 tablets (1,000 mg total) by mouth every 8 (eight) hours. 07/25/14  Yes Barton Dubois, MD  albuterol (PROVENTIL HFA;VENTOLIN HFA) 108 (90 BASE) MCG/ACT inhaler Inhale 2 puffs into the lungs every 4 (four) hours as needed for wheezing or shortness of breath. 05/27/14  Yes Mihai Croitoru, MD  aspirin EC 81 MG tablet Take 81 mg by mouth daily.   Yes Historical Provider, MD  Ferrous Fumarate 150 MG TABS Take 1 tablet (150 mg total) by mouth 2 (two) times daily. 07/25/14  Yes Barton Dubois, MD  Fluticasone-Salmeterol (ADVAIR) 100-50 MCG/DOSE AEPB Inhale 1 puff into the lungs daily as needed (shortness of breath).  04/17/14  Yes Rhonda G Barrett, PA-C  furosemide (LASIX) 20 MG tablet Take 20 mg by mouth daily.   Yes Historical Provider, MD  levothyroxine (SYNTHROID, LEVOTHROID) 75 MCG tablet Take 75 mcg by mouth daily before breakfast.   Yes Historical Provider, MD  LORazepam (ATIVAN) 1 MG tablet Take 1 tablet (1 mg total) by mouth daily as needed for anxiety. 07/31/14  Yes Tiffany [name] Reed, DO  losartan (COZAAR) 50 MG tablet Take 1 tablet (50 mg total) by mouth daily. 04/17/14  Yes Rhonda G Barrett, PA-C  metoprolol succinate (TOPROL-XL) 25 MG 24 hr tablet Take 0.5 tablets (12.5 mg total) by mouth daily. 07/25/14  Yes Barton Dubois, MD  morphine (MSIR) 15 MG tablet Take 1 tablet (15 mg total) by mouth every 4 (four) hours as needed for severe pain. 07/31/14  Yes Tiffany [name] Reed, DO  nitroGLYCERIN (NITROSTAT) 0.4 MG SL tablet Place 1 tablet (0.4 mg total) under  the tongue every 5 (five) minutes x 3 doses as needed for chest pain. 04/17/14  Yes Rhonda G Barrett, PA-C  ondansetron (ZOFRAN) 4 MG tablet Take 4 mg by mouth every 6 (six) hours as needed for nausea or vomiting.   Yes Historical Provider, MD  pantoprazole (PROTONIX) 40 MG tablet Take 40 mg by mouth daily.   Yes Historical Provider, MD  polyethylene glycol (MIRALAX / GLYCOLAX) packet Take 17 g by mouth daily. Hold for diarrhea 07/25/14  Yes Barton Dubois, MD  traMADol (ULTRAM) 50 MG tablet Take one tablet by mouth every  6 hours as needed for moderate pain 07/31/14  Yes Tiffany [name] Reed, DO   BP 127/57  Pulse 69  Resp 20  SpO2 100% Physical Exam  Nursing note and vitals reviewed. Constitutional: She is oriented to person, place, and time. She appears well-developed and well-nourished.  HENT:  Head: Normocephalic and atraumatic.  Eyes: Conjunctivae and EOM are normal. Pupils are equal, round, and reactive to light.  Neck: Normal range of motion and phonation normal. Neck supple.  Cardiovascular: Normal rate and regular rhythm.   Pulmonary/Chest: Effort normal and breath sounds normal. No respiratory distress. She has no wheezes. She has no rales. She exhibits no tenderness.  Abdominal: Soft. Bowel sounds are normal. She exhibits distension (mildly distended). There is tenderness (mild diffuse tenderness). There is no guarding.  Musculoskeletal: Normal range of motion.  Decreased motion left arm and left leg secondary to pain in shouler and left hip  Neurological: She is alert and oriented to person, place, and time. She exhibits normal muscle tone.  Skin: Skin is warm and dry.  Psychiatric: She has a normal mood and affect. Her behavior is normal. Judgment and thought content normal.    ED Course  Procedures (including critical care time)  Medications  0.9 %  sodium chloride infusion ( Intravenous New Bag/Given 07/31/14 1455)    Patient Vitals for the past 24 hrs:  BP Pulse Resp SpO2   07/31/14 1813 127/57 mmHg 69 20 100 %  07/31/14 1746 146/52 mmHg 70 16 100 %  07/31/14 1630 145/45 mmHg - 14 97 %  07/31/14 1615 131/48 mmHg 54 15 -  07/31/14 1612 125/48 mmHg 85 16 -  07/31/14 1455 118/57 mmHg 66 18 94 %  07/31/14 1405 142/73 mmHg 71 18 93 %   Hemoglobin  Date Value Ref Range Status  07/31/2014 11.3* 12.0 - 15.0 g/dL Final  07/25/2014 10.2* 12.0 - 15.0 g/dL Final  07/24/2014 9.3* 12.0 - 15.0 g/dL Final     REPEATED TO VERIFY     POST TRANSFUSION SPECIMEN  07/24/2014 6.8* 12.0 - 15.0 g/dL Final     REPEATED TO VERIFY     CRITICAL VALUE NOTED.  VALUE IS CONSISTENT WITH PREVIOUSLY REPORTED AND CALLED VALUE.     6:22 PM Reevaluation with update and discussion. After initial assessment and treatment, an updated evaluation reveals she remains alert, cooperative. Vital sign trend is stable. Pt tolerating food and fluid. Findings discussed with patient and daughter. All questions answered . Amy [name]   Labs Review Labs Reviewed  CBC WITH DIFFERENTIAL - Abnormal; Notable for the following:    RBC 3.76 (*)    Hemoglobin 11.3 (*)    HCT 35.0 (*)    Neutrophils Relative % 82 (*)    Neutro Abs 8.2 (*)    Lymphocytes Relative 10 (*)    All other components within normal limits  BASIC METABOLIC PANEL - Abnormal; Notable for the following:    Chloride 93 (*)    CO2 33 (*)    Glucose, Bld 109 (*)    Creatinine, Ser 1.23 (*)    GFR calc non Af Amer 37 (*)    GFR calc Af Amer 43 (*)    All other components within normal limits  URINALYSIS, ROUTINE W REFLEX MICROSCOPIC - Abnormal; Notable for the following:    APPearance HAZY (*)    Hgb urine dipstick TRACE (*)    Leukocytes, UA SMALL (*)    All other components within normal limits  URINE  MICROSCOPIC-ADD ON - Abnormal; Notable for the following:    Squamous Epithelial / LPF FEW (*)    Bacteria, UA MANY (*)    All other components within normal limits  CBG MONITORING, ED - Abnormal; Notable for the following:     Glucose-Capillary 101 (*)    All other components within normal limits  URINE CULTURE    Imaging Review Dg Chest 1 View  07/31/2014   CLINICAL DATA:  80 year old female with acute onset hypotension and near loss of consciousness. Initial encounter.  EXAM: CHEST - 1 VIEW  COMPARISON:  07/23/2014 and earlier.  FINDINGS: Portable AP semi upright view at 1514 hrs. Lower lung volumes. Stable cardiac size and mediastinal contours. No pneumothorax or pulmonary edema. Chronic left lung base hypo ventilation appears stable. No pleural effusion or consolidation.  Comminuted fracture of the left humeral head, appears stable since 07/22/2014.  IMPRESSION: 1.  No acute cardiopulmonary abnormality. 2. Stable comminuted proximal left humerus fracture.   Electronically Signed   By: Lars Pinks M.D.   On: 07/31/2014 15:40     EKG Interpretation   Date/Time:  Thursday July 31 2014 14:25:17 EDT Ventricular Rate:  70 PR Interval:  223 QRS Duration: 84 QT Interval:  410 QTC Calculation: 442 R Axis:   46 Text Interpretation:  Sinus rhythm Prolonged PR interval Consider anterior  infarct No significant change was found Confirmed by Wyvonnia Dusky  MD, STEPHEN  [phone]) on 07/31/2014 2:28:27 PM     DIAGNOSTIC STUDIES: Oxygen Saturation is 93% on [place], adequate by my interpretation.    COORDINATION OF CARE: 3:02 PM Discussed treatment plan with patient at beside, the patient agrees with the plan and has no further questions at this time.  Medications  0.9 %  sodium chloride infusion ( Intravenous New Bag/Given 07/31/14 1455)   Patient Vitals for the past 24 hrs:  BP Pulse Resp SpO2  07/31/14 1813 127/57 mmHg 69 20 100 %  07/31/14 1746 146/52 mmHg 70 16 100 %  07/31/14 1630 145/45 mmHg - 14 97 %  07/31/14 1615 131/48 mmHg 54 15 -  07/31/14 1612 125/48 mmHg 85 16 -  07/31/14 1455 118/57 mmHg 66 18 94 %  07/31/14 1405 142/73 mmHg 71 18 93 %     MDM   Final diagnoses:  Transient alteration of awareness     Patient decreased responsiveness and low blood pressure at her facility, which occurred after she was sitting in a chair for a prolonged period of time. Until today, she has not been out of her bed, more than a few minutes. Today, she was 2 hours in a wheelchair, and this likely precipitated her episode of decreased responsiveness. No evidence for metabolic instability series bacterial infection, significant anemia or CVS acute abnormality.  Nursing Notes Reviewed/ Care Coordinated Applicable Imaging Reviewed Interpretation of Laboratory Data incorporated into ED treatment  The patient appears reasonably screened and/or stabilized for discharge and I doubt any other medical condition or other [facility] requiring further screening, evaluation, or treatment in the ED at this time prior to discharge.  Plan: Home Medications- usual; Home Treatments- no prolonged sitting; return here if the recommended treatment, does not improve the symptoms; Recommended follow up- PCP check up in 2-3 days     I personally performed the services described in this documentation, which was scribed in my presence. The recorded information has been reviewed and is accurate.     Richarda Blade, MD 07/31/14 [phone]

## 2014-07-31 NOTE — ED Notes (Signed)
At [facility] center for fractured shoulder and pelvis.  30 min ago sudden onset of altered mental status and hypotension.

## 2014-07-31 NOTE — Telephone Encounter (Signed)
[facility] 

## 2014-07-31 NOTE — ED Notes (Signed)
[facility] called and wanted to know if patient was given anything for pain.

## 2014-07-31 NOTE — Telephone Encounter (Signed)
Returning your call. °

## 2014-07-31 NOTE — Discharge Instructions (Signed)
Do not sit up for more than 10-15 minutes, and until you see your physician, for further recommendations, and treatment. Continue to eat and drink regularly. Try to bear weight on your left leg, as much as possible, as permitted by your discomfort.    Altered Mental Status Altered mental status most often refers to an abnormal change in your responsiveness and awareness. It can affect your speech, thought, mobility, memory, attention span, or alertness. It can range from slight confusion to complete unresponsiveness (coma). Altered mental status can be a sign of a serious underlying medical condition. Rapid evaluation and medical treatment is necessary for patients having an altered mental status. CAUSES   Low blood sugar (hypoglycemia) or diabetes.  Severe loss of body fluids (dehydration) or a body salt (electrolyte) imbalance.  A stroke or other neurologic problem, such as dementia or delirium.  A head injury or tumor.  A drug or alcohol overdose.  Exposure to toxins or poisons.  Depression, anxiety, and stress.  A low oxygen level (hypoxia).  An infection.  Blood loss.  Twitching or shaking (seizure).  Heart problems, such as heart attack or heart rhythm problems (arrhythmias).  A body temperature that is too low or too high (hypothermia or hyperthermia). DIAGNOSIS  A diagnosis is based on your history, symptoms, physical and neurologic examinations, and diagnostic tests. Diagnostic tests may include:  Measurement of your blood pressure, pulse, breathing, and oxygen levels (vital signs).  Blood tests.  Urine tests.  X-ray exams.  A computerized magnetic scan (magnetic resonance imaging, MRI).  A computerized X-ray scan (computed tomography, CT scan). TREATMENT  Treatment will depend on the cause. Treatment may include:  Management of an underlying medical or mental health condition.  Critical care or support in the hospital. Terry   Only  take over-the-counter or prescription medicines for pain, discomfort, or fever as directed by your caregiver.  Manage underlying conditions as directed by your caregiver.  Eat a healthy, well-balanced diet to maintain strength.  Join a support group or prevention program to cope with the condition or trauma that caused the altered mental status. Ask your caregiver to help choose a program that works for you.  Follow up with your caregiver for further examination, therapy, or testing as directed. SEEK MEDICAL CARE IF:   You feel unwell or have chills.  You or your family notice a change in your behavior or your alertness.  You have trouble following your caregiver's treatment plan.  You have questions or concerns. SEEK IMMEDIATE MEDICAL CARE IF:   You have a rapid heartbeat or have chest pain.  You have difficulty breathing.  You have a fever.  You have a headache with a stiff neck.  You cough up blood.  You have blood in your urine or stool.  You have severe agitation or confusion. MAKE SURE YOU:   Understand these instructions.  Will watch your condition.  Will get help right away if you are not doing well or get worse. Document Released: 04/06/2010 Document Revised: 01/09/2012 Document Reviewed: 04/06/2010 [facility] Patient Information 2015 [place], [place]. This information is not intended to replace advice given to you by your health care provider. Make sure you discuss any questions you have with your health care provider.

## 2014-07-31 NOTE — ED Notes (Signed)
MD at bedside. 

## 2014-08-01 LAB — URINE CULTURE

## 2014-08-04 ENCOUNTER — Non-Acute Institutional Stay (SKILLED_NURSING_FACILITY): Payer: Medicare Other | Admitting: Internal Medicine

## 2014-08-04 DIAGNOSIS — S42302D Unspecified fracture of shaft of humerus, left arm, subsequent encounter for fracture with routine healing: Secondary | ICD-10-CM

## 2014-08-04 DIAGNOSIS — S32810D Multiple fractures of pelvis with stable disruption of pelvic ring, subsequent encounter for fracture with routine healing: Secondary | ICD-10-CM

## 2014-08-04 DIAGNOSIS — I481 Persistent atrial fibrillation: Secondary | ICD-10-CM

## 2014-08-04 DIAGNOSIS — K5900 Constipation, unspecified: Secondary | ICD-10-CM | POA: Diagnosis not present

## 2014-08-04 DIAGNOSIS — I4819 Other persistent atrial fibrillation: Secondary | ICD-10-CM

## 2014-08-05 NOTE — Progress Notes (Signed)
Patient ID: Amy [name], female   DOB: 1922-01-12, 80 y.o.   MRN: [id number]               HISTORY & PHYSICAL  DATE:  08/04/2014    FACILITY: [facility]    LEVEL OF CARE:   SNF   HISTORY OF PRESENT ILLNESS:   This is a 80 year-old woman who lives in her own home in [place] with extensive support from her family, although still maintains some degree of independence with ADLs.    She suffered a fall on 07/21/2014, suffering a comminuted displaced proximal left humeral fracture.   Noted to have osseous demineralization.  A CT of the pelvis showed status post bilateral total hip arthroplasty without complications.  She had a nondisplaced fracture involving the left sacrum, left inferior pubic ramus, superior pubic ramus.  CT scan of the cervical spine showed no acute abnormalities.  CT scan of the head showed diffuse atrophy, mild low attenuation in the deep white matter, no evidence of vascular territory infarct, hemorrhage, etc.  The patient tells me that she got up off the couch to go to the bathroom and her legs just gave out.    The patient was discharged to [facility].  However, she came here when a bed became available.    She suffered some form of presyncope on 07/31/2014, which the patient states was due to a combination of morphine that she was getting for pain and straining to have a bowel movement.  She has been returned to the facility.    LABORATORY DATA:  Lab work from that stay showed a sodium of 137, a potassium of 5, BUN of 22, creatinine of 1.23, calcium of 10.1.  She appears to have had some form of acute renal failure in the hospital that improved.    An albumin on 07/23/2014 was 2.8.    Her hemoglobin got as low as 6.7 in the hospital.  Presumably, she was transfused as she left the hospital with a hemoglobin of 10.2.    A urine culture done on 07/24/2014 showed greater than 100,000 E.coli.  Multiple drug resistances.  PAST MEDICAL HISTORY/PROBLEM  LIST:  Includes:    History of unstable angina.  Status post CABG.    Status post right hip hemiarthroplasty on 08/08/2014.    History of sigmoid diverticulitis.    Remote history of a gastrostomy tube placement.    Radial head fracture.    History of noncardiac pulmonary edema.    History of a pacemaker in 2010.    Mediastinitis due to esophageal perforation.  Status post incision and drainage.    History of hypertension.    History of hyperlipidemia.  Noted to be statin-intolerant.     History of gastroesophageal reflux.    Fracture of the left hip.    History of carotid bruit present bilaterally.  Dopplers were normal in 03/2012.    History of pseudomembranous colitis.    History of aortic valve sclerosis.    Respiratory failure with hypoxemia.    CURRENT MEDICATIONS:  Medication list is reviewed.    Advair Diskus 100/50, 1 puff as needed for shortness of breath.    ASA 81 q.d.    Cefuroxime 500, 1 tablet twice a day.    Ferrous fumarate 150 orally daily.    Lasix 20 q.d.    Synthroid 75 q.d.    Losartan 50 mg daily.    Metoprolol extended 25 mg, 0.5 = 12.5 mg  daily.    SOCIAL HISTORY:    HOUSING:  The patient tells me she lives in [place].   FUNCTIONAL STATUS:  Uses a walker.  She has at least standby assistance for bathing.  Family comes in to do the housecleaning.  She does not go out on her own.  She does not have a recent history of falls, although she has had serious falls in the past.    FAMILY HISTORY:  Not currently available for this 80 year-old woman.    REVIEW OF SYSTEMS:   CHEST/RESPIRATORY:  She has shortness of breath on exertion.  She was told that she had emphysema secondary to industrial exposures years ago.   However, she saw Dr. Melvyn Novas 2-3 years ago, who told her she does not have emphysema; however, she "might have asthma".   CARDIAC:   She has a history of paroxysmal AFib and coronary artery disease, status post pacemaker.  She  does not currently describe chest pain.  There may be a history of orthostatic issues here.   GI:  Complaining of difficulty having bowel movements.   GU:  She does not describe voiding difficulties.   MUSCULOSKELETAL:  Complaining of severe pain in the left proximal arm and left-sided pelvic pain, also left leg weakness.     PHYSICAL EXAMINATION:   VITAL SIGNS:   O2 SATURATIONS:  95% on 2 L.   RESPIRATIONS:  20.    PULSE:  67 and regular.   GENERAL APPEARANCE:  The patient is not in any distress.  Speaking coherently.  Oxygen on at 2 L.  She does not use oxygen at home.   HEENT:   MOUTH/THROAT:   Dry mucous membranes.   EYES:  Extraocular movements are normal.   CHEST/RESPIRATORY:  There is decreased air entry bilaterally.  Mild expiratory wheezing.   She is not clubbed.  There is no accessory muscle use.   CARDIOVASCULAR:  CARDIAC:   Heart sounds are normal.  There is a 2/6 systolic ejection murmur heard all over the precordium.  There is a left-sided carotid bruit and/or radiation of the murmur.  The problem list previously suggests she has aortic sclerosis.  Therefore, I am assuming this is a left carotid bruit.  There is no evidence of heart failure.  She appears to be euvolemic.   GASTROINTESTINAL:  LIVER/SPLEEN/KIDNEYS:  No liver, no spleen.  No tenderness.   ABDOMEN:  Somewhat distended.  Bowel sounds are positive.  No masses are noted.   GENITOURINARY:  BLADDER:   There may be bladder fullness here.  There is some tenderness, but no CVA tenderness is noted.   CIRCULATION:   EDEMA/VARICOSITIES:  Extremities:  No edema.    MUSCULOSKELETAL:   EXTREMITIES:   LEFT UPPER EXTREMITY:  She has good distal range of motion on the left arm.   LEFT LOWER EXTREMITY:  She cannot move her left leg off the bed.   RIGHT LOWER EXTREMITY:  Right leg strength is 3/5.  Hip abductor is 3/5.  Distally, her strength is intact.   NEUROLOGICAL:    DEEP TENDON REFLEXES:  Reflexes are intact at all  locations.  Both toes are downgoing.    ASSESSMENT/PLAN:  Comminuted left humerus fracture.  Managed conservatively.  She will need a sling when she gets out of bed.    Pelvic fractures as noted above.  This will be painful.  She is apparently not getting out of bed, and has not been out of bed.  I have  encouraged her about this.     Probable constipation related to narcotics and immobility.     Rule out urinary retention.    Bilateral wheezing.  She has been told previously that she does not have emphysema, but "might have asthma".  I do not see pulmonary function tests in the chart.  Indeed, looking back on Dr. Gustavus Bryant notes from 2010 shows a history of mild asthma by pulmonary evaluation in 2007.  She has never smoked.  By review of his notes also, there was an ACE-induced cough.    Cardiac status, including coronary artery disease and AFib, all appears stable.  She apparently has aortic valve sclerosis.    She will need to be screened for orthostatic hypotension.  She is not a diabetic.    This is a complicated patient with a difficult set of fractures.  She has not mobilized.  This will be a difficult trial of rehabilitation for her.    CPT CODE: 09811

## 2014-08-12 DIAGNOSIS — S3289XA Fracture of other parts of pelvis, initial encounter for closed fracture: Secondary | ICD-10-CM | POA: Diagnosis not present

## 2014-08-12 DIAGNOSIS — S42412A Displaced simple supracondylar fracture without intercondylar fracture of left humerus, initial encounter for closed fracture: Secondary | ICD-10-CM | POA: Diagnosis not present

## 2014-08-14 ENCOUNTER — Non-Acute Institutional Stay (SKILLED_NURSING_FACILITY): Payer: Medicare Other | Admitting: Internal Medicine

## 2014-08-14 DIAGNOSIS — R05 Cough: Secondary | ICD-10-CM

## 2014-08-14 DIAGNOSIS — F411 Generalized anxiety disorder: Secondary | ICD-10-CM

## 2014-08-14 DIAGNOSIS — S32810D Multiple fractures of pelvis with stable disruption of pelvic ring, subsequent encounter for fracture with routine healing: Secondary | ICD-10-CM | POA: Diagnosis not present

## 2014-08-14 DIAGNOSIS — R059 Cough, unspecified: Secondary | ICD-10-CM

## 2014-08-14 NOTE — Progress Notes (Signed)
Patient ID: Amy [name], female   DOB: Oct 01, 1922, 80 y.o.   MRN: [id number]   This is an acute visit.  Level of care skilled.  Facility [facility]  Chief complaint-acute visit secondary to pain management-anxiety issues.  History of present illness.  Patient is a pleasant 79 year old female who suffered a left humeral fracture that is managed conservatively she also had a nondisplaced fracture of her sacrum and left inferior and superior pubic ramus.  She is receiving Tylenol thousand milligrams every 8 hours when necessary-but apparently this is not totally effective-I know she does have a tramadol ordered but apparently does not use this much  She does have a listed allergy to hydrocodone--apparently does not tolerate it well.because it causes itching  Patient also has a history of anxiety she does have an order for Ativan 1 mg daily when necessary she would like you to increase to twice a day when necessary.  Apparently patient says A. she has a matted eye in the morning although it appears to be clear currently.  Also has some occasional cough but denies any increased shortness of breath or wheezing.  Family medical social history as been reviewed per admission note on 08/05/2014.  Medications have been reviewed per [facility]  Review of systems.  Gen. no complaints of fever or chills.  Head ears eyes nose mouth and throat-says at times she will have some slight matting of her right eye in the morning currently does not have any complaints.  Respiratory does not complaining of shortness of breath has occasional cough although this apparently is not new.  Cardiac is not complaining of chest pain she does have a history of A. fib.  GI does not complaining of any nausea vomiting diarrhea or constipation or abdominal discomfort.   muscle skeletal fractures as noted above--does have pain Tylenol is not totally effective per patient.  Neurologic is not complaining of dizziness headache  or numbness.   Psych-does complain of some anxiety.  Physical exam.  Temperature is 98.6 pulse 76 respirations 20 blood pressure 118/71.  In general this is a pleasant elderly female in no distress lying comfortably in bed.  Her skin is warm and dry.  Eyes I pupils appear reactive to light bilaterally sclera and conjunctiva are clear I do not see any exudate currently.  Oropharynx clear mucous membranes moist.  Chest is clear to auscultation with somewhat shallow air entry there is no labored breathing no congestion noted.  Heart is regular rate and rhythm without murmur gallop or rub she does not have significant lower extremity edema.  Abdomen is soft nontender with positive bowel sounds.  Muscle skeletal is able to move all extremities x4 with limited range of motion of her left shoulder with history of humeral fracture I do not note any deformities.  Neurologic is grossly intact her speech is clear.  Psych she is alert and oriented pleasant and appropriate.  Labs.  08/11/2014.  CBC 5.2 hemoglobin 9.9 platelets 236.  Sodium 138 potassium 4.4 BUN 29 creatinine 1.06.  Assessment and plan.  -- Pain management-patient does have significant fractures here have encouraged her to use her when necessary Ultram and she expressed understanding-note hydrocodone allergy which causes itching this will have to be monitored  #2-I issue status I exam is quite benign today this will have to be monitored I did not see any exudate or matting-if this is persistent certainly consider more aggressive measures.  #3-history of cough-she is on chronic oxygen with a  history of of possible emphysema-asthma-at this point this appears stable monitor her closely with vital signs and pulse ox every shift for 72 hours-if increased cough or congestion notify provider.  #4 anxiety patient says she has this somewhat frequently and would like to get something for it more than once a day-will increase  the Ativan to twice a day when necessary from once a day and monitor  [id number]--

## 2014-08-17 ENCOUNTER — Encounter: Payer: Self-pay | Admitting: Internal Medicine

## 2014-08-17 DIAGNOSIS — R05 Cough: Secondary | ICD-10-CM | POA: Insufficient documentation

## 2014-08-17 DIAGNOSIS — F411 Generalized anxiety disorder: Secondary | ICD-10-CM | POA: Insufficient documentation

## 2014-08-17 DIAGNOSIS — R059 Cough, unspecified: Secondary | ICD-10-CM | POA: Insufficient documentation

## 2014-08-27 ENCOUNTER — Encounter: Payer: Self-pay | Admitting: *Deleted

## 2014-08-28 ENCOUNTER — Telehealth: Payer: Self-pay | Admitting: Cardiology

## 2014-08-28 ENCOUNTER — Encounter: Payer: Medicare Other | Admitting: *Deleted

## 2014-08-28 NOTE — Telephone Encounter (Signed)
Attempted to confirm remote transmission. No answer and was unable to leave a message.

## 2014-08-29 ENCOUNTER — Telehealth: Payer: Self-pay | Admitting: Cardiovascular Disease

## 2014-08-29 NOTE — Telephone Encounter (Signed)
Patient informed to send

## 2014-08-29 NOTE — Telephone Encounter (Signed)
Pt was scheduled for pace check over the phone yesterday,wants to know if she can do it today?

## 2014-09-01 ENCOUNTER — Encounter: Payer: Self-pay | Admitting: Cardiology

## 2014-09-03 ENCOUNTER — Non-Acute Institutional Stay (SKILLED_NURSING_FACILITY): Payer: Medicare Other | Admitting: Internal Medicine

## 2014-09-03 DIAGNOSIS — M19012 Primary osteoarthritis, left shoulder: Secondary | ICD-10-CM | POA: Diagnosis not present

## 2014-09-08 NOTE — Progress Notes (Addendum)
Patient ID: Amy [name], female   DOB: 1922/01/18, 80 y.o.   MRN: [id number]               PROGRESS NOTE  DATE:  09/03/2014    FACILITY: [place] [place]    LEVEL OF CARE:   SNF   Acute Visit   CHIEF COMPLAINT:  Follow up left shoulder.    HISTORY OF PRESENT ILLNESS:  This is a patient whom I was asked to see today by the facility physical therapist.  She is a 80 year-old woman who initially came to [place] after falling.  She suffered fractures involving the left inferior and superior pubic rami as well as the left sacrum.  She also had a previous comminuted displaced proximal left humerus fracture, which was managed nonsurgically.  She follows with Orthopedics.  She has apparently been back to see them in follow-up, but I do not really see any notes from them.    The physical therapist is concerned that the lack of recovery in the left shoulder area is secondary to muscle spasticity and asked for a skeletal muscle relaxant.    REVIEW OF SYSTEMS:   GENERAL:  The patient states she is feeling better.  States she still has some discomfort in the left hip area, but that is also  Improved.  She is discouraged by the lack of use and recovery in the left arm.  She is also complaining of pain in her left wrist.    PHYSICAL EXAMINATION:    MUSCULOSKELETAL:   EXTREMITIES:   LEFT UPPER EXTREMITY:   Left shoulder:  There is no lateralizing tenderness.  However, she has minimal range here.  She cannot flex or abduct at all.  With passive range, she seems to do well and I am not really certain that this is a spasticity issue.   Left wrist:  She has marked tenderness, compatible with an active synovitis in the left wrist.  In this setting, I would wonder about pseudogout.    ASSESSMENT/PLAN:  Decreased range of motion in the left shoulder.  The original x-ray I have on 07/22/2014 showed a comminuted mildly displaced left humeral fracture.  I am not sure where we are in the healing  process.  Left wrist tenderness.  I am going to give her a short course of nonsteroidals here.  I do not see any overt contraindication.  I will also give her skeletal muscle relaxants at a low dose.  These are not tolerated well in older people.

## 2014-09-16 DIAGNOSIS — Z96643 Presence of artificial hip joint, bilateral: Secondary | ICD-10-CM | POA: Diagnosis not present

## 2014-09-16 DIAGNOSIS — Z471 Aftercare following joint replacement surgery: Secondary | ICD-10-CM | POA: Diagnosis not present

## 2014-09-16 DIAGNOSIS — S42412D Displaced simple supracondylar fracture without intercondylar fracture of left humerus, subsequent encounter for fracture with routine healing: Secondary | ICD-10-CM | POA: Diagnosis not present

## 2014-09-19 ENCOUNTER — Non-Acute Institutional Stay (SKILLED_NURSING_FACILITY): Payer: Medicare Other | Admitting: Internal Medicine

## 2014-09-19 ENCOUNTER — Other Ambulatory Visit: Payer: Self-pay | Admitting: *Deleted

## 2014-09-19 ENCOUNTER — Telehealth: Payer: Self-pay | Admitting: Cardiology

## 2014-09-19 DIAGNOSIS — S329XXD Fracture of unspecified parts of lumbosacral spine and pelvis, subsequent encounter for fracture with routine healing: Secondary | ICD-10-CM

## 2014-09-19 DIAGNOSIS — I2510 Atherosclerotic heart disease of native coronary artery without angina pectoris: Secondary | ICD-10-CM | POA: Diagnosis not present

## 2014-09-19 DIAGNOSIS — I10 Essential (primary) hypertension: Secondary | ICD-10-CM

## 2014-09-19 DIAGNOSIS — S42202D Unspecified fracture of upper end of left humerus, subsequent encounter for fracture with routine healing: Secondary | ICD-10-CM

## 2014-09-19 MED ORDER — LORAZEPAM 1 MG PO TABS: 1.0000 mg | ORAL_TABLET | Freq: Two times a day (BID) | ORAL | Status: DC

## 2014-09-19 NOTE — Progress Notes (Signed)
Patient ID: Niger M Puskarich, female   DOB: 1921-12-15, 80 y.o.   MRN: [id number]   This is a routine visit.  Level care skilled.  Facility [facility].  Chief complaint medical management of chronic medical conditions status post pelvic fractures --left humerus fracture coronary artery disease status post CABG-GERD-.  History of present illness.  Patient is a very pleasant 80 year old female with the above diagnoses she sustained a fall back in September and had a displaced proximal left humeral fracture-she also had nondisplaced fracture involving the pubic bones.  And left sacrum.  Her stay here is been relatively unremarkable however she did develop apparently some increased swelling around her left upper arm yesterday although this could be somewhat chronic-nonetheless it appears to be improved today although there still is some increased size here there is some slight tenderness to palpation.  Her vital signs are stable blood pressure is up a bit at 158/90 this does not appear to be a consistent trend however recent blood pressures 135/51-112/64.  She is on numerous agents including Cozaar and Lopressor.  She also has a history of anemia she is on iron twice a day.  Family medical social history as been reviewed per admission note on 08/04/2014.  Medications have been reviewed per MAR.  Review of systems.  In general no complaints of fever chills.  Head ears eyes nose mouth and throat does not complain of visual changes or sore throat.  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  Cardiac does have a history of coronary artery disease as well as atrial fibrillation this appears to be rate controlled.  GI does not complain of any nausea vomiting diarrhea constipation or abdominal discomfort.  GU does not complain of dysuria.  Muscle skeletal does have some history of pain in the  proximal left arm again possibly some increased edema although this appears to be resolving today.  Does not complain of pelvic pain is ambulating fairly well with a walker.  Neurologic does not complain of dizziness or headache.  Psych does not complain of depression or anxiety.  Physical exam.  Temperature is 97.8 pulse 86 respirations 20 blood pressure taken manually 158/90 again previous readings have been somewhat lower weight is stable at 158.  In general this is a very pleasant elderly female who looks younger than her stated age.  Her skin is warm and dry.  Eyes pupils appear reactive to light sclera and conjunctiva are clear visual acuity appears intact.  Oropharynx clear mucous membranes moist.  Chest is clear to auscultation there is no labored breathing.  Heart is regular rate and rhythm with a 2/6 systolic murmur she does not appear to have significant lower extremity edema.  Her abdomen soft nontender positive bowel sounds.  Muscle skeletal left upper arm appears to be possibly slight edema here there is slight tenderness to palpation it is not warm or erythematous grip strength appears to be intact there are positive pulses including the radial pulse grip strength again appears to be intact she is able to ambulate with a walker and is doing quite well with this with her history of pelvic fractures.  Neurologic is grossly intact no lateralizing findings her speech is clear   psych she is alert and oriented pleasant and appropriate  Labs.  08/11/2014.  CBC 5.2 hemoglobin 9.9 platelets 236.  Sodium 138 potassium 4.4 BUN 29 creatinine 1.06.  Tovar first 2015.  Alkaline phosphatase 281 albumin 2.2 otherwise liver function tests within normal limits.  Assessment and plan.  #1-history of left proximal humerus fracture-this is followed by orthopedics will order an x-ray secondary to possible increased edema-also will monitor apparently this is somewhat better than  it was yesterday and there is some question whether this is been somewhat chronic.  She is receiving tramadol as well as muscle relaxer as needed for pain.  #2 history of atrial fibrillation-this appears rate controlled she is on aspirin as well as a beta blocker  #3 history of hypertension has somewhat variable systolics at this point will monitor I do not see consistent elevations she is on Cozaar as well as Lopressor.  #4-history of question respiratory issues asthma? She is on Advair this appears to be stable.  #5-history of anemia she is on iron Will update a CBC.  #6-history coronary artery disease at this point appear stable she is on aspirin was not on a statin secondary apparently to intolerance in the past.  #7-history of pelvic fractures again she has done quite well with this is ambulating with a walker at this point will monitor.  Number a history of GERD this appears stable on protonix  #9-  History of edema lower extremity she is on low-dose Lasix will update a metabolic panel.   [id number]

## 2014-09-19 NOTE — Telephone Encounter (Signed)
Opened in error

## 2014-09-20 ENCOUNTER — Ambulatory Visit (HOSPITAL_COMMUNITY)
Admission: RE | Admit: 2014-09-20 | Discharge: 2014-09-20 | Disposition: A | Discharge status: Home / Self Care | Payer: No Typology Code available for payment source | Source: Ambulatory Visit | Attending: Internal Medicine | Admitting: Internal Medicine

## 2014-09-20 ENCOUNTER — Encounter: Payer: Self-pay | Admitting: Internal Medicine

## 2014-09-20 ENCOUNTER — Other Ambulatory Visit: Payer: Self-pay | Admitting: Internal Medicine

## 2014-09-20 DIAGNOSIS — S42302D Unspecified fracture of shaft of humerus, left arm, subsequent encounter for fracture with routine healing: Secondary | ICD-10-CM | POA: Insufficient documentation

## 2014-09-20 DIAGNOSIS — Z09 Encounter for follow-up examination after completed treatment for conditions other than malignant neoplasm: Secondary | ICD-10-CM

## 2014-09-20 DIAGNOSIS — S42202D Unspecified fracture of upper end of left humerus, subsequent encounter for fracture with routine healing: Secondary | ICD-10-CM | POA: Diagnosis not present

## 2014-09-20 DIAGNOSIS — X58XXXD Exposure to other specified factors, subsequent encounter: Secondary | ICD-10-CM | POA: Insufficient documentation

## 2014-09-30 DIAGNOSIS — K219 Gastro-esophageal reflux disease without esophagitis: Secondary | ICD-10-CM | POA: Diagnosis not present

## 2014-09-30 DIAGNOSIS — S32810D Multiple fractures of pelvis with stable disruption of pelvic ring, subsequent encounter for fracture with routine healing: Secondary | ICD-10-CM | POA: Diagnosis not present

## 2014-09-30 DIAGNOSIS — R278 Other lack of coordination: Secondary | ICD-10-CM | POA: Diagnosis not present

## 2014-09-30 DIAGNOSIS — D509 Iron deficiency anemia, unspecified: Secondary | ICD-10-CM | POA: Diagnosis not present

## 2014-09-30 DIAGNOSIS — Z9181 History of falling: Secondary | ICD-10-CM | POA: Diagnosis not present

## 2014-09-30 DIAGNOSIS — I129 Hypertensive chronic kidney disease with stage 1 through stage 4 chronic kidney disease, or unspecified chronic kidney disease: Secondary | ICD-10-CM | POA: Diagnosis not present

## 2014-09-30 DIAGNOSIS — S42202D Unspecified fracture of upper end of left humerus, subsequent encounter for fracture with routine healing: Secondary | ICD-10-CM | POA: Diagnosis not present

## 2014-09-30 DIAGNOSIS — G4700 Insomnia, unspecified: Secondary | ICD-10-CM | POA: Diagnosis not present

## 2014-09-30 DIAGNOSIS — R488 Other symbolic dysfunctions: Secondary | ICD-10-CM | POA: Diagnosis not present

## 2014-09-30 DIAGNOSIS — I5032 Chronic diastolic (congestive) heart failure: Secondary | ICD-10-CM | POA: Diagnosis not present

## 2014-09-30 DIAGNOSIS — R262 Difficulty in walking, not elsewhere classified: Secondary | ICD-10-CM | POA: Diagnosis not present

## 2014-09-30 DIAGNOSIS — I10 Essential (primary) hypertension: Secondary | ICD-10-CM | POA: Diagnosis not present

## 2014-09-30 DIAGNOSIS — R1310 Dysphagia, unspecified: Secondary | ICD-10-CM | POA: Diagnosis not present

## 2014-09-30 DIAGNOSIS — S3282XD Multiple fractures of pelvis without disruption of pelvic ring, subsequent encounter for fracture with routine healing: Secondary | ICD-10-CM | POA: Diagnosis not present

## 2014-09-30 DIAGNOSIS — J4530 Mild persistent asthma, uncomplicated: Secondary | ICD-10-CM | POA: Diagnosis not present

## 2014-09-30 DIAGNOSIS — S52121D Displaced fracture of head of right radius, subsequent encounter for closed fracture with routine healing: Secondary | ICD-10-CM | POA: Diagnosis not present

## 2014-09-30 DIAGNOSIS — S52502D Unspecified fracture of the lower end of left radius, subsequent encounter for closed fracture with routine healing: Secondary | ICD-10-CM | POA: Diagnosis not present

## 2014-09-30 DIAGNOSIS — I2510 Atherosclerotic heart disease of native coronary artery without angina pectoris: Secondary | ICD-10-CM | POA: Diagnosis not present

## 2014-09-30 DIAGNOSIS — D62 Acute posthemorrhagic anemia: Secondary | ICD-10-CM | POA: Diagnosis not present

## 2014-09-30 DIAGNOSIS — M6281 Muscle weakness (generalized): Secondary | ICD-10-CM | POA: Diagnosis not present

## 2014-09-30 DIAGNOSIS — R945 Abnormal results of liver function studies: Secondary | ICD-10-CM | POA: Diagnosis not present

## 2014-09-30 DIAGNOSIS — Z87898 Personal history of other specified conditions: Secondary | ICD-10-CM | POA: Diagnosis not present

## 2014-09-30 DIAGNOSIS — N390 Urinary tract infection, site not specified: Secondary | ICD-10-CM | POA: Diagnosis not present

## 2014-10-01 ENCOUNTER — Non-Acute Institutional Stay (SKILLED_NURSING_FACILITY): Payer: Medicare Other | Admitting: Internal Medicine

## 2014-10-01 ENCOUNTER — Encounter: Payer: Self-pay | Admitting: Internal Medicine

## 2014-10-01 DIAGNOSIS — G4700 Insomnia, unspecified: Secondary | ICD-10-CM | POA: Diagnosis not present

## 2014-10-01 DIAGNOSIS — R945 Abnormal results of liver function studies: Secondary | ICD-10-CM

## 2014-10-01 DIAGNOSIS — S52121D Displaced fracture of head of right radius, subsequent encounter for closed fracture with routine healing: Secondary | ICD-10-CM | POA: Diagnosis not present

## 2014-10-01 NOTE — Progress Notes (Signed)
Patient ID: Amy [name], female   DOB: [birth year], 80 y.o.   MRN: [id number]   This is an acute visit.  Level care skilled.  Facility [facility].  Chief complaint acute visit follow-up liver function tests-insomnia.  History of present illness.  Patient is a pleasant 80 year old female with a history of pelvic fractures as well as a left humerus fracture-she is here essentially for rehabilitation.  It appears to be doing well in this regards.  She recently noted to have some edema of her left upper shoulder area and arm-this was not really painful and appear to be intermittent-x-ray did not really show anything acute. This show a healing humerus fracture-this appears to be gradually resolving although she still has some edema at times.  She also previously was noted to have an elevated alkaline phosphatase of 281--this has gradually gone down however on most recent lab is down to 128-previous lab she also had mildly elevated AST and ALT but on recent lab this is normalized.  She does not complain of any abdominal pain or discomfort.  She does have a history of a statin intolerance however.  Another issue today is complaints of insomnia-says she just feels tired recent days because she is not sleeping well at night-she does get a when necessary Ativan at night she says this helps for a couple hours and then she wakes up.  Family medical social history is been reviewed per admission note on 08/04/2014.  Medications have been reviewed per MAR.  Review of systems.  In general does not complain of any fever or chills.  Respiratory no complaints of increased shortness of breath she says she has somewhat chronic shortness of breath at times but nothing different than usual-does not complain of cough.  Cardiac no chest pain.  GI does not complain of any abdominal discomfort nausea or vomiting.  Neurologic is not complaining of dizziness or headache.  Muscle skeletal history of  pelvic fracture as well as left humerus fracture but this appears to be stable does not have any acute complaints of pain.  Neurologic does not complain of any dizziness or headache but does complain of some insomnia at night as noted above.  Physical exam.  Temperature is 97.5 pulse 76 respirations 20 blood pressure 122/74.  General is a pleasant elderly female in no distress sitting comfortably in her wheelchair.  Her skin is warm and dry.  Oropharynx clear mucous membranes moist.  Chest is clear to auscultation there is no labored breathing.  Heart is regular irregular rate and rhythm with a 2/6 systolic murmur she does not have significant lower extremity edema.  Abdomen is soft nontender positive bowel sounds.  Musculoskeletal-left upper arm continued to have some slight edema upper arm on the left-there is not really significant tenderness to palpation-is not warm or erythematous grip strength continues to be intact as well as radial pulses.  Strength and range of motion appears relatively intact status post humerus fracture.  Neurologic is grossly intact there is no lateralizing findings her speech continues to be clear.  Psych continues to be alert and oriented very pleasant and appropriate.  Labs. 09/29/2014.  Liver function tests within normal limits except alkaline phosphatase of 128 this is actually an improvement as noted in history of present illness-otherwise liver function tests within normal limits except albumin of 3.2.  09/22/2014.  WBC 4.7 hemoglobin 11.2 platelets 202.  Sodium 140 potassium 3.8 BUN 23 creatinine 1.02-at that time-phosphatase 137-AST 47-ALT 39  Assessment and  plan.  #1-hi-  story of mildly elevated liver function tests this appears to be normalizing-I suspect her fractures contributed to the alkaline phosphatase elevation which now appears almost normal.  #2-history of left humerus fracture-again x-ray did did show a healing fracture of  the proximal left humerus without dislocation  #3-insomnia-I did speak with her fairly extensively about this-at this point will discontinue the when necessary Ativan and start Ambien 5 mg a day daily at bedtime-when necessary-monitor for sedation and I did discuss this with her to let us know certainly if she feels weaker more sedated during the day status post Ambien.   CPT 99309-of note greater than 25 minutes spent assessing patient reviewing her chart-discussing her concerns at bedside and coordinating plan of care-of note greater than 50% of time spent coordinating plan of care     .

## 2014-10-02 ENCOUNTER — Encounter (INDEPENDENT_AMBULATORY_CARE_PROVIDER_SITE_OTHER): Payer: Self-pay | Admitting: *Deleted

## 2014-10-02 ENCOUNTER — Other Ambulatory Visit: Payer: Self-pay | Admitting: *Deleted

## 2014-10-02 MED ORDER — LORAZEPAM 1 MG PO TABS: ORAL_TABLET | ORAL | Status: DC

## 2014-10-02 NOTE — Telephone Encounter (Signed)
[employer]

## 2014-10-09 ENCOUNTER — Encounter (HOSPITAL_COMMUNITY): Payer: Self-pay | Admitting: Cardiovascular Disease

## 2014-10-09 ENCOUNTER — Non-Acute Institutional Stay (SKILLED_NURSING_FACILITY): Payer: Medicare Other | Admitting: Internal Medicine

## 2014-10-09 DIAGNOSIS — D62 Acute posthemorrhagic anemia: Secondary | ICD-10-CM

## 2014-10-09 DIAGNOSIS — I10 Essential (primary) hypertension: Secondary | ICD-10-CM

## 2014-10-09 DIAGNOSIS — S52502D Unspecified fracture of the lower end of left radius, subsequent encounter for closed fracture with routine healing: Secondary | ICD-10-CM | POA: Diagnosis not present

## 2014-10-09 DIAGNOSIS — S32810D Multiple fractures of pelvis with stable disruption of pelvic ring, subsequent encounter for fracture with routine healing: Secondary | ICD-10-CM | POA: Diagnosis not present

## 2014-10-09 DIAGNOSIS — K219 Gastro-esophageal reflux disease without esophagitis: Secondary | ICD-10-CM | POA: Diagnosis not present

## 2014-10-09 DIAGNOSIS — Z87898 Personal history of other specified conditions: Secondary | ICD-10-CM | POA: Diagnosis not present

## 2014-10-09 NOTE — Progress Notes (Signed)
Patient ID: Amy [name], female   DOB: 1921/11/10, 80 y.o.   MRN: [id number]   Patient ID: Amy [name], female   DOB: 19-Oct-1922, 80 y.o.   MRN: [id number]    Patient ID: Amy [name], female   DOB: 1921-11-19, 80 y.o.   MRN: [id number]   This is a routine visit.  Level care skilled.  Facility [facility].  Chief complaint--discharge note -.  History of present illness.  Patient is a very pleasant 80 year old female with a history of pelvic as well as a left humerus fracture--- she sustained a fall back in September and had a displaced proximal left humeral fracture-she also had nondisplaced fracture involving the pubic bones.  And left sacrum.  She will be going home with her daughter-will need continued PT and OT for further strengthening  Her stay here is been relatively unremarkable --she didn't complain recently of some edema around her left upper arm-x-ray was not concerning and this appears to wax and wane it is not painful or erythematous.  She also at times will complain of some dizziness initially at therapy when she is standing-I did take orthostatic blood pressure it was 110/66 standing it was 120/70 sitting-this will warrant follow up by her primary care provider-today she does not complain of this.  In regards to blood pressure   She is on numerous agents including Cozaar and Lopressor.  She also has a history of anemia she is on iron twice a day  At this point discomfort appears to be controlled by tramadol-.  She has complained of insomnia she does have an order for Ambien when necessary-she also has an order for Ativan once a day when necessary for anxiety.  Family medical social history as been reviewed per admission note on 08/04/2014.  Medications have been reviewed per MAR.  Review of systems.  In general no complaints of fever chills.  Head ears eyes nose mouth and throat does not complain of visual changes or sore throat.  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  Cardiac does have a history of coronary artery disease as well as atrial fibrillation this appears to be rate controlled.  GI does not complain of any nausea vomiting diarrhea constipation or abdominal discomfort.  GU does not complain of dysuria.  Muscle skeletal does have some history of pain in the proximal left arm and pelvic area-however this apparently is quite well controlled she takes Ultram as needed  Does not complain of pelvic pain is ambulating fairly well with a walker.  Neurologic does not complain of dizziness or headache-has complained occasionally of some dizziness initially when standing in therapy.  Psych does not complain of depression or anxiety.  Physical exam.   Temperature is 90.8 pulse 81 respirations 20 blood pressure taken manually today 120/70 sitting--110/66 standing  In general this is a very pleasant elderly female who looks younger than her stated age.  Her skin is warm and dry.  Eyes pupils appear reactive to light sclera and conjunctiva are clear visual acuity appears intact.  Oropharynx clear mucous membranes moist.  Chest is clear to auscultation there is no labored breathing.  Heart is regular rate and rhythm with a 2/6 systolic murmur she does not appear to have significant lower extremity edema.  Her abdomen soft nontender positive bowel sounds.  Muscle skeletal left upper arm appears to be possibly slight edema here there is  minimal tenderness to palpation it is not warm or erythematous grip strength appears to be intact there are positive pulses including the radial pulse grip strength again appears to be intact she is able to ambulate with a walker and is doing quite well with this with her history of pelvic fractures.  Neurologic is grossly intact no lateralizing findings her speech is clear   psych she is alert and  oriented pleasant and appropriate  Labs.  09/29/2014.  Liver function tests within normal limits except bilirubin 0.2-L2 phosphatase 128.  WBC 4.7 hemoglobin 11.2 platelets 202.  Sodium 140 potassium 3.8 BUN 23 creatinine 1.02  08/11/2014.  CBC 5.2 hemoglobin 9.9 platelets 236.  Sodium 138 potassium 4.4 BUN 29 creatinine 1.06.  r first 2015.  Alkaline phosphatase 281 albumin 2.2 otherwise liver function tests within normal limits.  Assessment and plan.  #1-history of left proximal humerus fracture-this is followed by orthopedics--she appears to be doing relatively well here .  She is receiving tramadol as well as muscle relaxer as needed for pain.  #2 history of atrial fibrillation-this appears rate controlled she is on aspirin as well as a beta blocker  #3 history of hypertension  she is on Cozaar as well as Lopressor with some question of orthostatic hypotension-will warant follow-up by primary care provider this apparently is an intermittent issue- Will order blood pressures lying and standing every morning-with a log for review next week-again her stay here will be quite short since she is being discharged-.  #4-history of question respiratory issues asthma? She is on Advair this appears to be stable.  #5-history of anemia she is on iron Will update a CBC.  #6-history coronary artery disease at this point appear stable she is on aspirin was not on a statin secondary apparently to intolerance in the past.  #7-history of pelvic fractures again she has done quite well with this is ambulating with a walker at this point will monitor.  Number  8-- a history of GERD this appears stable on protonix  #9-  History of edema lower extremity she is on low-dose Lasix will update a metabolic panel.   10-history of insomnia-she does have an order for Ambien when necessary-also for anxiety an order for Ativan 1 mg once daily as needed-I have advised her not to take Ambien and  Ambien together at night she expressed understanding  Patient will be going home with her daughter who is very supportive will need PT and OT for further strengthening   [id number]-of note greater than 30 minutes spent on this discharge summary

## 2014-10-12 ENCOUNTER — Encounter: Payer: Self-pay | Admitting: Internal Medicine

## 2014-10-13 ENCOUNTER — Ambulatory Visit (INDEPENDENT_AMBULATORY_CARE_PROVIDER_SITE_OTHER): Payer: Medicare Other | Admitting: Internal Medicine

## 2014-10-15 DIAGNOSIS — I129 Hypertensive chronic kidney disease with stage 1 through stage 4 chronic kidney disease, or unspecified chronic kidney disease: Secondary | ICD-10-CM | POA: Diagnosis not present

## 2014-10-15 DIAGNOSIS — S42292D Other displaced fracture of upper end of left humerus, subsequent encounter for fracture with routine healing: Secondary | ICD-10-CM | POA: Diagnosis not present

## 2014-10-15 DIAGNOSIS — M1990 Unspecified osteoarthritis, unspecified site: Secondary | ICD-10-CM | POA: Diagnosis not present

## 2014-10-15 DIAGNOSIS — S3282XD Multiple fractures of pelvis without disruption of pelvic ring, subsequent encounter for fracture with routine healing: Secondary | ICD-10-CM | POA: Diagnosis not present

## 2014-10-15 DIAGNOSIS — M6281 Muscle weakness (generalized): Secondary | ICD-10-CM | POA: Diagnosis not present

## 2014-10-15 DIAGNOSIS — S3219XD Other fracture of sacrum, subsequent encounter for fracture with routine healing: Secondary | ICD-10-CM | POA: Diagnosis not present

## 2014-10-16 DIAGNOSIS — I129 Hypertensive chronic kidney disease with stage 1 through stage 4 chronic kidney disease, or unspecified chronic kidney disease: Secondary | ICD-10-CM | POA: Diagnosis not present

## 2014-10-16 DIAGNOSIS — S3282XD Multiple fractures of pelvis without disruption of pelvic ring, subsequent encounter for fracture with routine healing: Secondary | ICD-10-CM | POA: Diagnosis not present

## 2014-10-16 DIAGNOSIS — M6281 Muscle weakness (generalized): Secondary | ICD-10-CM | POA: Diagnosis not present

## 2014-10-16 DIAGNOSIS — S42292D Other displaced fracture of upper end of left humerus, subsequent encounter for fracture with routine healing: Secondary | ICD-10-CM | POA: Diagnosis not present

## 2014-10-16 DIAGNOSIS — S3219XD Other fracture of sacrum, subsequent encounter for fracture with routine healing: Secondary | ICD-10-CM | POA: Diagnosis not present

## 2014-10-16 DIAGNOSIS — M1990 Unspecified osteoarthritis, unspecified site: Secondary | ICD-10-CM | POA: Diagnosis not present

## 2014-10-17 DIAGNOSIS — S3282XD Multiple fractures of pelvis without disruption of pelvic ring, subsequent encounter for fracture with routine healing: Secondary | ICD-10-CM | POA: Diagnosis not present

## 2014-10-17 DIAGNOSIS — I129 Hypertensive chronic kidney disease with stage 1 through stage 4 chronic kidney disease, or unspecified chronic kidney disease: Secondary | ICD-10-CM | POA: Diagnosis not present

## 2014-10-17 DIAGNOSIS — S3219XD Other fracture of sacrum, subsequent encounter for fracture with routine healing: Secondary | ICD-10-CM | POA: Diagnosis not present

## 2014-10-17 DIAGNOSIS — S42292D Other displaced fracture of upper end of left humerus, subsequent encounter for fracture with routine healing: Secondary | ICD-10-CM | POA: Diagnosis not present

## 2014-10-17 DIAGNOSIS — M6281 Muscle weakness (generalized): Secondary | ICD-10-CM | POA: Diagnosis not present

## 2014-10-17 DIAGNOSIS — M1990 Unspecified osteoarthritis, unspecified site: Secondary | ICD-10-CM | POA: Diagnosis not present

## 2014-10-19 DIAGNOSIS — I129 Hypertensive chronic kidney disease with stage 1 through stage 4 chronic kidney disease, or unspecified chronic kidney disease: Secondary | ICD-10-CM | POA: Diagnosis not present

## 2014-10-19 DIAGNOSIS — M6281 Muscle weakness (generalized): Secondary | ICD-10-CM | POA: Diagnosis not present

## 2014-10-19 DIAGNOSIS — S3219XD Other fracture of sacrum, subsequent encounter for fracture with routine healing: Secondary | ICD-10-CM | POA: Diagnosis not present

## 2014-10-19 DIAGNOSIS — S42292D Other displaced fracture of upper end of left humerus, subsequent encounter for fracture with routine healing: Secondary | ICD-10-CM | POA: Diagnosis not present

## 2014-10-19 DIAGNOSIS — S3282XD Multiple fractures of pelvis without disruption of pelvic ring, subsequent encounter for fracture with routine healing: Secondary | ICD-10-CM | POA: Diagnosis not present

## 2014-10-19 DIAGNOSIS — M1990 Unspecified osteoarthritis, unspecified site: Secondary | ICD-10-CM | POA: Diagnosis not present

## 2014-10-20 ENCOUNTER — Telehealth: Payer: Self-pay | Admitting: Cardiovascular Disease

## 2014-10-20 NOTE — Telephone Encounter (Signed)
Please call,question about her medicine. °

## 2014-10-20 NOTE — Telephone Encounter (Signed)
Spoke with Juliann Pulse. Patient was just discharged from [facility]. She wanted to confirm that her mother is supposed to take losartan 50mg  QD (Yes) and that she is NOT to take amlodipine (No - d/c'ed by Dr. Loletha Grayer 05/27/14). Daughter voiced understanding of this.  She also states that her mother (patient) got a letter that states her metoprolol would be more expensive in 2016 and wanted to know about alteratives. Advised them to send copy of letter to Dr. Jobe Marker for review and more info

## 2014-10-21 DIAGNOSIS — S3282XD Multiple fractures of pelvis without disruption of pelvic ring, subsequent encounter for fracture with routine healing: Secondary | ICD-10-CM | POA: Diagnosis not present

## 2014-10-21 DIAGNOSIS — M1990 Unspecified osteoarthritis, unspecified site: Secondary | ICD-10-CM | POA: Diagnosis not present

## 2014-10-21 DIAGNOSIS — I129 Hypertensive chronic kidney disease with stage 1 through stage 4 chronic kidney disease, or unspecified chronic kidney disease: Secondary | ICD-10-CM | POA: Diagnosis not present

## 2014-10-21 DIAGNOSIS — S3219XD Other fracture of sacrum, subsequent encounter for fracture with routine healing: Secondary | ICD-10-CM | POA: Diagnosis not present

## 2014-10-21 DIAGNOSIS — M6281 Muscle weakness (generalized): Secondary | ICD-10-CM | POA: Diagnosis not present

## 2014-10-21 DIAGNOSIS — S42292D Other displaced fracture of upper end of left humerus, subsequent encounter for fracture with routine healing: Secondary | ICD-10-CM | POA: Diagnosis not present

## 2014-10-28 ENCOUNTER — Ambulatory Visit (INDEPENDENT_AMBULATORY_CARE_PROVIDER_SITE_OTHER): Payer: Medicare Other | Admitting: Internal Medicine

## 2014-10-28 ENCOUNTER — Encounter (INDEPENDENT_AMBULATORY_CARE_PROVIDER_SITE_OTHER): Payer: Self-pay | Admitting: Internal Medicine

## 2014-10-28 VITALS — BP 128/60 | HR 72 | Temp 97.7°F | Ht 65.5 in | Wt 159.2 lb

## 2014-10-28 DIAGNOSIS — M1990 Unspecified osteoarthritis, unspecified site: Secondary | ICD-10-CM | POA: Diagnosis not present

## 2014-10-28 DIAGNOSIS — K219 Gastro-esophageal reflux disease without esophagitis: Secondary | ICD-10-CM

## 2014-10-28 DIAGNOSIS — S3282XD Multiple fractures of pelvis without disruption of pelvic ring, subsequent encounter for fracture with routine healing: Secondary | ICD-10-CM | POA: Diagnosis not present

## 2014-10-28 DIAGNOSIS — I2510 Atherosclerotic heart disease of native coronary artery without angina pectoris: Secondary | ICD-10-CM

## 2014-10-28 DIAGNOSIS — M6281 Muscle weakness (generalized): Secondary | ICD-10-CM | POA: Diagnosis not present

## 2014-10-28 DIAGNOSIS — S42292D Other displaced fracture of upper end of left humerus, subsequent encounter for fracture with routine healing: Secondary | ICD-10-CM | POA: Diagnosis not present

## 2014-10-28 DIAGNOSIS — S3219XD Other fracture of sacrum, subsequent encounter for fracture with routine healing: Secondary | ICD-10-CM | POA: Diagnosis not present

## 2014-10-28 DIAGNOSIS — I129 Hypertensive chronic kidney disease with stage 1 through stage 4 chronic kidney disease, or unspecified chronic kidney disease: Secondary | ICD-10-CM | POA: Diagnosis not present

## 2014-10-28 NOTE — Progress Notes (Signed)
Subjective:    Patient ID: Amy [name], female    DOB: [birth year], 80 y.o.   MRN: [id number]  HPI Here today for f/u of her GERD. After she fell, she had burping which for the most part has now resolved. She says she has very little acid reflux. Appetite is not good. She has not lost any weight. Her weight has remained the same.  When she is SOB, she will have burping. She has a BM regular. She usually has a BM x 2 a day.  She does take Miralax prn for constipation.  She says the iron causes constipation.  She is now at home and walks with a walker. She does have help during the day and her daughter stays with her at night.  CBC    Component Value Date/Time   WBC 10.1 07/31/2014 1444   RBC 3.76* 07/31/2014 1444   HGB 11.3* 07/31/2014 1444   HCT 35.0* 07/31/2014 1444   PLT 301 07/31/2014 1444   MCV 93.1 07/31/2014 1444   MCH 30.1 07/31/2014 1444   MCHC 32.3 07/31/2014 1444   RDW 13.5 07/31/2014 1444   LYMPHSABS 1.0 07/31/2014 1444   MONOABS 0.6 07/31/2014 1444   EOSABS 0.2 07/31/2014 1444   BASOSABS 0.0 07/31/2014 1444      Fell July 21, 2014 and broke her pelvis and left shoulder and spent time [place] x [facility] days for Rehab and then went to [facility] for about 2 months.    Hx of esophageal rupture. Felt the small esophageal perforation was related to traumatic injury that was related to intubation at time of her initial orthopedic surgery in October 9 , 2013. Closed fracture of left distal radius s/p ORIF by Dr Burney Gauze 08/08/2012  Odynophagia due to new traumatic peri-op esophageal perforation  Mediastinitis due to esophageal perforation-s/p Incision and drainage, right neck and mediastinum, transcervical approach.  She ended up with a stomach tube for about 60 days. 1. Esophageal perforation. The perforation appears to be at the  level of the cricoid cartilage  2. Extensive retropharyngeal gas with perforation posterior to the  carotid space on the  right and surrounding the thyroid gland and  larynx.  3. Medial deviation of the right internal carotid artery within  the retropharyngeal space is surrounded by gas.  4. Atherosclerosis.  5. Moderate spondylosis of the cervical spine.  08/20/2012 DG Esophogram:  IMPRESSION:  1. Study is positive for persistent esophageal perforation, as  above.  Review of Systems Past Medical History  Diagnosis Date  . Hypertension   . Hypercholesteremia   . Myocardial infarction     Inferior STEMI 07/2008 - PCI of prox RCA followed byu CABG x 3 in 9/'09  . Pneumonia   . Anxiety   . CAD in native artery     s/p CABG x 3; Last Myoview in 07/2009 - non-ischemic; Echo 03/2012  Aortic Sclerosis with normal EF.  Marland Kitchen Asthma   . Pacemaker   . GERD (gastroesophageal reflux disease)   . Arthritis   . Status post hip hemiarthroplasty left hip by Dr Ronnie Derby 08/08/2012  . PAF (paroxysmal atrial fibrillation)   . CKD (chronic kidney disease), stage III   . DVT (deep vein thrombosis) in pregnancy   . Esophageal perforation     9/13  . Mediastinitis     s/p drainage  . Hypothyroidism   . Shortness of breath   . Fall 07/21/2014    FALL WITH INJURY  .  Fracture of head of humerus   . Fracture of hip 07/21/2014    LEFT    Past Surgical History  Procedure Laterality Date  . Coronary artery bypass graft  2009    LIMA-LAD, SVG-RI, SVG-stented RCA  . Total hip arthroplasty    . Pacemaker insertion  01/06/2009    Medtronic  . Wrist fracture surgery  07/2012    left intra articular  with carpel tunnel  . Carpal tunnel release  08/08/2012    Procedure: CARPAL TUNNEL RELEASE;  Surgeon: Schuyler Amor, MD;  Location: [place];  Service: Orthopedics;  Laterality: Left;  . Hip arthroplasty  08/08/2012    Procedure: ARTHROPLASTY BIPOLAR HIP;  Surgeon: Rudean Haskell, MD;  Location: [place];  Service: Orthopedics;  Laterality: Left;  [place]   . Esophagogastroduodenoscopy  08/10/2012    Procedure:  ESOPHAGOGASTRODUODENOSCOPY (EGD);  Surgeon: Beryle Beams, MD;  Location: [facility] ENDOSCOPY;  Service: Endoscopy;  Laterality: N/A;  . Esophagoscopy  08/10/2012    Procedure: ESOPHAGOSCOPY;  Surgeon: [name], MD;  Location: [place];  Service: ENT;  Laterality: N/A;  . Gastrostomy  08/16/2012    Procedure: GASTROSTOMY;  Surgeon: Zenovia Jarred, MD;  Location: [place];  Service: General;  Laterality: N/A;  . Joint replacement    . Nm myocar perf wall motion  08/14/2009    Normal  . Left heart catheterization with coronary angiogram N/A 04/16/2014    Procedure: LEFT HEART CATHETERIZATION WITH CORONARY ANGIOGRAM;  Surgeon: Wellington Hampshire, MD;  Location: [place] CATH LAB;  Service: Cardiovascular;  Laterality: N/A;    Allergies  Allergen Reactions  . Colestipol     Muscle aches  . Hydrocodone-Acetaminophen Itching    Tolerates tylenol  . Niacin And Related   . Statins     Muscle aches on all she has tried.  She recalls Lipitor, Crestor, and Livalo but thinks there were others  . Zetia [Ezetimibe]     Muscle aches    Current Outpatient Prescriptions on File Prior to Visit  Medication Sig Dispense Refill  . acetaminophen (TYLENOL) 500 MG tablet Take 2 tablets (1,000 mg total) by mouth every 8 (eight) hours. 30 tablet 0  . albuterol (PROVENTIL HFA;VENTOLIN HFA) 108 (90 BASE) MCG/ACT inhaler Inhale 2 puffs into the lungs every 4 (four) hours as needed for wheezing or shortness of breath. 1 Inhaler 1  . aspirin EC 81 MG tablet Take 81 mg by mouth daily.    . Ferrous Fumarate 150 MG TABS Take 1 tablet (150 mg total) by mouth 2 (two) times daily. (Patient taking differently: Take 1 tablet by mouth daily. )    . Fluticasone-Salmeterol (ADVAIR) 100-50 MCG/DOSE AEPB Inhale 1 puff into the lungs 2 (two) times daily.     Marland Kitchen levothyroxine (SYNTHROID, LEVOTHROID) 75 MCG tablet Take 75 mcg by mouth daily before breakfast.    . LORazepam (ATIVAN) 1 MG tablet Take one tablet by mouth every morning as needed  for anxiety 30 tablet 5  . metoprolol succinate (TOPROL-XL) 25 MG 24 hr tablet Take 0.5 tablets (12.5 mg total) by mouth daily.    . nitroGLYCERIN (NITROSTAT) 0.4 MG SL tablet Place 1 tablet (0.4 mg total) under the tongue every 5 (five) minutes x 3 doses as needed for chest pain. 25 tablet 12  . ondansetron (ZOFRAN) 4 MG tablet Take 4 mg by mouth every 6 (six) hours as needed for nausea or vomiting.    . pantoprazole (PROTONIX) 40 MG tablet Take  40 mg by mouth daily.    . polyethylene glycol (MIRALAX / GLYCOLAX) packet Take 17 g by mouth daily. Hold for diarrhea 14 each 0  . traMADol (ULTRAM) 50 MG tablet Take one tablet by mouth every 6 hours as needed for moderate pain 120 tablet 5   No current facility-administered medications on file prior to visit.    Past Medical History  Diagnosis Date  . Hypertension   . Hypercholesteremia   . Myocardial infarction     Inferior STEMI 07/2008 - PCI of prox RCA followed byu CABG x 3 in 9/'09  . Pneumonia   . Anxiety   . CAD in native artery     s/p CABG x 3; Last Myoview in 07/2009 - non-ischemic; Echo 03/2012  Aortic Sclerosis with normal EF.  Marland Kitchen Asthma   . Pacemaker   . GERD (gastroesophageal reflux disease)   . Arthritis   . Status post hip hemiarthroplasty left hip by Dr Ronnie Derby 08/08/2012  . PAF (paroxysmal atrial fibrillation)   . CKD (chronic kidney disease), stage III   . DVT (deep vein thrombosis) in pregnancy   . Esophageal perforation     9/13  . Mediastinitis     s/p drainage  . Hypothyroidism   . Shortness of breath   . Fall 07/21/2014    FALL WITH INJURY  . Fracture of head of humerus   . Fracture of hip 07/21/2014    LEFT    Past Surgical History  Procedure Laterality Date  . Coronary artery bypass graft  2009    LIMA-LAD, SVG-RI, SVG-stented RCA  . Total hip arthroplasty    . Pacemaker insertion  01/06/2009    Medtronic  . Wrist fracture surgery  07/2012    left intra articular  with carpel tunnel  . Carpal tunnel  release  08/08/2012    Procedure: CARPAL TUNNEL RELEASE;  Surgeon: Schuyler Amor, MD;  Location: [place];  Service: Orthopedics;  Laterality: Left;  . Hip arthroplasty  08/08/2012    Procedure: ARTHROPLASTY BIPOLAR HIP;  Surgeon: Rudean Haskell, MD;  Location: [place];  Service: Orthopedics;  Laterality: Left;  [place]   . Esophagogastroduodenoscopy  08/10/2012    Procedure: ESOPHAGOGASTRODUODENOSCOPY (EGD);  Surgeon: Beryle Beams, MD;  Location: [facility] ENDOSCOPY;  Service: Endoscopy;  Laterality: N/A;  . Esophagoscopy  08/10/2012    Procedure: ESOPHAGOSCOPY;  Surgeon: [name], MD;  Location: [place];  Service: ENT;  Laterality: N/A;  . Gastrostomy  08/16/2012    Procedure: GASTROSTOMY;  Surgeon: Zenovia Jarred, MD;  Location: [place];  Service: General;  Laterality: N/A;  . Joint replacement    . Nm myocar perf wall motion  08/14/2009    Normal  . Left heart catheterization with coronary angiogram N/A 04/16/2014    Procedure: LEFT HEART CATHETERIZATION WITH CORONARY ANGIOGRAM;  Surgeon: Wellington Hampshire, MD;  Location: [place] CATH LAB;  Service: Cardiovascular;  Laterality: N/A;    Allergies  Allergen Reactions  . Colestipol     Muscle aches  . Hydrocodone-Acetaminophen Itching    Tolerates tylenol  . Niacin And Related   . Statins     Muscle aches on all she has tried.  She recalls Lipitor, Crestor, and Livalo but thinks there were others  . Zetia [Ezetimibe]     Muscle aches    Current Outpatient Prescriptions on File Prior to Visit  Medication Sig Dispense Refill  . acetaminophen (TYLENOL) 500 MG tablet Take 2 tablets (1,000 mg  total) by mouth every 8 (eight) hours. 30 tablet 0  . albuterol (PROVENTIL HFA;VENTOLIN HFA) 108 (90 BASE) MCG/ACT inhaler Inhale 2 puffs into the lungs every 4 (four) hours as needed for wheezing or shortness of breath. 1 Inhaler 1  . aspirin EC 81 MG tablet Take 81 mg by mouth daily.    . Ferrous Fumarate 150 MG TABS Take 1 tablet (150 mg total) by mouth  2 (two) times daily. (Patient taking differently: Take 1 tablet by mouth daily. )    . Fluticasone-Salmeterol (ADVAIR) 100-50 MCG/DOSE AEPB Inhale 1 puff into the lungs 2 (two) times daily.     Marland Kitchen levothyroxine (SYNTHROID, LEVOTHROID) 75 MCG tablet Take 75 mcg by mouth daily before breakfast.    . LORazepam (ATIVAN) 1 MG tablet Take one tablet by mouth every morning as needed for anxiety 30 tablet 5  . metoprolol succinate (TOPROL-XL) 25 MG 24 hr tablet Take 0.5 tablets (12.5 mg total) by mouth daily.    . nitroGLYCERIN (NITROSTAT) 0.4 MG SL tablet Place 1 tablet (0.4 mg total) under the tongue every 5 (five) minutes x 3 doses as needed for chest pain. 25 tablet 12  . ondansetron (ZOFRAN) 4 MG tablet Take 4 mg by mouth every 6 (six) hours as needed for nausea or vomiting.    . pantoprazole (PROTONIX) 40 MG tablet Take 40 mg by mouth daily.    . polyethylene glycol (MIRALAX / GLYCOLAX) packet Take 17 g by mouth daily. Hold for diarrhea 14 each 0  . traMADol (ULTRAM) 50 MG tablet Take one tablet by mouth every 6 hours as needed for moderate pain 120 tablet 5   No current facility-administered medications on file prior to visit.        Objective:   Physical Exam  Filed Vitals:   10/28/14 1526  Height: 5' 5.5" (1.664 m)  Weight: 159 lb 3.2 oz (72.213 kg)   Alert and oriented. Skin warm and dry. Oral mucosa is moist.   . Sclera anicteric, conjunctivae is pink. Thyroid not enlarged. No cervical lymphadenopathy. Lungs clear. Heart regular rate and rhythm.  Abdomen is soft. Bowel sounds are positive. No hepatomegaly. No abdominal masses felt. No tenderness.  No edema to lower extremities..        Assessment & Plan:  GERD controlled at this time. Patient seems to be doing well. Has strong family support at home.  OV in 1 year.

## 2014-10-28 NOTE — Patient Instructions (Signed)
OV in 1 yr. 

## 2014-10-29 DIAGNOSIS — S42292D Other displaced fracture of upper end of left humerus, subsequent encounter for fracture with routine healing: Secondary | ICD-10-CM | POA: Diagnosis not present

## 2014-10-29 DIAGNOSIS — M6281 Muscle weakness (generalized): Secondary | ICD-10-CM | POA: Diagnosis not present

## 2014-10-29 DIAGNOSIS — S3282XD Multiple fractures of pelvis without disruption of pelvic ring, subsequent encounter for fracture with routine healing: Secondary | ICD-10-CM | POA: Diagnosis not present

## 2014-10-29 DIAGNOSIS — S3219XD Other fracture of sacrum, subsequent encounter for fracture with routine healing: Secondary | ICD-10-CM | POA: Diagnosis not present

## 2014-10-29 DIAGNOSIS — M1990 Unspecified osteoarthritis, unspecified site: Secondary | ICD-10-CM | POA: Diagnosis not present

## 2014-10-29 DIAGNOSIS — I129 Hypertensive chronic kidney disease with stage 1 through stage 4 chronic kidney disease, or unspecified chronic kidney disease: Secondary | ICD-10-CM | POA: Diagnosis not present

## 2014-10-30 DIAGNOSIS — M1990 Unspecified osteoarthritis, unspecified site: Secondary | ICD-10-CM | POA: Diagnosis not present

## 2014-10-30 DIAGNOSIS — S42292D Other displaced fracture of upper end of left humerus, subsequent encounter for fracture with routine healing: Secondary | ICD-10-CM | POA: Diagnosis not present

## 2014-10-30 DIAGNOSIS — S3219XD Other fracture of sacrum, subsequent encounter for fracture with routine healing: Secondary | ICD-10-CM | POA: Diagnosis not present

## 2014-10-30 DIAGNOSIS — S3282XD Multiple fractures of pelvis without disruption of pelvic ring, subsequent encounter for fracture with routine healing: Secondary | ICD-10-CM | POA: Diagnosis not present

## 2014-10-30 DIAGNOSIS — I129 Hypertensive chronic kidney disease with stage 1 through stage 4 chronic kidney disease, or unspecified chronic kidney disease: Secondary | ICD-10-CM | POA: Diagnosis not present

## 2014-10-30 DIAGNOSIS — M6281 Muscle weakness (generalized): Secondary | ICD-10-CM | POA: Diagnosis not present

## 2014-10-31 DIAGNOSIS — S3219XD Other fracture of sacrum, subsequent encounter for fracture with routine healing: Secondary | ICD-10-CM | POA: Diagnosis not present

## 2014-10-31 DIAGNOSIS — S42292D Other displaced fracture of upper end of left humerus, subsequent encounter for fracture with routine healing: Secondary | ICD-10-CM | POA: Diagnosis not present

## 2014-10-31 DIAGNOSIS — S3282XD Multiple fractures of pelvis without disruption of pelvic ring, subsequent encounter for fracture with routine healing: Secondary | ICD-10-CM | POA: Diagnosis not present

## 2014-10-31 DIAGNOSIS — M6281 Muscle weakness (generalized): Secondary | ICD-10-CM | POA: Diagnosis not present

## 2014-10-31 DIAGNOSIS — I129 Hypertensive chronic kidney disease with stage 1 through stage 4 chronic kidney disease, or unspecified chronic kidney disease: Secondary | ICD-10-CM | POA: Diagnosis not present

## 2014-10-31 DIAGNOSIS — M1990 Unspecified osteoarthritis, unspecified site: Secondary | ICD-10-CM | POA: Diagnosis not present

## 2014-11-03 DIAGNOSIS — I129 Hypertensive chronic kidney disease with stage 1 through stage 4 chronic kidney disease, or unspecified chronic kidney disease: Secondary | ICD-10-CM | POA: Diagnosis not present

## 2014-11-03 DIAGNOSIS — S42292D Other displaced fracture of upper end of left humerus, subsequent encounter for fracture with routine healing: Secondary | ICD-10-CM | POA: Diagnosis not present

## 2014-11-03 DIAGNOSIS — S3282XD Multiple fractures of pelvis without disruption of pelvic ring, subsequent encounter for fracture with routine healing: Secondary | ICD-10-CM | POA: Diagnosis not present

## 2014-11-03 DIAGNOSIS — M6281 Muscle weakness (generalized): Secondary | ICD-10-CM | POA: Diagnosis not present

## 2014-11-03 DIAGNOSIS — M1990 Unspecified osteoarthritis, unspecified site: Secondary | ICD-10-CM | POA: Diagnosis not present

## 2014-11-03 DIAGNOSIS — S3219XD Other fracture of sacrum, subsequent encounter for fracture with routine healing: Secondary | ICD-10-CM | POA: Diagnosis not present

## 2014-11-04 DIAGNOSIS — I129 Hypertensive chronic kidney disease with stage 1 through stage 4 chronic kidney disease, or unspecified chronic kidney disease: Secondary | ICD-10-CM | POA: Diagnosis not present

## 2014-11-04 DIAGNOSIS — S3219XD Other fracture of sacrum, subsequent encounter for fracture with routine healing: Secondary | ICD-10-CM | POA: Diagnosis not present

## 2014-11-04 DIAGNOSIS — S42292D Other displaced fracture of upper end of left humerus, subsequent encounter for fracture with routine healing: Secondary | ICD-10-CM | POA: Diagnosis not present

## 2014-11-04 DIAGNOSIS — M1990 Unspecified osteoarthritis, unspecified site: Secondary | ICD-10-CM | POA: Diagnosis not present

## 2014-11-04 DIAGNOSIS — M6281 Muscle weakness (generalized): Secondary | ICD-10-CM | POA: Diagnosis not present

## 2014-11-04 DIAGNOSIS — S3282XD Multiple fractures of pelvis without disruption of pelvic ring, subsequent encounter for fracture with routine healing: Secondary | ICD-10-CM | POA: Diagnosis not present

## 2014-11-05 DIAGNOSIS — Z85828 Personal history of other malignant neoplasm of skin: Secondary | ICD-10-CM | POA: Diagnosis not present

## 2014-11-05 DIAGNOSIS — L570 Actinic keratosis: Secondary | ICD-10-CM | POA: Diagnosis not present

## 2014-11-06 DIAGNOSIS — I129 Hypertensive chronic kidney disease with stage 1 through stage 4 chronic kidney disease, or unspecified chronic kidney disease: Secondary | ICD-10-CM | POA: Diagnosis not present

## 2014-11-06 DIAGNOSIS — S42292D Other displaced fracture of upper end of left humerus, subsequent encounter for fracture with routine healing: Secondary | ICD-10-CM | POA: Diagnosis not present

## 2014-11-06 DIAGNOSIS — S3219XD Other fracture of sacrum, subsequent encounter for fracture with routine healing: Secondary | ICD-10-CM | POA: Diagnosis not present

## 2014-11-06 DIAGNOSIS — M6281 Muscle weakness (generalized): Secondary | ICD-10-CM | POA: Diagnosis not present

## 2014-11-06 DIAGNOSIS — M1990 Unspecified osteoarthritis, unspecified site: Secondary | ICD-10-CM | POA: Diagnosis not present

## 2014-11-06 DIAGNOSIS — S3282XD Multiple fractures of pelvis without disruption of pelvic ring, subsequent encounter for fracture with routine healing: Secondary | ICD-10-CM | POA: Diagnosis not present

## 2014-11-07 DIAGNOSIS — S3282XD Multiple fractures of pelvis without disruption of pelvic ring, subsequent encounter for fracture with routine healing: Secondary | ICD-10-CM | POA: Diagnosis not present

## 2014-11-07 DIAGNOSIS — I129 Hypertensive chronic kidney disease with stage 1 through stage 4 chronic kidney disease, or unspecified chronic kidney disease: Secondary | ICD-10-CM | POA: Diagnosis not present

## 2014-11-07 DIAGNOSIS — M1990 Unspecified osteoarthritis, unspecified site: Secondary | ICD-10-CM | POA: Diagnosis not present

## 2014-11-07 DIAGNOSIS — M6281 Muscle weakness (generalized): Secondary | ICD-10-CM | POA: Diagnosis not present

## 2014-11-07 DIAGNOSIS — S42292D Other displaced fracture of upper end of left humerus, subsequent encounter for fracture with routine healing: Secondary | ICD-10-CM | POA: Diagnosis not present

## 2014-11-07 DIAGNOSIS — S3219XD Other fracture of sacrum, subsequent encounter for fracture with routine healing: Secondary | ICD-10-CM | POA: Diagnosis not present

## 2014-11-13 DIAGNOSIS — M1990 Unspecified osteoarthritis, unspecified site: Secondary | ICD-10-CM | POA: Diagnosis not present

## 2014-11-13 DIAGNOSIS — I129 Hypertensive chronic kidney disease with stage 1 through stage 4 chronic kidney disease, or unspecified chronic kidney disease: Secondary | ICD-10-CM | POA: Diagnosis not present

## 2014-11-13 DIAGNOSIS — S42292D Other displaced fracture of upper end of left humerus, subsequent encounter for fracture with routine healing: Secondary | ICD-10-CM | POA: Diagnosis not present

## 2014-11-13 DIAGNOSIS — S3219XD Other fracture of sacrum, subsequent encounter for fracture with routine healing: Secondary | ICD-10-CM | POA: Diagnosis not present

## 2014-11-13 DIAGNOSIS — S3282XD Multiple fractures of pelvis without disruption of pelvic ring, subsequent encounter for fracture with routine healing: Secondary | ICD-10-CM | POA: Diagnosis not present

## 2014-11-13 DIAGNOSIS — M6281 Muscle weakness (generalized): Secondary | ICD-10-CM | POA: Diagnosis not present

## 2014-11-18 DIAGNOSIS — I129 Hypertensive chronic kidney disease with stage 1 through stage 4 chronic kidney disease, or unspecified chronic kidney disease: Secondary | ICD-10-CM | POA: Diagnosis not present

## 2014-11-18 DIAGNOSIS — M1990 Unspecified osteoarthritis, unspecified site: Secondary | ICD-10-CM | POA: Diagnosis not present

## 2014-11-18 DIAGNOSIS — S42292D Other displaced fracture of upper end of left humerus, subsequent encounter for fracture with routine healing: Secondary | ICD-10-CM | POA: Diagnosis not present

## 2014-11-18 DIAGNOSIS — S3282XD Multiple fractures of pelvis without disruption of pelvic ring, subsequent encounter for fracture with routine healing: Secondary | ICD-10-CM | POA: Diagnosis not present

## 2014-11-18 DIAGNOSIS — M6281 Muscle weakness (generalized): Secondary | ICD-10-CM | POA: Diagnosis not present

## 2014-11-18 DIAGNOSIS — S3219XD Other fracture of sacrum, subsequent encounter for fracture with routine healing: Secondary | ICD-10-CM | POA: Diagnosis not present

## 2014-11-25 ENCOUNTER — Ambulatory Visit (INDEPENDENT_AMBULATORY_CARE_PROVIDER_SITE_OTHER): Payer: Medicare Other | Admitting: Cardiovascular Disease

## 2014-11-25 ENCOUNTER — Encounter: Payer: Self-pay | Admitting: Cardiovascular Disease

## 2014-11-25 VITALS — BP 171/84 | HR 76 | Resp 16 | Ht 65.0 in | Wt 158.6 lb

## 2014-11-25 DIAGNOSIS — I495 Sick sinus syndrome: Secondary | ICD-10-CM | POA: Diagnosis not present

## 2014-11-25 DIAGNOSIS — I480 Paroxysmal atrial fibrillation: Secondary | ICD-10-CM | POA: Diagnosis not present

## 2014-11-25 DIAGNOSIS — I2510 Atherosclerotic heart disease of native coronary artery without angina pectoris: Secondary | ICD-10-CM

## 2014-11-25 DIAGNOSIS — Z950 Presence of cardiac pacemaker: Secondary | ICD-10-CM | POA: Diagnosis not present

## 2014-11-25 DIAGNOSIS — I4892 Unspecified atrial flutter: Secondary | ICD-10-CM | POA: Diagnosis not present

## 2014-11-25 LAB — MDC_IDC_ENUM_SESS_TYPE_INCLINIC
Battery Voltage: 2.97 V
Brady Statistic AP VP Percent: 0.4 %
Brady Statistic AS VP Percent: 0.2 %
Brady Statistic AS VS Percent: 43.1 %
Lead Channel Impedance Value: 728 Ohm
Lead Channel Pacing Threshold Amplitude: 1 V
Lead Channel Pacing Threshold Amplitude: 1.5 V
Lead Channel Setting Pacing Amplitude: 2 V
Lead Channel Setting Pacing Amplitude: 2.5 V
Lead Channel Setting Pacing Pulse Width: 0.8 ms
Lead Channel Setting Sensing Sensitivity: 0.9 mV
MDC IDC MSMT LEADCHNL RA IMPEDANCE VALUE: 528 Ohm
MDC IDC MSMT LEADCHNL RA PACING THRESHOLD PULSEWIDTH: 0.4 ms
MDC IDC MSMT LEADCHNL RA SENSING INTR AMPL: 2.8 mV
MDC IDC MSMT LEADCHNL RV PACING THRESHOLD PULSEWIDTH: 0.8 ms
MDC IDC MSMT LEADCHNL RV SENSING INTR AMPL: 17.2 mV
MDC IDC SET ZONE DETECTION INTERVAL: 400 ms
MDC IDC STAT BRADY AP VS PERCENT: 56.3 %
Zone Setting Detection Interval: 450 ms

## 2014-11-25 NOTE — Patient Instructions (Signed)
Remote monitoring is used to monitor your pacemaker  from home. This monitoring reduces the number of office visits required to check your device to one time per year. It allows [place] to keep an eye on the functioning of your device to ensure it is working properly. You are scheduled for a device check from home on 02-24-2015. You may send your transmission at any time that day. If you have a wireless device, the transmission will be sent automatically. After your physician reviews your transmission, you will receive a postcard with your next transmission date.  Your physician recommends that you schedule a follow-up appointment in: 6 months with Dr.Croitoru + pacemaker check

## 2014-11-26 ENCOUNTER — Encounter: Payer: Self-pay | Admitting: Cardiovascular Disease

## 2014-11-26 DIAGNOSIS — I4892 Unspecified atrial flutter: Secondary | ICD-10-CM | POA: Insufficient documentation

## 2014-11-26 DIAGNOSIS — I495 Sick sinus syndrome: Secondary | ICD-10-CM

## 2014-11-26 HISTORY — DX: Sick sinus syndrome: I49.5

## 2014-11-26 HISTORY — DX: Unspecified atrial flutter: I48.92

## 2014-11-26 NOTE — Progress Notes (Signed)
Patient ID: Amy [name], female   DOB: [birth year], 81 y.o.   MRN: [id number]     Reason for office visit Pacemaker follow-up, SSS, atrial flutter & atrial fibrillation, CAD p CABG  Amy [name] is a 81 year old woman with CAD, PAF and SSS s/p pacemaker.  In 2009 she had an acute inferior wall ST segment elevation myocardial infarction treated with urgent angioplasty and placement of a bare-metal stent. Subsequently, she underwent multivessel bypass surgery with LIMA to LAD, SVG to ramus SVG to RCA. She has no evidence of residual myocardial injury and has preserved left ventricular systolic function.  In 2010 a permanent pacemaker (Medtronic Enrhythm) was implanted for symptomatic bradycardia. She has a remote history of paroxysmal atrial fibrillation. She is not taking anticoagulants at this time. She does take aspirin.  In late 2013 she had a fall complicated by a hip fracture and radius fracture. Unfortunately during her treatment she had an esophageal perforation complicated by mediastinitis. She had a protracted illness and had a feeding tube for several months but this has now been removed and she has made a remarkably strong recovery for a 81 year old. She walks without assistance.  In June 2015, cardiac catheterization showed 90% proximal and mid LAD with patent distal LIMA bypass; 40% ostial circumflex, not bypassed; unchanged 80% proximal ramus intermedius, SVG to ramus occluded; patent stent right coronary artery, SVG to right coronary artery occluded. Her LVEF was 55%, there was no significant mitral regurgitation and the LVEDP was high normal at 15 mm Hg, (when she was short of breath). [name] stated that her dyspnea is likely multifactorial and cannot be explained completely by cardiac findings.  In October 2015 she had another fall in her home (no syncope, tripped at [facility].carpet transition) and fractured her pelvis and humerus. After 2 months of NH rehab, she is back at home, using  a walker. She has fairly severe hyperlipidemia with an LDL cholesterol in excess of 200. She is statin, niacin, ezetimibe and resin intolerant. She has discussed PSCK9 inhibitors with [employer]. She has systolic HTN, but also a tendency to marked orthostatic hypotension (25 mm Hg drop in SBP).  Her daughter brings in a very detailed log of the patient's BP. Usually SBP in 150s, DBP in 70s. On occasion standing SBP has been <100, once <70 and she was very symptomatic. Interrogation of her pacemaker shows normal device function. There is 56% atrial pacing and only 0.5% ventricular pacing. Battery voltage is good. Atrial flutter has been recorded at less than 0.1% of the time with all the episodes measured in seconds, but longer episodes, up to 4 minutes have been recorded recently. Activity level was 2.5 hours/day before her last fall, now slowly recovered to about 1 hour/day.  Allergies  Allergen Reactions  . Colestipol     Muscle aches  . Hydrocodone-Acetaminophen Itching    Tolerates tylenol  . Niacin And Related   . Statins     Muscle aches on all she has tried.  She recalls Lipitor, Crestor, and Livalo but thinks there were others  . Zetia [Ezetimibe]     Muscle aches    Current Outpatient Prescriptions  Medication Sig Dispense Refill  . acetaminophen (TYLENOL) 500 MG tablet Take 2 tablets (1,000 mg total) by mouth every 8 (eight) hours. (Patient taking differently: Take 1,000 mg by mouth every 8 (eight) hours as needed. ) 30 tablet 0  . albuterol (PROVENTIL HFA;VENTOLIN HFA) 108 (90 BASE) MCG/ACT inhaler Inhale 2  puffs into the lungs every 4 (four) hours as needed for wheezing or shortness of breath. 1 Inhaler 1  . aspirin EC 81 MG tablet Take 81 mg by mouth daily.    . Fluticasone-Salmeterol (ADVAIR) 100-50 MCG/DOSE AEPB Inhale 1 puff into the lungs 2 (two) times daily.     Marland Kitchen levothyroxine (SYNTHROID, LEVOTHROID) 75 MCG tablet Take 75 mcg by mouth daily before breakfast.    .  loratadine (CLARITIN) 10 MG tablet Take 10 mg by mouth daily.    Marland Kitchen LORazepam (ATIVAN) 1 MG tablet Take one tablet by mouth every morning as needed for anxiety 30 tablet 5  . losartan (COZAAR) 50 MG tablet Take 50 mg by mouth daily.    . metoprolol succinate (TOPROL-XL) 25 MG 24 hr tablet Take 0.5 tablets (12.5 mg total) by mouth daily.    . nitroGLYCERIN (NITROSTAT) 0.4 MG SL tablet Place 1 tablet (0.4 mg total) under the tongue every 5 (five) minutes x 3 doses as needed for chest pain. 25 tablet 12  . ondansetron (ZOFRAN) 4 MG tablet Take 4 mg by mouth every 6 (six) hours as needed for nausea or vomiting.    . pantoprazole (PROTONIX) 40 MG tablet Take 40 mg by mouth daily.    . traMADol (ULTRAM) 50 MG tablet Take one tablet by mouth every 6 hours as needed for moderate pain 120 tablet 5   No current facility-administered medications for this visit.    Past Medical History  Diagnosis Date  . Hypertension   . Hypercholesteremia   . Myocardial infarction     Inferior STEMI 07/2008 - PCI of prox RCA followed byu CABG x 3 in 9/'09  . Pneumonia   . Anxiety   . CAD in native artery     s/p CABG x 3; Last Myoview in 07/2009 - non-ischemic; Echo 03/2012  Aortic Sclerosis with normal EF.  Marland Kitchen Asthma   . Pacemaker   . GERD (gastroesophageal reflux disease)   . Arthritis   . Status post hip hemiarthroplasty left hip by Dr Ronnie Derby 08/08/2012  . PAF (paroxysmal atrial fibrillation)   . CKD (chronic kidney disease), stage III   . DVT (deep vein thrombosis) in pregnancy   . Esophageal perforation     9/13  . Mediastinitis     s/p drainage  . Hypothyroidism   . Shortness of breath   . Fall 07/21/2014    FALL WITH INJURY  . Fracture of head of humerus   . Fracture of hip 07/21/2014    LEFT  . SSS (sick sinus syndrome) 11/26/2014  . Paroxysmal atrial flutter 11/26/2014    Past Surgical History  Procedure Laterality Date  . Coronary artery bypass graft  2009    LIMA-LAD, SVG-RI, SVG-stented RCA    . Total hip arthroplasty    . Pacemaker insertion  01/06/2009    Medtronic  . Wrist fracture surgery  07/2012    left intra articular  with carpel tunnel  . Carpal tunnel release  08/08/2012    Procedure: CARPAL TUNNEL RELEASE;  Surgeon: Schuyler Amor, MD;  Location: [place];  Service: Orthopedics;  Laterality: Left;  . Hip arthroplasty  08/08/2012    Procedure: ARTHROPLASTY BIPOLAR HIP;  Surgeon: Rudean Haskell, MD;  Location: [place];  Service: Orthopedics;  Laterality: Left;  [place]   . Esophagogastroduodenoscopy  08/10/2012    Procedure: ESOPHAGOGASTRODUODENOSCOPY (EGD);  Surgeon: Beryle Beams, MD;  Location: [facility] ENDOSCOPY;  Service: Endoscopy;  Laterality: N/A;  .  Esophagoscopy  08/10/2012    Procedure: ESOPHAGOSCOPY;  Surgeon: [name], MD;  Location: [place];  Service: ENT;  Laterality: N/A;  . Gastrostomy  08/16/2012    Procedure: GASTROSTOMY;  Surgeon: Zenovia Jarred, MD;  Location: [place];  Service: General;  Laterality: N/A;  . Joint replacement    . Nm myocar perf wall motion  08/14/2009    Normal  . Left heart catheterization with coronary angiogram N/A 04/16/2014    Procedure: LEFT HEART CATHETERIZATION WITH CORONARY ANGIOGRAM;  Surgeon: Wellington Hampshire, MD;  Location: [place] CATH LAB;  Service: Cardiovascular;  Laterality: N/A;    Family History  Problem Relation Age of Onset  . Stroke Father     History   Social History  . Marital Status: Widowed    Spouse Name: N/A    Number of Children: N/A  . Years of Education: N/A   Occupational History  . Not on file.   Social History Main Topics  . Smoking status: Never Smoker   . Smokeless tobacco: Never Used  . Alcohol Use: No  . Drug Use: No  . Sexual Activity: No   Other Topics Concern  . Not on file   Social History Narrative    Review of systems: She is weak ever since the last fall. Complains of dizziness and lightheadedness, but no syncope. She has fatigue and lack of energy. She has no lower  extremity edema. She has not had wheezing, cough, fever or chills  The patient specifically denies any chest pain at rest or with exertion, orthopnea, paroxysmal nocturnal dyspnea, syncope, palpitations, focal neurological deficits, intermittent claudication,\unexplained weight gain, cough, hemoptysis.  The patient also denies abdominal pain, nausea, vomiting, dysphagia, diarrhea, constipation, polyuria, polydipsia, dysuria, hematuria, frequency, urgency, abnormal bleeding or bruising, fever, mood swings, change in skin or hair texture, change in voice quality, auditory or visual problems, allergic reactions or rashes, new musculoskeletal complaints other than usual "aches and pains".  PHYSICAL EXAM BP 171/84 mmHg  Pulse 76  Resp 16  Ht 5\' 5"  (1.651 m)  Wt 71.94 kg (158 lb 9.6 oz)  BMI 26.39 kg/m2 General: Alert, oriented x3, no distress  Head: no evidence of trauma, PERRL, EOMI, no exophtalmos or lid lag, no myxedema, no xanthelasma; normal ears, nose and oropharynx  Neck: normal jugular venous pulsations and no hepatojugular reflux; brisk carotid pulses without delay and no carotid bruits  Chest: Faint bilateral wheezing, no signs of consolidation by percussion or palpation, normal fremitus, symmetrical and full respiratory excursions; of the subclavian pacemaker site  Cardiovascular: normal position and quality of the apical impulse, regular rhythm, normal first and second heart sounds, no murmurs, rubs or gallops  Abdomen: no tenderness or distention, no masses by palpation, no abnormal pulsatility or arterial bruits, normal bowel sounds, no hepatosplenomegaly  Extremities: no clubbing, cyanosis or edema; 2+ radial, ulnar and brachial pulses bilaterally; 2+ right femoral, posterior tibial and dorsalis pedis pulses; 2+ left femoral, posterior tibial and dorsalis pedis pulses; no subclavian or femoral bruits  Neurological: grossly nonfocal   EKG: Atrial paced, ventricular sensed,  anterior fascicular block  Lipid Panel     Component Value Date/Time   CHOL 293* 04/16/2014 0422   TRIG 178* 04/16/2014 0422   HDL 47 04/16/2014 0422   CHOLHDL 6.2 04/16/2014 0422   VLDL 36 04/16/2014 0422   LDLCALC 210* 04/16/2014 0422    BMET    Component Value Date/Time   NA 137 07/31/2014 1444   K 5.0  07/31/2014 1444   CL 93* 07/31/2014 1444   CO2 33* 07/31/2014 1444   GLUCOSE 109* 07/31/2014 1444   BUN 22 07/31/2014 1444   CREATININE 1.23* 07/31/2014 1444   CREATININE 1.86* 07/15/2014 1206   CALCIUM 10.1 07/31/2014 1444   GFRNONAA 37* 07/31/2014 1444   GFRAA 43* 07/31/2014 1444     ASSESSMENT AND PLAN CAD, CABG X 3 10/09. Remote Stent RCA Currently free of angina. Preserved left ventricular systolic function. No evidence of congestive heart failure. Recent angiography shows 2 out of 3 bypass grafts occluded, but the only territory that is likely ischemic is the ramus intermedius. If she should develop angina this might be worth pursuing, but otherwise best left for medical therapy.  SSS/Pacemaker - dual chamber Medtronic Enrhythm, 2010 Normal device function. Plan Q3 month [employer] remote monitoring. Will turn on atrial ATP for her atrial flutter, hopefully reduce the likelihood of sustained atrial fibrillation or atrial flutter.  PAFib and PAFlutter Years ago, she had been on warfarin but interrogation of her pacemaker over the last four years has not shown atrial fibrillation. Recent episodes of atrial flutter may have been related to stress of injury/recovery. She is prone to falls, so I think full anticoagulation would be dangerous. She is currently on aspirin only for stroke prevention   Hyperlipidemia, statin intolerant  She has very severe hypercholesterolemia and unfortunately have not identified any regimen that she can tolerate. She developed severe aching in her thigh muscles with every lipid-lowering agent that has been tried. This includes numerous statins  but also Zetia and colestipol. Niacin was poorly tolerated even in low doses. We'll have to wait until PSCK9 inhibitors become indicated as monotherapy.   Hypertension / orthostatic hypotension Probably this is the strictest blood pressure control we can achieve. Stay well hydrated. Weight at home has been stable at 152-155 and daughter has not administered any diuretics since return from NH. Insurance will no longer cover metoprolol succinate. Will switch to atenolol 12.5 mg daily  Orders Placed This Encounter  Procedures  . Implantable device check   Meds ordered this encounter  Medications  . loratadine (CLARITIN) 10 MG tablet    Sig: Take 10 mg by mouth daily.    Holli Humbles, MD, [place] [phone] office [phone] pager

## 2014-12-01 ENCOUNTER — Telehealth: Payer: Self-pay | Admitting: *Deleted

## 2014-12-01 DIAGNOSIS — E782 Mixed hyperlipidemia: Secondary | ICD-10-CM | POA: Diagnosis not present

## 2014-12-01 DIAGNOSIS — R7301 Impaired fasting glucose: Secondary | ICD-10-CM | POA: Diagnosis not present

## 2014-12-01 DIAGNOSIS — Z6825 Body mass index (BMI) 25.0-25.9, adult: Secondary | ICD-10-CM | POA: Diagnosis not present

## 2014-12-01 DIAGNOSIS — E063 Autoimmune thyroiditis: Secondary | ICD-10-CM | POA: Diagnosis not present

## 2014-12-01 DIAGNOSIS — I10 Essential (primary) hypertension: Secondary | ICD-10-CM | POA: Diagnosis not present

## 2014-12-01 DIAGNOSIS — J302 Other seasonal allergic rhinitis: Secondary | ICD-10-CM | POA: Diagnosis not present

## 2014-12-01 MED ORDER — ATENOLOL 25 MG PO TABS: 12.5000 mg | ORAL_TABLET | Freq: Every day | ORAL | Status: DC

## 2014-12-01 NOTE — Telephone Encounter (Signed)
-----   Message from Sanda Klein, MD sent at 11/26/2014 11:31 AM EST ----- Received note from insurance they will no longer cover Toprol XL. Switch to atenolol 12.5 mg daily when runs out of current rx, please. MCr

## 2014-12-02 ENCOUNTER — Encounter: Payer: Self-pay | Admitting: Cardiovascular Disease

## 2014-12-10 DIAGNOSIS — H04123 Dry eye syndrome of bilateral lacrimal glands: Secondary | ICD-10-CM | POA: Diagnosis not present

## 2014-12-29 DIAGNOSIS — L570 Actinic keratosis: Secondary | ICD-10-CM | POA: Diagnosis not present

## 2014-12-29 DIAGNOSIS — Z85828 Personal history of other malignant neoplasm of skin: Secondary | ICD-10-CM | POA: Diagnosis not present

## 2015-01-07 DIAGNOSIS — H04123 Dry eye syndrome of bilateral lacrimal glands: Secondary | ICD-10-CM | POA: Diagnosis not present

## 2015-01-08 ENCOUNTER — Other Ambulatory Visit (INDEPENDENT_AMBULATORY_CARE_PROVIDER_SITE_OTHER): Payer: Self-pay | Admitting: Internal Medicine

## 2015-01-28 DIAGNOSIS — H01005 Unspecified blepharitis left lower eyelid: Secondary | ICD-10-CM | POA: Diagnosis not present

## 2015-01-28 DIAGNOSIS — H01001 Unspecified blepharitis right upper eyelid: Secondary | ICD-10-CM | POA: Diagnosis not present

## 2015-01-28 DIAGNOSIS — H01004 Unspecified blepharitis left upper eyelid: Secondary | ICD-10-CM | POA: Diagnosis not present

## 2015-01-28 DIAGNOSIS — H01002 Unspecified blepharitis right lower eyelid: Secondary | ICD-10-CM | POA: Diagnosis not present

## 2015-02-11 DIAGNOSIS — Z85828 Personal history of other malignant neoplasm of skin: Secondary | ICD-10-CM | POA: Diagnosis not present

## 2015-02-11 DIAGNOSIS — L859 Epidermal thickening, unspecified: Secondary | ICD-10-CM | POA: Diagnosis not present

## 2015-02-11 DIAGNOSIS — L570 Actinic keratosis: Secondary | ICD-10-CM | POA: Diagnosis not present

## 2015-02-11 DIAGNOSIS — D485 Neoplasm of uncertain behavior of skin: Secondary | ICD-10-CM | POA: Diagnosis not present

## 2015-02-18 ENCOUNTER — Telehealth: Payer: Self-pay | Admitting: Cardiovascular Disease

## 2015-02-19 NOTE — Telephone Encounter (Signed)
Close encounter 

## 2015-02-24 ENCOUNTER — Ambulatory Visit (INDEPENDENT_AMBULATORY_CARE_PROVIDER_SITE_OTHER): Payer: Medicare Other | Admitting: *Deleted

## 2015-02-24 DIAGNOSIS — I495 Sick sinus syndrome: Secondary | ICD-10-CM | POA: Diagnosis not present

## 2015-02-24 NOTE — Progress Notes (Signed)
Remote pacemaker transmission.   

## 2015-02-25 LAB — MDC_IDC_ENUM_SESS_TYPE_REMOTE
Brady Statistic AP VS Percent: 62.74 %
Brady Statistic AS VS Percent: 36.86 %
Brady Statistic RV Percent Paced: 0.41 %
Date Time Interrogation Session: 20160426130757
Lead Channel Impedance Value: 736 Ohm
Lead Channel Sensing Intrinsic Amplitude: 17.495 mV
Lead Channel Setting Pacing Amplitude: 3 V
Lead Channel Setting Sensing Sensitivity: 0.9 mV
MDC IDC MSMT BATTERY VOLTAGE: 2.97 V
MDC IDC MSMT LEADCHNL RA IMPEDANCE VALUE: 536 Ohm
MDC IDC MSMT LEADCHNL RA SENSING INTR AMPL: 2.858 mV
MDC IDC SET LEADCHNL RA PACING AMPLITUDE: 2 V
MDC IDC SET LEADCHNL RV PACING PULSEWIDTH: 0.8 ms
MDC IDC STAT BRADY AP VP PERCENT: 0.3 %
MDC IDC STAT BRADY AS VP PERCENT: 0.1 %
MDC IDC STAT BRADY RA PERCENT PACED: 63.04 %
Zone Setting Detection Interval: 400 ms
Zone Setting Detection Interval: 450 ms

## 2015-03-03 ENCOUNTER — Encounter: Payer: Self-pay | Admitting: Cardiology

## 2015-03-05 ENCOUNTER — Encounter: Payer: Self-pay | Admitting: Cardiovascular Disease

## 2015-03-05 ENCOUNTER — Ambulatory Visit (INDEPENDENT_AMBULATORY_CARE_PROVIDER_SITE_OTHER): Payer: Medicare Other | Admitting: Cardiology

## 2015-03-05 VITALS — BP 228/96 | HR 73 | Ht 65.5 in | Wt 157.2 lb

## 2015-03-05 DIAGNOSIS — R0609 Other forms of dyspnea: Secondary | ICD-10-CM

## 2015-03-05 DIAGNOSIS — Z79899 Other long term (current) drug therapy: Secondary | ICD-10-CM

## 2015-03-05 DIAGNOSIS — R0600 Dyspnea, unspecified: Secondary | ICD-10-CM

## 2015-03-05 LAB — BASIC METABOLIC PANEL
BUN: 18 mg/dL (ref 6–23)
CHLORIDE: 101 meq/L (ref 96–112)
CO2: 27 meq/L (ref 19–32)
CREATININE: 1.19 mg/dL — AB (ref 0.50–1.10)
Calcium: 10.2 mg/dL (ref 8.4–10.5)
Glucose, Bld: 81 mg/dL (ref 70–99)
Potassium: 4.7 mEq/L (ref 3.5–5.3)
Sodium: 140 mEq/L (ref 135–145)

## 2015-03-05 MED ORDER — AMLODIPINE BESYLATE 5 MG PO TABS: 5.0000 mg | ORAL_TABLET | Freq: Every day | ORAL | Status: DC

## 2015-03-05 NOTE — Patient Instructions (Addendum)
Your physician recommends that you schedule a follow-up appointment in: 1 Week for Blood Pressure check  Your physician has requested that you have a lexiscan myoview. For further information please visit [email or web address]. Please follow instruction sheet, as given.  Your physician has recommended you make the following change in your medication: Start Amlodipine 5 mg daily  Your physician recommends that you return for lab work in: Today BMP

## 2015-03-05 NOTE — Progress Notes (Signed)
03/05/2015 Amy [name]   September 30, 1922  [id number]  Primary Physician Purvis Kilts, MD Primary Cardiologist: Dr. Sallyanne Kuster  HPI:  The patient is a 81 year old female, followed by Dr. Sallyanne Kuster, with a history of HTN CAD with history of remote CABG. She also has a pacemaker. She was admitted to [facility] on 04/15/2014 for evaluation of exertional chest discomfort. Cardiac enzymes were cycled and were negative for MI. As her symptoms were concerning for progressive/unstable angina, she was referred for cardiac catheterization. This was performed by [name] (see full cath report in chart). She has significant CAD, but LIMA-LAD is patent and the RCA is also patent. Her ramus intermedius has an 80% stenosis but this is unchanged since her previous catheterization. The SVG to ramus intermedius and the SVG to RCA are both occluded. [name] reviewed the films and felt medical therapy was the best option. Her medications were adjusted. She was discharged home on 04/17/2014.  She presents to clinic today with complaints of elevated BP. She developed a slight HA earlier today and checked her BP at home which was severely elevated x 2 at  211/86 and 208/95. She denied any other symptoms other than HA. No chest pain currently and no dyspnea, stroke like symptoms, syncope/ near syncope. She reports that she does not usually check BP at home on a routien basis. She reports full medication compliance. Denies any recent high sodium foods. No decreased UOP. No excess caffieen. She does not smoke.  Initial BP in clinic was 228/96. Her HA has resolved and she continues to deny any symptoms currently. BP was rechecked x 3 after periord of rest in clinic and improved to 132/68, then increased back up to 150/82 and 162/78.  Although, she has not had any chest pain today associated with her high BP, she does note frequent brief episodes of  substernal chest burning/ heaviness that occurs several times per  week.  Can occur at rest and with exertion. Last less than 5 minutes and resolves spontaneously. She does not feel that it is acid reflux. No relationship with meals.    Current Outpatient Prescriptions  Medication Sig Dispense Refill  . acetaminophen (TYLENOL) 500 MG tablet Take 2 tablets (1,000 mg total) by mouth every 8 (eight) hours. 30 tablet 0  . albuterol (PROVENTIL HFA;VENTOLIN HFA) 108 (90 BASE) MCG/ACT inhaler Inhale 2 puffs into the lungs every 4 (four) hours as needed for wheezing or shortness of breath. 1 Inhaler 1  . aspirin EC 81 MG tablet Take 81 mg by mouth daily.    Marland Kitchen atenolol (TENORMIN) 25 MG tablet Take 0.5 tablets (12.5 mg total) by mouth daily. 45 tablet 3  . cycloSPORINE (RESTASIS) 0.05 % ophthalmic emulsion Place 1 drop into both eyes 2 (two) times daily.    . Fluticasone-Salmeterol (ADVAIR) 100-50 MCG/DOSE AEPB Inhale 1 puff into the lungs 2 (two) times daily.     Marland Kitchen levothyroxine (SYNTHROID, LEVOTHROID) 75 MCG tablet Take 75 mcg by mouth daily before breakfast.    . loratadine (CLARITIN) 10 MG tablet Take 10 mg by mouth daily.    Marland Kitchen LORazepam (ATIVAN) 1 MG tablet Take one tablet by mouth every morning as needed for anxiety 30 tablet 5  . losartan (COZAAR) 50 MG tablet Take 50 mg by mouth daily.    . nitroGLYCERIN (NITROSTAT) 0.4 MG SL tablet Place 1 tablet (0.4 mg total) under the tongue every 5 (five) minutes x 3 doses as needed for chest pain.  25 tablet 12  . ondansetron (ZOFRAN) 4 MG tablet Take 4 mg by mouth every 6 (six) hours as needed for nausea or vomiting.    . pantoprazole (PROTONIX) 40 MG tablet Take 40 mg by mouth daily.    . pantoprazole (PROTONIX) 40 MG tablet TAKE ONE TABLET BY MOUTH ONCE DAILY 60 tablet 0  . traMADol (ULTRAM) 50 MG tablet Take one tablet by mouth every 6 hours as needed for moderate pain 120 tablet 5   No current facility-administered medications for this visit.    Allergies  Allergen Reactions  . Colestipol     Muscle aches  .  Hydrocodone-Acetaminophen Itching    Tolerates tylenol  . Niacin And Related   . Statins     Muscle aches on all she has tried.  She recalls Lipitor, Crestor, and Livalo but thinks there were others  . Zetia [Ezetimibe]     Muscle aches    History   Social History  . Marital Status: Widowed    Spouse Name: N/A  . Number of Children: N/A  . Years of Education: N/A   Occupational History  . Not on file.   Social History Main Topics  . Smoking status: Never Smoker   . Smokeless tobacco: Never Used  . Alcohol Use: No  . Drug Use: No  . Sexual Activity: No   Other Topics Concern  . Not on file   Social History Narrative     Review of Systems: General: negative for chills, fever, night sweats or weight changes.  Cardiovascular: negative for chest pain, dyspnea on exertion, edema, orthopnea, palpitations, paroxysmal nocturnal dyspnea or shortness of breath Dermatological: negative for rash Respiratory: negative for cough or wheezing Urologic: negative for hematuria Abdominal: negative for nausea, vomiting, diarrhea, bright red blood per rectum, melena, or hematemesis Neurologic: negative for visual changes, syncope, or dizziness All other systems reviewed and are otherwise negative except as noted above.    Blood pressure 228/96, pulse 73, height 5' 5.5" (1.664 m), weight 157 lb 3.2 oz (71.305 kg).  General appearance: alert, cooperative and no distress Neck: no carotid bruit and no JVD Lungs: clear to auscultation bilaterally Heart: normal apical impulse Extremities: no LEE Pulses: 2+ and symmetric Skin: warm and dry Neurologic: Grossly normal  EKG NSR HR 73 bpm   ASSESSMENT AND PLAN:   1. HTN: Severe with mild HA, but no chest pain today and no dyspnea. BP improved after rechecking in clinic to the 0000000 systolic. She has a h/o renal insufficieny, thus will not further increase her losartan. Continue current dose of ABR as well as BB. Will add low dose  amlodipine. Patient advised to avoid salt and to monitor BP closely at home. She is to call if persistent hypertension, any hypotension or development or new/worsening symptoms. Will f/u with BP check next week. Will also check BMP to assess renal function.   2. Chest Pain: Currently CP free, but notes recent history of intermittent SSCP. EKG shows NSR w/o ischemia. She has a known h/o CAD. Will risk stratify with a NST. Continue medical therapy for now.    PLAN  Add 5 mg of amlodipine. Check BMP to assess renal function. F/u BP check in 1 week. NST to r/o ischemia.   SIMMONS, BRITTAINYPA-C 03/05/2015 10:26 AM

## 2015-03-09 ENCOUNTER — Encounter: Payer: Self-pay | Admitting: Cardiology

## 2015-03-09 ENCOUNTER — Encounter: Payer: Self-pay | Admitting: Cardiovascular Disease

## 2015-03-12 ENCOUNTER — Telehealth (HOSPITAL_COMMUNITY): Payer: Self-pay

## 2015-03-12 ENCOUNTER — Ambulatory Visit (INDEPENDENT_AMBULATORY_CARE_PROVIDER_SITE_OTHER): Payer: Medicare Other | Admitting: Pharmacist Clinician (PhC)/ Clinical Pharmacy Specialist

## 2015-03-12 VITALS — BP 148/76 | HR 72 | Ht 65.5 in | Wt 157.4 lb

## 2015-03-12 DIAGNOSIS — I10 Essential (primary) hypertension: Secondary | ICD-10-CM

## 2015-03-12 DIAGNOSIS — I2510 Atherosclerotic heart disease of native coronary artery without angina pectoris: Secondary | ICD-10-CM | POA: Diagnosis not present

## 2015-03-12 MED ORDER — LOSARTAN POTASSIUM 100 MG PO TABS: 100.0000 mg | ORAL_TABLET | Freq: Every day | ORAL | Status: DC

## 2015-03-12 NOTE — Patient Instructions (Signed)
   Your blood pressure today is 148/76   Check your blood pressure at home daily and keep record of the readings.  Take your BP meds as follows: stop amlodipine for now, increase losartan to 100 mg once daily  Bring all of your meds and your record of home blood pressures to your next appointment.  Exercise as you're able, try to walk approximately 30 minutes per day.  Keep salt intake to a minimum, especially watch canned and prepared boxed foods.  Eat more fresh fruits and vegetables and fewer canned items.  Avoid eating in fast food restaurants.    HOW TO TAKE YOUR BLOOD PRESSURE: . Rest 5 minutes before taking your blood pressure. .  Don't smoke or drink caffeinated beverages for at least 30 minutes before. . Take your blood pressure before (not after) you eat. . Sit comfortably with your back supported and both feet on the floor (don't cross your legs). . Elevate your arm to heart level on a table or a desk. . Use the proper sized cuff. It should fit smoothly and snugly around your bare upper arm. There should be enough room to slip a fingertip under the cuff. The bottom edge of the cuff should be 1 inch above the crease of the elbow. . Ideally, take 3 measurements at one sitting and record the average.

## 2015-03-12 NOTE — Telephone Encounter (Signed)
Encounter complete. 

## 2015-03-13 ENCOUNTER — Encounter: Payer: Self-pay | Admitting: Pharmacist Clinician (PhC)/ Clinical Pharmacy Specialist

## 2015-03-13 ENCOUNTER — Other Ambulatory Visit: Payer: Self-pay | Admitting: Pharmacist Clinician (PhC)/ Clinical Pharmacy Specialist

## 2015-03-13 NOTE — Progress Notes (Signed)
03/13/2015 Amy [name] 03/13/1922 [id number]   HPI:  Amy [name] is a 81 y.o. female patient of Dr Sallyanne Kuster, with a PMH below who presents today for hypertension clinic evaluation.  She was recently seen by Lyda Jester PA and found to have a BP of 228/96.  At the time she was taking losartan 50 mg daily and atenolol 12.5 mg daily.  Brittainy started her on amlodipine 5 mg.  Today she complains of increased edema in her feet and ankles since starting the amlodipine.  Cardiac Hx: CAD w/ remote CABG  Social Hx: some caffeine (drinks twice brewed coffee), no alcohol or tobacco  Home BP readings: have been quite varied, systolic from [id number], with an average of 134.  Diastolic readings were all between 67-76, with one reading at 103.    Current antihypertensive medications: losartan 50 mg daily, amlodipine 5 mg daily, atenolol 12.5 mg daily.   Current Outpatient Prescriptions  Medication Sig Dispense Refill  . albuterol (PROVENTIL HFA;VENTOLIN HFA) 108 (90 BASE) MCG/ACT inhaler Inhale 2 puffs into the lungs every 4 (four) hours as needed for wheezing or shortness of breath. 1 Inhaler 1  . aspirin EC 81 MG tablet Take 81 mg by mouth daily.    Marland Kitchen atenolol (TENORMIN) 25 MG tablet Take 0.5 tablets (12.5 mg total) by mouth daily. 45 tablet 3  . cycloSPORINE (RESTASIS) 0.05 % ophthalmic emulsion Place 1 drop into both eyes 2 (two) times daily.    . Fluticasone-Salmeterol (ADVAIR) 100-50 MCG/DOSE AEPB Inhale 1 puff into the lungs 2 (two) times daily.     Marland Kitchen levothyroxine (SYNTHROID, LEVOTHROID) 75 MCG tablet Take 75 mcg by mouth daily before breakfast.    . loratadine (CLARITIN) 10 MG tablet Take 10 mg by mouth daily.    Marland Kitchen LORazepam (ATIVAN) 1 MG tablet Take one tablet by mouth every morning as needed for anxiety 30 tablet 5  . losartan (COZAAR) 100 MG tablet Take 1 tablet (100 mg total) by mouth daily. 90 tablet 3  . nitroGLYCERIN (NITROSTAT) 0.4 MG SL tablet Place 1 tablet (0.4 mg  total) under the tongue every 5 (five) minutes x 3 doses as needed for chest pain. 25 tablet 12  . ondansetron (ZOFRAN) 4 MG tablet Take 4 mg by mouth every 6 (six) hours as needed for nausea or vomiting.    . pantoprazole (PROTONIX) 40 MG tablet Take 40 mg by mouth daily.    . pantoprazole (PROTONIX) 40 MG tablet TAKE ONE TABLET BY MOUTH ONCE DAILY 60 tablet 0  . traMADol (ULTRAM) 50 MG tablet Take one tablet by mouth every 6 hours as needed for moderate pain 120 tablet 5   No current facility-administered medications for this visit.    Allergies  Allergen Reactions  . Colestipol     Muscle aches  . Hydrocodone-Acetaminophen Itching    Tolerates tylenol  . Niacin And Related   . Statins     Muscle aches on all she has tried.  She recalls Lipitor, Crestor, and Livalo but thinks there were others  . Zetia [Ezetimibe]     Muscle aches    Past Medical History  Diagnosis Date  . Hypertension   . Hypercholesteremia   . Myocardial infarction     Inferior STEMI 07/2008 - PCI of prox RCA followed byu CABG x 3 in 9/'09  . Pneumonia   . Anxiety   . CAD in native artery     s/p CABG x 3; Last Myoview  in 07/2009 - non-ischemic; Echo 03/2012  Aortic Sclerosis with normal EF.  Marland Kitchen Asthma   . Pacemaker   . GERD (gastroesophageal reflux disease)   . Arthritis   . Status post hip hemiarthroplasty left hip by Dr Ronnie Derby 08/08/2012  . PAF (paroxysmal atrial fibrillation)   . CKD (chronic kidney disease), stage III   . DVT (deep vein thrombosis) in pregnancy   . Esophageal perforation     9/13  . Mediastinitis     s/p drainage  . Hypothyroidism   . Shortness of breath   . Fall 07/21/2014    FALL WITH INJURY  . Fracture of head of humerus   . Fracture of hip 07/21/2014    LEFT  . SSS (sick sinus syndrome) 11/26/2014  . Paroxysmal atrial flutter 11/26/2014    Blood pressure 148/76, pulse 72, height 5' 5.5" (1.664 m), weight 157 lb 6.4 oz (71.396 kg).    Tommy Medal PharmD CPP [facility]

## 2015-03-13 NOTE — Assessment & Plan Note (Addendum)
Today her BP looks much better at 148/76.  She brings with her a home Omron cuff, about 22-79 years old, which gave a reading of 155/76.  Accurate to within 10 points.  Based on that, although she does have large fluctuations in her BP, they are more WNL since starting the amlodipine.  Unfortunately it is causing her to have edema.   I have asked her to discontinue the amlodipine for now and increase the losartan to 100 mg daily.  She will continue with once daily home BP checks.  She also suffers from severe motion sickness, to the point that she used ondansetron today to make the car trip from [place].  She is due to see Dr. Sallyanne Kuster in August and we can check her BP again at that time.  She knows to call if the home readings begin trending upward close to 200 again.

## 2015-03-17 ENCOUNTER — Ambulatory Visit (HOSPITAL_COMMUNITY)
Admission: RE | Admit: 2015-03-17 | Discharge: 2015-03-17 | Disposition: A | Discharge status: Home / Self Care | Payer: Medicare Other | Source: Ambulatory Visit | Attending: Cardiovascular Disease | Admitting: Cardiovascular Disease

## 2015-03-17 ENCOUNTER — Telehealth: Payer: Self-pay | Admitting: Cardiovascular Disease

## 2015-03-17 DIAGNOSIS — R0609 Other forms of dyspnea: Secondary | ICD-10-CM | POA: Diagnosis not present

## 2015-03-17 DIAGNOSIS — I10 Essential (primary) hypertension: Secondary | ICD-10-CM | POA: Diagnosis not present

## 2015-03-17 DIAGNOSIS — R0600 Dyspnea, unspecified: Secondary | ICD-10-CM

## 2015-03-17 LAB — MYOCARDIAL PERFUSION IMAGING
CHL CUP STRESS STAGE 1 DBP: 78 mmHg
CHL CUP STRESS STAGE 1 SBP: 158 mmHg
CHL CUP STRESS STAGE 3 GRADE: 0 %
CHL CUP STRESS STAGE 3 HR: 71 {beats}/min
CHL CUP STRESS STAGE 3 SPEED: 0 mph
CHL CUP STRESS STAGE 4 DBP: 74 mmHg
CHL CUP STRESS STAGE 4 GRADE: 0 %
CHL CUP STRESS STAGE 4 HR: 69 {beats}/min
CSEPPBP: 172 mmHg
CSEPPHR: 71 {beats}/min
CSEPPMHR: 55 %
Estimated workload: 1 METS
LV dias vol: 56 mL
LV sys vol: 16 mL
NUC STRESS EF: 71 %
Rest HR: 71 {beats}/min
SDS: 2
SRS: 2
SSS: 4
Stage 1 Grade: 0 %
Stage 1 HR: 71 {beats}/min
Stage 1 Speed: 0 mph
Stage 2 Grade: 0 %
Stage 2 HR: 71 {beats}/min
Stage 2 Speed: 0 mph
Stage 3 DBP: 67 mmHg
Stage 3 SBP: 172 mmHg
Stage 4 SBP: 144 mmHg
Stage 4 Speed: 0 mph
TID: 1.67

## 2015-03-17 MED ORDER — AMINOPHYLLINE 25 MG/ML IV SOLN
75.0000 mg | Freq: Once | INTRAVENOUS | Status: AC
  Administered 2015-03-17: 75 mg via INTRAVENOUS

## 2015-03-17 MED ORDER — TECHNETIUM TC 99M SESTAMIBI GENERIC - CARDIOLITE
32.5000 | Freq: Once | INTRAVENOUS | Status: AC | PRN
Start: 2015-03-17 — End: 2015-03-17
  Administered 2015-03-17: 33 via INTRAVENOUS

## 2015-03-17 MED ORDER — TECHNETIUM TC 99M SESTAMIBI GENERIC - CARDIOLITE
10.9000 | Freq: Once | INTRAVENOUS | Status: AC | PRN
  Administered 2015-03-17: 10.9 via INTRAVENOUS

## 2015-03-17 MED ORDER — REGADENOSON 0.4 MG/5ML IV SOLN
0.4000 mg | Freq: Once | INTRAVENOUS | Status: AC
  Administered 2015-03-17: 0.4 mg via INTRAVENOUS

## 2015-03-17 NOTE — Telephone Encounter (Signed)
Pt had test today at 1:30

## 2015-03-17 NOTE — Telephone Encounter (Signed)
Having stress test today,,wants to know if is all right to take her medicine.

## 2015-03-24 ENCOUNTER — Ambulatory Visit (INDEPENDENT_AMBULATORY_CARE_PROVIDER_SITE_OTHER): Payer: Medicare Other | Admitting: Internal Medicine

## 2015-03-24 ENCOUNTER — Encounter (INDEPENDENT_AMBULATORY_CARE_PROVIDER_SITE_OTHER): Payer: Self-pay | Admitting: Internal Medicine

## 2015-03-24 VITALS — BP 110/62 | HR 76 | Temp 97.7°F | Ht 65.5 in | Wt 154.6 lb

## 2015-03-24 DIAGNOSIS — K219 Gastro-esophageal reflux disease without esophagitis: Secondary | ICD-10-CM | POA: Diagnosis not present

## 2015-03-24 DIAGNOSIS — I2510 Atherosclerotic heart disease of native coronary artery without angina pectoris: Secondary | ICD-10-CM | POA: Diagnosis not present

## 2015-03-24 DIAGNOSIS — K5909 Other constipation: Secondary | ICD-10-CM

## 2015-03-24 MED ORDER — OMEPRAZOLE 20 MG PO CPDR: 20.0000 mg | DELAYED_RELEASE_CAPSULE | Freq: Every day | ORAL | Status: DC

## 2015-03-24 NOTE — Patient Instructions (Addendum)
Benefiber daily. Rx for Omeprazole to her pharmacy OV in 4 weeks.

## 2015-03-24 NOTE — Progress Notes (Addendum)
Subjective:    Patient ID: Amy [name], female    DOB: 1922-10-28, 81 y.o.   MRN: [id number]  HPI Presents today with c/o constipation. She tells me she is having problems with constipation. She usually can eat prunes and have a BM.  Last week she took a Supp and a Doculax and she had a BM. She has been eating raisin bran, prunes, and fiber and really nothing is working.  Last week it was 6 days before she had a BM. Her last BM was this am. She had diarrhea Saturday. She had a normal BM Sunday. She says if she has diarrhea she will go a day or two before she has a BM. Appetite is good. She has lost about 5 pounds since her lata visit in December. She has been constipated off and on since December. She was on iron [facility]. She tells me the Protonix is really not helping her acid reflux. She want to start the Prilosec back.  She has a caregiver that stays during the day and her daughter visits her in the evening.  She does not cook. She may unthaw something from the freezer or fix a sandwich. Daughter and caregiver cook for her.  BMET    Component Value Date/Time   NA 140 03/05/2015 1157   K 4.7 03/05/2015 1157   CL 101 03/05/2015 1157   CO2 27 03/05/2015 1157   GLUCOSE 81 03/05/2015 1157   BUN 18 03/05/2015 1157   CREATININE 1.19* 03/05/2015 1157   CREATININE 1.23* 07/31/2014 1444   CALCIUM 10.2 03/05/2015 1157   GFRNONAA 37* 07/31/2014 1444   GFRAA 43* 07/31/2014 1444     CBC    Component Value Date/Time   WBC 10.1 07/31/2014 1444   RBC 3.76* 07/31/2014 1444   HGB 11.3* 07/31/2014 1444   HCT 35.0* 07/31/2014 1444   PLT 301 07/31/2014 1444   MCV 93.1 07/31/2014 1444   MCH 30.1 07/31/2014 1444   MCHC 32.3 07/31/2014 1444   RDW 13.5 07/31/2014 1444   LYMPHSABS 1.0 07/31/2014 1444   MONOABS 0.6 07/31/2014 1444   EOSABS 0.2 07/31/2014 1444   BASOSABS 0.0 07/31/2014 1444       Review of Systems Past Medical History  Diagnosis Date  .  Hypertension   . Hypercholesteremia   . Myocardial infarction     Inferior STEMI 07/2008 - PCI of prox RCA followed byu CABG x 3 in 9/'09  . Pneumonia   . Anxiety   . CAD in native artery     s/p CABG x 3; Last Myoview in 07/2009 - non-ischemic; Echo 03/2012  Aortic Sclerosis with normal EF.  Marland Kitchen Asthma   . Pacemaker   . GERD (gastroesophageal reflux disease)   . Arthritis   . Status post hip hemiarthroplasty left hip by Dr Ronnie Derby 08/08/2012  . PAF (paroxysmal atrial fibrillation)   . CKD (chronic kidney disease), stage III   . DVT (deep vein thrombosis) in pregnancy   . Esophageal perforation     9/13  . Mediastinitis     s/p drainage  . Hypothyroidism   . Shortness of breath   . Fall 07/21/2014    FALL WITH INJURY  . Fracture of head of humerus   . Fracture of hip 07/21/2014    LEFT  . SSS (sick sinus syndrome) 11/26/2014  . Paroxysmal atrial flutter 11/26/2014    Past Surgical History  Procedure Laterality Date  . Coronary  artery bypass graft  2009    LIMA-LAD, SVG-RI, SVG-stented RCA  . Total hip arthroplasty    . Pacemaker insertion  01/06/2009    Medtronic  . Wrist fracture surgery  07/2012    left intra articular  with carpel tunnel  . Carpal tunnel release  08/08/2012    Procedure: CARPAL TUNNEL RELEASE;  Surgeon: Schuyler Amor, MD;  Location: [place];  Service: Orthopedics;  Laterality: Left;  . Hip arthroplasty  08/08/2012    Procedure: ARTHROPLASTY BIPOLAR HIP;  Surgeon: Rudean Haskell, MD;  Location: [place];  Service: Orthopedics;  Laterality: Left;  [place]   . Esophagogastroduodenoscopy  08/10/2012    Procedure: ESOPHAGOGASTRODUODENOSCOPY (EGD);  Surgeon: Beryle Beams, MD;  Location: [facility] ENDOSCOPY;  Service: Endoscopy;  Laterality: N/A;  . Esophagoscopy  08/10/2012    Procedure: ESOPHAGOSCOPY;  Surgeon: [name], MD;  Location: [place];  Service: ENT;  Laterality: N/A;  . Gastrostomy  08/16/2012    Procedure: GASTROSTOMY;  Surgeon: Zenovia Jarred, MD;   Location: [place];  Service: General;  Laterality: N/A;  . Joint replacement    . Nm myocar perf wall motion  08/14/2009    Normal  . Left heart catheterization with coronary angiogram N/A 04/16/2014    Procedure: LEFT HEART CATHETERIZATION WITH CORONARY ANGIOGRAM;  Surgeon: Wellington Hampshire, MD;  Location: [place] CATH LAB;  Service: Cardiovascular;  Laterality: N/A;    Allergies  Allergen Reactions  . Colestipol     Muscle aches  . Hydrocodone-Acetaminophen Itching    Tolerates tylenol  . Niacin And Related   . Statins     Muscle aches on all she has tried.  She recalls Lipitor, Crestor, and Livalo but thinks there were others  . Zetia [Ezetimibe]     Muscle aches    Current Outpatient Prescriptions on File Prior to Visit  Medication Sig Dispense Refill  . albuterol (PROVENTIL HFA;VENTOLIN HFA) 108 (90 BASE) MCG/ACT inhaler Inhale 2 puffs into the lungs every 4 (four) hours as needed for wheezing or shortness of breath. 1 Inhaler 1  . aspirin EC 81 MG tablet Take 81 mg by mouth daily.    Marland Kitchen atenolol (TENORMIN) 25 MG tablet Take 0.5 tablets (12.5 mg total) by mouth daily. 45 tablet 3  . cycloSPORINE (RESTASIS) 0.05 % ophthalmic emulsion Place 1 drop into both eyes 2 (two) times daily.    . Fluticasone-Salmeterol (ADVAIR) 100-50 MCG/DOSE AEPB Inhale 1 puff into the lungs 2 (two) times daily.     Marland Kitchen levothyroxine (SYNTHROID, LEVOTHROID) 75 MCG tablet Take 75 mcg by mouth daily before breakfast.    . loratadine (CLARITIN) 10 MG tablet Take 10 mg by mouth daily as needed.     Marland Kitchen LORazepam (ATIVAN) 1 MG tablet Take one tablet by mouth every morning as needed for anxiety 30 tablet 5  . losartan (COZAAR) 100 MG tablet Take 1 tablet (100 mg total) by mouth daily. 90 tablet 3  . LOTEMAX 0.5 % OINT Use as directed    . nitroGLYCERIN (NITROSTAT) 0.4 MG SL tablet Place 1 tablet (0.4 mg total) under the tongue every 5 (five) minutes x 3 doses as needed for chest pain. 25 tablet 12  . ondansetron (ZOFRAN)  4 MG tablet Take 4 mg by mouth every 6 (six) hours as needed for nausea or vomiting.    . pantoprazole (PROTONIX) 40 MG tablet Take 40 mg by mouth daily.     No current facility-administered medications on file prior  to visit.        Objective:   Physical ExamBlood pressure 110/62, pulse 76, temperature 97.7 F (36.5 C), height 5' 5.5" (1.664 m), weight 154 lb 9.6 oz (70.126 kg).  Alert and oriented. Skin warm and dry. Oral mucosa is moist.   . Sclera anicteric, conjunctivae is pink. Thyroid not enlarged. No cervical lymphadenopathy. Lungs clear. Heart regular rate and rhythm.  Abdomen is soft. Bowel sounds are positive. No hepatomegaly. No abdominal masses felt. No tenderness.  No edema to lower extremities.       Assessment & Plan:  Constipation. Am going to try her on Benefiber daily and see how she does.  GERD: Will start her back on the Prilosec and see how she does. OV in 4 weeks.

## 2015-03-25 ENCOUNTER — Telehealth: Payer: Self-pay | Admitting: Cardiovascular Disease

## 2015-03-25 NOTE — Telephone Encounter (Signed)
Normal nuclear study results called to patient.  Voiced understanding.

## 2015-03-25 NOTE — Telephone Encounter (Signed)
Amy [name] called in stating that the pt had a stress test on 5/17 and she would like to know if those results are available now. Please call  Thanks

## 2015-04-23 ENCOUNTER — Encounter (INDEPENDENT_AMBULATORY_CARE_PROVIDER_SITE_OTHER): Payer: Self-pay | Admitting: Internal Medicine

## 2015-04-23 ENCOUNTER — Ambulatory Visit (INDEPENDENT_AMBULATORY_CARE_PROVIDER_SITE_OTHER): Payer: Medicare Other | Admitting: Internal Medicine

## 2015-04-23 VITALS — BP 132/58 | HR 80 | Temp 97.6°F | Ht 65.5 in | Wt 154.7 lb

## 2015-04-23 DIAGNOSIS — I2510 Atherosclerotic heart disease of native coronary artery without angina pectoris: Secondary | ICD-10-CM | POA: Diagnosis not present

## 2015-04-23 DIAGNOSIS — K5902 Outlet dysfunction constipation: Secondary | ICD-10-CM

## 2015-04-23 DIAGNOSIS — K219 Gastro-esophageal reflux disease without esophagitis: Secondary | ICD-10-CM | POA: Diagnosis not present

## 2015-04-23 NOTE — Patient Instructions (Signed)
Continue present medications. OV 6 months. 

## 2015-04-23 NOTE — Progress Notes (Signed)
Subjective:    Patient ID: Amy [name], female    DOB: 1921-11-06, 81 y.o.   MRN: [id number]  HPI here today for f/u of her constipation. She was last seen in May.  She was started on Benefiber.  Her GERD is controlled with Omeprazole. She has only had 3 "spells" with acid reflux. She does have some burping. Appetite is good. No weight loss. She says her BMs are now normal. She did not take the Benefiber.  She tries to exercise on the bike but she says her hips bother her.  Hx of hip fx in the past. Hx of esophageal perforation during intubation in 2013 for a hip fx.  Caregiver in room with patient.    08/10/2012 CT soft tissue of the neck: Esophageal perforation: 1. Esophageal perforation. The perforation appears to be at the  level of the cricoid cartilage  2. Extensive retropharyngeal gas with perforation posterior to the  carotid space on the right and surrounding the thyroid gland and  larynx.  3. Medial deviation of the right internal carotid artery within  the retropharyngeal space is surrounded by gas.  4. Atherosclerosis.  5. Moderate spondylosis of the cervical spine.  08/20/2012 DG Esophogram:  IMPRESSION:  1. Study is positive for persistent esophageal perforation, as  above.       Review of Systems Past Medical History  Diagnosis Date  . Hypertension   . Hypercholesteremia   . Myocardial infarction     Inferior STEMI 07/2008 - PCI of prox RCA followed byu CABG x 3 in 9/'09  . Pneumonia   . Anxiety   . CAD in native artery     s/p CABG x 3; Last Myoview in 07/2009 - non-ischemic; Echo 03/2012  Aortic Sclerosis with normal EF.  Marland Kitchen Asthma   . Pacemaker   . GERD (gastroesophageal reflux disease)   . Arthritis   . Status post hip hemiarthroplasty left hip by Dr Ronnie Derby 08/08/2012  . PAF (paroxysmal atrial fibrillation)   . CKD (chronic kidney disease), stage III   . DVT (deep vein thrombosis) in pregnancy   . Esophageal perforation     9/13  .  Mediastinitis     s/p drainage  . Hypothyroidism   . Shortness of breath   . Fall 07/21/2014    FALL WITH INJURY  . Fracture of head of humerus   . Fracture of hip 07/21/2014    LEFT  . SSS (sick sinus syndrome) 11/26/2014  . Paroxysmal atrial flutter 11/26/2014    Past Surgical History  Procedure Laterality Date  . Coronary artery bypass graft  2009    LIMA-LAD, SVG-RI, SVG-stented RCA  . Total hip arthroplasty    . Pacemaker insertion  01/06/2009    Medtronic  . Wrist fracture surgery  07/2012    left intra articular  with carpel tunnel  . Carpal tunnel release  08/08/2012    Procedure: CARPAL TUNNEL RELEASE;  Surgeon: Schuyler Amor, MD;  Location: [place];  Service: Orthopedics;  Laterality: Left;  . Hip arthroplasty  08/08/2012    Procedure: ARTHROPLASTY BIPOLAR HIP;  Surgeon: Rudean Haskell, MD;  Location: [place];  Service: Orthopedics;  Laterality: Left;  [place]   . Esophagogastroduodenoscopy  08/10/2012    Procedure: ESOPHAGOGASTRODUODENOSCOPY (EGD);  Surgeon: Beryle Beams, MD;  Location[facility] ENDOSCOPY;  Service: Endoscopy;  Laterality: N/A;  . Esophagoscopy  08/10/2012    Procedure: ESOPHAGOSCOPY;  Surgeon: [name], MD;  Location: [place];  Service: ENT;  Laterality: N/A;  . Gastrostomy  08/16/2012    Procedure: GASTROSTOMY;  Surgeon: Zenovia Jarred, MD;  Location: [place];  Service: General;  Laterality: N/A;  . Joint replacement    . Nm myocar perf wall motion  08/14/2009    Normal  . Left heart catheterization with coronary angiogram N/A 04/16/2014    Procedure: LEFT HEART CATHETERIZATION WITH CORONARY ANGIOGRAM;  Surgeon: Wellington Hampshire, MD;  Location: [place] CATH LAB;  Service: Cardiovascular;  Laterality: N/A;    Allergies  Allergen Reactions  . Colestipol     Muscle aches  . Hydrocodone-Acetaminophen Itching    Tolerates tylenol  . Niacin And Related   . Statins     Muscle aches on all she has tried.  She recalls Lipitor, Crestor, and Livalo but thinks  there were others  . Zetia [Ezetimibe]     Muscle aches    Current Outpatient Prescriptions on File Prior to Visit  Medication Sig Dispense Refill  . albuterol (PROVENTIL HFA;VENTOLIN HFA) 108 (90 BASE) MCG/ACT inhaler Inhale 2 puffs into the lungs every 4 (four) hours as needed for wheezing or shortness of breath. 1 Inhaler 1  . aspirin EC 81 MG tablet Take 81 mg by mouth daily.    Marland Kitchen atenolol (TENORMIN) 25 MG tablet Take 0.5 tablets (12.5 mg total) by mouth daily. 45 tablet 3  . cycloSPORINE (RESTASIS) 0.05 % ophthalmic emulsion Place 1 drop into both eyes 2 (two) times daily.    Marland Kitchen levothyroxine (SYNTHROID, LEVOTHROID) 75 MCG tablet Take 75 mcg by mouth daily before breakfast.    . losartan (COZAAR) 100 MG tablet Take 1 tablet (100 mg total) by mouth daily. 90 tablet 3  . nitroGLYCERIN (NITROSTAT) 0.4 MG SL tablet Place 1 tablet (0.4 mg total) under the tongue every 5 (five) minutes x 3 doses as needed for chest pain. 25 tablet 12  . omeprazole (PRILOSEC) 20 MG capsule Take 1 capsule (20 mg total) by mouth daily. 90 capsule 3  . ondansetron (ZOFRAN) 4 MG tablet Take 4 mg by mouth every 6 (six) hours as needed for nausea or vomiting.     No current facility-administered medications on file prior to visit.        Objective:   Physical ExamBlood pressure 132/58, pulse 80, temperature 97.6 F (36.4 C), height 5' 5.5" (1.664 m), weight 154 lb 11.2 oz (70.171 kg). Alert and oriented. Skin warm and dry. Oral mucosa is moist.   . Sclera anicteric, conjunctivae is pink. Thyroid not enlarged. No cervical lymphadenopathy. Lungs clear. Heart regular rate and rhythm.  Abdomen is soft. Bowel sounds are positive. No hepatomegaly. No abdominal masses felt. No tenderness.  Walks with a walker.          Assessment & Plan:  Jerrye Bushy controlled at this time with Omeprazole.  Constipation: no problems. She is eating fiber and trying to eat health.

## 2015-05-02 ENCOUNTER — Emergency Department (HOSPITAL_COMMUNITY)
Admission: EM | Admit: 2015-05-02 | Discharge: 2015-05-02 | Disposition: A | Discharge status: Home / Self Care | Payer: Medicare Other | Attending: Emergency Medicine | Admitting: Emergency Medicine

## 2015-05-02 ENCOUNTER — Encounter (HOSPITAL_COMMUNITY): Payer: Self-pay | Admitting: Emergency Medicine

## 2015-05-02 ENCOUNTER — Emergency Department (HOSPITAL_COMMUNITY): Payer: Medicare Other

## 2015-05-02 DIAGNOSIS — J209 Acute bronchitis, unspecified: Secondary | ICD-10-CM

## 2015-05-02 DIAGNOSIS — E039 Hypothyroidism, unspecified: Secondary | ICD-10-CM | POA: Insufficient documentation

## 2015-05-02 DIAGNOSIS — K219 Gastro-esophageal reflux disease without esophagitis: Secondary | ICD-10-CM | POA: Insufficient documentation

## 2015-05-02 DIAGNOSIS — R05 Cough: Secondary | ICD-10-CM | POA: Diagnosis present

## 2015-05-02 DIAGNOSIS — Z9181 History of falling: Secondary | ICD-10-CM | POA: Diagnosis not present

## 2015-05-02 DIAGNOSIS — I129 Hypertensive chronic kidney disease with stage 1 through stage 4 chronic kidney disease, or unspecified chronic kidney disease: Secondary | ICD-10-CM | POA: Insufficient documentation

## 2015-05-02 DIAGNOSIS — Z950 Presence of cardiac pacemaker: Secondary | ICD-10-CM | POA: Insufficient documentation

## 2015-05-02 DIAGNOSIS — Z7982 Long term (current) use of aspirin: Secondary | ICD-10-CM | POA: Insufficient documentation

## 2015-05-02 DIAGNOSIS — I2510 Atherosclerotic heart disease of native coronary artery without angina pectoris: Secondary | ICD-10-CM | POA: Diagnosis not present

## 2015-05-02 DIAGNOSIS — Z8781 Personal history of (healed) traumatic fracture: Secondary | ICD-10-CM | POA: Insufficient documentation

## 2015-05-02 DIAGNOSIS — I252 Old myocardial infarction: Secondary | ICD-10-CM | POA: Insufficient documentation

## 2015-05-02 DIAGNOSIS — Z8701 Personal history of pneumonia (recurrent): Secondary | ICD-10-CM | POA: Insufficient documentation

## 2015-05-02 DIAGNOSIS — F419 Anxiety disorder, unspecified: Secondary | ICD-10-CM | POA: Diagnosis not present

## 2015-05-02 DIAGNOSIS — Z9889 Other specified postprocedural states: Secondary | ICD-10-CM | POA: Diagnosis not present

## 2015-05-02 DIAGNOSIS — M1990 Unspecified osteoarthritis, unspecified site: Secondary | ICD-10-CM | POA: Insufficient documentation

## 2015-05-02 DIAGNOSIS — Z79899 Other long term (current) drug therapy: Secondary | ICD-10-CM | POA: Diagnosis not present

## 2015-05-02 DIAGNOSIS — Z951 Presence of aortocoronary bypass graft: Secondary | ICD-10-CM | POA: Insufficient documentation

## 2015-05-02 DIAGNOSIS — J45901 Unspecified asthma with (acute) exacerbation: Secondary | ICD-10-CM | POA: Diagnosis not present

## 2015-05-02 DIAGNOSIS — N183 Chronic kidney disease, stage 3 (moderate): Secondary | ICD-10-CM | POA: Diagnosis not present

## 2015-05-02 DIAGNOSIS — R059 Cough, unspecified: Secondary | ICD-10-CM

## 2015-05-02 DIAGNOSIS — J449 Chronic obstructive pulmonary disease, unspecified: Secondary | ICD-10-CM | POA: Diagnosis not present

## 2015-05-02 DIAGNOSIS — Z8639 Personal history of other endocrine, nutritional and metabolic disease: Secondary | ICD-10-CM | POA: Insufficient documentation

## 2015-05-02 DIAGNOSIS — R509 Fever, unspecified: Secondary | ICD-10-CM | POA: Diagnosis not present

## 2015-05-02 LAB — CBC WITH DIFFERENTIAL/PLATELET
BASOS PCT: 0 % (ref 0–1)
Basophils Absolute: 0 10*3/uL (ref 0.0–0.1)
Eosinophils Absolute: 0 10*3/uL (ref 0.0–0.7)
Eosinophils Relative: 0 % (ref 0–5)
HEMATOCRIT: 40.9 % (ref 36.0–46.0)
HEMOGLOBIN: 13.5 g/dL (ref 12.0–15.0)
LYMPHS ABS: 1.7 10*3/uL (ref 0.7–4.0)
LYMPHS PCT: 9 % — AB (ref 12–46)
MCH: 30.1 pg (ref 26.0–34.0)
MCHC: 33 g/dL (ref 30.0–36.0)
MCV: 91.1 fL (ref 78.0–100.0)
Monocytes Absolute: 1.2 10*3/uL — ABNORMAL HIGH (ref 0.1–1.0)
Monocytes Relative: 6 % (ref 3–12)
NEUTROS ABS: 15.9 10*3/uL — AB (ref 1.7–7.7)
Neutrophils Relative %: 85 % — ABNORMAL HIGH (ref 43–77)
PLATELETS: 187 10*3/uL (ref 150–400)
RBC: 4.49 MIL/uL (ref 3.87–5.11)
RDW: 13.2 % (ref 11.5–15.5)
WBC: 18.8 10*3/uL — AB (ref 4.0–10.5)

## 2015-05-02 LAB — COMPREHENSIVE METABOLIC PANEL
ALK PHOS: 105 U/L (ref 38–126)
ALT: 21 U/L (ref 14–54)
AST: 37 U/L (ref 15–41)
Albumin: 3.7 g/dL (ref 3.5–5.0)
Anion gap: 9 (ref 5–15)
BUN: 20 mg/dL (ref 6–20)
CHLORIDE: 100 mmol/L — AB (ref 101–111)
CO2: 29 mmol/L (ref 22–32)
Calcium: 9.4 mg/dL (ref 8.9–10.3)
Creatinine, Ser: 1.24 mg/dL — ABNORMAL HIGH (ref 0.44–1.00)
GFR calc Af Amer: 43 mL/min — ABNORMAL LOW (ref >60)
GFR, EST NON AFRICAN AMERICAN: 37 mL/min — AB (ref >60)
Glucose, Bld: 122 mg/dL — ABNORMAL HIGH (ref 65–99)
Potassium: 4.2 mmol/L (ref 3.5–5.1)
SODIUM: 138 mmol/L (ref 135–145)
Total Bilirubin: 1.2 mg/dL (ref 0.3–1.2)
Total Protein: 7.2 g/dL (ref 6.5–8.1)

## 2015-05-02 MED ORDER — DOXYCYCLINE HYCLATE 100 MG PO TABS
100.0000 mg | ORAL_TABLET | Freq: Once | ORAL | Status: AC
  Administered 2015-05-02: 100 mg via ORAL
  Filled 2015-05-02: qty 1

## 2015-05-02 MED ORDER — DOXYCYCLINE HYCLATE 100 MG PO CAPS
100.0000 mg | ORAL_CAPSULE | Freq: Two times a day (BID) | ORAL | Status: DC
Start: 2015-05-02 — End: 2015-06-02

## 2015-05-02 MED ORDER — FLUTICASONE-SALMETEROL 115-21 MCG/ACT IN AERO: 2.0000 | INHALATION_SPRAY | Freq: Two times a day (BID) | RESPIRATORY_TRACT | Status: DC

## 2015-05-02 MED ORDER — ALBUTEROL SULFATE HFA 108 (90 BASE) MCG/ACT IN AERS
2.0000 | INHALATION_SPRAY | RESPIRATORY_TRACT | Status: DC | PRN
  Administered 2015-05-02: 2 via RESPIRATORY_TRACT
  Filled 2015-05-02: qty 6.7

## 2015-05-02 MED ORDER — IPRATROPIUM-ALBUTEROL 0.5-2.5 (3) MG/3ML IN SOLN
3.0000 mL | Freq: Once | RESPIRATORY_TRACT | Status: AC
  Administered 2015-05-02: 3 mL via RESPIRATORY_TRACT
  Filled 2015-05-02: qty 3

## 2015-05-02 MED ORDER — AEROCHAMBER Z-STAT PLUS/MEDIUM MISC
1.0000 | Freq: Once | Status: AC
  Administered 2015-05-02: 1
  Filled 2015-05-02: qty 1

## 2015-05-02 MED ORDER — PREDNISONE 20 MG PO TABS: 20.0000 mg | ORAL_TABLET | Freq: Two times a day (BID) | ORAL | Status: DC

## 2015-05-02 NOTE — ED Provider Notes (Signed)
CSN: [id number]     Arrival date & time 05/02/15  1245 History   First MD Initiated Contact with Patient 05/02/15 1301     Chief Complaint  Patient presents with  . Cough  . Fever     (Consider location/radiation/quality/duration/timing/severity/associated sxs/prior Treatment) HPI   Amy [name] is a 81 y.o. female who presents here for evaluation of cough, nonproductive, chest tightness, nausea and vomiting, since yesterday. States that she had a fever yesterday but not today. She has had decreased appetite for a couple of days. She continues to be low to walk. She is supposed to be on chronic therapy with Advair but stopped it several months ago. She does not smoke. There are no other known modifying factors.  Past Medical History  Diagnosis Date  . Hypertension   . Hypercholesteremia   . Myocardial infarction     Inferior STEMI 07/2008 - PCI of prox RCA followed byu CABG x 3 in 9/'09  . Pneumonia   . Anxiety   . CAD in native artery     s/p CABG x 3; Last Myoview in 07/2009 - non-ischemic; Echo 03/2012  Aortic Sclerosis with normal EF.  Marland Kitchen Asthma   . Pacemaker   . GERD (gastroesophageal reflux disease)   . Arthritis   . Status post hip hemiarthroplasty left hip by Dr Ronnie Derby 08/08/2012  . PAF (paroxysmal atrial fibrillation)   . CKD (chronic kidney disease), stage III   . DVT (deep vein thrombosis) in pregnancy   . Esophageal perforation     9/13  . Mediastinitis     s/p drainage  . Hypothyroidism   . Shortness of breath   . Fall 07/21/2014    FALL WITH INJURY  . Fracture of head of humerus   . Fracture of hip 07/21/2014    LEFT  . SSS (sick sinus syndrome) 11/26/2014  . Paroxysmal atrial flutter 11/26/2014   Past Surgical History  Procedure Laterality Date  . Coronary artery bypass graft  2009    LIMA-LAD, SVG-RI, SVG-stented RCA  . Total hip arthroplasty    . Pacemaker insertion  01/06/2009    Medtronic  . Wrist fracture surgery  07/2012    left intra articular  with  carpel tunnel  . Carpal tunnel release  08/08/2012    Procedure: CARPAL TUNNEL RELEASE;  Surgeon: Schuyler Amor, MD;  Location: [place];  Service: Orthopedics;  Laterality: Left;  . Hip arthroplasty  08/08/2012    Procedure: ARTHROPLASTY BIPOLAR HIP;  Surgeon: Rudean Haskell, MD;  Location: [facility];  Service: Orthopedics;  Laterality: Left;  [place]   . Esophagogastroduodenoscopy  08/10/2012    Procedure: ESOPHAGOGASTRODUODENOSCOPY (EGD);  Surgeon: Beryle Beams, MD;  Location: [facility] ENDOSCOPY;  Service: Endoscopy;  Laterality: N/A;  . Esophagoscopy  08/10/2012    Procedure: ESOPHAGOSCOPY;  Surgeon: [name], MD;  Location: [place];  Service: ENT;  Laterality: N/A;  . Gastrostomy  08/16/2012    Procedure: GASTROSTOMY;  Surgeon: Zenovia Jarred, MD;  Location: [place];  Service: General;  Laterality: N/A;  . Joint replacement    . Nm myocar perf wall motion  08/14/2009    Normal  . Left heart catheterization with coronary angiogram N/A 04/16/2014    Procedure: LEFT HEART CATHETERIZATION WITH CORONARY ANGIOGRAM;  Surgeon: Wellington Hampshire, MD;  Location: [place] CATH LAB;  Service: Cardiovascular;  Laterality: N/A;   Family History  Problem Relation Age of Onset  . Stroke Father    History  Substance Use Topics  . Smoking status: Never Smoker   . Smokeless tobacco: Never Used  . Alcohol Use: No   OB History    No data available     Review of Systems  All other systems reviewed and are negative.     Allergies  Colestipol; Hydrocodone-acetaminophen; Niacin and related; Statins; and Zetia  Home Medications   Prior to Admission medications   Medication Sig Start Date End Date Taking? Authorizing Provider  albuterol (PROVENTIL HFA;VENTOLIN HFA) 108 (90 BASE) MCG/ACT inhaler Inhale 2 puffs into the lungs every 4 (four) hours as needed for wheezing or shortness of breath. 05/27/14  Yes Mihai Croitoru, MD  amoxicillin (AMOXIL) 500 MG capsule Take 500 mg by mouth daily as needed (dental  appointment).    Yes Historical Provider, MD  aspirin EC 81 MG tablet Take 81 mg by mouth daily.   Yes Historical Provider, MD  atenolol (TENORMIN) 25 MG tablet Take 0.5 tablets (12.5 mg total) by mouth daily. 12/01/14  Yes Mihai Croitoru, MD  cycloSPORINE (RESTASIS) 0.05 % ophthalmic emulsion Place 1 drop into both eyes 2 (two) times daily.   Yes Historical Provider, MD  levothyroxine (SYNTHROID, LEVOTHROID) 75 MCG tablet Take 75 mcg by mouth daily before breakfast.   Yes Historical Provider, MD  losartan (COZAAR) 100 MG tablet Take 1 tablet (100 mg total) by mouth daily. 03/12/15  Yes Mihai Croitoru, MD  nitroGLYCERIN (NITROSTAT) 0.4 MG SL tablet Place 1 tablet (0.4 mg total) under the tongue every 5 (five) minutes x 3 doses as needed for chest pain. 04/17/14  Yes Rhonda G Barrett, PA-C  omeprazole (PRILOSEC) 20 MG capsule Take 1 capsule (20 mg total) by mouth daily. 03/24/15  Yes Butch Penny, NP  ondansetron (ZOFRAN) 4 MG tablet Take 4 mg by mouth every 6 (six) hours as needed for nausea or vomiting.   Yes Historical Provider, MD   BP 112/53 mmHg  Pulse 70  Temp(Src) 98 F (36.7 C) (Oral)  Resp 18  Ht 5\' 5"  (1.651 m)  Wt 145 lb (65.772 kg)  BMI 24.13 kg/m2  SpO2 93% Physical Exam  Constitutional: She is oriented to person, place, and time. She appears well-developed.  Elderly, frail  HENT:  Head: Normocephalic and atraumatic.  Right Ear: External ear normal.  Left Ear: External ear normal.  Eyes: Conjunctivae and EOM are normal. Pupils are equal, round, and reactive to light.  Neck: Normal range of motion and phonation normal. Neck supple.  Cardiovascular: Normal rate, regular rhythm and normal heart sounds.   Pulmonary/Chest: Effort normal. No respiratory distress. She exhibits no tenderness and no bony tenderness.  Scattered expiratory wheezes and rhonchi. Somewhat diminished air movement bilaterally.  Abdominal: Soft. There is no tenderness.  Musculoskeletal: Normal range of  motion. She exhibits no edema or tenderness.  Neurological: She is alert and oriented to person, place, and time. No cranial nerve deficit or sensory deficit. She exhibits normal muscle tone. Coordination normal.  Skin: Skin is warm, dry and intact.  Psychiatric: She has a normal mood and affect. Her behavior is normal. Judgment and thought content normal.  Nursing note and vitals reviewed.   ED Course  Procedures (including critical care time)  Clinical signs and symptoms of bronchitis without evidence for sepsis, severe infection or hemodynamic instability.  Medications  ipratropium-albuterol (DUONEB) 0.5-2.5 (3) MG/3ML nebulizer solution 3 mL (not administered)  doxycycline (VIBRA-TABS) tablet 100 mg (100 mg Oral Given 05/02/15 1449)    Patient Vitals for the  past 24 hrs:  BP Temp Temp src Pulse Resp SpO2 Height Weight  05/02/15 1430 (!) 112/53 mmHg - - 70 - 93 % - -  05/02/15 1400 (!) 102/52 mmHg - - 70 - 91 % - -  05/02/15 1309 121/66 mmHg - - 72 - 94 % - -  05/02/15 1255 109/55 mmHg 98 F (36.7 C) Oral 70 18 93 % 5\' 5"  (1.651 m) 145 lb (65.772 kg)    3:32 PM Reevaluation with update and discussion. After initial assessment and treatment, an updated evaluation reveals she states her chest tightness has improved. Lung exam is essentially unchanged. Oxygen saturation is 95% on room air. She states that she does not know if she has an albuterol or Advair inhaler at home. Findings discussed with patient and daughter, all questions answered. Bradey Luzier L    Labs Review Labs Reviewed  COMPREHENSIVE METABOLIC PANEL - Abnormal; Notable for the following:    Chloride 100 (*)    Glucose, Bld 122 (*)    Creatinine, Ser 1.24 (*)    GFR calc non Af Amer 37 (*)    GFR calc Af Amer 43 (*)    All other components within normal limits  CBC WITH DIFFERENTIAL/PLATELET - Abnormal; Notable for the following:    WBC 18.8 (*)    Neutrophils Relative % 85 (*)    Neutro Abs 15.9 (*)     Lymphocytes Relative 9 (*)    Monocytes Absolute 1.2 (*)    All other components within normal limits    Imaging Review Dg Chest 2 View  05/02/2015   CLINICAL DATA:  Cough, sore throat, fever since last night.  EXAM: CHEST  2 VIEW  COMPARISON:  07/31/2014  FINDINGS: Left pacer remains in place, unchanged. Prior CABG. Heart is borderline in size. Mild hyperinflation of the lungs. Biapical scarring. Scarring at the left base. No acute airspace opacities or effusions. Aortic calcifications without visible aneurysm. No acute bony abnormality.  IMPRESSION: COPD/chronic changes.  Mild cardiomegaly.  No active disease.   Electronically Signed   By: Rolm Baptise M.D.   On: 05/02/2015 14:07     EKG Interpretation None      MDM   Final diagnoses:  Cough  Acute bronchitis, unspecified organism    Evaluation consistent with acute bronchitis. Doubt pneumonia, PE or ACS.   Nursing Notes Reviewed/ Care Coordinated Applicable Imaging Reviewed Interpretation of Laboratory Data incorporated into ED treatment  The patient appears reasonably screened and/or stabilized for discharge and I doubt any other medical condition or other [facility] requiring further screening, evaluation, or treatment in the ED at this time prior to discharge.  Plan: Home Medications- Doxy. Prednisone, Advair; Home Treatments- rest; return here if the recommended treatment, does not improve the symptoms; Recommended follow up- PCP 5 days and prn    Daleen Bo, MD 05/02/15 1544

## 2015-05-02 NOTE — ED Notes (Signed)
MD Wentz at bedside.  

## 2015-05-02 NOTE — ED Notes (Signed)
Pt states ate piece of corn bread and choked on it , since then has been having sore throat, cough, and fever (101.8) last night . However, has also been having some slight increase in cough and tickling throat  Past couple of weeks -

## 2015-05-02 NOTE — Discharge Instructions (Signed)
Start all of the prescriptions today. Use the albuterol inhaler 2 puffs every 3-4 hours as needed for cough or trouble breathing.    Acute Bronchitis Bronchitis is inflammation of the airways that extend from the windpipe into the lungs (bronchi). The inflammation often causes mucus to develop. This leads to a cough, which is the most common symptom of bronchitis.  In acute bronchitis, the condition usually develops suddenly and goes away over time, usually in a couple weeks. Smoking, allergies, and asthma can make bronchitis worse. Repeated episodes of bronchitis may cause further lung problems.  CAUSES Acute bronchitis is most often caused by the same virus that causes a cold. The virus can spread from person to person (contagious) through coughing, sneezing, and touching contaminated objects. SIGNS AND SYMPTOMS   Cough.   Fever.   Coughing up mucus.   Body aches.   Chest congestion.   Chills.   Shortness of breath.   Sore throat.  DIAGNOSIS  Acute bronchitis is usually diagnosed through a physical exam. Your health care provider will also ask you questions about your medical history. Tests, such as chest X-rays, are sometimes done to rule out other conditions.  TREATMENT  Acute bronchitis usually goes away in a couple weeks. Oftentimes, no medical treatment is necessary. Medicines are sometimes given for relief of fever or cough. Antibiotic medicines are usually not needed but may be prescribed in certain situations. In some cases, an inhaler may be recommended to help reduce shortness of breath and control the cough. A cool mist vaporizer may also be used to help thin bronchial secretions and make it easier to clear the chest.  HOME CARE INSTRUCTIONS  Get plenty of rest.   Drink enough fluids to keep your urine clear or pale yellow (unless you have a medical condition that requires fluid restriction). Increasing fluids may help thin your respiratory secretions (sputum)  and reduce chest congestion, and it will prevent dehydration.   Take medicines only as directed by your health care provider.  If you were prescribed an antibiotic medicine, finish it all even if you start to feel better.  Avoid smoking and secondhand smoke. Exposure to cigarette smoke or irritating chemicals will make bronchitis worse. If you are a smoker, consider using nicotine gum or skin patches to help control withdrawal symptoms. Quitting smoking will help your lungs heal faster.   Reduce the chances of another bout of acute bronchitis by washing your hands frequently, avoiding people with cold symptoms, and trying not to touch your hands to your mouth, nose, or eyes.   Keep all follow-up visits as directed by your health care provider.  SEEK MEDICAL CARE IF: Your symptoms do not improve after 1 week of treatment.  SEEK IMMEDIATE MEDICAL CARE IF:  You develop an increased fever or chills.   You have chest pain.   You have severe shortness of breath.  You have bloody sputum.   You develop dehydration.  You faint or repeatedly feel like you are going to pass out.  You develop repeated vomiting.  You develop a severe headache. MAKE SURE YOU:   Understand these instructions.  Will watch your condition.  Will get help right away if you are not doing well or get worse. Document Released: 11/24/2004 Document Revised: 03/03/2014 Document Reviewed: 04/09/2013 [facility] Patient Information 2015 [place], [place]. This information is not intended to replace advice given to you by your health care provider. Make sure you discuss any questions you have with your health care  provider.

## 2015-05-02 NOTE — ED Notes (Signed)
Respiratory at bedside.

## 2015-05-02 NOTE — ED Notes (Signed)
Respiratory notified of pending duoneb treatment.

## 2015-05-09 ENCOUNTER — Observation Stay (HOSPITAL_COMMUNITY)
Admission: EM | Admit: 2015-05-09 | Discharge: 2015-05-10 | Disposition: A | Discharge status: Home / Self Care | Payer: Medicare Other | Attending: Family Medicine | Admitting: Family Medicine

## 2015-05-09 ENCOUNTER — Emergency Department (HOSPITAL_COMMUNITY): Payer: Medicare Other

## 2015-05-09 ENCOUNTER — Encounter (HOSPITAL_COMMUNITY): Payer: Self-pay | Admitting: *Deleted

## 2015-05-09 DIAGNOSIS — N189 Chronic kidney disease, unspecified: Secondary | ICD-10-CM | POA: Diagnosis not present

## 2015-05-09 DIAGNOSIS — N183 Chronic kidney disease, stage 3 unspecified: Secondary | ICD-10-CM | POA: Diagnosis present

## 2015-05-09 DIAGNOSIS — Z8701 Personal history of pneumonia (recurrent): Secondary | ICD-10-CM | POA: Insufficient documentation

## 2015-05-09 DIAGNOSIS — E039 Hypothyroidism, unspecified: Secondary | ICD-10-CM | POA: Insufficient documentation

## 2015-05-09 DIAGNOSIS — Z79899 Other long term (current) drug therapy: Secondary | ICD-10-CM | POA: Diagnosis not present

## 2015-05-09 DIAGNOSIS — I2510 Atherosclerotic heart disease of native coronary artery without angina pectoris: Secondary | ICD-10-CM | POA: Diagnosis present

## 2015-05-09 DIAGNOSIS — Z950 Presence of cardiac pacemaker: Secondary | ICD-10-CM | POA: Diagnosis not present

## 2015-05-09 DIAGNOSIS — I480 Paroxysmal atrial fibrillation: Secondary | ICD-10-CM | POA: Insufficient documentation

## 2015-05-09 DIAGNOSIS — I495 Sick sinus syndrome: Secondary | ICD-10-CM | POA: Diagnosis not present

## 2015-05-09 DIAGNOSIS — Z8781 Personal history of (healed) traumatic fracture: Secondary | ICD-10-CM | POA: Insufficient documentation

## 2015-05-09 DIAGNOSIS — K219 Gastro-esophageal reflux disease without esophagitis: Secondary | ICD-10-CM | POA: Diagnosis present

## 2015-05-09 DIAGNOSIS — M1990 Unspecified osteoarthritis, unspecified site: Secondary | ICD-10-CM | POA: Diagnosis not present

## 2015-05-09 DIAGNOSIS — E780 Pure hypercholesterolemia: Secondary | ICD-10-CM | POA: Insufficient documentation

## 2015-05-09 DIAGNOSIS — J45909 Unspecified asthma, uncomplicated: Secondary | ICD-10-CM | POA: Insufficient documentation

## 2015-05-09 DIAGNOSIS — Z7982 Long term (current) use of aspirin: Secondary | ICD-10-CM | POA: Insufficient documentation

## 2015-05-09 DIAGNOSIS — J985 Diseases of mediastinum, not elsewhere classified: Secondary | ICD-10-CM | POA: Insufficient documentation

## 2015-05-09 DIAGNOSIS — I4892 Unspecified atrial flutter: Secondary | ICD-10-CM | POA: Insufficient documentation

## 2015-05-09 DIAGNOSIS — I252 Old myocardial infarction: Secondary | ICD-10-CM | POA: Diagnosis not present

## 2015-05-09 DIAGNOSIS — I129 Hypertensive chronic kidney disease with stage 1 through stage 4 chronic kidney disease, or unspecified chronic kidney disease: Secondary | ICD-10-CM | POA: Insufficient documentation

## 2015-05-09 DIAGNOSIS — R079 Chest pain, unspecified: Principal | ICD-10-CM

## 2015-05-09 DIAGNOSIS — F419 Anxiety disorder, unspecified: Secondary | ICD-10-CM | POA: Insufficient documentation

## 2015-05-09 DIAGNOSIS — R0789 Other chest pain: Secondary | ICD-10-CM | POA: Diagnosis not present

## 2015-05-09 DIAGNOSIS — N1832 Chronic kidney disease, stage 3b: Secondary | ICD-10-CM | POA: Diagnosis present

## 2015-05-09 LAB — CBC
HEMATOCRIT: 40.5 % (ref 36.0–46.0)
HEMOGLOBIN: 13.4 g/dL (ref 12.0–15.0)
MCH: 29.9 pg (ref 26.0–34.0)
MCHC: 33.1 g/dL (ref 30.0–36.0)
MCV: 90.4 fL (ref 78.0–100.0)
Platelets: 214 10*3/uL (ref 150–400)
RBC: 4.48 MIL/uL (ref 3.87–5.11)
RDW: 13.5 % (ref 11.5–15.5)
WBC: 9.7 10*3/uL (ref 4.0–10.5)

## 2015-05-09 LAB — BASIC METABOLIC PANEL
Anion gap: 10 (ref 5–15)
BUN: 27 mg/dL — ABNORMAL HIGH (ref 6–20)
CALCIUM: 9 mg/dL (ref 8.9–10.3)
CO2: 28 mmol/L (ref 22–32)
Chloride: 102 mmol/L (ref 101–111)
Creatinine, Ser: 1.37 mg/dL — ABNORMAL HIGH (ref 0.44–1.00)
GFR calc Af Amer: 38 mL/min — ABNORMAL LOW (ref >60)
GFR calc non Af Amer: 33 mL/min — ABNORMAL LOW (ref >60)
Glucose, Bld: 79 mg/dL (ref 65–99)
Potassium: 4.3 mmol/L (ref 3.5–5.1)
SODIUM: 140 mmol/L (ref 135–145)

## 2015-05-09 LAB — TROPONIN I

## 2015-05-09 MED ORDER — NITROGLYCERIN 0.4 MG SL SUBL
0.4000 mg | SUBLINGUAL_TABLET | SUBLINGUAL | Status: DC | PRN
  Administered 2015-05-09: 0.4 mg via SUBLINGUAL

## 2015-05-09 MED ORDER — ALBUTEROL SULFATE (2.5 MG/3ML) 0.083% IN NEBU: 2.5000 mg | INHALATION_SOLUTION | RESPIRATORY_TRACT | Status: DC | PRN

## 2015-05-09 MED ORDER — ONDANSETRON HCL 4 MG PO TABS: 4.0000 mg | ORAL_TABLET | Freq: Four times a day (QID) | ORAL | Status: DC | PRN

## 2015-05-09 MED ORDER — LEVOTHYROXINE SODIUM 75 MCG PO TABS
75.0000 ug | ORAL_TABLET | Freq: Every day | ORAL | Status: DC
  Administered 2015-05-10: 75 ug via ORAL
  Filled 2015-05-09: qty 1

## 2015-05-09 MED ORDER — ACETAMINOPHEN 325 MG PO TABS: 650.0000 mg | ORAL_TABLET | Freq: Four times a day (QID) | ORAL | Status: DC | PRN

## 2015-05-09 MED ORDER — LOTEPREDNOL ETABONATE 0.5 % OP GEL
1.0000 [drp] | Freq: Every day | OPHTHALMIC | Status: DC
  Filled 2015-05-09 (×2): qty 10

## 2015-05-09 MED ORDER — DOXYCYCLINE HYCLATE 100 MG PO CAPS
100.0000 mg | ORAL_CAPSULE | Freq: Two times a day (BID) | ORAL | Status: DC
  Filled 2015-05-09 (×4): qty 1

## 2015-05-09 MED ORDER — MOMETASONE FURO-FORMOTEROL FUM 100-5 MCG/ACT IN AERO
INHALATION_SPRAY | RESPIRATORY_TRACT | Status: AC
  Filled 2015-05-09: qty 8.8

## 2015-05-09 MED ORDER — PANTOPRAZOLE SODIUM 40 MG PO TBEC
40.0000 mg | DELAYED_RELEASE_TABLET | Freq: Every day | ORAL | Status: DC
  Administered 2015-05-09 – 2015-05-10 (×2): 40 mg via ORAL
  Filled 2015-05-09 (×2): qty 1

## 2015-05-09 MED ORDER — GI COCKTAIL ~~LOC~~: 30.0000 mL | Freq: Four times a day (QID) | ORAL | Status: DC | PRN

## 2015-05-09 MED ORDER — RISAQUAD PO CAPS
1.0000 | ORAL_CAPSULE | Freq: Two times a day (BID) | ORAL | Status: DC
  Administered 2015-05-09 – 2015-05-10 (×2): 1 via ORAL
  Filled 2015-05-09 (×3): qty 1

## 2015-05-09 MED ORDER — ALBUTEROL SULFATE HFA 108 (90 BASE) MCG/ACT IN AERS
2.0000 | INHALATION_SPRAY | RESPIRATORY_TRACT | Status: DC | PRN
  Filled 2015-05-09: qty 6.7

## 2015-05-09 MED ORDER — LOTEPREDNOL ETABONATE 0.5 % OP SUSP
1.0000 [drp] | Freq: Every day | OPHTHALMIC | Status: DC
  Administered 2015-05-09: 1 [drp] via OPHTHALMIC
  Filled 2015-05-09: qty 5

## 2015-05-09 MED ORDER — NITROGLYCERIN 0.4 MG SL SUBL: 0.4000 mg | SUBLINGUAL_TABLET | SUBLINGUAL | Status: DC | PRN

## 2015-05-09 MED ORDER — LORAZEPAM 1 MG PO TABS
1.0000 mg | ORAL_TABLET | Freq: Every day | ORAL | Status: DC | PRN
  Administered 2015-05-09: 1 mg via ORAL
  Filled 2015-05-09: qty 1

## 2015-05-09 MED ORDER — CYCLOSPORINE 0.05 % OP EMUL
1.0000 [drp] | Freq: Two times a day (BID) | OPHTHALMIC | Status: DC
  Administered 2015-05-09 – 2015-05-10 (×3): 1 [drp] via OPHTHALMIC
  Filled 2015-05-09 (×5): qty 1

## 2015-05-09 MED ORDER — ATENOLOL 25 MG PO TABS
12.5000 mg | ORAL_TABLET | Freq: Every day | ORAL | Status: DC
  Administered 2015-05-09 – 2015-05-10 (×2): 12.5 mg via ORAL
  Filled 2015-05-09 (×2): qty 1

## 2015-05-09 MED ORDER — LOSARTAN POTASSIUM 50 MG PO TABS
100.0000 mg | ORAL_TABLET | Freq: Every day | ORAL | Status: DC
  Administered 2015-05-09 – 2015-05-10 (×2): 100 mg via ORAL
  Filled 2015-05-09 (×2): qty 2

## 2015-05-09 MED ORDER — MOMETASONE FURO-FORMOTEROL FUM 100-5 MCG/ACT IN AERO
2.0000 | INHALATION_SPRAY | Freq: Two times a day (BID) | RESPIRATORY_TRACT | Status: DC
  Administered 2015-05-09 – 2015-05-10 (×2): 2 via RESPIRATORY_TRACT
  Filled 2015-05-09: qty 8.8

## 2015-05-09 MED ORDER — NITROGLYCERIN 0.4 MG SL SUBL
SUBLINGUAL_TABLET | SUBLINGUAL | Status: AC
  Filled 2015-05-09: qty 1

## 2015-05-09 MED ORDER — ASPIRIN EC 81 MG PO TBEC
81.0000 mg | DELAYED_RELEASE_TABLET | Freq: Every day | ORAL | Status: DC
  Administered 2015-05-10: 81 mg via ORAL
  Filled 2015-05-09: qty 1

## 2015-05-09 MED ORDER — DOXYCYCLINE HYCLATE 100 MG PO TABS
100.0000 mg | ORAL_TABLET | Freq: Two times a day (BID) | ORAL | Status: DC
  Administered 2015-05-09 – 2015-05-10 (×2): 100 mg via ORAL
  Filled 2015-05-09 (×2): qty 1

## 2015-05-09 MED ORDER — NYSTATIN 100000 UNIT/ML MT SUSP
5.0000 mL | Freq: Four times a day (QID) | OROMUCOSAL | Status: DC
  Administered 2015-05-09 – 2015-05-10 (×3): 500000 [IU] via ORAL
  Filled 2015-05-09 (×3): qty 5

## 2015-05-09 MED ORDER — CYCLOSPORINE 0.05 % OP EMUL
OPHTHALMIC | Status: AC
  Filled 2015-05-09: qty 1

## 2015-05-09 MED ORDER — ONDANSETRON HCL 4 MG/2ML IJ SOLN: 4.0000 mg | Freq: Four times a day (QID) | INTRAMUSCULAR | Status: DC | PRN

## 2015-05-09 NOTE — ED Notes (Signed)
Pt's family to desk stating that pt was having chest pain again. To bedside where pt attempting to burp. Pt stated that she has indigestion "bad" at times and she's not sure if the pain is from that or her chest. Pt and family requesting she try another Nitro SL. Notified K Sophia, PA-C and new orders given and carried out accordingly.

## 2015-05-09 NOTE — ED Notes (Signed)
Pt given meal tray.

## 2015-05-09 NOTE — H&P (Signed)
History and Physical  Amy [name] [id number] DOB: 1922/03/21 DOA: 05/09/2015  Referring physician: Collier Salina, PA in ED PCP: Purvis Kilts, MD  Dr Croitoru  Chief Complaint: chest pain  HPI:  87yow PMH CAD, CABG presented to ED with c/o chest pain this AM, relieved with NTG. Initial evaluation in ED was reassuring with negative troponins and she was referred for observation.  History of coronary artery disease with STEMI, MI, CABG 2009. Had a nuclear medicine stress test recently in May which was unremarkable.  This AM had episode of chest pain relieved with NTG. No n/v/diaphoresis. No radiation to neck, arm or jaw. Had associated SOB. One episode of CP in ED again relieved with NTG. H/o GERD and belching.  Recently started on an inhaler, prednisone and doxycycline for bronchitis.   In the emergency department afebrile, VSS, no hypoxia. Basic metabolic panel unremarkable, consistent with chronic kidney disease stage III. Troponins 2 negative. CBC unremarkable.   Chest x-ray independently reviewed, no acute process noted. EKG independently reviewed sinus rhythm, no acute changes  Review of Systems:  Negative for fever, visual changes, rash, new muscle aches, dysuria, bleeding, n/v/abdominal pain.  Positive for sore throat since starting inhaler.  Past Medical History  Diagnosis Date  . Hypertension   . Hypercholesteremia   . Myocardial infarction     Inferior STEMI 07/2008 - PCI of prox RCA followed byu CABG x 3 in 9/'09  . Pneumonia   . Anxiety   . CAD in native artery     s/p CABG x 3; Last Myoview in 07/2009 - non-ischemic; Echo 03/2012  Aortic Sclerosis with normal EF.  Marland Kitchen Asthma   . Pacemaker   . GERD (gastroesophageal reflux disease)   . Arthritis   . Status post hip hemiarthroplasty left hip by Dr Ronnie Derby 08/08/2012  . PAF (paroxysmal atrial fibrillation)   . CKD (chronic kidney disease), stage III   . DVT (deep vein thrombosis) in pregnancy   .  Esophageal perforation     9/13  . Mediastinitis     s/p drainage  . Hypothyroidism   . Shortness of breath   . Fall 07/21/2014    FALL WITH INJURY  . Fracture of head of humerus   . Fracture of hip 07/21/2014    LEFT  . SSS (sick sinus syndrome) 11/26/2014  . Paroxysmal atrial flutter 11/26/2014    Past Surgical History  Procedure Laterality Date  . Coronary artery bypass graft  2009    LIMA-LAD, SVG-RI, SVG-stented RCA  . Total hip arthroplasty    . Pacemaker insertion  01/06/2009    Medtronic  . Wrist fracture surgery  07/2012    left intra articular  with carpel tunnel  . Carpal tunnel release  08/08/2012    Procedure: CARPAL TUNNEL RELEASE;  Surgeon: Schuyler Amor, MD;  Location: [place];  Service: Orthopedics;  Laterality: Left;  . Hip arthroplasty  08/08/2012    Procedure: ARTHROPLASTY BIPOLAR HIP;  Surgeon: Rudean Haskell, MD;  Location: [place];  Service: Orthopedics;  Laterality: Left;  [place]   . Esophagogastroduodenoscopy  08/10/2012    Procedure: ESOPHAGOGASTRODUODENOSCOPY (EGD);  Surgeon: Beryle Beams, MD;  Location: [facility] ENDOSCOPY;  Service: Endoscopy;  Laterality: N/A;  . Esophagoscopy  08/10/2012    Procedure: ESOPHAGOSCOPY;  Surgeon: [name], MD;  Location: [place];  Service: ENT;  Laterality: N/A;  . Gastrostomy  08/16/2012    Procedure: GASTROSTOMY;  Surgeon: Zenovia Jarred, MD;  Location:  [facility] OR;  Service: General;  Laterality: N/A;  . Joint replacement    . Nm myocar perf wall motion  08/14/2009    Normal  . Left heart catheterization with coronary angiogram N/A 04/16/2014    Procedure: LEFT HEART CATHETERIZATION WITH CORONARY ANGIOGRAM;  Surgeon: Wellington Hampshire, MD;  Location: [place] CATH LAB;  Service: Cardiovascular;  Laterality: N/A;    Social History:  reports that she has never smoked. She has never used smokeless tobacco. She reports that she does not drink alcohol or use illicit drugs.   Allergies  Allergen Reactions  . Colestipol     Muscle  aches  . Hydrocodone-Acetaminophen Itching    Tolerates tylenol  . Niacin And Related   . Statins     Muscle aches on all she has tried.  She recalls Lipitor, Crestor, and Livalo but thinks there were others  . Zetia [Ezetimibe]     Muscle aches    Family History  Problem Relation Age of Onset  . Stroke Father      Prior to Admission medications   Medication Sig Start Date End Date Taking? Authorizing Provider  acetaminophen (TYLENOL) 500 MG tablet Take 1,000 mg by mouth every 6 (six) hours as needed for moderate pain.   Yes Historical Provider, MD  acidophilus (RISAQUAD) CAPS capsule Take 1 capsule by mouth 2 (two) times daily.   Yes Historical Provider, MD  albuterol (PROVENTIL HFA;VENTOLIN HFA) 108 (90 BASE) MCG/ACT inhaler Inhale 2 puffs into the lungs every 4 (four) hours as needed for wheezing or shortness of breath. 05/27/14  Yes Mihai Croitoru, MD  amoxicillin (AMOXIL) 500 MG capsule Take 500 mg by mouth daily as needed (dental appointment).    Yes Historical Provider, MD  aspirin EC 81 MG tablet Take 81 mg by mouth daily.   Yes Historical Provider, MD  atenolol (TENORMIN) 25 MG tablet Take 0.5 tablets (12.5 mg total) by mouth daily. 12/01/14  Yes Mihai Croitoru, MD  cycloSPORINE (RESTASIS) 0.05 % ophthalmic emulsion Place 1 drop into both eyes 2 (two) times daily.   Yes Historical Provider, MD  doxycycline (VIBRAMYCIN) 100 MG capsule Take 1 capsule (100 mg total) by mouth 2 (two) times daily. 05/02/15  Yes Daleen Bo, MD  fluticasone-salmeterol (ADVAIR HFA) [phone] MCG/ACT inhaler Inhale 2 puffs into the lungs 2 (two) times daily. 05/02/15  Yes Daleen Bo, MD  ibuprofen (ADVIL,MOTRIN) 200 MG tablet Take 400 mg by mouth every 6 (six) hours as needed for moderate pain.   Yes Historical Provider, MD  levothyroxine (SYNTHROID, LEVOTHROID) 75 MCG  tablet Take 75 mcg by mouth daily before breakfast.   Yes Historical Provider, MD  LORazepam (ATIVAN) 1 MG tablet Take 1 mg by mouth daily  as needed for sleep.   Yes Historical Provider, MD  losartan (COZAAR) 100 MG tablet Take 1 tablet (100 mg total) by mouth daily. 03/12/15  Yes Mihai Croitoru, MD  Loteprednol Etabonate (LOTEMAX) 0.5 % GEL Place 1 drop into both eyes at bedtime.   Yes Historical Provider, MD  nitroGLYCERIN (NITROSTAT) 0.4 MG SL tablet Place 1 tablet (0.4 mg total) under the tongue every 5 (five) minutes x 3 doses as needed for chest pain. 04/17/14  Yes Rhonda G Barrett, PA-C  omeprazole (PRILOSEC) 20 MG capsule Take 1 capsule (20 mg total) by mouth daily. 03/24/15  Yes Butch Penny, NP  ondansetron (ZOFRAN) 4 MG tablet Take 4 mg by mouth every 6 (six) hours as needed for nausea or vomiting.  Yes Historical Provider, MD  predniSONE (DELTASONE) 20 MG tablet Take 1 tablet (20 mg total) by mouth 2 (two) times daily. Patient not taking: Reported on 05/09/2015 05/02/15   Daleen Bo, MD   Physical Exam: Filed Vitals:   05/09/15 1130 05/09/15 1145 05/09/15 1200 05/09/15 1230  BP: 152/65  127/65 124/67  Pulse: 69 70 70 69  Temp:      TempSrc:      Resp: 16 17 18 18   Height:      Weight:      SpO2: 95% 93% 94% 93%    General: Examined in ED. Appears calm and comfortable Eyes: PERRL, normal lids, irises  ENT: grossly normal hearing, lips  Neck: no LAD, masses or thyromegaly Cardiovascular: RRR, no m/r/g. No LE edema. Respiratory: CTA bilaterally, no w/r/r. Normal respiratory effort. Abdomen: soft, ntnd Skin: no rash or induration noted Musculoskeletal: grossly normal tone BUE/BLE Psychiatric: grossly normal mood and affect, speech fluent and appropriate Neurologic: grossly non-focal.  Wt Readings from Last 3 Encounters:  05/09/15 65.772 kg (145 lb)  05/02/15 65.772 kg (145 lb)  04/23/15 70.171 kg (154 lb 11.2 oz)    Labs on Admission:  Basic Metabolic Panel:  Recent Labs Lab 05/02/15 1334 05/09/15 0825  NA 138 140  K 4.2 4.3  CL 100* 102  CO2 29 28  GLUCOSE 122* 79  BUN 20 27*  CREATININE  1.24* 1.37*  CALCIUM 9.4 9.0    Liver Function Tests:  Recent Labs Lab 05/02/15 1334  AST 37  ALT 21  ALKPHOS 105  BILITOT 1.2  PROT 7.2  ALBUMIN 3.7    CBC:  Recent Labs Lab 05/02/15 1334 05/09/15 0825  WBC 18.8* 9.7  NEUTROABS 15.9*  --   HGB 13.5 13.4  HCT 40.9 40.5  MCV 91.1 90.4  PLT 187 214    Cardiac Enzymes:  Recent Labs Lab 05/09/15 0825 05/09/15 1131  TROPONINI <0.03 <0.03    Radiological Exams on Admission: Dg Chest 2 View  05/09/2015   CLINICAL DATA:  Patient with chest pain and thrush.  EXAM: CHEST  2 VIEW  COMPARISON:  Chest radiograph 05/02/2015  FINDINGS: Multi lead pacer apparatus overlies the left hemi thorax, leads are stable in position. Stable enlarged cardiac and mediastinal contours status post median sternotomy. Monitoring leads overlie the patient. No consolidative pulmonary opacities. Biapical pleural parenchymal thickening. No pleural effusion or pneumothorax. Mid thoracic spine degenerative changes. Suggestion interval healing displaced left humeral head fracture.  IMPRESSION: No acute cardiopulmonary process.  Mild cardiomegaly.   Electronically Signed   By: Lovey Newcomer M.D.   On: 05/09/2015 09:30    Principal Problem:   Chest pain Active Problems:   CAD, CABG X 3 10/09. Myoview low risk 10/10. EF >55% 2D 6/13.   Chronic renal insufficiency, stage III (moderate), SCr 1.4 Sept 2013   Pacemaker March 2010 (MDT)   GERD (gastroesophageal reflux disease)   Assessment/Plan 1. Chest pain with typical and atypical features. Pain free now. Favor non-cardiac. Has chronic GI symptoms. Under a lot of stress. Sister died a few weeks ago. NM study: 03/17/2015: "Myocardial perfusion is normal. The study is normal. This is a low risk study. Overall left ventricular systolic function was normal. LV cavity size is normal. The left ventricular ejection fraction is hyperdynamic (>65%). There are no significant changes in comparison to the prior study."    2. GERD. Followed by GI. Treated with omeprazole. 3. CAD with STEMI 2009, CABG 2009 4. S/p pacemaker  insertion for SSS 5. Constipation  6. CKD stage III   Pain free. Observation, serial troponin, telemetry. EKG nonacute, 2 troponins negative. No evidence to suggest ACS.  Home 7/10 if no recurrent pain and negative workup  Discussed with daughter at bedside  Code Status: partial--CPR ok. No intubation.  DVT prophylaxis: SCDs Family Communication:  Disposition Plan/Anticipated LOS: obs, <24 hours  Time spent: 32 minutes  Murray Hodgkins, MD  [facility] Pager [phone] 05/09/2015, 12:51 PM

## 2015-05-09 NOTE — ED Notes (Signed)
Amy [name] at bedside

## 2015-05-09 NOTE — ED Notes (Signed)
Pt also has [place] in her mouth, pt has been on inhaler and breathing treatments for bronchitis.

## 2015-05-09 NOTE — ED Provider Notes (Signed)
CSN: [id number]     Arrival date & time 05/09/15  0816 History   First MD Initiated Contact with Patient 05/09/15 [phone]     Chief Complaint  Patient presents with  . Chest Pain     (Consider location/radiation/quality/duration/timing/severity/associated sxs/prior Treatment) Patient is a 81 y.o. female presenting with chest pain. The history is provided by the patient. No language interpreter was used.  Chest Pain Pain location:  L chest Pain quality: aching   Pain radiates to:  Does not radiate Pain radiates to the back: no   Pain severity:  Moderate Duration:  10 minutes Timing:  Constant Progression:  Worsening Chronicity:  New Relieved by:  Nothing Worsened by:  Nothing tried Associated symptoms: dysphagia and palpitations   Associated symptoms: no abdominal pain   Risk factors: no coronary artery disease, no high cholesterol and no hypertension     Past Medical History  Diagnosis Date  . Hypertension   . Hypercholesteremia   . Myocardial infarction     Inferior STEMI 07/2008 - PCI of prox RCA followed byu CABG x 3 in 9/'09  . Pneumonia   . Anxiety   . CAD in native artery     s/p CABG x 3; Last Myoview in 07/2009 - non-ischemic; Echo 03/2012  Aortic Sclerosis with normal EF.  Marland Kitchen Asthma   . Pacemaker   . GERD (gastroesophageal reflux disease)   . Arthritis   . Status post hip hemiarthroplasty left hip by Dr Ronnie Derby 08/08/2012  . PAF (paroxysmal atrial fibrillation)   . CKD (chronic kidney disease), stage III   . DVT (deep vein thrombosis) in pregnancy   . Esophageal perforation     9/13  . Mediastinitis     s/p drainage  . Hypothyroidism   . Shortness of breath   . Fall 07/21/2014    FALL WITH INJURY  . Fracture of head of humerus   . Fracture of hip 07/21/2014    LEFT  . SSS (sick sinus syndrome) 11/26/2014  . Paroxysmal atrial flutter 11/26/2014   Past Surgical History  Procedure Laterality Date  . Coronary artery bypass graft  2009    LIMA-LAD, SVG-RI,  SVG-stented RCA  . Total hip arthroplasty    . Pacemaker insertion  01/06/2009    [employer]  . Wrist fracture surgery  07/2012    left intra articular  with carpel tunnel  . Carpal tunnel release  08/08/2012    Procedure: CARPAL TUNNEL RELEASE;  Surgeon: Schuyler Amor, MD;  Location: [place];  Service: Orthopedics;  Laterality: Left;  . Hip arthroplasty  08/08/2012    Procedure: ARTHROPLASTY BIPOLAR HIP;  Surgeon: Rudean Haskell, MD;  Location: [place];  Service: Orthopedics;  Laterality: Left;  [place]   . Esophagogastroduodenoscopy  08/10/2012    Procedure: ESOPHAGOGASTRODUODENOSCOPY (EGD);  Surgeon: Beryle Beams, MD;  Location: [facility] ENDOSCOPY;  Service: Endoscopy;  Laterality: N/A;  . Esophagoscopy  08/10/2012    Procedure: ESOPHAGOSCOPY;  Surgeon: [name], MD;  Location: [place];  Service: ENT;  Laterality: N/A;  . Gastrostomy  08/16/2012    Procedure: GASTROSTOMY;  Surgeon: Zenovia Jarred, MD;  Location: [place];  Service: General;  Laterality: N/A;  . Joint replacement    . Nm myocar perf wall motion  08/14/2009    Normal  . Left heart catheterization with coronary angiogram N/A 04/16/2014    Procedure: LEFT HEART CATHETERIZATION WITH CORONARY ANGIOGRAM;  Surgeon: Wellington Hampshire, MD;  Location: [place] CATH LAB;  Service: Cardiovascular;  Laterality: N/A;   Family History  Problem Relation Age of Onset  . Stroke Father    History  Substance Use Topics  . Smoking status: Never Smoker   . Smokeless tobacco: Never Used  . Alcohol Use: No   OB History    No data available     Review of Systems  HENT: Positive for trouble swallowing.   Cardiovascular: Positive for chest pain and palpitations.  Gastrointestinal: Negative for abdominal pain.  All other systems reviewed and are negative.     Allergies  Colestipol; Hydrocodone-acetaminophen; Niacin and related; Statins; and Zetia  Home Medications   Prior to Admission medications   Medication Sig Start Date End Date  Taking? Authorizing Provider  albuterol (PROVENTIL HFA;VENTOLIN HFA) 108 (90 BASE) MCG/ACT inhaler Inhale 2 puffs into the lungs every 4 (four) hours as needed for wheezing or shortness of breath. 05/27/14   Mihai Croitoru, MD  amoxicillin (AMOXIL) 500 MG capsule Take 500 mg by mouth daily as needed (dental appointment).     Historical Provider, MD  aspirin EC 81 MG tablet Take 81 mg by mouth daily.    Historical Provider, MD  atenolol (TENORMIN) 25 MG tablet Take 0.5 tablets (12.5 mg total) by mouth daily. 12/01/14   Mihai Croitoru, MD  cycloSPORINE (RESTASIS) 0.05 % ophthalmic emulsion Place 1 drop into both eyes 2 (two) times daily.    Historical Provider, MD  doxycycline (VIBRAMYCIN) 100 MG capsule Take 1 capsule (100 mg total) by mouth 2 (two) times daily. 05/02/15   Daleen Bo, MD  fluticasone-salmeterol (ADVAIR HFA) [phone] MCG/ACT inhaler Inhale 2 puffs into the lungs 2 (two) times daily. 05/02/15   Daleen Bo, MD  levothyroxine (SYNTHROID, LEVOTHROID) 75 MCG tablet Take 75 mcg by mouth daily before breakfast.    Historical Provider, MD  losartan (COZAAR) 100 MG tablet Take 1 tablet (100 mg total) by mouth daily. 03/12/15   Mihai Croitoru, MD  nitroGLYCERIN (NITROSTAT) 0.4 MG SL tablet Place 1 tablet (0.4 mg total) under the tongue every 5 (five) minutes x 3 doses as needed for chest pain. 04/17/14   Rhonda G Barrett, PA-C  omeprazole (PRILOSEC) 20 MG capsule Take 1 capsule (20 mg total) by mouth daily. 03/24/15   Butch Penny, NP  ondansetron (ZOFRAN) 4 MG tablet Take 4 mg by mouth every 6 (six) hours as needed for nausea or vomiting.    Historical Provider, MD  predniSONE (DELTASONE) 20 MG tablet Take 1 tablet (20 mg total) by mouth 2 (two) times daily. 05/02/15   Daleen Bo, MD   BP 158/74 mmHg  Pulse 71  Temp(Src) 97.8 F (36.6 C) (Oral)  Resp 17  Ht 5\' 5"  (1.651 m)  Wt 145 lb (65.772 kg)  BMI 24.13 kg/m2  SpO2 95% Physical Exam  Constitutional: She is oriented to person, place,  and time. She appears well-developed and well-nourished.  HENT:  Head: Normocephalic.  Right Ear: External ear normal.  Left Ear: External ear normal.  Eyes: Conjunctivae and EOM are normal. Pupils are equal, round, and reactive to light.  Neck: Normal range of motion.  Cardiovascular: Normal rate and normal heart sounds.   Pulmonary/Chest: Effort normal.  Abdominal: She exhibits no distension.  Musculoskeletal: Normal range of motion.  Neurological: She is alert and oriented to person, place, and time.  Skin: Skin is warm.  Psychiatric: She has a normal mood and affect.  Nursing note and vitals reviewed.   ED Course  Procedures (including critical  care time) Labs Review Labs Reviewed  BASIC METABOLIC PANEL - Abnormal; Notable for the following:    BUN 27 (*)    Creatinine, Ser 1.37 (*)    GFR calc non Af Amer 33 (*)    GFR calc Af Amer 38 (*)    All other components within normal limits  CBC  TROPONIN I    Imaging Review Dg Chest 2 View  05/09/2015   CLINICAL DATA:  Patient with chest pain and thrush.  EXAM: CHEST  2 VIEW  COMPARISON:  Chest radiograph 05/02/2015  FINDINGS: Multi lead pacer apparatus overlies the left hemi thorax, leads are stable in position. Stable enlarged cardiac and mediastinal contours status post median sternotomy. Monitoring leads overlie the patient. No consolidative pulmonary opacities. Biapical pleural parenchymal thickening. No pleural effusion or pneumothorax. Mid thoracic spine degenerative changes. Suggestion interval healing displaced left humeral head fracture.  IMPRESSION: No acute cardiopulmonary process.  Mild cardiomegaly.   Electronically Signed   By: Lovey Newcomer M.D.   On: 05/09/2015 09:30     EKG Interpretation   Date/Time:  Saturday May 09 2015 08:19:22 EDT Ventricular Rate:  73 PR Interval:  226 QRS Duration: 75 QT Interval:  392 QTC Calculation: 432 R Axis:   -33 Text Interpretation:  Sinus or ectopic atrial rhythm Prolonged  PR interval  Left axis deviation Anterior infarct, old ED PHYSICIAN INTERPRETATION  AVAILABLE IN [place] Confirmed by TEST, Record ([id number]) on 05/09/2015  9:00:06 AM      MDM Pt had second episode of pain.  Pain improved with nitroglycern.  Labs returned  Troponin negative x 2.   I spoke to Dr. Sarajane Jews who will admit   Final diagnoses:  Chest pain        Fransico Meadow, PA-C 05/09/15 [place], MD 05/09/15 1450

## 2015-05-09 NOTE — ED Notes (Signed)
Pt comes in from home for chest pain starting this morning, pt describes as chest pressure.  Pt took 325 aspirin and 1 nitro prior to EMS arrival.   BP 184/100 in route

## 2015-05-10 DIAGNOSIS — K219 Gastro-esophageal reflux disease without esophagitis: Secondary | ICD-10-CM

## 2015-05-10 DIAGNOSIS — R079 Chest pain, unspecified: Secondary | ICD-10-CM | POA: Diagnosis not present

## 2015-05-10 DIAGNOSIS — B370 Candidal stomatitis: Secondary | ICD-10-CM

## 2015-05-10 DIAGNOSIS — I2510 Atherosclerotic heart disease of native coronary artery without angina pectoris: Secondary | ICD-10-CM | POA: Diagnosis not present

## 2015-05-10 LAB — TROPONIN I

## 2015-05-10 MED ORDER — NYSTATIN 100000 UNIT/ML MT SUSP: 5.0000 mL | Freq: Four times a day (QID) | OROMUCOSAL | Status: DC

## 2015-05-10 NOTE — Progress Notes (Signed)
Removed pt IV and telemetry, pt tolerated well.  Reviewed discharge instructions with pt and family, answered questions at this time.

## 2015-05-10 NOTE — Discharge Summary (Signed)
Physician Discharge Summary  Niger M Triplett [id number] DOB: Apr 22, 1922 DOA: 05/09/2015  PCP: Purvis Kilts, MD  Admit date: 05/09/2015 Discharge date: 05/10/2015  Recommendations for Outpatient Follow-up:  1. Chest pain, felt to be GI in origin.   Follow-up Information    Schedule an appointment as soon as possible for a visit with Purvis Kilts, MD.   Specialty:  Family Medicine   Why:  As needed   Contact information:   [address] [place] [id number] [phone]      Discharge Diagnoses:  1. Chest pain with typical and atypical features 2. GERD 3. Coronary artery disease 4. Chronic kidney disease stage III 5. Oral candidiasis 6. Restless leg syndrome  Discharge Condition: improved Disposition: home  Diet recommendation: heart healthy  Filed Weights   05/09/15 0821 05/09/15 1535  Weight: 65.772 kg (145 lb) 72.4 kg (159 lb 9.8 oz)    History of present illness:  72yow PMH CAD, CABG presented to ED with c/o chest pain this AM, relieved with NTG. Initial evaluation in ED was reassuring with negative troponins and she was referred for observation.  Hospital Course:  Ms. Budzynski was observed overnight. She had no recurrent pain. Telemetry was unremarkable. Troponins were negative. No evidence of ACS. Hospitalization was uncomplicated. She has outpatient follow-up arranged with her cardiologist in the near future. She is given a prescription for sequential compression devices for restless leg syndrome. Nystatin for oral candidiasis.  Consultants:  None   Procedures:  None   Discharge Instructions  Discharge Instructions    Activity as tolerated - No restrictions    Complete by:  As directed      Diet - low sodium heart healthy    Complete by:  As directed      Discharge instructions    Complete by:  As directed   Call your physician or seek immediate medical attention for chest pain, shortness of breath or worsening of condition.          Current Discharge Medication List    START taking these medications   Details  nystatin (MYCOSTATIN) 100000 UNIT/ML suspension Take 5 mLs (500,000 Units total) by mouth 4 (four) times daily. Qty: 120 mL, Refills: 0      CONTINUE these medications which have NOT CHANGED   Details  acetaminophen (TYLENOL) 500 MG tablet Take 1,000 mg by mouth every 6 (six) hours as needed for moderate pain.    acidophilus (RISAQUAD) CAPS capsule Take 1 capsule by mouth 2 (two) times daily.    albuterol (PROVENTIL HFA;VENTOLIN HFA) 108 (90 BASE) MCG/ACT inhaler Inhale 2 puffs into the lungs every 4 (four) hours as needed for wheezing or shortness of breath. Qty: 1 Inhaler, Refills: 1    amoxicillin (AMOXIL) 500 MG capsule Take 500 mg by mouth daily as needed (dental appointment).     aspirin EC 81 MG tablet Take 81 mg by mouth daily.    atenolol (TENORMIN) 25 MG tablet Take 0.5 tablets (12.5 mg total) by mouth daily. Qty: 45 tablet, Refills: 3    cycloSPORINE (RESTASIS) 0.05 % ophthalmic emulsion Place 1 drop into both eyes 2 (two) times daily.    doxycycline (VIBRAMYCIN) 100 MG capsule Take 1 capsule (100 mg total) by mouth 2 (two) times daily. Qty: 20 capsule, Refills: 0    fluticasone-salmeterol (ADVAIR HFA) 115-21 MCG/ACT inhaler Inhale 2 puffs into the lungs 2 (two) times daily. Qty: 1 Inhaler, Refills: 12    levothyroxine (SYNTHROID, LEVOTHROID) 75 MCG tablet  Take 75 mcg by mouth daily before breakfast.    LORazepam (ATIVAN) 1 MG tablet Take 1 mg by mouth daily as needed for sleep.    losartan (COZAAR) 100 MG tablet Take 1 tablet (100 mg total) by mouth daily. Qty: 90 tablet, Refills: 3    Loteprednol Etabonate (LOTEMAX) 0.5 % GEL Place 1 drop into both eyes at bedtime.    nitroGLYCERIN (NITROSTAT) 0.4 MG SL tablet Place 1 tablet (0.4 mg total) under the tongue every 5 (five) minutes x 3 doses as needed for chest pain. Qty: 25 tablet, Refills: 12    omeprazole (PRILOSEC) 20 MG  capsule Take 1 capsule (20 mg total) by mouth daily. Qty: 90 capsule, Refills: 3   Associated Diagnoses: Gastroesophageal reflux disease without esophagitis    ondansetron (ZOFRAN) 4 MG tablet Take 4 mg by mouth every 6 (six) hours as needed for nausea or vomiting.      STOP taking these medications     ibuprofen (ADVIL,MOTRIN) 200 MG tablet      predniSONE (DELTASONE) 20 MG tablet        Allergies  Allergen Reactions  . Colestipol     Muscle aches  . Hydrocodone-Acetaminophen Itching    Tolerates tylenol  . Niacin And Related   . Statins     Muscle aches on all she has tried.  She recalls Lipitor, Crestor, and Livalo but thinks there were others  . Zetia [Ezetimibe]     Muscle aches    The results of significant diagnostics from this hospitalization (including imaging, microbiology, ancillary and laboratory) are listed below for reference.    Significant Diagnostic Studies: Dg Chest 2 View  05/09/2015   CLINICAL DATA:  Patient with chest pain and thrush.  EXAM: CHEST  2 VIEW  COMPARISON:  Chest radiograph 05/02/2015  FINDINGS: Multi lead pacer apparatus overlies the left hemi thorax, leads are stable in position. Stable enlarged cardiac and mediastinal contours status post median sternotomy. Monitoring leads overlie the patient. No consolidative pulmonary opacities. Biapical pleural parenchymal thickening. No pleural effusion or pneumothorax. Mid thoracic spine degenerative changes. Suggestion interval healing displaced left humeral head fracture.  IMPRESSION: No acute cardiopulmonary process.  Mild cardiomegaly.   Electronically Signed   By: Lovey Newcomer M.D.   On: 05/09/2015 09:30    Labs: Basic Metabolic Panel:  Recent Labs Lab 05/09/15 0825  NA 140  K 4.3  CL 102  CO2 28  GLUCOSE 79  BUN 27*  CREATININE 1.37*  CALCIUM 9.0   CBC:  Recent Labs Lab 05/09/15 0825  WBC 9.7  HGB 13.4  HCT 40.5  MCV 90.4  PLT 214   Cardiac Enzymes:  Recent Labs Lab  05/09/15 0825 05/09/15 1131 05/09/15 1856 05/09/15 2349  TROPONINI <0.03 <0.03 <0.03 <0.03    Principal Problem:   Chest pain Active Problems:   CAD, CABG X 3 10/09. Myoview low risk 10/10. EF >55% 2D 6/13.   Chronic renal insufficiency, stage III (moderate), SCr 1.4 Sept 2013   Pacemaker March 2010 (MDT)   GERD (gastroesophageal reflux disease)   Time coordinating discharge: 25 minutes  Signed:  Murray Hodgkins, MD [facility] 05/10/2015, 11:54 AM

## 2015-05-10 NOTE — Progress Notes (Signed)
  PROGRESS NOTE  Niger M Ealey [id number] DOB: April 21, 1922 DOA: 05/09/2015 PCP: Purvis Kilts, MD Cardiologist: Dr Sallyanne Kuster  Summary: 69yow PMH CAD, CABG presented to ED with c/o chest pain this AM, relieved with NTG. Initial evaluation in ED was reassuring with negative troponins and she was referred for observation.  Assessment/Plan: 1. Chest pain with typical and atypical features. No recurrent pain, troponins negative, EKG nonacute. No evidence of ACS. Normal nuclear medicine study 03/17/2015. Favor GI as etiology. 2. GERD. Stable. Continue omeprazole. 3. CAD with STEMI 2009, CABG 2009 4. S/p pacemaker insertion for SSS 5. Chronic kidney disease stage III.   Overall much better, pain free. Likely GI in origin. Has appointment with Dr. Sallyanne Kuster 8/2.  Home today  Nystatin for oral candidiasis  SCDs for RLS  Murray Hodgkins, MD  [facility]  Pager [phone] If 7PM-7AM, please contact night-coverage at [email or web address], password [facility] 05/10/2015, 11:11 AM    Consultants:    Procedures:    Antibiotics:    HPI/Subjective: No chest pain. No n/v. Tolerating diet. No SOB.  Objective: Filed Vitals:   05/10/15 0155 05/10/15 0638 05/10/15 0909 05/10/15 1045  BP: 143/66 136/57  123/63  Pulse: 71 71  67  Temp: 99.1 F (37.3 C) 98.8 F (37.1 C)  98.5 F (36.9 C)  TempSrc: Oral Oral  Oral  Resp: 20 20  20   Height:      Weight:      SpO2: 93% 93% 94% 93%    Intake/Output Summary (Last 24 hours) at 05/10/15 1111 Last data filed at 05/10/15 0843  Gross per 24 hour  Intake    470 ml  Output      0 ml  Net    470 ml     Filed Weights   05/09/15 0821 05/09/15 1535  Weight: 65.772 kg (145 lb) 72.4 kg (159 lb 9.8 oz)    Exam:     Afebrile, VSS, no hypoxia General:  Appears calm and comfortable Cardiovascular: RRR, no m/r/g.  Telemetry: SR, no arrhythmias  Respiratory: CTA bilaterally, no w/r/r. Normal respiratory effort. Psychiatric: grossly normal  mood and affect, speech fluent and appropriate  New data reviewed:  Troponins negative  Pertinent data since admission: Chest x-ray independently reviewed, no acute process noted. EKG independently reviewed sinus rhythm, no acute changes  Pending data:    Scheduled Meds: . acidophilus  1 capsule Oral BID  . aspirin EC  81 mg Oral Daily  . atenolol  12.5 mg Oral Daily  . cycloSPORINE  1 drop Both Eyes BID  . doxycycline  100 mg Oral Q12H  . levothyroxine  75 mcg Oral QAC breakfast  . losartan  100 mg Oral Daily  . loteprednol  1 drop Both Eyes QHS  . mometasone-formoterol  2 puff Inhalation BID  . nystatin  5 mL Oral QID  . pantoprazole  40 mg Oral Daily   Continuous Infusions:   Principal Problem:   Chest pain Active Problems:   CAD, CABG X 3 10/09. Myoview low risk 10/10. EF >55% 2D 6/13.   Chronic renal insufficiency, stage III (moderate), SCr 1.4 Sept 2013   Pacemaker March 2010 (MDT)   GERD (gastroesophageal reflux disease)

## 2015-05-18 DIAGNOSIS — J189 Pneumonia, unspecified organism: Secondary | ICD-10-CM | POA: Diagnosis not present

## 2015-05-18 DIAGNOSIS — Z6826 Body mass index (BMI) 26.0-26.9, adult: Secondary | ICD-10-CM | POA: Diagnosis not present

## 2015-05-18 DIAGNOSIS — Z1389 Encounter for screening for other disorder: Secondary | ICD-10-CM | POA: Diagnosis not present

## 2015-05-18 DIAGNOSIS — E663 Overweight: Secondary | ICD-10-CM | POA: Diagnosis not present

## 2015-05-18 DIAGNOSIS — K219 Gastro-esophageal reflux disease without esophagitis: Secondary | ICD-10-CM | POA: Diagnosis not present

## 2015-06-02 ENCOUNTER — Encounter: Payer: Self-pay | Admitting: Cardiovascular Disease

## 2015-06-02 ENCOUNTER — Ambulatory Visit (INDEPENDENT_AMBULATORY_CARE_PROVIDER_SITE_OTHER): Payer: Medicare Other | Admitting: Cardiovascular Disease

## 2015-06-02 VITALS — BP 160/86 | HR 76 | Resp 16 | Ht 65.5 in | Wt 154.4 lb

## 2015-06-02 DIAGNOSIS — I480 Paroxysmal atrial fibrillation: Secondary | ICD-10-CM | POA: Diagnosis not present

## 2015-06-02 DIAGNOSIS — Z950 Presence of cardiac pacemaker: Secondary | ICD-10-CM

## 2015-06-02 DIAGNOSIS — R0989 Other specified symptoms and signs involving the circulatory and respiratory systems: Secondary | ICD-10-CM

## 2015-06-02 DIAGNOSIS — I4892 Unspecified atrial flutter: Secondary | ICD-10-CM | POA: Diagnosis not present

## 2015-06-02 DIAGNOSIS — I2510 Atherosclerotic heart disease of native coronary artery without angina pectoris: Secondary | ICD-10-CM

## 2015-06-02 DIAGNOSIS — I495 Sick sinus syndrome: Secondary | ICD-10-CM

## 2015-06-02 NOTE — Progress Notes (Signed)
Patient ID: Amy [name], female   DOB: 1921-12-24, 81 y.o.   MRN: [id number]     Cardiology Office Note   Date:  06/02/2015   ID:  Amy [name], DOB 1922-09-28, MRN [id number]  PCP:  Purvis Kilts, MD  Cardiologist:   Sanda Klein, MD   Chief Complaint  Patient presents with  . Follow-up  . Shortness of Breath      History of Present Illness: Amy KHAMILA BRYDON is a 81 y.o. female who presents for Pacemaker follow-up, SSS, atrial flutter & atrial fibrillation, CAD p CABG  She has no cardiac complaints. She was hospitalized for chest discomfort in early July and her cardiac workup was negative [facility]). Her symptoms were felt to be gastrointestinal in etiology. She had a normal nuclear stress test in May 2016. Cardiac catheterization in June 2015 showed extensive native CAD but with good vascular supply via bypasses or stented native arteries to all segments.   In 2009 she had an acute inferior wall ST segment elevation myocardial infarction treated with urgent angioplasty and placement of a bare-metal stent. Subsequently, she underwent multivessel bypass surgery with LIMA to LAD, SVG to ramus SVG to RCA. She has no evidence of residual myocardial injury and has preserved left ventricular systolic function.   In 2010 a permanent pacemaker (Medtronic Enrhythm) was implanted for symptomatic bradycardia. She has a remote history of paroxysmal atrial fibrillation. She is not taking anticoagulants at this time. She does take aspirin.   In late 2013 she had a fall complicated by a hip fracture and radius fracture. She had an esophageal perforation complicated by mediastinitis. She had a protracted illness and had a feeding tube for several months. She walks without assistance.   In June 2015, cardiac catheterization showed 90% proximal and mid LAD with patent distal LIMA bypass; 40% ostial circumflex, not bypassed; unchanged 80% proximal ramus intermedius, SVG to ramus occluded;  patent stent right coronary artery, SVG to right coronary artery occluded. Her LVEF was 55%, there was no significant mitral regurgitation and the LVEDP was high normal at 15 mm Hg, (when she was short of breath). [name] stated that her dyspnea is likely multifactorial and cannot be explained completely by cardiac findings.   In October 2015 she had another fall in her home (no syncope, tripped at [facility].carpet transition) and fractured her pelvis and humerus.  . She has fairly severe hyperlipidemia with an LDL cholesterol in excess of 200. She is statin, niacin, ezetimibe and resin intolerant. She has discussed PSCK9 inhibitors with [employer].  She has systolic HTN, but also a tendency to marked orthostatic hypotension (25 mm Hg drop in SBP).  Her activity level has steadily improved following her pelvic fracture in her pacemaker now shows roughly 1 hour activity daily. This is considerably lower than it was before her injury but is nevertheless an improvement.  Device interrogation shows 75% atrial pacing and 0.4% ventricular pacing. Her device is a Medtronic and rhythm DR with an estimated generator longevity of about another 6 years.  In early June there was approximately one hour of atrial fibrillation. This occurred during an episode of left-sided pneumonia.  At home, her blood pressure is typically in the 120/60 range, substantially lower than recorded in the office today.    Past Medical History  Diagnosis Date  . Hypertension   . Hypercholesteremia   . Myocardial infarction     Inferior STEMI 07/2008 - PCI of prox RCA followed byu CABG  x 3 in 9/'09  . Pneumonia   . Anxiety   . CAD in native artery     s/p CABG x 3; Last Myoview in 07/2009 - non-ischemic; Echo 03/2012  Aortic Sclerosis with normal EF.  Marland Kitchen Asthma   . Pacemaker   . GERD (gastroesophageal reflux disease)   . Arthritis   . Status post hip hemiarthroplasty left hip by Dr Ronnie Derby 08/08/2012  . PAF (paroxysmal  atrial fibrillation)   . CKD (chronic kidney disease), stage III   . DVT (deep vein thrombosis) in pregnancy   . Esophageal perforation     9/13  . Mediastinitis     s/p drainage  . Hypothyroidism   . Shortness of breath   . Fall 07/21/2014    FALL WITH INJURY  . Fracture of head of humerus   . Fracture of hip 07/21/2014    LEFT  . SSS (sick sinus syndrome) 11/26/2014  . Paroxysmal atrial flutter 11/26/2014    Past Surgical History  Procedure Laterality Date  . Coronary artery bypass graft  2009    LIMA-LAD, SVG-RI, SVG-stented RCA  . Total hip arthroplasty    . Pacemaker insertion  01/06/2009    Medtronic  . Wrist fracture surgery  07/2012    left intra articular  with carpel tunnel  . Carpal tunnel release  08/08/2012    Procedure: CARPAL TUNNEL RELEASE;  Surgeon: Schuyler Amor, MD;  Location: [place];  Service: Orthopedics;  Laterality: Left;  . Hip arthroplasty  08/08/2012    Procedure: ARTHROPLASTY BIPOLAR HIP;  Surgeon: Rudean Haskell, MD;  Location: [place];  Service: Orthopedics;  Laterality: Left;  [place]   . Esophagogastroduodenoscopy  08/10/2012    Procedure: ESOPHAGOGASTRODUODENOSCOPY (EGD);  Surgeon: Beryle Beams, MD;  Location: [facility] ENDOSCOPY;  Service: Endoscopy;  Laterality: N/A;  . Esophagoscopy  08/10/2012    Procedure: ESOPHAGOSCOPY;  Surgeon: [name], MD;  Location: [place];  Service: ENT;  Laterality: N/A;  . Gastrostomy  08/16/2012    Procedure: GASTROSTOMY;  Surgeon: Zenovia Jarred, MD;  Location: [place];  Service: General;  Laterality: N/A;  . Joint replacement    . Nm myocar perf wall motion  08/14/2009    Normal  . Left heart catheterization with coronary angiogram N/A 04/16/2014    Procedure: LEFT HEART CATHETERIZATION WITH CORONARY ANGIOGRAM;  Surgeon: Wellington Hampshire, MD;  Location: [place] CATH LAB;  Service: Cardiovascular;  Laterality: N/A;     Current Outpatient Prescriptions  Medication Sig Dispense Refill  . acetaminophen (TYLENOL) 500 MG  tablet Take 1,000 mg by mouth every 6 (six) hours as needed for moderate pain.    Marland Kitchen acidophilus (RISAQUAD) CAPS capsule Take 1 capsule by mouth 2 (two) times daily.    Marland Kitchen albuterol (PROVENTIL HFA;VENTOLIN HFA) 108 (90 BASE) MCG/ACT inhaler Inhale 2 puffs into the lungs every 4 (four) hours as needed for wheezing or shortness of breath. 1 Inhaler 1  . amoxicillin (AMOXIL) 500 MG capsule Take 500 mg by mouth daily as needed (dental appointment).     Marland Kitchen aspirin EC 81 MG tablet Take 81 mg by mouth daily.    Marland Kitchen atenolol (TENORMIN) 25 MG tablet Take 0.5 tablets (12.5 mg total) by mouth daily. 45 tablet 3  . cycloSPORINE (RESTASIS) 0.05 % ophthalmic emulsion Place 1 drop into both eyes 2 (two) times daily.    . fluticasone-salmeterol (ADVAIR HFA) 115-21 MCG/ACT inhaler Inhale 2 puffs into the lungs 2 (two) times daily. 1 Inhaler  12  . levothyroxine (SYNTHROID, LEVOTHROID) 75 MCG tablet Take 75 mcg by mouth daily before breakfast.    . LORazepam (ATIVAN) 1 MG tablet Take 1 mg by mouth daily as needed for sleep.    Marland Kitchen losartan (COZAAR) 100 MG tablet Take 1 tablet (100 mg total) by mouth daily. 90 tablet 3  . loteprednol (LOTEMAX) 0.5 % ophthalmic suspension Place 1 drop into both eyes 4 (four) times daily. 1 time daily    . NEXIUM 40 MG capsule Take 1 tablet by mouth daily.    . nitroGLYCERIN (NITROSTAT) 0.4 MG SL tablet Place 1 tablet (0.4 mg total) under the tongue every 5 (five) minutes x 3 doses as needed for chest pain. 25 tablet 12  . omeprazole (PRILOSEC) 20 MG capsule Take 1 capsule (20 mg total) by mouth daily. 90 capsule 3  . ondansetron (ZOFRAN) 4 MG tablet Take 4 mg by mouth every 6 (six) hours as needed for nausea or vomiting.     No current facility-administered medications for this visit.    Allergies:   Colestipol; Hydrocodone-acetaminophen; Niacin and related; Statins; and Zetia    Social History:  The patient  reports that she has never smoked. She has never used smokeless tobacco. She  reports that she does not drink alcohol or use illicit drugs.   Family History:  The patient's family history includes Stroke in her father.    ROS:  Please see the history of present illness.    Otherwise, review of systems positive for upper airway congestion.   All other systems are reviewed and negative.    PHYSICAL EXAM: VS:  BP 160/86 mmHg  Pulse 76  Resp 16  Ht 5' 5.5" (1.664 m)  Wt 154 lb 6.4 oz (70.035 kg)  BMI 25.29 kg/m2 , BMI Body mass index is 25.29 kg/(m^2).  General: Alert, oriented x3, no distress Head: no evidence of trauma, PERRL, EOMI, no exophtalmos or lid lag, no myxedema, no xanthelasma; normal ears, nose and oropharynx Neck: normal jugular venous pulsations and no hepatojugular reflux; brisk carotid pulses without delay and with soft bilateral carotid bruits Chest: clear to auscultation, no signs of consolidation by percussion or palpation, normal fremitus, symmetrical and full respiratory excursions, healthy left subclavian pacemaker Cardiovascular: normal position and quality of the apical impulse, regular rhythm, normal first and second heart sounds, 2/6 early peaking systolic ejection murmur in the aortic focus, no rubs or gallops Abdomen: no tenderness or distention, no masses by palpation, no abnormal pulsatility or arterial bruits, normal bowel sounds, no hepatosplenomegaly Extremities: no clubbing, cyanosis or edema; 2+ radial, ulnar and brachial pulses bilaterally; 2+ right femoral, posterior tibial and dorsalis pedis pulses; 2+ left femoral, posterior tibial and dorsalis pedis pulses; no subclavian or femoral bruits Neurological: grossly nonfocal Psych: euthymic mood, full affect   EKG:  EKG is not ordered today.  Recent Labs: 05/02/2015: ALT 21 05/09/2015: BUN 27*; Creatinine, Ser 1.37*; Hemoglobin 13.4; Platelets 214; Potassium 4.3; Sodium 140    Lipid Panel    Component Value Date/Time   CHOL 293* 04/16/2014 0422   TRIG 178* 04/16/2014 0422    HDL 47 04/16/2014 0422   CHOLHDL 6.2 04/16/2014 0422   VLDL 36 04/16/2014 0422   LDLCALC 210* 04/16/2014 0422      Wt Readings from Last 3 Encounters:  06/02/15 154 lb 6.4 oz (70.035 kg)  05/09/15 159 lb 9.8 oz (72.4 kg)  05/02/15 145 lb (65.772 kg)      Other studies Reviewed: Additional  studies/ records that were reviewed today include: Records from recent hospitalization at [facility]:  ASSESSMENT AND PLAN CAD, CABG X 3 10/09. Remote Stent RCA Currently free of angina. Preserved left ventricular systolic function. No evidence of congestive heart failure. Recent angiography shows 2 out of 3 bypass grafts occluded, but the only territory that is likely ischemic is the ramus intermedius. Recent hospitalization for chest discomfort was felt to be due to GI etiology.  SSS/Pacemaker - dual chamber Medtronic Enrhythm, 2010 Normal device function. Plan Q3 month [employer] remote monitoring. Will turn on atrial ATP for her atrial flutter, hopefully reduce the likelihood of sustained atrial fibrillation or atrial flutter.  PAFib and PAFlutter She had one hour of atrial fibrillation in early July in the setting of pneumonia. She is prone to falls that have been associated with very serious injuries, so I think full anticoagulation would be dangerous. She is currently on aspirin only for stroke prevention. Antiarrhythmics do not appear to be indicated  Hyperlipidemia, statin intolerant  She has very severe hypercholesterolemia and unfortunately have not identified any regimen that she can tolerate. She developed severe aching in her thigh muscles with every lipid-lowering agent that has been tried. This includes numerous statins but also Zetia and colestipol. Niacin was poorly tolerated even in low doses.   Hypertension / orthostatic hypotension Probably this is the strictest blood pressure control we can achieve. Stay well hydrated. Weight at home has been  stable at 152-155 and he has not required diuretics in a long time.      Current medicines are reviewed at length with the patient today.  The patient does not have concerns regarding medicines.  The following changes have been made:  no change  Labs/ tests ordered today include:  No orders of the defined types were placed in this encounter.     Patient Instructions  Remote monitoring is used to monitor your pacemaker from home. This monitoring reduces the number of office visits required to check your device to one time per year. It allows [place] to keep an eye on the functioning of your device to ensure it is working properly. You are scheduled for a device check from home on 09/01/2015. You may send your transmission at any time that day. If you have a wireless device, the transmission will be sent automatically. After your physician reviews your transmission, you will receive a postcard with your next transmission date.  Your physician recommends that you schedule a follow-up appointment in: 12 months with Dr.Maddox Bratcher     Signed, Sanda Klein, MD  06/02/2015 5:03 PM    Sanda Klein, MD, [facility] [phone] office [phone] pager

## 2015-06-02 NOTE — Patient Instructions (Addendum)
Remote monitoring is used to monitor your pacemaker from home. This monitoring reduces the number of office visits required to check your device to one time per year. It allows [place] to keep an eye on the functioning of your device to ensure it is working properly. You are scheduled for a device check from home on 09/01/2015. You may send your transmission at any time that day. If you have a wireless device, the transmission will be sent automatically. After your physician reviews your transmission, you will receive a postcard with your next transmission date.  Your physician recommends that you schedule a follow-up appointment in: 12 months with Dr.Croitoru

## 2015-06-05 LAB — CUP PACEART INCLINIC DEVICE CHECK
Battery Voltage: 2.96 V
Brady Statistic AP VS Percent: 74.07 %
Brady Statistic AS VP Percent: 0.12 %
Brady Statistic AS VS Percent: 25.5 %
Brady Statistic RV Percent Paced: 0.42 %
Date Time Interrogation Session: 20160802145913
Lead Channel Impedance Value: 520 Ohm
Lead Channel Impedance Value: 728 Ohm
Lead Channel Pacing Threshold Amplitude: 1 V
Lead Channel Pacing Threshold Pulse Width: 0.4 ms
Lead Channel Setting Pacing Amplitude: 2 V
Lead Channel Setting Pacing Amplitude: 2.5 V
Lead Channel Setting Sensing Sensitivity: 0.9 mV
MDC IDC MSMT LEADCHNL RA PACING THRESHOLD AMPLITUDE: 1 V
MDC IDC MSMT LEADCHNL RA PACING THRESHOLD PULSEWIDTH: 0.4 ms
MDC IDC MSMT LEADCHNL RV SENSING INTR AMPL: 20.1536
MDC IDC SET LEADCHNL RV PACING PULSEWIDTH: 0.8 ms
MDC IDC SET ZONE DETECTION INTERVAL: 400 ms
MDC IDC STAT BRADY AP VP PERCENT: 0.31 %
MDC IDC STAT BRADY RA PERCENT PACED: 74.38 %
Zone Setting Detection Interval: 450 ms

## 2015-06-10 DIAGNOSIS — D485 Neoplasm of uncertain behavior of skin: Secondary | ICD-10-CM | POA: Diagnosis not present

## 2015-06-10 DIAGNOSIS — Z85828 Personal history of other malignant neoplasm of skin: Secondary | ICD-10-CM | POA: Diagnosis not present

## 2015-06-10 DIAGNOSIS — L570 Actinic keratosis: Secondary | ICD-10-CM | POA: Diagnosis not present

## 2015-07-14 DIAGNOSIS — I10 Essential (primary) hypertension: Secondary | ICD-10-CM | POA: Diagnosis not present

## 2015-07-14 DIAGNOSIS — E039 Hypothyroidism, unspecified: Secondary | ICD-10-CM | POA: Diagnosis not present

## 2015-07-14 DIAGNOSIS — J40 Bronchitis, not specified as acute or chronic: Secondary | ICD-10-CM | POA: Diagnosis not present

## 2015-07-14 DIAGNOSIS — Z23 Encounter for immunization: Secondary | ICD-10-CM | POA: Diagnosis not present

## 2015-07-16 DIAGNOSIS — E039 Hypothyroidism, unspecified: Secondary | ICD-10-CM | POA: Diagnosis not present

## 2015-07-16 DIAGNOSIS — J40 Bronchitis, not specified as acute or chronic: Secondary | ICD-10-CM | POA: Diagnosis not present

## 2015-07-16 DIAGNOSIS — L570 Actinic keratosis: Secondary | ICD-10-CM | POA: Diagnosis not present

## 2015-07-16 DIAGNOSIS — I10 Essential (primary) hypertension: Secondary | ICD-10-CM | POA: Diagnosis not present

## 2015-07-16 DIAGNOSIS — Z85828 Personal history of other malignant neoplasm of skin: Secondary | ICD-10-CM | POA: Diagnosis not present

## 2015-07-16 DIAGNOSIS — R0602 Shortness of breath: Secondary | ICD-10-CM | POA: Diagnosis not present

## 2015-07-29 DIAGNOSIS — E039 Hypothyroidism, unspecified: Secondary | ICD-10-CM | POA: Diagnosis not present

## 2015-07-29 DIAGNOSIS — Z23 Encounter for immunization: Secondary | ICD-10-CM | POA: Diagnosis not present

## 2015-07-29 DIAGNOSIS — I10 Essential (primary) hypertension: Secondary | ICD-10-CM | POA: Diagnosis not present

## 2015-08-06 DIAGNOSIS — H04123 Dry eye syndrome of bilateral lacrimal glands: Secondary | ICD-10-CM | POA: Diagnosis not present

## 2015-08-06 DIAGNOSIS — H40013 Open angle with borderline findings, low risk, bilateral: Secondary | ICD-10-CM | POA: Diagnosis not present

## 2015-08-12 DIAGNOSIS — I10 Essential (primary) hypertension: Secondary | ICD-10-CM | POA: Diagnosis not present

## 2015-09-01 ENCOUNTER — Ambulatory Visit (INDEPENDENT_AMBULATORY_CARE_PROVIDER_SITE_OTHER): Payer: Medicare Other | Admitting: *Deleted

## 2015-09-01 DIAGNOSIS — I495 Sick sinus syndrome: Secondary | ICD-10-CM

## 2015-09-02 NOTE — Progress Notes (Signed)
Remote pacemaker transmission.   

## 2015-09-10 LAB — CUP PACEART REMOTE DEVICE CHECK
Battery Voltage: 2.96 V
Brady Statistic AP VP Percent: 1.13 %
Brady Statistic AS VP Percent: 0.26 %
Brady Statistic AS VS Percent: 13.97 %
Brady Statistic RA Percent Paced: 85.77 %
Brady Statistic RV Percent Paced: 1.4 %
Date Time Interrogation Session: 20161101121811
Implantable Lead Implant Date: 20100309
Implantable Lead Location: 753860
Lead Channel Sensing Intrinsic Amplitude: 1.732 mV
Lead Channel Setting Pacing Amplitude: 2 V
Lead Channel Setting Pacing Pulse Width: 0.8 ms
Lead Channel Setting Sensing Sensitivity: 0.9 mV
MDC IDC LEAD IMPLANT DT: 20100309
MDC IDC LEAD LOCATION: 753859
MDC IDC MSMT LEADCHNL RA IMPEDANCE VALUE: 496 Ohm
MDC IDC MSMT LEADCHNL RV IMPEDANCE VALUE: 712 Ohm
MDC IDC MSMT LEADCHNL RV SENSING INTR AMPL: 16.123 mV
MDC IDC SET LEADCHNL RV PACING AMPLITUDE: 2.5 V
MDC IDC STAT BRADY AP VS PERCENT: 84.63 %

## 2015-09-11 ENCOUNTER — Encounter: Payer: Self-pay | Admitting: Cardiology

## 2015-09-28 ENCOUNTER — Telehealth (INDEPENDENT_AMBULATORY_CARE_PROVIDER_SITE_OTHER): Payer: Self-pay | Admitting: *Deleted

## 2015-09-28 NOTE — Telephone Encounter (Signed)
Maytake an extra Prilosec at night if needed

## 2015-09-28 NOTE — Telephone Encounter (Signed)
Amy [name] daughter called and wants to know if ok for her to take 2 of her reflux pills to help.  Please call her [phone]

## 2015-10-27 ENCOUNTER — Ambulatory Visit (INDEPENDENT_AMBULATORY_CARE_PROVIDER_SITE_OTHER): Payer: Medicare Other | Admitting: Internal Medicine

## 2015-10-27 ENCOUNTER — Encounter (INDEPENDENT_AMBULATORY_CARE_PROVIDER_SITE_OTHER): Payer: Self-pay | Admitting: Internal Medicine

## 2015-10-27 VITALS — BP 126/70 | HR 64 | Temp 97.3°F | Ht 64.0 in | Wt 160.0 lb

## 2015-10-27 DIAGNOSIS — I2510 Atherosclerotic heart disease of native coronary artery without angina pectoris: Secondary | ICD-10-CM

## 2015-10-27 DIAGNOSIS — K219 Gastro-esophageal reflux disease without esophagitis: Secondary | ICD-10-CM | POA: Diagnosis not present

## 2015-10-27 NOTE — Patient Instructions (Signed)
Continue to Omeprazole. OV in 1 year.

## 2015-10-27 NOTE — Progress Notes (Signed)
Subjective:    Patient ID: Amy [name], female    DOB: 12-30-1921, 81 y.o.   MRN: [id number]  HPIHere today for f/u of her constipation. She was last seen in June of th is year. Presently taking Benefiber for her constipation which has helped. She tells me her acid reflux os controlled with the Omeprazole. She stopped the Fish Oil because it gave her reflux at night. Her appetite is good. She has gained 6 pounds since her last visit in June.  She is exercising very little. She is walking with a walker.    Hx of esophageal perforation during intubation in 2013 for a hip fx. She usually has a BM daily. No melena or BRRB.     Review of Systems Past Medical History  Diagnosis Date  . Hypertension   . Hypercholesteremia   . Myocardial infarction [facility])     Inferior STEMI 07/2008 - PCI of prox RCA followed byu CABG x 3 in 9/'09  . Pneumonia   . Anxiety   . CAD in native artery     s/p CABG x 3; Last Myoview in 07/2009 - non-ischemic; Echo 03/2012  Aortic Sclerosis with normal EF.  Marland Kitchen Asthma   . Pacemaker   . GERD (gastroesophageal reflux disease)   . Arthritis   . Status post hip hemiarthroplasty left hip by Dr Ronnie Derby 08/08/2012  . PAF (paroxysmal atrial fibrillation) ([place])   . CKD (chronic kidney disease), stage III   . DVT (deep vein thrombosis) in pregnancy   . Esophageal perforation     9/13  . Mediastinitis     s/p drainage  . Hypothyroidism   . Shortness of breath   . Fall 07/21/2014    FALL WITH INJURY  . Fracture of head of humerus   . Fracture of hip ([place]) 07/21/2014    LEFT  . SSS (sick sinus syndrome) ([place]) 11/26/2014  . Paroxysmal atrial flutter ([place]) 11/26/2014    Past Surgical History  Procedure Laterality Date  . Coronary artery bypass graft  2009    LIMA-LAD, SVG-RI, SVG-stented RCA  . Total hip arthroplasty    . Pacemaker insertion  01/06/2009    Medtronic  . Wrist fracture surgery  07/2012    left intra articular  with carpel tunnel  . Carpal tunnel  release  08/08/2012    Procedure: CARPAL TUNNEL RELEASE;  Surgeon: Schuyler Amor, MD;  Location: [place];  Service: Orthopedics;  Laterality: Left;  . Hip arthroplasty  08/08/2012    Procedure: ARTHROPLASTY BIPOLAR HIP;  Surgeon: Rudean Haskell, MD;  Location: [place];  Service: Orthopedics;  Laterality: Left;  [place]   . Esophagogastroduodenoscopy  08/10/2012    Procedure: ESOPHAGOGASTRODUODENOSCOPY (EGD);  Surgeon: Beryle Beams, MD;  Location: [facility] ENDOSCOPY;  Service: Endoscopy;  Laterality: N/A;  . Esophagoscopy  08/10/2012    Procedure: ESOPHAGOSCOPY;  Surgeon: [name], MD;  Location: [place];  Service: ENT;  Laterality: N/A;  . Gastrostomy  08/16/2012    Procedure: GASTROSTOMY;  Surgeon: Zenovia Jarred, MD;  Location: [place];  Service: General;  Laterality: N/A;  . Joint replacement    . Nm myocar perf wall motion  08/14/2009    Normal  . Left heart catheterization with coronary angiogram N/A 04/16/2014    Procedure: LEFT HEART CATHETERIZATION WITH CORONARY ANGIOGRAM;  Surgeon: Wellington Hampshire, MD;  Location: [place] CATH LAB;  Service: Cardiovascular;  Laterality: N/A;    Allergies  Allergen Reactions  . Colestipol  Muscle aches  . Hydrocodone-Acetaminophen Itching    Tolerates tylenol  . Niacin And Related   . Statins     Muscle aches on all she has tried.  She recalls Lipitor, Crestor, and Livalo but thinks there were others  . Zetia [Ezetimibe]     Muscle aches    Current Outpatient Prescriptions on File Prior to Visit  Medication Sig Dispense Refill  . acetaminophen (TYLENOL) 500 MG tablet Take 1,000 mg by mouth every 6 (six) hours as needed for moderate pain.    Marland Kitchen acidophilus (RISAQUAD) CAPS capsule Take 1 capsule by mouth 2 (two) times daily.    Marland Kitchen albuterol (PROVENTIL HFA;VENTOLIN HFA) 108 (90 BASE) MCG/ACT inhaler Inhale 2 puffs into the lungs every 4 (four) hours as needed for wheezing or shortness of breath. 1 Inhaler 1  . amoxicillin (AMOXIL) 500 MG capsule  Take 500 mg by mouth daily as needed (dental appointment).     Marland Kitchen aspirin EC 81 MG tablet Take 81 mg by mouth daily.    Marland Kitchen atenolol (TENORMIN) 25 MG tablet Take 0.5 tablets (12.5 mg total) by mouth daily. 45 tablet 3  . cycloSPORINE (RESTASIS) 0.05 % ophthalmic emulsion Place 1 drop into both eyes 2 (two) times daily.    . fluticasone-salmeterol (ADVAIR HFA) 115-21 MCG/ACT inhaler Inhale 2 puffs into the lungs 2 (two) times daily. 1 Inhaler 12  . levothyroxine (SYNTHROID, LEVOTHROID) 75 MCG tablet Take 75 mcg by mouth daily before breakfast.    . LORazepam (ATIVAN) 1 MG tablet Take 1 mg by mouth daily as needed for sleep.    Marland Kitchen losartan (COZAAR) 100 MG tablet Take 1 tablet (100 mg total) by mouth daily. 90 tablet 3  . loteprednol (LOTEMAX) 0.5 % ophthalmic suspension Place 1 drop into both eyes 4 (four) times daily. 1 time daily    . nitroGLYCERIN (NITROSTAT) 0.4 MG SL tablet Place 1 tablet (0.4 mg total) under the tongue every 5 (five) minutes x 3 doses as needed for chest pain. 25 tablet 12  . omeprazole (PRILOSEC) 20 MG capsule Take 1 capsule (20 mg total) by mouth daily. 90 capsule 3  . ondansetron (ZOFRAN) 4 MG tablet Take 4 mg by mouth every 6 (six) hours as needed for nausea or vomiting.     No current facility-administered medications on file prior to visit.        Objective:   Physical Exam Blood pressure 126/70, pulse 64, temperature 97.3 F (36.3 C), height 5\' 4"  (1.626 m), weight 160 lb (72.576 kg).  Alert and oriented. Skin warm and dry. Oral mucosa is moist.   . Sclera anicteric, conjunctivae is pink. Thyroid not enlarged. No cervical lymphadenopathy. Lungs clear. Heart regular rate and rhythm.  Abdomen is soft. Bowel sounds are positive. No hepatomegaly. No abdominal masses felt. No tenderness.  No edema to lower extremities.         Assessment & Plan:  GERD controlled at this time with Omeprazole. No problem with constipation.

## 2015-11-04 DIAGNOSIS — H04123 Dry eye syndrome of bilateral lacrimal glands: Secondary | ICD-10-CM | POA: Diagnosis not present

## 2015-11-05 ENCOUNTER — Telehealth (INDEPENDENT_AMBULATORY_CARE_PROVIDER_SITE_OTHER): Payer: Self-pay | Admitting: Internal Medicine

## 2015-11-05 NOTE — Telephone Encounter (Signed)
Juliann Pulse, the pt's daughter called saying Ms. Harju was recently prescribed Doxycycline for dry eyes. They're 20mg  tablets. She's supposed to take two tablets twice a day. They were told the medication has more steroid in it than antibiotics. The concern is due to the pt's history of C-diff and they want to know if it's safe for her to take the medication. They'd like a phone call regarding this.  Kathy's ph# [phone] Thank you.

## 2015-11-06 DIAGNOSIS — I10 Essential (primary) hypertension: Secondary | ICD-10-CM | POA: Diagnosis not present

## 2015-11-06 DIAGNOSIS — E039 Hypothyroidism, unspecified: Secondary | ICD-10-CM | POA: Diagnosis not present

## 2015-11-06 NOTE — Telephone Encounter (Signed)
No answer at home. Message left. 

## 2015-12-01 ENCOUNTER — Ambulatory Visit (INDEPENDENT_AMBULATORY_CARE_PROVIDER_SITE_OTHER): Payer: Medicare Other | Admitting: *Deleted

## 2015-12-01 DIAGNOSIS — I495 Sick sinus syndrome: Secondary | ICD-10-CM

## 2015-12-01 NOTE — Progress Notes (Signed)
Remote pacemaker transmission.   

## 2015-12-13 ENCOUNTER — Other Ambulatory Visit: Payer: Self-pay | Admitting: Cardiovascular Disease

## 2015-12-14 LAB — CUP PACEART REMOTE DEVICE CHECK
Brady Statistic AP VP Percent: 0.5 %
Brady Statistic AP VS Percent: 88.37 %
Brady Statistic AS VS Percent: 10.92 %
Implantable Lead Implant Date: 20100309
Implantable Lead Location: 753859
Implantable Lead Location: 753860
Lead Channel Impedance Value: 520 Ohm
Lead Channel Impedance Value: 736 Ohm
Lead Channel Sensing Intrinsic Amplitude: 18.867 mV
Lead Channel Sensing Intrinsic Amplitude: 2.035 mV
MDC IDC LEAD IMPLANT DT: 20100309
MDC IDC MSMT BATTERY VOLTAGE: 2.96 V
MDC IDC SESS DTM: 20170131143029
MDC IDC SET LEADCHNL RA PACING AMPLITUDE: 2 V
MDC IDC SET LEADCHNL RV PACING AMPLITUDE: 2.5 V
MDC IDC SET LEADCHNL RV PACING PULSEWIDTH: 0.8 ms
MDC IDC SET LEADCHNL RV SENSING SENSITIVITY: 0.9 mV
MDC IDC STAT BRADY AS VP PERCENT: 0.2 %
MDC IDC STAT BRADY RA PERCENT PACED: 88.87 %
MDC IDC STAT BRADY RV PERCENT PACED: 0.7 %

## 2015-12-14 NOTE — Telephone Encounter (Signed)
Rx request sent to pharmacy.  

## 2015-12-15 ENCOUNTER — Encounter: Payer: Self-pay | Admitting: Cardiology

## 2015-12-15 DIAGNOSIS — L570 Actinic keratosis: Secondary | ICD-10-CM | POA: Diagnosis not present

## 2015-12-24 DIAGNOSIS — H04123 Dry eye syndrome of bilateral lacrimal glands: Secondary | ICD-10-CM | POA: Diagnosis not present

## 2015-12-24 DIAGNOSIS — H40013 Open angle with borderline findings, low risk, bilateral: Secondary | ICD-10-CM | POA: Diagnosis not present

## 2015-12-24 DIAGNOSIS — H16103 Unspecified superficial keratitis, bilateral: Secondary | ICD-10-CM | POA: Diagnosis not present

## 2016-02-03 DIAGNOSIS — N183 Chronic kidney disease, stage 3 (moderate): Secondary | ICD-10-CM | POA: Diagnosis not present

## 2016-02-03 DIAGNOSIS — I10 Essential (primary) hypertension: Secondary | ICD-10-CM | POA: Diagnosis not present

## 2016-02-08 ENCOUNTER — Other Ambulatory Visit (HOSPITAL_COMMUNITY): Payer: Self-pay | Admitting: Nephrology

## 2016-02-08 DIAGNOSIS — E039 Hypothyroidism, unspecified: Secondary | ICD-10-CM | POA: Diagnosis not present

## 2016-02-08 DIAGNOSIS — N183 Chronic kidney disease, stage 3 unspecified: Secondary | ICD-10-CM

## 2016-02-08 DIAGNOSIS — I10 Essential (primary) hypertension: Secondary | ICD-10-CM | POA: Diagnosis not present

## 2016-02-11 DIAGNOSIS — I10 Essential (primary) hypertension: Secondary | ICD-10-CM | POA: Diagnosis not present

## 2016-02-11 DIAGNOSIS — E039 Hypothyroidism, unspecified: Secondary | ICD-10-CM | POA: Diagnosis not present

## 2016-02-12 ENCOUNTER — Other Ambulatory Visit: Payer: Self-pay | Admitting: Cardiovascular Disease

## 2016-02-12 NOTE — Telephone Encounter (Signed)
REFILL 

## 2016-02-25 ENCOUNTER — Ambulatory Visit (HOSPITAL_COMMUNITY)
Admission: RE | Admit: 2016-02-25 | Discharge: 2016-02-25 | Disposition: A | Discharge status: Home / Self Care | Payer: Medicare Other | Source: Ambulatory Visit | Attending: Nephrology | Admitting: Nephrology

## 2016-02-25 DIAGNOSIS — N261 Atrophy of kidney (terminal): Secondary | ICD-10-CM | POA: Insufficient documentation

## 2016-02-25 DIAGNOSIS — R809 Proteinuria, unspecified: Secondary | ICD-10-CM | POA: Diagnosis not present

## 2016-02-25 DIAGNOSIS — Z79899 Other long term (current) drug therapy: Secondary | ICD-10-CM | POA: Diagnosis not present

## 2016-02-25 DIAGNOSIS — R7309 Other abnormal glucose: Secondary | ICD-10-CM | POA: Diagnosis not present

## 2016-02-25 DIAGNOSIS — D509 Iron deficiency anemia, unspecified: Secondary | ICD-10-CM | POA: Diagnosis not present

## 2016-02-25 DIAGNOSIS — N183 Chronic kidney disease, stage 3 unspecified: Secondary | ICD-10-CM

## 2016-02-25 DIAGNOSIS — N281 Cyst of kidney, acquired: Secondary | ICD-10-CM | POA: Diagnosis not present

## 2016-02-25 DIAGNOSIS — E559 Vitamin D deficiency, unspecified: Secondary | ICD-10-CM | POA: Diagnosis not present

## 2016-02-25 DIAGNOSIS — I10 Essential (primary) hypertension: Secondary | ICD-10-CM | POA: Diagnosis not present

## 2016-02-25 DIAGNOSIS — N189 Chronic kidney disease, unspecified: Secondary | ICD-10-CM | POA: Diagnosis not present

## 2016-03-01 ENCOUNTER — Ambulatory Visit (INDEPENDENT_AMBULATORY_CARE_PROVIDER_SITE_OTHER): Payer: Medicare Other | Admitting: *Deleted

## 2016-03-01 DIAGNOSIS — I495 Sick sinus syndrome: Secondary | ICD-10-CM

## 2016-03-01 NOTE — Progress Notes (Signed)
Remote pacemaker transmission.   

## 2016-03-08 ENCOUNTER — Telehealth (INDEPENDENT_AMBULATORY_CARE_PROVIDER_SITE_OTHER): Payer: Self-pay | Admitting: Internal Medicine

## 2016-03-08 NOTE — Telephone Encounter (Signed)
Patient's daughter Francie Massing called and wants to speak to Terri.  She stated her mom is having issues going to the bathroom.

## 2016-03-08 NOTE — Telephone Encounter (Signed)
Stool softener one in am and one in pm

## 2016-03-08 NOTE — Telephone Encounter (Signed)
Patient's daughter Francie Massing called and stated that her mom is having issues going to the bathroom.  She would like to talk to Karna Christmas about what they can do.  [phone]

## 2016-04-07 DIAGNOSIS — E039 Hypothyroidism, unspecified: Secondary | ICD-10-CM | POA: Diagnosis not present

## 2016-04-08 ENCOUNTER — Encounter: Payer: Self-pay | Admitting: Cardiology

## 2016-04-11 LAB — CUP PACEART REMOTE DEVICE CHECK
Brady Statistic AP VS Percent: 88.81 %
Brady Statistic RV Percent Paced: 1.79 %
Implantable Lead Implant Date: 20100309
Implantable Lead Location: 753859
Implantable Lead Model: 5076
Lead Channel Impedance Value: 536 Ohm
Lead Channel Sensing Intrinsic Amplitude: 17.838 mV
Lead Channel Setting Pacing Amplitude: 2 V
Lead Channel Setting Sensing Sensitivity: 0.9 mV
MDC IDC LEAD IMPLANT DT: 20100309
MDC IDC LEAD LOCATION: 753860
MDC IDC MSMT BATTERY VOLTAGE: 2.96 V
MDC IDC MSMT LEADCHNL RA SENSING INTR AMPL: 2.251 mV
MDC IDC MSMT LEADCHNL RV IMPEDANCE VALUE: 704 Ohm
MDC IDC SESS DTM: 20170502122203
MDC IDC SET LEADCHNL RV PACING AMPLITUDE: 2.5 V
MDC IDC SET LEADCHNL RV PACING PULSEWIDTH: 0.8 ms
MDC IDC STAT BRADY AP VP PERCENT: 1.11 %
MDC IDC STAT BRADY AS VP PERCENT: 0.67 %
MDC IDC STAT BRADY AS VS PERCENT: 9.4 %
MDC IDC STAT BRADY RA PERCENT PACED: 89.93 %

## 2016-05-11 ENCOUNTER — Other Ambulatory Visit: Payer: Self-pay | Admitting: Cardiovascular Disease

## 2016-05-11 ENCOUNTER — Other Ambulatory Visit (INDEPENDENT_AMBULATORY_CARE_PROVIDER_SITE_OTHER): Payer: Self-pay | Admitting: Internal Medicine

## 2016-05-31 ENCOUNTER — Telehealth (INDEPENDENT_AMBULATORY_CARE_PROVIDER_SITE_OTHER): Payer: Self-pay | Admitting: *Deleted

## 2016-05-31 NOTE — Telephone Encounter (Signed)
Spoke with daughter.Miralax once a day

## 2016-05-31 NOTE — Telephone Encounter (Signed)
Daughter called and would like sometime for you to call her back.  She is doing as you asked and is taking stool softners 1 am and 1 pm.  She is trying if needed 1 am and 2 pm.  She occasionally is still straining.  Can you call her (Daughter) [phone]

## 2016-06-08 ENCOUNTER — Other Ambulatory Visit: Payer: Self-pay | Admitting: Cardiovascular Disease

## 2016-06-08 NOTE — Telephone Encounter (Signed)
Pt must keep upcoming appointment for any future refills.

## 2016-06-14 DIAGNOSIS — L570 Actinic keratosis: Secondary | ICD-10-CM | POA: Diagnosis not present

## 2016-06-16 ENCOUNTER — Encounter: Payer: Self-pay | Admitting: Cardiovascular Disease

## 2016-06-16 ENCOUNTER — Encounter (INDEPENDENT_AMBULATORY_CARE_PROVIDER_SITE_OTHER): Payer: Self-pay

## 2016-06-16 ENCOUNTER — Ambulatory Visit (INDEPENDENT_AMBULATORY_CARE_PROVIDER_SITE_OTHER): Payer: Medicare Other | Admitting: Cardiovascular Disease

## 2016-06-16 VITALS — BP 144/66 | HR 75 | Ht 65.0 in | Wt 165.0 lb

## 2016-06-16 DIAGNOSIS — I495 Sick sinus syndrome: Secondary | ICD-10-CM | POA: Diagnosis not present

## 2016-06-16 DIAGNOSIS — E785 Hyperlipidemia, unspecified: Secondary | ICD-10-CM

## 2016-06-16 DIAGNOSIS — I2510 Atherosclerotic heart disease of native coronary artery without angina pectoris: Secondary | ICD-10-CM

## 2016-06-16 DIAGNOSIS — I4892 Unspecified atrial flutter: Secondary | ICD-10-CM | POA: Diagnosis not present

## 2016-06-16 DIAGNOSIS — Z950 Presence of cardiac pacemaker: Secondary | ICD-10-CM

## 2016-06-16 DIAGNOSIS — I10 Essential (primary) hypertension: Secondary | ICD-10-CM

## 2016-06-16 NOTE — Patient Instructions (Signed)
Dr Sallyanne Kuster recommends that you continue on your current medications as directed. Please refer to the Current Medication list given to you today.  Remote monitoring is used to monitor your Pacemaker of ICD from home. This monitoring reduces the number of office visits required to check your device to one time per year. It allows [place] to keep an eye on the functioning of your device to ensure it is working properly. You are scheduled for a device check from home on Thursday, November 16th, 2017. You may send your transmission at any time that day. If you have a wireless device, the transmission will be sent automatically. After your physician reviews your transmission, you will receive a postcard with your next transmission date.  Dr Sallyanne Kuster recommends that you schedule a follow-up appointment in 12 months with a device check. You will receive a reminder letter in the mail two months in advance. If you don't receive a letter, please call our office to schedule the follow-up appointment.  If you need a refill on your cardiac medications before your next appointment, please call your pharmacy.

## 2016-06-16 NOTE — Progress Notes (Signed)
Cardiology Office Note    Date:  06/16/2016   ID:  Amy M Pauli, DOB [birth year], MRN [id number]  PCP:  Wende Neighbors, MD  Cardiologist:   Sanda Klein, MD   No chief complaint: Pacemaker check, coronary artery disease   History of Present Illness:  Amy [name] is a 82 y.o. female with history of coronary artery disease and previous bypass surgery as well as sinus node dysfunction and paroxysmal atrial arrhythmia here for check on her dual-chamber permanent pacemaker.  She has had fewer serious medical problems compared to the past and she has not been hospitalized during the last year, but is getting progressively older and more frail. She remains very mentally alert. She is accompanied by her daughter.  She has not had problems with angina, dyspnea, syncope, recent palpitations and has not had a recent fall. Denies leg edema or dyspnea. She is extremely sedentary. She's had a lot of issues with constipation.  Pacemaker interrogation shows normal device function. Her Medtronic EnRhythm device has a battery voltage of 2.95 V (implanted 2010, ERI 2.81 V). Lead parameters are good. She is not pacemaker dependent. She has 88.5% atrial pacing and only 1% ventricular pacing. The burden of atrial arrhythmia has been 0.3%, mostly atrial flutter at a rate of about 300 bpm. 2 episode stent out as being more persistent. She had 12 hours of atrial flutter on April 27 and 3 hours of atrial flutter on June 12. During both events the ventricular rate was in the 70s, mostly ventricular paced. She had an episode of paroxysmal atrial tachycardia with one-to-one conduction at a rate of 154 bpm but it only lasted for 4 seconds. She has had a handful of episodes of nonsustained ventricular tachycardia lasting 9-13 beats, but none since last December activity is very low at only 0.6 hours per day.  In 2009 she had an acute inferior wall ST segment elevation myocardial infarction treated with urgent angioplasty and  placement of a bare-metal stent. Subsequently, she underwent multivessel bypass surgery with LIMA to LAD, SVG to ramus SVG to RCA. She has no evidence of residual myocardial injury and has preserved left ventricular systolic function. In June 2015, cardiac catheterization showed 90% proximal and mid LAD with patent distal LIMA bypass; 40% ostial circumflex, not bypassed; unchanged 80% proximal ramus intermedius, SVG to ramus occluded; patent stent right coronary artery, SVG to right coronary artery occluded. Her LVEF was 55%, there was no significant mitral regurgitation and the LVEDP was high normal at 15 mm Hg, (when she was short of breath). [name] stated that her dyspnea is likely multifactorial and cannot be explained completely by cardiac findings. In May 2016 she had a low risk nuclear study.  In 2010 a permanent pacemaker (Medtronic Enrhythm) was implanted for symptomatic bradycardia. Speights documentation of paroxysmal atrial fibrillation and atrial flutter she is not taking anticoagulants to fall/bleeding risk. She does take aspirin.   In late 2013 she had a fall complicated by a hip fracture and radius fracture. She had an esophageal perforation complicated by mediastinitis. She had a protracted illness and had a feeding tube for several months. In October 2015 she had another fall in her home (no syncope, tripped at [facility].carpet transition) and fractured her pelvis and humerus.   She has fairly severe hyperlipidemia with an LDL cholesterol in excess of 200. She is statin, niacin, ezetimibe and resin intolerant. She has discussed PSCK9 inhibitors in the past but has decided not to take these.  She has systolic HTN, but also a tendency to marked orthostatic hypotension (25 mm Hg drop in SBP).  Past Medical History:  Diagnosis Date  . Anxiety   . Arthritis   . Asthma   . CAD in native artery    s/p CABG x 3; Last Myoview in 07/2009 - non-ischemic; Echo 03/2012  Aortic Sclerosis  with normal EF.  . CKD (chronic kidney disease), stage III   . DVT (deep vein thrombosis) in pregnancy   . Esophageal perforation    9/13  . Fall 07/21/2014   FALL WITH INJURY  . Fracture of head of humerus   . Fracture of hip ([place]) 07/21/2014   LEFT  . GERD (gastroesophageal reflux disease)   . Hypercholesteremia   . Hypertension   . Hypothyroidism   . Mediastinitis    s/p drainage  . Myocardial infarction [facility])    Inferior STEMI 07/2008 - PCI of prox RCA followed byu CABG x 3 in 9/'09  . Pacemaker   . PAF (paroxysmal atrial fibrillation) ([place])   . Paroxysmal atrial flutter ([place]) 11/26/2014  . Pneumonia   . Shortness of breath   . SSS (sick sinus syndrome) ([place]) 11/26/2014  . Status post hip hemiarthroplasty left hip by Dr Ronnie Derby 08/08/2012    Past Surgical History:  Procedure Laterality Date  . CARPAL TUNNEL RELEASE  08/08/2012   Procedure: CARPAL TUNNEL RELEASE;  Surgeon: Schuyler Amor, MD;  Location: [place];  Service: Orthopedics;  Laterality: Left;  . CORONARY ARTERY BYPASS GRAFT  2009   LIMA-LAD, SVG-RI, SVG-stented RCA  . ESOPHAGOGASTRODUODENOSCOPY  08/10/2012   Procedure: ESOPHAGOGASTRODUODENOSCOPY (EGD);  Surgeon: Beryle Beams, MD;  Location: [facility] ENDOSCOPY;  Service: Endoscopy;  Laterality: N/A;  . ESOPHAGOSCOPY  08/10/2012   Procedure: ESOPHAGOSCOPY;  Surgeon: [name], MD;  Location: [place];  Service: ENT;  Laterality: N/A;  . GASTROSTOMY  08/16/2012   Procedure: GASTROSTOMY;  Surgeon: Zenovia Jarred, MD;  Location: [place];  Service: General;  Laterality: N/A;  . HIP ARTHROPLASTY  08/08/2012   Procedure: ARTHROPLASTY BIPOLAR HIP;  Surgeon: Rudean Haskell, MD;  Location: [place];  Service: Orthopedics;  Laterality: Left;  [place]   . JOINT REPLACEMENT    . LEFT HEART CATHETERIZATION WITH CORONARY ANGIOGRAM N/A 04/16/2014   Procedure: LEFT HEART CATHETERIZATION WITH CORONARY ANGIOGRAM;  Surgeon: Wellington Hampshire, MD;  Location: [place] CATH LAB;  Service:  Cardiovascular;  Laterality: N/A;  . NM MYOCAR PERF WALL MOTION  08/14/2009   Normal  . PACEMAKER INSERTION  01/06/2009   Medtronic  . TOTAL HIP ARTHROPLASTY    . WRIST FRACTURE SURGERY  07/2012   left intra articular  with carpel tunnel    Current Medications: Outpatient Medications Prior to Visit  Medication Sig Dispense Refill  . acetaminophen (TYLENOL) 500 MG tablet Take 1,000 mg by mouth every 6 (six) hours as needed for moderate pain.    Marland Kitchen acidophilus (RISAQUAD) CAPS capsule Take 1 capsule by mouth 2 (two) times daily.    Marland Kitchen albuterol (PROVENTIL HFA;VENTOLIN HFA) 108 (90 BASE) MCG/ACT inhaler Inhale 2 puffs into the lungs every 4 (four) hours as needed for wheezing or shortness of breath. 1 Inhaler 1  . amoxicillin (AMOXIL) 500 MG capsule Take 500 mg by mouth daily as needed (dental appointment).     Marland Kitchen aspirin EC 81 MG tablet Take 81 mg by mouth daily.    Marland Kitchen atenolol (TENORMIN) 25 MG tablet TAKE ONE-HALF TABLET BY MOUTH ONCE DAILY 15  tablet 0  . cycloSPORINE (RESTASIS) 0.05 % ophthalmic emulsion Place 1 drop into both eyes 2 (two) times daily.    . fluticasone-salmeterol (ADVAIR HFA) 115-21 MCG/ACT inhaler Inhale 2 puffs into the lungs 2 (two) times daily. 1 Inhaler 12  . LORazepam (ATIVAN) 1 MG tablet Take 1 mg by mouth daily as needed for sleep.    Marland Kitchen losartan (COZAAR) 100 MG tablet TAKE ONE TABLET BY MOUTH ONCE DAILY 90 tablet 0  . nitroGLYCERIN (NITROSTAT) 0.4 MG SL tablet Place 1 tablet (0.4 mg total) under the tongue every 5 (five) minutes x 3 doses as needed for chest pain. 25 tablet 12  . omeprazole (PRILOSEC) 20 MG capsule TAKE ONE CAPSULE BY MOUTH ONCE DAILY 30  MINUTES  BEFORE  BREAKFAST 90 capsule 0  . ondansetron (ZOFRAN) 4 MG tablet Take 4 mg by mouth every 6 (six) hours as needed for nausea or vomiting.    Marland Kitchen levothyroxine (SYNTHROID, LEVOTHROID) 75 MCG tablet Take 75 mcg by mouth daily before breakfast.    . loteprednol (LOTEMAX) 0.5 % ophthalmic suspension Place 1 drop  into both eyes 4 (four) times daily. 1 time daily     No facility-administered medications prior to visit.      Allergies:   Colestipol; Hydrocodone-acetaminophen; Niacin and related; Statins; and Zetia [ezetimibe]   Social History   Social History  . Marital status: Widowed    Spouse name: N/A  . Number of children: N/A  . Years of education: N/A   Social History Main Topics  . Smoking status: Never Smoker  . Smokeless tobacco: Never Used  . Alcohol use No  . Drug use: No  . Sexual activity: No   Other Topics Concern  . None   Social History Narrative  . None     Family History:  The patient's family history includes Stroke in her father.   ROS:   Please see the history of present illness.    ROS All other systems reviewed and are negative.   PHYSICAL EXAM:   VS:  BP (!) 144/66   Pulse 75   Ht 5\' 5"  (1.651 m)   Wt 165 lb (74.8 kg)   BMI 27.46 kg/m    GEN: Well nourished, well developed, in no acute distress , in a wheelchair HEENT: normal  Neck: no JVD, carotid bruits, or masses Cardiac: RRR; no murmurs, rubs, or gallops,no edema , healthy pacemaker site Respiratory:  clear to auscultation bilaterally, normal work of breathing GI: soft, nontender, nondistended, + BS MS: no deformity or atrophy  Skin: warm and dry, no rash Neuro:  Alert and Oriented x 3, Strength and sensation are intact Psych: euthymic mood, full affect  Wt Readings from Last 3 Encounters:  06/16/16 165 lb (74.8 kg)  10/27/15 160 lb (72.6 kg)  06/02/15 154 lb 6.4 oz (70 kg)      Studies/Labs Reviewed:   EKG:  EKG is ordered today.  The ekg ordered today demonstrates Atrial paced, ventricular sensed, QS in V1-V2 and poor R-wave progression, QTC 422 ms. No acute repolarization abnormalities  Lipid Panel    Component Value Date/Time   CHOL 293 (H) 04/16/2014 0422   TRIG 178 (H) 04/16/2014 0422   HDL 47 04/16/2014 0422   CHOLHDL 6.2 04/16/2014 0422   VLDL 36 04/16/2014 0422    LDLCALC 210 (H) 04/16/2014 0422    ASSESSMENT:    1. Coronary artery disease involving native coronary artery of native heart without angina pectoris  2. SSS (sick sinus syndrome) (HCC)   3. Paroxysmal atrial flutter ([place])   4. Hyperlipidemia   5. Essential hypertension   6. Pacemaker      PLAN:  In order of problems listed above:  1. CAD s/p CABG: in spite of extensive coronary problems in the past she is currently asymptomatic, may be in part due to sedentary lifestyle 2. SSS: Almost 90% atrial paced, satisfactory heart rate histogram considering her lifestyle. 3. AFlutter: Poor anticoagulation candidate due to serious and frequent falls. The burden of arrhythmia is low, rate control is adequate and she has no symptoms during the arrhythmia. No change in management. It is doubtful that she would benefit from a Watchman device at her age. 4. HLP: Unfortunately without any good options for lipid lowering. 5. HTN: Satisfactory control. Avoid excessive lead tight blood pressure control due to tendency to orthostatic hypotension and falls 6. PPM: Normal device function. Continue remote downloads every 3 months. Although this particular model has shown possible early battery depletion, so far no signs of a problem and she is not pacemaker dependent.    Medication Adjustments/Labs and Tests Ordered: Current medicines are reviewed at length with the patient today.  Concerns regarding medicines are outlined above.  Medication changes, Labs and Tests ordered today are listed in the Patient Instructions below. Patient Instructions  Dr Sallyanne Kuster recommends that you continue on your current medications as directed. Please refer to the Current Medication list given to you today.  Remote monitoring is used to monitor your Pacemaker of ICD from home. This monitoring reduces the number of office visits required to check your device to one time per year. It allows [place] to keep an eye on the functioning  of your device to ensure it is working properly. You are scheduled for a device check from home on Thursday, November 16th, 2017. You may send your transmission at any time that day. If you have a wireless device, the transmission will be sent automatically. After your physician reviews your transmission, you will receive a postcard with your next transmission date.  Dr Sallyanne Kuster recommends that you schedule a follow-up appointment in 12 months with a device check. You will receive a reminder letter in the mail two months in advance. If you don't receive a letter, please call our office to schedule the follow-up appointment.  If you need a refill on your cardiac medications before your next appointment, please call your pharmacy.    Signed, Sanda Klein, MD  06/16/2016 3:55 PM    [address], [place], [place]  [address] Phone: [phone]; Fax: [phone]

## 2016-06-20 ENCOUNTER — Telehealth: Payer: Self-pay | Admitting: Pharmacist

## 2016-06-20 MED ORDER — METOPROLOL SUCCINATE ER 25 MG PO TB24: 12.5000 mg | ORAL_TABLET | Freq: Every day | ORAL | 5 refills | Status: DC

## 2016-06-20 NOTE — Telephone Encounter (Signed)
LM that will change atenolol to metoprolol succinate 12.5mg  once daily. Order sent to [facility] per pt request on form.

## 2016-06-28 LAB — CUP PACEART INCLINIC DEVICE CHECK
Battery Voltage: 2.95 V
Brady Statistic AS VP Percent: 0.33 %
Brady Statistic AS VS Percent: 11.1 %
Brady Statistic RA Percent Paced: 88.57 %
Implantable Lead Implant Date: 20100309
Implantable Lead Location: 753859
Implantable Lead Location: 753860
Lead Channel Impedance Value: 504 Ohm
Lead Channel Sensing Intrinsic Amplitude: 13.379 mV
Lead Channel Setting Pacing Amplitude: 2 V
Lead Channel Setting Pacing Pulse Width: 0.8 ms
Lead Channel Setting Sensing Sensitivity: 0.9 mV
MDC IDC LEAD IMPLANT DT: 20100309
MDC IDC MSMT LEADCHNL RA SENSING INTR AMPL: 2.987 mV
MDC IDC MSMT LEADCHNL RV IMPEDANCE VALUE: 688 Ohm
MDC IDC SESS DTM: 20170817141313
MDC IDC SET LEADCHNL RV PACING AMPLITUDE: 2.5 V
MDC IDC STAT BRADY AP VP PERCENT: 0.72 %
MDC IDC STAT BRADY AP VS PERCENT: 87.85 %
MDC IDC STAT BRADY RV PERCENT PACED: 1.05 %

## 2016-07-01 ENCOUNTER — Encounter: Payer: Self-pay | Admitting: Cardiovascular Disease

## 2016-07-01 ENCOUNTER — Other Ambulatory Visit: Payer: Self-pay

## 2016-07-07 ENCOUNTER — Ambulatory Visit (INDEPENDENT_AMBULATORY_CARE_PROVIDER_SITE_OTHER): Payer: Medicare Other | Admitting: Internal Medicine

## 2016-07-07 ENCOUNTER — Encounter (INDEPENDENT_AMBULATORY_CARE_PROVIDER_SITE_OTHER): Payer: Self-pay | Admitting: Internal Medicine

## 2016-07-07 VITALS — BP 138/86 | Temp 97.6°F | Ht 65.5 in | Wt 164.1 lb

## 2016-07-07 DIAGNOSIS — K219 Gastro-esophageal reflux disease without esophagitis: Secondary | ICD-10-CM

## 2016-07-07 DIAGNOSIS — I2510 Atherosclerotic heart disease of native coronary artery without angina pectoris: Secondary | ICD-10-CM | POA: Diagnosis not present

## 2016-07-07 MED ORDER — RANITIDINE HCL 150 MG PO TABS
150.0000 mg | ORAL_TABLET | Freq: Every day | ORAL | 3 refills | Status: DC
Start: 2016-07-07 — End: 2017-08-19

## 2016-07-07 MED ORDER — OMEPRAZOLE 40 MG PO CPDR: 40.0000 mg | DELAYED_RELEASE_CAPSULE | Freq: Every day | ORAL | 3 refills | Status: DC

## 2016-07-07 NOTE — Progress Notes (Signed)
Subjective:    Patient ID: Amy [name], female    DOB: 27-May-1922, 82 y.o.   MRN: [id number]  HPI Here today for f/u. She was last seen in December of 2016. Her weight was 160. Hx of chronic constipation. She presents today with c/o that she took 2 of her Prilosec and some Tums. She had acid reflux. She also says this am she had diarrhea with stomach cramps.  She says she is taking Miralax daily. She takes 1 tablespoonful.  She says she did not sleep good.  She did have another BM this am and it was firm.  Her acid reflux is usually at night. She has blocks on her bed.     Review of Systems Past Medical History:  Diagnosis Date  . Anxiety   . Arthritis   . Asthma   . CAD in native artery    s/p CABG x 3; Last Myoview in 07/2009 - non-ischemic; Echo 03/2012  Aortic Sclerosis with normal EF.  . CKD (chronic kidney disease), stage III   . DVT (deep vein thrombosis) in pregnancy   . Esophageal perforation    9/13  . Fall 07/21/2014   FALL WITH INJURY  . Fracture of head of humerus   . Fracture of hip ([place]) 07/21/2014   LEFT  . GERD (gastroesophageal reflux disease)   . Hypercholesteremia   . Hypertension   . Hypothyroidism   . Mediastinitis    s/p drainage  . Myocardial infarction [facility])    Inferior STEMI 07/2008 - PCI of prox RCA followed byu CABG x 3 in 9/'09  . Pacemaker   . PAF (paroxysmal atrial fibrillation) ([place])   . Paroxysmal atrial flutter ([place]) 11/26/2014  . Pneumonia   . Shortness of breath   . SSS (sick sinus syndrome) ([place]) 11/26/2014  . Status post hip hemiarthroplasty left hip by Dr Ronnie Derby 08/08/2012    Past Surgical History:  Procedure Laterality Date  . CARPAL TUNNEL RELEASE  08/08/2012   Procedure: CARPAL TUNNEL RELEASE;  Surgeon: Schuyler Amor, MD;  Location: [place];  Service: Orthopedics;  Laterality: Left;  . CORONARY ARTERY BYPASS GRAFT  2009   LIMA-LAD, SVG-RI, SVG-stented RCA  . ESOPHAGOGASTRODUODENOSCOPY  08/10/2012   Procedure:  ESOPHAGOGASTRODUODENOSCOPY (EGD);  Surgeon: Beryle Beams, MD;  Location: [facility] ENDOSCOPY;  Service: Endoscopy;  Laterality: N/A;  . ESOPHAGOSCOPY  08/10/2012   Procedure: ESOPHAGOSCOPY;  Surgeon: [name], MD;  Location: [place];  Service: ENT;  Laterality: N/A;  . GASTROSTOMY  08/16/2012   Procedure: GASTROSTOMY;  Surgeon: Zenovia Jarred, MD;  Location: [place];  Service: General;  Laterality: N/A;  . HIP ARTHROPLASTY  08/08/2012   Procedure: ARTHROPLASTY BIPOLAR HIP;  Surgeon: Rudean Haskell, MD;  Location: [place];  Service: Orthopedics;  Laterality: Left;  [place]   . JOINT REPLACEMENT    . LEFT HEART CATHETERIZATION WITH CORONARY ANGIOGRAM N/A 04/16/2014   Procedure: LEFT HEART CATHETERIZATION WITH CORONARY ANGIOGRAM;  Surgeon: Wellington Hampshire, MD;  Location: [place] CATH LAB;  Service: Cardiovascular;  Laterality: N/A;  . NM MYOCAR PERF WALL MOTION  08/14/2009   Normal  . PACEMAKER INSERTION  01/06/2009   Medtronic  . TOTAL HIP ARTHROPLASTY    . WRIST FRACTURE SURGERY  07/2012   left intra articular  with carpel tunnel    Allergies  Allergen Reactions  . Colestipol     Muscle aches  . Hydrocodone-Acetaminophen Itching    Tolerates tylenol  . Niacin And Related   .  Statins     Muscle aches on all she has tried.  She recalls Lipitor, Crestor, and Livalo but thinks there were others  . Zetia [Ezetimibe]     Muscle aches    Current Outpatient Prescriptions on File Prior to Visit  Medication Sig Dispense Refill  . acetaminophen (TYLENOL) 500 MG tablet Take 1,000 mg by mouth every 6 (six) hours as needed for moderate pain.    Marland Kitchen acidophilus (RISAQUAD) CAPS capsule Take 1 capsule by mouth 2 (two) times daily.    Marland Kitchen albuterol (PROVENTIL HFA;VENTOLIN HFA) 108 (90 BASE) MCG/ACT inhaler Inhale 2 puffs into the lungs every 4 (four) hours as needed for wheezing or shortness of breath. 1 Inhaler 1  . amoxicillin (AMOXIL) 500 MG capsule Take 500 mg by mouth daily as needed (dental appointment).      Marland Kitchen aspirin EC 81 MG tablet Take 81 mg by mouth daily.    . Cholecalciferol (VITAMIN D3 PO) Take 1 tablet by mouth daily.    . cycloSPORINE (RESTASIS) 0.05 % ophthalmic emulsion Place 1 drop into both eyes 2 (two) times daily.    . fluticasone-salmeterol (ADVAIR HFA) 115-21 MCG/ACT inhaler Inhale 2 puffs into the lungs 2 (two) times daily. 1 Inhaler 12  . LORazepam (ATIVAN) 1 MG tablet Take 1 mg by mouth daily as needed for sleep.    Marland Kitchen losartan (COZAAR) 100 MG tablet TAKE ONE TABLET BY MOUTH ONCE DAILY 90 tablet 0  . metoprolol succinate (TOPROL XL) 25 MG 24 hr tablet Take 0.5 tablets (12.5 mg total) by mouth daily. 15 tablet 5  . nitroGLYCERIN (NITROSTAT) 0.4 MG SL tablet Place 1 tablet (0.4 mg total) under the tongue every 5 (five) minutes x 3 doses as needed for chest pain. 25 tablet 12  . omeprazole (PRILOSEC) 20 MG capsule TAKE ONE CAPSULE BY MOUTH ONCE DAILY 30  MINUTES  BEFORE  BREAKFAST 90 capsule 0  . ondansetron (ZOFRAN) 4 MG tablet Take 4 mg by mouth every 6 (six) hours as needed for nausea or vomiting.     No current facility-administered medications on file prior to visit.        Objective:   Physical Exam Blood pressure 138/86, temperature 97.6 F (36.4 C), height 5' 5.5" (1.664 m), weight 164 lb 1.6 oz (74.4 kg).  Alert and oriented. Skin warm and dry. Oral mucosa is moist.   . Sclera anicteric, conjunctivae is pink. Thyroid not enlarged. No cervical lymphadenopathy. Lungs clear. Heart regular rate and rhythm.  Abdomen is soft. Bowel sounds are positive. No hepatomegaly. No abdominal masses felt. No tenderness.  No edema to lower extremities.         Assessment & Plan:  GERD. Rx for Omeprazole 40mg  daily and can Zantac at night. Continue the Miralax 1/2 tablespoon daily. OV in 1 year.

## 2016-07-07 NOTE — Patient Instructions (Signed)
Rx for Omeprazole 40mg  daily. Rx for Zantac. OV in 1 year.

## 2016-07-20 DIAGNOSIS — Z79899 Other long term (current) drug therapy: Secondary | ICD-10-CM | POA: Diagnosis not present

## 2016-07-20 DIAGNOSIS — E559 Vitamin D deficiency, unspecified: Secondary | ICD-10-CM | POA: Diagnosis not present

## 2016-07-20 DIAGNOSIS — D509 Iron deficiency anemia, unspecified: Secondary | ICD-10-CM | POA: Diagnosis not present

## 2016-07-20 DIAGNOSIS — I10 Essential (primary) hypertension: Secondary | ICD-10-CM | POA: Diagnosis not present

## 2016-07-20 DIAGNOSIS — R809 Proteinuria, unspecified: Secondary | ICD-10-CM | POA: Diagnosis not present

## 2016-07-20 DIAGNOSIS — N183 Chronic kidney disease, stage 3 (moderate): Secondary | ICD-10-CM | POA: Diagnosis not present

## 2016-07-27 DIAGNOSIS — D649 Anemia, unspecified: Secondary | ICD-10-CM | POA: Diagnosis not present

## 2016-07-27 DIAGNOSIS — N183 Chronic kidney disease, stage 3 (moderate): Secondary | ICD-10-CM | POA: Diagnosis not present

## 2016-07-27 DIAGNOSIS — E559 Vitamin D deficiency, unspecified: Secondary | ICD-10-CM | POA: Diagnosis not present

## 2016-07-27 DIAGNOSIS — I10 Essential (primary) hypertension: Secondary | ICD-10-CM | POA: Diagnosis not present

## 2016-08-01 DIAGNOSIS — R7301 Impaired fasting glucose: Secondary | ICD-10-CM | POA: Diagnosis not present

## 2016-08-01 DIAGNOSIS — E785 Hyperlipidemia, unspecified: Secondary | ICD-10-CM | POA: Diagnosis not present

## 2016-08-01 DIAGNOSIS — E119 Type 2 diabetes mellitus without complications: Secondary | ICD-10-CM | POA: Diagnosis not present

## 2016-08-01 DIAGNOSIS — D509 Iron deficiency anemia, unspecified: Secondary | ICD-10-CM | POA: Diagnosis not present

## 2016-08-01 DIAGNOSIS — I482 Chronic atrial fibrillation: Secondary | ICD-10-CM | POA: Diagnosis not present

## 2016-08-01 DIAGNOSIS — I10 Essential (primary) hypertension: Secondary | ICD-10-CM | POA: Diagnosis not present

## 2016-08-01 DIAGNOSIS — E782 Mixed hyperlipidemia: Secondary | ICD-10-CM | POA: Diagnosis not present

## 2016-08-01 DIAGNOSIS — E039 Hypothyroidism, unspecified: Secondary | ICD-10-CM | POA: Diagnosis not present

## 2016-08-05 DIAGNOSIS — E782 Mixed hyperlipidemia: Secondary | ICD-10-CM | POA: Diagnosis not present

## 2016-08-05 DIAGNOSIS — Z6826 Body mass index (BMI) 26.0-26.9, adult: Secondary | ICD-10-CM | POA: Diagnosis not present

## 2016-08-05 DIAGNOSIS — R110 Nausea: Secondary | ICD-10-CM | POA: Diagnosis not present

## 2016-08-05 DIAGNOSIS — K5900 Constipation, unspecified: Secondary | ICD-10-CM | POA: Diagnosis not present

## 2016-08-05 DIAGNOSIS — F419 Anxiety disorder, unspecified: Secondary | ICD-10-CM | POA: Diagnosis not present

## 2016-08-05 DIAGNOSIS — R944 Abnormal results of kidney function studies: Secondary | ICD-10-CM | POA: Diagnosis not present

## 2016-08-05 DIAGNOSIS — G4700 Insomnia, unspecified: Secondary | ICD-10-CM | POA: Diagnosis not present

## 2016-08-05 DIAGNOSIS — E039 Hypothyroidism, unspecified: Secondary | ICD-10-CM | POA: Diagnosis not present

## 2016-08-05 DIAGNOSIS — Z23 Encounter for immunization: Secondary | ICD-10-CM | POA: Diagnosis not present

## 2016-08-05 DIAGNOSIS — J449 Chronic obstructive pulmonary disease, unspecified: Secondary | ICD-10-CM | POA: Diagnosis not present

## 2016-08-05 DIAGNOSIS — I10 Essential (primary) hypertension: Secondary | ICD-10-CM | POA: Diagnosis not present

## 2016-08-18 DIAGNOSIS — H16103 Unspecified superficial keratitis, bilateral: Secondary | ICD-10-CM | POA: Diagnosis not present

## 2016-08-18 DIAGNOSIS — H524 Presbyopia: Secondary | ICD-10-CM | POA: Diagnosis not present

## 2016-08-18 DIAGNOSIS — H04123 Dry eye syndrome of bilateral lacrimal glands: Secondary | ICD-10-CM | POA: Diagnosis not present

## 2016-08-18 DIAGNOSIS — H40013 Open angle with borderline findings, low risk, bilateral: Secondary | ICD-10-CM | POA: Diagnosis not present

## 2016-08-19 ENCOUNTER — Other Ambulatory Visit: Payer: Self-pay | Admitting: Cardiovascular Disease

## 2016-08-19 NOTE — Telephone Encounter (Signed)
Rx(s) sent to pharmacy electronically.  

## 2016-09-15 ENCOUNTER — Ambulatory Visit (INDEPENDENT_AMBULATORY_CARE_PROVIDER_SITE_OTHER): Payer: Medicare Other | Admitting: *Deleted

## 2016-09-15 DIAGNOSIS — I495 Sick sinus syndrome: Secondary | ICD-10-CM | POA: Diagnosis not present

## 2016-09-15 NOTE — Progress Notes (Signed)
Remote pacemaker transmission.   

## 2016-09-21 ENCOUNTER — Encounter: Payer: Self-pay | Admitting: Cardiology

## 2016-09-24 LAB — CUP PACEART REMOTE DEVICE CHECK
Brady Statistic AP VP Percent: 0.19 %
Brady Statistic AP VS Percent: 78.6 %
Brady Statistic AS VP Percent: 0.09 %
Brady Statistic AS VS Percent: 21.11 %
Date Time Interrogation Session: 20171116141215
Implantable Lead Implant Date: 20100309
Implantable Lead Location: 753859
Implantable Lead Model: 5076
Lead Channel Impedance Value: 480 Ohm
Lead Channel Impedance Value: 704 Ohm
Lead Channel Sensing Intrinsic Amplitude: 19.21 mV
Lead Channel Sensing Intrinsic Amplitude: 2.425 mV
Lead Channel Setting Pacing Amplitude: 2.5 V
MDC IDC LEAD IMPLANT DT: 20100309
MDC IDC LEAD LOCATION: 753860
MDC IDC MSMT BATTERY VOLTAGE: 2.93 V
MDC IDC PG IMPLANT DT: 20100309
MDC IDC SET LEADCHNL RA PACING AMPLITUDE: 2 V
MDC IDC SET LEADCHNL RV PACING PULSEWIDTH: 0.8 ms
MDC IDC SET LEADCHNL RV SENSING SENSITIVITY: 0.9 mV
MDC IDC STAT BRADY RA PERCENT PACED: 78.79 %
MDC IDC STAT BRADY RV PERCENT PACED: 0.28 %

## 2016-09-26 ENCOUNTER — Encounter (INDEPENDENT_AMBULATORY_CARE_PROVIDER_SITE_OTHER): Payer: Self-pay

## 2016-09-26 ENCOUNTER — Encounter (INDEPENDENT_AMBULATORY_CARE_PROVIDER_SITE_OTHER): Payer: Self-pay | Admitting: Internal Medicine

## 2016-10-26 ENCOUNTER — Encounter (INDEPENDENT_AMBULATORY_CARE_PROVIDER_SITE_OTHER): Payer: Self-pay | Admitting: Internal Medicine

## 2016-10-26 ENCOUNTER — Ambulatory Visit (INDEPENDENT_AMBULATORY_CARE_PROVIDER_SITE_OTHER): Payer: Medicare Other | Admitting: Internal Medicine

## 2016-10-26 VITALS — BP 130/74 | HR 72 | Temp 97.5°F | Ht 65.0 in | Wt 164.1 lb

## 2016-10-26 DIAGNOSIS — K219 Gastro-esophageal reflux disease without esophagitis: Secondary | ICD-10-CM

## 2016-10-26 DIAGNOSIS — I2510 Atherosclerotic heart disease of native coronary artery without angina pectoris: Secondary | ICD-10-CM | POA: Diagnosis not present

## 2016-10-26 MED ORDER — SUCRALFATE 1 GM/10ML PO SUSP: 1.0000 g | Freq: Four times a day (QID) | ORAL | 1 refills | Status: DC

## 2016-10-26 NOTE — Patient Instructions (Signed)
Rx for Carafate.  OV in 1 year.

## 2016-10-26 NOTE — Progress Notes (Signed)
Subjective:    Patient ID: Amy [name], female    DOB: [birth year], 82 y.o.   MRN: [id number]  HPIHere today for f/u. She was last seen in October for GERD by me. She tells me her acid reflux is worse at night. Takes Zantac at night. Has been using Tums at night for this. HOB is elevated on 2 blocks. She has very little acid reflux during the day. Acid reflux is worse during the night. She eats her heavy meal at lunch and tries to eat light at lunch.           Review of Systems Past Medical History:  Diagnosis Date  . Anxiety   . Arthritis   . Asthma   . CAD in native artery    s/p CABG x 3; Last Myoview in 07/2009 - non-ischemic; Echo 03/2012  Aortic Sclerosis with normal EF.  . CKD (chronic kidney disease), stage III   . DVT (deep vein thrombosis) in pregnancy ([place])   . Esophageal perforation    9/13  . Fall 07/21/2014   FALL WITH INJURY  . Fracture of head of humerus   . Fracture of hip ([place]) 07/21/2014   LEFT  . GERD (gastroesophageal reflux disease)   . Hypercholesteremia   . Hypertension   . Hypothyroidism   . Mediastinitis    s/p drainage  . Myocardial infarction    Inferior STEMI 07/2008 - PCI of prox RCA followed byu CABG x 3 in 9/'09  . Pacemaker   . PAF (paroxysmal atrial fibrillation) ([place])   . Paroxysmal atrial flutter ([place]) 11/26/2014  . Pneumonia   . Shortness of breath   . SSS (sick sinus syndrome) ([place]) 11/26/2014  . Status post hip hemiarthroplasty left hip by Dr Ronnie Derby 08/08/2012    Past Surgical History:  Procedure Laterality Date  . CARPAL TUNNEL RELEASE  08/08/2012   Procedure: CARPAL TUNNEL RELEASE;  Surgeon: Schuyler Amor, MD;  Location: [facility];  Service: Orthopedics;  Laterality: Left;  . CORONARY ARTERY BYPASS GRAFT  2009   LIMA-LAD, SVG-RI, SVG-stented RCA  . ESOPHAGOGASTRODUODENOSCOPY  08/10/2012   Procedure: ESOPHAGOGASTRODUODENOSCOPY (EGD);  Surgeon: Beryle Beams, MD;  Location: [facility] ENDOSCOPY;  Service: Endoscopy;  Laterality: N/A;   . ESOPHAGOSCOPY  08/10/2012   Procedure: ESOPHAGOSCOPY;  Surgeon: [name], MD;  Location: [facility];  Service: ENT;  Laterality: N/A;  . GASTROSTOMY  08/16/2012   Procedure: GASTROSTOMY;  Surgeon: Zenovia Jarred, MD;  Location: [facility];  Service: General;  Laterality: N/A;  . HIP ARTHROPLASTY  08/08/2012   Procedure: ARTHROPLASTY BIPOLAR HIP;  Surgeon: Rudean Haskell, MD;  Location: [place];  Service: Orthopedics;  Laterality: Left;  [place]   . JOINT REPLACEMENT    . LEFT HEART CATHETERIZATION WITH CORONARY ANGIOGRAM N/A 04/16/2014   Procedure: LEFT HEART CATHETERIZATION WITH CORONARY ANGIOGRAM;  Surgeon: Wellington Hampshire, MD;  Location: [facility] [facility] CATH LAB;  Service: Cardiovascular;  Laterality: N/A;  . NM MYOCAR PERF WALL MOTION  08/14/2009   Normal  . PACEMAKER INSERTION  01/06/2009   [employer]  . TOTAL HIP ARTHROPLASTY    . WRIST FRACTURE SURGERY  07/2012   left intra articular  with carpel tunnel    Allergies  Allergen Reactions  . Colestipol     Muscle aches  . Hydrocodone-Acetaminophen Itching    Tolerates tylenol  . Niacin And Related   . Statins     Muscle aches on all she has tried.  She recalls Lipitor,  Crestor, and Livalo but thinks there were others  . Zetia [Ezetimibe]     Muscle aches    Current Outpatient Prescriptions on File Prior to Visit  Medication Sig Dispense Refill  . acetaminophen (TYLENOL) 500 MG tablet Take 1,000 mg by mouth every 6 (six) hours as needed for moderate pain.    Marland Kitchen acidophilus (RISAQUAD) CAPS capsule Take 1 capsule by mouth 2 (two) times daily.    Marland Kitchen albuterol (PROVENTIL HFA;VENTOLIN HFA) 108 (90 BASE) MCG/ACT inhaler Inhale 2 puffs into the lungs every 4 (four) hours as needed for wheezing or shortness of breath. 1 Inhaler 1  . aspirin EC 81 MG tablet Take 81 mg by mouth daily.    . Cholecalciferol (VITAMIN D3 PO) Take 1 tablet by mouth daily.    . cycloSPORINE (RESTASIS) 0.05 % ophthalmic emulsion Place 1 drop into both eyes 2 (two) times daily.     . fluticasone-salmeterol (ADVAIR HFA) 115-21 MCG/ACT inhaler Inhale 2 puffs into the lungs 2 (two) times daily. 1 Inhaler 12  . levothyroxine (SYNTHROID, LEVOTHROID) 50 MCG tablet Take 1 tablet by mouth daily.    Marland Kitchen LORazepam (ATIVAN) 1 MG tablet Take 1 mg by mouth daily as needed for sleep.    . metoprolol succinate (TOPROL XL) 25 MG 24 hr tablet Take 0.5 tablets (12.5 mg total) by mouth daily. (Patient taking differently: Take 25 mg by mouth daily. ) 15 tablet 5  . nitroGLYCERIN (NITROSTAT) 0.4 MG SL tablet Place 1 tablet (0.4 mg total) under the tongue every 5 (five) minutes x 3 doses as needed for chest pain. 25 tablet 12  . omeprazole (PRILOSEC) 40 MG capsule Take 1 capsule (40 mg total) by mouth daily. 90 capsule 3  . ondansetron (ZOFRAN) 4 MG tablet Take 4 mg by mouth every 6 (six) hours as needed for nausea or vomiting.    . ranitidine (ZANTAC) 150 MG tablet Take 1 tablet (150 mg total) by mouth at bedtime. 90 tablet 3  . losartan (COZAAR) 100 MG tablet TAKE ONE TABLET BY MOUTH ONCE DAILY 90 tablet 3   No current facility-administered medications on file prior to visit.        Objective:   Physical Exam Blood pressure 130/74, pulse 72, temperature 97.5 F (36.4 C), height 5\' 5"  (1.651 m), weight 164 lb 1.6 oz (74.4 kg). Alert and oriented. Skin warm and dry. Oral mucosa is moist.   . Sclera anicteric, conjunctivae is pink. Thyroid not enlarged. No cervical lymphadenopathy. Lungs clear. Heart regular rate and rhythm.  Abdomen is soft. Bowel sounds are positive. No hepatomegaly. No abdominal masses felt. No tenderness.  No edema to lower extremities.          Assessment & Plan:  GERD. Rx for Carafate liquid.  OV in 1 year.

## 2016-11-04 ENCOUNTER — Telehealth (INDEPENDENT_AMBULATORY_CARE_PROVIDER_SITE_OTHER): Payer: Self-pay | Admitting: Internal Medicine

## 2016-11-04 NOTE — Telephone Encounter (Signed)
Patient called, stated she has questions about the new medication you gave her.  I called the patient back to let her know you were not in the office today and she was fine with you calling her on Monday.  [phone]

## 2016-11-07 NOTE — Telephone Encounter (Signed)
D/C Carafate. May take a Zantac as needed ata night for GERD

## 2016-11-29 ENCOUNTER — Telehealth: Payer: Self-pay | Admitting: Cardiovascular Disease

## 2016-11-29 NOTE — Telephone Encounter (Signed)
Spoke with Juliann Pulse and informed her that we did receive the remote transmission that it was normal. Juliann Pulse voiced understanding and appreciative of call back.

## 2016-11-29 NOTE — Telephone Encounter (Signed)
Amy [name] states Amy [name]'s monitor was vibrating last night and the patient felt a "shock".  It is ok now, but would like to speak with someone.

## 2016-11-29 NOTE — Telephone Encounter (Signed)
Spoke with Juliann Pulse and informed her that pts device is a pacemaker and cannot shock pt. Juliann Pulse stated that pt wasn't sure if that is what it was or not. Informed Juliann Pulse that pt could send in a remote transmission just to check the pacemaker, Juliann Pulse stated that she would have the patient send in a transmission.

## 2016-12-14 DIAGNOSIS — D0439 Carcinoma in situ of skin of other parts of face: Secondary | ICD-10-CM | POA: Diagnosis not present

## 2016-12-14 DIAGNOSIS — Z85828 Personal history of other malignant neoplasm of skin: Secondary | ICD-10-CM | POA: Diagnosis not present

## 2016-12-14 DIAGNOSIS — D485 Neoplasm of uncertain behavior of skin: Secondary | ICD-10-CM | POA: Diagnosis not present

## 2016-12-14 DIAGNOSIS — L570 Actinic keratosis: Secondary | ICD-10-CM | POA: Diagnosis not present

## 2016-12-14 DIAGNOSIS — L905 Scar conditions and fibrosis of skin: Secondary | ICD-10-CM | POA: Diagnosis not present

## 2016-12-15 ENCOUNTER — Ambulatory Visit (INDEPENDENT_AMBULATORY_CARE_PROVIDER_SITE_OTHER): Payer: Medicare Other | Admitting: *Deleted

## 2016-12-15 DIAGNOSIS — I495 Sick sinus syndrome: Secondary | ICD-10-CM

## 2016-12-15 NOTE — Progress Notes (Signed)
Remote pacemaker transmission.   

## 2016-12-18 LAB — CUP PACEART REMOTE DEVICE CHECK
Brady Statistic AP VP Percent: 0.17 %
Brady Statistic AP VS Percent: 81.41 %
Brady Statistic AS VP Percent: 0.07 %
Brady Statistic AS VS Percent: 18.34 %
Brady Statistic RV Percent Paced: 0.23 %
Date Time Interrogation Session: 20180215142739
Implantable Lead Implant Date: 20100309
Implantable Lead Location: 753859
Implantable Lead Model: 5076
Implantable Lead Model: 5076
Implantable Pulse Generator Implant Date: 20100309
Lead Channel Impedance Value: 448 Ohm
Lead Channel Impedance Value: 688 Ohm
Lead Channel Sensing Intrinsic Amplitude: 17.495 mV
Lead Channel Sensing Intrinsic Amplitude: 2.251 mV
Lead Channel Setting Pacing Amplitude: 2.5 V
Lead Channel Setting Pacing Pulse Width: 0.8 ms
MDC IDC LEAD IMPLANT DT: 20100309
MDC IDC LEAD LOCATION: 753860
MDC IDC MSMT BATTERY VOLTAGE: 2.93 V
MDC IDC SET LEADCHNL RA PACING AMPLITUDE: 2 V
MDC IDC SET LEADCHNL RV SENSING SENSITIVITY: 0.9 mV
MDC IDC STAT BRADY RA PERCENT PACED: 81.42 %

## 2016-12-20 ENCOUNTER — Encounter: Payer: Self-pay | Admitting: Cardiology

## 2016-12-22 DIAGNOSIS — C44329 Squamous cell carcinoma of skin of other parts of face: Secondary | ICD-10-CM | POA: Diagnosis not present

## 2017-01-02 ENCOUNTER — Other Ambulatory Visit: Payer: Self-pay | Admitting: Cardiovascular Disease

## 2017-01-02 ENCOUNTER — Other Ambulatory Visit (HOSPITAL_COMMUNITY): Payer: Self-pay | Admitting: Internal Medicine

## 2017-01-02 DIAGNOSIS — Z1231 Encounter for screening mammogram for malignant neoplasm of breast: Secondary | ICD-10-CM

## 2017-01-12 ENCOUNTER — Ambulatory Visit (HOSPITAL_COMMUNITY)
Admission: RE | Admit: 2017-01-12 | Discharge: 2017-01-12 | Disposition: A | Discharge status: Home / Self Care | Payer: Medicare Other | Source: Ambulatory Visit | Attending: Internal Medicine | Admitting: Internal Medicine

## 2017-01-12 DIAGNOSIS — Z1231 Encounter for screening mammogram for malignant neoplasm of breast: Secondary | ICD-10-CM | POA: Insufficient documentation

## 2017-01-16 ENCOUNTER — Telehealth (INDEPENDENT_AMBULATORY_CARE_PROVIDER_SITE_OTHER): Payer: Self-pay | Admitting: Internal Medicine

## 2017-01-16 NOTE — Telephone Encounter (Signed)
Patient called requesting that Terri call her back regarding a question about drinking soda.  [phone]

## 2017-01-16 NOTE — Telephone Encounter (Signed)
Do not use vinegar

## 2017-02-06 DIAGNOSIS — E039 Hypothyroidism, unspecified: Secondary | ICD-10-CM | POA: Diagnosis not present

## 2017-03-16 ENCOUNTER — Ambulatory Visit (INDEPENDENT_AMBULATORY_CARE_PROVIDER_SITE_OTHER): Payer: Medicare Other | Admitting: *Deleted

## 2017-03-16 DIAGNOSIS — I495 Sick sinus syndrome: Secondary | ICD-10-CM | POA: Diagnosis not present

## 2017-03-16 LAB — CUP PACEART REMOTE DEVICE CHECK
Battery Voltage: 2.92 V
Brady Statistic AS VP Percent: 0.11 %
Brady Statistic RA Percent Paced: 78.76 %
Date Time Interrogation Session: 20180517121718
Implantable Lead Implant Date: 20100309
Implantable Lead Location: 753859
Implantable Pulse Generator Implant Date: 20100309
Lead Channel Impedance Value: 448 Ohm
Lead Channel Sensing Intrinsic Amplitude: 18.524 mV
Lead Channel Setting Pacing Amplitude: 2 V
Lead Channel Setting Pacing Amplitude: 2.5 V
Lead Channel Setting Pacing Pulse Width: 0.8 ms
Lead Channel Setting Sensing Sensitivity: 0.9 mV
MDC IDC LEAD IMPLANT DT: 20100309
MDC IDC LEAD LOCATION: 753860
MDC IDC MSMT LEADCHNL RA SENSING INTR AMPL: 3.161 mV
MDC IDC MSMT LEADCHNL RV IMPEDANCE VALUE: 704 Ohm
MDC IDC STAT BRADY AP VP PERCENT: 0.26 %
MDC IDC STAT BRADY AP VS PERCENT: 78.76 %
MDC IDC STAT BRADY AS VS PERCENT: 20.87 %
MDC IDC STAT BRADY RV PERCENT PACED: 0.38 %

## 2017-03-16 NOTE — Progress Notes (Signed)
Remote pacemaker transmission.   

## 2017-03-17 ENCOUNTER — Encounter: Payer: Self-pay | Admitting: Cardiology

## 2017-03-22 DIAGNOSIS — N390 Urinary tract infection, site not specified: Secondary | ICD-10-CM | POA: Diagnosis not present

## 2017-03-22 DIAGNOSIS — Z6826 Body mass index (BMI) 26.0-26.9, adult: Secondary | ICD-10-CM | POA: Diagnosis not present

## 2017-04-13 DIAGNOSIS — H04123 Dry eye syndrome of bilateral lacrimal glands: Secondary | ICD-10-CM | POA: Diagnosis not present

## 2017-04-13 DIAGNOSIS — H40013 Open angle with borderline findings, low risk, bilateral: Secondary | ICD-10-CM | POA: Diagnosis not present

## 2017-04-13 DIAGNOSIS — H16103 Unspecified superficial keratitis, bilateral: Secondary | ICD-10-CM | POA: Diagnosis not present

## 2017-05-13 ENCOUNTER — Emergency Department (HOSPITAL_COMMUNITY): Payer: Medicare Other

## 2017-05-13 ENCOUNTER — Emergency Department (HOSPITAL_COMMUNITY)
Admission: EM | Admit: 2017-05-13 | Discharge: 2017-05-13 | Disposition: A | Discharge status: Home / Self Care | Payer: Medicare Other | Attending: Emergency Medicine | Admitting: Emergency Medicine

## 2017-05-13 ENCOUNTER — Encounter (HOSPITAL_COMMUNITY): Payer: Self-pay | Admitting: Emergency Medicine

## 2017-05-13 DIAGNOSIS — N183 Chronic kidney disease, stage 3 unspecified: Secondary | ICD-10-CM | POA: Diagnosis present

## 2017-05-13 DIAGNOSIS — Z951 Presence of aortocoronary bypass graft: Secondary | ICD-10-CM | POA: Diagnosis not present

## 2017-05-13 DIAGNOSIS — Z950 Presence of cardiac pacemaker: Secondary | ICD-10-CM | POA: Diagnosis not present

## 2017-05-13 DIAGNOSIS — I480 Paroxysmal atrial fibrillation: Secondary | ICD-10-CM | POA: Diagnosis not present

## 2017-05-13 DIAGNOSIS — M6281 Muscle weakness (generalized): Secondary | ICD-10-CM | POA: Diagnosis present

## 2017-05-13 DIAGNOSIS — Z7982 Long term (current) use of aspirin: Secondary | ICD-10-CM | POA: Insufficient documentation

## 2017-05-13 DIAGNOSIS — R531 Weakness: Secondary | ICD-10-CM

## 2017-05-13 DIAGNOSIS — I252 Old myocardial infarction: Secondary | ICD-10-CM | POA: Diagnosis not present

## 2017-05-13 DIAGNOSIS — K219 Gastro-esophageal reflux disease without esophagitis: Secondary | ICD-10-CM | POA: Diagnosis present

## 2017-05-13 DIAGNOSIS — I129 Hypertensive chronic kidney disease with stage 1 through stage 4 chronic kidney disease, or unspecified chronic kidney disease: Secondary | ICD-10-CM | POA: Diagnosis not present

## 2017-05-13 DIAGNOSIS — J45909 Unspecified asthma, uncomplicated: Secondary | ICD-10-CM | POA: Diagnosis not present

## 2017-05-13 DIAGNOSIS — N1832 Chronic kidney disease, stage 3b: Secondary | ICD-10-CM | POA: Diagnosis present

## 2017-05-13 DIAGNOSIS — I10 Essential (primary) hypertension: Secondary | ICD-10-CM | POA: Diagnosis not present

## 2017-05-13 DIAGNOSIS — Z79899 Other long term (current) drug therapy: Secondary | ICD-10-CM | POA: Insufficient documentation

## 2017-05-13 LAB — COMPREHENSIVE METABOLIC PANEL
ALT: 13 U/L — AB (ref 14–54)
AST: 31 U/L (ref 15–41)
Albumin: 3.8 g/dL (ref 3.5–5.0)
Alkaline Phosphatase: 82 U/L (ref 38–126)
Anion gap: 10 (ref 5–15)
BUN: 17 mg/dL (ref 6–20)
CALCIUM: 9.7 mg/dL (ref 8.9–10.3)
CHLORIDE: 102 mmol/L (ref 101–111)
CO2: 28 mmol/L (ref 22–32)
CREATININE: 1.36 mg/dL — AB (ref 0.44–1.00)
GFR, EST AFRICAN AMERICAN: 38 mL/min — AB (ref >60)
GFR, EST NON AFRICAN AMERICAN: 32 mL/min — AB (ref >60)
Glucose, Bld: 106 mg/dL — ABNORMAL HIGH (ref 65–99)
Potassium: 4.8 mmol/L (ref 3.5–5.1)
SODIUM: 140 mmol/L (ref 135–145)
Total Bilirubin: 1.2 mg/dL (ref 0.3–1.2)
Total Protein: 7.1 g/dL (ref 6.5–8.1)

## 2017-05-13 LAB — URINALYSIS, ROUTINE W REFLEX MICROSCOPIC
BILIRUBIN URINE: NEGATIVE
Glucose, UA: NEGATIVE mg/dL
Hgb urine dipstick: NEGATIVE
KETONES UR: NEGATIVE mg/dL
LEUKOCYTES UA: NEGATIVE
NITRITE: NEGATIVE
Protein, ur: NEGATIVE mg/dL
SPECIFIC GRAVITY, URINE: 1.01 (ref 1.005–1.030)
pH: 6 (ref 5.0–8.0)

## 2017-05-13 LAB — CBC WITH DIFFERENTIAL/PLATELET
Basophils Absolute: 0 10*3/uL (ref 0.0–0.1)
Basophils Relative: 1 %
EOS ABS: 0.2 10*3/uL (ref 0.0–0.7)
EOS PCT: 3 %
HCT: 41.8 % (ref 36.0–46.0)
Hemoglobin: 13.9 g/dL (ref 12.0–15.0)
LYMPHS ABS: 2.1 10*3/uL (ref 0.7–4.0)
LYMPHS PCT: 32 %
MCH: 29.9 pg (ref 26.0–34.0)
MCHC: 33.3 g/dL (ref 30.0–36.0)
MCV: 89.9 fL (ref 78.0–100.0)
MONO ABS: 0.4 10*3/uL (ref 0.1–1.0)
MONOS PCT: 6 %
Neutro Abs: 3.8 10*3/uL (ref 1.7–7.7)
Neutrophils Relative %: 58 %
PLATELETS: 190 10*3/uL (ref 150–400)
RBC: 4.65 MIL/uL (ref 3.87–5.11)
RDW: 13.7 % (ref 11.5–15.5)
WBC: 6.5 10*3/uL (ref 4.0–10.5)

## 2017-05-13 LAB — MAGNESIUM: MAGNESIUM: 1.8 mg/dL (ref 1.7–2.4)

## 2017-05-13 LAB — TROPONIN I

## 2017-05-13 MED ORDER — SODIUM CHLORIDE 0.9 % IV BOLUS (SEPSIS)
500.0000 mL | Freq: Once | INTRAVENOUS | Status: AC
  Administered 2017-05-13: 500 mL via INTRAVENOUS

## 2017-05-13 NOTE — ED Provider Notes (Signed)
[place] [facility] DEPT Provider Note   CSN: [id number] Arrival date & time: 05/13/17  0932     History   Chief Complaint Chief Complaint  Patient presents with  . Weakness    HPI Amy [name] is a 83 y.o. female.  HPI  The patient is a 83 year old female who presents from home with a complaint of generalized weakness, she reports that this has been going on for the last few days, she has had a couple of weeks of feeling not up to her self with decreased energy but over the last couple of days has noticed an increase in weakness of her bilateral hands stating that yesterday she dropped her glass 3 times, today she noticed that her weeks were significantly weak and she had difficulty walking. She does not describe any laterality but states it is more bilateral. She has had intermittent chest pain and shortness of breath as well with some radiation to the right shoulder. She denies a headache or visual changes and denies any numbness of her legs or arms. She does take medications including a baby aspirin daily. She has been known to have a bypass operation of her heart however in 2010 she had a Myoview which was nonischemic. The patient is chest pain-free at this time and denies any swelling of her legs, denies any new medications in the last month, denies any trauma.  Past Medical History:  Diagnosis Date  . Anxiety   . Arthritis   . Asthma   . CAD in native artery    s/p CABG x 3; Last Myoview in 07/2009 - non-ischemic; Echo 03/2012  Aortic Sclerosis with normal EF.  . CKD (chronic kidney disease), stage III   . DVT (deep vein thrombosis) in pregnancy ([place])   . Esophageal perforation    9/13  . Fall 07/21/2014   FALL WITH INJURY  . Fracture of head of humerus   . Fracture of hip ([place]) 07/21/2014   LEFT  . GERD (gastroesophageal reflux disease)   . Hypercholesteremia   . Hypertension   . Hypothyroidism   . Mediastinitis    s/p drainage  . Myocardial infarction [facility])    Inferior STEMI 07/2008 - PCI of prox RCA followed byu CABG x 3 in 9/'09  . Pacemaker   . PAF (paroxysmal atrial fibrillation) ([place])   . Paroxysmal atrial flutter ([place]) 11/26/2014  . Pneumonia   . Shortness of breath   . SSS (sick sinus syndrome) ([place]) 11/26/2014  . Status post hip hemiarthroplasty left hip by Dr Ronnie Derby 08/08/2012    Patient Active Problem List   Diagnosis Date Noted  . Chest pain 05/09/2015  . SSS (sick sinus syndrome) ([place]) 11/26/2014  . Paroxysmal atrial flutter ([place]) 11/26/2014  . Abnormal results of liver function studies 10/01/2014  . Anxiety state 08/17/2014  . Cough 08/17/2014  . Fracture of humeral head, left, closed 07/22/2014  . Multiple pelvic fractures 07/22/2014  . Fall 07/21/2014  . GERD (gastroesophageal reflux disease) 06/09/2014  . Unstable angina ([place]) 04/15/2014  . C. difficile colitis 12/11/2012  . Abdominal pain, acute 12/06/2012  . Sigmoid diverticulitis 12/06/2012  . UTI (lower urinary tract infection) 12/06/2012  . S/P gastrostomy tube (G tube) placement, follow-up exam 10/17/2012  . Acute respiratory failure with hypoxia ([place]) 08/14/2012  . Pulmonary edema, noncardiac 08/14/2012  . Acute blood loss anemia 08/12/2012  . Mediastinitis due to esophageal perforation-s/p Incision and drainage, right neck and mediastinum, transcervical approach. Cervical esophagoscopy 08/11/2012  . S/P right  total hip arthroplasty by Dr Noemi Chapel in 2009 08/11/2012  . Odynophagia due to new traumatic peri-op esophageal perforation 08/09/2012  . Pre-operative clearance 08/08/2012  . Fracture of left hip after fall at home. 08/08/2012  . Aortic valve sclerosis, no stenosis 08/08/2012  . Carotid bruit present bilat, dopplers OK 6/13 08/08/2012  . History of UTI- recent completion 10 days Cipro 08/08/2012  . Hx of dizziness 08/08/2012  . Chronic renal insufficiency, stage III (moderate), SCr 1.4 Sept 2013 08/08/2012  . PAF/SSS 08/08/2012  . Pacemaker March 2010 (MDT)  08/08/2012  . DVT after previous hip surgery 08/08/2012  . Status post hip hemiarthroplasty left hip by Dr Ronnie Derby 08/08/2012 08/08/2012  . Closed fracture of left distal radius s/p ORIF by Dr Burney Gauze 08/08/2012 08/08/2012  . Radial head fracture, closed, Lt 08/07/2012  . Hypertension 08/07/2012  . Hyperlipidemia, statin intol. 08/07/2012  . CAD, CABG X 3 10/09. Myoview low risk 10/10. EF >55% 2D 6/13. 08/07/2012  . DYSPNEA 07/28/2009    Past Surgical History:  Procedure Laterality Date  . CARPAL TUNNEL RELEASE  08/08/2012   Procedure: CARPAL TUNNEL RELEASE;  Surgeon: Schuyler Amor, MD;  Location: [place];  Service: Orthopedics;  Laterality: Left;  . CORONARY ARTERY BYPASS GRAFT  2009   LIMA-LAD, SVG-RI, SVG-stented RCA  . ESOPHAGOGASTRODUODENOSCOPY  08/10/2012   Procedure: ESOPHAGOGASTRODUODENOSCOPY (EGD);  Surgeon: Beryle Beams, MD;  Location[facility] ENDOSCOPY;  Service: Endoscopy;  Laterality: N/A;  . ESOPHAGOSCOPY  08/10/2012   Procedure: ESOPHAGOSCOPY;  Surgeon: [name], MD;  Location: [place];  Service: ENT;  Laterality: N/A;  . GASTROSTOMY  08/16/2012   Procedure: GASTROSTOMY;  Surgeon: Zenovia Jarred, MD;  Location: [place];  Service: General;  Laterality: N/A;  . HIP ARTHROPLASTY  08/08/2012   Procedure: ARTHROPLASTY BIPOLAR HIP;  Surgeon: Rudean Haskell, MD;  Location: [place];  Service: Orthopedics;  Laterality: Left;  [place]   . JOINT REPLACEMENT    . LEFT HEART CATHETERIZATION WITH CORONARY ANGIOGRAM N/A 04/16/2014   Procedure: LEFT HEART CATHETERIZATION WITH CORONARY ANGIOGRAM;  Surgeon: Wellington Hampshire, MD;  Location: [place] CATH LAB;  Service: Cardiovascular;  Laterality: N/A;  . NM MYOCAR PERF WALL MOTION  08/14/2009   Normal  . PACEMAKER INSERTION  01/06/2009   [employer]  . TOTAL HIP ARTHROPLASTY    . WRIST FRACTURE SURGERY  07/2012   left intra articular  with carpel tunnel    OB History    No data available       Home Medications    Prior to Admission  medications   Medication Sig Start Date End Date Taking? Authorizing Provider  acetaminophen (TYLENOL) 500 MG tablet Take 1,000 mg by mouth every 6 (six) hours as needed for moderate pain.   Yes [provider]  albuterol (PROVENTIL HFA;VENTOLIN HFA) 108 (90 BASE) MCG/ACT inhaler Inhale 2 puffs into the lungs every 4 (four) hours as needed for wheezing or shortness of breath. 05/27/14  Yes Croitoru, Mihai, MD  aspirin EC 81 MG tablet Take 81 mg by mouth daily.   Yes [provider]  Cholecalciferol (VITAMIN D3 PO) Take 1 tablet by mouth daily.   Yes [provider]  cycloSPORINE (RESTASIS) 0.05 % ophthalmic emulsion Place 1 drop into both eyes 2 (two) times daily.   Yes [provider]  docusate sodium (COLACE) 100 MG capsule Take 100 mg by mouth 2 (two) times daily.   Yes [provider]  fluticasone-salmeterol (ADVAIR HFA) 115-21 MCG/ACT inhaler  Inhale 2 puffs into the lungs 2 (two) times daily. 05/02/15  Yes Daleen Bo, MD  levothyroxine (SYNTHROID, LEVOTHROID) 50 MCG tablet Take 1 tablet by mouth daily. 06/20/16  Yes [provider]  LORazepam (ATIVAN) 1 MG tablet Take 1 mg by mouth daily as needed for sleep.   Yes [provider]  losartan (COZAAR) 100 MG tablet TAKE ONE TABLET BY MOUTH ONCE DAILY 08/19/16  Yes Croitoru, Mihai, MD  metoprolol succinate (TOPROL-XL) 25 MG 24 hr tablet TAKE ONE-HALF TABLET BY MOUTH ONCE DAILY 01/02/17  Yes Croitoru, Mihai, MD  nitroGLYCERIN (NITROSTAT) 0.4 MG SL tablet Place 1 tablet (0.4 mg total) under the tongue every 5 (five) minutes x 3 doses as needed for chest pain. 04/17/14  Yes Barrett, Evelene Croon, PA-C  omeprazole (PRILOSEC) 40 MG capsule Take 1 capsule (40 mg total) by mouth daily. 07/07/16  Yes Setzer, Terri L, NP  ondansetron (ZOFRAN) 4 MG tablet Take 4 mg by mouth every 6 (six) hours as needed for nausea or vomiting.   Yes [provider]  ranitidine (ZANTAC) 150 MG tablet Take 1 tablet  (150 mg total) by mouth at bedtime. 07/07/16  Yes Setzer, Rona Ravens, NP    Family History Family History  Problem Relation Age of [place]set  . Stroke Father     Social History Social History  Substance Use Topics  . Smoking status: Never Smoker  . Smokeless tobacco: Never Used  . Alcohol use No     Allergies   Colestipol; Hydrocodone-acetaminophen; Niacin and related; Statins; and Zetia [ezetimibe]   Review of Systems Review of Systems  All other systems reviewed and are negative.   Physical Exam Updated Vital Signs BP (!) 154/65   Pulse 69   Temp 98.1 F (36.7 C)   Resp 16   Ht 5\' 4"  (1.626 m)   Wt 72.6 kg (160 lb)   SpO2 96%   BMI 27.46 kg/m   Physical Exam  Constitutional: She appears well-developed and well-nourished. No distress.  HENT:  Head: Normocephalic and atraumatic.  Mouth/Throat: Oropharynx is clear and moist. No oropharyngeal exudate.  Eyes: Pupils are equal, round, and reactive to light. Conjunctivae and EOM are normal. Right eye exhibits no discharge. Left eye exhibits no discharge. No scleral icterus.  Neck: Normal range of motion. Neck supple. No JVD present. No thyromegaly present.  Cardiovascular: Normal rate, normal heart sounds and intact distal pulses.  Exam reveals no gallop and no friction rub.   No murmur heard. Slightly irregular rhythm, underlying paced rhythm seen, normal pulses, no JVD  Pulmonary/Chest: Effort normal and breath sounds normal. No respiratory distress. She has no wheezes. She has no rales.  Abdominal: Soft. Bowel sounds are normal. She exhibits no distension and no mass. There is no tenderness.  Musculoskeletal: Normal range of motion. She exhibits no edema or tenderness.  Lymphadenopathy:    She has no cervical adenopathy.  Neurological: She is alert. Coordination normal.  Speech is clear, cranial nerves III through XII are normal, there is no weakness in the upper extremities with normal grips, normal strength and no  pronator drift. She does have weakness in the lower extremities which seems bilateral but she is able to straight leg raise with 4 out of 5 strength bilaterally.  Skin: Skin is warm and dry. No rash noted. No erythema.  Psychiatric: She has a normal mood and affect. Her behavior is normal.  Nursing note and vitals reviewed.    ED Treatments / Results  Labs (all labs ordered are listed, but only abnormal results are displayed) Labs Reviewed  COMPREHENSIVE METABOLIC PANEL - Abnormal; Notable for the following:       Result Value   Glucose, Bld 106 (*)    Creatinine, Ser 1.36 (*)    ALT 13 (*)    GFR calc non Af Amer 32 (*)    GFR calc Af Amer 38 (*)    All other components within normal limits  CBC WITH DIFFERENTIAL/PLATELET  URINALYSIS, ROUTINE W REFLEX MICROSCOPIC  MAGNESIUM  TROPONIN I   EKG  EKG Interpretation  Date/Time:  Saturday May 13 2017 09:51:38 EDT Ventricular Rate:  72 PR Interval:    QRS Duration: 88 QT Interval:  386 QTC Calculation: 423 R Axis:   -59 Text Interpretation:  Atrial-paced rhythm LAD, consider left anterior fascicular block Anterior infarct, old Since last tracing pacing now seen Confirmed by Noemi Chapel [phone]) on 05/13/2017 9:55:43 AM       Radiology Ct Head Wo Contrast  Result Date: 05/13/2017 CLINICAL DATA:  Generalized weakness, hypertension, dehydration EXAM: CT HEAD WITHOUT CONTRAST TECHNIQUE: Contiguous axial images were obtained from the base of the skull through the vertex without intravenous contrast. COMPARISON:  07/21/2014 FINDINGS: Brain: Similar brain atrophy pattern and chronic white matter microvascular ischemic changes throughout both cerebral hemispheres. No acute intracranial hemorrhage, mass lesion, definite infarction, midline shift, herniation, hydrocephalus, or extra-axial fluid collection. Normal gray-white matter differentiation. Cisterns are patent. Cerebellar atrophy as well. Vascular: Atherosclerotic changes.  No  hyperdense vessels. Skull: Normal. Negative for fracture or focal lesion. Sinuses/Orbits: No acute finding. Other: None. IMPRESSION: Brain atrophy and chronic white matter microvascular ischemic changes. No acute intracranial process by noncontrast CT. Electronically Signed   By: Jerilynn Mages.  Shick M.D.   On: 05/13/2017 11:18    Procedures Procedures (including critical care time)  Medications Ordered in ED Medications  sodium chloride 0.9 % bolus 500 mL (0 mLs Intravenous Stopped 05/13/17 1149)     Initial Impression / Assessment and Plan / ED Course  I have reviewed the triage vital signs and the nursing notes.  Pertinent labs & imaging results that were available during my care of the patient were reviewed by me and considered in my medical decision making (see chart for details).    The patient has had some generalized weakness, she has been unable to be seen at her doctor's office but has been sent here for further evaluation. Her vital signs are rather unremarkable with only mild hypertension, I would consider stroke, electrolyte disturbance, dehydration, urinary tract infection, labs, CT scan ordered. EKG shows paced rhythm, troponin pending as well. Anticipate admission due to intermittent chest pain and weakness  No acute findings on labs or CT imaging D/w Dr. Roderic Palau for observation admission  Final Clinical Impressions(s) / ED Diagnoses   Final diagnoses:  Generalized weakness    New Prescriptions New Prescriptions   No medications on file     Noemi Chapel, MD 05/13/17 1251

## 2017-05-13 NOTE — ED Triage Notes (Signed)
Pt c/o generalized weakness x 1 week. Pt sent by pcp for htn and possible dehydration.

## 2017-05-13 NOTE — Consult Note (Signed)
Medical Consultation   Amy M Gottlieb  RKY:[id number]  DOB: [birth year]  DOA: 05/13/2017  PCP: Celene Squibb, MD   Outpatient Specialists:    Requesting physician: Dr. Sabra Heck, ER physician  Reason for consultation: Possible admission for chest pain and generalized weakness   History of Present Illness: Amy [name] is an 83 y.o. female with a history of hypertension, hypothyroidism, sick sinus syndrome status post pacemaker, GERD presents to the hospital with several complaints. She reports that for the past week, she's been noticing increasing weakness in her lower extremities. Every time she stands, she feels that her knees will buckle. She walks without a walker. Over the past 2 days, she feels that she's been dropping things out of her hands. She's had no trouble with her speech. No changes in vision. No trouble swallowing. No difficulty breathing. She has been coughing recently. She's had no vomiting or diarrhea. No dysuria or fever. She's not had any numbness. She initially complained of left-sided chest pain, and further clarified that it felt more to be like pain in her left breast. She's also had pain in her knees, other joints. She is not having any chest discomfort at this time.  She was evaluated in the emergency room where basic labs were found to be unremarkable. CT scan of the head did not show any new findings. Urinalysis did not show any signs of infection. EKG showed paced rhythm and troponin was negative.  Review of Systems:  ROS As per HPI otherwise 10 point review of systems negative.     Past Medical History: Past Medical History:  Diagnosis Date  . Anxiety   . Arthritis   . Asthma   . CAD in native artery    s/p CABG x 3; Last Myoview in 07/2009 - non-ischemic; Echo 03/2012  Aortic Sclerosis with normal EF.  . CKD (chronic kidney disease), stage III   . DVT (deep vein thrombosis) in pregnancy ([place])   . Esophageal perforation    9/13  . Fall  07/21/2014   FALL WITH INJURY  . Fracture of head of humerus   . Fracture of hip ([place]) 07/21/2014   LEFT  . GERD (gastroesophageal reflux disease)   . Hypercholesteremia   . Hypertension   . Hypothyroidism   . Mediastinitis    s/p drainage  . Myocardial infarction [facility])    Inferior STEMI 07/2008 - PCI of prox RCA followed byu CABG x 3 in 9/'09  . Pacemaker   . PAF (paroxysmal atrial fibrillation) ([place])   . Paroxysmal atrial flutter ([place]) 11/26/2014  . Pneumonia   . Shortness of breath   . SSS (sick sinus syndrome) ([place]) 11/26/2014  . Status post hip hemiarthroplasty left hip by Dr Ronnie Derby 08/08/2012    Past Surgical History: Past Surgical History:  Procedure Laterality Date  . CARPAL TUNNEL RELEASE  08/08/2012   Procedure: CARPAL TUNNEL RELEASE;  Surgeon: Schuyler Amor, MD;  Location: [facility];  Service: Orthopedics;  Laterality: Left;  . CORONARY ARTERY BYPASS GRAFT  2009   LIMA-LAD, SVG-RI, SVG-stented RCA  . ESOPHAGOGASTRODUODENOSCOPY  08/10/2012   Procedure: ESOPHAGOGASTRODUODENOSCOPY (EGD);  Surgeon: Beryle Beams, MD;  Location: [facility] ENDOSCOPY;  Service: Endoscopy;  Laterality: N/A;  . ESOPHAGOSCOPY  08/10/2012   Procedure: ESOPHAGOSCOPY;  Surgeon: [name], MD;  Location: [place];  Service: ENT;  Laterality: N/A;  . GASTROSTOMY  08/16/2012   Procedure: GASTROSTOMY;  Surgeon: Zenovia Jarred, MD;  Location: [facility];  Service: General;  Laterality: N/A;  . HIP ARTHROPLASTY  08/08/2012   Procedure: ARTHROPLASTY BIPOLAR HIP;  Surgeon: Rudean Haskell, MD;  Location: [place] [facility];  Service: Orthopedics;  Laterality: Left;  [place]   . JOINT REPLACEMENT    . LEFT HEART CATHETERIZATION WITH CORONARY ANGIOGRAM N/A 04/16/2014   Procedure: LEFT HEART CATHETERIZATION WITH CORONARY ANGIOGRAM;  Surgeon: Wellington Hampshire, MD;  Location: [facility] CATH LAB;  Service: Cardiovascular;  Laterality: N/A;  . NM MYOCAR PERF WALL MOTION  08/14/2009   Normal  . PACEMAKER INSERTION  01/06/2009   [employer]  .  TOTAL HIP ARTHROPLASTY    . WRIST FRACTURE SURGERY  07/2012   left intra articular  with carpel tunnel     Allergies:   Allergies  Allergen Reactions  . Colestipol     Muscle aches  . Hydrocodone-Acetaminophen Itching    Tolerates tylenol  . Niacin And Related   . Statins     Muscle aches on all she has tried.  She recalls Lipitor, Crestor, and Livalo but thinks there were others  . Zetia [Ezetimibe]     Muscle aches     Social History:  reports that she has never smoked. She has never used smokeless tobacco. She reports that she does not drink alcohol or use drugs.   Family History: Family History  Problem Relation Age of Onset  . Stroke Father       Physical Exam: Vitals:   05/13/17 1200 05/13/17 1230 05/13/17 1300 05/13/17 1330  BP: (!) 165/64 (!) 166/70 (!) 160/65 (!) 160/64  Pulse: 69 69 70 69  Resp: 16 18 (!) 30 16  Temp:      SpO2: 98% 96% 95% 97%  Weight:      Height:        Constitutional: Alert and awake, oriented x3, not in any acute distress. Eyes: PERLA, EOMI, irises appear normal, anicteric sclera,  ENMT: external ears and nose appear normal, normal hearing             Lips appears normal, oropharynx mucosa, tongue, posterior pharynx appear normal  Neck: neck appears normal, no masses, normal ROM, no thyromegaly, no JVD  CVS: S1-S2 clear, no murmur rubs or gallops, no LE edema, normal pedal pulses  Respiratory:  clear to auscultation bilaterally, no wheezing, rales or rhonchi. Respiratory effort normal. No accessory muscle use.  Abdomen: soft nontender, nondistended, normal bowel sounds, no hepatosplenomegaly, no hernias  Musculoskeletal: : no cyanosis, clubbing or edema noted bilaterally                        Neuro: Cranial nerves II-XII intact,Strength is 5 out of 5 in upper extremities bilaterally. 3-4 out of 5 in lower extremities bilaterally. Patellar reflexes are intact bilaterally. Plantars are downgoing. Hoffmann sign negative  bilaterally. Psych: judgement and insight appear normal, stable mood and affect, mental status Skin: no rashes or lesions or ulcers, no induration or nodules    Data reviewed:  I have personally reviewed following labs and imaging studies Labs:  CBC:  Recent Labs Lab 05/13/17 1037  WBC 6.5  NEUTROABS 3.8  HGB 13.9  HCT 41.8  MCV 89.9  PLT 846    Basic Metabolic Panel:  Recent Labs Lab 05/13/17 1037  NA 140  K 4.8  CL 102  CO2 28  GLUCOSE 106*  BUN 17  CREATININE 1.36*  CALCIUM 9.7  MG  1.8   GFR Estimated Creatinine Clearance: 25.3 mL/min (A) (by C-G formula based on SCr of 1.36 mg/dL (H)). Liver Function Tests:  Recent Labs Lab 05/13/17 1037  AST 31  ALT 13*  ALKPHOS 82  BILITOT 1.2  PROT 7.1  ALBUMIN 3.8   No results for input(s): LIPASE, AMYLASE in the last 168 hours. No results for input(s): AMMONIA in the last 168 hours. Coagulation profile No results for input(s): INR, PROTIME in the last 168 hours.  Cardiac Enzymes:  Recent Labs Lab 05/13/17 1037  TROPONINI <0.03   BNP: Invalid input(s): POCBNP CBG: No results for input(s): GLUCAP in the last 168 hours. D-Dimer No results for input(s): DDIMER in the last 72 hours. Hgb A1c No results for input(s): HGBA1C in the last 72 hours. Lipid Profile No results for input(s): CHOL, HDL, LDLCALC, TRIG, CHOLHDL, LDLDIRECT in the last 72 hours. Thyroid function studies No results for input(s): TSH, T4TOTAL, T3FREE, THYROIDAB in the last 72 hours.  Invalid input(s): FREET3 Anemia work up No results for input(s): VITAMINB12, FOLATE, FERRITIN, TIBC, IRON, RETICCTPCT in the last 72 hours. Urinalysis    Component Value Date/Time   COLORURINE YELLOW 05/13/2017 1145   APPEARANCEUR CLEAR 05/13/2017 1145   LABSPEC 1.010 05/13/2017 1145   PHURINE 6.0 05/13/2017 1145   GLUCOSEU NEGATIVE 05/13/2017 1145   HGBUR NEGATIVE 05/13/2017 1145   BILIRUBINUR NEGATIVE 05/13/2017 1145   [place]  05/13/2017 1145   PROTEINUR NEGATIVE 05/13/2017 1145   UROBILINOGEN 0.2 07/31/2014 1641   NITRITE NEGATIVE 05/13/2017 1145   LEUKOCYTESUR NEGATIVE 05/13/2017 1145     Microbiology No results found for this or any previous visit (from the past 240 hour(s)).     Inpatient Medications:   Scheduled Meds: Continuous Infusions:   Radiological Exams on Admission: Ct Head Wo Contrast  Result Date: 05/13/2017 CLINICAL DATA:  Generalized weakness, hypertension, dehydration EXAM: CT HEAD WITHOUT CONTRAST TECHNIQUE: Contiguous axial images were obtained from the base of the skull through the vertex without intravenous contrast. COMPARISON:  07/21/2014 FINDINGS: Brain: Similar brain atrophy pattern and chronic white matter microvascular ischemic changes throughout both cerebral hemispheres. No acute intracranial hemorrhage, mass lesion, definite infarction, midline shift, herniation, hydrocephalus, or extra-axial fluid collection. Normal gray-white matter differentiation. Cisterns are patent. Cerebellar atrophy as well. Vascular: Atherosclerotic changes.  No hyperdense vessels. Skull: Normal. Negative for fracture or focal lesion. Sinuses/Orbits: No acute finding. Other: None. IMPRESSION: Brain atrophy and chronic white matter microvascular ischemic changes. No acute intracranial process by noncontrast CT. Electronically Signed   By: Jerilynn Mages.  Shick M.D.   On: 05/13/2017 11:18    Impression/Recommendations Active Problems:   Hypertension   CKD (chronic kidney disease), stage III   PAF/SSS   Pacemaker March 2010 (MDT)   GERD (gastroesophageal reflux disease)   Generalized weakness  1. Generalized weakness. Patient did complain of weakness in her lower extremities and a feeling as though they would give out. She also complained of weakness in her arms. CT scan of the head was found to be unremarkable. She cannot have MRI due to presence of pacemaker. On exam, she has 5 out of 5 strength in her upper  extremities bilaterally with good grip bilaterally. She also has 3-45 strength in her lower extremities bilaterally with reflexes intact. She does not have any focal deficits at this point. She does not have any sensory deficits. Based on her exam, is unlikely that she's had any signs of stroke. Other conditions such as GBS would be less likely  due to absence of areflexia. The patient is able to ambulate with a walker in the emergency room. She feels that she can manage at home. She does not appear to have any underlying infections. It is possible that her weakness is related to generalized deconditioning related to her age. She will be set up with home health physical therapy. She's been advised to keep herself well-hydrated. 2. Chest pain. Patient had initially reported that she had left-sided chest pain, but on further questioning reports that this is more of a pain in her left breast. She feels it is similar to other pain that she is having in her other joints. EKG and troponin are unremarkable. She's not having any further symptoms at this time. 3. Hypertension. Blood pressure is mildly elevated, but she has not taken her medications today. We'll continue current regimen and follow-up with primary care physician. 4. GERD. Continue H2 blockers and PPIs. 5. PAF/SSS status post pacemaker. 6. Chronic kidney disease stage III. Creatinine is currently at baseline.   I have offered the patient overnight observation stay and physical therapy evaluation in a.m . patient is electing to discharge home from the emergency room. She was ambulated in the ER and walked without difficulty using her walker. She is advised to return to the emergency room she has any worsening weakness, sensory deficits, chest pain or trouble breathing.    Time Spent: 69mins  Nichole Keltner M.D. [facility] 05/13/2017, 3:28 PM

## 2017-05-16 DIAGNOSIS — R21 Rash and other nonspecific skin eruption: Secondary | ICD-10-CM | POA: Diagnosis not present

## 2017-05-16 DIAGNOSIS — E860 Dehydration: Secondary | ICD-10-CM | POA: Diagnosis not present

## 2017-05-16 DIAGNOSIS — R531 Weakness: Secondary | ICD-10-CM | POA: Diagnosis not present

## 2017-06-13 DIAGNOSIS — L570 Actinic keratosis: Secondary | ICD-10-CM | POA: Diagnosis not present

## 2017-06-13 DIAGNOSIS — Z85828 Personal history of other malignant neoplasm of skin: Secondary | ICD-10-CM | POA: Diagnosis not present

## 2017-06-15 ENCOUNTER — Encounter: Payer: Medicare Other | Admitting: *Deleted

## 2017-06-15 ENCOUNTER — Telehealth: Payer: Self-pay | Admitting: *Deleted

## 2017-06-15 NOTE — Telephone Encounter (Signed)
Spoke with patient.  Amy [name] is not listed under patient equipment in [place].  Patient reports she never received one in the mail.  Gave verbal instructions about Amy [name] use, ordered to patient's home address through [place].  Advised patient it should arrive in 7-10 business days.  Instructions also mailed to home address from our clinic.  Encouraged patient to call our office for assistance with monitor setup once she receives the adapter.  Patient is appreciative and denies additional questions or concerns at this time.

## 2017-06-15 NOTE — Telephone Encounter (Signed)
Follow up    Juliann Pulse called back to give another number to call her on.

## 2017-06-15 NOTE — Telephone Encounter (Signed)
Patient daughter Juliann Pulse) calling,    1. Has your device fired? no  2. Is you device beeping? no  3. Are you experiencing draining or swelling at device site?no  4. Are you calling to see if we received your device transmission? yes  5. Have you passed out? no

## 2017-06-21 ENCOUNTER — Ambulatory Visit (INDEPENDENT_AMBULATORY_CARE_PROVIDER_SITE_OTHER): Payer: Medicare Other | Admitting: Cardiovascular Disease

## 2017-06-21 ENCOUNTER — Encounter: Payer: Self-pay | Admitting: Cardiovascular Disease

## 2017-06-21 VITALS — BP 142/81 | HR 71 | Ht 64.0 in | Wt 169.0 lb

## 2017-06-21 DIAGNOSIS — I10 Essential (primary) hypertension: Secondary | ICD-10-CM

## 2017-06-21 DIAGNOSIS — I5033 Acute on chronic diastolic (congestive) heart failure: Secondary | ICD-10-CM

## 2017-06-21 DIAGNOSIS — I2510 Atherosclerotic heart disease of native coronary artery without angina pectoris: Secondary | ICD-10-CM

## 2017-06-21 DIAGNOSIS — I4892 Unspecified atrial flutter: Secondary | ICD-10-CM

## 2017-06-21 DIAGNOSIS — E7800 Pure hypercholesterolemia, unspecified: Secondary | ICD-10-CM

## 2017-06-21 DIAGNOSIS — I495 Sick sinus syndrome: Secondary | ICD-10-CM

## 2017-06-21 DIAGNOSIS — I5032 Chronic diastolic (congestive) heart failure: Secondary | ICD-10-CM | POA: Insufficient documentation

## 2017-06-21 DIAGNOSIS — R0602 Shortness of breath: Secondary | ICD-10-CM | POA: Diagnosis not present

## 2017-06-21 DIAGNOSIS — Z950 Presence of cardiac pacemaker: Secondary | ICD-10-CM | POA: Diagnosis not present

## 2017-06-21 MED ORDER — POTASSIUM CHLORIDE ER 10 MEQ PO TBCR: EXTENDED_RELEASE_TABLET | ORAL | 3 refills | Status: DC

## 2017-06-21 MED ORDER — FUROSEMIDE 40 MG PO TABS: ORAL_TABLET | ORAL | 3 refills | Status: DC

## 2017-06-21 NOTE — Progress Notes (Signed)
Cardiology Office Note    Date:  06/21/2017   ID:  Amy [name], DOB [birth year], MRN [id number]  PCP:  Celene Squibb, MD  Cardiologist:   Sanda Klein, MD   No chief complaint: Pacemaker check, coronary artery disease   History of Present Illness:  Amy [name] is a 82 y.o. female with history of coronary artery disease and previous bypass surgery as well as sinus node dysfunction and paroxysmal atrial arrhythmia here for Clinical Follow-up and pacemaker check. She is accompanied by her daughter.   Over the last several weeks she has had worsening dyspnea. She does not have frank orthopnea, but feels breathless and exhausted with minimal activity. She has to stop several times to climb one flight of stairs. She has ankle edema and reports that her weight is roughly 9 pounds higher than her previous usual weight. She has recorded her blood pressure at home on a daily basis and it oscillates in a range of 134/66-162/81. She was seen in the emergency room at [facility] on July 14 with complaints of generalized weakness. Her workup which included CT of the head and labs was essentially negative. Head CT did show atherosclerotic changes and microvascular ischemic chronic abnormalities but no acute intracranial process. Her creatinine was 1.36 essentially identical to the value from July 2016. Electrolytes were normal. She was not anemic. Her echocardiogram shows atrial paced, ventricular sensed rhythm, no ischemic changes  She has not had any falls or hospitalizations, but is getting progressively older and more frail. She remains very mentally alert. She's had a lot of issues with constipation. She denies angina, orthopnea, PND, syncope, claudication, focal/unilateral neurological deficits  Pacemaker interrogation shows normal device function. Her Medtronic EnRhythm device has a battery voltage of 2.92 V (implanted 2010, ERI 2.81 V), probably another 4-5 years of generator longevity  expected. Lead parameters are good. She is not pacemaker dependent. She has 80% atrial pacing and only 0.4% ventricular pacing. The burden of atrial arrhythmia has been extremely low. She had a two-minute episode of atrial fibrillation last December, none since. She had 12 hours of atrial flutter on February 25, 2016 and 3 hours of atrial flutter on April 11, 2016. She has occasional episodes of nonsustained ventricular tachycardia lasting up to 10 seconds in duration, relatively slow at about 160 bpm, which have been seen at a similar frequency over the last few years.  In 2009 she had an acute inferior wall ST segment elevation myocardial infarction treated with urgent angioplasty and placement of a bare-metal stent. Subsequently, she underwent multivessel bypass surgery with LIMA to LAD, SVG to ramus SVG to RCA. She has no evidence of residual myocardial injury and has preserved left ventricular systolic function. In June 2015, cardiac catheterization showed 90% proximal and mid LAD with patent distal LIMA bypass; 40% ostial circumflex, not bypassed; unchanged 80% proximal ramus intermedius, SVG to ramus occluded; patent stent right coronary artery, SVG to right coronary artery occluded. Her LVEF was 55%, there was no significant mitral regurgitation and the LVEDP was high normal at 15 mm Hg, (when she was short of breath). [name] stated that her dyspnea is likely multifactorial and cannot be explained completely by cardiac findings. In May 2016 she had a low risk nuclear study.  In 2010 a permanent pacemaker (Medtronic Enrhythm) was implanted for symptomatic bradycardia. She has paroxysmal atrial fibrillation and atrial flutter, but she is not taking anticoagulants to fall/bleeding risk. She does take aspirin.  In late 2013 she had a fall complicated by a hip fracture and radius fracture. She had an esophageal perforation complicated by mediastinitis and had a feeding tube for several months. In  October 2015 she had another fall in her home (no syncope, tripped at [facility].carpet transition) and fractured her pelvis and humerus.   She has fairly severe hyperlipidemia with an LDL cholesterol in excess of 200. She is statin, niacin, ezetimibe and resin intolerant. She has discussed PSCK9 inhibitors in the past but has decided not to take these.  She has systolic HTN, but also a tendency to marked orthostatic hypotension (25 mm Hg drop in SBP).  She has moderate CK D, baseline GFR around 30 mL/minute, has seen Dr. Lowanda Foster in the past, but does not have regular nephrology follow-up  Past Medical History:  Diagnosis Date  . Anxiety   . Arthritis   . Asthma   . CAD in native artery    s/p CABG x 3; Last Myoview in 07/2009 - non-ischemic; Echo 03/2012  Aortic Sclerosis with normal EF.  . CKD (chronic kidney disease), stage III   . DVT (deep vein thrombosis) in pregnancy ([place])   . Esophageal perforation    9/13  . Fall 07/21/2014   FALL WITH INJURY  . Fracture of head of humerus   . Fracture of hip ([place]) 07/21/2014   LEFT  . GERD (gastroesophageal reflux disease)   . Hypercholesteremia   . Hypertension   . Hypothyroidism   . Mediastinitis    s/p drainage  . Myocardial infarction [facility])    Inferior STEMI 07/2008 - PCI of prox RCA followed byu CABG x 3 in 9/'09  . Pacemaker   . PAF (paroxysmal atrial fibrillation) ([place])   . Paroxysmal atrial flutter ([place]) 11/26/2014  . Pneumonia   . Shortness of breath   . SSS (sick sinus syndrome) ([place]) 11/26/2014  . Status post hip hemiarthroplasty left hip by Dr Ronnie Derby 08/08/2012    Past Surgical History:  Procedure Laterality Date  . CARPAL TUNNEL RELEASE  08/08/2012   Procedure: CARPAL TUNNEL RELEASE;  Surgeon: Schuyler Amor, MD;  Location: [place];  Service: Orthopedics;  Laterality: Left;  . CORONARY ARTERY BYPASS GRAFT  2009   LIMA-LAD, SVG-RI, SVG-stented RCA  . ESOPHAGOGASTRODUODENOSCOPY  08/10/2012   Procedure:  ESOPHAGOGASTRODUODENOSCOPY (EGD);  Surgeon: Beryle Beams, MD;  Location: [facility] ENDOSCOPY;  Service: Endoscopy;  Laterality: N/A;  . ESOPHAGOSCOPY  08/10/2012   Procedure: ESOPHAGOSCOPY;  Surgeon: [name], MD;  Location: [place];  Service: ENT;  Laterality: N/A;  . GASTROSTOMY  08/16/2012   Procedure: GASTROSTOMY;  Surgeon: Zenovia Jarred, MD;  Location: [place];  Service: General;  Laterality: N/A;  . HIP ARTHROPLASTY  08/08/2012   Procedure: ARTHROPLASTY BIPOLAR HIP;  Surgeon: Rudean Haskell, MD;  Location: [place];  Service: Orthopedics;  Laterality: Left;  [place]   . JOINT REPLACEMENT    . LEFT HEART CATHETERIZATION WITH CORONARY ANGIOGRAM N/A 04/16/2014   Procedure: LEFT HEART CATHETERIZATION WITH CORONARY ANGIOGRAM;  Surgeon: Wellington Hampshire, MD;  Location: [place] CATH LAB;  Service: Cardiovascular;  Laterality: N/A;  . NM MYOCAR PERF WALL MOTION  08/14/2009   Normal  . PACEMAKER INSERTION  01/06/2009   Medtronic  . TOTAL HIP ARTHROPLASTY    . WRIST FRACTURE SURGERY  07/2012   left intra articular  with carpel tunnel    Current Medications: Outpatient Medications Prior to Visit  Medication Sig Dispense Refill  . acetaminophen (TYLENOL)  500 MG tablet Take 1,000 mg by mouth every 6 (six) hours as needed for moderate pain.    Marland Kitchen albuterol (PROVENTIL HFA;VENTOLIN HFA) 108 (90 BASE) MCG/ACT inhaler Inhale 2 puffs into the lungs every 4 (four) hours as needed for wheezing or shortness of breath. 1 Inhaler 1  . aspirin EC 81 MG tablet Take 81 mg by mouth daily.    . Cholecalciferol (VITAMIN D3 PO) Take 1 tablet by mouth daily.    . cycloSPORINE (RESTASIS) 0.05 % ophthalmic emulsion Place 1 drop into both eyes 2 (two) times daily.    Marland Kitchen docusate sodium (COLACE) 100 MG capsule Take 100 mg by mouth 2 (two) times daily.    . fluticasone-salmeterol (ADVAIR HFA) 115-21 MCG/ACT inhaler Inhale 2 puffs into the lungs 2 (two) times daily. 1 Inhaler 12  . levothyroxine (SYNTHROID, LEVOTHROID) 50 MCG tablet  Take 1 tablet by mouth daily.    Marland Kitchen LORazepam (ATIVAN) 1 MG tablet Take 1 mg by mouth daily as needed for sleep.    Marland Kitchen losartan (COZAAR) 100 MG tablet TAKE ONE TABLET BY MOUTH ONCE DAILY 90 tablet 3  . metoprolol succinate (TOPROL-XL) 25 MG 24 hr tablet TAKE ONE-HALF TABLET BY MOUTH ONCE DAILY 15 tablet 5  . nitroGLYCERIN (NITROSTAT) 0.4 MG SL tablet Place 1 tablet (0.4 mg total) under the tongue every 5 (five) minutes x 3 doses as needed for chest pain. 25 tablet 12  . omeprazole (PRILOSEC) 40 MG capsule Take 1 capsule (40 mg total) by mouth daily. 90 capsule 3  . ondansetron (ZOFRAN) 4 MG tablet Take 4 mg by mouth every 6 (six) hours as needed for nausea or vomiting.    . ranitidine (ZANTAC) 150 MG tablet Take 1 tablet (150 mg total) by mouth at bedtime. 90 tablet 3   No facility-administered medications prior to visit.      Allergies:   Colestipol; Hydrocodone-acetaminophen; Niacin and related; Statins; and Zetia [ezetimibe]   Social History   Social History  . Marital status: Widowed    Spouse name: N/A  . Number of children: N/A  . Years of education: N/A   Social History Main Topics  . Smoking status: Never Smoker  . Smokeless tobacco: Never Used  . Alcohol use No  . Drug use: No  . Sexual activity: No   Other Topics Concern  . None   Social History Narrative  . None     Family History:  The patient's family history includes Stroke in her father.   ROS:   Please see the history of present illness.    ROS All other systems reviewed and are negative.   PHYSICAL EXAM:   VS:  BP (!) 142/81 (BP Location: Right Arm, Patient Position: Sitting, Cuff Size: Normal)   Pulse 71   Ht 5\' 4"  (1.626 m)   Wt 169 lb (76.7 kg)   BMI 29.01 kg/m    GEN: Well nourished, well developed, in no acute distress , Uses a walker  General: Alert, oriented x3, no distress Head: no evidence of trauma, PERRL, EOMI, no exophtalmos or lid lag, no myxedema, no xanthelasma; normal ears, nose and  oropharynx Neck: Moderately elevated jugular venous pulsations at roughly 6-7 centimeters; brisk carotid pulses without delay and no carotid bruits Chest: clear to auscultation, no signs of consolidation by percussion or palpation, normal fremitus, symmetrical and full respiratory excursions, healthy subclavian pacemaker site Cardiovascular: normal position and quality of the apical impulse, regular rhythm, normal first and second heart sounds,  no murmurs, rubs or gallops Abdomen: no tenderness or distention, no masses by palpation, no abnormal pulsatility or arterial bruits, normal bowel sounds, no hepatosplenomegaly Extremities: no clubbing, cyanosis, 1-2 plus symmetrical ankle edema; 2+ radial, ulnar and brachial pulses bilaterally; 2+ right femoral, posterior tibial and dorsalis pedis pulses; 2+ left femoral, posterior tibial and dorsalis pedis pulses; no subclavian or femoral bruits Neurological: grossly nonfocal Psych: euthymic mood, full affect  Wt Readings from Last 3 Encounters:  06/21/17 169 lb (76.7 kg)  05/13/17 160 lb (72.6 kg)  10/26/16 164 lb 1.6 oz (74.4 kg)      Studies/Labs Reviewed:   EKG:  EKG is ordered today.  The ekg ordered today demonstrates Atrial paced, ventricular sensed rhythm with long AV delay (268 ms and (, otherwise normal tracing. QTC 430 ms Lipid Panel    Component Value Date/Time   CHOL 293 (H) 04/16/2014 0422   TRIG 178 (H) 04/16/2014 0422   HDL 47 04/16/2014 0422   CHOLHDL 6.2 04/16/2014 0422   VLDL 36 04/16/2014 0422   LDLCALC 210 (H) 04/16/2014 0422    ASSESSMENT:    1. Acute on chronic diastolic heart failure ([place])   2. Coronary artery disease involving native coronary artery of native heart without angina pectoris   3. SSS (sick sinus syndrome) ([facility])   4. Paroxysmal atrial flutter ([place])   5. Pure hypercholesterolemia   6. Essential hypertension   7. Pacemaker   8. Shortness of breath      PLAN:  In order of problems listed  above:  1. CHF: History and physical findings suggest acute on chronic diastolic heart failure. She is roughly 9 pounds above her estimated "dry weight". Her creatinine was recently checked and found to be at baseline. In the past she took diuretics, but these were stopped due to concern about her renal function. Will resume furosemide and a potassium supplement and recheck labs next week. We'll try to get her weight back down to around 160 pounds. Asked her to keep a detailed daily log of her weights. She should bring this to her doctor appointments. Also reviewed the importance of dietary sodium restriction. 2. CAD s/p CABG: in spite of extensive coronary problems in the past she is currently Free of angina. She is not taking any antiarrhythmic medication. She is taking aspirin. Unfortunately she is statin intolerant. 3. SSS: Almost 80% atrial paced, satisfactory heart rate histogram considering her lifestyle. 4. AFlutter: Poor anticoagulation candidate due to serious and frequent falls, albeit without any recent falls or injuries. CHADSVAsc 6 (age 22, gender, CHF, hypertension, CAD). The burden of arrhythmia is extremely low, with only 2 minutes of atrial fibrillation in the last 8 months. No change in management. It is doubtful that she would benefit from a Watchman device at her age. 5. HLP: Unfortunately without any good options for lipid lowering. Because of this, we have not been checking lipids on a routine basis. 6. HTN: Blood pressure control is satisfactory. "Perfect" blood pressure control should be avoided, since this has led to orthostatic hypotension and falls in the past 7. PPM: Normal device function. Continue remote downloads every 3 months. Although this particular model has shown problems with possible early battery depletion, so far no signs of a problem and she is not pacemaker dependent.    Medication Adjustments/Labs and Tests Ordered: Current medicines are reviewed at length  with the patient today.  Concerns regarding medicines are outlined above.  Medication changes, Labs and Tests ordered today are  listed in the Patient Instructions below. Patient Instructions  Medication Instructions: Dr Sallyanne Kuster has recommended making the following medication changes: 1. START Furosemide 40 mg - take 1 tablet daily until you lose 8 pounds THEN every OTHER day after that 2. START Potassium 10 mEq - take 1 tablet daily until you lose 8 pounds THEN every OTHER day after that  Your physician recommends that you weigh, daily, at the same time every day, and in the same amount of clothing. Please record your daily weights on the handout provided. Please call the office early next week to report your weights to a nurse.  Labwork: Your physician recommends that you return for lab work at your convenience.  Testing/Procedures: Remote monitoring is used to monitor your Pacemaker or ICD from home. This monitoring reduces the number of office visits required to check your device to one time per year. It allows [place] to keep an eye on the functioning of your device to ensure it is working properly. You are scheduled for a device check from home on Wednesday, November 21st, 2018. You may send your transmission at any time that day. If you have a wireless device, the transmission will be sent automatically. After your physician reviews your transmission, you will receive a notification with your next transmission date.  Follow-up: Dr Sallyanne Kuster recommends that you schedule a follow-up appointment in 12 months with a pacemaker check. You will receive a reminder letter in the mail two months in advance. If you don't receive a letter, please call our office to schedule the follow-up appointment.  If you need a refill on your cardiac medications before your next appointment, please call your pharmacy.    Signed, Sanda Klein, MD  06/21/2017 4:22 PM    [address], [place], [place]  [address] Phone: [phone]; Fax: [phone]

## 2017-06-21 NOTE — Patient Instructions (Signed)
Medication Instructions: Dr Sallyanne Kuster has recommended making the following medication changes: 1. START Furosemide 40 mg - take 1 tablet daily until you lose 8 pounds THEN every OTHER day after that 2. START Potassium 10 mEq - take 1 tablet daily until you lose 8 pounds THEN every OTHER day after that  Your physician recommends that you weigh, daily, at the same time every day, and in the same amount of clothing. Please record your daily weights on the handout provided. Please call the office early next week to report your weights to a nurse.  Labwork: Your physician recommends that you return for lab work at your convenience.  Testing/Procedures: Remote monitoring is used to monitor your Pacemaker or ICD from home. This monitoring reduces the number of office visits required to check your device to one time per year. It allows [place] to keep an eye on the functioning of your device to ensure it is working properly. You are scheduled for a device check from home on Wednesday, November 21st, 2018. You may send your transmission at any time that day. If you have a wireless device, the transmission will be sent automatically. After your physician reviews your transmission, you will receive a notification with your next transmission date.  Follow-up: Dr Sallyanne Kuster recommends that you schedule a follow-up appointment in 12 months with a pacemaker check. You will receive a reminder letter in the mail two months in advance. If you don't receive a letter, please call our office to schedule the follow-up appointment.  If you need a refill on your cardiac medications before your next appointment, please call your pharmacy.

## 2017-06-26 ENCOUNTER — Telehealth: Payer: Self-pay | Admitting: Cardiovascular Disease

## 2017-06-26 NOTE — Telephone Encounter (Signed)
Please continue the diuretics. We are trying to get the "fluid weight" down to 160 lb - we are about half way there. I expect the breathing will improve when we do.  If she stops losing weight and does not get down to 160 lb by middle of this week, please call back. MCr

## 2017-06-26 NOTE — Telephone Encounter (Signed)
New message    Pt was in on Wednesday and daughter was told to call and give an update on her weight and BP today. Weight-164.5 today on 24th it 171 on their scale at home. BP-8/26-134/64, 8/27-144/68  Pt daughter states there has been no improvement. She said they didn't get the medication on Friday.

## 2017-06-26 NOTE — Telephone Encounter (Signed)
Spoke with Juliann Pulse, aware of dr croitoru's recommendations.

## 2017-06-26 NOTE — Telephone Encounter (Signed)
Spoke with pt dtr, the patient started the furosemide until Friday. Her swelling is better but she continues to have weakness and SOB. She has an appointment with her PCP this week. Dr croitoru had told the patient to call, will forward for his review and advise.

## 2017-06-27 ENCOUNTER — Encounter (INDEPENDENT_AMBULATORY_CARE_PROVIDER_SITE_OTHER): Payer: Self-pay | Admitting: Internal Medicine

## 2017-06-27 DIAGNOSIS — I10 Essential (primary) hypertension: Secondary | ICD-10-CM | POA: Diagnosis not present

## 2017-06-27 DIAGNOSIS — E039 Hypothyroidism, unspecified: Secondary | ICD-10-CM | POA: Diagnosis not present

## 2017-06-29 DIAGNOSIS — R944 Abnormal results of kidney function studies: Secondary | ICD-10-CM | POA: Diagnosis not present

## 2017-06-29 DIAGNOSIS — E782 Mixed hyperlipidemia: Secondary | ICD-10-CM | POA: Diagnosis not present

## 2017-06-29 DIAGNOSIS — I5020 Unspecified systolic (congestive) heart failure: Secondary | ICD-10-CM | POA: Diagnosis not present

## 2017-06-29 DIAGNOSIS — I10 Essential (primary) hypertension: Secondary | ICD-10-CM | POA: Diagnosis not present

## 2017-06-29 DIAGNOSIS — E039 Hypothyroidism, unspecified: Secondary | ICD-10-CM | POA: Diagnosis not present

## 2017-06-29 DIAGNOSIS — I2510 Atherosclerotic heart disease of native coronary artery without angina pectoris: Secondary | ICD-10-CM | POA: Diagnosis not present

## 2017-06-29 DIAGNOSIS — J449 Chronic obstructive pulmonary disease, unspecified: Secondary | ICD-10-CM | POA: Diagnosis not present

## 2017-07-04 DIAGNOSIS — F5101 Primary insomnia: Secondary | ICD-10-CM | POA: Diagnosis not present

## 2017-07-04 DIAGNOSIS — E039 Hypothyroidism, unspecified: Secondary | ICD-10-CM | POA: Diagnosis not present

## 2017-07-04 DIAGNOSIS — N189 Chronic kidney disease, unspecified: Secondary | ICD-10-CM | POA: Diagnosis not present

## 2017-07-04 DIAGNOSIS — K5900 Constipation, unspecified: Secondary | ICD-10-CM | POA: Diagnosis not present

## 2017-07-04 DIAGNOSIS — I10 Essential (primary) hypertension: Secondary | ICD-10-CM | POA: Diagnosis not present

## 2017-07-04 DIAGNOSIS — I5020 Unspecified systolic (congestive) heart failure: Secondary | ICD-10-CM | POA: Diagnosis not present

## 2017-07-04 DIAGNOSIS — I2510 Atherosclerotic heart disease of native coronary artery without angina pectoris: Secondary | ICD-10-CM | POA: Diagnosis not present

## 2017-07-04 DIAGNOSIS — Z6827 Body mass index (BMI) 27.0-27.9, adult: Secondary | ICD-10-CM | POA: Diagnosis not present

## 2017-07-04 DIAGNOSIS — R944 Abnormal results of kidney function studies: Secondary | ICD-10-CM | POA: Diagnosis not present

## 2017-07-04 DIAGNOSIS — R42 Dizziness and giddiness: Secondary | ICD-10-CM | POA: Diagnosis not present

## 2017-07-04 DIAGNOSIS — F411 Generalized anxiety disorder: Secondary | ICD-10-CM | POA: Diagnosis not present

## 2017-07-04 DIAGNOSIS — J449 Chronic obstructive pulmonary disease, unspecified: Secondary | ICD-10-CM | POA: Diagnosis not present

## 2017-07-05 ENCOUNTER — Telehealth: Payer: Self-pay | Admitting: Cardiovascular Disease

## 2017-07-05 NOTE — Telephone Encounter (Signed)
New message      Pt c/o BP issue: STAT if pt c/o blurred vision, one-sided weakness or slurred speech  1. What are your last 5 BP readings?  Aug 31 144/66 HR 74, sept 1 142/72 HR 73, sept 2 136/62 HR 74, sept 3 117/65 HR 72, sept 4 151/58 HR 77, sept 5 117/59 HR 72 2. Are you having any other symptoms (ex. Dizziness, headache, blurred vision, passed out)?  No 3. What is your BP issue?  Calling to give bp readings and wt readings.  Weight: aug 29 165, aug 30 165.5, aug 31 164.5, sept 1 166.5, sept 2 166, sept 3 168, sept 4 166, today 164.5.  Pt had kidney function labs drawn at Dr Josue Hector office last week and yesterday.  Patient requested results be sent to Dr Sallyanne Kuster

## 2017-07-05 NOTE — Telephone Encounter (Signed)
Returned call to daughter (ok per DPR)-calling to report BP and weight readings to Dr. Sallyanne Kuster.  Also wanted to let him know PCP changed her Advair to Trelegy and placed her on Linzess as well for constipation.      Advised I would route to Dr. Sallyanne Kuster for review/recommendations.    OV 8/22:  CHF: History and physical findings suggest acute on chronic diastolic heart failure. She is roughly 9 pounds above her estimated "dry weight". Her creatinine was recently checked and found to be at baseline. In the past she took diuretics, but these were stopped due to concern about her renal function. Will resume furosemide and a potassium supplement and recheck labs next week. We'll try to get her weight back down to around 160 pounds. Asked her to keep a detailed daily log of her weights. She should bring this to her doctor appointments. Also reviewed the importance of dietary sodium restriction

## 2017-07-05 NOTE — Telephone Encounter (Signed)
BP is good. Ideally want weight down to 160 lb so would like to increase the diuretic. Need to look at labs first. Please let me know when we get them. MCr

## 2017-07-11 NOTE — Telephone Encounter (Signed)
Called Dr Juel Burrow office to request labs.

## 2017-07-25 ENCOUNTER — Other Ambulatory Visit: Payer: Self-pay | Admitting: Cardiovascular Disease

## 2017-07-25 ENCOUNTER — Other Ambulatory Visit (INDEPENDENT_AMBULATORY_CARE_PROVIDER_SITE_OTHER): Payer: Self-pay | Admitting: Internal Medicine

## 2017-07-25 ENCOUNTER — Encounter (INDEPENDENT_AMBULATORY_CARE_PROVIDER_SITE_OTHER): Payer: Self-pay

## 2017-07-25 ENCOUNTER — Encounter (INDEPENDENT_AMBULATORY_CARE_PROVIDER_SITE_OTHER): Payer: Self-pay | Admitting: Internal Medicine

## 2017-07-25 ENCOUNTER — Ambulatory Visit (INDEPENDENT_AMBULATORY_CARE_PROVIDER_SITE_OTHER): Payer: Medicare Other | Admitting: Internal Medicine

## 2017-07-25 VITALS — BP 140/70 | HR 80 | Temp 98.0°F | Ht 65.0 in | Wt 167.0 lb

## 2017-07-25 DIAGNOSIS — I2510 Atherosclerotic heart disease of native coronary artery without angina pectoris: Secondary | ICD-10-CM

## 2017-07-25 DIAGNOSIS — K219 Gastro-esophageal reflux disease without esophagitis: Secondary | ICD-10-CM

## 2017-07-25 NOTE — Progress Notes (Signed)
Subjective:    Patient ID: Amy [name], female    DOB: [birth year], 83 y.o.   MRN: [id number]  HPI Here today for f/u. Last seen in December of 2017. Hx of chronic GERD. Has HOB elevated on blocks.  Very little acid reflux during the day. Acid reflux worse at night.   She tells me she is doing well.  The first of July she says she lost all her energy. She was SOB. She was started on Lasix by Dr. Montel Culver for edema. Her appetite is good. No weight loss.  Usually has a BM daily but may skip a day.   No melena or BRRB.  She says she has had some constipation.     Review of Systems Past Medical History:  Diagnosis Date  . Anxiety   . Arthritis   . Asthma   . CAD in native artery    s/p CABG x 3; Last Myoview in 07/2009 - non-ischemic; Echo 03/2012  Aortic Sclerosis with normal EF.  . CKD (chronic kidney disease), stage III   . DVT (deep vein thrombosis) in pregnancy ([place])   . Esophageal perforation    9/13  . Fall 07/21/2014   FALL WITH INJURY  . Fracture of head of humerus   . Fracture of hip ([place]) 07/21/2014   LEFT  . GERD (gastroesophageal reflux disease)   . Hypercholesteremia   . Hypertension   . Hypothyroidism   . Mediastinitis    s/p drainage  . Myocardial infarction [facility])    Inferior STEMI 07/2008 - PCI of prox RCA followed byu CABG x 3 in 9/'09  . Pacemaker   . PAF (paroxysmal atrial fibrillation) ([place])   . Paroxysmal atrial flutter ([place]) 11/26/2014  . Pneumonia   . Shortness of breath   . SSS (sick sinus syndrome) ([place]) 11/26/2014  . Status post hip hemiarthroplasty left hip by Dr Ronnie Derby 08/08/2012    Past Surgical History:  Procedure Laterality Date  . CARPAL TUNNEL RELEASE  08/08/2012   Procedure: CARPAL TUNNEL RELEASE;  Surgeon: Schuyler Amor, MD;  Location: [place];  Service: Orthopedics;  Laterality: Left;  . CORONARY ARTERY BYPASS GRAFT  2009   LIMA-LAD, SVG-RI, SVG-stented RCA  . ESOPHAGOGASTRODUODENOSCOPY  08/10/2012   Procedure:  ESOPHAGOGASTRODUODENOSCOPY (EGD);  Surgeon: Beryle Beams, MD;  Location: [facility] ENDOSCOPY;  Service: Endoscopy;  Laterality: N/A;  . ESOPHAGOSCOPY  08/10/2012   Procedure: ESOPHAGOSCOPY;  Surgeon: [name], MD;  Location: [place];  Service: ENT;  Laterality: N/A;  . GASTROSTOMY  08/16/2012   Procedure: GASTROSTOMY;  Surgeon: Zenovia Jarred, MD;  Location: [place];  Service: General;  Laterality: N/A;  . HIP ARTHROPLASTY  08/08/2012   Procedure: ARTHROPLASTY BIPOLAR HIP;  Surgeon: Rudean Haskell, MD;  Location: [place];  Service: Orthopedics;  Laterality: Left;  [place]   . JOINT REPLACEMENT    . LEFT HEART CATHETERIZATION WITH CORONARY ANGIOGRAM N/A 04/16/2014   Procedure: LEFT HEART CATHETERIZATION WITH CORONARY ANGIOGRAM;  Surgeon: Wellington Hampshire, MD;  Location: [place] CATH LAB;  Service: Cardiovascular;  Laterality: N/A;  . NM MYOCAR PERF WALL MOTION  08/14/2009   Normal  . PACEMAKER INSERTION  01/06/2009   Medtronic  . TOTAL HIP ARTHROPLASTY    . WRIST FRACTURE SURGERY  07/2012   left intra articular  with carpel tunnel    Allergies  Allergen Reactions  . Colestipol     Muscle aches  . Hydrocodone-Acetaminophen Itching    Tolerates tylenol  .  Niacin And Related   . Statins     Muscle aches on all she has tried.  She recalls Lipitor, Crestor, and Livalo but thinks there were others  . Zetia [Ezetimibe]     Muscle aches    Current Outpatient Prescriptions on File Prior to Visit  Medication Sig Dispense Refill  . acetaminophen (TYLENOL) 500 MG tablet Take 1,000 mg by mouth every 6 (six) hours as needed for moderate pain.    Marland Kitchen aspirin EC 81 MG tablet Take 81 mg by mouth daily.    . Cholecalciferol (VITAMIN D3 PO) Take 1 tablet by mouth daily.    . cycloSPORINE (RESTASIS) 0.05 % ophthalmic emulsion Place 1 drop into both eyes 2 (two) times daily.    Marland Kitchen docusate sodium (COLACE) 100 MG capsule Take 100 mg by mouth 2 (two) times daily.    . furosemide (LASIX) 40 MG tablet Take 1 tablet  (40 mg total) by mouth daily as directed. 60 tablet 3  . levothyroxine (SYNTHROID, LEVOTHROID) 50 MCG tablet Take 1 tablet by mouth daily.    Marland Kitchen LORazepam (ATIVAN) 1 MG tablet Take 1 mg by mouth daily as needed for sleep.    . nitroGLYCERIN (NITROSTAT) 0.4 MG SL tablet Place 1 tablet (0.4 mg total) under the tongue every 5 (five) minutes x 3 doses as needed for chest pain. 25 tablet 12  . potassium chloride (K-DUR) 10 MEQ tablet Take 1 tablet (10 mEq total) by mouth daily as directed. 60 tablet 3  . ranitidine (ZANTAC) 150 MG tablet Take 1 tablet (150 mg total) by mouth at bedtime. 90 tablet 3   No current facility-administered medications on file prior to visit.         Objective:   Physical Exam Blood pressure 140/70, pulse 80, temperature 98 F (36.7 C), height 5\' 5"  (1.651 m), weight 167 lb (75.8 kg). Alert and oriented. Skin warm and dry. Oral mucosa is moist.   . Sclera anicteric, conjunctivae is pink. Thyroid not enlarged. No cervical lymphadenopathy. Lungs clear. Heart regular rate and rhythm.  Abdomen is soft. Bowel sounds are positive. No hepatomegaly. No abdominal masses felt. No tenderness.  No edema to lower extremities.          Assessment & Plan:  GERD. Continue the Zantac.  OV in 1 year.

## 2017-07-25 NOTE — Patient Instructions (Signed)
Continue the Zantac. OV in 1 year.

## 2017-08-01 DIAGNOSIS — R42 Dizziness and giddiness: Secondary | ICD-10-CM | POA: Diagnosis not present

## 2017-08-01 DIAGNOSIS — I509 Heart failure, unspecified: Secondary | ICD-10-CM | POA: Diagnosis not present

## 2017-08-01 DIAGNOSIS — G4700 Insomnia, unspecified: Secondary | ICD-10-CM | POA: Diagnosis not present

## 2017-08-01 DIAGNOSIS — J449 Chronic obstructive pulmonary disease, unspecified: Secondary | ICD-10-CM | POA: Diagnosis not present

## 2017-08-01 DIAGNOSIS — Z6827 Body mass index (BMI) 27.0-27.9, adult: Secondary | ICD-10-CM | POA: Diagnosis not present

## 2017-08-01 DIAGNOSIS — I5020 Unspecified systolic (congestive) heart failure: Secondary | ICD-10-CM | POA: Diagnosis not present

## 2017-08-01 DIAGNOSIS — K5900 Constipation, unspecified: Secondary | ICD-10-CM | POA: Diagnosis not present

## 2017-08-01 DIAGNOSIS — F419 Anxiety disorder, unspecified: Secondary | ICD-10-CM | POA: Diagnosis not present

## 2017-08-01 DIAGNOSIS — I10 Essential (primary) hypertension: Secondary | ICD-10-CM | POA: Diagnosis not present

## 2017-08-01 DIAGNOSIS — I2510 Atherosclerotic heart disease of native coronary artery without angina pectoris: Secondary | ICD-10-CM | POA: Diagnosis not present

## 2017-08-01 DIAGNOSIS — R944 Abnormal results of kidney function studies: Secondary | ICD-10-CM | POA: Diagnosis not present

## 2017-08-14 DIAGNOSIS — E039 Hypothyroidism, unspecified: Secondary | ICD-10-CM | POA: Diagnosis not present

## 2017-08-14 DIAGNOSIS — I10 Essential (primary) hypertension: Secondary | ICD-10-CM | POA: Diagnosis not present

## 2017-08-16 DIAGNOSIS — I5020 Unspecified systolic (congestive) heart failure: Secondary | ICD-10-CM | POA: Diagnosis not present

## 2017-08-16 DIAGNOSIS — J449 Chronic obstructive pulmonary disease, unspecified: Secondary | ICD-10-CM | POA: Diagnosis not present

## 2017-08-16 DIAGNOSIS — I2510 Atherosclerotic heart disease of native coronary artery without angina pectoris: Secondary | ICD-10-CM | POA: Diagnosis not present

## 2017-08-16 DIAGNOSIS — Z23 Encounter for immunization: Secondary | ICD-10-CM | POA: Diagnosis not present

## 2017-08-16 DIAGNOSIS — R944 Abnormal results of kidney function studies: Secondary | ICD-10-CM | POA: Diagnosis not present

## 2017-08-19 ENCOUNTER — Other Ambulatory Visit (INDEPENDENT_AMBULATORY_CARE_PROVIDER_SITE_OTHER): Payer: Self-pay | Admitting: Internal Medicine

## 2017-08-19 DIAGNOSIS — K219 Gastro-esophageal reflux disease without esophagitis: Secondary | ICD-10-CM

## 2017-09-06 DIAGNOSIS — I10 Essential (primary) hypertension: Secondary | ICD-10-CM | POA: Diagnosis not present

## 2017-09-06 DIAGNOSIS — F5105 Insomnia due to other mental disorder: Secondary | ICD-10-CM | POA: Diagnosis not present

## 2017-09-06 DIAGNOSIS — K5900 Constipation, unspecified: Secondary | ICD-10-CM | POA: Diagnosis not present

## 2017-09-06 DIAGNOSIS — R42 Dizziness and giddiness: Secondary | ICD-10-CM | POA: Diagnosis not present

## 2017-09-06 DIAGNOSIS — J449 Chronic obstructive pulmonary disease, unspecified: Secondary | ICD-10-CM | POA: Diagnosis not present

## 2017-09-06 DIAGNOSIS — I5042 Chronic combined systolic (congestive) and diastolic (congestive) heart failure: Secondary | ICD-10-CM | POA: Diagnosis not present

## 2017-09-06 DIAGNOSIS — R944 Abnormal results of kidney function studies: Secondary | ICD-10-CM | POA: Diagnosis not present

## 2017-09-20 ENCOUNTER — Ambulatory Visit (INDEPENDENT_AMBULATORY_CARE_PROVIDER_SITE_OTHER): Payer: Medicare Other | Admitting: *Deleted

## 2017-09-20 DIAGNOSIS — I495 Sick sinus syndrome: Secondary | ICD-10-CM

## 2017-09-20 NOTE — Progress Notes (Signed)
Remote pacemaker transmission.   

## 2017-09-29 ENCOUNTER — Encounter: Payer: Self-pay | Admitting: Cardiology

## 2017-09-30 ENCOUNTER — Other Ambulatory Visit (HOSPITAL_COMMUNITY): Payer: Self-pay | Admitting: Physician Assistant

## 2017-09-30 ENCOUNTER — Ambulatory Visit (HOSPITAL_COMMUNITY)
Admission: RE | Admit: 2017-09-30 | Discharge: 2017-09-30 | Disposition: A | Discharge status: Home / Self Care | Payer: Medicare Other | Source: Ambulatory Visit | Attending: Physician Assistant | Admitting: Physician Assistant

## 2017-09-30 DIAGNOSIS — R05 Cough: Secondary | ICD-10-CM | POA: Diagnosis not present

## 2017-09-30 DIAGNOSIS — I700 Atherosclerosis of aorta: Secondary | ICD-10-CM | POA: Diagnosis not present

## 2017-09-30 DIAGNOSIS — J449 Chronic obstructive pulmonary disease, unspecified: Secondary | ICD-10-CM | POA: Diagnosis not present

## 2017-09-30 DIAGNOSIS — R0989 Other specified symptoms and signs involving the circulatory and respiratory systems: Secondary | ICD-10-CM

## 2017-09-30 DIAGNOSIS — J069 Acute upper respiratory infection, unspecified: Secondary | ICD-10-CM | POA: Diagnosis not present

## 2017-09-30 DIAGNOSIS — I517 Cardiomegaly: Secondary | ICD-10-CM | POA: Insufficient documentation

## 2017-10-02 DIAGNOSIS — Z712 Person consulting for explanation of examination or test findings: Secondary | ICD-10-CM | POA: Diagnosis not present

## 2017-10-02 DIAGNOSIS — J069 Acute upper respiratory infection, unspecified: Secondary | ICD-10-CM | POA: Diagnosis not present

## 2017-10-10 LAB — CUP PACEART REMOTE DEVICE CHECK
Battery Voltage: 2.9 V
Brady Statistic AP VP Percent: 0.68 %
Brady Statistic AP VS Percent: 77.07 %
Brady Statistic AS VP Percent: 0.28 %
Brady Statistic AS VS Percent: 21.97 %
Brady Statistic RA Percent Paced: 77.25 %
Brady Statistic RV Percent Paced: 1.02 %
Date Time Interrogation Session: 20181121140314
Implantable Lead Implant Date: 20100309
Implantable Lead Implant Date: 20100309
Implantable Lead Location: 753859
Implantable Lead Location: 753860
Implantable Lead Model: 5076
Implantable Lead Model: 5076
Implantable Pulse Generator Implant Date: 20100309
Lead Channel Impedance Value: 424 Ohm
Lead Channel Impedance Value: 800 Ohm
Lead Channel Sensing Intrinsic Amplitude: 14.751 mV
Lead Channel Sensing Intrinsic Amplitude: 2.338 mV
Lead Channel Setting Pacing Amplitude: 2 V
Lead Channel Setting Pacing Amplitude: 2.5 V
Lead Channel Setting Pacing Pulse Width: 0.8 ms
Lead Channel Setting Sensing Sensitivity: 0.9 mV

## 2017-10-19 DIAGNOSIS — R42 Dizziness and giddiness: Secondary | ICD-10-CM | POA: Diagnosis not present

## 2017-10-19 DIAGNOSIS — I5042 Chronic combined systolic (congestive) and diastolic (congestive) heart failure: Secondary | ICD-10-CM | POA: Diagnosis not present

## 2017-10-19 DIAGNOSIS — J449 Chronic obstructive pulmonary disease, unspecified: Secondary | ICD-10-CM | POA: Diagnosis not present

## 2017-10-19 DIAGNOSIS — M6281 Muscle weakness (generalized): Secondary | ICD-10-CM | POA: Diagnosis not present

## 2017-10-19 DIAGNOSIS — J069 Acute upper respiratory infection, unspecified: Secondary | ICD-10-CM | POA: Diagnosis not present

## 2017-10-19 DIAGNOSIS — R944 Abnormal results of kidney function studies: Secondary | ICD-10-CM | POA: Diagnosis not present

## 2017-10-19 DIAGNOSIS — I10 Essential (primary) hypertension: Secondary | ICD-10-CM | POA: Diagnosis not present

## 2017-10-19 DIAGNOSIS — R001 Bradycardia, unspecified: Secondary | ICD-10-CM | POA: Diagnosis not present

## 2017-10-19 DIAGNOSIS — F5105 Insomnia due to other mental disorder: Secondary | ICD-10-CM | POA: Diagnosis not present

## 2017-10-25 DIAGNOSIS — N390 Urinary tract infection, site not specified: Secondary | ICD-10-CM | POA: Diagnosis not present

## 2017-10-26 DIAGNOSIS — R944 Abnormal results of kidney function studies: Secondary | ICD-10-CM | POA: Diagnosis not present

## 2017-10-26 DIAGNOSIS — M6281 Muscle weakness (generalized): Secondary | ICD-10-CM | POA: Diagnosis not present

## 2017-10-26 DIAGNOSIS — J449 Chronic obstructive pulmonary disease, unspecified: Secondary | ICD-10-CM | POA: Diagnosis not present

## 2017-10-26 DIAGNOSIS — K5900 Constipation, unspecified: Secondary | ICD-10-CM | POA: Diagnosis not present

## 2017-10-26 DIAGNOSIS — R001 Bradycardia, unspecified: Secondary | ICD-10-CM | POA: Diagnosis not present

## 2017-10-26 DIAGNOSIS — R05 Cough: Secondary | ICD-10-CM | POA: Diagnosis not present

## 2017-10-26 DIAGNOSIS — J069 Acute upper respiratory infection, unspecified: Secondary | ICD-10-CM | POA: Diagnosis not present

## 2017-10-26 DIAGNOSIS — I5042 Chronic combined systolic (congestive) and diastolic (congestive) heart failure: Secondary | ICD-10-CM | POA: Diagnosis not present

## 2017-10-26 DIAGNOSIS — N390 Urinary tract infection, site not specified: Secondary | ICD-10-CM | POA: Diagnosis not present

## 2017-10-26 DIAGNOSIS — R42 Dizziness and giddiness: Secondary | ICD-10-CM | POA: Diagnosis not present

## 2017-10-26 DIAGNOSIS — I10 Essential (primary) hypertension: Secondary | ICD-10-CM | POA: Diagnosis not present

## 2017-10-26 DIAGNOSIS — F5105 Insomnia due to other mental disorder: Secondary | ICD-10-CM | POA: Diagnosis not present

## 2017-11-20 DIAGNOSIS — R001 Bradycardia, unspecified: Secondary | ICD-10-CM | POA: Diagnosis not present

## 2017-11-20 DIAGNOSIS — R42 Dizziness and giddiness: Secondary | ICD-10-CM | POA: Diagnosis not present

## 2017-11-20 DIAGNOSIS — Z712 Person consulting for explanation of examination or test findings: Secondary | ICD-10-CM | POA: Diagnosis not present

## 2017-11-20 DIAGNOSIS — I5042 Chronic combined systolic (congestive) and diastolic (congestive) heart failure: Secondary | ICD-10-CM | POA: Diagnosis not present

## 2017-11-20 DIAGNOSIS — R944 Abnormal results of kidney function studies: Secondary | ICD-10-CM | POA: Diagnosis not present

## 2017-11-20 DIAGNOSIS — I10 Essential (primary) hypertension: Secondary | ICD-10-CM | POA: Diagnosis not present

## 2017-11-20 DIAGNOSIS — J449 Chronic obstructive pulmonary disease, unspecified: Secondary | ICD-10-CM | POA: Diagnosis not present

## 2017-11-20 DIAGNOSIS — F5105 Insomnia due to other mental disorder: Secondary | ICD-10-CM | POA: Diagnosis not present

## 2017-11-20 DIAGNOSIS — K5900 Constipation, unspecified: Secondary | ICD-10-CM | POA: Diagnosis not present

## 2017-11-20 DIAGNOSIS — Z87440 Personal history of urinary (tract) infections: Secondary | ICD-10-CM | POA: Diagnosis not present

## 2017-11-20 DIAGNOSIS — R05 Cough: Secondary | ICD-10-CM | POA: Diagnosis not present

## 2017-11-20 DIAGNOSIS — R600 Localized edema: Secondary | ICD-10-CM | POA: Diagnosis not present

## 2017-11-20 DIAGNOSIS — J069 Acute upper respiratory infection, unspecified: Secondary | ICD-10-CM | POA: Diagnosis not present

## 2017-11-20 DIAGNOSIS — M6281 Muscle weakness (generalized): Secondary | ICD-10-CM | POA: Diagnosis not present

## 2017-12-19 LAB — CUP PACEART INCLINIC DEVICE CHECK
Battery Voltage: 2.92 V
Brady Statistic AP VP Percent: 0.41 %
Brady Statistic AP VS Percent: 83.65 %
Brady Statistic AS VP Percent: 0.13 %
Brady Statistic RV Percent Paced: 0.57 %
Implantable Lead Implant Date: 20100309
Implantable Lead Implant Date: 20100309
Implantable Lead Location: 753860
Implantable Lead Model: 5076
Implantable Lead Model: 5076
Lead Channel Impedance Value: 768 Ohm
Lead Channel Sensing Intrinsic Amplitude: 1.905 mV
Lead Channel Setting Pacing Amplitude: 2 V
Lead Channel Setting Pacing Pulse Width: 0.8 ms
MDC IDC LEAD LOCATION: 753859
MDC IDC MSMT LEADCHNL RA IMPEDANCE VALUE: 424 Ohm
MDC IDC MSMT LEADCHNL RV SENSING INTR AMPL: 20.154 mV
MDC IDC PG IMPLANT DT: 20100309
MDC IDC SESS DTM: 20180822152054
MDC IDC SET LEADCHNL RV PACING AMPLITUDE: 2.5 V
MDC IDC SET LEADCHNL RV SENSING SENSITIVITY: 0.9 mV
MDC IDC STAT BRADY AS VS PERCENT: 15.81 %
MDC IDC STAT BRADY RA PERCENT PACED: 83.74 %

## 2017-12-20 ENCOUNTER — Telehealth: Payer: Self-pay | Admitting: Cardiology

## 2017-12-20 ENCOUNTER — Ambulatory Visit (INDEPENDENT_AMBULATORY_CARE_PROVIDER_SITE_OTHER): Payer: Medicare Other | Admitting: *Deleted

## 2017-12-20 DIAGNOSIS — I495 Sick sinus syndrome: Secondary | ICD-10-CM

## 2017-12-20 NOTE — Progress Notes (Signed)
Remote pacemaker transmission.   

## 2017-12-20 NOTE — Telephone Encounter (Signed)
Spoke w/ pt and informed her that her device has reached ERI. Informed her that a scheduler will call her to schedule an appt w/ MD and at the appt the procedure will be discussed and the procedure will be scheduled for another day. Pt verbalized understanding.

## 2017-12-25 DIAGNOSIS — R05 Cough: Secondary | ICD-10-CM | POA: Diagnosis not present

## 2017-12-25 DIAGNOSIS — R001 Bradycardia, unspecified: Secondary | ICD-10-CM | POA: Diagnosis not present

## 2017-12-25 LAB — CUP PACEART REMOTE DEVICE CHECK
Brady Statistic AP VS Percent: 54.62 %
Brady Statistic AS VP Percent: 13.19 %
Brady Statistic RA Percent Paced: 54.69 %
Brady Statistic RV Percent Paced: 14.25 %
Date Time Interrogation Session: 20190220160247
Implantable Lead Implant Date: 20100309
Implantable Lead Location: 753860
Implantable Lead Model: 5076
Implantable Pulse Generator Implant Date: 20100309
Lead Channel Impedance Value: 424 Ohm
Lead Channel Impedance Value: 816 Ohm
Lead Channel Sensing Intrinsic Amplitude: 18.524 mV
Lead Channel Setting Pacing Amplitude: 2.5 V
Lead Channel Setting Pacing Pulse Width: 0.8 ms
MDC IDC LEAD IMPLANT DT: 20100309
MDC IDC LEAD LOCATION: 753859
MDC IDC MSMT BATTERY VOLTAGE: 2.9 V
MDC IDC MSMT LEADCHNL RA SENSING INTR AMPL: 2.554 mV
MDC IDC SET LEADCHNL RV SENSING SENSITIVITY: 0.9 mV
MDC IDC STAT BRADY AP VP PERCENT: 0.54 %
MDC IDC STAT BRADY AS VS PERCENT: 31.65 %

## 2017-12-26 ENCOUNTER — Other Ambulatory Visit: Payer: Self-pay | Admitting: *Deleted

## 2017-12-26 MED ORDER — POTASSIUM CHLORIDE ER 10 MEQ PO TBCR: EXTENDED_RELEASE_TABLET | ORAL | 1 refills | Status: DC

## 2018-01-03 ENCOUNTER — Encounter: Payer: Self-pay | Admitting: Physician Assistant

## 2018-01-07 NOTE — Progress Notes (Signed)
Cardiology Office Note Date:  01/10/2018  Patient ID:  Amy [name], DOB 1922-08-23, MRN [id number] PCP:  Celene Squibb, MD  Cardiologist:  Dr. Sallyanne Kuster    Chief Complaint: device reached ERI  History of Present Illness: Amy [name] is a 84 y.o. female with history of CAD (remote CABG), PAFib, Sinus node dysfunction w/PPM, anxiety, arthritis, CRI (III), HTN and orthostatic hypotension, HLD.  She comes in today to be seen for Dr. Sallyanne Kuster, noting his pacer had reached ERI via a remote transmission.  She last saw him in Aug 2018, at that time with worsening exertional capacity, generalized weakness, and edema/weight gain.  Device function was intact, very low Af burden, and noted NSVT not new for her.  Resumed on diuretic tx  She comes today accompanied by her daughter.  All-in-all doing well, she is limited by orthopedic aches/pains, ambulates with a rolling walker.  She has chronic SOB that she suspects is 2/2 remote asbestos exposure in the 70's, this is chronic and unchanged.  She denies any kind of CP, no palpitations, no near syncope or syncope.  She was having some weak spells that correlated with low BP's, her PMD lowered and eventually stopped her ARB with improvement in her BP.  She feels at her baseline, she hasnot appreciated any symptoms or change in her exertional capacity in the last 2 months.  They did notice when checking her BP, her HR went from always 70-75 to 60's, but again without any symptoms.  She brings a record of her home BP generally 110-115/55-65, some higher, rarely lower, home weights have been stable 172-174 for the last couple months.   Device information: MDT EnRhythm, dual chamber PPM, implanted 01/06/2009 (Dr. Elisabeth Cara)  Past Medical History:  Diagnosis Date  . Anxiety   . Arthritis   . Asthma   . CAD in native artery    s/p CABG x 3; Last Myoview in 07/2009 - non-ischemic; Echo 03/2012  Aortic Sclerosis with normal EF.  . CKD (chronic kidney disease),  stage III ([place])   . DVT (deep vein thrombosis) in pregnancy ([place])   . Esophageal perforation    9/13  . Fall 07/21/2014   FALL WITH INJURY  . Fracture of head of humerus   . Fracture of hip ([place]) 07/21/2014   LEFT  . GERD (gastroesophageal reflux disease)   . Hypercholesteremia   . Hypertension   . Hypothyroidism   . Mediastinitis    s/p drainage  . Myocardial infarction [facility])    Inferior STEMI 07/2008 - PCI of prox RCA followed byu CABG x 3 in 9/'09  . Pacemaker   . PAF (paroxysmal atrial fibrillation) ([place])   . Paroxysmal atrial flutter ([place]) 11/26/2014  . Pneumonia   . Shortness of breath   . SSS (sick sinus syndrome) ([place]) 11/26/2014  . Status post hip hemiarthroplasty left hip by Dr Ronnie Derby 08/08/2012    Past Surgical History:  Procedure Laterality Date  . CARPAL TUNNEL RELEASE  08/08/2012   Procedure: CARPAL TUNNEL RELEASE;  Surgeon: Schuyler Amor, MD;  Location: [place];  Service: Orthopedics;  Laterality: Left;  . CORONARY ARTERY BYPASS GRAFT  2009   LIMA-LAD, SVG-RI, SVG-stented RCA  . ESOPHAGOGASTRODUODENOSCOPY  08/10/2012   Procedure: ESOPHAGOGASTRODUODENOSCOPY (EGD);  Surgeon: Beryle Beams, MD;  Location: [facility] ENDOSCOPY;  Service: Endoscopy;  Laterality: N/A;  . ESOPHAGOSCOPY  08/10/2012   Procedure: ESOPHAGOSCOPY;  Surgeon: [name], MD;  Location: [place];  Service: ENT;  Laterality:  N/A;  . GASTROSTOMY  08/16/2012   Procedure: GASTROSTOMY;  Surgeon: Zenovia Jarred, MD;  Location: [place];  Service: General;  Laterality: N/A;  . HIP ARTHROPLASTY  08/08/2012   Procedure: ARTHROPLASTY BIPOLAR HIP;  Surgeon: Rudean Haskell, MD;  Location: [place];  Service: Orthopedics;  Laterality: Left;  [place]   . JOINT REPLACEMENT    . LEFT HEART CATHETERIZATION WITH CORONARY ANGIOGRAM N/A 04/16/2014   Procedure: LEFT HEART CATHETERIZATION WITH CORONARY ANGIOGRAM;  Surgeon: Wellington Hampshire, MD;  Location: [place] CATH LAB;  Service: Cardiovascular;  Laterality: N/A;  . NM MYOCAR  PERF WALL MOTION  08/14/2009   Normal  . PACEMAKER INSERTION  01/06/2009   Medtronic  . TOTAL HIP ARTHROPLASTY    . WRIST FRACTURE SURGERY  07/2012   left intra articular  with carpel tunnel    Current Outpatient Medications  Medication Sig Dispense Refill  . acetaminophen (TYLENOL) 500 MG tablet Take 1,000 mg by mouth every 6 (six) hours as needed for moderate pain.    Marland Kitchen albuterol (PROVENTIL HFA;VENTOLIN HFA) 108 (90 Base) MCG/ACT inhaler Inhale into the lungs as needed for wheezing or shortness of breath.    Marland Kitchen aspirin EC 81 MG tablet Take 81 mg by mouth daily.    . Cholecalciferol (VITAMIN D3 PO) Take 1 tablet by mouth daily.    . cycloSPORINE (RESTASIS) 0.05 % ophthalmic emulsion Place 1 drop into both eyes 2 (two) times daily.    Marland Kitchen docusate sodium (COLACE) 100 MG capsule Take 100 mg by mouth 2 (two) times daily.    . Fluticasone-Umeclidin-Vilant (TRELEGY ELLIPTA) 100-62.5-25 MCG/INH AEPB Inhale 1 puff into the lungs daily.     . furosemide (LASIX) 40 MG tablet Take 1 tablet (40 mg total) by mouth daily as directed. 60 tablet 3  . Lactobacillus-Inulin ([place] PO) Take by mouth.    . levothyroxine (SYNTHROID, LEVOTHROID) 50 MCG tablet Take 1 tablet by mouth daily.    Marland Kitchen LORazepam (ATIVAN) 1 MG tablet Take 1 mg by mouth daily as needed for sleep.    . metoprolol succinate (TOPROL-XL) 25 MG 24 hr tablet Take 0.5 tablets (12.5 mg total) by mouth daily. 45 tablet 2  . nitroGLYCERIN (NITROSTAT) 0.4 MG SL tablet Place 1 tablet (0.4 mg total) under the tongue every 5 (five) minutes x 3 doses as needed for chest pain. 25 tablet 12  . omeprazole (PRILOSEC) 40 MG capsule TAKE ONE CAPSULE BY MOUTH ONCE DAILY 90 capsule 3  . potassium chloride (K-DUR) 10 MEQ tablet Take 1 tablet (10 mEq total) by mouth daily as directed. 45 tablet 1  . ranitidine (ZANTAC) 150 MG tablet TAKE ONE TABLET BY MOUTH ONCE DAILY AT BEDTIME 90 tablet 3   No current facility-administered medications for  this visit.     Allergies:   Colestipol; Hydrocodone-acetaminophen; Niacin and related; Statins; and Zetia [ezetimibe]   Social History:  The patient  reports that  has never smoked. she has never used smokeless tobacco. She reports that she does not drink alcohol or use drugs.   Family History:  The patient's family history includes Stroke in her father.  ROS:  Please see the history of present illness.  All other systems are reviewed and otherwise negative.   PHYSICAL EXAM:  VS:  BP (!) 130/48   Pulse 73   Ht 5\' 3"  (1.6 m)   Wt 174 lb 6.4 oz (79.1 kg)   SpO2 96%   BMI 30.89 kg/m  BMI:  Body mass index is 30.89 kg/m. Well nourished, well developed, in no acute distress  HEENT: normocephalic, atraumatic  Neck: no JVD, carotid bruits or masses Cardiac:   RRR; no significant murmurs, no rubs, or gallops Lungs:  CTA b/l, no wheezing, rhonchi or rales  Abd: soft, nontender MS: no deformity, age appropriate atrophy Ext:  no edema  Skin: warm and dry, no rash Neuro:  No gross deficits appreciated Psych: euthymic mood, full affect  PPM site is stable, no tethering or discomfort   EKG:  Done today and reviewed by myself: SR, w/V paced beats. PPM interrogation done today and reviewed by myself: battery reached ERI 12/02/17, has reverted to VVI 65, AF burden <0.1%, AP 59%, VP 13% She is asymptomatic at VVI 65, not reprogrammed   07/24/13: TTE Study Conclusions - Left ventricle: The cavity size was moderately reduced. Wall thickness was increased in a pattern of mild LVH. There was mild concentric hypertrophy. Systolic function was normal. The estimated ejection fraction was in the range of 55% to 60%. Wall motion was normal; there were no regional wall motion abnormalities. Doppler parameters are consistent with abnormal left ventricular relaxation (grade 1 diastolic dysfunction). - Aortic valve: Trivial regurgitation. Valve area: 1.47cm^2(VTI). Valve area:  1.33cm^2 (Vmax). - Mitral valve: Calcified annulus. Mildly calcified leaflets anterior. Mild regurgitation. Valve area by continuity equation (using LVOT flow): 2.56cm^2. - Right ventricle: Systolic pressure was increased. - Tricuspid valve: Moderate regurgitation. - Pulmonary arteries: PA peak pressure: 14mm Hg (S). Impressions: - The findings indicate no septal-lateral left ventricular wall dyssynchrony. The right ventricular systolic pressure was increased consistent with mild pulmonary hypertension.  Recent Labs: 05/13/2017: ALT 13; BUN 17; Creatinine, Ser 1.36; Hemoglobin 13.9; Magnesium 1.8; Platelets 190; Potassium 4.8; Sodium 140  No results found for requested labs within last 8760 hours.   CrCl cannot be calculated (Patient's most recent lab result is older than the maximum 21 days allowed.).   Wt Readings from Last 3 Encounters:  01/10/18 174 lb 6.4 oz (79.1 kg)  07/25/17 167 lb (75.8 kg)  06/21/17 169 lb (76.7 kg)     Other studies reviewed: Additional studies/records reviewed today include: summarized above  ASSESSMENT AND PLAN:  1. PPM     device has reached ERI     She is not pacer dependent     generator change procedure, risks and benefits were discussed with the patient,  she would like to proceed  2. CAD     No anginal sounding complaints     In review of notes, not felt to have good medicine options for statin, patient reports myalgias with "all of them"  3. Paroxysmal AFib     Not on a/c 2/2 hx of serious falls, risk felt to outweigh benefit     Low burden  4. HTN     Looks OK, no changes     Reports of some lower readings that have improved     Carries hx of orthostatic hypotension    Disposition: F/u with PPM generator change with Dr. Sallyanne Kuster, the patient would like to get it done soon, will plan for routine post-procedure follow up.   Current medicines are reviewed at length with the patient today.  The patient did not have any  concerns regarding medicines.  Venetia Night, PA-C 01/10/2018 1:47 PM     [address] [place] [address] [phone] (office)  [phone] (fax)

## 2018-01-07 NOTE — H&P (View-Only) (Signed)
Cardiology Office Note Date:  01/10/2018  Patient ID:  Amy [name], DOB [birth year], MRN [id number] PCP:  Celene Squibb, MD  Cardiologist:  Dr. Sallyanne Kuster    Chief Complaint: device reached ERI  History of Present Illness: Amy [name] is a 84 y.o. female with history of CAD (remote CABG), PAFib, Sinus node dysfunction w/PPM, anxiety, arthritis, CRI (III), HTN and orthostatic hypotension, HLD.  She comes in today to be seen for Dr. Sallyanne Kuster, noting his pacer had reached ERI via a remote transmission.  She last saw him in Aug 2018, at that time with worsening exertional capacity, generalized weakness, and edema/weight gain.  Device function was intact, very low Af burden, and noted NSVT not new for her.  Resumed on diuretic tx  She comes today accompanied by her daughter.  All-in-all doing well, she is limited by orthopedic aches/pains, ambulates with a rolling walker.  She has chronic SOB that she suspects is 2/2 remote asbestos exposure in the 70's, this is chronic and unchanged.  She denies any kind of CP, no palpitations, no near syncope or syncope.  She was having some weak spells that correlated with low BP's, her PMD lowered and eventually stopped her ARB with improvement in her BP.  She feels at her baseline, she hasnot appreciated any symptoms or change in her exertional capacity in the last 2 months.  They did notice when checking her BP, her HR went from always 70-75 to 60's, but again without any symptoms.  She brings a record of her home BP generally 110-115/55-65, some higher, rarely lower, home weights have been stable 172-174 for the last couple months.   Device information: MDT EnRhythm, dual chamber PPM, implanted 01/06/2009 (Dr. Elisabeth Cara)  Past Medical History:  Diagnosis Date  . Anxiety   . Arthritis   . Asthma   . CAD in native artery    s/p CABG x 3; Last Myoview in 07/2009 - non-ischemic; Echo 03/2012  Aortic Sclerosis with normal EF.  . CKD (chronic kidney disease),  stage III ([place])   . DVT (deep vein thrombosis) in pregnancy ([place])   . Esophageal perforation    9/13  . Fall 07/21/2014   FALL WITH INJURY  . Fracture of head of humerus   . Fracture of hip ([place]) 07/21/2014   LEFT  . GERD (gastroesophageal reflux disease)   . Hypercholesteremia   . Hypertension   . Hypothyroidism   . Mediastinitis    s/p drainage  . Myocardial infarction [facility])    Inferior STEMI 07/2008 - PCI of prox RCA followed byu CABG x 3 in 9/'09  . Pacemaker   . PAF (paroxysmal atrial fibrillation) ([place])   . Paroxysmal atrial flutter ([place]) 11/26/2014  . Pneumonia   . Shortness of breath   . SSS (sick sinus syndrome) ([place]) 11/26/2014  . Status post hip hemiarthroplasty left hip by Dr Ronnie Derby 08/08/2012    Past Surgical History:  Procedure Laterality Date  . CARPAL TUNNEL RELEASE  08/08/2012   Procedure: CARPAL TUNNEL RELEASE;  Surgeon: Schuyler Amor, MD;  Location: [place];  Service: Orthopedics;  Laterality: Left;  . CORONARY ARTERY BYPASS GRAFT  2009   LIMA-LAD, SVG-RI, SVG-stented RCA  . ESOPHAGOGASTRODUODENOSCOPY  08/10/2012   Procedure: ESOPHAGOGASTRODUODENOSCOPY (EGD);  Surgeon: Beryle Beams, MD;  Location: [facility] ENDOSCOPY;  Service: Endoscopy;  Laterality: N/A;  . ESOPHAGOSCOPY  08/10/2012   Procedure: ESOPHAGOSCOPY;  Surgeon: [name], MD;  Location: [place];  Service: ENT;  Laterality:  N/A;  . GASTROSTOMY  08/16/2012   Procedure: GASTROSTOMY;  Surgeon: Zenovia Jarred, MD;  Location: [place];  Service: General;  Laterality: N/A;  . HIP ARTHROPLASTY  08/08/2012   Procedure: ARTHROPLASTY BIPOLAR HIP;  Surgeon: Rudean Haskell, MD;  Location: [place];  Service: Orthopedics;  Laterality: Left;  [place]   . JOINT REPLACEMENT    . LEFT HEART CATHETERIZATION WITH CORONARY ANGIOGRAM N/A 04/16/2014   Procedure: LEFT HEART CATHETERIZATION WITH CORONARY ANGIOGRAM;  Surgeon: Wellington Hampshire, MD;  Location: [place] CATH LAB;  Service: Cardiovascular;  Laterality: N/A;  . NM MYOCAR  PERF WALL MOTION  08/14/2009   Normal  . PACEMAKER INSERTION  01/06/2009   Medtronic  . TOTAL HIP ARTHROPLASTY    . WRIST FRACTURE SURGERY  07/2012   left intra articular  with carpel tunnel    Current Outpatient Medications  Medication Sig Dispense Refill  . acetaminophen (TYLENOL) 500 MG tablet Take 1,000 mg by mouth every 6 (six) hours as needed for moderate pain.    Marland Kitchen albuterol (PROVENTIL HFA;VENTOLIN HFA) 108 (90 Base) MCG/ACT inhaler Inhale into the lungs as needed for wheezing or shortness of breath.    Marland Kitchen aspirin EC 81 MG tablet Take 81 mg by mouth daily.    . Cholecalciferol (VITAMIN D3 PO) Take 1 tablet by mouth daily.    . cycloSPORINE (RESTASIS) 0.05 % ophthalmic emulsion Place 1 drop into both eyes 2 (two) times daily.    Marland Kitchen docusate sodium (COLACE) 100 MG capsule Take 100 mg by mouth 2 (two) times daily.    . Fluticasone-Umeclidin-Vilant (TRELEGY ELLIPTA) 100-62.5-25 MCG/INH AEPB Inhale 1 puff into the lungs daily.     . furosemide (LASIX) 40 MG tablet Take 1 tablet (40 mg total) by mouth daily as directed. 60 tablet 3  . Lactobacillus-Inulin (Red Bud PO) Take by mouth.    . levothyroxine (SYNTHROID, LEVOTHROID) 50 MCG tablet Take 1 tablet by mouth daily.    Marland Kitchen LORazepam (ATIVAN) 1 MG tablet Take 1 mg by mouth daily as needed for sleep.    . metoprolol succinate (TOPROL-XL) 25 MG 24 hr tablet Take 0.5 tablets (12.5 mg total) by mouth daily. 45 tablet 2  . nitroGLYCERIN (NITROSTAT) 0.4 MG SL tablet Place 1 tablet (0.4 mg total) under the tongue every 5 (five) minutes x 3 doses as needed for chest pain. 25 tablet 12  . omeprazole (PRILOSEC) 40 MG capsule TAKE ONE CAPSULE BY MOUTH ONCE DAILY 90 capsule 3  . potassium chloride (K-DUR) 10 MEQ tablet Take 1 tablet (10 mEq total) by mouth daily as directed. 45 tablet 1  . ranitidine (ZANTAC) 150 MG tablet TAKE ONE TABLET BY MOUTH ONCE DAILY AT BEDTIME 90 tablet 3   No current facility-administered medications for  this visit.     Allergies:   Colestipol; Hydrocodone-acetaminophen; Niacin and related; Statins; and Zetia [ezetimibe]   Social History:  The patient  reports that  has never smoked. she has never used smokeless tobacco. She reports that she does not drink alcohol or use drugs.   Family History:  The patient's family history includes Stroke in her father.  ROS:  Please see the history of present illness.  All other systems are reviewed and otherwise negative.   PHYSICAL EXAM:  VS:  BP (!) 130/48   Pulse 73   Ht 5\' 3"  (1.6 m)   Wt 174 lb 6.4 oz (79.1 kg)   SpO2 96%   BMI 30.89 kg/m  BMI:  Body mass index is 30.89 kg/m. Well nourished, well developed, in no acute distress  HEENT: normocephalic, atraumatic  Neck: no JVD, carotid bruits or masses Cardiac:   RRR; no significant murmurs, no rubs, or gallops Lungs:  CTA b/l, no wheezing, rhonchi or rales  Abd: soft, nontender MS: no deformity, age appropriate atrophy Ext:  no edema  Skin: warm and dry, no rash Neuro:  No gross deficits appreciated Psych: euthymic mood, full affect  PPM site is stable, no tethering or discomfort   EKG:  Done today and reviewed by myself: SR, w/V paced beats. PPM interrogation done today and reviewed by myself: battery reached ERI 12/02/17, has reverted to VVI 65, AF burden <0.1%, AP 59%, VP 13% She is asymptomatic at VVI 65, not reprogrammed   07/24/13: TTE Study Conclusions - Left ventricle: The cavity size was moderately reduced. Wall thickness was increased in a pattern of mild LVH. There was mild concentric hypertrophy. Systolic function was normal. The estimated ejection fraction was in the range of 55% to 60%. Wall motion was normal; there were no regional wall motion abnormalities. Doppler parameters are consistent with abnormal left ventricular relaxation (grade 1 diastolic dysfunction). - Aortic valve: Trivial regurgitation. Valve area: 1.47cm^2(VTI). Valve area:  1.33cm^2 (Vmax). - Mitral valve: Calcified annulus. Mildly calcified leaflets anterior. Mild regurgitation. Valve area by continuity equation (using LVOT flow): 2.56cm^2. - Right ventricle: Systolic pressure was increased. - Tricuspid valve: Moderate regurgitation. - Pulmonary arteries: PA peak pressure: 60mm Hg (S). Impressions: - The findings indicate no septal-lateral left ventricular wall dyssynchrony. The right ventricular systolic pressure was increased consistent with mild pulmonary hypertension.  Recent Labs: 05/13/2017: ALT 13; BUN 17; Creatinine, Ser 1.36; Hemoglobin 13.9; Magnesium 1.8; Platelets 190; Potassium 4.8; Sodium 140  No results found for requested labs within last 8760 hours.   CrCl cannot be calculated (Patient's most recent lab result is older than the maximum 21 days allowed.).   Wt Readings from Last 3 Encounters:  01/10/18 174 lb 6.4 oz (79.1 kg)  07/25/17 167 lb (75.8 kg)  06/21/17 169 lb (76.7 kg)     Other studies reviewed: Additional studies/records reviewed today include: summarized above  ASSESSMENT AND PLAN:  1. PPM     device has reached ERI     She is not pacer dependent     generator change procedure, risks and benefits were discussed with the patient,  she would like to proceed  2. CAD     No anginal sounding complaints     In review of notes, not felt to have good medicine options for statin, patient reports myalgias with "all of them"  3. Paroxysmal AFib     Not on a/c 2/2 hx of serious falls, risk felt to outweigh benefit     Low burden  4. HTN     Looks OK, no changes     Reports of some lower readings that have improved     Carries hx of orthostatic hypotension    Disposition: F/u with PPM generator change with Dr. Sallyanne Kuster, the patient would like to get it done soon, will plan for routine post-procedure follow up.   Current medicines are reviewed at length with the patient today.  The patient did not have any  concerns regarding medicines.  Venetia Night, PA-C 01/10/2018 1:47 PM     [address] [place] [address] [phone] (office)  [phone] (fax)

## 2018-01-10 ENCOUNTER — Encounter: Payer: Self-pay | Admitting: Physician Assistant

## 2018-01-10 ENCOUNTER — Ambulatory Visit (INDEPENDENT_AMBULATORY_CARE_PROVIDER_SITE_OTHER): Payer: Medicare Other | Admitting: Physician Assistant

## 2018-01-10 ENCOUNTER — Encounter: Payer: Self-pay | Admitting: *Deleted

## 2018-01-10 VITALS — BP 130/48 | HR 65 | Ht 63.0 in | Wt 174.4 lb

## 2018-01-10 DIAGNOSIS — Z45010 Encounter for checking and testing of cardiac pacemaker pulse generator [battery]: Secondary | ICD-10-CM | POA: Diagnosis not present

## 2018-01-10 DIAGNOSIS — I10 Essential (primary) hypertension: Secondary | ICD-10-CM | POA: Diagnosis not present

## 2018-01-10 DIAGNOSIS — I5033 Acute on chronic diastolic (congestive) heart failure: Secondary | ICD-10-CM | POA: Diagnosis not present

## 2018-01-10 DIAGNOSIS — I480 Paroxysmal atrial fibrillation: Secondary | ICD-10-CM

## 2018-01-10 DIAGNOSIS — I2510 Atherosclerotic heart disease of native coronary artery without angina pectoris: Secondary | ICD-10-CM

## 2018-01-10 NOTE — Patient Instructions (Addendum)
Medication Instructions:   Your physician recommends that you continue on your current medications as directed. Please refer to the Current Medication list given to you today.    If you need a refill on your cardiac medications before your next appointment, please call your pharmacy.  Labwork:  CBC  PTT AND PT  AND BMET  TODAY    Testing/Procedures:   SEE  LETTER FOR  GEN CHANGE ON  01-17-18    Follow-Up: AFTER  01-17-18   14 DAYS WOUND CHECK DEVICE CLINIC  [place] WITH DR CROITORU   Any Other Special Instructions Will Be Listed Below (If Applicable).

## 2018-01-11 DIAGNOSIS — H04123 Dry eye syndrome of bilateral lacrimal glands: Secondary | ICD-10-CM | POA: Diagnosis not present

## 2018-01-11 DIAGNOSIS — H524 Presbyopia: Secondary | ICD-10-CM | POA: Diagnosis not present

## 2018-01-11 DIAGNOSIS — H40013 Open angle with borderline findings, low risk, bilateral: Secondary | ICD-10-CM | POA: Diagnosis not present

## 2018-01-11 DIAGNOSIS — H16103 Unspecified superficial keratitis, bilateral: Secondary | ICD-10-CM | POA: Diagnosis not present

## 2018-01-11 LAB — CBC
HEMOGLOBIN: 13.2 g/dL (ref 11.1–15.9)
Hematocrit: 40.4 % (ref 34.0–46.6)
MCH: 29.5 pg (ref 26.6–33.0)
MCHC: 32.7 g/dL (ref 31.5–35.7)
MCV: 90 fL (ref 79–97)
Platelets: 270 10*3/uL (ref 150–379)
RBC: 4.48 x10E6/uL (ref 3.77–5.28)
RDW: 14 % (ref 12.3–15.4)
WBC: 8.7 10*3/uL (ref 3.4–10.8)

## 2018-01-11 LAB — BASIC METABOLIC PANEL
BUN/Creatinine Ratio: 20 (ref 12–28)
BUN: 31 mg/dL (ref 10–36)
CALCIUM: 10.3 mg/dL (ref 8.7–10.3)
CHLORIDE: 97 mmol/L (ref 96–106)
CO2: 24 mmol/L (ref 20–29)
Creatinine, Ser: 1.57 mg/dL — ABNORMAL HIGH (ref 0.57–1.00)
GFR calc Af Amer: 32 mL/min/{1.73_m2} — ABNORMAL LOW (ref >59)
GFR calc non Af Amer: 28 mL/min/{1.73_m2} — ABNORMAL LOW (ref >59)
Glucose: 93 mg/dL (ref 65–99)
POTASSIUM: 4.6 mmol/L (ref 3.5–5.2)
Sodium: 141 mmol/L (ref 134–144)

## 2018-01-11 LAB — PT AND PTT
INR: 0.9 (ref 0.8–1.2)
PROTHROMBIN TIME: 9.8 s (ref 9.1–12.0)
aPTT: 26 s (ref 24–33)

## 2018-01-14 ENCOUNTER — Other Ambulatory Visit (HOSPITAL_COMMUNITY): Payer: Self-pay | Admitting: Internal Medicine

## 2018-01-14 ENCOUNTER — Ambulatory Visit (HOSPITAL_COMMUNITY)
Admission: RE | Admit: 2018-01-14 | Discharge: 2018-01-14 | Disposition: A | Discharge status: Home / Self Care | Payer: Medicare Other | Source: Ambulatory Visit | Attending: Internal Medicine | Admitting: Internal Medicine

## 2018-01-14 DIAGNOSIS — I10 Essential (primary) hypertension: Secondary | ICD-10-CM | POA: Diagnosis not present

## 2018-01-14 DIAGNOSIS — I5042 Chronic combined systolic (congestive) and diastolic (congestive) heart failure: Secondary | ICD-10-CM | POA: Diagnosis not present

## 2018-01-14 DIAGNOSIS — M7989 Other specified soft tissue disorders: Secondary | ICD-10-CM | POA: Diagnosis not present

## 2018-01-14 DIAGNOSIS — J449 Chronic obstructive pulmonary disease, unspecified: Secondary | ICD-10-CM | POA: Diagnosis not present

## 2018-01-14 DIAGNOSIS — R001 Bradycardia, unspecified: Secondary | ICD-10-CM | POA: Diagnosis not present

## 2018-01-14 DIAGNOSIS — R52 Pain, unspecified: Secondary | ICD-10-CM

## 2018-01-14 DIAGNOSIS — M79672 Pain in left foot: Secondary | ICD-10-CM | POA: Insufficient documentation

## 2018-01-14 DIAGNOSIS — K5900 Constipation, unspecified: Secondary | ICD-10-CM | POA: Diagnosis not present

## 2018-01-14 DIAGNOSIS — R05 Cough: Secondary | ICD-10-CM | POA: Diagnosis not present

## 2018-01-14 DIAGNOSIS — Z712 Person consulting for explanation of examination or test findings: Secondary | ICD-10-CM | POA: Diagnosis not present

## 2018-01-14 DIAGNOSIS — R944 Abnormal results of kidney function studies: Secondary | ICD-10-CM | POA: Diagnosis not present

## 2018-01-14 DIAGNOSIS — M1A00X0 Idiopathic chronic gout, unspecified site, without tophus (tophi): Secondary | ICD-10-CM | POA: Diagnosis not present

## 2018-01-14 DIAGNOSIS — J069 Acute upper respiratory infection, unspecified: Secondary | ICD-10-CM | POA: Diagnosis not present

## 2018-01-14 DIAGNOSIS — M6281 Muscle weakness (generalized): Secondary | ICD-10-CM | POA: Diagnosis not present

## 2018-01-15 ENCOUNTER — Telehealth: Payer: Self-pay | Admitting: Physician Assistant

## 2018-01-15 NOTE — Telephone Encounter (Signed)
New Message  Pt's daughter states that the pt was at urgent care due to foot swelling and they prescribed her Prednisone. They want to make sure that it wont interfere with the procedure on 3/20. Please call

## 2018-01-16 NOTE — Telephone Encounter (Signed)
Procedure rescheduled for 01/24/18. Daughter notified.

## 2018-01-16 NOTE — Telephone Encounter (Signed)
Will try to reschedule for next week MCr

## 2018-01-19 DIAGNOSIS — R600 Localized edema: Secondary | ICD-10-CM | POA: Diagnosis not present

## 2018-01-24 ENCOUNTER — Ambulatory Visit (HOSPITAL_COMMUNITY)
Admission: RE | Disposition: A | Discharge status: Home / Self Care | Payer: Self-pay | Source: Ambulatory Visit | Attending: Cardiovascular Disease

## 2018-01-24 ENCOUNTER — Ambulatory Visit (HOSPITAL_COMMUNITY)
Admission: RE | Admit: 2018-01-24 | Discharge: 2018-01-24 | Disposition: A | Discharge status: Home / Self Care | Payer: Medicare Other | Source: Ambulatory Visit | Attending: Cardiovascular Disease | Admitting: Cardiovascular Disease

## 2018-01-24 DIAGNOSIS — I129 Hypertensive chronic kidney disease with stage 1 through stage 4 chronic kidney disease, or unspecified chronic kidney disease: Secondary | ICD-10-CM | POA: Diagnosis not present

## 2018-01-24 DIAGNOSIS — M1990 Unspecified osteoarthritis, unspecified site: Secondary | ICD-10-CM | POA: Diagnosis not present

## 2018-01-24 DIAGNOSIS — Z888 Allergy status to other drugs, medicaments and biological substances status: Secondary | ICD-10-CM | POA: Diagnosis not present

## 2018-01-24 DIAGNOSIS — Z01818 Encounter for other preprocedural examination: Secondary | ICD-10-CM | POA: Insufficient documentation

## 2018-01-24 DIAGNOSIS — E039 Hypothyroidism, unspecified: Secondary | ICD-10-CM | POA: Diagnosis not present

## 2018-01-24 DIAGNOSIS — I252 Old myocardial infarction: Secondary | ICD-10-CM | POA: Insufficient documentation

## 2018-01-24 DIAGNOSIS — F419 Anxiety disorder, unspecified: Secondary | ICD-10-CM | POA: Diagnosis not present

## 2018-01-24 DIAGNOSIS — Z885 Allergy status to narcotic agent status: Secondary | ICD-10-CM | POA: Diagnosis not present

## 2018-01-24 DIAGNOSIS — J45909 Unspecified asthma, uncomplicated: Secondary | ICD-10-CM | POA: Diagnosis not present

## 2018-01-24 DIAGNOSIS — Z4502 Encounter for adjustment and management of automatic implantable cardiac defibrillator: Secondary | ICD-10-CM | POA: Diagnosis not present

## 2018-01-24 DIAGNOSIS — E7800 Pure hypercholesterolemia, unspecified: Secondary | ICD-10-CM | POA: Diagnosis not present

## 2018-01-24 DIAGNOSIS — I2510 Atherosclerotic heart disease of native coronary artery without angina pectoris: Secondary | ICD-10-CM | POA: Diagnosis not present

## 2018-01-24 DIAGNOSIS — Z951 Presence of aortocoronary bypass graft: Secondary | ICD-10-CM | POA: Insufficient documentation

## 2018-01-24 DIAGNOSIS — Z79899 Other long term (current) drug therapy: Secondary | ICD-10-CM | POA: Diagnosis not present

## 2018-01-24 DIAGNOSIS — Z86718 Personal history of other venous thrombosis and embolism: Secondary | ICD-10-CM | POA: Diagnosis not present

## 2018-01-24 DIAGNOSIS — Z79890 Hormone replacement therapy (postmenopausal): Secondary | ICD-10-CM | POA: Diagnosis not present

## 2018-01-24 DIAGNOSIS — I495 Sick sinus syndrome: Secondary | ICD-10-CM

## 2018-01-24 DIAGNOSIS — N183 Chronic kidney disease, stage 3 (moderate): Secondary | ICD-10-CM | POA: Diagnosis not present

## 2018-01-24 DIAGNOSIS — I4892 Unspecified atrial flutter: Secondary | ICD-10-CM

## 2018-01-24 DIAGNOSIS — Z45010 Encounter for checking and testing of cardiac pacemaker pulse generator [battery]: Secondary | ICD-10-CM | POA: Diagnosis not present

## 2018-01-24 DIAGNOSIS — Z7982 Long term (current) use of aspirin: Secondary | ICD-10-CM | POA: Insufficient documentation

## 2018-01-24 DIAGNOSIS — I480 Paroxysmal atrial fibrillation: Secondary | ICD-10-CM | POA: Insufficient documentation

## 2018-01-24 HISTORY — PX: PPM GENERATOR CHANGEOUT: EP1233

## 2018-01-24 LAB — SURGICAL PCR SCREEN
MRSA, PCR: NEGATIVE
Staphylococcus aureus: NEGATIVE

## 2018-01-24 SURGERY — PPM GENERATOR CHANGEOUT

## 2018-01-24 MED ORDER — LIDOCAINE HCL (PF) 1 % IJ SOLN
INTRAMUSCULAR | Status: DC | PRN
  Administered 2018-01-24: 45 mL

## 2018-01-24 MED ORDER — SODIUM CHLORIDE 0.9 % IV SOLN
INTRAVENOUS | Status: DC
  Administered 2018-01-24: 12:00:00 via INTRAVENOUS

## 2018-01-24 MED ORDER — HEPARIN (PORCINE) IN NACL 2-0.9 UNIT/ML-% IJ SOLN
INTRAMUSCULAR | Status: AC
  Filled 2018-01-24: qty 1000

## 2018-01-24 MED ORDER — CEFAZOLIN SODIUM-DEXTROSE 2-4 GM/100ML-% IV SOLN
2.0000 g | INTRAVENOUS | Status: AC
  Administered 2018-01-24: 2 g via INTRAVENOUS

## 2018-01-24 MED ORDER — MUPIROCIN 2 % EX OINT
1.0000 "application " | TOPICAL_OINTMENT | Freq: Once | CUTANEOUS | Status: AC
  Administered 2018-01-24: 1 via TOPICAL

## 2018-01-24 MED ORDER — FENTANYL CITRATE (PF) 100 MCG/2ML IJ SOLN
INTRAMUSCULAR | Status: AC
  Filled 2018-01-24: qty 2

## 2018-01-24 MED ORDER — ONDANSETRON HCL 4 MG/2ML IJ SOLN: 4.0000 mg | Freq: Four times a day (QID) | INTRAMUSCULAR | Status: DC | PRN

## 2018-01-24 MED ORDER — LIDOCAINE HCL (PF) 1 % IJ SOLN
INTRAMUSCULAR | Status: AC
  Filled 2018-01-24: qty 60

## 2018-01-24 MED ORDER — SODIUM CHLORIDE 0.9 % IR SOLN
80.0000 mg | Status: AC
  Administered 2018-01-24: 80 mg

## 2018-01-24 MED ORDER — CEFAZOLIN SODIUM-DEXTROSE 2-4 GM/100ML-% IV SOLN
INTRAVENOUS | Status: AC
  Filled 2018-01-24: qty 100

## 2018-01-24 MED ORDER — MIDAZOLAM HCL 5 MG/5ML IJ SOLN
INTRAMUSCULAR | Status: AC
  Filled 2018-01-24: qty 5

## 2018-01-24 MED ORDER — MUPIROCIN 2 % EX OINT
TOPICAL_OINTMENT | CUTANEOUS | Status: AC
  Administered 2018-01-24: 1 via TOPICAL
  Filled 2018-01-24: qty 22

## 2018-01-24 MED ORDER — SODIUM CHLORIDE 0.9 % IR SOLN
Status: AC
  Filled 2018-01-24: qty 2

## 2018-01-24 MED ORDER — ACETAMINOPHEN 325 MG PO TABS: 325.0000 mg | ORAL_TABLET | ORAL | Status: DC | PRN

## 2018-01-24 MED ORDER — FENTANYL CITRATE (PF) 100 MCG/2ML IJ SOLN
INTRAMUSCULAR | Status: DC | PRN
  Administered 2018-01-24: 25 ug via INTRAVENOUS

## 2018-01-24 MED ORDER — MIDAZOLAM HCL 5 MG/5ML IJ SOLN
INTRAMUSCULAR | Status: DC | PRN
  Administered 2018-01-24: 1 mg via INTRAVENOUS

## 2018-01-24 MED ORDER — CHLORHEXIDINE GLUCONATE 4 % EX LIQD: 60.0000 mL | Freq: Once | CUTANEOUS | Status: DC

## 2018-01-24 SURGICAL SUPPLY — 5 items
CABLE SURGICAL S-101-97-12 (CABLE) ×2 IMPLANT
IPG PACE AZUR XT DR MRI W1DR01 (Pacemaker) ×1 IMPLANT
PACE AZURE XT DR MRI W1DR01 (Pacemaker) ×2 IMPLANT
PAD DEFIB LIFELINK (PAD) ×2 IMPLANT
TRAY PACEMAKER INSERTION (PACKS) ×2 IMPLANT

## 2018-01-24 NOTE — Interval H&P Note (Signed)
History and Physical Interval Note:  01/24/2018 1:40 PM  Amy [name]  has presented today for surgery, with the diagnosis of ERI  The various methods of treatment have been discussed with the patient and family. After consideration of risks, benefits and other options for treatment, the patient has consented to  Procedure(s): PPM GENERATOR CHANGEOUT (N/A) as a surgical intervention .  The patient's history has been reviewed, patient examined, no change in status, stable for surgery.  I have reviewed the patient's chart and labs.  Questions were answered to the patient's satisfaction.     Haroon Shatto

## 2018-01-24 NOTE — Discharge Instructions (Signed)

## 2018-01-24 NOTE — Op Note (Signed)
Procedure report  Procedure performed:  1. Dual chamber pacemaker generator changeout  2. Light sedation  Reason for procedure:  1. Device generator at elective replacement interval  Procedure performed by:  Sanda Klein, MD  Complications:  None  Estimated blood loss:  <5 mL  Medications administered during procedure:  Ancef 2 g intravenously, lidocaine 1% 30 mL locally, fentanyl 25 mcg intravenously, Versed 1 mg intravenously During this procedure the patient is administered a total of Versed 1 mg and Fentanyl 25 mcg to achieve and maintain moderate conscious sedation.  The patient's heart rate, blood pressure, and oxygen saturation are monitored continuously during the procedure. The period of conscious sedation is 26 minutes, of which I was present face-to-face 100% of this time.   Device details:   New Generator Medtronic Azure XT DR model number [id number], serial number [id number] Right atrial lead (chronic) Medtronic , model number [id number], serial number [id number] (implanted 01/06/2009) Right ventricular lead (chronic)  Medtronic, model number [id number], serial number [id number] (implanted 01/06/2009)   Explanted generator Medtronic EnRhythm,  model number [id number], serial number  [id number] (implanted 01/06/2009)  Procedure details:  After the risks and benefits of the procedure were discussed the patient provided informed consent. She was brought to the cardiac catheter lab in the fasting state. The patient was prepped and draped in usual sterile fashion. Local anesthesia with 1% lidocaine was administered to to the left infraclavicular area. A 5-6cm horizontal incision was made parallel with and 2-3 cm caudal to the left clavicle, in the area of an old scar. An older scar was seen closer to the left clavicle. Using minimal electrocautery and mostly sharp and blunt dissection the prepectoral pocket was opened carefully to avoid injury to the loops of chronic leads. Extensive  dissection was not necessary. The device was explanted. The pocket was carefully inspected for hemostasis and flushed with copious amounts of antibiotic solution.  The leads were disconnected from the old generator and testing of the lead parameters later showed excellent values. The new generator was connected to the chronic leads, with appropriate pacing noted.   The entire system was then carefully inserted in the pocket with care been taking that the leads and device assumed a comfortable position without pressure on the incision. Great care was taken that the leads be located deep to the generator. The pocket was then closed in layers using 2 layers of 2-0 Vicryl and cutaneous staples after which a sterile dressing was applied.   At the end of the procedure the following lead parameters were encountered:   Right atrial lead sensed P waves 4.0 mV, impedance 437 ohms, threshold 0.5 at 0.4 ms pulse width.  Right ventricular lead sensed R waves  >20 mV, impedance 760 ohms, threshold 0.5 at 0.4 ms pulse width.   Sanda Klein, MD, [facility] [phone] office [phone] pager

## 2018-01-25 ENCOUNTER — Encounter (HOSPITAL_COMMUNITY): Payer: Self-pay | Admitting: Cardiovascular Disease

## 2018-01-25 MED FILL — Heparin Sodium (Porcine) 2 Unit/ML in Sodium Chloride 0.9%: INTRAMUSCULAR | Qty: 500 | Status: AC

## 2018-01-27 ENCOUNTER — Telehealth: Payer: Self-pay | Admitting: Cardiology

## 2018-01-27 NOTE — Telephone Encounter (Signed)
Pt's daughter called in to discuss her mother's recent generator change out. The patient had felt "wiped out" after the procedure but no significant symptoms. At around 1:30 am the patient noted a few seconds of fast heart beat. She had no associated chest discomfort, shortness of breath, lightheadedness.  She has been feeling well since getting up this morning.  Upon review of patient's record she has been known to have nonsustained V. tach noted on her pacemaker.  I discussed this with the daughter as her mother may have had a brief run of NSVT.  We discussed alarm symptoms to report including sustained rapid heartbeat, chest discomfort, shortness of breath, lightheadedness, syncope.  She understands to proceed to the emergency department for severe symptoms.  I told her that I would pass this information on to Dr. Caryl Comes. The patient's daughter also is wondering about the home monitoring unit.  She says when she pushes the button the lights, but she wants to make sure that it is working correctly.  I will send this to the device clinic so that they can call her and review her home device use.

## 2018-01-29 NOTE — Telephone Encounter (Signed)
Her BP had been 110s/70s before the procedure and was 138/42 when I saw he in clinic. So I am surprised about these recent readings, but it is OK to increase to metoprolol succinate whole 25 mg tab daily. Pleased cut back to the prevous dose if she experiences dizziness upon standing. MCr

## 2018-01-29 NOTE — Telephone Encounter (Signed)
Assisted Juliann Pulse (daughter, DPR) with a manual transmission- successful. Normal device function, no episodes, rate histograms favorable.  Juliann Pulse reports that BP readings for Ms. Ladley for 3 days have been 170s/70s. She is only taking a 1/2 tab of 25mg  Toprol-XL at night and wonders if this should be adjusted. I advised them that I should have Dr. Loletha Grayer make that recommendation.  Routed to Dr. Sallyanne Kuster for advisement.

## 2018-01-30 ENCOUNTER — Telehealth: Payer: Self-pay | Admitting: Cardiovascular Disease

## 2018-01-30 NOTE — Telephone Encounter (Signed)
Spoke with pt and pts daughter had them send in a remote transmission, transmission normal no episodes, informed pt and pts daughter that it could be the automatic testing that is performed encouraged pt an pts daughter to keep a log of times that she feels her heart racing as well as a count the beats or if they had a pulse oximeter to record the rate, and to have those at wound check.

## 2018-01-30 NOTE — Telephone Encounter (Signed)
Will forward to the device clinic.

## 2018-01-30 NOTE — Telephone Encounter (Signed)
Amy [name] (Daughter) is calling because [name] had a battery change and now her is heart is pounding hard and racing . It is waking her up out of her sleep . Please call

## 2018-01-31 ENCOUNTER — Ambulatory Visit: Payer: Medicare Other

## 2018-02-12 DIAGNOSIS — R002 Palpitations: Secondary | ICD-10-CM | POA: Diagnosis not present

## 2018-02-12 DIAGNOSIS — R001 Bradycardia, unspecified: Secondary | ICD-10-CM | POA: Diagnosis not present

## 2018-02-12 DIAGNOSIS — Z6828 Body mass index (BMI) 28.0-28.9, adult: Secondary | ICD-10-CM | POA: Diagnosis not present

## 2018-02-12 DIAGNOSIS — R42 Dizziness and giddiness: Secondary | ICD-10-CM | POA: Diagnosis not present

## 2018-02-12 DIAGNOSIS — I10 Essential (primary) hypertension: Secondary | ICD-10-CM | POA: Diagnosis not present

## 2018-02-12 DIAGNOSIS — M6281 Muscle weakness (generalized): Secondary | ICD-10-CM | POA: Diagnosis not present

## 2018-02-12 DIAGNOSIS — R600 Localized edema: Secondary | ICD-10-CM | POA: Diagnosis not present

## 2018-02-12 DIAGNOSIS — J449 Chronic obstructive pulmonary disease, unspecified: Secondary | ICD-10-CM | POA: Diagnosis not present

## 2018-02-12 DIAGNOSIS — Z950 Presence of cardiac pacemaker: Secondary | ICD-10-CM | POA: Diagnosis not present

## 2018-02-12 DIAGNOSIS — J069 Acute upper respiratory infection, unspecified: Secondary | ICD-10-CM | POA: Diagnosis not present

## 2018-02-12 DIAGNOSIS — I5042 Chronic combined systolic (congestive) and diastolic (congestive) heart failure: Secondary | ICD-10-CM | POA: Diagnosis not present

## 2018-02-12 DIAGNOSIS — F5105 Insomnia due to other mental disorder: Secondary | ICD-10-CM | POA: Diagnosis not present

## 2018-02-12 DIAGNOSIS — E039 Hypothyroidism, unspecified: Secondary | ICD-10-CM | POA: Diagnosis not present

## 2018-02-12 DIAGNOSIS — K5900 Constipation, unspecified: Secondary | ICD-10-CM | POA: Diagnosis not present

## 2018-02-12 DIAGNOSIS — R05 Cough: Secondary | ICD-10-CM | POA: Diagnosis not present

## 2018-02-12 DIAGNOSIS — R944 Abnormal results of kidney function studies: Secondary | ICD-10-CM | POA: Diagnosis not present

## 2018-02-14 ENCOUNTER — Ambulatory Visit (INDEPENDENT_AMBULATORY_CARE_PROVIDER_SITE_OTHER): Payer: Medicare Other | Admitting: *Deleted

## 2018-02-14 DIAGNOSIS — I495 Sick sinus syndrome: Secondary | ICD-10-CM | POA: Diagnosis not present

## 2018-02-14 DIAGNOSIS — I480 Paroxysmal atrial fibrillation: Secondary | ICD-10-CM | POA: Diagnosis not present

## 2018-02-14 DIAGNOSIS — Z950 Presence of cardiac pacemaker: Secondary | ICD-10-CM | POA: Diagnosis not present

## 2018-02-14 LAB — CUP PACEART INCLINIC DEVICE CHECK
Battery Remaining Longevity: 177 mo
Battery Voltage: 3.21 V
Brady Statistic AS VS Percent: 78.19 %
Brady Statistic RA Percent Paced: 21.52 %
Implantable Lead Implant Date: 20100309
Implantable Lead Location: 753860
Implantable Pulse Generator Implant Date: 20190327
Lead Channel Impedance Value: 323 Ohm
Lead Channel Pacing Threshold Amplitude: 0.5 V
Lead Channel Pacing Threshold Pulse Width: 0.4 ms
Lead Channel Pacing Threshold Pulse Width: 0.4 ms
Lead Channel Sensing Intrinsic Amplitude: 18.125 mV
Lead Channel Sensing Intrinsic Amplitude: 3.125 mV
Lead Channel Setting Pacing Amplitude: 2.5 V
MDC IDC LEAD IMPLANT DT: 20100309
MDC IDC LEAD LOCATION: 753859
MDC IDC MSMT LEADCHNL RA IMPEDANCE VALUE: 418 Ohm
MDC IDC MSMT LEADCHNL RV IMPEDANCE VALUE: 741 Ohm
MDC IDC MSMT LEADCHNL RV IMPEDANCE VALUE: 798 Ohm
MDC IDC MSMT LEADCHNL RV PACING THRESHOLD AMPLITUDE: 0.5 V
MDC IDC SESS DTM: 20190417145728
MDC IDC SET LEADCHNL RA PACING AMPLITUDE: 2 V
MDC IDC SET LEADCHNL RV PACING PULSEWIDTH: 0.4 ms
MDC IDC SET LEADCHNL RV SENSING SENSITIVITY: 1.2 mV
MDC IDC STAT BRADY AP VP PERCENT: 0.03 %
MDC IDC STAT BRADY AP VS PERCENT: 21.7 %
MDC IDC STAT BRADY AS VP PERCENT: 0.09 %
MDC IDC STAT BRADY RV PERCENT PACED: 0.2 %

## 2018-02-14 MED ORDER — METOPROLOL SUCCINATE ER 25 MG PO TB24: 25.0000 mg | ORAL_TABLET | Freq: Every day | ORAL | 3 refills | Status: DC

## 2018-02-14 NOTE — Progress Notes (Signed)
Wound check appointment. Staples removed. Wound without redness or edema. Incision edges approximated, wound well healed. Normal device function. Thresholds, sensing, and impedances consistent with implant measurements. Device programmed at chronic outputs. Histogram distribution appropriate for patient, but more robust than at previous checks (Ap now 21.5% vs 83.7% in 05/2017). 5 AT/AF episodes (1.4% burden), all occurred on 02/01/18, no OAC due to falls. No high ventricular rates noted. Patient educated about wound care, arm mobility, lifting restrictions, and Carelink monitor. ROV with [facility] on 04/25/18.  Patient reports ongoing episodes of palpitations that wake her up at night, no associated chest discomfort or ShOB, HR in the 70s-90s during these episodes. BP has also been running 615H-834P systolic (recent BPs 735/78, 124/59, 155/69). Per phone note from 01/27/18, Dr. Sallyanne Kuster recommended increasing metoprolol succinate to 25mg  daily for these symptoms. Patient and daughter had not yet been made aware of these recommendations, but are agreeable to this change. Patient aware to decrease dose back to a half-tablet daily if she experiences dizziness upon standing. Med list updated and new prescription sent electronically to patient's preferred pharmacy. Patient and daughter are appreciative of assistance and aware to call with any questions or concerns.

## 2018-02-14 NOTE — Patient Instructions (Signed)
Medication Instructions:  - Increase your Toprol-XL to 25mg  (1 tablet) daily.  Decrease dose back to 12.5mg  (half-tablet) daily if you experience dizziness upon standing.  Labwork: none  Testing/Procedures: none  Follow-Up: Follow-up with Dr. Sallyanne Kuster on 04/25/18 at the [facility] office.   If you need a refill on your cardiac medications before your next appointment, please call your pharmacy.

## 2018-02-15 NOTE — Progress Notes (Signed)
Thanks MCr 

## 2018-02-16 ENCOUNTER — Telehealth: Payer: Self-pay | Admitting: Cardiovascular Disease

## 2018-02-16 NOTE — Telephone Encounter (Signed)
New Message  Pt's daughter is calling to check what her mother's uric acid count is and if its high she would like to know if she can get a prescription. Please call

## 2018-02-16 NOTE — Telephone Encounter (Signed)
Spoke with pt dtr, aware we have not checked a uric acid level.

## 2018-02-17 ENCOUNTER — Encounter (HOSPITAL_COMMUNITY): Payer: Self-pay | Admitting: Emergency Medicine

## 2018-02-17 ENCOUNTER — Other Ambulatory Visit: Payer: Self-pay

## 2018-02-17 ENCOUNTER — Emergency Department (HOSPITAL_COMMUNITY)
Admission: EM | Admit: 2018-02-17 | Discharge: 2018-02-17 | Disposition: A | Discharge status: Home / Self Care | Payer: Medicare Other | Attending: Emergency Medicine | Admitting: Emergency Medicine

## 2018-02-17 DIAGNOSIS — I2510 Atherosclerotic heart disease of native coronary artery without angina pectoris: Secondary | ICD-10-CM | POA: Diagnosis not present

## 2018-02-17 DIAGNOSIS — N183 Chronic kidney disease, stage 3 (moderate): Secondary | ICD-10-CM | POA: Insufficient documentation

## 2018-02-17 DIAGNOSIS — Z950 Presence of cardiac pacemaker: Secondary | ICD-10-CM | POA: Insufficient documentation

## 2018-02-17 DIAGNOSIS — E039 Hypothyroidism, unspecified: Secondary | ICD-10-CM | POA: Diagnosis not present

## 2018-02-17 DIAGNOSIS — Z7982 Long term (current) use of aspirin: Secondary | ICD-10-CM | POA: Diagnosis not present

## 2018-02-17 DIAGNOSIS — I129 Hypertensive chronic kidney disease with stage 1 through stage 4 chronic kidney disease, or unspecified chronic kidney disease: Secondary | ICD-10-CM | POA: Insufficient documentation

## 2018-02-17 DIAGNOSIS — I480 Paroxysmal atrial fibrillation: Secondary | ICD-10-CM | POA: Insufficient documentation

## 2018-02-17 DIAGNOSIS — J45909 Unspecified asthma, uncomplicated: Secondary | ICD-10-CM | POA: Insufficient documentation

## 2018-02-17 DIAGNOSIS — E7800 Pure hypercholesterolemia, unspecified: Secondary | ICD-10-CM | POA: Diagnosis not present

## 2018-02-17 DIAGNOSIS — Z86718 Personal history of other venous thrombosis and embolism: Secondary | ICD-10-CM | POA: Diagnosis not present

## 2018-02-17 DIAGNOSIS — Z79899 Other long term (current) drug therapy: Secondary | ICD-10-CM | POA: Diagnosis not present

## 2018-02-17 DIAGNOSIS — M79675 Pain in left toe(s): Secondary | ICD-10-CM | POA: Diagnosis not present

## 2018-02-17 DIAGNOSIS — I252 Old myocardial infarction: Secondary | ICD-10-CM | POA: Insufficient documentation

## 2018-02-17 MED ORDER — METHYLPREDNISOLONE 4 MG PO TBPK: ORAL_TABLET | ORAL | 0 refills | Status: DC

## 2018-02-17 NOTE — Discharge Instructions (Addendum)
I think infection or ischemia is less likely. Potentially could be a gout or pseudogout. Watch for fevers.  Hopefully steroids will help with the pain.  Follow-up with Dr. [place] Crane.

## 2018-02-17 NOTE — ED Provider Notes (Signed)
[facility] EMERGENCY DEPARTMENT Provider Note   CSN: [id number] Arrival date & time: 02/17/18  1409     History   Chief Complaint Chief Complaint  Patient presents with  . Foot Pain    HPI Amy [name] is a 84 y.o. female.  HPI Patient presents with pain in her toes.  Has had for the last month.  Saw her primary, Dr. Nevada Crane.  Did reported test for gout and x-rays.  Has had gout in the past but also states she is been told she has not had gout.  Pain got better after a steroid treatment but the redness remained.  Pain has not returned.  No fevers.  No drainage.  Went from left great toe to now second and third toe also.  Some swelling in the foot.  Recently had pacemaker changed also.  No trauma to the toes. Past Medical History:  Diagnosis Date  . Anxiety   . Arthritis   . Asthma   . CAD in native artery    s/p CABG x 3; Last Myoview in 07/2009 - non-ischemic; Echo 03/2012  Aortic Sclerosis with normal EF.  . CKD (chronic kidney disease), stage III ([place])   . DVT (deep vein thrombosis) in pregnancy ([place])   . Esophageal perforation    9/13  . Fall 07/21/2014   FALL WITH INJURY  . Fracture of head of humerus   . Fracture of hip ([place]) 07/21/2014   LEFT  . GERD (gastroesophageal reflux disease)   . Hypercholesteremia   . Hypertension   . Hypothyroidism   . Mediastinitis    s/p drainage  . Myocardial infarction [facility])    Inferior STEMI 07/2008 - PCI of prox RCA followed byu CABG x 3 in 9/'09  . Pacemaker   . PAF (paroxysmal atrial fibrillation) ([place])   . Paroxysmal atrial flutter ([place]) 11/26/2014  . Pneumonia   . Shortness of breath   . SSS (sick sinus syndrome) ([place]) 11/26/2014  . Status post hip hemiarthroplasty left hip by Dr Ronnie Derby 08/08/2012    Patient Active Problem List   Diagnosis Date Noted  . Pacemaker battery depletion   . Acute on chronic diastolic heart failure ([place]) 06/21/2017  . Generalized weakness 05/13/2017  . Chest pain 05/09/2015  . SSS (sick sinus  syndrome) ([place]) 11/26/2014  . Paroxysmal atrial flutter ([place]) 11/26/2014  . Abnormal results of liver function studies 10/01/2014  . Anxiety state 08/17/2014  . Cough 08/17/2014  . Fracture of humeral head, left, closed 07/22/2014  . Multiple pelvic fractures 07/22/2014  . Fall 07/21/2014  . GERD (gastroesophageal reflux disease) 06/09/2014  . Unstable angina ([place]) 04/15/2014  . C. difficile colitis 12/11/2012  . Abdominal pain, acute 12/06/2012  . Sigmoid diverticulitis 12/06/2012  . UTI (lower urinary tract infection) 12/06/2012  . S/P gastrostomy tube (G tube) placement, follow-up exam 10/17/2012  . Acute respiratory failure with hypoxia ([place]) 08/14/2012  . Pulmonary edema, noncardiac 08/14/2012  . Acute blood loss anemia 08/12/2012  . Mediastinitis due to esophageal perforation-s/p Incision and drainage, right neck and mediastinum, transcervical approach. Cervical esophagoscopy 08/11/2012  . S/P right total hip arthroplasty by Dr Noemi Chapel in 2009 08/11/2012  . Odynophagia due to new traumatic peri-op esophageal perforation 08/09/2012  . Pre-operative clearance 08/08/2012  . Fracture of left hip after fall at home. 08/08/2012  . Aortic valve sclerosis, no stenosis 08/08/2012  . Carotid bruit present bilat, dopplers OK 6/13 08/08/2012  . History of UTI- recent completion 10 days Cipro 08/08/2012  .  Hx of dizziness 08/08/2012  . CKD (chronic kidney disease), stage III ([place]) 08/08/2012  . PAF/SSS 08/08/2012  . Pacemaker March 2010 (MDT) 08/08/2012  . DVT after previous hip surgery 08/08/2012  . Status post hip hemiarthroplasty left hip by Dr Ronnie Derby 08/08/2012 08/08/2012  . Closed fracture of left distal radius s/p ORIF by Dr Burney Gauze 08/08/2012 08/08/2012  . Radial head fracture, closed, Lt 08/07/2012  . Hypertension 08/07/2012  . Hyperlipidemia, statin intol. 08/07/2012  . CAD, CABG X 3 10/09. Myoview low risk 10/10. EF >55% 2D 6/13. 08/07/2012  . DYSPNEA 07/28/2009    Past  Surgical History:  Procedure Laterality Date  . CARPAL TUNNEL RELEASE  08/08/2012   Procedure: CARPAL TUNNEL RELEASE;  Surgeon: Schuyler Amor, MD;  Location: [place];  Service: Orthopedics;  Laterality: Left;  . CORONARY ARTERY BYPASS GRAFT  2009   LIMA-LAD, SVG-RI, SVG-stented RCA  . ESOPHAGOGASTRODUODENOSCOPY  08/10/2012   Procedure: ESOPHAGOGASTRODUODENOSCOPY (EGD);  Surgeon: Beryle Beams, MD;  Location: [facility] ENDOSCOPY;  Service: Endoscopy;  Laterality: N/A;  . ESOPHAGOSCOPY  08/10/2012   Procedure: ESOPHAGOSCOPY;  Surgeon: [name], MD;  Location: [place];  Service: ENT;  Laterality: N/A;  . GASTROSTOMY  08/16/2012   Procedure: GASTROSTOMY;  Surgeon: Zenovia Jarred, MD;  Location: [place];  Service: General;  Laterality: N/A;  . HIP ARTHROPLASTY  08/08/2012   Procedure: ARTHROPLASTY BIPOLAR HIP;  Surgeon: Rudean Haskell, MD;  Location: [place];  Service: Orthopedics;  Laterality: Left;  [place]   . JOINT REPLACEMENT    . LEFT HEART CATHETERIZATION WITH CORONARY ANGIOGRAM N/A 04/16/2014   Procedure: LEFT HEART CATHETERIZATION WITH CORONARY ANGIOGRAM;  Surgeon: Wellington Hampshire, MD;  Location: [place] CATH LAB;  Service: Cardiovascular;  Laterality: N/A;  . NM MYOCAR PERF WALL MOTION  08/14/2009   Normal  . PACEMAKER INSERTION  01/06/2009   [employer]  . PPM GENERATOR CHANGEOUT N/A 01/24/2018   Procedure: PPM GENERATOR CHANGEOUT;  Surgeon: Sanda Klein, MD;  Location: [place] CV LAB;  Service: Cardiovascular;  Laterality: N/A;  . TOTAL HIP ARTHROPLASTY    . WRIST FRACTURE SURGERY  07/2012   left intra articular  with carpel tunnel     OB History   None      Home Medications    Prior to Admission medications   Medication Sig Start Date End Date Taking? Authorizing Provider  acetaminophen (TYLENOL) 500 MG tablet Take 1,000 mg by mouth every 6 (six) hours as needed for moderate pain.    [provider]  albuterol (PROVENTIL HFA;VENTOLIN HFA) 108 (90 Base) MCG/ACT  inhaler Inhale 2 puffs into the lungs every 6 (six) hours as needed for wheezing or shortness of breath.     [provider]  aspirin EC 81 MG tablet Take 81 mg by mouth daily.    [provider]  Cholecalciferol (VITAMIN D3 PO) Take 1 tablet by mouth daily.    [provider]  cycloSPORINE (RESTASIS) 0.05 % ophthalmic emulsion Place 1 drop into both eyes 2 (two) times daily.    [provider]  docusate sodium (COLACE) 100 MG capsule Take 100 mg by mouth 2 (two) times daily.    [provider]  Fluticasone-Umeclidin-Vilant (TRELEGY ELLIPTA) 100-62.5-25 MCG/INH AEPB Inhale 1 puff into the lungs daily.     [provider]  furosemide (LASIX) 40 MG tablet Take 1 tablet (40 mg total) by mouth daily as directed. Patient taking differently: Take 40 mg by mouth daily.  06/21/17  Croitoru, Mihai, MD  Lactobacillus-Inulin (CULTURELLE DIGESTIVE HEALTH PO) Take 1 capsule by mouth daily.     [provider]  levothyroxine (SYNTHROID, LEVOTHROID) 50 MCG tablet Take 50 mcg by mouth daily before breakfast.  06/20/16   [provider]  LORazepam (ATIVAN) 1 MG tablet Take 1 mg by mouth at bedtime as needed for anxiety or sleep.     [provider]  methylPREDNISolone (MEDROL DOSEPAK) 4 MG TBPK tablet Use per pack directions 02/17/18   Davonna Belling, MD  metoprolol succinate (TOPROL XL) 25 MG 24 hr tablet Take 1 tablet (25 mg total) by mouth daily. 02/14/18   Croitoru, Mihai, MD  nitroGLYCERIN (NITROSTAT) 0.4 MG SL tablet Place 1 tablet (0.4 mg total) under the tongue every 5 (five) minutes x 3 doses as needed for chest pain. Patient not taking: Reported on 02/14/2018 04/17/14   Barrett, Evelene Croon, PA-C  omeprazole (PRILOSEC) 40 MG capsule TAKE ONE CAPSULE BY MOUTH ONCE DAILY Patient taking differently: TAKE 40 MG BY MOUTH ONCE DAILY 07/25/17   Setzer, Rona Ravens, NP  potassium chloride (K-DUR) 10 MEQ tablet Take 1 tablet (10 mEq total) by  mouth daily as directed. Patient taking differently: Take 10 mEq by mouth daily.  12/26/17   Croitoru, Mihai, MD  ranitidine (ZANTAC) 150 MG tablet TAKE ONE TABLET BY MOUTH ONCE DAILY AT BEDTIME Patient taking differently: TAKE 150 MG BY MOUTH ONCE DAILY AT BEDTIME 08/21/17   Setzer, Rona Ravens, NP    Family History Family History  Problem Relation Age of Onset  . Stroke Father     Social History Social History   Tobacco Use  . Smoking status: Never Smoker  . Smokeless tobacco: Never Used  Substance Use Topics  . Alcohol use: No  . Drug use: No     Allergies   Colestipol; Hydrocodone-acetaminophen; Niacin and related; Statins; and Zetia [ezetimibe]   Review of Systems Review of Systems  Constitutional: Negative for appetite change, chills and fever.  HENT: Negative for congestion.   Cardiovascular: Negative for chest pain.  Musculoskeletal: Negative for back pain.       Left toe pain  Skin: Positive for color change. Negative for wound.     Physical Exam Updated Vital Signs BP (!) 158/98 (BP Location: Left Arm)   Pulse 92   Temp 98.5 F (36.9 C) (Temporal)   Resp 20   Ht 5\' 5"  (1.651 m)   Wt 78.9 kg (174 lb)   SpO2 97%   BMI 28.96 kg/m   Physical Exam  Constitutional: She appears well-developed.  HENT:  Head: Atraumatic.  Cardiovascular: Normal rate.  Musculoskeletal: She exhibits tenderness.  Tenderness over left first second and third toes.  Some purplish discoloration.  Somewhat delayed capillary refill.  No warmth.  Edema of the foot without erythema.  Does have dorsalis pedis pulse.  Neurological: She is alert.  Skin: Skin is warm. Capillary refill takes less than 2 seconds.     ED Treatments / Results  Labs (all labs ordered are listed, but only abnormal results are displayed) Labs Reviewed - No data to display  EKG None  Radiology No results found.  Procedures Procedures (including critical care time)  Medications Ordered in  ED Medications - No data to display   Initial Impression / Assessment and Plan / ED Course  I have reviewed the triage vital signs and the nursing notes.  Pertinent labs & imaging results that were available during my care of the patient were  reviewed by me and considered in my medical decision making (see chart for details).     Patient with pain and toes on left foot.  History of gout.  I think infection is felt less likely.  Reviewed x-ray done a month ago.  No new trauma.  Will try steroids again.  I think gout versus pseudogout potential.  Will return for fevers.  Discharge home.  Final Clinical Impressions(s) / ED Diagnoses   Final diagnoses:  Pain of toe of left foot    ED Discharge Orders        Ordered    methylPREDNISolone (MEDROL DOSEPAK) 4 MG TBPK tablet     02/17/18 1459       Davonna Belling, MD 02/17/18 1519

## 2018-02-17 NOTE — ED Triage Notes (Signed)
Patient complains of left foot pain and swelling since march 18th. She states she went to see Dr. Nevada Crane about the pain. He did tests for gout and xrays, but she states she is unable to get any information about results from his office. Patient presents with her daughter stating that she is worried about infection.

## 2018-02-19 NOTE — Telephone Encounter (Signed)
Patient started increased metoprolol on 02/14/18.   Blood pressures/heart rates have been as follows: 4/17 - 145/69 - 69 4/18 - 152-71 - 69 4/19 - 117/51 - 69 4/20 - 145/63 - 66 4/21 - 144/59 - 87 4/22 - 161/70 - 81  She reports continued weight gain and left foot/ankle swelling, today's weight = 176 lbs. Weight at generator changout 174 lbs. Patient's PCP performed uric acid level, and she went to ER. ER MD started her on methylprednisolone.  Any new recommendations?

## 2018-02-19 NOTE — Telephone Encounter (Signed)
Shelly-  I'm covering for Dr. Loletha Grayer this week. If she is on steroids now for what may be gout or inflammatory arthritis, that may also be raising her blood pressure. I would continue her current doses of BP meds and check with Dr. Loletha Grayer when he returns.  Dr. Debara Pickett

## 2018-02-26 ENCOUNTER — Other Ambulatory Visit: Payer: Self-pay | Admitting: Cardiovascular Disease

## 2018-02-26 NOTE — Telephone Encounter (Signed)
REFILL 

## 2018-02-27 DIAGNOSIS — L570 Actinic keratosis: Secondary | ICD-10-CM | POA: Diagnosis not present

## 2018-03-02 ENCOUNTER — Telehealth: Payer: Self-pay | Admitting: Cardiovascular Disease

## 2018-03-02 DIAGNOSIS — E039 Hypothyroidism, unspecified: Secondary | ICD-10-CM | POA: Diagnosis not present

## 2018-03-02 DIAGNOSIS — R944 Abnormal results of kidney function studies: Secondary | ICD-10-CM | POA: Diagnosis not present

## 2018-03-02 DIAGNOSIS — Z950 Presence of cardiac pacemaker: Secondary | ICD-10-CM | POA: Diagnosis not present

## 2018-03-02 DIAGNOSIS — I10 Essential (primary) hypertension: Secondary | ICD-10-CM | POA: Diagnosis not present

## 2018-03-02 DIAGNOSIS — R002 Palpitations: Secondary | ICD-10-CM | POA: Diagnosis not present

## 2018-03-02 DIAGNOSIS — Z6828 Body mass index (BMI) 28.0-28.9, adult: Secondary | ICD-10-CM | POA: Diagnosis not present

## 2018-03-02 DIAGNOSIS — M1000 Idiopathic gout, unspecified site: Secondary | ICD-10-CM | POA: Diagnosis not present

## 2018-03-02 NOTE — Telephone Encounter (Addendum)
Returned call to patient's daughter Juliann Pulse. She spoke with Dr. Lurline Del primary last week and provided BP readings and were told they would be reviewed to determine if med changes needed to be made. See phone note 3/30 for reference - BP readings.   Informed her that I would send this message to [facility] for follow up.

## 2018-03-02 NOTE — Telephone Encounter (Signed)
New Message    Patient daughter is calling due to a phone call that she received last week. She said that someone called and said she was a nurse of Dr. Sallyanne Kuster and asked for her mothers BP for the week and advised they would follow up. Please call to discuss.

## 2018-03-18 ENCOUNTER — Other Ambulatory Visit: Payer: Self-pay | Admitting: Cardiovascular Disease

## 2018-03-19 NOTE — Telephone Encounter (Signed)
Rx has been sent to the pharmacy electronically. ° °

## 2018-04-02 ENCOUNTER — Encounter: Payer: Self-pay | Admitting: Cardiovascular Disease

## 2018-04-13 ENCOUNTER — Emergency Department (HOSPITAL_COMMUNITY): Payer: Medicare Other

## 2018-04-13 ENCOUNTER — Emergency Department (HOSPITAL_COMMUNITY)
Admission: EM | Admit: 2018-04-13 | Discharge: 2018-04-13 | Disposition: A | Discharge status: Home / Self Care | Payer: Medicare Other | Attending: Emergency Medicine | Admitting: Emergency Medicine

## 2018-04-13 ENCOUNTER — Encounter (HOSPITAL_COMMUNITY): Payer: Self-pay

## 2018-04-13 ENCOUNTER — Other Ambulatory Visit: Payer: Self-pay

## 2018-04-13 DIAGNOSIS — I2510 Atherosclerotic heart disease of native coronary artery without angina pectoris: Secondary | ICD-10-CM | POA: Diagnosis not present

## 2018-04-13 DIAGNOSIS — Z79899 Other long term (current) drug therapy: Secondary | ICD-10-CM | POA: Diagnosis not present

## 2018-04-13 DIAGNOSIS — Z931 Gastrostomy status: Secondary | ICD-10-CM | POA: Insufficient documentation

## 2018-04-13 DIAGNOSIS — Z7982 Long term (current) use of aspirin: Secondary | ICD-10-CM | POA: Diagnosis not present

## 2018-04-13 DIAGNOSIS — I4891 Unspecified atrial fibrillation: Secondary | ICD-10-CM | POA: Diagnosis not present

## 2018-04-13 DIAGNOSIS — I129 Hypertensive chronic kidney disease with stage 1 through stage 4 chronic kidney disease, or unspecified chronic kidney disease: Secondary | ICD-10-CM | POA: Diagnosis not present

## 2018-04-13 DIAGNOSIS — R197 Diarrhea, unspecified: Secondary | ICD-10-CM | POA: Diagnosis not present

## 2018-04-13 DIAGNOSIS — R402441 Other coma, without documented Glasgow coma scale score, or with partial score reported, in the field [EMT or ambulance]: Secondary | ICD-10-CM | POA: Diagnosis not present

## 2018-04-13 DIAGNOSIS — J45909 Unspecified asthma, uncomplicated: Secondary | ICD-10-CM | POA: Insufficient documentation

## 2018-04-13 DIAGNOSIS — R1084 Generalized abdominal pain: Secondary | ICD-10-CM

## 2018-04-13 DIAGNOSIS — I959 Hypotension, unspecified: Secondary | ICD-10-CM | POA: Diagnosis not present

## 2018-04-13 DIAGNOSIS — Z951 Presence of aortocoronary bypass graft: Secondary | ICD-10-CM | POA: Insufficient documentation

## 2018-04-13 DIAGNOSIS — E039 Hypothyroidism, unspecified: Secondary | ICD-10-CM | POA: Insufficient documentation

## 2018-04-13 DIAGNOSIS — R404 Transient alteration of awareness: Secondary | ICD-10-CM | POA: Diagnosis not present

## 2018-04-13 DIAGNOSIS — Z950 Presence of cardiac pacemaker: Secondary | ICD-10-CM | POA: Diagnosis not present

## 2018-04-13 DIAGNOSIS — R55 Syncope and collapse: Secondary | ICD-10-CM

## 2018-04-13 DIAGNOSIS — K5730 Diverticulosis of large intestine without perforation or abscess without bleeding: Secondary | ICD-10-CM | POA: Diagnosis not present

## 2018-04-13 DIAGNOSIS — N183 Chronic kidney disease, stage 3 (moderate): Secondary | ICD-10-CM | POA: Insufficient documentation

## 2018-04-13 LAB — CBC WITH DIFFERENTIAL/PLATELET
Basophils Absolute: 0.1 10*3/uL (ref 0.0–0.1)
Basophils Relative: 1 %
EOS ABS: 0.3 10*3/uL (ref 0.0–0.7)
Eosinophils Relative: 4 %
HCT: 39.8 % (ref 36.0–46.0)
HEMOGLOBIN: 12.7 g/dL (ref 12.0–15.0)
LYMPHS ABS: 1.9 10*3/uL (ref 0.7–4.0)
Lymphocytes Relative: 26 %
MCH: 29.8 pg (ref 26.0–34.0)
MCHC: 31.9 g/dL (ref 30.0–36.0)
MCV: 93.4 fL (ref 78.0–100.0)
MONOS PCT: 10 %
Monocytes Absolute: 0.7 10*3/uL (ref 0.1–1.0)
NEUTROS PCT: 58 %
Neutro Abs: 4.1 10*3/uL (ref 1.7–7.7)
PLATELETS: 201 10*3/uL (ref 150–400)
RBC: 4.26 MIL/uL (ref 3.87–5.11)
RDW: 14.7 % (ref 11.5–15.5)
WBC: 7.1 10*3/uL (ref 4.0–10.5)

## 2018-04-13 LAB — URINALYSIS, ROUTINE W REFLEX MICROSCOPIC
BILIRUBIN URINE: NEGATIVE
Glucose, UA: NEGATIVE mg/dL
Hgb urine dipstick: NEGATIVE
KETONES UR: NEGATIVE mg/dL
LEUKOCYTES UA: NEGATIVE
NITRITE: NEGATIVE
PH: 7 (ref 5.0–8.0)
Protein, ur: NEGATIVE mg/dL
Specific Gravity, Urine: 1.011 (ref 1.005–1.030)

## 2018-04-13 LAB — COMPREHENSIVE METABOLIC PANEL
ALT: 15 U/L (ref 14–54)
ANION GAP: 13 (ref 5–15)
AST: 31 U/L (ref 15–41)
Albumin: 3.6 g/dL (ref 3.5–5.0)
Alkaline Phosphatase: 86 U/L (ref 38–126)
BUN: 19 mg/dL (ref 6–20)
CHLORIDE: 102 mmol/L (ref 101–111)
CO2: 25 mmol/L (ref 22–32)
Calcium: 10.2 mg/dL (ref 8.9–10.3)
Creatinine, Ser: 1.61 mg/dL — ABNORMAL HIGH (ref 0.44–1.00)
GFR calc non Af Amer: 26 mL/min — ABNORMAL LOW (ref >60)
GFR, EST AFRICAN AMERICAN: 30 mL/min — AB (ref >60)
Glucose, Bld: 105 mg/dL — ABNORMAL HIGH (ref 65–99)
POTASSIUM: 4.2 mmol/L (ref 3.5–5.1)
SODIUM: 140 mmol/L (ref 135–145)
Total Bilirubin: 0.6 mg/dL (ref 0.3–1.2)
Total Protein: 6.7 g/dL (ref 6.5–8.1)

## 2018-04-13 LAB — LIPASE, BLOOD: Lipase: 24 U/L (ref 11–51)

## 2018-04-13 LAB — CBG MONITORING, ED: Glucose-Capillary: 99 mg/dL (ref 65–99)

## 2018-04-13 LAB — TROPONIN I: Troponin I: <0.03 ng/mL (ref <0.03)

## 2018-04-13 MED ORDER — SODIUM CHLORIDE 0.9 % IV BOLUS
500.0000 mL | Freq: Once | INTRAVENOUS | Status: AC
  Administered 2018-04-13: 500 mL via INTRAVENOUS

## 2018-04-13 NOTE — ED Triage Notes (Signed)
Pt in by [facility] after a syncopal episode this am.  Pt states she felt she needed to have a BM, was getting up off of the commode and went back to the bed.  Pt states she sat on the bed and that's the last thing she remembers.  Pt denies falling or injury

## 2018-04-13 NOTE — ED Provider Notes (Signed)
[facility] EMERGENCY DEPARTMENT Provider Note   CSN: [id number] Arrival date & time: 04/13/18  4709     History   Chief Complaint Chief Complaint  Patient presents with  . Loss of Consciousness    HPI Amy [name] is a 84 y.o. female.  Patient presents to the ER after a brief loss of consciousness.  Patient reports that she woke up in the middle the night and noticed that she was having abdominal cramping and pain, felt like she needed to have a bowel movement.  She went into the bathroom and sat on the toilet for some time before she was able to have a bowel movement.  She then went back into her bedroom, laid down in bed and her stomach started gurgling again and she got up to the bedside commode.  She had diarrhea and then briefly passed out.  No fall or injury.  Patient still experiencing abdominal pain and cramping here in the ER at arrival.  She has not had any vomiting.  She has not had a fever.  There is no chest pain or shortness of breath.     Past Medical History:  Diagnosis Date  . Anxiety   . Arthritis   . Asthma   . CAD in native artery    s/p CABG x 3; Last Myoview in 07/2009 - non-ischemic; Echo 03/2012  Aortic Sclerosis with normal EF.  . CKD (chronic kidney disease), stage III ([place])   . DVT (deep vein thrombosis) in pregnancy ([place])   . Esophageal perforation    9/13  . Fall 07/21/2014   FALL WITH INJURY  . Fracture of head of humerus   . Fracture of hip ([place]) 07/21/2014   LEFT  . GERD (gastroesophageal reflux disease)   . Hypercholesteremia   . Hypertension   . Hypothyroidism   . Mediastinitis    s/p drainage  . Myocardial infarction [facility])    Inferior STEMI 07/2008 - PCI of prox RCA followed byu CABG x 3 in 9/'09  . Pacemaker   . PAF (paroxysmal atrial fibrillation) ([place])   . Paroxysmal atrial flutter ([place]) 11/26/2014  . Pneumonia   . Shortness of breath   . SSS (sick sinus syndrome) ([place]) 11/26/2014  . Status post hip hemiarthroplasty left hip by  Dr Ronnie Derby 08/08/2012    Patient Active Problem List   Diagnosis Date Noted  . Pacemaker battery depletion   . Acute on chronic diastolic heart failure ([place]) 06/21/2017  . Generalized weakness 05/13/2017  . Chest pain 05/09/2015  . SSS (sick sinus syndrome) ([place]) 11/26/2014  . Paroxysmal atrial flutter ([place]) 11/26/2014  . Abnormal results of liver function studies 10/01/2014  . Anxiety state 08/17/2014  . Cough 08/17/2014  . Fracture of humeral head, left, closed 07/22/2014  . Multiple pelvic fractures 07/22/2014  . Fall 07/21/2014  . GERD (gastroesophageal reflux disease) 06/09/2014  . Unstable angina ([place]) 04/15/2014  . C. difficile colitis 12/11/2012  . Abdominal pain, acute 12/06/2012  . Sigmoid diverticulitis 12/06/2012  . UTI (lower urinary tract infection) 12/06/2012  . S/P gastrostomy tube (G tube) placement, follow-up exam 10/17/2012  . Acute respiratory failure with hypoxia ([place]) 08/14/2012  . Pulmonary edema, noncardiac 08/14/2012  . Acute blood loss anemia 08/12/2012  . Mediastinitis due to esophageal perforation-s/p Incision and drainage, right neck and mediastinum, transcervical approach. Cervical esophagoscopy 08/11/2012  . S/P right total hip arthroplasty by Dr Noemi Chapel in 2009 08/11/2012  . Odynophagia due to new traumatic peri-op esophageal perforation  08/09/2012  . Pre-operative clearance 08/08/2012  . Fracture of left hip after fall at home. 08/08/2012  . Aortic valve sclerosis, no stenosis 08/08/2012  . Carotid bruit present bilat, dopplers OK 6/13 08/08/2012  . History of UTI- recent completion 10 days Cipro 08/08/2012  . Hx of dizziness 08/08/2012  . CKD (chronic kidney disease), stage III ([place]) 08/08/2012  . PAF/SSS 08/08/2012  . Pacemaker March 2010 (MDT) 08/08/2012  . DVT after previous hip surgery 08/08/2012  . Status post hip hemiarthroplasty left hip by Dr Ronnie Derby 08/08/2012 08/08/2012  . Closed fracture of left distal radius s/p ORIF by Dr Burney Gauze  08/08/2012 08/08/2012  . Radial head fracture, closed, Lt 08/07/2012  . Hypertension 08/07/2012  . Hyperlipidemia, statin intol. 08/07/2012  . CAD, CABG X 3 10/09. Myoview low risk 10/10. EF >55% 2D 6/13. 08/07/2012  . DYSPNEA 07/28/2009    Past Surgical History:  Procedure Laterality Date  . CARPAL TUNNEL RELEASE  08/08/2012   Procedure: CARPAL TUNNEL RELEASE;  Surgeon: Schuyler Amor, MD;  Location: [place];  Service: Orthopedics;  Laterality: Left;  . CORONARY ARTERY BYPASS GRAFT  2009   LIMA-LAD, SVG-RI, SVG-stented RCA  . ESOPHAGOGASTRODUODENOSCOPY  08/10/2012   Procedure: ESOPHAGOGASTRODUODENOSCOPY (EGD);  Surgeon: Beryle Beams, MD;  Location: [facility] ENDOSCOPY;  Service: Endoscopy;  Laterality: N/A;  . ESOPHAGOSCOPY  08/10/2012   Procedure: ESOPHAGOSCOPY;  Surgeon: [name], MD;  Location: [place];  Service: ENT;  Laterality: N/A;  . GASTROSTOMY  08/16/2012   Procedure: GASTROSTOMY;  Surgeon: Zenovia Jarred, MD;  Location: [place];  Service: General;  Laterality: N/A;  . HIP ARTHROPLASTY  08/08/2012   Procedure: ARTHROPLASTY BIPOLAR HIP;  Surgeon: Rudean Haskell, MD;  Location: [place];  Service: Orthopedics;  Laterality: Left;  [place]   . JOINT REPLACEMENT    . LEFT HEART CATHETERIZATION WITH CORONARY ANGIOGRAM N/A 04/16/2014   Procedure: LEFT HEART CATHETERIZATION WITH CORONARY ANGIOGRAM;  Surgeon: Wellington Hampshire, MD;  Location: [place] CATH LAB;  Service: Cardiovascular;  Laterality: N/A;  . NM MYOCAR PERF WALL MOTION  08/14/2009   Normal  . PACEMAKER INSERTION  01/06/2009   [employer]  . PPM GENERATOR CHANGEOUT N/A 01/24/2018   Procedure: PPM GENERATOR CHANGEOUT;  Surgeon: Sanda Klein, MD;  Location: [place] CV LAB;  Service: Cardiovascular;  Laterality: N/A;  . TOTAL HIP ARTHROPLASTY    . WRIST FRACTURE SURGERY  07/2012   left intra articular  with carpel tunnel     OB History   None      Home Medications    Prior to Admission medications   Medication Sig  Start Date End Date Taking? Authorizing Provider  aspirin EC 81 MG tablet Take 81 mg by mouth daily.   Yes [provider]  Cholecalciferol (VITAMIN D3 PO) Take 1 tablet by mouth daily.   Yes [provider]  cycloSPORINE (RESTASIS) 0.05 % ophthalmic emulsion Place 1 drop into both eyes 2 (two) times daily.   Yes [provider]  docusate sodium (COLACE) 100 MG capsule Take 100 mg by mouth 2 (two) times daily.   Yes [provider]  Fluticasone-Umeclidin-Vilant (TRELEGY ELLIPTA) 100-62.5-25 MCG/INH AEPB Inhale 1 puff into the lungs daily.    Yes [provider]  furosemide (LASIX) 40 MG tablet TAKE 1 TABLET BY MOUTH ONCE DAILY AS DIRECTED 02/26/18  Yes Croitoru, Mihai, MD  Lactobacillus-Inulin (CULTURELLE DIGESTIVE HEALTH PO) Take 1 capsule by mouth daily.    Yes [provider]  levothyroxine (SYNTHROID, LEVOTHROID) 50 MCG tablet Take 50 mcg by mouth daily before breakfast.  06/20/16  Yes [provider]  LORazepam (ATIVAN) 1 MG tablet Take 1 mg by mouth at bedtime as needed for anxiety or sleep.    Yes [provider]  methylPREDNISolone (MEDROL DOSEPAK) 4 MG TBPK tablet Use per pack directions 02/17/18  Yes Davonna Belling, MD  metoprolol succinate (TOPROL XL) 25 MG 24 hr tablet Take 1 tablet (25 mg total) by mouth daily. 02/14/18  Yes Croitoru, Mihai, MD  omeprazole (PRILOSEC) 40 MG capsule TAKE ONE CAPSULE BY MOUTH ONCE DAILY Patient taking differently: TAKE 40 MG BY MOUTH ONCE DAILY 07/25/17  Yes Setzer, Terri L, NP  potassium chloride (K-DUR) 10 MEQ tablet Take 1 tablet (10 mEq total) by mouth daily as directed. Patient taking differently: Take 10 mEq by mouth daily.  12/26/17  Yes Croitoru, Mihai, MD  ranitidine (ZANTAC) 150 MG tablet TAKE ONE TABLET BY MOUTH ONCE DAILY AT BEDTIME Patient taking differently: TAKE 150 MG BY MOUTH ONCE DAILY AT BEDTIME 08/21/17  Yes Setzer, Terri L, NP  acetaminophen (TYLENOL) 500 MG tablet  Take 1,000 mg by mouth every 6 (six) hours as needed for moderate pain.    [provider]  albuterol (PROVENTIL HFA;VENTOLIN HFA) 108 (90 Base) MCG/ACT inhaler Inhale 2 puffs into the lungs every 6 (six) hours as needed for wheezing or shortness of breath.     [provider]  metoprolol succinate (TOPROL-XL) 25 MG 24 hr tablet TAKE 1/2 (ONE-HALF) TABLET BY MOUTH ONCE DAILY 03/19/18   Croitoru, Dani Gobble, MD  nitroGLYCERIN (NITROSTAT) 0.4 MG SL tablet Place 1 tablet (0.4 mg total) under the tongue every 5 (five) minutes x 3 doses as needed for chest pain. Patient not taking: Reported on 02/14/2018 04/17/14   Barrett, Evelene Croon, PA-C    Family History Family History  Problem Relation Age of Onset  . Stroke Father     Social History Social History   Tobacco Use  . Smoking status: Never Smoker  . Smokeless tobacco: Never Used  Substance Use Topics  . Alcohol use: No  . Drug use: No     Allergies   Colestipol; Hydrocodone-acetaminophen; Niacin and related; Statins; and Zetia [ezetimibe]   Review of Systems Review of Systems  Gastrointestinal: Positive for abdominal pain and diarrhea.  Neurological: Positive for syncope.  All other systems reviewed and are negative.    Physical Exam Updated Vital Signs BP (!) 142/55   Pulse 80   Temp 97.8 F (36.6 C) (Rectal)   Resp 16   Ht 5' 5.5" (1.664 m)   Wt 78.9 kg (174 lb)   SpO2 96%   BMI 28.51 kg/m   Physical Exam  Constitutional: She is oriented to person, place, and time. She appears well-developed and well-nourished. No distress.  HENT:  Head: Normocephalic and atraumatic.  Right Ear: Hearing normal.  Left Ear: Hearing normal.  Nose: Nose normal.  Mouth/Throat: Oropharynx is clear and moist and mucous membranes are normal.  Eyes: Pupils are equal, round, and reactive to light. Conjunctivae and EOM are normal.  Neck: Normal range of motion. Neck supple.  Cardiovascular: Regular rhythm, S1 normal and S2  normal. Exam reveals no gallop and no friction rub.  No murmur heard. Pulmonary/Chest: Effort normal and breath sounds normal. No respiratory distress. She exhibits no tenderness.  Abdominal: Soft. Normal appearance and bowel sounds are normal. There is no hepatosplenomegaly. There is generalized tenderness (Slight, diffuse and generalized). There  is no rebound, no guarding, no tenderness at McBurney's point and negative Murphy's sign. No hernia.  Musculoskeletal: Normal range of motion.  Neurological: She is alert and oriented to person, place, and time. She has normal strength. No cranial nerve deficit or sensory deficit. Coordination normal. GCS eye subscore is 4. GCS verbal subscore is 5. GCS motor subscore is 6.  Skin: Skin is warm, dry and intact. No rash noted. No cyanosis.  Psychiatric: She has a normal mood and affect. Her speech is normal and behavior is normal. Thought content normal.  Nursing note and vitals reviewed.    ED Treatments / Results  Labs (all labs ordered are listed, but only abnormal results are displayed) Labs Reviewed  URINALYSIS, ROUTINE W REFLEX MICROSCOPIC - Abnormal; Notable for the following components:      Result Value   APPearance HAZY (*)    All other components within normal limits  COMPREHENSIVE METABOLIC PANEL - Abnormal; Notable for the following components:   Glucose, Bld 105 (*)    Creatinine, Ser 1.61 (*)    GFR calc non Af Amer 26 (*)    GFR calc Af Amer 30 (*)    All other components within normal limits  CBC WITH DIFFERENTIAL/PLATELET  LIPASE, BLOOD  TROPONIN I  CBG MONITORING, ED    EKG EKG Interpretation  Date/Time:  Friday April 13 2018 03:29:06 EDT Ventricular Rate:  79 PR Interval:    QRS Duration: 89 QT Interval:  407 QTC Calculation: 464 R Axis:   17 Text Interpretation:  Sinus or ectopic atrial rhythm Multiple ventricular premature complexes Aberrant conduction of SV complex(es) Borderline prolonged PR interval Probable  anteroseptal infarct, old No significant change since last tracing Confirmed by Orpah Greek [phone]) on 04/13/2018 3:39:48 AM   Radiology Ct Abdomen Pelvis Wo Contrast  Result Date: 04/13/2018 CLINICAL DATA:  84 year old with acute abdominal pain. Syncopal episode. EXAM: CT ABDOMEN AND PELVIS WITHOUT CONTRAST TECHNIQUE: Multidetector CT imaging of the abdomen and pelvis was performed following the standard protocol without IV contrast. COMPARISON:  CT 12/11/2012 FINDINGS: Lower chest: Pacemaker wires partially included. Scattered atelectasis. No pleural fluid. Hepatobiliary: Questionable slight capsular nodularity. No discrete focal lesion. Gallbladder physiologically distended, no calcified Boyd. No biliary dilatation. Pancreas: Parenchymal atrophy. No ductal dilatation or inflammation. Spleen: Normal in size without focal abnormality. Adrenals/Urinary Tract: Normal adrenal glands. Bilateral renal parenchymal thinning. Small low-density lesions in both kidneys incompletely characterized without contrast but likely simple cysts. No hydronephrosis. Urinary bladder is nondistended, obscured by streak artifact from bilateral hip arthroplasties. Stomach/Bowel: Sigmoid colonic diverticulosis without diverticulitis, allowing for limitations related streak artifact. No bowel wall thickening, inflammatory change or obstruction. Small hiatal hernia. Stomach is nondistended. Normal appendix. Vascular/Lymphatic: Dense aorta bi-iliac atherosclerosis. No enlarged abdominal or pelvic lymph nodes. Reproductive: The uterus is not visualized, may be surgically absent or obscured by streak artifact. Ovaries tentatively identified and normal. Other: Fat containing umbilical and supraumbilical ventral abdominal wall hernias. No free air, free fluid, or intra-abdominal fluid collection. Musculoskeletal: Bilateral hip arthroplasties. The bones are under mineralized. There are no acute or suspicious osseous abnormalities.  IMPRESSION: 1. No acute findings in the abdomen/pelvis. 2. Colonic diverticulosis without diverticulitis. 3. Questionable hepatic capsular nodularity which can be seen with cirrhosis. 4.  Aortic Atherosclerosis (ICD10-I70.0). 5. Additional chronic and incidental findings as described. Electronically Signed   By: Jeb Levering M.D.   On: 04/13/2018 06:03    Procedures Procedures (including critical care time)  Medications Ordered in ED Medications  sodium chloride 0.9 % bolus 500 mL (0 mLs Intravenous Stopped 04/13/18 0601)     Initial Impression / Assessment and Plan / ED Course  I have reviewed the triage vital signs and the nursing notes.  Pertinent labs & imaging results that were available during my care of the patient were reviewed by me and considered in my medical decision making (see chart for details).     Patient presented to the ER with complaints of abdominal pain, diarrhea and syncope.  She had onset of abdominal pain followed by multiple bowel movements with diarrhea.  During the second diarrhea bowel movements she briefly lost consciousness.  There was no fall or injury.  This sounds like a vasovagal episode secondary to the abdominal pain, cramping and diarrhea.  She does not appear dehydrated.  No fever.  All lab work is normal.  No focal neurologic symptoms, does not require brain imaging.  She did, however, continued to have cramping of the abdomen here in the ER, underwent CT scan.  CT scan is normal.  After IV fluids and monitoring, her pain has now completely resolved and she is back to her normal baseline without complaints.  Will discharge.  Patient does take a lot of laxatives and drink prune juice as she does appear to be somewhat fixated on her bowel movements.  I recommended she not use any of her bowel regimen today and can judiciously use Imodium if needed.  Final Clinical Impressions(s) / ED Diagnoses   Final diagnoses:  Generalized abdominal pain  Diarrhea,  unspecified type  Syncope, unspecified syncope type    ED Discharge Orders    None       Orpah Greek, MD 04/13/18 [phone]

## 2018-04-16 DIAGNOSIS — Z6828 Body mass index (BMI) 28.0-28.9, adult: Secondary | ICD-10-CM | POA: Diagnosis not present

## 2018-04-16 DIAGNOSIS — R300 Dysuria: Secondary | ICD-10-CM | POA: Diagnosis not present

## 2018-04-16 DIAGNOSIS — Z0000 Encounter for general adult medical examination without abnormal findings: Secondary | ICD-10-CM | POA: Diagnosis not present

## 2018-04-16 DIAGNOSIS — M6281 Muscle weakness (generalized): Secondary | ICD-10-CM | POA: Diagnosis not present

## 2018-04-16 DIAGNOSIS — J069 Acute upper respiratory infection, unspecified: Secondary | ICD-10-CM | POA: Diagnosis not present

## 2018-04-16 DIAGNOSIS — F5105 Insomnia due to other mental disorder: Secondary | ICD-10-CM | POA: Diagnosis not present

## 2018-04-16 DIAGNOSIS — Z712 Person consulting for explanation of examination or test findings: Secondary | ICD-10-CM | POA: Diagnosis not present

## 2018-04-16 DIAGNOSIS — R05 Cough: Secondary | ICD-10-CM | POA: Diagnosis not present

## 2018-04-16 DIAGNOSIS — I10 Essential (primary) hypertension: Secondary | ICD-10-CM | POA: Diagnosis not present

## 2018-04-16 DIAGNOSIS — R944 Abnormal results of kidney function studies: Secondary | ICD-10-CM | POA: Diagnosis not present

## 2018-04-16 DIAGNOSIS — J449 Chronic obstructive pulmonary disease, unspecified: Secondary | ICD-10-CM | POA: Diagnosis not present

## 2018-04-16 DIAGNOSIS — R001 Bradycardia, unspecified: Secondary | ICD-10-CM | POA: Diagnosis not present

## 2018-04-16 DIAGNOSIS — I5042 Chronic combined systolic (congestive) and diastolic (congestive) heart failure: Secondary | ICD-10-CM | POA: Diagnosis not present

## 2018-04-16 DIAGNOSIS — K5900 Constipation, unspecified: Secondary | ICD-10-CM | POA: Diagnosis not present

## 2018-04-17 DIAGNOSIS — N390 Urinary tract infection, site not specified: Secondary | ICD-10-CM | POA: Diagnosis not present

## 2018-04-17 DIAGNOSIS — R300 Dysuria: Secondary | ICD-10-CM | POA: Diagnosis not present

## 2018-04-17 DIAGNOSIS — I10 Essential (primary) hypertension: Secondary | ICD-10-CM | POA: Diagnosis not present

## 2018-04-25 ENCOUNTER — Encounter: Payer: Medicare Other | Admitting: Cardiovascular Disease

## 2018-05-07 ENCOUNTER — Ambulatory Visit (INDEPENDENT_AMBULATORY_CARE_PROVIDER_SITE_OTHER): Payer: Medicare Other | Admitting: Cardiovascular Disease

## 2018-05-07 VITALS — BP 156/69 | HR 81 | Ht 65.5 in | Wt 175.6 lb

## 2018-05-07 DIAGNOSIS — I25810 Atherosclerosis of coronary artery bypass graft(s) without angina pectoris: Secondary | ICD-10-CM | POA: Diagnosis not present

## 2018-05-07 DIAGNOSIS — I10 Essential (primary) hypertension: Secondary | ICD-10-CM | POA: Diagnosis not present

## 2018-05-07 DIAGNOSIS — I4892 Unspecified atrial flutter: Secondary | ICD-10-CM | POA: Diagnosis not present

## 2018-05-07 DIAGNOSIS — I5032 Chronic diastolic (congestive) heart failure: Secondary | ICD-10-CM

## 2018-05-07 DIAGNOSIS — I495 Sick sinus syndrome: Secondary | ICD-10-CM | POA: Diagnosis not present

## 2018-05-07 DIAGNOSIS — Z950 Presence of cardiac pacemaker: Secondary | ICD-10-CM

## 2018-05-07 NOTE — Patient Instructions (Signed)
Dr Sallyanne Kuster has recommended making the following medication changes: 1. TAKE Furosemide ONLY AS NEEDED  Remote monitoring is used to monitor your Pacemaker or ICD from home. This monitoring reduces the number of office visits required to check your device to one time per year. It allows [place] to keep an eye on the functioning of your device to ensure it is working properly. You are scheduled for a device check from home on Monday, October 7th, 2019. You may send your transmission at any time that day. If you have a wireless device, the transmission will be sent automatically. After your physician reviews your transmission, you will receive a notification with your next transmission date.  To improve our patient care and to more adequately follow your device, [facility] has decided, as a practice, to start following each patient four times a year with your home monitor. This means that you may experience a remote appointment that is close to an in-office appointment with your physician. Your insurance will apply at the same rate as other remote monitoring transmissions.  Dr Sallyanne Kuster recommends that you schedule a follow-up appointment in 12 months with a pacemaker check. You will receive a reminder letter in the mail two months in advance. If you don't receive a letter, please call our office to schedule the follow-up appointment.  If you need a refill on your cardiac medications before your next appointment, please call your pharmacy.

## 2018-05-07 NOTE — Progress Notes (Signed)
Cardiology Office Note    Date:  05/09/2018   ID:  Amy [name], DOB 08-08-1922, MRN [id number]  PCP:  Celene Squibb, MD  Cardiologist:   Sanda Klein, MD   Chief complaint: Pacemaker check, coronary artery disease, syncope   History of Present Illness:  Amy [name] is a 84 y.o. female with history of coronary artery disease and previous bypass surgery as well as sinus node dysfunction and paroxysmal atrial arrhythmia; she is accompanied by her daughter.  She had a single episode of syncope recently.  She has chronic problems with constipation but after treating it with a variety of interventions she developed severe diarrhea.  While on the commode she developed flushing, sweating and a faint sensation.  She managed to get to the bed and then passed out.  She recovered promptly after lying flat.  Her blood pressure oscillates early broadly between 111/50 and 161/71  She has not had any angina pectoris.  At home her weight has been relatively steady in the range of 170-177 pounds.  She has chronic exertional dyspnea NYHA functional class II-III.  She is actually quite active for the her age.  She has not had any problems with edema.  She denies orthopnea or PND.  She does not complain of palpitations.  She has not had any falls or hospitalizations, but she was seen in the emergency room at any pain on June 14 with abdominal pain.  She is a little I would I would Is getting progressively older and more frail. She remains very mentally alert.  Pacemaker interrogation shows normal device function.  Her current pacemaker is a generator change performed in March 2019 (Medtronic Azure; leads implanted 2010).  Lead parameters are good. She is not pacemaker dependent. She has 65% atrial pacing and only 0.3% ventricular pacing. The burden of atrial arrhythmia has been extremely low - 0.3% atrial fibrillation.  Further episodes of mode switch almost entirely made up by a 6-hour episode of atrial  fibrillation that occurred on April 4.  Otherwise the episodes have been very brief.  Her activity is fairly constant at roughly 1 hour a day..  In 2009 she had an acute inferior wall ST segment elevation myocardial infarction treated with urgent angioplasty and placement of a bare-metal stent. Subsequently, she underwent multivessel bypass surgery with LIMA to LAD, SVG to ramus SVG to RCA. She has no evidence of residual myocardial injury and has preserved left ventricular systolic function. In June 2015, cardiac catheterization showed 90% proximal and mid LAD with patent distal LIMA bypass; 40% ostial circumflex, not bypassed; unchanged 80% proximal ramus intermedius, SVG to ramus occluded; patent stent right coronary artery, SVG to right coronary artery occluded. Her LVEF was 55%, there was no significant mitral regurgitation and the LVEDP was high normal at 15 mm Hg, (when she was short of breath). [name] stated that her dyspnea is likely multifactorial and cannot be explained completely by cardiac findings. In May 2016 she had a low risk nuclear study.  In 2010 a permanent pacemaker (Medtronic) was implanted for symptomatic bradycardia.  She underwent a generator change out in March 2019.  She has paroxysmal atrial fibrillation and atrial flutter, but she is not taking anticoagulants to fall/bleeding risk. She does take aspirin.   In late 2013 she had a fall complicated by a hip fracture and radius fracture. She had an esophageal perforation complicated by mediastinitis and had a feeding tube for several months. In October 2015 she  had another fall in her home (no syncope, tripped at [facility].carpet transition) and fractured her pelvis and humerus.   She has fairly severe hyperlipidemia with an LDL cholesterol in excess of 200. She is statin, niacin, ezetimibe and resin intolerant. She has discussed PSCK9 inhibitors in the past but has decided not to take these.  She has systolic HTN, but  also a tendency to marked orthostatic hypotension (25 mm Hg drop in SBP).  She has moderate CKD, baseline GFR around 30 mL/minute, has seen Dr. Lowanda Foster in the past, but does not have regular nephrology follow-up  Past Medical History:  Diagnosis Date  . Anxiety   . Arthritis   . Asthma   . CAD in native artery    s/p CABG x 3; Last Myoview in 07/2009 - non-ischemic; Echo 03/2012  Aortic Sclerosis with normal EF.  . CKD (chronic kidney disease), stage III ([place])   . DVT (deep vein thrombosis) in pregnancy ([place])   . Esophageal perforation    9/13  . Fall 07/21/2014   FALL WITH INJURY  . Fracture of head of humerus   . Fracture of hip ([place]) 07/21/2014   LEFT  . GERD (gastroesophageal reflux disease)   . Hypercholesteremia   . Hypertension   . Hypothyroidism   . Mediastinitis    s/p drainage  . Myocardial infarction [facility])    Inferior STEMI 07/2008 - PCI of prox RCA followed byu CABG x 3 in 9/'09  . Pacemaker   . PAF (paroxysmal atrial fibrillation) ([place])   . Paroxysmal atrial flutter ([place]) 11/26/2014  . Pneumonia   . Shortness of breath   . SSS (sick sinus syndrome) ([place]) 11/26/2014  . Status post hip hemiarthroplasty left hip by Dr Ronnie Derby 08/08/2012    Past Surgical History:  Procedure Laterality Date  . CARPAL TUNNEL RELEASE  08/08/2012   Procedure: CARPAL TUNNEL RELEASE;  Surgeon: Schuyler Amor, MD;  Location: [place];  Service: Orthopedics;  Laterality: Left;  . CORONARY ARTERY BYPASS GRAFT  2009   LIMA-LAD, SVG-RI, SVG-stented RCA  . ESOPHAGOGASTRODUODENOSCOPY  08/10/2012   Procedure: ESOPHAGOGASTRODUODENOSCOPY (EGD);  Surgeon: Beryle Beams, MD;  Location: [facility] ENDOSCOPY;  Service: Endoscopy;  Laterality: N/A;  . ESOPHAGOSCOPY  08/10/2012   Procedure: ESOPHAGOSCOPY;  Surgeon: [name], MD;  Location: [place];  Service: ENT;  Laterality: N/A;  . GASTROSTOMY  08/16/2012   Procedure: GASTROSTOMY;  Surgeon: Zenovia Jarred, MD;  Location: [place];  Service: General;   Laterality: N/A;  . HIP ARTHROPLASTY  08/08/2012   Procedure: ARTHROPLASTY BIPOLAR HIP;  Surgeon: Rudean Haskell, MD;  Location: [place];  Service: Orthopedics;  Laterality: Left;  [place]   . JOINT REPLACEMENT    . LEFT HEART CATHETERIZATION WITH CORONARY ANGIOGRAM N/A 04/16/2014   Procedure: LEFT HEART CATHETERIZATION WITH CORONARY ANGIOGRAM;  Surgeon: Wellington Hampshire, MD;  Location: [place] CATH LAB;  Service: Cardiovascular;  Laterality: N/A;  . NM MYOCAR PERF WALL MOTION  08/14/2009   Normal  . PACEMAKER INSERTION  01/06/2009   Medtronic  . PPM GENERATOR CHANGEOUT N/A 01/24/2018   Procedure: PPM GENERATOR CHANGEOUT;  Surgeon: Sanda Klein, MD;  Location: [place] CV LAB;  Service: Cardiovascular;  Laterality: N/A;  . TOTAL HIP ARTHROPLASTY    . WRIST FRACTURE SURGERY  07/2012   left intra articular  with carpel tunnel    Current Medications: Outpatient Medications Prior to Visit  Medication Sig Dispense Refill  . acetaminophen (TYLENOL) 500 MG tablet Take 1,000  mg by mouth every 6 (six) hours as needed for moderate pain.    Marland Kitchen albuterol (PROVENTIL HFA;VENTOLIN HFA) 108 (90 Base) MCG/ACT inhaler Inhale 2 puffs into the lungs every 6 (six) hours as needed for wheezing or shortness of breath.     Marland Kitchen aspirin EC 81 MG tablet Take 81 mg by mouth daily.    . Cholecalciferol (VITAMIN D3 PO) Take 1 tablet by mouth daily.    . cycloSPORINE (RESTASIS) 0.05 % ophthalmic emulsion Place 1 drop into both eyes 2 (two) times daily.    Marland Kitchen docusate sodium (COLACE) 100 MG capsule Take 100 mg by mouth 2 (two) times daily.    . Fluticasone-Umeclidin-Vilant (TRELEGY ELLIPTA) 100-62.5-25 MCG/INH AEPB Inhale 1 puff into the lungs daily.     . furosemide (LASIX) 40 MG tablet TAKE 1 TABLET BY MOUTH ONCE DAILY AS DIRECTED 90 tablet 1  . Lactobacillus-Inulin (CULTURELLE DIGESTIVE HEALTH PO) Take 1 capsule by mouth daily.     Marland Kitchen levothyroxine (SYNTHROID, LEVOTHROID) 50 MCG tablet Take 50 mcg by mouth daily before  breakfast.     . LORazepam (ATIVAN) 1 MG tablet Take 1 mg by mouth at bedtime as needed for anxiety or sleep.     . methylPREDNISolone (MEDROL DOSEPAK) 4 MG TBPK tablet Use per pack directions 21 tablet 0  . metoprolol succinate (TOPROL XL) 25 MG 24 hr tablet Take 1 tablet (25 mg total) by mouth daily. 90 tablet 3  . nitroGLYCERIN (NITROSTAT) 0.4 MG SL tablet Place 1 tablet (0.4 mg total) under the tongue every 5 (five) minutes x 3 doses as needed for chest pain. 25 tablet 12  . omeprazole (PRILOSEC) 40 MG capsule TAKE ONE CAPSULE BY MOUTH ONCE DAILY (Patient taking differently: TAKE 40 MG BY MOUTH ONCE DAILY) 90 capsule 3  . potassium chloride (K-DUR) 10 MEQ tablet Take 1 tablet (10 mEq total) by mouth daily as directed. (Patient taking differently: Take 10 mEq by mouth daily. ) 45 tablet 1  . ranitidine (ZANTAC) 150 MG tablet TAKE ONE TABLET BY MOUTH ONCE DAILY AT BEDTIME (Patient taking differently: TAKE 150 MG BY MOUTH ONCE DAILY AT BEDTIME) 90 tablet 3  . metoprolol succinate (TOPROL-XL) 25 MG 24 hr tablet TAKE 1/2 (ONE-HALF) TABLET BY MOUTH ONCE DAILY 45 tablet 1   No facility-administered medications prior to visit.      Allergies:   Colestipol; Hydrocodone-acetaminophen; Niacin and related; Statins; and Zetia [ezetimibe]   Social History   Socioeconomic History  . Marital status: Widowed    Spouse name: Not on file  . Number of children: Not on file  . Years of education: Not on file  . Highest education level: Not on file  Occupational History  . Not on file  Social Needs  . Financial resource strain: Not on file  . Food insecurity:    Worry: Not on file    Inability: Not on file  . Transportation needs:    Medical: Not on file    Non-medical: Not on file  Tobacco Use  . Smoking status: Never Smoker  . Smokeless tobacco: Never Used  Substance and Sexual Activity  . Alcohol use: No  . Drug use: No  . Sexual activity: Never  Lifestyle  . Physical activity:    Days per  week: Not on file    Minutes per session: Not on file  . Stress: Not on file  Relationships  . Social connections:    Talks on phone: Not on file  Gets together: Not on file    Attends religious service: Not on file    Active member of club or organization: Not on file    Attends meetings of clubs or organizations: Not on file    Relationship status: Not on file  Other Topics Concern  . Not on file  Social History Narrative  . Not on file     Family History:  The patient's family history includes Stroke in her father.   ROS:   Please see the history of present illness.    ROS All other systems reviewed and are negative.   PHYSICAL EXAM:   VS:  BP (!) 156/69   Pulse 81   Ht 5' 5.5" (1.664 m)   Wt 175 lb 9.6 oz (79.7 kg)   SpO2 94%   BMI 28.78 kg/m     General: Alert, oriented x3, no distress, overweight, looks elderly but comfortable Head: no evidence of trauma, PERRL, EOMI, no exophtalmos or lid lag, no myxedema, no xanthelasma; normal ears, nose and oropharynx Neck: normal jugular venous pulsations and no hepatojugular reflux; brisk carotid pulses without delay and no carotid bruits Chest: clear to auscultation, no signs of consolidation by percussion or palpation, normal fremitus, symmetrical and full respiratory excursions Cardiovascular: normal position and quality of the apical impulse, regular rhythm, normal first and second heart sounds, no murmurs, rubs or gallops, well-healed left subclavian pacemaker site Abdomen: no tenderness or distention, no masses by palpation, no abnormal pulsatility or arterial bruits, normal bowel sounds, no hepatosplenomegaly Extremities: no clubbing, cyanosis or edema; 2+ radial, ulnar and brachial pulses bilaterally; 2+ right femoral, posterior tibial and dorsalis pedis pulses; 2+ left femoral, posterior tibial and dorsalis pedis pulses; no subclavian or femoral bruits Neurological: grossly nonfocal Psych: Normal mood and affect  Wt  Readings from Last 3 Encounters:  05/07/18 175 lb 9.6 oz (79.7 kg)  04/13/18 174 lb (78.9 kg)  02/17/18 174 lb (78.9 kg)    Studies/Labs Reviewed:   EKG:  EKG is not ordered today.  ECG performed on June 14 is a poor tracing with very severe baseline artifact.  It shows atrial paced, ventricular sensed rhythm with QS pattern in leads V1-V3 but without acute ischemic changes. Lipid Panel    Component Value Date/Time   CHOL 293 (H) 04/16/2014 0422   TRIG 178 (H) 04/16/2014 0422   HDL 47 04/16/2014 0422   CHOLHDL 6.2 04/16/2014 0422   VLDL 36 04/16/2014 0422   LDLCALC 210 (H) 04/16/2014 0422   BMET    Component Value Date/Time   NA 140 04/13/2018 0403   NA 141 01/10/2018 1433   K 4.2 04/13/2018 0403   CL 102 04/13/2018 0403   CO2 25 04/13/2018 0403   GLUCOSE 105 (H) 04/13/2018 0403   BUN 19 04/13/2018 0403   BUN 31 01/10/2018 1433   CREATININE 1.61 (H) 04/13/2018 0403   CREATININE 1.19 (H) 03/05/2015 1157   CALCIUM 10.2 04/13/2018 0403   GFRNONAA 26 (L) 04/13/2018 0403   GFRAA 30 (L) 04/13/2018 0403    ASSESSMENT:    1. Chronic diastolic heart failure ([place])   2. Coronary artery disease involving coronary bypass graft of native heart without angina pectoris   3. SSS (sick sinus syndrome) ([facility])   4. Paroxysmal atrial flutter ([place])   5. Essential hypertension   6. Pacemaker      PLAN:  In order of problems listed above:  1. CHF: She does not appear to be volume overloaded.  In fact I wonder if she may be a little bit on the "dry side".  She has had symptoms of orthostatic hypotension for a long time and she had a recent syncopal event, likely vasovagal.  I think she can take the furosemide only "as needed" when her weight reaches 180 pounds or higher. It may be difficult to maintain a steady balance between renal insufficiency and diastolic heart failure. 2. CAD s/p CABG: Asymptomatic, despite being reasonably active for her age on low-dose aspirin.  Intolerant to  multiple statins and did not want to take PCSK9 inhibitors. 3. SSS: Roughly 65% atrial pacing with appropriate heart rate histograms for her level of activity. 4. AFlutter: Poor anticoagulation candidate due to serious and frequent falls, albeit without any recent falls or injuries. CHADSVAsc 6 (age 76, gender, CHF, hypertension, CAD).  The burden of atrial fibrillation remains extremely low, essentially 6 hours in the last year.  With her age and frailty, anticoagulation remains probably carries higher risk than the arrhythmia itself. 5. HTN: Blood pressure control is satisfactory. "Perfect" blood pressure control should be avoided, since this has led to orthostatic hypotension and falls in the past.  I think we should tolerate blood pressure recordings in the 140s and 150s to avoid falls from orthostatic hypotension. 6. PPM: Normal device function.  Remote downloads every 3 months and yearly office visits.    Medication Adjustments/Labs and Tests Ordered: Current medicines are reviewed at length with the patient today.  Concerns regarding medicines are outlined above.  Medication changes, Labs and Tests ordered today are listed in the Patient Instructions below. Patient Instructions  Dr Sallyanne Kuster has recommended making the following medication changes: 1. TAKE Furosemide ONLY AS NEEDED  Remote monitoring is used to monitor your Pacemaker or ICD from home. This monitoring reduces the number of office visits required to check your device to one time per year. It allows [place] to keep an eye on the functioning of your device to ensure it is working properly. You are scheduled for a device check from home on Monday, October 7th, 2019. You may send your transmission at any time that day. If you have a wireless device, the transmission will be sent automatically. After your physician reviews your transmission, you will receive a notification with your next transmission date.  To improve our patient care and to  more adequately follow your device, [facility] has decided, as a practice, to start following each patient four times a year with your home monitor. This means that you may experience a remote appointment that is close to an in-office appointment with your physician. Your insurance will apply at the same rate as other remote monitoring transmissions.  Dr Sallyanne Kuster recommends that you schedule a follow-up appointment in 12 months with a pacemaker check. You will receive a reminder letter in the mail two months in advance. If you don't receive a letter, please call our office to schedule the follow-up appointment.  If you need a refill on your cardiac medications before your next appointment, please call your pharmacy.    Signed, Sanda Klein, MD  05/09/2018 5:48 PM    [address], [place], [place]  [address] Phone: [phone]; Fax: [phone]

## 2018-05-09 ENCOUNTER — Encounter: Payer: Self-pay | Admitting: Cardiovascular Disease

## 2018-05-13 DIAGNOSIS — J449 Chronic obstructive pulmonary disease, unspecified: Secondary | ICD-10-CM | POA: Diagnosis not present

## 2018-05-13 DIAGNOSIS — R944 Abnormal results of kidney function studies: Secondary | ICD-10-CM | POA: Diagnosis not present

## 2018-05-13 DIAGNOSIS — E039 Hypothyroidism, unspecified: Secondary | ICD-10-CM | POA: Diagnosis not present

## 2018-05-13 DIAGNOSIS — I10 Essential (primary) hypertension: Secondary | ICD-10-CM | POA: Diagnosis not present

## 2018-05-13 DIAGNOSIS — I5042 Chronic combined systolic (congestive) and diastolic (congestive) heart failure: Secondary | ICD-10-CM | POA: Diagnosis not present

## 2018-05-22 ENCOUNTER — Emergency Department (HOSPITAL_COMMUNITY): Payer: Medicare Other

## 2018-05-22 ENCOUNTER — Encounter (HOSPITAL_COMMUNITY): Payer: Self-pay | Admitting: *Deleted

## 2018-05-22 ENCOUNTER — Observation Stay (HOSPITAL_COMMUNITY): Payer: Medicare Other

## 2018-05-22 ENCOUNTER — Inpatient Hospital Stay (HOSPITAL_COMMUNITY)
Admission: EM | Admit: 2018-05-22 | Discharge: 2018-05-24 | DRG: 123 | Disposition: A | Discharge status: Home Health | Payer: Medicare Other | Attending: Internal Medicine | Admitting: Internal Medicine

## 2018-05-22 ENCOUNTER — Observation Stay (HOSPITAL_BASED_OUTPATIENT_CLINIC_OR_DEPARTMENT_OTHER): Payer: Medicare Other

## 2018-05-22 ENCOUNTER — Other Ambulatory Visit: Payer: Self-pay

## 2018-05-22 DIAGNOSIS — H47012 Ischemic optic neuropathy, left eye: Secondary | ICD-10-CM | POA: Diagnosis not present

## 2018-05-22 DIAGNOSIS — I739 Peripheral vascular disease, unspecified: Secondary | ICD-10-CM | POA: Diagnosis present

## 2018-05-22 DIAGNOSIS — N184 Chronic kidney disease, stage 4 (severe): Secondary | ICD-10-CM | POA: Diagnosis not present

## 2018-05-22 DIAGNOSIS — I130 Hypertensive heart and chronic kidney disease with heart failure and stage 1 through stage 4 chronic kidney disease, or unspecified chronic kidney disease: Secondary | ICD-10-CM | POA: Diagnosis not present

## 2018-05-22 DIAGNOSIS — E785 Hyperlipidemia, unspecified: Secondary | ICD-10-CM | POA: Diagnosis present

## 2018-05-22 DIAGNOSIS — I4892 Unspecified atrial flutter: Secondary | ICD-10-CM | POA: Diagnosis not present

## 2018-05-22 DIAGNOSIS — I252 Old myocardial infarction: Secondary | ICD-10-CM

## 2018-05-22 DIAGNOSIS — I480 Paroxysmal atrial fibrillation: Secondary | ICD-10-CM | POA: Diagnosis present

## 2018-05-22 DIAGNOSIS — I5032 Chronic diastolic (congestive) heart failure: Secondary | ICD-10-CM | POA: Diagnosis not present

## 2018-05-22 DIAGNOSIS — I6522 Occlusion and stenosis of left carotid artery: Secondary | ICD-10-CM | POA: Diagnosis present

## 2018-05-22 DIAGNOSIS — K219 Gastro-esophageal reflux disease without esophagitis: Secondary | ICD-10-CM | POA: Diagnosis not present

## 2018-05-22 DIAGNOSIS — Z885 Allergy status to narcotic agent status: Secondary | ICD-10-CM

## 2018-05-22 DIAGNOSIS — H53132 Sudden visual loss, left eye: Secondary | ICD-10-CM

## 2018-05-22 DIAGNOSIS — R29701 NIHSS score 1: Secondary | ICD-10-CM | POA: Diagnosis present

## 2018-05-22 DIAGNOSIS — Z79899 Other long term (current) drug therapy: Secondary | ICD-10-CM

## 2018-05-22 DIAGNOSIS — Z66 Do not resuscitate: Secondary | ICD-10-CM | POA: Diagnosis not present

## 2018-05-22 DIAGNOSIS — N183 Chronic kidney disease, stage 3 unspecified: Secondary | ICD-10-CM | POA: Diagnosis present

## 2018-05-22 DIAGNOSIS — Z79890 Hormone replacement therapy (postmenopausal): Secondary | ICD-10-CM

## 2018-05-22 DIAGNOSIS — I495 Sick sinus syndrome: Secondary | ICD-10-CM | POA: Diagnosis present

## 2018-05-22 DIAGNOSIS — F419 Anxiety disorder, unspecified: Secondary | ICD-10-CM | POA: Diagnosis not present

## 2018-05-22 DIAGNOSIS — Z888 Allergy status to other drugs, medicaments and biological substances status: Secondary | ICD-10-CM

## 2018-05-22 DIAGNOSIS — Z96642 Presence of left artificial hip joint: Secondary | ICD-10-CM | POA: Diagnosis present

## 2018-05-22 DIAGNOSIS — I700 Atherosclerosis of aorta: Secondary | ICD-10-CM | POA: Diagnosis present

## 2018-05-22 DIAGNOSIS — Z9181 History of falling: Secondary | ICD-10-CM

## 2018-05-22 DIAGNOSIS — H547 Unspecified visual loss: Secondary | ICD-10-CM

## 2018-05-22 DIAGNOSIS — I951 Orthostatic hypotension: Secondary | ICD-10-CM | POA: Diagnosis not present

## 2018-05-22 DIAGNOSIS — H3412 Central retinal artery occlusion, left eye: Secondary | ICD-10-CM | POA: Diagnosis not present

## 2018-05-22 DIAGNOSIS — M1990 Unspecified osteoarthritis, unspecified site: Secondary | ICD-10-CM | POA: Diagnosis not present

## 2018-05-22 DIAGNOSIS — Z950 Presence of cardiac pacemaker: Secondary | ICD-10-CM

## 2018-05-22 DIAGNOSIS — I2510 Atherosclerotic heart disease of native coronary artery without angina pectoris: Secondary | ICD-10-CM | POA: Diagnosis present

## 2018-05-22 DIAGNOSIS — E039 Hypothyroidism, unspecified: Secondary | ICD-10-CM | POA: Diagnosis not present

## 2018-05-22 DIAGNOSIS — J449 Chronic obstructive pulmonary disease, unspecified: Secondary | ICD-10-CM | POA: Diagnosis not present

## 2018-05-22 DIAGNOSIS — I361 Nonrheumatic tricuspid (valve) insufficiency: Secondary | ICD-10-CM | POA: Diagnosis not present

## 2018-05-22 DIAGNOSIS — I672 Cerebral atherosclerosis: Secondary | ICD-10-CM | POA: Diagnosis present

## 2018-05-22 DIAGNOSIS — I10 Essential (primary) hypertension: Secondary | ICD-10-CM | POA: Diagnosis present

## 2018-05-22 DIAGNOSIS — H5462 Unqualified visual loss, left eye, normal vision right eye: Secondary | ICD-10-CM | POA: Diagnosis not present

## 2018-05-22 DIAGNOSIS — N1832 Chronic kidney disease, stage 3b: Secondary | ICD-10-CM | POA: Diagnosis present

## 2018-05-22 DIAGNOSIS — M609 Myositis, unspecified: Secondary | ICD-10-CM

## 2018-05-22 DIAGNOSIS — Z951 Presence of aortocoronary bypass graft: Secondary | ICD-10-CM

## 2018-05-22 DIAGNOSIS — Z7982 Long term (current) use of aspirin: Secondary | ICD-10-CM

## 2018-05-22 DIAGNOSIS — H539 Unspecified visual disturbance: Secondary | ICD-10-CM | POA: Diagnosis not present

## 2018-05-22 DIAGNOSIS — I671 Cerebral aneurysm, nonruptured: Secondary | ICD-10-CM | POA: Diagnosis not present

## 2018-05-22 HISTORY — DX: Unspecified visual loss: H54.7

## 2018-05-22 LAB — I-STAT TROPONIN, ED: Troponin i, poc: 0 ng/mL (ref 0.00–0.08)

## 2018-05-22 LAB — URINALYSIS, ROUTINE W REFLEX MICROSCOPIC
BILIRUBIN URINE: NEGATIVE
GLUCOSE, UA: NEGATIVE mg/dL
Hgb urine dipstick: NEGATIVE
KETONES UR: NEGATIVE mg/dL
LEUKOCYTES UA: NEGATIVE
Nitrite: NEGATIVE
Protein, ur: NEGATIVE mg/dL
Specific Gravity, Urine: 1.01 (ref 1.005–1.030)
pH: 7 (ref 5.0–8.0)

## 2018-05-22 LAB — CBC
HCT: 38.7 % (ref 36.0–46.0)
Hemoglobin: 12.9 g/dL (ref 12.0–15.0)
MCH: 30.8 pg (ref 26.0–34.0)
MCHC: 33.3 g/dL (ref 30.0–36.0)
MCV: 92.4 fL (ref 78.0–100.0)
PLATELETS: 218 10*3/uL (ref 150–400)
RBC: 4.19 MIL/uL (ref 3.87–5.11)
RDW: 14.7 % (ref 11.5–15.5)
WBC: 7.5 10*3/uL (ref 4.0–10.5)

## 2018-05-22 LAB — ECHOCARDIOGRAM COMPLETE
HEIGHTINCHES: 65.5 in
Weight: 2800 oz

## 2018-05-22 LAB — DIFFERENTIAL
BASOS ABS: 0 10*3/uL (ref 0.0–0.1)
Basophils Relative: 0 %
Eosinophils Absolute: 0.3 10*3/uL (ref 0.0–0.7)
Eosinophils Relative: 4 %
Lymphocytes Relative: 29 %
Lymphs Abs: 2.2 10*3/uL (ref 0.7–4.0)
MONO ABS: 0.6 10*3/uL (ref 0.1–1.0)
Monocytes Relative: 8 %
NEUTROS ABS: 4.4 10*3/uL (ref 1.7–7.7)
NEUTROS PCT: 59 %

## 2018-05-22 LAB — COMPREHENSIVE METABOLIC PANEL
ALBUMIN: 3.8 g/dL (ref 3.5–5.0)
ALT: 16 U/L (ref 0–44)
AST: 23 U/L (ref 15–41)
Alkaline Phosphatase: 87 U/L (ref 38–126)
Anion gap: 10 (ref 5–15)
BUN: 30 mg/dL — ABNORMAL HIGH (ref 8–23)
CHLORIDE: 98 mmol/L (ref 98–111)
CO2: 29 mmol/L (ref 22–32)
CREATININE: 1.63 mg/dL — AB (ref 0.44–1.00)
Calcium: 9.4 mg/dL (ref 8.9–10.3)
GFR calc non Af Amer: 26 mL/min — ABNORMAL LOW (ref >60)
GFR, EST AFRICAN AMERICAN: 30 mL/min — AB (ref >60)
Glucose, Bld: 109 mg/dL — ABNORMAL HIGH (ref 70–99)
Potassium: 4 mmol/L (ref 3.5–5.1)
SODIUM: 137 mmol/L (ref 135–145)
Total Bilirubin: 0.5 mg/dL (ref 0.3–1.2)
Total Protein: 7.1 g/dL (ref 6.5–8.1)

## 2018-05-22 LAB — C-REACTIVE PROTEIN: CRP: 1.1 mg/dL — AB (ref <1.0)

## 2018-05-22 LAB — APTT: APTT: 26 s (ref 24–36)

## 2018-05-22 LAB — PROTIME-INR
INR: 0.91
PROTHROMBIN TIME: 12.2 s (ref 11.4–15.2)

## 2018-05-22 LAB — SEDIMENTATION RATE: SED RATE: 35 mm/h — AB (ref 0–22)

## 2018-05-22 MED ORDER — SODIUM CHLORIDE 0.9 % IV SOLN: 250.0000 mL | INTRAVENOUS | Status: DC | PRN

## 2018-05-22 MED ORDER — ONDANSETRON HCL 4 MG/2ML IJ SOLN: 4.0000 mg | Freq: Four times a day (QID) | INTRAMUSCULAR | Status: DC | PRN

## 2018-05-22 MED ORDER — VITAMIN D 1000 UNITS PO TABS
1000.0000 [IU] | ORAL_TABLET | Freq: Every day | ORAL | Status: DC
  Administered 2018-05-22 – 2018-05-24 (×3): 1000 [IU] via ORAL
  Filled 2018-05-22 (×6): qty 1

## 2018-05-22 MED ORDER — LEVOTHYROXINE SODIUM 50 MCG PO TABS
50.0000 ug | ORAL_TABLET | Freq: Every day | ORAL | Status: DC
  Administered 2018-05-22 – 2018-05-24 (×3): 50 ug via ORAL
  Filled 2018-05-22 (×3): qty 1

## 2018-05-22 MED ORDER — UMECLIDINIUM BROMIDE 62.5 MCG/INH IN AEPB
1.0000 | INHALATION_SPRAY | Freq: Every day | RESPIRATORY_TRACT | Status: DC
  Administered 2018-05-22 – 2018-05-24 (×3): 1 via RESPIRATORY_TRACT
  Filled 2018-05-22: qty 7

## 2018-05-22 MED ORDER — SODIUM CHLORIDE 0.9% FLUSH
3.0000 mL | Freq: Two times a day (BID) | INTRAVENOUS | Status: DC
  Administered 2018-05-22 – 2018-05-24 (×5): 3 mL via INTRAVENOUS

## 2018-05-22 MED ORDER — FUROSEMIDE 40 MG PO TABS
40.0000 mg | ORAL_TABLET | Freq: Every day | ORAL | Status: DC
  Administered 2018-05-22 – 2018-05-23 (×2): 40 mg via ORAL
  Filled 2018-05-22 (×2): qty 1

## 2018-05-22 MED ORDER — PANTOPRAZOLE SODIUM 40 MG PO TBEC
40.0000 mg | DELAYED_RELEASE_TABLET | Freq: Every day | ORAL | Status: DC
  Administered 2018-05-22 – 2018-05-24 (×3): 40 mg via ORAL
  Filled 2018-05-22 (×3): qty 1

## 2018-05-22 MED ORDER — FLUTICASONE FUROATE-VILANTEROL 200-25 MCG/INH IN AEPB
1.0000 | INHALATION_SPRAY | Freq: Every day | RESPIRATORY_TRACT | Status: DC
  Administered 2018-05-22 – 2018-05-24 (×3): 1 via RESPIRATORY_TRACT
  Filled 2018-05-22: qty 28

## 2018-05-22 MED ORDER — FLUTICASONE-UMECLIDIN-VILANT 100-62.5-25 MCG/INH IN AEPB: 1.0000 | INHALATION_SPRAY | Freq: Every day | RESPIRATORY_TRACT | Status: DC

## 2018-05-22 MED ORDER — ONDANSETRON HCL 4 MG PO TABS: 4.0000 mg | ORAL_TABLET | Freq: Four times a day (QID) | ORAL | Status: DC | PRN

## 2018-05-22 MED ORDER — POTASSIUM CHLORIDE CRYS ER 10 MEQ PO TBCR
10.0000 meq | EXTENDED_RELEASE_TABLET | Freq: Every day | ORAL | Status: DC
  Administered 2018-05-22 – 2018-05-24 (×3): 10 meq via ORAL
  Filled 2018-05-22 (×3): qty 1

## 2018-05-22 MED ORDER — ASPIRIN EC 81 MG PO TBEC
81.0000 mg | DELAYED_RELEASE_TABLET | Freq: Every day | ORAL | Status: DC
  Administered 2018-05-22 – 2018-05-24 (×3): 81 mg via ORAL
  Filled 2018-05-22 (×3): qty 1

## 2018-05-22 MED ORDER — SODIUM CHLORIDE 0.9% FLUSH: 3.0000 mL | INTRAVENOUS | Status: DC | PRN

## 2018-05-22 MED ORDER — ACETAMINOPHEN 325 MG PO TABS: 650.0000 mg | ORAL_TABLET | Freq: Four times a day (QID) | ORAL | Status: DC | PRN

## 2018-05-22 MED ORDER — HYDRALAZINE HCL 20 MG/ML IJ SOLN: 10.0000 mg | INTRAMUSCULAR | Status: DC | PRN

## 2018-05-22 MED ORDER — METOPROLOL SUCCINATE ER 25 MG PO TB24
25.0000 mg | ORAL_TABLET | Freq: Every day | ORAL | Status: DC
  Administered 2018-05-22 – 2018-05-24 (×3): 25 mg via ORAL
  Filled 2018-05-22 (×4): qty 1

## 2018-05-22 MED ORDER — TETRACAINE HCL 0.5 % OP SOLN
1.0000 [drp] | Freq: Once | OPHTHALMIC | Status: AC
  Administered 2018-05-22: 1 [drp] via OPHTHALMIC
  Filled 2018-05-22: qty 4

## 2018-05-22 MED ORDER — ACETAMINOPHEN 650 MG RE SUPP: 650.0000 mg | Freq: Four times a day (QID) | RECTAL | Status: DC | PRN

## 2018-05-22 MED ORDER — LORAZEPAM 1 MG PO TABS
1.0000 mg | ORAL_TABLET | Freq: Every evening | ORAL | Status: DC | PRN
  Administered 2018-05-22 – 2018-05-23 (×2): 1 mg via ORAL
  Filled 2018-05-22 (×2): qty 1

## 2018-05-22 MED ORDER — NITROGLYCERIN 0.4 MG SL SUBL: 0.4000 mg | SUBLINGUAL_TABLET | SUBLINGUAL | Status: DC | PRN

## 2018-05-22 MED ORDER — FAMOTIDINE 20 MG PO TABS
20.0000 mg | ORAL_TABLET | Freq: Every day | ORAL | Status: DC
  Administered 2018-05-22 – 2018-05-23 (×2): 20 mg via ORAL
  Filled 2018-05-22 (×2): qty 1

## 2018-05-22 MED ORDER — RISAQUAD PO CAPS
1.0000 | ORAL_CAPSULE | Freq: Every day | ORAL | Status: DC
  Administered 2018-05-22 – 2018-05-24 (×3): 1 via ORAL
  Filled 2018-05-22 (×3): qty 1

## 2018-05-22 MED ORDER — DOCUSATE SODIUM 100 MG PO CAPS
100.0000 mg | ORAL_CAPSULE | Freq: Two times a day (BID) | ORAL | Status: DC
  Administered 2018-05-22 – 2018-05-24 (×5): 100 mg via ORAL
  Filled 2018-05-22 (×5): qty 1

## 2018-05-22 NOTE — ED Provider Notes (Addendum)
[facility] EMERGENCY DEPARTMENT Provider Note   CSN: [id number] Arrival date & time: 05/22/18  0056     History   Chief Complaint Chief Complaint  Patient presents with  . Loss of Vision    HPI [name] is a 84 y.o. female.  HPI  This is a 84 year old female with a history of coronary artery disease, chronic kidney disease, hypertension, hypercholesterolemia who presents with vision loss of the left eye.  Patient reports that she woke up at 11 PM tonight.  She went into the kitchen and got a graham cracker.  She had sudden onset of "a film" over her left eye.  She states initially it involved her entire eye but then settled into her lower visual field.  She states that she is unable to see anything in her lower visual field only in the left eye.  Initially it was dark and now she states that it is red in color.  She denies any pain in that eye.  She denies any headache.  She denies any speech difficulty, weakness, numbness, tingling, other strokelike symptoms.  Reports that she was last normal at 11 PM.  She is on aspirin only.  Known history of atrial fibrillation.  Past Medical History:  Diagnosis Date  . Anxiety   . Arthritis   . Asthma   . CAD in native artery    s/p CABG x 3; Last Myoview in 07/2009 - non-ischemic; Echo 03/2012  Aortic Sclerosis with normal EF.  . CKD (chronic kidney disease), stage III ([place])   . DVT (deep vein thrombosis) in pregnancy ([place])   . Esophageal perforation    9/13  . Fall 07/21/2014   FALL WITH INJURY  . Fracture of head of humerus   . Fracture of hip ([place]) 07/21/2014   LEFT  . GERD (gastroesophageal reflux disease)   . Hypercholesteremia   . Hypertension   . Hypothyroidism   . Mediastinitis    s/p drainage  . Myocardial infarction [facility])    Inferior STEMI 07/2008 - PCI of prox RCA followed byu CABG x 3 in 9/'09  . Pacemaker   . PAF (paroxysmal atrial fibrillation) ([place])   . Paroxysmal atrial flutter ([place]) 11/26/2014  . Pneumonia     . Shortness of breath   . SSS (sick sinus syndrome) ([place]) 11/26/2014  . Status post hip hemiarthroplasty left hip by Dr Ronnie Derby 08/08/2012    Patient Active Problem List   Diagnosis Date Noted  . Pacemaker battery depletion   . Acute on chronic diastolic heart failure ([place]) 06/21/2017  . Generalized weakness 05/13/2017  . Chest pain 05/09/2015  . SSS (sick sinus syndrome) ([place]) 11/26/2014  . Paroxysmal atrial flutter ([place]) 11/26/2014  . Abnormal results of liver function studies 10/01/2014  . Anxiety state 08/17/2014  . Cough 08/17/2014  . Fracture of humeral head, left, closed 07/22/2014  . Multiple pelvic fractures 07/22/2014  . Fall 07/21/2014  . GERD (gastroesophageal reflux disease) 06/09/2014  . Unstable angina ([place]) 04/15/2014  . C. difficile colitis 12/11/2012  . Abdominal pain, acute 12/06/2012  . Sigmoid diverticulitis 12/06/2012  . UTI (lower urinary tract infection) 12/06/2012  . S/P gastrostomy tube (G tube) placement, follow-up exam 10/17/2012  . Acute respiratory failure with hypoxia ([place]) 08/14/2012  . Pulmonary edema, noncardiac 08/14/2012  . Acute blood loss anemia 08/12/2012  . Mediastinitis due to esophageal perforation-s/p Incision and drainage, right neck and mediastinum, transcervical approach. Cervical esophagoscopy 08/11/2012  . S/P right total hip arthroplasty  by Dr Noemi Chapel in 2009 08/11/2012  . Odynophagia due to new traumatic peri-op esophageal perforation 08/09/2012  . Pre-operative clearance 08/08/2012  . Fracture of left hip after fall at home. 08/08/2012  . Aortic valve sclerosis, no stenosis 08/08/2012  . Carotid bruit present bilat, dopplers OK 6/13 08/08/2012  . History of UTI- recent completion 10 days Cipro 08/08/2012  . Hx of dizziness 08/08/2012  . CKD (chronic kidney disease), stage III ([place]) 08/08/2012  . PAF/SSS 08/08/2012  . Pacemaker March 2010 (MDT) 08/08/2012  . DVT after previous hip surgery 08/08/2012  . Status post hip  hemiarthroplasty left hip by Dr Ronnie Derby 08/08/2012 08/08/2012  . Closed fracture of left distal radius s/p ORIF by Dr Burney Gauze 08/08/2012 08/08/2012  . Radial head fracture, closed, Lt 08/07/2012  . Hypertension 08/07/2012  . Hyperlipidemia, statin intol. 08/07/2012  . CAD, CABG X 3 10/09. Myoview low risk 10/10. EF >55% 2D 6/13. 08/07/2012  . DYSPNEA 07/28/2009    Past Surgical History:  Procedure Laterality Date  . CARPAL TUNNEL RELEASE  08/08/2012   Procedure: CARPAL TUNNEL RELEASE;  Surgeon: Schuyler Amor, MD;  Location: [place];  Service: Orthopedics;  Laterality: Left;  . CORONARY ARTERY BYPASS GRAFT  2009   LIMA-LAD, SVG-RI, SVG-stented RCA  . ESOPHAGOGASTRODUODENOSCOPY  08/10/2012   Procedure: ESOPHAGOGASTRODUODENOSCOPY (EGD);  Surgeon: Beryle Beams, MD;  [facility] ENDOSCOPY;  Service: Endoscopy;  Laterality: N/A;  . ESOPHAGOSCOPY  08/10/2012   Procedure: ESOPHAGOSCOPY;  Surgeon: [name], MD;  Location: [place];  Service: ENT;  Laterality: N/A;  . GASTROSTOMY  08/16/2012   Procedure: GASTROSTOMY;  Surgeon: Zenovia Jarred, MD;  Location: [place];  Service: General;  Laterality: N/A;  . HIP ARTHROPLASTY  08/08/2012   Procedure: ARTHROPLASTY BIPOLAR HIP;  Surgeon: Rudean Haskell, MD;  Location: [place];  Service: Orthopedics;  Laterality: Left;  [place]   . JOINT REPLACEMENT    . LEFT HEART CATHETERIZATION WITH CORONARY ANGIOGRAM N/A 04/16/2014   Procedure: LEFT HEART CATHETERIZATION WITH CORONARY ANGIOGRAM;  Surgeon: Wellington Hampshire, MD;  Location: [place] CATH LAB;  Service: Cardiovascular;  Laterality: N/A;  . NM MYOCAR PERF WALL MOTION  08/14/2009   Normal  . PACEMAKER INSERTION  01/06/2009   [employer]  . PPM GENERATOR CHANGEOUT N/A 01/24/2018   Procedure: PPM GENERATOR CHANGEOUT;  Surgeon: Sanda Klein, MD;  Location: [place] CV LAB;  Service: Cardiovascular;  Laterality: N/A;  . TOTAL HIP ARTHROPLASTY    . WRIST FRACTURE SURGERY  07/2012   left intra articular  with  carpel tunnel     OB History   None      Home Medications    Prior to Admission medications   Medication Sig Start Date End Date Taking? Authorizing Provider  acetaminophen (TYLENOL) 500 MG tablet Take 1,000 mg by mouth every 6 (six) hours as needed for moderate pain.    [provider]  albuterol (PROVENTIL HFA;VENTOLIN HFA) 108 (90 Base) MCG/ACT inhaler Inhale 2 puffs into the lungs every 6 (six) hours as needed for wheezing or shortness of breath.     [provider]  aspirin EC 81 MG tablet Take 81 mg by mouth daily.    [provider]  Cholecalciferol (VITAMIN D3 PO) Take 1 tablet by mouth daily.    [provider]  cycloSPORINE (RESTASIS) 0.05 % ophthalmic emulsion Place 1 drop into both eyes 2 (two) times daily.    [provider]  docusate sodium (COLACE) 100 MG capsule  Take 100 mg by mouth 2 (two) times daily.    [provider]  Fluticasone-Umeclidin-Vilant (TRELEGY ELLIPTA) 100-62.5-25 MCG/INH AEPB Inhale 1 puff into the lungs daily.     [provider]  furosemide (LASIX) 40 MG tablet TAKE 1 TABLET BY MOUTH ONCE DAILY AS DIRECTED 02/26/18   Croitoru, Mihai, MD  Lactobacillus-Inulin (CULTURELLE DIGESTIVE HEALTH PO) Take 1 capsule by mouth daily.     [provider]  levothyroxine (SYNTHROID, LEVOTHROID) 50 MCG tablet Take 50 mcg by mouth daily before breakfast.  06/20/16   [provider]  LORazepam (ATIVAN) 1 MG tablet Take 1 mg by mouth at bedtime as needed for anxiety or sleep.     [provider]  methylPREDNISolone (MEDROL DOSEPAK) 4 MG TBPK tablet Use per pack directions 02/17/18   Davonna Belling, MD  metoprolol succinate (TOPROL XL) 25 MG 24 hr tablet Take 1 tablet (25 mg total) by mouth daily. 02/14/18   Croitoru, Mihai, MD  nitroGLYCERIN (NITROSTAT) 0.4 MG SL tablet Place 1 tablet (0.4 mg total) under the tongue every 5 (five) minutes x 3 doses as needed for chest pain. 04/17/14    Barrett, Evelene Croon, PA-C  omeprazole (PRILOSEC) 40 MG capsule TAKE ONE CAPSULE BY MOUTH ONCE DAILY Patient taking differently: TAKE 40 MG BY MOUTH ONCE DAILY 07/25/17   Setzer, Rona Ravens, NP  potassium chloride (K-DUR) 10 MEQ tablet Take 1 tablet (10 mEq total) by mouth daily as directed. Patient taking differently: Take 10 mEq by mouth daily.  12/26/17   Croitoru, Mihai, MD  ranitidine (ZANTAC) 150 MG tablet TAKE ONE TABLET BY MOUTH ONCE DAILY AT BEDTIME Patient taking differently: TAKE 150 MG BY MOUTH ONCE DAILY AT BEDTIME 08/21/17   Setzer, Rona Ravens, NP    Family History Family History  Problem Relation Age of Onset  . Stroke Father     Social History Social History   Tobacco Use  . Smoking status: Never Smoker  . Smokeless tobacco: Never Used  Substance Use Topics  . Alcohol use: No  . Drug use: No     Allergies   Colestipol; Hydrocodone-acetaminophen; Niacin and related; Statins; and Zetia [ezetimibe]   Review of Systems Review of Systems  Constitutional: Negative for fever.  Eyes: Positive for visual disturbance. Negative for photophobia, pain, discharge and redness.  Respiratory: Negative for shortness of breath.   Cardiovascular: Negative for chest pain.  Gastrointestinal: Negative for abdominal pain.  Genitourinary: Negative for dysuria.  Musculoskeletal: Negative for gait problem.  Neurological: Negative for speech difficulty, weakness and headaches.  All other systems reviewed and are negative.    Physical Exam Updated Vital Signs BP (!) 187/72 (BP Location: Left Arm)   Pulse 77   Temp 99.2 F (37.3 C) (Oral)   Resp 18   Ht 5' 5.5" (1.664 m)   Wt 79.4 kg (175 lb)   SpO2 94%   BMI 28.68 kg/m   Physical Exam  Constitutional: She is oriented to person, place, and time.  Elderly but nontoxic-appearing, no acute distress  HENT:  Head: Normocephalic and atraumatic.  Eyes: Pupils are equal, round, and reactive to light. Conjunctivae and EOM are normal.    Pupils 4 mm reactive bilaterally, funduscopic exam nondilated with redness and blurring of the macula, no obvious vitreous hemorrhage, no debris in the anterior chamber, pressure left eye 14, pressure right eye 13  Neck: Neck supple.  Cardiovascular: Normal rate, regular rhythm and normal heart sounds.  Pulmonary/Chest: Effort normal and breath sounds  normal. No respiratory distress. She has no wheezes.  Abdominal: Soft. There is no tenderness.  Neurological: She is alert and oriented to person, place, and time.  Cranial nerves II through XII intact, 5 out of 5 strength in all 4 extremities, normal gait, no drift  Skin: Skin is warm and dry.  Psychiatric: She has a normal mood and affect.  Nursing note and vitals reviewed.    ED Treatments / Results  Labs (all labs ordered are listed, but only abnormal results are displayed) Labs Reviewed  COMPREHENSIVE METABOLIC PANEL - Abnormal; Notable for the following components:      Result Value   Glucose, Bld 109 (*)    BUN 30 (*)    Creatinine, Ser 1.63 (*)    GFR calc non Af Amer 26 (*)    GFR calc Af Amer 30 (*)    All other components within normal limits  PROTIME-INR  APTT  CBC  DIFFERENTIAL  URINALYSIS, ROUTINE W REFLEX MICROSCOPIC  SEDIMENTATION RATE  C-REACTIVE PROTEIN  I-STAT TROPONIN, ED    EKG EKG Interpretation  Date/Time:  Tuesday May 22 2018 01:54:31 EDT Ventricular Rate:  73 PR Interval:    QRS Duration: 93 QT Interval:  417 QTC Calculation: 460 R Axis:   -27 Text Interpretation:  Sinus rhythm Prolonged PR interval Borderline left axis deviation Probable anteroseptal infarct, old Confirmed by Thayer Jew [phone]) on 05/22/2018 2:43:21 AM   Radiology Ct Head Wo Contrast  Result Date: 05/22/2018 CLINICAL DATA:  Visual loss, sudden loss of vision in the left eye EXAM: CT HEAD WITHOUT CONTRAST TECHNIQUE: Contiguous axial images were obtained from the base of the skull through the vertex without intravenous  contrast. COMPARISON:  05/13/2017 FINDINGS: Brain: No acute territorial infarction, hemorrhage or intracranial mass. Moderate atrophy. Mild small vessel ischemic changes of the white matter. Stable ventricle size. Vascular: No hyperdense vessels. Vertebral artery and carotid vascular calcification Skull: Normal. Negative for fracture or focal lesion. Sinuses/Orbits: No acute finding. Other: None IMPRESSION: 1. No CT evidence for acute intracranial abnormality. 2. Atrophy and small vessel ischemic changes of the white matter. Electronically Signed   By: Donavan Foil M.D.   On: 05/22/2018 02:04    Procedures Procedures (including critical care time)  EMERGENCY DEPARTMENT [place] OCULAR EXAM "Study: Limited Ultrasound of Orbit "  INDICATIONS: Vision loss Linear probe utilized to obtain images in both long and short axis of the orbit having the patient look left and right if possible.  PERFORMED BY: Myself IMAGES ARCHIVED?: Yes LIMITATIONS: none VIEWS USED: Left orbit INTERPRETATION: No retinal detachment, Lens in proper position   Medications Ordered in ED Medications  tetracaine (PONTOCAINE) 0.5 % ophthalmic solution 1 drop (1 drop Both Eyes Given 05/22/18 0235)     Initial Impression / Assessment and Plan / ED Course  I have reviewed the triage vital signs and the nursing notes.  Pertinent labs & imaging results that were available during my care of the patient were reviewed by me and considered in my medical decision making (see chart for details).  Clinical Course as of May 22 301  Tue May 22, 2018  0220 Discussed with Dr. Graylon Good, tele-neurology.  Case was discussed.  No indication for code stroke or TPA.  Likely AION.  Recommends addition of sed rate and CRP.  Additionally ultrasound of ipsilateral carotid.   [CH]  7062 Patient discussed with hospitalist, Dr. Manuella Ghazi.  Plan for admission.  Given possible need for dilated eye exam and/or further neurology  consultation, [facility].   [CH]    Clinical Course User Index [CH] Ashantee Deupree, Barbette Hair, MD    Patient presents with acute monocular painless vision loss in the left eye.  No other signs or symptoms of stroke.  She is otherwise nontoxic-appearing.  Vital signs notable for blood pressure 187/72.  Considerations include retinal detachment, vitreous hemorrhage, CRAO, AION.  Code stroke was not activated initially as patient had no other neurologic deficits.  Formal eye exam and slit lip exam was initiated.  This was nondilated.  There is no evidence of retinal detachment on ultrasound.  No evidence of debris.  She does have some discoloration at the macula.  Patient was discussed with tele-neurology who felt her presentation was very consistent with AI ON.  Inflammatory markers were sent and we will plan for admission to the hospital for further evaluation including carotid Dopplers.  Final Clinical Impressions(s) / ED Diagnoses   Final diagnoses:  Vision loss of left eye    ED Discharge Orders    None       Chue Berkovich, Barbette Hair, MD 05/22/18 [id number]    Merryl Hacker, MD 05/22/18 [phone]

## 2018-05-22 NOTE — Consult Note (Addendum)
Neurology Consultation  Reason for Consult: loss of vision Referring Physician: Danford  CC: loss of vision  History is obtained from: chart and patient  HPI: Amy [name] is a 84 y.o. female significant past medical history of proximal A. fib but not on Coumadin secondary to fall risk.  She is currently on aspirin.  Patient also has a history of CAD, carotid stenosis, DVT in pregnancy, multiple falls, hypertension, hyperlipidemia(per cardiology patient has severe hyperlipidemia with an LDL cholesterol in the past excessive of 200--most recent LDL that was obtained during hospitalization is 210.  She is also statin, niacin, ezetimibe and resin intolerant), and sick sinus syndrome.  Patient awoke at 11 PM on Monday night with knows that she had lost all vision in her left eye.  She says during this time she was seeing flashing floaters.  Patient waited a good hour before she awoke her family member and at that time they waited till the next morning to come into the emergency room.  They went to the emergency room at [facility] where telemetry neurology had seen the patient and did not deem her TPA candidate.  However there was concerned for central retinal artery occlusion especially since the ultrasound showed a 50-70% plaque on the left carotid artery.while patient was in the emergency room she had a  fundoscopy exam was obtained showing "redness and blurring of the macula, no obvious vitreous hemorrhage, no debris's in the anterior chamber, pressure left eye was 14, pressure right eye was 13." Per cardiology notes patient is not on any anticoagulant--"In 2010 a permanent pacemaker (Medtronic) was implanted for symptomatic bradycardia.  She underwent a generator change out in March 2019.  She has paroxysmal atrial fibrillation and atrial flutter, but she is not taking anticoagulants to fall/bleeding risk. She does take aspirin."  LKW: 2300 hrs. on 05/21/2018 tpa given?: no, symptoms resolving Premorbid  modified Rankin scale (mRS): 0 NIH stroke scale of 1 Hypertension, hypercholesterolemia, PAF  ROS: A 14 point ROS was performed and is negative except as noted in the HPI.   Past Medical History:  Diagnosis Date  . Anxiety   . Arthritis   . Asthma   . CAD in native artery    s/p CABG x 3; Last Myoview in 07/2009 - non-ischemic; Echo 03/2012  Aortic Sclerosis with normal EF.  . CKD (chronic kidney disease), stage III ([place])   . DVT (deep vein thrombosis) in pregnancy ([place])   . Esophageal perforation    9/13  . Fall 07/21/2014   FALL WITH INJURY  . Fracture of head of humerus   . Fracture of hip ([place]) 07/21/2014   LEFT  . GERD (gastroesophageal reflux disease)   . Hypercholesteremia   . Hypertension   . Hypothyroidism   . Mediastinitis    s/p drainage  . Myocardial infarction [facility])    Inferior STEMI 07/2008 - PCI of prox RCA followed byu CABG x 3 in 9/'09  . Pacemaker   . PAF (paroxysmal atrial fibrillation) ([place])   . Paroxysmal atrial flutter ([place]) 11/26/2014  . Pneumonia   . Shortness of breath   . SSS (sick sinus syndrome) ([place]) 11/26/2014  . Status post hip hemiarthroplasty left hip by Dr Ronnie Derby 08/08/2012     Family History  Problem Relation Age of Onset  . Stroke Father      Social History:   reports that she has never smoked. She has never used smokeless tobacco. She reports that she does not drink alcohol or  use drugs.  Medications  Current Facility-Administered Medications:  .  0.9 %  sodium chloride infusion, 250 mL, Intravenous, PRN, Manuella Ghazi, Pratik D, DO .  acetaminophen (TYLENOL) tablet 650 mg, 650 mg, Oral, Q6H PRN **OR** acetaminophen (TYLENOL) suppository 650 mg, 650 mg, Rectal, Q6H PRN, Manuella Ghazi, Pratik D, DO .  acidophilus (RISAQUAD) capsule 1 capsule, 1 capsule, Oral, Daily, Manuella Ghazi, Pratik D, DO, 1 capsule at 05/22/18 1124 .  aspirin EC tablet 81 mg, 81 mg, Oral, Daily, Manuella Ghazi, Pratik D, DO, 81 mg at 05/22/18 1124 .  cholecalciferol (VITAMIN D) tablet 1,000  Units, 1,000 Units, Oral, Daily, Manuella Ghazi, Pratik D, DO, 1,000 Units at 05/22/18 1124 .  docusate sodium (COLACE) capsule 100 mg, 100 mg, Oral, BID, Manuella Ghazi, Pratik D, DO, 100 mg at 05/22/18 1124 .  famotidine (PEPCID) tablet 20 mg, 20 mg, Oral, QHS, Shah, Pratik D, DO .  fluticasone furoate-vilanterol (BREO ELLIPTA) 200-25 MCG/INH 1 puff, 1 puff, Inhalation, Daily, 1 puff at 05/22/18 1132 **AND** umeclidinium bromide (INCRUSE ELLIPTA) 62.5 MCG/INH 1 puff, 1 puff, Inhalation, Daily, Skeet Simmer, RPH, 1 puff at 05/22/18 1216 .  furosemide (LASIX) tablet 40 mg, 40 mg, Oral, Daily, Manuella Ghazi, Pratik D, DO, 40 mg at 05/22/18 1124 .  hydrALAZINE (APRESOLINE) injection 10 mg, 10 mg, Intravenous, Q4H PRN, Manuella Ghazi, Pratik D, DO .  levothyroxine (SYNTHROID, LEVOTHROID) tablet 50 mcg, 50 mcg, Oral, QAC breakfast, Manuella Ghazi, Pratik D, DO, 50 mcg at 05/22/18 1124 .  LORazepam (ATIVAN) tablet 1 mg, 1 mg, Oral, QHS PRN, Manuella Ghazi, Pratik D, DO .  metoprolol succinate (TOPROL-XL) 24 hr tablet 25 mg, 25 mg, Oral, Daily, Manuella Ghazi, Pratik D, DO, 25 mg at 05/22/18 1124 .  nitroGLYCERIN (NITROSTAT) SL tablet 0.4 mg, 0.4 mg, Sublingual, Q5 Min x 3 PRN, Manuella Ghazi, Pratik D, DO .  ondansetron (ZOFRAN) tablet 4 mg, 4 mg, Oral, Q6H PRN **OR** ondansetron (ZOFRAN) injection 4 mg, 4 mg, Intravenous, Q6H PRN, Manuella Ghazi, Pratik D, DO .  pantoprazole (PROTONIX) EC tablet 40 mg, 40 mg, Oral, Daily, Manuella Ghazi, Pratik D, DO, 40 mg at 05/22/18 1124 .  potassium chloride (K-DUR,KLOR-CON) CR tablet 10 mEq, 10 mEq, Oral, Daily, Manuella Ghazi, Pratik D, DO, 10 mEq at 05/22/18 1124 .  sodium chloride flush (NS) 0.9 % injection 3 mL, 3 mL, Intravenous, Q12H, Shah, Pratik D, DO, 3 mL at 05/22/18 1131 .  sodium chloride flush (NS) 0.9 % injection 3 mL, 3 mL, Intravenous, PRN, Manuella Ghazi, Pratik D, DO   Exam: Current vital signs: BP (!) 159/76 (BP Location: Right Arm)   Pulse 77   Temp 98 F (36.7 C) (Oral)   Resp 18   Ht 5' 5.5" (1.664 m)   Wt 79.4 kg (175 lb)   SpO2 93%   BMI 28.68  kg/m  Vital signs in last 24 hours: Temp:  [98 F (36.7 C)-99.2 F (37.3 C)] 98 F (36.7 C) (07/23 0932) Pulse Rate:  [59-91] 77 (07/23 0932) Resp:  [18] 18 (07/23 0932) BP: (135-187)/(51-118) 159/76 (07/23 0932) SpO2:  [91 %-96 %] 93 % (07/23 1216) Weight:  [79.4 kg (175 lb)] 79.4 kg (175 lb) (07/23 0108)  GENERAL: Awake, alert in NAD HEENT: - Normocephalic and atraumatic,  LUNGS - Clear to auscultation bilaterally with no wheezes CV - S1S2 RRR, no m/r/g, equal pulses bilaterally. ABDOMEN - Soft, nontender, nondistended with normoactive BS Ext: warm, well perfused, intact peripheral pulses,  NEURO:  Fundus: unable to visualize on undilated exam Mental Status: AA&Ox3  Language: speech is clear.  Naming,  repetition, fluency, and comprehension intact. Cranial Nerves: PERRL 2 mm/brisk with afferent pupillary defect on the left eye. EOMI, impaired over left upper quadrant and both lower quadrants, no facial asymmetry,  facial sensation intact, hearing intact, tongue/uvula/soft palate midline, normal sternocleidomastoid and trapezius muscle strength.  Motor: 5/5 strength in all extremities with the exception of left shoulder secondary to left shoulder injury which was never fixed Tone: is normal and bulk is normal Sensation- Intact to light touch bilaterally Coordination: FTN intact bilaterally,  Gait- deferred  Labs I have reviewed labs in epic and the results pertinent to this consultation are: ESR 35  CBC    Component Value Date/Time   WBC 7.5 05/22/2018 0124   RBC 4.19 05/22/2018 0124   HGB 12.9 05/22/2018 0124   HGB 13.2 01/10/2018 1433   HCT 38.7 05/22/2018 0124   HCT 40.4 01/10/2018 1433   PLT 218 05/22/2018 0124   PLT 270 01/10/2018 1433   MCV 92.4 05/22/2018 0124   MCV 90 01/10/2018 1433   MCH 30.8 05/22/2018 0124   MCHC 33.3 05/22/2018 0124   RDW 14.7 05/22/2018 0124   RDW 14.0 01/10/2018 1433   LYMPHSABS 2.2 05/22/2018 0124   MONOABS 0.6 05/22/2018 0124    EOSABS 0.3 05/22/2018 0124   BASOSABS 0.0 05/22/2018 0124    CMP     Component Value Date/Time   NA 137 05/22/2018 0124   NA 141 01/10/2018 1433   K 4.0 05/22/2018 0124   CL 98 05/22/2018 0124   CO2 29 05/22/2018 0124   GLUCOSE 109 (H) 05/22/2018 0124   BUN 30 (H) 05/22/2018 0124   BUN 31 01/10/2018 1433   CREATININE 1.63 (H) 05/22/2018 0124   CREATININE 1.19 (H) 03/05/2015 1157   CALCIUM 9.4 05/22/2018 0124   PROT 7.1 05/22/2018 0124   ALBUMIN 3.8 05/22/2018 0124   AST 23 05/22/2018 0124   ALT 16 05/22/2018 0124   ALKPHOS 87 05/22/2018 0124   BILITOT 0.5 05/22/2018 0124   GFRNONAA 26 (L) 05/22/2018 0124   GFRAA 30 (L) 05/22/2018 0124    Lipid Panel     Component Value Date/Time   CHOL 293 (H) 04/16/2014 0422   TRIG 178 (H) 04/16/2014 0422   HDL 47 04/16/2014 0422   CHOLHDL 6.2 04/16/2014 0422   VLDL 36 04/16/2014 0422   LDLCALC 210 (H) 04/16/2014 0422     Imaging I have reviewed the images obtained:  CT-scan of the brain--no CT evidence of acute intracranial abnormality  MRI examination of the brain--negative noncontrast MRI of the head, 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  Along with some multifocal severe stenosis of the anterior and posterior circulation compatible with atherosclerosis.  Carotid Dopplers-- IMPRESSION: 1. Large amount of left-sided atherosclerotic plaque results in elevated peak systolic velocities within the left internal carotid artery compatible with the 50-69% luminal narrowing range. Further evaluation with CTA could be performed as clinically indicated. 2. Moderate to large amount of right-sided atherosclerotic plaque, not definitely resulting in a hemodynamically significant stenosis.  Echocardiogram--size was normal. There was mild   concentric hypertrophy. Systolic function was normal. The   estimated ejection fraction was in the range of 60% to 65%. Wall   motion was normal; there were no  regional wall motion   abnormalities.    NEUROHOSPITALIST ADDENDUM Seen and examined the patient today. I have reviewed the contents of history and physical exam as documented by PA/ARNP/Resident and agree with above documentation.  I  have discussed and formulated the above plan as documented. Edits to the note have been made as needed.    Assessment: 84 year old female presenting with acute onset loss of vision of left eye which has improved and subsequently worsened again.  She has a history of atrial fibrillation and carotid Doppler shows 50 to 70% stenosis with atherosclerotic plaque.  MRI showed no stroke however MR angiogram showed severe intracranial atherosclerotic disease.  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 The   estimated ejection fraction was in the range of 60% to 65%  # As patient has Afib, she is a candidate for anticoagulation. ( appears open to it - last fall >3years ago)  Would continue ASA due to CAD and Intracranial as well as carotid atherosclerotic disease. If she decides against AC, would start Plavix 68m daily.  #Patient allergic to statins due to muscle cramps, considered low-dose statin. # BP goal: permissive HTN upto 220/110 mmHg # HBAIC and Lipid profile # Telemetry monitoring # Frequent neuro checks # PT/OT #Opthalmalogy consult  Please page stroke NP  Or  PA  Or MD from 8am -4 pm  as this patient from this time will be  followed by the stroke.   You can look them up on [email or web address]  Password [email or web address]      SKarena AddisonAroor MD [facility] [phone]  If 7pm to 7am, please call on call as listed on AMION.

## 2018-05-22 NOTE — ED Notes (Signed)
ED Provider at bedside. 

## 2018-05-22 NOTE — ED Notes (Signed)
Patient transported to CT 

## 2018-05-22 NOTE — Progress Notes (Signed)
Medtronic Pacemaker information: Implant Date:  01/24/18 Serial Number:  [id number]  Model Number:  [place] ?s or Emergency:  [place] serv:  [phone]

## 2018-05-22 NOTE — Progress Notes (Signed)
Patient admitted to the hospital earlier this morning by Dr. Manuella Ghazi.  Patient seen and examined.  She does not have any unilateral weakness or numbness.  Denies any pain in her left eye.  Reports seeing blue spots in her left eye at this time.  This is more noticeable to her when she covers her right eye.  She has not had any discharge from her left eye.  She has not had any headache.  Pupils are 4 mm and reactive bilaterally.  Conjunctive are unremarkable.  Extraocular motions are intact  84 year old female who initially presented to the hospital with monocular vision loss.  Overall, she feels that her vision has mildly improved and now she is seeing blue spots in the center of her visual field as opposed to complete vision loss on the left side.  She was initially seen by tele neurology who felt that stroke was unlikely.  She has undergone carotid Dopplers here with reports pending.  It was felt that she may have an acute ischemic optic neuropathy.  Funduscopic exam was done by emergency room physician.  This was a nondilated exam.  It was noted that she had redness and blurring of the macula, no obvious vitreous hemorrhage, no debris in the anterior chamber, pressure left eye was 14, pressure right eye was 13.  Plan per admitting physician was to transfer the patient to [facility] for further ophthalmology evaluation and possible neurology evaluation.  I discussed the case with Dr. Cher Nakai, on-call for ophthalmology.  Recommendations were to have patient follow-up in the office for detailed ophthalmology evaluation.  She can be working on the same day.  I discussed the case with Dr. Loleta Books at [facility].  He will likely evaluate the patient with further MRI and help arrange further ophthalmology evaluation.  Patient otherwise stable at the time of transfer.  [employer]

## 2018-05-22 NOTE — Progress Notes (Signed)
Dr. Loleta Books notified about patient having increased vision loss again in left eye. He will notify Neurology and no further orders at this time.

## 2018-05-22 NOTE — H&P (Signed)
History and Physical    Amy [name] JFH:[id number] DOB: December 11, 1921 DOA: 05/22/2018  PCP: Celene Squibb, MD   Patient coming from: Home  Chief Complaint: Acute visual loss  HPI: Amy [name] is a 84 y.o. female with medical history significant for CAD with prior CABG, CKD stage III, hypertension, hypothyroidism, sick sinus syndrome status post permanent pacemaker placement, dyslipidemia, and GERD who presented to the emergency department tonight upon noticing that she had a "film", over her left eye which then settled into her lower visual field.  She noticed this soon after she woke up at approximately 11 PM as she was trying to go to the kitchen to grab a snack.  She denies any trauma and states that it just began spontaneously as she was heading to the kitchen.  There is no pain to her eye and she states that she has had some slow improvement and notes that she can see some grayish colors to the lower quadrants of her left eye that she could not see initially. She denies any headache, lightheadedness, dizziness, speech difficulty, weakness, numbness, tingling or any other strokelike symptomatology.   ED Course: Vital signs are stable with some mildly elevated blood pressure readings.  EKG was sinus rhythm at 73 bpm and no prior history of atrial fibrillation.  Laboratory data with stable creatinine of 1.63.  CT of the head with no acute findings.  EDP had spoken with tele-neurology who feels that patient very likely has not had a stroke and more likely has some component of anterior ischemic optic neuropathy.  Review of Systems: All others reviewed and otherwise negative.  Past Medical History:  Diagnosis Date  . Anxiety   . Arthritis   . Asthma   . CAD in native artery    s/p CABG x 3; Last Myoview in 07/2009 - non-ischemic; Echo 03/2012  Aortic Sclerosis with normal EF.  . CKD (chronic kidney disease), stage III ([address])   . DVT (deep vein thrombosis) in pregnancy ([place])   . Esophageal  perforation    9/13  . Fall 07/21/2014   FALL WITH INJURY  . Fracture of head of humerus   . Fracture of hip ([place]) 07/21/2014   LEFT  . GERD (gastroesophageal reflux disease)   . Hypercholesteremia   . Hypertension   . Hypothyroidism   . Mediastinitis    s/p drainage  . Myocardial infarction [facility])    Inferior STEMI 07/2008 - PCI of prox RCA followed byu CABG x 3 in 9/'09  . Pacemaker   . PAF (paroxysmal atrial fibrillation) ([place])   . Paroxysmal atrial flutter ([place]) 11/26/2014  . Pneumonia   . Shortness of breath   . SSS (sick sinus syndrome) ([place]) 11/26/2014  . Status post hip hemiarthroplasty left hip by Dr Ronnie Derby 08/08/2012    Past Surgical History:  Procedure Laterality Date  . CARPAL TUNNEL RELEASE  08/08/2012   Procedure: CARPAL TUNNEL RELEASE;  Surgeon: Schuyler Amor, MD;  Location: [place];  Service: Orthopedics;  Laterality: Left;  . CORONARY ARTERY BYPASS GRAFT  2009   LIMA-LAD, SVG-RI, SVG-stented RCA  . ESOPHAGOGASTRODUODENOSCOPY  08/10/2012   Procedure: ESOPHAGOGASTRODUODENOSCOPY (EGD);  Surgeon: Beryle Beams, MD;  Location: [facility] ENDOSCOPY;  Service: Endoscopy;  Laterality: N/A;  . ESOPHAGOSCOPY  08/10/2012   Procedure: ESOPHAGOSCOPY;  Surgeon: [name], MD;  Location: [place];  Service: ENT;  Laterality: N/A;  . GASTROSTOMY  08/16/2012   Procedure: GASTROSTOMY;  Surgeon: Zenovia Jarred, MD;  Location: [place];  Service: General;  Laterality: N/A;  . HIP ARTHROPLASTY  08/08/2012   Procedure: ARTHROPLASTY BIPOLAR HIP;  Surgeon: Rudean Haskell, MD;  Location: [place];  Service: Orthopedics;  Laterality: Left;  [place]   . JOINT REPLACEMENT    . LEFT HEART CATHETERIZATION WITH CORONARY ANGIOGRAM N/A 04/16/2014   Procedure: LEFT HEART CATHETERIZATION WITH CORONARY ANGIOGRAM;  Surgeon: Wellington Hampshire, MD;  Location: [place] CATH LAB;  Service: Cardiovascular;  Laterality: N/A;  . NM MYOCAR PERF WALL MOTION  08/14/2009   Normal  . PACEMAKER INSERTION  01/06/2009   [employer]    . PPM GENERATOR CHANGEOUT N/A 01/24/2018   Procedure: PPM GENERATOR CHANGEOUT;  Surgeon: Sanda Klein, MD;  Location: [place] CV LAB;  Service: Cardiovascular;  Laterality: N/A;  . TOTAL HIP ARTHROPLASTY    . WRIST FRACTURE SURGERY  07/2012   left intra articular  with carpel tunnel     reports that she has never smoked. She has never used smokeless tobacco. She reports that she does not drink alcohol or use drugs.  Allergies  Allergen Reactions  . Colestipol Other (See Comments)    Muscle aches  . Hydrocodone-Acetaminophen Itching and Other (See Comments)    Tolerates tylenol  . Niacin And Related Other (See Comments)    Unknown  . Statins Other (See Comments)    Muscle aches on all she has tried.  She recalls Lipitor, Crestor, and Livalo but thinks there were others  . Zetia [Ezetimibe] Other (See Comments)    Muscle aches    Family History  Problem Relation Age of Onset  . Stroke Father     Prior to Admission medications   Medication Sig Start Date End Date Taking? Authorizing Provider  acetaminophen (TYLENOL) 500 MG tablet Take 1,000 mg by mouth every 6 (six) hours as needed for moderate pain.    [provider]  albuterol (PROVENTIL HFA;VENTOLIN HFA) 108 (90 Base) MCG/ACT inhaler Inhale 2 puffs into the lungs every 6 (six) hours as needed for wheezing or shortness of breath.     [provider]  aspirin EC 81 MG tablet Take 81 mg by mouth daily.    [provider]  Cholecalciferol (VITAMIN D3 PO) Take 1 tablet by mouth daily.    [provider]  cycloSPORINE (RESTASIS) 0.05 % ophthalmic emulsion Place 1 drop into both eyes 2 (two) times daily.    [provider]  docusate sodium (COLACE) 100 MG capsule Take 100 mg by mouth 2 (two) times daily.    [provider]  Fluticasone-Umeclidin-Vilant (TRELEGY ELLIPTA) 100-62.5-25 MCG/INH AEPB Inhale 1 puff into the lungs daily.     [provider]  furosemide  (LASIX) 40 MG tablet TAKE 1 TABLET BY MOUTH ONCE DAILY AS DIRECTED 02/26/18   Croitoru, Mihai, MD  Lactobacillus-Inulin (CULTURELLE DIGESTIVE HEALTH PO) Take 1 capsule by mouth daily.     [provider]  levothyroxine (SYNTHROID, LEVOTHROID) 50 MCG tablet Take 50 mcg by mouth daily before breakfast.  06/20/16   [provider]  LORazepam (ATIVAN) 1 MG tablet Take 1 mg by mouth at bedtime as needed for anxiety or sleep.     [provider]  methylPREDNISolone (MEDROL DOSEPAK) 4 MG TBPK tablet Use per pack directions 02/17/18   Davonna Belling, MD  metoprolol succinate (TOPROL XL) 25 MG 24 hr tablet Take 1 tablet (25 mg total) by mouth daily. 02/14/18   Croitoru, Mihai, MD  nitroGLYCERIN (NITROSTAT) 0.4 MG SL  tablet Place 1 tablet (0.4 mg total) under the tongue every 5 (five) minutes x 3 doses as needed for chest pain. 04/17/14   Barrett, Evelene Croon, PA-C  omeprazole (PRILOSEC) 40 MG capsule TAKE ONE CAPSULE BY MOUTH ONCE DAILY Patient taking differently: TAKE 40 MG BY MOUTH ONCE DAILY 07/25/17   Setzer, Rona Ravens, NP  potassium chloride (K-DUR) 10 MEQ tablet Take 1 tablet (10 mEq total) by mouth daily as directed. Patient taking differently: Take 10 mEq by mouth daily.  12/26/17   Croitoru, Mihai, MD  ranitidine (ZANTAC) 150 MG tablet TAKE ONE TABLET BY MOUTH ONCE DAILY AT BEDTIME Patient taking differently: TAKE 150 MG BY MOUTH ONCE DAILY AT BEDTIME 08/21/17   Butch Penny, NP    Physical Exam: Vitals:   05/22/18 0108 05/22/18 0110  BP:  (!) 187/72  Pulse:  77  Resp:  18  Temp:  99.2 F (37.3 C)  TempSrc:  Oral  SpO2:  94%  Weight: 79.4 kg (175 lb)   Height: 5' 5.5" (1.664 m)     Constitutional: NAD, calm, comfortable Vitals:   05/22/18 0108 05/22/18 0110  BP:  (!) 187/72  Pulse:  77  Resp:  18  Temp:  99.2 F (37.3 C)  TempSrc:  Oral  SpO2:  94%  Weight: 79.4 kg (175 lb)   Height: 5' 5.5" (1.664 m)    Eyes: lids and conjunctivae normal; pupils  reactive to light bilaterally with no obvious vitreous hemorrhage or debris in the anterior chamber.  Funduscopic exam and ocular pressures as noted in ED physician note. ENMT: Mucous membranes are moist.  Neck: normal, supple Respiratory: clear to auscultation bilaterally. Normal respiratory effort. No accessory muscle use.  Cardiovascular: Regular rate and rhythm, no murmurs. No extremity edema. Abdomen: no tenderness, no distention. Bowel sounds positive.  Musculoskeletal:  No joint deformity upper and lower extremities.   Neurological: Cranial nerves III through XII appear to be grossly intact.  Cranial nerve II examination with visual deficits noted to the left eye.  No focal motor or sensory deficits noted. Skin: no rashes, lesions, ulcers.  Psychiatric: Normal judgment and insight. Alert and oriented x 3. Normal mood.   Labs on Admission: I have personally reviewed following labs and imaging studies  CBC: Recent Labs  Lab 05/22/18 0124  WBC 7.5  NEUTROABS 4.4  HGB 12.9  HCT 38.7  MCV 92.4  PLT 655   Basic Metabolic Panel: Recent Labs  Lab 05/22/18 0124  NA 137  K 4.0  CL 98  CO2 29  GLUCOSE 109*  BUN 30*  CREATININE 1.63*  CALCIUM 9.4   GFR: Estimated Creatinine Clearance: 22.2 mL/min (A) (by C-G formula based on SCr of 1.63 mg/dL (H)). Liver Function Tests: Recent Labs  Lab 05/22/18 0124  AST 23  ALT 16  ALKPHOS 87  BILITOT 0.5  PROT 7.1  ALBUMIN 3.8   No results for input(s): LIPASE, AMYLASE in the last 168 hours. No results for input(s): AMMONIA in the last 168 hours. Coagulation Profile: Recent Labs  Lab 05/22/18 0124  INR 0.91   Cardiac Enzymes: No results for input(s): CKTOTAL, CKMB, CKMBINDEX, TROPONINI in the last 168 hours. BNP (last 3 results) No results for input(s): PROBNP in the last 8760 hours. HbA1C: No results for input(s): HGBA1C in the last 72 hours. CBG: No results for input(s): GLUCAP in the last 168 hours. Lipid  Profile: No results for input(s): CHOL, HDL, LDLCALC, TRIG, CHOLHDL, LDLDIRECT in the last 72  hours. Thyroid Function Tests: No results for input(s): TSH, T4TOTAL, FREET4, T3FREE, THYROIDAB in the last 72 hours. Anemia Panel: No results for input(s): VITAMINB12, FOLATE, FERRITIN, TIBC, IRON, RETICCTPCT in the last 72 hours. Urine analysis:    Component Value Date/Time   COLORURINE STRAW (A) 05/22/2018 0124   APPEARANCEUR CLEAR 05/22/2018 0124   LABSPEC 1.010 05/22/2018 0124   PHURINE 7.0 05/22/2018 0124   GLUCOSEU NEGATIVE 05/22/2018 0124   HGBUR NEGATIVE 05/22/2018 0124   BILIRUBINUR NEGATIVE 05/22/2018 0124   KETONESUR NEGATIVE 05/22/2018 0124   PROTEINUR NEGATIVE 05/22/2018 0124   UROBILINOGEN 0.2 07/31/2014 1641   NITRITE NEGATIVE 05/22/2018 0124   LEUKOCYTESUR NEGATIVE 05/22/2018 0124    Radiological Exams on Admission: Ct Head Wo Contrast  Result Date: 05/22/2018 CLINICAL DATA:  Visual loss, sudden loss of vision in the left eye EXAM: CT HEAD WITHOUT CONTRAST TECHNIQUE: Contiguous axial images were obtained from the base of the skull through the vertex without intravenous contrast. COMPARISON:  05/13/2017 FINDINGS: Brain: No acute territorial infarction, hemorrhage or intracranial mass. Moderate atrophy. Mild small vessel ischemic changes of the white matter. Stable ventricle size. Vascular: No hyperdense vessels. Vertebral artery and carotid vascular calcification Skull: Normal. Negative for fracture or focal lesion. Sinuses/Orbits: No acute finding. Other: None IMPRESSION: 1. No CT evidence for acute intracranial abnormality. 2. Atrophy and small vessel ischemic changes of the white matter. Electronically Signed   By: Donavan Foil M.D.   On: 05/22/2018 02:04    EKG: Independently reviewed. NSR at 73bpm.  Assessment/Plan Principal Problem:   Acute loss of vision, left Active Problems:   Hypertension   CAD, CABG X 3 10/09. Myoview low risk 10/10. EF >55% 2D 6/13.   CKD  (chronic kidney disease), stage III (HCC)   GERD (gastroesophageal reflux disease)   SSS (sick sinus syndrome) (HCC)   Hypothyroidism    1. Acute left-sided monocular vision loss.  Differential currently includes temporal arteritis, retinal detachment, anterior ischemic optic neuropathy, and possible CVA.  Patient will require transfer to [facility] for detailed ocular examination by ophthalmology as recommended by tele-neurology.  Could consider further stroke work-up if ocular exam is nonrevealing.  Additionally, will order bilateral carotid ultrasounds as well as ESR and CRP.  We will keep n.p.o. except medications for now. 2. Hypertension.  Maintain on metoprolol with hydralazine as needed. 3. Hypothyroidism.  Maintain on levothyroxine. 4. CAD.  Maintain on aspirin 81 mg as well as metoprolol. 5. GERD.  PPI. 6. CKD stage III.  Stable.  Continue on Lasix as well as potassium supplementation.   DVT prophylaxis: SCDs Code Status: DNR Family Communication: Daughter at bedside Disposition Plan:Transfer to [facility] for ophthalmology evaluation Consults called: EDP has spoken with tele-neurology; will need ophthalmology consultation in a.m. Admission status: Obs, Med-Surg   Werner Labella Darleen Crocker DO [facility] Pager [phone]  If 7PM-7AM, please contact night-coverage [email or web address] Password [facility]  05/22/2018, 3:14 AM

## 2018-05-22 NOTE — Progress Notes (Signed)
  Echocardiogram 2D Echocardiogram has been performed.  Jennette Dubin 05/22/2018, 2:21 PM

## 2018-05-22 NOTE — Progress Notes (Signed)
Preliminary notes--Bilateral carotid duplex exam completed.  Left ICA 40-59% stenosis.  Antegrade vertebral flow bilaterally.  Vessel tortuosity noted.  Amy [name] (RDMS RVT) 05/22/18 1:55 PM

## 2018-05-22 NOTE — ED Triage Notes (Signed)
Pt states she woke up around 11pm tonight and states it felt like she had something in her eye; pt states now she is unable to see at the bottom of her eye, only the top

## 2018-05-22 NOTE — Progress Notes (Signed)
Patient arrived to 6n09 A&Ox4, VSS, RAC IV intact and saline locked, patient denies pain at this time.  Feeling nauseated from travel from [facility].  No family at bedside.  Patient oriented to room and equipment.  Will continue to monitor.

## 2018-05-22 NOTE — Progress Notes (Signed)
PROGRESS NOTE    Amy [name]  YIA:[id number] DOB: Mar 05, 1922 DOA: 05/22/2018 PCP: Celene Squibb, MD      Brief Narrative:  Amy [name] is a 84 y.o. F with SSS with pacer, CAD s/p CABG, hx Post-op CABG Afib no longer on AC, CKD III-IV baseline Cr 1.4-1.6, HTN, hypothyroidism who presents with acute painless monocular vision loss.  Woke at 11PM on Monday night with "film" over left eye.  While in ER, tele-neuro saw, deemed her not a tPA candidate, was suscpicious for anterior ischemic optic neuropathy or CRAO.  [place] in the ER showed 50-70% plaque on left carotid.   Assessment & Plan:  Monocular vision loss Case has been discussed with Optho, Dr. Cher Nakai who recommends outpatient follow up.  I suspect CRAO.  Carotids with <70% stenosis, but possible source.  Doubt GCA, ESR negative.  Takes baby asprin at home.  Not on [facility] for her pAF due to "hx of serious falls" per last EP note. -Will obtain MRI brain to rule out other infarcts/emboli. -Will obtain Echo to eval for cardioembolic source -Neuro are consulted, appreciate expert guidance -Continue aspirin for now  Paroxysmal atrial fibrillation SSS with pacer CHA2DS2-Vasc 6-8 (this would be first TIA/stroke).  Last EP note states that she has low AF burden by pacer, not on AC due to history of serious falls.  CAD Hypertension Chronic diastolic CHF Appears euvolemic -Continue furosemide -Continue aspirin -Continue metoprolol   CKD III-IV Stable at baseline  Hypothyroidism -Continue levothyroxine  COPD  No active disease -Continue inhalers -Continue famotidine      Family Communication: Daughter at bedside MDM and disposition Plan: This is a no charge note.  For further details, please see H&P by my partner Dr. Manuella Ghazi from earlier today.  The below labs and imaging reports were reviewed and summarized above.    The patient was admitted with amaurosis fugax, suspect CRAO from untreatd Afib.    Objective: Vitals:   05/22/18 0500 05/22/18 0600 05/22/18 0932 05/22/18 1133  BP: (!) 149/60 (!) 146/85 (!) 159/76   Pulse: (!) 59 69 77   Resp:  18 18   Temp:   98 F (36.7 C)   TempSrc:   Oral   SpO2: 93% 91% 96% 95%  Weight:      Height:        Intake/Output Summary (Last 24 hours) at 05/22/2018 1139 Last data filed at 05/22/2018 1126 Gross per 24 hour  Intake -  Output 100 ml  Net -100 ml   Filed Weights   05/22/18 0108  Weight: 79.4 kg (175 lb)    Examination: The patient was seen and examined.      Data Reviewed: I have personally reviewed following labs and imaging studies:  CBC: Recent Labs  Lab 05/22/18 0124  WBC 7.5  NEUTROABS 4.4  HGB 12.9  HCT 38.7  MCV 92.4  PLT 707   Basic Metabolic Panel: Recent Labs  Lab 05/22/18 0124  NA 137  K 4.0  CL 98  CO2 29  GLUCOSE 109*  BUN 30*  CREATININE 1.63*  CALCIUM 9.4   GFR: Estimated Creatinine Clearance: 22.2 mL/min (A) (by C-G formula based on SCr of 1.63 mg/dL (H)). Liver Function Tests: Recent Labs  Lab 05/22/18 0124  AST 23  ALT 16  ALKPHOS 87  BILITOT 0.5  PROT 7.1  ALBUMIN 3.8   No results for input(s): LIPASE, AMYLASE in the last 168 hours. No results for input(s): AMMONIA in the last  168 hours. Coagulation Profile: Recent Labs  Lab 05/22/18 0124  INR 0.91   Cardiac Enzymes: No results for input(s): CKTOTAL, CKMB, CKMBINDEX, TROPONINI in the last 168 hours. BNP (last 3 results) No results for input(s): PROBNP in the last 8760 hours. HbA1C: No results for input(s): HGBA1C in the last 72 hours. CBG: No results for input(s): GLUCAP in the last 168 hours. Lipid Profile: No results for input(s): CHOL, HDL, LDLCALC, TRIG, CHOLHDL, LDLDIRECT in the last 72 hours. Thyroid Function Tests: No results for input(s): TSH, T4TOTAL, FREET4, T3FREE, THYROIDAB in the last 72 hours. Anemia Panel: No results for input(s): VITAMINB12, FOLATE, FERRITIN, TIBC, IRON, RETICCTPCT in the last 72 hours. Urine  analysis:    Component Value Date/Time   COLORURINE STRAW (A) 05/22/2018 0124   APPEARANCEUR CLEAR 05/22/2018 0124   LABSPEC 1.010 05/22/2018 0124   PHURINE 7.0 05/22/2018 0124   GLUCOSEU NEGATIVE 05/22/2018 0124   HGBUR NEGATIVE 05/22/2018 0124   BILIRUBINUR NEGATIVE 05/22/2018 0124   KETONESUR NEGATIVE 05/22/2018 0124   PROTEINUR NEGATIVE 05/22/2018 0124   UROBILINOGEN 0.2 07/31/2014 1641   NITRITE NEGATIVE 05/22/2018 0124   LEUKOCYTESUR NEGATIVE 05/22/2018 0124   Sepsis Labs: @LABRCNTIP (procalcitonin:4,lacticacidven:4)  )No results found for this or any previous visit (from the past 240 hour(s)).       Radiology Studies: Ct Head Wo Contrast  Result Date: 05/22/2018 CLINICAL DATA:  Visual loss, sudden loss of vision in the left eye EXAM: CT HEAD WITHOUT CONTRAST TECHNIQUE: Contiguous axial images were obtained from the base of the skull through the vertex without intravenous contrast. COMPARISON:  05/13/2017 FINDINGS: Brain: No acute territorial infarction, hemorrhage or intracranial mass. Moderate atrophy. Mild small vessel ischemic changes of the white matter. Stable ventricle size. Vascular: No hyperdense vessels. Vertebral artery and carotid vascular calcification Skull: Normal. Negative for fracture or focal lesion. Sinuses/Orbits: No acute finding. Other: None IMPRESSION: 1. No CT evidence for acute intracranial abnormality. 2. Atrophy and small vessel ischemic changes of the white matter. Electronically Signed   By: Donavan Foil M.D.   On: 05/22/2018 02:04   US Carotid Bilateral  Result Date: 05/22/2018 CLINICAL DATA:  Vision loss of the left eye. History of hypertension, stroke/TIA, CAD (post myocardial infarction) and hypertension. EXAM: BILATERAL CAROTID DUPLEX ULTRASOUND TECHNIQUE: Pearline Cables scale imaging, color Doppler and duplex ultrasound were performed of bilateral carotid and vertebral arteries in the neck. COMPARISON:  None. FINDINGS: Examination is degraded due to  patient body habitus and poor sonographic window Criteria: Quantification of carotid stenosis is based on velocity parameters that correlate the residual internal carotid diameter with NASCET-based stenosis levels, using the diameter of the distal internal carotid lumen as the denominator for stenosis measurement. The following velocity measurements were obtained: RIGHT ICA:  88/23 cm/sec CCA:  06/2 cm/sec SYSTOLIC ICA/CCA RATIO:  1.3 ECA:  115 cm/sec LEFT ICA:  146/35 cm/sec CCA:  37/6 cm/sec SYSTOLIC ICA/CCA RATIO:  2.5 ECA:  71 cm/sec RIGHT CAROTID ARTERY: There is mild tortuosity of the right common carotid artery. There is a minimal amount of echogenic plaque involving the mid aspect the right common carotid artery (image 7). There is a large amount of echogenic plaque within the right carotid bulb (image 16), not resulting in elevated peak systolic velocities within the interrogated course of the right internal carotid artery to suggest a hemodynamically significant stenosis. RIGHT VERTEBRAL ARTERY:  Antegrade flow LEFT CAROTID ARTERY: There is a minimal to moderate amount of echogenic plaque involving the mid and distal aspects of  the left common carotid artery (image 45). There is a large amount of echogenic plaque within the left carotid bulb (images 48 and 50), extending to involve the origin and proximal aspects of the left internal carotid artery (image 58), resulting in elevated peak systolic velocities within the proximal and mid aspects of the left internal carotid artery. Greatest acquired peak systolic velocity within the proximal left ICA measures 146 centimeters/second (image 60). LEFT VERTEBRAL ARTERY:  Antegrade Flow IMPRESSION: 1. Large amount of left-sided atherosclerotic plaque results in elevated peak systolic velocities within the left internal carotid artery compatible with the 50-69% luminal narrowing range. Further evaluation with CTA could be performed as clinically indicated. 2.  Moderate to large amount of right-sided atherosclerotic plaque, not definitely resulting in a hemodynamically significant stenosis. Electronically Signed   By: Sandi Mariscal M.D.   On: 05/22/2018 08:39        Scheduled Meds: . acidophilus  1 capsule Oral Daily  . aspirin EC  81 mg Oral Daily  . cholecalciferol  1,000 Units Oral Daily  . docusate sodium  100 mg Oral BID  . famotidine  20 mg Oral QHS  . fluticasone furoate-vilanterol  1 puff Inhalation Daily   And  . umeclidinium bromide  1 puff Inhalation Daily  . furosemide  40 mg Oral Daily  . levothyroxine  50 mcg Oral QAC breakfast  . metoprolol succinate  25 mg Oral Daily  . pantoprazole  40 mg Oral Daily  . potassium chloride  10 mEq Oral Daily  . sodium chloride flush  3 mL Intravenous Q12H   Continuous Infusions: . sodium chloride       LOS: 0 days    Time spent: 25 minuts    Edwin Dada, MD [facility] 05/22/2018, 11:39 AM     Pager [phone] --- please page though AMION:  [email or web address] Password [email or web address] If 7PM-7AM, please contact night-coverage

## 2018-05-22 NOTE — Progress Notes (Signed)
Nurse called [employer] and requested a representative to interrogate the monitor per MD request.  Will continue to monitor.

## 2018-05-23 ENCOUNTER — Other Ambulatory Visit: Payer: Self-pay | Admitting: Cardiovascular Disease

## 2018-05-23 DIAGNOSIS — I2510 Atherosclerotic heart disease of native coronary artery without angina pectoris: Secondary | ICD-10-CM | POA: Diagnosis not present

## 2018-05-23 DIAGNOSIS — E785 Hyperlipidemia, unspecified: Secondary | ICD-10-CM | POA: Diagnosis present

## 2018-05-23 DIAGNOSIS — I951 Orthostatic hypotension: Secondary | ICD-10-CM | POA: Diagnosis present

## 2018-05-23 DIAGNOSIS — I6522 Occlusion and stenosis of left carotid artery: Secondary | ICD-10-CM | POA: Diagnosis not present

## 2018-05-23 DIAGNOSIS — I672 Cerebral atherosclerosis: Secondary | ICD-10-CM | POA: Diagnosis present

## 2018-05-23 DIAGNOSIS — Z66 Do not resuscitate: Secondary | ICD-10-CM | POA: Diagnosis present

## 2018-05-23 DIAGNOSIS — I5032 Chronic diastolic (congestive) heart failure: Secondary | ICD-10-CM | POA: Diagnosis present

## 2018-05-23 DIAGNOSIS — H3412 Central retinal artery occlusion, left eye: Secondary | ICD-10-CM | POA: Diagnosis present

## 2018-05-23 DIAGNOSIS — H5462 Unqualified visual loss, left eye, normal vision right eye: Secondary | ICD-10-CM | POA: Diagnosis present

## 2018-05-23 DIAGNOSIS — N183 Chronic kidney disease, stage 3 (moderate): Secondary | ICD-10-CM | POA: Diagnosis not present

## 2018-05-23 DIAGNOSIS — K219 Gastro-esophageal reflux disease without esophagitis: Secondary | ICD-10-CM | POA: Diagnosis not present

## 2018-05-23 DIAGNOSIS — Z96642 Presence of left artificial hip joint: Secondary | ICD-10-CM | POA: Diagnosis present

## 2018-05-23 DIAGNOSIS — I4892 Unspecified atrial flutter: Secondary | ICD-10-CM | POA: Diagnosis present

## 2018-05-23 DIAGNOSIS — I480 Paroxysmal atrial fibrillation: Secondary | ICD-10-CM | POA: Diagnosis present

## 2018-05-23 DIAGNOSIS — I700 Atherosclerosis of aorta: Secondary | ICD-10-CM | POA: Diagnosis present

## 2018-05-23 DIAGNOSIS — I739 Peripheral vascular disease, unspecified: Secondary | ICD-10-CM | POA: Diagnosis present

## 2018-05-23 DIAGNOSIS — E039 Hypothyroidism, unspecified: Secondary | ICD-10-CM

## 2018-05-23 DIAGNOSIS — J449 Chronic obstructive pulmonary disease, unspecified: Secondary | ICD-10-CM | POA: Diagnosis present

## 2018-05-23 DIAGNOSIS — N184 Chronic kidney disease, stage 4 (severe): Secondary | ICD-10-CM | POA: Diagnosis present

## 2018-05-23 DIAGNOSIS — I495 Sick sinus syndrome: Secondary | ICD-10-CM

## 2018-05-23 DIAGNOSIS — R29701 NIHSS score 1: Secondary | ICD-10-CM | POA: Diagnosis present

## 2018-05-23 DIAGNOSIS — I4891 Unspecified atrial fibrillation: Secondary | ICD-10-CM | POA: Diagnosis not present

## 2018-05-23 DIAGNOSIS — M1990 Unspecified osteoarthritis, unspecified site: Secondary | ICD-10-CM | POA: Diagnosis present

## 2018-05-23 DIAGNOSIS — H47012 Ischemic optic neuropathy, left eye: Secondary | ICD-10-CM | POA: Diagnosis present

## 2018-05-23 DIAGNOSIS — F419 Anxiety disorder, unspecified: Secondary | ICD-10-CM | POA: Diagnosis present

## 2018-05-23 DIAGNOSIS — H53132 Sudden visual loss, left eye: Secondary | ICD-10-CM | POA: Diagnosis not present

## 2018-05-23 DIAGNOSIS — I130 Hypertensive heart and chronic kidney disease with heart failure and stage 1 through stage 4 chronic kidney disease, or unspecified chronic kidney disease: Secondary | ICD-10-CM | POA: Diagnosis present

## 2018-05-23 DIAGNOSIS — I10 Essential (primary) hypertension: Secondary | ICD-10-CM | POA: Diagnosis not present

## 2018-05-23 LAB — LIPID PANEL
CHOLESTEROL: 363 mg/dL — AB (ref 0–200)
HDL: 61 mg/dL (ref >40)
LDL Cholesterol: 257 mg/dL — ABNORMAL HIGH (ref 0–99)
Total CHOL/HDL Ratio: 6 RATIO
Triglycerides: 227 mg/dL — ABNORMAL HIGH (ref <150)
VLDL: 45 mg/dL — ABNORMAL HIGH (ref 0–40)

## 2018-05-23 LAB — HEMOGLOBIN A1C
Hgb A1c MFr Bld: 6 % — ABNORMAL HIGH (ref 4.8–5.6)
MEAN PLASMA GLUCOSE: 125.5 mg/dL

## 2018-05-23 MED ORDER — CLOPIDOGREL BISULFATE 75 MG PO TABS
75.0000 mg | ORAL_TABLET | Freq: Every day | ORAL | Status: DC
  Administered 2018-05-24: 75 mg via ORAL
  Filled 2018-05-23: qty 1

## 2018-05-23 MED ORDER — ATORVASTATIN CALCIUM 10 MG PO TABS: 10.0000 mg | ORAL_TABLET | Freq: Every day | ORAL | Status: DC

## 2018-05-23 MED ORDER — ATORVASTATIN CALCIUM 20 MG PO TABS: 20.0000 mg | ORAL_TABLET | Freq: Every day | ORAL | Status: DC

## 2018-05-23 MED ORDER — SODIUM CHLORIDE 0.9 % IV SOLN
INTRAVENOUS | Status: DC
Start: 2018-05-23 — End: 2018-05-24
  Administered 2018-05-23: 14:00:00 via INTRAVENOUS

## 2018-05-23 NOTE — Progress Notes (Signed)
Pt tried to have a bowel movement and bared down, became lightheaded when trying to walk back to bed. Made it safely to bed and felt better after lying for a few minutes.  Said she had passed out in past doing the same thing.

## 2018-05-23 NOTE — Plan of Care (Signed)
  Problem: Clinical Measurements: Goal: Will remain free from infection Outcome: Progressing Goal: Respiratory complications will improve Outcome: Progressing Goal: Cardiovascular complication will be avoided Outcome: Progressing   Problem: Activity: Goal: Risk for activity intolerance will decrease Outcome: Progressing   

## 2018-05-23 NOTE — Progress Notes (Signed)
PROGRESS NOTE  Amy [name] OHY:[id number] DOB: 10/19/1922 DOA: 05/22/2018 PCP: Celene Squibb, MD  HPI/Recap of past 24 hours: Amy [name] is a 84 y.o. F with medical history significant for SSS with pacer, CAD s/p CABG, Afib no longer on AC, CKD III-IV baseline Cr 1.4-1.6, HTN, hypothyroidism who presents with acute painless monocular vision loss described as a film over L eye. In ER, tele-neuro deemed her not a tPA candidate, was suscpicious for anterior ischemic optic neuropathy or CRAO.  [place] in the ER showed 50-70% plaque on left carotid.  Patient had a fundoscopy exam in the ED which showed redness and blurring of the macula, no obvious vitreous hemorrhage no diabetes in the anterior chamber, pressure left eye was 14 and pressure right eye was 13. Pt admitted for further management.  Today, patient reported still having blurry vision in her left eye, sees different types of colors at different times.  Denies any headaches, no focal neurologic deficit noted, no chest pain, no abdominal pain, no fever/chills.  Patient was noted to have an episode of orthostatic hypotension during PT session.    Assessment/Plan: Principal Problem:   Acute loss of vision, left Active Problems:   Hypertension   CAD, CABG X 3 10/09. Myoview low risk 10/10. EF >55% 2D 6/13.   CKD (chronic kidney disease), stage III (HCC)   GERD (gastroesophageal reflux disease)   SSS (sick sinus syndrome) (HCC)   Hypothyroidism   Vision loss of left eye  Monocular vision loss likely due to L CRAO in the setting of LICA stenosis History of A. fib not on any AC due to risk of falls/bleeds Carotid Doppler showed L ICA 40 to 59% CT/MRI head unremarkable MRA showed multiple anterior and posterior severe stenosis due to atherosclerosis.  3 anterior circulation aneurysms Echo showed EF of 60 to 65%, no source of embolus Neurology on board, recommend aspirin and Plavix for 3 weeks followed by aspirin alone due to high risk of  bleeding and patient's age VVS consulted for possible LICA intervention, patient not interested in pursuing any invasive procedure Ophthalmology consulted, recommend outpatient follow-up PT/OT on board Fall precautions Telemetry, frequent neuro checks  Paroxysmal A. Fib, SSS with pacemaker CHA2DS2-Vasc 6 Last EP note states that she has low AF burden by pacer, not on AC due to history of serious falls Continue metoprolol  Orthostatic hypotension Noted during PT session Held Lasix Started very gentle hydration for 1 L  Hypertension Stable  CAD/chronic diastolic HF Echo as above, normal EF Held Lasix due to episode of orthostatic hypotension Continue metoprolol  HLD LDL 257 Start low dose Lipitor 10 mg due to previous adverse effects of muscle ache  CKD stage IV Creatinine at baseline Daily BMP  Hypothyroidism Continue Synthroid  COPD Stable Continue inhalers    Code Status: DNR  Family Communication: None at bedside  Disposition Plan: Plan for discharge on 05/24/2018   Consultants:  Neurology  Vascular surgery  Procedures:  None  Antimicrobials:  None  DVT prophylaxis: SCDs   Objective: Vitals:   05/23/18 0524 05/23/18 0626 05/23/18 0758 05/23/18 1353  BP: (!) 124/55 (!) 122/57  (!) 168/58  Pulse: 63 (!) 59  62  Resp: 15 13    Temp: 98 F (36.7 C) (!) 97.5 F (36.4 C)  97.7 F (36.5 C)  TempSrc: Oral Oral  Oral  SpO2: 92% 91% 94% 96%  Weight:      Height:        Intake/Output  Summary (Last 24 hours) at 05/23/2018 1712 Last data filed at 05/23/2018 1447 Gross per 24 hour  Intake 247.4 ml  Output 500 ml  Net -252.6 ml   Filed Weights   05/22/18 0108  Weight: 79.4 kg (175 lb)    Exam:   General: NAD  Cardiovascular: S1-S2 present  Respiratory: CTA B  Abdomen: Soft, nontender, nondistended, bowel sounds present  Musculoskeletal: No pedal edema noted  Skin: Normal  Psychiatry: Normal mood   Data  Reviewed: CBC: Recent Labs  Lab 05/22/18 0124  WBC 7.5  NEUTROABS 4.4  HGB 12.9  HCT 38.7  MCV 92.4  PLT 235   Basic Metabolic Panel: Recent Labs  Lab 05/22/18 0124  NA 137  K 4.0  CL 98  CO2 29  GLUCOSE 109*  BUN 30*  CREATININE 1.63*  CALCIUM 9.4   GFR: Estimated Creatinine Clearance: 22.2 mL/min (A) (by C-G formula based on SCr of 1.63 mg/dL (H)). Liver Function Tests: Recent Labs  Lab 05/22/18 0124  AST 23  ALT 16  ALKPHOS 87  BILITOT 0.5  PROT 7.1  ALBUMIN 3.8   No results for input(s): LIPASE, AMYLASE in the last 168 hours. No results for input(s): AMMONIA in the last 168 hours. Coagulation Profile: Recent Labs  Lab 05/22/18 0124  INR 0.91   Cardiac Enzymes: No results for input(s): CKTOTAL, CKMB, CKMBINDEX, TROPONINI in the last 168 hours. BNP (last 3 results) No results for input(s): PROBNP in the last 8760 hours. HbA1C: Recent Labs    05/23/18 0856  HGBA1C 6.0*   CBG: No results for input(s): GLUCAP in the last 168 hours. Lipid Profile: Recent Labs    05/23/18 0856  CHOL 363*  HDL 61  LDLCALC 257*  TRIG 227*  CHOLHDL 6.0   Thyroid Function Tests: No results for input(s): TSH, T4TOTAL, FREET4, T3FREE, THYROIDAB in the last 72 hours. Anemia Panel: No results for input(s): VITAMINB12, FOLATE, FERRITIN, TIBC, IRON, RETICCTPCT in the last 72 hours. Urine analysis:    Component Value Date/Time   COLORURINE STRAW (A) 05/22/2018 0124   APPEARANCEUR CLEAR 05/22/2018 0124   LABSPEC 1.010 05/22/2018 0124   PHURINE 7.0 05/22/2018 0124   GLUCOSEU NEGATIVE 05/22/2018 0124   HGBUR NEGATIVE 05/22/2018 0124   BILIRUBINUR NEGATIVE 05/22/2018 0124   KETONESUR NEGATIVE 05/22/2018 0124   PROTEINUR NEGATIVE 05/22/2018 0124   UROBILINOGEN 0.2 07/31/2014 1641   NITRITE NEGATIVE 05/22/2018 0124   LEUKOCYTESUR NEGATIVE 05/22/2018 0124   Sepsis Labs: @LABRCNTIP (procalcitonin:4,lacticidven:4)  )No results found for this or any previous visit  (from the past 240 hour(s)).    Studies: No results found.  Scheduled Meds: . acidophilus  1 capsule Oral Daily  . aspirin EC  81 mg Oral Daily  . cholecalciferol  1,000 Units Oral Daily  . clopidogrel  75 mg Oral Daily  . docusate sodium  100 mg Oral BID  . famotidine  20 mg Oral QHS  . fluticasone furoate-vilanterol  1 puff Inhalation Daily   And  . umeclidinium bromide  1 puff Inhalation Daily  . levothyroxine  50 mcg Oral QAC breakfast  . metoprolol succinate  25 mg Oral Daily  . pantoprazole  40 mg Oral Daily  . potassium chloride  10 mEq Oral Daily  . sodium chloride flush  3 mL Intravenous Q12H    Continuous Infusions: . sodium chloride    . sodium chloride Stopped (05/23/18 1437)     LOS: 0 days     Alma Friendly, MD [facility]   If 7PM-7AM, please contact night-coverage [email or web address] Password [facility] 05/23/2018, 5:12 PM

## 2018-05-23 NOTE — Care Management (Addendum)
    Durable Medical Equipment  (From admission, onward)        Start     Ordered   05/23/18 1347  For home use only DME lightweight manual wheelchair with seat cushion  Once    Comments:  Patient suffers from vision loss weakness and orthostasis which impairs their ability to perform daily activities like walking in the home.  A walker will not resolve  issue with performing activities of daily living. A wheelchair will allow patient to safely perform daily activities. Patient is not able to propel themselves in the home using a standard weight wheelchair due to vision loss weakness and orthostasis. Patient can self propel in the lightweight wheelchair.  Accessories: elevating leg rests (ELRs), wheel locks, extensions and anti-tippers.   05/23/18 1347

## 2018-05-23 NOTE — Care Management Obs Status (Signed)
[place] NOTIFICATION   Patient Details  Name: Amy [name] MRN: [id number] Date of Birth: 1922-08-04   Medicare Observation Status Notification Given:  Yes    Carles Collet, RN 05/23/2018, 1:33 PM

## 2018-05-23 NOTE — Care Management Note (Signed)
Case Management Note  Patient Details  Name: Amy [name] MRN: [id number] Date of Birth: February 21, 1922  Subjective/Objective:                 Spoke w patient at the bedside. She would like to use [facility] for [facility] services. She would also like lightweight wheelchair as suggested by PT. She states that she lives with her daughter and son in law, she states that she has 24 hour supervision.   Action/Plan: Referral placed to [facility] for [facility] PT OT (needs orders). Referral placed for WC.    Expected Discharge Date:                  Expected Discharge Plan:  [place]  In-House Referral:     Discharge planning Services  CM Consult  Post Acute Care Choice:  Home Health, Durable Medical Equipment Choice offered to:  Patient  DME Arranged:  Youth worker wheelchair with seat cushion DME Agency:  [place]:  PT, OT [facility] Agency:  [place]  Status of Service:  In process, will continue to follow  If discussed at Long Length of Stay Meetings, dates discussed:    Additional Comments:  Carles Collet, RN 05/23/2018, 1:54 PM

## 2018-05-23 NOTE — Consult Note (Addendum)
Hospital Consult   Reason for Consult:  L eye vision changes Requesting Physician:  Dr. Leonie Man MRN #:  [id number]  History of Present Illness: This is a 84 y.o. female with HTN, hyperlipidemia, CAD, A fib not anticoagulated, CKD stage III-IV, and carotid artery disease seen in consultation due to L eye vision changes and central vision loss.  Carotid duplex demonstrates L ICA 40-59% stenosis with patent and antegrade vertebral arteries bilaterally.  MRA demonstrating several areas of severe stenosis in anterior and posterior circulation.  Patient describes vision changes starting Monday.  Areas of vision loss, color changes, and flashing lights have been changing since that time.  She denies any R sided weakness, numbness, or slurring speech.   She has an allergy to statin.  She is on aspirin 81mg  daily.  She denies smoking history.  Past Medical History:  Diagnosis Date  . Anxiety   . Arthritis   . Asthma   . CAD in native artery    s/p CABG x 3; Last Myoview in 07/2009 - non-ischemic; Echo 03/2012  Aortic Sclerosis with normal EF.  . CKD (chronic kidney disease), stage III ([place])   . DVT (deep vein thrombosis) in pregnancy ([place])   . Esophageal perforation    9/13  . Fall 07/21/2014   FALL WITH INJURY  . Fracture of head of humerus   . Fracture of hip ([place]) 07/21/2014   LEFT  . GERD (gastroesophageal reflux disease)   . Hypercholesteremia   . Hypertension   . Hypothyroidism   . Loss of vision 05/22/2018   LEFT EYE  . Mediastinitis    s/p drainage  . Myocardial infarction [facility])    Inferior STEMI 07/2008 - PCI of prox RCA followed byu CABG x 3 in 9/'09  . Pacemaker   . PAF (paroxysmal atrial fibrillation) ([place])   . Paroxysmal atrial flutter ([place]) 11/26/2014  . Pneumonia   . Shortness of breath   . SSS (sick sinus syndrome) ([place]) 11/26/2014  . Status post hip hemiarthroplasty left hip by Dr Ronnie Derby 08/08/2012    Past Surgical History:  Procedure Laterality Date  . CARPAL TUNNEL  RELEASE  08/08/2012   Procedure: CARPAL TUNNEL RELEASE;  Surgeon: Schuyler Amor, MD;  Location: [place];  Service: Orthopedics;  Laterality: Left;  . CORONARY ARTERY BYPASS GRAFT  2009   LIMA-LAD, SVG-RI, SVG-stented RCA  . ESOPHAGOGASTRODUODENOSCOPY  08/10/2012   Procedure: ESOPHAGOGASTRODUODENOSCOPY (EGD);  Surgeon: Beryle Beams, MD;  Location: [facility] ENDOSCOPY;  Service: Endoscopy;  Laterality: N/A;  . ESOPHAGOSCOPY  08/10/2012   Procedure: ESOPHAGOSCOPY;  Surgeon: [name], MD;  Location: [place];  Service: ENT;  Laterality: N/A;  . GASTROSTOMY  08/16/2012   Procedure: GASTROSTOMY;  Surgeon: Zenovia Jarred, MD;  Location: [place];  Service: General;  Laterality: N/A;  . HIP ARTHROPLASTY  08/08/2012   Procedure: ARTHROPLASTY BIPOLAR HIP;  Surgeon: Rudean Haskell, MD;  Location: [place];  Service: Orthopedics;  Laterality: Left;  [place]   . JOINT REPLACEMENT    . LEFT HEART CATHETERIZATION WITH CORONARY ANGIOGRAM N/A 04/16/2014   Procedure: LEFT HEART CATHETERIZATION WITH CORONARY ANGIOGRAM;  Surgeon: Wellington Hampshire, MD;  Location: [place] CATH LAB;  Service: Cardiovascular;  Laterality: N/A;  . NM MYOCAR PERF WALL MOTION  08/14/2009   Normal  . PACEMAKER INSERTION  01/06/2009   [employer]  . PPM GENERATOR CHANGEOUT N/A 01/24/2018   Procedure: PPM GENERATOR CHANGEOUT;  Surgeon: Sanda Klein, MD;  Location: [place] CV LAB;  Service: Cardiovascular;  Laterality: N/A;  . TOTAL HIP ARTHROPLASTY    . WRIST FRACTURE SURGERY  07/2012   left intra articular  with carpel tunnel    Allergies  Allergen Reactions  . Colestipol Other (See Comments)    Muscle aches  . Hydrocodone-Acetaminophen Itching and Other (See Comments)    Tolerates tylenol  . Niacin And Related Other (See Comments)    Unknown  . Statins Other (See Comments)    Muscle aches on all she has tried.  She recalls Lipitor, Crestor, and Livalo but thinks there were others  . Zetia [Ezetimibe] Other (See Comments)    Muscle  aches    Prior to Admission medications   Medication Sig Start Date End Date Taking? Authorizing Provider  acetaminophen (TYLENOL) 500 MG tablet Take 1,000 mg by mouth every 6 (six) hours as needed for moderate pain.   Yes [provider]  albuterol (PROVENTIL HFA;VENTOLIN HFA) 108 (90 Base) MCG/ACT inhaler Inhale 2 puffs into the lungs every 6 (six) hours as needed for wheezing or shortness of breath.    Yes [provider]  allopurinol (ZYLOPRIM) 100 MG tablet Take 100 mg by mouth daily. 05/10/18  Yes [provider]  aspirin EC 81 MG tablet Take 81 mg by mouth daily.   Yes [provider]  Cholecalciferol (VITAMIN D3 PO) Take 1 tablet by mouth daily.   Yes [provider]  cycloSPORINE (RESTASIS) 0.05 % ophthalmic emulsion Place 1 drop into both eyes 2 (two) times daily.   Yes [provider]  docusate sodium (COLACE) 100 MG capsule Take 100 mg by mouth 2 (two) times daily.   Yes [provider]  Fluticasone-Umeclidin-Vilant (TRELEGY ELLIPTA) 100-62.5-25 MCG/INH AEPB Inhale 1 puff into the lungs daily.    Yes [provider]  furosemide (LASIX) 40 MG tablet TAKE 1 TABLET BY MOUTH ONCE DAILY AS DIRECTED 02/26/18  Yes Croitoru, Mihai, MD  Lactobacillus-Inulin (CULTURELLE DIGESTIVE HEALTH PO) Take 1 capsule by mouth daily.    Yes [provider]  levothyroxine (SYNTHROID, LEVOTHROID) 50 MCG tablet Take 50 mcg by mouth daily before breakfast.  06/20/16  Yes [provider]  LORazepam (ATIVAN) 1 MG tablet Take 1 mg by mouth at bedtime as needed for anxiety or sleep.    Yes [provider]  metoprolol succinate (TOPROL XL) 25 MG 24 hr tablet Take 1 tablet (25 mg total) by mouth daily. 02/14/18  Yes Croitoru, Mihai, MD  Misc Natural Products (TART CHERRY ADVANCED PO) Take 1 tablet by mouth daily.   Yes [provider]  nitroGLYCERIN (NITROSTAT) 0.4 MG SL tablet Place 1 tablet (0.4 mg total) under  the tongue every 5 (five) minutes x 3 doses as needed for chest pain. 04/17/14  Yes Barrett, Evelene Croon, PA-C  omeprazole (PRILOSEC) 40 MG capsule TAKE ONE CAPSULE BY MOUTH ONCE DAILY Patient taking differently: TAKE 40 MG BY MOUTH ONCE DAILY 07/25/17  Yes Setzer, Terri L, NP  potassium chloride (K-DUR) 10 MEQ tablet Take 1 tablet (10 mEq total) by mouth daily as directed. Patient taking differently: Take 10 mEq by mouth daily.  12/26/17  Yes Croitoru, Mihai, MD  ranitidine (ZANTAC) 150 MG tablet TAKE ONE TABLET BY MOUTH ONCE DAILY AT BEDTIME Patient taking differently: TAKE 150 MG BY MOUTH ONCE DAILY AT BEDTIME 08/21/17  Yes Setzer, Terri L, NP  Wheat Dextrin (BENEFIBER DRINK MIX PO) Take 1 Dose by mouth 2 (two) times daily as needed (constipation).   Yes [provider]  methylPREDNISolone (MEDROL DOSEPAK) 4 MG TBPK tablet Use per pack directions Patient not taking: Reported on 05/22/2018 02/17/18   Davonna Belling, MD    Social History   Socioeconomic History  . Marital status: Widowed    Spouse name: Not on file  . Number of children: Not on file  . Years of education: Not on file  . Highest education level: Not on file  Occupational History  . Not on file  Social Needs  . Financial resource strain: Not on file  . Food insecurity:    Worry: Not on file    Inability: Not on file  . Transportation needs:    Medical: Not on file    Non-medical: Not on file  Tobacco Use  . Smoking status: Never Smoker  . Smokeless tobacco: Never Used  Substance and Sexual Activity  . Alcohol use: No  . Drug use: No  . Sexual activity: Not Currently  Lifestyle  . Physical activity:    Days per week: Not on file    Minutes per session: Not on file  . Stress: Not on file  Relationships  . Social connections:    Talks on phone: Not on file    Gets together: Not on file    Attends religious service: Not on file    Active member of club or organization: Not on file    Attends meetings of  clubs or organizations: Not on file    Relationship status: Not on file  . Intimate partner violence:    Fear of current or ex partner: Not on file    Emotionally abused: Not on file    Physically abused: Not on file    Forced sexual activity: Not on file  Other Topics Concern  . Not on file  Social History Narrative  . Not on file     Family History  Problem Relation Age of Onset  . Stroke Father     ROS: Otherwise negative unless mentioned in HPI  Physical Examination  Vitals:   05/23/18 0626 05/23/18 0758  BP: (!) 122/57   Pulse: (!) 59   Resp: 13   Temp: (!) 97.5 F (36.4 C)   SpO2: 91% 94%   Body mass index is 28.68 kg/m.  General:  WDWN in NAD Gait: Not observed HENT: WNL, normocephalic Pulmonary: normal non-labored breathing Cardiac: regular with ectopy; no apparent carotid bruits Abdomen:  soft, NT/ND, no masses Skin: without rashes Vascular Exam/Pulses: symmetrical radial pulses; symmetrical DP pulses Extremities: without ischemic changes, without Gangrene , without cellulitis; without open wounds;  Musculoskeletal: no muscle wasting or atrophy  Neurologic: A&O X 3;  No focal weakness or paresthesias are detected; speech is fluent/normal; central vision loss L eye; CN otherwise intact Psychiatric:  The pt has Normal affect. Lymph:  Unremarkable  CBC    Component Value Date/Time   WBC 7.5 05/22/2018 0124   RBC 4.19 05/22/2018 0124   HGB 12.9 05/22/2018 0124   HGB 13.2 01/10/2018 1433   HCT 38.7 05/22/2018 0124   HCT 40.4 01/10/2018 1433   PLT 218 05/22/2018 0124   PLT 270 01/10/2018 1433   MCV 92.4 05/22/2018 0124   MCV 90 01/10/2018 1433   MCH 30.8 05/22/2018 0124   MCHC 33.3 05/22/2018 0124   RDW 14.7 05/22/2018 0124   RDW 14.0 01/10/2018 1433   LYMPHSABS 2.2 05/22/2018 0124   MONOABS 0.6 05/22/2018 0124   EOSABS 0.3 05/22/2018 0124   BASOSABS 0.0 05/22/2018 0124  BMET    Component Value Date/Time   NA 137 05/22/2018 0124   NA  141 01/10/2018 1433   K 4.0 05/22/2018 0124   CL 98 05/22/2018 0124   CO2 29 05/22/2018 0124   GLUCOSE 109 (H) 05/22/2018 0124   BUN 30 (H) 05/22/2018 0124   BUN 31 01/10/2018 1433   CREATININE 1.63 (H) 05/22/2018 0124   CREATININE 1.19 (H) 03/05/2015 1157   CALCIUM 9.4 05/22/2018 0124   GFRNONAA 26 (L) 05/22/2018 0124   GFRAA 30 (L) 05/22/2018 0124    COAGS: Lab Results  Component Value Date   INR 0.91 05/22/2018   INR 0.9 01/10/2018   INR 0.88 04/15/2014     Non-Invasive Vascular Imaging:   ANATOMIC VARIANTS: None.  Source images and MIP images were reviewed.  IMPRESSION: MRI HEAD:  1. Negative noncontrast MRI head.  MRA HEAD:  1. No emergent large vessel occlusion. 2. Multifocal severe stenosis anterior and posterior circulation compatible with atherosclerosis. 3. Three aneurysms anterior circulation measuring less than 2 mm.   Carotid duplex IMPRESSION: 1. Large amount of left-sided atherosclerotic plaque results in elevated peak systolic velocities within the left internal carotid artery compatible with the 50-69% luminal narrowing range. Further evaluation with CTA could be performed as clinically indicated. 2. Moderate to large amount of right-sided atherosclerotic plaque, not definitely resulting in a hemodynamically significant stenosis.   Statin:  No. Beta Blocker:  Yes.   Aspirin:  Yes.   ACEI:  No. ARB:  No. CCB use:  No Other antiplatelets/anticoagulants:  No.    ASSESSMENT/PLAN: This is a 84 y.o. female with partial vision loss left eye  L ICA 50-69%, vertebrals patent and antegrade by duplex CKD stage III-IV; Would further workup with CTA head and neck however high risk for contrast induced AKI Patient not a surgical candidate for L CEA; Patient states she is now more accepting of vision loss and would prefer not to pursue invasive treatment at her age Continue aspirin; can potentially be related to nonanticoagulated PAF Dr. Donzetta Matters  to discuss treatment plan with Dr. Audry Riles PA-C [facility] [phone]  I have independently interviewed and examined patient and agree with PA assessment and plan above.  I discussed with the patient the 2 most likely etiologies for her retinal artery occlusion which would include embolic disease from her untreated paroxysmal atrial fibrillation versus from her moderate grade carotid stenosis which appears to be highly calcified.  I discussed with her the 2 options for treating the carotid stenosis which would be endarterectomy versus stenting and she is against surgery stating that she has previously been told she is not a candidate for general anesthesia.  That would leave [place] with stenting but at this time she also does not want to proceed with that.  We will get her to follow-up in the office in 3 months with repeat carotid duplex.  Certainly if she has any further symptoms we may want to reconsider prior.  I would likely get a noncontrasted CT scan to evaluate the amount of calcium prior to deciding on stenting given that a highly calcified lesion but not really be amenable to stenting.  She demonstrates very good understanding of our discussion I will plan to see her in 3 months.  Ryleigh Buenger C. Donzetta Matters, MD [facility] [place] Office: [phone] Pager: [phone]

## 2018-05-23 NOTE — Progress Notes (Addendum)
STROKE TEAM PROGRESS NOTE  HPI:( Dr Lorraine Lax)  Amy [name] is a 84 y.o. female significant past medical history of proximal A. fib but not on Coumadin secondary to fall risk.  She is currently on aspirin.  Patient also has a history of CAD, carotid stenosis, DVT in pregnancy, multiple falls, hypertension, hyperlipidemia(per cardiology patient has severe hyperlipidemia with an LDL cholesterol in the past excessive of 200--most recent LDL that was obtained during hospitalization is 210.  She is also statin, niacin, ezetimibe and resin intolerant), and sick sinus syndrome.  Patient awoke at 11 PM on Monday night with knows that she had lost all vision in her left eye.  She says during this time she was seeing flashing floaters.  Patient waited a good hour before she awoke her family member and at that time they waited till the next morning to come into the emergency room.  They went to the emergency room at [facility] where telemetry neurology had seen the patient and did not deem her TPA candidate.  However there was concerned for central retinal artery occlusion especially since the ultrasound showed a 50-70% plaque on the left carotid artery.while patient was in the emergency room she had a  fundoscopy exam was obtained showing "redness and blurring of the macula, no obvious vitreous hemorrhage, no debris's in the anterior chamber, pressure left eye was 14, pressure right eye was 13." Per cardiology notes patient is not on any anticoagulant--"In 2010 a permanent pacemaker ([employer]) was implanted for symptomatic bradycardia.She underwent a generator change out in March 2019. She has paroxysmal atrial fibrillation and atrial flutter, but she is not taking anticoagulants to fall/bleeding risk. She does take aspirin."  LKW: 2300 hrs. on 05/21/2018 tpa given?: no, symptoms resolving Premorbid modified Rankin scale (mRS): 0 NIH stroke scale of 1   INTERVAL HISTORY Her daughter is at the bedside.  Patient  sitting up in the chair. Shared history of her name.  She recounted HPI. First reports she saw red over black, then could see fine -> then turned blue with black marks -> improved to clouds floating. Now, can see red halfway and the tips of examiners fingers. Can identify some movement. She denies any prior history of TIAs or strokes  Vitals:   05/22/18 2027 05/23/18 0524 05/23/18 0626 05/23/18 0758  BP: (!) 116/57 (!) 124/55 (!) 122/57   Pulse: (!) 59 63 (!) 59   Resp: 16 15 13    Temp: 98.4 F (36.9 C) 98 F (36.7 C) (!) 97.5 F (36.4 C)   TempSrc: Oral Oral Oral   SpO2: 94% 92% 91% 94%  Weight:      Height:        CBC:  Recent Labs  Lab 05/22/18 0124  WBC 7.5  NEUTROABS 4.4  HGB 12.9  HCT 38.7  MCV 92.4  PLT 384    Basic Metabolic Panel:  Recent Labs  Lab 05/22/18 0124  NA 137  K 4.0  CL 98  CO2 29  GLUCOSE 109*  BUN 30*  CREATININE 1.63*  CALCIUM 9.4   Lipid Panel:     Component Value Date/Time   CHOL 293 (H) 04/16/2014 0422   TRIG 178 (H) 04/16/2014 0422   HDL 47 04/16/2014 0422   CHOLHDL 6.2 04/16/2014 0422   VLDL 36 04/16/2014 0422   LDLCALC 210 (H) 04/16/2014 0422   HgbA1c:  Lab Results  Component Value Date   HGBA1C 5.8 (H) 04/15/2014   Urine Drug Screen: No results found  for: LABOPIA, COCAINSCRNUR, LABBENZ, AMPHETMU, THCU, LABBARB  Alcohol Level No results found for: ETH  IMAGING Ct Head Wo Contrast  Result Date: 05/22/2018 CLINICAL DATA:  Visual loss, sudden loss of vision in the left eye EXAM: CT HEAD WITHOUT CONTRAST TECHNIQUE: Contiguous axial images were obtained from the base of the skull through the vertex without intravenous contrast. COMPARISON:  05/13/2017 FINDINGS: Brain: No acute territorial infarction, hemorrhage or intracranial mass. Moderate atrophy. Mild small vessel ischemic changes of the white matter. Stable ventricle size. Vascular: No hyperdense vessels. Vertebral artery and carotid vascular calcification Skull: Normal.  Negative for fracture or focal lesion. Sinuses/Orbits: No acute finding. Other: None IMPRESSION: 1. No CT evidence for acute intracranial abnormality. 2. Atrophy and small vessel ischemic changes of the white matter. Electronically Signed   By: Donavan Foil M.D.   On: 05/22/2018 02:04   Mr Brain Wo Contrast  Result Date: 05/22/2018 CLINICAL DATA:  LEFT vision changes since last night. History of hypertension, hyperlipidemia, sick sinus syndrome. EXAM: MRI HEAD WITHOUT CONTRAST MRA HEAD WITHOUT CONTRAST TECHNIQUE: Multiplanar, multiecho pulse sequences of the brain and surrounding structures were obtained without intravenous contrast. Angiographic images of the head were obtained using MRA technique without contrast. COMPARISON:  CT HEAD May 22, 2018 FINDINGS: MRI HEAD FINDINGS INTRACRANIAL CONTENTS: No reduced diffusion to suggest acute ischemia. No susceptibility artifact to suggest hemorrhage. The ventricles and sulci are normal for patient's age. Scattered patchy supratentorial white matter FLAIR T2 hyperintensities compatible with mild chronic small vessel ischemic changes, less than expected for age. No suspicious parenchymal signal, masses, mass effect. No abnormal extra-axial fluid collections. No extra-axial masses. VASCULAR: Normal major intracranial vascular flow voids present at skull base. SKULL AND UPPER CERVICAL SPINE: No abnormal sellar expansion. No suspicious calvarial bone marrow signal. Craniocervical junction maintained. SINUSES/ORBITS: Small sphenoid sinus air-fluid levels. Maxillary sinus mucosal thickening.The included ocular globes and orbital contents are non-suspicious. Status post bilateral ocular lens implants. OTHER: None. MRA HEAD FINDINGS ANTERIOR CIRCULATION: Normal flow related enhancement of the included cervical, petrous, cavernous and supraclinoid internal carotid arteries. Lobulated contour bilateral ICA seen with atherosclerosis, fibromuscular dysplasia or artifact. Mildly  ectatic RIGHT cervical internal carotid artery. Ectatic LEFT cavernous internal carotid artery with 2 mm blister aneurysm versus infundibulum posteriorly directed (series 81, image 91). Patent anterior communicating artery. 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 RIGHT A1 2 junction 1.5 mm inferiorly directed outpouching. Patent anterior and middle cerebral arteries. Tandem severe stenosis LEFT ACA. Severe stenosis LEFT M2 origin. Additional moderate tandem stenosis bilateral MCA consistent with atherosclerosis. No large vessel occlusion. POSTERIOR CIRCULATION: RIGHT vertebral artery is dominant. Vertebrobasilar arteries are patent, with normal flow related enhancement of the main branch vessels. Patent posterior cerebral arteries. Tandem severe RIGHT-greater-than-LEFT stenosis bilateral PCA. No large vessel occlusion,  aneurysm. ANATOMIC VARIANTS: None. Source images and MIP images were reviewed. IMPRESSION: MRI HEAD: 1. Negative noncontrast MRI head. MRA HEAD: 1. No emergent large vessel occlusion. 2. Multifocal severe stenosis anterior and posterior circulation compatible with atherosclerosis. 3. Three aneurysms anterior circulation measuring less than 2 mm. Electronically Signed   By: Elon Alas M.D.   On: 05/22/2018 15:13   US Carotid Bilateral  Result Date: 05/22/2018 CLINICAL DATA:  Vision loss of the left eye. History of hypertension, stroke/TIA, CAD (post myocardial infarction) and hypertension. EXAM: BILATERAL CAROTID DUPLEX ULTRASOUND TECHNIQUE: Pearline Cables scale imaging, color Doppler and duplex ultrasound were performed of bilateral carotid and vertebral arteries in the neck. COMPARISON:  None. FINDINGS: Examination is degraded due  to patient body habitus and poor sonographic window Criteria: Quantification of carotid stenosis is based on velocity parameters that correlate the residual internal carotid diameter with NASCET-based stenosis levels, using the diameter of the distal internal carotid lumen  as the denominator for stenosis measurement. The following velocity measurements were obtained: RIGHT ICA:  88/23 cm/sec CCA:  88/4 cm/sec SYSTOLIC ICA/CCA RATIO:  1.3 ECA:  115 cm/sec LEFT ICA:  146/35 cm/sec CCA:  16/6 cm/sec SYSTOLIC ICA/CCA RATIO:  2.5 ECA:  71 cm/sec RIGHT CAROTID ARTERY: There is mild tortuosity of the right common carotid artery. There is a minimal amount of echogenic plaque involving the mid aspect the right common carotid artery (image 7). There is a large amount of echogenic plaque within the right carotid bulb (image 16), not resulting in elevated peak systolic velocities within the interrogated course of the right internal carotid artery to suggest a hemodynamically significant stenosis. RIGHT VERTEBRAL ARTERY:  Antegrade flow LEFT CAROTID ARTERY: There is a minimal to moderate amount of echogenic plaque involving the mid and distal aspects of the left common carotid artery (image 45). There is a large amount of echogenic plaque within the left carotid bulb (images 48 and 50), extending to involve the origin and proximal aspects of the left internal carotid artery (image 58), resulting in elevated peak systolic velocities within the proximal and mid aspects of the left internal carotid artery. Greatest acquired peak systolic velocity within the proximal left ICA measures 146 centimeters/second (image 60). LEFT VERTEBRAL ARTERY:  Antegrade Flow IMPRESSION: 1. Large amount of left-sided atherosclerotic plaque results in elevated peak systolic velocities within the left internal carotid artery compatible with the 50-69% luminal narrowing range. Further evaluation with CTA could be performed as clinically indicated. 2. Moderate to large amount of right-sided atherosclerotic plaque, not definitely resulting in a hemodynamically significant stenosis. Electronically Signed   By: Sandi Mariscal M.D.   On: 05/22/2018 08:39   Mr Jodene Nam Head Wo Contrast  Result Date: 05/22/2018 CLINICAL DATA:  LEFT  vision changes since last night. History of hypertension, hyperlipidemia, sick sinus syndrome. EXAM: MRI HEAD WITHOUT CONTRAST MRA HEAD WITHOUT CONTRAST TECHNIQUE: Multiplanar, multiecho pulse sequences of the brain and surrounding structures were obtained without intravenous contrast. Angiographic images of the head were obtained using MRA technique without contrast. COMPARISON:  CT HEAD May 22, 2018 FINDINGS: MRI HEAD FINDINGS INTRACRANIAL CONTENTS: No reduced diffusion to suggest acute ischemia. No susceptibility artifact to suggest hemorrhage. The ventricles and sulci are normal for patient's age. Scattered patchy supratentorial white matter FLAIR T2 hyperintensities compatible with mild chronic small vessel ischemic changes, less than expected for age. No suspicious parenchymal signal, masses, mass effect. No abnormal extra-axial fluid collections. No extra-axial masses. VASCULAR: Normal major intracranial vascular flow voids present at skull base. SKULL AND UPPER CERVICAL SPINE: No abnormal sellar expansion. No suspicious calvarial bone marrow signal. Craniocervical junction maintained. SINUSES/ORBITS: Small sphenoid sinus air-fluid levels. Maxillary sinus mucosal thickening.The included ocular globes and orbital contents are non-suspicious. Status post bilateral ocular lens implants. OTHER: None. MRA HEAD FINDINGS ANTERIOR CIRCULATION: Normal flow related enhancement of the included cervical, petrous, cavernous and supraclinoid internal carotid arteries. Lobulated contour bilateral ICA seen with atherosclerosis, fibromuscular dysplasia or artifact. Mildly ectatic RIGHT cervical internal carotid artery. Ectatic LEFT cavernous internal carotid artery with 2 mm blister aneurysm versus infundibulum posteriorly directed (series 81, image 91). Patent anterior communicating artery. 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 RIGHT A1 2 junction 1.5 mm inferiorly directed outpouching. Patent anterior and middle  cerebral  arteries. Tandem severe stenosis LEFT ACA. Severe stenosis LEFT M2 origin. Additional moderate tandem stenosis bilateral MCA consistent with atherosclerosis. No large vessel occlusion. POSTERIOR CIRCULATION: RIGHT vertebral artery is dominant. Vertebrobasilar arteries are patent, with normal flow related enhancement of the main branch vessels. Patent posterior cerebral arteries. Tandem severe RIGHT-greater-than-LEFT stenosis bilateral PCA. No large vessel occlusion,  aneurysm. ANATOMIC VARIANTS: None. Source images and MIP images were reviewed. IMPRESSION: MRI HEAD: 1. Negative noncontrast MRI head. MRA HEAD: 1. No emergent large vessel occlusion. 2. Multifocal severe stenosis anterior and posterior circulation compatible with atherosclerosis. 3. Three aneurysms anterior circulation measuring less than 2 mm. Electronically Signed   By: Elon Alas M.D.   On: 05/22/2018 15:13   Bilateral carotid duplex Left ICA 40-59% stenosis.  Antegrade vertebral flow bilaterally.  Vessel tortuosity noted.   2D Echocardiogram  - Left ventricle: The cavity size was normal. There was mild concentric hypertrophy. Systolic function was normal. The estimated ejection fraction was in the range of 60% to 65%. Wall motion was normal; there were no regional wall motion abnormalities. Doppler parameters are consistent with abnormal left ventricular relaxation (grade 1 diastolic dysfunction). Doppler parameters are consistent with indeterminate ventricular filling pressure. - Aortic valve: Valve mobility was restricted. Transvalvular velocity was within the normal range. There was no stenosis.  There was no regurgitation. - Mitral valve: Transvalvular velocity was within the normal range. There was no evidence for stenosis. There was trivial regurgitation. - Right ventricle: The cavity size was normal. Wall thickness was normal. Systolic function was normal. - Tricuspid valve: There was mild regurgitation. - Pulmonary  arteries: Systolic pressure was within the normal range. PA peak pressure: 34 mm Hg (S). - Pericardium, extracardiac: A trivial pericardial effusion was identified.  PHYSICAL EXAM Pleasant elderly lady currently not in distress. . Afebrile. Head is nontraumatic. Neck is supple without bruit.    Cardiac exam no murmur or gallop. Lungs are clear to auscultation. Distal pulses are well felt. Neurological Exam :  Awake alert oriented 3 with normal speech and language function. Extraocular moments are full range without nystagmus. Left pupil is 2 mm and reactive. Fundi could not be visualized. She has barely minimum light perception in the left eye which is also patchy. Normal vision in the right eye. Face is symmetric without weakness. Tongue is midline. Motor system exam reveals symmetric upper and lower extremity strength without drift or focal weakness. Deep tendon reflexes are 1+ symmetric plantars downgoing. Sensation intact. Gait not tested. ASSESSMENT/PLAN [name] is a 84 y.o. female with history of AF not on AC d/t falls, CAD, CAS, DVT in pregnancy, HTN, HLD, sick sinus syndrome presenting with L eye vision loss.   L CRAO in setting of  L ICA stenosis and AF not on AC  CT head No acute stroke. Small vessel disease. Atrophy.     MRI  negative  MRA  No ELVO. Mult anterior and posterior severe stenosis d/t atherosclerosis. 3 anterior circulation aneurysms  Carotid Doppler  L ICA 50-69% per prior to arrival testing; here L ICA 40-59%  2D Echo  EF 60-65%. No source of embolus   LDL ordered  HgbA1c ordered  SCDs for VTE prophylaxis  aspirin 81 mg daily prior to admission, now on aspirin 81 mg daily. Continue.aspirin and Plavix and to 3 weeks followed by aspirin alone  Therapy recommendations:  HH PT, supervision recommended   Disposition:  pending   VVS consult recommended to eval for possible  L ICA intervention - consult called - pt not interested in pursing   Atrial  Fibrillation  Home anticoagulation:  none d/t hx falls   Cerebral Aneurysms  3 in anterior circulation  Incidental findings, no intervention  Hypertension  Stable . BP goal normotensive  Hyperlipidemia  Home meds:  No statin  LDL pending   Consider statin based on LDL results  Other Stroke Risk Factors  Advanced age  UDS / ETOH level not performed   Hx DVT during pregnancy  Other Active Problems  Dry eyes, on restasis  Hx esophageal tear, told she cannot be intubated - ? Surg candidate  CKD stage III Cr 1.63  Hospital day # 0  Burnetta Sabin, MSN, APRN, ANVP-BC, AGPCNP-BC Advanced Practice Stroke Nurse [place] for Schedule & Pager information 05/23/2018 10:09 AM  I have personally examined this patient, reviewed notes, independently viewed imaging studies, participated in medical decision making and plan of care.ROS completed by me personally and pertinent positives fully documented  I have made any additions or clarifications directly to the above note. Agree with note above. She has presented with sudden onset of painless loss of vision in left eye due to central retinal artery occlusion. Carotid ultrasound shows only moderate left carotid stenosis on clear as to whether this is symptomatic or not. Recommend vascular surgery opinion for the same.she has history of paroxysmal atrial fibrillation but has not been deemed to be a good anticoagulation candidate given history of multiple falls and bleeding risk. Discussed with patient and Dr. Donzetta Matters vascular surgery.recommend dual antiplatelet therapy of aspirin and Plavix together for 3 weeks followed by aspirin alone Greater than 50% time during this 35 minute visit was spent on counseling and coordination of care about her vision loss as well as moderate carotid stenosis, atrial fibrillation and stroke risk, prevention and treatment discussion.  Antony Contras, MD Medical Director [facility]  [facility] Pager: [phone] 05/23/2018 3:27 PM  To contact Stroke Continuity provider, please refer to [email or web address] After hours, contact General Neurology

## 2018-05-23 NOTE — Evaluation (Signed)
Physical Therapy Evaluation Patient Details Name: Amy [name] MRN: [id number] DOB: 05/22/1922 Today's Date: 05/23/2018   History of Present Illness  Amy [name] is a 84 y.o. female with medical history significant for CAD with prior CABG, CKD stage III, hypertension, hypothyroidism, sick sinus syndrome status post permanent pacemaker placement, dyslipidemia, and GERD who presented to the emergency department with vision changes; likely central retinal artery occlusion/branch retinal artery occlusion per Neuro consult  Clinical Impression  Pt admitted with above diagnosis. Pt currently with functional limitations due to the deficits listed below (see PT Problem List). Prior to admission, active in her household,  Assists her daughter with home management as able, does not go downstairs to laundry; Presents to PT with decr functional mobility, decr activity tolerance, and postural hypotension, unableto stand for 3 minutes BP, and unable to walk this session due to dizziness/low BP; See serial BPs below for details; Pt will benefit from skilled PT to increase their independence and safety with mobility to allow discharge to the venue listed below.    Pt would rather be able to go home, and her daughter tells me they can arrange for 24 hour assist; She needs to progress with activity tolerance prior to going home.     Follow Up Recommendations Home health PT;Supervision/Assistance - 24 hour;Other (comment)(HHRN as well; ?Consider [facility]?); Daughter is interested in getting a list of private aide service providers    Equipment Recommendations  Wheelchair (measurements PT);Wheelchair cushion (measurements PT);Other (comment)(transport chair)    Recommendations for Other Services OT consult(ordered per protocol)     Precautions / Restrictions Precautions Precautions: Fall Precaution Comments: Watch for postural hypotension Restrictions Weight Bearing Restrictions: No       Mobility  Bed Mobility               General bed mobility comments: Sitting EOB upon arrival  Transfers Overall transfer level: Needs assistance Equipment used: Rolling walker (2 wheeled) Transfers: Sit to/from Stand Sit to Stand: Min guard         General transfer comment: Very slow and effortful rise from bed; Smoother rise from recliner with use of armrests; minguard for safety  Ambulation/Gait Ambulation/Gait assistance: Min guard Gait Distance (Feet): 5 Feet(bed to recliner) Assistive device: Rolling walker (2 wheeled)       General Gait Details: Cues to self-monitor for activity tolerance  Stairs            Wheelchair Mobility    Modified Rankin (Stroke Patients Only) Modified Rankin (Stroke Patients Only) Pre-Morbid Rankin Score: Slight disability Modified Rankin: Moderate disability     Balance Overall balance assessment: Needs assistance   Sitting balance-Leahy Scale: Fair       Standing balance-Leahy Scale: Poor                               Pertinent Vitals/Pain Pain Assessment: No/denies pain    Home Living Family/patient expects to be discharged to:: Private residence Living Arrangements: Children Available Help at Discharge: Family;Available PRN/intermittently(Near 24 hour assist) Type of Home: House Home Access: Stairs to enter Entrance Stairs-Rails: None Entrance Stairs-Number of Steps: 1(1 at front, 6 at back) Home Layout: Laundry or work area in [place]: Environmental consultant - 2 wheels;Walker - 4 wheels;Cane - single point;Bedside commode;Shower seat(Life-alert service)      Prior Function Level of Independence: Needs assistance   Gait / Transfers Assistance Needed: Walks household distances  with Rollator RW  ADL's / Homemaking Assistance Needed: Daughter assists with shower        Hand Dominance   Dominant Hand: Right    Extremity/Trunk Assessment   Upper Extremity Assessment Upper Extremity  Assessment: Defer to OT evaluation    Lower Extremity Assessment Lower Extremity Assessment: Generalized weakness       Communication   Communication: No difficulties  Cognition Arousal/Alertness: Awake/alert Behavior During Therapy: WFL for tasks assessed/performed Overall Cognitive Status: Within Functional Limits for tasks assessed                                        General Comments General comments (skin integrity, edema, etc.):    05/23/18 0940 05/23/18 0945  Orthostatic Sitting  BP- Sitting 144/85 140/64  Pulse- Sitting 66 71  Orthostatic Standing at 0 minutes  BP- Standing at 0 minutes 91/72 (!) 88/59  Pulse- Standing at 0 minutes 83 (Aymptomatic for dizziness) 79   Unable to stand long enough for 3 minute standing BP    Exercises     Assessment/Plan    PT Assessment Patient needs continued PT services  PT Problem List Decreased strength;Decreased activity tolerance;Decreased balance;Decreased mobility;Decreased coordination;Decreased knowledge of use of DME;Decreased safety awareness;Decreased knowledge of precautions;Cardiopulmonary status limiting activity       PT Treatment Interventions DME instruction;Gait training;Stair training;Functional mobility training;Therapeutic activities;Therapeutic exercise;Balance training;Patient/family education    PT Goals (Current goals can be found in the Care Plan section)  Acute Rehab PT Goals Patient Stated Goal: "to feel normal" PT Goal Formulation: With patient Time For Goal Achievement: 06/06/18 Potential to Achieve Goals: Good    Frequency Min 3X/week   Barriers to discharge Other (comment)(Decr activity tolerance) Recommend 24 hour assist at elast initially    Co-evaluation               AM-PAC PT "6 Clicks" Daily Activity  Outcome Measure Difficulty turning over in bed (including adjusting bedclothes, sheets and blankets)?: None Difficulty moving from lying on back to sitting  on the side of the bed? : A Little Difficulty sitting down on and standing up from a chair with arms (e.g., wheelchair, bedside commode, etc,.)?: A Lot Help needed moving to and from a bed to chair (including a wheelchair)?: A Little Help needed walking in hospital room?: A Lot Help needed climbing 3-5 steps with a railing? : Total 6 Click Score: 15    End of Session Equipment Utilized During Treatment: Gait belt(vitals machine) Activity Tolerance: Other (comment)(limited by orthostatic hypotension) Patient left: in chair;with call bell/phone within reach;with chair alarm set Nurse Communication: Mobility status;Other (comment)(BP drop in standing) PT Visit Diagnosis: Unsteadiness on feet (R26.81);Other abnormalities of gait and mobility (R26.89);Other (comment)(Decr functional capacity)    Time: 5300-5110 PT Time Calculation (min) (ACUTE ONLY): 38 min   Charges:   PT Evaluation $PT Eval Moderate Complexity: 1 Mod PT Treatments $Therapeutic Activity: 23-37 mins   PT G Codes:        Roney Marion, PT  Acute Rehabilitation Services Pager [phone] Office [phone]   [place] 05/23/2018, 11:54 AM

## 2018-05-23 NOTE — Evaluation (Signed)
Occupational Therapy Evaluation Patient Details Name: Amy M Weidler MRN: [id number] DOB: 16-Jun-1922 Today's Date: 05/23/2018    History of Present Illness Amy [name] is a 84 y.o. female with medical history significant for CAD with prior CABG, CKD stage III, hypertension, hypothyroidism, sick sinus syndrome status post permanent pacemaker placement, dyslipidemia, and GERD who presented to the emergency department with vision changes. Neurologist indicates likely central retinal artery occlusion/branch retinal artery occlusion.   Clinical Impression   PTA, pt was independent with basic ADL and utilized rollator for functional mobility. Her daughter assists with home management. Pt presents with decreased activity tolerance for ADL participation as well as generalized weakness. She reports decreased vision in lower half of vision in L eye reporting that it appears "purple." Pt additionally with diminished peripheral vision in lower half of visual fields with L more impacted than R. Pt with BP 118/61 in sitting and dropping to 104/50 upon standing. However, improved to 129/66 after three minutes of standing and ambulating. Pt would benefit from continued OT services while admitted to improve independence and safety with ADL and functional mobility. Recommend 24 hour assistance post-acute D/C as well as maximum home health services including OT. OT will continue to follow while admitted.  Follow Up Recommendations  Home health OT;Supervision/Assistance - 24 hour    Equipment Recommendations  None recommended by OT    Recommendations for Other Services       Precautions / Restrictions Precautions Precautions: Fall Precaution Comments: Watch for postural hypotension Restrictions Weight Bearing Restrictions: No      Mobility Bed Mobility Overal bed mobility: Needs Assistance Bed Mobility: Supine to Sit;Sit to Supine     Supine to sit: Min guard Sit to supine: Min guard   General bed  mobility comments: Guarding for safety. Requires increased effort.   Transfers Overall transfer level: Needs assistance Equipment used: Rolling walker (2 wheeled) Transfers: Sit to/from Stand Sit to Stand: Min guard         General transfer comment: guarding assist for safety    Balance Overall balance assessment: Needs assistance Sitting-balance support: No upper extremity supported;Feet supported Sitting balance-Leahy Scale: Fair     Standing balance support: Bilateral upper extremity supported;No upper extremity supported;During functional activity Standing balance-Leahy Scale: Fair Standing balance comment: Able to statically stand without UE support but relies on B UE support for dynamic tasks.                            ADL either performed or assessed with clinical judgement   ADL Overall ADL's : Needs assistance/impaired Eating/Feeding: Set up;Sitting   Grooming: Min guard;Standing   Upper Body Bathing: Supervision/ safety;Sitting   Lower Body Bathing: Min guard;Sit to/from stand   Upper Body Dressing : Supervision/safety;Sitting   Lower Body Dressing: Min guard;Sit to/from stand   Toilet Transfer: Min guard;Ambulation;RW Toilet Transfer Details (indicate cue type and reason): simulated with sit<>stand followed by functional mobility in room Toileting- Clothing Manipulation and Hygiene: Min guard;Sit to/from stand       Functional mobility during ADLs: Min guard;Rolling walker General ADL Comments: Pt requiring guarding assistance in room and reporting dizziness and fatigue with minimal activity.      Vision Baseline Vision/History: Wears glasses Wears Glasses: Reading only Patient Visual Report: Other (comment)(purple vision lower half of L visual field) Vision Assessment?: Yes Eye Alignment: Within Functional Limits Ocular Range of Motion: Within Functional Limits Alignment/Gaze Preference: Within Defined Limits Tracking/Visual  Pursuits:  Able to track stimulus in all quads without difficulty Saccades: Additional eye shifts occurred during testing Convergence: Within functional limits Visual Fields: Other (comment)(difficulty with lower half of vision (L worse than R)) Additional Comments: Pt reporting that she is experiencing purple/red vision in lower half of L eye vision.      Perception     Praxis      Pertinent Vitals/Pain Pain Assessment: No/denies pain     Hand Dominance Right   Extremity/Trunk Assessment Upper Extremity Assessment Upper Extremity Assessment: Defer to OT evaluation   Lower Extremity Assessment Lower Extremity Assessment: Generalized weakness       Communication Communication Communication: No difficulties   Cognition Arousal/Alertness: Awake/alert Behavior During Therapy: WFL for tasks assessed/performed Overall Cognitive Status: Within Functional Limits for tasks assessed                                     General Comments       Exercises     Shoulder Instructions      Home Living Family/patient expects to be discharged to:: Private residence Living Arrangements: Children Available Help at Discharge: Family;Available PRN/intermittently(near 24 hour assist) Type of Home: House Home Access: Stairs to enter [employer] of Steps: 1(1 at front, 6 at back) Entrance Stairs-Rails: None Home Layout: Laundry or work area in basement     [employer] Shower/Tub: Occupational psychologist: Standard     Home Equipment: Environmental consultant - 2 wheels;Walker - 4 wheels;Cane - single point;Bedside commode;Shower seat(Life alert service)          Prior Functioning/Environment Level of Independence: Needs assistance  Gait / Transfers Assistance Needed: Walks household distances with Rollator  ADL's / Homemaking Assistance Needed: Daughter assists with shower and housework.            OT Problem List: Decreased strength;Decreased activity tolerance;Impaired  balance (sitting and/or standing);Decreased safety awareness;Decreased knowledge of use of DME or AE;Decreased knowledge of precautions;Impaired vision/perception      OT Treatment/Interventions: Self-care/ADL training;Therapeutic exercise;Energy conservation;DME and/or AE instruction;Therapeutic activities;Patient/family education;Balance training;Visual/perceptual remediation/compensation    OT Goals(Current goals can be found in the care plan section) Acute Rehab OT Goals Patient Stated Goal: "to feel normal" OT Goal Formulation: With patient/family Time For Goal Achievement: 06/06/18 Potential to Achieve Goals: Good ADL Goals Pt Will Perform Grooming: with modified independence;standing Pt Will Perform Lower Body Dressing: with modified independence;sit to/from stand Pt Will Transfer to Toilet: with modified independence;ambulating;regular height toilet Pt Will Perform Toileting - Clothing Manipulation and hygiene: with modified independence;sit to/from stand Additional ADL Goal #1: Pt will incorporate 2 strategies to compensate for decreased functional use of vision during daily ADL routine.  OT Frequency: Min 2X/week   Barriers to D/C:            Co-evaluation              AM-PAC PT "6 Clicks" Daily Activity     Outcome Measure Help from another person eating meals?: None Help from another person taking care of personal grooming?: A Little Help from another person toileting, which includes using toliet, bedpan, or urinal?: A Little Help from another person bathing (including washing, rinsing, drying)?: A Little Help from another person to put on and taking off regular upper body clothing?: None Help from another person to put on and taking off regular lower body clothing?: A Little 6 Click Score: 20  End of Session Equipment Utilized During Treatment: Surveyor, mining Communication: Mobility status  Activity Tolerance: Patient tolerated treatment well Patient  left: in bed;with call bell/phone within reach;with family/visitor present  OT Visit Diagnosis: Other abnormalities of gait and mobility (R26.89);Dizziness and giddiness (R42);Low vision, both eyes (H54.2)                Time: 1640-1700 OT Time Calculation (min): 20 min Charges:  OT General Charges $OT Visit: 1 Visit OT Evaluation $OT Eval Moderate Complexity: 1 Mod G-Codes:     Norman Herrlich, MS OTR/L  Pager: [place] 05/23/2018, 5:29 PM

## 2018-05-24 ENCOUNTER — Telehealth: Payer: Self-pay | Admitting: Vascular Surgery

## 2018-05-24 DIAGNOSIS — M609 Myositis, unspecified: Secondary | ICD-10-CM

## 2018-05-24 LAB — CBC WITH DIFFERENTIAL/PLATELET
ABS IMMATURE GRANULOCYTES: 0 10*3/uL (ref 0.0–0.1)
BASOS ABS: 0.1 10*3/uL (ref 0.0–0.1)
Basophils Relative: 1 %
EOS ABS: 0.3 10*3/uL (ref 0.0–0.7)
Eosinophils Relative: 4 %
HCT: 38.3 % (ref 36.0–46.0)
Hemoglobin: 12.4 g/dL (ref 12.0–15.0)
IMMATURE GRANULOCYTES: 0 %
Lymphocytes Relative: 30 %
Lymphs Abs: 1.9 10*3/uL (ref 0.7–4.0)
MCH: 30.2 pg (ref 26.0–34.0)
MCHC: 32.4 g/dL (ref 30.0–36.0)
MCV: 93.2 fL (ref 78.0–100.0)
MONO ABS: 0.6 10*3/uL (ref 0.1–1.0)
Monocytes Relative: 10 %
NEUTROS ABS: 3.5 10*3/uL (ref 1.7–7.7)
NEUTROS PCT: 55 %
Platelets: 189 10*3/uL (ref 150–400)
RBC: 4.11 MIL/uL (ref 3.87–5.11)
RDW: 14.2 % (ref 11.5–15.5)
WBC: 6.3 10*3/uL (ref 4.0–10.5)

## 2018-05-24 LAB — BASIC METABOLIC PANEL
Anion gap: 11 (ref 5–15)
BUN: 32 mg/dL — AB (ref 8–23)
CALCIUM: 9 mg/dL (ref 8.9–10.3)
CO2: 26 mmol/L (ref 22–32)
Chloride: 101 mmol/L (ref 98–111)
Creatinine, Ser: 1.77 mg/dL — ABNORMAL HIGH (ref 0.44–1.00)
GFR calc Af Amer: 27 mL/min — ABNORMAL LOW (ref >60)
GFR, EST NON AFRICAN AMERICAN: 23 mL/min — AB (ref >60)
GLUCOSE: 105 mg/dL — AB (ref 70–99)
POTASSIUM: 3.9 mmol/L (ref 3.5–5.1)
SODIUM: 138 mmol/L (ref 135–145)

## 2018-05-24 MED ORDER — CLOPIDOGREL BISULFATE 75 MG PO TABS: 75.0000 mg | ORAL_TABLET | Freq: Every day | ORAL | 0 refills | Status: AC

## 2018-05-24 NOTE — Progress Notes (Signed)
Occupational Therapy Treatment Patient Details Name: Amy [name] MRN: [id number] DOB: [birth year] Today's Date: 05/24/2018    History of present illness Amy [name] is a 84 y.o. female with medical history significant for CAD with prior CABG, CKD stage III, hypertension, hypothyroidism, sick sinus syndrome status post permanent pacemaker placement, dyslipidemia, and GERD who presented to the emergency department with vision changes; likely central retinal artery occlusion/branch retinal artery occlusion per Neuro consult   OT comments  Patient progressing well.  Demonstrating ability to complete toilet transfers with min guard assistance, toileting with supervision standing and grooming standing with min guard assistance while reaching outside base of support to retreive items.  Functional mobility in restroom with min guard assistance, with decreased awareness of obstacles on L side given cueing and physical support to manage walker. VSS. Patient reports "purplish-brownish" color in L lower quadrants when closing R eye but "just fuzzy" when both eyes are open.  Educated patient and family on compensatory techniques and fall prevention techniques in order to maximize safety due to visual changes. Verbalize understanding and have no further questions or concerns.  Patient will have 24/7 support at home. Will continue to follow while admitted.    Follow Up Recommendations  Home health OT;Supervision/Assistance - 24 hour    Equipment Recommendations  None recommended by OT    Recommendations for Other Services      Precautions / Restrictions Precautions Precautions: Fall Precaution Comments: monitor BP Restrictions Weight Bearing Restrictions: No       Mobility Bed Mobility               General bed mobility comments: pt OOB in recliner   Transfers Overall transfer level: Needs assistance Equipment used: Rolling walker (2 wheeled) Transfers: Sit to/from Stand Sit to Stand:  Min guard         General transfer comment: min guard for safety     Balance Overall balance assessment: Needs assistance Sitting-balance support: No upper extremity supported;Feet supported Sitting balance-Leahy Scale: Good     Standing balance support: No upper extremity supported;During functional activity Standing balance-Leahy Scale: Poor Standing balance comment: statically standing during self care with close stand by assist                           ADL either performed or assessed with clinical judgement   ADL Overall ADL's : Needs assistance/impaired     Grooming: Min guard;Standing;Wash/dry Geophysical data processor Transfer: Min guard;Ambulation;RW;Comfort height toilet Toilet Transfer Details (indicate cue type and reason): commode using RW, good safety and technique, intermittent cueing to avoid obstacles on L side  Toileting- Clothing Manipulation and Hygiene: Supervision/safety;Sit to/from stand Toileting - Clothing Manipulation Details (indicate cue type and reason): standing for hygiene and clothing mgmt      Functional mobility during ADLs: Min guard;Rolling walker General ADL Comments: min guard for safety      Vision       Perception     Praxis      Cognition Arousal/Alertness: Awake/alert Behavior During Therapy: WFL for tasks assessed/performed Overall Cognitive Status: Within Functional Limits for tasks assessed  Exercises     Shoulder Instructions       General Comments VSS, reviewed visual compensatory techniques    Pertinent Vitals/ Pain       Pain Assessment: No/denies pain  Home Living                                          Prior Functioning/Environment              Frequency  Min 2X/week        Progress Toward Goals  OT Goals(current goals can now be found in the care plan section)  Progress towards OT goals:  Progressing toward goals  Acute Rehab OT Goals Patient Stated Goal: home today OT Goal Formulation: With patient/family Time For Goal Achievement: 06/06/18 Potential to Achieve Goals: Good  Plan Discharge plan remains appropriate;Frequency remains appropriate    Co-evaluation                 AM-PAC PT "6 Clicks" Daily Activity     Outcome Measure   Help from another person eating meals?: None Help from another person taking care of personal grooming?: A Little Help from another person toileting, which includes using toliet, bedpan, or urinal?: A Little Help from another person bathing (including washing, rinsing, drying)?: A Little Help from another person to put on and taking off regular upper body clothing?: None Help from another person to put on and taking off regular lower body clothing?: A Little 6 Click Score: 20    End of Session Equipment Utilized During Treatment: Rolling walker  OT Visit Diagnosis: Other abnormalities of gait and mobility (R26.89);Dizziness and giddiness (R42);Low vision, both eyes (H54.2)   Activity Tolerance Patient tolerated treatment well   Patient Left in bed;with call bell/phone within reach;with family/visitor present   Nurse Communication Mobility status        Time: 6962-9528 OT Time Calculation (min): 17 min  Charges: OT General Charges $OT Visit: 1 Visit OT Treatments $Self Care/Home Management : 8-22 mins  Delight Stare, OTR/L  Pager [place] 05/24/2018, 11:38 AM

## 2018-05-24 NOTE — Care Management Note (Signed)
Case Management Note  Patient Details  Name: Amy [name] MRN: [id number] Date of Birth: 1922/02/13  Subjective/Objective:  Pt presented for Acute vision loss. PTA from home with support of daughter. Previous CM did order DME lightweight wheelchair- has been delivered to the room.                    Action/Plan: CM did discuss with patient and her daughter feels like she will benefit from [facility] RN, PT/OT. Referral made to [facility] with [facility]. SOC to begin within 24-48 hours post transition home. Pt will need HH RN, PT/OT orders and F2F before transition home.  No further needs from CM at this time.   Expected Discharge Date:                  Expected Discharge Plan:  [place]  In-House Referral:  NA  Discharge planning Services  CM Consult  Post Acute Care Choice:  Home Health, Durable Medical Equipment Choice offered to:  Patient, Adult Children  DME Arranged:  Youth worker wheelchair with seat cushion DME Agency:  [place]:  PT, OT, RN, Disease Management [place] Agency:  [place]  Status of Service:  Completed, signed off  If discussed at [place] of Stay Meetings, dates discussed:    Additional Comments:  Bethena Roys, RN 05/24/2018, 10:58 AM

## 2018-05-24 NOTE — Telephone Encounter (Signed)
sch appt [facility] 08/31/18 10am carotid 1045am f/u MD

## 2018-05-24 NOTE — Discharge Summary (Signed)
Discharge Summary  Niger M Matar JYN:[id number] DOB: 27-Jul-1922  PCP: Celene Squibb, MD  Admit date: 05/22/2018 Discharge date: 05/24/2018  Time spent: 40 mins  Recommendations for Outpatient Follow-up:  1. PCP-already has an appt 2. Ophthalmology-already has an appt 3. Cardiology-plan to set up an appt 4. Vascular surgery on 08/31/18  Discharge Diagnoses:  Active Hospital Problems   Diagnosis Date Noted  . Acute loss of vision, left 05/22/2018  . Myositis, statin related 05/24/2018  . Hypothyroidism 05/22/2018  . Vision loss of left eye 05/22/2018  . SSS (sick sinus syndrome) ([place]) 11/26/2014  . GERD (gastroesophageal reflux disease) 06/09/2014  . CKD (chronic kidney disease), stage III ([facility]) 08/08/2012  . CAD, CABG X 3 10/09. Myoview low risk 10/10. EF >55% 2D 6/13. 08/07/2012  . Hypertension 08/07/2012    Resolved Hospital Problems  No resolved problems to display.    Discharge Condition: Stable  Diet recommendation: Heart healthy  Vitals:   05/24/18 0758 05/24/18 0759  BP:    Pulse:    Resp:    Temp:    SpO2: 96% 96%    History of present illness:  Mrs. Dohn a 84 y.o.Fwith medical history significant for SSS with pacer, CAD s/p CABG, Afib no longer on AC, CKD III-IV baseline Cr 1.4-1.6, HTN, hypothyroidism who presents with acute painless monocular vision loss described as a film over L eye. In ER, tele-neuro deemed her not a tPA candidate, was suscpicious for anterior ischemic optic neuropathy or CRAO. [place] in the ER showed 50-70% plaque on left carotid.  Patient had a fundoscopy exam in the ED which showed redness and blurring of the macula, no obvious vitreous hemorrhage no diabetes in the anterior chamber, pressure left eye was 14 and pressure right eye was 13. Pt admitted for further management.  Today, patient feels like her vision is slightly better. Denies any headaches, no focal neurologic deficit noted, no chest pain, no abdominal pain, no  fever/chills. Pt stable for discharge   Hospital Course:  Principal Problem:   Acute loss of vision, left Active Problems:   Hypertension   CAD, CABG X 3 10/09. Myoview low risk 10/10. EF >55% 2D 6/13.   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 fib not on any AC due to risk of falls/bleeds Carotid Doppler showed L ICA 40 to 59% CT/MRI head unremarkable MRA showed multiple anterior and posterior severe stenosis due to atherosclerosis. 3 anterior circulation aneurysms Echo showed EF of 60 to 65%, no source of embolus Neurology on board, recommend aspirin and Plavix for 3 weeks followed by aspirin alone due to high risk of bleeding and patient's age VVS consulted for possible LICA intervention, patient not interested in pursuing any invasive procedure Ophthalmology consulted, recommend outpatient follow-up Pt to follow up with PCP, ophthalmology, [facility] HH PT/OT, RN all arranged  Paroxysmal A. Fib, SSS with pacemaker CHA2DS2-Vasc6 Last EP note states that she has low AF burden by pacer, not on AC due to history of serious falls Continue metoprolol  Orthostatic hypotension Resolved s/p gentle hydration Held Lasix, till PCP appt on 05/28/18  Hypertension Stable  CAD/chronic diastolic HF Echo as above, normal EF Held Lasix due to episode of orthostatic hypotension Continue metoprolol  HLD LDL 257 Has tried multiple statins in the past, couldn't tolerate it due to myositis To follow up with cardiology  CKD stage IV Creatinine at baseline Daily BMP  Hypothyroidism Continue Synthroid  COPD Stable Continue inhalers     Procedures:  None  Consultations:  Neurology  Vascular surgery   Discharge Exam: BP (!) 145/54  (BP Location: Left Arm)   Pulse 68   Temp 97.9 F (36.6 C) (Oral)   Resp 17   Ht 5' 5.5" (1.664 m)   Wt 79.4 kg (175 lb)   SpO2 96%   BMI 28.68 kg/m   General: NAD  Cardiovascular: S1-S2 present Respiratory: CTAB  Discharge Instructions You were cared for by a hospitalist during your hospital stay. If you have any questions about your discharge medications or the care you received while you were in the hospital after you are discharged, you can call the unit and asked to speak with the hospitalist on call if the hospitalist that took care of you is not available. Once you are discharged, your primary care physician will handle any further medical issues. Please note that NO REFILLS for any discharge medications will be authorized once you are discharged, as it is imperative that you return to your primary care physician (or establish a relationship with a primary care physician if you do not have one) for your aftercare needs so that they can reassess your need for medications and monitor your lab values.   Allergies as of 05/24/2018      Reactions   Colestipol Other (See Comments)   Muscle aches   Hydrocodone-acetaminophen Itching, Other (See Comments)   Tolerates tylenol   Niacin And Related Other (See Comments)   Unknown   Statins Other (See Comments)   Muscle aches on all she has tried.  She recalls Lipitor, Crestor, and Livalo but thinks there were others   Zetia [ezetimibe] Other (See Comments)   Muscle aches      Medication List    STOP taking these medications   methylPREDNISolone 4 MG Tbpk tablet Commonly known as:  MEDROL DOSEPAK     TAKE these medications   acetaminophen 500 MG tablet Commonly known as:  TYLENOL Take 1,000 mg by mouth every 6 (six) hours as needed for moderate pain.   albuterol 108 (90 Base) MCG/ACT inhaler Commonly known as:  PROVENTIL HFA;VENTOLIN HFA Inhale 2 puffs into the lungs every 6 (six) hours as needed for wheezing or shortness of  breath.   allopurinol 100 MG tablet Commonly known as:  ZYLOPRIM Take 100 mg by mouth daily.   aspirin EC 81 MG tablet Take 81 mg by mouth daily.   BENEFIBER DRINK MIX PO Take 1 Dose by mouth 2 (two) times daily as needed (constipation).   clopidogrel 75 MG tablet Commonly known as:  PLAVIX Take 1 tablet (75 mg total) by mouth daily for 21 days. Start taking on:  05/25/2018   CULTURELLE DIGESTIVE HEALTH PO Take 1 capsule by mouth daily.   cycloSPORINE 0.05 % ophthalmic emulsion Commonly known as:  RESTASIS Place 1 drop into both eyes 2 (two) times daily.   docusate sodium 100 MG capsule Commonly known as:  COLACE Take 100 mg by mouth 2 (two) times daily.   furosemide 40 MG tablet Commonly known as:  LASIX TAKE 1 TABLET BY MOUTH ONCE DAILY AS DIRECTED   levothyroxine 50 MCG tablet Commonly known as:  SYNTHROID, LEVOTHROID Take 50 mcg by mouth daily before breakfast.  LORazepam 1 MG tablet Commonly known as:  ATIVAN Take 1 mg by mouth at bedtime as needed for anxiety or sleep.   metoprolol succinate 25 MG 24 hr tablet Commonly known as:  TOPROL XL Take 1 tablet (25 mg total) by mouth daily.   nitroGLYCERIN 0.4 MG SL tablet Commonly known as:  NITROSTAT Place 1 tablet (0.4 mg total) under the tongue every 5 (five) minutes x 3 doses as needed for chest pain.   omeprazole 40 MG capsule Commonly known as:  PRILOSEC TAKE ONE CAPSULE BY MOUTH ONCE DAILY What changed:    how much to take  how to take this  when to take this   potassium chloride 10 MEQ tablet Commonly known as:  K-DUR Take 1 tablet (10 mEq total) by mouth daily as directed. What changed:    how much to take  how to take this  when to take this  additional instructions   ranitidine 150 MG tablet Commonly known as:  ZANTAC TAKE ONE TABLET BY MOUTH ONCE DAILY AT BEDTIME What changed:    how much to take  how to take this  when to take this   TART CHERRY ADVANCED PO Take 1 tablet  by mouth daily.   TRELEGY ELLIPTA 100-62.5-25 MCG/INH Aepb Generic drug:  Fluticasone-Umeclidin-Vilant Inhale 1 puff into the lungs daily.   VITAMIN D3 PO Take 1 tablet by mouth daily.            Durable Medical Equipment  (From admission, onward)        Start     Ordered   05/23/18 1347  For home use only DME lightweight manual wheelchair with seat cushion  Once    Comments:  Patient suffers from vision loss weakness and orthostasis which impairs their ability to perform daily activities like walking in the home.  A walker will not resolve  issue with performing activities of daily living. A wheelchair will allow patient to safely perform daily activities. Patient is not able to propel themselves in the home using a standard weight wheelchair due to vision loss weakness and orthostasis. Patient can self propel in the lightweight wheelchair.  Accessories: elevating leg rests (ELRs), wheel locks, extensions and anti-tippers.   05/23/18 1347     Allergies  Allergen Reactions  . Colestipol Other (See Comments)    Muscle aches  . Hydrocodone-Acetaminophen Itching and Other (See Comments)    Tolerates tylenol  . Niacin And Related Other (See Comments)    Unknown  . Statins Other (See Comments)    Muscle aches on all she has tried.  She recalls Lipitor, Crestor, and Livalo but thinks there were others  . Zetia [Ezetimibe] Other (See Comments)    Muscle aches   Follow-up Information    [facility].   Specialty:  Home Health Services Why:  FOr home health services. They will call you in 1-2 days to set up your first home visit. You will receive a wheelchair prior to discharge.  Contact information: [place] [address] [address] Follow up.   Why:  Light weight wheel chair.  Contact information: [address] [place] [address] [phone]        Celene Squibb, MD.  Schedule an appointment as soon as possible for a visit in 1 week(s).   Specialty:  Internal Medicine Contact information: [place]  [place] [address] [phone]            The results of significant diagnostics from this hospitalization (including imaging, microbiology, ancillary and laboratory) are listed below for reference.    Significant Diagnostic Studies: Ct Head Wo Contrast  Result Date: 05/22/2018 CLINICAL DATA:  Visual loss, sudden loss of vision in the left eye EXAM: CT HEAD WITHOUT CONTRAST TECHNIQUE: Contiguous axial images were obtained from the base of the skull through the vertex without intravenous contrast. COMPARISON:  05/13/2017 FINDINGS: Brain: No acute territorial infarction, hemorrhage or intracranial mass. Moderate atrophy. Mild small vessel ischemic changes of the white matter. Stable ventricle size. Vascular: No hyperdense vessels. Vertebral artery and carotid vascular calcification Skull: Normal. Negative for fracture or focal lesion. Sinuses/Orbits: No acute finding. Other: None IMPRESSION: 1. No CT evidence for acute intracranial abnormality. 2. Atrophy and small vessel ischemic changes of the white matter. Electronically Signed   By: Donavan Foil M.D.   On: 05/22/2018 02:04   Mr Brain Wo Contrast  Result Date: 05/22/2018 CLINICAL DATA:  LEFT vision changes since last night. History of hypertension, hyperlipidemia, sick sinus syndrome. EXAM: MRI HEAD WITHOUT CONTRAST MRA HEAD WITHOUT CONTRAST TECHNIQUE: Multiplanar, multiecho pulse sequences of the brain and surrounding structures were obtained without intravenous contrast. Angiographic images of the head were obtained using MRA technique without contrast. COMPARISON:  CT HEAD May 22, 2018 FINDINGS: MRI HEAD FINDINGS INTRACRANIAL CONTENTS: No reduced diffusion to suggest acute ischemia. No susceptibility artifact to suggest hemorrhage. The ventricles and sulci are normal for patient's age.  Scattered patchy supratentorial white matter FLAIR T2 hyperintensities compatible with mild chronic small vessel ischemic changes, less than expected for age. No suspicious parenchymal signal, masses, mass effect. No abnormal extra-axial fluid collections. No extra-axial masses. VASCULAR: Normal major intracranial vascular flow voids present at skull base. SKULL AND UPPER CERVICAL SPINE: No abnormal sellar expansion. No suspicious calvarial bone marrow signal. Craniocervical junction maintained. SINUSES/ORBITS: Small sphenoid sinus air-fluid levels. Maxillary sinus mucosal thickening.The included ocular globes and orbital contents are non-suspicious. Status post bilateral ocular lens implants. OTHER: None. MRA HEAD FINDINGS ANTERIOR CIRCULATION: Normal flow related enhancement of the included cervical, petrous, cavernous and supraclinoid internal carotid arteries. Lobulated contour bilateral ICA seen with atherosclerosis, fibromuscular dysplasia or artifact. Mildly ectatic RIGHT cervical internal carotid artery. Ectatic LEFT cavernous internal carotid artery with 2 mm blister aneurysm versus infundibulum posteriorly directed (series 81, image 91). Patent anterior communicating artery. 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 RIGHT A1 2 junction 1.5 mm inferiorly directed outpouching. Patent anterior and middle cerebral arteries. Tandem severe stenosis LEFT ACA. Severe stenosis LEFT M2 origin. Additional moderate tandem stenosis bilateral MCA consistent with atherosclerosis. No large vessel occlusion. POSTERIOR CIRCULATION: RIGHT vertebral artery is dominant. Vertebrobasilar arteries are patent, with normal flow related enhancement of the main branch vessels. Patent posterior cerebral arteries. Tandem severe RIGHT-greater-than-LEFT stenosis bilateral PCA. No large vessel occlusion,  aneurysm. ANATOMIC VARIANTS: None. Source images and MIP images were reviewed. IMPRESSION: MRI HEAD: 1. Negative noncontrast MRI head. MRA  HEAD: 1. No emergent large vessel occlusion. 2. Multifocal severe stenosis anterior and posterior circulation compatible with atherosclerosis. 3. Three aneurysms anterior circulation measuring less than 2 mm. Electronically Signed   By: Elon Alas M.D.   On: 05/22/2018 15:13   US Carotid Bilateral  Result Date: 05/22/2018 CLINICAL DATA:  Vision loss of the left eye. History of hypertension, stroke/TIA, CAD (post myocardial infarction) and hypertension. EXAM: BILATERAL CAROTID DUPLEX ULTRASOUND TECHNIQUE: Pearline Cables scale imaging,  color Doppler and duplex ultrasound were performed of bilateral carotid and vertebral arteries in the neck. COMPARISON:  None. FINDINGS: Examination is degraded due to patient body habitus and poor sonographic window Criteria: Quantification of carotid stenosis is based on velocity parameters that correlate the residual internal carotid diameter with NASCET-based stenosis levels, using the diameter of the distal internal carotid lumen as the denominator for stenosis measurement. The following velocity measurements were obtained: RIGHT ICA:  88/23 cm/sec CCA:  53/6 cm/sec SYSTOLIC ICA/CCA RATIO:  1.3 ECA:  115 cm/sec LEFT ICA:  146/35 cm/sec CCA:  14/4 cm/sec SYSTOLIC ICA/CCA RATIO:  2.5 ECA:  71 cm/sec RIGHT CAROTID ARTERY: There is mild tortuosity of the right common carotid artery. There is a minimal amount of echogenic plaque involving the mid aspect the right common carotid artery (image 7). There is a large amount of echogenic plaque within the right carotid bulb (image 16), not resulting in elevated peak systolic velocities within the interrogated course of the right internal carotid artery to suggest a hemodynamically significant stenosis. RIGHT VERTEBRAL ARTERY:  Antegrade flow LEFT CAROTID ARTERY: There is a minimal to moderate amount of echogenic plaque involving the mid and distal aspects of the left common carotid artery (image 45). There is a large amount of echogenic  plaque within the left carotid bulb (images 48 and 50), extending to involve the origin and proximal aspects of the left internal carotid artery (image 58), resulting in elevated peak systolic velocities within the proximal and mid aspects of the left internal carotid artery. Greatest acquired peak systolic velocity within the proximal left ICA measures 146 centimeters/second (image 60). LEFT VERTEBRAL ARTERY:  Antegrade Flow IMPRESSION: 1. Large amount of left-sided atherosclerotic plaque results in elevated peak systolic velocities within the left internal carotid artery compatible with the 50-69% luminal narrowing range. Further evaluation with CTA could be performed as clinically indicated. 2. Moderate to large amount of right-sided atherosclerotic plaque, not definitely resulting in a hemodynamically significant stenosis. Electronically Signed   By: Sandi Mariscal M.D.   On: 05/22/2018 08:39   Mr Jodene Nam Head Wo Contrast  Result Date: 05/22/2018 CLINICAL DATA:  LEFT vision changes since last night. History of hypertension, hyperlipidemia, sick sinus syndrome. EXAM: MRI HEAD WITHOUT CONTRAST MRA HEAD WITHOUT CONTRAST TECHNIQUE: Multiplanar, multiecho pulse sequences of the brain and surrounding structures were obtained without intravenous contrast. Angiographic images of the head were obtained using MRA technique without contrast. COMPARISON:  CT HEAD May 22, 2018 FINDINGS: MRI HEAD FINDINGS INTRACRANIAL CONTENTS: No reduced diffusion to suggest acute ischemia. No susceptibility artifact to suggest hemorrhage. The ventricles and sulci are normal for patient's age. Scattered patchy supratentorial white matter FLAIR T2 hyperintensities compatible with mild chronic small vessel ischemic changes, less than expected for age. No suspicious parenchymal signal, masses, mass effect. No abnormal extra-axial fluid collections. No extra-axial masses. VASCULAR: Normal major intracranial vascular flow voids present at skull  base. SKULL AND UPPER CERVICAL SPINE: No abnormal sellar expansion. No suspicious calvarial bone marrow signal. Craniocervical junction maintained. SINUSES/ORBITS: Small sphenoid sinus air-fluid levels. Maxillary sinus mucosal thickening.The included ocular globes and orbital contents are non-suspicious. Status post bilateral ocular lens implants. OTHER: None. MRA HEAD FINDINGS ANTERIOR CIRCULATION: Normal flow related enhancement of the included cervical, petrous, cavernous and supraclinoid internal carotid arteries. Lobulated contour bilateral ICA seen with atherosclerosis, fibromuscular dysplasia or artifact. Mildly ectatic RIGHT cervical internal carotid artery. Ectatic LEFT cavernous internal carotid artery with 2 mm blister aneurysm versus infundibulum posteriorly directed (series 81,  image 91). Patent anterior communicating artery. 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 RIGHT A1 2 junction 1.5 mm inferiorly directed outpouching. Patent anterior and middle cerebral arteries. Tandem severe stenosis LEFT ACA. Severe stenosis LEFT M2 origin. Additional moderate tandem stenosis bilateral MCA consistent with atherosclerosis. No large vessel occlusion. POSTERIOR CIRCULATION: RIGHT vertebral artery is dominant. Vertebrobasilar arteries are patent, with normal flow related enhancement of the main branch vessels. Patent posterior cerebral arteries. Tandem severe RIGHT-greater-than-LEFT stenosis bilateral PCA. No large vessel occlusion,  aneurysm. ANATOMIC VARIANTS: None. Source images and MIP images were reviewed. IMPRESSION: MRI HEAD: 1. Negative noncontrast MRI head. MRA HEAD: 1. No emergent large vessel occlusion. 2. Multifocal severe stenosis anterior and posterior circulation compatible with atherosclerosis. 3. Three aneurysms anterior circulation measuring less than 2 mm. Electronically Signed   By: Elon Alas M.D.   On: 05/22/2018 15:13    Microbiology: No results found for this or any previous visit  (from the past 240 hour(s)).   Labs: Basic Metabolic Panel: Recent Labs  Lab 05/22/18 0124 05/24/18 0329  NA 137 138  K 4.0 3.9  CL 98 101  CO2 29 26  GLUCOSE 109* 105*  BUN 30* 32*  CREATININE 1.63* 1.77*  CALCIUM 9.4 9.0   Liver Function Tests: Recent Labs  Lab 05/22/18 0124  AST 23  ALT 16  ALKPHOS 87  BILITOT 0.5  PROT 7.1  ALBUMIN 3.8   No results for input(s): LIPASE, AMYLASE in the last 168 hours. No results for input(s): AMMONIA in the last 168 hours. CBC: Recent Labs  Lab 05/22/18 0124 05/24/18 0329  WBC 7.5 6.3  NEUTROABS 4.4 3.5  HGB 12.9 12.4  HCT 38.7 38.3  MCV 92.4 93.2  PLT 218 189   Cardiac Enzymes: No results for input(s): CKTOTAL, CKMB, CKMBINDEX, TROPONINI in the last 168 hours. BNP: BNP (last 3 results) No results for input(s): BNP in the last 8760 hours.  ProBNP (last 3 results) No results for input(s): PROBNP in the last 8760 hours.  CBG: No results for input(s): GLUCAP in the last 168 hours.     Signed:  Alma Friendly, MD [facility] 05/24/2018, 12:22 PM

## 2018-05-24 NOTE — Progress Notes (Signed)
Discharged to go home.  Discharge instructions and prescription information given.

## 2018-05-24 NOTE — Progress Notes (Addendum)
STROKE TEAM PROGRESS NOTE  INTERVAL HISTORY Pt up in chair. dtr at bedside. Feels vision is slightly improved. She is aware of addition of plavix x 3 weeks. She has been on multiple statins in the past, all with muscle aches and pains - so she came off. She was considered for PSCK-9 in the past and denied.    Vitals:   05/23/18 2128 05/24/18 0428 05/24/18 0758 05/24/18 0759  BP: (!) 112/47 (!) 145/54    Pulse: 62 68    Resp: 17 17    Temp: 98 F (36.7 C) 97.9 F (36.6 C)    TempSrc: Oral Oral    SpO2: 93% 96% 96% 96%  Weight:      Height:        CBC:  Recent Labs  Lab 05/22/18 0124 05/24/18 0329  WBC 7.5 6.3  NEUTROABS 4.4 3.5  HGB 12.9 12.4  HCT 38.7 38.3  MCV 92.4 93.2  PLT 218 622    Basic Metabolic Panel:  Recent Labs  Lab 05/22/18 0124 05/24/18 0329  NA 137 138  K 4.0 3.9  CL 98 101  CO2 29 26  GLUCOSE 109* 105*  BUN 30* 32*  CREATININE 1.63* 1.77*  CALCIUM 9.4 9.0   Lipid Panel:     Component Value Date/Time   CHOL 363 (H) 05/23/2018 0856   TRIG 227 (H) 05/23/2018 0856   HDL 61 05/23/2018 0856   CHOLHDL 6.0 05/23/2018 0856   VLDL 45 (H) 05/23/2018 0856   LDLCALC 257 (H) 05/23/2018 0856   HgbA1c:  Lab Results  Component Value Date   HGBA1C 6.0 (H) 05/23/2018   IMAGING Mr Brain Wo Contrast  Result Date: 05/22/2018 CLINICAL DATA:  LEFT vision changes since last night. History of hypertension, hyperlipidemia, sick sinus syndrome. EXAM: MRI HEAD WITHOUT CONTRAST MRA HEAD WITHOUT CONTRAST TECHNIQUE: Multiplanar, multiecho pulse sequences of the brain and surrounding structures were obtained without intravenous contrast. Angiographic images of the head were obtained using MRA technique without contrast. COMPARISON:  CT HEAD May 22, 2018 FINDINGS: MRI HEAD FINDINGS INTRACRANIAL CONTENTS: No reduced diffusion to suggest acute ischemia. No susceptibility artifact to suggest hemorrhage. The ventricles and sulci are normal for patient's age. Scattered patchy  supratentorial white matter FLAIR T2 hyperintensities compatible with mild chronic small vessel ischemic changes, less than expected for age. No suspicious parenchymal signal, masses, mass effect. No abnormal extra-axial fluid collections. No extra-axial masses. VASCULAR: Normal major intracranial vascular flow voids present at skull base. SKULL AND UPPER CERVICAL SPINE: No abnormal sellar expansion. No suspicious calvarial bone marrow signal. Craniocervical junction maintained. SINUSES/ORBITS: Small sphenoid sinus air-fluid levels. Maxillary sinus mucosal thickening.The included ocular globes and orbital contents are non-suspicious. Status post bilateral ocular lens implants. OTHER: None. MRA HEAD FINDINGS ANTERIOR CIRCULATION: Normal flow related enhancement of the included cervical, petrous, cavernous and supraclinoid internal carotid arteries. Lobulated contour bilateral ICA seen with atherosclerosis, fibromuscular dysplasia or artifact. Mildly ectatic RIGHT cervical internal carotid artery. Ectatic LEFT cavernous internal carotid artery with 2 mm blister aneurysm versus infundibulum posteriorly directed (series 81, image 91). Patent anterior communicating artery. 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 RIGHT A1 2 junction 1.5 mm inferiorly directed outpouching. Patent anterior and middle cerebral arteries. Tandem severe stenosis LEFT ACA. Severe stenosis LEFT M2 origin. Additional moderate tandem stenosis bilateral MCA consistent with atherosclerosis. No large vessel occlusion. POSTERIOR CIRCULATION: RIGHT vertebral artery is dominant. Vertebrobasilar arteries are patent, with normal flow related enhancement of the main branch vessels. Patent posterior cerebral arteries.  Tandem severe RIGHT-greater-than-LEFT stenosis bilateral PCA. No large vessel occlusion,  aneurysm. ANATOMIC VARIANTS: None. Source images and MIP images were reviewed. IMPRESSION: MRI HEAD: 1. Negative noncontrast MRI head. MRA HEAD: 1. No  emergent large vessel occlusion. 2. Multifocal severe stenosis anterior and posterior circulation compatible with atherosclerosis. 3. Three aneurysms anterior circulation measuring less than 2 mm. Electronically Signed   By: Elon Alas M.D.   On: 05/22/2018 15:13   Mr Jodene Nam Head Wo Contrast  Result Date: 05/22/2018 CLINICAL DATA:  LEFT vision changes since last night. History of hypertension, hyperlipidemia, sick sinus syndrome. EXAM: MRI HEAD WITHOUT CONTRAST MRA HEAD WITHOUT CONTRAST TECHNIQUE: Multiplanar, multiecho pulse sequences of the brain and surrounding structures were obtained without intravenous contrast. Angiographic images of the head were obtained using MRA technique without contrast. COMPARISON:  CT HEAD May 22, 2018 FINDINGS: MRI HEAD FINDINGS INTRACRANIAL CONTENTS: No reduced diffusion to suggest acute ischemia. No susceptibility artifact to suggest hemorrhage. The ventricles and sulci are normal for patient's age. Scattered patchy supratentorial white matter FLAIR T2 hyperintensities compatible with mild chronic small vessel ischemic changes, less than expected for age. No suspicious parenchymal signal, masses, mass effect. No abnormal extra-axial fluid collections. No extra-axial masses. VASCULAR: Normal major intracranial vascular flow voids present at skull base. SKULL AND UPPER CERVICAL SPINE: No abnormal sellar expansion. No suspicious calvarial bone marrow signal. Craniocervical junction maintained. SINUSES/ORBITS: Small sphenoid sinus air-fluid levels. Maxillary sinus mucosal thickening.The included ocular globes and orbital contents are non-suspicious. Status post bilateral ocular lens implants. OTHER: None. MRA HEAD FINDINGS ANTERIOR CIRCULATION: Normal flow related enhancement of the included cervical, petrous, cavernous and supraclinoid internal carotid arteries. Lobulated contour bilateral ICA seen with atherosclerosis, fibromuscular dysplasia or artifact. Mildly ectatic  RIGHT cervical internal carotid artery. Ectatic LEFT cavernous internal carotid artery with 2 mm blister aneurysm versus infundibulum posteriorly directed (series 81, image 91). Patent anterior communicating artery. 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 RIGHT A1 2 junction 1.5 mm inferiorly directed outpouching. Patent anterior and middle cerebral arteries. Tandem severe stenosis LEFT ACA. Severe stenosis LEFT M2 origin. Additional moderate tandem stenosis bilateral MCA consistent with atherosclerosis. No large vessel occlusion. POSTERIOR CIRCULATION: RIGHT vertebral artery is dominant. Vertebrobasilar arteries are patent, with normal flow related enhancement of the main branch vessels. Patent posterior cerebral arteries. Tandem severe RIGHT-greater-than-LEFT stenosis bilateral PCA. No large vessel occlusion,  aneurysm. ANATOMIC VARIANTS: None. Source images and MIP images were reviewed. IMPRESSION: MRI HEAD: 1. Negative noncontrast MRI head. MRA HEAD: 1. No emergent large vessel occlusion. 2. Multifocal severe stenosis anterior and posterior circulation compatible with atherosclerosis. 3. Three aneurysms anterior circulation measuring less than 2 mm. Electronically Signed   By: Elon Alas M.D.   On: 05/22/2018 15:13   Bilateral carotid duplex Left ICA 40-59% stenosis.  Antegrade vertebral flow bilaterally.  Vessel tortuosity noted.   2D Echocardiogram  - Left ventricle: The cavity size was normal. There was mild concentric hypertrophy. Systolic function was normal. The estimated ejection fraction was in the range of 60% to 65%. Wall motion was normal; there were no regional wall motion abnormalities. Doppler parameters are consistent with abnormal left ventricular relaxation (grade 1 diastolic dysfunction). Doppler parameters are consistent with indeterminate ventricular filling pressure. - Aortic valve: Valve mobility was restricted. Transvalvular velocity was within the normal range. There was no  stenosis.  There was no regurgitation. - Mitral valve: Transvalvular velocity was within the normal range. There was no evidence for stenosis. There was trivial regurgitation. - Right ventricle: The  cavity size was normal. Wall thickness was normal. Systolic function was normal. - Tricuspid valve: There was mild regurgitation. - Pulmonary arteries: Systolic pressure was within the normal range. PA peak pressure: 34 mm Hg (S). - Pericardium, extracardiac: A trivial pericardial effusion was identified.  PHYSICAL EXAM Pleasant elderly lady currently not in distress. . Afebrile. Head is nontraumatic. Neck is supple without bruit.    Cardiac exam no murmur or gallop. Lungs are clear to auscultation. Distal pulses are well felt. Neurological Exam :  Awake alert oriented 3 with normal speech and language function. Extraocular moments are full range without nystagmus. Left pupil is 2 mm and reactive. Fundi could not be visualized. She has barely minimum light perception in the left eye which is also patchy. Normal vision in the right eye. Face is symmetric without weakness. Tongue is midline. Motor system exam reveals symmetric upper and lower extremity strength without drift or focal weakness. Deep tendon reflexes are 1+ symmetric plantars downgoing. Sensation intact. Gait not tested.  ASSESSMENT/PLAN Ms. [place] ERI MCEVERS is a 84 y.o. female with history of AF not on AC d/t falls, CAD, CAS, DVT in pregnancy, HTN, HLD, sick sinus syndrome presenting with L eye vision loss.   L CRAO in setting of  L ICA stenosis and AF not on AC  CT head No acute stroke. Small vessel disease. Atrophy.     MRI  negative  MRA  No ELVO. Mult anterior and posterior severe stenosis d/t atherosclerosis. 3 anterior circulation aneurysms  Carotid Doppler  L ICA 50-69% per prior to arrival testing; here L ICA 40-59%  2D Echo  EF 60-65%. No source of embolus   LDL 257  HgbA1c 6.0  SCDs for VTE prophylaxis  aspirin 81 mg  daily prior to admission, now on aspirin 81 mg daily and clopidogrel 75 mg daily. Continue.aspirin and Plavix and to 3 weeks followed by aspirin alone  Therapy recommendations:  HH PT, supervision recommended   Disposition:  pending   VVS consulted - pt not interested in pursing   No neuro follow up indicated  Stroke will sign off  Atrial Fibrillation  Home anticoagulation:  none d/t hx falls   Cerebral Aneurysms  3 in anterior circulation  Incidental findings, no intervention  Hypertension  Stable . BP goal normotensive  Hyperlipidemia Statin related myositis  Home meds:  No statin  LDL 257  Intolerant to all statins in the past - myalgias  Reconsider PSCK-9 with Dr. Sallyanne Kuster  Other Stroke Risk Factors  Advanced age  84 / ETOH level not performed   Hx DVT during pregnancy  Other Active Problems  Dry eyes, on restasis  Hx esophageal tear, told she cannot be intubated - ? Surg candidate  CKD stage III Cr 1.63  Hospital day # 1  Burnetta Sabin, MSN, APRN, ANVP-BC, AGPCNP-BC Advanced [facility] Stroke Nurse [place] for Schedule & Pager information 05/24/2018 10:56 AM    Discussed with patient and Dr. Donzetta Matters vascular surgery.recommend dual antiplatelet therapy of aspirin and Plavix together for 3 weeks followed by aspirin alone . RecommendPCSK 9 inhibitor injections for her hyperlipidemia.stroke team will sign off. Kindly call for questions. Antony Contras, MD Medical Director [facility] Stroke Center Pager: [phone] 05/24/2018 10:56 AM  To contact Stroke Continuity provider, please refer to [email or web address] After hours, contact General Neurology

## 2018-05-24 NOTE — Progress Notes (Signed)
Physical Therapy Treatment Patient Details Name: Amy [name] MRN: [id number] DOB: [birth year] Today's Date: 05/24/2018    History of Present Illness Amy [name] is a 84 y.o. female with medical history significant for CAD with prior CABG, CKD stage III, hypertension, hypothyroidism, sick sinus syndrome status post permanent pacemaker placement, dyslipidemia, and GERD who presented to the emergency department with vision changes; likely central retinal artery occlusion/branch retinal artery occlusion per Neuro consult    PT Comments    Pt making steady progress towards achieving her current functional mobility goals. Pt tolerated short distance ambulation this session with RW; however, very limited secondary to fatigue and SOB. Pt on RA throughout with SPO2 maintaining at 93-94%. Pt's BP was assessed in sitting and standing and remained stable. Pt initially with some reports of dizziness upon standing which dissipated with time and ambulation. Pt would continue to benefit from skilled physical therapy services at this time while admitted and after d/c to address the below listed limitations in order to improve overall safety and independence with functional mobility.    Follow Up Recommendations  Home health PT;Supervision/Assistance - 24 hour     Equipment Recommendations  Wheelchair (measurements PT);Wheelchair cushion (measurements PT)    Recommendations for Other Services       Precautions / Restrictions Precautions Precautions: Fall Precaution Comments: monitor BP Restrictions Weight Bearing Restrictions: No    Mobility  Bed Mobility               General bed mobility comments: pt OOB in recliner chair upon arrival  Transfers Overall transfer level: Needs assistance Equipment used: Rolling walker (2 wheeled) Transfers: Sit to/from Stand Sit to Stand: Min guard         General transfer comment: pt performed x1 from recliner chair and x1 from EOB; min guard for  safety  Ambulation/Gait Ambulation/Gait assistance: Min guard Gait Distance (Feet): 30 Feet(30' + 10' more after sitting rest break) Assistive device: Rolling walker (2 wheeled) Gait Pattern/deviations: Step-through pattern;Decreased stride length;Trunk flexed Gait velocity: decreased Gait velocity interpretation: <1.31 ft/sec, indicative of household ambulator General Gait Details: pt limited secondary to SOB and fatigue; pt on RA throughout with SPO2 maintaining at 93-94%. pt steady with RW, no LOB or need for physical assistance, min guard for safety   Stairs             Wheelchair Mobility    Modified Rankin (Stroke Patients Only) Modified Rankin (Stroke Patients Only) Pre-Morbid Rankin Score: Slight disability Modified Rankin: Moderate disability     Balance Overall balance assessment: Needs assistance Sitting-balance support: No upper extremity supported;Feet supported Sitting balance-Leahy Scale: Fair     Standing balance support: Bilateral upper extremity supported;During functional activity Standing balance-Leahy Scale: Poor                              Cognition Arousal/Alertness: Awake/alert Behavior During Therapy: WFL for tasks assessed/performed Overall Cognitive Status: Within Functional Limits for tasks assessed                                        Exercises      General Comments        Pertinent Vitals/Pain Pain Assessment: No/denies pain    Home Living  Prior Function            PT Goals (current goals can now be found in the care plan section) Acute Rehab PT Goals PT Goal Formulation: With patient Time For Goal Achievement: 06/06/18 Potential to Achieve Goals: Good Progress towards PT goals: Progressing toward goals    Frequency    Min 3X/week      PT Plan Current plan remains appropriate    Co-evaluation              AM-PAC PT "6 Clicks" Daily Activity   Outcome Measure  Difficulty turning over in bed (including adjusting bedclothes, sheets and blankets)?: None Difficulty moving from lying on back to sitting on the side of the bed? : None Difficulty sitting down on and standing up from a chair with arms (e.g., wheelchair, bedside commode, etc,.)?: Unable Help needed moving to and from a bed to chair (including a wheelchair)?: A Little Help needed walking in hospital room?: A Little Help needed climbing 3-5 steps with a railing? : Total 6 Click Score: 16    End of Session Equipment Utilized During Treatment: Gait belt Activity Tolerance: Patient limited by fatigue Patient left: in chair;with call bell/phone within reach;with family/visitor present Nurse Communication: Mobility status PT Visit Diagnosis: Other abnormalities of gait and mobility (R26.89)     Time: 4920-1007 PT Time Calculation (min) (ACUTE ONLY): 20 min  Charges:  $Gait Training: 8-22 mins                    G Codes:       [place], [place], [place] 05/24/2018, 10:56 AM

## 2018-05-25 DIAGNOSIS — Z950 Presence of cardiac pacemaker: Secondary | ICD-10-CM | POA: Diagnosis not present

## 2018-05-25 DIAGNOSIS — Z7902 Long term (current) use of antithrombotics/antiplatelets: Secondary | ICD-10-CM | POA: Diagnosis not present

## 2018-05-25 DIAGNOSIS — I2510 Atherosclerotic heart disease of native coronary artery without angina pectoris: Secondary | ICD-10-CM | POA: Diagnosis not present

## 2018-05-25 DIAGNOSIS — Z96642 Presence of left artificial hip joint: Secondary | ICD-10-CM | POA: Diagnosis not present

## 2018-05-25 DIAGNOSIS — F419 Anxiety disorder, unspecified: Secondary | ICD-10-CM | POA: Diagnosis not present

## 2018-05-25 DIAGNOSIS — Z8781 Personal history of (healed) traumatic fracture: Secondary | ICD-10-CM | POA: Diagnosis not present

## 2018-05-25 DIAGNOSIS — I6522 Occlusion and stenosis of left carotid artery: Secondary | ICD-10-CM | POA: Diagnosis not present

## 2018-05-25 DIAGNOSIS — E039 Hypothyroidism, unspecified: Secondary | ICD-10-CM | POA: Diagnosis not present

## 2018-05-25 DIAGNOSIS — N183 Chronic kidney disease, stage 3 (moderate): Secondary | ICD-10-CM | POA: Diagnosis not present

## 2018-05-25 DIAGNOSIS — Z7982 Long term (current) use of aspirin: Secondary | ICD-10-CM | POA: Diagnosis not present

## 2018-05-25 DIAGNOSIS — H3412 Central retinal artery occlusion, left eye: Secondary | ICD-10-CM | POA: Diagnosis not present

## 2018-05-25 DIAGNOSIS — I480 Paroxysmal atrial fibrillation: Secondary | ICD-10-CM | POA: Diagnosis not present

## 2018-05-25 DIAGNOSIS — Z951 Presence of aortocoronary bypass graft: Secondary | ICD-10-CM | POA: Diagnosis not present

## 2018-05-25 DIAGNOSIS — T466X5S Adverse effect of antihyperlipidemic and antiarteriosclerotic drugs, sequela: Secondary | ICD-10-CM | POA: Diagnosis not present

## 2018-05-25 DIAGNOSIS — G720 Drug-induced myopathy: Secondary | ICD-10-CM | POA: Diagnosis not present

## 2018-05-25 DIAGNOSIS — M1990 Unspecified osteoarthritis, unspecified site: Secondary | ICD-10-CM | POA: Diagnosis not present

## 2018-05-25 DIAGNOSIS — J449 Chronic obstructive pulmonary disease, unspecified: Secondary | ICD-10-CM | POA: Diagnosis not present

## 2018-05-25 DIAGNOSIS — I5032 Chronic diastolic (congestive) heart failure: Secondary | ICD-10-CM | POA: Diagnosis not present

## 2018-05-25 DIAGNOSIS — I495 Sick sinus syndrome: Secondary | ICD-10-CM | POA: Diagnosis not present

## 2018-05-25 DIAGNOSIS — K219 Gastro-esophageal reflux disease without esophagitis: Secondary | ICD-10-CM | POA: Diagnosis not present

## 2018-05-25 DIAGNOSIS — I130 Hypertensive heart and chronic kidney disease with heart failure and stage 1 through stage 4 chronic kidney disease, or unspecified chronic kidney disease: Secondary | ICD-10-CM | POA: Diagnosis not present

## 2018-05-25 DIAGNOSIS — E785 Hyperlipidemia, unspecified: Secondary | ICD-10-CM | POA: Diagnosis not present

## 2018-05-25 NOTE — Consult Note (Signed)
            [facility] CM Primary Care Navigator  05/25/2018  Amy [name] Nov 10, 1921 [id number]   Went to seepatient at the bedside to identify possible discharge needs butshe wasalreadydischargedper staff. Per MD note, patient was discharged home with home health services.  Per chart review,patientwas admitted for further management of acute painless monocular vision lossdescribed as a film over left eye. (Vision loss of left eye) Ophthalmology consulted and recommend outpatient follow-up.  Patient has discharge instruction to schedule a follow-up appointment as soon as possible for a visit withprimary care provider in 1 week.   For additional questions please contact:  Edwena Felty A. Jasmia Angst, BSN, RN-BC [facility] PRIMARY CARE Navigator Cell: [phone]

## 2018-05-28 DIAGNOSIS — R42 Dizziness and giddiness: Secondary | ICD-10-CM | POA: Diagnosis not present

## 2018-05-28 DIAGNOSIS — N184 Chronic kidney disease, stage 4 (severe): Secondary | ICD-10-CM | POA: Diagnosis not present

## 2018-05-28 DIAGNOSIS — Z09 Encounter for follow-up examination after completed treatment for conditions other than malignant neoplasm: Secondary | ICD-10-CM | POA: Diagnosis not present

## 2018-05-28 DIAGNOSIS — I5030 Unspecified diastolic (congestive) heart failure: Secondary | ICD-10-CM | POA: Diagnosis not present

## 2018-05-28 DIAGNOSIS — Z6828 Body mass index (BMI) 28.0-28.9, adult: Secondary | ICD-10-CM | POA: Diagnosis not present

## 2018-05-28 DIAGNOSIS — J449 Chronic obstructive pulmonary disease, unspecified: Secondary | ICD-10-CM | POA: Diagnosis not present

## 2018-05-28 DIAGNOSIS — H53133 Sudden visual loss, bilateral: Secondary | ICD-10-CM | POA: Diagnosis not present

## 2018-05-28 DIAGNOSIS — E7800 Pure hypercholesterolemia, unspecified: Secondary | ICD-10-CM | POA: Diagnosis not present

## 2018-05-28 DIAGNOSIS — I2510 Atherosclerotic heart disease of native coronary artery without angina pectoris: Secondary | ICD-10-CM | POA: Diagnosis not present

## 2018-05-29 DIAGNOSIS — H40013 Open angle with borderline findings, low risk, bilateral: Secondary | ICD-10-CM | POA: Diagnosis not present

## 2018-05-29 DIAGNOSIS — H35371 Puckering of macula, right eye: Secondary | ICD-10-CM | POA: Diagnosis not present

## 2018-05-29 DIAGNOSIS — H3412 Central retinal artery occlusion, left eye: Secondary | ICD-10-CM | POA: Diagnosis not present

## 2018-05-29 DIAGNOSIS — I5032 Chronic diastolic (congestive) heart failure: Secondary | ICD-10-CM | POA: Diagnosis not present

## 2018-05-29 DIAGNOSIS — I130 Hypertensive heart and chronic kidney disease with heart failure and stage 1 through stage 4 chronic kidney disease, or unspecified chronic kidney disease: Secondary | ICD-10-CM | POA: Diagnosis not present

## 2018-05-29 DIAGNOSIS — I480 Paroxysmal atrial fibrillation: Secondary | ICD-10-CM | POA: Diagnosis not present

## 2018-05-29 DIAGNOSIS — J449 Chronic obstructive pulmonary disease, unspecified: Secondary | ICD-10-CM | POA: Diagnosis not present

## 2018-05-29 DIAGNOSIS — H04123 Dry eye syndrome of bilateral lacrimal glands: Secondary | ICD-10-CM | POA: Diagnosis not present

## 2018-05-29 DIAGNOSIS — I2510 Atherosclerotic heart disease of native coronary artery without angina pectoris: Secondary | ICD-10-CM | POA: Diagnosis not present

## 2018-05-29 DIAGNOSIS — H34232 Retinal artery branch occlusion, left eye: Secondary | ICD-10-CM | POA: Diagnosis not present

## 2018-05-30 ENCOUNTER — Other Ambulatory Visit: Payer: Self-pay

## 2018-05-30 DIAGNOSIS — I6523 Occlusion and stenosis of bilateral carotid arteries: Secondary | ICD-10-CM

## 2018-05-30 DIAGNOSIS — J449 Chronic obstructive pulmonary disease, unspecified: Secondary | ICD-10-CM | POA: Diagnosis not present

## 2018-05-30 DIAGNOSIS — I130 Hypertensive heart and chronic kidney disease with heart failure and stage 1 through stage 4 chronic kidney disease, or unspecified chronic kidney disease: Secondary | ICD-10-CM | POA: Diagnosis not present

## 2018-05-30 DIAGNOSIS — I2510 Atherosclerotic heart disease of native coronary artery without angina pectoris: Secondary | ICD-10-CM | POA: Diagnosis not present

## 2018-05-30 DIAGNOSIS — H3412 Central retinal artery occlusion, left eye: Secondary | ICD-10-CM | POA: Diagnosis not present

## 2018-05-30 DIAGNOSIS — I480 Paroxysmal atrial fibrillation: Secondary | ICD-10-CM | POA: Diagnosis not present

## 2018-05-30 DIAGNOSIS — I5032 Chronic diastolic (congestive) heart failure: Secondary | ICD-10-CM | POA: Diagnosis not present

## 2018-05-31 ENCOUNTER — Other Ambulatory Visit: Payer: Self-pay

## 2018-05-31 DIAGNOSIS — I130 Hypertensive heart and chronic kidney disease with heart failure and stage 1 through stage 4 chronic kidney disease, or unspecified chronic kidney disease: Secondary | ICD-10-CM | POA: Diagnosis not present

## 2018-05-31 DIAGNOSIS — J449 Chronic obstructive pulmonary disease, unspecified: Secondary | ICD-10-CM | POA: Diagnosis not present

## 2018-05-31 DIAGNOSIS — I5032 Chronic diastolic (congestive) heart failure: Secondary | ICD-10-CM | POA: Diagnosis not present

## 2018-05-31 DIAGNOSIS — I480 Paroxysmal atrial fibrillation: Secondary | ICD-10-CM | POA: Diagnosis not present

## 2018-05-31 DIAGNOSIS — I2510 Atherosclerotic heart disease of native coronary artery without angina pectoris: Secondary | ICD-10-CM | POA: Diagnosis not present

## 2018-05-31 DIAGNOSIS — H3412 Central retinal artery occlusion, left eye: Secondary | ICD-10-CM | POA: Diagnosis not present

## 2018-06-04 DIAGNOSIS — I480 Paroxysmal atrial fibrillation: Secondary | ICD-10-CM | POA: Diagnosis not present

## 2018-06-04 DIAGNOSIS — I130 Hypertensive heart and chronic kidney disease with heart failure and stage 1 through stage 4 chronic kidney disease, or unspecified chronic kidney disease: Secondary | ICD-10-CM | POA: Diagnosis not present

## 2018-06-04 DIAGNOSIS — I2510 Atherosclerotic heart disease of native coronary artery without angina pectoris: Secondary | ICD-10-CM | POA: Diagnosis not present

## 2018-06-04 DIAGNOSIS — H43813 Vitreous degeneration, bilateral: Secondary | ICD-10-CM | POA: Diagnosis not present

## 2018-06-04 DIAGNOSIS — H3412 Central retinal artery occlusion, left eye: Secondary | ICD-10-CM | POA: Diagnosis not present

## 2018-06-04 DIAGNOSIS — J449 Chronic obstructive pulmonary disease, unspecified: Secondary | ICD-10-CM | POA: Diagnosis not present

## 2018-06-04 DIAGNOSIS — H34232 Retinal artery branch occlusion, left eye: Secondary | ICD-10-CM | POA: Diagnosis not present

## 2018-06-04 DIAGNOSIS — I5032 Chronic diastolic (congestive) heart failure: Secondary | ICD-10-CM | POA: Diagnosis not present

## 2018-06-04 DIAGNOSIS — H35033 Hypertensive retinopathy, bilateral: Secondary | ICD-10-CM | POA: Diagnosis not present

## 2018-06-04 DIAGNOSIS — H35371 Puckering of macula, right eye: Secondary | ICD-10-CM | POA: Diagnosis not present

## 2018-06-07 DIAGNOSIS — I130 Hypertensive heart and chronic kidney disease with heart failure and stage 1 through stage 4 chronic kidney disease, or unspecified chronic kidney disease: Secondary | ICD-10-CM | POA: Diagnosis not present

## 2018-06-07 DIAGNOSIS — I5032 Chronic diastolic (congestive) heart failure: Secondary | ICD-10-CM | POA: Diagnosis not present

## 2018-06-07 DIAGNOSIS — I480 Paroxysmal atrial fibrillation: Secondary | ICD-10-CM | POA: Diagnosis not present

## 2018-06-07 DIAGNOSIS — I2510 Atherosclerotic heart disease of native coronary artery without angina pectoris: Secondary | ICD-10-CM | POA: Diagnosis not present

## 2018-06-07 DIAGNOSIS — J449 Chronic obstructive pulmonary disease, unspecified: Secondary | ICD-10-CM | POA: Diagnosis not present

## 2018-06-07 DIAGNOSIS — H3412 Central retinal artery occlusion, left eye: Secondary | ICD-10-CM | POA: Diagnosis not present

## 2018-06-12 DIAGNOSIS — I130 Hypertensive heart and chronic kidney disease with heart failure and stage 1 through stage 4 chronic kidney disease, or unspecified chronic kidney disease: Secondary | ICD-10-CM | POA: Diagnosis not present

## 2018-06-12 DIAGNOSIS — I2510 Atherosclerotic heart disease of native coronary artery without angina pectoris: Secondary | ICD-10-CM | POA: Diagnosis not present

## 2018-06-12 DIAGNOSIS — H3412 Central retinal artery occlusion, left eye: Secondary | ICD-10-CM | POA: Diagnosis not present

## 2018-06-12 DIAGNOSIS — I480 Paroxysmal atrial fibrillation: Secondary | ICD-10-CM | POA: Diagnosis not present

## 2018-06-12 DIAGNOSIS — J449 Chronic obstructive pulmonary disease, unspecified: Secondary | ICD-10-CM | POA: Diagnosis not present

## 2018-06-12 DIAGNOSIS — I5032 Chronic diastolic (congestive) heart failure: Secondary | ICD-10-CM | POA: Diagnosis not present

## 2018-06-14 DIAGNOSIS — H3412 Central retinal artery occlusion, left eye: Secondary | ICD-10-CM | POA: Diagnosis not present

## 2018-06-14 DIAGNOSIS — J449 Chronic obstructive pulmonary disease, unspecified: Secondary | ICD-10-CM | POA: Diagnosis not present

## 2018-06-14 DIAGNOSIS — I480 Paroxysmal atrial fibrillation: Secondary | ICD-10-CM | POA: Diagnosis not present

## 2018-06-14 DIAGNOSIS — I2510 Atherosclerotic heart disease of native coronary artery without angina pectoris: Secondary | ICD-10-CM | POA: Diagnosis not present

## 2018-06-14 DIAGNOSIS — I130 Hypertensive heart and chronic kidney disease with heart failure and stage 1 through stage 4 chronic kidney disease, or unspecified chronic kidney disease: Secondary | ICD-10-CM | POA: Diagnosis not present

## 2018-06-14 DIAGNOSIS — I5032 Chronic diastolic (congestive) heart failure: Secondary | ICD-10-CM | POA: Diagnosis not present

## 2018-06-18 DIAGNOSIS — H53133 Sudden visual loss, bilateral: Secondary | ICD-10-CM | POA: Diagnosis not present

## 2018-06-18 DIAGNOSIS — I480 Paroxysmal atrial fibrillation: Secondary | ICD-10-CM | POA: Diagnosis not present

## 2018-06-18 DIAGNOSIS — I5032 Chronic diastolic (congestive) heart failure: Secondary | ICD-10-CM | POA: Diagnosis not present

## 2018-06-18 DIAGNOSIS — J069 Acute upper respiratory infection, unspecified: Secondary | ICD-10-CM | POA: Diagnosis not present

## 2018-06-18 DIAGNOSIS — R05 Cough: Secondary | ICD-10-CM | POA: Diagnosis not present

## 2018-06-18 DIAGNOSIS — N184 Chronic kidney disease, stage 4 (severe): Secondary | ICD-10-CM | POA: Diagnosis not present

## 2018-06-18 DIAGNOSIS — Z950 Presence of cardiac pacemaker: Secondary | ICD-10-CM | POA: Diagnosis not present

## 2018-06-18 DIAGNOSIS — Z09 Encounter for follow-up examination after completed treatment for conditions other than malignant neoplasm: Secondary | ICD-10-CM | POA: Diagnosis not present

## 2018-06-18 DIAGNOSIS — R300 Dysuria: Secondary | ICD-10-CM | POA: Diagnosis not present

## 2018-06-18 DIAGNOSIS — H547 Unspecified visual loss: Secondary | ICD-10-CM | POA: Diagnosis not present

## 2018-06-18 DIAGNOSIS — I5030 Unspecified diastolic (congestive) heart failure: Secondary | ICD-10-CM | POA: Diagnosis not present

## 2018-06-18 DIAGNOSIS — R001 Bradycardia, unspecified: Secondary | ICD-10-CM | POA: Diagnosis not present

## 2018-06-18 DIAGNOSIS — J449 Chronic obstructive pulmonary disease, unspecified: Secondary | ICD-10-CM | POA: Diagnosis not present

## 2018-06-18 DIAGNOSIS — I6529 Occlusion and stenosis of unspecified carotid artery: Secondary | ICD-10-CM | POA: Diagnosis not present

## 2018-06-18 DIAGNOSIS — E7800 Pure hypercholesterolemia, unspecified: Secondary | ICD-10-CM | POA: Diagnosis not present

## 2018-06-18 DIAGNOSIS — I2510 Atherosclerotic heart disease of native coronary artery without angina pectoris: Secondary | ICD-10-CM | POA: Diagnosis not present

## 2018-06-18 DIAGNOSIS — M6281 Muscle weakness (generalized): Secondary | ICD-10-CM | POA: Diagnosis not present

## 2018-06-18 DIAGNOSIS — I5042 Chronic combined systolic (congestive) and diastolic (congestive) heart failure: Secondary | ICD-10-CM | POA: Diagnosis not present

## 2018-06-20 DIAGNOSIS — I2510 Atherosclerotic heart disease of native coronary artery without angina pectoris: Secondary | ICD-10-CM | POA: Diagnosis not present

## 2018-06-20 DIAGNOSIS — I480 Paroxysmal atrial fibrillation: Secondary | ICD-10-CM | POA: Diagnosis not present

## 2018-06-20 DIAGNOSIS — J449 Chronic obstructive pulmonary disease, unspecified: Secondary | ICD-10-CM | POA: Diagnosis not present

## 2018-06-20 DIAGNOSIS — H3412 Central retinal artery occlusion, left eye: Secondary | ICD-10-CM | POA: Diagnosis not present

## 2018-06-20 DIAGNOSIS — I130 Hypertensive heart and chronic kidney disease with heart failure and stage 1 through stage 4 chronic kidney disease, or unspecified chronic kidney disease: Secondary | ICD-10-CM | POA: Diagnosis not present

## 2018-06-20 DIAGNOSIS — I5032 Chronic diastolic (congestive) heart failure: Secondary | ICD-10-CM | POA: Diagnosis not present

## 2018-06-26 ENCOUNTER — Encounter: Payer: Self-pay | Admitting: Cardiovascular Disease

## 2018-06-26 ENCOUNTER — Ambulatory Visit (INDEPENDENT_AMBULATORY_CARE_PROVIDER_SITE_OTHER): Payer: Medicare Other | Admitting: Cardiovascular Disease

## 2018-06-26 VITALS — BP 128/70 | HR 88 | Ht 65.5 in | Wt 173.6 lb

## 2018-06-26 DIAGNOSIS — I5032 Chronic diastolic (congestive) heart failure: Secondary | ICD-10-CM

## 2018-06-26 DIAGNOSIS — Z950 Presence of cardiac pacemaker: Secondary | ICD-10-CM

## 2018-06-26 DIAGNOSIS — I6522 Occlusion and stenosis of left carotid artery: Secondary | ICD-10-CM | POA: Diagnosis not present

## 2018-06-26 DIAGNOSIS — I6523 Occlusion and stenosis of bilateral carotid arteries: Secondary | ICD-10-CM | POA: Diagnosis not present

## 2018-06-26 DIAGNOSIS — I495 Sick sinus syndrome: Secondary | ICD-10-CM | POA: Diagnosis not present

## 2018-06-26 DIAGNOSIS — I2510 Atherosclerotic heart disease of native coronary artery without angina pectoris: Secondary | ICD-10-CM | POA: Diagnosis not present

## 2018-06-26 DIAGNOSIS — I4892 Unspecified atrial flutter: Secondary | ICD-10-CM | POA: Diagnosis not present

## 2018-06-26 DIAGNOSIS — I10 Essential (primary) hypertension: Secondary | ICD-10-CM | POA: Diagnosis not present

## 2018-06-26 MED ORDER — APIXABAN 2.5 MG PO TABS: 2.5000 mg | ORAL_TABLET | Freq: Two times a day (BID) | ORAL | 5 refills | Status: DC

## 2018-06-26 NOTE — Patient Instructions (Addendum)
Dr Sallyanne Kuster has recommended making the following medication changes: 1. START Eliquis 2.5 mg twice daily 2. STOP Aspirin 3. STOP Clopidogrel  Your physician recommends that you schedule a follow-up appointment in 6 weeks with a NP/PA.  Dr Sallyanne Kuster recommends that you schedule a follow-up appointment first available.  If you need a refill on your cardiac medications before your next appointment, please call your pharmacy.

## 2018-06-26 NOTE — Progress Notes (Signed)
Cardiology Office Note    Date:  06/26/2018   ID:  Amy [name], DOB [birth year], MRN [id number]  PCP:  Celene Squibb, MD  Cardiologist:   Sanda Klein, MD   Chief complaint: Pacemaker check, coronary artery disease, syncope   History of Present Illness:  Amy [name] is a 84 y.o. female with history of coronary artery disease and previous bypass surgery as well as sinus node dysfunction and paroxysmal atrial arrhythmia; she is accompanied by her daughter.  2 weeks after her last office visit she was hospitalized with sudden left-sided monocular vision loss.  Her pacemaker was not interrogated.  Her carotid Doppler study showed a moderate stenosis in the ipsilateral internal carotid.  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  Has not had any recent episodes of syncope or fall.  She still has vision field loss in her left eye, involving the lower half of the visual field (unilateral inferior hemianopsia).  No other neurological signs have occurred since.  She has not had any recent bleeding problems.  Over the last year she has had a couple of small hemorrhoidal bleeds and an episode of epistaxis.  She has not had any angina pectoris.  At home her weight has been relatively steady in the range of 170-177 pounds.  She has chronic exertional dyspnea NYHA functional class II-III.  She is actually quite active for the her age.  She has not had any problems with edema.  She denies orthopnea or PND.  She does not complain of palpitations.  Interrogation of her pacemaker shows that she is in atrial fibrillation today.  She is completely unaware of the arrhythmia.  Started just a few minutes before her office appointment.  Over the last several weeks she has had numerous brief episodes of paroxysmal atrial fibrillation.  Hard to say if they were temporally associated with her pacemaker function is otherwise  normal with an estimated longevity of 13.4 years.  There is 67% atrial pacing and virtually no ventricular pacing.  Activity level has diminished over the last month from about 2 hours to about 1 hour.  Lead parameters remain excellent and the battery is essentially at beginning of life since her generator change March 2019 (13.4 years estimated longevity).  The last truly lengthy episode of atrial fibrillation occurred in April about 6 hours, although 1-2-hour episodes were also seen in June and July.  Overall burden of atrial fibrillation remains less than 0.1%.  In 2009 she had an acute inferior wall ST segment elevation myocardial infarction treated with urgent angioplasty and placement of a bare-metal stent. Subsequently, she underwent multivessel bypass surgery with LIMA to LAD, SVG to ramus SVG to RCA. She has no evidence of residual myocardial injury and has preserved left ventricular systolic function. In June 2015, cardiac catheterization showed 90% proximal and mid LAD with patent distal LIMA bypass; 40% ostial circumflex, not bypassed; unchanged 80% proximal ramus intermedius, SVG to ramus occluded; patent stent right coronary artery, SVG to right coronary artery occluded. Her LVEF was 55%, there was no significant mitral regurgitation and the LVEDP was high normal at 15 mm Hg, (when she was short of breath). [name] stated that her dyspnea is likely multifactorial and cannot be explained completely by cardiac findings. In May 2016 she had a low risk nuclear study.  In 2010 a permanent pacemaker (Medtronic) was implanted for symptomatic bradycardia.  She  underwent a generator change out in March 2019.  She has paroxysmal atrial fibrillation and atrial flutter, but she is not taking anticoagulants to fall/bleeding risk. She does take aspirin.   In late 2013 she had a fall complicated by a hip fracture and radius fracture. She had an esophageal perforation complicated by mediastinitis and had a  feeding tube for several months. In October 2015 she had another fall in her home (no syncope, tripped at [facility].carpet transition) and fractured her pelvis and humerus.   She has fairly severe hyperlipidemia with an LDL cholesterol in excess of 200. She is statin, niacin, ezetimibe and resin intolerant. She has discussed PSCK9 inhibitors in the past but has decided not to take these.  She has systolic HTN, but also a tendency to marked orthostatic hypotension (25 mm Hg drop in SBP).  She has moderate CKD, baseline GFR around 30 mL/minute, has seen Dr. Lowanda Foster in the past, but does not have regular nephrology follow-up.  July 2019 she had an episode of unilateral inferior hemianopsia.  Brief episodes of atrial fibrillation were seen around that time.  There was also a moderate left internal carotid artery stenosis.  Past Medical History:  Diagnosis Date  . Anxiety   . Arthritis   . Asthma   . CAD in native artery    s/p CABG x 3; Last Myoview in 07/2009 - non-ischemic; Echo 03/2012  Aortic Sclerosis with normal EF.  . CKD (chronic kidney disease), stage III ([place])   . DVT (deep vein thrombosis) in pregnancy ([place])   . Esophageal perforation    9/13  . Fall 07/21/2014   FALL WITH INJURY  . Fracture of head of humerus   . Fracture of hip ([place]) 07/21/2014   LEFT  . GERD (gastroesophageal reflux disease)   . Hypercholesteremia   . Hypertension   . Hypothyroidism   . Loss of vision 05/22/2018   LEFT EYE  . Mediastinitis    s/p drainage  . Myocardial infarction [facility])    Inferior STEMI 07/2008 - PCI of prox RCA followed byu CABG x 3 in 9/'09  . Pacemaker   . PAF (paroxysmal atrial fibrillation) ([place])   . Paroxysmal atrial flutter ([place]) 11/26/2014  . Pneumonia   . Shortness of breath   . SSS (sick sinus syndrome) ([place]) 11/26/2014  . Status post hip hemiarthroplasty left hip by Dr Ronnie Derby 08/08/2012    Past Surgical History:  Procedure Laterality Date  . CARPAL TUNNEL RELEASE   08/08/2012   Procedure: CARPAL TUNNEL RELEASE;  Surgeon: Schuyler Amor, MD;  Location: [place];  Service: Orthopedics;  Laterality: Left;  . CORONARY ARTERY BYPASS GRAFT  2009   LIMA-LAD, SVG-RI, SVG-stented RCA  . ESOPHAGOGASTRODUODENOSCOPY  08/10/2012   Procedure: ESOPHAGOGASTRODUODENOSCOPY (EGD);  Surgeon: Beryle Beams, MD;  Location: [facility] ENDOSCOPY;  Service: Endoscopy;  Laterality: N/A;  . ESOPHAGOSCOPY  08/10/2012   Procedure: ESOPHAGOSCOPY;  Surgeon: [name], MD;  Location: [place];  Service: ENT;  Laterality: N/A;  . GASTROSTOMY  08/16/2012   Procedure: GASTROSTOMY;  Surgeon: Zenovia Jarred, MD;  Location: [place];  Service: General;  Laterality: N/A;  . HIP ARTHROPLASTY  08/08/2012   Procedure: ARTHROPLASTY BIPOLAR HIP;  Surgeon: Rudean Haskell, MD;  Location: [place];  Service: Orthopedics;  Laterality: Left;  [place]   . JOINT REPLACEMENT    . LEFT HEART CATHETERIZATION WITH CORONARY ANGIOGRAM N/A 04/16/2014   Procedure: LEFT HEART CATHETERIZATION WITH CORONARY ANGIOGRAM;  Surgeon: Wellington Hampshire,  MD;  Location: [place] CATH LAB;  Service: Cardiovascular;  Laterality: N/A;  . NM MYOCAR PERF WALL MOTION  08/14/2009   Normal  . PACEMAKER INSERTION  01/06/2009   Medtronic  . PPM GENERATOR CHANGEOUT N/A 01/24/2018   Procedure: PPM GENERATOR CHANGEOUT;  Surgeon: Sanda Klein, MD;  Location: [place] CV LAB;  Service: Cardiovascular;  Laterality: N/A;  . TOTAL HIP ARTHROPLASTY    . WRIST FRACTURE SURGERY  07/2012   left intra articular  with carpel tunnel    Current Medications: Outpatient Medications Prior to Visit  Medication Sig Dispense Refill  . acetaminophen (TYLENOL) 500 MG tablet Take 1,000 mg by mouth every 6 (six) hours as needed for moderate pain.    Marland Kitchen albuterol (PROVENTIL HFA;VENTOLIN HFA) 108 (90 Base) MCG/ACT inhaler Inhale 2 puffs into the lungs every 6 (six) hours as needed for wheezing or shortness of breath.     . allopurinol (ZYLOPRIM) 100 MG tablet Take 100  mg by mouth daily.  2  . aspirin EC 81 MG tablet Take 81 mg by mouth daily.    . Cholecalciferol (VITAMIN D3 PO) Take 1 tablet by mouth daily.    . clopidogrel (PLAVIX) 75 MG tablet Take 1 tablet by mouth daily.    . cycloSPORINE (RESTASIS) 0.05 % ophthalmic emulsion Place 1 drop into both eyes 2 (two) times daily.    Marland Kitchen docusate sodium (COLACE) 100 MG capsule Take 100 mg by mouth 2 (two) times daily.    . Fluticasone-Umeclidin-Vilant (TRELEGY ELLIPTA) 100-62.5-25 MCG/INH AEPB Inhale 1 puff into the lungs daily.     . furosemide (LASIX) 40 MG tablet Take 20 mg by mouth.    . Lactobacillus-Inulin (CULTURELLE DIGESTIVE HEALTH PO) Take 1 capsule by mouth daily.     Marland Kitchen levothyroxine (SYNTHROID, LEVOTHROID) 50 MCG tablet Take 50 mcg by mouth daily before breakfast.     . LORazepam (ATIVAN) 1 MG tablet Take 1 mg by mouth at bedtime as needed for anxiety or sleep.     . metoprolol succinate (TOPROL XL) 25 MG 24 hr tablet Take 1 tablet (25 mg total) by mouth daily. 90 tablet 3  . Misc Natural Products (TART CHERRY ADVANCED PO) Take 1 tablet by mouth daily.    . nitroGLYCERIN (NITROSTAT) 0.4 MG SL tablet Place 1 tablet (0.4 mg total) under the tongue every 5 (five) minutes x 3 doses as needed for chest pain. 25 tablet 12  . omeprazole (PRILOSEC) 40 MG capsule TAKE ONE CAPSULE BY MOUTH ONCE DAILY (Patient taking differently: TAKE 40 MG BY MOUTH ONCE DAILY) 90 capsule 3  . potassium chloride (K-DUR) 10 MEQ tablet Take 1 tablet (10 mEq total) by mouth daily as directed. (Patient taking differently: Take 10 mEq by mouth daily. ) 45 tablet 1  . ranitidine (ZANTAC) 150 MG tablet TAKE ONE TABLET BY MOUTH ONCE DAILY AT BEDTIME (Patient taking differently: TAKE 150 MG BY MOUTH ONCE DAILY AT BEDTIME) 90 tablet 3  . REPATHA SURECLICK 914 MG/ML SOAJ INJECT ONE INJECTION EVERY 2 WEEKS  5  . Wheat Dextrin (BENEFIBER DRINK MIX PO) Take 1 Dose by mouth 2 (two) times daily as needed (constipation).    . furosemide (LASIX) 40  MG tablet TAKE 1 TABLET BY MOUTH ONCE DAILY AS DIRECTED 90 tablet 1   No facility-administered medications prior to visit.      Allergies:   Colestipol; Hydrocodone-acetaminophen; Niacin and related; Statins; and Zetia [ezetimibe]   Social History   Socioeconomic History  . Marital  status: Widowed    Spouse name: Not on file  . Number of children: Not on file  . Years of education: Not on file  . Highest education level: Not on file  Occupational History  . Not on file  Social Needs  . Financial resource strain: Not on file  . Food insecurity:    Worry: Not on file    Inability: Not on file  . Transportation needs:    Medical: Not on file    Non-medical: Not on file  Tobacco Use  . Smoking status: Never Smoker  . Smokeless tobacco: Never Used  Substance and Sexual Activity  . Alcohol use: No  . Drug use: No  . Sexual activity: Not Currently  Lifestyle  . Physical activity:    Days per week: Not on file    Minutes per session: Not on file  . Stress: Not on file  Relationships  . Social connections:    Talks on phone: Not on file    Gets together: Not on file    Attends religious service: Not on file    Active member of club or organization: Not on file    Attends meetings of clubs or organizations: Not on file    Relationship status: Not on file  Other Topics Concern  . Not on file  Social History Narrative  . Not on file     Family History:  The patient's family history includes Stroke in her father.   ROS:   Please see the history of present illness.    ROS All other systems reviewed and are negative.   PHYSICAL EXAM:   VS:  BP 128/70   Pulse 88   Ht 5' 5.5" (1.664 m)   Wt 173 lb 9.6 oz (78.7 kg)   SpO2 94%   BMI 28.45 kg/m      General: Alert, oriented x3, no distress Head: no evidence of trauma, PERRL, EOMI, no exophtalmos or lid lag, no myxedema, no xanthelasma; normal ears, nose and oropharynx Neck: normal jugular venous pulsations and no  hepatojugular reflux; brisk carotid pulses without delay and no carotid bruits Chest: clear to auscultation, no signs of consolidation by percussion or palpation, normal fremitus, symmetrical and full respiratory excursions Cardiovascular: normal position and quality of the apical impulse, regular rhythm, normal first and second heart sounds, no murmurs, rubs or gallops Abdomen: no tenderness or distention, no masses by palpation, no abnormal pulsatility or arterial bruits, normal bowel sounds, no hepatosplenomegaly Extremities: no clubbing, cyanosis or edema; 2+ radial, ulnar and brachial pulses bilaterally; 2+ right femoral, posterior tibial and dorsalis pedis pulses; 2+ left femoral, posterior tibial and dorsalis pedis pulses; no subclavian or femoral bruits Neurological: grossly nonfocal.  Inferior field cut, left eye Psych: Normal mood and affect   Wt Readings from Last 3 Encounters:  06/26/18 173 lb 9.6 oz (78.7 kg)  05/22/18 175 lb (79.4 kg)  05/07/18 175 lb 9.6 oz (79.7 kg)    Studies/Labs Reviewed:   EKG:  EKG is not ordered today.  ECG performed on June 14 is a poor tracing with very severe baseline artifact.  It shows atrial paced, ventricular sensed rhythm with QS pattern in leads V1-V3 but without acute ischemic changes. Lipid Panel    Component Value Date/Time   CHOL 363 (H) 05/23/2018 0856   TRIG 227 (H) 05/23/2018 0856   HDL 61 05/23/2018 0856   CHOLHDL 6.0 05/23/2018 0856   VLDL 45 (H) 05/23/2018 0856   LDLCALC 257 (  H) 05/23/2018 0856   BMET    Component Value Date/Time   NA 138 05/24/2018 0329   NA 141 01/10/2018 1433   K 3.9 05/24/2018 0329   CL 101 05/24/2018 0329   CO2 26 05/24/2018 0329   GLUCOSE 105 (H) 05/24/2018 0329   BUN 32 (H) 05/24/2018 0329   BUN 31 01/10/2018 1433   CREATININE 1.77 (H) 05/24/2018 0329   CREATININE 1.19 (H) 03/05/2015 1157   CALCIUM 9.0 05/24/2018 0329   GFRNONAA 23 (L) 05/24/2018 0329   GFRAA 27 (L) 05/24/2018 0329     ASSESSMENT:    1. Paroxysmal atrial flutter ([place])   2. Chronic diastolic heart failure ([place])   3. Coronary artery disease involving native coronary artery of native heart without angina pectoris   4. SSS (sick sinus syndrome) ([place])   5. Essential hypertension   6. Pacemaker   7. Left carotid stenosis      PLAN:  In order of problems listed above:  1. AFlutter/fib: Her intracardiac electrogram today shows very rapid atrial flutter versus coarse atrial fibrillation with well-controlled ventricular response.  She is completely unaware of it.  Numerous episodes have occurred but all of them brief so that the overall burden is less than 0.1%.  Although it is hard to say with certainty, my suspicion is this is the most likely cause for her recent vision loss via a retinal artery embolization.  CHADSVasc 8 (age 41, CVA 2, gender, HF, PAD/CAD, HTN).  Although I previously considered her too elderly and frail to start oral anticoagulation, the recent neurological event changes the factors of the equation and I think she should start anticoagulants.  Recommend Eliquis, dose adjusted for age and reduced renal function.  Pros and cons of bleeding complications, management of bleeding events and perioperative management discussed.  She is unaware of the arrhythmia.  Antiarrhythmics are not indicated. Stop ASA and plavix. 2. CHF: Clinically euvolemic, good functional status, well underneath the weight of 180 pounds where we have recommended increased diuretic use. 3. CAD s/p CABG: Asymptomatic/no angina.  Has just started on Repatha and so far no side effects from the medication.  The cost is high. 4. SSS: Roughly 65% atrial pacing with appropriate heart rate histograms for her level of activity. 5. HTN: Blood pressure control is very good. "Perfect" blood pressure control should be avoided, since this has led to orthostatic hypotension and falls in the past.  I think we should tolerate blood pressure  recordings in the 140s and 150s to avoid falls from orthostatic hypotension. 6. PPM: Normal device function.  Remote downloads every 3 months and yearly office visits. 7. Moderate stenosis L ICA: Has a follow-up visit with Dr. Donzetta Matters but she is not a good candidate for carotid endarterectomy    Medication Adjustments/Labs and Tests Ordered: Current medicines are reviewed at length with the patient today.  Concerns regarding medicines are outlined above.  Medication changes, Labs and Tests ordered today are listed in the Patient Instructions below. Patient Instructions  Dr Sallyanne Kuster has recommended making the following medication changes: 1. START Eliquis 2.5 mg twice daily  Your physician recommends that you schedule a follow-up appointment in 6 weeks with a NP/PA.  Dr Sallyanne Kuster recommends that you schedule a follow-up appointment first available.  If you need a refill on your cardiac medications before your next appointment, please call your pharmacy.    Signed, Sanda Klein, MD  06/26/2018 9:06 AM    [facility] [id number]  [address], [place], [place]  [address] Phone: [phone]; Fax: [phone]

## 2018-07-05 ENCOUNTER — Other Ambulatory Visit: Payer: Self-pay | Admitting: Cardiovascular Disease

## 2018-07-06 ENCOUNTER — Other Ambulatory Visit: Payer: Self-pay | Admitting: Cardiovascular Disease

## 2018-07-19 DIAGNOSIS — H35371 Puckering of macula, right eye: Secondary | ICD-10-CM | POA: Diagnosis not present

## 2018-07-19 DIAGNOSIS — H04123 Dry eye syndrome of bilateral lacrimal glands: Secondary | ICD-10-CM | POA: Diagnosis not present

## 2018-07-19 DIAGNOSIS — H40013 Open angle with borderline findings, low risk, bilateral: Secondary | ICD-10-CM | POA: Diagnosis not present

## 2018-07-19 DIAGNOSIS — H34232 Retinal artery branch occlusion, left eye: Secondary | ICD-10-CM | POA: Diagnosis not present

## 2018-07-25 ENCOUNTER — Ambulatory Visit (INDEPENDENT_AMBULATORY_CARE_PROVIDER_SITE_OTHER): Payer: Medicare Other | Admitting: Internal Medicine

## 2018-07-25 ENCOUNTER — Encounter (INDEPENDENT_AMBULATORY_CARE_PROVIDER_SITE_OTHER): Payer: Self-pay | Admitting: Internal Medicine

## 2018-07-25 VITALS — BP 150/82 | HR 76 | Temp 98.0°F | Ht 65.0 in | Wt 173.0 lb

## 2018-07-25 DIAGNOSIS — I6523 Occlusion and stenosis of bilateral carotid arteries: Secondary | ICD-10-CM

## 2018-07-25 DIAGNOSIS — K219 Gastro-esophageal reflux disease without esophagitis: Secondary | ICD-10-CM | POA: Diagnosis not present

## 2018-07-25 DIAGNOSIS — K5909 Other constipation: Secondary | ICD-10-CM

## 2018-07-25 NOTE — Patient Instructions (Addendum)
Continue the Zantac. OV in 1 year.  Try 1/2 scoop of Miralax as needed.

## 2018-07-25 NOTE — Progress Notes (Signed)
Subjective:    Patient ID: Amy [name], female    DOB: 1922/03/02, 84 y.o.   MRN: [id number]  HPI Here today for f/u. Last seen in September of 2018.  Hx of chronic GERD.  She tells me she is doing good. She has stopped the Zantac.  She has been taking Tums as needed and Prilosec. She is trying to change the position of her sleeping.  She is watching her diet. She has noticed when has GERD, it is when she has turned on her stomach.  Her appetite is good. She has gained 6 pounds since her lat visit.  Her BMs are normal.  No melena or BRRB. She says she has some constipation.  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   Review of Systems Past Medical History:  Diagnosis Date  . Anxiety   . Arthritis   . Asthma   . CAD in native artery    s/p CABG x 3; Last Myoview in 07/2009 - non-ischemic; Echo 03/2012  Aortic Sclerosis with normal EF.  . CKD (chronic kidney disease), stage III ([place])   . DVT (deep vein thrombosis) in pregnancy ([place])   . Esophageal perforation    9/13  . Fall 07/21/2014   FALL WITH INJURY  . Fracture of head of humerus   . Fracture of hip ([place]) 07/21/2014   LEFT  . GERD (gastroesophageal reflux disease)   . Hypercholesteremia   . Hypertension   . Hypothyroidism   . Loss of vision 05/22/2018   LEFT EYE  . Mediastinitis    s/p drainage  . Myocardial infarction [facility])    Inferior STEMI 07/2008 - PCI of prox RCA followed byu CABG x 3 in 9/'09  . Pacemaker   . PAF (paroxysmal atrial fibrillation) ([place])   . Paroxysmal atrial flutter ([place]) 11/26/2014  . Pneumonia   . Shortness of breath   . SSS (sick sinus syndrome) ([place]) 11/26/2014  . Status post hip hemiarthroplasty left hip by Dr Ronnie Derby 08/08/2012    Past Surgical History:  Procedure Laterality Date  . CARPAL TUNNEL RELEASE  08/08/2012   Procedure: CARPAL TUNNEL RELEASE;  Surgeon: Schuyler Amor, MD;  Location: [place];  Service: Orthopedics;  Laterality: Left;  . CORONARY ARTERY BYPASS GRAFT   2009   LIMA-LAD, SVG-RI, SVG-stented RCA  . ESOPHAGOGASTRODUODENOSCOPY  08/10/2012   Procedure: ESOPHAGOGASTRODUODENOSCOPY (EGD);  Surgeon: Beryle Beams, MD;  Location: [facility] ENDOSCOPY;  Service: Endoscopy;  Laterality: N/A;  . ESOPHAGOSCOPY  08/10/2012   Procedure: ESOPHAGOSCOPY;  Surgeon: [name], MD;  Location: [place];  Service: ENT;  Laterality: N/A;  . GASTROSTOMY  08/16/2012   Procedure: GASTROSTOMY;  Surgeon: Zenovia Jarred, MD;  Location: [place];  Service: General;  Laterality: N/A;  . HIP ARTHROPLASTY  08/08/2012   Procedure: ARTHROPLASTY BIPOLAR HIP;  Surgeon: Rudean Haskell, MD;  Location: [place];  Service: Orthopedics;  Laterality: Left;  [place]   . JOINT REPLACEMENT    . LEFT HEART CATHETERIZATION WITH CORONARY ANGIOGRAM N/A 04/16/2014   Procedure: LEFT HEART CATHETERIZATION WITH CORONARY ANGIOGRAM;  Surgeon: Wellington Hampshire, MD;  Location: [place] CATH LAB;  Service: Cardiovascular;  Laterality: N/A;  . NM MYOCAR PERF WALL MOTION  08/14/2009   Normal  . PACEMAKER INSERTION  01/06/2009   Medtronic  . PPM GENERATOR CHANGEOUT N/A 01/24/2018   Procedure: PPM GENERATOR CHANGEOUT;  Surgeon: Sanda Klein, MD;  Location: [place] CV LAB;  Service: Cardiovascular;  Laterality: N/A;  . TOTAL HIP ARTHROPLASTY    . WRIST FRACTURE SURGERY  07/2012   left intra articular  with carpel tunnel    Allergies  Allergen Reactions  . Colestipol Other (See Comments)    Muscle aches  . Hydrocodone-Acetaminophen Itching and Other (See Comments)    Tolerates tylenol  . Niacin And Related Other (See Comments)    Unknown  . Statins Other (See Comments)    Muscle aches on all she has tried.  She recalls Lipitor, Crestor, and Livalo but thinks there were others  . Zetia [Ezetimibe] Other (See Comments)    Muscle aches    Current Outpatient Medications on File Prior to Visit  Medication Sig Dispense Refill  . acetaminophen (TYLENOL) 500 MG tablet Take 1,000 mg by mouth every 6 (six) hours  as needed for moderate pain.    Marland Kitchen albuterol (PROVENTIL HFA;VENTOLIN HFA) 108 (90 Base) MCG/ACT inhaler Inhale 2 puffs into the lungs every 6 (six) hours as needed for wheezing or shortness of breath.     . allopurinol (ZYLOPRIM) 100 MG tablet Take 100 mg by mouth daily.  2  . apixaban (ELIQUIS) 2.5 MG TABS tablet Take 1 tablet (2.5 mg total) by mouth 2 (two) times daily. 60 tablet 5  . Cholecalciferol (VITAMIN D3 PO) Take 1 tablet by mouth daily.    . cycloSPORINE (RESTASIS) 0.05 % ophthalmic emulsion Place 1 drop into both eyes 2 (two) times daily.    Marland Kitchen docusate sodium (COLACE) 100 MG capsule Take 100 mg by mouth 2 (two) times daily.    . Fluticasone-Umeclidin-Vilant (TRELEGY ELLIPTA) 100-62.5-25 MCG/INH AEPB Inhale 1 puff into the lungs daily.     . furosemide (LASIX) 40 MG tablet Take 20 mg by mouth.    . Lactobacillus-Inulin (CULTURELLE DIGESTIVE HEALTH PO) Take 1 capsule by mouth daily.     Marland Kitchen levothyroxine (SYNTHROID, LEVOTHROID) 50 MCG tablet Take 50 mcg by mouth daily before breakfast.     . LORazepam (ATIVAN) 1 MG tablet Take 1 mg by mouth at bedtime as needed for anxiety or sleep.     . metoprolol succinate (TOPROL XL) 25 MG 24 hr tablet Take 1 tablet (25 mg total) by mouth daily. 90 tablet 3  . Misc Natural Products (TART CHERRY ADVANCED PO) Take 1 tablet by mouth daily.    . nitroGLYCERIN (NITROSTAT) 0.4 MG SL tablet Place 1 tablet (0.4 mg total) under the tongue every 5 (five) minutes x 3 doses as needed for chest pain. 25 tablet 12  . omeprazole (PRILOSEC) 40 MG capsule TAKE ONE CAPSULE BY MOUTH ONCE DAILY (Patient taking differently: TAKE 40 MG BY MOUTH ONCE DAILY) 90 capsule 3  . potassium chloride (K-DUR) 10 MEQ tablet Take 1 tablet (10 mEq total) by mouth daily. 90 tablet 1  . ranitidine (ZANTAC) 150 MG tablet TAKE ONE TABLET BY MOUTH ONCE DAILY AT BEDTIME (Patient taking differently: TAKE 150 MG BY MOUTH ONCE DAILY AT BEDTIME) 90 tablet 3  . REPATHA SURECLICK 956 MG/ML SOAJ INJECT  ONE INJECTION EVERY 2 WEEKS  5  . Wheat Dextrin (BENEFIBER DRINK MIX PO) Take 1 Dose by mouth 2 (two) times daily as needed (constipation).     No current facility-administered medications on file prior to visit.         Objective:   Physical Exam Blood pressure (!) 150/82, pulse 76, temperature 98 F (36.7 C), height 5\' 5"  (1.651 m), weight 173 lb (78.5 kg).  Alert and oriented. Skin warm  and dry. Oral mucosa is moist.   . Sclera anicteric, conjunctivae is pink. Thyroid not enlarged. No cervical lymphadenopathy. Lungs clear. Heart regular rate and rhythm.  Abdomen is soft. Bowel sounds are positive. No hepatomegaly. No abdominal masses felt. No tenderness.  No edema to lower extremities.          Assessment & Plan:  GERD. Continue the Zantac. OV in 1 year.  Constipation: Try Miralax 1/2 scoop daily.

## 2018-07-26 ENCOUNTER — Telehealth (INDEPENDENT_AMBULATORY_CARE_PROVIDER_SITE_OTHER): Payer: Self-pay | Admitting: Internal Medicine

## 2018-07-26 NOTE — Telephone Encounter (Signed)
Please call patient at [phone]

## 2018-07-26 NOTE — Telephone Encounter (Signed)
I have spoken with patient 

## 2018-08-04 ENCOUNTER — Other Ambulatory Visit (INDEPENDENT_AMBULATORY_CARE_PROVIDER_SITE_OTHER): Payer: Self-pay | Admitting: Internal Medicine

## 2018-08-04 DIAGNOSIS — K219 Gastro-esophageal reflux disease without esophagitis: Secondary | ICD-10-CM

## 2018-08-06 ENCOUNTER — Telehealth: Payer: Self-pay

## 2018-08-06 ENCOUNTER — Ambulatory Visit (INDEPENDENT_AMBULATORY_CARE_PROVIDER_SITE_OTHER): Payer: Medicare Other | Admitting: *Deleted

## 2018-08-06 DIAGNOSIS — I495 Sick sinus syndrome: Secondary | ICD-10-CM

## 2018-08-06 NOTE — Telephone Encounter (Signed)
Spoke with pt and reminded pt of remote transmission that is due today. Pt verbalized understanding.   

## 2018-08-08 NOTE — Progress Notes (Signed)
Remote pacemaker transmission.   

## 2018-08-09 ENCOUNTER — Ambulatory Visit (INDEPENDENT_AMBULATORY_CARE_PROVIDER_SITE_OTHER): Payer: Medicare Other | Admitting: Physician Assistant

## 2018-08-09 ENCOUNTER — Encounter: Payer: Self-pay | Admitting: Physician Assistant

## 2018-08-09 VITALS — BP 142/70 | HR 77 | Ht 65.5 in | Wt 172.4 lb

## 2018-08-09 DIAGNOSIS — I480 Paroxysmal atrial fibrillation: Secondary | ICD-10-CM | POA: Diagnosis not present

## 2018-08-09 DIAGNOSIS — Z950 Presence of cardiac pacemaker: Secondary | ICD-10-CM | POA: Diagnosis not present

## 2018-08-09 DIAGNOSIS — E039 Hypothyroidism, unspecified: Secondary | ICD-10-CM | POA: Diagnosis not present

## 2018-08-09 DIAGNOSIS — E785 Hyperlipidemia, unspecified: Secondary | ICD-10-CM | POA: Diagnosis not present

## 2018-08-09 DIAGNOSIS — R7303 Prediabetes: Secondary | ICD-10-CM | POA: Diagnosis not present

## 2018-08-09 DIAGNOSIS — I10 Essential (primary) hypertension: Secondary | ICD-10-CM

## 2018-08-09 DIAGNOSIS — N183 Chronic kidney disease, stage 3 unspecified: Secondary | ICD-10-CM

## 2018-08-09 DIAGNOSIS — I6523 Occlusion and stenosis of bilateral carotid arteries: Secondary | ICD-10-CM

## 2018-08-09 DIAGNOSIS — I25810 Atherosclerosis of coronary artery bypass graft(s) without angina pectoris: Secondary | ICD-10-CM

## 2018-08-09 NOTE — Patient Instructions (Addendum)
Medication Instructions:  Your physician recommends that you continue on your current medications as directed. Please refer to the Current Medication list given to you today.  If you need a refill on your cardiac medications before your next appointment, please call your pharmacy.   Lab work: Your physician recommends that you return for lab work in: CMET, CBC, [place] If you have labs (blood work) drawn today and your tests are completely normal, you will receive your results only by: Marland Kitchen MyChart Message (if you have MyChart) OR . A paper copy in the mail If you have any lab test that is abnormal or we need to change your treatment, we will call you to review the results.  Testing/Procedures: NONE   Follow-Up: At [facility], you and your health needs are our priority.  As part of our continuing mission to provide you with exceptional heart care, we have created designated Provider Care Teams.  These Care Teams include your primary Cardiologist (physician) and Advanced Practice Providers (APPs -  Physician Assistants and Nurse Practitioners) who all work together to provide you with the care you need, when you need it. FOLLOW UP AS SCHEDULED  Any Other Special Instructions Will Be Listed Below (If Applicable). REPATHA SAMPLES GIVEN/NO ELIQUIS SAMPLES AVAILABLE

## 2018-08-09 NOTE — Progress Notes (Addendum)
Cardiology Office Note    Date:  08/09/2018   ID:  Amy [name], DOB 10-18-1922, MRN [id number]  PCP:  Celene Squibb, MD  Cardiologist:  Dr. Sallyanne Kuster   Chief Complaint  Patient presents with  . Follow-up    seen for Dr. Sallyanne Kuster    History of Present Illness:  Amy [name] is a 84 y.o. female with PMH of CAD s/p CABG, sinus node dysfunction, PAF, CKD stage III, remote DVT during previous pregnancy, carotid artery disease, HTN, HLD, prediabetes and hypothyroidism.  Patient had a inferior STEMI in 2009 and underwent urgent angioplasty and placement of bare-metal stent.  Subsequently he underwent bypass surgery with LIMA to LAD, SVG to ramus, SVG to RCA.  Her EF was preserved at the time.  Cardiac catheterization in June 2015 showed 90% proximal to mid LAD lesion, patent distal LIMA bypass, 40% ostial left circumflex disease, unchanged 80% proximal ramus lesion, occluded SVG to ramus, patent stent in the RCA, occluded SVG to RCA.  EF was 55%.  In May 2016, she had a low risk nuclear study finding.  She underwent Medtronic permanent pacemaker placement in 2010 for symptomatic bradycardia and underwent generator change out in March 2019.  During the last office visit by Dr. Sallyanne Kuster in August 2019, device interrogation shows she was in atrial fibrillation.  She does not have any cardiac awareness of irregular rhythm.  She had numerous episodes of atrial fibrillation recently.  Overall burden is less than 0.1%.  Previously she was not on systemic anticoagulation due to fall risk.  However she had a recent episode of vision loss due to retinal artery embolization.  This episode was felt to be related to atrial fibrillation.  After discussing with the patient, she was placed on Eliquis.   Patient presents today with her daughter for cardiology office visit.  She has not had any issue on Eliquis.  She denies any blood in the stool or blood in the urine.  She has stopped the aspirin Plavix.  At this time,  she does not have any significant issue.  She denies any chest pain, shortness of breath, lower extremity edema, orthopnea or PND.  She is doing very well for her age.  She did hit the donut hole and request samples for the Eliquis and Repatha.  She will need CMP, CBC and fasting lipid panel.  Note, her last creatinine was trending up back in July.  Her last lipid panel obtained in July 2019 continue to show quite elevated triglyceride and also LDL of 257.    Past Medical History:  Diagnosis Date  . Anxiety   . Arthritis   . Asthma   . CAD in native artery    s/p CABG x 3; Last Myoview in 07/2009 - non-ischemic; Echo 03/2012  Aortic Sclerosis with normal EF.  . CKD (chronic kidney disease), stage III ([place])   . DVT (deep vein thrombosis) in pregnancy   . Esophageal perforation    9/13  . Fall 07/21/2014   FALL WITH INJURY  . Fracture of head of humerus   . Fracture of hip ([place]) 07/21/2014   LEFT  . GERD (gastroesophageal reflux disease)   . Hypercholesteremia   . Hypertension   . Hypothyroidism   . Loss of vision 05/22/2018   LEFT EYE  . Mediastinitis    s/p drainage  . Myocardial infarction [facility])    Inferior STEMI 07/2008 - PCI of prox RCA followed byu CABG x 3 in  9/'09  . Pacemaker   . PAF (paroxysmal atrial fibrillation) ([place])   . Paroxysmal atrial flutter ([place]) 11/26/2014  . Pneumonia   . Shortness of breath   . SSS (sick sinus syndrome) ([place]) 11/26/2014  . Status post hip hemiarthroplasty left hip by Dr Ronnie Derby 08/08/2012    Past Surgical History:  Procedure Laterality Date  . CARPAL TUNNEL RELEASE  08/08/2012   Procedure: CARPAL TUNNEL RELEASE;  Surgeon: Schuyler Amor, MD;  Location: [place];  Service: Orthopedics;  Laterality: Left;  . CORONARY ARTERY BYPASS GRAFT  2009   LIMA-LAD, SVG-RI, SVG-stented RCA  . ESOPHAGOGASTRODUODENOSCOPY  08/10/2012   Procedure: ESOPHAGOGASTRODUODENOSCOPY (EGD);  Surgeon: Beryle Beams, MD;  Location: [facility] ENDOSCOPY;  Service: Endoscopy;   Laterality: N/A;  . ESOPHAGOSCOPY  08/10/2012   Procedure: ESOPHAGOSCOPY;  Surgeon: [name], MD;  Location: [place];  Service: ENT;  Laterality: N/A;  . GASTROSTOMY  08/16/2012   Procedure: GASTROSTOMY;  Surgeon: Zenovia Jarred, MD;  Location: [place];  Service: General;  Laterality: N/A;  . HIP ARTHROPLASTY  08/08/2012   Procedure: ARTHROPLASTY BIPOLAR HIP;  Surgeon: Rudean Haskell, MD;  Location: [place];  Service: Orthopedics;  Laterality: Left;  [place]   . JOINT REPLACEMENT    . LEFT HEART CATHETERIZATION WITH CORONARY ANGIOGRAM N/A 04/16/2014   Procedure: LEFT HEART CATHETERIZATION WITH CORONARY ANGIOGRAM;  Surgeon: Wellington Hampshire, MD;  Location: [place] CATH LAB;  Service: Cardiovascular;  Laterality: N/A;  . NM MYOCAR PERF WALL MOTION  08/14/2009   Normal  . PACEMAKER INSERTION  01/06/2009   Medtronic  . PPM GENERATOR CHANGEOUT N/A 01/24/2018   Procedure: PPM GENERATOR CHANGEOUT;  Surgeon: Sanda Klein, MD;  Location: [place] CV LAB;  Service: Cardiovascular;  Laterality: N/A;  . TOTAL HIP ARTHROPLASTY    . WRIST FRACTURE SURGERY  07/2012   left intra articular  with carpel tunnel    Current Medications: Outpatient Medications Prior to Visit  Medication Sig Dispense Refill  . acetaminophen (TYLENOL) 500 MG tablet Take 1,000 mg by mouth every 6 (six) hours as needed for moderate pain.    Marland Kitchen albuterol (PROVENTIL HFA;VENTOLIN HFA) 108 (90 Base) MCG/ACT inhaler Inhale 2 puffs into the lungs every 6 (six) hours as needed for wheezing or shortness of breath.     . allopurinol (ZYLOPRIM) 100 MG tablet Take 100 mg by mouth daily.  2  . apixaban (ELIQUIS) 2.5 MG TABS tablet Take 1 tablet (2.5 mg total) by mouth 2 (two) times daily. 60 tablet 5  . Cholecalciferol (VITAMIN D3 PO) Take 1 tablet by mouth daily.    . cycloSPORINE (RESTASIS) 0.05 % ophthalmic emulsion Place 1 drop into both eyes 2 (two) times daily.    Marland Kitchen docusate sodium (COLACE) 100 MG capsule Take 100 mg by mouth 2 (two)  times daily.    . Fluticasone-Umeclidin-Vilant (TRELEGY ELLIPTA) 100-62.5-25 MCG/INH AEPB Inhale 1 puff into the lungs daily.     . furosemide (LASIX) 40 MG tablet Take 20 mg by mouth daily.     . Lactobacillus-Inulin (CULTURELLE DIGESTIVE HEALTH PO) Take 1 capsule by mouth daily.     Marland Kitchen levothyroxine (SYNTHROID, LEVOTHROID) 50 MCG tablet Take 50 mcg by mouth daily before breakfast.     . LORazepam (ATIVAN) 1 MG tablet Take 1 mg by mouth at bedtime as needed for anxiety or sleep.     . metoprolol succinate (TOPROL XL) 25 MG 24 hr tablet Take 1 tablet (25 mg total) by mouth daily.  90 tablet 3  . Misc Natural Products (TART CHERRY ADVANCED PO) Take 1 tablet by mouth daily.    . nitroGLYCERIN (NITROSTAT) 0.4 MG SL tablet Place 1 tablet (0.4 mg total) under the tongue every 5 (five) minutes x 3 doses as needed for chest pain. 25 tablet 12  . omeprazole (PRILOSEC) 40 MG capsule TAKE 1 CAPSULE BY MOUTH ONCE DAILY 90 capsule 3  . ondansetron (ZOFRAN) 4 MG tablet Take 1 tablet by mouth daily as needed.    . potassium chloride (K-DUR) 10 MEQ tablet Take 1 tablet (10 mEq total) by mouth daily. 90 tablet 1  . ranitidine (ZANTAC) 150 MG tablet TAKE ONE TABLET BY MOUTH ONCE DAILY AT BEDTIME (Patient taking differently: TAKE 150 MG BY MOUTH ONCE DAILY AT BEDTIME) 90 tablet 3  . REPATHA SURECLICK 967 MG/ML SOAJ INJECT ONE INJECTION EVERY 2 WEEKS  5  . Wheat Dextrin (BENEFIBER DRINK MIX PO) Take 1 Dose by mouth 2 (two) times daily as needed (constipation).     No facility-administered medications prior to visit.      Allergies:   Colestipol; Hydrocodone-acetaminophen; Niacin and related; Statins; and Zetia [ezetimibe]   Social History   Socioeconomic History  . Marital status: Widowed    Spouse name: Not on file  . Number of children: Not on file  . Years of education: Not on file  . Highest education level: Not on file  Occupational History  . Not on file  Social Needs  . Financial resource strain:  Not on file  . Food insecurity:    Worry: Not on file    Inability: Not on file  . Transportation needs:    Medical: Not on file    Non-medical: Not on file  Tobacco Use  . Smoking status: Never Smoker  . Smokeless tobacco: Never Used  Substance and Sexual Activity  . Alcohol use: No  . Drug use: No  . Sexual activity: Not Currently  Lifestyle  . Physical activity:    Days per week: Not on file    Minutes per session: Not on file  . Stress: Not on file  Relationships  . Social connections:    Talks on phone: Not on file    Gets together: Not on file    Attends religious service: Not on file    Active member of club or organization: Not on file    Attends meetings of clubs or organizations: Not on file    Relationship status: Not on file  Other Topics Concern  . Not on file  Social History Narrative  . Not on file     Family History:  The patient's family history includes Stroke in her father.   ROS:   Please see the history of present illness.    ROS All other systems reviewed and are negative.   PHYSICAL EXAM:   VS:  BP (!) 142/70 (BP Location: Right Arm, Patient Position: Sitting, Cuff Size: Normal)   Pulse 77   Ht 5' 5.5" (1.664 m)   Wt 172 lb 6.4 oz (78.2 kg)   SpO2 96%   BMI 28.25 kg/m    GEN: Well nourished, well developed, in no acute distress  HEENT: normal  Neck: no JVD, carotid bruits, or masses Cardiac: RRR; no murmurs, rubs, or gallops,no edema  Respiratory:  clear to auscultation bilaterally, normal work of breathing GI: soft, nontender, nondistended, + BS MS: no deformity or atrophy  Skin: warm and dry, no rash Neuro:  Alert  and Oriented x 3, Strength and sensation are intact Psych: euthymic mood, full affect  Wt Readings from Last 3 Encounters:  08/09/18 172 lb 6.4 oz (78.2 kg)  07/25/18 173 lb (78.5 kg)  06/26/18 173 lb 9.6 oz (78.7 kg)      Studies/Labs Reviewed:   EKG:  EKG is not ordered today.    Recent Labs: 05/22/2018: ALT  16 05/24/2018: BUN 32; Creatinine, Ser 1.77; Hemoglobin 12.4; Platelets 189; Potassium 3.9; Sodium 138   Lipid Panel    Component Value Date/Time   CHOL 363 (H) 05/23/2018 0856   TRIG 227 (H) 05/23/2018 0856   HDL 61 05/23/2018 0856   CHOLHDL 6.0 05/23/2018 0856   VLDL 45 (H) 05/23/2018 0856   LDLCALC 257 (H) 05/23/2018 0856    Additional studies/ records that were reviewed today include:   Echo 05/22/2018 LV EF: 60% -   65% Study Conclusions  - Left ventricle: The cavity size was normal. There was mild   concentric hypertrophy. Systolic function was normal. The   estimated ejection fraction was in the range of 60% to 65%. Wall   motion was normal; there were no regional wall motion   abnormalities. Doppler parameters are consistent with abnormal   left ventricular relaxation (grade 1 diastolic dysfunction).   Doppler parameters are consistent with indeterminate ventricular   filling pressure. - Aortic valve: Valve mobility was restricted. Transvalvular   velocity was within the normal range. There was no stenosis.   There was no regurgitation. - Mitral valve: Transvalvular velocity was within the normal range.   There was no evidence for stenosis. There was trivial   regurgitation. - Right ventricle: The cavity size was normal. Wall thickness was   normal. Systolic function was normal. - Tricuspid valve: There was mild regurgitation. - Pulmonary arteries: Systolic pressure was within the normal   range. PA peak pressure: 34 mm Hg (S). - Pericardium, extracardiac: A trivial pericardial effusion was   identified.    Carotid US 05/22/2018 IMPRESSION: 1. Large amount of left-sided atherosclerotic plaque results in elevated peak systolic velocities within the left internal carotid artery compatible with the 50-69% luminal narrowing range. Further evaluation with CTA could be performed as clinically indicated. 2. Moderate to large amount of right-sided atherosclerotic  plaque, not definitely resulting in a hemodynamically significant stenosis.    ASSESSMENT:    1. PAF (paroxysmal atrial fibrillation) ([place])   2. Coronary artery disease involving coronary bypass graft of native heart without angina pectoris   3. CKD (chronic kidney disease), stage III ([place])   4. Essential hypertension   5. Hyperlipidemia, unspecified hyperlipidemia type   6. Prediabetes   7. Hypothyroidism, unspecified type   8. Bilateral carotid artery stenosis   9. Pacemaker      PLAN:  In order of problems listed above:  1. PAF: CHA2DS2-Vasc score 7 (HTN, CAD, age, female, and CVA), recently started on Eliquis 2.5 mg twice daily.  Doing well on the current medication.  Plan to obtain CBC to follow-up on hemoglobin.  2. CAD s/p CABG: Denies any recent chest pain.  Aspirin and the Plavix was discontinued recently given the need for Eliquis  3. Hypertension: Blood pressure stable on current medication  4. Hyperlipidemia: On Repatha, last lipid panel obtained in July continue to show quite elevated triglyceride and LDL.  5. Prediabetes: Last hemoglobin A1c was 6.0.  6. Hypothyroidism: On Synthroid, managed by primary care provider  7. Bilateral carotid artery disease: Last carotid ultrasound 05/22/2018 showed  large amount left-sided plaque with elevated peak systolic velocity compatible with 50 to 69% stenosis, moderate to large amount of right sided plaque. Follow up with Dr. Donzetta Matters  8. SSS s/p PPM: Underwent pacemaker device change out earlier this year.  Remote download every 3 months, yearly office visits.    Medication Adjustments/Labs and Tests Ordered: Current medicines are reviewed at length with the patient today.  Concerns regarding medicines are outlined above.  Medication changes, Labs and Tests ordered today are listed in the Patient Instructions below. Patient Instructions  Medication Instructions:  Your physician recommends that you continue on your current  medications as directed. Please refer to the Current Medication list given to you today.  If you need a refill on your cardiac medications before your next appointment, please call your pharmacy.   Lab work: Your physician recommends that you return for lab work in: CMET, CBC, [place] If you have labs (blood work) drawn today and your tests are completely normal, you will receive your results only by: Marland Kitchen MyChart Message (if you have MyChart) OR . A paper copy in the mail If you have any lab test that is abnormal or we need to change your treatment, we will call you to review the results.  Testing/Procedures: NONE   Follow-Up: At [facility], you and your health needs are our priority.  As part of our continuing mission to provide you with exceptional heart care, we have created designated Provider Care Teams.  These Care Teams include your primary Cardiologist (physician) and Advanced Practice Providers (APPs -  Physician Assistants and Nurse Practitioners) who all work together to provide you with the care you need, when you need it. FOLLOW UP AS SCHEDULED  Any Other Special Instructions Will Be Listed Below (If Applicable). [place] SAMPLES AVAILABLE     Weston Brass Almyra Deforest, [place]  08/09/2018 11:12 AM    [address], [place], [place]  [address] Phone: [phone]; Fax: [phone]

## 2018-08-10 NOTE — Progress Notes (Signed)
Thanks, Hao MCr 

## 2018-08-13 ENCOUNTER — Other Ambulatory Visit: Payer: Self-pay

## 2018-08-13 DIAGNOSIS — E785 Hyperlipidemia, unspecified: Secondary | ICD-10-CM

## 2018-08-13 DIAGNOSIS — I5032 Chronic diastolic (congestive) heart failure: Secondary | ICD-10-CM

## 2018-08-13 DIAGNOSIS — I4892 Unspecified atrial flutter: Secondary | ICD-10-CM | POA: Diagnosis not present

## 2018-08-13 LAB — CBC WITH DIFFERENTIAL/PLATELET
BASOS ABS: 0 10*3/uL (ref 0.0–0.2)
Basos: 1 %
EOS (ABSOLUTE): 0.3 10*3/uL (ref 0.0–0.4)
Eos: 5 %
Hematocrit: 37 % (ref 34.0–46.6)
Hemoglobin: 12.5 g/dL (ref 11.1–15.9)
IMMATURE GRANS (ABS): 0 10*3/uL (ref 0.0–0.1)
Immature Granulocytes: 0 %
LYMPHS: 22 %
Lymphocytes Absolute: 1.4 10*3/uL (ref 0.7–3.1)
MCH: 29.8 pg (ref 26.6–33.0)
MCHC: 33.8 g/dL (ref 31.5–35.7)
MCV: 88 fL (ref 79–97)
MONOS ABS: 0.5 10*3/uL (ref 0.1–0.9)
Monocytes: 8 %
Neutrophils Absolute: 4.1 10*3/uL (ref 1.4–7.0)
Neutrophils: 64 %
PLATELETS: 269 10*3/uL (ref 150–450)
RBC: 4.2 x10E6/uL (ref 3.77–5.28)
RDW: 14.3 % (ref 12.3–15.4)
WBC: 6.4 10*3/uL (ref 3.4–10.8)

## 2018-08-13 LAB — LIPID PANEL
CHOL/HDL RATIO: 3.8 ratio (ref 0.0–4.4)
CHOLESTEROL TOTAL: 193 mg/dL (ref 100–199)
HDL: 51 mg/dL (ref >39)
LDL CALC: 111 mg/dL — AB (ref 0–99)
TRIGLYCERIDES: 153 mg/dL — AB (ref 0–149)
VLDL Cholesterol Cal: 31 mg/dL (ref 5–40)

## 2018-08-13 LAB — COMPREHENSIVE METABOLIC PANEL
A/G RATIO: 1.7 (ref 1.2–2.2)
ALK PHOS: 121 IU/L — AB (ref 39–117)
ALT: 21 IU/L (ref 0–32)
AST: 23 IU/L (ref 0–40)
Albumin: 4 g/dL (ref 3.2–4.6)
BILIRUBIN TOTAL: 0.4 mg/dL (ref 0.0–1.2)
BUN/Creatinine Ratio: 13 (ref 12–28)
BUN: 20 mg/dL (ref 10–36)
CHLORIDE: 99 mmol/L (ref 96–106)
CO2: 27 mmol/L (ref 20–29)
Calcium: 10 mg/dL (ref 8.7–10.3)
Creatinine, Ser: 1.54 mg/dL — ABNORMAL HIGH (ref 0.57–1.00)
GFR calc non Af Amer: 29 mL/min/{1.73_m2} — ABNORMAL LOW (ref >59)
GFR, EST AFRICAN AMERICAN: 33 mL/min/{1.73_m2} — AB (ref >59)
GLUCOSE: 99 mg/dL (ref 65–99)
Globulin, Total: 2.3 g/dL (ref 1.5–4.5)
POTASSIUM: 4.6 mmol/L (ref 3.5–5.2)
Sodium: 141 mmol/L (ref 134–144)
TOTAL PROTEIN: 6.3 g/dL (ref 6.0–8.5)

## 2018-08-14 NOTE — Progress Notes (Signed)
Red blood cell count normal. Kidney function improved slightly. Triglyceride improved compare to 2 month ago, LDL (bad cholesterol) significantly better than 2 month, LDL is cut almost by half although still higher than the recommended goal of < 70

## 2018-08-15 ENCOUNTER — Encounter: Payer: Self-pay | Admitting: Cardiology

## 2018-08-16 DIAGNOSIS — Z23 Encounter for immunization: Secondary | ICD-10-CM | POA: Diagnosis not present

## 2018-08-28 ENCOUNTER — Telehealth: Payer: Self-pay | Admitting: Cardiovascular Disease

## 2018-08-28 LAB — CUP PACEART REMOTE DEVICE CHECK
Brady Statistic AP VP Percent: 0 %
Brady Statistic AP VS Percent: 0 %
Brady Statistic AS VP Percent: 0 %
Brady Statistic AS VS Percent: 0 %
Brady Statistic RV Percent Paced: 35.51 %
Date Time Interrogation Session: 20191007203909
Implantable Lead Implant Date: 20100309
Implantable Lead Location: 753859
Implantable Lead Location: 753860
Implantable Lead Model: 5076
Lead Channel Impedance Value: 304 Ohm
Lead Channel Impedance Value: 399 Ohm
Lead Channel Impedance Value: 608 Ohm
Lead Channel Pacing Threshold Amplitude: 0.625 V
Lead Channel Pacing Threshold Pulse Width: 0.4 ms
Lead Channel Sensing Intrinsic Amplitude: 11.75 mV
Lead Channel Sensing Intrinsic Amplitude: 11.75 mV
Lead Channel Sensing Intrinsic Amplitude: 3.5 mV
Lead Channel Sensing Intrinsic Amplitude: 3.5 mV
Lead Channel Setting Pacing Amplitude: 2.5 V
Lead Channel Setting Pacing Pulse Width: 0.4 ms
MDC IDC LEAD IMPLANT DT: 20100309
MDC IDC MSMT BATTERY REMAINING LONGEVITY: 160 mo
MDC IDC MSMT BATTERY VOLTAGE: 3.14 V
MDC IDC MSMT LEADCHNL RA PACING THRESHOLD AMPLITUDE: 0.5 V
MDC IDC MSMT LEADCHNL RA PACING THRESHOLD PULSEWIDTH: 0.4 ms
MDC IDC MSMT LEADCHNL RV IMPEDANCE VALUE: 570 Ohm
MDC IDC PG IMPLANT DT: 20190327
MDC IDC SET LEADCHNL RA PACING AMPLITUDE: 2 V
MDC IDC SET LEADCHNL RV SENSING SENSITIVITY: 1.2 mV
MDC IDC STAT BRADY RA PERCENT PACED: 0 %

## 2018-08-28 NOTE — Telephone Encounter (Signed)
New Message:    Patient daughter calling about some results that need to go to another Dr. Gabriel Carina.

## 2018-08-28 NOTE — Telephone Encounter (Signed)
Called daughter, she would like labs to be sent to PCP office. I sent labs via epic to PCP office.

## 2018-08-31 ENCOUNTER — Ambulatory Visit: Payer: Medicare Other | Admitting: Vascular Surgery

## 2018-08-31 ENCOUNTER — Encounter (HOSPITAL_COMMUNITY): Payer: Medicare Other

## 2018-09-01 DIAGNOSIS — K649 Unspecified hemorrhoids: Secondary | ICD-10-CM | POA: Diagnosis not present

## 2018-09-01 DIAGNOSIS — I4891 Unspecified atrial fibrillation: Secondary | ICD-10-CM | POA: Diagnosis not present

## 2018-09-01 DIAGNOSIS — K5900 Constipation, unspecified: Secondary | ICD-10-CM | POA: Diagnosis not present

## 2018-09-03 ENCOUNTER — Telehealth: Payer: Self-pay | Admitting: Cardiovascular Disease

## 2018-09-03 NOTE — Telephone Encounter (Signed)
New Message   Patients daughter is calling reference to lab results. She states that the PCP office is asking for the most recent labs. It shows that it was sent via epic but they say they have not received them. She is asking that it can be faxed to [phone]. Thank you.

## 2018-09-03 NOTE — Telephone Encounter (Signed)
Labs refaxed to # provided, Dr. Nevada Crane (PCP)

## 2018-09-04 DIAGNOSIS — N184 Chronic kidney disease, stage 4 (severe): Secondary | ICD-10-CM | POA: Diagnosis not present

## 2018-09-04 DIAGNOSIS — I5032 Chronic diastolic (congestive) heart failure: Secondary | ICD-10-CM | POA: Diagnosis not present

## 2018-09-04 DIAGNOSIS — J449 Chronic obstructive pulmonary disease, unspecified: Secondary | ICD-10-CM | POA: Diagnosis not present

## 2018-09-04 DIAGNOSIS — H5410 Blindness, one eye, low vision other eye, unspecified eyes: Secondary | ICD-10-CM | POA: Diagnosis not present

## 2018-09-04 DIAGNOSIS — I10 Essential (primary) hypertension: Secondary | ICD-10-CM | POA: Diagnosis not present

## 2018-09-04 DIAGNOSIS — I6529 Occlusion and stenosis of unspecified carotid artery: Secondary | ICD-10-CM | POA: Diagnosis not present

## 2018-09-04 DIAGNOSIS — R7301 Impaired fasting glucose: Secondary | ICD-10-CM | POA: Diagnosis not present

## 2018-09-04 DIAGNOSIS — I480 Paroxysmal atrial fibrillation: Secondary | ICD-10-CM | POA: Diagnosis not present

## 2018-09-04 DIAGNOSIS — E790 Hyperuricemia without signs of inflammatory arthritis and tophaceous disease: Secondary | ICD-10-CM | POA: Diagnosis not present

## 2018-09-04 DIAGNOSIS — I2510 Atherosclerotic heart disease of native coronary artery without angina pectoris: Secondary | ICD-10-CM | POA: Diagnosis not present

## 2018-09-04 DIAGNOSIS — E782 Mixed hyperlipidemia: Secondary | ICD-10-CM | POA: Diagnosis not present

## 2018-09-10 DIAGNOSIS — H35033 Hypertensive retinopathy, bilateral: Secondary | ICD-10-CM | POA: Diagnosis not present

## 2018-09-10 DIAGNOSIS — H43813 Vitreous degeneration, bilateral: Secondary | ICD-10-CM | POA: Diagnosis not present

## 2018-09-10 DIAGNOSIS — H35371 Puckering of macula, right eye: Secondary | ICD-10-CM | POA: Diagnosis not present

## 2018-09-10 DIAGNOSIS — H34232 Retinal artery branch occlusion, left eye: Secondary | ICD-10-CM | POA: Diagnosis not present

## 2018-09-14 ENCOUNTER — Ambulatory Visit (INDEPENDENT_AMBULATORY_CARE_PROVIDER_SITE_OTHER): Payer: Medicare Other | Admitting: Cardiovascular Disease

## 2018-09-14 ENCOUNTER — Encounter: Payer: Self-pay | Admitting: Cardiovascular Disease

## 2018-09-14 VITALS — BP 138/76 | HR 80 | Ht 65.5 in | Wt 171.4 lb

## 2018-09-14 DIAGNOSIS — I5032 Chronic diastolic (congestive) heart failure: Secondary | ICD-10-CM

## 2018-09-14 DIAGNOSIS — I10 Essential (primary) hypertension: Secondary | ICD-10-CM

## 2018-09-14 DIAGNOSIS — I2510 Atherosclerotic heart disease of native coronary artery without angina pectoris: Secondary | ICD-10-CM | POA: Diagnosis not present

## 2018-09-14 DIAGNOSIS — I6522 Occlusion and stenosis of left carotid artery: Secondary | ICD-10-CM

## 2018-09-14 DIAGNOSIS — Z950 Presence of cardiac pacemaker: Secondary | ICD-10-CM

## 2018-09-14 DIAGNOSIS — I480 Paroxysmal atrial fibrillation: Secondary | ICD-10-CM | POA: Diagnosis not present

## 2018-09-14 DIAGNOSIS — Z7901 Long term (current) use of anticoagulants: Secondary | ICD-10-CM

## 2018-09-14 DIAGNOSIS — E7800 Pure hypercholesterolemia, unspecified: Secondary | ICD-10-CM

## 2018-09-14 DIAGNOSIS — I4819 Other persistent atrial fibrillation: Secondary | ICD-10-CM

## 2018-09-14 DIAGNOSIS — I6523 Occlusion and stenosis of bilateral carotid arteries: Secondary | ICD-10-CM

## 2018-09-14 DIAGNOSIS — I495 Sick sinus syndrome: Secondary | ICD-10-CM

## 2018-09-14 LAB — CBC
Hematocrit: 38.8 % (ref 34.0–46.6)
Hemoglobin: 13 g/dL (ref 11.1–15.9)
MCH: 29.4 pg (ref 26.6–33.0)
MCHC: 33.5 g/dL (ref 31.5–35.7)
MCV: 88 fL (ref 79–97)
Platelets: 253 10*3/uL (ref 150–450)
RBC: 4.42 x10E6/uL (ref 3.77–5.28)
RDW: 13.7 % (ref 12.3–15.4)
WBC: 7.2 10*3/uL (ref 3.4–10.8)

## 2018-09-14 LAB — BASIC METABOLIC PANEL
BUN/Creatinine Ratio: 11 — ABNORMAL LOW (ref 12–28)
BUN: 18 mg/dL (ref 10–36)
CALCIUM: 10.3 mg/dL (ref 8.7–10.3)
CO2: 25 mmol/L (ref 20–29)
CREATININE: 1.71 mg/dL — AB (ref 0.57–1.00)
Chloride: 96 mmol/L (ref 96–106)
GFR, EST AFRICAN AMERICAN: 29 mL/min/{1.73_m2} — AB (ref >59)
GFR, EST NON AFRICAN AMERICAN: 25 mL/min/{1.73_m2} — AB (ref >59)
Glucose: 93 mg/dL (ref 65–99)
Potassium: 4.6 mmol/L (ref 3.5–5.2)
SODIUM: 137 mmol/L (ref 134–144)

## 2018-09-14 NOTE — H&P (View-Only) (Signed)
Cardiology Office Note    Date:  09/14/2018   ID:  Amy [name], DOB 19-Sep-1922, MRN [id number]  PCP:  Celene Squibb, MD  Cardiologist:   Sanda Klein, MD   Chief complaint: Atrial fibrillation; (pacemaker check, coronary artery disease)   History of Present Illness:  Amy [name] is a 84 y.o. female with history of coronary artery disease and previous bypass surgery as well as sinus node dysfunction and persistent atrial arrhythmia, history of left inferior hemianopsia due to retinal artery occlusion ; she is accompanied by her daughter.  At her last office appointment with me August 27 she was found to be in asymptomatic atrial fibrillation.  It had just started at that time.  Interrogation of her pacemaker today shows that the arrhythmia has been persistent for the last couple of months.  She has tolerated anticoagulation with Eliquis without serious bleeding problems.  On a couple of occasions she had mild bleeding.  She has not had any falls or injuries.  She has not had any new neurological events.  In July,  she was hospitalized with sudden left-sided monocular vision loss.  Her pacemaker was not interrogated.  Her carotid Doppler study showed a moderate stenosis in the ipsilateral internal carotid.  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  The patient specifically denies any chest pain at rest or exertion, dyspnea at rest or with exertion, orthopnea, paroxysmal nocturnal dyspnea, syncope, palpitations, focal neurological deficits, intermittent claudication, lower extremity edema, unexplained weight gain, cough, hemoptysis or wheezing.  Pacemaker interrogation also suggest some reduction in activity level since her last appointment, down from about 2 hours a day to about 1 hour a day.  Otherwise pacemaker function is normal.  Estimated generator longevity is over 13 years.  The parameters  remain excellent.  She has 34% ventricular pacing, which is a marked increase compared with the absence of ventricular pacing before she had atrial fibrillation.  Prior to atrial fibrillation she had about 67% atrial pacing.   In 2009 she had an acute inferior wall ST segment elevation myocardial infarction treated with urgent angioplasty and placement of a bare-metal stent. Subsequently, she underwent multivessel bypass surgery with LIMA to LAD, SVG to ramus SVG to RCA. She has no evidence of residual myocardial injury and has preserved left ventricular systolic function. In June 2015, cardiac catheterization showed 90% proximal and mid LAD with patent distal LIMA bypass; 40% ostial circumflex, not bypassed; unchanged 80% proximal ramus intermedius, SVG to ramus occluded; patent stent right coronary artery, SVG to right coronary artery occluded. Her LVEF was 55%, there was no significant mitral regurgitation and the LVEDP was high normal at 15 mm Hg, (when she was short of breath). [name] stated that her dyspnea is likely multifactorial and cannot be explained completely by cardiac findings. In May 2016 she had a low risk nuclear study.  In 2010 a permanent pacemaker (Medtronic) was implanted for symptomatic bradycardia.  She underwent a generator change out in March 2019.  In late 2013 she had a fall complicated by a hip fracture and radius fracture. She had an esophageal perforation complicated by mediastinitis and had a feeding tube for several months. In October 2015 she had another fall in her home (no syncope, tripped at [facility].carpet transition) and fractured her pelvis and humerus.   She has fairly severe hyperlipidemia with an LDL cholesterol in excess of 200. She  is statin, niacin, ezetimibe and resin intolerant. She has discussed PSCK9 inhibitors in the past but has decided not to take these.  She has systolic HTN, but also a tendency to marked orthostatic hypotension (25 mm Hg drop in  SBP).  She has moderate CKD, baseline GFR around 30 mL/minute, has seen Dr. Lowanda Foster in the past, but does not have regular nephrology follow-up.  July 2019 she had an episode of unilateral inferior hemianopsia.  Brief episodes of atrial fibrillation were seen around that time.  There was also a moderate left internal carotid artery stenosis.  She had persistent atrial fibrillation from late July until November 2019.  Past Medical History:  Diagnosis Date  . Anxiety   . Arthritis   . Asthma   . CAD in native artery    s/p CABG x 3; Last Myoview in 07/2009 - non-ischemic; Echo 03/2012  Aortic Sclerosis with normal EF.  . CKD (chronic kidney disease), stage III ([place])   . DVT (deep vein thrombosis) in pregnancy   . Esophageal perforation    9/13  . Fall 07/21/2014   FALL WITH INJURY  . Fracture of head of humerus   . Fracture of hip ([place]) 07/21/2014   LEFT  . GERD (gastroesophageal reflux disease)   . Hypercholesteremia   . Hypertension   . Hypothyroidism   . Loss of vision 05/22/2018   LEFT EYE  . Mediastinitis    s/p drainage  . Myocardial infarction [facility])    Inferior STEMI 07/2008 - PCI of prox RCA followed byu CABG x 3 in 9/'09  . Pacemaker   . PAF (paroxysmal atrial fibrillation) ([place])   . Paroxysmal atrial flutter ([place]) 11/26/2014  . Pneumonia   . Shortness of breath   . SSS (sick sinus syndrome) ([place]) 11/26/2014  . Status post hip hemiarthroplasty left hip by Dr Ronnie Derby 08/08/2012    Past Surgical History:  Procedure Laterality Date  . CARPAL TUNNEL RELEASE  08/08/2012   Procedure: CARPAL TUNNEL RELEASE;  Surgeon: Schuyler Amor, MD;  Location: [place];  Service: Orthopedics;  Laterality: Left;  . CORONARY ARTERY BYPASS GRAFT  2009   LIMA-LAD, SVG-RI, SVG-stented RCA  . ESOPHAGOGASTRODUODENOSCOPY  08/10/2012   Procedure: ESOPHAGOGASTRODUODENOSCOPY (EGD);  Surgeon: Beryle Beams, MD;  Location: [facility] ENDOSCOPY;  Service: Endoscopy;  Laterality: N/A;  . ESOPHAGOSCOPY   08/10/2012   Procedure: ESOPHAGOSCOPY;  Surgeon: [name], MD;  Location: [place];  Service: ENT;  Laterality: N/A;  . GASTROSTOMY  08/16/2012   Procedure: GASTROSTOMY;  Surgeon: Zenovia Jarred, MD;  Location: [place];  Service: General;  Laterality: N/A;  . HIP ARTHROPLASTY  08/08/2012   Procedure: ARTHROPLASTY BIPOLAR HIP;  Surgeon: Rudean Haskell, MD;  Location: [place];  Service: Orthopedics;  Laterality: Left;  [place]   . JOINT REPLACEMENT    . LEFT HEART CATHETERIZATION WITH CORONARY ANGIOGRAM N/A 04/16/2014   Procedure: LEFT HEART CATHETERIZATION WITH CORONARY ANGIOGRAM;  Surgeon: Wellington [place], MD;  Location: [place] CATH LAB;  Service: Cardiovascular;  Laterality: N/A;  . NM MYOCAR PERF WALL MOTION  08/14/2009   Normal  . PACEMAKER INSERTION  01/06/2009   Medtronic  . PPM GENERATOR CHANGEOUT N/A 01/24/2018   Procedure: PPM GENERATOR CHANGEOUT;  Surgeon: Sanda Klein, MD;  Location: [place] CV LAB;  Service: Cardiovascular;  Laterality: N/A;  . TOTAL HIP ARTHROPLASTY    . WRIST FRACTURE SURGERY  07/2012   left intra articular  with carpel tunnel    Current  Medications: Outpatient Medications Prior to Visit  Medication Sig Dispense Refill  . acetaminophen (TYLENOL) 500 MG tablet Take 1,000 mg by mouth every 6 (six) hours as needed for moderate pain.    Marland Kitchen albuterol (PROVENTIL HFA;VENTOLIN HFA) 108 (90 Base) MCG/ACT inhaler Inhale 2 puffs into the lungs every 6 (six) hours as needed for wheezing or shortness of breath.     . allopurinol (ZYLOPRIM) 100 MG tablet Take 100 mg by mouth daily.  2  . apixaban (ELIQUIS) 2.5 MG TABS tablet Take 1 tablet (2.5 mg total) by mouth 2 (two) times daily. 60 tablet 5  . Cholecalciferol (VITAMIN D3 PO) Take 1 tablet by mouth daily.    . cycloSPORINE (RESTASIS) 0.05 % ophthalmic emulsion Place 1 drop into both eyes 2 (two) times daily.    Marland Kitchen docusate sodium (COLACE) 100 MG capsule Take 100 mg by mouth 2 (two) times daily.    .  Fluticasone-Umeclidin-Vilant (TRELEGY ELLIPTA) 100-62.5-25 MCG/INH AEPB Inhale 1 puff into the lungs daily.     . furosemide (LASIX) 40 MG tablet Take 20 mg by mouth daily.     . Lactobacillus-Inulin (CULTURELLE DIGESTIVE HEALTH PO) Take 1 capsule by mouth daily.     Marland Kitchen levothyroxine (SYNTHROID, LEVOTHROID) 50 MCG tablet Take 50 mcg by mouth daily before breakfast.     . LORazepam (ATIVAN) 1 MG tablet Take 1 mg by mouth at bedtime as needed for anxiety or sleep.     . metoprolol succinate (TOPROL XL) 25 MG 24 hr tablet Take 1 tablet (25 mg total) by mouth daily. 90 tablet 3  . Misc Natural Products (TART CHERRY ADVANCED PO) Take 1 tablet by mouth daily.    . nitroGLYCERIN (NITROSTAT) 0.4 MG SL tablet Place 1 tablet (0.4 mg total) under the tongue every 5 (five) minutes x 3 doses as needed for chest pain. 25 tablet 12  . omeprazole (PRILOSEC) 40 MG capsule TAKE 1 CAPSULE BY MOUTH ONCE DAILY 90 capsule 3  . ondansetron (ZOFRAN) 4 MG tablet Take 1 tablet by mouth daily as needed.    . potassium chloride (K-DUR) 10 MEQ tablet Take 1 tablet (10 mEq total) by mouth daily. 90 tablet 1  . REPATHA SURECLICK 094 MG/ML SOAJ INJECT ONE INJECTION EVERY 2 WEEKS  5  . Wheat Dextrin (BENEFIBER DRINK MIX PO) Take 1 Dose by mouth 2 (two) times daily as needed (constipation).    . ranitidine (ZANTAC) 150 MG tablet TAKE ONE TABLET BY MOUTH ONCE DAILY AT BEDTIME (Patient not taking: Reported on 09/14/2018) 90 tablet 3   No facility-administered medications prior to visit.      Allergies:   Colestipol; Hydrocodone-acetaminophen; Niacin and related; Statins; and Zetia [ezetimibe]   Social History   Socioeconomic History  . Marital status: Widowed    Spouse name: Not on file  . Number of children: Not on file  . Years of education: Not on file  . Highest education level: Not on file  Occupational History  . Not on file  Social Needs  . Financial resource strain: Not on file  . Food insecurity:    Worry: Not  on file    Inability: Not on file  . Transportation needs:    Medical: Not on file    Non-medical: Not on file  Tobacco Use  . Smoking status: Never Smoker  . Smokeless tobacco: Never Used  Substance and Sexual Activity  . Alcohol use: No  . Drug use: No  . Sexual activity: Not Currently  Lifestyle  . Physical activity:    Days per week: Not on file    Minutes per session: Not on file  . Stress: Not on file  Relationships  . Social connections:    Talks on phone: Not on file    Gets together: Not on file    Attends religious service: Not on file    Active member of club or organization: Not on file    Attends meetings of clubs or organizations: Not on file    Relationship status: Not on file  Other Topics Concern  . Not on file  Social History Narrative  . Not on file     Family History:  The patient's family history includes Stroke in her father.   ROS:   Please see the history of present illness.    ROS all other systems are reviewed and are negative   PHYSICAL EXAM:   VS:  BP 138/76   Pulse 80   Ht 5' 5.5" (1.664 m)   Wt 171 lb 6.4 oz (77.7 kg)   BMI 28.09 kg/m      General: Alert, oriented x3, no distress, appears comfortable.  Healthy left subclavian pacemaker site Head: no evidence of trauma, PERRL, EOMI, no exophtalmos or lid lag, no myxedema, no xanthelasma; normal ears, nose and oropharynx Neck: normal jugular venous pulsations and no hepatojugular reflux; brisk carotid pulses without delay and no carotid bruits Chest: clear to auscultation, no signs of consolidation by percussion or palpation, normal fremitus, symmetrical and full respiratory excursions Cardiovascular: normal position and quality of the apical impulse, regular rhythm, normal first and paradoxically split second heart sounds, no murmurs, rubs or gallops Abdomen: no tenderness or distention, no masses by palpation, no abnormal pulsatility or arterial bruits, normal bowel sounds, no  hepatosplenomegaly Extremities: no clubbing, cyanosis or edema; 2+ radial, ulnar and brachial pulses bilaterally; 2+ right femoral, posterior tibial and dorsalis pedis pulses; 2+ left femoral, posterior tibial and dorsalis pedis pulses; no subclavian or femoral bruits Neurological: grossly nonfocal (known left visual field cut was not tested today) Psych: Normal mood and affect    Wt Readings from Last 3 Encounters:  09/14/18 171 lb 6.4 oz (77.7 kg)  08/09/18 172 lb 6.4 oz (78.2 kg)  07/25/18 173 lb (78.5 kg)    Studies/Labs Reviewed:   EKG:  EKG is not ordered today.  The intracardiac electrogram shows atrial fibrillation with mostly ventricular paced rhythm, occasional ventricular sensing.  There have been no episodes of high ventricular rate.   Lipid Panel    Component Value Date/Time   CHOL 193 08/13/2018 0955   TRIG 153 (H) 08/13/2018 0955   HDL 51 08/13/2018 0955   CHOLHDL 3.8 08/13/2018 0955   CHOLHDL 6.0 05/23/2018 0856   VLDL 45 (H) 05/23/2018 0856   LDLCALC 111 (H) 08/13/2018 0955   BMET    Component Value Date/Time   NA 141 08/13/2018 0955   K 4.6 08/13/2018 0955   CL 99 08/13/2018 0955   CO2 27 08/13/2018 0955   GLUCOSE 99 08/13/2018 0955   GLUCOSE 105 (H) 05/24/2018 0329   BUN 20 08/13/2018 0955   CREATININE 1.54 (H) 08/13/2018 0955   CREATININE 1.19 (H) 03/05/2015 1157   CALCIUM 10.0 08/13/2018 0955   GFRNONAA 29 (L) 08/13/2018 0955   GFRAA 33 (L) 08/13/2018 0955    ASSESSMENT:    1. Other persistent atrial fibrillation   2. Long term current use of anticoagulant   3. Chronic diastolic heart failure (  [place])   4. Coronary artery disease involving native coronary artery of native heart without angina pectoris   5. Hypercholesterolemia with LDL greater than 190 mg/dL   6. SSS (sick sinus syndrome) ([place])   7. Essential hypertension   8. Pacemaker   9. Left carotid stenosis      PLAN:  In order of problems listed above:  1. AFlutter/fib: Even though  the arrhythmia appears to be asymptomatic, her pacemaker shows a decreased activity level since she has been in persistent atrial fibrillation.  This might be because she is very wary of falling and injuries now that she is on anticoagulation, or may be the arrhythmia really does have impact on functional status.  We discussed ways to manage the arrhythmia.  I recommend a one-time trial of restoration of normal rhythm with cardioversion, to see if this will be associated with any meaningful functional status improvement.  She has been compliant with anticoagulation and I stressed the importance of maintaining compliance for at least 30 days after the cardioversion. This procedure has been fully reviewed with the patient and written informed consent has been obtained.  There is irregular with return of arrhythmia, I am not sure I would recommend aggressive antiarrhythmic therapy since the arrhythmia is not severely symptomatic. 2. Eliquis: So far well-tolerated CHADSVasc 8 (age 3, CVA 2, gender, HF, PAD/CAD, HTN).  Although I previously considered her too elderly and frail to start oral anticoagulation, the recent retinal artery embolism letter to the recommendation for oral anticoagulants.  Dose adjusted for age and reduced renal function.  She had serious complications after falls in 2013 and 2015, but no falls since. 3. CHF: Clinically euvolemic and asymptomatic, relatively sedentary.  Increase diuretics if she reaches 180 pounds when she appears to become symptomatic 4. CAD s/p CABG:  asymptomatic, angina free. 5. HLP: Started on Repatha about 3 months ago.  Recheck lipid profile shows a dramatic improvement in LDL cholesterol, a 57% reduction, although not at target less than 70 due to very high baseline LDL. 6. SSS: Roughly 65% atrial pacing with appropriate heart rate histograms for her level of activity, prior to the onset of atrial fibrillation. 7. HTN: Blood pressure is in ideal range.  I think we  should tolerate blood pressure recordings in the 140s and 150s to avoid falls from orthostatic hypotension. 8. PPM: Normal device function.  Remote downloads every 3 months and yearly office visits. 9. Moderate stenosis L ICA: Has a follow-up visit with Dr. Donzetta Matters but she is not a good candidate for carotid endarterectomy    Medication Adjustments/Labs and Tests Ordered: Current medicines are reviewed at length with the patient today.  Concerns regarding medicines are outlined above.  Medication changes, Labs and Tests ordered today are listed in the Patient Instructions below. Patient Instructions  Medication Instructions:  Dr Sallyanne Kuster recommends that you continue on your current medications as directed. Please refer to the Current Medication list given to you today.  If you need a refill on your cardiac medications before your next appointment, please call your pharmacy.   Lab work: Your physician recommends that you return for lab work TODAY.   If you have labs (blood work) drawn today and your tests are completely normal, you will receive your results only by: Marland Kitchen MyChart Message (if you have MyChart) OR . A paper copy in the mail If you have any lab test that is abnormal or we need to change your treatment, we will call you to review  the results.  Testing/Procedures: 1. Cardioversion - Your physician has recommended that you have a Cardioversion (DCCV). Electrical Cardioversion uses a jolt of electricity to your heart either through paddles or wired patches attached to your chest. This is a controlled, usually prescheduled, procedure. Defibrillation is done under light anesthesia in the hospital, and you usually go home the day of the procedure. This is done to get your heart back into a normal rhythm. You are not awake for the procedure. Please see the instruction sheet given to you today.  2. Remote monitoring is used to monitor your Pacemaker of ICD from home. This monitoring reduces the  number of office visits required to check your device to one time per year. It allows [place] to keep an eye on the functioning of your device to ensure it is working properly. You are scheduled for a device check from home on Friday, November 29th, 2019. You may send your transmission at any time that day. If you have a wireless device, the transmission will be sent automatically. After your physician reviews your transmission, you will receive a postcard with your next transmission date.  Follow-Up: At [facility], you and your health needs are our priority.  As part of our continuing mission to provide you with exceptional heart care, we have created designated Provider Care Teams.  These Care Teams include your primary Cardiologist (physician) and Advanced Practice Providers (APPs -  Physician Assistants and Nurse Practitioners) who all work together to provide you with the care you need, when you need it. You will need a follow up appointment in 6 months.  Please call our office 2 months in advance to schedule this appointment.  You may see Sanda Klein, MD or one of the following Advanced Practice Providers on your designated Care Team: [place], [place] . Fabian Sharp, PA-C  Any Other Special Instructions Will Be Listed Below (If Applicable).   You are scheduled for a Cardioversion on Friday, November 22nd, 2019 with Dr. Sallyanne Kuster.  Please arrive at the [facility] (Main Entrance A) at [facility]: [address] [place], [place] [address] at 9:30 am.  DIET: Nothing to eat or drink after midnight except a sip of water with medications  Medication Instructions: Continue your anticoagulant Eliquis You will need to continue your anticoagulant after your procedure until you are told by your provider that it is safe to stop  Labs: You will have lab work completed today.  You must have a responsible person to drive you home and stay in the waiting area during your procedure. Failure to do so  could result in cancellation.  Bring your insurance cards.  *Special Note: Every effort is made to have your procedure done on time. Occasionally there are emergencies that occur at the hospital that may cause delays. Please be patient if a delay does occur.      Signed, Sanda Klein, MD  09/14/2018 1:30 PM    [facility] [place], [place], [place]  [address] Phone: [phone]; Fax: [phone]

## 2018-09-14 NOTE — Patient Instructions (Addendum)
Medication Instructions:  Dr Sallyanne Kuster recommends that you continue on your current medications as directed. Please refer to the Current Medication list given to you today.  If you need a refill on your cardiac medications before your next appointment, please call your pharmacy.   Lab work: Your physician recommends that you return for lab work TODAY.   If you have labs (blood work) drawn today and your tests are completely normal, you will receive your results only by: Marland Kitchen MyChart Message (if you have MyChart) OR . A paper copy in the mail If you have any lab test that is abnormal or we need to change your treatment, we will call you to review the results.  Testing/Procedures: 1. Cardioversion - Your physician has recommended that you have a Cardioversion (DCCV). Electrical Cardioversion uses a jolt of electricity to your heart either through paddles or wired patches attached to your chest. This is a controlled, usually prescheduled, procedure. Defibrillation is done under light anesthesia in the hospital, and you usually go home the day of the procedure. This is done to get your heart back into a normal rhythm. You are not awake for the procedure. Please see the instruction sheet given to you today.  2. Remote monitoring is used to monitor your Pacemaker of ICD from home. This monitoring reduces the number of office visits required to check your device to one time per year. It allows [place] to keep an eye on the functioning of your device to ensure it is working properly. You are scheduled for a device check from home on Friday, November 29th, 2019. You may send your transmission at any time that day. If you have a wireless device, the transmission will be sent automatically. After your physician reviews your transmission, you will receive a postcard with your next transmission date.  Follow-Up: At [facility], you and your health needs are our priority.  As part of our continuing mission to provide  you with exceptional heart care, we have created designated Provider Care Teams.  These Care Teams include your primary Cardiologist (physician) and Advanced Practice Providers (APPs -  Physician Assistants and Nurse Practitioners) who all work together to provide you with the care you need, when you need it. You will need a follow up appointment in 6 months.  Please call our office 2 months in advance to schedule this appointment.  You may see Sanda Klein, MD or one of the following Advanced Practice Providers on your designated Care Team: [place], [place] . Fabian Sharp, PA-C  Any Other Special Instructions Will Be Listed Below (If Applicable).   You are scheduled for a Cardioversion on Friday, November 22nd, 2019 with Dr. Sallyanne Kuster.  Please arrive at the [facility] (Main Entrance A) at [facility]: [address] [place], [place] [address] at 9:30 am.  DIET: Nothing to eat or drink after midnight except a sip of water with medications  Medication Instructions: Continue your anticoagulant Eliquis You will need to continue your anticoagulant after your procedure until you are told by your provider that it is safe to stop  Labs: You will have lab work completed today.  You must have a responsible person to drive you home and stay in the waiting area during your procedure. Failure to do so could result in cancellation.  Bring your insurance cards.  *Special Note: Every effort is made to have your procedure done on time. Occasionally there are emergencies that occur at the hospital that may cause delays.  Please be patient if a delay does occur.

## 2018-09-14 NOTE — Progress Notes (Signed)
Cardiology Office Note    Date:  09/14/2018   ID:  Amy [name], DOB Apr 17, 1922, MRN [id number]  PCP:  Amy Squibb, MD  Cardiologist:   Amy Klein, MD   Chief complaint: Atrial fibrillation; (pacemaker check, coronary artery disease)   History of Present Illness:  Amy [name] is a 84 y.o. female with history of coronary artery disease and previous bypass surgery as well as sinus node dysfunction and persistent atrial arrhythmia, history of left inferior hemianopsia due to retinal artery occlusion ; she is accompanied by her daughter.  At her last office appointment with me August 27 she was found to be in asymptomatic atrial fibrillation.  It had just started at that time.  Interrogation of her pacemaker today shows that the arrhythmia has been persistent for the last couple of months.  She has tolerated anticoagulation with Eliquis without serious bleeding problems.  On a couple of occasions she had mild bleeding.  She has not had any falls or injuries.  She has not had any new neurological events.  In July,  she was hospitalized with sudden left-sided monocular vision loss.  Her pacemaker was not interrogated.  Her carotid Doppler study showed a moderate stenosis in the ipsilateral internal carotid.  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  The patient specifically denies any chest pain at rest or exertion, dyspnea at rest or with exertion, orthopnea, paroxysmal nocturnal dyspnea, syncope, palpitations, focal neurological deficits, intermittent claudication, lower extremity edema, unexplained weight gain, cough, hemoptysis or wheezing.  Pacemaker interrogation also suggest some reduction in activity level since her last appointment, down from about 2 hours a day to about 1 hour a day.  Otherwise pacemaker function is normal.  Estimated generator longevity is over 13 years.  The parameters  remain excellent.  She has 34% ventricular pacing, which is a marked increase compared with the absence of ventricular pacing before she had atrial fibrillation.  Prior to atrial fibrillation she had about 67% atrial pacing.   In 2009 she had an acute inferior wall ST segment elevation myocardial infarction treated with urgent angioplasty and placement of a bare-metal stent. Subsequently, she underwent multivessel bypass surgery with LIMA to LAD, SVG to ramus SVG to RCA. She has no evidence of residual myocardial injury and has preserved left ventricular systolic function. In June 2015, cardiac catheterization showed 90% proximal and mid LAD with patent distal LIMA bypass; 40% ostial circumflex, not bypassed; unchanged 80% proximal ramus intermedius, SVG to ramus occluded; patent stent right coronary artery, SVG to right coronary artery occluded. Her LVEF was 55%, there was no significant mitral regurgitation and the LVEDP was high normal at 15 mm Hg, (when she was short of breath). Amy [name] stated that her dyspnea is likely multifactorial and cannot be explained completely by cardiac findings. In May 2016 she had a low risk nuclear study.  In 2010 a permanent pacemaker (Medtronic) was implanted for symptomatic bradycardia.  She underwent a generator change out in March 2019.  In late 2013 she had a fall complicated by a hip fracture and radius fracture. She had an esophageal perforation complicated by mediastinitis and had a feeding tube for several months. In October 2015 she had another fall in her home (no syncope, tripped at [facility].carpet transition) and fractured her pelvis and humerus.   She has fairly severe hyperlipidemia with an LDL cholesterol in excess of 200. She  is statin, niacin, ezetimibe and resin intolerant. She has discussed PSCK9 inhibitors in the past but has decided not to take these.  She has systolic HTN, but also a tendency to marked orthostatic hypotension (25 mm Hg drop in  SBP).  She has moderate CKD, baseline GFR around 30 mL/minute, has seen Amy [name] in the past, but does not have regular nephrology follow-up.  July 2019 she had an episode of unilateral inferior hemianopsia.  Brief episodes of atrial fibrillation were seen around that time.  There was also a moderate left internal carotid artery stenosis.  She had persistent atrial fibrillation from late July until November 2019.  Past Medical History:  Diagnosis Date  . Anxiety   . Arthritis   . Asthma   . CAD in native artery    s/p CABG x 3; Last Myoview in 07/2009 - non-ischemic; Echo 03/2012  Aortic Sclerosis with normal EF.  . CKD (chronic kidney disease), stage III ([place])   . DVT (deep vein thrombosis) in pregnancy   . Esophageal perforation    9/13  . Fall 07/21/2014   FALL WITH INJURY  . Fracture of head of humerus   . Fracture of hip ([place]) 07/21/2014   LEFT  . GERD (gastroesophageal reflux disease)   . Hypercholesteremia   . Hypertension   . Hypothyroidism   . Loss of vision 05/22/2018   LEFT EYE  . Mediastinitis    s/p drainage  . Myocardial infarction [facility])    Inferior STEMI 07/2008 - PCI of prox RCA followed byu CABG x 3 in 9/'09  . Pacemaker   . PAF (paroxysmal atrial fibrillation) ([place])   . Paroxysmal atrial flutter ([place]) 11/26/2014  . Pneumonia   . Shortness of breath   . SSS (sick sinus syndrome) ([place]) 11/26/2014  . Status post hip hemiarthroplasty left hip by Dr Amy [name] 08/08/2012    Past Surgical History:  Procedure Laterality Date  . CARPAL TUNNEL RELEASE  08/08/2012   Procedure: CARPAL TUNNEL RELEASE;  Surgeon: Amy Amor, MD;  Location: [place];  Service: Orthopedics;  Laterality: Left;  . CORONARY ARTERY BYPASS GRAFT  2009   LIMA-LAD, SVG-RI, SVG-stented RCA  . ESOPHAGOGASTRODUODENOSCOPY  08/10/2012   Procedure: ESOPHAGOGASTRODUODENOSCOPY (EGD);  Surgeon: Amy Beams, MD;  Location: [facility] ENDOSCOPY;  Service: Endoscopy;  Laterality: N/A;  . ESOPHAGOSCOPY   08/10/2012   Procedure: ESOPHAGOSCOPY;  Surgeon: Amy Marble, MD;  Location: [place];  Service: ENT;  Laterality: N/A;  . GASTROSTOMY  08/16/2012   Procedure: GASTROSTOMY;  Surgeon: Zenovia Jarred, MD;  Location: [place];  Service: General;  Laterality: N/A;  . HIP ARTHROPLASTY  08/08/2012   Procedure: ARTHROPLASTY BIPOLAR HIP;  Surgeon: Rudean Haskell, MD;  Location: [place];  Service: Orthopedics;  Laterality: Left;  [place]   . JOINT REPLACEMENT    . LEFT HEART CATHETERIZATION WITH CORONARY ANGIOGRAM N/A 04/16/2014   Procedure: LEFT HEART CATHETERIZATION WITH CORONARY ANGIOGRAM;  Surgeon: Wellington Hampshire, MD;  Location: [place] CATH LAB;  Service: Cardiovascular;  Laterality: N/A;  . NM MYOCAR PERF WALL MOTION  08/14/2009   Normal  . PACEMAKER INSERTION  01/06/2009   Medtronic  . PPM GENERATOR CHANGEOUT N/A 01/24/2018   Procedure: PPM GENERATOR CHANGEOUT;  Surgeon: Amy Klein, MD;  Location: [place] CV LAB;  Service: Cardiovascular;  Laterality: N/A;  . TOTAL HIP ARTHROPLASTY    . WRIST FRACTURE SURGERY  07/2012   left intra articular  with carpel tunnel    Current  Medications: Outpatient Medications Prior to Visit  Medication Sig Dispense Refill  . acetaminophen (TYLENOL) 500 MG tablet Take 1,000 mg by mouth every 6 (six) hours as needed for moderate pain.    Marland Kitchen albuterol (PROVENTIL HFA;VENTOLIN HFA) 108 (90 Base) MCG/ACT inhaler Inhale 2 puffs into the lungs every 6 (six) hours as needed for wheezing or shortness of breath.     . allopurinol (ZYLOPRIM) 100 MG tablet Take 100 mg by mouth daily.  2  . apixaban (ELIQUIS) 2.5 MG TABS tablet Take 1 tablet (2.5 mg total) by mouth 2 (two) times daily. 60 tablet 5  . Cholecalciferol (VITAMIN D3 PO) Take 1 tablet by mouth daily.    . cycloSPORINE (RESTASIS) 0.05 % ophthalmic emulsion Place 1 drop into both eyes 2 (two) times daily.    Marland Kitchen docusate sodium (COLACE) 100 MG capsule Take 100 mg by mouth 2 (two) times daily.    .  Fluticasone-Umeclidin-Vilant (TRELEGY ELLIPTA) 100-62.5-25 MCG/INH AEPB Inhale 1 puff into the lungs daily.     . furosemide (LASIX) 40 MG tablet Take 20 mg by mouth daily.     . Lactobacillus-Inulin (CULTURELLE DIGESTIVE HEALTH PO) Take 1 capsule by mouth daily.     Marland Kitchen levothyroxine (SYNTHROID, LEVOTHROID) 50 MCG tablet Take 50 mcg by mouth daily before breakfast.     . LORazepam (ATIVAN) 1 MG tablet Take 1 mg by mouth at bedtime as needed for anxiety or sleep.     . metoprolol succinate (TOPROL XL) 25 MG 24 hr tablet Take 1 tablet (25 mg total) by mouth daily. 90 tablet 3  . Misc Natural Products (TART CHERRY ADVANCED PO) Take 1 tablet by mouth daily.    . nitroGLYCERIN (NITROSTAT) 0.4 MG SL tablet Place 1 tablet (0.4 mg total) under the tongue every 5 (five) minutes x 3 doses as needed for chest pain. 25 tablet 12  . omeprazole (PRILOSEC) 40 MG capsule TAKE 1 CAPSULE BY MOUTH ONCE DAILY 90 capsule 3  . ondansetron (ZOFRAN) 4 MG tablet Take 1 tablet by mouth daily as needed.    . potassium chloride (K-DUR) 10 MEQ tablet Take 1 tablet (10 mEq total) by mouth daily. 90 tablet 1  . REPATHA SURECLICK 431 MG/ML SOAJ INJECT ONE INJECTION EVERY 2 WEEKS  5  . Wheat Dextrin (BENEFIBER DRINK MIX PO) Take 1 Dose by mouth 2 (two) times daily as needed (constipation).    . ranitidine (ZANTAC) 150 MG tablet TAKE ONE TABLET BY MOUTH ONCE DAILY AT BEDTIME (Patient not taking: Reported on 09/14/2018) 90 tablet 3   No facility-administered medications prior to visit.      Allergies:   Colestipol; Hydrocodone-acetaminophen; Niacin and related; Statins; and Zetia [ezetimibe]   Social History   Socioeconomic History  . Marital status: Widowed    Spouse name: Not on file  . Number of children: Not on file  . Years of education: Not on file  . Highest education level: Not on file  Occupational History  . Not on file  Social Needs  . Financial resource strain: Not on file  . Food insecurity:    Worry: Not  on file    Inability: Not on file  . Transportation needs:    Medical: Not on file    Non-medical: Not on file  Tobacco Use  . Smoking status: Never Smoker  . Smokeless tobacco: Never Used  Substance and Sexual Activity  . Alcohol use: No  . Drug use: No  . Sexual activity: Not Currently  Lifestyle  . Physical activity:    Days per week: Not on file    Minutes per session: Not on file  . Stress: Not on file  Relationships  . Social connections:    Talks on phone: Not on file    Gets together: Not on file    Attends religious service: Not on file    Active member of club or organization: Not on file    Attends meetings of clubs or organizations: Not on file    Relationship status: Not on file  Other Topics Concern  . Not on file  Social History Narrative  . Not on file     Family History:  The patient's family history includes Stroke in her father.   ROS:   Please see the history of present illness.    ROS all other systems are reviewed and are negative   PHYSICAL EXAM:   VS:  BP 138/76   Pulse 80   Ht 5' 5.5" (1.664 m)   Wt 171 lb 6.4 oz (77.7 kg)   BMI 28.09 kg/m      General: Alert, oriented x3, no distress, appears comfortable.  Healthy left subclavian pacemaker site Head: no evidence of trauma, PERRL, EOMI, no exophtalmos or lid lag, no myxedema, no xanthelasma; normal ears, nose and oropharynx Neck: normal jugular venous pulsations and no hepatojugular reflux; brisk carotid pulses without delay and no carotid bruits Chest: clear to auscultation, no signs of consolidation by percussion or palpation, normal fremitus, symmetrical and full respiratory excursions Cardiovascular: normal position and quality of the apical impulse, regular rhythm, normal first and paradoxically split second heart sounds, no murmurs, rubs or gallops Abdomen: no tenderness or distention, no masses by palpation, no abnormal pulsatility or arterial bruits, normal bowel sounds, no  hepatosplenomegaly Extremities: no clubbing, cyanosis or edema; 2+ radial, ulnar and brachial pulses bilaterally; 2+ right femoral, posterior tibial and dorsalis pedis pulses; 2+ left femoral, posterior tibial and dorsalis pedis pulses; no subclavian or femoral bruits Neurological: grossly nonfocal (known left visual field cut was not tested today) Psych: Normal mood and affect    Wt Readings from Last 3 Encounters:  09/14/18 171 lb 6.4 oz (77.7 kg)  08/09/18 172 lb 6.4 oz (78.2 kg)  07/25/18 173 lb (78.5 kg)    Studies/Labs Reviewed:   EKG:  EKG is not ordered today.  The intracardiac electrogram shows atrial fibrillation with mostly ventricular paced rhythm, occasional ventricular sensing.  There have been no episodes of high ventricular rate.   Lipid Panel    Component Value Date/Time   CHOL 193 08/13/2018 0955   TRIG 153 (H) 08/13/2018 0955   HDL 51 08/13/2018 0955   CHOLHDL 3.8 08/13/2018 0955   CHOLHDL 6.0 05/23/2018 0856   VLDL 45 (H) 05/23/2018 0856   LDLCALC 111 (H) 08/13/2018 0955   BMET    Component Value Date/Time   NA 141 08/13/2018 0955   K 4.6 08/13/2018 0955   CL 99 08/13/2018 0955   CO2 27 08/13/2018 0955   GLUCOSE 99 08/13/2018 0955   GLUCOSE 105 (H) 05/24/2018 0329   BUN 20 08/13/2018 0955   CREATININE 1.54 (H) 08/13/2018 0955   CREATININE 1.19 (H) 03/05/2015 1157   CALCIUM 10.0 08/13/2018 0955   GFRNONAA 29 (L) 08/13/2018 0955   GFRAA 33 (L) 08/13/2018 0955    ASSESSMENT:    1. Other persistent atrial fibrillation   2. Long term current use of anticoagulant   3. Chronic diastolic heart failure (  [place])   4. Coronary artery disease involving native coronary artery of native heart without angina pectoris   5. Hypercholesterolemia with LDL greater than 190 mg/dL   6. SSS (sick sinus syndrome) ([place])   7. Essential hypertension   8. Pacemaker   9. Left carotid stenosis      PLAN:  In order of problems listed above:  1. AFlutter/fib: Even though  the arrhythmia appears to be asymptomatic, her pacemaker shows a decreased activity level since she has been in persistent atrial fibrillation.  This might be because she is very wary of falling and injuries now that she is on anticoagulation, or may be the arrhythmia really does have impact on functional status.  We discussed ways to manage the arrhythmia.  I recommend a one-time trial of restoration of normal rhythm with cardioversion, to see if this will be associated with any meaningful functional status improvement.  She has been compliant with anticoagulation and I stressed the importance of maintaining compliance for at least 30 days after the cardioversion. This procedure has been fully reviewed with the patient and written informed consent has been obtained.  There is irregular with return of arrhythmia, I am not sure I would recommend aggressive antiarrhythmic therapy since the arrhythmia is not severely symptomatic. 2. Eliquis: So far well-tolerated CHADSVasc 8 (age 61, CVA 2, gender, HF, PAD/CAD, HTN).  Although I previously considered her too elderly and frail to start oral anticoagulation, the recent retinal artery embolism letter to the recommendation for oral anticoagulants.  Dose adjusted for age and reduced renal function.  She had serious complications after falls in 2013 and 2015, but no falls since. 3. CHF: Clinically euvolemic and asymptomatic, relatively sedentary.  Increase diuretics if she reaches 180 pounds when she appears to become symptomatic 4. CAD s/p CABG:  asymptomatic, angina free. 5. HLP: Started on Repatha about 3 months ago.  Recheck lipid profile shows a dramatic improvement in LDL cholesterol, a 57% reduction, although not at target less than 70 due to very high baseline LDL. 6. SSS: Roughly 65% atrial pacing with appropriate heart rate histograms for her level of activity, prior to the onset of atrial fibrillation. 7. HTN: Blood pressure is in ideal range.  I think we  should tolerate blood pressure recordings in the 140s and 150s to avoid falls from orthostatic hypotension. 8. PPM: Normal device function.  Remote downloads every 3 months and yearly office visits. 9. Moderate stenosis L ICA: Has a follow-up visit with Dr. Donzetta Matters but she is not a good candidate for carotid endarterectomy    Medication Adjustments/Labs and Tests Ordered: Current medicines are reviewed at length with the patient today.  Concerns regarding medicines are outlined above.  Medication changes, Labs and Tests ordered today are listed in the Patient Instructions below. Patient Instructions  Medication Instructions:  Dr Sallyanne Kuster recommends that you continue on your current medications as directed. Please refer to the Current Medication list given to you today.  If you need a refill on your cardiac medications before your next appointment, please call your pharmacy.   Lab work: Your physician recommends that you return for lab work TODAY.   If you have labs (blood work) drawn today and your tests are completely normal, you will receive your results only by: Marland Kitchen MyChart Message (if you have MyChart) OR . A paper copy in the mail If you have any lab test that is abnormal or we need to change your treatment, we will call you to review  the results.  Testing/Procedures: 1. Cardioversion - Your physician has recommended that you have a Cardioversion (DCCV). Electrical Cardioversion uses a jolt of electricity to your heart either through paddles or wired patches attached to your chest. This is a controlled, usually prescheduled, procedure. Defibrillation is done under light anesthesia in the hospital, and you usually go home the day of the procedure. This is done to get your heart back into a normal rhythm. You are not awake for the procedure. Please see the instruction sheet given to you today.  2. Remote monitoring is used to monitor your Pacemaker of ICD from home. This monitoring reduces the  number of office visits required to check your device to one time per year. It allows [place] to keep an eye on the functioning of your device to ensure it is working properly. You are scheduled for a device check from home on Friday, November 29th, 2019. You may send your transmission at any time that day. If you have a wireless device, the transmission will be sent automatically. After your physician reviews your transmission, you will receive a postcard with your next transmission date.  Follow-Up: At [facility], you and your health needs are our priority.  As part of our continuing mission to provide you with exceptional heart care, we have created designated Provider Care Teams.  These Care Teams include your primary Cardiologist (physician) and Advanced Practice Providers (APPs -  Physician Assistants and Nurse Practitioners) who all work together to provide you with the care you need, when you need it. You will need a follow up appointment in 6 months.  Please call our office 2 months in advance to schedule this appointment.  You may see Amy Klein, MD or one of the following Advanced Practice Providers on your designated Care Team: [place], [place] . Fabian Sharp, PA-C  Any Other Special Instructions Will Be Listed Below (If Applicable).   You are scheduled for a Cardioversion on Friday, November 22nd, 2019 with Dr. Sallyanne Kuster.  Please arrive at the [facility] (Main Entrance A) at [facility]: [address] [place], [place] [address] at 9:30 am.  DIET: Nothing to eat or drink after midnight except a sip of water with medications  Medication Instructions: Continue your anticoagulant Eliquis You will need to continue your anticoagulant after your procedure until you are told by your provider that it is safe to stop  Labs: You will have lab work completed today.  You must have a responsible person to drive you home and stay in the waiting area during your procedure. Failure to do so  could result in cancellation.  Bring your insurance cards.  *Special Note: Every effort is made to have your procedure done on time. Occasionally there are emergencies that occur at the hospital that may cause delays. Please be patient if a delay does occur.      Signed, Amy Klein, MD  09/14/2018 1:30 PM    [facility] [place], [place], [place]  [address] Phone: [phone]; Fax: [phone]

## 2018-09-17 ENCOUNTER — Other Ambulatory Visit: Payer: Self-pay

## 2018-09-17 ENCOUNTER — Telehealth: Payer: Self-pay | Admitting: Cardiovascular Disease

## 2018-09-17 NOTE — Telephone Encounter (Signed)
Spoke with pt daughter Juliann Pulse. Pt is scheduled to have dccv on 11/22. Juliann Pulse sts that the pt woke up this morning feeling a lot better. The heaviness in her chest has gone away and she is able take deep breaths, which is something she was not able to do before. Juliann Pulse is wondering if she sends a remote transmission to determine if the pt is still in Afib or id she has converted to NSR.  Cydney Ok to go ahead and send the transmission now and I will send a message to the device clinic to be on the look out.

## 2018-09-17 NOTE — Telephone Encounter (Signed)
Transmission received at 11:11. Amy [name] is still in AF, V rate (pacing) 95bpm.

## 2018-09-17 NOTE — Telephone Encounter (Signed)
Spoke with pt daughter Juliann Pulse. Adv her that the remote transmission was received by the device clinic and the pt is still in Afib. We should proceed with the plan of care with dccv on 11/22. Cydney Ok that they will ck the pt rhythm prior to the procedure and will not proceed if the pt has converted to NSR.  Nothing further currently needed. Cydney Ok to call the office if question are concerns arise prior to 09/21/18. Juliann Pulse agreeable with plan.

## 2018-09-17 NOTE — Telephone Encounter (Signed)
New Message:       Pt's daughter is calling and states the pt stated this morning that she felt like 15 cement blocks had been lifted off of her chest and this is the best she has felt in a long time. She states the pt is due to go into the hospital to have her heart shocked on 09/21/18 but was wondering if her monitor can be checked to see if the pt is still in afib

## 2018-09-20 ENCOUNTER — Ambulatory Visit (HOSPITAL_COMMUNITY)
Admission: RE | Admit: 2018-09-20 | Discharge: 2018-09-20 | Disposition: A | Discharge status: Home / Self Care | Payer: Medicare Other | Source: Ambulatory Visit | Attending: Family | Admitting: Family

## 2018-09-20 ENCOUNTER — Ambulatory Visit (INDEPENDENT_AMBULATORY_CARE_PROVIDER_SITE_OTHER): Payer: Medicare Other | Admitting: Family

## 2018-09-20 ENCOUNTER — Other Ambulatory Visit: Payer: Self-pay

## 2018-09-20 ENCOUNTER — Other Ambulatory Visit: Payer: Self-pay | Admitting: Cardiovascular Disease

## 2018-09-20 ENCOUNTER — Encounter: Payer: Self-pay | Admitting: Family

## 2018-09-20 VITALS — BP 140/71 | HR 77 | Temp 96.8°F | Resp 16 | Ht 65.0 in | Wt 170.0 lb

## 2018-09-20 DIAGNOSIS — I6523 Occlusion and stenosis of bilateral carotid arteries: Secondary | ICD-10-CM | POA: Insufficient documentation

## 2018-09-20 DIAGNOSIS — I4819 Other persistent atrial fibrillation: Secondary | ICD-10-CM

## 2018-09-20 NOTE — Patient Instructions (Signed)

## 2018-09-20 NOTE — Progress Notes (Signed)
Chief Complaint: Follow up Extracranial Carotid Artery Stenosis   History of Present Illness  Amy [name] is a 84 y.o. female who was admitted to [facility] from the ED for partial loss of vision in her left eye.    She was evaluated on 05-23-18 by Dr. Donzetta Matters and Jerilynn Mages. E[place] PA-C.  At that time Dr. Donzetta Matters discussed with the patient the 2 most likely etiologies for her retinal artery occlusion which would include embolic disease from her untreated paroxysmal atrial fibrillation versus from her moderate grade carotid stenosis which appears to be highly calcified.  He discussed with her the 2 options for treating the carotid stenosis which would be endarterectomy versus stenting and she is against surgery stating that she has previously been told she is not a candidate for general anesthesia.  That would leave [place] with stenting but at that time she also did not want to proceed with that.   Dr. Donzetta Matters advised  follow-up in the office in 3 months with repeat carotid duplex.  Certainly if she has any further symptoms we may want to reconsider prior.  Dr. Donzetta Matters would likely get a noncontrasted CT scan to evaluate the amount of calcium prior to deciding on stenting given that a highly calcified lesion may not really be amenable to stenting.  She demonstrated very good understanding of that discussion.  She is scheduled for cardioversion tomorrow by Dr. Ivy [name].   She states she has more energy in the last 3 days, pt states she checked with her cardiologist, and she is still in atrial fib.   She reports loss of vision in the lower half of her left eye, amaurosis fugax. She denies any subsequent neurological events since July 2019.    Diabetic: no Tobacco use: non-smoker, she handled tobacco, working in a Conservation officer, nature for 30 years.   Pt meds include: Statin : no, Takes Repatha  ASA: no Other anticoagulants/antiplatelets: Eliquis for atrial fib   Past Medical History:  Diagnosis Date  . Anxiety   .  Arthritis   . Asthma   . CAD in native artery    s/p CABG x 3; Last Myoview in 07/2009 - non-ischemic; Echo 03/2012  Aortic Sclerosis with normal EF.  . CKD (chronic kidney disease), stage III ([place])   . DVT (deep vein thrombosis) in pregnancy   . Esophageal perforation    9/13  . Fall 07/21/2014   FALL WITH INJURY  . Fracture of head of humerus   . Fracture of hip ([place]) 07/21/2014   LEFT  . GERD (gastroesophageal reflux disease)   . Hypercholesteremia   . Hypertension   . Hypothyroidism   . Loss of vision 05/22/2018   LEFT EYE  . Mediastinitis    s/p drainage  . Myocardial infarction [facility])    Inferior STEMI 07/2008 - PCI of prox RCA followed byu CABG x 3 in 9/'09  . Pacemaker   . PAF (paroxysmal atrial fibrillation) ([place])   . Paroxysmal atrial flutter ([place]) 11/26/2014  . Pneumonia   . Shortness of breath   . SSS (sick sinus syndrome) ([place]) 11/26/2014  . Status post hip hemiarthroplasty left hip by Dr Ronnie Derby 08/08/2012    Social History Social History   Tobacco Use  . Smoking status: Never Smoker  . Smokeless tobacco: Never Used  Substance Use Topics  . Alcohol use: No  . Drug use: No    Family History Family History  Problem Relation Age of Onset  . Stroke Father  Surgical History Past Surgical History:  Procedure Laterality Date  . CARPAL TUNNEL RELEASE  08/08/2012   Procedure: CARPAL TUNNEL RELEASE;  Surgeon: Schuyler Amor, MD;  Location: [place];  Service: Orthopedics;  Laterality: Left;  . CORONARY ARTERY BYPASS GRAFT  2009   LIMA-LAD, SVG-RI, SVG-stented RCA  . ESOPHAGOGASTRODUODENOSCOPY  08/10/2012   Procedure: ESOPHAGOGASTRODUODENOSCOPY (EGD);  Surgeon: Beryle Beams, MD;  Location: [facility] ENDOSCOPY;  Service: Endoscopy;  Laterality: N/A;  . ESOPHAGOSCOPY  08/10/2012   Procedure: ESOPHAGOSCOPY;  Surgeon: [name], MD;  Location: [place];  Service: ENT;  Laterality: N/A;  . GASTROSTOMY  08/16/2012   Procedure: GASTROSTOMY;  Surgeon: Zenovia Jarred,  MD;  Location: [place];  Service: General;  Laterality: N/A;  . HIP ARTHROPLASTY  08/08/2012   Procedure: ARTHROPLASTY BIPOLAR HIP;  Surgeon: Rudean Haskell, MD;  Location: [place];  Service: Orthopedics;  Laterality: Left;  [place]   . JOINT REPLACEMENT    . LEFT HEART CATHETERIZATION WITH CORONARY ANGIOGRAM N/A 04/16/2014   Procedure: LEFT HEART CATHETERIZATION WITH CORONARY ANGIOGRAM;  Surgeon: Wellington Hampshire, MD;  Location: [place] CATH LAB;  Service: Cardiovascular;  Laterality: N/A;  . NM MYOCAR PERF WALL MOTION  08/14/2009   Normal  . PACEMAKER INSERTION  01/06/2009   [employer]  . PPM GENERATOR CHANGEOUT N/A 01/24/2018   Procedure: PPM GENERATOR CHANGEOUT;  Surgeon: Sanda Klein, MD;  Location: [place] CV LAB;  Service: Cardiovascular;  Laterality: N/A;  . TOTAL HIP ARTHROPLASTY    . WRIST FRACTURE SURGERY  07/2012   left intra articular  with carpel tunnel    Allergies  Allergen Reactions  . Colestipol Other (See Comments)    Muscle aches  . Hydrocodone Itching  . Niacin And Related Other (See Comments)    Muscle pain  . Statins Other (See Comments)    Muscle aches on all she has tried.  She recalls Lipitor, Crestor, and Livalo but thinks there were others  . Zetia [Ezetimibe] Other (See Comments)    Muscle aches    Current Outpatient Medications  Medication Sig Dispense Refill  . acetaminophen (TYLENOL) 500 MG tablet Take 1,000 mg by mouth every 6 (six) hours as needed for moderate pain.    Marland Kitchen albuterol (PROVENTIL HFA;VENTOLIN HFA) 108 (90 Base) MCG/ACT inhaler Inhale 2 puffs into the lungs every 6 (six) hours as needed for wheezing or shortness of breath.     . allopurinol (ZYLOPRIM) 100 MG tablet Take 100 mg by mouth daily.  2  . apixaban (ELIQUIS) 2.5 MG TABS tablet Take 1 tablet (2.5 mg total) by mouth 2 (two) times daily. 60 tablet 5  . calcium carbonate (TUMS - DOSED IN MG ELEMENTAL CALCIUM) 500 MG chewable tablet Chew 2 tablets by mouth daily as needed for indigestion  or heartburn.    . Cholecalciferol (VITAMIN D3 PO) Take 1 tablet by mouth daily.    . cycloSPORINE (RESTASIS) 0.05 % ophthalmic emulsion Place 1 drop into both eyes 2 (two) times daily.    Marland Kitchen docusate sodium (COLACE) 100 MG capsule Take 100 mg by mouth 2 (two) times daily.    . Fluticasone-Umeclidin-Vilant (TRELEGY ELLIPTA) 100-62.5-25 MCG/INH AEPB Inhale 1 puff into the lungs daily.     . furosemide (LASIX) 40 MG tablet Take 20 mg by mouth daily.     Marland Kitchen levothyroxine (SYNTHROID, LEVOTHROID) 50 MCG tablet Take 50 mcg by mouth daily before breakfast.     . loratadine (CLARITIN) 10 MG tablet Take 10 mg  by mouth daily as needed for allergies.    Marland Kitchen LORazepam (ATIVAN) 1 MG tablet Take 0.5 mg by mouth at bedtime as needed for anxiety or sleep.     . metoprolol succinate (TOPROL XL) 25 MG 24 hr tablet Take 1 tablet (25 mg total) by mouth daily. (Patient taking differently: Take 25 mg by mouth at bedtime. ) 90 tablet 3  . Misc Natural Products (TART CHERRY ADVANCED PO) Take 1 tablet by mouth daily.    . nitroGLYCERIN (NITROSTAT) 0.4 MG SL tablet Place 1 tablet (0.4 mg total) under the tongue every 5 (five) minutes x 3 doses as needed for chest pain. 25 tablet 12  . omeprazole (PRILOSEC) 40 MG capsule TAKE 1 CAPSULE BY MOUTH ONCE DAILY 90 capsule 3  . ondansetron (ZOFRAN) 4 MG tablet Take 1 tablet by mouth every 8 (eight) hours as needed for nausea or vomiting.     . potassium chloride (K-DUR) 10 MEQ tablet Take 1 tablet (10 mEq total) by mouth daily. 90 tablet 1  . Probiotic CAPS Take 1 capsule by mouth daily.    Marland Kitchen REPATHA SURECLICK 510 MG/ML SOAJ Inject 140 mg as directed every 14 (fourteen) days.   5  . Wheat Dextrin (BENEFIBER DRINK MIX PO) Take 1 Dose by mouth daily.      No current facility-administered medications for this visit.     Review of Systems : See HPI for pertinent positives and negatives.  Physical Examination  Vitals:   09/20/18 0852 09/20/18 0857  BP: (!) 158/79 140/71  Pulse:  77 77  Resp: 16   Temp: (!) 96.8 F (36 C)   TempSrc: Oral   SpO2: 97%   Weight: 170 lb (77.1 kg)   Height: 5\' 5"  (1.651 m)    Body mass index is 28.29 kg/m.  General: WDWN elderly female, appears younger than stated age, in NAD, accompanied by her daughter GAIT: using rolling walker Eyes: PERRLA HENT: No gross abnormalities.  Pulmonary:  Respirations are non-labored, good air movement in all fields, CTAB, no rales, rhonchi, or wheezes. Cardiac: regular rhythm and rate no detected murmur. Pacemaker palpated left upper chest.   VASCULAR EXAM Carotid Bruits Right Left   Negative Negative     Abdominal aortic pulse is not palpable. Radial pulses are 2+ palpable and equal.                                                                                                                            LE Pulses Right Left       POPLITEAL  not palpable   not palpable       POSTERIOR TIBIAL  not palpable   1+ palpable        DORSALIS PEDIS      ANTERIOR TIBIAL not palpable  not palpable     Gastrointestinal: soft, nontender, BS WNL, no r/g, no palpable masses. Musculoskeletal: no muscle atrophy/wasting. M/S 4/5 throughout, extremities without ischemic changes.  Skin: No rashes, no ulcers, no cellulitis.   Neurologic:  A&O X 3; appropriate affect, sensation is normal; speech is normal, CN 2-12 intact except has mild hearing loss, pain and light touch intact in extremities, motor exam as listed above. Psychiatric: Normal thought content, mood appropriate to clinical situation.    Assessment: Amy Amy [name] EDDS is a 84 y.o. 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 The lower half of the field of vision in her left eye is gone since the stroke. She has no lateralizing weakness, no speech difficulties.  She has not had any subsequent neurologic event.   In July 2019, Dr. Donzetta Matters discussed with the patient the 2 most likely etiologies for her retinal artery occlusion which would include  embolic disease from her untreated paroxysmal atrial fibrillation versus from her moderate grade carotid stenosis which appears to be highly calcified.  He discussed with her the 2 options for treating the carotid stenosis which would be endarterectomy versus stenting and she is against surgery stating that she has previously been told she is not a candidate for general anesthesia.  That would leave [place] with stenting but at that time she also did not want to proceed with that.       DATA Carotid Duplex (09-20-18): Right ICA: 40-59% stenosis Left ICA: 1-39% stenosis Left CCA: >50% stenosis Bilateral vertebral artery flow is antegrade.  Bilateral subclavian artery waveforms are normal.  No significant change compared to the exam on 05-22-18.    Plan: Follow-up in 6 months with Carotid Duplex scan.   I discussed in depth with the patient the nature of atherosclerosis, and emphasized the importance of maximal medical management including strict control of blood pressure, blood glucose, and lipid levels, obtaining regular exercise, and continued cessation of smoking.  The patient is aware that without maximal medical management the underlying atherosclerotic disease process will progress, limiting the benefit of any interventions. The patient was given information about stroke prevention and what symptoms should prompt the patient to seek immediate medical care. Thank you for allowing [place] to participate in this patient's care.  Clemon Chambers, RN, MSN, FNP-C [facility] Office: [phone]  Clinic Physician: Oneida Alar  09/20/18 9:20 AM

## 2018-09-21 ENCOUNTER — Ambulatory Visit (HOSPITAL_COMMUNITY)
Admission: RE | Admit: 2018-09-21 | Discharge: 2018-09-21 | Disposition: A | Discharge status: Home / Self Care | Payer: Medicare Other | Source: Ambulatory Visit | Attending: Cardiovascular Disease | Admitting: Cardiovascular Disease

## 2018-09-21 ENCOUNTER — Other Ambulatory Visit: Payer: Self-pay

## 2018-09-21 ENCOUNTER — Ambulatory Visit (HOSPITAL_COMMUNITY): Payer: Medicare Other | Admitting: Anesthesiology

## 2018-09-21 ENCOUNTER — Encounter (HOSPITAL_COMMUNITY)
Admission: RE | Disposition: A | Discharge status: Home / Self Care | Payer: Self-pay | Source: Ambulatory Visit | Attending: Cardiovascular Disease

## 2018-09-21 DIAGNOSIS — I4891 Unspecified atrial fibrillation: Secondary | ICD-10-CM | POA: Diagnosis not present

## 2018-09-21 DIAGNOSIS — I6522 Occlusion and stenosis of left carotid artery: Secondary | ICD-10-CM | POA: Insufficient documentation

## 2018-09-21 DIAGNOSIS — I130 Hypertensive heart and chronic kidney disease with heart failure and stage 1 through stage 4 chronic kidney disease, or unspecified chronic kidney disease: Secondary | ICD-10-CM | POA: Diagnosis not present

## 2018-09-21 DIAGNOSIS — I252 Old myocardial infarction: Secondary | ICD-10-CM | POA: Insufficient documentation

## 2018-09-21 DIAGNOSIS — Z96642 Presence of left artificial hip joint: Secondary | ICD-10-CM | POA: Diagnosis not present

## 2018-09-21 DIAGNOSIS — Z79899 Other long term (current) drug therapy: Secondary | ICD-10-CM | POA: Diagnosis not present

## 2018-09-21 DIAGNOSIS — E039 Hypothyroidism, unspecified: Secondary | ICD-10-CM | POA: Diagnosis not present

## 2018-09-21 DIAGNOSIS — I5032 Chronic diastolic (congestive) heart failure: Secondary | ICD-10-CM | POA: Diagnosis not present

## 2018-09-21 DIAGNOSIS — I4892 Unspecified atrial flutter: Secondary | ICD-10-CM | POA: Diagnosis not present

## 2018-09-21 DIAGNOSIS — E785 Hyperlipidemia, unspecified: Secondary | ICD-10-CM | POA: Diagnosis not present

## 2018-09-21 DIAGNOSIS — J45909 Unspecified asthma, uncomplicated: Secondary | ICD-10-CM | POA: Diagnosis not present

## 2018-09-21 DIAGNOSIS — Z86718 Personal history of other venous thrombosis and embolism: Secondary | ICD-10-CM | POA: Diagnosis not present

## 2018-09-21 DIAGNOSIS — Z951 Presence of aortocoronary bypass graft: Secondary | ICD-10-CM | POA: Insufficient documentation

## 2018-09-21 DIAGNOSIS — N183 Chronic kidney disease, stage 3 (moderate): Secondary | ICD-10-CM | POA: Diagnosis not present

## 2018-09-21 DIAGNOSIS — K219 Gastro-esophageal reflux disease without esophagitis: Secondary | ICD-10-CM | POA: Diagnosis not present

## 2018-09-21 DIAGNOSIS — I4819 Other persistent atrial fibrillation: Secondary | ICD-10-CM | POA: Diagnosis not present

## 2018-09-21 DIAGNOSIS — I495 Sick sinus syndrome: Secondary | ICD-10-CM | POA: Insufficient documentation

## 2018-09-21 DIAGNOSIS — F419 Anxiety disorder, unspecified: Secondary | ICD-10-CM | POA: Diagnosis not present

## 2018-09-21 DIAGNOSIS — Z45018 Encounter for adjustment and management of other part of cardiac pacemaker: Secondary | ICD-10-CM | POA: Insufficient documentation

## 2018-09-21 DIAGNOSIS — Z7901 Long term (current) use of anticoagulants: Secondary | ICD-10-CM | POA: Diagnosis not present

## 2018-09-21 DIAGNOSIS — Z79890 Hormone replacement therapy (postmenopausal): Secondary | ICD-10-CM | POA: Diagnosis not present

## 2018-09-21 DIAGNOSIS — E7800 Pure hypercholesterolemia, unspecified: Secondary | ICD-10-CM | POA: Insufficient documentation

## 2018-09-21 DIAGNOSIS — Z7951 Long term (current) use of inhaled steroids: Secondary | ICD-10-CM | POA: Insufficient documentation

## 2018-09-21 DIAGNOSIS — I2510 Atherosclerotic heart disease of native coronary artery without angina pectoris: Secondary | ICD-10-CM | POA: Insufficient documentation

## 2018-09-21 HISTORY — PX: CARDIOVERSION: SHX1299

## 2018-09-21 SURGERY — CARDIOVERSION
Anesthesia: General

## 2018-09-21 MED ORDER — HYDROCORTISONE 1 % EX CREA
1.0000 "application " | TOPICAL_CREAM | Freq: Three times a day (TID) | CUTANEOUS | Status: DC | PRN
  Filled 2018-09-21: qty 28

## 2018-09-21 MED ORDER — LIDOCAINE HCL (CARDIAC) PF 100 MG/5ML IV SOSY
PREFILLED_SYRINGE | INTRAVENOUS | Status: DC | PRN
  Administered 2018-09-21: 60 mg via INTRAVENOUS

## 2018-09-21 MED ORDER — PROPOFOL 10 MG/ML IV BOLUS
INTRAVENOUS | Status: DC | PRN
  Administered 2018-09-21: 50 mg via INTRAVENOUS

## 2018-09-21 MED ORDER — SODIUM CHLORIDE 0.9 % IV SOLN
INTRAVENOUS | Status: DC
  Administered 2018-09-21: 500 mL via INTRAVENOUS

## 2018-09-21 NOTE — Discharge Instructions (Signed)
Electrical Cardioversion, Care After °This sheet gives you information about how to care for yourself after your procedure. Your health care provider may also give you more specific instructions. If you have problems or questions, contact your health care provider. °What can I expect after the procedure? °After the procedure, it is common to have: °· Some redness on the skin where the shocks were given. ° °Follow these instructions at home: °· Do not drive for 24 hours if you were given a medicine to help you relax (sedative). °· Take over-the-counter and prescription medicines only as told by your health care provider. °· Ask your health care provider how to check your pulse. Check it often. °· Rest for 48 hours after the procedure or as told by your health care provider. °· Avoid or limit your caffeine use as told by your health care provider. °Contact a health care provider if: °· You feel like your heart is beating too quickly or your pulse is not regular. °· You have a serious muscle cramp that does not go away. °Get help right away if: °· You have discomfort in your chest. °· You are dizzy or you feel faint. °· You have trouble breathing or you are short of breath. °· Your speech is slurred. °· You have trouble moving an arm or leg on one side of your body. °· Your fingers or toes turn cold or blue. °This information is not intended to replace advice given to you by your health care provider. Make sure you discuss any questions you have with your health care provider. °Document Released: 08/07/2013 Document Revised: 05/20/2016 Document Reviewed: 04/22/2016 °Elsevier Interactive Patient Education © 2018 Elsevier Inc. ° °

## 2018-09-21 NOTE — Op Note (Signed)
Procedure: Electrical Cardioversion Indications:  Atrial Fibrillation  Procedure Details:  Consent: Risks of procedure as well as the alternatives and risks of each were explained to the (patient/caregiver).  Consent for procedure obtained.  Time Out: Verified patient identification, verified procedure, site/side was marked, verified correct patient position, special equipment/implants available, medications/allergies/relevent history reviewed, required imaging and test results available.  Performed  Patient placed on cardiac monitor, pulse oximetry, supplemental oxygen as necessary.  Sedation given: Dr. Tobias [name], propofol 50 mg IV Pacer pads placed anterior and posterior chest.  Cardioverted 1 time(s).  Cardioversion with synchronized biphasic 120J shock.  Evaluation: Findings: Post procedure EKG shows: A paced, V paced rhythm Complications: None Patient did tolerate procedure well.  Comprehensive pacemaker check after the procedure shows normal device operation.  Time Spent Directly with the Patient:  30 minutes   Amy [name] 09/21/2018, 10:52 AM

## 2018-09-21 NOTE — Transfer of Care (Signed)
Immediate Anesthesia Transfer of Care Note  Patient: Amy [name]  Procedure(s) Performed: CARDIOVERSION (N/A )  Patient Location: Endoscopy Unit  Anesthesia Type:General  Level of Consciousness: drowsy  Airway & Oxygen Therapy: Patient Spontanous Breathing  Post-op Assessment: Report given to RN and Post -op Vital signs reviewed and stable  Post vital signs: Reviewed and stable  Last Vitals:  Vitals Value Taken Time  BP    Temp    Pulse    Resp    SpO2      Last Pain:  Vitals:   09/21/18 1018  TempSrc:   PainSc: 0-No pain         Complications: No apparent anesthesia complications

## 2018-09-21 NOTE — Interval H&P Note (Signed)
History and Physical Interval Note:  09/21/2018 9:31 AM  Amy [name]  has presented today for surgery, with the diagnosis of AFIB  The various methods of treatment have been discussed with the patient and family. After consideration of risks, benefits and other options for treatment, the patient has consented to  Procedure(s): CARDIOVERSION (N/A) as a surgical intervention .  The patient's history has been reviewed, patient examined, no change in status, stable for surgery.  I have reviewed the patient's chart and labs.  Questions were answered to the patient's satisfaction.     Madhav Mohon

## 2018-09-21 NOTE — Anesthesia Preprocedure Evaluation (Signed)
Anesthesia Evaluation  Patient identified by MRN, date of birth, ID band Patient awake    Reviewed: Allergy & Precautions, H&P , NPO status , Patient's Chart, lab work & pertinent test results  History of Anesthesia Complications Negative for: history of anesthetic complications  Airway Mallampati: II       Dental  (+) Teeth Intact, Dental Advisory Given   Pulmonary neg pulmonary ROS,    breath sounds clear to auscultation + decreased breath sounds      Cardiovascular hypertension, + angina + CAD and + Past MI  + dysrhythmias Atrial Fibrillation + Valvular Problems/Murmurs  Rhythm:Regular Rate:Normal     Neuro/Psych Anxiety negative neurological ROS     GI/Hepatic Neg liver ROS, GERD  ,  Endo/Other  Hypothyroidism   Renal/GU Renal InsufficiencyRenal disease     Musculoskeletal   Abdominal   Peds  Hematology   Anesthesia Other Findings   Reproductive/Obstetrics                             Anesthesia Physical  Anesthesia Plan  ASA: III  Anesthesia Plan: General   Post-op Pain Management:    Induction: Intravenous  PONV Risk Score and Plan:   Airway Management Planned: Mask  Additional Equipment:   Intra-op Plan:   Post-operative Plan:   Informed Consent: I have reviewed the patients History and Physical, chart, labs and discussed the procedure including the risks, benefits and alternatives for the proposed anesthesia with the patient or authorized representative who has indicated his/her understanding and acceptance.   Dental advisory given  Plan Discussed with: CRNA, Surgeon and Anesthesiologist  Anesthesia Plan Comments:         Anesthesia Quick Evaluation

## 2018-09-21 NOTE — Anesthesia Postprocedure Evaluation (Signed)
Anesthesia Post Note  Patient: Amy [name]  Procedure(s) Performed: CARDIOVERSION (N/A )     Patient location during evaluation: PACU Anesthesia Type: General Level of consciousness: sedated Pain management: pain level controlled Vital Signs Assessment: post-procedure vital signs reviewed and stable Respiratory status: spontaneous breathing and respiratory function stable Cardiovascular status: stable Postop Assessment: no apparent nausea or vomiting Anesthetic complications: no    Last Vitals:  Vitals:   09/21/18 1035 09/21/18 1045  BP: (!) 99/47 123/71  Pulse: 67 69  Resp: 18 18  Temp: 37 C   SpO2: 99% 97%    Last Pain:  Vitals:   09/21/18 1035  TempSrc: Oral  PainSc: 0-No pain                 Ozelle Brubacher DANIEL

## 2018-09-24 ENCOUNTER — Encounter (HOSPITAL_COMMUNITY): Payer: Self-pay | Admitting: Cardiovascular Disease

## 2018-11-06 ENCOUNTER — Ambulatory Visit (INDEPENDENT_AMBULATORY_CARE_PROVIDER_SITE_OTHER): Payer: Medicare Other

## 2018-11-06 DIAGNOSIS — I495 Sick sinus syndrome: Secondary | ICD-10-CM | POA: Diagnosis not present

## 2018-11-07 NOTE — Progress Notes (Signed)
Remote pacemaker transmission.   

## 2018-11-08 LAB — CUP PACEART REMOTE DEVICE CHECK
Battery Remaining Longevity: 160 mo
Brady Statistic AP VS Percent: 27.98 %
Brady Statistic AS VP Percent: 0.11 %
Brady Statistic AS VS Percent: 71.83 %
Brady Statistic RA Percent Paced: 28.04 %
Brady Statistic RV Percent Paced: 0.29 %
Date Time Interrogation Session: 20200107060156
Implantable Lead Implant Date: 20100309
Implantable Lead Location: 753859
Implantable Lead Model: 5076
Implantable Pulse Generator Implant Date: 20190327
Lead Channel Impedance Value: 323 Ohm
Lead Channel Impedance Value: 399 Ohm
Lead Channel Impedance Value: 779 Ohm
Lead Channel Pacing Threshold Amplitude: 0.5 V
Lead Channel Pacing Threshold Pulse Width: 0.4 ms
Lead Channel Sensing Intrinsic Amplitude: 16.125 mV
Lead Channel Sensing Intrinsic Amplitude: 16.125 mV
Lead Channel Sensing Intrinsic Amplitude: 3.375 mV
Lead Channel Sensing Intrinsic Amplitude: 3.375 mV
Lead Channel Setting Pacing Amplitude: 2.5 V
Lead Channel Setting Pacing Pulse Width: 0.4 ms
MDC IDC LEAD IMPLANT DT: 20100309
MDC IDC LEAD LOCATION: 753860
MDC IDC MSMT BATTERY VOLTAGE: 3.1 V
MDC IDC MSMT LEADCHNL RA PACING THRESHOLD AMPLITUDE: 0.625 V
MDC IDC MSMT LEADCHNL RA PACING THRESHOLD PULSEWIDTH: 0.4 ms
MDC IDC MSMT LEADCHNL RV IMPEDANCE VALUE: 722 Ohm
MDC IDC SET LEADCHNL RA PACING AMPLITUDE: 2 V
MDC IDC SET LEADCHNL RV SENSING SENSITIVITY: 1.2 mV
MDC IDC STAT BRADY AP VP PERCENT: 0.07 %

## 2018-11-13 DIAGNOSIS — L570 Actinic keratosis: Secondary | ICD-10-CM | POA: Diagnosis not present

## 2018-11-19 DIAGNOSIS — M1000 Idiopathic gout, unspecified site: Secondary | ICD-10-CM | POA: Diagnosis not present

## 2018-11-19 DIAGNOSIS — E782 Mixed hyperlipidemia: Secondary | ICD-10-CM | POA: Diagnosis not present

## 2018-11-19 DIAGNOSIS — N184 Chronic kidney disease, stage 4 (severe): Secondary | ICD-10-CM | POA: Diagnosis not present

## 2018-11-19 DIAGNOSIS — M1A00X0 Idiopathic chronic gout, unspecified site, without tophus (tophi): Secondary | ICD-10-CM | POA: Diagnosis not present

## 2018-11-19 DIAGNOSIS — E7800 Pure hypercholesterolemia, unspecified: Secondary | ICD-10-CM | POA: Diagnosis not present

## 2018-11-19 DIAGNOSIS — I5032 Chronic diastolic (congestive) heart failure: Secondary | ICD-10-CM | POA: Diagnosis not present

## 2018-11-19 DIAGNOSIS — E039 Hypothyroidism, unspecified: Secondary | ICD-10-CM | POA: Diagnosis not present

## 2018-11-19 DIAGNOSIS — Z87440 Personal history of urinary (tract) infections: Secondary | ICD-10-CM | POA: Diagnosis not present

## 2018-11-19 DIAGNOSIS — I130 Hypertensive heart and chronic kidney disease with heart failure and stage 1 through stage 4 chronic kidney disease, or unspecified chronic kidney disease: Secondary | ICD-10-CM | POA: Diagnosis not present

## 2018-11-19 DIAGNOSIS — I10 Essential (primary) hypertension: Secondary | ICD-10-CM | POA: Diagnosis not present

## 2018-11-19 DIAGNOSIS — Z6828 Body mass index (BMI) 28.0-28.9, adult: Secondary | ICD-10-CM | POA: Diagnosis not present

## 2018-11-19 DIAGNOSIS — M79672 Pain in left foot: Secondary | ICD-10-CM | POA: Diagnosis not present

## 2018-11-22 DIAGNOSIS — E7800 Pure hypercholesterolemia, unspecified: Secondary | ICD-10-CM | POA: Diagnosis not present

## 2018-11-22 DIAGNOSIS — N184 Chronic kidney disease, stage 4 (severe): Secondary | ICD-10-CM | POA: Diagnosis not present

## 2018-11-22 DIAGNOSIS — I10 Essential (primary) hypertension: Secondary | ICD-10-CM | POA: Diagnosis not present

## 2018-11-22 DIAGNOSIS — J449 Chronic obstructive pulmonary disease, unspecified: Secondary | ICD-10-CM | POA: Diagnosis not present

## 2018-11-22 DIAGNOSIS — I480 Paroxysmal atrial fibrillation: Secondary | ICD-10-CM | POA: Diagnosis not present

## 2018-11-22 DIAGNOSIS — G4700 Insomnia, unspecified: Secondary | ICD-10-CM | POA: Diagnosis not present

## 2018-11-22 DIAGNOSIS — R7301 Impaired fasting glucose: Secondary | ICD-10-CM | POA: Diagnosis not present

## 2018-11-22 DIAGNOSIS — H5410 Blindness, one eye, low vision other eye, unspecified eyes: Secondary | ICD-10-CM | POA: Diagnosis not present

## 2018-11-22 DIAGNOSIS — I6529 Occlusion and stenosis of unspecified carotid artery: Secondary | ICD-10-CM | POA: Diagnosis not present

## 2018-11-22 DIAGNOSIS — I2510 Atherosclerotic heart disease of native coronary artery without angina pectoris: Secondary | ICD-10-CM | POA: Diagnosis not present

## 2018-11-22 DIAGNOSIS — I5032 Chronic diastolic (congestive) heart failure: Secondary | ICD-10-CM | POA: Diagnosis not present

## 2018-11-22 DIAGNOSIS — E790 Hyperuricemia without signs of inflammatory arthritis and tophaceous disease: Secondary | ICD-10-CM | POA: Diagnosis not present

## 2018-11-30 LAB — CUP PACEART INCLINIC DEVICE CHECK
Date Time Interrogation Session: 20200131151055
Implantable Lead Implant Date: 20100309
Implantable Lead Implant Date: 20100309
Implantable Lead Location: 753859
Implantable Lead Location: 753859
Implantable Lead Location: 753860
Implantable Lead Model: 5076
Implantable Lead Model: 5076
Implantable Pulse Generator Implant Date: 20190327
MDC IDC LEAD IMPLANT DT: 20100309
MDC IDC LEAD IMPLANT DT: 20100309
MDC IDC LEAD LOCATION: 753860
MDC IDC PG IMPLANT DT: 20190327
MDC IDC SESS DTM: 20200131141020

## 2019-01-09 ENCOUNTER — Other Ambulatory Visit: Payer: Self-pay | Admitting: Cardiovascular Disease

## 2019-02-05 ENCOUNTER — Ambulatory Visit (INDEPENDENT_AMBULATORY_CARE_PROVIDER_SITE_OTHER): Payer: Medicare Other | Admitting: *Deleted

## 2019-02-05 ENCOUNTER — Other Ambulatory Visit: Payer: Self-pay

## 2019-02-05 DIAGNOSIS — I495 Sick sinus syndrome: Secondary | ICD-10-CM | POA: Diagnosis not present

## 2019-02-05 DIAGNOSIS — R001 Bradycardia, unspecified: Secondary | ICD-10-CM

## 2019-02-05 LAB — CUP PACEART REMOTE DEVICE CHECK
Battery Remaining Longevity: 157 mo
Battery Voltage: 3.07 V
Brady Statistic AP VP Percent: 0.03 %
Brady Statistic AP VS Percent: 44.2 %
Brady Statistic AS VP Percent: 0.06 %
Brady Statistic AS VS Percent: 55.71 %
Brady Statistic RA Percent Paced: 44.38 %
Brady Statistic RV Percent Paced: 0.09 %
Date Time Interrogation Session: 20200407061604
Implantable Lead Implant Date: 20100309
Implantable Lead Implant Date: 20100309
Implantable Lead Location: 753859
Implantable Lead Location: 753860
Implantable Lead Model: 5076
Implantable Lead Model: 5076
Implantable Pulse Generator Implant Date: 20190327
Lead Channel Impedance Value: 342 Ohm
Lead Channel Impedance Value: 399 Ohm
Lead Channel Impedance Value: 722 Ohm
Lead Channel Impedance Value: 760 Ohm
Lead Channel Pacing Threshold Amplitude: 0.375 V
Lead Channel Pacing Threshold Amplitude: 0.5 V
Lead Channel Pacing Threshold Pulse Width: 0.4 ms
Lead Channel Pacing Threshold Pulse Width: 0.4 ms
Lead Channel Sensing Intrinsic Amplitude: 14.125 mV
Lead Channel Sensing Intrinsic Amplitude: 14.125 mV
Lead Channel Sensing Intrinsic Amplitude: 2.75 mV
Lead Channel Sensing Intrinsic Amplitude: 2.75 mV
Lead Channel Setting Pacing Amplitude: 2 V
Lead Channel Setting Pacing Amplitude: 2.5 V
Lead Channel Setting Pacing Pulse Width: 0.4 ms
Lead Channel Setting Sensing Sensitivity: 1.2 mV

## 2019-02-13 NOTE — Progress Notes (Signed)
Remote pacemaker transmission.   

## 2019-02-14 ENCOUNTER — Other Ambulatory Visit: Payer: Self-pay | Admitting: Cardiovascular Disease

## 2019-02-14 NOTE — Telephone Encounter (Signed)
Furosemide refilled. 

## 2019-02-25 DIAGNOSIS — N184 Chronic kidney disease, stage 4 (severe): Secondary | ICD-10-CM | POA: Diagnosis not present

## 2019-02-25 DIAGNOSIS — J069 Acute upper respiratory infection, unspecified: Secondary | ICD-10-CM | POA: Diagnosis not present

## 2019-02-25 DIAGNOSIS — E7800 Pure hypercholesterolemia, unspecified: Secondary | ICD-10-CM | POA: Diagnosis not present

## 2019-02-25 DIAGNOSIS — Z09 Encounter for follow-up examination after completed treatment for conditions other than malignant neoplasm: Secondary | ICD-10-CM | POA: Diagnosis not present

## 2019-02-25 DIAGNOSIS — R7301 Impaired fasting glucose: Secondary | ICD-10-CM | POA: Diagnosis not present

## 2019-02-25 DIAGNOSIS — H53133 Sudden visual loss, bilateral: Secondary | ICD-10-CM | POA: Diagnosis not present

## 2019-02-25 DIAGNOSIS — R300 Dysuria: Secondary | ICD-10-CM | POA: Diagnosis not present

## 2019-02-25 DIAGNOSIS — R001 Bradycardia, unspecified: Secondary | ICD-10-CM | POA: Diagnosis not present

## 2019-02-25 DIAGNOSIS — I2510 Atherosclerotic heart disease of native coronary artery without angina pectoris: Secondary | ICD-10-CM | POA: Diagnosis not present

## 2019-02-25 DIAGNOSIS — M6281 Muscle weakness (generalized): Secondary | ICD-10-CM | POA: Diagnosis not present

## 2019-02-25 DIAGNOSIS — R05 Cough: Secondary | ICD-10-CM | POA: Diagnosis not present

## 2019-02-25 DIAGNOSIS — I5030 Unspecified diastolic (congestive) heart failure: Secondary | ICD-10-CM | POA: Diagnosis not present

## 2019-03-06 DIAGNOSIS — N184 Chronic kidney disease, stage 4 (severe): Secondary | ICD-10-CM | POA: Diagnosis not present

## 2019-03-06 DIAGNOSIS — E7800 Pure hypercholesterolemia, unspecified: Secondary | ICD-10-CM | POA: Diagnosis not present

## 2019-03-06 DIAGNOSIS — I10 Essential (primary) hypertension: Secondary | ICD-10-CM | POA: Diagnosis not present

## 2019-03-06 DIAGNOSIS — J449 Chronic obstructive pulmonary disease, unspecified: Secondary | ICD-10-CM | POA: Diagnosis not present

## 2019-03-07 DIAGNOSIS — E7800 Pure hypercholesterolemia, unspecified: Secondary | ICD-10-CM | POA: Diagnosis not present

## 2019-03-07 DIAGNOSIS — I5032 Chronic diastolic (congestive) heart failure: Secondary | ICD-10-CM | POA: Diagnosis not present

## 2019-03-07 DIAGNOSIS — I10 Essential (primary) hypertension: Secondary | ICD-10-CM | POA: Diagnosis not present

## 2019-03-07 DIAGNOSIS — E790 Hyperuricemia without signs of inflammatory arthritis and tophaceous disease: Secondary | ICD-10-CM | POA: Diagnosis not present

## 2019-03-07 DIAGNOSIS — I480 Paroxysmal atrial fibrillation: Secondary | ICD-10-CM | POA: Diagnosis not present

## 2019-03-07 DIAGNOSIS — R7301 Impaired fasting glucose: Secondary | ICD-10-CM | POA: Diagnosis not present

## 2019-03-07 DIAGNOSIS — G4700 Insomnia, unspecified: Secondary | ICD-10-CM | POA: Diagnosis not present

## 2019-03-07 DIAGNOSIS — I2510 Atherosclerotic heart disease of native coronary artery without angina pectoris: Secondary | ICD-10-CM | POA: Diagnosis not present

## 2019-03-07 DIAGNOSIS — J449 Chronic obstructive pulmonary disease, unspecified: Secondary | ICD-10-CM | POA: Diagnosis not present

## 2019-03-07 DIAGNOSIS — N184 Chronic kidney disease, stage 4 (severe): Secondary | ICD-10-CM | POA: Diagnosis not present

## 2019-03-07 DIAGNOSIS — I6529 Occlusion and stenosis of unspecified carotid artery: Secondary | ICD-10-CM | POA: Diagnosis not present

## 2019-03-07 DIAGNOSIS — K5900 Constipation, unspecified: Secondary | ICD-10-CM | POA: Diagnosis not present

## 2019-03-21 ENCOUNTER — Encounter (HOSPITAL_COMMUNITY): Payer: Medicare Other

## 2019-03-21 ENCOUNTER — Ambulatory Visit: Payer: Medicare Other | Admitting: Family

## 2019-04-01 ENCOUNTER — Other Ambulatory Visit: Payer: Self-pay | Admitting: Cardiovascular Disease

## 2019-04-01 DIAGNOSIS — I480 Paroxysmal atrial fibrillation: Secondary | ICD-10-CM

## 2019-04-01 DIAGNOSIS — Z0000 Encounter for general adult medical examination without abnormal findings: Secondary | ICD-10-CM | POA: Diagnosis not present

## 2019-04-01 DIAGNOSIS — I495 Sick sinus syndrome: Secondary | ICD-10-CM

## 2019-04-02 DIAGNOSIS — I10 Essential (primary) hypertension: Secondary | ICD-10-CM | POA: Diagnosis not present

## 2019-04-02 DIAGNOSIS — E790 Hyperuricemia without signs of inflammatory arthritis and tophaceous disease: Secondary | ICD-10-CM | POA: Diagnosis not present

## 2019-04-02 DIAGNOSIS — I480 Paroxysmal atrial fibrillation: Secondary | ICD-10-CM | POA: Diagnosis not present

## 2019-04-02 DIAGNOSIS — N184 Chronic kidney disease, stage 4 (severe): Secondary | ICD-10-CM | POA: Diagnosis not present

## 2019-04-02 DIAGNOSIS — I5032 Chronic diastolic (congestive) heart failure: Secondary | ICD-10-CM | POA: Diagnosis not present

## 2019-04-02 DIAGNOSIS — G4700 Insomnia, unspecified: Secondary | ICD-10-CM | POA: Diagnosis not present

## 2019-04-02 DIAGNOSIS — R7301 Impaired fasting glucose: Secondary | ICD-10-CM | POA: Diagnosis not present

## 2019-04-02 DIAGNOSIS — J449 Chronic obstructive pulmonary disease, unspecified: Secondary | ICD-10-CM | POA: Diagnosis not present

## 2019-04-02 DIAGNOSIS — I2510 Atherosclerotic heart disease of native coronary artery without angina pectoris: Secondary | ICD-10-CM | POA: Diagnosis not present

## 2019-04-02 DIAGNOSIS — I6529 Occlusion and stenosis of unspecified carotid artery: Secondary | ICD-10-CM | POA: Diagnosis not present

## 2019-04-02 DIAGNOSIS — E7800 Pure hypercholesterolemia, unspecified: Secondary | ICD-10-CM | POA: Diagnosis not present

## 2019-04-08 DIAGNOSIS — C44519 Basal cell carcinoma of skin of other part of trunk: Secondary | ICD-10-CM | POA: Diagnosis not present

## 2019-04-08 DIAGNOSIS — L570 Actinic keratosis: Secondary | ICD-10-CM | POA: Diagnosis not present

## 2019-04-08 DIAGNOSIS — C44329 Squamous cell carcinoma of skin of other parts of face: Secondary | ICD-10-CM | POA: Diagnosis not present

## 2019-04-08 DIAGNOSIS — C44311 Basal cell carcinoma of skin of nose: Secondary | ICD-10-CM | POA: Diagnosis not present

## 2019-04-18 DIAGNOSIS — C44311 Basal cell carcinoma of skin of nose: Secondary | ICD-10-CM | POA: Diagnosis not present

## 2019-04-18 DIAGNOSIS — C44329 Squamous cell carcinoma of skin of other parts of face: Secondary | ICD-10-CM | POA: Diagnosis not present

## 2019-05-07 ENCOUNTER — Encounter: Payer: Medicare Other | Admitting: *Deleted

## 2019-05-08 ENCOUNTER — Telehealth: Payer: Self-pay

## 2019-05-08 NOTE — Telephone Encounter (Signed)
Left message for patient to remind of missed remote transmission.  

## 2019-05-14 ENCOUNTER — Telehealth: Payer: Self-pay | Admitting: Cardiovascular Disease

## 2019-05-14 NOTE — Telephone Encounter (Signed)
I called pt to confirm her appt for7-16-20 with Dr C.       1. Confirm consent - "In the setting of the current Covid19 crisis, you are scheduled for a (phone or video) visit with your provider on (date) at (time).  Just as we do with many in-office visits, in order for you to participate in this visit, we must obtain consent.  If you'd like, I can send this to your mychart (if signed up) or email for you to review.  Otherwise, I can obtain your verbal consent now.  All virtual visits are billed to your insurance company just like a normal visit would be.  By agreeing to a virtual visit, we'd like you to understand that the technology does not allow for your provider to perform an examination, and thus may limit your provider's ability to fully assess your condition. If your provider identifies any concerns that need to be evaluated in person, we will make arrangements to do so.  Finally, though the technology is pretty good, we cannot assure that it will always work on either your or our end, and in the setting of a video visit, we may have to convert it to a phone-only visit.  In either situation, we cannot ensure that we have a secure connection.  Are you willing to proceed?" STAFF: Did the patient verbally acknowledge consent to telehealth visit? Document YES/NO here: Yes .     FULL LENGTH CONSENT FOR TELE-HEALTH VISIT   I hereby voluntarily request, consent and authorize [facility] and its employed or contracted physicians, physician assistants, nurse practitioners or other licensed health care professionals (the Practitioner), to provide me with telemedicine health care services (the Services") as deemed necessary by the treating Practitioner. I acknowledge and consent to receive the Services by the Practitioner via telemedicine. I understand that the telemedicine visit will involve communicating with the Practitioner through live audiovisual communication technology and the disclosure of  certain medical information by electronic transmission. I acknowledge that I have been given the opportunity to request an in-person assessment or other available alternative prior to the telemedicine visit and am voluntarily participating in the telemedicine visit.  I understand that I have the right to withhold or withdraw my consent to the use of telemedicine in the course of my care at any time, without affecting my right to future care or treatment, and that the Practitioner or I may terminate the telemedicine visit at any time. I understand that I have the right to inspect all information obtained and/or recorded in the course of the telemedicine visit and may receive copies of available information for a reasonable fee.  I understand that some of the potential risks of receiving the Services via telemedicine include:   Delay or interruption in medical evaluation due to technological equipment failure or disruption;  Information transmitted may not be sufficient (e.g. poor resolution of images) to allow for appropriate medical decision making by the Practitioner; and/or   In rare instances, security protocols could fail, causing a breach of personal health information.  Furthermore, I acknowledge that it is my responsibility to provide information about my medical history, conditions and care that is complete and accurate to the best of my ability. I acknowledge that Practitioner's advice, recommendations, and/or decision may be based on factors not within their control, such as incomplete or inaccurate data provided by me or distortions of diagnostic images or specimens that may result from electronic transmissions. I understand that the practice of  medicine is not an Chief Strategy Officer and that Practitioner makes no warranties or guarantees regarding treatment outcomes. I acknowledge that I will receive a copy of this consent concurrently upon execution via email to the email address I last provided but  may also request a printed copy by calling the office of [facility].    I understand that my insurance will be billed for this visit.   I have read or had this consent read to me.  I understand the contents of this consent, which adequately explains the benefits and risks of the Services being provided via telemedicine.   I have been provided ample opportunity to ask questions regarding this consent and the Services and have had my questions answered to my satisfaction.  I give my informed consent for the services to be provided through the use of telemedicine in my medical care  By participating in this telemedicine visit I agree to the above.

## 2019-05-16 ENCOUNTER — Ambulatory Visit (INDEPENDENT_AMBULATORY_CARE_PROVIDER_SITE_OTHER): Payer: Medicare Other | Admitting: *Deleted

## 2019-05-16 ENCOUNTER — Telehealth: Payer: Self-pay | Admitting: Pharmacist

## 2019-05-16 ENCOUNTER — Telehealth (INDEPENDENT_AMBULATORY_CARE_PROVIDER_SITE_OTHER): Payer: Medicare Other | Admitting: Cardiovascular Disease

## 2019-05-16 ENCOUNTER — Telehealth: Payer: Self-pay | Admitting: *Deleted

## 2019-05-16 VITALS — BP 147/75 | HR 80 | Ht 65.0 in | Wt 170.0 lb

## 2019-05-16 DIAGNOSIS — I495 Sick sinus syndrome: Secondary | ICD-10-CM

## 2019-05-16 DIAGNOSIS — Z950 Presence of cardiac pacemaker: Secondary | ICD-10-CM

## 2019-05-16 DIAGNOSIS — E7800 Pure hypercholesterolemia, unspecified: Secondary | ICD-10-CM | POA: Diagnosis not present

## 2019-05-16 DIAGNOSIS — N183 Chronic kidney disease, stage 3 unspecified: Secondary | ICD-10-CM

## 2019-05-16 DIAGNOSIS — I480 Paroxysmal atrial fibrillation: Secondary | ICD-10-CM | POA: Diagnosis not present

## 2019-05-16 DIAGNOSIS — I10 Essential (primary) hypertension: Secondary | ICD-10-CM | POA: Diagnosis not present

## 2019-05-16 DIAGNOSIS — I2510 Atherosclerotic heart disease of native coronary artery without angina pectoris: Secondary | ICD-10-CM | POA: Diagnosis not present

## 2019-05-16 DIAGNOSIS — I4892 Unspecified atrial flutter: Secondary | ICD-10-CM

## 2019-05-16 DIAGNOSIS — I6522 Occlusion and stenosis of left carotid artery: Secondary | ICD-10-CM

## 2019-05-16 DIAGNOSIS — I4819 Other persistent atrial fibrillation: Secondary | ICD-10-CM

## 2019-05-16 DIAGNOSIS — I5032 Chronic diastolic (congestive) heart failure: Secondary | ICD-10-CM

## 2019-05-16 NOTE — Telephone Encounter (Signed)
Samples of Repatha SureClick140mg  drug were given to the patient, quantity #1, Lot Number [id number], exp 10/22.  Samples of Eliquis 2.5mg  were given to the patient, quantity 28 tabs, Lot Number [id number], EXP SEP/2021

## 2019-05-16 NOTE — Progress Notes (Signed)
Virtual Visit via Telephone Note   This visit type was conducted due to national recommendations for restrictions regarding the COVID-19 Pandemic (e.g. social distancing) in an effort to limit this patient's exposure and mitigate transmission in our community.  Due to her co-morbid illnesses, this patient is at least at moderate risk for complications without adequate follow up.  This format is felt to be most appropriate for this patient at this time.  The patient did not have access to video technology/had technical difficulties with video requiring transitioning to audio format only (telephone).  All issues noted in this document were discussed and addressed.  No physical exam could be performed with this format.  Please refer to the patient's chart for her  consent to telehealth for [facility].   Date:  05/16/2019   ID:  Amy [name], DOB [birth year], MRN [id number]  Patient Location: Home Provider Location: Home  PCP:  Celene Squibb, MD  Cardiologist:  Sanda Klein, MD   Electrophysiologist:  None   Evaluation Performed:  Follow-Up Visit  Chief Complaint:  CAD, AFib, PM, HLP  History of Present Illness:    Amy [name] is a 85 y.o. female with history of coronary artery disease and previous bypass surgery as well as sinus node dysfunction and persistent atrial arrhythmia, history of left inferior hemianopsia due to retinal artery occlusion.  She underwent a pacemaker generator change out in 2019 (Medtronic azure XT).  She has stage III chronic kidney disease and moderate carotid stenosis.  She has severe mixed hyperlipidemia intolerant of statins, on Repatha.  She is generally doing well although she has had some problems with fatigue and wonders whether she has recurrent atrial fibrillation.  Interrogation of her pacemaker today shows that she has been in normal rhythm ever since her cardioversion for symptomatic persistent atrial fibrillation in November 2019.    She denies  angina, dyspnea either at rest or with activity.  She has no problems walking but if she stands for a long period of time she has low back pain and has to sit down.  She has had occasional pain in her left foot and sometimes severe pain radiating up and down her entire leg to her hip.  It did get better with acetaminophen and heating pads.  She has occasional mild edema and only takes half of a furosemide tablet.  She understands that excessive diuresis can worsen kidney function and cause more episodes of gout.  She has not had orthopnea or PND.  She denies dizziness or syncope and has occasional very brief palpitations.  She had labs with Dr. Nevada Crane on April 27 and her creatinine was 1.57, which is pretty much her baseline.  Also had her lipid profile checked that it is reasonably good total cholesterol 192, HDL 60, triglycerides 175 (that would make a calculated LDL of roughly 100, which is not bad considering that her baseline LDL cholesterol when untreated is around 260).  Comprehensive remote interrogation of her pacemaker today shows normal device function.  Battery longevity is almost 13 years.  She has roughly 45% atrial pacing and does not require ventricular pacing.  Her heart rate histogram is excellent.  In the last 100 days she is only had 25 seconds of atrial arrhythmia (no electrograms available).  There has been no atrial fibrillation.  Lead parameters are excellent.  The patient does not have symptoms concerning for COVID-19 infection (fever, chills, cough, or new shortness of breath). In 2009 she had an  acute inferior wall ST segment elevation myocardial infarction treated with urgent angioplasty and placement of a bare-metal stent. Subsequently, she underwent multivessel bypass surgery with LIMA to LAD, SVG to ramus SVG to RCA. She has no evidence of residual myocardial injury and has preserved left ventricular systolic function. In June 2015, cardiac catheterization showed 90% proximal and  mid LAD with patent distal LIMA bypass; 40% ostial circumflex, not bypassed; unchanged 80% proximal ramus intermedius, SVG to ramus occluded; patent stent right coronary artery, SVG to right coronary artery occluded. Her LVEF was 55%, there was no significant mitral regurgitation and the LVEDP was high normal at 15 mm Hg, (when she was short of breath). [name] stated that her dyspnea is likely multifactorial and cannot be explained completely by cardiac findings. In May 2016 she had a low risk nuclear study.  In 2010 a permanent pacemaker (Medtronic) was implanted for symptomatic bradycardia.  She underwent a generator change out in March 2019.  In late 2013 she had a fall complicated by a hip fracture and radius fracture. She had an esophageal perforation complicated by mediastinitis and had a feeding tube for several months. In October 2015 she had another fall in her home (no syncope, tripped at [facility].carpet transition) and fractured her pelvis and humerus.   She has fairly severe hyperlipidemia with an LDL cholesterol in excess of 200. She is statin, niacin, ezetimibe and resin intolerant. She has discussed PSCK9 inhibitors in the past but has decided not to take these.  She has systolic HTN, but also a tendency to marked orthostatic hypotension (25 mm Hg drop in SBP).  She has moderate CKD, baseline GFR around 30 mL/minute, has seen Dr. Lowanda Foster in the past, but does not have regular nephrology follow-up.  July 2019 she had an episode of unilateral inferior hemianopsia.  Brief episodes of atrial fibrillation were seen around that time.  There was also a moderate left internal carotid artery stenosis.  She had persistent atrial fibrillation from late July until November 2019.     Past Medical History:  Diagnosis Date  . Anxiety   . Arthritis   . Asthma   . CAD in native artery    s/p CABG x 3; Last Myoview in 07/2009 - non-ischemic; Echo 03/2012  Aortic Sclerosis with normal EF.   . CKD (chronic kidney disease), stage III ([place])   . DVT (deep vein thrombosis) in pregnancy   . Esophageal perforation    9/13  . Fall 07/21/2014   FALL WITH INJURY  . Fracture of head of humerus   . Fracture of hip ([place]) 07/21/2014   LEFT  . GERD (gastroesophageal reflux disease)   . Hypercholesteremia   . Hypertension   . Hypothyroidism   . Loss of vision 05/22/2018   LEFT EYE  . Mediastinitis    s/p drainage  . Myocardial infarction [facility])    Inferior STEMI 07/2008 - PCI of prox RCA followed byu CABG x 3 in 9/'09  . Pacemaker   . PAF (paroxysmal atrial fibrillation) ([place])   . Paroxysmal atrial flutter ([place]) 11/26/2014  . Pneumonia   . Shortness of breath   . SSS (sick sinus syndrome) ([place]) 11/26/2014  . Status post hip hemiarthroplasty left hip by Dr Ronnie Derby 08/08/2012   Past Surgical History:  Procedure Laterality Date  . CARDIOVERSION N/A 09/21/2018   Procedure: CARDIOVERSION;  Surgeon: Sanda Klein, MD;  Location: [place];  Service: Cardiovascular;  Laterality: N/A;  . CARPAL TUNNEL RELEASE  08/08/2012  Procedure: CARPAL TUNNEL RELEASE;  Surgeon: Schuyler Amor, MD;  Location: [place];  Service: Orthopedics;  Laterality: Left;  . CORONARY ARTERY BYPASS GRAFT  2009   LIMA-LAD, SVG-RI, SVG-stented RCA  . ESOPHAGOGASTRODUODENOSCOPY  08/10/2012   Procedure: ESOPHAGOGASTRODUODENOSCOPY (EGD);  Surgeon: Beryle Beams, MD;  Location: [facility] ENDOSCOPY;  Service: Endoscopy;  Laterality: N/A;  . ESOPHAGOSCOPY  08/10/2012   Procedure: ESOPHAGOSCOPY;  Surgeon: [name], MD;  Location: [place];  Service: ENT;  Laterality: N/A;  . GASTROSTOMY  08/16/2012   Procedure: GASTROSTOMY;  Surgeon: Zenovia Jarred, MD;  Location: [place];  Service: General;  Laterality: N/A;  . HIP ARTHROPLASTY  08/08/2012   Procedure: ARTHROPLASTY BIPOLAR HIP;  Surgeon: Rudean Haskell, MD;  Location: [place];  Service: Orthopedics;  Laterality: Left;  [place]   . JOINT REPLACEMENT    . LEFT HEART  CATHETERIZATION WITH CORONARY ANGIOGRAM N/A 04/16/2014   Procedure: LEFT HEART CATHETERIZATION WITH CORONARY ANGIOGRAM;  Surgeon: Wellington Hampshire, MD;  Location: [place] CATH LAB;  Service: Cardiovascular;  Laterality: N/A;  . NM MYOCAR PERF WALL MOTION  08/14/2009   Normal  . PACEMAKER INSERTION  01/06/2009   Medtronic  . PPM GENERATOR CHANGEOUT N/A 01/24/2018   Procedure: PPM GENERATOR CHANGEOUT;  Surgeon: Sanda Klein, MD;  Location: [place] CV LAB;  Service: Cardiovascular;  Laterality: N/A;  . TOTAL HIP ARTHROPLASTY    . WRIST FRACTURE SURGERY  07/2012   left intra articular  with carpel tunnel     Current Meds  Medication Sig  . acetaminophen (TYLENOL) 500 MG tablet Take 1,000 mg by mouth every 6 (six) hours as needed for moderate pain.  Marland Kitchen albuterol (PROVENTIL HFA;VENTOLIN HFA) 108 (90 Base) MCG/ACT inhaler Inhale 2 puffs into the lungs every 6 (six) hours as needed for wheezing or shortness of breath.   . allopurinol (ZYLOPRIM) 100 MG tablet Take 100 mg by mouth daily.  Marland Kitchen ALPRAZolam (XANAX) 0.25 MG tablet Take 0.25 mg by mouth at bedtime.  . calcium carbonate (TUMS - DOSED IN MG ELEMENTAL CALCIUM) 500 MG chewable tablet Chew 2 tablets by mouth daily as needed for indigestion or heartburn.  . Cholecalciferol (VITAMIN D3 PO) Take 1 tablet by mouth daily.  . cycloSPORINE (RESTASIS) 0.05 % ophthalmic emulsion Place 1 drop into both eyes 2 (two) times daily.  Marland Kitchen docusate sodium (COLACE) 100 MG capsule Take 100 mg by mouth 2 (two) times daily.  Marland Kitchen ELIQUIS 2.5 MG TABS tablet Take 1 tablet by mouth twice daily  . Fluticasone-Umeclidin-Vilant (TRELEGY ELLIPTA) 100-62.5-25 MCG/INH AEPB Inhale 1 puff into the lungs daily.   . furosemide (LASIX) 40 MG tablet TAKE 1 TABLET BY MOUTH ONCE DAILY AS DIRECTED (Patient taking differently: Take 20 mg by mouth daily. )  . levothyroxine (SYNTHROID, LEVOTHROID) 50 MCG tablet Take 50 mcg by mouth daily before breakfast.   . loratadine (CLARITIN) 10 MG tablet  Take 10 mg by mouth daily as needed for allergies.  . metoprolol succinate (TOPROL-XL) 25 MG 24 hr tablet Take 1 tablet by mouth once daily  . Misc Natural Products (TART CHERRY ADVANCED PO) Take 1 tablet by mouth daily.  . nitroGLYCERIN (NITROSTAT) 0.4 MG SL tablet Place 1 tablet (0.4 mg total) under the tongue every 5 (five) minutes x 3 doses as needed for chest pain.  Marland Kitchen ondansetron (ZOFRAN) 4 MG tablet Take 1 tablet by mouth every 8 (eight) hours as needed for nausea or vomiting.   . potassium chloride (K-DUR) 10 MEQ  tablet Take 1 tablet by mouth once daily  . Probiotic CAPS Take 1 capsule by mouth daily.  Marland Kitchen REPATHA SURECLICK 384 MG/ML SOAJ Inject 140 mg as directed every 14 (fourteen) days.   . Wheat Dextrin (BENEFIBER DRINK MIX PO) Take 1 Dose by mouth daily.   . [DISCONTINUED] LORazepam (ATIVAN) 1 MG tablet Take 0.5 mg by mouth at bedtime as needed for anxiety or sleep.   . [DISCONTINUED] omeprazole (PRILOSEC) 40 MG capsule TAKE 1 CAPSULE BY MOUTH ONCE DAILY  . [DISCONTINUED] potassium chloride (K-DUR) 10 MEQ tablet Take 10 mEq by mouth daily.     Allergies:   Colestipol, Hydrocodone, Niacin and related, Statins, and Zetia [ezetimibe]   Social History   Tobacco Use  . Smoking status: Never Smoker  . Smokeless tobacco: Never Used  Substance Use Topics  . Alcohol use: No  . Drug use: No     Family Hx: The patient's family history includes Stroke in her father.  ROS:   Please see the history of present illness.     All other systems reviewed and are negative.   Prior CV studies:   The following studies were reviewed today: Comprehensive remote pacemaker check today  Labs/Other Tests and Data Reviewed:    EKG:  Intracardiac electrogram today shows atrial paced, ventricular sensed rhythm  Recent Labs: 08/13/2018: ALT 21 09/14/2018: BUN 18; Creatinine, Ser 1.71; Hemoglobin 13.0; Platelets 253; Potassium 4.6; Sodium 137   Recent Lipid Panel Lab Results  Component Value  Date/Time   CHOL 193 08/13/2018 09:55 AM   TRIG 153 (H) 08/13/2018 09:55 AM   HDL 51 08/13/2018 09:55 AM   CHOLHDL 3.8 08/13/2018 09:55 AM   CHOLHDL 6.0 05/23/2018 08:56 AM   LDLCALC 111 (H) 08/13/2018 09:55 AM   Labs from 02/25/2019 show total cholesterol 192, HDL 60, LDL low not reported, triglycerides 175, hemoglobin A1c 5.9%, hemoglobin 13, creatinine 1.57, ALT 11, TSH 3.00  Wt Readings from Last 3 Encounters:  05/16/19 170 lb (77.1 kg)  09/21/18 170 lb (77.1 kg)  09/20/18 170 lb (77.1 kg)     Objective:    Vital Signs:  BP (!) 147/75   Pulse 80   Ht 5\' 5"  (1.651 m)   Wt 170 lb (77.1 kg)   BMI 28.29 kg/m    VITAL SIGNS:  reviewed Unable to examine  ASSESSMENT & PLAN:    1. AFib: No recurrence since cardioversion November 2019.  She has had both atrial fibrillation and atrial flutter by pacemaker checks.  On appropriate anticoagulation, CHADSVasc 8 (age 70, CVA 2, gender, HF, PAD/CAD, HTN).  2. Eliquis: dose adjusted for age and kidney function; occasional hemorrhoidal bleeding, otherwise no bleeding complications. 3. CHF: It sounds like she is euvolemic maintaining very good balance between heart failure and kidney dysfunction.  Her weight is identical to last November.  4. CAD s/p CABG: Asymptomatic.  5. SSS: Good heart rate histogram distribution with current device rate response settings.  6. PM: She successfully hooked up a new transmitter.  We will continue with remote downloads every 3 months.   7. HTN: Although she had slight systolic hypertension today, her typical blood pressure in the 110 was 120s.  She has had some problems with orthostatic hypotension in the past.  No changes recommended to her medicines.  8. HLP: Most recent labs from Dr. Nevada Crane show cholesterol 192, HDL 60, triglycerides 175 (by my calculation LDL about 97).  Although this is not at the "target" of 70 or  less, is remarkably good considering her baseline LDL cholesterol was 257.  No changes to her  medicines.  The biggest issue is the cost of the medication.  9. Left internal carotid artery stenosis: Plan medical management. 10. Foot/leg pain: Not very easy to make a diagnosis via telephone call but my suspicion is is reflective of lumbar spine disease rather than a local problem.  Does not sound vascular in etiology.  Symptoms occur while lying in bed, not while walking.  She has low back pain if she stands for prolonged periods of time.  For now symptoms are mild and management with heating pads and analgesics is appropriate.  COVID-19 Education: The signs and symptoms of COVID-19 were discussed with the patient and how to seek care for testing (follow up with PCP or arrange E-visit).  The importance of social distancing was discussed today.  Time:   Today, I have spent 26 minutes with the patient with telehealth technology discussing the above problems.     Medication Adjustments/Labs and Tests Ordered: Current medicines are reviewed at length with the patient today.  Concerns regarding medicines are outlined above.   Tests Ordered: No orders of the defined types were placed in this encounter.   Medication Changes: No orders of the defined types were placed in this encounter.   Follow Up:  Virtual Visit or In Person 1 year  Signed, Sanda Klein, MD  05/16/2019 8:40 AM    [place]

## 2019-05-16 NOTE — Patient Instructions (Signed)
Medication Instructions:  Your physician recommends that you continue on your current medications as directed. Please refer to the Current Medication list given to you today.  If you need a refill on your cardiac medications before your next appointment, please call your pharmacy.   Lab work: None ordered If you have labs (blood work) drawn today and your tests are completely normal, you will receive your results only by: [place] (if you have MyChart) OR A paper copy in the mail If you have any lab test that is abnormal or we need to change your treatment, we will call you to review the results.  Testing/Procedures: None ordered  Follow-Up: At [facility], you and your health needs are our priority.  As part of our continuing mission to provide you with exceptional heart care, we have created designated Provider Care Teams.  These Care Teams include your primary Cardiologist (physician) and Advanced Practice Providers (APPs -  Physician Assistants and Nurse Practitioners) who all work together to provide you with the care you need, when you need it. You will need a follow up appointment in 12 months.  Please call our office 2 months in advance to schedule this appointment.  You may see Sanda Klein, MD or one of the following Advanced Practice Providers on your designated Care Team: Almyra Deforest, PA-C Fabian Sharp, [place]

## 2019-05-16 NOTE — Telephone Encounter (Signed)
Call placed to the daughter, per dpr. During the virtual visit, the daughter stated that the Repatha and Eliquis assistance had been denied. Samples will be provided today.  She stated that Dr.Hall in [place] has been taking care of the [place] and is currently appealing the denial.

## 2019-05-17 LAB — CUP PACEART REMOTE DEVICE CHECK
Battery Remaining Longevity: 153 mo
Battery Remaining Longevity: 153 mo
Battery Voltage: 3.05 V
Battery Voltage: 3.05 V
Brady Statistic AP VP Percent: 0.02 %
Brady Statistic AP VP Percent: 0.01 %
Brady Statistic AP VS Percent: 44.73 %
Brady Statistic AP VS Percent: 46.17 %
Brady Statistic AS VP Percent: 0.06 %
Brady Statistic AS VP Percent: 0 %
Brady Statistic AS VS Percent: 55.19 %
Brady Statistic AS VS Percent: 53.82 %
Brady Statistic RA Percent Paced: 44.91 %
Brady Statistic RA Percent Paced: 46.47 %
Brady Statistic RV Percent Paced: 0.08 %
Brady Statistic RV Percent Paced: 0.01 %
Date Time Interrogation Session: 20200716124943
Date Time Interrogation Session: 20200717022922
Implantable Lead Implant Date: 20100309
Implantable Lead Implant Date: 20100309
Implantable Lead Implant Date: 20100309
Implantable Lead Implant Date: 20100309
Implantable Lead Location: 753859
Implantable Lead Location: 753860
Implantable Lead Location: 753859
Implantable Lead Location: 753860
Implantable Lead Model: 5076
Implantable Lead Model: 5076
Implantable Lead Model: 5076
Implantable Lead Model: 5076
Implantable Pulse Generator Implant Date: 20190327
Implantable Pulse Generator Implant Date: 20190327
Lead Channel Impedance Value: 323 Ohm
Lead Channel Impedance Value: 399 Ohm
Lead Channel Impedance Value: 646 Ohm
Lead Channel Impedance Value: 703 Ohm
Lead Channel Impedance Value: 323 Ohm
Lead Channel Impedance Value: 399 Ohm
Lead Channel Impedance Value: 646 Ohm
Lead Channel Impedance Value: 703 Ohm
Lead Channel Pacing Threshold Amplitude: 0.5 V
Lead Channel Pacing Threshold Amplitude: 0.5 V
Lead Channel Pacing Threshold Amplitude: 0.5 V
Lead Channel Pacing Threshold Amplitude: 0.5 V
Lead Channel Pacing Threshold Pulse Width: 0.4 ms
Lead Channel Pacing Threshold Pulse Width: 0.4 ms
Lead Channel Pacing Threshold Pulse Width: 0.4 ms
Lead Channel Pacing Threshold Pulse Width: 0.4 ms
Lead Channel Sensing Intrinsic Amplitude: 15.125 mV
Lead Channel Sensing Intrinsic Amplitude: 15.125 mV
Lead Channel Sensing Intrinsic Amplitude: 2.625 mV
Lead Channel Sensing Intrinsic Amplitude: 2.625 mV
Lead Channel Sensing Intrinsic Amplitude: 15.125 mV
Lead Channel Sensing Intrinsic Amplitude: 15.125 mV
Lead Channel Sensing Intrinsic Amplitude: 2.625 mV
Lead Channel Sensing Intrinsic Amplitude: 2.625 mV
Lead Channel Setting Pacing Amplitude: 2 V
Lead Channel Setting Pacing Amplitude: 2.5 V
Lead Channel Setting Pacing Amplitude: 2 V
Lead Channel Setting Pacing Amplitude: 2.5 V
Lead Channel Setting Pacing Pulse Width: 0.4 ms
Lead Channel Setting Pacing Pulse Width: 0.4 ms
Lead Channel Setting Sensing Sensitivity: 1.2 mV
Lead Channel Setting Sensing Sensitivity: 1.2 mV

## 2019-05-24 ENCOUNTER — Encounter: Payer: Self-pay | Admitting: Cardiology

## 2019-05-24 NOTE — Progress Notes (Signed)
Remote pacemaker transmission.   

## 2019-05-28 ENCOUNTER — Other Ambulatory Visit (INDEPENDENT_AMBULATORY_CARE_PROVIDER_SITE_OTHER): Payer: Self-pay | Admitting: Internal Medicine

## 2019-05-28 DIAGNOSIS — K219 Gastro-esophageal reflux disease without esophagitis: Secondary | ICD-10-CM

## 2019-05-28 MED ORDER — OMEPRAZOLE 20 MG PO CPDR: 20.0000 mg | DELAYED_RELEASE_CAPSULE | Freq: Two times a day (BID) | ORAL | 5 refills | Status: DC

## 2019-05-28 NOTE — Progress Notes (Signed)
Rx for Prilosec 20mg  BID sent to her pharmacy

## 2019-07-25 DIAGNOSIS — N184 Chronic kidney disease, stage 4 (severe): Secondary | ICD-10-CM | POA: Diagnosis not present

## 2019-07-25 DIAGNOSIS — I6529 Occlusion and stenosis of unspecified carotid artery: Secondary | ICD-10-CM | POA: Diagnosis not present

## 2019-07-25 DIAGNOSIS — R7301 Impaired fasting glucose: Secondary | ICD-10-CM | POA: Diagnosis not present

## 2019-07-25 DIAGNOSIS — J449 Chronic obstructive pulmonary disease, unspecified: Secondary | ICD-10-CM | POA: Diagnosis not present

## 2019-07-25 DIAGNOSIS — I2510 Atherosclerotic heart disease of native coronary artery without angina pectoris: Secondary | ICD-10-CM | POA: Diagnosis not present

## 2019-07-25 DIAGNOSIS — G4700 Insomnia, unspecified: Secondary | ICD-10-CM | POA: Diagnosis not present

## 2019-07-25 DIAGNOSIS — I480 Paroxysmal atrial fibrillation: Secondary | ICD-10-CM | POA: Diagnosis not present

## 2019-07-25 DIAGNOSIS — E7800 Pure hypercholesterolemia, unspecified: Secondary | ICD-10-CM | POA: Diagnosis not present

## 2019-07-25 DIAGNOSIS — I5032 Chronic diastolic (congestive) heart failure: Secondary | ICD-10-CM | POA: Diagnosis not present

## 2019-07-25 DIAGNOSIS — E790 Hyperuricemia without signs of inflammatory arthritis and tophaceous disease: Secondary | ICD-10-CM | POA: Diagnosis not present

## 2019-07-25 DIAGNOSIS — I10 Essential (primary) hypertension: Secondary | ICD-10-CM | POA: Diagnosis not present

## 2019-07-27 NOTE — Progress Notes (Deleted)
Subjective:    Patient ID: Amy [name], female    DOB: 07/04/1922, 85 y.o.   MRN: [id number]  HPI Amy [name] is a 85 year old female with a past medical history significant for anxiety, asthma, hypertension, atrial fibrillation, pacemaker, CAD s/p 3 vessel CABG 2010, CVA, blind left eye, CKD stage III, DVT,  esophageal perforation and GERD. She takes Prilosec 20mg  po bid. Past Medical History:  Diagnosis Date  . Anxiety   . Arthritis   . Asthma   . CAD in native artery    s/p CABG x 3; Last Myoview in 07/2009 - non-ischemic; Echo 03/2012  Aortic Sclerosis with normal EF.  . CKD (chronic kidney disease), stage III ([place])   . DVT (deep vein thrombosis) in pregnancy   . Esophageal perforation    9/13  . Fall 07/21/2014   FALL WITH INJURY  . Fracture of head of humerus   . Fracture of hip ([place]) 07/21/2014   LEFT  . GERD (gastroesophageal reflux disease)   . Hypercholesteremia   . Hypertension   . Hypothyroidism   . Loss of vision 05/22/2018   LEFT EYE  . Mediastinitis    s/p drainage  . Myocardial infarction [facility])    Inferior STEMI 07/2008 - PCI of prox RCA followed byu CABG x 3 in 9/'09  . Pacemaker   . PAF (paroxysmal atrial fibrillation) ([place])   . Paroxysmal atrial flutter ([place]) 11/26/2014  . Pneumonia   . Shortness of breath   . SSS (sick sinus syndrome) ([place]) 11/26/2014  . Status post hip hemiarthroplasty left hip by Dr Ronnie Derby 08/08/2012   Past Surgical History:  Procedure Laterality Date  . CARDIOVERSION N/A 09/21/2018   Procedure: CARDIOVERSION;  Surgeon: Sanda Klein, MD;  Location: [place] ENDOSCOPY;  Service: Cardiovascular;  Laterality: N/A;  . CARPAL TUNNEL RELEASE  08/08/2012   Procedure: CARPAL TUNNEL RELEASE;  Surgeon: Schuyler Amor, MD;  Location: [place];  Service: Orthopedics;  Laterality: Left;  . CORONARY ARTERY BYPASS GRAFT  2009   LIMA-LAD, SVG-RI, SVG-stented RCA  . ESOPHAGOGASTRODUODENOSCOPY  08/10/2012   Procedure: ESOPHAGOGASTRODUODENOSCOPY (EGD);   Surgeon: Beryle Beams, MD;  Location: [facility] ENDOSCOPY;  Service: Endoscopy;  Laterality: N/A;  . ESOPHAGOSCOPY  08/10/2012   Procedure: ESOPHAGOSCOPY;  Surgeon: [name], MD;  Location: [place];  Service: ENT;  Laterality: N/A;  . GASTROSTOMY  08/16/2012   Procedure: GASTROSTOMY;  Surgeon: Zenovia Jarred, MD;  Location: [place];  Service: General;  Laterality: N/A;  . HIP ARTHROPLASTY  08/08/2012   Procedure: ARTHROPLASTY BIPOLAR HIP;  Surgeon: Rudean Haskell, MD;  Location: [place];  Service: Orthopedics;  Laterality: Left;  [place]   . JOINT REPLACEMENT    . LEFT HEART CATHETERIZATION WITH CORONARY ANGIOGRAM N/A 04/16/2014   Procedure: LEFT HEART CATHETERIZATION WITH CORONARY ANGIOGRAM;  Surgeon: Wellington Hampshire, MD;  Location: [place] CATH LAB;  Service: Cardiovascular;  Laterality: N/A;  . NM MYOCAR PERF WALL MOTION  08/14/2009   Normal  . PACEMAKER INSERTION  01/06/2009   Medtronic  . PPM GENERATOR CHANGEOUT N/A 01/24/2018   Procedure: PPM GENERATOR CHANGEOUT;  Surgeon: Sanda Klein, MD;  Location: [place] CV LAB;  Service: Cardiovascular;  Laterality: N/A;  . TOTAL HIP ARTHROPLASTY    . WRIST FRACTURE SURGERY  07/2012   left intra articular  with carpel tunnel    Review of Systems     Objective:   Physical Exam  Assessment & Plan:   1. GERD

## 2019-07-29 ENCOUNTER — Telehealth (INDEPENDENT_AMBULATORY_CARE_PROVIDER_SITE_OTHER): Payer: Self-pay | Admitting: Nurse Practitioner

## 2019-07-29 ENCOUNTER — Ambulatory Visit (INDEPENDENT_AMBULATORY_CARE_PROVIDER_SITE_OTHER): Payer: Medicare Other | Admitting: Nurse Practitioner

## 2019-07-29 NOTE — Telephone Encounter (Signed)
Patient daughter called stated patient had to cancel her appointment today because of a death in the family - daughter Francie Massing) would like to talk you at [phone]

## 2019-07-29 NOTE — Telephone Encounter (Signed)
I called daughter and left a msg on her voicemail to call me back regarding her mother, appt canceled today.

## 2019-07-30 ENCOUNTER — Telehealth (INDEPENDENT_AMBULATORY_CARE_PROVIDER_SITE_OTHER): Payer: Self-pay | Admitting: Nurse Practitioner

## 2019-07-30 NOTE — Telephone Encounter (Signed)
I called her daughter, mother strains for 30 minutes on toilet, she is taking a stool softener twice daily.  I recommended that she use Benefiber 1 tablespoon in the morning and MiraLAX 1 capful mixed in clear liquid of her preference at bedtime.  If she is having hemorrhoidal irritation she can use Desitin apply small amount inside the anal area into the external anal area as well.  Recommended patient to reschedule an office appointment if she continues to have constipation and hemorrhoidal discomfort.

## 2019-07-30 NOTE — Telephone Encounter (Signed)
Daughter called would like for you to call her back - stated you all got disconnected yesterday  -  Ph# [phone]

## 2019-08-06 ENCOUNTER — Other Ambulatory Visit: Payer: Self-pay | Admitting: Cardiovascular Disease

## 2019-08-06 DIAGNOSIS — E7800 Pure hypercholesterolemia, unspecified: Secondary | ICD-10-CM | POA: Diagnosis not present

## 2019-08-06 DIAGNOSIS — I2510 Atherosclerotic heart disease of native coronary artery without angina pectoris: Secondary | ICD-10-CM | POA: Diagnosis not present

## 2019-08-06 DIAGNOSIS — I5032 Chronic diastolic (congestive) heart failure: Secondary | ICD-10-CM | POA: Diagnosis not present

## 2019-08-06 DIAGNOSIS — E790 Hyperuricemia without signs of inflammatory arthritis and tophaceous disease: Secondary | ICD-10-CM | POA: Diagnosis not present

## 2019-08-06 DIAGNOSIS — G4700 Insomnia, unspecified: Secondary | ICD-10-CM | POA: Diagnosis not present

## 2019-08-06 DIAGNOSIS — N184 Chronic kidney disease, stage 4 (severe): Secondary | ICD-10-CM | POA: Diagnosis not present

## 2019-08-06 DIAGNOSIS — R7301 Impaired fasting glucose: Secondary | ICD-10-CM | POA: Diagnosis not present

## 2019-08-06 DIAGNOSIS — J449 Chronic obstructive pulmonary disease, unspecified: Secondary | ICD-10-CM | POA: Diagnosis not present

## 2019-08-06 DIAGNOSIS — I6529 Occlusion and stenosis of unspecified carotid artery: Secondary | ICD-10-CM | POA: Diagnosis not present

## 2019-08-06 DIAGNOSIS — I480 Paroxysmal atrial fibrillation: Secondary | ICD-10-CM | POA: Diagnosis not present

## 2019-08-06 DIAGNOSIS — I10 Essential (primary) hypertension: Secondary | ICD-10-CM | POA: Diagnosis not present

## 2019-08-15 ENCOUNTER — Ambulatory Visit (INDEPENDENT_AMBULATORY_CARE_PROVIDER_SITE_OTHER): Payer: Medicare Other | Admitting: *Deleted

## 2019-08-15 DIAGNOSIS — I495 Sick sinus syndrome: Secondary | ICD-10-CM | POA: Diagnosis not present

## 2019-08-15 DIAGNOSIS — I4819 Other persistent atrial fibrillation: Secondary | ICD-10-CM

## 2019-08-15 LAB — CUP PACEART REMOTE DEVICE CHECK
Battery Remaining Longevity: 151 mo
Battery Voltage: 3.04 V
Brady Statistic AP VP Percent: 0.05 %
Brady Statistic AP VS Percent: 46.46 %
Brady Statistic AS VP Percent: 0.07 %
Brady Statistic AS VS Percent: 53.41 %
Brady Statistic RA Percent Paced: 46.6 %
Brady Statistic RV Percent Paced: 0.13 %
Date Time Interrogation Session: 20201015060228
Implantable Lead Implant Date: 20100309
Implantable Lead Implant Date: 20100309
Implantable Lead Location: 753859
Implantable Lead Location: 753860
Implantable Lead Model: 5076
Implantable Lead Model: 5076
Implantable Pulse Generator Implant Date: 20190327
Lead Channel Impedance Value: 323 Ohm
Lead Channel Impedance Value: 399 Ohm
Lead Channel Impedance Value: 722 Ohm
Lead Channel Impedance Value: 779 Ohm
Lead Channel Pacing Threshold Amplitude: 0.5 V
Lead Channel Pacing Threshold Amplitude: 0.625 V
Lead Channel Pacing Threshold Pulse Width: 0.4 ms
Lead Channel Pacing Threshold Pulse Width: 0.4 ms
Lead Channel Sensing Intrinsic Amplitude: 16.125 mV
Lead Channel Sensing Intrinsic Amplitude: 3.125 mV
Lead Channel Setting Pacing Amplitude: 2 V
Lead Channel Setting Pacing Amplitude: 2.5 V
Lead Channel Setting Pacing Pulse Width: 0.4 ms
Lead Channel Setting Sensing Sensitivity: 1.2 mV

## 2019-08-30 NOTE — Progress Notes (Signed)
Remote pacemaker transmission.   

## 2019-09-05 DIAGNOSIS — R944 Abnormal results of kidney function studies: Secondary | ICD-10-CM | POA: Diagnosis not present

## 2019-09-05 DIAGNOSIS — N184 Chronic kidney disease, stage 4 (severe): Secondary | ICD-10-CM | POA: Diagnosis not present

## 2019-09-05 DIAGNOSIS — E782 Mixed hyperlipidemia: Secondary | ICD-10-CM | POA: Diagnosis not present

## 2019-09-05 DIAGNOSIS — Z1321 Encounter for screening for nutritional disorder: Secondary | ICD-10-CM | POA: Diagnosis not present

## 2019-09-05 DIAGNOSIS — R7301 Impaired fasting glucose: Secondary | ICD-10-CM | POA: Diagnosis not present

## 2019-09-05 DIAGNOSIS — I10 Essential (primary) hypertension: Secondary | ICD-10-CM | POA: Diagnosis not present

## 2019-09-05 DIAGNOSIS — E039 Hypothyroidism, unspecified: Secondary | ICD-10-CM | POA: Diagnosis not present

## 2019-09-06 ENCOUNTER — Other Ambulatory Visit: Payer: Self-pay | Admitting: Cardiovascular Disease

## 2019-09-10 DIAGNOSIS — G4700 Insomnia, unspecified: Secondary | ICD-10-CM | POA: Diagnosis not present

## 2019-09-10 DIAGNOSIS — E7800 Pure hypercholesterolemia, unspecified: Secondary | ICD-10-CM | POA: Diagnosis not present

## 2019-09-10 DIAGNOSIS — I10 Essential (primary) hypertension: Secondary | ICD-10-CM | POA: Diagnosis not present

## 2019-09-10 DIAGNOSIS — I5032 Chronic diastolic (congestive) heart failure: Secondary | ICD-10-CM | POA: Diagnosis not present

## 2019-09-10 DIAGNOSIS — J449 Chronic obstructive pulmonary disease, unspecified: Secondary | ICD-10-CM | POA: Diagnosis not present

## 2019-09-10 DIAGNOSIS — N184 Chronic kidney disease, stage 4 (severe): Secondary | ICD-10-CM | POA: Diagnosis not present

## 2019-09-10 DIAGNOSIS — I6529 Occlusion and stenosis of unspecified carotid artery: Secondary | ICD-10-CM | POA: Diagnosis not present

## 2019-09-10 DIAGNOSIS — I2510 Atherosclerotic heart disease of native coronary artery without angina pectoris: Secondary | ICD-10-CM | POA: Diagnosis not present

## 2019-09-10 DIAGNOSIS — E790 Hyperuricemia without signs of inflammatory arthritis and tophaceous disease: Secondary | ICD-10-CM | POA: Diagnosis not present

## 2019-09-10 DIAGNOSIS — R7301 Impaired fasting glucose: Secondary | ICD-10-CM | POA: Diagnosis not present

## 2019-09-10 DIAGNOSIS — I480 Paroxysmal atrial fibrillation: Secondary | ICD-10-CM | POA: Diagnosis not present

## 2019-09-10 DIAGNOSIS — H5340 Unspecified visual field defects: Secondary | ICD-10-CM | POA: Diagnosis not present

## 2019-09-20 DIAGNOSIS — E7800 Pure hypercholesterolemia, unspecified: Secondary | ICD-10-CM | POA: Diagnosis not present

## 2019-09-20 DIAGNOSIS — R7301 Impaired fasting glucose: Secondary | ICD-10-CM | POA: Diagnosis not present

## 2019-09-20 DIAGNOSIS — E790 Hyperuricemia without signs of inflammatory arthritis and tophaceous disease: Secondary | ICD-10-CM | POA: Diagnosis not present

## 2019-09-20 DIAGNOSIS — J449 Chronic obstructive pulmonary disease, unspecified: Secondary | ICD-10-CM | POA: Diagnosis not present

## 2019-09-20 DIAGNOSIS — I2510 Atherosclerotic heart disease of native coronary artery without angina pectoris: Secondary | ICD-10-CM | POA: Diagnosis not present

## 2019-09-20 DIAGNOSIS — I5032 Chronic diastolic (congestive) heart failure: Secondary | ICD-10-CM | POA: Diagnosis not present

## 2019-09-20 DIAGNOSIS — H5340 Unspecified visual field defects: Secondary | ICD-10-CM | POA: Diagnosis not present

## 2019-09-20 DIAGNOSIS — G4700 Insomnia, unspecified: Secondary | ICD-10-CM | POA: Diagnosis not present

## 2019-09-20 DIAGNOSIS — I10 Essential (primary) hypertension: Secondary | ICD-10-CM | POA: Diagnosis not present

## 2019-09-20 DIAGNOSIS — I480 Paroxysmal atrial fibrillation: Secondary | ICD-10-CM | POA: Diagnosis not present

## 2019-09-20 DIAGNOSIS — N184 Chronic kidney disease, stage 4 (severe): Secondary | ICD-10-CM | POA: Diagnosis not present

## 2019-09-20 DIAGNOSIS — I6529 Occlusion and stenosis of unspecified carotid artery: Secondary | ICD-10-CM | POA: Diagnosis not present

## 2019-09-25 ENCOUNTER — Other Ambulatory Visit: Payer: Self-pay

## 2019-10-28 ENCOUNTER — Other Ambulatory Visit: Payer: Self-pay | Admitting: Cardiovascular Disease

## 2019-11-08 DIAGNOSIS — I2510 Atherosclerotic heart disease of native coronary artery without angina pectoris: Secondary | ICD-10-CM | POA: Diagnosis not present

## 2019-11-08 DIAGNOSIS — E790 Hyperuricemia without signs of inflammatory arthritis and tophaceous disease: Secondary | ICD-10-CM | POA: Diagnosis not present

## 2019-11-08 DIAGNOSIS — J449 Chronic obstructive pulmonary disease, unspecified: Secondary | ICD-10-CM | POA: Diagnosis not present

## 2019-11-08 DIAGNOSIS — I480 Paroxysmal atrial fibrillation: Secondary | ICD-10-CM | POA: Diagnosis not present

## 2019-11-08 DIAGNOSIS — E7800 Pure hypercholesterolemia, unspecified: Secondary | ICD-10-CM | POA: Diagnosis not present

## 2019-11-08 DIAGNOSIS — R7301 Impaired fasting glucose: Secondary | ICD-10-CM | POA: Diagnosis not present

## 2019-11-08 DIAGNOSIS — H5340 Unspecified visual field defects: Secondary | ICD-10-CM | POA: Diagnosis not present

## 2019-11-08 DIAGNOSIS — I10 Essential (primary) hypertension: Secondary | ICD-10-CM | POA: Diagnosis not present

## 2019-11-08 DIAGNOSIS — I6529 Occlusion and stenosis of unspecified carotid artery: Secondary | ICD-10-CM | POA: Diagnosis not present

## 2019-11-08 DIAGNOSIS — N184 Chronic kidney disease, stage 4 (severe): Secondary | ICD-10-CM | POA: Diagnosis not present

## 2019-11-08 DIAGNOSIS — I130 Hypertensive heart and chronic kidney disease with heart failure and stage 1 through stage 4 chronic kidney disease, or unspecified chronic kidney disease: Secondary | ICD-10-CM | POA: Diagnosis not present

## 2019-11-08 DIAGNOSIS — I5032 Chronic diastolic (congestive) heart failure: Secondary | ICD-10-CM | POA: Diagnosis not present

## 2019-11-14 ENCOUNTER — Ambulatory Visit (INDEPENDENT_AMBULATORY_CARE_PROVIDER_SITE_OTHER): Payer: Medicare Other | Admitting: *Deleted

## 2019-11-14 DIAGNOSIS — I495 Sick sinus syndrome: Secondary | ICD-10-CM | POA: Diagnosis not present

## 2019-11-14 DIAGNOSIS — L570 Actinic keratosis: Secondary | ICD-10-CM | POA: Diagnosis not present

## 2019-11-14 LAB — CUP PACEART REMOTE DEVICE CHECK
Battery Remaining Longevity: 148 mo
Battery Voltage: 3.03 V
Brady Statistic AP VP Percent: 0.04 %
Brady Statistic AP VS Percent: 49.37 %
Brady Statistic AS VP Percent: 0.06 %
Brady Statistic AS VS Percent: 50.53 %
Brady Statistic RA Percent Paced: 49.57 %
Brady Statistic RV Percent Paced: 0.1 %
Date Time Interrogation Session: 20210113193251
Implantable Lead Implant Date: 20100309
Implantable Lead Implant Date: 20100309
Implantable Lead Location: 753859
Implantable Lead Location: 753860
Implantable Lead Model: 5076
Implantable Lead Model: 5076
Implantable Pulse Generator Implant Date: 20190327
Lead Channel Impedance Value: 342 Ohm
Lead Channel Impedance Value: 418 Ohm
Lead Channel Impedance Value: 741 Ohm
Lead Channel Impedance Value: 817 Ohm
Lead Channel Pacing Threshold Amplitude: 0.5 V
Lead Channel Pacing Threshold Amplitude: 0.5 V
Lead Channel Pacing Threshold Pulse Width: 0.4 ms
Lead Channel Pacing Threshold Pulse Width: 0.4 ms
Lead Channel Sensing Intrinsic Amplitude: 12.25 mV
Lead Channel Sensing Intrinsic Amplitude: 12.25 mV
Lead Channel Sensing Intrinsic Amplitude: 3 mV
Lead Channel Sensing Intrinsic Amplitude: 3 mV
Lead Channel Setting Pacing Amplitude: 2 V
Lead Channel Setting Pacing Amplitude: 2.5 V
Lead Channel Setting Pacing Pulse Width: 0.4 ms
Lead Channel Setting Sensing Sensitivity: 1.2 mV

## 2019-11-15 ENCOUNTER — Other Ambulatory Visit: Payer: Self-pay | Admitting: Cardiovascular Disease

## 2019-11-15 DIAGNOSIS — I495 Sick sinus syndrome: Secondary | ICD-10-CM

## 2019-11-15 DIAGNOSIS — I480 Paroxysmal atrial fibrillation: Secondary | ICD-10-CM

## 2019-11-15 NOTE — Progress Notes (Signed)
PPM Remote  

## 2019-11-21 DIAGNOSIS — I2510 Atherosclerotic heart disease of native coronary artery without angina pectoris: Secondary | ICD-10-CM | POA: Diagnosis not present

## 2019-11-21 DIAGNOSIS — J069 Acute upper respiratory infection, unspecified: Secondary | ICD-10-CM | POA: Diagnosis not present

## 2019-11-21 DIAGNOSIS — R319 Hematuria, unspecified: Secondary | ICD-10-CM | POA: Diagnosis not present

## 2019-11-21 DIAGNOSIS — N184 Chronic kidney disease, stage 4 (severe): Secondary | ICD-10-CM | POA: Diagnosis not present

## 2019-11-21 DIAGNOSIS — N390 Urinary tract infection, site not specified: Secondary | ICD-10-CM | POA: Diagnosis not present

## 2019-11-21 DIAGNOSIS — E7800 Pure hypercholesterolemia, unspecified: Secondary | ICD-10-CM | POA: Diagnosis not present

## 2019-11-21 DIAGNOSIS — Z09 Encounter for follow-up examination after completed treatment for conditions other than malignant neoplasm: Secondary | ICD-10-CM | POA: Diagnosis not present

## 2019-11-21 DIAGNOSIS — H53133 Sudden visual loss, bilateral: Secondary | ICD-10-CM | POA: Diagnosis not present

## 2019-11-21 DIAGNOSIS — R05 Cough: Secondary | ICD-10-CM | POA: Diagnosis not present

## 2019-11-21 DIAGNOSIS — R300 Dysuria: Secondary | ICD-10-CM | POA: Diagnosis not present

## 2019-11-21 DIAGNOSIS — I5030 Unspecified diastolic (congestive) heart failure: Secondary | ICD-10-CM | POA: Diagnosis not present

## 2019-11-21 DIAGNOSIS — R001 Bradycardia, unspecified: Secondary | ICD-10-CM | POA: Diagnosis not present

## 2019-11-21 DIAGNOSIS — J302 Other seasonal allergic rhinitis: Secondary | ICD-10-CM | POA: Diagnosis not present

## 2019-11-21 DIAGNOSIS — M6281 Muscle weakness (generalized): Secondary | ICD-10-CM | POA: Diagnosis not present

## 2019-12-03 DIAGNOSIS — N184 Chronic kidney disease, stage 4 (severe): Secondary | ICD-10-CM | POA: Diagnosis not present

## 2019-12-03 DIAGNOSIS — I2510 Atherosclerotic heart disease of native coronary artery without angina pectoris: Secondary | ICD-10-CM | POA: Diagnosis not present

## 2019-12-03 DIAGNOSIS — R001 Bradycardia, unspecified: Secondary | ICD-10-CM | POA: Diagnosis not present

## 2019-12-03 DIAGNOSIS — H53133 Sudden visual loss, bilateral: Secondary | ICD-10-CM | POA: Diagnosis not present

## 2019-12-03 DIAGNOSIS — N390 Urinary tract infection, site not specified: Secondary | ICD-10-CM | POA: Diagnosis not present

## 2019-12-03 DIAGNOSIS — J302 Other seasonal allergic rhinitis: Secondary | ICD-10-CM | POA: Diagnosis not present

## 2019-12-03 DIAGNOSIS — R05 Cough: Secondary | ICD-10-CM | POA: Diagnosis not present

## 2019-12-03 DIAGNOSIS — E7800 Pure hypercholesterolemia, unspecified: Secondary | ICD-10-CM | POA: Diagnosis not present

## 2019-12-03 DIAGNOSIS — R300 Dysuria: Secondary | ICD-10-CM | POA: Diagnosis not present

## 2019-12-03 DIAGNOSIS — I5030 Unspecified diastolic (congestive) heart failure: Secondary | ICD-10-CM | POA: Diagnosis not present

## 2019-12-03 DIAGNOSIS — M6281 Muscle weakness (generalized): Secondary | ICD-10-CM | POA: Diagnosis not present

## 2019-12-03 DIAGNOSIS — J069 Acute upper respiratory infection, unspecified: Secondary | ICD-10-CM | POA: Diagnosis not present

## 2019-12-03 DIAGNOSIS — R319 Hematuria, unspecified: Secondary | ICD-10-CM | POA: Diagnosis not present

## 2019-12-03 DIAGNOSIS — Z09 Encounter for follow-up examination after completed treatment for conditions other than malignant neoplasm: Secondary | ICD-10-CM | POA: Diagnosis not present

## 2019-12-06 DIAGNOSIS — R7301 Impaired fasting glucose: Secondary | ICD-10-CM | POA: Diagnosis not present

## 2019-12-06 DIAGNOSIS — J449 Chronic obstructive pulmonary disease, unspecified: Secondary | ICD-10-CM | POA: Diagnosis not present

## 2019-12-06 DIAGNOSIS — I10 Essential (primary) hypertension: Secondary | ICD-10-CM | POA: Diagnosis not present

## 2019-12-06 DIAGNOSIS — E7800 Pure hypercholesterolemia, unspecified: Secondary | ICD-10-CM | POA: Diagnosis not present

## 2019-12-06 DIAGNOSIS — I480 Paroxysmal atrial fibrillation: Secondary | ICD-10-CM | POA: Diagnosis not present

## 2019-12-06 DIAGNOSIS — I6529 Occlusion and stenosis of unspecified carotid artery: Secondary | ICD-10-CM | POA: Diagnosis not present

## 2019-12-06 DIAGNOSIS — E790 Hyperuricemia without signs of inflammatory arthritis and tophaceous disease: Secondary | ICD-10-CM | POA: Diagnosis not present

## 2019-12-06 DIAGNOSIS — I130 Hypertensive heart and chronic kidney disease with heart failure and stage 1 through stage 4 chronic kidney disease, or unspecified chronic kidney disease: Secondary | ICD-10-CM | POA: Diagnosis not present

## 2019-12-06 DIAGNOSIS — N184 Chronic kidney disease, stage 4 (severe): Secondary | ICD-10-CM | POA: Diagnosis not present

## 2019-12-06 DIAGNOSIS — I5032 Chronic diastolic (congestive) heart failure: Secondary | ICD-10-CM | POA: Diagnosis not present

## 2019-12-06 DIAGNOSIS — H5340 Unspecified visual field defects: Secondary | ICD-10-CM | POA: Diagnosis not present

## 2019-12-06 DIAGNOSIS — I2510 Atherosclerotic heart disease of native coronary artery without angina pectoris: Secondary | ICD-10-CM | POA: Diagnosis not present

## 2019-12-10 DIAGNOSIS — K649 Unspecified hemorrhoids: Secondary | ICD-10-CM | POA: Diagnosis not present

## 2019-12-17 DIAGNOSIS — N390 Urinary tract infection, site not specified: Secondary | ICD-10-CM | POA: Diagnosis not present

## 2019-12-23 DIAGNOSIS — Z23 Encounter for immunization: Secondary | ICD-10-CM | POA: Diagnosis not present

## 2019-12-26 DIAGNOSIS — J302 Other seasonal allergic rhinitis: Secondary | ICD-10-CM | POA: Diagnosis not present

## 2019-12-26 DIAGNOSIS — H53133 Sudden visual loss, bilateral: Secondary | ICD-10-CM | POA: Diagnosis not present

## 2019-12-26 DIAGNOSIS — M6281 Muscle weakness (generalized): Secondary | ICD-10-CM | POA: Diagnosis not present

## 2019-12-26 DIAGNOSIS — N390 Urinary tract infection, site not specified: Secondary | ICD-10-CM | POA: Diagnosis not present

## 2019-12-26 DIAGNOSIS — R001 Bradycardia, unspecified: Secondary | ICD-10-CM | POA: Diagnosis not present

## 2019-12-26 DIAGNOSIS — E7800 Pure hypercholesterolemia, unspecified: Secondary | ICD-10-CM | POA: Diagnosis not present

## 2019-12-26 DIAGNOSIS — I2510 Atherosclerotic heart disease of native coronary artery without angina pectoris: Secondary | ICD-10-CM | POA: Diagnosis not present

## 2019-12-26 DIAGNOSIS — R300 Dysuria: Secondary | ICD-10-CM | POA: Diagnosis not present

## 2019-12-26 DIAGNOSIS — I5030 Unspecified diastolic (congestive) heart failure: Secondary | ICD-10-CM | POA: Diagnosis not present

## 2019-12-26 DIAGNOSIS — R05 Cough: Secondary | ICD-10-CM | POA: Diagnosis not present

## 2019-12-26 DIAGNOSIS — N184 Chronic kidney disease, stage 4 (severe): Secondary | ICD-10-CM | POA: Diagnosis not present

## 2019-12-26 DIAGNOSIS — Z09 Encounter for follow-up examination after completed treatment for conditions other than malignant neoplasm: Secondary | ICD-10-CM | POA: Diagnosis not present

## 2019-12-26 DIAGNOSIS — J069 Acute upper respiratory infection, unspecified: Secondary | ICD-10-CM | POA: Diagnosis not present

## 2020-01-09 DIAGNOSIS — Z85828 Personal history of other malignant neoplasm of skin: Secondary | ICD-10-CM | POA: Diagnosis not present

## 2020-01-09 DIAGNOSIS — L570 Actinic keratosis: Secondary | ICD-10-CM | POA: Diagnosis not present

## 2020-01-09 DIAGNOSIS — D0439 Carcinoma in situ of skin of other parts of face: Secondary | ICD-10-CM | POA: Diagnosis not present

## 2020-01-09 DIAGNOSIS — D485 Neoplasm of uncertain behavior of skin: Secondary | ICD-10-CM | POA: Diagnosis not present

## 2020-01-20 DIAGNOSIS — Z23 Encounter for immunization: Secondary | ICD-10-CM | POA: Diagnosis not present

## 2020-01-23 DIAGNOSIS — C44329 Squamous cell carcinoma of skin of other parts of face: Secondary | ICD-10-CM | POA: Diagnosis not present

## 2020-02-13 ENCOUNTER — Ambulatory Visit (INDEPENDENT_AMBULATORY_CARE_PROVIDER_SITE_OTHER): Payer: Medicare Other | Admitting: *Deleted

## 2020-02-13 DIAGNOSIS — I495 Sick sinus syndrome: Secondary | ICD-10-CM | POA: Diagnosis not present

## 2020-02-13 LAB — CUP PACEART REMOTE DEVICE CHECK
Battery Remaining Longevity: 144 mo
Battery Voltage: 3.03 V
Brady Statistic AP VP Percent: 0.03 %
Brady Statistic AP VS Percent: 59.82 %
Brady Statistic AS VP Percent: 0.05 %
Brady Statistic AS VS Percent: 40.1 %
Brady Statistic RA Percent Paced: 60.12 %
Brady Statistic RV Percent Paced: 0.08 %
Date Time Interrogation Session: 20210414212208
Implantable Lead Implant Date: 20100309
Implantable Lead Implant Date: 20100309
Implantable Lead Location: 753859
Implantable Lead Location: 753860
Implantable Lead Model: 5076
Implantable Lead Model: 5076
Implantable Pulse Generator Implant Date: 20190327
Lead Channel Impedance Value: 323 Ohm
Lead Channel Impedance Value: 399 Ohm
Lead Channel Impedance Value: 722 Ohm
Lead Channel Impedance Value: 760 Ohm
Lead Channel Pacing Threshold Amplitude: 0.5 V
Lead Channel Pacing Threshold Amplitude: 0.625 V
Lead Channel Pacing Threshold Pulse Width: 0.4 ms
Lead Channel Pacing Threshold Pulse Width: 0.4 ms
Lead Channel Sensing Intrinsic Amplitude: 15.125 mV
Lead Channel Sensing Intrinsic Amplitude: 15.125 mV
Lead Channel Sensing Intrinsic Amplitude: 2.875 mV
Lead Channel Sensing Intrinsic Amplitude: 2.875 mV
Lead Channel Setting Pacing Amplitude: 2 V
Lead Channel Setting Pacing Amplitude: 2.5 V
Lead Channel Setting Pacing Pulse Width: 0.4 ms
Lead Channel Setting Sensing Sensitivity: 1.2 mV

## 2020-02-14 NOTE — Progress Notes (Signed)
PPM Remote  

## 2020-02-18 DIAGNOSIS — E790 Hyperuricemia without signs of inflammatory arthritis and tophaceous disease: Secondary | ICD-10-CM | POA: Diagnosis not present

## 2020-02-18 DIAGNOSIS — I130 Hypertensive heart and chronic kidney disease with heart failure and stage 1 through stage 4 chronic kidney disease, or unspecified chronic kidney disease: Secondary | ICD-10-CM | POA: Diagnosis not present

## 2020-02-18 DIAGNOSIS — I480 Paroxysmal atrial fibrillation: Secondary | ICD-10-CM | POA: Diagnosis not present

## 2020-02-18 DIAGNOSIS — I10 Essential (primary) hypertension: Secondary | ICD-10-CM | POA: Diagnosis not present

## 2020-02-18 DIAGNOSIS — N184 Chronic kidney disease, stage 4 (severe): Secondary | ICD-10-CM | POA: Diagnosis not present

## 2020-02-18 DIAGNOSIS — J449 Chronic obstructive pulmonary disease, unspecified: Secondary | ICD-10-CM | POA: Diagnosis not present

## 2020-02-18 DIAGNOSIS — R7301 Impaired fasting glucose: Secondary | ICD-10-CM | POA: Diagnosis not present

## 2020-02-18 DIAGNOSIS — E7800 Pure hypercholesterolemia, unspecified: Secondary | ICD-10-CM | POA: Diagnosis not present

## 2020-02-18 DIAGNOSIS — I2510 Atherosclerotic heart disease of native coronary artery without angina pectoris: Secondary | ICD-10-CM | POA: Diagnosis not present

## 2020-02-18 DIAGNOSIS — H5340 Unspecified visual field defects: Secondary | ICD-10-CM | POA: Diagnosis not present

## 2020-02-18 DIAGNOSIS — I5032 Chronic diastolic (congestive) heart failure: Secondary | ICD-10-CM | POA: Diagnosis not present

## 2020-02-18 DIAGNOSIS — I6529 Occlusion and stenosis of unspecified carotid artery: Secondary | ICD-10-CM | POA: Diagnosis not present

## 2020-03-02 ENCOUNTER — Other Ambulatory Visit (INDEPENDENT_AMBULATORY_CARE_PROVIDER_SITE_OTHER): Payer: Self-pay | Admitting: *Deleted

## 2020-03-02 DIAGNOSIS — K219 Gastro-esophageal reflux disease without esophagitis: Secondary | ICD-10-CM

## 2020-03-02 MED ORDER — OMEPRAZOLE 20 MG PO CPDR: 20.0000 mg | DELAYED_RELEASE_CAPSULE | Freq: Two times a day (BID) | ORAL | 3 refills | Status: DC

## 2020-03-03 DIAGNOSIS — I6529 Occlusion and stenosis of unspecified carotid artery: Secondary | ICD-10-CM | POA: Diagnosis not present

## 2020-03-03 DIAGNOSIS — I10 Essential (primary) hypertension: Secondary | ICD-10-CM | POA: Diagnosis not present

## 2020-03-03 DIAGNOSIS — E7800 Pure hypercholesterolemia, unspecified: Secondary | ICD-10-CM | POA: Diagnosis not present

## 2020-03-03 DIAGNOSIS — E790 Hyperuricemia without signs of inflammatory arthritis and tophaceous disease: Secondary | ICD-10-CM | POA: Diagnosis not present

## 2020-03-03 DIAGNOSIS — I130 Hypertensive heart and chronic kidney disease with heart failure and stage 1 through stage 4 chronic kidney disease, or unspecified chronic kidney disease: Secondary | ICD-10-CM | POA: Diagnosis not present

## 2020-03-03 DIAGNOSIS — N184 Chronic kidney disease, stage 4 (severe): Secondary | ICD-10-CM | POA: Diagnosis not present

## 2020-03-03 DIAGNOSIS — I2510 Atherosclerotic heart disease of native coronary artery without angina pectoris: Secondary | ICD-10-CM | POA: Diagnosis not present

## 2020-03-03 DIAGNOSIS — R7301 Impaired fasting glucose: Secondary | ICD-10-CM | POA: Diagnosis not present

## 2020-03-03 DIAGNOSIS — J449 Chronic obstructive pulmonary disease, unspecified: Secondary | ICD-10-CM | POA: Diagnosis not present

## 2020-03-03 DIAGNOSIS — G4700 Insomnia, unspecified: Secondary | ICD-10-CM | POA: Diagnosis not present

## 2020-03-03 DIAGNOSIS — I480 Paroxysmal atrial fibrillation: Secondary | ICD-10-CM | POA: Diagnosis not present

## 2020-03-03 DIAGNOSIS — I5032 Chronic diastolic (congestive) heart failure: Secondary | ICD-10-CM | POA: Diagnosis not present

## 2020-03-04 DIAGNOSIS — H53133 Sudden visual loss, bilateral: Secondary | ICD-10-CM | POA: Diagnosis not present

## 2020-03-04 DIAGNOSIS — E7800 Pure hypercholesterolemia, unspecified: Secondary | ICD-10-CM | POA: Diagnosis not present

## 2020-03-04 DIAGNOSIS — I10 Essential (primary) hypertension: Secondary | ICD-10-CM | POA: Diagnosis not present

## 2020-03-04 DIAGNOSIS — G4700 Insomnia, unspecified: Secondary | ICD-10-CM | POA: Diagnosis not present

## 2020-03-04 DIAGNOSIS — I130 Hypertensive heart and chronic kidney disease with heart failure and stage 1 through stage 4 chronic kidney disease, or unspecified chronic kidney disease: Secondary | ICD-10-CM | POA: Diagnosis not present

## 2020-03-04 DIAGNOSIS — E782 Mixed hyperlipidemia: Secondary | ICD-10-CM | POA: Diagnosis not present

## 2020-03-04 DIAGNOSIS — H3412 Central retinal artery occlusion, left eye: Secondary | ICD-10-CM | POA: Diagnosis not present

## 2020-03-04 DIAGNOSIS — E039 Hypothyroidism, unspecified: Secondary | ICD-10-CM | POA: Diagnosis not present

## 2020-03-04 DIAGNOSIS — H5340 Unspecified visual field defects: Secondary | ICD-10-CM | POA: Diagnosis not present

## 2020-03-04 DIAGNOSIS — H5410 Blindness, one eye, low vision other eye, unspecified eyes: Secondary | ICD-10-CM | POA: Diagnosis not present

## 2020-03-04 DIAGNOSIS — F5105 Insomnia due to other mental disorder: Secondary | ICD-10-CM | POA: Diagnosis not present

## 2020-03-04 DIAGNOSIS — E790 Hyperuricemia without signs of inflammatory arthritis and tophaceous disease: Secondary | ICD-10-CM | POA: Diagnosis not present

## 2020-03-09 DIAGNOSIS — I5032 Chronic diastolic (congestive) heart failure: Secondary | ICD-10-CM | POA: Diagnosis not present

## 2020-03-09 DIAGNOSIS — I6529 Occlusion and stenosis of unspecified carotid artery: Secondary | ICD-10-CM | POA: Diagnosis not present

## 2020-03-09 DIAGNOSIS — N184 Chronic kidney disease, stage 4 (severe): Secondary | ICD-10-CM | POA: Diagnosis not present

## 2020-03-09 DIAGNOSIS — H5340 Unspecified visual field defects: Secondary | ICD-10-CM | POA: Diagnosis not present

## 2020-03-09 DIAGNOSIS — I480 Paroxysmal atrial fibrillation: Secondary | ICD-10-CM | POA: Diagnosis not present

## 2020-03-09 DIAGNOSIS — I10 Essential (primary) hypertension: Secondary | ICD-10-CM | POA: Diagnosis not present

## 2020-03-09 DIAGNOSIS — G4700 Insomnia, unspecified: Secondary | ICD-10-CM | POA: Diagnosis not present

## 2020-03-09 DIAGNOSIS — R7301 Impaired fasting glucose: Secondary | ICD-10-CM | POA: Diagnosis not present

## 2020-03-09 DIAGNOSIS — E7800 Pure hypercholesterolemia, unspecified: Secondary | ICD-10-CM | POA: Diagnosis not present

## 2020-03-09 DIAGNOSIS — E790 Hyperuricemia without signs of inflammatory arthritis and tophaceous disease: Secondary | ICD-10-CM | POA: Diagnosis not present

## 2020-03-09 DIAGNOSIS — I2510 Atherosclerotic heart disease of native coronary artery without angina pectoris: Secondary | ICD-10-CM | POA: Diagnosis not present

## 2020-03-09 DIAGNOSIS — J449 Chronic obstructive pulmonary disease, unspecified: Secondary | ICD-10-CM | POA: Diagnosis not present

## 2020-03-18 DIAGNOSIS — H6121 Impacted cerumen, right ear: Secondary | ICD-10-CM | POA: Diagnosis not present

## 2020-03-25 DIAGNOSIS — H34232 Retinal artery branch occlusion, left eye: Secondary | ICD-10-CM | POA: Diagnosis not present

## 2020-03-25 DIAGNOSIS — H40013 Open angle with borderline findings, low risk, bilateral: Secondary | ICD-10-CM | POA: Diagnosis not present

## 2020-03-25 DIAGNOSIS — H04123 Dry eye syndrome of bilateral lacrimal glands: Secondary | ICD-10-CM | POA: Diagnosis not present

## 2020-03-25 DIAGNOSIS — H35371 Puckering of macula, right eye: Secondary | ICD-10-CM | POA: Diagnosis not present

## 2020-04-13 ENCOUNTER — Other Ambulatory Visit: Payer: Self-pay | Admitting: Cardiovascular Disease

## 2020-04-29 ENCOUNTER — Other Ambulatory Visit: Payer: Self-pay | Admitting: Cardiovascular Disease

## 2020-04-29 DIAGNOSIS — I480 Paroxysmal atrial fibrillation: Secondary | ICD-10-CM

## 2020-04-29 DIAGNOSIS — I495 Sick sinus syndrome: Secondary | ICD-10-CM

## 2020-05-14 ENCOUNTER — Ambulatory Visit (INDEPENDENT_AMBULATORY_CARE_PROVIDER_SITE_OTHER): Payer: Medicare Other | Admitting: *Deleted

## 2020-05-14 DIAGNOSIS — I495 Sick sinus syndrome: Secondary | ICD-10-CM | POA: Diagnosis not present

## 2020-05-14 LAB — CUP PACEART REMOTE DEVICE CHECK
Battery Remaining Longevity: 140 mo
Battery Voltage: 3.03 V
Brady Statistic AP VP Percent: 0.04 %
Brady Statistic AP VS Percent: 55.41 %
Brady Statistic AS VP Percent: 0.06 %
Brady Statistic AS VS Percent: 44.5 %
Brady Statistic RA Percent Paced: 55.55 %
Brady Statistic RV Percent Paced: 0.1 %
Date Time Interrogation Session: 20210715044143
Implantable Lead Implant Date: 20100309
Implantable Lead Implant Date: 20100309
Implantable Lead Location: 753859
Implantable Lead Location: 753860
Implantable Lead Model: 5076
Implantable Lead Model: 5076
Implantable Pulse Generator Implant Date: 20190327
Lead Channel Impedance Value: 342 Ohm
Lead Channel Impedance Value: 380 Ohm
Lead Channel Impedance Value: 760 Ohm
Lead Channel Impedance Value: 798 Ohm
Lead Channel Pacing Threshold Amplitude: 0.5 V
Lead Channel Pacing Threshold Amplitude: 0.5 V
Lead Channel Pacing Threshold Pulse Width: 0.4 ms
Lead Channel Pacing Threshold Pulse Width: 0.4 ms
Lead Channel Sensing Intrinsic Amplitude: 11.625 mV
Lead Channel Sensing Intrinsic Amplitude: 11.625 mV
Lead Channel Sensing Intrinsic Amplitude: 2.625 mV
Lead Channel Sensing Intrinsic Amplitude: 2.625 mV
Lead Channel Setting Pacing Amplitude: 2 V
Lead Channel Setting Pacing Amplitude: 2.5 V
Lead Channel Setting Pacing Pulse Width: 0.4 ms
Lead Channel Setting Sensing Sensitivity: 1.2 mV

## 2020-05-18 NOTE — Progress Notes (Signed)
Remote pacemaker transmission.   

## 2020-05-19 ENCOUNTER — Other Ambulatory Visit: Payer: Self-pay

## 2020-05-19 ENCOUNTER — Ambulatory Visit (INDEPENDENT_AMBULATORY_CARE_PROVIDER_SITE_OTHER): Payer: Medicare Other | Admitting: Cardiovascular Disease

## 2020-05-19 VITALS — BP 159/90 | Ht 70.0 in | Wt 172.0 lb

## 2020-05-19 DIAGNOSIS — E7800 Pure hypercholesterolemia, unspecified: Secondary | ICD-10-CM

## 2020-05-19 DIAGNOSIS — I10 Essential (primary) hypertension: Secondary | ICD-10-CM | POA: Diagnosis not present

## 2020-05-19 DIAGNOSIS — I480 Paroxysmal atrial fibrillation: Secondary | ICD-10-CM

## 2020-05-19 DIAGNOSIS — G629 Polyneuropathy, unspecified: Secondary | ICD-10-CM

## 2020-05-19 DIAGNOSIS — I6522 Occlusion and stenosis of left carotid artery: Secondary | ICD-10-CM | POA: Diagnosis not present

## 2020-05-19 DIAGNOSIS — Z7901 Long term (current) use of anticoagulants: Secondary | ICD-10-CM

## 2020-05-19 DIAGNOSIS — I2510 Atherosclerotic heart disease of native coronary artery without angina pectoris: Secondary | ICD-10-CM

## 2020-05-19 DIAGNOSIS — Z950 Presence of cardiac pacemaker: Secondary | ICD-10-CM | POA: Diagnosis not present

## 2020-05-19 DIAGNOSIS — N1832 Chronic kidney disease, stage 3b: Secondary | ICD-10-CM | POA: Diagnosis not present

## 2020-05-19 DIAGNOSIS — I495 Sick sinus syndrome: Secondary | ICD-10-CM | POA: Diagnosis not present

## 2020-05-19 DIAGNOSIS — I5032 Chronic diastolic (congestive) heart failure: Secondary | ICD-10-CM | POA: Diagnosis not present

## 2020-05-19 MED ORDER — GABAPENTIN 100 MG PO CAPS: 100.0000 mg | ORAL_CAPSULE | Freq: Two times a day (BID) | ORAL | 0 refills | Status: DC

## 2020-05-19 NOTE — Progress Notes (Signed)
Cardiology Office Note    Date:  05/21/2020   ID:  Niger M Lalone, DOB [birth year], MRN [id number]  PCP:  Celene Squibb, MD  Cardiologist:  Sanda Klein, MD   Electrophysiologist:  None   Evaluation Performed:  Follow-Up Visit  Chief Complaint:  CAD, AFib, PM, HLP  History of Present Illness:    Niger Amy [name] is a 86 y.o. female with history of coronary artery disease and previous bypass surgery as well as sinus node dysfunction and persistent atrial arrhythmia, history of left inferior hemianopsia due to retinal artery occlusion.  She underwent a pacemaker generator change out in 2019 (Medtronic azure XT).  She has stage III chronic kidney disease and moderate carotid stenosis.  She has severe mixed hyperlipidemia intolerant of statins, on Repatha.  Complaints of similar to last year, mostly limited to fatigue.  Denies dyspnea at rest or with activity and often does not have orthopnea, PND or serious problems with leg edema (only mild sometimes at the end of the day).  She has palpitations once or twice a year and always briefly.  She denies syncope.  She reports that some days the fatigue is worse than others, but yesterday was a particularly difficult day.  Her blood pressure at home has been varying 128/50-152/63.  Her diastolic blood pressure is consistently low.  Yesterday her diastolic blood pressure was only 50 mmHg.  She has not had any problems with angina pectoris.  She has intermittent issues with back pain.  She said a few developments this year.  Device interrogation shows normal function.  She is not pacemaker dependent.  She has 49% atrial pacing but never requires ventricular pacing.  She has not had any atrial fibrillation since December 2019 although she has had very rare short episodes of atrial mode switch due to atrial tachycardia.  Lead parameters are normal and estimated generator longevity is 11.7 years.  Check the total dose he has menses in the shins.  She had  labs with Dr. Nevada Crane on 03/04/2020 and her creatinine was 1.58, which is pretty much her baseline.  Also had her lipid profile checked that it is reasonably good total cholesterol 188, HDL 52, triglycerides 231 calculated LDL is around 100 which is a substantial improvement from her baseline of 260.  In 2009 she had an acute inferior wall ST segment elevation myocardial infarction treated with urgent angioplasty and placement of a bare-metal stent. Subsequently, she underwent multivessel bypass surgery with LIMA to LAD, SVG to ramus SVG to RCA. She has no evidence of residual myocardial injury and has preserved left ventricular systolic function. In June 2015, cardiac catheterization showed 90% proximal and mid LAD with patent distal LIMA bypass; 40% ostial circumflex, not bypassed; unchanged 80% proximal ramus intermedius, SVG to ramus occluded; patent stent right coronary artery, SVG to right coronary artery occluded. Her LVEF was 55%, there was no significant mitral regurgitation and the LVEDP was high normal at 15 mm Hg, (when she was short of breath). [name] stated that her dyspnea is likely multifactorial and cannot be explained completely by cardiac findings. In May 2016 she had a low risk nuclear study.  In 2010 a permanent pacemaker (Medtronic) was implanted for symptomatic bradycardia.  She underwent a generator change out in March 2019.  In late 2013 she had a fall complicated by a hip fracture and radius fracture. She had an esophageal perforation complicated by mediastinitis and had a feeding tube for several months. In October 2015  she had another fall in her home (no syncope, tripped at [facility].carpet transition) and fractured her pelvis and humerus.   She has fairly severe hyperlipidemia with an LDL cholesterol in excess of 200. She is statin, niacin, ezetimibe and resin intolerant. She takes Repatha.  She has systolic HTN, but also a tendency to marked orthostatic hypotension (25 mm Hg  drop in SBP).  She has moderate CKD, baseline GFR around 30 mL/minute, has seen Dr. Lowanda Foster in the past, but does not have regular nephrology follow-up.  July 2019 she had an episode of unilateral inferior hemianopsia.  Brief episodes of atrial fibrillation were seen around that time.  There was also a moderate left internal carotid artery stenosis.  She had persistent atrial fibrillation from late July until November 2019.     Past Medical History:  Diagnosis Date  . Anxiety   . Arthritis   . Asthma   . CAD in native artery    s/p CABG x 3; Last Myoview in 07/2009 - non-ischemic; Echo 03/2012  Aortic Sclerosis with normal EF.  . CKD (chronic kidney disease), stage III   . DVT (deep vein thrombosis) in pregnancy   . Esophageal perforation    9/13  . Fall 07/21/2014   FALL WITH INJURY  . Fracture of head of humerus   . Fracture of hip ([place]) 07/21/2014   LEFT  . GERD (gastroesophageal reflux disease)   . Hypercholesteremia   . Hypertension   . Hypothyroidism   . Loss of vision 05/22/2018   LEFT EYE  . Mediastinitis    s/p drainage  . Myocardial infarction [facility])    Inferior STEMI 07/2008 - PCI of prox RCA followed byu CABG x 3 in 9/'09  . Pacemaker   . PAF (paroxysmal atrial fibrillation) ([place])   . Paroxysmal atrial flutter ([place]) 11/26/2014  . Pneumonia   . Shortness of breath   . SSS (sick sinus syndrome) ([place]) 11/26/2014  . Status post hip hemiarthroplasty left hip by Dr Ronnie Derby 08/08/2012   Past Surgical History:  Procedure Laterality Date  . CARDIOVERSION N/A 09/21/2018   Procedure: CARDIOVERSION;  Surgeon: Sanda Klein, MD;  Location: [place] ENDOSCOPY;  Service: Cardiovascular;  Laterality: N/A;  . CARPAL TUNNEL RELEASE  08/08/2012   Procedure: CARPAL TUNNEL RELEASE;  Surgeon: Schuyler Amor, MD;  Location: [place];  Service: Orthopedics;  Laterality: Left;  . CORONARY ARTERY BYPASS GRAFT  2009   LIMA-LAD, SVG-RI, SVG-stented RCA  . ESOPHAGOGASTRODUODENOSCOPY   08/10/2012   Procedure: ESOPHAGOGASTRODUODENOSCOPY (EGD);  Surgeon: Beryle Beams, MD;  Location[facility] ENDOSCOPY;  Service: Endoscopy;  Laterality: N/A;  . ESOPHAGOSCOPY  08/10/2012   Procedure: ESOPHAGOSCOPY;  Surgeon: [name], MD;  Location: [place];  Service: ENT;  Laterality: N/A;  . GASTROSTOMY  08/16/2012   Procedure: GASTROSTOMY;  Surgeon: Zenovia Jarred, MD;  Location: [place];  Service: General;  Laterality: N/A;  . HIP ARTHROPLASTY  08/08/2012   Procedure: ARTHROPLASTY BIPOLAR HIP;  Surgeon: Rudean Haskell, MD;  Location: [place];  Service: Orthopedics;  Laterality: Left;  [place]   . JOINT REPLACEMENT    . LEFT HEART CATHETERIZATION WITH CORONARY ANGIOGRAM N/A 04/16/2014   Procedure: LEFT HEART CATHETERIZATION WITH CORONARY ANGIOGRAM;  Surgeon: Wellington Hampshire, MD;  Location: [place] CATH LAB;  Service: Cardiovascular;  Laterality: N/A;  . NM MYOCAR PERF WALL MOTION  08/14/2009   Normal  . PACEMAKER INSERTION  01/06/2009   Medtronic  . PPM GENERATOR CHANGEOUT N/A 01/24/2018  Procedure: PPM GENERATOR CHANGEOUT;  Surgeon: Sanda Klein, MD;  Location: [place] CV LAB;  Service: Cardiovascular;  Laterality: N/A;  . TOTAL HIP ARTHROPLASTY    . WRIST FRACTURE SURGERY  07/2012   left intra articular  with carpel tunnel     Current Meds  Medication Sig  . acetaminophen (TYLENOL) 500 MG tablet Take 1,000 mg by mouth every 6 (six) hours as needed for moderate pain.  Marland Kitchen albuterol (PROVENTIL HFA;VENTOLIN HFA) 108 (90 Base) MCG/ACT inhaler Inhale 2 puffs into the lungs every 6 (six) hours as needed for wheezing or shortness of breath.   . allopurinol (ZYLOPRIM) 100 MG tablet Take 100 mg by mouth daily.  . calcium carbonate (TUMS - DOSED IN MG ELEMENTAL CALCIUM) 500 MG chewable tablet Chew 2 tablets by mouth daily as needed for indigestion or heartburn.  . Cholecalciferol (VITAMIN D3 PO) Take 1 tablet by mouth daily.  . cycloSPORINE (RESTASIS) 0.05 % ophthalmic emulsion Place 1 drop into  both eyes 2 (two) times daily.  Marland Kitchen docusate sodium (COLACE) 100 MG capsule Take 100 mg by mouth 2 (two) times daily.  Marland Kitchen ELIQUIS 2.5 MG TABS tablet Take 1 tablet by mouth twice daily  . Fluticasone-Umeclidin-Vilant (TRELEGY ELLIPTA) 100-62.5-25 MCG/INH AEPB Inhale 1 puff into the lungs daily.   . furosemide (LASIX) 40 MG tablet TAKE 1 TABLET BY MOUTH ONCE DAILY AS DIRECTED  . gabapentin (NEURONTIN) 100 MG capsule Take 1 capsule (100 mg total) by mouth 2 (two) times daily.  Marland Kitchen levothyroxine (SYNTHROID, LEVOTHROID) 50 MCG tablet Take 50 mcg by mouth daily before breakfast.   . loratadine (CLARITIN) 10 MG tablet Take 10 mg by mouth daily as needed for allergies.  . metoprolol succinate (TOPROL-XL) 25 MG 24 hr tablet Take 1 tablet by mouth once daily  . Misc Natural Products (TART CHERRY ADVANCED PO) Take 1 tablet by mouth daily.  . nitroGLYCERIN (NITROSTAT) 0.4 MG SL tablet Place 1 tablet (0.4 mg total) under the tongue every 5 (five) minutes x 3 doses as needed for chest pain.  Marland Kitchen omeprazole (PRILOSEC) 20 MG capsule Take 1 capsule (20 mg total) by mouth 2 (two) times daily before a meal.  . ondansetron (ZOFRAN) 4 MG tablet Take 1 tablet by mouth every 8 (eight) hours as needed for nausea or vomiting.   . potassium chloride (KLOR-CON) 10 MEQ tablet Take 1 tablet by mouth once daily  . Probiotic CAPS Take 1 capsule by mouth daily.  Marland Kitchen REPATHA SURECLICK 408 MG/ML SOAJ Inject 140 mg as directed every 14 (fourteen) days.   . [DISCONTINUED] ALPRAZolam (XANAX) 0.25 MG tablet Take 0.25 mg by mouth at bedtime.  . [DISCONTINUED] gabapentin (NEURONTIN) 100 MG capsule TAKE 1 CAPSULE BY MOUTH EVERY 8 HOURS AS NEEDED FOR NERVE PAIN START BY TAKING 1 CAPSULE BY MOUTH AT BEDTIME FOR 7 DAYS  . [DISCONTINUED] Wheat Dextrin (BENEFIBER DRINK MIX PO) Take 1 Dose by mouth daily.      Allergies:   Colestipol, Hydrocodone, Niacin and related, Statins, and Zetia [ezetimibe]   Social History   Tobacco Use  . Smoking  status: Never Smoker  . Smokeless tobacco: Never Used  Vaping Use  . Vaping Use: Never used  Substance Use Topics  . Alcohol use: No  . Drug use: No     Family Hx: The patient's family history includes Stroke in her father.  ROS:   Please see the history of present illness.    All other systems are reviewed and are negative.  Prior CV studies:   The following studies were reviewed today: Comprehensive remote pacemaker check today  Labs/Other Tests and Data Reviewed:    EKG: Ordered today, shows atrial paced, ventricular sensed rhythm with QS pattern in leads V1-V2, QTC 404 ms  Recent Labs: No results found for requested labs within last 8760 hours.   Recent Lipid Panel Lab Results  Component Value Date/Time   CHOL 193 08/13/2018 09:55 AM   TRIG 153 (H) 08/13/2018 09:55 AM   HDL 51 08/13/2018 09:55 AM   CHOLHDL 3.8 08/13/2018 09:55 AM   CHOLHDL 6.0 05/23/2018 08:56 AM   LDLCALC 111 (H) 08/13/2018 09:55 AM   Labs from 02/25/2019 show total cholesterol 192, HDL 60, LDL low not reported, triglycerides 175, hemoglobin A1c 5.9%, hemoglobin 13, creatinine 1.57, ALT 11, TSH 3.00  03/04/2020 creatinine 1.58, which is pretty much her baseline.   Hemoglobin A1c 6% Hemoglobin 12.4 TSH 4.42 Normal liver function tests total cholesterol 188, HDL 52, triglycerides 231  Wt Readings from Last 3 Encounters:  05/21/20 172 lb (78 kg)  05/16/19 170 lb (77.1 kg)  09/21/18 170 lb (77.1 kg)     Objective:    Vital Signs:  BP (!) 159/90   Ht 5\' 10"  (1.778 m)   Wt 172 lb (78 kg)   BMI 24.68 kg/m     General: Alert, oriented x3, no distress, appears well.  Healthy subclavian pacemaker site Head: no evidence of trauma, PERRL, EOMI, no exophtalmos or lid lag, no myxedema, no xanthelasma; normal ears, nose and oropharynx Neck: normal jugular venous pulsations and no hepatojugular reflux; brisk carotid pulses without delay and no carotid bruits Chest: clear to auscultation, no  signs of consolidation by percussion or palpation, normal fremitus, symmetrical and full respiratory excursions Cardiovascular: normal position and quality of the apical impulse, regular rhythm, normal first and second heart sounds, no murmurs, rubs or gallops Abdomen: no tenderness or distention, no masses by palpation, no abnormal pulsatility or arterial bruits, normal bowel sounds, no hepatosplenomegaly Extremities: no clubbing, cyanosis or edema; 2+ radial, ulnar and brachial pulses bilaterally; 2+ right femoral, posterior tibial and dorsalis pedis pulses; 2+ left femoral, posterior tibial and dorsalis pedis pulses; no subclavian or femoral bruits Neurological: grossly nonfocal Psych: Normal mood and affect   ASSESSMENT & PLAN:    1. AFib: No recurrence since cardioversion November 2019.  On appropriate anticoagulation, CHADSVasc 8 (age 81, CVA 2, gender, HF, PAD/CAD, HTN).  2. Eliquis: dose adjusted for age and kidney function; other than occasional hemorrhoidal bleeding, vascular complications 3. CHF: Clinically euvolemic, good functional status.  4. CAD s/p CABG: Denies angina..  5. SSS: Appropriate heart rate histogram distribution with current device settings. 6. PM: Normal device function she is not pacemaker dependent. 7. HTN: She does have very low diastolic blood pressure.  Avoid perfect control with systolic blood pressure since her "weak days" appear to occur when she has systolic blood pressures in the 381W , but diastolic blood pressure in the 50s. 8. HLP: Satisfactory lipid profile, intolerant to statins but is doing very well on Repatha. 9. Left internal carotid artery stenosis: Medical management. 10. Neuropathic foot pain: Gabapentin has been a great help.  At her request I refilled her prescription today.  Future refills via PCP. 11. CKD 3B: at baseline per recent labs.  Patient Instructions  Medication Instructions:  Repatha samples have been given  Gabapentin 100 mg  twice daily. Future refills need to be done by PCP.  *If you need  a refill on your cardiac medications before your next appointment, please call your pharmacy*   Lab Work: None ordered If you have labs (blood work) drawn today and your tests are completely normal, you will receive your results only by: Marland Kitchen MyChart Message (if you have MyChart) OR . A paper copy in the mail If you have any lab test that is abnormal or we need to change your treatment, we will call you to review the results.   Testing/Procedures: None ordered   Follow-Up: At [facility], you and your health needs are our priority.  As part of our continuing mission to provide you with exceptional heart care, we have created designated Provider Care Teams.  These Care Teams include your primary Cardiologist (physician) and Advanced Practice Providers (APPs -  Physician Assistants and Nurse Practitioners) who all work together to provide you with the care you need, when you need it.  We recommend signing up for the patient portal called "MyChart".  Sign up information is provided on this After Visit Summary.  MyChart is used to connect with patients for Virtual Visits (Telemedicine).  Patients are able to view lab/test results, encounter notes, upcoming appointments, etc.  Non-urgent messages can be sent to your provider as well.   To learn more about what you can do with MyChart, go to [email or web address].    Your next appointment:   12 month(s)  The format for your next appointment:   In Person  Provider:   Sanda Klein, MD       Signed, Sanda Klein, MD  05/21/2020 6:26 PM    [place]

## 2020-05-19 NOTE — Patient Instructions (Addendum)
Medication Instructions:  Repatha samples have been given  Gabapentin 100 mg twice daily. Future refills need to be done by PCP.  *If you need a refill on your cardiac medications before your next appointment, please call your pharmacy*   Lab Work: None ordered If you have labs (blood work) drawn today and your tests are completely normal, you will receive your results only by: Marland Kitchen MyChart Message (if you have MyChart) OR . A paper copy in the mail If you have any lab test that is abnormal or we need to change your treatment, we will call you to review the results.   Testing/Procedures: None ordered   Follow-Up: At [facility], you and your health needs are our priority.  As part of our continuing mission to provide you with exceptional heart care, we have created designated Provider Care Teams.  These Care Teams include your primary Cardiologist (physician) and Advanced Practice Providers (APPs -  Physician Assistants and Nurse Practitioners) who all work together to provide you with the care you need, when you need it.  We recommend signing up for the patient portal called "MyChart".  Sign up information is provided on this After Visit Summary.  MyChart is used to connect with patients for Virtual Visits (Telemedicine).  Patients are able to view lab/test results, encounter notes, upcoming appointments, etc.  Non-urgent messages can be sent to your provider as well.   To learn more about what you can do with MyChart, go to [email or web address].    Your next appointment:   12 month(s)  The format for your next appointment:   In Person  Provider:   Sanda Klein, MD

## 2020-05-21 ENCOUNTER — Encounter: Payer: Self-pay | Admitting: Cardiovascular Disease

## 2020-05-23 DIAGNOSIS — K219 Gastro-esophageal reflux disease without esophagitis: Secondary | ICD-10-CM | POA: Diagnosis not present

## 2020-05-23 DIAGNOSIS — R292 Abnormal reflex: Secondary | ICD-10-CM | POA: Diagnosis not present

## 2020-05-23 DIAGNOSIS — R05 Cough: Secondary | ICD-10-CM | POA: Diagnosis not present

## 2020-05-25 ENCOUNTER — Other Ambulatory Visit: Payer: Self-pay | Admitting: Cardiovascular Disease

## 2020-05-25 DIAGNOSIS — L821 Other seborrheic keratosis: Secondary | ICD-10-CM | POA: Diagnosis not present

## 2020-05-25 DIAGNOSIS — Z85828 Personal history of other malignant neoplasm of skin: Secondary | ICD-10-CM | POA: Diagnosis not present

## 2020-05-25 DIAGNOSIS — L570 Actinic keratosis: Secondary | ICD-10-CM | POA: Diagnosis not present

## 2020-05-27 DIAGNOSIS — R292 Abnormal reflex: Secondary | ICD-10-CM | POA: Diagnosis not present

## 2020-05-27 DIAGNOSIS — N189 Chronic kidney disease, unspecified: Secondary | ICD-10-CM | POA: Diagnosis not present

## 2020-05-27 DIAGNOSIS — I130 Hypertensive heart and chronic kidney disease with heart failure and stage 1 through stage 4 chronic kidney disease, or unspecified chronic kidney disease: Secondary | ICD-10-CM | POA: Diagnosis not present

## 2020-05-27 DIAGNOSIS — I5033 Acute on chronic diastolic (congestive) heart failure: Secondary | ICD-10-CM | POA: Diagnosis not present

## 2020-05-27 DIAGNOSIS — K219 Gastro-esophageal reflux disease without esophagitis: Secondary | ICD-10-CM | POA: Diagnosis not present

## 2020-05-27 DIAGNOSIS — R05 Cough: Secondary | ICD-10-CM | POA: Diagnosis not present

## 2020-08-07 DIAGNOSIS — R292 Abnormal reflex: Secondary | ICD-10-CM | POA: Diagnosis not present

## 2020-08-07 DIAGNOSIS — N189 Chronic kidney disease, unspecified: Secondary | ICD-10-CM | POA: Diagnosis not present

## 2020-08-07 DIAGNOSIS — I5033 Acute on chronic diastolic (congestive) heart failure: Secondary | ICD-10-CM | POA: Diagnosis not present

## 2020-08-07 DIAGNOSIS — K219 Gastro-esophageal reflux disease without esophagitis: Secondary | ICD-10-CM | POA: Diagnosis not present

## 2020-08-07 DIAGNOSIS — I130 Hypertensive heart and chronic kidney disease with heart failure and stage 1 through stage 4 chronic kidney disease, or unspecified chronic kidney disease: Secondary | ICD-10-CM | POA: Diagnosis not present

## 2020-08-13 ENCOUNTER — Ambulatory Visit (INDEPENDENT_AMBULATORY_CARE_PROVIDER_SITE_OTHER): Payer: Medicare Other

## 2020-08-13 DIAGNOSIS — I495 Sick sinus syndrome: Secondary | ICD-10-CM

## 2020-08-15 LAB — CUP PACEART REMOTE DEVICE CHECK
Battery Remaining Longevity: 136 mo
Battery Voltage: 3.03 V
Brady Statistic AP VP Percent: 0.06 %
Brady Statistic AP VS Percent: 57.92 %
Brady Statistic AS VP Percent: 0.06 %
Brady Statistic AS VS Percent: 41.97 %
Brady Statistic RA Percent Paced: 58.1 %
Brady Statistic RV Percent Paced: 0.12 %
Date Time Interrogation Session: 20211014084433
Implantable Lead Implant Date: 20100309
Implantable Lead Implant Date: 20100309
Implantable Lead Location: 753859
Implantable Lead Location: 753860
Implantable Lead Model: 5076
Implantable Lead Model: 5076
Implantable Pulse Generator Implant Date: 20190327
Lead Channel Impedance Value: 323 Ohm
Lead Channel Impedance Value: 380 Ohm
Lead Channel Impedance Value: 722 Ohm
Lead Channel Impedance Value: 760 Ohm
Lead Channel Pacing Threshold Amplitude: 0.5 V
Lead Channel Pacing Threshold Amplitude: 0.5 V
Lead Channel Pacing Threshold Pulse Width: 0.4 ms
Lead Channel Pacing Threshold Pulse Width: 0.4 ms
Lead Channel Sensing Intrinsic Amplitude: 11.75 mV
Lead Channel Sensing Intrinsic Amplitude: 11.75 mV
Lead Channel Sensing Intrinsic Amplitude: 2.625 mV
Lead Channel Sensing Intrinsic Amplitude: 2.625 mV
Lead Channel Setting Pacing Amplitude: 2 V
Lead Channel Setting Pacing Amplitude: 2.5 V
Lead Channel Setting Pacing Pulse Width: 0.4 ms
Lead Channel Setting Sensing Sensitivity: 1.2 mV

## 2020-08-18 NOTE — Progress Notes (Signed)
Remote pacemaker transmission.   

## 2020-08-19 DIAGNOSIS — J069 Acute upper respiratory infection, unspecified: Secondary | ICD-10-CM | POA: Diagnosis not present

## 2020-08-19 DIAGNOSIS — I2510 Atherosclerotic heart disease of native coronary artery without angina pectoris: Secondary | ICD-10-CM | POA: Diagnosis not present

## 2020-08-19 DIAGNOSIS — J302 Other seasonal allergic rhinitis: Secondary | ICD-10-CM | POA: Diagnosis not present

## 2020-08-19 DIAGNOSIS — L989 Disorder of the skin and subcutaneous tissue, unspecified: Secondary | ICD-10-CM | POA: Diagnosis not present

## 2020-08-19 DIAGNOSIS — E7800 Pure hypercholesterolemia, unspecified: Secondary | ICD-10-CM | POA: Diagnosis not present

## 2020-08-19 DIAGNOSIS — I5030 Unspecified diastolic (congestive) heart failure: Secondary | ICD-10-CM | POA: Diagnosis not present

## 2020-08-19 DIAGNOSIS — R300 Dysuria: Secondary | ICD-10-CM | POA: Diagnosis not present

## 2020-08-19 DIAGNOSIS — N184 Chronic kidney disease, stage 4 (severe): Secondary | ICD-10-CM | POA: Diagnosis not present

## 2020-08-19 DIAGNOSIS — Z09 Encounter for follow-up examination after completed treatment for conditions other than malignant neoplasm: Secondary | ICD-10-CM | POA: Diagnosis not present

## 2020-08-19 DIAGNOSIS — R001 Bradycardia, unspecified: Secondary | ICD-10-CM | POA: Diagnosis not present

## 2020-08-19 DIAGNOSIS — N390 Urinary tract infection, site not specified: Secondary | ICD-10-CM | POA: Diagnosis not present

## 2020-08-19 DIAGNOSIS — H53133 Sudden visual loss, bilateral: Secondary | ICD-10-CM | POA: Diagnosis not present

## 2020-08-19 DIAGNOSIS — M6281 Muscle weakness (generalized): Secondary | ICD-10-CM | POA: Diagnosis not present

## 2020-08-20 ENCOUNTER — Other Ambulatory Visit: Payer: Self-pay | Admitting: Cardiovascular Disease

## 2020-08-20 DIAGNOSIS — I495 Sick sinus syndrome: Secondary | ICD-10-CM

## 2020-08-20 DIAGNOSIS — I480 Paroxysmal atrial fibrillation: Secondary | ICD-10-CM

## 2020-08-21 ENCOUNTER — Telehealth (INDEPENDENT_AMBULATORY_CARE_PROVIDER_SITE_OTHER): Payer: Self-pay | Admitting: Gastroenterology

## 2020-08-21 NOTE — Telephone Encounter (Signed)
Pt requesting refills of omeprazole 20mg  BID - last seen 07/2018. She can make f/up appt for refills or buy OTC or get from PCP. Thanks.   If she makes appt I can refill a supply until able to be seen in clinic. Thanks.

## 2020-08-25 DIAGNOSIS — R059 Cough, unspecified: Secondary | ICD-10-CM | POA: Diagnosis not present

## 2020-09-02 DIAGNOSIS — M6281 Muscle weakness (generalized): Secondary | ICD-10-CM | POA: Diagnosis not present

## 2020-09-02 DIAGNOSIS — L989 Disorder of the skin and subcutaneous tissue, unspecified: Secondary | ICD-10-CM | POA: Diagnosis not present

## 2020-09-02 DIAGNOSIS — E7800 Pure hypercholesterolemia, unspecified: Secondary | ICD-10-CM | POA: Diagnosis not present

## 2020-09-02 DIAGNOSIS — R300 Dysuria: Secondary | ICD-10-CM | POA: Diagnosis not present

## 2020-09-02 DIAGNOSIS — I2510 Atherosclerotic heart disease of native coronary artery without angina pectoris: Secondary | ICD-10-CM | POA: Diagnosis not present

## 2020-09-02 DIAGNOSIS — Z09 Encounter for follow-up examination after completed treatment for conditions other than malignant neoplasm: Secondary | ICD-10-CM | POA: Diagnosis not present

## 2020-09-02 DIAGNOSIS — N184 Chronic kidney disease, stage 4 (severe): Secondary | ICD-10-CM | POA: Diagnosis not present

## 2020-09-02 DIAGNOSIS — I5030 Unspecified diastolic (congestive) heart failure: Secondary | ICD-10-CM | POA: Diagnosis not present

## 2020-09-02 DIAGNOSIS — J302 Other seasonal allergic rhinitis: Secondary | ICD-10-CM | POA: Diagnosis not present

## 2020-09-02 DIAGNOSIS — R001 Bradycardia, unspecified: Secondary | ICD-10-CM | POA: Diagnosis not present

## 2020-09-02 DIAGNOSIS — H53133 Sudden visual loss, bilateral: Secondary | ICD-10-CM | POA: Diagnosis not present

## 2020-09-02 DIAGNOSIS — J069 Acute upper respiratory infection, unspecified: Secondary | ICD-10-CM | POA: Diagnosis not present

## 2020-09-07 DIAGNOSIS — N184 Chronic kidney disease, stage 4 (severe): Secondary | ICD-10-CM | POA: Diagnosis not present

## 2020-09-07 DIAGNOSIS — I480 Paroxysmal atrial fibrillation: Secondary | ICD-10-CM | POA: Diagnosis not present

## 2020-09-07 DIAGNOSIS — I6529 Occlusion and stenosis of unspecified carotid artery: Secondary | ICD-10-CM | POA: Diagnosis not present

## 2020-09-07 DIAGNOSIS — I10 Essential (primary) hypertension: Secondary | ICD-10-CM | POA: Diagnosis not present

## 2020-09-07 DIAGNOSIS — R32 Unspecified urinary incontinence: Secondary | ICD-10-CM | POA: Diagnosis not present

## 2020-09-07 DIAGNOSIS — E790 Hyperuricemia without signs of inflammatory arthritis and tophaceous disease: Secondary | ICD-10-CM | POA: Diagnosis not present

## 2020-09-07 DIAGNOSIS — H5340 Unspecified visual field defects: Secondary | ICD-10-CM | POA: Diagnosis not present

## 2020-09-07 DIAGNOSIS — I2510 Atherosclerotic heart disease of native coronary artery without angina pectoris: Secondary | ICD-10-CM | POA: Diagnosis not present

## 2020-09-07 DIAGNOSIS — I5032 Chronic diastolic (congestive) heart failure: Secondary | ICD-10-CM | POA: Diagnosis not present

## 2020-09-07 DIAGNOSIS — E7800 Pure hypercholesterolemia, unspecified: Secondary | ICD-10-CM | POA: Diagnosis not present

## 2020-09-07 DIAGNOSIS — R7301 Impaired fasting glucose: Secondary | ICD-10-CM | POA: Diagnosis not present

## 2020-09-07 DIAGNOSIS — Z0001 Encounter for general adult medical examination with abnormal findings: Secondary | ICD-10-CM | POA: Diagnosis not present

## 2020-09-07 DIAGNOSIS — J449 Chronic obstructive pulmonary disease, unspecified: Secondary | ICD-10-CM | POA: Diagnosis not present

## 2020-09-10 DIAGNOSIS — Z23 Encounter for immunization: Secondary | ICD-10-CM | POA: Diagnosis not present

## 2020-09-26 ENCOUNTER — Encounter (HOSPITAL_COMMUNITY): Payer: Self-pay | Admitting: *Deleted

## 2020-09-26 ENCOUNTER — Other Ambulatory Visit: Payer: Self-pay

## 2020-09-26 ENCOUNTER — Emergency Department (HOSPITAL_COMMUNITY)
Admission: EM | Admit: 2020-09-26 | Discharge: 2020-09-26 | Disposition: A | Discharge status: Home / Self Care | Payer: Medicare Other | Attending: Emergency Medicine | Admitting: Emergency Medicine

## 2020-09-26 DIAGNOSIS — M79672 Pain in left foot: Secondary | ICD-10-CM | POA: Diagnosis not present

## 2020-09-26 DIAGNOSIS — R0602 Shortness of breath: Secondary | ICD-10-CM | POA: Insufficient documentation

## 2020-09-26 DIAGNOSIS — M25572 Pain in left ankle and joints of left foot: Secondary | ICD-10-CM | POA: Insufficient documentation

## 2020-09-26 DIAGNOSIS — Z5321 Procedure and treatment not carried out due to patient leaving prior to being seen by health care provider: Secondary | ICD-10-CM | POA: Insufficient documentation

## 2020-09-26 NOTE — ED Notes (Signed)
Went to call pt to pull back to room, reported by greeter that pt left.

## 2020-09-26 NOTE — ED Triage Notes (Signed)
Pt with left foot and left ankle swelling starting this morning.  Denies any injury.  Pt states she has been SOB since Wednesday.

## 2020-09-30 DIAGNOSIS — J302 Other seasonal allergic rhinitis: Secondary | ICD-10-CM | POA: Diagnosis not present

## 2020-09-30 DIAGNOSIS — N184 Chronic kidney disease, stage 4 (severe): Secondary | ICD-10-CM | POA: Diagnosis not present

## 2020-09-30 DIAGNOSIS — J069 Acute upper respiratory infection, unspecified: Secondary | ICD-10-CM | POA: Diagnosis not present

## 2020-09-30 DIAGNOSIS — I5030 Unspecified diastolic (congestive) heart failure: Secondary | ICD-10-CM | POA: Diagnosis not present

## 2020-09-30 DIAGNOSIS — R059 Cough, unspecified: Secondary | ICD-10-CM | POA: Diagnosis not present

## 2020-09-30 DIAGNOSIS — H53133 Sudden visual loss, bilateral: Secondary | ICD-10-CM | POA: Diagnosis not present

## 2020-09-30 DIAGNOSIS — Z09 Encounter for follow-up examination after completed treatment for conditions other than malignant neoplasm: Secondary | ICD-10-CM | POA: Diagnosis not present

## 2020-09-30 DIAGNOSIS — R001 Bradycardia, unspecified: Secondary | ICD-10-CM | POA: Diagnosis not present

## 2020-09-30 DIAGNOSIS — L989 Disorder of the skin and subcutaneous tissue, unspecified: Secondary | ICD-10-CM | POA: Diagnosis not present

## 2020-09-30 DIAGNOSIS — E7800 Pure hypercholesterolemia, unspecified: Secondary | ICD-10-CM | POA: Diagnosis not present

## 2020-09-30 DIAGNOSIS — I2510 Atherosclerotic heart disease of native coronary artery without angina pectoris: Secondary | ICD-10-CM | POA: Diagnosis not present

## 2020-09-30 DIAGNOSIS — M6281 Muscle weakness (generalized): Secondary | ICD-10-CM | POA: Diagnosis not present

## 2020-10-01 DIAGNOSIS — R600 Localized edema: Secondary | ICD-10-CM | POA: Diagnosis not present

## 2020-10-01 DIAGNOSIS — J302 Other seasonal allergic rhinitis: Secondary | ICD-10-CM | POA: Diagnosis not present

## 2020-10-01 DIAGNOSIS — M25571 Pain in right ankle and joints of right foot: Secondary | ICD-10-CM | POA: Diagnosis not present

## 2020-10-01 DIAGNOSIS — N390 Urinary tract infection, site not specified: Secondary | ICD-10-CM | POA: Diagnosis not present

## 2020-10-01 DIAGNOSIS — Z09 Encounter for follow-up examination after completed treatment for conditions other than malignant neoplasm: Secondary | ICD-10-CM | POA: Diagnosis not present

## 2020-10-01 DIAGNOSIS — M6281 Muscle weakness (generalized): Secondary | ICD-10-CM | POA: Diagnosis not present

## 2020-10-01 DIAGNOSIS — L989 Disorder of the skin and subcutaneous tissue, unspecified: Secondary | ICD-10-CM | POA: Diagnosis not present

## 2020-10-01 DIAGNOSIS — H53133 Sudden visual loss, bilateral: Secondary | ICD-10-CM | POA: Diagnosis not present

## 2020-10-01 DIAGNOSIS — E7800 Pure hypercholesterolemia, unspecified: Secondary | ICD-10-CM | POA: Diagnosis not present

## 2020-10-01 DIAGNOSIS — I5030 Unspecified diastolic (congestive) heart failure: Secondary | ICD-10-CM | POA: Diagnosis not present

## 2020-10-01 DIAGNOSIS — E039 Hypothyroidism, unspecified: Secondary | ICD-10-CM | POA: Diagnosis not present

## 2020-10-01 DIAGNOSIS — N184 Chronic kidney disease, stage 4 (severe): Secondary | ICD-10-CM | POA: Diagnosis not present

## 2020-10-01 DIAGNOSIS — R0602 Shortness of breath: Secondary | ICD-10-CM | POA: Diagnosis not present

## 2020-10-01 DIAGNOSIS — R059 Cough, unspecified: Secondary | ICD-10-CM | POA: Diagnosis not present

## 2020-10-01 DIAGNOSIS — J069 Acute upper respiratory infection, unspecified: Secondary | ICD-10-CM | POA: Diagnosis not present

## 2020-10-01 DIAGNOSIS — I2510 Atherosclerotic heart disease of native coronary artery without angina pectoris: Secondary | ICD-10-CM | POA: Diagnosis not present

## 2020-10-01 DIAGNOSIS — R001 Bradycardia, unspecified: Secondary | ICD-10-CM | POA: Diagnosis not present

## 2020-10-07 DIAGNOSIS — R531 Weakness: Secondary | ICD-10-CM | POA: Diagnosis not present

## 2020-10-07 DIAGNOSIS — R4020 Unspecified coma: Secondary | ICD-10-CM | POA: Diagnosis not present

## 2020-10-07 DIAGNOSIS — I10 Essential (primary) hypertension: Secondary | ICD-10-CM | POA: Diagnosis not present

## 2020-10-07 DIAGNOSIS — R55 Syncope and collapse: Secondary | ICD-10-CM | POA: Diagnosis not present

## 2020-10-09 ENCOUNTER — Telehealth: Payer: Self-pay | Admitting: Cardiovascular Disease

## 2020-10-09 ENCOUNTER — Telehealth: Payer: Self-pay

## 2020-10-09 NOTE — Telephone Encounter (Signed)
Spoke with patient's daughter. Advised to do manual download. Provided MD comments on other concerns. She voiced understanding and will do ASAP

## 2020-10-09 NOTE — Telephone Encounter (Signed)
Amy [name] has a long history with coronary disease, and if she thinks she had a heart attack, she might just know what she is talking about.  I am not sure why she refused to go to the emergency room. Passing out on the commode is frequently due to vasovagal syncope.  She cannot have bradycardia since she has a pacemaker, but she could still get severely hypotensive.  The drop in blood pressure can be severe but is always transient, so by the time EMS checked the blood pressure, it would not be surprising for her BP to be back to normal.  I do not have access to the ECG that EMS did. The pacemaker download could show evidence of ventricular arrhythmia, that can also make her lose consciousness, but will not make a diagnosis of a myocardial infarction ("heart attack").  Will keep my eyes open for the download (most recent download currently available for review was October 13, may need to do a IT trainer).

## 2020-10-09 NOTE — Telephone Encounter (Signed)
Spoke with patient's daughter. Patient has been having shortness of breath & passed out on Wednesday 12/8.   On Wednesday, patient felt she had to have a BM - daughter put bedside commode near her but daughter had to leave. When she arrived home, patient was passed out on the beside commode. EMS was called - BP 037V systolic, daughter was told EKG indicated patient had had an MI. Daughter states patient told her that she felt sick while having a BM and subsequently passed out. Patient refused transport to ED  Patient has had weakness since this episode. Patient does have recurrent UTIs - on Bactrim now  Wednesday before Thanksgiving (11/24), daughter reports patient said she thought she had a heart attack. Daughter states this is when her shortness of breath started  Daughter also called in to see if her pacemaker showed anything and a message was routed to device clinic  Advised will route to Dr. Sallyanne Kuster & Lattie Haw RN for advice and we will contact her

## 2020-10-09 NOTE — Telephone Encounter (Signed)
Reviewed the download. There have not been any meaningful abnormalities since the last download in October. She is not pacemaker dependent. Rhythm was normal at the time of download. Suspect vasovagal event.  Again, cannot diagnose a "heart attack" with a pacemaker, but really no changes at all since October.

## 2020-10-09 NOTE — Telephone Encounter (Signed)
New Message:     Daughter said pt passed on Wednesday(10/26/90). She wants to know if her Monitor showed anything strange this week.

## 2020-10-09 NOTE — Telephone Encounter (Signed)
Pt c/o Syncope: STAT if syncope occurred within 30 minutes and pt complains of lightheadedness High Priority if episode of passing out, completely, today or in last 24 hours   1. Did you pass out today? no  2. When is the last time you passed out?  Wednesday   3. Has this occurred multiple times?no  4. Did you have any symptoms prior to passing out?  Shortness of breath, worse than normal, but a little better today,  no energy

## 2020-10-12 NOTE — Telephone Encounter (Signed)
Spoke with patient's daughter about advice from MD. She voiced understanding. She states her mom is short of breath, which was noted in prior message below. She has CHF but was euvolemic per last MD note. Advised to monitor for shortness of breath, swelling, weight gain. Offered an appointment for sooner follow up (not due until July 2022) but daughter declined to schedule at this time.

## 2020-10-14 NOTE — Telephone Encounter (Signed)
Spoke with daughter who reports that patient passed out 10/07/20. She has had increased SOB over the past week. Patient has not had a weight gain over the past week according to daughter and no increased pedal or peripheral edema. Remote transmission showed device function WNL with no episodes of arryhthmias since last remote on 08/13/20. Recommended patient contact PCP and will forward note to cardiology.

## 2020-10-14 NOTE — Telephone Encounter (Signed)
LMOM to call DC and # provided. Question concerning remotes.

## 2020-10-15 NOTE — Telephone Encounter (Signed)
Thanks. Please see my 12/10 telephone encounter note.

## 2020-10-26 ENCOUNTER — Telehealth: Payer: Self-pay | Admitting: Cardiovascular Disease

## 2020-10-26 DIAGNOSIS — R0602 Shortness of breath: Secondary | ICD-10-CM

## 2020-10-26 NOTE — Telephone Encounter (Signed)
Pt c/o Shortness Of Breath: STAT if SOB developed within the last 24 hours or pt is noticeably SOB on the phone  1. Are you currently SOB (can you hear that pt is SOB on the phone)? no  2. How long have you been experiencing SOB? 3 or 4 weeks  3. Are you SOB when sitting or when up moving around? Can not walk to 2 rooms without being short of breath. When she have a bowel movement  It trigger the shortness of breath  4. Are you currently experiencing any other symptoms? Weakness- wants to be seen asap please

## 2020-10-26 NOTE — Telephone Encounter (Signed)
Spoke with pt's daughter Juliann Pulse (okay per DPR). Daughter states pt has been weak for about 3 weeks and she is calling to update Dr. Sallyanne Kuster on the pt's status. She states that the pt's energy level of and breathing have deteriorated over this time, and the pt cannot walk more than 2 rooms in her home before becoming "tremendously" short of breath. This occurs only with exertion, or when the patient is on the commode. The pt has not experienced any further episodes of syncope. However, she has has had two additional episodes of "extreme shortness of breath" while on the commode. Sitting in her chair resting, the patient does not experience symptoms.   Oxygen has not dropped below 93% even during episodes of shortness of breath. Daughter states the pt's pulse generally runs 70s-low 80s and BP is usually 120s/60s.  An appointment has been made for November 19, 2020 at 10:40 a.m. Daughter instructed to have pt urgently evaluated for any loss of consciousness, chest pain, acutely worsening shortness of breath, oxygen desaturation. She verbalizes understanding. Will route to MD for review.

## 2020-10-26 NOTE — Telephone Encounter (Signed)
Spoke with the patient's daughter. She will complete her download today.  ECHO has been ordered and message sent to scheduling.

## 2020-10-26 NOTE — Telephone Encounter (Signed)
Please have her do an early pacemaker download and schedule her for echo (for dyspnea) prior to that visit.

## 2020-10-26 NOTE — Addendum Note (Signed)
Addended by: Ricci Barker on: 10/26/2020 05:27 PM   Modules accepted: Orders

## 2020-10-27 NOTE — Telephone Encounter (Signed)
Manual transmission received.  I have exported to Dr. Victorino December inbasket for review.

## 2020-10-29 NOTE — Telephone Encounter (Signed)
Called the patient's daughter to check on the patient.  The patient is still having shortness of breath on exertion during daily activities such as showering. Her weight was up two pounds but the daughter said it does fluctuate. She denies any edema.   Her echo is scheduled for 11/16/20.  She has been advised to call us back if she feels like the patient has worsening shortness of breath upon rest, edema, or any further concerns.

## 2020-10-30 DIAGNOSIS — M25571 Pain in right ankle and joints of right foot: Secondary | ICD-10-CM | POA: Diagnosis not present

## 2020-10-30 DIAGNOSIS — N390 Urinary tract infection, site not specified: Secondary | ICD-10-CM | POA: Diagnosis not present

## 2020-10-30 DIAGNOSIS — R600 Localized edema: Secondary | ICD-10-CM | POA: Diagnosis not present

## 2020-10-30 DIAGNOSIS — E039 Hypothyroidism, unspecified: Secondary | ICD-10-CM | POA: Diagnosis not present

## 2020-10-30 DIAGNOSIS — R0602 Shortness of breath: Secondary | ICD-10-CM | POA: Diagnosis not present

## 2020-10-30 NOTE — Telephone Encounter (Signed)
I reviewed the pacemaker download. She has had atrial fibrillation for several hours on 12/23 and 12/26, but is mostly in normal rhythm (including at the time of the download). The AFib is likely not the cause of her weakness and dyspnea, which have been going on for much longer. Does not require V pacing/not device dependent.

## 2020-11-09 DIAGNOSIS — C44622 Squamous cell carcinoma of skin of right upper limb, including shoulder: Secondary | ICD-10-CM | POA: Diagnosis not present

## 2020-11-09 DIAGNOSIS — L570 Actinic keratosis: Secondary | ICD-10-CM | POA: Diagnosis not present

## 2020-11-12 ENCOUNTER — Ambulatory Visit (INDEPENDENT_AMBULATORY_CARE_PROVIDER_SITE_OTHER): Payer: Medicare Other

## 2020-11-12 DIAGNOSIS — R001 Bradycardia, unspecified: Secondary | ICD-10-CM

## 2020-11-12 LAB — CUP PACEART REMOTE DEVICE CHECK
Battery Remaining Longevity: 133 mo
Battery Voltage: 3.02 V
Brady Statistic AP VP Percent: 0.06 %
Brady Statistic AP VS Percent: 58.06 %
Brady Statistic AS VP Percent: 0.08 %
Brady Statistic AS VS Percent: 41.8 %
Brady Statistic RA Percent Paced: 58.21 %
Brady Statistic RV Percent Paced: 0.14 %
Date Time Interrogation Session: 20220112184550
Implantable Lead Implant Date: 20100309
Implantable Lead Implant Date: 20100309
Implantable Lead Location: 753859
Implantable Lead Location: 753860
Implantable Lead Model: 5076
Implantable Lead Model: 5076
Implantable Pulse Generator Implant Date: 20190327
Lead Channel Impedance Value: 323 Ohm
Lead Channel Impedance Value: 380 Ohm
Lead Channel Impedance Value: 703 Ohm
Lead Channel Impedance Value: 741 Ohm
Lead Channel Pacing Threshold Amplitude: 0.5 V
Lead Channel Pacing Threshold Amplitude: 0.5 V
Lead Channel Pacing Threshold Pulse Width: 0.4 ms
Lead Channel Pacing Threshold Pulse Width: 0.4 ms
Lead Channel Sensing Intrinsic Amplitude: 10.375 mV
Lead Channel Sensing Intrinsic Amplitude: 10.375 mV
Lead Channel Sensing Intrinsic Amplitude: 2.75 mV
Lead Channel Sensing Intrinsic Amplitude: 2.75 mV
Lead Channel Setting Pacing Amplitude: 2 V
Lead Channel Setting Pacing Amplitude: 2.5 V
Lead Channel Setting Pacing Pulse Width: 0.4 ms
Lead Channel Setting Sensing Sensitivity: 1.2 mV

## 2020-11-16 ENCOUNTER — Other Ambulatory Visit (HOSPITAL_COMMUNITY): Payer: Medicare Other

## 2020-11-19 ENCOUNTER — Encounter: Payer: Self-pay | Admitting: Cardiovascular Disease

## 2020-11-19 ENCOUNTER — Ambulatory Visit (INDEPENDENT_AMBULATORY_CARE_PROVIDER_SITE_OTHER): Payer: Medicare Other | Admitting: Cardiovascular Disease

## 2020-11-19 ENCOUNTER — Other Ambulatory Visit: Payer: Self-pay

## 2020-11-19 VITALS — BP 148/70 | HR 63 | Ht 65.5 in | Wt 176.0 lb

## 2020-11-19 DIAGNOSIS — I5032 Chronic diastolic (congestive) heart failure: Secondary | ICD-10-CM | POA: Diagnosis not present

## 2020-11-19 DIAGNOSIS — N1832 Chronic kidney disease, stage 3b: Secondary | ICD-10-CM

## 2020-11-19 DIAGNOSIS — I495 Sick sinus syndrome: Secondary | ICD-10-CM

## 2020-11-19 DIAGNOSIS — Z950 Presence of cardiac pacemaker: Secondary | ICD-10-CM

## 2020-11-19 DIAGNOSIS — I2510 Atherosclerotic heart disease of native coronary artery without angina pectoris: Secondary | ICD-10-CM

## 2020-11-19 DIAGNOSIS — I10 Essential (primary) hypertension: Secondary | ICD-10-CM | POA: Diagnosis not present

## 2020-11-19 DIAGNOSIS — I6522 Occlusion and stenosis of left carotid artery: Secondary | ICD-10-CM | POA: Diagnosis not present

## 2020-11-19 DIAGNOSIS — I480 Paroxysmal atrial fibrillation: Secondary | ICD-10-CM

## 2020-11-19 DIAGNOSIS — Z7901 Long term (current) use of anticoagulants: Secondary | ICD-10-CM | POA: Diagnosis not present

## 2020-11-19 DIAGNOSIS — E7801 Familial hypercholesterolemia: Secondary | ICD-10-CM

## 2020-11-19 DIAGNOSIS — R0602 Shortness of breath: Secondary | ICD-10-CM

## 2020-11-19 LAB — PACEMAKER DEVICE OBSERVATION

## 2020-11-19 MED ORDER — POTASSIUM CHLORIDE ER 10 MEQ PO TBCR
20.0000 meq | EXTENDED_RELEASE_TABLET | Freq: Every day | ORAL | 3 refills | Status: DC
Start: 2020-11-19 — End: 2021-02-15

## 2020-11-19 MED ORDER — FUROSEMIDE 40 MG PO TABS
40.0000 mg | ORAL_TABLET | Freq: Every day | ORAL | 3 refills | Status: DC
Start: 2020-11-19 — End: 2021-02-15

## 2020-11-19 MED ORDER — FUROSEMIDE 40 MG PO TABS
80.0000 mg | ORAL_TABLET | Freq: Every day | ORAL | 2 refills | Status: DC
Start: 2020-11-19 — End: 2020-11-19

## 2020-11-19 NOTE — Progress Notes (Signed)
Cardiology Office Note    Date:  11/19/2020   ID:  Amy [name], DOB [birth year], MRN [id number]  PCP:  Celene Squibb, MD  Cardiologist:  Sanda Klein, MD   Electrophysiologist:  None   Evaluation Performed:  Follow-Up Visit  Chief Complaint:  CAD, AFib, PM, HLP  History of Present Illness:    Amy [name] is a 87 y.o. female with history of coronary artery disease and previous bypass surgery as well as sinus node dysfunction and persistent atrial arrhythmia, history of left inferior hemianopsia due to retinal artery occlusion.  She underwent a pacemaker generator change out in 2019 (Medtronic azure XT).  She has stage III chronic kidney disease and moderate carotid stenosis.  She has severe mixed hyperlipidemia intolerant of statins, on Repatha.  Fairly suddenly last November she had onset of shortness of breath with minimal activity. She did not want to go to the emergency room. Her daughter has been keeping tabs on her blood pressure and it is notable that during the period of time her systolic blood pressure was low, mostly in the 90s. After about a week these blood pressures returned to previous baseline of 120s/50-60, but the dyspnea has persisted. She becomes exceedingly short of breath simply walking to the bathroom and now uses a bedside commode. Unable to help with dishwashing or any other household chores. NYHA functional class III. She has not had dependent edema, although she did have transient swelling of her right ankle which was also tender, a few weeks ago. No calf swelling or tenderness. Intermittent throbbing uncomfortable sensation in both legs, from the knee up on the left and the entire right lower extremity. Denies cough, hemoptysis, wheezing, has not had any fever or chills. No clear orthopnea or PND.  She has not had any chest discomfort or pressure, although she distinctly remembers having angina when she had a remote myocardial infarction. Denies palpitations,  syncope, new focal neurological or visual complaints.  Pacemaker interrogation shows normal generator and lead parameters (estimated battery longevity 11 years, Medtronic Azure generator change in 2019). She is not pacemaker dependent, has 50% atrial pacing and only 0.1% ventricular pacing. The burden of atrial fibrillation remains low at 0.1%. There was no evidence of significant arrhythmia November. She had 2 days with increased arrhythmia on December 23 and December 26 with frequent short episodes, cumulatively only about 7 hours total. Ventricular rate control was good.There have been no episodes of high ventricular rate. There has not really been any change in overall activity level compared to before the onset of symptoms in November, only active about 0.7 hours a day.  Most recent labs from Dr. Juel Burrow office from early December showed a creatinine of 1.48, pretty much her baseline. Most recent lipid profile from November 3 shows a total cholesterol 213, HDL 53, triglycerides 162. Calculated LDL on 127; before treatment with Repatha her baseline LDL was about 260.  She reports that she is only been taking furosemide 20 mg once daily (half of the 40 mg pill), even though our medicine list showed 40 mg daily.  In 2009 she had an acute inferior wall ST segment elevation myocardial infarction treated with urgent angioplasty and placement of a bare-metal stent. Subsequently, she underwent multivessel bypass surgery with LIMA to LAD, SVG to ramus SVG to RCA. She has no evidence of residual myocardial injury and has preserved left ventricular systolic function. In June 2015, cardiac catheterization showed 90% proximal and mid LAD with patent distal  LIMA bypass; 40% ostial circumflex, not bypassed; unchanged 80% proximal ramus intermedius, SVG to ramus occluded; patent stent right coronary artery, SVG to right coronary artery occluded. Her LVEF was 55%, there was no significant mitral regurgitation and the  LVEDP was high normal at 15 mm Hg, (when she was short of breath). [name] stated that her dyspnea is likely multifactorial and cannot be explained completely by cardiac findings. In May 2016 she had a low risk nuclear study.  In 2010 a permanent pacemaker (Medtronic) was implanted for symptomatic bradycardia.  She underwent a generator change out in March 2019.  In late 2013 she had a fall complicated by a hip fracture and radius fracture. She had an esophageal perforation complicated by mediastinitis and had a feeding tube for several months. In October 2015 she had another fall in her home (no syncope, tripped at [facility].carpet transition) and fractured her pelvis and humerus.   She has fairly severe hyperlipidemia with an LDL cholesterol in excess of 200. She is statin, niacin, ezetimibe and resin intolerant. She takes Repatha.  She has systolic HTN, but also a tendency to marked orthostatic hypotension (25 mm Hg drop in SBP).  She has moderate CKD, baseline GFR around 30 mL/minute, has seen Dr. Lowanda Foster in the past, but does not have regular nephrology follow-up.  July 2019 she had an episode of unilateral inferior hemianopsia.  Brief episodes of atrial fibrillation were seen around that time.  There was also a moderate left internal carotid artery stenosis.  She had persistent atrial fibrillation from late July until November 2019.     Past Medical History:  Diagnosis Date  . Anxiety   . Arthritis   . Asthma   . CAD in native artery    s/p CABG x 3; Last Myoview in 07/2009 - non-ischemic; Echo 03/2012  Aortic Sclerosis with normal EF.  . CKD (chronic kidney disease), stage III ([place])   . DVT (deep vein thrombosis) in pregnancy   . Esophageal perforation    9/13  . Fall 07/21/2014   FALL WITH INJURY  . Fracture of head of humerus   . Fracture of hip ([place]) 07/21/2014   LEFT  . GERD (gastroesophageal reflux disease)   . Hypercholesteremia   . Hypertension   .  Hypothyroidism   . Loss of vision 05/22/2018   LEFT EYE  . Mediastinitis    s/p drainage  . Myocardial infarction [facility])    Inferior STEMI 07/2008 - PCI of prox RCA followed byu CABG x 3 in 9/'09  . Pacemaker   . PAF (paroxysmal atrial fibrillation) ([place])   . Paroxysmal atrial flutter ([place]) 11/26/2014  . Pneumonia   . Shortness of breath   . SSS (sick sinus syndrome) ([place]) 11/26/2014  . Status post hip hemiarthroplasty left hip by Dr Ronnie Derby 08/08/2012   Past Surgical History:  Procedure Laterality Date  . CARDIOVERSION N/A 09/21/2018   Procedure: CARDIOVERSION;  Surgeon: Sanda Klein, MD;  Location: [place] [facility];  Service: Cardiovascular;  Laterality: N/A;  . CARPAL TUNNEL RELEASE  08/08/2012   Procedure: CARPAL TUNNEL RELEASE;  Surgeon: Schuyler Amor, MD;  Location: [place];  Service: Orthopedics;  Laterality: Left;  . CORONARY ARTERY BYPASS GRAFT  2009   LIMA-LAD, SVG-RI, SVG-stented RCA  . ESOPHAGOGASTRODUODENOSCOPY  08/10/2012   Procedure: ESOPHAGOGASTRODUODENOSCOPY (EGD);  Surgeon: Beryle Beams, MD;  Location: [facility] ENDOSCOPY;  Service: Endoscopy;  Laterality: N/A;  . ESOPHAGOSCOPY  08/10/2012   Procedure: ESOPHAGOSCOPY;  Surgeon: [name], MD;  Location: [place] OR;  Service: ENT;  Laterality: N/A;  . GASTROSTOMY  08/16/2012   Procedure: GASTROSTOMY;  Surgeon: Zenovia Jarred, MD;  Location: [place];  Service: General;  Laterality: N/A;  . HIP ARTHROPLASTY  08/08/2012   Procedure: ARTHROPLASTY BIPOLAR HIP;  Surgeon: Rudean Haskell, MD;  Location: [place];  Service: Orthopedics;  Laterality: Left;  [place]   . JOINT REPLACEMENT    . LEFT HEART CATHETERIZATION WITH CORONARY ANGIOGRAM N/A 04/16/2014   Procedure: LEFT HEART CATHETERIZATION WITH CORONARY ANGIOGRAM;  Surgeon: Wellington Hampshire, MD;  Location: [place] CATH LAB;  Service: Cardiovascular;  Laterality: N/A;  . NM MYOCAR PERF WALL MOTION  08/14/2009   Normal  . PACEMAKER INSERTION  01/06/2009   Medtronic  . PPM GENERATOR CHANGEOUT  N/A 01/24/2018   Procedure: PPM GENERATOR CHANGEOUT;  Surgeon: Sanda Klein, MD;  Location: [place] CV LAB;  Service: Cardiovascular;  Laterality: N/A;  . TOTAL HIP ARTHROPLASTY    . WRIST FRACTURE SURGERY  07/2012   left intra articular  with carpel tunnel     Current Meds  Medication Sig  . acetaminophen (TYLENOL) 500 MG tablet Take 1,000 mg by mouth every 6 (six) hours as needed for moderate pain.  Marland Kitchen albuterol (PROVENTIL HFA;VENTOLIN HFA) 108 (90 Base) MCG/ACT inhaler Inhale 2 puffs into the lungs every 6 (six) hours as needed for wheezing or shortness of breath.   . allopurinol (ZYLOPRIM) 100 MG tablet Take 100 mg by mouth daily.  . calcium carbonate (TUMS - DOSED IN MG ELEMENTAL CALCIUM) 500 MG chewable tablet Chew 2 tablets by mouth daily as needed for indigestion or heartburn.  . Cholecalciferol (VITAMIN D3 PO) Take 1 tablet by mouth daily.  . cycloSPORINE (RESTASIS) 0.05 % ophthalmic emulsion Place 1 drop into both eyes 2 (two) times daily.  Marland Kitchen docusate sodium (COLACE) 100 MG capsule Take 100 mg by mouth 2 (two) times daily.  Marland Kitchen ELIQUIS 2.5 MG TABS tablet Take 1 tablet by mouth twice daily  . Fluticasone-Umeclidin-Vilant 100-62.5-25 MCG/INH AEPB Inhale 1 puff into the lungs daily.   Marland Kitchen gabapentin (NEURONTIN) 100 MG capsule Take 1 capsule (100 mg total) by mouth 2 (two) times daily.  Marland Kitchen levothyroxine (SYNTHROID, LEVOTHROID) 50 MCG tablet Take 50 mcg by mouth daily before breakfast.   . loratadine (CLARITIN) 10 MG tablet Take 10 mg by mouth daily as needed for allergies.  . metoprolol succinate (TOPROL-XL) 25 MG 24 hr tablet Take 1 tablet by mouth once daily  . Misc Natural Products (TART CHERRY ADVANCED PO) Take 1 tablet by mouth daily.  . nitroGLYCERIN (NITROSTAT) 0.4 MG SL tablet Place 1 tablet (0.4 mg total) under the tongue every 5 (five) minutes x 3 doses as needed for chest pain.  Marland Kitchen omeprazole (PRILOSEC) 20 MG capsule Take 1 capsule (20 mg total) by mouth 2 (two) times daily  before a meal.  . ondansetron (ZOFRAN) 4 MG tablet Take 1 tablet by mouth every 8 (eight) hours as needed for nausea or vomiting.   . Probiotic CAPS Take 1 capsule by mouth daily.  Marland Kitchen REPATHA SURECLICK 505 MG/ML SOAJ Inject 140 mg as directed every 14 (fourteen) days.   . [DISCONTINUED] furosemide (LASIX) 40 MG tablet TAKE 1 TABLET BY MOUTH ONCE DAILY AS DIRECTED  . [DISCONTINUED] potassium chloride (KLOR-CON) 10 MEQ tablet Take 1 tablet by mouth once daily     Allergies:   Colestipol, Hydrocodone, Niacin and related, Statins, and Zetia [ezetimibe]   Social History   Tobacco  Use  . Smoking status: Never Smoker  . Smokeless tobacco: Never Used  Vaping Use  . Vaping Use: Never used  Substance Use Topics  . Alcohol use: No  . Drug use: No     Family Hx: The patient's family history includes Stroke in her father.  ROS:   Please see the history of present illness.    All other systems are reviewed and are negative.   Prior CV studies:   The following studies were reviewed today: Comprehensive remote pacemaker check today  Labs/Other Tests and Data Reviewed:    EKG: Ordered today, personally reviewed, unchanged from previous tracings, shows atrial paced, ventricular sensed rhythm with prolonged AV delay 274 ms, with a QS pattern in leads V1-V2, QTC 411 ms  Recent Labs: No results found for requested labs within last 8760 hours.   Recent Lipid Panel Lab Results  Component Value Date/Time   CHOL 193 08/13/2018 09:55 AM   TRIG 153 (H) 08/13/2018 09:55 AM   HDL 51 08/13/2018 09:55 AM   CHOLHDL 3.8 08/13/2018 09:55 AM   CHOLHDL 6.0 05/23/2018 08:56 AM   LDLCALC 111 (H) 08/13/2018 09:55 AM   Labs from 02/25/2019 show total cholesterol 192, HDL 60, LDL low not reported, triglycerides 175, hemoglobin A1c 5.9%, hemoglobin 13, creatinine 1.57, ALT 11, TSH 3.00  03/04/2020 creatinine 1.58, which is pretty much her baseline.   Hemoglobin A1c 6% Hemoglobin 12.4 TSH 4.42 Normal  liver function tests total cholesterol 188, HDL 52, triglycerides 231  09/02/2020 Total cholesterol 213, HDL 53, triglycerides 62, calculated LDL 127, hemoglobin A1c 6.3%  10/01/2020 Hemoglobin 12.8, creatinine 1.48, normal liver function test, TSH 2.08  Wt Readings from Last 3 Encounters:  11/19/20 176 lb (79.8 kg)  09/26/20 170 lb (77.1 kg)  05/21/20 172 lb (78 kg)     Objective:    Vital Signs:  BP (!) 148/70 (BP Location: Right Arm, Patient Position: Sitting, Cuff Size: Normal)   Pulse 63   Ht 5' 5.5" (1.664 m)   Wt 176 lb (79.8 kg)   BMI 28.84 kg/m     General: Alert, oriented x3, no distress, appears younger than stated age Head: no evidence of trauma, PERRL, EOMI, no exophtalmos or lid lag, no myxedema, no xanthelasma; normal ears, nose and oropharynx Neck: 4-5 cm elevation in jugular venous pulsations and prompt hepatojugular reflux; brisk carotid pulses without delay and no carotid bruits Chest: clear to auscultation, no signs of consolidation by percussion or palpation, normal fremitus, symmetrical and full respiratory excursions Cardiovascular: normal position and quality of the apical impulse, regular rhythm, normal first and second heart sounds, 2/6 aortic ejection murmur is early to mid peaking, no diastolic murmurs, rubs or gallops Abdomen: no tenderness or distention, no masses by palpation, no abnormal pulsatility or arterial bruits, normal bowel sounds, no hepatosplenomegaly Extremities: no clubbing, cyanosis; there is trivial symmetrical perimalleollar edema; 2+ radial, ulnar and brachial pulses bilaterally; 2+ right femoral, posterior tibial and dorsalis pedis pulses; 2+ left femoral, posterior tibial and dorsalis pedis pulses; no subclavian or femoral bruits Neurological: grossly nonfocal Psych: Normal mood and affect   ASSESSMENT & PLAN:    1. AFib: Infrequent recurrence since her cardioversion in November 2019. A total of 6-7 hours of paroxysmal atrial  fibrillation around Christmas, not clearly associated with her increased dyspnea which began in November. Always well rate controlled.  On appropriate anticoagulation, CHADSVasc 8 (age 65, CVA 2, gender, HF, PAD/CAD, HTN).  2. Eliquis: Dose adjusted for age and  kidney function. No serious bleeding complications. 3. CHF: Only very subtle signs of possible hypervolemia, functional status has deteriorated substantially. We will increase her diuretic and potassium dosage and recheck labs. Check BNP. 4. CAD s/p CABG: Denies angina. Onset of shortness of breath was fairly sudden in November but it is too late now to check cardiac enzymes. Check regional wall motion overall systolic and diastolic function by echo..  5. SSS: Normal device function. Normal heart rate histogram distribution. Arrhythmia does not explain her dyspnea. 6. PM: Continue remote downloads every 3 months. Original leads implanted 2010 slight excellent parameters. Anticipated generator longevity 11 years. 7. HTN: Has had severe weakness and dizziness due to marked orthostatic hypotension and particularly low diastolic blood pressure. Watch carefully for hypotension with increased diuretic dosage. 8. HLP: Severe cholesterol elevation with serious atherosclerotic disease, consistent with familial heterozygous hypercholesterolemia. LDL cholesterol remains elevated, but markedly improved compared to her baseline. Intolerant to statins/ezetimibe/resins but doing well on Repatha. 9. Left internal carotid artery stenosis: Medical management. No new neurological complaints. 10. CKD 3B: at baseline per recent labs. Monitor carefully with increased diuretics.  Patient Instructions  Medication Instructions:  INCREASE the Furosemide to 40 mg once daily INCREASE the Potassium to 20 mEq once daily (2 of the 10 mEq tablets)  *If you need a refill on your cardiac medications before your next appointment, please call your pharmacy*   Lab Work: Your  provider would like for you to have the following labs today: BMET and BNP  If you have labs (blood work) drawn today and your tests are completely normal, you will receive your results only by: Marland Kitchen MyChart Message (if you have MyChart) OR . A paper copy in the mail If you have any lab test that is abnormal or we need to change your treatment, we will call you to review the results.   Testing/Procedures: None ordered   Follow-Up: At [facility], you and your health needs are our priority.  As part of our continuing mission to provide you with exceptional heart care, we have created designated Provider Care Teams.  These Care Teams include your primary Cardiologist (physician) and Advanced Practice Providers (APPs -  Physician Assistants and Nurse Practitioners) who all work together to provide you with the care you need, when you need it.  We recommend signing up for the patient portal called "MyChart".  Sign up information is provided on this After Visit Summary.  MyChart is used to connect with patients for Virtual Visits (Telemedicine).  Patients are able to view lab/test results, encounter notes, upcoming appointments, etc.  Non-urgent messages can be sent to your provider as well.   To learn more about what you can do with MyChart, go to [email or web address].    Your next appointment:   2 month(s)  The format for your next appointment:   In Person  Provider:   You may see Sanda Klein, MD or one of the following Advanced Practice Providers on your designated Care Team:    Almyra Deforest, PA-C  Fabian Sharp, [place] or   Roby Lofts, PA-C      Signed, Sanda Klein, MD  11/19/2020 11:14 AM    [place]

## 2020-11-19 NOTE — Patient Instructions (Addendum)
Medication Instructions:  INCREASE the Furosemide to 40 mg once daily INCREASE the Potassium to 20 mEq once daily (2 of the 10 mEq tablets)  *If you need a refill on your cardiac medications before your next appointment, please call your pharmacy*   Lab Work: Your provider would like for you to have the following labs today: BMET and BNP  If you have labs (blood work) drawn today and your tests are completely normal, you will receive your results only by: Marland Kitchen MyChart Message (if you have MyChart) OR . A paper copy in the mail If you have any lab test that is abnormal or we need to change your treatment, we will call you to review the results.   Testing/Procedures: None ordered   Follow-Up: At [facility], you and your health needs are our priority.  As part of our continuing mission to provide you with exceptional heart care, we have created designated Provider Care Teams.  These Care Teams include your primary Cardiologist (physician) and Advanced Practice Providers (APPs -  Physician Assistants and Nurse Practitioners) who all work together to provide you with the care you need, when you need it.  We recommend signing up for the patient portal called "MyChart".  Sign up information is provided on this After Visit Summary.  MyChart is used to connect with patients for Virtual Visits (Telemedicine).  Patients are able to view lab/test results, encounter notes, upcoming appointments, etc.  Non-urgent messages can be sent to your provider as well.   To learn more about what you can do with MyChart, go to [email or web address].    Your next appointment:   2 month(s)  The format for your next appointment:   In Person  Provider:   You may see Sanda Klein, MD or one of the following Advanced Practice Providers on your designated Care Team:    Almyra Deforest, PA-C  Fabian Sharp, PA-C or   Roby Lofts, [place]

## 2020-11-20 ENCOUNTER — Ambulatory Visit (HOSPITAL_COMMUNITY): Payer: Medicare Other | Attending: Cardiology

## 2020-11-20 ENCOUNTER — Other Ambulatory Visit: Payer: Self-pay | Admitting: *Deleted

## 2020-11-20 DIAGNOSIS — R0602 Shortness of breath: Secondary | ICD-10-CM

## 2020-11-20 LAB — BASIC METABOLIC PANEL
BUN/Creatinine Ratio: 19 (ref 12–28)
BUN: 28 mg/dL (ref 10–36)
CO2: 26 mmol/L (ref 20–29)
Calcium: 10 mg/dL (ref 8.7–10.3)
Chloride: 103 mmol/L (ref 96–106)
Creatinine, Ser: 1.51 mg/dL — ABNORMAL HIGH (ref 0.57–1.00)
GFR calc Af Amer: 33 mL/min/{1.73_m2} — ABNORMAL LOW (ref >59)
GFR calc non Af Amer: 29 mL/min/{1.73_m2} — ABNORMAL LOW (ref >59)
Glucose: 94 mg/dL (ref 65–99)
Potassium: 4.7 mmol/L (ref 3.5–5.2)
Sodium: 144 mmol/L (ref 134–144)

## 2020-11-20 LAB — ECHOCARDIOGRAM COMPLETE
AR max vel: 1.32 cm2
AV Area VTI: 1.38 cm2
AV Area mean vel: 1.44 cm2
AV Mean grad: 9 mmHg
AV Peak grad: 16.6 mmHg
Ao pk vel: 2.04 m/s
Area-P 1/2: 2.52 cm2
S' Lateral: 2.5 cm

## 2020-11-20 LAB — BRAIN NATRIURETIC PEPTIDE: BNP: 171 pg/mL — ABNORMAL HIGH (ref 0.0–100.0)

## 2020-11-24 ENCOUNTER — Other Ambulatory Visit: Payer: Self-pay

## 2020-11-24 ENCOUNTER — Ambulatory Visit (HOSPITAL_COMMUNITY)
Admission: RE | Admit: 2020-11-24 | Discharge: 2020-11-24 | Disposition: A | Discharge status: Home / Self Care | Payer: Medicare Other | Source: Ambulatory Visit | Attending: Cardiovascular Disease | Admitting: Cardiovascular Disease

## 2020-11-24 DIAGNOSIS — R0602 Shortness of breath: Secondary | ICD-10-CM | POA: Diagnosis not present

## 2020-11-26 ENCOUNTER — Telehealth: Payer: Self-pay | Admitting: Cardiovascular Disease

## 2020-11-26 NOTE — Telephone Encounter (Signed)
New Message:    Please have Dr C nurse to call her please. She forgot to tell them something last week when she had her office visit.  It was about her medicine and she have a question.

## 2020-11-26 NOTE — Progress Notes (Signed)
Remote pacemaker transmission.   

## 2020-11-26 NOTE — Telephone Encounter (Signed)
Spoke to patient's daughter, ok per DPR. Patient saw Dr. Sallyanne Kuster last week on 11/19/20. She forgot to mention that PCP increased patient's levothyroxine to 100 mcg daily around Nov 2021. She wanted to make him aware in case that could have anything to do with patient's shortness of breath. Advised that I did not think it would make an impact but would route to Dr Sallyanne Kuster to make him aware.   She also mentioned that they discussed patient may need a referral to physical therapy to help her strengthen her muscles. Advised to call PCP about possible referral to PT. She was very [employer].

## 2020-11-27 NOTE — Telephone Encounter (Signed)
Noted-thank you. Medication updated in epic.

## 2020-11-28 DIAGNOSIS — R600 Localized edema: Secondary | ICD-10-CM | POA: Diagnosis not present

## 2020-11-28 DIAGNOSIS — K219 Gastro-esophageal reflux disease without esophagitis: Secondary | ICD-10-CM | POA: Diagnosis not present

## 2020-11-28 DIAGNOSIS — I10 Essential (primary) hypertension: Secondary | ICD-10-CM | POA: Diagnosis not present

## 2020-11-28 DIAGNOSIS — M25571 Pain in right ankle and joints of right foot: Secondary | ICD-10-CM | POA: Diagnosis not present

## 2020-11-28 DIAGNOSIS — N390 Urinary tract infection, site not specified: Secondary | ICD-10-CM | POA: Diagnosis not present

## 2020-11-28 DIAGNOSIS — E782 Mixed hyperlipidemia: Secondary | ICD-10-CM | POA: Diagnosis not present

## 2020-11-28 DIAGNOSIS — R0602 Shortness of breath: Secondary | ICD-10-CM | POA: Diagnosis not present

## 2020-11-28 DIAGNOSIS — E039 Hypothyroidism, unspecified: Secondary | ICD-10-CM | POA: Diagnosis not present

## 2020-12-03 ENCOUNTER — Other Ambulatory Visit (HOSPITAL_COMMUNITY): Payer: Medicare Other

## 2020-12-15 DIAGNOSIS — Z23 Encounter for immunization: Secondary | ICD-10-CM | POA: Diagnosis not present

## 2020-12-21 DIAGNOSIS — R001 Bradycardia, unspecified: Secondary | ICD-10-CM | POA: Diagnosis not present

## 2020-12-21 DIAGNOSIS — H53133 Sudden visual loss, bilateral: Secondary | ICD-10-CM | POA: Diagnosis not present

## 2020-12-21 DIAGNOSIS — M6281 Muscle weakness (generalized): Secondary | ICD-10-CM | POA: Diagnosis not present

## 2020-12-21 DIAGNOSIS — J069 Acute upper respiratory infection, unspecified: Secondary | ICD-10-CM | POA: Diagnosis not present

## 2020-12-21 DIAGNOSIS — L989 Disorder of the skin and subcutaneous tissue, unspecified: Secondary | ICD-10-CM | POA: Diagnosis not present

## 2020-12-21 DIAGNOSIS — E7800 Pure hypercholesterolemia, unspecified: Secondary | ICD-10-CM | POA: Diagnosis not present

## 2020-12-21 DIAGNOSIS — J302 Other seasonal allergic rhinitis: Secondary | ICD-10-CM | POA: Diagnosis not present

## 2020-12-21 DIAGNOSIS — R059 Cough, unspecified: Secondary | ICD-10-CM | POA: Diagnosis not present

## 2020-12-21 DIAGNOSIS — I2510 Atherosclerotic heart disease of native coronary artery without angina pectoris: Secondary | ICD-10-CM | POA: Diagnosis not present

## 2020-12-21 DIAGNOSIS — N184 Chronic kidney disease, stage 4 (severe): Secondary | ICD-10-CM | POA: Diagnosis not present

## 2020-12-21 DIAGNOSIS — I5030 Unspecified diastolic (congestive) heart failure: Secondary | ICD-10-CM | POA: Diagnosis not present

## 2020-12-21 DIAGNOSIS — Z09 Encounter for follow-up examination after completed treatment for conditions other than malignant neoplasm: Secondary | ICD-10-CM | POA: Diagnosis not present

## 2020-12-24 DIAGNOSIS — I6529 Occlusion and stenosis of unspecified carotid artery: Secondary | ICD-10-CM | POA: Diagnosis not present

## 2020-12-24 DIAGNOSIS — N184 Chronic kidney disease, stage 4 (severe): Secondary | ICD-10-CM | POA: Diagnosis not present

## 2020-12-24 DIAGNOSIS — J449 Chronic obstructive pulmonary disease, unspecified: Secondary | ICD-10-CM | POA: Diagnosis not present

## 2020-12-24 DIAGNOSIS — I480 Paroxysmal atrial fibrillation: Secondary | ICD-10-CM | POA: Diagnosis not present

## 2020-12-24 DIAGNOSIS — H5340 Unspecified visual field defects: Secondary | ICD-10-CM | POA: Diagnosis not present

## 2020-12-24 DIAGNOSIS — E790 Hyperuricemia without signs of inflammatory arthritis and tophaceous disease: Secondary | ICD-10-CM | POA: Diagnosis not present

## 2020-12-24 DIAGNOSIS — R7301 Impaired fasting glucose: Secondary | ICD-10-CM | POA: Diagnosis not present

## 2020-12-24 DIAGNOSIS — E7800 Pure hypercholesterolemia, unspecified: Secondary | ICD-10-CM | POA: Diagnosis not present

## 2020-12-24 DIAGNOSIS — I5032 Chronic diastolic (congestive) heart failure: Secondary | ICD-10-CM | POA: Diagnosis not present

## 2020-12-24 DIAGNOSIS — R32 Unspecified urinary incontinence: Secondary | ICD-10-CM | POA: Diagnosis not present

## 2020-12-24 DIAGNOSIS — I10 Essential (primary) hypertension: Secondary | ICD-10-CM | POA: Diagnosis not present

## 2020-12-24 DIAGNOSIS — I2510 Atherosclerotic heart disease of native coronary artery without angina pectoris: Secondary | ICD-10-CM | POA: Diagnosis not present

## 2021-01-04 DIAGNOSIS — E039 Hypothyroidism, unspecified: Secondary | ICD-10-CM | POA: Diagnosis not present

## 2021-01-04 DIAGNOSIS — K5900 Constipation, unspecified: Secondary | ICD-10-CM | POA: Diagnosis not present

## 2021-01-04 DIAGNOSIS — M19072 Primary osteoarthritis, left ankle and foot: Secondary | ICD-10-CM | POA: Diagnosis not present

## 2021-01-04 DIAGNOSIS — I2510 Atherosclerotic heart disease of native coronary artery without angina pectoris: Secondary | ICD-10-CM | POA: Diagnosis not present

## 2021-01-04 DIAGNOSIS — G2581 Restless legs syndrome: Secondary | ICD-10-CM | POA: Diagnosis not present

## 2021-01-04 DIAGNOSIS — I252 Old myocardial infarction: Secondary | ICD-10-CM | POA: Diagnosis not present

## 2021-01-04 DIAGNOSIS — I5032 Chronic diastolic (congestive) heart failure: Secondary | ICD-10-CM | POA: Diagnosis not present

## 2021-01-04 DIAGNOSIS — J449 Chronic obstructive pulmonary disease, unspecified: Secondary | ICD-10-CM | POA: Diagnosis not present

## 2021-01-04 DIAGNOSIS — I130 Hypertensive heart and chronic kidney disease with heart failure and stage 1 through stage 4 chronic kidney disease, or unspecified chronic kidney disease: Secondary | ICD-10-CM | POA: Diagnosis not present

## 2021-01-04 DIAGNOSIS — E785 Hyperlipidemia, unspecified: Secondary | ICD-10-CM | POA: Diagnosis not present

## 2021-01-04 DIAGNOSIS — E7800 Pure hypercholesterolemia, unspecified: Secondary | ICD-10-CM | POA: Diagnosis not present

## 2021-01-04 DIAGNOSIS — G4700 Insomnia, unspecified: Secondary | ICD-10-CM | POA: Diagnosis not present

## 2021-01-04 DIAGNOSIS — I480 Paroxysmal atrial fibrillation: Secondary | ICD-10-CM | POA: Diagnosis not present

## 2021-01-04 DIAGNOSIS — I4892 Unspecified atrial flutter: Secondary | ICD-10-CM | POA: Diagnosis not present

## 2021-01-04 DIAGNOSIS — Z9181 History of falling: Secondary | ICD-10-CM | POA: Diagnosis not present

## 2021-01-04 DIAGNOSIS — Z7901 Long term (current) use of anticoagulants: Secondary | ICD-10-CM | POA: Diagnosis not present

## 2021-01-04 DIAGNOSIS — M19071 Primary osteoarthritis, right ankle and foot: Secondary | ICD-10-CM | POA: Diagnosis not present

## 2021-01-04 DIAGNOSIS — M10372 Gout due to renal impairment, left ankle and foot: Secondary | ICD-10-CM | POA: Diagnosis not present

## 2021-01-04 DIAGNOSIS — Z7951 Long term (current) use of inhaled steroids: Secondary | ICD-10-CM | POA: Diagnosis not present

## 2021-01-04 DIAGNOSIS — H53139 Sudden visual loss, unspecified eye: Secondary | ICD-10-CM | POA: Diagnosis not present

## 2021-01-04 DIAGNOSIS — R7301 Impaired fasting glucose: Secondary | ICD-10-CM | POA: Diagnosis not present

## 2021-01-04 DIAGNOSIS — K219 Gastro-esophageal reflux disease without esophagitis: Secondary | ICD-10-CM | POA: Diagnosis not present

## 2021-01-04 DIAGNOSIS — J302 Other seasonal allergic rhinitis: Secondary | ICD-10-CM | POA: Diagnosis not present

## 2021-01-04 DIAGNOSIS — F419 Anxiety disorder, unspecified: Secondary | ICD-10-CM | POA: Diagnosis not present

## 2021-01-04 DIAGNOSIS — N184 Chronic kidney disease, stage 4 (severe): Secondary | ICD-10-CM | POA: Diagnosis not present

## 2021-01-08 DIAGNOSIS — M10372 Gout due to renal impairment, left ankle and foot: Secondary | ICD-10-CM | POA: Diagnosis not present

## 2021-01-08 DIAGNOSIS — J449 Chronic obstructive pulmonary disease, unspecified: Secondary | ICD-10-CM | POA: Diagnosis not present

## 2021-01-08 DIAGNOSIS — N184 Chronic kidney disease, stage 4 (severe): Secondary | ICD-10-CM | POA: Diagnosis not present

## 2021-01-08 DIAGNOSIS — M19072 Primary osteoarthritis, left ankle and foot: Secondary | ICD-10-CM | POA: Diagnosis not present

## 2021-01-08 DIAGNOSIS — I130 Hypertensive heart and chronic kidney disease with heart failure and stage 1 through stage 4 chronic kidney disease, or unspecified chronic kidney disease: Secondary | ICD-10-CM | POA: Diagnosis not present

## 2021-01-08 DIAGNOSIS — I5032 Chronic diastolic (congestive) heart failure: Secondary | ICD-10-CM | POA: Diagnosis not present

## 2021-01-11 DIAGNOSIS — I5032 Chronic diastolic (congestive) heart failure: Secondary | ICD-10-CM | POA: Diagnosis not present

## 2021-01-11 DIAGNOSIS — I130 Hypertensive heart and chronic kidney disease with heart failure and stage 1 through stage 4 chronic kidney disease, or unspecified chronic kidney disease: Secondary | ICD-10-CM | POA: Diagnosis not present

## 2021-01-11 DIAGNOSIS — M10372 Gout due to renal impairment, left ankle and foot: Secondary | ICD-10-CM | POA: Diagnosis not present

## 2021-01-11 DIAGNOSIS — M19072 Primary osteoarthritis, left ankle and foot: Secondary | ICD-10-CM | POA: Diagnosis not present

## 2021-01-11 DIAGNOSIS — J449 Chronic obstructive pulmonary disease, unspecified: Secondary | ICD-10-CM | POA: Diagnosis not present

## 2021-01-11 DIAGNOSIS — N184 Chronic kidney disease, stage 4 (severe): Secondary | ICD-10-CM | POA: Diagnosis not present

## 2021-01-14 DIAGNOSIS — M10372 Gout due to renal impairment, left ankle and foot: Secondary | ICD-10-CM | POA: Diagnosis not present

## 2021-01-14 DIAGNOSIS — I5032 Chronic diastolic (congestive) heart failure: Secondary | ICD-10-CM | POA: Diagnosis not present

## 2021-01-14 DIAGNOSIS — I130 Hypertensive heart and chronic kidney disease with heart failure and stage 1 through stage 4 chronic kidney disease, or unspecified chronic kidney disease: Secondary | ICD-10-CM | POA: Diagnosis not present

## 2021-01-14 DIAGNOSIS — M19072 Primary osteoarthritis, left ankle and foot: Secondary | ICD-10-CM | POA: Diagnosis not present

## 2021-01-14 DIAGNOSIS — N184 Chronic kidney disease, stage 4 (severe): Secondary | ICD-10-CM | POA: Diagnosis not present

## 2021-01-14 DIAGNOSIS — J449 Chronic obstructive pulmonary disease, unspecified: Secondary | ICD-10-CM | POA: Diagnosis not present

## 2021-01-18 DIAGNOSIS — G4700 Insomnia, unspecified: Secondary | ICD-10-CM | POA: Diagnosis not present

## 2021-01-18 DIAGNOSIS — I480 Paroxysmal atrial fibrillation: Secondary | ICD-10-CM | POA: Diagnosis not present

## 2021-01-18 DIAGNOSIS — J069 Acute upper respiratory infection, unspecified: Secondary | ICD-10-CM | POA: Diagnosis not present

## 2021-01-18 DIAGNOSIS — J302 Other seasonal allergic rhinitis: Secondary | ICD-10-CM | POA: Diagnosis not present

## 2021-01-18 DIAGNOSIS — I2510 Atherosclerotic heart disease of native coronary artery without angina pectoris: Secondary | ICD-10-CM | POA: Diagnosis not present

## 2021-01-18 DIAGNOSIS — R7301 Impaired fasting glucose: Secondary | ICD-10-CM | POA: Diagnosis not present

## 2021-01-18 DIAGNOSIS — J449 Chronic obstructive pulmonary disease, unspecified: Secondary | ICD-10-CM | POA: Diagnosis not present

## 2021-01-18 DIAGNOSIS — I5032 Chronic diastolic (congestive) heart failure: Secondary | ICD-10-CM | POA: Diagnosis not present

## 2021-01-18 DIAGNOSIS — R059 Cough, unspecified: Secondary | ICD-10-CM | POA: Diagnosis not present

## 2021-01-18 DIAGNOSIS — E7800 Pure hypercholesterolemia, unspecified: Secondary | ICD-10-CM | POA: Diagnosis not present

## 2021-01-18 DIAGNOSIS — I5030 Unspecified diastolic (congestive) heart failure: Secondary | ICD-10-CM | POA: Diagnosis not present

## 2021-01-18 DIAGNOSIS — L989 Disorder of the skin and subcutaneous tissue, unspecified: Secondary | ICD-10-CM | POA: Diagnosis not present

## 2021-01-18 DIAGNOSIS — H53133 Sudden visual loss, bilateral: Secondary | ICD-10-CM | POA: Diagnosis not present

## 2021-01-18 DIAGNOSIS — N184 Chronic kidney disease, stage 4 (severe): Secondary | ICD-10-CM | POA: Diagnosis not present

## 2021-01-18 DIAGNOSIS — I6529 Occlusion and stenosis of unspecified carotid artery: Secondary | ICD-10-CM | POA: Diagnosis not present

## 2021-01-18 DIAGNOSIS — H5340 Unspecified visual field defects: Secondary | ICD-10-CM | POA: Diagnosis not present

## 2021-01-18 DIAGNOSIS — Z09 Encounter for follow-up examination after completed treatment for conditions other than malignant neoplasm: Secondary | ICD-10-CM | POA: Diagnosis not present

## 2021-01-18 DIAGNOSIS — M6281 Muscle weakness (generalized): Secondary | ICD-10-CM | POA: Diagnosis not present

## 2021-01-18 DIAGNOSIS — E790 Hyperuricemia without signs of inflammatory arthritis and tophaceous disease: Secondary | ICD-10-CM | POA: Diagnosis not present

## 2021-01-18 DIAGNOSIS — R001 Bradycardia, unspecified: Secondary | ICD-10-CM | POA: Diagnosis not present

## 2021-01-18 DIAGNOSIS — I10 Essential (primary) hypertension: Secondary | ICD-10-CM | POA: Diagnosis not present

## 2021-01-19 DIAGNOSIS — I5032 Chronic diastolic (congestive) heart failure: Secondary | ICD-10-CM | POA: Diagnosis not present

## 2021-01-19 DIAGNOSIS — J449 Chronic obstructive pulmonary disease, unspecified: Secondary | ICD-10-CM | POA: Diagnosis not present

## 2021-01-19 DIAGNOSIS — M19072 Primary osteoarthritis, left ankle and foot: Secondary | ICD-10-CM | POA: Diagnosis not present

## 2021-01-19 DIAGNOSIS — N184 Chronic kidney disease, stage 4 (severe): Secondary | ICD-10-CM | POA: Diagnosis not present

## 2021-01-19 DIAGNOSIS — I130 Hypertensive heart and chronic kidney disease with heart failure and stage 1 through stage 4 chronic kidney disease, or unspecified chronic kidney disease: Secondary | ICD-10-CM | POA: Diagnosis not present

## 2021-01-19 DIAGNOSIS — M10372 Gout due to renal impairment, left ankle and foot: Secondary | ICD-10-CM | POA: Diagnosis not present

## 2021-01-21 DIAGNOSIS — I130 Hypertensive heart and chronic kidney disease with heart failure and stage 1 through stage 4 chronic kidney disease, or unspecified chronic kidney disease: Secondary | ICD-10-CM | POA: Diagnosis not present

## 2021-01-21 DIAGNOSIS — J449 Chronic obstructive pulmonary disease, unspecified: Secondary | ICD-10-CM | POA: Diagnosis not present

## 2021-01-21 DIAGNOSIS — I5032 Chronic diastolic (congestive) heart failure: Secondary | ICD-10-CM | POA: Diagnosis not present

## 2021-01-21 DIAGNOSIS — N184 Chronic kidney disease, stage 4 (severe): Secondary | ICD-10-CM | POA: Diagnosis not present

## 2021-01-21 DIAGNOSIS — M19072 Primary osteoarthritis, left ankle and foot: Secondary | ICD-10-CM | POA: Diagnosis not present

## 2021-01-21 DIAGNOSIS — M10372 Gout due to renal impairment, left ankle and foot: Secondary | ICD-10-CM | POA: Diagnosis not present

## 2021-01-25 DIAGNOSIS — N184 Chronic kidney disease, stage 4 (severe): Secondary | ICD-10-CM | POA: Diagnosis not present

## 2021-01-25 DIAGNOSIS — M10372 Gout due to renal impairment, left ankle and foot: Secondary | ICD-10-CM | POA: Diagnosis not present

## 2021-01-25 DIAGNOSIS — M19072 Primary osteoarthritis, left ankle and foot: Secondary | ICD-10-CM | POA: Diagnosis not present

## 2021-01-25 DIAGNOSIS — J449 Chronic obstructive pulmonary disease, unspecified: Secondary | ICD-10-CM | POA: Diagnosis not present

## 2021-01-25 DIAGNOSIS — I130 Hypertensive heart and chronic kidney disease with heart failure and stage 1 through stage 4 chronic kidney disease, or unspecified chronic kidney disease: Secondary | ICD-10-CM | POA: Diagnosis not present

## 2021-01-25 DIAGNOSIS — I5032 Chronic diastolic (congestive) heart failure: Secondary | ICD-10-CM | POA: Diagnosis not present

## 2021-01-27 DIAGNOSIS — E7800 Pure hypercholesterolemia, unspecified: Secondary | ICD-10-CM | POA: Diagnosis not present

## 2021-01-27 DIAGNOSIS — J449 Chronic obstructive pulmonary disease, unspecified: Secondary | ICD-10-CM | POA: Diagnosis not present

## 2021-01-27 DIAGNOSIS — I10 Essential (primary) hypertension: Secondary | ICD-10-CM | POA: Diagnosis not present

## 2021-01-27 DIAGNOSIS — I2510 Atherosclerotic heart disease of native coronary artery without angina pectoris: Secondary | ICD-10-CM | POA: Diagnosis not present

## 2021-01-27 DIAGNOSIS — I5032 Chronic diastolic (congestive) heart failure: Secondary | ICD-10-CM | POA: Diagnosis not present

## 2021-01-27 DIAGNOSIS — G4700 Insomnia, unspecified: Secondary | ICD-10-CM | POA: Diagnosis not present

## 2021-01-27 DIAGNOSIS — I480 Paroxysmal atrial fibrillation: Secondary | ICD-10-CM | POA: Diagnosis not present

## 2021-01-27 DIAGNOSIS — H5340 Unspecified visual field defects: Secondary | ICD-10-CM | POA: Diagnosis not present

## 2021-01-27 DIAGNOSIS — R7301 Impaired fasting glucose: Secondary | ICD-10-CM | POA: Diagnosis not present

## 2021-01-27 DIAGNOSIS — E790 Hyperuricemia without signs of inflammatory arthritis and tophaceous disease: Secondary | ICD-10-CM | POA: Diagnosis not present

## 2021-01-27 DIAGNOSIS — K5900 Constipation, unspecified: Secondary | ICD-10-CM | POA: Diagnosis not present

## 2021-01-27 DIAGNOSIS — I6529 Occlusion and stenosis of unspecified carotid artery: Secondary | ICD-10-CM | POA: Diagnosis not present

## 2021-01-28 DIAGNOSIS — J449 Chronic obstructive pulmonary disease, unspecified: Secondary | ICD-10-CM | POA: Diagnosis not present

## 2021-01-28 DIAGNOSIS — N184 Chronic kidney disease, stage 4 (severe): Secondary | ICD-10-CM | POA: Diagnosis not present

## 2021-01-28 DIAGNOSIS — M19072 Primary osteoarthritis, left ankle and foot: Secondary | ICD-10-CM | POA: Diagnosis not present

## 2021-01-28 DIAGNOSIS — I5032 Chronic diastolic (congestive) heart failure: Secondary | ICD-10-CM | POA: Diagnosis not present

## 2021-01-28 DIAGNOSIS — I130 Hypertensive heart and chronic kidney disease with heart failure and stage 1 through stage 4 chronic kidney disease, or unspecified chronic kidney disease: Secondary | ICD-10-CM | POA: Diagnosis not present

## 2021-01-28 DIAGNOSIS — M10372 Gout due to renal impairment, left ankle and foot: Secondary | ICD-10-CM | POA: Diagnosis not present

## 2021-02-03 DIAGNOSIS — G2581 Restless legs syndrome: Secondary | ICD-10-CM | POA: Diagnosis not present

## 2021-02-03 DIAGNOSIS — K5900 Constipation, unspecified: Secondary | ICD-10-CM | POA: Diagnosis not present

## 2021-02-03 DIAGNOSIS — I4892 Unspecified atrial flutter: Secondary | ICD-10-CM | POA: Diagnosis not present

## 2021-02-03 DIAGNOSIS — M19072 Primary osteoarthritis, left ankle and foot: Secondary | ICD-10-CM | POA: Diagnosis not present

## 2021-02-03 DIAGNOSIS — F419 Anxiety disorder, unspecified: Secondary | ICD-10-CM | POA: Diagnosis not present

## 2021-02-03 DIAGNOSIS — N184 Chronic kidney disease, stage 4 (severe): Secondary | ICD-10-CM | POA: Diagnosis not present

## 2021-02-03 DIAGNOSIS — I130 Hypertensive heart and chronic kidney disease with heart failure and stage 1 through stage 4 chronic kidney disease, or unspecified chronic kidney disease: Secondary | ICD-10-CM | POA: Diagnosis not present

## 2021-02-03 DIAGNOSIS — I5032 Chronic diastolic (congestive) heart failure: Secondary | ICD-10-CM | POA: Diagnosis not present

## 2021-02-03 DIAGNOSIS — Z7951 Long term (current) use of inhaled steroids: Secondary | ICD-10-CM | POA: Diagnosis not present

## 2021-02-03 DIAGNOSIS — Z9181 History of falling: Secondary | ICD-10-CM | POA: Diagnosis not present

## 2021-02-03 DIAGNOSIS — E7800 Pure hypercholesterolemia, unspecified: Secondary | ICD-10-CM | POA: Diagnosis not present

## 2021-02-03 DIAGNOSIS — H53139 Sudden visual loss, unspecified eye: Secondary | ICD-10-CM | POA: Diagnosis not present

## 2021-02-03 DIAGNOSIS — J302 Other seasonal allergic rhinitis: Secondary | ICD-10-CM | POA: Diagnosis not present

## 2021-02-03 DIAGNOSIS — M19071 Primary osteoarthritis, right ankle and foot: Secondary | ICD-10-CM | POA: Diagnosis not present

## 2021-02-03 DIAGNOSIS — R7301 Impaired fasting glucose: Secondary | ICD-10-CM | POA: Diagnosis not present

## 2021-02-03 DIAGNOSIS — E039 Hypothyroidism, unspecified: Secondary | ICD-10-CM | POA: Diagnosis not present

## 2021-02-03 DIAGNOSIS — I252 Old myocardial infarction: Secondary | ICD-10-CM | POA: Diagnosis not present

## 2021-02-03 DIAGNOSIS — I480 Paroxysmal atrial fibrillation: Secondary | ICD-10-CM | POA: Diagnosis not present

## 2021-02-03 DIAGNOSIS — E785 Hyperlipidemia, unspecified: Secondary | ICD-10-CM | POA: Diagnosis not present

## 2021-02-03 DIAGNOSIS — M10372 Gout due to renal impairment, left ankle and foot: Secondary | ICD-10-CM | POA: Diagnosis not present

## 2021-02-03 DIAGNOSIS — K219 Gastro-esophageal reflux disease without esophagitis: Secondary | ICD-10-CM | POA: Diagnosis not present

## 2021-02-03 DIAGNOSIS — G4700 Insomnia, unspecified: Secondary | ICD-10-CM | POA: Diagnosis not present

## 2021-02-03 DIAGNOSIS — Z7901 Long term (current) use of anticoagulants: Secondary | ICD-10-CM | POA: Diagnosis not present

## 2021-02-03 DIAGNOSIS — I2510 Atherosclerotic heart disease of native coronary artery without angina pectoris: Secondary | ICD-10-CM | POA: Diagnosis not present

## 2021-02-03 DIAGNOSIS — J449 Chronic obstructive pulmonary disease, unspecified: Secondary | ICD-10-CM | POA: Diagnosis not present

## 2021-02-11 ENCOUNTER — Ambulatory Visit (INDEPENDENT_AMBULATORY_CARE_PROVIDER_SITE_OTHER): Payer: Medicare Other

## 2021-02-11 DIAGNOSIS — I495 Sick sinus syndrome: Secondary | ICD-10-CM

## 2021-02-11 LAB — CUP PACEART REMOTE DEVICE CHECK
Battery Remaining Longevity: 131 mo
Battery Voltage: 3.02 V
Brady Statistic AP VP Percent: 0.05 %
Brady Statistic AP VS Percent: 48.74 %
Brady Statistic AS VP Percent: 0.07 %
Brady Statistic AS VS Percent: 51.14 %
Brady Statistic RA Percent Paced: 48.81 %
Brady Statistic RV Percent Paced: 0.13 %
Date Time Interrogation Session: 20220414033014
Implantable Lead Implant Date: 20100309
Implantable Lead Implant Date: 20100309
Implantable Lead Location: 753859
Implantable Lead Location: 753860
Implantable Lead Model: 5076
Implantable Lead Model: 5076
Implantable Pulse Generator Implant Date: 20190327
Lead Channel Impedance Value: 323 Ohm
Lead Channel Impedance Value: 399 Ohm
Lead Channel Impedance Value: 912 Ohm
Lead Channel Impedance Value: 950 Ohm
Lead Channel Pacing Threshold Amplitude: 0.5 V
Lead Channel Pacing Threshold Amplitude: 0.625 V
Lead Channel Pacing Threshold Pulse Width: 0.4 ms
Lead Channel Pacing Threshold Pulse Width: 0.4 ms
Lead Channel Sensing Intrinsic Amplitude: 17.125 mV
Lead Channel Sensing Intrinsic Amplitude: 17.125 mV
Lead Channel Sensing Intrinsic Amplitude: 3.125 mV
Lead Channel Sensing Intrinsic Amplitude: 3.125 mV
Lead Channel Setting Pacing Amplitude: 2 V
Lead Channel Setting Pacing Amplitude: 2.5 V
Lead Channel Setting Pacing Pulse Width: 0.4 ms
Lead Channel Setting Sensing Sensitivity: 1.2 mV

## 2021-02-15 ENCOUNTER — Other Ambulatory Visit: Payer: Self-pay

## 2021-02-15 ENCOUNTER — Ambulatory Visit (INDEPENDENT_AMBULATORY_CARE_PROVIDER_SITE_OTHER): Payer: Medicare Other | Admitting: Cardiovascular Disease

## 2021-02-15 ENCOUNTER — Encounter: Payer: Self-pay | Admitting: Cardiovascular Disease

## 2021-02-15 VITALS — BP 146/80 | HR 83 | Ht 65.5 in | Wt 178.0 lb

## 2021-02-15 DIAGNOSIS — I10 Essential (primary) hypertension: Secondary | ICD-10-CM | POA: Diagnosis not present

## 2021-02-15 DIAGNOSIS — E7801 Familial hypercholesterolemia: Secondary | ICD-10-CM | POA: Diagnosis not present

## 2021-02-15 DIAGNOSIS — I6522 Occlusion and stenosis of left carotid artery: Secondary | ICD-10-CM

## 2021-02-15 DIAGNOSIS — N1832 Chronic kidney disease, stage 3b: Secondary | ICD-10-CM | POA: Diagnosis not present

## 2021-02-15 DIAGNOSIS — Z7901 Long term (current) use of anticoagulants: Secondary | ICD-10-CM | POA: Diagnosis not present

## 2021-02-15 DIAGNOSIS — Z950 Presence of cardiac pacemaker: Secondary | ICD-10-CM

## 2021-02-15 DIAGNOSIS — J849 Interstitial pulmonary disease, unspecified: Secondary | ICD-10-CM | POA: Diagnosis not present

## 2021-02-15 DIAGNOSIS — I495 Sick sinus syndrome: Secondary | ICD-10-CM

## 2021-02-15 DIAGNOSIS — I5032 Chronic diastolic (congestive) heart failure: Secondary | ICD-10-CM

## 2021-02-15 DIAGNOSIS — I2510 Atherosclerotic heart disease of native coronary artery without angina pectoris: Secondary | ICD-10-CM

## 2021-02-15 DIAGNOSIS — I480 Paroxysmal atrial fibrillation: Secondary | ICD-10-CM | POA: Diagnosis not present

## 2021-02-15 LAB — PACEMAKER DEVICE OBSERVATION

## 2021-02-15 NOTE — Progress Notes (Signed)
Cardiology Office Note    Date:  02/15/2021   ID:  Amy [name], DOB 09/29/1922, MRN [id number]  PCP:  Celene Squibb, MD  Cardiologist:  Sanda Klein, MD   Electrophysiologist:  None   Evaluation Performed:  Follow-Up Visit  Chief Complaint:  CAD, AFib, PM, HLP  History of Present Illness:    Amy [name] is a 87 y.o. female with history of coronary artery disease and previous bypass surgery as well as sinus node dysfunction and persistent atrial arrhythmia, history of left inferior hemianopsia due to retinal artery occlusion.  She underwent a pacemaker generator change out in 2019 (Medtronic azure XT).  She has stage III chronic kidney disease and moderate carotid stenosis.  She has severe mixed hyperlipidemia intolerant of statins, on Repatha.  Over the last 6 months she has been struggling with worsening shortness of breath, NYHA functional class III.  She undergoes physical therapy.  She can only really put in good exercise every other session since she is exhausted the following day.  Also complains of severe aches and pains in her back hips legs and shoulders.  She has difficulty sleeping at night because of the pain.  Extensive evaluation for cardiac cause of her dyspnea has been unrevealing.  Her BNP is minimally elevated at 171 which for her age is probably completely normal.  Her echocardiogram shows normal left ventricular systolic function, grade 1 diastolic dysfunction (again normal for age) without evidence of elevated filling pressures.  Treatment with a higher dose of diuretic did not lead to any improvement in symptoms and worsen her renal function.  Her chest x-ray shows no evidence of pulmonary congestion or cardiomegaly. She is now taking furosemide alternating 20 mg and 40 mg daily.   She has a history of a secondary exposure to asbestos.  Her husband was a Scientist, product/process development and would come home with his close and face covered in asbestos dust.  She would help clean his  close.  He eventually died of asbestosis years later.  She denies chest discomfort, palpitations, dizziness, syncope, claudication or worsening of the lower extremity edema.  Pacemaker interrogation shows normal generator and lead parameters (estimated battery longevity 10.8 years, Medtronic Azure generator change in 2019). She is not pacemaker dependent, has 56 % atrial pacing and only 0.2% ventricular pacing. The burden of atrial fibrillation remains low at 0.3%.  This is predominantly made up by a single 7-hour episode of atrial fibrillation that occurred on March 18 good rate control. There have been no episodes of high ventricular rate. There has not really been any change in overall activity level compared to before the onset of symptoms in November, only active about 0.6 hours a day.  Most recent labs show a creatinine of 1.8 on 01/18/2021, returning back to baseline level after increasing on the higher dose of diuretic.  The potassium was 4.7 on 11/19/2020. Most recent lipid profile from 12/21/2020 shows a total cholesterol 216, HDL 57, triglycerides 206. Calculated LDL was 123; before treatment with Repatha her baseline LDL was about 260.   In 2009 she had an acute inferior wall ST segment elevation myocardial infarction treated with urgent angioplasty and placement of a bare-metal stent. Subsequently, she underwent multivessel bypass surgery with LIMA to LAD, SVG to ramus SVG to RCA. She has no evidence of residual myocardial injury and has preserved left ventricular systolic function. In June 2015, cardiac catheterization showed 90% proximal and mid LAD with patent distal LIMA bypass; 40% ostial  circumflex, not bypassed; unchanged 80% proximal ramus intermedius, SVG to ramus occluded; patent stent right coronary artery, SVG to right coronary artery occluded. Her LVEF was 55%, there was no significant mitral regurgitation and the LVEDP was high normal at 15 mm Hg, (when she was short of breath).  [name] stated that her dyspnea is likely multifactorial and cannot be explained completely by cardiac findings. In May 2016 she had a low risk nuclear study.  In 2010 a permanent pacemaker (Medtronic) was implanted for symptomatic bradycardia.  She underwent a generator change out in March 2019.  In late 2013 she had a fall complicated by a hip fracture and radius fracture. She had an esophageal perforation complicated by mediastinitis and had a feeding tube for several months. In October 2015 she had another fall in her home (no syncope, tripped at [facility].carpet transition) and fractured her pelvis and humerus.   She has fairly severe hyperlipidemia with an LDL cholesterol in excess of 200. She is statin, niacin, ezetimibe and resin intolerant. She takes Repatha.  She has systolic HTN, but also a tendency to marked orthostatic hypotension (25 mm Hg drop in SBP).  She has moderate CKD, baseline GFR around 30 mL/minute, has seen Dr. Lowanda Foster in the past, but does not have regular nephrology follow-up.  July 2019 she had an episode of unilateral inferior hemianopsia.  Brief episodes of atrial fibrillation were seen around that time.  There was also a moderate left internal carotid artery stenosis.  She had persistent atrial fibrillation from late July until November 2019.     Past Medical History:  Diagnosis Date  . Anxiety   . Arthritis   . Asthma   . CAD in native artery    s/p CABG x 3; Last Myoview in 07/2009 - non-ischemic; Echo 03/2012  Aortic Sclerosis with normal EF.  . CKD (chronic kidney disease), stage III ([place])   . DVT (deep vein thrombosis) in pregnancy   . Esophageal perforation    9/13  . Fall 07/21/2014   FALL WITH INJURY  . Fracture of head of humerus   . Fracture of hip ([place]) 07/21/2014   LEFT  . GERD (gastroesophageal reflux disease)   . Hypercholesteremia   . Hypertension   . Hypothyroidism   . Loss of vision 05/22/2018   LEFT EYE  . Mediastinitis     s/p drainage  . Myocardial infarction [facility])    Inferior STEMI 07/2008 - PCI of prox RCA followed byu CABG x 3 in 9/'09  . Pacemaker   . PAF (paroxysmal atrial fibrillation) ([place])   . Paroxysmal atrial flutter ([place]) 11/26/2014  . Pneumonia   . Shortness of breath   . SSS (sick sinus syndrome) ([place]) 11/26/2014  . Status post hip hemiarthroplasty left hip by Dr Ronnie Derby 08/08/2012   Past Surgical History:  Procedure Laterality Date  . CARDIOVERSION N/A 09/21/2018   Procedure: CARDIOVERSION;  Surgeon: Sanda Klein, MD;  Location: [place] ENDOSCOPY;  Service: Cardiovascular;  Laterality: N/A;  . CARPAL TUNNEL RELEASE  08/08/2012   Procedure: CARPAL TUNNEL RELEASE;  Surgeon: Schuyler Amor, MD;  Location: [place];  Service: Orthopedics;  Laterality: Left;  . CORONARY ARTERY BYPASS GRAFT  2009   LIMA-LAD, SVG-RI, SVG-stented RCA  . ESOPHAGOGASTRODUODENOSCOPY  08/10/2012   Procedure: ESOPHAGOGASTRODUODENOSCOPY (EGD);  Surgeon: Beryle Beams, MD;  Location: [facility] ENDOSCOPY;  Service: Endoscopy;  Laterality: N/A;  . ESOPHAGOSCOPY  08/10/2012   Procedure: ESOPHAGOSCOPY;  Surgeon: [name], MD;  Location: [place];  Service: ENT;  Laterality: N/A;  . GASTROSTOMY  08/16/2012   Procedure: GASTROSTOMY;  Surgeon: Zenovia Jarred, MD;  Location: [place];  Service: General;  Laterality: N/A;  . HIP ARTHROPLASTY  08/08/2012   Procedure: ARTHROPLASTY BIPOLAR HIP;  Surgeon: Rudean Haskell, MD;  Location: [place];  Service: Orthopedics;  Laterality: Left;  [place]   . JOINT REPLACEMENT    . LEFT HEART CATHETERIZATION WITH CORONARY ANGIOGRAM N/A 04/16/2014   Procedure: LEFT HEART CATHETERIZATION WITH CORONARY ANGIOGRAM;  Surgeon: Wellington Hampshire, MD;  Location: [place] CATH LAB;  Service: Cardiovascular;  Laterality: N/A;  . NM MYOCAR PERF WALL MOTION  08/14/2009   Normal  . PACEMAKER INSERTION  01/06/2009   Medtronic  . PPM GENERATOR CHANGEOUT N/A 01/24/2018   Procedure: PPM GENERATOR CHANGEOUT;  Surgeon: Sanda Klein,  MD;  Location: [place] CV LAB;  Service: Cardiovascular;  Laterality: N/A;  . TOTAL HIP ARTHROPLASTY    . WRIST FRACTURE SURGERY  07/2012   left intra articular  with carpel tunnel     Current Meds  Medication Sig  . acetaminophen (TYLENOL) 500 MG tablet Take 1,000 mg by mouth every 6 (six) hours as needed for moderate pain.  Marland Kitchen albuterol (PROVENTIL HFA;VENTOLIN HFA) 108 (90 Base) MCG/ACT inhaler Inhale 2 puffs into the lungs every 6 (six) hours as needed for wheezing or shortness of breath.   . allopurinol (ZYLOPRIM) 100 MG tablet Take 100 mg by mouth daily.  . calcium carbonate (TUMS - DOSED IN MG ELEMENTAL CALCIUM) 500 MG chewable tablet Chew 2 tablets by mouth daily as needed for indigestion or heartburn.  . Cholecalciferol (VITAMIN D3 PO) Take 1 tablet by mouth daily.  . cycloSPORINE (RESTASIS) 0.05 % ophthalmic emulsion Place 1 drop into both eyes 2 (two) times daily.  Marland Kitchen docusate sodium (COLACE) 100 MG capsule Take 100 mg by mouth 2 (two) times daily.  Marland Kitchen ELIQUIS 2.5 MG TABS tablet Take 1 tablet by mouth twice daily  . EUTHYROX 75 MCG tablet Take 75 mcg by mouth every morning.  . Fluticasone-Umeclidin-Vilant 100-62.5-25 MCG/INH AEPB Inhale 1 puff into the lungs daily.   . furosemide (LASIX) 40 MG tablet Pt take 40 mg alternating with 20 mg next day  . gabapentin (NEURONTIN) 100 MG capsule Take 1 capsule (100 mg total) by mouth 2 (two) times daily.  Marland Kitchen loratadine (CLARITIN) 10 MG tablet Take 10 mg by mouth daily as needed for allergies.  Marland Kitchen LORazepam (ATIVAN) 0.5 MG tablet Take 0.5-1 mg by mouth at bedtime as needed.  . metoprolol succinate (TOPROL-XL) 25 MG 24 hr tablet Take 1 tablet by mouth once daily  . Misc Natural Products (TART CHERRY ADVANCED PO) Take 1 tablet by mouth daily.  . nitroGLYCERIN (NITROSTAT) 0.4 MG SL tablet Place 1 tablet (0.4 mg total) under the tongue every 5 (five) minutes x 3 doses as needed for chest pain.  Marland Kitchen omeprazole (PRILOSEC) 40 MG capsule Take 1 capsule  by mouth every morning.  . ondansetron (ZOFRAN) 4 MG tablet Take 1 tablet by mouth every 8 (eight) hours as needed for nausea or vomiting.   . potassium chloride (KLOR-CON) 10 MEQ tablet Take 10 mEq by mouth 2 (two) times daily.  . Probiotic CAPS Take 1 capsule by mouth daily.  Marland Kitchen REPATHA SURECLICK 353 MG/ML SOAJ Inject 140 mg as directed every 14 (fourteen) days.   . [DISCONTINUED] furosemide (LASIX) 40 MG tablet Take 1 tablet (40 mg total) by mouth daily. (Patient taking differently: Pt take  40 mg alternating with 20 mg next day)  . [DISCONTINUED] levothyroxine (SYNTHROID, LEVOTHROID) 50 MCG tablet Take 100 mcg by mouth daily before breakfast.  . [DISCONTINUED] omeprazole (PRILOSEC) 20 MG capsule Take 1 capsule (20 mg total) by mouth 2 (two) times daily before a meal.  . [DISCONTINUED] potassium chloride (KLOR-CON) 10 MEQ tablet Take 2 tablets (20 mEq total) by mouth daily. (Patient taking differently: Take 10 mEq by mouth daily.)     Allergies:   Colestipol, Hydrocodone, Niacin and related, Statins, and Zetia [ezetimibe]   Social History   Tobacco Use  . Smoking status: Never Smoker  . Smokeless tobacco: Never Used  Vaping Use  . Vaping Use: Never used  Substance Use Topics  . Alcohol use: No  . Drug use: No     Family Hx: The patient's family history includes Stroke in her father.  ROS:   Please see the history of present illness.    All other systems are reviewed and are negative.   Prior CV studies:   The following studies were reviewed today: Comprehensive remote pacemaker check today  Labs/Other Tests and Data Reviewed:    ECHO 11/20/2020 1. Left ventricular ejection fraction, by estimation, is 60 to 65%. The  left ventricle has normal function. The left ventricle has no regional  wall motion abnormalities. Left ventricular diastolic parameters are  consistent with Grade I diastolic  dysfunction (impaired relaxation).  2. Right ventricular systolic function is  normal. The right ventricular  size is normal.  3. Right atrial size was moderately dilated.  4. The mitral valve is normal in structure. Mild mitral valve  regurgitation. No evidence of mitral stenosis.  5. Tricuspid valve regurgitation is severe.  6. The aortic valve is tricuspid. Aortic valve regurgitation is trivial.  Mild aortic valve stenosis.  7. The inferior vena cava is normal in size with greater than 50%  respiratory variability, suggesting right atrial pressure of 3 mmHg.   Comparison(s): EF 60-65%, trivial pericardial effusion, AV was restricted  with no stenosis seen.   CXR 11/24/2020: FINDINGS: Post CABG changes. Left-sided implanted cardiac device. The heart size and mediastinal contours are within normal limits. Atherosclerotic calcification of the aortic knob. Biapical pleuroparenchymal scarring. No focal airspace consolidation, pleural effusion, or pneumothorax. Chronic posttraumatic deformity of the proximal left humerus.  IMPRESSION: No active cardiopulmonary disease.   EKG: Ordered today, personally reviewed, unchanged from previous tracings, shows atrial paced, ventricular sensed rhythm with prolonged AV delay 274 ms, with a QS pattern in leads V1-V2, QTC 411 ms  Recent Labs: 11/19/2020: BNP 171.0; BUN 28; Creatinine, Ser 1.51; Potassium 4.7; Sodium 144   Recent Lipid Panel Lab Results  Component Value Date/Time   CHOL 193 08/13/2018 09:55 AM   TRIG 153 (H) 08/13/2018 09:55 AM   HDL 51 08/13/2018 09:55 AM   CHOLHDL 3.8 08/13/2018 09:55 AM   CHOLHDL 6.0 05/23/2018 08:56 AM   LDLCALC 111 (H) 08/13/2018 09:55 AM   Labs from 02/25/2019 show total cholesterol 192, HDL 60, LDL low not reported, triglycerides 175, hemoglobin A1c 5.9%, hemoglobin 13, creatinine 1.57, ALT 11, TSH 3.00  03/04/2020 creatinine 1.58, which is pretty much her baseline.   Hemoglobin A1c 6% Hemoglobin 12.4 TSH 4.42 Normal liver function tests total cholesterol 188, HDL 52,  triglycerides 231  09/02/2020 Total cholesterol 213, HDL 53, triglycerides 62, calculated LDL 127, hemoglobin A1c 6.3%  10/01/2020 Hemoglobin 12.8, creatinine 1.48, normal liver function test, TSH 2.08  Wt Readings from Last 3 Encounters:  02/15/21 178 lb (80.7 kg)  11/19/20 176 lb (79.8 kg)  09/26/20 170 lb (77.1 kg)     Objective:    Vital Signs:  BP (!) 146/80   Pulse 83   Ht 5' 5.5" (1.664 m)   Wt 178 lb (80.7 kg)   SpO2 97%   BMI 29.17 kg/m      General: Alert, oriented x3, no distress, appears younger than stated age Head: no evidence of trauma, PERRL, EOMI, no exophtalmos or lid lag, no myxedema, no xanthelasma; normal ears, nose and oropharynx Neck: normal jugular venous pulsations and no hepatojugular reflux; brisk carotid pulses without delay and no carotid bruits Chest: she has very loud crackles in the upper left lung field, especially anteriorly , no signs of consolidation by percussion or palpation, no wheezing or moist rales, symmetrical and full respiratory excursions Cardiovascular: normal position and quality of the apical impulse, regular rhythm, normal first and split second heart sounds, 2/6 early peaking aortic ejection murmur, no diastolic murmurs, rubs or gallops Abdomen: no tenderness or distention, no masses by palpation, no abnormal pulsatility or arterial bruits, normal bowel sounds, no hepatosplenomegaly Extremities: no clubbing, cyanosis or edema; 2+ radial, ulnar and brachial pulses bilaterally; 2+ right femoral, posterior tibial and dorsalis pedis pulses; 2+ left femoral, posterior tibial and dorsalis pedis pulses; no subclavian or femoral bruits Neurological: grossly nonfocal Psych: Normal mood and affect    ASSESSMENT & PLAN:    1. PAF (paroxysmal atrial fibrillation) ([place])   2. Long term current use of anticoagulant   3. Chronic diastolic heart failure ([place])   4. Interstitial pulmonary disease ([place])   5. Coronary artery disease involving  native coronary artery of native heart without angina pectoris   6. SSS (sick sinus syndrome) ([facility])   7. pacemaker Medtronic, leads 2010, generator 2019   8. Essential hypertension   9. Heterozygous familial hypercholesterolemia   10. Left carotid stenosis   11. Stage 3b chronic kidney disease ([place])      1. AFib: low prevalence, rate controlled, asymptomatic.  On appropriate anticoagulation, CHADSVasc 8 (age 13, CVA 2, gender, HF, PAD/CAD, HTN).  2. Eliquis: Dose adjusted for age and kidney function. No serious bleeding complications. 3. CHF: no evidence of hypervolemia clinically, by CR, by echo or by BNP. No improvement in dyspnea with higher diuretic dose. Suspect noncardiac cause of dyspnea. 4. Dyspnea: consider interstitial lung disease based on dry crackles on exam and unremarkable CXR. Possible asbestosis? Check CT chest. 5. CAD s/p CABG: No chest pain. Normal LV wall motion and EF. 6. SSS: Normal heart rate histogram distribution. Arrhythmia does not explain her dyspnea. 7. PM: recent generator change, normal lead parameters and normal device function. 8. HTN: BP OK today, has had symptomatic low DBP in the past. 9. HLP: Severe cholesterol elevation with serious atherosclerotic disease, consistent with familial heterozygous hypercholesterolemia. LDL cholesterol remains elevated, but markedly improved compared to her baseline. Intolerant to statins/ezetimibe/resins but doing well on Repatha. 10. Left internal carotid artery stenosis: Medical management. No new neurological complaints. 11. CKD 3B: at baseline per recent labs. Monitor carefully with increased diuretics.  Patient Instructions  Medication Instructions:  No changes *If you need a refill on your cardiac medications before your next appointment, please call your pharmacy*   Lab Work: None ordered If you have labs (blood work) drawn today and your tests are completely normal, you will receive your results only  by: Marland Kitchen MyChart Message (if you have MyChart) OR . A paper  copy in the mail If you have any lab test that is abnormal or we need to change your treatment, we will call you to review the results.   Testing/Procedures: Dr. Sallyanne Kuster has ordered a CT of the Chest. They will call you to set this up.   Follow-Up: At [facility], you and your health needs are our priority.  As part of our continuing mission to provide you with exceptional heart care, we have created designated Provider Care Teams.  These Care Teams include your primary Cardiologist (physician) and Advanced Practice Providers (APPs -  Physician Assistants and Nurse Practitioners) who all work together to provide you with the care you need, when you need it.  We recommend signing up for the patient portal called "MyChart".  Sign up information is provided on this After Visit Summary.  MyChart is used to connect with patients for Virtual Visits (Telemedicine).  Patients are able to view lab/test results, encounter notes, upcoming appointments, etc.  Non-urgent messages can be sent to your provider as well.   To learn more about what you can do with MyChart, go to [email or web address].    Your next appointment:   12 month(s)  The format for your next appointment:   In Person  Provider:   Sanda Klein, MD     Signed, Sanda Klein, MD  02/15/2021 2:08 PM    [place]

## 2021-02-15 NOTE — Patient Instructions (Signed)
Medication Instructions:  No changes *If you need a refill on your cardiac medications before your next appointment, please call your pharmacy*   Lab Work: None ordered If you have labs (blood work) drawn today and your tests are completely normal, you will receive your results only by: Marland Kitchen MyChart Message (if you have MyChart) OR . A paper copy in the mail If you have any lab test that is abnormal or we need to change your treatment, we will call you to review the results.   Testing/Procedures: Dr. Sallyanne Kuster has ordered a CT of the Chest. They will call you to set this up.   Follow-Up: At [facility], you and your health needs are our priority.  As part of our continuing mission to provide you with exceptional heart care, we have created designated Provider Care Teams.  These Care Teams include your primary Cardiologist (physician) and Advanced Practice Providers (APPs -  Physician Assistants and Nurse Practitioners) who all work together to provide you with the care you need, when you need it.  We recommend signing up for the patient portal called "MyChart".  Sign up information is provided on this After Visit Summary.  MyChart is used to connect with patients for Virtual Visits (Telemedicine).  Patients are able to view lab/test results, encounter notes, upcoming appointments, etc.  Non-urgent messages can be sent to your provider as well.   To learn more about what you can do with MyChart, go to [email or web address].    Your next appointment:   12 month(s)  The format for your next appointment:   In Person  Provider:   Sanda Klein, MD

## 2021-02-16 ENCOUNTER — Telehealth: Payer: Self-pay | Admitting: Cardiovascular Disease

## 2021-02-16 NOTE — Telephone Encounter (Signed)
Spoke with patient regarding the Friday 03/19/21 2:00pm Chest CT appointment at [facility] time is 1:45 pm 1st floor radiology for check in---will mail information to patient and she voiced her understanding.

## 2021-02-17 DIAGNOSIS — N184 Chronic kidney disease, stage 4 (severe): Secondary | ICD-10-CM | POA: Diagnosis not present

## 2021-02-17 DIAGNOSIS — I5032 Chronic diastolic (congestive) heart failure: Secondary | ICD-10-CM | POA: Diagnosis not present

## 2021-02-17 DIAGNOSIS — I130 Hypertensive heart and chronic kidney disease with heart failure and stage 1 through stage 4 chronic kidney disease, or unspecified chronic kidney disease: Secondary | ICD-10-CM | POA: Diagnosis not present

## 2021-02-17 DIAGNOSIS — J449 Chronic obstructive pulmonary disease, unspecified: Secondary | ICD-10-CM | POA: Diagnosis not present

## 2021-02-17 DIAGNOSIS — M19072 Primary osteoarthritis, left ankle and foot: Secondary | ICD-10-CM | POA: Diagnosis not present

## 2021-02-17 DIAGNOSIS — M10372 Gout due to renal impairment, left ankle and foot: Secondary | ICD-10-CM | POA: Diagnosis not present

## 2021-03-01 NOTE — Progress Notes (Signed)
Remote pacemaker transmission.   

## 2021-03-19 ENCOUNTER — Other Ambulatory Visit: Payer: Self-pay

## 2021-03-19 ENCOUNTER — Other Ambulatory Visit: Payer: Self-pay | Admitting: Cardiovascular Disease

## 2021-03-19 ENCOUNTER — Ambulatory Visit (HOSPITAL_COMMUNITY)
Admission: RE | Admit: 2021-03-19 | Discharge: 2021-03-19 | Disposition: A | Discharge status: Home / Self Care | Payer: Medicare Other | Source: Ambulatory Visit | Attending: Cardiovascular Disease | Admitting: Cardiovascular Disease

## 2021-03-19 DIAGNOSIS — J849 Interstitial pulmonary disease, unspecified: Secondary | ICD-10-CM | POA: Insufficient documentation

## 2021-03-19 DIAGNOSIS — R0602 Shortness of breath: Secondary | ICD-10-CM | POA: Diagnosis not present

## 2021-03-23 ENCOUNTER — Telehealth: Payer: Self-pay | Admitting: Cardiovascular Disease

## 2021-03-23 NOTE — Telephone Encounter (Signed)
Returned call to patient's daughter, Juliann Pulse per DPR, and reviewed results of chest CT. She states her mother received the results but was not certain exactly what they meant. I answered questions to [facility] satisfaction and she agrees that they do not wish to pursue referral to pulmonologist. She thanked me for the call.

## 2021-03-23 NOTE — Telephone Encounter (Signed)
Follow up:    Patient daughter calling to get results mother can not hear very well.

## 2021-04-19 DIAGNOSIS — M109 Gout, unspecified: Secondary | ICD-10-CM | POA: Diagnosis not present

## 2021-04-19 DIAGNOSIS — I10 Essential (primary) hypertension: Secondary | ICD-10-CM | POA: Diagnosis not present

## 2021-04-19 DIAGNOSIS — R7301 Impaired fasting glucose: Secondary | ICD-10-CM | POA: Diagnosis not present

## 2021-04-19 DIAGNOSIS — E039 Hypothyroidism, unspecified: Secondary | ICD-10-CM | POA: Diagnosis not present

## 2021-04-22 DIAGNOSIS — I6529 Occlusion and stenosis of unspecified carotid artery: Secondary | ICD-10-CM | POA: Diagnosis not present

## 2021-04-22 DIAGNOSIS — I10 Essential (primary) hypertension: Secondary | ICD-10-CM | POA: Diagnosis not present

## 2021-04-22 DIAGNOSIS — M7989 Other specified soft tissue disorders: Secondary | ICD-10-CM | POA: Diagnosis not present

## 2021-04-22 DIAGNOSIS — I2510 Atherosclerotic heart disease of native coronary artery without angina pectoris: Secondary | ICD-10-CM | POA: Diagnosis not present

## 2021-04-22 DIAGNOSIS — E039 Hypothyroidism, unspecified: Secondary | ICD-10-CM | POA: Diagnosis not present

## 2021-04-22 DIAGNOSIS — K219 Gastro-esophageal reflux disease without esophagitis: Secondary | ICD-10-CM | POA: Diagnosis not present

## 2021-04-22 DIAGNOSIS — G2581 Restless legs syndrome: Secondary | ICD-10-CM | POA: Diagnosis not present

## 2021-04-22 DIAGNOSIS — R7301 Impaired fasting glucose: Secondary | ICD-10-CM | POA: Diagnosis not present

## 2021-04-22 DIAGNOSIS — E782 Mixed hyperlipidemia: Secondary | ICD-10-CM | POA: Diagnosis not present

## 2021-04-22 DIAGNOSIS — I5032 Chronic diastolic (congestive) heart failure: Secondary | ICD-10-CM | POA: Diagnosis not present

## 2021-04-22 DIAGNOSIS — N184 Chronic kidney disease, stage 4 (severe): Secondary | ICD-10-CM | POA: Diagnosis not present

## 2021-04-22 DIAGNOSIS — M109 Gout, unspecified: Secondary | ICD-10-CM | POA: Diagnosis not present

## 2021-04-29 DIAGNOSIS — I10 Essential (primary) hypertension: Secondary | ICD-10-CM | POA: Diagnosis not present

## 2021-04-29 DIAGNOSIS — E1165 Type 2 diabetes mellitus with hyperglycemia: Secondary | ICD-10-CM | POA: Diagnosis not present

## 2021-05-06 DIAGNOSIS — H34232 Retinal artery branch occlusion, left eye: Secondary | ICD-10-CM | POA: Diagnosis not present

## 2021-05-06 DIAGNOSIS — H35371 Puckering of macula, right eye: Secondary | ICD-10-CM | POA: Diagnosis not present

## 2021-05-06 DIAGNOSIS — H40013 Open angle with borderline findings, low risk, bilateral: Secondary | ICD-10-CM | POA: Diagnosis not present

## 2021-05-06 DIAGNOSIS — H04123 Dry eye syndrome of bilateral lacrimal glands: Secondary | ICD-10-CM | POA: Diagnosis not present

## 2021-05-13 ENCOUNTER — Ambulatory Visit (INDEPENDENT_AMBULATORY_CARE_PROVIDER_SITE_OTHER): Payer: Medicare Other

## 2021-05-13 DIAGNOSIS — I495 Sick sinus syndrome: Secondary | ICD-10-CM | POA: Diagnosis not present

## 2021-05-13 LAB — CUP PACEART REMOTE DEVICE CHECK
Battery Remaining Longevity: 128 mo
Battery Voltage: 3.02 V
Brady Statistic AP VP Percent: 0.09 %
Brady Statistic AP VS Percent: 56.42 %
Brady Statistic AS VP Percent: 0.07 %
Brady Statistic AS VS Percent: 43.41 %
Brady Statistic RA Percent Paced: 56.61 %
Brady Statistic RV Percent Paced: 0.16 %
Date Time Interrogation Session: 20220713200558
Implantable Lead Implant Date: 20100309
Implantable Lead Implant Date: 20100309
Implantable Lead Location: 753859
Implantable Lead Location: 753860
Implantable Lead Model: 5076
Implantable Lead Model: 5076
Implantable Pulse Generator Implant Date: 20190327
Lead Channel Impedance Value: 361 Ohm
Lead Channel Impedance Value: 418 Ohm
Lead Channel Impedance Value: 817 Ohm
Lead Channel Impedance Value: 855 Ohm
Lead Channel Pacing Threshold Amplitude: 0.5 V
Lead Channel Pacing Threshold Amplitude: 0.625 V
Lead Channel Pacing Threshold Pulse Width: 0.4 ms
Lead Channel Pacing Threshold Pulse Width: 0.4 ms
Lead Channel Sensing Intrinsic Amplitude: 14 mV
Lead Channel Sensing Intrinsic Amplitude: 14 mV
Lead Channel Sensing Intrinsic Amplitude: 3.625 mV
Lead Channel Sensing Intrinsic Amplitude: 3.625 mV
Lead Channel Setting Pacing Amplitude: 2 V
Lead Channel Setting Pacing Amplitude: 2.5 V
Lead Channel Setting Pacing Pulse Width: 0.4 ms
Lead Channel Setting Sensing Sensitivity: 1.2 mV

## 2021-06-03 DIAGNOSIS — R946 Abnormal results of thyroid function studies: Secondary | ICD-10-CM | POA: Diagnosis not present

## 2021-06-03 DIAGNOSIS — I252 Old myocardial infarction: Secondary | ICD-10-CM | POA: Diagnosis not present

## 2021-06-03 DIAGNOSIS — I2510 Atherosclerotic heart disease of native coronary artery without angina pectoris: Secondary | ICD-10-CM | POA: Diagnosis not present

## 2021-06-03 DIAGNOSIS — E785 Hyperlipidemia, unspecified: Secondary | ICD-10-CM | POA: Diagnosis not present

## 2021-06-03 DIAGNOSIS — I480 Paroxysmal atrial fibrillation: Secondary | ICD-10-CM | POA: Diagnosis not present

## 2021-06-03 DIAGNOSIS — N189 Chronic kidney disease, unspecified: Secondary | ICD-10-CM | POA: Diagnosis not present

## 2021-06-03 DIAGNOSIS — R58 Hemorrhage, not elsewhere classified: Secondary | ICD-10-CM | POA: Diagnosis not present

## 2021-06-03 DIAGNOSIS — Z13220 Encounter for screening for lipoid disorders: Secondary | ICD-10-CM | POA: Diagnosis not present

## 2021-06-03 DIAGNOSIS — Z131 Encounter for screening for diabetes mellitus: Secondary | ICD-10-CM | POA: Diagnosis not present

## 2021-06-03 DIAGNOSIS — E559 Vitamin D deficiency, unspecified: Secondary | ICD-10-CM | POA: Diagnosis not present

## 2021-06-03 DIAGNOSIS — R7303 Prediabetes: Secondary | ICD-10-CM | POA: Diagnosis not present

## 2021-06-07 NOTE — Progress Notes (Signed)
Remote pacemaker transmission.   

## 2021-06-08 ENCOUNTER — Encounter: Payer: Self-pay | Admitting: Internal Medicine

## 2021-06-14 ENCOUNTER — Encounter (INDEPENDENT_AMBULATORY_CARE_PROVIDER_SITE_OTHER): Payer: Self-pay | Admitting: Gastroenterology

## 2021-06-14 ENCOUNTER — Other Ambulatory Visit: Payer: Self-pay

## 2021-06-14 ENCOUNTER — Ambulatory Visit (INDEPENDENT_AMBULATORY_CARE_PROVIDER_SITE_OTHER): Payer: Medicare Other | Admitting: Gastroenterology

## 2021-06-14 DIAGNOSIS — K641 Second degree hemorrhoids: Secondary | ICD-10-CM | POA: Diagnosis not present

## 2021-06-14 DIAGNOSIS — K649 Unspecified hemorrhoids: Secondary | ICD-10-CM | POA: Insufficient documentation

## 2021-06-14 NOTE — Progress Notes (Addendum)
Maylon Peppers, M.D. Gastroenterology & Hepatology [facility] [address] [place], [place] [address]  Primary Care Physician: Celene Squibb, MD [place] [id number]  I will communicate my assessment and recommendations to the referring MD via EMR.  Problems: Rectal bleeding, likely hemorrhoidal  History of Present Illness: Amy [name] is a 87 y.o. female with past medical history of anxiety, asthma coronary artery disease status post CABG, CKD, DVT, hypertension, hyperlipidemia, hypothyroidism, esophageal perforation complicated by mediastinitis, myocardial infarction, sick sinus syndrome,  paroxysmal atrial fibrillation and GERD, who presents for evaluation of rectal bleeding.  The patient was last seen on 07/25/2018. At that time, the patient was being managed with Zantac for episodes of heartburn and MiraLAX for constipation.  Patient reports that for the last 2 weeks she has presented recurrent episodes of large amount of blood in her commode. The person that takes care of her was the one that saw it but she reports she has only seen blood when wiping.  However, she has been concerned as she was told it was a large amount of blood in her stool.  She usually does not have pain when defecating but sometimes she feels soreness in her anal area. She states she has had an intermittent hemorrhoid for years but never bleeding significantly.  Denies any abdominal pain, nausea, vomiting, fever, chills, melena, hematemesis, abdominal distention, diarrhea, jaundice, pruritus or weight loss.  She uses witch hazel as needed but not regularly.  She used to have to strain significantly to have a BM, but now she only strain when she starts having a bowel movement and afterwards she has loose Bms. She takes a stool softener from [employer], but very occasionally uses Miralax when constipated.  Patient takes Eliquis for  afib.  Last Colonoscopy: Never had a colonoscopy in the past.  Past Medical History: Past Medical History:  Diagnosis Date   Anxiety    Arthritis    Asthma    CAD in native artery    s/p CABG x 3; Last Myoview in 07/2009 - non-ischemic; Echo 03/2012  Aortic Sclerosis with normal EF.   CKD (chronic kidney disease), stage III (HCC)    DVT (deep vein thrombosis) in pregnancy    Esophageal perforation    9/13   Fall 07/21/2014   FALL WITH INJURY   Fracture of head of humerus    Fracture of hip ([place]) 07/21/2014   LEFT   GERD (gastroesophageal reflux disease)    Hypercholesteremia    Hypertension    Hypothyroidism    Loss of vision 05/22/2018   LEFT EYE   Mediastinitis    s/p drainage   Myocardial infarction [facility])    Inferior STEMI 07/2008 - PCI of prox RCA followed byu CABG x 3 in 9/'09   Pacemaker    PAF (paroxysmal atrial fibrillation) (HCC)    Paroxysmal atrial flutter ([place]) 11/26/2014   Pneumonia    Shortness of breath    SSS (sick sinus syndrome) ([place]) 11/26/2014   Status post hip hemiarthroplasty left hip by Dr Ronnie Derby 08/08/2012    Past Surgical History: Past Surgical History:  Procedure Laterality Date   CARDIOVERSION N/A 09/21/2018   Procedure: CARDIOVERSION;  Surgeon: Sanda Klein, MD;  Location: [place] ENDOSCOPY;  Service: Cardiovascular;  Laterality: N/A;   CARPAL TUNNEL RELEASE  08/08/2012   Procedure: CARPAL TUNNEL RELEASE;  Surgeon: Schuyler Amor, MD;  Location: [place];  Service: Orthopedics;  Laterality: Left;   CORONARY ARTERY BYPASS GRAFT  2009   LIMA-LAD, SVG-RI, SVG-stented RCA   ESOPHAGOGASTRODUODENOSCOPY  08/10/2012   Procedure: ESOPHAGOGASTRODUODENOSCOPY (EGD);  Surgeon: Beryle Beams, MD;  Location: [facility] END[facility]COPY;  Service: Endoscopy;  Laterality: N/A;   ESOPHAGOSCOPY  08/10/2012   Procedure: ESOPHAGOSCOPY;  Surgeon: [name], MD;  Location: [place];  Service: ENT;  Laterality: N/A;   GASTROSTOMY  08/16/2012   Procedure: GASTROSTOMY;  Surgeon:  Zenovia Jarred, MD;  Location: [place];  Service: General;  Laterality: N/A;   HIP ARTHROPLASTY  08/08/2012   Procedure: ARTHROPLASTY BIPOLAR HIP;  Surgeon: Rudean Haskell, MD;  Location: [place];  Service: Orthopedics;  Laterality: Left;  [place]    JOINT REPLACEMENT     LEFT HEART CATHETERIZATION WITH CORONARY ANGIOGRAM N/A 04/16/2014   Procedure: LEFT HEART CATHETERIZATION WITH CORONARY ANGIOGRAM;  Surgeon: Wellington Hampshire, MD;  Location: [place] CATH LAB;  Service: Cardiovascular;  Laterality: N/A;   NM MYOCAR PERF WALL MOTION  08/14/2009   Normal   PACEMAKER INSERTION  01/06/2009   [employer]   PPM GENERATOR CHANGEOUT N/A 01/24/2018   Procedure: PPM GENERATOR CHANGEOUT;  Surgeon: Sanda Klein, MD;  Location: [place] CV LAB;  Service: Cardiovascular;  Laterality: N/A;   TOTAL HIP ARTHROPLASTY     WRIST FRACTURE SURGERY  07/2012   left intra articular  with carpel tunnel    Family History: Family History  Problem Relation Age of Onset   Stroke Father     Social History: Social History   Tobacco Use  Smoking Status Never  Smokeless Tobacco Never   Social History   Substance and Sexual Activity  Alcohol Use No   Social History   Substance and Sexual Activity  Drug Use No    Allergies: Allergies  Allergen Reactions   Colestipol Other (See Comments)    Muscle aches   Hydrocodone Itching   Niacin And Related Other (See Comments)    Muscle pain   Statins Other (See Comments)    Muscle aches on all she has tried.  She recalls Lipitor, Crestor, and Livalo but thinks there were others   Zetia [Ezetimibe] Other (See Comments)    Muscle aches    Medications: Current Outpatient Medications  Medication Sig Dispense Refill   acetaminophen (TYLENOL) 500 MG tablet Take 1,000 mg by mouth every 6 (six) hours as needed for moderate pain.     albuterol (PROVENTIL HFA;VENTOLIN HFA) 108 (90 Base) MCG/ACT inhaler Inhale 2 puffs into the lungs every 6 (six) hours as needed for  wheezing or shortness of breath.      allopurinol (ZYLOPRIM) 100 MG tablet Take 100 mg by mouth daily.  2   calcium carbonate (TUMS - DOSED IN MG ELEMENTAL CALCIUM) 500 MG chewable tablet Chew 2 tablets by mouth daily as needed for indigestion or heartburn.     cetirizine (ZYRTEC) 10 MG tablet Take 10 mg by mouth daily.     Cholecalciferol (VITAMIN D3 PO) Take 1 tablet by mouth daily.     cycloSPORINE (RESTASIS) 0.05 % ophthalmic emulsion Place 1 drop into both eyes 2 (two) times daily.     docusate sodium (COLACE) 100 MG capsule Take 100 mg by mouth 2 (two) times daily.     ELIQUIS 2.5 MG TABS tablet Take 1 tablet by mouth twice daily 180 tablet 0   EUTHYROX 75 MCG tablet Take 75 mcg by mouth every morning.     Fluticasone-Umeclidin-Vilant 100-62.5-25 MCG/INH AEPB Inhale 1  puff into the lungs daily.      furosemide (LASIX) 40 MG tablet Pt take 40 mg alternating with 20 mg next day     gabapentin (NEURONTIN) 100 MG capsule Take 1 capsule (100 mg total) by mouth 2 (two) times daily. 180 capsule 0   ibuprofen (ADVIL) 200 MG tablet Take 200 mg by mouth. As needed     LORazepam (ATIVAN) 0.5 MG tablet Take 0.5-1 mg by mouth at bedtime as needed.     metoprolol succinate (TOPROL-XL) 25 MG 24 hr tablet Take 1 tablet by mouth once daily 90 tablet 2   omeprazole (PRILOSEC) 40 MG capsule Take 1 capsule by mouth every morning.     potassium chloride (KLOR-CON) 10 MEQ tablet Take 10 mEq by mouth 2 (two) times daily.     Probiotic CAPS Take 1 capsule by mouth daily.     REPATHA SURECLICK 481 MG/ML SOAJ Inject 140 mg as directed every 14 (fourteen) days.   5   nitroGLYCERIN (NITROSTAT) 0.4 MG SL tablet Place 1 tablet (0.4 mg total) under the tongue every 5 (five) minutes x 3 doses as needed for chest pain. (Patient not taking: Reported on 06/14/2021) 25 tablet 12   No current facility-administered medications for this visit.    Review of Systems: GENERAL: negative for malaise, night sweats HEENT: No  changes in hearing or vision, no nose bleeds or other nasal problems. NECK: Negative for lumps, goiter, pain and significant neck swelling RESPIRATORY: Negative for cough, wheezing CARDIOVASCULAR: Negative for chest pain, leg swelling, palpitations, orthopnea GI: SEE HPI MUSCULOSKELETAL: Negative for joint pain or swelling, back pain, and muscle pain. SKIN: Negative for lesions, rash PSYCH: Negative for sleep disturbance, mood disorder and recent psychosocial stressors. HEMATOLOGY Negative for prolonged bleeding, bruising easily, and swollen nodes. ENDOCRINE: Negative for cold or heat intolerance, polyuria, polydipsia and goiter. NEURO: negative for tremor, gait imbalance, syncope and seizures. The remainder of the review of systems is noncontributory.   Physical Exam: BP 138/73 (BP Location: Right Arm, Patient Position: Sitting, Cuff Size: Large)   Pulse 60   Temp 98.8 F (37.1 C) (Oral)   Ht 5' 5.5" (1.664 m)   Wt 178 lb (80.7 kg)   BMI 29.17 kg/m  GENERAL: The patient is AO x3, in no acute distress. In wheelchair. HEENT: Head is normocephalic and atraumatic. EOMI are intact. Mouth is well hydrated and without lesions. NECK: Supple. No masses LUNGS: Clear to auscultation. No presence of rhonchi/wheezing/rales. Adequate chest expansion HEART: RRR, normal s1 and s2. ABDOMEN: Soft, nontender, no guarding, no peritoneal signs, and nondistended. BS +. No masses. RECTAL EXAM: has external hemorrhoids without induration or signs of recent bleeding, has some associated small condylomas, normal tone, no masses or blood in stool. Chaperone Crystal Sutton CMA EXTREMITIES: Without any cyanosis, clubbing, rash, lesions or edema. NEUROLOGIC: AOx3, no focal motor deficit. SKIN: no jaundice, no rashes  Imaging/Labs: as above  I personally reviewed and interpreted the available labs, imaging and endoscopic files.  Impression and Plan: Amy [name] is a 87 y.o. female with past medical  history of anxiety, asthma coronary artery disease status post CABG, CKD, DVT, hypertension, hyperlipidemia, hypothyroidism, esophageal perforation complicated by mediastinitis, myocardial infarction, sick sinus syndrome,  paroxysmal atrial fibrillation and GERD, who presents for evaluation of rectal bleeding.  The patient has presented intermittent episodes of rectal bleeding which became more often and severe recently.  She denies having any other major complaint.  Given her physical exam findings, it  seems that it is more likely coming from her hemorrhoids as she has to strain to move her bowels.  I encouraged her to take MiraLAX more often to make her stools softer and decrease the straining effort.  Since she has never had a colonoscopy, it is possible that the bleeding could be related to malignancy but given her age and comorbidities she is not interested in pursuing any endoscopic evaluation which I consider is reasonable.  She can try to use witch hazel or Preparation H as needed to relieve her anal discomfort.  - Start taking Miralax 1 capful every day for one week. If bowel movements do not improve, increase to 1 capful every 12 hours. If after two weeks there is no improvement, increase to 1 capful every 8 hours - Stop stool softeners - Can use Preparation H or Witch Hazel as needed for anal discomfort  All questions were answered.      Harvel Quale, MD Gastroenterology and Hepatology [facility]

## 2021-06-14 NOTE — Patient Instructions (Signed)
Start taking Miralax 1 capful every day for one week. If bowel movements do not improve, increase to 1 capful every 12 hours. If after two weeks there is no improvement, increase to 1 capful every 8 hours Stop stool softeners Can use PreparationH or [employer]	[place] as needed for anal discomfort

## 2021-06-21 DIAGNOSIS — E785 Hyperlipidemia, unspecified: Secondary | ICD-10-CM | POA: Diagnosis not present

## 2021-06-21 DIAGNOSIS — E039 Hypothyroidism, unspecified: Secondary | ICD-10-CM | POA: Diagnosis not present

## 2021-06-21 DIAGNOSIS — I252 Old myocardial infarction: Secondary | ICD-10-CM | POA: Diagnosis not present

## 2021-06-21 DIAGNOSIS — J984 Other disorders of lung: Secondary | ICD-10-CM | POA: Diagnosis not present

## 2021-06-21 DIAGNOSIS — I2510 Atherosclerotic heart disease of native coronary artery without angina pectoris: Secondary | ICD-10-CM | POA: Diagnosis not present

## 2021-06-21 DIAGNOSIS — I480 Paroxysmal atrial fibrillation: Secondary | ICD-10-CM | POA: Diagnosis not present

## 2021-06-21 DIAGNOSIS — K649 Unspecified hemorrhoids: Secondary | ICD-10-CM | POA: Diagnosis not present

## 2021-06-21 DIAGNOSIS — M545 Low back pain, unspecified: Secondary | ICD-10-CM | POA: Diagnosis not present

## 2021-07-14 ENCOUNTER — Telehealth: Payer: Self-pay | Admitting: Cardiovascular Disease

## 2021-07-14 NOTE — Telephone Encounter (Signed)
Patient calling the office for samples of medication:   1.  What medication and dosage are you requesting samples for? REPATHA SURECLICK 140 MG/ML SOAJ  2.  Are you currently out of this medication? Yes     

## 2021-07-15 MED ORDER — REPATHA SURECLICK 140 MG/ML ~~LOC~~ SOAJ: 140.0000 mg | SUBCUTANEOUS | 0 refills | Status: DC

## 2021-07-15 NOTE — Telephone Encounter (Signed)
Called and spoke to pt's daughter who stated that they needed samples and I placed a sample and a pt assistance form at [facility] for repatha I also made sure no pa required and pt voiced understanding I also added her to our spreadsheet for [id number] so I can manage expiration dates and pt assistance

## 2021-07-15 NOTE — Telephone Encounter (Signed)
Pt requesting samples of repatha however they haven't seen a pharmd and I do not know status of pa or anything. Routing to pharmd pool.

## 2021-07-21 DIAGNOSIS — L570 Actinic keratosis: Secondary | ICD-10-CM | POA: Diagnosis not present

## 2021-07-21 DIAGNOSIS — C44629 Squamous cell carcinoma of skin of left upper limb, including shoulder: Secondary | ICD-10-CM | POA: Diagnosis not present

## 2021-08-12 DIAGNOSIS — R944 Abnormal results of kidney function studies: Secondary | ICD-10-CM | POA: Diagnosis not present

## 2021-08-12 DIAGNOSIS — J984 Other disorders of lung: Secondary | ICD-10-CM | POA: Diagnosis not present

## 2021-08-12 DIAGNOSIS — E875 Hyperkalemia: Secondary | ICD-10-CM | POA: Diagnosis not present

## 2021-08-12 LAB — CUP PACEART REMOTE DEVICE CHECK
Battery Remaining Longevity: 124 mo
Battery Voltage: 3.02 V
Brady Statistic AP VP Percent: 0.06 %
Brady Statistic AP VS Percent: 48.72 %
Brady Statistic AS VP Percent: 0.06 %
Brady Statistic AS VS Percent: 51.16 %
Brady Statistic RA Percent Paced: 48.86 %
Brady Statistic RV Percent Paced: 0.12 %
Date Time Interrogation Session: 20221013061242
Implantable Lead Implant Date: 20100309
Implantable Lead Implant Date: 20100309
Implantable Lead Location: 753859
Implantable Lead Location: 753860
Implantable Lead Model: 5076
Implantable Lead Model: 5076
Implantable Pulse Generator Implant Date: 20190327
Lead Channel Impedance Value: 342 Ohm
Lead Channel Impedance Value: 399 Ohm
Lead Channel Impedance Value: 722 Ohm
Lead Channel Impedance Value: 760 Ohm
Lead Channel Pacing Threshold Amplitude: 0.5 V
Lead Channel Pacing Threshold Amplitude: 0.625 V
Lead Channel Pacing Threshold Pulse Width: 0.4 ms
Lead Channel Pacing Threshold Pulse Width: 0.4 ms
Lead Channel Sensing Intrinsic Amplitude: 2.625 mV
Lead Channel Sensing Intrinsic Amplitude: 2.625 mV
Lead Channel Sensing Intrinsic Amplitude: 9.875 mV
Lead Channel Sensing Intrinsic Amplitude: 9.875 mV
Lead Channel Setting Pacing Amplitude: 2 V
Lead Channel Setting Pacing Amplitude: 2.5 V
Lead Channel Setting Pacing Pulse Width: 0.4 ms
Lead Channel Setting Sensing Sensitivity: 1.2 mV

## 2021-08-30 DIAGNOSIS — E039 Hypothyroidism, unspecified: Secondary | ICD-10-CM | POA: Diagnosis not present

## 2021-08-30 DIAGNOSIS — E875 Hyperkalemia: Secondary | ICD-10-CM | POA: Diagnosis not present

## 2021-08-30 DIAGNOSIS — J984 Other disorders of lung: Secondary | ICD-10-CM | POA: Diagnosis not present

## 2021-08-30 DIAGNOSIS — E785 Hyperlipidemia, unspecified: Secondary | ICD-10-CM | POA: Diagnosis not present

## 2021-08-30 DIAGNOSIS — I2510 Atherosclerotic heart disease of native coronary artery without angina pectoris: Secondary | ICD-10-CM | POA: Diagnosis not present

## 2021-08-30 DIAGNOSIS — I480 Paroxysmal atrial fibrillation: Secondary | ICD-10-CM | POA: Diagnosis not present

## 2021-09-06 DIAGNOSIS — Z20828 Contact with and (suspected) exposure to other viral communicable diseases: Secondary | ICD-10-CM | POA: Diagnosis not present

## 2021-09-06 DIAGNOSIS — C44629 Squamous cell carcinoma of skin of left upper limb, including shoulder: Secondary | ICD-10-CM | POA: Diagnosis not present

## 2021-09-06 DIAGNOSIS — Z23 Encounter for immunization: Secondary | ICD-10-CM | POA: Diagnosis not present

## 2021-09-29 DIAGNOSIS — E039 Hypothyroidism, unspecified: Secondary | ICD-10-CM | POA: Diagnosis not present

## 2021-09-29 DIAGNOSIS — E785 Hyperlipidemia, unspecified: Secondary | ICD-10-CM | POA: Diagnosis not present

## 2021-09-29 DIAGNOSIS — I2510 Atherosclerotic heart disease of native coronary artery without angina pectoris: Secondary | ICD-10-CM | POA: Diagnosis not present

## 2021-09-29 DIAGNOSIS — E875 Hyperkalemia: Secondary | ICD-10-CM | POA: Diagnosis not present

## 2021-09-29 DIAGNOSIS — I480 Paroxysmal atrial fibrillation: Secondary | ICD-10-CM | POA: Diagnosis not present

## 2021-10-01 ENCOUNTER — Ambulatory Visit: Payer: Medicare Other | Admitting: Gastroenterology

## 2021-11-04 DIAGNOSIS — H524 Presbyopia: Secondary | ICD-10-CM | POA: Diagnosis not present

## 2021-11-04 DIAGNOSIS — H35371 Puckering of macula, right eye: Secondary | ICD-10-CM | POA: Diagnosis not present

## 2021-11-04 DIAGNOSIS — H34232 Retinal artery branch occlusion, left eye: Secondary | ICD-10-CM | POA: Diagnosis not present

## 2021-11-04 DIAGNOSIS — H5213 Myopia, bilateral: Secondary | ICD-10-CM | POA: Diagnosis not present

## 2021-11-04 DIAGNOSIS — H40013 Open angle with borderline findings, low risk, bilateral: Secondary | ICD-10-CM | POA: Diagnosis not present

## 2021-11-04 DIAGNOSIS — H52223 Regular astigmatism, bilateral: Secondary | ICD-10-CM | POA: Diagnosis not present

## 2021-11-04 DIAGNOSIS — H04123 Dry eye syndrome of bilateral lacrimal glands: Secondary | ICD-10-CM | POA: Diagnosis not present

## 2021-11-11 ENCOUNTER — Ambulatory Visit (INDEPENDENT_AMBULATORY_CARE_PROVIDER_SITE_OTHER): Payer: Medicare Other

## 2021-11-11 DIAGNOSIS — I495 Sick sinus syndrome: Secondary | ICD-10-CM

## 2021-11-11 LAB — CUP PACEART REMOTE DEVICE CHECK
Battery Remaining Longevity: 122 mo
Battery Voltage: 3.02 V
Brady Statistic AP VP Percent: 0.03 %
Brady Statistic AP VS Percent: 46.31 %
Brady Statistic AS VP Percent: 0.06 %
Brady Statistic AS VS Percent: 53.6 %
Brady Statistic RA Percent Paced: 46.41 %
Brady Statistic RV Percent Paced: 0.09 %
Date Time Interrogation Session: 20230111213306
Implantable Lead Implant Date: 20100309
Implantable Lead Implant Date: 20100309
Implantable Lead Location: 753859
Implantable Lead Location: 753860
Implantable Lead Model: 5076
Implantable Lead Model: 5076
Implantable Pulse Generator Implant Date: 20190327
Lead Channel Impedance Value: 361 Ohm
Lead Channel Impedance Value: 399 Ohm
Lead Channel Impedance Value: 798 Ohm
Lead Channel Impedance Value: 817 Ohm
Lead Channel Pacing Threshold Amplitude: 0.625 V
Lead Channel Pacing Threshold Amplitude: 0.625 V
Lead Channel Pacing Threshold Pulse Width: 0.4 ms
Lead Channel Pacing Threshold Pulse Width: 0.4 ms
Lead Channel Sensing Intrinsic Amplitude: 17.125 mV
Lead Channel Sensing Intrinsic Amplitude: 17.125 mV
Lead Channel Sensing Intrinsic Amplitude: 3.375 mV
Lead Channel Sensing Intrinsic Amplitude: 3.375 mV
Lead Channel Setting Pacing Amplitude: 2 V
Lead Channel Setting Pacing Amplitude: 2.5 V
Lead Channel Setting Pacing Pulse Width: 0.4 ms
Lead Channel Setting Sensing Sensitivity: 1.2 mV

## 2021-11-15 ENCOUNTER — Telehealth: Payer: Self-pay | Admitting: Cardiovascular Disease

## 2021-11-15 NOTE — Telephone Encounter (Signed)
Returned the call to the patient's daughter per the dpr. The patient has gotten to where she cannot really ambulate anymore without getting out of breath. She used to use a walker but now has to sit in a wheelchair the majority of the time. The patient's daughter wanted to have the opinion of Dr. Sallyanne Kuster to see how much she should push her to be active. She does not feel comfortable speaking to her PCP about this.  She stated that the shortness of breath has gotten worse over the last two weeks. She does not feel like the patient has edema and she does not have shortness of breath when lying down. It is only when she exerts herself. The patient is also no longer able to dress or bath herself.   Blood pressure and heart rate today was 139/77 and 80  She does feel like the patient may also have some pain and has been advised to speak to her PCP about this.  She has a follow up in April with Dr. Sallyanne Kuster. She declined a sooner appointment.

## 2021-11-15 NOTE — Telephone Encounter (Signed)
Pts daughter would like to speak with the nurse in regards to this pt... has some questions to ask... please advise

## 2021-11-23 NOTE — Progress Notes (Signed)
Remote pacemaker transmission.   

## 2021-12-02 DIAGNOSIS — R944 Abnormal results of kidney function studies: Secondary | ICD-10-CM | POA: Diagnosis not present

## 2021-12-02 DIAGNOSIS — K219 Gastro-esophageal reflux disease without esophagitis: Secondary | ICD-10-CM | POA: Diagnosis not present

## 2021-12-02 DIAGNOSIS — E875 Hyperkalemia: Secondary | ICD-10-CM | POA: Diagnosis not present

## 2021-12-02 DIAGNOSIS — E785 Hyperlipidemia, unspecified: Secondary | ICD-10-CM | POA: Diagnosis not present

## 2021-12-02 DIAGNOSIS — E039 Hypothyroidism, unspecified: Secondary | ICD-10-CM | POA: Diagnosis not present

## 2021-12-02 DIAGNOSIS — J984 Other disorders of lung: Secondary | ICD-10-CM | POA: Diagnosis not present

## 2021-12-07 DIAGNOSIS — L570 Actinic keratosis: Secondary | ICD-10-CM | POA: Diagnosis not present

## 2021-12-07 DIAGNOSIS — C44622 Squamous cell carcinoma of skin of right upper limb, including shoulder: Secondary | ICD-10-CM | POA: Diagnosis not present

## 2021-12-07 DIAGNOSIS — D485 Neoplasm of uncertain behavior of skin: Secondary | ICD-10-CM | POA: Diagnosis not present

## 2021-12-09 ENCOUNTER — Telehealth: Payer: Self-pay | Admitting: Cardiovascular Disease

## 2021-12-09 DIAGNOSIS — M545 Low back pain, unspecified: Secondary | ICD-10-CM | POA: Diagnosis not present

## 2021-12-09 DIAGNOSIS — I2510 Atherosclerotic heart disease of native coronary artery without angina pectoris: Secondary | ICD-10-CM | POA: Diagnosis not present

## 2021-12-09 DIAGNOSIS — I350 Nonrheumatic aortic (valve) stenosis: Secondary | ICD-10-CM

## 2021-12-09 DIAGNOSIS — Z1331 Encounter for screening for depression: Secondary | ICD-10-CM | POA: Diagnosis not present

## 2021-12-09 DIAGNOSIS — J984 Other disorders of lung: Secondary | ICD-10-CM | POA: Diagnosis not present

## 2021-12-09 DIAGNOSIS — R0602 Shortness of breath: Secondary | ICD-10-CM

## 2021-12-09 DIAGNOSIS — I252 Old myocardial infarction: Secondary | ICD-10-CM | POA: Diagnosis not present

## 2021-12-09 DIAGNOSIS — Z23 Encounter for immunization: Secondary | ICD-10-CM | POA: Diagnosis not present

## 2021-12-09 DIAGNOSIS — Z1389 Encounter for screening for other disorder: Secondary | ICD-10-CM | POA: Diagnosis not present

## 2021-12-09 DIAGNOSIS — E039 Hypothyroidism, unspecified: Secondary | ICD-10-CM | POA: Diagnosis not present

## 2021-12-09 NOTE — Telephone Encounter (Signed)
The best thing to do is recheck an echocardiogram and draw a BNP/BMET to see if dyspnea is heart related. Would like to get this before her 02/20 appt if possible. The murmur is not surprising - she has known aortic stenosis, albeit mild by echo a year ago. This is unlikely to progress in just one year, but worth getting another echo to make sure we did not underestimate it last year

## 2021-12-09 NOTE — Telephone Encounter (Signed)
Orders placed for echo, labs  Daughter made aware  She expressed concern that a test she had done last year was painful. Explained that echo does involve a probe that presses against chest wall, but this is how heart function/valves are assessed. She said patient will "try" to have this done  Aware she will get a call to set up echo  Aware no appt needed for non-fasting labs at [facility]

## 2021-12-09 NOTE — Telephone Encounter (Signed)
Pt c/o Shortness Of Breath: STAT if SOB developed within the last 24 hours or pt is noticeably SOB on the phone  1. Are you currently SOB (can you hear that pt is SOB on the phone)? yes  2. How long have you been experiencing SOB? One month  3. Are you SOB when sitting or when up moving around? Moving around  4. Are you currently experiencing any other symptoms? no

## 2021-12-09 NOTE — Telephone Encounter (Signed)
Spoke with patient's daughter about Orlena's shortness of breath  Denies weight gain, denies swelling  Patient went to PCP today -- metoprolol changed from tartrate to succinate per daughter (same dose per daughter but she did not provide this) -- our list indicates meto succ already  -- she notices afib more when she gets up  -- PCP said she had a heart murmur  She would like to get patient in sooner  She already had an April 2023 visit scheduled Operator scheduled her for a May 2023 visit also   Moved her April 2023 device appt to Feb 20 @ 8:20am -- cancel other appts if not needed Advised to monitor daily weights and for swelling   Routed to MD/RN as Juluis Rainier

## 2021-12-14 DIAGNOSIS — I480 Paroxysmal atrial fibrillation: Secondary | ICD-10-CM | POA: Diagnosis not present

## 2021-12-14 DIAGNOSIS — E039 Hypothyroidism, unspecified: Secondary | ICD-10-CM | POA: Diagnosis not present

## 2021-12-14 DIAGNOSIS — F17210 Nicotine dependence, cigarettes, uncomplicated: Secondary | ICD-10-CM | POA: Diagnosis not present

## 2021-12-14 DIAGNOSIS — M545 Low back pain, unspecified: Secondary | ICD-10-CM | POA: Diagnosis not present

## 2021-12-14 DIAGNOSIS — M109 Gout, unspecified: Secondary | ICD-10-CM | POA: Diagnosis not present

## 2021-12-14 DIAGNOSIS — I2510 Atherosclerotic heart disease of native coronary artery without angina pectoris: Secondary | ICD-10-CM | POA: Diagnosis not present

## 2021-12-14 DIAGNOSIS — E559 Vitamin D deficiency, unspecified: Secondary | ICD-10-CM | POA: Diagnosis not present

## 2021-12-14 DIAGNOSIS — J984 Other disorders of lung: Secondary | ICD-10-CM | POA: Diagnosis not present

## 2021-12-16 DIAGNOSIS — I350 Nonrheumatic aortic (valve) stenosis: Secondary | ICD-10-CM | POA: Diagnosis not present

## 2021-12-16 DIAGNOSIS — R0602 Shortness of breath: Secondary | ICD-10-CM | POA: Diagnosis not present

## 2021-12-17 ENCOUNTER — Ambulatory Visit (HOSPITAL_COMMUNITY)
Admission: RE | Admit: 2021-12-17 | Discharge: 2021-12-17 | Disposition: A | Discharge status: Home / Self Care | Payer: Medicare Other | Source: Ambulatory Visit | Attending: Cardiovascular Disease | Admitting: Cardiovascular Disease

## 2021-12-17 DIAGNOSIS — I350 Nonrheumatic aortic (valve) stenosis: Secondary | ICD-10-CM | POA: Diagnosis not present

## 2021-12-17 DIAGNOSIS — R0602 Shortness of breath: Secondary | ICD-10-CM | POA: Diagnosis not present

## 2021-12-17 LAB — BASIC METABOLIC PANEL
BUN/Creatinine Ratio: 17 (ref 12–28)
BUN: 28 mg/dL (ref 10–36)
CO2: 28 mmol/L (ref 20–29)
Calcium: 9.7 mg/dL (ref 8.7–10.3)
Chloride: 99 mmol/L (ref 96–106)
Creatinine, Ser: 1.69 mg/dL — ABNORMAL HIGH (ref 0.57–1.00)
Glucose: 86 mg/dL (ref 70–99)
Potassium: 4.4 mmol/L (ref 3.5–5.2)
Sodium: 144 mmol/L (ref 134–144)
eGFR: 27 mL/min/{1.73_m2} — ABNORMAL LOW (ref >59)

## 2021-12-17 LAB — ECHOCARDIOGRAM COMPLETE
AR max vel: 1.12 cm2
AV Area VTI: 0.9 cm2
AV Area mean vel: 1.34 cm2
AV Mean grad: 11.5 mmHg
AV Peak grad: 22.5 mmHg
Ao pk vel: 2.37 m/s
Area-P 1/2: 2.87 cm2
S' Lateral: 2.4 cm

## 2021-12-17 LAB — BRAIN NATRIURETIC PEPTIDE: BNP: 247.1 pg/mL — ABNORMAL HIGH (ref 0.0–100.0)

## 2021-12-17 NOTE — Progress Notes (Signed)
*  PRELIMINARY RESULTS* Echocardiogram 2D Echocardiogram has been performed.  Amy [name] 12/17/2021, 12:47 PM

## 2021-12-20 ENCOUNTER — Ambulatory Visit (INDEPENDENT_AMBULATORY_CARE_PROVIDER_SITE_OTHER): Payer: Medicare Other | Admitting: Cardiovascular Disease

## 2021-12-20 ENCOUNTER — Encounter: Payer: Self-pay | Admitting: Cardiovascular Disease

## 2021-12-20 ENCOUNTER — Other Ambulatory Visit: Payer: Self-pay

## 2021-12-20 VITALS — BP 162/80 | HR 71 | Ht 66.0 in | Wt 175.4 lb

## 2021-12-20 DIAGNOSIS — E7801 Familial hypercholesterolemia: Secondary | ICD-10-CM

## 2021-12-20 DIAGNOSIS — N184 Chronic kidney disease, stage 4 (severe): Secondary | ICD-10-CM | POA: Diagnosis not present

## 2021-12-20 DIAGNOSIS — Z950 Presence of cardiac pacemaker: Secondary | ICD-10-CM

## 2021-12-20 DIAGNOSIS — D6869 Other thrombophilia: Secondary | ICD-10-CM

## 2021-12-20 DIAGNOSIS — I350 Nonrheumatic aortic (valve) stenosis: Secondary | ICD-10-CM

## 2021-12-20 DIAGNOSIS — I5032 Chronic diastolic (congestive) heart failure: Secondary | ICD-10-CM

## 2021-12-20 DIAGNOSIS — I6522 Occlusion and stenosis of left carotid artery: Secondary | ICD-10-CM | POA: Diagnosis not present

## 2021-12-20 DIAGNOSIS — I495 Sick sinus syndrome: Secondary | ICD-10-CM

## 2021-12-20 DIAGNOSIS — I480 Paroxysmal atrial fibrillation: Secondary | ICD-10-CM | POA: Diagnosis not present

## 2021-12-20 DIAGNOSIS — E790 Hyperuricemia without signs of inflammatory arthritis and tophaceous disease: Secondary | ICD-10-CM | POA: Diagnosis not present

## 2021-12-20 DIAGNOSIS — I2510 Atherosclerotic heart disease of native coronary artery without angina pectoris: Secondary | ICD-10-CM

## 2021-12-20 DIAGNOSIS — I10 Essential (primary) hypertension: Secondary | ICD-10-CM

## 2021-12-20 MED ORDER — FUROSEMIDE 40 MG PO TABS: 40.0000 mg | ORAL_TABLET | Freq: Every day | ORAL | 5 refills | Status: DC

## 2021-12-20 NOTE — Progress Notes (Signed)
Cardiology Office Note    Date:  12/24/2021   ID:  Amy [name], DOB 1922/09/11, MRN [id number]  PCP:  [facility]  Cardiologist:  Sanda Klein, MD   Electrophysiologist:  None   Evaluation Performed:  Follow-Up Visit  Chief Complaint:  dyspnea and edema  History of Present Illness:    Amy [name] is a 88 y.o. female with history of coronary artery disease and previous bypass surgery as well as sinus node dysfunction and persistent atrial arrhythmia, history of left inferior hemianopsia due to retinal artery occlusion.  She underwent a pacemaker generator change out in 2019 (Medtronic azure XT).  She has stage III chronic kidney disease and moderate carotid stenosis.  She has severe mixed hyperlipidemia intolerant of statins, on Repatha.  She has had some discomfort in her feet and wonders whether this could be gout.  She was told to reduce her allopurinol to half a tablet every other day (I suspect the prescription was based on a suspected allopurinol tablet size of 200 mg, but she is actually taking 100 mg tablets).  We will check a uric acid with her next labs.  She called a couple of weeks ago with complaints of edema and worsening shortness of breath.  Her BNP was modestly elevated at 247, higher than the baseline of 171 about a year ago.  Renal parameters were similar to her baseline with a creatinine of 1.69 (GFR just under 30).  Her echocardiogram showed preserved left ventricular systolic function and unchanged moderate aortic stenosis.  The mitral inflow pattern suggestive of impaired relaxation, E/e' was in indeterminate range (9-12).    Therefore there were equivocal findings of volume overload, but she has had some improvement with an increased dose of diuretic.  She is not taking furosemide 40 mg daily, every day.  The edema is gone.  The dyspnea has improved.  Currently NYHA functional class II for the most part.  Denies orthopnea and PND.  Has not had  chest pain, palpitations, dizziness or syncope.  Her blood pressure is high today.  Pacemaker interrogation shows no recent episodes of atrial fibrillation.  Last meaningful episode was in March 2022. In the last 10 months she has only had a cumulative 2 minutes or so of paroxysmal atrial tachycardia. She does not require ventricular pacing.  She has about 51% atrial pacing and heart rate histogram distribution is appropriate.  She had a relatively recent generator change out (2019) and has a Medtronic as you are still has 10 years of remaining longevity.  Both the atrial and ventricular leads are Medtronic 5076 leads.  She has a long AV conduction time at about 300 ms with atrial pacing.  Fairly suddenly last November she had onset of shortness of breath with minimal activity. She did not want to go to the emergency room. Her daughter has been keeping tabs on her blood pressure and it is notable that during the period of time her systolic blood pressure was low, mostly in the 90s. After about a week these blood pressures returned to previous baseline of 120s/50-60, but the dyspnea has persisted.   Most recent labs from Dr. Juel Burrow office from November 22 show fair lipid profile parameters with cholesterol 176, HDL 54, triglycerides 194.  The LDL is not at target.  She reports compliance with Repatha.  In 2009 she had an acute inferior wall ST segment elevation myocardial infarction treated with urgent angioplasty and placement of a bare-metal stent. Subsequently, she  underwent multivessel bypass surgery with LIMA to LAD, SVG to ramus SVG to RCA. She has no evidence of residual myocardial injury and has preserved left ventricular systolic function. In June 2015, cardiac catheterization showed 90% proximal and mid LAD with patent distal LIMA bypass; 40% ostial circumflex, not bypassed; unchanged 80% proximal ramus intermedius, SVG to ramus occluded; patent stent right coronary artery, SVG to right coronary  artery occluded. Her LVEF was 55%, there was no significant mitral regurgitation and the LVEDP was high normal at 15 mm Hg, (when she was short of breath). [name] stated that her dyspnea is likely multifactorial and cannot be explained completely by cardiac findings. In May 2016 she had a low risk nuclear study.    In 2010 a permanent pacemaker (Medtronic) was implanted for symptomatic bradycardia.  She underwent a generator change out in March 2019.  In late 2013 she had a fall complicated by a hip fracture and radius fracture. She had an esophageal perforation complicated by mediastinitis and had a feeding tube for several months. In October 2015 she had another fall in her home (no syncope, tripped at [facility].carpet transition) and fractured her pelvis and humerus.    She has fairly severe hyperlipidemia with an LDL cholesterol in excess of 200. She is statin, niacin, ezetimibe and resin intolerant. She takes Repatha.   She has systolic HTN, but also a tendency to marked orthostatic hypotension (25 mm Hg drop in SBP).   She has moderate CKD, baseline GFR around 30 mL/minute, has seen Dr. Lowanda Foster in the past, but does not have regular nephrology follow-up.   July 2019 she had an episode of unilateral inferior hemianopsia.  Brief episodes of atrial fibrillation were seen around that time.  There was also a moderate left internal carotid artery stenosis.  She had persistent atrial fibrillation from late July until November 2019.     Past Medical History:  Diagnosis Date   Anxiety    Arthritis    Asthma    CAD in native artery    s/p CABG x 3; Last Myoview in 07/2009 - non-ischemic; Echo 03/2012  Aortic Sclerosis with normal EF.   CKD (chronic kidney disease), stage III (HCC)    DVT (deep vein thrombosis) in pregnancy    Esophageal perforation    9/13   Fall 07/21/2014   FALL WITH INJURY   Fracture of head of humerus    Fracture of hip ([place]) 07/21/2014   LEFT   GERD  (gastroesophageal reflux disease)    Hypercholesteremia    Hypertension    Hypothyroidism    Loss of vision 05/22/2018   LEFT EYE   Mediastinitis    s/p drainage   Myocardial infarction [facility])    Inferior STEMI 07/2008 - PCI of prox RCA followed byu CABG x 3 in 9/'09   Pacemaker    PAF (paroxysmal atrial fibrillation) (HCC)    Paroxysmal atrial flutter ([place]) 11/26/2014   Pneumonia    Shortness of breath    SSS (sick sinus syndrome) ([place]) 11/26/2014   Status post hip hemiarthroplasty left hip by Dr Ronnie Derby 08/08/2012   Past Surgical History:  Procedure Laterality Date   CARDIOVERSION N/A 09/21/2018   Procedure: CARDIOVERSION;  Surgeon: Sanda Klein, MD;  Location: [place] [facility];  Service: Cardiovascular;  Laterality: N/A;   CARPAL TUNNEL RELEASE  08/08/2012   Procedure: CARPAL TUNNEL RELEASE;  Surgeon: Schuyler Amor, MD;  Location: [place];  Service: Orthopedics;  Laterality: Left;   CORONARY ARTERY BYPASS  GRAFT  2009   LIMA-LAD, SVG-RI, SVG-stented RCA   ESOPHAGOGASTRODUODENOSCOPY  08/10/2012   Procedure: ESOPHAGOGASTRODUODENOSCOPY (EGD);  Surgeon: Beryle Beams, MD;  Location: [facility] ENDOSCOPY;  Service: Endoscopy;  Laterality: N/A;   ESOPHAGOSCOPY  08/10/2012   Procedure: ESOPHAGOSCOPY;  Surgeon: [name], MD;  Location: [place];  Service: ENT;  Laterality: N/A;   GASTROSTOMY  08/16/2012   Procedure: GASTROSTOMY;  Surgeon: Zenovia Jarred, MD;  Location: [place];  Service: General;  Laterality: N/A;   HIP ARTHROPLASTY  08/08/2012   Procedure: ARTHROPLASTY BIPOLAR HIP;  Surgeon: Rudean Haskell, MD;  Location: [place];  Service: Orthopedics;  Laterality: Left;  [place]    JOINT REPLACEMENT     LEFT HEART CATHETERIZATION WITH CORONARY ANGIOGRAM N/A 04/16/2014   Procedure: LEFT HEART CATHETERIZATION WITH CORONARY ANGIOGRAM;  Surgeon: Wellington Hampshire, MD;  Location: [place] CATH LAB;  Service: Cardiovascular;  Laterality: N/A;   NM MYOCAR PERF WALL MOTION  08/14/2009   Normal   PACEMAKER  INSERTION  01/06/2009   Medtronic   PPM GENERATOR CHANGEOUT N/A 01/24/2018   Procedure: PPM GENERATOR CHANGEOUT;  Surgeon: Sanda Klein, MD;  Location: [place] CV LAB;  Service: Cardiovascular;  Laterality: N/A;   TOTAL HIP ARTHROPLASTY     WRIST FRACTURE SURGERY  07/2012   left intra articular  with carpel tunnel     Current Meds  Medication Sig   acetaminophen (TYLENOL) 500 MG tablet Take 1,000 mg by mouth every 6 (six) hours as needed for moderate pain.   Cholecalciferol (VITAMIN D3 PO) Take 1 tablet by mouth daily.   cycloSPORINE (RESTASIS) 0.05 % ophthalmic emulsion Place 1 drop into both eyes 2 (two) times daily.   ELIQUIS 2.5 MG TABS tablet Take 1 tablet by mouth twice daily   EUTHYROX 75 MCG tablet Take 75 mcg by mouth every morning.   gabapentin (NEURONTIN) 100 MG capsule Take 1 capsule (100 mg total) by mouth 2 (two) times daily.   metoprolol succinate (TOPROL-XL) 25 MG 24 hr tablet Take 1 tablet by mouth once daily   omeprazole (PRILOSEC) 40 MG capsule Take 1 capsule by mouth every morning.   REPATHA SURECLICK 703 MG/ML SOAJ Inject 140 mg as directed every 14 (fourteen) days.   TRELEGY ELLIPTA 200-62.5-25 MCG/ACT AEPB daily.   [DISCONTINUED] furosemide (LASIX) 40 MG tablet Pt take 40 mg alternating with 20 mg next day     Allergies:   Colestipol, Hydrocodone, Niacin and related, Statins, and Zetia [ezetimibe]   Social History   Tobacco Use   Smoking status: Never   Smokeless tobacco: Never  Vaping Use   Vaping Use: Never used  Substance Use Topics   Alcohol use: No   Drug use: No     Family Hx: The patient's family history includes Stroke in her father.  ROS:   Please see the history of present illness.    All other systems are reviewed and are negative.   Prior CV studies:   The following studies were reviewed today: Comprehensive remote pacemaker check today  Labs/Other Tests and Data Reviewed:    EKG: Ordered today, personally reviewed, unchanged  from previous tracings, shows atrial paced, ventricular sensed rhythm with long AV delay of 300 ms and a QS pattern in leads V1-V2.  There are no ischemic repolarization abnormalities.  Normal QT. Recent Labs: 12/16/2021: BNP 247.1; BUN 28; Creatinine, Ser 1.69; Potassium 4.4; Sodium 144   Recent Lipid Panel Lab Results  Component Value Date/Time   CHOL  193 08/13/2018 09:55 AM   TRIG 153 (H) 08/13/2018 09:55 AM   HDL 51 08/13/2018 09:55 AM   CHOLHDL 3.8 08/13/2018 09:55 AM   CHOLHDL 6.0 05/23/2018 08:56 AM   LDLCALC 111 (H) 08/13/2018 09:55 AM   Labs from 02/25/2019 show total cholesterol 192, HDL 60, LDL low not reported, triglycerides 175, hemoglobin A1c 5.9%, hemoglobin 13, creatinine 1.57, ALT 11, TSH 3.00  03/04/2020 creatinine 1.58, which is pretty much her baseline.   Hemoglobin A1c 6% Hemoglobin 12.4 TSH 4.42 Normal liver function tests total cholesterol 188, HDL 52, triglycerides 231  09/02/2020 Total cholesterol 213, HDL 53, triglycerides 62, calculated LDL 127, hemoglobin A1c 6.3%  10/01/2020 Hemoglobin 12.8, creatinine 1.48, normal liver function test, TSH 2.08  12/02/2021 Cholesterol 176, HDL 54, LDL 105, triglycerides 194 Hemoglobin 11.7, creatinine 1.61, potassium 4.7  Wt Readings from Last 3 Encounters:  12/20/21 175 lb 6.4 oz (79.6 kg)  06/14/21 178 lb (80.7 kg)  02/15/21 178 lb (80.7 kg)     Objective:    Vital Signs:  BP (!) 162/80 (BP Location: Left Arm, Patient Position: Sitting, Cuff Size: Large)    Pulse 71    Ht 5\' 6"  (1.676 m)    Wt 175 lb 6.4 oz (79.6 kg)    SpO2 94%    BMI 28.31 kg/m     General: Alert, oriented x3, no distress, appears younger than stated age Head: no evidence of trauma, PERRL, EOMI, no exophtalmos or lid lag, no myxedema, no xanthelasma; normal ears, nose and oropharynx Neck: normal jugular venous pulsations and no hepatojugular reflux; brisk carotid pulses without delay and no carotid bruits Chest: clear to auscultation, no  signs of consolidation by percussion or palpation, normal fremitus, symmetrical and full respiratory excursions Cardiovascular: normal position and quality of the apical impulse, regular rhythm, normal first and distinct second heart sounds, 2/6 aortic ejection murmur is early peaking, no diastolic murmurs, rubs or gallops Abdomen: no tenderness or distention, no masses by palpation, no abnormal pulsatility or arterial bruits, normal bowel sounds, no hepatosplenomegaly Extremities: no clubbing, cyanosis or edema; 2+ radial, ulnar and brachial pulses bilaterally; 2+ right femoral, posterior tibial and dorsalis pedis pulses; 2+ left femoral, posterior tibial and dorsalis pedis pulses; no subclavian or femoral bruits Neurological: grossly nonfocal Psych: Normal mood and affect    ASSESSMENT & PLAN:    CHF: Clinically she appears to be euvolemic today, but has had recent problems with worsening dyspnea and edema.  BNP has worsened and she seems to do better on higher dose of diuretic.  Need to maintain balance with advanced kidney disease, stage IV.  Continue furosemide 40 mg once daily.  Left ventricular systolic function remains normal.  Aortic stenosis remains moderate. CAD s/p CABG: Denies angina pectoris.  No regional wall motion abnormalities on echo.  She may have a significant coronary problem causing her dyspnea, but we are trying to be conservative with her management at age 56. AFib: She has not had any episode since March 2022.  Always well rate controlled.  On appropriate anticoagulation, CHADSVasc 8 (age 24, CVA 2, gender, HF, PAD/CAD, HTN).  Eliquis: Denies bleeding complications.  Dose adjusted for age and reduced kidney function. SSS: Normal device function.  Heart rate histogram distribution is normal and chronotropic incompetence cannot be incriminated as a cause of her dyspnea. PM: Normal device function.  Continue remote downloads every 3 months.. Original leads implanted 2010 slight  excellent parameters. Anticipated generator longevity 10 years. AS: Remains moderate by  all criteria.  She does have a borderline low stroke-volume index of 31 mL, but I do not think that she has severe low-flow low gradient aortic stenosis.  The aortic valve abnormality is unlikely to be contributing to her dyspnea, which occurs with minimal activity. HTN:  she has a history of significant and symptomatic orthostatic hypotension.  At home her blood pressure is usually lower.  Just a week ago her blood pressure was 140/80.  Did not make any changes in his medications at this time HLP: LDL is not quite at target, but probably best we can achieve.  Severe cholesterol elevation with serious atherosclerotic disease, consistent with familial heterozygous hypercholesterolemia. LDL cholesterol remains elevated, but markedly improved compared to her baseline. Intolerant to statins/ezetimibe/resins but doing well on Repatha.  No good option to add medications at this time for further LDL reduction. Left internal carotid artery stenosis: Medical management. No new neurological complaints. CKD 4: at baseline per recent labs. Monitor carefully with increased diuretics. Hyperuricemia/history of gout: No overt changes of inflammatory arthritis today.  Advised her to take allopurinol 100 mg daily.  Recheck uric acid.  Avoid nonsteroidals.  Patient Instructions  Medication Instructions:  FUROSEMIDE: Take 40 mg once daily  *If you need a refill on your cardiac medications before your next appointment, please call your pharmacy*   Lab Work: Your provider would like for you to return in 2 weeks to have the following labs drawn: BMET, BNP and Uric Acid. You do not need an appointment for the lab. Once in our office lobby there is a podium where you can sign in and ring the doorbell to alert [place] that you are here. The lab is open from 8:00 am to 4:30 pm; closed for lunch from 12:45pm-1:45pm.  If you have labs (blood  work) drawn today and your tests are completely normal, you will receive your results only by: [place] (if you have MyChart) OR A paper copy in the mail If you have any lab test that is abnormal or we need to change your treatment, we will call you to review the results.   Testing/Procedures: None ordered   Follow-Up: At [facility], you and your health needs are our priority.  As part of our continuing mission to provide you with exceptional heart care, we have created designated Provider Care Teams.  These Care Teams include your primary Cardiologist (physician) and Advanced Practice Providers (APPs -  Physician Assistants and Nurse Practitioners) who all work together to provide you with the care you need, when you need it.  We recommend signing up for the patient portal called "MyChart".  Sign up information is provided on this After Visit Summary.  MyChart is used to connect with patients for Virtual Visits (Telemedicine).  Patients are able to view lab/test results, encounter notes, upcoming appointments, etc.  Non-urgent messages can be sent to your provider as well.   To learn more about what you can do with MyChart, go to [email or web address].    Your next appointment:   4 month(s) on device day  The format for your next appointment:   In Person  Provider:   Sanda Klein, MD {    Signed, Sanda Klein, MD  12/24/2021 12:19 PM    [place]

## 2021-12-20 NOTE — Patient Instructions (Signed)
Medication Instructions:  FUROSEMIDE: Take 40 mg once daily  *If you need a refill on your cardiac medications before your next appointment, please call your pharmacy*   Lab Work: Your provider would like for you to return in 2 weeks to have the following labs drawn: BMET, BNP and Uric Acid. You do not need an appointment for the lab. Once in our office lobby there is a podium where you can sign in and ring the doorbell to alert [place] that you are here. The lab is open from 8:00 am to 4:30 pm; closed for lunch from 12:45pm-1:45pm.  If you have labs (blood work) drawn today and your tests are completely normal, you will receive your results only by: [place] (if you have MyChart) OR A paper copy in the mail If you have any lab test that is abnormal or we need to change your treatment, we will call you to review the results.   Testing/Procedures: None ordered   Follow-Up: At [facility], you and your health needs are our priority.  As part of our continuing mission to provide you with exceptional heart care, we have created designated Provider Care Teams.  These Care Teams include your primary Cardiologist (physician) and Advanced Practice Providers (APPs -  Physician Assistants and Nurse Practitioners) who all work together to provide you with the care you need, when you need it.  We recommend signing up for the patient portal called "MyChart".  Sign up information is provided on this After Visit Summary.  MyChart is used to connect with patients for Virtual Visits (Telemedicine).  Patients are able to view lab/test results, encounter notes, upcoming appointments, etc.  Non-urgent messages can be sent to your provider as well.   To learn more about what you can do with MyChart, go to [email or web address].    Your next appointment:   4 month(s) on device day  The format for your next appointment:   In Person  Provider:   Sanda Klein, MD {

## 2021-12-23 DIAGNOSIS — C44622 Squamous cell carcinoma of skin of right upper limb, including shoulder: Secondary | ICD-10-CM | POA: Diagnosis not present

## 2022-01-03 DIAGNOSIS — I5032 Chronic diastolic (congestive) heart failure: Secondary | ICD-10-CM | POA: Diagnosis not present

## 2022-01-03 DIAGNOSIS — I495 Sick sinus syndrome: Secondary | ICD-10-CM | POA: Diagnosis not present

## 2022-01-04 LAB — BASIC METABOLIC PANEL
BUN/Creatinine Ratio: 15 (ref 12–28)
BUN: 27 mg/dL (ref 10–36)
CO2: 26 mmol/L (ref 20–29)
Calcium: 9.9 mg/dL (ref 8.7–10.3)
Chloride: 99 mmol/L (ref 96–106)
Creatinine, Ser: 1.76 mg/dL — ABNORMAL HIGH (ref 0.57–1.00)
Glucose: 119 mg/dL — ABNORMAL HIGH (ref 70–99)
Potassium: 4.4 mmol/L (ref 3.5–5.2)
Sodium: 142 mmol/L (ref 134–144)
eGFR: 26 mL/min/{1.73_m2} — ABNORMAL LOW (ref >59)

## 2022-01-04 LAB — URIC ACID: Uric Acid: 7.6 mg/dL (ref 3.1–7.9)

## 2022-01-04 LAB — BRAIN NATRIURETIC PEPTIDE: BNP: 140.8 pg/mL — ABNORMAL HIGH (ref 0.0–100.0)

## 2022-01-06 ENCOUNTER — Telehealth: Payer: Self-pay | Admitting: Cardiovascular Disease

## 2022-01-06 NOTE — Telephone Encounter (Signed)
Gave daughter lab results per Dr. Sallyanne Kuster... ?The BNP, which is released as the heart failure problems worsen, is the lowest that it has been in the last year.  I do not think there is a reason to pull off additional fluid at this point. ?The kidney function tests are moderately abnormal, but similar to the baseline over the last 3 years or so.  Potassium level is normal. ?Uric acid levels are at the upper limit of normal and higher than we usually like them for patients with gout (the value was 7.6 , upper normal is 7.9 and the therapeutic target and gout is is less than 6.0).  Recommend to go ahead with taking the allopurinol 100 mg on a daily basis, as we discussed in clinic. ?Daughter asked if patient should still take lasix 40 mg daily. I told her yes, there are no changes in medications. ?She thanked me for calling. ?

## 2022-01-06 NOTE — Telephone Encounter (Signed)
Daughter of patient returning call from Lattie Haw to go over lab results  ?

## 2022-01-10 DIAGNOSIS — K641 Second degree hemorrhoids: Secondary | ICD-10-CM | POA: Diagnosis not present

## 2022-01-20 ENCOUNTER — Encounter (HOSPITAL_COMMUNITY): Payer: Self-pay | Admitting: *Deleted

## 2022-01-20 ENCOUNTER — Telehealth (INDEPENDENT_AMBULATORY_CARE_PROVIDER_SITE_OTHER): Payer: Self-pay

## 2022-01-20 ENCOUNTER — Emergency Department (HOSPITAL_COMMUNITY)
Admission: EM | Admit: 2022-01-20 | Discharge: 2022-01-20 | Disposition: A | Discharge status: Home / Self Care | Payer: Medicare Other | Attending: Emergency Medicine | Admitting: Emergency Medicine

## 2022-01-20 DIAGNOSIS — Z79899 Other long term (current) drug therapy: Secondary | ICD-10-CM | POA: Diagnosis not present

## 2022-01-20 DIAGNOSIS — N189 Chronic kidney disease, unspecified: Secondary | ICD-10-CM | POA: Diagnosis not present

## 2022-01-20 DIAGNOSIS — K625 Hemorrhage of anus and rectum: Secondary | ICD-10-CM | POA: Insufficient documentation

## 2022-01-20 DIAGNOSIS — K644 Residual hemorrhoidal skin tags: Secondary | ICD-10-CM | POA: Diagnosis not present

## 2022-01-20 DIAGNOSIS — Z7901 Long term (current) use of anticoagulants: Secondary | ICD-10-CM | POA: Diagnosis not present

## 2022-01-20 LAB — COMPREHENSIVE METABOLIC PANEL
ALT: 16 U/L (ref 0–44)
AST: 26 U/L (ref 15–41)
Albumin: 3.7 g/dL (ref 3.5–5.0)
Alkaline Phosphatase: 95 U/L (ref 38–126)
Anion gap: 10 (ref 5–15)
BUN: 28 mg/dL — ABNORMAL HIGH (ref 8–23)
CO2: 30 mmol/L (ref 22–32)
Calcium: 9.4 mg/dL (ref 8.9–10.3)
Chloride: 96 mmol/L — ABNORMAL LOW (ref 98–111)
Creatinine, Ser: 1.78 mg/dL — ABNORMAL HIGH (ref 0.44–1.00)
GFR, Estimated: 25 mL/min — ABNORMAL LOW (ref >60)
Glucose, Bld: 128 mg/dL — ABNORMAL HIGH (ref 70–99)
Potassium: 3.7 mmol/L (ref 3.5–5.1)
Sodium: 136 mmol/L (ref 135–145)
Total Bilirubin: 0.4 mg/dL (ref 0.3–1.2)
Total Protein: 7.2 g/dL (ref 6.5–8.1)

## 2022-01-20 LAB — CBC
HCT: 37.5 % (ref 36.0–46.0)
Hemoglobin: 11.7 g/dL — ABNORMAL LOW (ref 12.0–15.0)
MCH: 29.2 pg (ref 26.0–34.0)
MCHC: 31.2 g/dL (ref 30.0–36.0)
MCV: 93.5 fL (ref 80.0–100.0)
Platelets: 231 10*3/uL (ref 150–400)
RBC: 4.01 MIL/uL (ref 3.87–5.11)
RDW: 15.6 % — ABNORMAL HIGH (ref 11.5–15.5)
WBC: 7.5 10*3/uL (ref 4.0–10.5)
nRBC: 0 % (ref 0.0–0.2)

## 2022-01-20 LAB — PROTIME-INR
INR: 1.1 (ref 0.8–1.2)
Prothrombin Time: 13.7 seconds (ref 11.4–15.2)

## 2022-01-20 LAB — TYPE AND SCREEN
ABO/RH(D): O NEG
Antibody Screen: NEGATIVE

## 2022-01-20 LAB — POC OCCULT BLOOD, ED: Fecal Occult Bld: POSITIVE — AB

## 2022-01-20 MED ORDER — ACETAMINOPHEN 325 MG PO TABS
650.0000 mg | ORAL_TABLET | Freq: Once | ORAL | Status: AC
  Administered 2022-01-20: 650 mg via ORAL
  Filled 2022-01-20: qty 2

## 2022-01-20 NOTE — Telephone Encounter (Signed)
Possibly related to recurrent hemorrhoidal bleeding.  Agree with evaluation in the ER. ?

## 2022-01-20 NOTE — Discharge Instructions (Signed)
If you develop new or worsening bleeding, abdominal pain, dizziness, or any other new/concerning symptoms then return to the ER for evaluation. Otherwise call your Doctor in the morning ?

## 2022-01-20 NOTE — ED Provider Notes (Signed)
?[facility] ?Provider Note ? ? ?CSN: [id number] ?Arrival date & time: 01/20/22  1642 ? ?  ? ?History ? ?Chief Complaint  ?Patient presents with  ? Rectal Bleeding  ? ? ?Amy [name] is a 88 y.o. female. ? ?HPI ?88 year old female with a history of atrial flutter on Eliquis as well as CKD, GERD, sick sinus syndrome and other chronic medical issues presents with rectal bleeding.  History is from patient and family.  Family notes that patient bled through her underwear with rectal bleeding 2 days ago after 2 large episodes of bleeding with clots.  Today patient had recurrent blood while having a bowel movement though after that she has had no further blood.  She reports her abdomen was sore immediately after the bowel movement today but then ever since has been fine.  Does not feel dizzy or lightheaded.  No chest pain.  Has not had a colonoscopy in many years.  She follows with general surgery due to multiple external hemorrhoids. ? ?Home Medications ?Prior to Admission medications   ?Medication Sig Start Date End Date Taking? Authorizing Provider  ?acetaminophen (TYLENOL) 500 MG tablet Take 1,000 mg by mouth every 6 (six) hours as needed for moderate pain.    [provider]  ?albuterol (PROVENTIL HFA;VENTOLIN HFA) 108 (90 Base) MCG/ACT inhaler Inhale 2 puffs into the lungs every 6 (six) hours as needed for wheezing or shortness of breath.  ?Patient not taking: Reported on 12/20/2021    [provider]  ?allopurinol (ZYLOPRIM) 100 MG tablet Take 100 mg by mouth daily. ?Patient not taking: Reported on 12/20/2021 05/10/18   [provider]  ?calcium carbonate (TUMS - DOSED IN MG ELEMENTAL CALCIUM) 500 MG chewable tablet Chew 2 tablets by mouth daily as needed for indigestion or heartburn. ?Patient not taking: Reported on 12/20/2021    [provider]  ?cetirizine (ZYRTEC) 10 MG tablet Take 10 mg by mouth daily. ?Patient not taking: Reported on 12/20/2021    [provider]  ?Cholecalciferol (VITAMIN D3 PO) Take 1 tablet by mouth daily.    [provider]  ?cycloSPORINE (RESTASIS) 0.05 % ophthalmic emulsion Place 1 drop into both eyes 2 (two) times daily.    [provider]  ?docusate sodium (COLACE) 100 MG capsule Take 100 mg by mouth 2 (two) times daily. ?Patient not taking: Reported on 12/20/2021    [provider]  ?ELIQUIS 2.5 MG TABS tablet Take 1 tablet by mouth twice daily 10/28/19   Croitoru, Mihai, MD  ?EUTHYROX 75 MCG tablet Take 75 mcg by mouth every morning. 02/07/21   [provider]  ?furosemide (LASIX) 40 MG tablet Take 1 tablet (40 mg total) by mouth daily. 12/20/21   Croitoru, Mihai, MD  ?gabapentin (NEURONTIN) 100 MG capsule Take 1 capsule (100 mg total) by mouth 2 (two) times daily. 05/19/20   Croitoru, Mihai, MD  ?ibuprofen (ADVIL) 200 MG tablet Take 200 mg by mouth. As needed ?Patient not taking: Reported on 12/20/2021    [provider]  ?LORazepam (ATIVAN) 0.5 MG tablet Take 0.5-1 mg by mouth at bedtime as needed. ?Patient not taking: Reported on 12/20/2021 12/24/20   [provider]  ?metoprolol succinate (TOPROL-XL) 25 MG 24 hr tablet Take 1 tablet by mouth once daily 08/21/20   Croitoru, Mihai, MD  ?nitroGLYCERIN (NITROSTAT) 0.4 MG SL tablet Place 1 tablet (0.4 mg total) under the tongue every 5 (five) minutes x 3 doses as needed for chest pain. ?Patient not  taking: Reported on 12/20/2021 04/17/14   Barrett, Evelene Croon, PA-C  ?omeprazole (PRILOSEC) 40 MG capsule Take 1 capsule by mouth every morning. 11/01/20   [provider]  ?potassium chloride (KLOR-CON) 10 MEQ tablet Take 10 mEq by mouth 2 (two) times daily. ?Patient not taking: Reported on 12/20/2021    [provider]  ?Probiotic CAPS Take 1 capsule by mouth daily. ?Patient not taking: Reported on 12/20/2021    [provider]  ?REPATHA SURECLICK 160 MG/ML SOAJ Inject 140 mg as directed every 14 (fourteen) days. 07/15/21    Croitoru, Dani Gobble, MD  ?Donnal Debar 200-62.5-25 MCG/ACT AEPB daily. 10/28/21   [provider]  ?   ? ?Allergies    ?Colestipol, Hydrocodone, Niacin and related, Statins, and Zetia [ezetimibe]   ? ?Review of Systems   ?Review of Systems  ?Cardiovascular:  Negative for chest pain.  ?Gastrointestinal:  Positive for abdominal pain (transient, gone now) and blood in stool. Negative for diarrhea and vomiting.  ?Neurological:  Negative for light-headedness.  ? ?Physical Exam ?Updated Vital Signs ?BP 139/61   Pulse 61   Temp 98.1 ?F (36.7 ?C) (Oral)   Resp 14   Ht '5\' 6"'$  (1.676 m)   Wt 78.5 kg   SpO2 95%   BMI 27.92 kg/m?  ?Physical Exam ?Vitals and nursing note reviewed. Exam conducted with a chaperone present.  ?Constitutional:   ?   General: She is not in acute distress. ?   Appearance: She is well-developed. She is not ill-appearing or diaphoretic.  ?HENT:  ?   Head: Normocephalic and atraumatic.  ?Cardiovascular:  ?   Rate and Rhythm: Normal rate and regular rhythm.  ?   Heart sounds: Murmur heard.  ?Pulmonary:  ?   Effort: Pulmonary effort is normal.  ?   Breath sounds: Normal breath sounds.  ?Abdominal:  ?   General: There is no distension.  ?   Palpations: Abdomen is soft.  ?   Tenderness: There is no abdominal tenderness.  ?Genitourinary: ?   Comments: Multiple external hemorrhoids. No acute bleeding. Minimal to no stool on DRE. Hemoccult positive but no gross blood.  ?Skin: ?   General: Skin is warm and dry.  ?Neurological:  ?   Mental Status: She is alert.  ? ? ?ED Results / Procedures / Treatments   ?Labs ?(all labs ordered are listed, but only abnormal results are displayed) ?Labs Reviewed  ?COMPREHENSIVE METABOLIC PANEL - Abnormal; Notable for the following components:  ?    Result Value  ? Chloride 96 (*)   ? Glucose, Bld 128 (*)   ? BUN 28 (*)   ? Creatinine, Ser 1.78 (*)   ? GFR, Estimated 25 (*)   ? All other components within normal limits  ?CBC - Abnormal; Notable for the following  components:  ? Hemoglobin 11.7 (*)   ? RDW 15.6 (*)   ? All other components within normal limits  ?POC OCCULT BLOOD, ED - Abnormal; Notable for the following components:  ? Fecal Occult Bld POSITIVE (*)   ? All other components within normal limits  ?PROTIME-INR  ?TYPE AND SCREEN  ? ? ?EKG ?None ? ?Radiology ?No results found. ? ?Procedures ?Procedures  ? ? ?Medications Ordered in ED ?Medications  ?acetaminophen (TYLENOL) tablet 650 mg (650 mg Oral Given 01/20/22 1847)  ? ? ?ED Course/ Medical Decision Making/ A&P ?  ?                        ?  Medical Decision Making ?Amount and/or Complexity of Data Reviewed ?Labs: ordered. ? ?Risk ?OTC drugs. ? ? ?Patient has not had any bleeding while in the emergency department.  Vital signs are unremarkable.  Her Hemoccult testing was positive though there was no gross blood.  Discussed options with patient and daughter at the bedside.  We discussed possibilities including admission/observation, CT scan imaging or even colonoscopy if warranted.  She would not want to be admitted and she would not want any further evaluation and even if she had something like cancer she would not want it to be treated at her age.  Her hemoglobin on lab testing today is mildly low compared to baseline but at 11.7 she does not appear to need a blood transfusion.  Is been many hours since her episode of bleeding today which was only 1 time.  She has known extensive hemorrhoids but no obvious bleeding or thrombosis now.  At this point, I think is reasonable to discharge per her wishes and have her follow-up with her outpatient consultants such as general surgery.  Given return precautions. ? ? ? ? ? ? ? ?Final Clinical Impression(s) / ED Diagnoses ?Final diagnoses:  ?Rectal bleeding  ? ? ?Rx / DC Orders ?ED Discharge Orders   ? ? None  ? ?  ? ? ?  ?Sherwood Gambler, MD ?01/20/22 2317 ? ?

## 2022-01-20 NOTE — Telephone Encounter (Signed)
? ?  NOTE FROM DR. Cathey: Telephone Encounter - Amy Edman, LPN - 83/15/1761 6:07 PM EDT ?Formatting of this note might be different from the original. ?Patients daughter called this morning stated that her mother was passing blood clots when she had a bowel movement on Tuesday and it was bleeding through her pants, was better on Wed just a little blood on the toilet paper and then was passing clots again today. Has been using the Layne's hemorrhoid compound that was prescribed not helping, taking the Metamucil and drinking plenty of fluids, feels weak, stomach hurts when she lays down. Per Dr. Ladona Horns she needs to go to the ER for evaluation, also needs to go to [facility] ER because it sounds like she needs to be evaluated by GI. I called patients daughter Amy [name] let her know this and she voiced understanding. ?Electronically signed by Amy Edman, LPN at 37/07/6268 4:85 PM EDT ? ?Patient daughter called the office and showed up here today asking what she should do regarding her mother. I advised the daughter what Dr. Tye Maryland had suggested that she needed to go to [place] to be evaluated there by GI. Patient daughter states she misunderstood them and she will take her mother now over to [place].  ?

## 2022-01-20 NOTE — ED Notes (Signed)
Purwick was placed @ 1700 by this NT  ? ?

## 2022-01-20 NOTE — ED Triage Notes (Signed)
States she filled the commode with blood x 2 this past week ?

## 2022-01-21 DIAGNOSIS — K921 Melena: Secondary | ICD-10-CM | POA: Diagnosis not present

## 2022-01-21 DIAGNOSIS — F17210 Nicotine dependence, cigarettes, uncomplicated: Secondary | ICD-10-CM | POA: Diagnosis not present

## 2022-01-28 DIAGNOSIS — E039 Hypothyroidism, unspecified: Secondary | ICD-10-CM | POA: Diagnosis not present

## 2022-01-28 DIAGNOSIS — E785 Hyperlipidemia, unspecified: Secondary | ICD-10-CM | POA: Diagnosis not present

## 2022-02-05 ENCOUNTER — Emergency Department (HOSPITAL_COMMUNITY)
Admission: EM | Admit: 2022-02-05 | Discharge: 2022-02-05 | Disposition: A | Discharge status: Home / Self Care | Payer: Medicare Other | Attending: Emergency Medicine | Admitting: Emergency Medicine

## 2022-02-05 ENCOUNTER — Other Ambulatory Visit: Payer: Self-pay

## 2022-02-05 ENCOUNTER — Encounter (HOSPITAL_COMMUNITY): Payer: Self-pay

## 2022-02-05 DIAGNOSIS — R0689 Other abnormalities of breathing: Secondary | ICD-10-CM | POA: Diagnosis not present

## 2022-02-05 DIAGNOSIS — R4020 Unspecified coma: Secondary | ICD-10-CM | POA: Diagnosis not present

## 2022-02-05 DIAGNOSIS — R7989 Other specified abnormal findings of blood chemistry: Secondary | ICD-10-CM | POA: Insufficient documentation

## 2022-02-05 DIAGNOSIS — I959 Hypotension, unspecified: Secondary | ICD-10-CM | POA: Diagnosis not present

## 2022-02-05 DIAGNOSIS — R531 Weakness: Secondary | ICD-10-CM | POA: Diagnosis not present

## 2022-02-05 DIAGNOSIS — Z7901 Long term (current) use of anticoagulants: Secondary | ICD-10-CM | POA: Diagnosis not present

## 2022-02-05 DIAGNOSIS — R55 Syncope and collapse: Secondary | ICD-10-CM | POA: Diagnosis not present

## 2022-02-05 DIAGNOSIS — K625 Hemorrhage of anus and rectum: Secondary | ICD-10-CM | POA: Diagnosis not present

## 2022-02-05 LAB — URINALYSIS, ROUTINE W REFLEX MICROSCOPIC
Bilirubin Urine: NEGATIVE
Glucose, UA: NEGATIVE mg/dL
Hgb urine dipstick: NEGATIVE
Ketones, ur: NEGATIVE mg/dL
Leukocytes,Ua: NEGATIVE
Nitrite: NEGATIVE
Protein, ur: NEGATIVE mg/dL
Specific Gravity, Urine: 1.006 (ref 1.005–1.030)
pH: 7 (ref 5.0–8.0)

## 2022-02-05 LAB — CBC WITH DIFFERENTIAL/PLATELET
Abs Immature Granulocytes: 0.04 10*3/uL (ref 0.00–0.07)
Basophils Absolute: 0.1 10*3/uL (ref 0.0–0.1)
Basophils Relative: 1 %
Eosinophils Absolute: 0.5 10*3/uL (ref 0.0–0.5)
Eosinophils Relative: 7 %
HCT: 36.3 % (ref 36.0–46.0)
Hemoglobin: 11.3 g/dL — ABNORMAL LOW (ref 12.0–15.0)
Immature Granulocytes: 1 %
Lymphocytes Relative: 30 %
Lymphs Abs: 2.2 10*3/uL (ref 0.7–4.0)
MCH: 28.6 pg (ref 26.0–34.0)
MCHC: 31.1 g/dL (ref 30.0–36.0)
MCV: 91.9 fL (ref 80.0–100.0)
Monocytes Absolute: 0.6 10*3/uL (ref 0.1–1.0)
Monocytes Relative: 8 %
Neutro Abs: 4.1 10*3/uL (ref 1.7–7.7)
Neutrophils Relative %: 53 %
Platelets: 248 10*3/uL (ref 150–400)
RBC: 3.95 MIL/uL (ref 3.87–5.11)
RDW: 15.6 % — ABNORMAL HIGH (ref 11.5–15.5)
WBC: 7.5 10*3/uL (ref 4.0–10.5)
nRBC: 0 % (ref 0.0–0.2)

## 2022-02-05 LAB — BASIC METABOLIC PANEL
Anion gap: 10 (ref 5–15)
BUN: 28 mg/dL — ABNORMAL HIGH (ref 8–23)
CO2: 29 mmol/L (ref 22–32)
Calcium: 9.2 mg/dL (ref 8.9–10.3)
Chloride: 100 mmol/L (ref 98–111)
Creatinine, Ser: 1.92 mg/dL — ABNORMAL HIGH (ref 0.44–1.00)
GFR, Estimated: 23 mL/min — ABNORMAL LOW (ref >60)
Glucose, Bld: 120 mg/dL — ABNORMAL HIGH (ref 70–99)
Potassium: 3.9 mmol/L (ref 3.5–5.1)
Sodium: 139 mmol/L (ref 135–145)

## 2022-02-05 MED ORDER — ACETAMINOPHEN 325 MG PO TABS
650.0000 mg | ORAL_TABLET | Freq: Once | ORAL | Status: AC
  Administered 2022-02-05: 650 mg via ORAL
  Filled 2022-02-05: qty 2

## 2022-02-05 NOTE — ED Notes (Signed)
Spoke with Judeth Porch, representative from [employer].  States that pacemaker is functioning as programmed. Recorded multiple incidents of atrial fib, most recent episode today, 4/8. Also recorded 2 instances of non sustained VT on 3/30.  ?

## 2022-02-05 NOTE — ED Triage Notes (Signed)
Pt to the ED from home with [facility] from home. ? ?Pt was found on the toilet unresponsive and hypotensive. Pt's initial BP was 59/40 and she has a pacemaker. ? ?Pt started talking and had a CBG 150. Pt was able to have a BM. ? ?Pt fell on Wednesday and has an abrasion and bruising to her right eye after a fall on Wednesday. Pt does take eliquis. ? ?

## 2022-02-05 NOTE — Discharge Instructions (Signed)
It appears that she passed out today because of low blood pressure.  The pacemaker interrogation did not show problems of arrhythmia.  For now, hold the Lasix/furosemide, for 3 days then restart either at 20 mg a day if she is still feeling weak or full dose if she is feeling vigorous.  If she begins to be dizzy, lay down immediately.  Take the blood pressure twice a day, and keep track of the number.  It would be concerning if her blood pressure is persistently less than 100.  If she has a spell of dizziness take the blood pressure.  Return here, if needed for problems. ?

## 2022-02-05 NOTE — ED Provider Notes (Signed)
?[facility] ?Provider Note ? ? ?CSN: [id number] ?Arrival date & time: 02/05/22  1213 ? ?  ? ?History ? ?Chief Complaint  ?Patient presents with  ? Loss of Consciousness  ? ? ?Amy [name] is a 88 y.o. female. ? ?HPI ?Patient is here for evaluation of an episode of syncope which occurred while she was sitting on the toilet after showering.  Her daughter was standing right next to her when this happened.  Patient lost consciousness while sitting up, and her head slumped forward.  She did not fall off the commode.  She remained unconscious until EMS arrived whereupon she awoke a little bit by the daughter's report.  She had an episode of syncope, 4 days ago when she fell off the commode.  She was evaluated here after that.  At that time she had rectal bleeding, felt to be from hemorrhoids.  Patient takes Eliquis.  Rectal bleeding has improved.  She has been having some stool irregularity, and saw a surgeon who recommended fiber.  She does not take a laxative or stool softener at this time.  There is been no recent fever, chills, vomiting or dizziness.  She denies chest pain.  She follows up regularly for pacemaker evaluations. ?  ? ?Home Medications ?Prior to Admission medications   ?Medication Sig Start Date End Date Taking? Authorizing Provider  ?acetaminophen (TYLENOL) 500 MG tablet Take 1,000 mg by mouth every 6 (six) hours as needed for moderate pain.    [provider]  ?albuterol (PROVENTIL HFA;VENTOLIN HFA) 108 (90 Base) MCG/ACT inhaler Inhale 2 puffs into the lungs every 6 (six) hours as needed for wheezing or shortness of breath.  ?Patient not taking: Reported on 12/20/2021    [provider]  ?allopurinol (ZYLOPRIM) 100 MG tablet Take 100 mg by mouth daily. ?Patient not taking: Reported on 12/20/2021 05/10/18   [provider]  ?calcium carbonate (TUMS - DOSED IN MG ELEMENTAL CALCIUM) 500 MG chewable tablet Chew 2 tablets by mouth daily as needed for indigestion or  heartburn. ?Patient not taking: Reported on 12/20/2021    [provider]  ?cetirizine (ZYRTEC) 10 MG tablet Take 10 mg by mouth daily. ?Patient not taking: Reported on 12/20/2021    [provider]  ?Cholecalciferol (VITAMIN D3 PO) Take 1 tablet by mouth daily.    [provider]  ?cycloSPORINE (RESTASIS) 0.05 % ophthalmic emulsion Place 1 drop into both eyes 2 (two) times daily.    [provider]  ?docusate sodium (COLACE) 100 MG capsule Take 100 mg by mouth 2 (two) times daily. ?Patient not taking: Reported on 12/20/2021    [provider]  ?ELIQUIS 2.5 MG TABS tablet Take 1 tablet by mouth twice daily 10/28/19   Croitoru, Mihai, MD  ?EUTHYROX 75 MCG tablet Take 75 mcg by mouth every morning. 02/07/21   [provider]  ?furosemide (LASIX) 40 MG tablet Take 1 tablet (40 mg total) by mouth daily. 12/20/21   Croitoru, Mihai, MD  ?gabapentin (NEURONTIN) 100 MG capsule Take 1 capsule (100 mg total) by mouth 2 (two) times daily. 05/19/20   Croitoru, Mihai, MD  ?ibuprofen (ADVIL) 200 MG tablet Take 200 mg by mouth. As needed ?Patient not taking: Reported on 12/20/2021    [provider]  ?LORazepam (ATIVAN) 0.5 MG tablet Take 0.5-1 mg by mouth at bedtime as needed. ?Patient not taking: Reported on 12/20/2021 12/24/20   [provider]  ?metoprolol succinate (TOPROL-XL) 25 MG 24 hr tablet Take  1 tablet by mouth once daily 08/21/20   Croitoru, Dani Gobble, MD  ?nitroGLYCERIN (NITROSTAT) 0.4 MG SL tablet Place 1 tablet (0.4 mg total) under the tongue every 5 (five) minutes x 3 doses as needed for chest pain. ?Patient not taking: Reported on 12/20/2021 04/17/14   Barrett, Evelene Croon, PA-C  ?omeprazole (PRILOSEC) 40 MG capsule Take 1 capsule by mouth every morning. 11/01/20   [provider]  ?potassium chloride (KLOR-CON) 10 MEQ tablet Take 10 mEq by mouth 2 (two) times daily. ?Patient not taking: Reported on 12/20/2021    [provider]  ?Probiotic CAPS  Take 1 capsule by mouth daily. ?Patient not taking: Reported on 12/20/2021    [provider]  ?REPATHA SURECLICK 272 MG/ML SOAJ Inject 140 mg as directed every 14 (fourteen) days. 07/15/21   Croitoru, Dani Gobble, MD  ?Donnal Debar 200-62.5-25 MCG/ACT AEPB daily. 10/28/21   [provider]  ?   ? ?Allergies    ?Colestipol, Hydrocodone, Niacin and related, Statins, and Zetia [ezetimibe]   ? ?Review of Systems   ?Review of Systems ? ?Physical Exam ?Updated Vital Signs ?BP (!) 138/57   Pulse (!) 59   Temp 97.9 ?F (36.6 ?C) (Oral)   Resp 12   Ht '5\' 5"'$  (1.651 m)   Wt 77.6 kg   SpO2 100%   BMI 28.46 kg/m?  ?Physical Exam ?Vitals and nursing note reviewed.  ?Constitutional:   ?   General: She is not in acute distress. ?   Appearance: She is well-developed. She is obese. She is not ill-appearing, toxic-appearing or diaphoretic.  ?HENT:  ?   Head: Normocephalic.  ?   Comments: Subacute appearing right periorbital ecchymosis and abrasion. ?   Right Ear: External ear normal.  ?   Left Ear: External ear normal.  ?Eyes:  ?   Conjunctiva/sclera: Conjunctivae normal.  ?   Pupils: Pupils are equal, round, and reactive to light.  ?Neck:  ?   Trachea: Phonation normal.  ?Cardiovascular:  ?   Rate and Rhythm: Normal rate and regular rhythm.  ?   Heart sounds: Normal heart sounds.  ?Pulmonary:  ?   Effort: Pulmonary effort is normal.  ?   Breath sounds: Normal breath sounds.  ?Abdominal:  ?   General: There is no distension.  ?   Palpations: Abdomen is soft.  ?   Tenderness: There is no abdominal tenderness. There is no guarding.  ?Musculoskeletal:     ?   General: No swelling, tenderness or signs of injury. Normal range of motion.  ?   Cervical back: Normal range of motion and neck supple.  ?Skin: ?   General: Skin is warm and dry.  ?Neurological:  ?   Mental Status: She is alert and oriented to person, place, and time.  ?   Cranial Nerves: No cranial nerve deficit.  ?   Sensory: No sensory deficit.  ?   Motor: No  abnormal muscle tone.  ?   Coordination: Coordination normal.  ?Psychiatric:     ?   Mood and Affect: Mood normal.     ?   Behavior: Behavior normal.     ?   Thought Content: Thought content normal.     ?   Judgment: Judgment normal.  ? ? ?ED Results / Procedures / Treatments   ?Labs ?(all labs ordered are listed, but only abnormal results are displayed) ?Labs Reviewed  ?BASIC METABOLIC PANEL - Abnormal; Notable for the following components:  ?  Result Value  ? Glucose, Bld 120 (*)   ? BUN 28 (*)   ? Creatinine, Ser 1.92 (*)   ? GFR, Estimated 23 (*)   ? All other components within normal limits  ?CBC WITH DIFFERENTIAL/PLATELET - Abnormal; Notable for the following components:  ? Hemoglobin 11.3 (*)   ? RDW 15.6 (*)   ? All other components within normal limits  ?URINALYSIS, ROUTINE W REFLEX MICROSCOPIC - Abnormal; Notable for the following components:  ? Color, Urine STRAW (*)   ? All other components within normal limits  ? ? ?EKG ?EKG Interpretation ? ?Date/Time:  Saturday February 05 2022 12:55:09 EDT ?Ventricular Rate:  64 ?PR Interval:  208 ?QRS Duration: 96 ?QT Interval:  435 ?QTC Calculation: 449 ?R Axis:   -33 ?Text Interpretation: Sinus rhythm Left axis deviation Anteroseptal infarct, old since last tracing no significant change Confirmed by Daleen Bo [phone]) on 02/05/2022 1:11:52 PM ? ?Radiology ?No results found. ? ?Procedures ?Procedures  ? ? ?Medications Ordered in ED ?Medications  ?acetaminophen (TYLENOL) tablet 650 mg (650 mg Oral Given 02/05/22 1423)  ? ? ?ED Course/ Medical Decision Making/ A&P ?  ?                        ?Medical Decision Making ?Patient presents for evaluation of syncope which occurred while she was sitting on a commode.  Apparently she was seated when EMS got to her and they found her blood pressure to be 59/40 which is low.  After that the patient did have a bowel movement.  She was then transferred here.  Blood sugar was normal at 150.  Patient has a history of persistent  atrial fibrillation, and has a pacemaker. ? ?Amount and/or Complexity of Data Reviewed ?Independent Historian: caregiver ?   Details: Daughter at bedside gives most of the history. ?External Data Reviewed: no

## 2022-02-07 DIAGNOSIS — I10 Essential (primary) hypertension: Secondary | ICD-10-CM | POA: Diagnosis not present

## 2022-02-07 DIAGNOSIS — F17210 Nicotine dependence, cigarettes, uncomplicated: Secondary | ICD-10-CM | POA: Diagnosis not present

## 2022-02-07 DIAGNOSIS — R55 Syncope and collapse: Secondary | ICD-10-CM | POA: Diagnosis not present

## 2022-02-10 ENCOUNTER — Ambulatory Visit (INDEPENDENT_AMBULATORY_CARE_PROVIDER_SITE_OTHER): Payer: Medicare Other

## 2022-02-10 DIAGNOSIS — I495 Sick sinus syndrome: Secondary | ICD-10-CM | POA: Diagnosis not present

## 2022-02-10 LAB — CUP PACEART REMOTE DEVICE CHECK
Battery Remaining Longevity: 118 mo
Battery Voltage: 3.01 V
Brady Statistic AP VP Percent: 0.11 %
Brady Statistic AP VS Percent: 52.06 %
Brady Statistic AS VP Percent: 0.05 %
Brady Statistic AS VS Percent: 47.78 %
Brady Statistic RA Percent Paced: 52.27 %
Brady Statistic RV Percent Paced: 0.16 %
Date Time Interrogation Session: 20230412212137
Implantable Lead Implant Date: 20100309
Implantable Lead Implant Date: 20100309
Implantable Lead Location: 753859
Implantable Lead Location: 753860
Implantable Lead Model: 5076
Implantable Lead Model: 5076
Implantable Pulse Generator Implant Date: 20190327
Lead Channel Impedance Value: 323 Ohm
Lead Channel Impedance Value: 380 Ohm
Lead Channel Impedance Value: 589 Ohm
Lead Channel Impedance Value: 646 Ohm
Lead Channel Pacing Threshold Amplitude: 0.5 V
Lead Channel Pacing Threshold Amplitude: 0.625 V
Lead Channel Pacing Threshold Pulse Width: 0.4 ms
Lead Channel Pacing Threshold Pulse Width: 0.4 ms
Lead Channel Sensing Intrinsic Amplitude: 12.625 mV
Lead Channel Sensing Intrinsic Amplitude: 12.625 mV
Lead Channel Sensing Intrinsic Amplitude: 3.125 mV
Lead Channel Sensing Intrinsic Amplitude: 3.125 mV
Lead Channel Setting Pacing Amplitude: 2 V
Lead Channel Setting Pacing Amplitude: 2.5 V
Lead Channel Setting Pacing Pulse Width: 0.4 ms
Lead Channel Setting Sensing Sensitivity: 1.2 mV

## 2022-02-14 DIAGNOSIS — R55 Syncope and collapse: Secondary | ICD-10-CM | POA: Diagnosis not present

## 2022-02-14 DIAGNOSIS — I10 Essential (primary) hypertension: Secondary | ICD-10-CM | POA: Diagnosis not present

## 2022-02-14 DIAGNOSIS — F17210 Nicotine dependence, cigarettes, uncomplicated: Secondary | ICD-10-CM | POA: Diagnosis not present

## 2022-02-14 DIAGNOSIS — Z6829 Body mass index (BMI) 29.0-29.9, adult: Secondary | ICD-10-CM | POA: Diagnosis not present

## 2022-02-14 DIAGNOSIS — K649 Unspecified hemorrhoids: Secondary | ICD-10-CM | POA: Diagnosis not present

## 2022-02-21 ENCOUNTER — Encounter: Payer: Medicare Other | Admitting: Cardiovascular Disease

## 2022-02-25 NOTE — Progress Notes (Signed)
Remote pacemaker transmission.   

## 2022-03-10 ENCOUNTER — Encounter: Payer: Self-pay | Admitting: Cardiovascular Disease

## 2022-03-10 ENCOUNTER — Ambulatory Visit (INDEPENDENT_AMBULATORY_CARE_PROVIDER_SITE_OTHER): Payer: Medicare Other | Admitting: Cardiovascular Disease

## 2022-03-10 VITALS — BP 146/82 | HR 69 | Ht 65.0 in | Wt 174.4 lb

## 2022-03-10 DIAGNOSIS — D6869 Other thrombophilia: Secondary | ICD-10-CM | POA: Diagnosis not present

## 2022-03-10 DIAGNOSIS — I495 Sick sinus syndrome: Secondary | ICD-10-CM | POA: Diagnosis not present

## 2022-03-10 DIAGNOSIS — I2510 Atherosclerotic heart disease of native coronary artery without angina pectoris: Secondary | ICD-10-CM

## 2022-03-10 DIAGNOSIS — E7801 Familial hypercholesterolemia: Secondary | ICD-10-CM | POA: Diagnosis not present

## 2022-03-10 DIAGNOSIS — I480 Paroxysmal atrial fibrillation: Secondary | ICD-10-CM

## 2022-03-10 DIAGNOSIS — I5032 Chronic diastolic (congestive) heart failure: Secondary | ICD-10-CM | POA: Diagnosis not present

## 2022-03-10 DIAGNOSIS — I10 Essential (primary) hypertension: Secondary | ICD-10-CM | POA: Diagnosis not present

## 2022-03-10 DIAGNOSIS — Z8739 Personal history of other diseases of the musculoskeletal system and connective tissue: Secondary | ICD-10-CM

## 2022-03-10 DIAGNOSIS — I350 Nonrheumatic aortic (valve) stenosis: Secondary | ICD-10-CM

## 2022-03-10 DIAGNOSIS — I6522 Occlusion and stenosis of left carotid artery: Secondary | ICD-10-CM

## 2022-03-10 DIAGNOSIS — Z950 Presence of cardiac pacemaker: Secondary | ICD-10-CM | POA: Diagnosis not present

## 2022-03-10 DIAGNOSIS — N184 Chronic kidney disease, stage 4 (severe): Secondary | ICD-10-CM | POA: Diagnosis not present

## 2022-03-10 NOTE — Progress Notes (Signed)
? ?Cardiology Office Note  ? ? ?Date:  03/13/2022  ? ?ID:  Amy [name], DOB 10-Dec-1921, MRN [id number] ? ?PCP:  [facility]  ?Cardiologist:  Sanda Klein, MD  ? ?Electrophysiologist:  None  ? ?Evaluation Performed:  Follow-Up Visit ? ?Chief Complaint:  dyspnea and edema ? ?History of Present Illness:   ? ?Amy [name] is a 88 y.o. female with history of coronary artery disease and previous bypass surgery, sinus node dysfunction and persistent atrial arrhythmia, history of left inferior hemianopsia due to retinal artery occlusion.  She underwent a pacemaker generator change out in 2019 (Medtronic azure XT).  She has stage III chronic kidney disease and moderate carotid stenosis.  She has severe mixed hyperlipidemia intolerant of statins, on Repatha. ? ?She has had a lot of problems with bowel movements.  Although she has several bowel movements a day, she has to strain very hard to eliminate.  On 1 occasion she strains so hard that she passed out.  Her daughter checked her blood pressure and it was 83/56, then slowly recovered.  She has also had hemorrhoidal bleeding.  She continues to take anticoagulation with Eliquis, dose adjusted for age and renal function. ? ?She is only taking furosemide every other day.  She has persistent 1+ lower extremity edema and NYHA functional class II-III shortness of breath with activity.  She complains of leg pain and foot pain but has not had what sounds like a true gout attack. ? ?Her echocardiogram showed preserved left ventricular systolic function and unchanged moderate aortic stenosis.  The mitral inflow pattern suggestive of impaired relaxation, E/e' was in indeterminate range (9-12).   ? ?She has not had orthopnea, PND or chest pain at rest or with activity. ? ?Pacemaker interrogation shows normal device function.  Estimated generator longevity is just under 10 years.  She has 70% atrial pacing and only 0.1% ventricular pacing.  She has had a very low burden  of atrial fibrillation, less than 0.1%, but just in the last month she has had a couple of lengthy episodes lasting for 4 hours and 6 hours, respectively. Both the atrial and ventricular leads are Medtronic 5076 leads.  She has a long AV conduction time at about 300 ms with atrial pacing. ? ?Most recent metabolic parameters from 51/70/0174 show HDL 54, LDL 89, total cholesterol 176, triglycerides 194.  The most recent hemoglobin A1c was 6.3%.  On 02/07/2022 the creatinine was 1.57 and the potassium was 3.9.  She is borderline anemic with a hemoglobin of 11.5. ? ?In 2009 she had an acute inferior wall ST segment elevation myocardial infarction treated with urgent angioplasty and placement of a bare-metal stent. Subsequently, she underwent multivessel bypass surgery with LIMA to LAD, SVG to ramus SVG to RCA. She has no evidence of residual myocardial injury and has preserved left ventricular systolic function. In June 2015, cardiac catheterization showed 90% proximal and mid LAD with patent distal LIMA bypass; 40% ostial circumflex, not bypassed; unchanged 80% proximal ramus intermedius, SVG to ramus occluded; patent stent right coronary artery, SVG to right coronary artery occluded. Her LVEF was 55%, there was no significant mitral regurgitation and the LVEDP was high normal at 15 mm Hg, (when she was short of breath). [name] stated that her dyspnea is likely multifactorial and cannot be explained completely by cardiac findings. In May 2016 she had a low risk nuclear study.  ?  ?In 2010 a permanent pacemaker (Medtronic) was implanted for symptomatic bradycardia.  She underwent a generator change out in March 2019.  In late 2013 she had a fall complicated by a hip fracture and radius fracture. She had an esophageal perforation complicated by mediastinitis and had a feeding tube for several months. In October 2015 she had another fall in her home (no syncope, tripped at [facility]carpet transition) and fractured her  pelvis and humerus.  ?  ?She has fairly severe hyperlipidemia with an LDL cholesterol in excess of 200. She is statin, niacin, ezetimibe and resin intolerant. She takes Repatha. ?  ?She has systolic HTN, but also a tendency to marked orthostatic hypotension (25 mm Hg drop in SBP). ?  ?She has moderate CKD, baseline GFR around 30 mL/minute, has seen Dr. Lowanda Foster in the past, but does not have regular nephrology follow-up. ?  ?July 2019 she had an episode of unilateral inferior hemianopsia.  Brief episodes of atrial fibrillation were seen around that time.  There was also a moderate left internal carotid artery stenosis.  She had persistent atrial fibrillation from late July until November 2019. ? ? ? ? ?Past Medical History:  ?Diagnosis Date  ? Anxiety   ? Arthritis   ? Asthma   ? CAD in native artery   ? s/p CABG x 3; Last Myoview in 07/2009 - non-ischemic; Echo 03/2012  Aortic Sclerosis with normal EF.  ? CKD (chronic kidney disease), stage III ([place])   ? DVT (deep vein thrombosis) in pregnancy   ? Esophageal perforation   ? 9/13  ? Fall 07/21/2014  ? FALL WITH INJURY  ? Fracture of head of humerus   ? Fracture of hip ([place]) 07/21/2014  ? LEFT  ? GERD (gastroesophageal reflux disease)   ? Hypercholesteremia   ? Hypertension   ? Hypothyroidism   ? Loss of vision 05/22/2018  ? LEFT EYE  ? Mediastinitis   ? s/p drainage  ? Myocardial infarction [facility])   ? Inferior STEMI 07/2008 - PCI of prox RCA followed byu CABG x 3 in 9/'09  ? Pacemaker   ? PAF (paroxysmal atrial fibrillation) ([place])   ? Paroxysmal atrial flutter ([place]) 11/26/2014  ? Pneumonia   ? Shortness of breath   ? SSS (sick sinus syndrome) ([place]) 11/26/2014  ? Status post hip hemiarthroplasty left hip by Dr Ronnie Derby 08/08/2012  ? ?Past Surgical History:  ?Procedure Laterality Date  ? CARDIOVERSION N/A 09/21/2018  ? Procedure: CARDIOVERSION;  Surgeon: Sanda Klein, MD;  Location: [place];  Service: Cardiovascular;  Laterality: N/A;  ? CARPAL TUNNEL RELEASE   08/08/2012  ? Procedure: CARPAL TUNNEL RELEASE;  Surgeon: Schuyler Amor, MD;  Location: [place];  Service: Orthopedics;  Laterality: Left;  ? CORONARY ARTERY BYPASS GRAFT  2009  ? LIMA-LAD, SVG-RI, SVG-stented RCA  ? ESOPHAGOGASTRODUODENOSCOPY  08/10/2012  ? Procedure: ESOPHAGOGASTRODUODENOSCOPY (EGD);  Surgeon: Beryle Beams, MD;  Location[facility] ENDOSCOPY;  Service: Endoscopy;  Laterality: N/A;  ? ESOPHAGOSCOPY  08/10/2012  ? Procedure: ESOPHAGOSCOPY;  Surgeon: [name], MD;  Location: [place];  Service: ENT;  Laterality: N/A;  ? GASTROSTOMY  08/16/2012  ? Procedure: GASTROSTOMY;  Surgeon: Zenovia Jarred, MD;  Location: [place];  Service: General;  Laterality: N/A;  ? HIP ARTHROPLASTY  08/08/2012  ? Procedure: ARTHROPLASTY BIPOLAR HIP;  Surgeon: Rudean Haskell, MD;  Location: [place];  Service: Orthopedics;  Laterality: Left;  [place] ?  ? JOINT REPLACEMENT    ? LEFT HEART CATHETERIZATION WITH CORONARY ANGIOGRAM N/A 04/16/2014  ? Procedure: LEFT HEART CATHETERIZATION WITH  CORONARY ANGIOGRAM;  Surgeon: Wellington Hampshire, MD;  Location: [facility] CATH LAB;  Service: Cardiovascular;  Laterality: N/A;  ? NM MYOCAR PERF WALL MOTION  08/14/2009  ? Normal  ? PACEMAKER INSERTION  01/06/2009  ? Medtronic  ? PPM GENERATOR CHANGEOUT N/A 01/24/2018  ? Procedure: PPM GENERATOR CHANGEOUT;  Surgeon: Sanda Klein, MD;  Location: [place] CV LAB;  Service: Cardiovascular;  Laterality: N/A;  ? TOTAL HIP ARTHROPLASTY    ? WRIST FRACTURE SURGERY  07/2012  ? left intra articular  with carpel tunnel  ?  ? ?Current Meds  ?Medication Sig  ? acetaminophen (TYLENOL) 500 MG tablet Take 1,000 mg by mouth every 6 (six) hours as needed for moderate pain.  ? allopurinol (ZYLOPRIM) 100 MG tablet Take 100 mg by mouth daily.  ? Cholecalciferol (VITAMIN D3 PO) Take 1 tablet by mouth daily.  ? cycloSPORINE (RESTASIS) 0.05 % ophthalmic emulsion Place 1 drop into both eyes 2 (two) times daily.  ? cycloSPORINE (RESTASIS) 0.05 % ophthalmic emulsion Place 1  drop into both eyes 2 (two) times daily.  ? ELIQUIS 2.5 MG TABS tablet Take 1 tablet by mouth twice daily  ? furosemide (LASIX) 40 MG tablet Take 1 tablet (40 mg total) by mouth daily.  ? gabapentin (NEURONTIN

## 2022-03-10 NOTE — Patient Instructions (Addendum)
Medication Instructions:  No changes *If you need a refill on your cardiac medications before your next appointment, please call your pharmacy*   Lab Work: None ordered If you have labs (blood work) drawn today and your tests are completely normal, you will receive your results only by: MyChart Message (if you have MyChart) OR A paper copy in the mail If you have any lab test that is abnormal or we need to change your treatment, we will call you to review the results.   Testing/Procedures: None ordered   Follow-Up: At [facility], you and your health needs are our priority.  As part of our continuing mission to provide you with exceptional heart care, we have created designated Provider Care Teams.  These Care Teams include your primary Cardiologist (physician) and Advanced Practice Providers (APPs -  Physician Assistants and Nurse Practitioners) who all work together to provide you with the care you need, when you need it.  We recommend signing up for the patient portal called "MyChart".  Sign up information is provided on this After Visit Summary.  MyChart is used to connect with patients for Virtual Visits (Telemedicine).  Patients are able to view lab/test results, encounter notes, upcoming appointments, etc.  Non-urgent messages can be sent to your provider as well.   To learn more about what you can do with MyChart, go to [email or web address].    Your next appointment:   3 month(s) on a device day  The format for your next appointment:   In Person  Provider:   Mihai Croitoru, MD {  Important Information About Sugar       

## 2022-03-21 DIAGNOSIS — K625 Hemorrhage of anus and rectum: Secondary | ICD-10-CM | POA: Diagnosis not present

## 2022-03-24 ENCOUNTER — Ambulatory Visit: Payer: Medicare Other | Admitting: Cardiovascular Disease

## 2022-03-30 DIAGNOSIS — K921 Melena: Secondary | ICD-10-CM | POA: Diagnosis not present

## 2022-03-30 DIAGNOSIS — R55 Syncope and collapse: Secondary | ICD-10-CM | POA: Diagnosis not present

## 2022-03-30 DIAGNOSIS — I10 Essential (primary) hypertension: Secondary | ICD-10-CM | POA: Diagnosis not present

## 2022-03-30 DIAGNOSIS — I4891 Unspecified atrial fibrillation: Secondary | ICD-10-CM | POA: Diagnosis not present

## 2022-03-30 DIAGNOSIS — F17210 Nicotine dependence, cigarettes, uncomplicated: Secondary | ICD-10-CM | POA: Diagnosis not present

## 2022-03-30 DIAGNOSIS — K649 Unspecified hemorrhoids: Secondary | ICD-10-CM | POA: Diagnosis not present

## 2022-04-04 ENCOUNTER — Encounter: Payer: Medicare Other | Admitting: Cardiovascular Disease

## 2022-05-12 ENCOUNTER — Ambulatory Visit (INDEPENDENT_AMBULATORY_CARE_PROVIDER_SITE_OTHER): Payer: Medicare Other

## 2022-05-12 DIAGNOSIS — I495 Sick sinus syndrome: Secondary | ICD-10-CM | POA: Diagnosis not present

## 2022-05-12 LAB — CUP PACEART REMOTE DEVICE CHECK
Battery Remaining Longevity: 114 mo
Battery Voltage: 3.01 V
Brady Statistic AP VP Percent: 0.12 %
Brady Statistic AP VS Percent: 87.33 %
Brady Statistic AS VP Percent: 0.03 %
Brady Statistic AS VS Percent: 12.52 %
Brady Statistic RA Percent Paced: 87.54 %
Brady Statistic RV Percent Paced: 0.23 %
Date Time Interrogation Session: 20230712214528
Implantable Lead Implant Date: 20100309
Implantable Lead Implant Date: 20100309
Implantable Lead Location: 753859
Implantable Lead Location: 753860
Implantable Lead Model: 5076
Implantable Lead Model: 5076
Implantable Pulse Generator Implant Date: 20190327
Lead Channel Impedance Value: 342 Ohm
Lead Channel Impedance Value: 380 Ohm
Lead Channel Impedance Value: 646 Ohm
Lead Channel Impedance Value: 684 Ohm
Lead Channel Pacing Threshold Amplitude: 0.5 V
Lead Channel Pacing Threshold Amplitude: 0.75 V
Lead Channel Pacing Threshold Pulse Width: 0.4 ms
Lead Channel Pacing Threshold Pulse Width: 0.4 ms
Lead Channel Sensing Intrinsic Amplitude: 13.25 mV
Lead Channel Sensing Intrinsic Amplitude: 13.25 mV
Lead Channel Sensing Intrinsic Amplitude: 3.25 mV
Lead Channel Sensing Intrinsic Amplitude: 3.25 mV
Lead Channel Setting Pacing Amplitude: 2 V
Lead Channel Setting Pacing Amplitude: 2.5 V
Lead Channel Setting Pacing Pulse Width: 0.4 ms
Lead Channel Setting Sensing Sensitivity: 1.2 mV

## 2022-05-18 DIAGNOSIS — H04123 Dry eye syndrome of bilateral lacrimal glands: Secondary | ICD-10-CM | POA: Diagnosis not present

## 2022-05-30 NOTE — Progress Notes (Signed)
Remote pacemaker transmission.   

## 2022-06-08 DIAGNOSIS — D485 Neoplasm of uncertain behavior of skin: Secondary | ICD-10-CM | POA: Diagnosis not present

## 2022-06-08 DIAGNOSIS — C44629 Squamous cell carcinoma of skin of left upper limb, including shoulder: Secondary | ICD-10-CM | POA: Diagnosis not present

## 2022-06-08 DIAGNOSIS — L570 Actinic keratosis: Secondary | ICD-10-CM | POA: Diagnosis not present

## 2022-06-14 DIAGNOSIS — I2510 Atherosclerotic heart disease of native coronary artery without angina pectoris: Secondary | ICD-10-CM | POA: Diagnosis not present

## 2022-06-14 DIAGNOSIS — S92524A Nondisplaced fracture of medial phalanx of right lesser toe(s), initial encounter for closed fracture: Secondary | ICD-10-CM | POA: Diagnosis not present

## 2022-06-14 DIAGNOSIS — R609 Edema, unspecified: Secondary | ICD-10-CM | POA: Diagnosis not present

## 2022-06-14 DIAGNOSIS — E785 Hyperlipidemia, unspecified: Secondary | ICD-10-CM | POA: Diagnosis not present

## 2022-06-14 DIAGNOSIS — Z043 Encounter for examination and observation following other accident: Secondary | ICD-10-CM | POA: Diagnosis not present

## 2022-06-14 DIAGNOSIS — R52 Pain, unspecified: Secondary | ICD-10-CM | POA: Diagnosis not present

## 2022-06-14 DIAGNOSIS — W19XXXA Unspecified fall, initial encounter: Secondary | ICD-10-CM | POA: Diagnosis not present

## 2022-06-14 DIAGNOSIS — M19071 Primary osteoarthritis, right ankle and foot: Secondary | ICD-10-CM | POA: Diagnosis not present

## 2022-06-14 DIAGNOSIS — W182XXA Fall in (into) shower or empty bathtub, initial encounter: Secondary | ICD-10-CM | POA: Diagnosis not present

## 2022-06-14 DIAGNOSIS — R55 Syncope and collapse: Secondary | ICD-10-CM | POA: Diagnosis not present

## 2022-06-14 DIAGNOSIS — S92911A Unspecified fracture of right toe(s), initial encounter for closed fracture: Secondary | ICD-10-CM | POA: Diagnosis not present

## 2022-06-14 DIAGNOSIS — M25562 Pain in left knee: Secondary | ICD-10-CM | POA: Diagnosis not present

## 2022-06-14 DIAGNOSIS — I959 Hypotension, unspecified: Secondary | ICD-10-CM | POA: Diagnosis not present

## 2022-06-14 DIAGNOSIS — M25571 Pain in right ankle and joints of right foot: Secondary | ICD-10-CM | POA: Diagnosis not present

## 2022-06-20 ENCOUNTER — Ambulatory Visit (INDEPENDENT_AMBULATORY_CARE_PROVIDER_SITE_OTHER): Payer: Medicare Other | Admitting: Cardiovascular Disease

## 2022-06-20 ENCOUNTER — Encounter: Payer: Self-pay | Admitting: Cardiovascular Disease

## 2022-06-20 VITALS — BP 140/72 | HR 63 | Ht 65.5 in | Wt 172.0 lb

## 2022-06-20 DIAGNOSIS — Z0189 Encounter for other specified special examinations: Secondary | ICD-10-CM

## 2022-06-20 DIAGNOSIS — I10 Essential (primary) hypertension: Secondary | ICD-10-CM | POA: Diagnosis not present

## 2022-06-20 DIAGNOSIS — I350 Nonrheumatic aortic (valve) stenosis: Secondary | ICD-10-CM

## 2022-06-20 DIAGNOSIS — Z950 Presence of cardiac pacemaker: Secondary | ICD-10-CM

## 2022-06-20 DIAGNOSIS — E7801 Familial hypercholesterolemia: Secondary | ICD-10-CM | POA: Diagnosis not present

## 2022-06-20 DIAGNOSIS — I495 Sick sinus syndrome: Secondary | ICD-10-CM

## 2022-06-20 DIAGNOSIS — R55 Syncope and collapse: Secondary | ICD-10-CM | POA: Diagnosis not present

## 2022-06-20 DIAGNOSIS — I6522 Occlusion and stenosis of left carotid artery: Secondary | ICD-10-CM

## 2022-06-20 DIAGNOSIS — N184 Chronic kidney disease, stage 4 (severe): Secondary | ICD-10-CM

## 2022-06-20 DIAGNOSIS — I480 Paroxysmal atrial fibrillation: Secondary | ICD-10-CM

## 2022-06-20 DIAGNOSIS — D6869 Other thrombophilia: Secondary | ICD-10-CM

## 2022-06-20 DIAGNOSIS — I2510 Atherosclerotic heart disease of native coronary artery without angina pectoris: Secondary | ICD-10-CM | POA: Diagnosis not present

## 2022-06-20 DIAGNOSIS — I5032 Chronic diastolic (congestive) heart failure: Secondary | ICD-10-CM

## 2022-06-20 NOTE — Patient Instructions (Signed)
Medication Instructions:  No changes *If you need a refill on your cardiac medications before your next appointment, please call your pharmacy*   Lab Work: None ordered If you have labs (blood work) drawn today and your tests are completely normal, you will receive your results only by: [place] (if you have MyChart) OR A paper copy in the mail If you have any lab test that is abnormal or we need to change your treatment, we will call you to review the results.   Testing/Procedures: None ordered   Follow-Up: At [facility], you and your health needs are our priority.  As part of our continuing mission to provide you with exceptional heart care, we have created designated Provider Care Teams.  These Care Teams include your primary Cardiologist (physician) and Advanced Practice Providers (APPs -  Physician Assistants and Nurse Practitioners) who all work together to provide you with the care you need, when you need it.  We recommend signing up for the patient portal called "MyChart".  Sign up information is provided on this After Visit Summary.  MyChart is used to connect with patients for Virtual Visits (Telemedicine).  Patients are able to view lab/test results, encounter notes, upcoming appointments, etc.  Non-urgent messages can be sent to your provider as well.   To learn more about what you can do with MyChart, go to [email or web address].    Your next appointment:   6 month(s)  The format for your next appointment:   In Person  Provider:   Sanda Klein, MD {    Other Instructions A referral has been made for evaluation for physical therapy

## 2022-06-20 NOTE — Progress Notes (Signed)
Cardiology Office Note    Date:  06/24/2022   ID:  Amy M Mercer, DOB 13-Sep-1922, MRN [id number]  PCP:  [facility]  Cardiologist:  Sanda Klein, MD   Electrophysiologist:  None   Evaluation Performed:  Follow-Up Visit  Chief Complaint:  syncope  History of Present Illness:    Amy [name] is a 88 y.o. female with history of coronary artery disease and previous bypass surgery, sinus node dysfunction and persistent atrial arrhythmia, history of left inferior hemianopsia due to retinal artery occlusion.  She underwent a pacemaker generator change out in 2019 (Medtronic Azure XT, original leads MDT 5076 from 2010).  She has stage III chronic kidney disease and moderate carotid stenosis.  She has severe mixed hyperlipidemia intolerant of statins, on Repatha.  She has had another syncopal event related to having a bowel movement, falling next to her commode.  She was evaluated in the emergency room at [facility], records not available.  This led to an injury to her right foot.  A similar event happened just before her last appointment, when she had documented hypotension.  She did not have any head injury with either 1 of these syncopal events.  She has not had any major bleeding issues.  She has had a lot of problems with bowel movements.  Although she has several bowel movements a day, she has to strain very hard to eliminate.    She has unchanged NYHA functional class II-3 exertional dyspnea and mild ankle swelling, taking furosemide only every other day.  She does not have orthopnea or PND.  She has not had any problems with chest pain.  She is not aware of any palpitations.  Pacemaker interrogation shows normal device function.  She has a very low burden of atrial fibrillation of 0.3% essentially made up entirely by 6-hour episode that occurred in early June.  She has 86.4% atrial pacing and only 0.3% ventricular pacing  Her echocardiogram in February 2023  showed preserved left ventricular systolic function and unchanged moderate aortic stenosis.  The mitral inflow pattern suggestive of impaired relaxation, E/e' was in indeterminate range (9-12).    Most recent metabolic parameters from 07/37/1062 show HDL 54, LDL 89, total cholesterol 176, triglycerides 194.  The most recent hemoglobin A1c was 6.3%.  On 02/07/2022 the creatinine was 1.57 and the potassium was 3.9.  She is borderline anemic with a hemoglobin of 11.5.  In 2009 she had an acute inferior wall ST segment elevation myocardial infarction treated with urgent angioplasty and placement of a bare-metal stent. Subsequently, she underwent multivessel bypass surgery with LIMA to LAD, SVG to ramus SVG to RCA. She has no evidence of residual myocardial injury and has preserved left ventricular systolic function. In June 2015, cardiac catheterization showed 90% proximal and mid LAD with patent distal LIMA bypass; 40% ostial circumflex, not bypassed; unchanged 80% proximal ramus intermedius, SVG to ramus occluded; patent stent right coronary artery, SVG to right coronary artery occluded. Her LVEF was 55%, there was no significant mitral regurgitation and the LVEDP was high normal at 15 mm Hg, (when she was short of breath). [name] stated that her dyspnea is likely multifactorial and cannot be explained completely by cardiac findings. In May 2016 she had a low risk nuclear study.    In 2010 a permanent pacemaker (Medtronic) was implanted for symptomatic bradycardia.  She underwent a generator change out in March 2019.  In late 2013 she had a fall complicated by  a hip fracture and radius fracture. She had an esophageal perforation complicated by mediastinitis and had a feeding tube for several months. In October 2015 she had another fall in her home (no syncope, [facility]) and fractured her pelvis and humerus.    She has fairly severe hyperlipidemia with an LDL cholesterol in  excess of 200. She is statin, niacin, ezetimibe and resin intolerant. She takes Repatha.   She has systolic HTN, but also a tendency to marked orthostatic hypotension (25 mm Hg drop in SBP).   She has moderate CKD, baseline GFR around 30 mL/minute, has seen Dr. Lowanda Foster in the past, but does not have regular nephrology follow-up.   July 2019 she had an episode of unilateral inferior hemianopsia.  Brief episodes of atrial fibrillation were seen around that time.  There was also a moderate left internal carotid artery stenosis.  She had persistent atrial fibrillation from late July until November 2019.     Past Medical History:  Diagnosis Date   Anxiety    Arthritis    Asthma    CAD in native artery    s/p CABG x 3; Last Myoview in 07/2009 - non-ischemic; Echo 03/2012  Aortic Sclerosis with normal EF.   CKD (chronic kidney disease), stage III (HCC)    DVT (deep vein thrombosis) in pregnancy    Esophageal perforation    9/13   Fall 07/21/2014   FALL WITH INJURY   Fracture of head of humerus    Fracture of hip ([place]) 07/21/2014   LEFT   GERD (gastroesophageal reflux disease)    Hypercholesteremia    Hypertension    Hypothyroidism    Loss of vision 05/22/2018   LEFT EYE   Mediastinitis    s/p drainage   Myocardial infarction [facility])    Inferior STEMI 07/2008 - PCI of prox RCA followed byu CABG x 3 in 9/'09   Pacemaker    PAF (paroxysmal atrial fibrillation) (HCC)    Paroxysmal atrial flutter ([place]) 11/26/2014   Pneumonia    Shortness of breath    SSS (sick sinus syndrome) ([place]) 11/26/2014   Status post hip hemiarthroplasty left hip by Dr Ronnie Derby 08/08/2012   Past Surgical History:  Procedure Laterality Date   CARDIOVERSION N/A 09/21/2018   Procedure: CARDIOVERSION;  Surgeon: Sanda Klein, MD;  Location: [place] ENDOSCOPY;  Service: Cardiovascular;  Laterality: N/A;   CARPAL TUNNEL RELEASE  08/08/2012   Procedure: CARPAL TUNNEL RELEASE;  Surgeon: Schuyler Amor, MD;  Location: [place];  Service: Orthopedics;  Laterality: Left;   CORONARY ARTERY BYPASS GRAFT  2009   LIMA-LAD, SVG-RI, SVG-stented RCA   ESOPHAGOGASTRODUODENOSCOPY  08/10/2012   Procedure: ESOPHAGOGASTRODUODENOSCOPY (EGD);  Surgeon: Beryle Beams, MD;  Location: [facility] ENDOSCOPY;  Service: Endoscopy;  Laterality: N/A;   ESOPHAGOSCOPY  08/10/2012   Procedure: ESOPHAGOSCOPY;  Surgeon: [name], MD;  Location: [place];  Service: ENT;  Laterality: N/A;   GASTROSTOMY  08/16/2012   Procedure: GASTROSTOMY;  Surgeon: Zenovia Jarred, MD;  Location: [place];  Service: General;  Laterality: N/A;   HIP ARTHROPLASTY  08/08/2012   Procedure: ARTHROPLASTY BIPOLAR HIP;  Surgeon: Rudean Haskell, MD;  Location: [place];  Service: Orthopedics;  Laterality: Left;  [place]    JOINT REPLACEMENT     LEFT HEART CATHETERIZATION WITH CORONARY ANGIOGRAM N/A 04/16/2014   Procedure: LEFT HEART CATHETERIZATION WITH CORONARY ANGIOGRAM;  Surgeon: Wellington Hampshire, MD;  Location: [place] CATH LAB;  Service: Cardiovascular;  Laterality: N/A;  NM MYOCAR PERF WALL MOTION  08/14/2009   Normal   PACEMAKER INSERTION  01/06/2009   Medtronic   PPM GENERATOR CHANGEOUT N/A 01/24/2018   Procedure: PPM GENERATOR CHANGEOUT;  Surgeon: Sanda Klein, MD;  Location: [place] CV LAB;  Service: Cardiovascular;  Laterality: N/A;   TOTAL HIP ARTHROPLASTY     WRIST FRACTURE SURGERY  07/2012   left intra articular  with carpel tunnel     Current Meds  Medication Sig   acetaminophen (TYLENOL) 500 MG tablet Take 1,000 mg by mouth every 6 (six) hours as needed for moderate pain.   albuterol (PROVENTIL HFA;VENTOLIN HFA) 108 (90 Base) MCG/ACT inhaler Inhale 2 puffs into the lungs every 6 (six) hours as needed for wheezing or shortness of breath.   allopurinol (ZYLOPRIM) 100 MG tablet Take 100 mg by mouth daily.   calcium carbonate (TUMS - DOSED IN MG ELEMENTAL CALCIUM) 500 MG chewable tablet Chew 2 tablets by mouth daily as needed for indigestion or heartburn.    cetirizine (ZYRTEC) 10 MG tablet Take 10 mg by mouth daily.   Cholecalciferol (VITAMIN D3 PO) Take 1 tablet by mouth daily.   cycloSPORINE (RESTASIS) 0.05 % ophthalmic emulsion Place 1 drop into both eyes 2 (two) times daily.   cycloSPORINE (RESTASIS) 0.05 % ophthalmic emulsion Place 1 drop into both eyes 2 (two) times daily.   docusate sodium (COLACE) 100 MG capsule Take 100 mg by mouth 2 (two) times daily.   ELIQUIS 2.5 MG TABS tablet Take 1 tablet by mouth twice daily   EUTHYROX 75 MCG tablet Take 75 mcg by mouth every morning.   furosemide (LASIX) 40 MG tablet Take 1 tablet (40 mg total) by mouth daily.   furosemide (LASIX) 40 MG tablet Take 40 mg by mouth every other day.   gabapentin (NEURONTIN) 100 MG capsule Take 1 capsule (100 mg total) by mouth 2 (two) times daily.   levothyroxine (SYNTHROID) 75 MCG tablet Take 1 tablet by mouth daily.   LORazepam (ATIVAN) 0.5 MG tablet Take 0.5-1 mg by mouth at bedtime as needed.   Magnesium 500 MG TABS Take 1 tablet by mouth daily in the afternoon.   metoprolol succinate (TOPROL-XL) 25 MG 24 hr tablet Take 1 tablet by mouth once daily   omeprazole (PRILOSEC) 20 MG capsule Take 20 mg by mouth 2 (two) times daily.   ondansetron (ZOFRAN) 4 MG tablet Take 4 mg by mouth daily as needed.   REPATHA SURECLICK 846 MG/ML SOAJ Inject 140 mg as directed every 14 (fourteen) days.   [EXPIRED] traMADol (ULTRAM) 50 MG tablet Take 50 mg by mouth daily as needed.   TRELEGY ELLIPTA 200-62.5-25 MCG/ACT AEPB daily.     Allergies:   Colestipol, Hydrocodone, Niacin and related, Statins, and Zetia [ezetimibe]   Social History   Tobacco Use   Smoking status: Never   Smokeless tobacco: Never  Vaping Use   Vaping Use: Never used  Substance Use Topics   Alcohol use: No   Drug use: No     Family Hx: The patient's family history includes Stroke in her father.  ROS:   Please see the history of present illness.    All other systems are reviewed and are negative.    Prior CV studies:   The following studies were reviewed today: Comprehensive remote pacemaker check today  Labs/Other Tests and Data Reviewed:    EKG: Not ordered today.  The most recent tracing from 02/05/2022 is personally reviewed and shows atrial paced, ventricular  sensed rhythm with long AV delay and QS pattern in leads V1-V2 without acute ischemic repolarization abnormalities.   Today the intracardiac electrogram shows atrial paced, ventricular sensed rhythm Recent Labs: 01/03/2022: BNP 140.8 01/20/2022: ALT 16 02/05/2022: BUN 28; Creatinine, Ser 1.92; Hemoglobin 11.3; Platelets 248; Potassium 3.9; Sodium 139   Recent Lipid Panel Lab Results  Component Value Date/Time   CHOL 193 08/13/2018 09:55 AM   TRIG 153 (H) 08/13/2018 09:55 AM   HDL 51 08/13/2018 09:55 AM   CHOLHDL 3.8 08/13/2018 09:55 AM   CHOLHDL 6.0 05/23/2018 08:56 AM   LDLCALC 111 (H) 08/13/2018 09:55 AM   12/02/2021  HDL 54, LDL 89, total cholesterol 176, triglycerides 194.   The most recent hemoglobin A1c was 6.3%.   Wt Readings from Last 3 Encounters:  06/20/22 172 lb (78 kg)  03/10/22 174 lb 6.4 oz (79.1 kg)  02/05/22 171 lb (77.6 kg)     Objective:    Vital Signs:  BP (!) 140/72 (BP Location: Left Arm, Patient Position: Sitting, Cuff Size: Large)   Pulse 63   Ht 5' 5.5" (1.664 m)   Wt 172 lb (78 kg)   SpO2 93%   BMI 28.19 kg/m     General: Alert, oriented x3, no distress, and stated age.  Healthy left subclavian pacemaker site. Head: no evidence of trauma, PERRL, EOMI, no exophtalmos or lid lag, no myxedema, no xanthelasma; normal ears, nose and oropharynx Neck: normal jugular venous pulsations and no hepatojugular reflux; brisk carotid pulses without delay and no carotid bruits Chest: clear to auscultation, no signs of consolidation by percussion or palpation, normal fremitus, symmetrical and full respiratory excursions Cardiovascular: normal position and quality of the apical impulse, regular  rhythm, normal first and second heart sounds, no murmurs, rubs or gallops Abdomen: no tenderness or distention, no masses by palpation, no abnormal pulsatility or arterial bruits, normal bowel sounds, no hepatosplenomegaly Extremities: no clubbing, cyanosis or edema; 2+ radial, ulnar and brachial pulses bilaterally; 2+ right femoral, posterior tibial and dorsalis pedis pulses; 2+ left femoral, posterior tibial and dorsalis pedis pulses; no subclavian or femoral bruits Neurological: grossly nonfocal Psych: Normal mood and affect     ASSESSMENT & PLAN:    Syncope: This has been consistently associated with bowel movements.  Suspect is due to straining and vagal reflexes.  There is no evidence of arrhythmia on her pacemaker to explain it.  Would prefer she use a bedside commode since she has twice fallen in her cramped bathroom and its been difficult to bring her out and lay her flat.  Avoid straining for prolonged periods of time.  Deconditioning is playing a big role in her falls.  We will get a physical therapy evaluation as well. CHF: No clinical signs of hypervolemia other than mild ankle swelling.  Continues to have NYHA functional class II-3 exertional dyspnea.  Dyspnea has been present around the time when she had cardiac catheterization with normal filling pressures in June 2015 and may have nothing to do with heart failure.  She did have a slight increase in her BNP and seem to get a little better with a higher dose of diuretic, but we need to balance diuretic therapy with her risk for hypotension/syncope and worsening renal function and gout attacks.  I would not increase her diuretic any further.  Avoiding SGLT2 inhibitors due to history of frequent urinary tract infections.  CAD s/p CABG: Denies angina pectoris.  No regional wall motion abnormalities on echo.  She may  have a significant coronary problem causing her dyspnea, but we are trying to be conservative with her management at age  82. AFib: Very low burden of arrhythmia, but with episodes the last for several hours.  On appropriate anticoagulation, CHADSVasc 8 (age 66, CVA 2, gender, HF, PAD/CAD, HTN).   Eliquis: Has had some hemorrhoidal bleeding.  No serious bleeding problems.  Most recent hemoglobin was only borderline low.  Dose of medication adjusted for age and renal function. SSS: Appropriate heart rate histogram distribution with the current sensor settings. PM: Has original leads from 2010 with excellent parameters and a recent generator change out.  Normal device function, continue remote downloads every 3 months. AS: Syncopal events have not occurred during physical exertion, but it is possible that the aortic stenosis is making her more prone to syncope.  The aortic stenosis is moderate by echo criteria (has a borderline low stroke-volume index at 31 mL/m sq).  Since her symptoms occur with minimal activity, I do not think the aortic stenosis is the cause.  She is almost 88 years old with numerous comorbid conditions and I do not think we will be offering TAVR even if she develops severe AS. HTN: Adequate control HLP: Although LDL is not quite at target it is the best we can probably achieve.  Severe cholesterol elevation with serious atherosclerotic disease, consistent with familial heterozygous hypercholesterolemia. LDL cholesterol remains elevated, but markedly improved compared to her baseline. Intolerant to statins/ezetimibe/resins but doing well on Repatha.   Left internal carotid artery stenosis: Medical management. No new neurological complaints. CKD 4: Seems to be at baseline.  Avoid increasing diuretics further. Hyperuricemia/history of gout: No overt changes of active inflammation/gout today.  I think allopurinol 100 mg daily is the appropriate dose for her.  Patient Instructions  Medication Instructions:  No changes *If you need a refill on your cardiac medications before your next appointment, please  call your pharmacy*   Lab Work: None ordered If you have labs (blood work) drawn today and your tests are completely normal, you will receive your results only by: [place] (if you have MyChart) OR A paper copy in the mail If you have any lab test that is abnormal or we need to change your treatment, we will call you to review the results.   Testing/Procedures: None ordered   Follow-Up: At [facility], you and your health needs are our priority.  As part of our continuing mission to provide you with exceptional heart care, we have created designated Provider Care Teams.  These Care Teams include your primary Cardiologist (physician) and Advanced Practice Providers (APPs -  Physician Assistants and Nurse Practitioners) who all work together to provide you with the care you need, when you need it.  We recommend signing up for the patient portal called "MyChart".  Sign up information is provided on this After Visit Summary.  MyChart is used to connect with patients for Virtual Visits (Telemedicine).  Patients are able to view lab/test results, encounter notes, upcoming appointments, etc.  Non-urgent messages can be sent to your provider as well.   To learn more about what you can do with MyChart, go to [email or web address].    Your next appointment:   6 month(s)  The format for your next appointment:   In Person  Provider:   Sanda Klein, MD {    Other Instructions A referral has been made for evaluation for physical therapy    Signed, Sanda Klein, MD  06/24/2022 4:33 PM  [facility]

## 2022-06-21 DIAGNOSIS — I4891 Unspecified atrial fibrillation: Secondary | ICD-10-CM | POA: Diagnosis not present

## 2022-06-21 DIAGNOSIS — I10 Essential (primary) hypertension: Secondary | ICD-10-CM | POA: Diagnosis not present

## 2022-06-21 DIAGNOSIS — N189 Chronic kidney disease, unspecified: Secondary | ICD-10-CM | POA: Diagnosis not present

## 2022-06-21 DIAGNOSIS — M25579 Pain in unspecified ankle and joints of unspecified foot: Secondary | ICD-10-CM | POA: Diagnosis not present

## 2022-06-21 DIAGNOSIS — Z7409 Other reduced mobility: Secondary | ICD-10-CM | POA: Diagnosis not present

## 2022-06-21 DIAGNOSIS — S92919A Unspecified fracture of unspecified toe(s), initial encounter for closed fracture: Secondary | ICD-10-CM | POA: Diagnosis not present

## 2022-06-21 DIAGNOSIS — M25569 Pain in unspecified knee: Secondary | ICD-10-CM | POA: Diagnosis not present

## 2022-06-21 DIAGNOSIS — F17210 Nicotine dependence, cigarettes, uncomplicated: Secondary | ICD-10-CM | POA: Diagnosis not present

## 2022-06-28 DIAGNOSIS — M25551 Pain in right hip: Secondary | ICD-10-CM | POA: Diagnosis not present

## 2022-06-28 DIAGNOSIS — M25552 Pain in left hip: Secondary | ICD-10-CM | POA: Diagnosis not present

## 2022-06-28 DIAGNOSIS — R52 Pain, unspecified: Secondary | ICD-10-CM | POA: Diagnosis not present

## 2022-06-28 DIAGNOSIS — M545 Low back pain, unspecified: Secondary | ICD-10-CM | POA: Diagnosis not present

## 2022-06-28 DIAGNOSIS — Z96643 Presence of artificial hip joint, bilateral: Secondary | ICD-10-CM | POA: Diagnosis not present

## 2022-06-28 DIAGNOSIS — M791 Myalgia, unspecified site: Secondary | ICD-10-CM | POA: Diagnosis not present

## 2022-06-28 DIAGNOSIS — M79671 Pain in right foot: Secondary | ICD-10-CM | POA: Diagnosis not present

## 2022-06-28 DIAGNOSIS — S92504A Nondisplaced unspecified fracture of right lesser toe(s), initial encounter for closed fracture: Secondary | ICD-10-CM | POA: Diagnosis not present

## 2022-06-28 DIAGNOSIS — S381XXD Crushing injury of abdomen, lower back, and pelvis, subsequent encounter: Secondary | ICD-10-CM | POA: Diagnosis not present

## 2022-06-30 DIAGNOSIS — I700 Atherosclerosis of aorta: Secondary | ICD-10-CM | POA: Diagnosis not present

## 2022-06-30 DIAGNOSIS — Z90710 Acquired absence of both cervix and uterus: Secondary | ICD-10-CM | POA: Diagnosis not present

## 2022-06-30 DIAGNOSIS — M545 Low back pain, unspecified: Secondary | ICD-10-CM | POA: Diagnosis not present

## 2022-06-30 DIAGNOSIS — Z96643 Presence of artificial hip joint, bilateral: Secondary | ICD-10-CM | POA: Diagnosis not present

## 2022-06-30 DIAGNOSIS — M549 Dorsalgia, unspecified: Secondary | ICD-10-CM | POA: Diagnosis not present

## 2022-06-30 DIAGNOSIS — M47816 Spondylosis without myelopathy or radiculopathy, lumbar region: Secondary | ICD-10-CM | POA: Diagnosis not present

## 2022-06-30 DIAGNOSIS — K5730 Diverticulosis of large intestine without perforation or abscess without bleeding: Secondary | ICD-10-CM | POA: Diagnosis not present

## 2022-06-30 DIAGNOSIS — M25551 Pain in right hip: Secondary | ICD-10-CM | POA: Diagnosis not present

## 2022-06-30 DIAGNOSIS — S381XXD Crushing injury of abdomen, lower back, and pelvis, subsequent encounter: Secondary | ICD-10-CM | POA: Diagnosis not present

## 2022-07-06 DIAGNOSIS — Z6828 Body mass index (BMI) 28.0-28.9, adult: Secondary | ICD-10-CM | POA: Diagnosis not present

## 2022-07-06 DIAGNOSIS — R030 Elevated blood-pressure reading, without diagnosis of hypertension: Secondary | ICD-10-CM | POA: Diagnosis not present

## 2022-07-06 DIAGNOSIS — F17210 Nicotine dependence, cigarettes, uncomplicated: Secondary | ICD-10-CM | POA: Diagnosis not present

## 2022-07-06 DIAGNOSIS — S92919A Unspecified fracture of unspecified toe(s), initial encounter for closed fracture: Secondary | ICD-10-CM | POA: Diagnosis not present

## 2022-07-06 DIAGNOSIS — M25569 Pain in unspecified knee: Secondary | ICD-10-CM | POA: Diagnosis not present

## 2022-07-06 DIAGNOSIS — M25579 Pain in unspecified ankle and joints of unspecified foot: Secondary | ICD-10-CM | POA: Diagnosis not present

## 2022-07-06 DIAGNOSIS — N189 Chronic kidney disease, unspecified: Secondary | ICD-10-CM | POA: Diagnosis not present

## 2022-07-06 DIAGNOSIS — Z7409 Other reduced mobility: Secondary | ICD-10-CM | POA: Diagnosis not present

## 2022-07-12 DIAGNOSIS — E039 Hypothyroidism, unspecified: Secondary | ICD-10-CM | POA: Diagnosis not present

## 2022-07-12 DIAGNOSIS — Z130 Encounter for screening for diseases of the blood and blood-forming organs and certain disorders involving the immune mechanism: Secondary | ICD-10-CM | POA: Diagnosis not present

## 2022-07-12 DIAGNOSIS — K219 Gastro-esophageal reflux disease without esophagitis: Secondary | ICD-10-CM | POA: Diagnosis not present

## 2022-07-12 DIAGNOSIS — N189 Chronic kidney disease, unspecified: Secondary | ICD-10-CM | POA: Diagnosis not present

## 2022-07-12 DIAGNOSIS — R739 Hyperglycemia, unspecified: Secondary | ICD-10-CM | POA: Diagnosis not present

## 2022-07-12 DIAGNOSIS — E782 Mixed hyperlipidemia: Secondary | ICD-10-CM | POA: Diagnosis not present

## 2022-07-14 DIAGNOSIS — C44629 Squamous cell carcinoma of skin of left upper limb, including shoulder: Secondary | ICD-10-CM | POA: Diagnosis not present

## 2022-07-15 DIAGNOSIS — S92504A Nondisplaced unspecified fracture of right lesser toe(s), initial encounter for closed fracture: Secondary | ICD-10-CM | POA: Diagnosis not present

## 2022-07-15 DIAGNOSIS — M545 Low back pain, unspecified: Secondary | ICD-10-CM | POA: Diagnosis not present

## 2022-07-15 DIAGNOSIS — Z96643 Presence of artificial hip joint, bilateral: Secondary | ICD-10-CM | POA: Diagnosis not present

## 2022-07-19 DIAGNOSIS — I4891 Unspecified atrial fibrillation: Secondary | ICD-10-CM | POA: Diagnosis not present

## 2022-07-19 DIAGNOSIS — Z7901 Long term (current) use of anticoagulants: Secondary | ICD-10-CM | POA: Diagnosis not present

## 2022-07-19 DIAGNOSIS — I2510 Atherosclerotic heart disease of native coronary artery without angina pectoris: Secondary | ICD-10-CM | POA: Diagnosis not present

## 2022-07-19 DIAGNOSIS — I509 Heart failure, unspecified: Secondary | ICD-10-CM | POA: Diagnosis not present

## 2022-07-19 DIAGNOSIS — E039 Hypothyroidism, unspecified: Secondary | ICD-10-CM | POA: Diagnosis not present

## 2022-07-19 DIAGNOSIS — I700 Atherosclerosis of aorta: Secondary | ICD-10-CM | POA: Diagnosis not present

## 2022-07-19 DIAGNOSIS — M109 Gout, unspecified: Secondary | ICD-10-CM | POA: Diagnosis not present

## 2022-07-19 DIAGNOSIS — S92531D Displaced fracture of distal phalanx of right lesser toe(s), subsequent encounter for fracture with routine healing: Secondary | ICD-10-CM | POA: Diagnosis not present

## 2022-07-19 DIAGNOSIS — Z9181 History of falling: Secondary | ICD-10-CM | POA: Diagnosis not present

## 2022-07-21 DIAGNOSIS — I2510 Atherosclerotic heart disease of native coronary artery without angina pectoris: Secondary | ICD-10-CM | POA: Diagnosis not present

## 2022-07-21 DIAGNOSIS — M109 Gout, unspecified: Secondary | ICD-10-CM | POA: Diagnosis not present

## 2022-07-21 DIAGNOSIS — S92531D Displaced fracture of distal phalanx of right lesser toe(s), subsequent encounter for fracture with routine healing: Secondary | ICD-10-CM | POA: Diagnosis not present

## 2022-07-21 DIAGNOSIS — E039 Hypothyroidism, unspecified: Secondary | ICD-10-CM | POA: Diagnosis not present

## 2022-07-21 DIAGNOSIS — I509 Heart failure, unspecified: Secondary | ICD-10-CM | POA: Diagnosis not present

## 2022-07-21 DIAGNOSIS — I4891 Unspecified atrial fibrillation: Secondary | ICD-10-CM | POA: Diagnosis not present

## 2022-07-22 DIAGNOSIS — M25579 Pain in unspecified ankle and joints of unspecified foot: Secondary | ICD-10-CM | POA: Diagnosis not present

## 2022-07-22 DIAGNOSIS — R030 Elevated blood-pressure reading, without diagnosis of hypertension: Secondary | ICD-10-CM | POA: Diagnosis not present

## 2022-07-22 DIAGNOSIS — Z6828 Body mass index (BMI) 28.0-28.9, adult: Secondary | ICD-10-CM | POA: Diagnosis not present

## 2022-07-22 DIAGNOSIS — S92919A Unspecified fracture of unspecified toe(s), initial encounter for closed fracture: Secondary | ICD-10-CM | POA: Diagnosis not present

## 2022-07-22 DIAGNOSIS — R7303 Prediabetes: Secondary | ICD-10-CM | POA: Diagnosis not present

## 2022-07-22 DIAGNOSIS — Z23 Encounter for immunization: Secondary | ICD-10-CM | POA: Diagnosis not present

## 2022-07-22 DIAGNOSIS — N189 Chronic kidney disease, unspecified: Secondary | ICD-10-CM | POA: Diagnosis not present

## 2022-07-22 DIAGNOSIS — Z7409 Other reduced mobility: Secondary | ICD-10-CM | POA: Diagnosis not present

## 2022-07-22 DIAGNOSIS — F17210 Nicotine dependence, cigarettes, uncomplicated: Secondary | ICD-10-CM | POA: Diagnosis not present

## 2022-07-22 DIAGNOSIS — M25569 Pain in unspecified knee: Secondary | ICD-10-CM | POA: Diagnosis not present

## 2022-07-25 DIAGNOSIS — I4891 Unspecified atrial fibrillation: Secondary | ICD-10-CM | POA: Diagnosis not present

## 2022-07-25 DIAGNOSIS — S92531D Displaced fracture of distal phalanx of right lesser toe(s), subsequent encounter for fracture with routine healing: Secondary | ICD-10-CM | POA: Diagnosis not present

## 2022-07-25 DIAGNOSIS — M109 Gout, unspecified: Secondary | ICD-10-CM | POA: Diagnosis not present

## 2022-07-25 DIAGNOSIS — E039 Hypothyroidism, unspecified: Secondary | ICD-10-CM | POA: Diagnosis not present

## 2022-07-25 DIAGNOSIS — I509 Heart failure, unspecified: Secondary | ICD-10-CM | POA: Diagnosis not present

## 2022-07-25 DIAGNOSIS — I2510 Atherosclerotic heart disease of native coronary artery without angina pectoris: Secondary | ICD-10-CM | POA: Diagnosis not present

## 2022-07-27 DIAGNOSIS — S92531D Displaced fracture of distal phalanx of right lesser toe(s), subsequent encounter for fracture with routine healing: Secondary | ICD-10-CM | POA: Diagnosis not present

## 2022-07-27 DIAGNOSIS — I2510 Atherosclerotic heart disease of native coronary artery without angina pectoris: Secondary | ICD-10-CM | POA: Diagnosis not present

## 2022-07-27 DIAGNOSIS — E039 Hypothyroidism, unspecified: Secondary | ICD-10-CM | POA: Diagnosis not present

## 2022-07-27 DIAGNOSIS — M109 Gout, unspecified: Secondary | ICD-10-CM | POA: Diagnosis not present

## 2022-07-27 DIAGNOSIS — I509 Heart failure, unspecified: Secondary | ICD-10-CM | POA: Diagnosis not present

## 2022-07-27 DIAGNOSIS — I4891 Unspecified atrial fibrillation: Secondary | ICD-10-CM | POA: Diagnosis not present

## 2022-07-28 DIAGNOSIS — F17210 Nicotine dependence, cigarettes, uncomplicated: Secondary | ICD-10-CM | POA: Diagnosis not present

## 2022-07-28 DIAGNOSIS — M5126 Other intervertebral disc displacement, lumbar region: Secondary | ICD-10-CM | POA: Diagnosis not present

## 2022-07-28 DIAGNOSIS — M5136 Other intervertebral disc degeneration, lumbar region: Secondary | ICD-10-CM | POA: Diagnosis not present

## 2022-07-28 DIAGNOSIS — M549 Dorsalgia, unspecified: Secondary | ICD-10-CM | POA: Diagnosis not present

## 2022-08-02 DIAGNOSIS — I509 Heart failure, unspecified: Secondary | ICD-10-CM | POA: Diagnosis not present

## 2022-08-02 DIAGNOSIS — M109 Gout, unspecified: Secondary | ICD-10-CM | POA: Diagnosis not present

## 2022-08-02 DIAGNOSIS — S92531D Displaced fracture of distal phalanx of right lesser toe(s), subsequent encounter for fracture with routine healing: Secondary | ICD-10-CM | POA: Diagnosis not present

## 2022-08-02 DIAGNOSIS — E039 Hypothyroidism, unspecified: Secondary | ICD-10-CM | POA: Diagnosis not present

## 2022-08-02 DIAGNOSIS — I4891 Unspecified atrial fibrillation: Secondary | ICD-10-CM | POA: Diagnosis not present

## 2022-08-02 DIAGNOSIS — I2510 Atherosclerotic heart disease of native coronary artery without angina pectoris: Secondary | ICD-10-CM | POA: Diagnosis not present

## 2022-08-04 DIAGNOSIS — I4891 Unspecified atrial fibrillation: Secondary | ICD-10-CM | POA: Diagnosis not present

## 2022-08-04 DIAGNOSIS — M109 Gout, unspecified: Secondary | ICD-10-CM | POA: Diagnosis not present

## 2022-08-04 DIAGNOSIS — S92531D Displaced fracture of distal phalanx of right lesser toe(s), subsequent encounter for fracture with routine healing: Secondary | ICD-10-CM | POA: Diagnosis not present

## 2022-08-04 DIAGNOSIS — I509 Heart failure, unspecified: Secondary | ICD-10-CM | POA: Diagnosis not present

## 2022-08-04 DIAGNOSIS — E039 Hypothyroidism, unspecified: Secondary | ICD-10-CM | POA: Diagnosis not present

## 2022-08-04 DIAGNOSIS — I2510 Atherosclerotic heart disease of native coronary artery without angina pectoris: Secondary | ICD-10-CM | POA: Diagnosis not present

## 2022-08-09 DIAGNOSIS — E039 Hypothyroidism, unspecified: Secondary | ICD-10-CM | POA: Diagnosis not present

## 2022-08-09 DIAGNOSIS — I4891 Unspecified atrial fibrillation: Secondary | ICD-10-CM | POA: Diagnosis not present

## 2022-08-09 DIAGNOSIS — I2510 Atherosclerotic heart disease of native coronary artery without angina pectoris: Secondary | ICD-10-CM | POA: Diagnosis not present

## 2022-08-09 DIAGNOSIS — M109 Gout, unspecified: Secondary | ICD-10-CM | POA: Diagnosis not present

## 2022-08-09 DIAGNOSIS — S92531D Displaced fracture of distal phalanx of right lesser toe(s), subsequent encounter for fracture with routine healing: Secondary | ICD-10-CM | POA: Diagnosis not present

## 2022-08-09 DIAGNOSIS — R300 Dysuria: Secondary | ICD-10-CM | POA: Diagnosis not present

## 2022-08-09 DIAGNOSIS — I509 Heart failure, unspecified: Secondary | ICD-10-CM | POA: Diagnosis not present

## 2022-08-11 ENCOUNTER — Ambulatory Visit (INDEPENDENT_AMBULATORY_CARE_PROVIDER_SITE_OTHER): Payer: Medicare Other

## 2022-08-11 DIAGNOSIS — I495 Sick sinus syndrome: Secondary | ICD-10-CM

## 2022-08-11 DIAGNOSIS — M109 Gout, unspecified: Secondary | ICD-10-CM | POA: Diagnosis not present

## 2022-08-11 DIAGNOSIS — I2510 Atherosclerotic heart disease of native coronary artery without angina pectoris: Secondary | ICD-10-CM | POA: Diagnosis not present

## 2022-08-11 DIAGNOSIS — S92531D Displaced fracture of distal phalanx of right lesser toe(s), subsequent encounter for fracture with routine healing: Secondary | ICD-10-CM | POA: Diagnosis not present

## 2022-08-11 DIAGNOSIS — I509 Heart failure, unspecified: Secondary | ICD-10-CM | POA: Diagnosis not present

## 2022-08-11 DIAGNOSIS — I4891 Unspecified atrial fibrillation: Secondary | ICD-10-CM | POA: Diagnosis not present

## 2022-08-11 DIAGNOSIS — E039 Hypothyroidism, unspecified: Secondary | ICD-10-CM | POA: Diagnosis not present

## 2022-08-11 LAB — CUP PACEART REMOTE DEVICE CHECK
Battery Remaining Longevity: 110 mo
Battery Voltage: 3.01 V
Brady Statistic AP VP Percent: 0.15 %
Brady Statistic AP VS Percent: 77.82 %
Brady Statistic AS VP Percent: 0.04 %
Brady Statistic AS VS Percent: 21.99 %
Brady Statistic RA Percent Paced: 77.39 %
Brady Statistic RV Percent Paced: 0.92 %
Date Time Interrogation Session: 20231012050511
Implantable Lead Implant Date: 20100309
Implantable Lead Implant Date: 20100309
Implantable Lead Location: 753859
Implantable Lead Location: 753860
Implantable Lead Model: 5076
Implantable Lead Model: 5076
Implantable Pulse Generator Implant Date: 20190327
Lead Channel Impedance Value: 304 Ohm
Lead Channel Impedance Value: 361 Ohm
Lead Channel Impedance Value: 513 Ohm
Lead Channel Impedance Value: 551 Ohm
Lead Channel Pacing Threshold Amplitude: 0.625 V
Lead Channel Pacing Threshold Amplitude: 1 V
Lead Channel Pacing Threshold Pulse Width: 0.4 ms
Lead Channel Pacing Threshold Pulse Width: 0.4 ms
Lead Channel Sensing Intrinsic Amplitude: 12.875 mV
Lead Channel Sensing Intrinsic Amplitude: 12.875 mV
Lead Channel Sensing Intrinsic Amplitude: 2.625 mV
Lead Channel Sensing Intrinsic Amplitude: 2.625 mV
Lead Channel Setting Pacing Amplitude: 2 V
Lead Channel Setting Pacing Amplitude: 2.5 V
Lead Channel Setting Pacing Pulse Width: 0.4 ms
Lead Channel Setting Sensing Sensitivity: 1.2 mV

## 2022-08-17 DIAGNOSIS — M109 Gout, unspecified: Secondary | ICD-10-CM | POA: Diagnosis not present

## 2022-08-17 DIAGNOSIS — I2510 Atherosclerotic heart disease of native coronary artery without angina pectoris: Secondary | ICD-10-CM | POA: Diagnosis not present

## 2022-08-17 DIAGNOSIS — E039 Hypothyroidism, unspecified: Secondary | ICD-10-CM | POA: Diagnosis not present

## 2022-08-17 DIAGNOSIS — I509 Heart failure, unspecified: Secondary | ICD-10-CM | POA: Diagnosis not present

## 2022-08-17 DIAGNOSIS — S92531D Displaced fracture of distal phalanx of right lesser toe(s), subsequent encounter for fracture with routine healing: Secondary | ICD-10-CM | POA: Diagnosis not present

## 2022-08-17 DIAGNOSIS — I4891 Unspecified atrial fibrillation: Secondary | ICD-10-CM | POA: Diagnosis not present

## 2022-08-18 DIAGNOSIS — Z7901 Long term (current) use of anticoagulants: Secondary | ICD-10-CM | POA: Diagnosis not present

## 2022-08-18 DIAGNOSIS — S92531D Displaced fracture of distal phalanx of right lesser toe(s), subsequent encounter for fracture with routine healing: Secondary | ICD-10-CM | POA: Diagnosis not present

## 2022-08-18 DIAGNOSIS — M109 Gout, unspecified: Secondary | ICD-10-CM | POA: Diagnosis not present

## 2022-08-18 DIAGNOSIS — I2510 Atherosclerotic heart disease of native coronary artery without angina pectoris: Secondary | ICD-10-CM | POA: Diagnosis not present

## 2022-08-18 DIAGNOSIS — I4891 Unspecified atrial fibrillation: Secondary | ICD-10-CM | POA: Diagnosis not present

## 2022-08-18 DIAGNOSIS — I509 Heart failure, unspecified: Secondary | ICD-10-CM | POA: Diagnosis not present

## 2022-08-18 DIAGNOSIS — Z9181 History of falling: Secondary | ICD-10-CM | POA: Diagnosis not present

## 2022-08-18 DIAGNOSIS — E039 Hypothyroidism, unspecified: Secondary | ICD-10-CM | POA: Diagnosis not present

## 2022-08-18 DIAGNOSIS — I700 Atherosclerosis of aorta: Secondary | ICD-10-CM | POA: Diagnosis not present

## 2022-08-22 DIAGNOSIS — E875 Hyperkalemia: Secondary | ICD-10-CM | POA: Diagnosis not present

## 2022-08-22 DIAGNOSIS — N189 Chronic kidney disease, unspecified: Secondary | ICD-10-CM | POA: Diagnosis not present

## 2022-08-22 NOTE — Progress Notes (Signed)
Remote pacemaker transmission.   

## 2022-08-23 DIAGNOSIS — M109 Gout, unspecified: Secondary | ICD-10-CM | POA: Diagnosis not present

## 2022-08-23 DIAGNOSIS — E039 Hypothyroidism, unspecified: Secondary | ICD-10-CM | POA: Diagnosis not present

## 2022-08-23 DIAGNOSIS — I4891 Unspecified atrial fibrillation: Secondary | ICD-10-CM | POA: Diagnosis not present

## 2022-08-23 DIAGNOSIS — I2510 Atherosclerotic heart disease of native coronary artery without angina pectoris: Secondary | ICD-10-CM | POA: Diagnosis not present

## 2022-08-23 DIAGNOSIS — I509 Heart failure, unspecified: Secondary | ICD-10-CM | POA: Diagnosis not present

## 2022-08-23 DIAGNOSIS — S92531D Displaced fracture of distal phalanx of right lesser toe(s), subsequent encounter for fracture with routine healing: Secondary | ICD-10-CM | POA: Diagnosis not present

## 2022-08-31 DIAGNOSIS — I4891 Unspecified atrial fibrillation: Secondary | ICD-10-CM | POA: Diagnosis not present

## 2022-08-31 DIAGNOSIS — E039 Hypothyroidism, unspecified: Secondary | ICD-10-CM | POA: Diagnosis not present

## 2022-08-31 DIAGNOSIS — I509 Heart failure, unspecified: Secondary | ICD-10-CM | POA: Diagnosis not present

## 2022-08-31 DIAGNOSIS — I2510 Atherosclerotic heart disease of native coronary artery without angina pectoris: Secondary | ICD-10-CM | POA: Diagnosis not present

## 2022-08-31 DIAGNOSIS — M109 Gout, unspecified: Secondary | ICD-10-CM | POA: Diagnosis not present

## 2022-08-31 DIAGNOSIS — S92531D Displaced fracture of distal phalanx of right lesser toe(s), subsequent encounter for fracture with routine healing: Secondary | ICD-10-CM | POA: Diagnosis not present

## 2022-09-01 DIAGNOSIS — M79676 Pain in unspecified toe(s): Secondary | ICD-10-CM | POA: Diagnosis not present

## 2022-09-01 DIAGNOSIS — Z6827 Body mass index (BMI) 27.0-27.9, adult: Secondary | ICD-10-CM | POA: Diagnosis not present

## 2022-09-01 DIAGNOSIS — F17210 Nicotine dependence, cigarettes, uncomplicated: Secondary | ICD-10-CM | POA: Diagnosis not present

## 2022-09-01 DIAGNOSIS — R030 Elevated blood-pressure reading, without diagnosis of hypertension: Secondary | ICD-10-CM | POA: Diagnosis not present

## 2022-09-01 DIAGNOSIS — J069 Acute upper respiratory infection, unspecified: Secondary | ICD-10-CM | POA: Diagnosis not present

## 2022-09-01 DIAGNOSIS — Z20828 Contact with and (suspected) exposure to other viral communicable diseases: Secondary | ICD-10-CM | POA: Diagnosis not present

## 2022-09-01 DIAGNOSIS — K5900 Constipation, unspecified: Secondary | ICD-10-CM | POA: Diagnosis not present

## 2022-09-08 DIAGNOSIS — F17210 Nicotine dependence, cigarettes, uncomplicated: Secondary | ICD-10-CM | POA: Diagnosis not present

## 2022-09-08 DIAGNOSIS — R059 Cough, unspecified: Secondary | ICD-10-CM | POA: Diagnosis not present

## 2022-09-08 DIAGNOSIS — J209 Acute bronchitis, unspecified: Secondary | ICD-10-CM | POA: Diagnosis not present

## 2022-09-08 DIAGNOSIS — R030 Elevated blood-pressure reading, without diagnosis of hypertension: Secondary | ICD-10-CM | POA: Diagnosis not present

## 2022-09-08 DIAGNOSIS — M25569 Pain in unspecified knee: Secondary | ICD-10-CM | POA: Diagnosis not present

## 2022-09-16 ENCOUNTER — Encounter (HOSPITAL_COMMUNITY): Payer: Self-pay | Admitting: Emergency Medicine

## 2022-09-16 ENCOUNTER — Emergency Department (HOSPITAL_COMMUNITY)
Admission: EM | Admit: 2022-09-16 | Discharge: 2022-09-16 | Disposition: A | Discharge status: Home / Self Care | Payer: Medicare Other | Attending: Emergency Medicine | Admitting: Emergency Medicine

## 2022-09-16 ENCOUNTER — Other Ambulatory Visit: Payer: Self-pay

## 2022-09-16 ENCOUNTER — Emergency Department (HOSPITAL_COMMUNITY): Payer: Medicare Other

## 2022-09-16 DIAGNOSIS — S8391XA Sprain of unspecified site of right knee, initial encounter: Secondary | ICD-10-CM | POA: Diagnosis not present

## 2022-09-16 DIAGNOSIS — Z96641 Presence of right artificial hip joint: Secondary | ICD-10-CM | POA: Diagnosis not present

## 2022-09-16 DIAGNOSIS — I959 Hypotension, unspecified: Secondary | ICD-10-CM | POA: Diagnosis not present

## 2022-09-16 DIAGNOSIS — W19XXXA Unspecified fall, initial encounter: Secondary | ICD-10-CM | POA: Insufficient documentation

## 2022-09-16 DIAGNOSIS — Y92009 Unspecified place in unspecified non-institutional (private) residence as the place of occurrence of the external cause: Secondary | ICD-10-CM | POA: Insufficient documentation

## 2022-09-16 DIAGNOSIS — M25571 Pain in right ankle and joints of right foot: Secondary | ICD-10-CM | POA: Diagnosis not present

## 2022-09-16 DIAGNOSIS — Z79899 Other long term (current) drug therapy: Secondary | ICD-10-CM | POA: Insufficient documentation

## 2022-09-16 DIAGNOSIS — M25551 Pain in right hip: Secondary | ICD-10-CM | POA: Diagnosis not present

## 2022-09-16 DIAGNOSIS — R0689 Other abnormalities of breathing: Secondary | ICD-10-CM | POA: Diagnosis not present

## 2022-09-16 DIAGNOSIS — S79911A Unspecified injury of right hip, initial encounter: Secondary | ICD-10-CM | POA: Diagnosis not present

## 2022-09-16 DIAGNOSIS — R52 Pain, unspecified: Secondary | ICD-10-CM | POA: Diagnosis not present

## 2022-09-16 DIAGNOSIS — S8991XA Unspecified injury of right lower leg, initial encounter: Secondary | ICD-10-CM | POA: Diagnosis not present

## 2022-09-16 DIAGNOSIS — I10 Essential (primary) hypertension: Secondary | ICD-10-CM | POA: Diagnosis not present

## 2022-09-16 LAB — BASIC METABOLIC PANEL
Anion gap: 8 (ref 5–15)
BUN: 24 mg/dL — ABNORMAL HIGH (ref 8–23)
CO2: 29 mmol/L (ref 22–32)
Calcium: 9.3 mg/dL (ref 8.9–10.3)
Chloride: 99 mmol/L (ref 98–111)
Creatinine, Ser: 1.35 mg/dL — ABNORMAL HIGH (ref 0.44–1.00)
GFR, Estimated: 36 mL/min — ABNORMAL LOW (ref >60)
Glucose, Bld: 86 mg/dL (ref 70–99)
Potassium: 4 mmol/L (ref 3.5–5.1)
Sodium: 136 mmol/L (ref 135–145)

## 2022-09-16 LAB — CBC WITH DIFFERENTIAL/PLATELET
Abs Immature Granulocytes: 0.05 10*3/uL (ref 0.00–0.07)
Basophils Absolute: 0 10*3/uL (ref 0.0–0.1)
Basophils Relative: 0 %
Eosinophils Absolute: 0.1 10*3/uL (ref 0.0–0.5)
Eosinophils Relative: 1 %
HCT: 37.6 % (ref 36.0–46.0)
Hemoglobin: 11.6 g/dL — ABNORMAL LOW (ref 12.0–15.0)
Immature Granulocytes: 1 %
Lymphocytes Relative: 23 %
Lymphs Abs: 2.4 10*3/uL (ref 0.7–4.0)
MCH: 25.7 pg — ABNORMAL LOW (ref 26.0–34.0)
MCHC: 30.9 g/dL (ref 30.0–36.0)
MCV: 83.2 fL (ref 80.0–100.0)
Monocytes Absolute: 0.7 10*3/uL (ref 0.1–1.0)
Monocytes Relative: 7 %
Neutro Abs: 7.1 10*3/uL (ref 1.7–7.7)
Neutrophils Relative %: 68 %
Platelets: 227 10*3/uL (ref 150–400)
RBC: 4.52 MIL/uL (ref 3.87–5.11)
RDW: 17.4 % — ABNORMAL HIGH (ref 11.5–15.5)
WBC: 10.3 10*3/uL (ref 4.0–10.5)
nRBC: 0 % (ref 0.0–0.2)

## 2022-09-16 NOTE — ED Provider Notes (Signed)
[facility] EMERGENCY DEPARTMENT Provider Note   CSN: [id number] Arrival date & time: 09/16/22  1251     History  Chief Complaint  Patient presents with   Lytle Michaels    Amy [name] is a 88 y.o. female.  The history is provided by the patient, a relative and medical records. No language interpreter was used.  Fall     Patient is a 88 year old female presenting to ED after a fall this morning. Patient states she was sitting on the toilet and when she stood up, her right knee buckled. Patient does have a caretaker at home who prevented her from falling to the floor, but she did fall into her walker and had her head stuck between the bars. Patient reports pain in the right knee that radiates down to the lateral right ankle and right hip. Patient denies fever, headache, SOB, palpitations, confusion, memory changes, numbness, tingling.    Home Medications Prior to Admission medications   Medication Sig Start Date End Date Taking? Authorizing Provider  acetaminophen (TYLENOL) 500 MG tablet Take 1,000 mg by mouth every 6 (six) hours as needed for moderate pain.    [provider]  albuterol (PROVENTIL HFA;VENTOLIN HFA) 108 (90 Base) MCG/ACT inhaler Inhale 2 puffs into the lungs every 6 (six) hours as needed for wheezing or shortness of breath.    [provider]  allopurinol (ZYLOPRIM) 100 MG tablet Take 100 mg by mouth daily. 05/10/18   [provider]  calcium carbonate (TUMS - DOSED IN MG ELEMENTAL CALCIUM) 500 MG chewable tablet Chew 2 tablets by mouth daily as needed for indigestion or heartburn.    [provider]  cetirizine (ZYRTEC) 10 MG tablet Take 10 mg by mouth daily.    [provider]  Cholecalciferol (VITAMIN D3 PO) Take 1 tablet by mouth daily.    [provider]  cycloSPORINE (RESTASIS) 0.05 % ophthalmic emulsion Place 1 drop into both eyes 2 (two) times daily.    [provider]  cycloSPORINE (RESTASIS) 0.05 %  ophthalmic emulsion Place 1 drop into both eyes 2 (two) times daily.    [provider]  docusate sodium (COLACE) 100 MG capsule Take 100 mg by mouth 2 (two) times daily.    [provider]  ELIQUIS 2.5 MG TABS tablet Take 1 tablet by mouth twice daily 10/28/19   Croitoru, Mihai, MD  EUTHYROX 75 MCG tablet Take 75 mcg by mouth every morning. 02/07/21   [provider]  furosemide (LASIX) 40 MG tablet Take 1 tablet (40 mg total) by mouth daily. 12/20/21   Croitoru, Mihai, MD  furosemide (LASIX) 40 MG tablet Take 40 mg by mouth every other day. 10/18/21   [provider]  gabapentin (NEURONTIN) 100 MG capsule Take 1 capsule (100 mg total) by mouth 2 (two) times daily. 05/19/20   Croitoru, Mihai, MD  levothyroxine (SYNTHROID) 75 MCG tablet Take 1 tablet by mouth daily. 11/24/21   [provider]  LORazepam (ATIVAN) 0.5 MG tablet Take 0.5-1 mg by mouth at bedtime as needed. 12/24/20   [provider]  Magnesium 500 MG TABS Take 1 tablet by mouth daily in the afternoon.    [provider]  metoprolol succinate (TOPROL-XL) 25 MG 24 hr tablet Take 1 tablet by mouth once daily 08/21/20   Croitoru, Mihai, MD  nitroGLYCERIN (NITROSTAT) 0.4 MG SL tablet Place 1 tablet (0.4 mg total) under the tongue every 5 (five) minutes x 3 doses as needed  for chest pain. Patient not taking: Reported on 06/20/2022 04/17/14   Barrett, Evelene Croon, PA-C  omeprazole (PRILOSEC) 20 MG capsule Take 20 mg by mouth 2 (two) times daily. 10/18/21   [provider]  ondansetron (ZOFRAN) 4 MG tablet Take 4 mg by mouth daily as needed. 10/18/21   [provider]  REPATHA SURECLICK 852 MG/ML SOAJ Inject 140 mg as directed every 14 (fourteen) days. 07/15/21   Croitoru, Dani Gobble, MD  Viviana Simpler Cleaster Corin 200-62.5-25 MCG/ACT AEPB daily. 10/28/21   [provider]      Allergies    Colestipol, Hydrocodone, Niacin and related, Statins, and Zetia [ezetimibe]    Review  of Systems   Review of Systems  All other systems reviewed and are negative.   Physical Exam Updated Vital Signs BP (!) 166/60   Pulse (!) 58   Temp 98.2 F (36.8 C) (Oral)   Resp 16   SpO2 97%  Physical Exam Vitals and nursing note reviewed.  Constitutional:      General: She is not in acute distress.    Appearance: She is well-developed.  HENT:     Head: Atraumatic.  Eyes:     Conjunctiva/sclera: Conjunctivae normal.  Cardiovascular:     Rate and Rhythm: Normal rate and regular rhythm.     Pulses: Normal pulses.     Heart sounds: Normal heart sounds.  Pulmonary:     Effort: Pulmonary effort is normal.  Abdominal:     Palpations: Abdomen is soft.     Tenderness: There is no abdominal tenderness.  Musculoskeletal:        General: Tenderness (Right knee: Tenderness to lateral joint line on palpation, increasing pain with knee flexion and extension however no joint laxity noted edema erythema or warmth appreciated.  Patella is located.) present.     Cervical back: Neck supple.     Comments: Right hip: Tenderness to lateral aspect of the hip at the greater trochanter with normal hip range of motion and no deformity.  Leg is not shortened or externally rotated.  No midline spine tenderness.  Skin:    Capillary Refill: Capillary refill takes less than 2 seconds.     Findings: No rash.  Neurological:     Mental Status: She is alert. Mental status is at baseline.  Psychiatric:        Mood and Affect: Mood normal.     ED Results / Procedures / Treatments   Labs (all labs ordered are listed, but only abnormal results are displayed) Labs Reviewed  CBC WITH DIFFERENTIAL/PLATELET - Abnormal; Notable for the following components:      Result Value   Hemoglobin 11.6 (*)    MCH 25.7 (*)    RDW 17.4 (*)    All other components within normal limits  BASIC METABOLIC PANEL - Abnormal; Notable for the following components:   BUN 24 (*)    Creatinine, Ser 1.35 (*)    GFR,  Estimated 36 (*)    All other components within normal limits    EKG None ED ECG REPORT   Date: 09/16/2022  Rate: 67  Rhythm: normal sinus rhythm  QRS Axis: left  Intervals: normal  ST/T Wave abnormalities: normal  Conduction Disutrbances:none  Narrative Interpretation:   Old EKG Reviewed: unchanged  I have personally reviewed the EKG tracing and agree with the computerized printout as noted.   Radiology DG Hip Unilat W or Wo Pelvis 2-3 Views Right  Result Date: 09/16/2022 CLINICAL DATA:  Trauma, fall  EXAM: DG HIP (WITH OR WITHOUT PELVIS) 2-3V RIGHT COMPARISON:  CT pelvis done on 06/30/2022 FINDINGS: There is previous bilateral hip arthroplasty. No recent fracture or dislocation is seen. Deformity in left inferior pubic ramus most likely is residual change from old healed fracture. Tip of the stem of prosthesis in the proximal shaft is more eccentric in position. Similar finding was seen in the previous CT. IMPRESSION: Previous bilateral hip arthroplasty. No recent fracture or dislocation is seen. Deformity in the left inferior pubic ramus may suggest old healed fracture. Electronically Signed   By: Elmer Picker M.D.   On: 09/16/2022 14:40   DG Knee Complete 4 Views Right  Result Date: 09/16/2022 CLINICAL DATA:  Trauma, fall EXAM: RIGHT KNEE - COMPLETE 4+ VIEW COMPARISON:  09/08/2022 FINDINGS: No recent fracture or dislocation is seen. There is minimal cortical irregularity in the articular surface of patella which has not changed. Questionable subcortical lucency seen in the previous examination has not changed significantly. There is no significant effusion. IMPRESSION: No recent displaced fracture or dislocation is seen. There is no significant effusion. Electronically Signed   By: Elmer Picker M.D.   On: 09/16/2022 14:36    Procedures Procedures    Medications Ordered in ED Medications - No data to display  ED Course/ Medical Decision Making/ A&P                            Medical Decision Making Amount and/or Complexity of Data Reviewed Labs: ordered. Radiology: ordered. ECG/medicine tests: ordered.   BP (!) 179/67 (BP Location: Right Arm)   Pulse 63   Temp 98.2 F (36.8 C) (Oral)   Resp 14   SpO2 95%   26:42 PM 88 year old female significant history of hypertension, prior hip fracture, CAD, paroxysmal atrial flutter on Eliquis, brought here via EMS from home for evaluation of a fall.  Patient report for the past several weeks she has had recurrent pain about her right knee.  Pain is sharp, worsening with ambulation for which she has been seen by her doctor had an x-ray of the right knee which came back negative.  She usually walks with a cane.  Today while sitting in the commode as she was getting up, her right knee gave out and she fell forward towards her walker.  She denies hitting her head or loss of consciousness.  She did not completely fell down to the ground as she did receive help from her home health nurse.  She is here just for further evaluation of her ongoing right knee problem.  She has had history of vasovagal syncope in the bathroom in the past.  She felt this episode is somewhat similar.  On exam, this is an elderly female laying in bed appears to be in no acute discomfort.  Heart lung sounds normal.  Abdomen is soft nontender.  She does not have any midline spine tenderness.  She does have tenderness about her right hip at the lateral trochanter with normal hip range of motion and no shortening or external rotation of the leg.  She has tenderness about her right knee at the lateral joint lines but no obvious deformity no erythema or warmth.  Right ankle is normal.  She has intact dorsalis pedis pulse on right leg.  Patient mention feeling sick prior to her fall.  States that this has happened multiple times in the past whenever she has bowel movement and was told that she  was experiencing vasovagal near syncope.  Given her age, will  obtain basic labs and EKG.  I will also obtain x-ray of the right hip and right knee.  Pain medication offered but patient declined.  Otherwise patient denies any other precipitating symptoms.  Vital signs reviewed and remarkable for elevated blood pressure of 179/67.  She is not hypoxic, she is afebrile.  -Labs ordered, independently viewed and interpreted by me.  Labs remarkable for reassuring labs.  Cr 1.35 which improves from prior -The patient was maintained on a cardiac monitor.  I personally viewed and interpreted the cardiac monitored which showed an underlying rhythm of: sinus bradycardia -Imaging independently viewed and interpreted by me and I agree with radiologist's interpretation.  Result remarkable for Xray of R hip and R knee without acute changes -This patient presents to the ED for concern of fall, this involves an extensive number of treatment options, and is a complaint that carries with it a high risk of complications and morbidity.  The differential diagnosis includes near syncope, hip fx, knee fx, knee sprain, back injury, cardiac arrhythmia, anemia, electrolytes derangement -Co morbidities that complicate the patient evaluation includes PAF, HTN, CKD,  -Treatment includes none -Reevaluation of the patient after these medicines showed that the patient stayed the same -PCP office notes or outside notes reviewed -Discussion with attending Dr. Regenia Skeeter -Escalation to admission/observation considered: patients feels much better, is comfortable with discharge, and will follow up with PCP -Prescription medication considered, patient comfortable with otc tylenol -Social Determinant of Health considered          Final Clinical Impression(s) / ED Diagnoses Final diagnoses:  Sprain of right knee, unspecified ligament, initial encounter  Fall at home, initial encounter    Rx / DC Orders ED Discharge Orders     None         Domenic Moras, PA-C 09/16/22 [employer], MD 09/17/22 [phone]

## 2022-09-16 NOTE — ED Notes (Signed)
Transition of Care [facility]) - Emergency Department Mini Assessment   Patient Details  Name: Amy [name] MRN: [id number] Date of Birth: [birth year]  Transition of Care [facility]) CM/SW Contact:    Iona Beard, [place] Phone Number: 09/16/2022, 3:35 PM   Clinical Narrative: [facility] consulted for [facility] needs. CSW spoke with pts daughter who states that pt was recently D/C from [facility] with [facility]. They are interested in [facility] being set up again and would like to use [facility] again. CSW spoke to Judson Roch with [facility] who states they can accept referral. CSW updated Judson Roch on plan for D/C from ED today. CSW updated MD/PA of need for [facility] PT only orders. PT has a walker to use in the home and has no other DME needs. TOC to follow.   ED Mini Assessment: What brought you to the Emergency Department? : fall  Barriers to Discharge: Barriers Resolved  Barrier interventions: [place] set up     Interventions which prevented an admission or readmission: [place] or Services    Patient Contact and Communications        ,          Patient states their goals for this hospitalization and ongoing recovery are:: return home with [facility] CMS Medicare.gov Compare Post Acute Care list provided to:: Patient Choice offered to / list presented to : Patient  Admission diagnosis:  fall Patient Active Problem List   Diagnosis Date Noted   Hemorrhoids 06/14/2021   Left carotid stenosis 09/14/2018   Other persistent atrial fibrillation ([place]) 09/14/2018   Myositis, statin related 05/24/2018   Hypothyroidism 05/22/2018   Acute loss of vision, left 05/22/2018   Vision loss of left eye 05/22/2018   Chronic diastolic heart failure (HCC) 06/21/2017   Generalized weakness 05/13/2017   Chest pain 05/09/2015   SSS (sick sinus syndrome) ([place]) 11/26/2014   Paroxysmal atrial flutter ([place]) 11/26/2014   Abnormal results of liver function studies 10/01/2014   Anxiety state 08/17/2014   Cough 08/17/2014   Fracture of humeral  head, left, closed 07/22/2014   Multiple pelvic fractures ([place]) 07/22/2014   Fall 07/21/2014   GERD (gastroesophageal reflux disease) 06/09/2014   Unstable angina ([place]) 04/15/2014   C. 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 Cervical esophagoscopy 08/11/2012   S/P right total hip arthroplasty by Dr Noemi Chapel in 2009 08/11/2012   Odynophagia due to new traumatic peri-op esophageal perforation 08/09/2012   Pre-operative clearance 08/08/2012   Fracture of left hip after fall at home. 08/08/2012   Aortic valve sclerosis, no stenosis 08/08/2012   Carotid bruit present bilat, dopplers OK 6/13 08/08/2012   History of UTI- recent completion 10 days Cipro 08/08/2012   Hx of dizziness 08/08/2012   CKD (chronic kidney disease), stage III ([place]) 08/08/2012   PAF/SSS 08/08/2012   Pacemaker March 2010 (MDT) 08/08/2012   DVT after previous hip surgery 08/08/2012   Status post hip hemiarthroplasty left hip by Dr Ronnie Derby 08/08/2012 08/08/2012   Closed fracture of left distal radius s/p ORIF by Dr Burney Gauze 08/08/2012 08/08/2012   Radial head fracture, closed, Lt 08/07/2012   Essential hypertension 08/07/2012  Hypercholesterolemia with LDL greater than 190 mg/dL 08/07/2012   Coronary artery disease involving native coronary artery of native heart without angina pectoris 08/07/2012   DYSPNEA 07/28/2009   PCP:  Practice, [place]:   [place] [address] [id number] [address] [place] [address] Phone: [phone] Fax:  [phone]

## 2022-09-16 NOTE — ED Notes (Signed)
Walked pt in room. She was only able to walk a couple steps without having to stop. She stated she started to feel sick so I pulled a chair behind her so she could sit.

## 2022-09-16 NOTE — ED Notes (Signed)
Ace wrap applied to R knee

## 2022-09-16 NOTE — Discharge Instructions (Signed)
You have been evaluated for your fall.  Fortunately x-ray of your right hip and right knee did not show any broken bone or dislocation.  Your pain is likely due to a knee sprain/strain.  You may take over-the-counter Tylenol as needed for pain.  Keep the Ace wrap on your knee for support.  Rest your knee when possible.  Use walker at all times.  Follow-up with your doctor for further care.  Our transition of care to social work will reach out to you in the next several days to help with additional home health need.

## 2022-09-16 NOTE — ED Triage Notes (Addendum)
Pt from  home via [facility] with reports of fall. Pt stating she stood up from toilet and "my knee buckled." Pt C/O right hip/knee/ankle pain. No deformity noted.

## 2022-09-20 ENCOUNTER — Emergency Department (HOSPITAL_COMMUNITY): Payer: Medicare Other

## 2022-09-20 ENCOUNTER — Encounter (HOSPITAL_COMMUNITY): Payer: Self-pay

## 2022-09-20 ENCOUNTER — Emergency Department (HOSPITAL_COMMUNITY)
Admission: EM | Admit: 2022-09-20 | Discharge: 2022-09-20 | Disposition: A | Discharge status: Home / Self Care | Payer: Medicare Other | Attending: Emergency Medicine | Admitting: Emergency Medicine

## 2022-09-20 ENCOUNTER — Other Ambulatory Visit: Payer: Self-pay

## 2022-09-20 DIAGNOSIS — I130 Hypertensive heart and chronic kidney disease with heart failure and stage 1 through stage 4 chronic kidney disease, or unspecified chronic kidney disease: Secondary | ICD-10-CM | POA: Diagnosis not present

## 2022-09-20 DIAGNOSIS — Z950 Presence of cardiac pacemaker: Secondary | ICD-10-CM | POA: Diagnosis not present

## 2022-09-20 DIAGNOSIS — J984 Other disorders of lung: Secondary | ICD-10-CM | POA: Diagnosis not present

## 2022-09-20 DIAGNOSIS — I2510 Atherosclerotic heart disease of native coronary artery without angina pectoris: Secondary | ICD-10-CM | POA: Diagnosis not present

## 2022-09-20 DIAGNOSIS — W19XXXD Unspecified fall, subsequent encounter: Secondary | ICD-10-CM | POA: Diagnosis not present

## 2022-09-20 DIAGNOSIS — S301XXA Contusion of abdominal wall, initial encounter: Secondary | ICD-10-CM | POA: Insufficient documentation

## 2022-09-20 DIAGNOSIS — Y92019 Unspecified place in single-family (private) house as the place of occurrence of the external cause: Secondary | ICD-10-CM | POA: Insufficient documentation

## 2022-09-20 DIAGNOSIS — R109 Unspecified abdominal pain: Secondary | ICD-10-CM

## 2022-09-20 DIAGNOSIS — Z7901 Long term (current) use of anticoagulants: Secondary | ICD-10-CM | POA: Diagnosis not present

## 2022-09-20 DIAGNOSIS — Z79899 Other long term (current) drug therapy: Secondary | ICD-10-CM | POA: Insufficient documentation

## 2022-09-20 DIAGNOSIS — S79911A Unspecified injury of right hip, initial encounter: Secondary | ICD-10-CM | POA: Diagnosis not present

## 2022-09-20 DIAGNOSIS — N189 Chronic kidney disease, unspecified: Secondary | ICD-10-CM | POA: Diagnosis not present

## 2022-09-20 DIAGNOSIS — I700 Atherosclerosis of aorta: Secondary | ICD-10-CM | POA: Diagnosis not present

## 2022-09-20 DIAGNOSIS — R1031 Right lower quadrant pain: Secondary | ICD-10-CM | POA: Diagnosis not present

## 2022-09-20 DIAGNOSIS — I509 Heart failure, unspecified: Secondary | ICD-10-CM | POA: Insufficient documentation

## 2022-09-20 DIAGNOSIS — M25561 Pain in right knee: Secondary | ICD-10-CM | POA: Diagnosis not present

## 2022-09-20 DIAGNOSIS — K5730 Diverticulosis of large intestine without perforation or abscess without bleeding: Secondary | ICD-10-CM | POA: Diagnosis not present

## 2022-09-20 DIAGNOSIS — N200 Calculus of kidney: Secondary | ICD-10-CM | POA: Diagnosis not present

## 2022-09-20 DIAGNOSIS — N281 Cyst of kidney, acquired: Secondary | ICD-10-CM | POA: Diagnosis not present

## 2022-09-20 DIAGNOSIS — E039 Hypothyroidism, unspecified: Secondary | ICD-10-CM | POA: Insufficient documentation

## 2022-09-20 DIAGNOSIS — R0789 Other chest pain: Secondary | ICD-10-CM | POA: Insufficient documentation

## 2022-09-20 DIAGNOSIS — S3991XA Unspecified injury of abdomen, initial encounter: Secondary | ICD-10-CM | POA: Diagnosis present

## 2022-09-20 DIAGNOSIS — J8410 Pulmonary fibrosis, unspecified: Secondary | ICD-10-CM | POA: Diagnosis not present

## 2022-09-20 DIAGNOSIS — S32502A Unspecified fracture of left pubis, initial encounter for closed fracture: Secondary | ICD-10-CM | POA: Diagnosis not present

## 2022-09-20 DIAGNOSIS — R918 Other nonspecific abnormal finding of lung field: Secondary | ICD-10-CM | POA: Diagnosis not present

## 2022-09-20 LAB — COMPREHENSIVE METABOLIC PANEL
ALT: 24 U/L (ref 0–44)
AST: 33 U/L (ref 15–41)
Albumin: 2.9 g/dL — ABNORMAL LOW (ref 3.5–5.0)
Alkaline Phosphatase: 109 U/L (ref 38–126)
Anion gap: 6 (ref 5–15)
BUN: 14 mg/dL (ref 8–23)
CO2: 29 mmol/L (ref 22–32)
Calcium: 9.1 mg/dL (ref 8.9–10.3)
Chloride: 100 mmol/L (ref 98–111)
Creatinine, Ser: 1.25 mg/dL — ABNORMAL HIGH (ref 0.44–1.00)
GFR, Estimated: 39 mL/min — ABNORMAL LOW (ref >60)
Glucose, Bld: 109 mg/dL — ABNORMAL HIGH (ref 70–99)
Potassium: 4 mmol/L (ref 3.5–5.1)
Sodium: 135 mmol/L (ref 135–145)
Total Bilirubin: 0.6 mg/dL (ref 0.3–1.2)
Total Protein: 6 g/dL — ABNORMAL LOW (ref 6.5–8.1)

## 2022-09-20 LAB — CBC WITH DIFFERENTIAL/PLATELET
Abs Immature Granulocytes: 0.02 10*3/uL (ref 0.00–0.07)
Basophils Absolute: 0.1 10*3/uL (ref 0.0–0.1)
Basophils Relative: 1 %
Eosinophils Absolute: 0.3 10*3/uL (ref 0.0–0.5)
Eosinophils Relative: 4 %
HCT: 38.9 % (ref 36.0–46.0)
Hemoglobin: 12 g/dL (ref 12.0–15.0)
Immature Granulocytes: 0 %
Lymphocytes Relative: 20 %
Lymphs Abs: 1.5 10*3/uL (ref 0.7–4.0)
MCH: 26.1 pg (ref 26.0–34.0)
MCHC: 30.8 g/dL (ref 30.0–36.0)
MCV: 84.7 fL (ref 80.0–100.0)
Monocytes Absolute: 0.7 10*3/uL (ref 0.1–1.0)
Monocytes Relative: 9 %
Neutro Abs: 4.7 10*3/uL (ref 1.7–7.7)
Neutrophils Relative %: 66 %
Platelets: 214 10*3/uL (ref 150–400)
RBC: 4.59 MIL/uL (ref 3.87–5.11)
RDW: 17 % — ABNORMAL HIGH (ref 11.5–15.5)
WBC: 7.1 10*3/uL (ref 4.0–10.5)
nRBC: 0.4 % — ABNORMAL HIGH (ref 0.0–0.2)

## 2022-09-20 LAB — MAGNESIUM: Magnesium: 1.8 mg/dL (ref 1.7–2.4)

## 2022-09-20 MED ORDER — OXYCODONE-ACETAMINOPHEN 5-325 MG PO TABS: 1.0000 | ORAL_TABLET | Freq: Three times a day (TID) | ORAL | 0 refills | Status: AC | PRN

## 2022-09-20 MED ORDER — OXYCODONE-ACETAMINOPHEN 5-325 MG PO TABS
1.0000 | ORAL_TABLET | Freq: Once | ORAL | Status: AC
  Administered 2022-09-20: 1 via ORAL
  Filled 2022-09-20: qty 1

## 2022-09-20 NOTE — Discharge Instructions (Signed)
A prescription for oxycodone was sent to your pharmacy.  This is a narcotic medication.  Take only as needed.  It does contain Tylenol.  Avoid exceeding more than 4000 mg of Tylenol total in a day.  Return to the emergency department for any new or worsening symptoms of concern.

## 2022-09-20 NOTE — ED Provider Notes (Addendum)
[facility] EMERGENCY DEPARTMENT Provider Note   CSN: [id number] Arrival date & time: 09/20/22  1694     History  Chief Complaint  Patient presents with   Rib Injury    Amy [name] is a 88 y.o. female.  HPI Patient resents for right flank pain.  Medical history includes HLD, HTN, CAD, CKD, provoked DVT, GERD, anxiety, flutter, sick sinus syndrome s/p pacemaker, CHF, hypothyroidism.  4 days ago, she had a mechanical fall.  During that time, she landed on her right side.  She was seen in the ED and discharged with instructions to take Tylenol for pain control.  Since that time, she has had uncontrolled pain.  She describes the pain as located primarily in her right flank but also present in right knee, upper leg, and hip.  Because of the pain, patient has stayed in the bed.  There were some tramadol at the house and patient was started to take those.  She was able to be assisted to a chair in order to change the linens on her bed yesterday.  This required 2 person assist.  She does report that her pain is slightly improved since 4 days ago.  She has a physical therapy is scheduled to come by tomorrow.  She has not had any falls since 4 days ago.      Home Medications Prior to Admission medications   Medication Sig Start Date End Date Taking? Authorizing Provider  oxyCODONE-acetaminophen (PERCOCET/ROXICET) 5-325 MG tablet Take 1 tablet by mouth every 8 (eight) hours as needed for up to 4 days for severe pain. 09/20/22 09/24/22 Yes Godfrey Pick, MD  acetaminophen (TYLENOL) 500 MG tablet Take 1,000 mg by mouth every 6 (six) hours as needed for moderate pain.    [provider]  albuterol (PROVENTIL HFA;VENTOLIN HFA) 108 (90 Base) MCG/ACT inhaler Inhale 2 puffs into the lungs every 6 (six) hours as needed for wheezing or shortness of breath.    [provider]  allopurinol (ZYLOPRIM) 100 MG tablet Take 100 mg by mouth daily. 05/10/18   [provider]  calcium  carbonate (TUMS - DOSED IN MG ELEMENTAL CALCIUM) 500 MG chewable tablet Chew 2 tablets by mouth daily as needed for indigestion or heartburn.    [provider]  cetirizine (ZYRTEC) 10 MG tablet Take 10 mg by mouth daily.    [provider]  Cholecalciferol (VITAMIN D3 PO) Take 1 tablet by mouth daily.    [provider]  cycloSPORINE (RESTASIS) 0.05 % ophthalmic emulsion Place 1 drop into both eyes 2 (two) times daily.    [provider]  cycloSPORINE (RESTASIS) 0.05 % ophthalmic emulsion Place 1 drop into both eyes 2 (two) times daily.    [provider]  docusate sodium (COLACE) 100 MG capsule Take 100 mg by mouth 2 (two) times daily.    [provider]  ELIQUIS 2.5 MG TABS tablet Take 1 tablet by mouth twice daily 10/28/19   Croitoru, Mihai, MD  EUTHYROX 75 MCG tablet Take 75 mcg by mouth every morning. 02/07/21   [provider]  furosemide (LASIX) 40 MG tablet Take 1 tablet (40 mg total) by mouth daily. 12/20/21   Croitoru, Mihai, MD  furosemide (LASIX) 40 MG tablet Take 40 mg by mouth every other day. 10/18/21   [provider]  gabapentin (NEURONTIN) 100 MG capsule Take 1 capsule (100 mg total) by mouth 2 (two) times daily. 05/19/20   Croitoru, Dani Gobble, MD  levothyroxine (SYNTHROID)  75 MCG tablet Take 1 tablet by mouth daily. 11/24/21   [provider]  LORazepam (ATIVAN) 0.5 MG tablet Take 0.5-1 mg by mouth at bedtime as needed. 12/24/20   [provider]  Magnesium 500 MG TABS Take 1 tablet by mouth daily in the afternoon.    [provider]  metoprolol succinate (TOPROL-XL) 25 MG 24 hr tablet Take 1 tablet by mouth once daily 08/21/20   Croitoru, Mihai, MD  nitroGLYCERIN (NITROSTAT) 0.4 MG SL tablet Place 1 tablet (0.4 mg total) under the tongue every 5 (five) minutes x 3 doses as needed for chest pain. Patient not taking: Reported on 06/20/2022 04/17/14   Barrett, Evelene Croon, PA-C  omeprazole  (PRILOSEC) 20 MG capsule Take 20 mg by mouth 2 (two) times daily. 10/18/21   [provider]  ondansetron (ZOFRAN) 4 MG tablet Take 4 mg by mouth daily as needed. 10/18/21   [provider]  REPATHA SURECLICK 761 MG/ML SOAJ Inject 140 mg as directed every 14 (fourteen) days. 07/15/21   Croitoru, Dani Gobble, MD  Viviana Simpler Cleaster Corin 200-62.5-25 MCG/ACT AEPB daily. 10/28/21   [provider]      Allergies    Colestipol, Hydrocodone, Niacin and related, Statins, and Zetia [ezetimibe]    Review of Systems   Review of Systems  Genitourinary:  Positive for flank pain.  Musculoskeletal:  Positive for arthralgias.  All other systems reviewed and are negative.   Physical Exam Updated Vital Signs BP (!) 139/52   Pulse 60   Temp 98.6 F (37 C) (Oral)   Resp 19   Ht 5' 5.5" (1.664 m)   Wt 77.1 kg   SpO2 95%   BMI 27.86 kg/m  Physical Exam Vitals and nursing note reviewed.  Constitutional:      General: She is not in acute distress.    Appearance: Normal appearance. She is well-developed. She is not ill-appearing, toxic-appearing or diaphoretic.  HENT:     Head: Normocephalic and atraumatic.     Right Ear: External ear normal.     Left Ear: External ear normal.     Nose: Nose normal.     Mouth/Throat:     Mouth: Mucous membranes are moist.     Pharynx: Oropharynx is clear.  Eyes:     Extraocular Movements: Extraocular movements intact.     Conjunctiva/sclera: Conjunctivae normal.  Cardiovascular:     Rate and Rhythm: Normal rate and regular rhythm.  Pulmonary:     Effort: Pulmonary effort is normal. No respiratory distress.  Chest:     Chest wall: Tenderness present.  Abdominal:     General: There is no distension.     Palpations: Abdomen is soft.     Tenderness: There is abdominal tenderness. There is no guarding or rebound.  Musculoskeletal:        General: No swelling or deformity.     Cervical back: Normal range of motion and neck supple.     Right  lower leg: No edema.     Left lower leg: No edema.  Skin:    General: Skin is warm and dry.     Coloration: Skin is not jaundiced or pale.     Findings: Bruising (Right posterior flank) present.  Neurological:     General: No focal deficit present.     Mental Status: She is alert and oriented to person, place, and time.     Cranial Nerves: No cranial nerve deficit.     Sensory: No sensory deficit.  Motor: No weakness.     Coordination: Coordination normal.  Psychiatric:        Mood and Affect: Mood normal.        Behavior: Behavior normal.        Thought Content: Thought content normal.        Judgment: Judgment normal.     ED Results / Procedures / Treatments   Labs (all labs ordered are listed, but only abnormal results are displayed) Labs Reviewed  CBC WITH DIFFERENTIAL/PLATELET - Abnormal; Notable for the following components:      Result Value   RDW 17.0 (*)    nRBC 0.4 (*)    All other components within normal limits  COMPREHENSIVE METABOLIC PANEL - Abnormal; Notable for the following components:   Glucose, Bld 109 (*)    Creatinine, Ser 1.25 (*)    Total Protein 6.0 (*)    Albumin 2.9 (*)    GFR, Estimated 39 (*)    All other components within normal limits  MAGNESIUM    EKG None  Radiology CT HIP RIGHT WO CONTRAST  Result Date: 09/20/2022 CLINICAL DATA:  Hip trauma, fracture suspected, xray done EXAM: CT OF THE RIGHT HIP WITHOUT CONTRAST TECHNIQUE: Multidetector CT imaging of the right hip was performed according to the standard protocol. Multiplanar CT image reconstructions were also generated. RADIATION DOSE REDUCTION: This exam was performed according to the departmental dose-optimization program which includes automated exposure control, adjustment of the mA and/or kV according to patient size and/or use of iterative reconstruction technique. COMPARISON:  Right hip radiograph 12/03/2012, radiograph 09/16/2022. FINDINGS: Bones/Joint/Cartilage Previous  right total hip arthroplasty without evidence of periprosthetic fracture. In comparison to prior hip radiograph in February 2014, there is femoral stem subsidence progressive, 3 mm lucency at the cement bone interface of the distal femoral stem and 5 mm at the proximal femoral stem. There is chronic lateral cortical thinning of the proximal femur with slight femoral stem protrusion but no acute cortical breakthrough. Ligaments Suboptimally assessed by CT. Muscles and Tendons No acute myotendinous abnormality by CT. There is generalized loss of muscle bulk and fatty atrophy of the gluteus musculature. Soft tissues No focal fluid collection. IMPRESSION: Right total hip arthroplasty without evidence of periprosthetic fracture. In comparison to radiograph in February 2014, there is femoral stem subsidence and evidence of loosening along the far distal and proximal aspects of the femoral stem. Chronic lateral femoral cortical thinning with slight distal femoral stem protrusion but no acute cortical breakthrough. Electronically Signed   By: Maurine Simmering M.D.   On: 09/20/2022 11:23   CT CHEST ABDOMEN PELVIS WO CONTRAST  Result Date: 09/20/2022 CLINICAL DATA:  Multiple falls, right rib pain EXAM: CT CHEST, ABDOMEN AND PELVIS WITHOUT CONTRAST TECHNIQUE: Multidetector CT imaging of the chest, abdomen and pelvis was performed following the standard protocol without IV contrast. RADIATION DOSE REDUCTION: This exam was performed according to the departmental dose-optimization program which includes automated exposure control, adjustment of the mA and/or kV according to patient size and/or use of iterative reconstruction technique. COMPARISON:  CT pelvis, 06/30/2022, CT chest, 03/19/2021 FINDINGS: CT CHEST FINDINGS Cardiovascular: Aortic atherosclerosis. Left chest multi lead pacer. Normal heart size. Three-vessel coronary artery calcifications. No pericardial effusion. Mediastinum/Nodes: No enlarged mediastinal, hilar, or  axillary lymph nodes. Thyroid gland, trachea, and esophagus demonstrate no significant findings. Lungs/Pleura: Scattered heterogeneous and ground-glass airspace opacity in the posterior right upper lobe (series 3, image 40). Subpleural fibrotic scarring of the anterior left upper lobe and lingula,  possibly related to prior radiation therapy (series 3, image 67). No pleural effusion or pneumothorax. Musculoskeletal: No chest wall abnormality. No acute osseous findings. CT ABDOMEN PELVIS FINDINGS Hepatobiliary: No solid liver abnormality is seen. No gallstones, gallbladder wall thickening, or biliary dilatation. Pancreas: Unremarkable. No pancreatic ductal dilatation or surrounding inflammatory changes. Spleen: Normal in size without significant abnormality. Adrenals/Urinary Tract: Adrenal glands are unremarkable. Multiple simple, benign bilateral renal cortical cysts, for which no further follow-up or characterization is required. Small nonobstructive calculus of the midportion of the left kidney (series 5, image 96). No ureteral calculi or hydronephrosis dense metallic streak artifact from bilateral hip total arthroplasty significantly limits evaluation of the low pelvis. Within this limitation, no obvious abnormality of the bladder. Stomach/Bowel: Stomach is within normal limits. Appendix appears normal. No evidence of bowel wall thickening, distention, or inflammatory changes. Sigmoid diverticulosis. Vascular/Lymphatic: Aortic atherosclerosis. No enlarged abdominal or pelvic lymph nodes. Reproductive: Dense metallic streak artifact from bilateral hip total arthroplasty significantly limits evaluation of the low pelvis. Within this limitation, status post hysterectomy without other obvious abnormality. Other: Fat containing epigastric hernia (series 2, image 69). No ascites. Musculoskeletal: No acute osseous findings. Status post bilateral hip total arthroplasty. Chronic fracture deformity of the left inferior  pubic ramus (series 2, image 115). IMPRESSION: 1. No noncontrast CT evidence of acute traumatic injury to the chest, abdomen, or pelvis. Specifically, no evidence of displaced right rib fracture per stated concern. 2. Scattered heterogeneous and ground-glass airspace opacity in the posterior right upper lobe, nonspecific and infectious or inflammatory. 3. Nonobstructive left nephrolithiasis. 4. Status post bilateral hip total arthroplasty. 5. Coronary artery disease. Aortic Atherosclerosis (ICD10-I70.0). Electronically Signed   By: Delanna Ahmadi M.D.   On: 09/20/2022 10:39    Procedures Procedures    Medications Ordered in ED Medications  oxyCODONE-acetaminophen (PERCOCET/ROXICET) 5-325 MG per tablet 1 tablet (1 tablet Oral Given 09/20/22 0957)    ED Course/ Medical Decision Making/ A&P                           Medical Decision Making Amount and/or Complexity of Data Reviewed Labs: ordered. Radiology: ordered.  Risk Prescription drug management.   This patient presents to the ED for concern of right flank pain, this involves an extensive number of treatment options, and is a complaint that carries with it a high risk of complications and morbidity.  The differential diagnosis includes rib injury, bruising, retroperitoneal hematoma, liver injury   Co morbidities that complicate the patient evaluation  HLD, HTN, CAD, CKD, provoked DVT, GERD, anxiety, flutter, sick sinus syndrome s/p pacemaker, CHF, hypothyroidism   Additional history obtained:  Additional history obtained from patient's daughter External records from outside source obtained and reviewed including EMR   Lab Tests:  I Ordered, and personally interpreted labs.  The pertinent results include: Normal hemoglobin, no leukocytosis, creatinine slightly improved from baseline, normal hepatobiliary enzymes   Imaging Studies ordered:  I ordered imaging studies including CT of chest, abdomen, pelvis, right hip I  independently visualized and interpreted imaging which showed no acute injuries.  There is scattered heterogeneous airspace opacity in posterior right lower lobe. I agree with the radiologist interpretation   Cardiac Monitoring: / EKG:  The patient was maintained on a cardiac monitor.  I personally viewed and interpreted the cardiac monitored which showed an underlying rhythm of: Sinus rhythm   Problem List / ED Course / Critical interventions / Medication management  Patient presents for  right leg pain that has been persistent since a fall that occurred 4 days ago.  Fall at the time was mechanical in nature.  She was evaluated in the emergency department at that time.  Per chart review, she underwent the following imaging studies: Right knee x-ray, right hip x-ray.  She has been treating her pain at home with Tylenol and tramadol.  Due to the pain, she has not been able to leave the bed.  She describes pain throughout her right flank.  Today in the ED, patient was given Percocet for analgesia.  CT imaging was ordered to assess her right flank and lower rib pain.  CT imaging was negative for acute injuries.  There was an area of heterogeneous scattered groundglass opacities in posterior right lower lobe.  This does raise concern for pneumonia which may be causing her right flank pain.  I discussed this with patient and her daughter.  Patient's daughter reports that patient did have a cough several weeks ago and was treated with steroids and antibiotics.  Her cough has resolved and she has not had any further infectious symptoms.  Patient and daughter state that they are trying to limit the use of antibiotics because she does have a history of C. difficile.  On lab work, no leukocytosis is present.  Given his nonspecific finding on CT in the absence of any leukocytosis or respiratory symptoms, plan from shared decision-making discussion was to not initiate antibiotics but, to rather treat musculoskeletal  pain, which has been improving over the past 4 days.  Patient did report good relief of pain with single dose of Percocet in the ED.  This was prescribed for as needed use at home.  Patient was discharged in good condition. I ordered medication including Percocet for analgesia Reevaluation of the patient after these medicines showed that the patient improved I have reviewed the patients home medicines and have made adjustments as needed   Social Determinants of Health:  Lives at home with family, starting PT at home tomorrow         Final Clinical Impression(s) / ED Diagnoses Final diagnoses:  Right flank pain  Fall, subsequent encounter    Rx / DC Orders ED Discharge Orders          Ordered    oxyCODONE-acetaminophen (PERCOCET/ROXICET) 5-325 MG tablet  Every 8 hours PRN        09/20/22 1139              Godfrey Pick, MD 09/20/22 1138    Godfrey Pick, MD 09/20/22 1141

## 2022-09-20 NOTE — ED Triage Notes (Signed)
"  Seen here Friday after a fall, fell again on Friday evening after she got home. Is having a lot of pain in right rib area and worried she may have a broken rib" per daughter

## 2022-09-21 DIAGNOSIS — Z96643 Presence of artificial hip joint, bilateral: Secondary | ICD-10-CM | POA: Diagnosis not present

## 2022-09-21 DIAGNOSIS — Z602 Problems related to living alone: Secondary | ICD-10-CM | POA: Diagnosis not present

## 2022-09-21 DIAGNOSIS — S8391XD Sprain of unspecified site of right knee, subsequent encounter: Secondary | ICD-10-CM | POA: Diagnosis not present

## 2022-09-21 DIAGNOSIS — Z950 Presence of cardiac pacemaker: Secondary | ICD-10-CM | POA: Diagnosis not present

## 2022-09-21 DIAGNOSIS — I700 Atherosclerosis of aorta: Secondary | ICD-10-CM | POA: Diagnosis not present

## 2022-09-21 DIAGNOSIS — I495 Sick sinus syndrome: Secondary | ICD-10-CM | POA: Diagnosis not present

## 2022-09-21 DIAGNOSIS — F419 Anxiety disorder, unspecified: Secondary | ICD-10-CM | POA: Diagnosis not present

## 2022-09-21 DIAGNOSIS — N183 Chronic kidney disease, stage 3 unspecified: Secondary | ICD-10-CM | POA: Diagnosis not present

## 2022-09-21 DIAGNOSIS — Z7901 Long term (current) use of anticoagulants: Secondary | ICD-10-CM | POA: Diagnosis not present

## 2022-09-21 DIAGNOSIS — I6522 Occlusion and stenosis of left carotid artery: Secondary | ICD-10-CM | POA: Diagnosis not present

## 2022-09-21 DIAGNOSIS — Z9181 History of falling: Secondary | ICD-10-CM | POA: Diagnosis not present

## 2022-09-21 DIAGNOSIS — E039 Hypothyroidism, unspecified: Secondary | ICD-10-CM | POA: Diagnosis not present

## 2022-09-21 DIAGNOSIS — D62 Acute posthemorrhagic anemia: Secondary | ICD-10-CM | POA: Diagnosis not present

## 2022-09-21 DIAGNOSIS — R300 Dysuria: Secondary | ICD-10-CM | POA: Diagnosis not present

## 2022-09-21 DIAGNOSIS — I130 Hypertensive heart and chronic kidney disease with heart failure and stage 1 through stage 4 chronic kidney disease, or unspecified chronic kidney disease: Secondary | ICD-10-CM | POA: Diagnosis not present

## 2022-09-21 DIAGNOSIS — R001 Bradycardia, unspecified: Secondary | ICD-10-CM | POA: Diagnosis not present

## 2022-09-21 DIAGNOSIS — Z86718 Personal history of other venous thrombosis and embolism: Secondary | ICD-10-CM | POA: Diagnosis not present

## 2022-09-21 DIAGNOSIS — I4892 Unspecified atrial flutter: Secondary | ICD-10-CM | POA: Diagnosis not present

## 2022-09-21 DIAGNOSIS — Z79899 Other long term (current) drug therapy: Secondary | ICD-10-CM | POA: Diagnosis not present

## 2022-09-21 DIAGNOSIS — I25110 Atherosclerotic heart disease of native coronary artery with unstable angina pectoris: Secondary | ICD-10-CM | POA: Diagnosis not present

## 2022-09-21 DIAGNOSIS — I4819 Other persistent atrial fibrillation: Secondary | ICD-10-CM | POA: Diagnosis not present

## 2022-09-21 DIAGNOSIS — I5032 Chronic diastolic (congestive) heart failure: Secondary | ICD-10-CM | POA: Diagnosis not present

## 2022-09-27 DIAGNOSIS — R030 Elevated blood-pressure reading, without diagnosis of hypertension: Secondary | ICD-10-CM | POA: Diagnosis not present

## 2022-09-27 DIAGNOSIS — Z6827 Body mass index (BMI) 27.0-27.9, adult: Secondary | ICD-10-CM | POA: Diagnosis not present

## 2022-09-27 DIAGNOSIS — F17210 Nicotine dependence, cigarettes, uncomplicated: Secondary | ICD-10-CM | POA: Diagnosis not present

## 2022-09-27 DIAGNOSIS — R197 Diarrhea, unspecified: Secondary | ICD-10-CM | POA: Diagnosis not present

## 2022-09-27 DIAGNOSIS — R42 Dizziness and giddiness: Secondary | ICD-10-CM | POA: Diagnosis not present

## 2022-09-27 DIAGNOSIS — M25569 Pain in unspecified knee: Secondary | ICD-10-CM | POA: Diagnosis not present

## 2022-09-28 DIAGNOSIS — F419 Anxiety disorder, unspecified: Secondary | ICD-10-CM | POA: Diagnosis not present

## 2022-09-28 DIAGNOSIS — I130 Hypertensive heart and chronic kidney disease with heart failure and stage 1 through stage 4 chronic kidney disease, or unspecified chronic kidney disease: Secondary | ICD-10-CM | POA: Diagnosis not present

## 2022-09-28 DIAGNOSIS — I5032 Chronic diastolic (congestive) heart failure: Secondary | ICD-10-CM | POA: Diagnosis not present

## 2022-09-28 DIAGNOSIS — N183 Chronic kidney disease, stage 3 unspecified: Secondary | ICD-10-CM | POA: Diagnosis not present

## 2022-09-28 DIAGNOSIS — I25110 Atherosclerotic heart disease of native coronary artery with unstable angina pectoris: Secondary | ICD-10-CM | POA: Diagnosis not present

## 2022-09-28 DIAGNOSIS — R197 Diarrhea, unspecified: Secondary | ICD-10-CM | POA: Diagnosis not present

## 2022-09-28 DIAGNOSIS — S8391XD Sprain of unspecified site of right knee, subsequent encounter: Secondary | ICD-10-CM | POA: Diagnosis not present

## 2022-09-30 DIAGNOSIS — I5032 Chronic diastolic (congestive) heart failure: Secondary | ICD-10-CM | POA: Diagnosis not present

## 2022-09-30 DIAGNOSIS — F419 Anxiety disorder, unspecified: Secondary | ICD-10-CM | POA: Diagnosis not present

## 2022-09-30 DIAGNOSIS — N183 Chronic kidney disease, stage 3 unspecified: Secondary | ICD-10-CM | POA: Diagnosis not present

## 2022-09-30 DIAGNOSIS — S8391XD Sprain of unspecified site of right knee, subsequent encounter: Secondary | ICD-10-CM | POA: Diagnosis not present

## 2022-09-30 DIAGNOSIS — I130 Hypertensive heart and chronic kidney disease with heart failure and stage 1 through stage 4 chronic kidney disease, or unspecified chronic kidney disease: Secondary | ICD-10-CM | POA: Diagnosis not present

## 2022-09-30 DIAGNOSIS — I25110 Atherosclerotic heart disease of native coronary artery with unstable angina pectoris: Secondary | ICD-10-CM | POA: Diagnosis not present

## 2022-10-04 DIAGNOSIS — I5032 Chronic diastolic (congestive) heart failure: Secondary | ICD-10-CM | POA: Diagnosis not present

## 2022-10-04 DIAGNOSIS — I130 Hypertensive heart and chronic kidney disease with heart failure and stage 1 through stage 4 chronic kidney disease, or unspecified chronic kidney disease: Secondary | ICD-10-CM | POA: Diagnosis not present

## 2022-10-04 DIAGNOSIS — S8391XD Sprain of unspecified site of right knee, subsequent encounter: Secondary | ICD-10-CM | POA: Diagnosis not present

## 2022-10-04 DIAGNOSIS — F419 Anxiety disorder, unspecified: Secondary | ICD-10-CM | POA: Diagnosis not present

## 2022-10-04 DIAGNOSIS — I25110 Atherosclerotic heart disease of native coronary artery with unstable angina pectoris: Secondary | ICD-10-CM | POA: Diagnosis not present

## 2022-10-04 DIAGNOSIS — N183 Chronic kidney disease, stage 3 unspecified: Secondary | ICD-10-CM | POA: Diagnosis not present

## 2022-10-06 DIAGNOSIS — I5032 Chronic diastolic (congestive) heart failure: Secondary | ICD-10-CM | POA: Diagnosis not present

## 2022-10-06 DIAGNOSIS — S8391XD Sprain of unspecified site of right knee, subsequent encounter: Secondary | ICD-10-CM | POA: Diagnosis not present

## 2022-10-06 DIAGNOSIS — N183 Chronic kidney disease, stage 3 unspecified: Secondary | ICD-10-CM | POA: Diagnosis not present

## 2022-10-06 DIAGNOSIS — I25110 Atherosclerotic heart disease of native coronary artery with unstable angina pectoris: Secondary | ICD-10-CM | POA: Diagnosis not present

## 2022-10-06 DIAGNOSIS — F419 Anxiety disorder, unspecified: Secondary | ICD-10-CM | POA: Diagnosis not present

## 2022-10-06 DIAGNOSIS — I130 Hypertensive heart and chronic kidney disease with heart failure and stage 1 through stage 4 chronic kidney disease, or unspecified chronic kidney disease: Secondary | ICD-10-CM | POA: Diagnosis not present

## 2022-10-10 DIAGNOSIS — N390 Urinary tract infection, site not specified: Secondary | ICD-10-CM | POA: Diagnosis not present

## 2022-10-10 DIAGNOSIS — R300 Dysuria: Secondary | ICD-10-CM | POA: Diagnosis not present

## 2022-10-10 DIAGNOSIS — F17210 Nicotine dependence, cigarettes, uncomplicated: Secondary | ICD-10-CM | POA: Diagnosis not present

## 2022-10-11 DIAGNOSIS — I5032 Chronic diastolic (congestive) heart failure: Secondary | ICD-10-CM | POA: Diagnosis not present

## 2022-10-11 DIAGNOSIS — S8391XD Sprain of unspecified site of right knee, subsequent encounter: Secondary | ICD-10-CM | POA: Diagnosis not present

## 2022-10-11 DIAGNOSIS — I25110 Atherosclerotic heart disease of native coronary artery with unstable angina pectoris: Secondary | ICD-10-CM | POA: Diagnosis not present

## 2022-10-11 DIAGNOSIS — I130 Hypertensive heart and chronic kidney disease with heart failure and stage 1 through stage 4 chronic kidney disease, or unspecified chronic kidney disease: Secondary | ICD-10-CM | POA: Diagnosis not present

## 2022-10-11 DIAGNOSIS — N183 Chronic kidney disease, stage 3 unspecified: Secondary | ICD-10-CM | POA: Diagnosis not present

## 2022-10-11 DIAGNOSIS — F419 Anxiety disorder, unspecified: Secondary | ICD-10-CM | POA: Diagnosis not present

## 2022-10-14 DIAGNOSIS — I130 Hypertensive heart and chronic kidney disease with heart failure and stage 1 through stage 4 chronic kidney disease, or unspecified chronic kidney disease: Secondary | ICD-10-CM | POA: Diagnosis not present

## 2022-10-14 DIAGNOSIS — N183 Chronic kidney disease, stage 3 unspecified: Secondary | ICD-10-CM | POA: Diagnosis not present

## 2022-10-14 DIAGNOSIS — I5032 Chronic diastolic (congestive) heart failure: Secondary | ICD-10-CM | POA: Diagnosis not present

## 2022-10-14 DIAGNOSIS — S8391XD Sprain of unspecified site of right knee, subsequent encounter: Secondary | ICD-10-CM | POA: Diagnosis not present

## 2022-10-14 DIAGNOSIS — F419 Anxiety disorder, unspecified: Secondary | ICD-10-CM | POA: Diagnosis not present

## 2022-10-14 DIAGNOSIS — I25110 Atherosclerotic heart disease of native coronary artery with unstable angina pectoris: Secondary | ICD-10-CM | POA: Diagnosis not present

## 2022-10-17 DIAGNOSIS — F419 Anxiety disorder, unspecified: Secondary | ICD-10-CM | POA: Diagnosis not present

## 2022-10-17 DIAGNOSIS — N183 Chronic kidney disease, stage 3 unspecified: Secondary | ICD-10-CM | POA: Diagnosis not present

## 2022-10-17 DIAGNOSIS — S8391XD Sprain of unspecified site of right knee, subsequent encounter: Secondary | ICD-10-CM | POA: Diagnosis not present

## 2022-10-17 DIAGNOSIS — I25110 Atherosclerotic heart disease of native coronary artery with unstable angina pectoris: Secondary | ICD-10-CM | POA: Diagnosis not present

## 2022-10-17 DIAGNOSIS — I5032 Chronic diastolic (congestive) heart failure: Secondary | ICD-10-CM | POA: Diagnosis not present

## 2022-10-17 DIAGNOSIS — I130 Hypertensive heart and chronic kidney disease with heart failure and stage 1 through stage 4 chronic kidney disease, or unspecified chronic kidney disease: Secondary | ICD-10-CM | POA: Diagnosis not present

## 2022-10-18 DIAGNOSIS — N390 Urinary tract infection, site not specified: Secondary | ICD-10-CM | POA: Diagnosis not present

## 2022-10-18 DIAGNOSIS — F17210 Nicotine dependence, cigarettes, uncomplicated: Secondary | ICD-10-CM | POA: Diagnosis not present

## 2022-10-19 DIAGNOSIS — I130 Hypertensive heart and chronic kidney disease with heart failure and stage 1 through stage 4 chronic kidney disease, or unspecified chronic kidney disease: Secondary | ICD-10-CM | POA: Diagnosis not present

## 2022-10-19 DIAGNOSIS — N183 Chronic kidney disease, stage 3 unspecified: Secondary | ICD-10-CM | POA: Diagnosis not present

## 2022-10-19 DIAGNOSIS — I25110 Atherosclerotic heart disease of native coronary artery with unstable angina pectoris: Secondary | ICD-10-CM | POA: Diagnosis not present

## 2022-10-19 DIAGNOSIS — I5032 Chronic diastolic (congestive) heart failure: Secondary | ICD-10-CM | POA: Diagnosis not present

## 2022-10-19 DIAGNOSIS — S8391XD Sprain of unspecified site of right knee, subsequent encounter: Secondary | ICD-10-CM | POA: Diagnosis not present

## 2022-10-19 DIAGNOSIS — F419 Anxiety disorder, unspecified: Secondary | ICD-10-CM | POA: Diagnosis not present

## 2022-10-21 DIAGNOSIS — E039 Hypothyroidism, unspecified: Secondary | ICD-10-CM | POA: Diagnosis not present

## 2022-10-21 DIAGNOSIS — N183 Chronic kidney disease, stage 3 unspecified: Secondary | ICD-10-CM | POA: Diagnosis not present

## 2022-10-21 DIAGNOSIS — I5032 Chronic diastolic (congestive) heart failure: Secondary | ICD-10-CM | POA: Diagnosis not present

## 2022-10-21 DIAGNOSIS — I700 Atherosclerosis of aorta: Secondary | ICD-10-CM | POA: Diagnosis not present

## 2022-10-21 DIAGNOSIS — Z7901 Long term (current) use of anticoagulants: Secondary | ICD-10-CM | POA: Diagnosis not present

## 2022-10-21 DIAGNOSIS — Z79899 Other long term (current) drug therapy: Secondary | ICD-10-CM | POA: Diagnosis not present

## 2022-10-21 DIAGNOSIS — I4892 Unspecified atrial flutter: Secondary | ICD-10-CM | POA: Diagnosis not present

## 2022-10-21 DIAGNOSIS — Z602 Problems related to living alone: Secondary | ICD-10-CM | POA: Diagnosis not present

## 2022-10-21 DIAGNOSIS — I130 Hypertensive heart and chronic kidney disease with heart failure and stage 1 through stage 4 chronic kidney disease, or unspecified chronic kidney disease: Secondary | ICD-10-CM | POA: Diagnosis not present

## 2022-10-21 DIAGNOSIS — D62 Acute posthemorrhagic anemia: Secondary | ICD-10-CM | POA: Diagnosis not present

## 2022-10-21 DIAGNOSIS — I6522 Occlusion and stenosis of left carotid artery: Secondary | ICD-10-CM | POA: Diagnosis not present

## 2022-10-21 DIAGNOSIS — F419 Anxiety disorder, unspecified: Secondary | ICD-10-CM | POA: Diagnosis not present

## 2022-10-21 DIAGNOSIS — I25110 Atherosclerotic heart disease of native coronary artery with unstable angina pectoris: Secondary | ICD-10-CM | POA: Diagnosis not present

## 2022-10-21 DIAGNOSIS — I495 Sick sinus syndrome: Secondary | ICD-10-CM | POA: Diagnosis not present

## 2022-10-21 DIAGNOSIS — Z9181 History of falling: Secondary | ICD-10-CM | POA: Diagnosis not present

## 2022-10-21 DIAGNOSIS — I4819 Other persistent atrial fibrillation: Secondary | ICD-10-CM | POA: Diagnosis not present

## 2022-10-21 DIAGNOSIS — S8391XD Sprain of unspecified site of right knee, subsequent encounter: Secondary | ICD-10-CM | POA: Diagnosis not present

## 2022-10-21 DIAGNOSIS — R001 Bradycardia, unspecified: Secondary | ICD-10-CM | POA: Diagnosis not present

## 2022-10-21 DIAGNOSIS — Z96643 Presence of artificial hip joint, bilateral: Secondary | ICD-10-CM | POA: Diagnosis not present

## 2022-10-21 DIAGNOSIS — Z950 Presence of cardiac pacemaker: Secondary | ICD-10-CM | POA: Diagnosis not present

## 2022-10-21 DIAGNOSIS — Z86718 Personal history of other venous thrombosis and embolism: Secondary | ICD-10-CM | POA: Diagnosis not present

## 2022-10-26 DIAGNOSIS — S8391XD Sprain of unspecified site of right knee, subsequent encounter: Secondary | ICD-10-CM | POA: Diagnosis not present

## 2022-10-26 DIAGNOSIS — F419 Anxiety disorder, unspecified: Secondary | ICD-10-CM | POA: Diagnosis not present

## 2022-10-26 DIAGNOSIS — N183 Chronic kidney disease, stage 3 unspecified: Secondary | ICD-10-CM | POA: Diagnosis not present

## 2022-10-26 DIAGNOSIS — I5032 Chronic diastolic (congestive) heart failure: Secondary | ICD-10-CM | POA: Diagnosis not present

## 2022-10-26 DIAGNOSIS — I130 Hypertensive heart and chronic kidney disease with heart failure and stage 1 through stage 4 chronic kidney disease, or unspecified chronic kidney disease: Secondary | ICD-10-CM | POA: Diagnosis not present

## 2022-10-26 DIAGNOSIS — I25110 Atherosclerotic heart disease of native coronary artery with unstable angina pectoris: Secondary | ICD-10-CM | POA: Diagnosis not present

## 2022-10-28 DIAGNOSIS — F419 Anxiety disorder, unspecified: Secondary | ICD-10-CM | POA: Diagnosis not present

## 2022-10-28 DIAGNOSIS — I130 Hypertensive heart and chronic kidney disease with heart failure and stage 1 through stage 4 chronic kidney disease, or unspecified chronic kidney disease: Secondary | ICD-10-CM | POA: Diagnosis not present

## 2022-10-28 DIAGNOSIS — I5032 Chronic diastolic (congestive) heart failure: Secondary | ICD-10-CM | POA: Diagnosis not present

## 2022-10-28 DIAGNOSIS — I25110 Atherosclerotic heart disease of native coronary artery with unstable angina pectoris: Secondary | ICD-10-CM | POA: Diagnosis not present

## 2022-10-28 DIAGNOSIS — N183 Chronic kidney disease, stage 3 unspecified: Secondary | ICD-10-CM | POA: Diagnosis not present

## 2022-10-28 DIAGNOSIS — S8391XD Sprain of unspecified site of right knee, subsequent encounter: Secondary | ICD-10-CM | POA: Diagnosis not present

## 2022-11-02 DIAGNOSIS — F419 Anxiety disorder, unspecified: Secondary | ICD-10-CM | POA: Diagnosis not present

## 2022-11-02 DIAGNOSIS — I25110 Atherosclerotic heart disease of native coronary artery with unstable angina pectoris: Secondary | ICD-10-CM | POA: Diagnosis not present

## 2022-11-02 DIAGNOSIS — N183 Chronic kidney disease, stage 3 unspecified: Secondary | ICD-10-CM | POA: Diagnosis not present

## 2022-11-02 DIAGNOSIS — I5032 Chronic diastolic (congestive) heart failure: Secondary | ICD-10-CM | POA: Diagnosis not present

## 2022-11-02 DIAGNOSIS — I130 Hypertensive heart and chronic kidney disease with heart failure and stage 1 through stage 4 chronic kidney disease, or unspecified chronic kidney disease: Secondary | ICD-10-CM | POA: Diagnosis not present

## 2022-11-02 DIAGNOSIS — S8391XD Sprain of unspecified site of right knee, subsequent encounter: Secondary | ICD-10-CM | POA: Diagnosis not present

## 2022-11-03 DIAGNOSIS — R300 Dysuria: Secondary | ICD-10-CM | POA: Diagnosis not present

## 2022-11-04 DIAGNOSIS — I5032 Chronic diastolic (congestive) heart failure: Secondary | ICD-10-CM | POA: Diagnosis not present

## 2022-11-04 DIAGNOSIS — I130 Hypertensive heart and chronic kidney disease with heart failure and stage 1 through stage 4 chronic kidney disease, or unspecified chronic kidney disease: Secondary | ICD-10-CM | POA: Diagnosis not present

## 2022-11-04 DIAGNOSIS — I25110 Atherosclerotic heart disease of native coronary artery with unstable angina pectoris: Secondary | ICD-10-CM | POA: Diagnosis not present

## 2022-11-04 DIAGNOSIS — S8391XD Sprain of unspecified site of right knee, subsequent encounter: Secondary | ICD-10-CM | POA: Diagnosis not present

## 2022-11-04 DIAGNOSIS — N183 Chronic kidney disease, stage 3 unspecified: Secondary | ICD-10-CM | POA: Diagnosis not present

## 2022-11-04 DIAGNOSIS — F419 Anxiety disorder, unspecified: Secondary | ICD-10-CM | POA: Diagnosis not present

## 2022-11-07 DIAGNOSIS — I130 Hypertensive heart and chronic kidney disease with heart failure and stage 1 through stage 4 chronic kidney disease, or unspecified chronic kidney disease: Secondary | ICD-10-CM | POA: Diagnosis not present

## 2022-11-07 DIAGNOSIS — S8391XD Sprain of unspecified site of right knee, subsequent encounter: Secondary | ICD-10-CM | POA: Diagnosis not present

## 2022-11-07 DIAGNOSIS — F419 Anxiety disorder, unspecified: Secondary | ICD-10-CM | POA: Diagnosis not present

## 2022-11-07 DIAGNOSIS — N183 Chronic kidney disease, stage 3 unspecified: Secondary | ICD-10-CM | POA: Diagnosis not present

## 2022-11-07 DIAGNOSIS — I5032 Chronic diastolic (congestive) heart failure: Secondary | ICD-10-CM | POA: Diagnosis not present

## 2022-11-07 DIAGNOSIS — I25110 Atherosclerotic heart disease of native coronary artery with unstable angina pectoris: Secondary | ICD-10-CM | POA: Diagnosis not present

## 2022-11-10 ENCOUNTER — Ambulatory Visit (INDEPENDENT_AMBULATORY_CARE_PROVIDER_SITE_OTHER): Payer: Medicare Other

## 2022-11-10 DIAGNOSIS — I495 Sick sinus syndrome: Secondary | ICD-10-CM | POA: Diagnosis not present

## 2022-11-10 LAB — CUP PACEART REMOTE DEVICE CHECK
Battery Remaining Longevity: 108 mo
Battery Voltage: 3.01 V
Brady Statistic AP VP Percent: 0.16 %
Brady Statistic AP VS Percent: 65.43 %
Brady Statistic AS VP Percent: 0.04 %
Brady Statistic AS VS Percent: 34.37 %
Brady Statistic RA Percent Paced: 65.22 %
Brady Statistic RV Percent Paced: 0.59 %
Date Time Interrogation Session: 20240110203409
Implantable Lead Connection Status: 753985
Implantable Lead Connection Status: 753985
Implantable Lead Implant Date: 20100309
Implantable Lead Implant Date: 20100309
Implantable Lead Location: 753859
Implantable Lead Location: 753860
Implantable Lead Model: 5076
Implantable Lead Model: 5076
Implantable Pulse Generator Implant Date: 20190327
Lead Channel Impedance Value: 304 Ohm
Lead Channel Impedance Value: 380 Ohm
Lead Channel Impedance Value: 513 Ohm
Lead Channel Impedance Value: 551 Ohm
Lead Channel Pacing Threshold Amplitude: 0.5 V
Lead Channel Pacing Threshold Amplitude: 0.875 V
Lead Channel Pacing Threshold Pulse Width: 0.4 ms
Lead Channel Pacing Threshold Pulse Width: 0.4 ms
Lead Channel Sensing Intrinsic Amplitude: 28.625 mV
Lead Channel Sensing Intrinsic Amplitude: 28.625 mV
Lead Channel Sensing Intrinsic Amplitude: 3.375 mV
Lead Channel Sensing Intrinsic Amplitude: 3.375 mV
Lead Channel Setting Pacing Amplitude: 2 V
Lead Channel Setting Pacing Amplitude: 2.5 V
Lead Channel Setting Pacing Pulse Width: 0.4 ms
Lead Channel Setting Sensing Sensitivity: 1.2 mV
Zone Setting Status: 755011
Zone Setting Status: 755011

## 2022-11-11 ENCOUNTER — Emergency Department (HOSPITAL_COMMUNITY): Payer: Medicare Other

## 2022-11-11 ENCOUNTER — Inpatient Hospital Stay (HOSPITAL_COMMUNITY)
Admission: EM | Admit: 2022-11-11 | Discharge: 2022-11-17 | DRG: 291 | Disposition: A | Discharge status: Home Health | Payer: Medicare Other | Attending: Family Medicine | Admitting: Family Medicine

## 2022-11-11 DIAGNOSIS — D509 Iron deficiency anemia, unspecified: Secondary | ICD-10-CM | POA: Insufficient documentation

## 2022-11-11 DIAGNOSIS — I509 Heart failure, unspecified: Secondary | ICD-10-CM

## 2022-11-11 DIAGNOSIS — E039 Hypothyroidism, unspecified: Secondary | ICD-10-CM | POA: Diagnosis not present

## 2022-11-11 DIAGNOSIS — Z743 Need for continuous supervision: Secondary | ICD-10-CM | POA: Diagnosis not present

## 2022-11-11 DIAGNOSIS — Z7901 Long term (current) use of anticoagulants: Secondary | ICD-10-CM | POA: Diagnosis not present

## 2022-11-11 DIAGNOSIS — N281 Cyst of kidney, acquired: Secondary | ICD-10-CM | POA: Diagnosis not present

## 2022-11-11 DIAGNOSIS — M25519 Pain in unspecified shoulder: Secondary | ICD-10-CM | POA: Insufficient documentation

## 2022-11-11 DIAGNOSIS — I480 Paroxysmal atrial fibrillation: Secondary | ICD-10-CM

## 2022-11-11 DIAGNOSIS — I252 Old myocardial infarction: Secondary | ICD-10-CM

## 2022-11-11 DIAGNOSIS — J9601 Acute respiratory failure with hypoxia: Secondary | ICD-10-CM

## 2022-11-11 DIAGNOSIS — R112 Nausea with vomiting, unspecified: Secondary | ICD-10-CM | POA: Diagnosis not present

## 2022-11-11 DIAGNOSIS — Z951 Presence of aortocoronary bypass graft: Secondary | ICD-10-CM

## 2022-11-11 DIAGNOSIS — I10 Essential (primary) hypertension: Secondary | ICD-10-CM

## 2022-11-11 DIAGNOSIS — I130 Hypertensive heart and chronic kidney disease with heart failure and stage 1 through stage 4 chronic kidney disease, or unspecified chronic kidney disease: Secondary | ICD-10-CM | POA: Diagnosis present

## 2022-11-11 DIAGNOSIS — N1832 Chronic kidney disease, stage 3b: Secondary | ICD-10-CM

## 2022-11-11 DIAGNOSIS — N390 Urinary tract infection, site not specified: Secondary | ICD-10-CM | POA: Diagnosis not present

## 2022-11-11 DIAGNOSIS — I110 Hypertensive heart disease with heart failure: Secondary | ICD-10-CM | POA: Diagnosis not present

## 2022-11-11 DIAGNOSIS — Z1152 Encounter for screening for COVID-19: Secondary | ICD-10-CM

## 2022-11-11 DIAGNOSIS — B964 Proteus (mirabilis) (morganii) as the cause of diseases classified elsewhere: Secondary | ICD-10-CM | POA: Diagnosis present

## 2022-11-11 DIAGNOSIS — I5043 Acute on chronic combined systolic (congestive) and diastolic (congestive) heart failure: Secondary | ICD-10-CM | POA: Diagnosis present

## 2022-11-11 DIAGNOSIS — Z79890 Hormone replacement therapy (postmenopausal): Secondary | ICD-10-CM

## 2022-11-11 DIAGNOSIS — I272 Pulmonary hypertension, unspecified: Secondary | ICD-10-CM | POA: Diagnosis present

## 2022-11-11 DIAGNOSIS — K219 Gastro-esophageal reflux disease without esophagitis: Secondary | ICD-10-CM

## 2022-11-11 DIAGNOSIS — R7989 Other specified abnormal findings of blood chemistry: Secondary | ICD-10-CM

## 2022-11-11 DIAGNOSIS — I350 Nonrheumatic aortic (valve) stenosis: Secondary | ICD-10-CM | POA: Diagnosis present

## 2022-11-11 DIAGNOSIS — Z66 Do not resuscitate: Secondary | ICD-10-CM | POA: Diagnosis present

## 2022-11-11 DIAGNOSIS — I2510 Atherosclerotic heart disease of native coronary artery without angina pectoris: Secondary | ICD-10-CM | POA: Diagnosis present

## 2022-11-11 DIAGNOSIS — I495 Sick sinus syndrome: Secondary | ICD-10-CM | POA: Diagnosis not present

## 2022-11-11 DIAGNOSIS — J811 Chronic pulmonary edema: Secondary | ICD-10-CM | POA: Diagnosis present

## 2022-11-11 DIAGNOSIS — Z7951 Long term (current) use of inhaled steroids: Secondary | ICD-10-CM

## 2022-11-11 DIAGNOSIS — Z955 Presence of coronary angioplasty implant and graft: Secondary | ICD-10-CM | POA: Diagnosis not present

## 2022-11-11 DIAGNOSIS — I5021 Acute systolic (congestive) heart failure: Secondary | ICD-10-CM | POA: Diagnosis not present

## 2022-11-11 DIAGNOSIS — J45909 Unspecified asthma, uncomplicated: Secondary | ICD-10-CM | POA: Diagnosis not present

## 2022-11-11 DIAGNOSIS — J810 Acute pulmonary edema: Secondary | ICD-10-CM | POA: Diagnosis not present

## 2022-11-11 DIAGNOSIS — J189 Pneumonia, unspecified organism: Secondary | ICD-10-CM | POA: Diagnosis present

## 2022-11-11 DIAGNOSIS — Z79899 Other long term (current) drug therapy: Secondary | ICD-10-CM

## 2022-11-11 DIAGNOSIS — R0902 Hypoxemia: Secondary | ICD-10-CM | POA: Diagnosis not present

## 2022-11-11 DIAGNOSIS — Z823 Family history of stroke: Secondary | ICD-10-CM

## 2022-11-11 DIAGNOSIS — R531 Weakness: Secondary | ICD-10-CM | POA: Diagnosis not present

## 2022-11-11 DIAGNOSIS — E7800 Pure hypercholesterolemia, unspecified: Secondary | ICD-10-CM | POA: Diagnosis present

## 2022-11-11 DIAGNOSIS — K439 Ventral hernia without obstruction or gangrene: Secondary | ICD-10-CM | POA: Diagnosis not present

## 2022-11-11 DIAGNOSIS — R55 Syncope and collapse: Secondary | ICD-10-CM | POA: Diagnosis not present

## 2022-11-11 DIAGNOSIS — R110 Nausea: Secondary | ICD-10-CM | POA: Diagnosis not present

## 2022-11-11 DIAGNOSIS — J90 Pleural effusion, not elsewhere classified: Secondary | ICD-10-CM | POA: Diagnosis not present

## 2022-11-11 LAB — BASIC METABOLIC PANEL WITH GFR
Anion gap: 10 (ref 5–15)
BUN: 25 mg/dL — ABNORMAL HIGH (ref 8–23)
CO2: 29 mmol/L (ref 22–32)
Calcium: 9.1 mg/dL (ref 8.9–10.3)
Chloride: 96 mmol/L — ABNORMAL LOW (ref 98–111)
Creatinine, Ser: 1.36 mg/dL — ABNORMAL HIGH (ref 0.44–1.00)
GFR, Estimated: 35 mL/min — ABNORMAL LOW (ref >60)
Glucose, Bld: 96 mg/dL (ref 70–99)
Potassium: 3.9 mmol/L (ref 3.5–5.1)
Sodium: 135 mmol/L (ref 135–145)

## 2022-11-11 LAB — CBC WITH DIFFERENTIAL/PLATELET
Abs Immature Granulocytes: 0.05 K/uL (ref 0.00–0.07)
Basophils Absolute: 0 K/uL (ref 0.0–0.1)
Basophils Relative: 0 %
Eosinophils Absolute: 0.2 K/uL (ref 0.0–0.5)
Eosinophils Relative: 2 %
HCT: 37.9 % (ref 36.0–46.0)
Hemoglobin: 11.2 g/dL — ABNORMAL LOW (ref 12.0–15.0)
Immature Granulocytes: 1 %
Lymphocytes Relative: 14 %
Lymphs Abs: 1.4 K/uL (ref 0.7–4.0)
MCH: 25.2 pg — ABNORMAL LOW (ref 26.0–34.0)
MCHC: 29.6 g/dL — ABNORMAL LOW (ref 30.0–36.0)
MCV: 85.2 fL (ref 80.0–100.0)
Monocytes Absolute: 0.8 K/uL (ref 0.1–1.0)
Monocytes Relative: 8 %
Neutro Abs: 7.1 K/uL (ref 1.7–7.7)
Neutrophils Relative %: 75 %
Platelets: 237 K/uL (ref 150–400)
RBC: 4.45 MIL/uL (ref 3.87–5.11)
RDW: 16.5 % — ABNORMAL HIGH (ref 11.5–15.5)
WBC: 9.5 K/uL (ref 4.0–10.5)
nRBC: 0 % (ref 0.0–0.2)

## 2022-11-11 LAB — URINALYSIS, ROUTINE W REFLEX MICROSCOPIC
Bacteria, UA: NONE SEEN
Bilirubin Urine: NEGATIVE
Glucose, UA: NEGATIVE mg/dL
Ketones, ur: 20 mg/dL — AB
Leukocytes,Ua: NEGATIVE
Nitrite: NEGATIVE
Protein, ur: 30 mg/dL — AB
Specific Gravity, Urine: 1.019 (ref 1.005–1.030)
pH: 6 (ref 5.0–8.0)

## 2022-11-11 LAB — HEPATIC FUNCTION PANEL
ALT: 11 U/L (ref 0–44)
AST: 20 U/L (ref 15–41)
Albumin: 3.2 g/dL — ABNORMAL LOW (ref 3.5–5.0)
Alkaline Phosphatase: 88 U/L (ref 38–126)
Bilirubin, Direct: 0.2 mg/dL (ref 0.0–0.2)
Indirect Bilirubin: 0.5 mg/dL (ref 0.3–0.9)
Total Bilirubin: 0.7 mg/dL (ref 0.3–1.2)
Total Protein: 6.7 g/dL (ref 6.5–8.1)

## 2022-11-11 LAB — BLOOD GAS, ARTERIAL
Acid-Base Excess: 6.4 mmol/L — ABNORMAL HIGH (ref 0.0–2.0)
Bicarbonate: 31.3 mmol/L — ABNORMAL HIGH (ref 20.0–28.0)
Drawn by: 22766
FIO2: 21 %
O2 Saturation: 91.9 %
Patient temperature: 36.7
pCO2 arterial: 44 mmHg (ref 32–48)
pH, Arterial: 7.45 (ref 7.35–7.45)
pO2, Arterial: 58 mmHg — ABNORMAL LOW (ref 83–108)

## 2022-11-11 LAB — LIPASE, BLOOD: Lipase: 24 U/L (ref 11–51)

## 2022-11-11 LAB — RESP PANEL BY RT-PCR (RSV, FLU A&B, COVID)  RVPGX2
Influenza A by PCR: NEGATIVE
Influenza B by PCR: NEGATIVE
Resp Syncytial Virus by PCR: NEGATIVE
SARS Coronavirus 2 by RT PCR: NEGATIVE

## 2022-11-11 LAB — LACTIC ACID, PLASMA
Lactic Acid, Venous: 1.3 mmol/L (ref 0.5–1.9)
Lactic Acid, Venous: 1.1 mmol/L (ref 0.5–1.9)

## 2022-11-11 LAB — MAGNESIUM: Magnesium: 2.1 mg/dL (ref 1.7–2.4)

## 2022-11-11 LAB — BRAIN NATRIURETIC PEPTIDE: B Natriuretic Peptide: 590 pg/mL — ABNORMAL HIGH (ref 0.0–100.0)

## 2022-11-11 MED ORDER — FLUTICASONE FUROATE-VILANTEROL 200-25 MCG/ACT IN AEPB
1.0000 | INHALATION_SPRAY | Freq: Every day | RESPIRATORY_TRACT | Status: DC
  Administered 2022-11-12 – 2022-11-17 (×6): 1 via RESPIRATORY_TRACT
  Filled 2022-11-11: qty 28

## 2022-11-11 MED ORDER — FUROSEMIDE 10 MG/ML IJ SOLN: 40.0000 mg | Freq: Two times a day (BID) | INTRAMUSCULAR | Status: DC

## 2022-11-11 MED ORDER — FUROSEMIDE 10 MG/ML IJ SOLN
20.0000 mg | Freq: Once | INTRAMUSCULAR | Status: AC
  Administered 2022-11-11: 20 mg via INTRAVENOUS
  Filled 2022-11-11: qty 2

## 2022-11-11 MED ORDER — FUROSEMIDE 10 MG/ML IJ SOLN: 40.0000 mg | Freq: Once | INTRAMUSCULAR | Status: DC

## 2022-11-11 MED ORDER — FERROUS SULFATE 325 (65 FE) MG PO TABS
325.0000 mg | ORAL_TABLET | ORAL | Status: DC
  Administered 2022-11-11 – 2022-11-17 (×4): 325 mg via ORAL
  Filled 2022-11-11 (×4): qty 1

## 2022-11-11 MED ORDER — LACTATED RINGERS IV BOLUS
500.0000 mL | Freq: Once | INTRAVENOUS | Status: AC
  Administered 2022-11-11: 500 mL via INTRAVENOUS

## 2022-11-11 MED ORDER — LEVOTHYROXINE SODIUM 75 MCG PO TABS
75.0000 ug | ORAL_TABLET | Freq: Every day | ORAL | Status: DC
  Administered 2022-11-12 – 2022-11-17 (×6): 75 ug via ORAL
  Filled 2022-11-11 (×6): qty 1

## 2022-11-11 MED ORDER — ALBUTEROL SULFATE HFA 108 (90 BASE) MCG/ACT IN AERS: 2.0000 | INHALATION_SPRAY | Freq: Four times a day (QID) | RESPIRATORY_TRACT | Status: DC | PRN

## 2022-11-11 MED ORDER — LEVOTHYROXINE SODIUM 50 MCG PO TABS: 75.0000 ug | ORAL_TABLET | Freq: Every day | ORAL | Status: DC

## 2022-11-11 MED ORDER — METOPROLOL SUCCINATE ER 25 MG PO TB24
25.0000 mg | ORAL_TABLET | Freq: Every day | ORAL | Status: DC
  Administered 2022-11-11 – 2022-11-17 (×7): 25 mg via ORAL
  Filled 2022-11-11 (×7): qty 1

## 2022-11-11 MED ORDER — PANTOPRAZOLE SODIUM 40 MG PO TBEC
40.0000 mg | DELAYED_RELEASE_TABLET | Freq: Every day | ORAL | Status: DC
  Administered 2022-11-12: 40 mg via ORAL
  Filled 2022-11-11: qty 1

## 2022-11-11 MED ORDER — HYDRALAZINE HCL 20 MG/ML IJ SOLN: 10.0000 mg | Freq: Four times a day (QID) | INTRAMUSCULAR | Status: DC | PRN

## 2022-11-11 MED ORDER — APIXABAN 2.5 MG PO TABS
2.5000 mg | ORAL_TABLET | Freq: Two times a day (BID) | ORAL | Status: DC
  Administered 2022-11-11 – 2022-11-17 (×12): 2.5 mg via ORAL
  Filled 2022-11-11 (×12): qty 1

## 2022-11-11 MED ORDER — UMECLIDINIUM BROMIDE 62.5 MCG/ACT IN AEPB
1.0000 | INHALATION_SPRAY | Freq: Every day | RESPIRATORY_TRACT | Status: DC
  Administered 2022-11-12 – 2022-11-17 (×6): 1 via RESPIRATORY_TRACT
  Filled 2022-11-11: qty 7

## 2022-11-11 MED ORDER — IOHEXOL 350 MG/ML SOLN
75.0000 mL | Freq: Once | INTRAVENOUS | Status: AC | PRN
  Administered 2022-11-11: 75 mL via INTRAVENOUS

## 2022-11-11 NOTE — ED Notes (Signed)
Hospitalist at bedside 

## 2022-11-11 NOTE — ED Provider Notes (Signed)
[facility] EMERGENCY DEPARTMENT Provider Note   CSN: [id number] Arrival date & time: 11/11/22  1138     History No chief complaint on file.   Amy [name] is a 89 y.o. female with history of A-fib, pacemaker, hypertension, UTI, CKD presents the emergency department today for evaluation of nausea, vomiting, and diarrhea.  Patient was on an unknown antibiotic previously for UTI.  She was at her primary care office and was tested her again.  It grew an unknown source and she was put on Keflex on Tuesday.  She tried Keflex on Tuesday and started having nausea and vomiting.  Her PCP recommended taking a Zofran and Maalox.  She reports that she had 1 on Wednesday but still had the nausea and she had to yesterday as well.  She started having some watery diarrhea on Tuesday as well but denies any frank red blood or melena.  She denies any chest pain or shortness of breath.  She denies any recent cough or cold symptoms.  She mentions that she has been more tired lately and having decreased p.o. intake.  She did have a syncopal episode on the toilet which is common for her.  No abdominal pain, fevers.  The patient has not been able to take her medications due to the nausea and vomiting the past few days, including her blood pressure medication and her Eliquis.  HPI     Home Medications Prior to Admission medications   Medication Sig Start Date End Date Taking? Authorizing Provider  acetaminophen (TYLENOL) 500 MG tablet Take 500 mg by mouth every 6 (six) hours as needed for moderate pain.   Yes [provider]  albuterol (PROVENTIL HFA;VENTOLIN HFA) 108 (90 Base) MCG/ACT inhaler Inhale 2 puffs into the lungs every 6 (six) hours as needed for wheezing or shortness of breath.   Yes [provider]  cefUROXime (CEFTIN) 250 MG tablet Take 250 mg by mouth 2 (two) times daily. 11/07/22  Yes [provider]  Cholecalciferol (VITAMIN D3 PO) Take 1 tablet by mouth daily.   Yes  [provider]  ELIQUIS 2.5 MG TABS tablet Take 1 tablet by mouth twice daily Patient taking differently: Take 2.5 mg by mouth 2 (two) times daily. 10/28/19  Yes Croitoru, Mihai, MD  levothyroxine (SYNTHROID) 75 MCG tablet Take 1 tablet by mouth daily. 11/24/21  Yes [provider]  metoprolol succinate (TOPROL-XL) 25 MG 24 hr tablet Take 1 tablet by mouth once daily Patient taking differently: Take 25 mg by mouth daily. 08/21/20  Yes Croitoru, Mihai, MD  omeprazole (PRILOSEC) 20 MG capsule Take 20 mg by mouth daily. 10/18/21  Yes [provider]  Donnal Debar 200-62.5-25 MCG/ACT AEPB Inhale 1 puff into the lungs daily. 10/28/21  Yes [provider]  furosemide (LASIX) 40 MG tablet Take 1 tablet (40 mg total) by mouth daily. Patient not taking: Reported on 11/11/2022 12/20/21   Croitoru, Dani Gobble, MD  gabapentin (NEURONTIN) 100 MG capsule Take 1 capsule (100 mg total) by mouth 2 (two) times daily. Patient not taking: Reported on 11/11/2022 05/19/20   Croitoru, Dani Gobble, MD  nitroGLYCERIN (NITROSTAT) 0.4 MG SL tablet Place 1 tablet (0.4 mg total) under the tongue every 5 (five) minutes x 3 doses as needed for chest pain. Patient not taking: Reported on 06/20/2022 04/17/14   Barrett, Evelene Croon, PA-C  REPATHA SURECLICK 009 MG/ML SOAJ Inject 140 mg as directed every 14 (fourteen) days. Patient not taking: Reported on 11/11/2022 07/15/21   Croitoru,  Mihai, MD      Allergies    Colestipol, Hydrocodone, Niacin and related, Statins, and Zetia [ezetimibe]    Review of Systems   Review of Systems  Constitutional:  Negative for chills and fever.  HENT:  Negative for ear pain and sore throat.   Eyes:  Negative for pain and visual disturbance.  Respiratory:  Negative for cough and shortness of breath.   Cardiovascular:  Negative for chest pain and palpitations.  Gastrointestinal:  Positive for diarrhea, nausea and vomiting. Negative for abdominal pain, blood in stool and  constipation.  Genitourinary:  Negative for dysuria, frequency, hematuria and urgency.  Skin:  Negative for color change and rash.  Neurological:  Negative for seizures and syncope.  All other systems reviewed and are negative.   Physical Exam Updated Vital Signs BP (!) 170/93   Pulse 62   Temp 97.7 F (36.5 C) (Oral)   Resp 15   SpO2 98%  Physical Exam Vitals and nursing note reviewed.  Constitutional:      General: She is not in acute distress.    Appearance: Normal appearance. She is not ill-appearing or toxic-appearing.  HENT:     Head: Normocephalic and atraumatic.     Mouth/Throat:     Mouth: Mucous membranes are moist.  Eyes:     General: No scleral icterus. Cardiovascular:     Rate and Rhythm: Normal rate and regular rhythm.  Pulmonary:     Effort: Pulmonary effort is normal. No respiratory distress.     Breath sounds: Normal breath sounds. No wheezing.     Comments: Lung sounds are clear.  No respiratory distress, accessory muscle use, nasal flaring, tripoding, or cyanosis present. Abdominal:     General: Bowel sounds are normal.     Palpations: Abdomen is soft.     Tenderness: There is no abdominal tenderness. There is no guarding or rebound.     Comments: Abdominal exam nontender to palpation.  Abdomen soft.  No guarding or rebound noted.  No CVA tenderness bilaterally.  Musculoskeletal:        General: No deformity.     Cervical back: Normal range of motion.     Right lower leg: No edema.     Left lower leg: No edema.  Skin:    General: Skin is warm and dry.  Neurological:     General: No focal deficit present.     Mental Status: She is alert and oriented to person, place, and time. Mental status is at baseline.     Motor: No weakness.     ED Results / Procedures / Treatments   Labs (all labs ordered are listed, but only abnormal results are displayed) Labs Reviewed  URINALYSIS, ROUTINE W REFLEX MICROSCOPIC - Abnormal; Notable for the following  components:      Result Value   Color, Urine AMBER (*)    Hgb urine dipstick SMALL (*)    Ketones, ur 20 (*)    Protein, ur 30 (*)    All other components within normal limits  CBC WITH DIFFERENTIAL/PLATELET - Abnormal; Notable for the following components:   Hemoglobin 11.2 (*)    MCH 25.2 (*)    MCHC 29.6 (*)    RDW 16.5 (*)    All other components within normal limits  BASIC METABOLIC PANEL - Abnormal; Notable for the following components:   Chloride 96 (*)    BUN 25 (*)    Creatinine, Ser 1.36 (*)    GFR, Estimated  35 (*)    All other components within normal limits  BLOOD GAS, ARTERIAL - Abnormal; Notable for the following components:   pO2, Arterial 58 (*)    Bicarbonate 31.3 (*)    Acid-Base Excess 6.4 (*)    All other components within normal limits  HEPATIC FUNCTION PANEL - Abnormal; Notable for the following components:   Albumin 3.2 (*)    All other components within normal limits  BRAIN NATRIURETIC PEPTIDE - Abnormal; Notable for the following components:   B Natriuretic Peptide 590.0 (*)    All other components within normal limits  RESP PANEL BY RT-PCR (RSV, FLU A&B, COVID)  RVPGX2  CULTURE, BLOOD (ROUTINE X 2)  CULTURE, BLOOD (ROUTINE X 2)  URINE CULTURE  GASTROINTESTINAL PANEL BY PCR, STOOL (REPLACES STOOL CULTURE)  C DIFFICILE QUICK SCREEN W PCR REFLEX    MAGNESIUM  LIPASE, BLOOD  LACTIC ACID, PLASMA  LACTIC ACID, PLASMA    EKG EKG Interpretation  Date/Time:  Friday November 11 2022 12:44:12 EST Ventricular Rate:  78 PR Interval:  238 QRS Duration: 88 QT Interval:  411 QTC Calculation: 469 R Axis:   -72 Text Interpretation: Unknown rhythm, irregular rate Prolonged PR interval Left anterior fascicular block Anterior infarct, old Confirmed by Noemi Chapel [phone]) on 11/11/2022 4:19:50 PM  Radiology CT Angio Chest PE W and/or Wo Contrast  Result Date: 11/11/2022 CLINICAL DATA:  Nausea and vomiting, history of UTI, hypertension EXAM: CT ANGIOGRAPHY  CHEST CT ABDOMEN AND PELVIS WITH CONTRAST TECHNIQUE: Multidetector CT imaging of the chest was performed using the standard protocol during bolus administration of intravenous contrast. Multiplanar CT image reconstructions and MIPs were obtained to evaluate the vascular anatomy. Multidetector CT imaging of the abdomen and pelvis was performed using the standard protocol during bolus administration of intravenous contrast. RADIATION DOSE REDUCTION: This exam was performed according to the departmental dose-optimization program which includes automated exposure control, adjustment of the mA and/or kV according to patient size and/or use of iterative reconstruction technique. CONTRAST:  69m OMNIPAQUE IOHEXOL 350 MG/ML SOLN COMPARISON:  09/20/2022 FINDINGS: CTA CHEST FINDINGS Cardiovascular: This is a technically adequate evaluation of the pulmonary vasculature. No filling defects or pulmonary emboli. Mild cardiomegaly without pericardial effusion. Reflux of contrast into the hepatic veins suggests underlying cardiac dysfunction. No evidence of thoracic aortic aneurysm or dissection. Atherosclerosis of the aorta and native coronary vasculature unchanged. Prior CABG. Mediastinum/Nodes: No enlarged mediastinal, hilar, or axillary lymph nodes. Thyroid gland, trachea, and esophagus demonstrate no significant findings. Lungs/Pleura: Trace bilateral pleural effusions, left greater than right. There is interlobular septal thickening, with patchy areas of ground-glass airspace disease in the upper lung zones, favor fluid overload and developing edema. No pneumothorax. There is bilateral bronchial wall thickening greatest in the left lower lobe, with partial opacification likely reflecting mucoid impaction or aspiration. Musculoskeletal: No acute or destructive bony lesions. Severe bilateral shoulder osteoarthritis. Reconstructed images demonstrate no additional findings. Review of the MIP images confirms the above findings. CT  ABDOMEN and PELVIS FINDINGS Hepatobiliary: No focal liver abnormality is seen. No gallstones, gallbladder wall thickening, or biliary dilatation. Pancreas: Unremarkable. No pancreatic ductal dilatation or surrounding inflammatory changes. Spleen: Normal in size without focal abnormality. Adrenals/Urinary Tract: Numerous bilateral renal cortical cysts are unchanged, and do not require imaging follow-up. No urinary tract calculi or obstruction. The adrenals are unremarkable. The bladder is decompressed, limiting its evaluation. Stomach/Bowel: No bowel obstruction or ileus. Diverticulosis of the sigmoid colon without diverticulitis. Normal retrocecal appendix. No bowel wall thickening  or inflammatory change. Vascular/Lymphatic: Aortic atherosclerosis. No enlarged abdominal or pelvic lymph nodes. Reproductive: Uterus and bilateral adnexa are unremarkable. Other: No free fluid or free intraperitoneal gas. Stable fat containing umbilical and supraumbilical midline ventral hernias. No bowel herniation. Musculoskeletal: Bilateral hip arthroplasties. No acute or destructive bony abnormality. Reconstructed images demonstrate no additional findings. Review of the MIP images confirms the above findings. IMPRESSION: Chest: 1. No evidence of pulmonary embolus. 2. Cardiomegaly, with reflux of contrast into the hepatic veins suggesting underlying cardiac dysfunction. 3. Trace bilateral pleural effusions, with interlobular septal thickening and scattered ground-glass airspace disease consistent with fluid overload and developing edema. 4. Bilateral bronchial wall thickening, greatest in the left lower lobe with partial airway opacification. This could reflect mucoid impaction or aspiration. 5. Aortic Atherosclerosis (ICD10-I70.0). Coronary artery atherosclerosis. Abdomen/pelvis: 1. No acute intra-abdominal or intrapelvic process. 2. Distal colonic diverticulosis without diverticulitis. 3. Stable midline fat containing ventral and  umbilical hernias. 4.  Aortic Atherosclerosis (ICD10-I70.0). Electronically Signed   By: Randa Ngo M.D.   On: 11/11/2022 18:36   CT ABDOMEN PELVIS W CONTRAST  Result Date: 11/11/2022 CLINICAL DATA:  Nausea and vomiting, history of UTI, hypertension EXAM: CT ANGIOGRAPHY CHEST CT ABDOMEN AND PELVIS WITH CONTRAST TECHNIQUE: Multidetector CT imaging of the chest was performed using the standard protocol during bolus administration of intravenous contrast. Multiplanar CT image reconstructions and MIPs were obtained to evaluate the vascular anatomy. Multidetector CT imaging of the abdomen and pelvis was performed using the standard protocol during bolus administration of intravenous contrast. RADIATION DOSE REDUCTION: This exam was performed according to the departmental dose-optimization program which includes automated exposure control, adjustment of the mA and/or kV according to patient size and/or use of iterative reconstruction technique. CONTRAST:  47m OMNIPAQUE IOHEXOL 350 MG/ML SOLN COMPARISON:  09/20/2022 FINDINGS: CTA CHEST FINDINGS Cardiovascular: This is a technically adequate evaluation of the pulmonary vasculature. No filling defects or pulmonary emboli. Mild cardiomegaly without pericardial effusion. Reflux of contrast into the hepatic veins suggests underlying cardiac dysfunction. No evidence of thoracic aortic aneurysm or dissection. Atherosclerosis of the aorta and native coronary vasculature unchanged. Prior CABG. Mediastinum/Nodes: No enlarged mediastinal, hilar, or axillary lymph nodes. Thyroid gland, trachea, and esophagus demonstrate no significant findings. Lungs/Pleura: Trace bilateral pleural effusions, left greater than right. There is interlobular septal thickening, with patchy areas of ground-glass airspace disease in the upper lung zones, favor fluid overload and developing edema. No pneumothorax. There is bilateral bronchial wall thickening greatest in the left lower lobe, with  partial opacification likely reflecting mucoid impaction or aspiration. Musculoskeletal: No acute or destructive bony lesions. Severe bilateral shoulder osteoarthritis. Reconstructed images demonstrate no additional findings. Review of the MIP images confirms the above findings. CT ABDOMEN and PELVIS FINDINGS Hepatobiliary: No focal liver abnormality is seen. No gallstones, gallbladder wall thickening, or biliary dilatation. Pancreas: Unremarkable. No pancreatic ductal dilatation or surrounding inflammatory changes. Spleen: Normal in size without focal abnormality. Adrenals/Urinary Tract: Numerous bilateral renal cortical cysts are unchanged, and do not require imaging follow-up. No urinary tract calculi or obstruction. The adrenals are unremarkable. The bladder is decompressed, limiting its evaluation. Stomach/Bowel: No bowel obstruction or ileus. Diverticulosis of the sigmoid colon without diverticulitis. Normal retrocecal appendix. No bowel wall thickening or inflammatory change. Vascular/Lymphatic: Aortic atherosclerosis. No enlarged abdominal or pelvic lymph nodes. Reproductive: Uterus and bilateral adnexa are unremarkable. Other: No free fluid or free intraperitoneal gas. Stable fat containing umbilical and supraumbilical midline ventral hernias. No bowel herniation. Musculoskeletal: Bilateral hip arthroplasties. No acute or  destructive bony abnormality. Reconstructed images demonstrate no additional findings. Review of the MIP images confirms the above findings. IMPRESSION: Chest: 1. No evidence of pulmonary embolus. 2. Cardiomegaly, with reflux of contrast into the hepatic veins suggesting underlying cardiac dysfunction. 3. Trace bilateral pleural effusions, with interlobular septal thickening and scattered ground-glass airspace disease consistent with fluid overload and developing edema. 4. Bilateral bronchial wall thickening, greatest in the left lower lobe with partial airway opacification. This could  reflect mucoid impaction or aspiration. 5. Aortic Atherosclerosis (ICD10-I70.0). Coronary artery atherosclerosis. Abdomen/pelvis: 1. No acute intra-abdominal or intrapelvic process. 2. Distal colonic diverticulosis without diverticulitis. 3. Stable midline fat containing ventral and umbilical hernias. 4.  Aortic Atherosclerosis (ICD10-I70.0). Electronically Signed   By: Randa Ngo M.D.   On: 11/11/2022 18:36   DG Chest 2 View  Result Date: 11/11/2022 CLINICAL DATA:  Infection.  On antibiotics.  Shortness of breath EXAM: CHEST - 2 VIEW COMPARISON:  X-ray 11/24/2020.  CT 09/20/2022 FINDINGS: Left chest pacemaker. Status post median sternotomy. Calcified aorta. Increasing left lung base opacity with tiny left effusion. Diffuse interstitial changes are seen with apical pleural thickening. Stable thickening along the minor fissure as well. Degenerative changes of the spine. Advanced degenerative changes of the shoulders. IMPRESSION: Postop chest with increasing opacity and tiny effusion at the left lung base. Possible infiltrate. Recommend follow-up. Electronically Signed   By: Jill Side M.D.   On: 11/11/2022 14:24    Procedures Procedures   Medications Ordered in ED Medications  metoprolol succinate (TOPROL-XL) 24 hr tablet 25 mg (25 mg Oral Given 11/11/22 1349)  furosemide (LASIX) injection 20 mg (has no administration in time range)  lactated ringers bolus 500 mL (0 mLs Intravenous Stopped 11/11/22 1736)  iohexol (OMNIPAQUE) 350 MG/ML injection 75 mL (75 mLs Intravenous Contrast Given 11/11/22 1757)    ED Course/ Medical Decision Making/ A&P                            Medical Decision Making Amount and/or Complexity of Data Reviewed Labs: ordered. Radiology: ordered.  Risk Prescription drug management. Decision regarding hospitalization.   89 y/o F presents to the ER for evaluation of nausea, vomiting, diarrhea, fatigue, decreased p.o. intake, but was found to be hypoxic in no  respiratory distress.  Differential diagnosis includes but is not limited to CHF, ACS, PE, pneumonia, pneumothorax, C. difficile, pyelonephritis, UTI, sepsis, emphysema, COPD exacerbation.  Vital signs show blood pressure at 164/79, normal pulse rate, decrease saturation 80% on arrival.  On my evaluation, patient is not in any acute distress.  Well-appearing.  Unfortunately, I did not evaluate the patient until after she had been roomed for almost an hour. The patient was hypoxic without oxygen for over 45 minutes. The patient was placed on [place] shortly before I signed up for the patient. Thankfully, the patient is well appearing and is in no acute distress. No respiratory decompensation during her stay.  I independently reviewed and interpreted the patient's labs.  CBC shows no acute cytosis.  Anemia slightly decreased from baseline.  Anything within normal limits.  BMP shows mildly decreased chloride at 96.  Creatinine at baseline at 1.36.  Urinalysis shows amber urine with small amount of hemoglobin 20 ketones and 30 protein although no other signs of infection.  Will add on urine culture.  Lipase is within normal limits.  Hepatic function panel shows mildly decreased albumin at 3.2.  Normal lactic acid.  Negative for COVID,  flu, RSV.  I trialed weaning the patient off of oxygen however she went into the 80s after some time.  She was returned back to her 2 L of nasal cannula.  Given the patient's hypoxia, I did order an ABG which does confirm hypoxia with a PaO2 of 58.  I ordered a LR bolus of 500.  Patient last had an echo in February 2023 that showed an EF of 60 to 65%.  Safer fluids at this time.  Given that she has reassuring lung sounds, unsure why the patient is hypoxic.  Given that she has A-fib and that she was not taking her Eliquis due to her nausea and vomiting, concern for PE.  Will order CT imaging of the chest and abdomen to see if there is any underlying cause of her nausea and vomiting  although is likely due to her antibiotics.  CT imaging shows  IMPRESSION: Chest: 1. No evidence of pulmonary embolus. 2. Cardiomegaly, with reflux of contrast into the hepatic veins suggesting underlying cardiac dysfunction. 3. Trace bilateral pleural effusions, with interlobular septal thickening and scattered ground-glass airspace disease consistent with fluid overload and developing edema. 4. Bilateral bronchial wall thickening, greatest in the left lower lobe with partial airway opacification. This could reflect mucoid impaction or aspiration. 5. Aortic Atherosclerosis (ICD10-I70.0). Coronary artery atherosclerosis. Abdomen/pelvis: 1. No acute intra-abdominal or intrapelvic process. 2. Distal colonic diverticulosis without diverticulitis. 3. Stable midline fat containing ventral and umbilical hernias. 4.  Aortic Atherosclerosis.  Given the pulmonary edema seen on CT imaging, I did order a BNP on the patient which is elevated.  Patient is likely hypoxic due to worsening heart failure however she appears to be in no acute distress.  She still does not complain of any chest pain or shortness of breath.  I likely think the nausea and vomiting is from her Keflex that she just has not had any emesis here.  CT does not show any reason for the nausea and vomiting.  Stool culture still to be collected for possible C. difficile of the patient's recent antibiotics.  Given her oxygen requirement, patient will need to be admitted to the hospital for further evaluation.  Admit to Dr. Josephine Cables.  He asked that I give the patient 20 mg of Lasix as she does not appear to be in volume overload and that she has been having decreased p.o. intake with nausea and vomiting, and concern for dehydration.  I discussed this case with my attending physician who cosigned this note including patient's presenting symptoms, physical exam, and planned diagnostics and interventions. Attending physician stated agreement with plan or made  changes to plan which were implemented.   Final Clinical Impression(s) / ED Diagnoses Final diagnoses:  Hypoxia  Acute on chronic congestive heart failure, unspecified heart failure type (HCC)  Nausea vomiting and diarrhea    Rx / DC Orders ED Discharge Orders     None         Sherrell Puller, PA-C 11/12/22 0056    Noemi Chapel, MD 11/12/22 1427

## 2022-11-11 NOTE — ED Notes (Signed)
Placed patient back on 2 liters of oxygen due to saturation dropping again. Was down to 71 and is up now to 93

## 2022-11-11 NOTE — ED Triage Notes (Signed)
Pt arrived via rescue squad for UTI/kidney infection in which she is currently on antibiotics on--since starting antibiotics, pt has experienced vomiting and diarrhea for over a day, PCP requesting for pt to come here for further eval. Hx of high BP, but has not taken BP meds today

## 2022-11-11 NOTE — H&P (Incomplete)
History and Physical    Patient: Amy [name] YNW:[id number] DOB: [birth year] DOA: 11/11/2022 DOS: the patient was seen and examined on 11/12/2022 PCP: [facility]  Patient coming from: Home  Chief Complaint: No chief complaint on file.  HPI: Amy [name] is a 89 y.o. female with medical history significant of asthma, paroxysmal atrial fibrillation on Eliquis, GERD, CAD s/p CABG x 3, hypertension, hypothyroidism who presents to the emergency department via EMS from home due to complaints of 1 day onset of nausea and vomiting x 1 and diarrhea which started after starting antibiotics for UTI.  She complained of 2 episodes per day of watery diarrhea which has since resolved since yesterday.  She states her PCP requested for her to go to the ED for further evaluation and management.  ED Course:  In the emergency department, she was intermittently tachypneic, BP was 164/79, O2 sat was in the 80s on arrival to the ED, supplemental oxygen via [place] at 2 LPM was provided with improvement of O2 sat to 91-100%.  Other vital signs were within normal range.  Workup in the ED showed normocytic anemia, BMP was normal except for chloride of 96 and BUN/creatinine 85/1.36 (creatinine is within baseline range).  Urinalysis was unimpressive for UTI.  Lipase 24, lactic acid x 2 was negative, albumin 3.2, BNP 590.  Influenza A, B, SARS coronavirus 2, RSV was negative.  Blood culture showed no growth in less than 12 hours. CT angiography of chest showed no evidence of pulmonary embolus, but showed cardiomegaly and bilateral pleural effusions, with interlobular septal thickening and scattered ground-glass airspace disease consistent with fluid overload and developing edema. CT abdomen pelvis showed no acute intra-abdominal or intrapelvic process Chest x-ray showed Postop chest with increasing opacity and tiny effusion at the left lung base. Possible infiltrate IV hydration of 500 mL LR was given Toprol-XL 25  mg x 1 was given.  IV Lasix 20 mg x 1 was given.  Hospitalist was asked to admit patient for further evaluation and management.  Review of Systems: Review of systems as noted in the HPI. All other systems reviewed and are negative.   Past Medical History:  Diagnosis Date   Anxiety    Arthritis    Asthma    CAD in native artery    s/p CABG x 3; Last Myoview in 07/2009 - non-ischemic; Echo 03/2012  Aortic Sclerosis with normal EF.   CKD (chronic kidney disease), stage III (HCC)    DVT (deep vein thrombosis) in pregnancy    Esophageal perforation    9/13   Fall 07/21/2014   FALL WITH INJURY   Fracture of head of humerus    Fracture of hip ([place]) 07/21/2014   LEFT   GERD (gastroesophageal reflux disease)    Hypercholesteremia    Hypertension    Hypothyroidism    Loss of vision 05/22/2018   LEFT EYE   Mediastinitis    s/p drainage   Myocardial infarction [facility])    Inferior STEMI 07/2008 - PCI of prox RCA followed byu CABG x 3 in 9/'09   Pacemaker    PAF (paroxysmal atrial fibrillation) (HCC)    Paroxysmal atrial flutter ([place]) 11/26/2014   Pneumonia    Shortness of breath    SSS (sick sinus syndrome) ([place]) 11/26/2014   Status post hip hemiarthroplasty left hip by Dr Ronnie Derby 08/08/2012   Past Surgical History:  Procedure Laterality Date   CARDIOVERSION N/A 09/21/2018   Procedure: CARDIOVERSION;  Surgeon: Sanda Klein,  MD;  Location: [place];  Service: Cardiovascular;  Laterality: N/A;   CARPAL TUNNEL RELEASE  08/08/2012   Procedure: CARPAL TUNNEL RELEASE;  Surgeon: Schuyler Amor, MD;  Location: [place];  Service: Orthopedics;  Laterality: Left;   CORONARY ARTERY BYPASS GRAFT  2009   LIMA-LAD, SVG-RI, SVG-stented RCA   ESOPHAGOGASTRODUODENOSCOPY  08/10/2012   Procedure: ESOPHAGOGASTRODUODENOSCOPY (EGD);  Surgeon: Beryle Beams, MD;  Location: [facility] ENDOSCOPY;  Service: Endoscopy;  Laterality: N/A;   ESOPHAGOSCOPY  08/10/2012   Procedure: ESOPHAGOSCOPY;  Surgeon: [name], MD;  Location: [place];  Service: ENT;  Laterality: N/A;   GASTROSTOMY  08/16/2012   Procedure: GASTROSTOMY;  Surgeon: Zenovia Jarred, MD;  Location: [place];  Service: General;  Laterality: N/A;   HIP ARTHROPLASTY  08/08/2012   Procedure: ARTHROPLASTY BIPOLAR HIP;  Surgeon: Rudean Haskell, MD;  Location: [place];  Service: Orthopedics;  Laterality: Left;  [place]    JOINT REPLACEMENT     LEFT HEART CATHETERIZATION WITH CORONARY ANGIOGRAM N/A 04/16/2014   Procedure: LEFT HEART CATHETERIZATION WITH CORONARY ANGIOGRAM;  Surgeon: Wellington Hampshire, MD;  Location: [place] CATH LAB;  Service: Cardiovascular;  Laterality: N/A;   NM MYOCAR PERF WALL MOTION  08/14/2009   Normal   PACEMAKER INSERTION  01/06/2009   [employer]   PPM GENERATOR CHANGEOUT N/A 01/24/2018   Procedure: PPM GENERATOR CHANGEOUT;  Surgeon: Sanda Klein, MD;  Location: [place] CV LAB;  Service: Cardiovascular;  Laterality: N/A;   TOTAL HIP ARTHROPLASTY     WRIST FRACTURE SURGERY  07/2012   left intra articular  with carpel tunnel    Social History:  reports that she has never smoked. She has never used smokeless tobacco. She reports that she does not drink alcohol and does not use drugs.   Allergies  Allergen Reactions   Colestipol Other (See Comments)    Muscle aches   Hydrocodone Itching   Niacin And Related Other (See Comments)    Muscle pain   Statins Other (See Comments)    Muscle aches on all she has tried.  She recalls Lipitor, Crestor, and Livalo but thinks there were others   Zetia [Ezetimibe] Other (See Comments)    Muscle aches    Family History  Problem Relation Age of Onset   Stroke Father      Prior to Admission medications   Medication Sig Start Date End Date Taking? Authorizing Provider  acetaminophen (TYLENOL) 500 MG tablet Take 500 mg by mouth every 6 (six) hours as needed for moderate pain.   Yes [provider]  albuterol (PROVENTIL HFA;VENTOLIN HFA) 108 (90 Base) MCG/ACT inhaler  Inhale 2 puffs into the lungs every 6 (six) hours as needed for wheezing or shortness of breath.   Yes [provider]  cefUROXime (CEFTIN) 250 MG tablet Take 250 mg by mouth 2 (two) times daily. 11/07/22  Yes [provider]  Cholecalciferol (VITAMIN D3 PO) Take 1 tablet by mouth daily.   Yes [provider]  ELIQUIS 2.5 MG TABS tablet Take 1 tablet by mouth twice daily Patient taking differently: Take 2.5 mg by mouth 2 (two) times daily. 10/28/19  Yes Croitoru, Mihai, MD  levothyroxine (SYNTHROID) 75 MCG tablet Take 1 tablet by mouth daily. 11/24/21  Yes [provider]  metoprolol succinate (TOPROL-XL) 25 MG 24 hr tablet Take 1 tablet by mouth once daily Patient taking differently: Take 25 mg by mouth daily. 08/21/20  Yes Croitoru, Dani Gobble, MD  omeprazole (PRILOSEC) 20  MG capsule Take 20 mg by mouth daily. 10/18/21  Yes [provider]  Donnal Debar 200-62.5-25 MCG/ACT AEPB Inhale 1 puff into the lungs daily. 10/28/21  Yes [provider]  furosemide (LASIX) 40 MG tablet Take 1 tablet (40 mg total) by mouth daily. Patient not taking: Reported on 11/11/2022 12/20/21   Croitoru, Dani Gobble, MD  gabapentin (NEURONTIN) 100 MG capsule Take 1 capsule (100 mg total) by mouth 2 (two) times daily. Patient not taking: Reported on 11/11/2022 05/19/20   Croitoru, Dani Gobble, MD  nitroGLYCERIN (NITROSTAT) 0.4 MG SL tablet Place 1 tablet (0.4 mg total) under the tongue every 5 (five) minutes x 3 doses as needed for chest pain. Patient not taking: Reported on 06/20/2022 04/17/14   Barrett, Evelene Croon, PA-C  REPATHA SURECLICK 625 MG/ML SOAJ Inject 140 mg as directed every 14 (fourteen) days. Patient not taking: Reported on 11/11/2022 07/15/21   Croitoru, Dani Gobble, MD    Physical Exam: BP (!) 163/81   Pulse (!) 58   Temp 98.4 F (36.9 C) (Oral)   Resp 18   SpO2 94%   General: 89 y.o. year-old female well developed well nourished in no acute distress.  Alert and oriented  x3. HEENT: NCAT, EOMI Neck: Supple, trachea medial Cardiovascular: Regular rate and rhythm with no rubs or gallops.  No thyromegaly or JVD noted.  No lower extremity edema. 2/4 pulses in all 4 extremities. Respiratory: Clear to auscultation with no wheezes or rales. Good inspiratory effort. Abdomen: Soft, nontender nondistended with normal bowel sounds x4 quadrants. Muskuloskeletal: No cyanosis, clubbing or edema noted bilaterally Neuro: CN II-XII intact, strength 5/5 x 4, sensation, reflexes intact Skin: No ulcerative lesions noted or rashes Psychiatry: Judgement and insight appear normal. Mood is appropriate for condition and setting          Labs on Admission:  Basic Metabolic Panel: Recent Labs  Lab 11/11/22 1246  NA 135  K 3.9  CL 96*  CO2 29  GLUCOSE 96  BUN 25*  CREATININE 1.36*  CALCIUM 9.1  MG 2.1   Liver Function Tests: Recent Labs  Lab 11/11/22 1457  AST 20  ALT 11  ALKPHOS 88  BILITOT 0.7  PROT 6.7  ALBUMIN 3.2*   Recent Labs  Lab 11/11/22 1457  LIPASE 24   No results for input(s): "AMMONIA" in the last 168 hours. CBC: Recent Labs  Lab 11/11/22 1246  WBC 9.5  NEUTROABS 7.1  HGB 11.2*  HCT 37.9  MCV 85.2  PLT 237   Cardiac Enzymes: No results for input(s): "CKTOTAL", "CKMB", "CKMBINDEX", "TROPONINI" in the last 168 hours.  BNP (last 3 results) Recent Labs    12/16/21 1629 01/03/22 1626 11/11/22 1246  BNP 247.1* 140.8* 590.0*    ProBNP (last 3 results) No results for input(s): "PROBNP" in the last 8760 hours.  CBG: No results for input(s): "GLUCAP" in the last 168 hours.  Radiological Exams on Admission: CT Angio Chest PE W and/or Wo Contrast  Result Date: 11/11/2022 CLINICAL DATA:  Nausea and vomiting, history of UTI, hypertension EXAM: CT ANGIOGRAPHY CHEST CT ABDOMEN AND PELVIS WITH CONTRAST TECHNIQUE: Multidetector CT imaging of the chest was performed using the standard protocol during bolus administration of intravenous  contrast. Multiplanar CT image reconstructions and MIPs were obtained to evaluate the vascular anatomy. Multidetector CT imaging of the abdomen and pelvis was performed using the standard protocol during bolus administration of intravenous contrast. RADIATION DOSE REDUCTION: This exam was performed according to the departmental dose-optimization program which  includes automated exposure control, adjustment of the mA and/or kV according to patient size and/or use of iterative reconstruction technique. CONTRAST:  36m OMNIPAQUE IOHEXOL 350 MG/ML SOLN COMPARISON:  09/20/2022 FINDINGS: CTA CHEST FINDINGS Cardiovascular: This is a technically adequate evaluation of the pulmonary vasculature. No filling defects or pulmonary emboli. Mild cardiomegaly without pericardial effusion. Reflux of contrast into the hepatic veins suggests underlying cardiac dysfunction. No evidence of thoracic aortic aneurysm or dissection. Atherosclerosis of the aorta and native coronary vasculature unchanged. Prior CABG. Mediastinum/Nodes: No enlarged mediastinal, hilar, or axillary lymph nodes. Thyroid gland, trachea, and esophagus demonstrate no significant findings. Lungs/Pleura: Trace bilateral pleural effusions, left greater than right. There is interlobular septal thickening, with patchy areas of ground-glass airspace disease in the upper lung zones, favor fluid overload and developing edema. No pneumothorax. There is bilateral bronchial wall thickening greatest in the left lower lobe, with partial opacification likely reflecting mucoid impaction or aspiration. Musculoskeletal: No acute or destructive bony lesions. Severe bilateral shoulder osteoarthritis. Reconstructed images demonstrate no additional findings. Review of the MIP images confirms the above findings. CT ABDOMEN and PELVIS FINDINGS Hepatobiliary: No focal liver abnormality is seen. No gallstones, gallbladder wall thickening, or biliary dilatation. Pancreas: Unremarkable. No  pancreatic ductal dilatation or surrounding inflammatory changes. Spleen: Normal in size without focal abnormality. Adrenals/Urinary Tract: Numerous bilateral renal cortical cysts are unchanged, and do not require imaging follow-up. No urinary tract calculi or obstruction. The adrenals are unremarkable. The bladder is decompressed, limiting its evaluation. Stomach/Bowel: No bowel obstruction or ileus. Diverticulosis of the sigmoid colon without diverticulitis. Normal retrocecal appendix. No bowel wall thickening or inflammatory change. Vascular/Lymphatic: Aortic atherosclerosis. No enlarged abdominal or pelvic lymph nodes. Reproductive: Uterus and bilateral adnexa are unremarkable. Other: No free fluid or free intraperitoneal gas. Stable fat containing umbilical and supraumbilical midline ventral hernias. No bowel herniation. Musculoskeletal: Bilateral hip arthroplasties. No acute or destructive bony abnormality. Reconstructed images demonstrate no additional findings. Review of the MIP images confirms the above findings. IMPRESSION: Chest: 1. No evidence of pulmonary embolus. 2. Cardiomegaly, with reflux of contrast into the hepatic veins suggesting underlying cardiac dysfunction. 3. Trace bilateral pleural effusions, with interlobular septal thickening and scattered ground-glass airspace disease consistent with fluid overload and developing edema. 4. Bilateral bronchial wall thickening, greatest in the left lower lobe with partial airway opacification. This could reflect mucoid impaction or aspiration. 5. Aortic Atherosclerosis (ICD10-I70.0). Coronary artery atherosclerosis. Abdomen/pelvis: 1. No acute intra-abdominal or intrapelvic process. 2. Distal colonic diverticulosis without diverticulitis. 3. Stable midline fat containing ventral and umbilical hernias. 4.  Aortic Atherosclerosis (ICD10-I70.0). Electronically Signed   By: MRanda NgoM.D.   On: 11/11/2022 18:36   CT ABDOMEN PELVIS W CONTRAST  Result  Date: 11/11/2022 CLINICAL DATA:  Nausea and vomiting, history of UTI, hypertension EXAM: CT ANGIOGRAPHY CHEST CT ABDOMEN AND PELVIS WITH CONTRAST TECHNIQUE: Multidetector CT imaging of the chest was performed using the standard protocol during bolus administration of intravenous contrast. Multiplanar CT image reconstructions and MIPs were obtained to evaluate the vascular anatomy. Multidetector CT imaging of the abdomen and pelvis was performed using the standard protocol during bolus administration of intravenous contrast. RADIATION DOSE REDUCTION: This exam was performed according to the departmental dose-optimization program which includes automated exposure control, adjustment of the mA and/or kV according to patient size and/or use of iterative reconstruction technique. CONTRAST:  736mOMNIPAQUE IOHEXOL 350 MG/ML SOLN COMPARISON:  09/20/2022 FINDINGS: CTA CHEST FINDINGS Cardiovascular: This is a technically adequate evaluation of the pulmonary vasculature. No  filling defects or pulmonary emboli. Mild cardiomegaly without pericardial effusion. Reflux of contrast into the hepatic veins suggests underlying cardiac dysfunction. No evidence of thoracic aortic aneurysm or dissection. Atherosclerosis of the aorta and native coronary vasculature unchanged. Prior CABG. Mediastinum/Nodes: No enlarged mediastinal, hilar, or axillary lymph nodes. Thyroid gland, trachea, and esophagus demonstrate no significant findings. Lungs/Pleura: Trace bilateral pleural effusions, left greater than right. There is interlobular septal thickening, with patchy areas of ground-glass airspace disease in the upper lung zones, favor fluid overload and developing edema. No pneumothorax. There is bilateral bronchial wall thickening greatest in the left lower lobe, with partial opacification likely reflecting mucoid impaction or aspiration. Musculoskeletal: No acute or destructive bony lesions. Severe bilateral shoulder osteoarthritis.  Reconstructed images demonstrate no additional findings. Review of the MIP images confirms the above findings. CT ABDOMEN and PELVIS FINDINGS Hepatobiliary: No focal liver abnormality is seen. No gallstones, gallbladder wall thickening, or biliary dilatation. Pancreas: Unremarkable. No pancreatic ductal dilatation or surrounding inflammatory changes. Spleen: Normal in size without focal abnormality. Adrenals/Urinary Tract: Numerous bilateral renal cortical cysts are unchanged, and do not require imaging follow-up. No urinary tract calculi or obstruction. The adrenals are unremarkable. The bladder is decompressed, limiting its evaluation. Stomach/Bowel: No bowel obstruction or ileus. Diverticulosis of the sigmoid colon without diverticulitis. Normal retrocecal appendix. No bowel wall thickening or inflammatory change. Vascular/Lymphatic: Aortic atherosclerosis. No enlarged abdominal or pelvic lymph nodes. Reproductive: Uterus and bilateral adnexa are unremarkable. Other: No free fluid or free intraperitoneal gas. Stable fat containing umbilical and supraumbilical midline ventral hernias. No bowel herniation. Musculoskeletal: Bilateral hip arthroplasties. No acute or destructive bony abnormality. Reconstructed images demonstrate no additional findings. Review of the MIP images confirms the above findings. IMPRESSION: Chest: 1. No evidence of pulmonary embolus. 2. Cardiomegaly, with reflux of contrast into the hepatic veins suggesting underlying cardiac dysfunction. 3. Trace bilateral pleural effusions, with interlobular septal thickening and scattered ground-glass airspace disease consistent with fluid overload and developing edema. 4. Bilateral bronchial wall thickening, greatest in the left lower lobe with partial airway opacification. This could reflect mucoid impaction or aspiration. 5. Aortic Atherosclerosis (ICD10-I70.0). Coronary artery atherosclerosis. Abdomen/pelvis: 1. No acute intra-abdominal or intrapelvic  process. 2. Distal colonic diverticulosis without diverticulitis. 3. Stable midline fat containing ventral and umbilical hernias. 4.  Aortic Atherosclerosis (ICD10-I70.0). Electronically Signed   By: Randa Ngo M.D.   On: 11/11/2022 18:36   DG Chest 2 View  Result Date: 11/11/2022 CLINICAL DATA:  Infection.  On antibiotics.  Shortness of breath EXAM: CHEST - 2 VIEW COMPARISON:  X-ray 11/24/2020.  CT 09/20/2022 FINDINGS: Left chest pacemaker. Status post median sternotomy. Calcified aorta. Increasing left lung base opacity with tiny left effusion. Diffuse interstitial changes are seen with apical pleural thickening. Stable thickening along the minor fissure as well. Degenerative changes of the spine. Advanced degenerative changes of the shoulders. IMPRESSION: Postop chest with increasing opacity and tiny effusion at the left lung base. Possible infiltrate. Recommend follow-up. Electronically Signed   By: Jill Side M.D.   On: 11/11/2022 14:24    EKG: I independently viewed the EKG done and my findings are as followed: Unknown rhythm at rate of 78 bpm with increased PR interval  Assessment/Plan Present on Admission:  Pulmonary edema  Acute respiratory failure with hypoxia (HCC)  Stage 3b chronic kidney disease (CKD) (HCC)  Paroxysmal atrial fibrillation (HCC)  SSS (sick sinus syndrome) (HCC)  GERD (gastroesophageal reflux disease)  Essential hypertension  Acquired hypothyroidism  Principal Problem:   Pulmonary edema  Active Problems:   Stage 3b chronic kidney disease (CKD) (HCC)   Paroxysmal atrial fibrillation (HCC)   Essential hypertension   Acute respiratory failure with hypoxia (HCC)   GERD (gastroesophageal reflux disease)   SSS (sick sinus syndrome) (HCC)   Acquired hypothyroidism   Elevated brain natriuretic peptide (BNP) level   Iron deficiency anemia   Asthma, chronic   Shoulder pain  Pulmonary edema, rule out CHF Elevated BNP CT angiography of chest was suggestive of  bilateral pleural effusions and developing edema BNP was elevated at 590 Patient was symptomatic by being hypoxic and required supplemental oxygen to maintain normal saturations Continue total input/output, daily weights and fluid restriction Continue IV Lasix 20 mg twice daily Continue heart healthy diet  Echocardiogram done on 12/17/2021 showed LVEF of 60 to 65%.  No RWMA, LVDPs were normal.  Echocardiogram will be done in the morning   Acute respiratory failure with hypoxia in the setting of pulmonary edema Continue supplemental oxygen to maintain O2 sat > 92% with plan to wean patient off this as tolerated  Iron deficiency anemia Continue ferrous sulfate  CKD stage IIIb BUN/creatinine 85/1.36 (creatinine is within baseline range). Renally adjust medications, avoid nephrotoxic agents/dehydration/hypotension  Paroxysmal atrial fibrillation on Eliquis Continue Eliquis Continue Toprol-XL   GERD Continue Protonix  Essential hypertension Continue Toprol-XL  Continue IV hydralazine 10 mg every 6 hours as needed  Acquired hypothyroidism Continue Synthroid  Asthma Continue Albuterol, Breo and Incruse Ellipta  Shoulder pain Continue home gabapentin  DVT prophylaxis: Eliquis   Code Status: DNR   Family Communication: None at bedside   Consults: None  Severity of Illness: The appropriate patient status for this patient is INPATIENT. Inpatient status is judged to be reasonable and necessary in order to provide the required intensity of service to ensure the patient's safety. The patient's presenting symptoms, physical exam findings, and initial radiographic and laboratory data in the context of their chronic comorbidities is felt to place them at high risk for further clinical deterioration. Furthermore, it is not anticipated that the patient will be medically stable for discharge from the hospital within 2 midnights of admission.   * I certify that at the point of admission  it is my clinical judgment that the patient will require inpatient hospital care spanning beyond 2 midnights from the point of admission due to high intensity of service, high risk for further deterioration and high frequency of surveillance required.*  Author: Bernadette Hoit, DO 11/12/2022 12:44 AM  For on call review [email or web address].

## 2022-11-12 ENCOUNTER — Inpatient Hospital Stay (HOSPITAL_COMMUNITY): Payer: Medicare Other

## 2022-11-12 DIAGNOSIS — J810 Acute pulmonary edema: Secondary | ICD-10-CM | POA: Diagnosis not present

## 2022-11-12 DIAGNOSIS — I480 Paroxysmal atrial fibrillation: Secondary | ICD-10-CM | POA: Diagnosis not present

## 2022-11-12 DIAGNOSIS — I5021 Acute systolic (congestive) heart failure: Secondary | ICD-10-CM

## 2022-11-12 DIAGNOSIS — N1832 Chronic kidney disease, stage 3b: Secondary | ICD-10-CM | POA: Diagnosis not present

## 2022-11-12 DIAGNOSIS — I10 Essential (primary) hypertension: Secondary | ICD-10-CM | POA: Diagnosis not present

## 2022-11-12 DIAGNOSIS — M25519 Pain in unspecified shoulder: Secondary | ICD-10-CM | POA: Insufficient documentation

## 2022-11-12 LAB — COMPREHENSIVE METABOLIC PANEL
ALT: 10 U/L (ref 0–44)
AST: 19 U/L (ref 15–41)
Albumin: 3.1 g/dL — ABNORMAL LOW (ref 3.5–5.0)
Alkaline Phosphatase: 85 U/L (ref 38–126)
Anion gap: 12 (ref 5–15)
BUN: 19 mg/dL (ref 8–23)
CO2: 29 mmol/L (ref 22–32)
Calcium: 8.7 mg/dL — ABNORMAL LOW (ref 8.9–10.3)
Chloride: 94 mmol/L — ABNORMAL LOW (ref 98–111)
Creatinine, Ser: 1.17 mg/dL — ABNORMAL HIGH (ref 0.44–1.00)
GFR, Estimated: 42 mL/min — ABNORMAL LOW (ref >60)
Glucose, Bld: 93 mg/dL (ref 70–99)
Potassium: 3.6 mmol/L (ref 3.5–5.1)
Sodium: 135 mmol/L (ref 135–145)
Total Bilirubin: 0.7 mg/dL (ref 0.3–1.2)
Total Protein: 6.3 g/dL — ABNORMAL LOW (ref 6.5–8.1)

## 2022-11-12 LAB — ECHOCARDIOGRAM COMPLETE
AR max vel: 0.76 cm2
AV Area VTI: 0.83 cm2
AV Area mean vel: 0.88 cm2
AV Mean grad: 15 mmHg
AV Peak grad: 32.7 mmHg
Ao pk vel: 2.86 m/s
Area-P 1/2: 4.15 cm2
S' Lateral: 1.8 cm
Weight: 2522.06 oz

## 2022-11-12 LAB — CBC
HCT: 36.2 % (ref 36.0–46.0)
Hemoglobin: 11 g/dL — ABNORMAL LOW (ref 12.0–15.0)
MCH: 25.6 pg — ABNORMAL LOW (ref 26.0–34.0)
MCHC: 30.4 g/dL (ref 30.0–36.0)
MCV: 84.4 fL (ref 80.0–100.0)
Platelets: 226 10*3/uL (ref 150–400)
RBC: 4.29 MIL/uL (ref 3.87–5.11)
RDW: 16.5 % — ABNORMAL HIGH (ref 11.5–15.5)
WBC: 9.4 10*3/uL (ref 4.0–10.5)
nRBC: 0 % (ref 0.0–0.2)

## 2022-11-12 LAB — MAGNESIUM: Magnesium: 1.9 mg/dL (ref 1.7–2.4)

## 2022-11-12 LAB — PHOSPHORUS: Phosphorus: 2.7 mg/dL (ref 2.5–4.6)

## 2022-11-12 MED ORDER — PANTOPRAZOLE SODIUM 40 MG PO TBEC
40.0000 mg | DELAYED_RELEASE_TABLET | Freq: Two times a day (BID) | ORAL | Status: DC
  Administered 2022-11-12 – 2022-11-17 (×10): 40 mg via ORAL
  Filled 2022-11-12 (×10): qty 1

## 2022-11-12 MED ORDER — FUROSEMIDE 10 MG/ML IJ SOLN
20.0000 mg | Freq: Two times a day (BID) | INTRAMUSCULAR | Status: DC
  Administered 2022-11-12: 20 mg via INTRAVENOUS
  Filled 2022-11-12: qty 2

## 2022-11-12 MED ORDER — SODIUM CHLORIDE 0.9 % IV SOLN
1.0000 g | INTRAVENOUS | Status: DC
  Administered 2022-11-12 – 2022-11-16 (×5): 1 g via INTRAVENOUS
  Filled 2022-11-12 (×5): qty 10

## 2022-11-12 MED ORDER — ACETAMINOPHEN 325 MG PO TABS
650.0000 mg | ORAL_TABLET | Freq: Four times a day (QID) | ORAL | Status: DC | PRN
  Administered 2022-11-12 – 2022-11-13 (×2): 650 mg via ORAL
  Filled 2022-11-12 (×2): qty 2

## 2022-11-12 MED ORDER — ACETAMINOPHEN 650 MG RE SUPP: 650.0000 mg | Freq: Four times a day (QID) | RECTAL | Status: DC | PRN

## 2022-11-12 MED ORDER — MIRTAZAPINE 15 MG PO TABS
7.5000 mg | ORAL_TABLET | Freq: Every day | ORAL | Status: DC
  Administered 2022-11-12 – 2022-11-14 (×3): 7.5 mg via ORAL
  Filled 2022-11-12 (×3): qty 1

## 2022-11-12 MED ORDER — CALCIUM CARBONATE ANTACID 500 MG PO CHEW
400.0000 mg | CHEWABLE_TABLET | Freq: Three times a day (TID) | ORAL | Status: DC
  Administered 2022-11-12 – 2022-11-17 (×14): 400 mg via ORAL
  Filled 2022-11-12 (×15): qty 2

## 2022-11-12 MED ORDER — FUROSEMIDE 10 MG/ML IJ SOLN
40.0000 mg | Freq: Two times a day (BID) | INTRAMUSCULAR | Status: DC
  Administered 2022-11-12: 40 mg via INTRAVENOUS
  Filled 2022-11-12 (×3): qty 4

## 2022-11-12 MED ORDER — ONDANSETRON HCL 4 MG PO TABS: 4.0000 mg | ORAL_TABLET | Freq: Four times a day (QID) | ORAL | Status: DC | PRN

## 2022-11-12 MED ORDER — ONDANSETRON HCL 4 MG/2ML IJ SOLN
4.0000 mg | Freq: Four times a day (QID) | INTRAMUSCULAR | Status: DC | PRN
  Administered 2022-11-12: 4 mg via INTRAVENOUS
  Filled 2022-11-12: qty 2

## 2022-11-12 NOTE — Progress Notes (Signed)
PROGRESS NOTE     Amy [name], is a 89 y.o. female, DOB - 1922-06-20, NWG:[id number]  Admit date - 11/11/2022   Admitting Physician Bernadette Hoit, DO  Outpatient Primary MD for the patient is [facility], [facility]  LOS - 1  CC-- dyspnea       Brief Narrative:  89 y.o. female with medical history significant of asthma, paroxysmal atrial fibrillation on Eliquis, GERD, CAD s/p CABG x 3, hypertension, hypothyroidism admitted on 11/11/2021 with acute on chronic diastolic dysfunction CHF exacerbation and possible pneumonia with acute hypoxic respiratory failure    -Assessment and Plan: 1) acute on chronic diastolic dysfunction CHF exacerbation--echo on 11/12/2022 with EF of 55 to 60% with grade 1 diastolic dysfunction and moderate pulmonary hypertension, with moderate to severe aortic stenosis -Continue IV diuretics -Be careful not to significantly lower afterload in the setting of moderate to severe aortic stenosis -Daily weights fluid input and output monitoring  2) moderate to severe aortic stenosis--probably contributory to #1 above -Be careful not to significantly lower afterload in the setting of moderate to severe aortic stenosis  3)GERD/Anorexia--- patient with more than 10 pound weight loss due to poor oral intake lately  Remeron for appetite stimulation -Protonix and Tums for reflux symptoms  4) acute hypoxic respiratory failure--- due to CHF, weaning of oxygen with diuresis  5)Iron deficiency anemia Continue ferrous sulfate   6)CKD stage IIIb BUN/creatinine 85/1.36 (creatinine is within baseline range). Renally adjust medications, avoid nephrotoxic agents/dehydration/hypotension   7)Paroxysmal atrial fibrillation on Eliquis Continue Eliquis Continue Toprol-XL    8)GERD Continue Protonix   9)Essential hypertension Continue Toprol-XL  Continue IV hydralazine 10 mg every 6 hours as needed   10)Acquired hypothyroidism Continue Synthroid    11)Asthma Continue Albuterol, Breo and Incruse Ellipta  Status is: Inpatient   Disposition: The patient is from: Home              Anticipated d/c is to: Home              Anticipated d/c date is: 2 days              Patient currently is not medically stable to d/c. Barriers: Not Clinically Stable-   Code Status :  -  Code Status: DNR   Family Communication:  Discussed with son Juanda Crumble at bedside  DVT Prophylaxis  :   - SCDs  SCDs Start: 11/12/22 0043 apixaban (ELIQUIS) tablet 2.5 mg Start: 11/11/22 2245 apixaban (ELIQUIS) tablet 2.5 mg   Lab Results  Component Value Date   PLT 226 11/12/2022    Inpatient Medications  Scheduled Meds:  apixaban  2.5 mg Oral BID   calcium carbonate  400 mg of elemental calcium Oral TID   ferrous sulfate  325 mg Oral QODAY   fluticasone furoate-vilanterol  1 puff Inhalation Daily   And   umeclidinium bromide  1 puff Inhalation Daily   furosemide  40 mg Intravenous Q12H   levothyroxine  75 mcg Oral Q0600   metoprolol succinate  25 mg Oral Daily   mirtazapine  7.5 mg Oral QHS   pantoprazole  40 mg Oral BID   Continuous Infusions:  cefTRIAXone (ROCEPHIN)  IV 1 g (11/12/22 1305)   PRN Meds:.acetaminophen **OR** acetaminophen, albuterol, hydrALAZINE, ondansetron **OR** ondansetron (ZOFRAN) IV   Anti-infectives (From admission, onward)    Start     Dose/Rate Route Frequency Ordered Stop   11/12/22 1300  cefTRIAXone (ROCEPHIN) 1 g in sodium chloride 0.9 % 100 mL IVPB  1 g 200 mL/hr over 30 Minutes Intravenous Every 24 hours 11/12/22 1206           Subjective: Amy Pitz today has no fevers, no emesis,  No chest pain,   -Patient's son Juanda Crumble is at bedside -Patient reports reflux symptoms and poor appetite -Voiding well -Dyspnea is not worse   Objective: Vitals:   11/12/22 0614 11/12/22 0844 11/12/22 1215 11/12/22 1922  BP: (!) 156/61 (!) 168/67 (!) 184/61 (!) 145/48  Pulse: 74 66 65 63  Resp: '18  20 18  '$ Temp: 99.2  F (37.3 C)  99.6 F (37.6 C) (!) 100.6 F (38.1 C)  TempSrc: Oral   Oral  SpO2: 94% 96% 96% 94%  Weight:        Intake/Output Summary (Last 24 hours) at 11/12/2022 2008 Last data filed at 11/12/2022 1700 Gross per 24 hour  Intake 340.98 ml  Output 1000 ml  Net -659.02 ml   Filed Weights   11/11/22 2251 11/12/22 0500  Weight: 73.9 kg 71.5 kg    Physical Exam  Gen:- Awake Alert,  in no apparent distress  HEENT:- [place].AT, No sclera icterus Nose- [place] 2L/min Neck-Supple Neck,No JVD,.  Lungs-fair air movement, no wheezing  CV- S1, S2 normal, regular, 3/6 SM  Abd-  +ve B.Sounds, Abd Soft, No tenderness,    Extremity/Skin:- No significant  edema, pedal pulses present  Psych-affect is appropriate, oriented x3 Neuro-no new focal deficits, no tremors  Data Reviewed: I have personally reviewed following labs and imaging studies  CBC: Recent Labs  Lab 11/11/22 1246 11/12/22 0327  WBC 9.5 9.4  NEUTROABS 7.1  --   HGB 11.2* 11.0*  HCT 37.9 36.2  MCV 85.2 84.4  PLT 237 073   Basic Metabolic Panel: Recent Labs  Lab 11/11/22 1246 11/12/22 0327  NA 135 135  K 3.9 3.6  CL 96* 94*  CO2 29 29  GLUCOSE 96 93  BUN 25* 19  CREATININE 1.36* 1.17*  CALCIUM 9.1 8.7*  MG 2.1 1.9  PHOS  --  2.7   GFR: Estimated Creatinine Clearance: 26.3 mL/min (A) (by C-G formula based on SCr of 1.17 mg/dL (H)). Liver Function Tests: Recent Labs  Lab 11/11/22 1457 11/12/22 0327  AST 20 19  ALT 11 10  ALKPHOS 88 85  BILITOT 0.7 0.7  PROT 6.7 6.3*  ALBUMIN 3.2* 3.1*   Recent Results (from the past 240 hour(s))  Urine Culture     Status: Abnormal (Preliminary result)   Collection Time: 11/11/22  2:30 PM   Specimen: Urine, Clean Catch  Result Value Ref Range Status   Specimen Description   Final    URINE, CLEAN CATCH Performed at [facility], [address], [place], [place] [address]    Special Requests   Final    NONE Performed at [facility], [address], [place], [place]  [address]    Culture (A)  Final    10,000 COLONIES/mL PROTEUS MIRABILIS SUSCEPTIBILITIES TO FOLLOW Performed at [facility], [place] [address], [place], [place] [address]    Report Status PENDING  Incomplete  Blood culture (routine x 2)     Status: None (Preliminary result)   Collection Time: 11/11/22  2:57 PM   Specimen: BLOOD RIGHT ARM  Result Value Ref Range Status   Specimen Description   Final    BLOOD RIGHT ARM BOTTLES DRAWN AEROBIC AND ANAEROBIC   Special Requests Blood Culture adequate volume  Final   Culture   Final  NO GROWTH < 12 HOURS Performed at [facility], [address], [place], [place] [address]    Report Status PENDING  Incomplete  Blood culture (routine x 2)     Status: None (Preliminary result)   Collection Time: 11/11/22  2:57 PM   Specimen: Left Antecubital; Blood  Result Value Ref Range Status   Specimen Description   Final    LEFT ANTECUBITAL BOTTLES DRAWN AEROBIC AND ANAEROBIC   Special Requests Blood Culture adequate volume  Final   Culture   Final    NO GROWTH < 12 HOURS Performed at [facility], [address] [place], [place] [address]    Report Status PENDING  Incomplete  Resp panel by RT-PCR (RSV, Flu A&B, Covid) Anterior Nasal Swab     Status: None   Collection Time: 11/11/22  4:25 PM   Specimen: Anterior Nasal Swab  Result Value Ref Range Status   SARS Coronavirus 2 by RT PCR NEGATIVE NEGATIVE Final    Comment: (NOTE) SARS-CoV-2 target nucleic acids are NOT DETECTED.  The SARS-CoV-2 RNA is generally detectable in upper respiratory specimens during the acute phase of infection. The lowest concentration of SARS-CoV-2 viral copies this assay can detect is 138 copies/mL. A negative result does not preclude SARS-Cov-2 infection and should not be used as the sole basis for treatment or other patient management decisions. A negative result may occur with  improper specimen collection/handling, submission of specimen other than  nasopharyngeal swab, presence of viral mutation(s) within the areas targeted by this assay, and inadequate number of viral copies(<138 copies/mL). A negative result must be combined with clinical observations, patient history, and epidemiological information. The expected result is Negative.  Fact Sheet for Patients:  EntrepreneurPulse.com.au  Fact Sheet for Healthcare Providers:  IncredibleEmployment.be  This test is no t yet approved or cleared by the [place] FDA and  has been authorized for detection and/or diagnosis of SARS-CoV-2 by FDA under an Emergency Use Authorization (EUA). This EUA will remain  in effect (meaning this test can be used) for the duration of the COVID-19 declaration under Section 564(b)(1) of the Act, 21 U.S.C.section 360bbb-3(b)(1), unless the authorization is terminated  or revoked sooner.       Influenza A by PCR NEGATIVE NEGATIVE Final   Influenza B by PCR NEGATIVE NEGATIVE Final    Comment: (NOTE) The Xpert Xpress SARS-CoV-2/FLU/RSV plus assay is intended as an aid in the diagnosis of influenza from Nasopharyngeal swab specimens and should not be used as a sole basis for treatment. Nasal washings and aspirates are unacceptable for Xpert Xpress SARS-CoV-2/FLU/RSV testing.  Fact Sheet for Patients: EntrepreneurPulse.com.au  Fact Sheet for Healthcare Providers: IncredibleEmployment.be  This test is not yet approved or cleared by the [place] FDA and has been authorized for detection and/or diagnosis of SARS-CoV-2 by FDA under an Emergency Use Authorization (EUA). This EUA will remain in effect (meaning this test can be used) for the duration of the COVID-19 declaration under Section 564(b)(1) of the Act, 21 U.S.C. section 360bbb-3(b)(1), unless the authorization is terminated or revoked.     Resp Syncytial Virus by PCR NEGATIVE NEGATIVE Final    Comment:  (NOTE) Fact Sheet for Patients: EntrepreneurPulse.com.au  Fact Sheet for Healthcare Providers: IncredibleEmployment.be  This test is not yet approved or cleared by the [place] FDA and has been authorized for detection and/or diagnosis of SARS-CoV-2 by FDA under an Emergency Use Authorization (EUA). This EUA will remain in effect (meaning this test can be used)  for the duration of the COVID-19 declaration under Section 564(b)(1) of the Act, 21 U.S.C. section 360bbb-3(b)(1), unless the authorization is terminated or revoked.  Performed at [facility], [address] [place], [place] [address]       Radiology Studies: ECHOCARDIOGRAM COMPLETE  Result Date: 11/12/2022    ECHOCARDIOGRAM REPORT   Patient Name:   Amy M Dalia Date of Exam: 11/12/2022 Medical Rec #:  [id number]     Height:       65.5 in Accession #:    [id number]    Weight:       157.6 lb Date of Birth:  1922-10-05      BSA:          1.798 m Patient Age:    29 years      BP:           168/67 mmHg Patient Gender: F             HR:           66 bpm. Exam Location:  [facility] Procedure: 2D Echo, Color Doppler and Cardiac Doppler Indications:    V78.58 Acute systolic (congestive) heart failure  History:        Patient has prior history of Echocardiogram examinations, most                 recent 12/17/2021. CAD, Pacemaker and Prior Cardiac Surgery,                 Arrythmias:Atrial Fibrillation; Risk Factors:Hypertension and                 Dyslipidemia.  Sonographer:    Raquel Sarna Senior RDCS Referring Phys: [id number] OLADAPO ADEFESO IMPRESSIONS  1. Left ventricular ejection fraction, by estimation, is 55 to 60%. The left ventricle has normal function. The left ventricle has no regional wall motion abnormalities. Left ventricular diastolic parameters are consistent with Grade I diastolic dysfunction (impaired relaxation).  2. Pacing wires in RA/RV . Right ventricular systolic function is normal. The right  ventricular size is normal. There is moderately elevated pulmonary artery systolic pressure.  3. The mitral valve is degenerative. Trivial mitral valve regurgitation. No evidence of mitral stenosis.  4. Tricuspid valve regurgitation is mild to moderate.  5. Gradients have gone up since TTE done 12/17/21 LVOT diameter used on this TTE 1.9 rather than 1.9 so AVA smaller Overal with mean gradient 15 DVI 0.37 thinks this is more moderate-severe AS despite calculated AVA of 0.83 cm2 . The aortic valve is tricuspid. There is severe calcifcation of the aortic valve. There is severe thickening of the aortic valve. Aortic valve regurgitation is not visualized. Moderate to severe aortic valve stenosis.  6. The inferior vena cava is normal in size with greater than 50% respiratory variability, suggesting right atrial pressure of 3 mmHg. FINDINGS  Left Ventricle: Left ventricular ejection fraction, by estimation, is 55 to 60%. The left ventricle has normal function. The left ventricle has no regional wall motion abnormalities. The left ventricular internal cavity size was normal in size. There is  no left ventricular hypertrophy. Left ventricular diastolic parameters are consistent with Grade I diastolic dysfunction (impaired relaxation). Right Ventricle: Pacing wires in RA/RV. The right ventricular size is normal. No increase in right ventricular wall thickness. Right ventricular systolic function is normal. There is moderately elevated pulmonary artery systolic pressure. The tricuspid regurgitant velocity is 3.35 m/s, and with an assumed right atrial pressure of 3 mmHg, the estimated right ventricular  systolic pressure is 10.2 mmHg. Left Atrium: Left atrial size was normal in size. Right Atrium: Right atrial size was normal in size. Pericardium: There is no evidence of pericardial effusion. Mitral Valve: The mitral valve is degenerative in appearance. There is mild thickening of the mitral valve leaflet(s). There is mild  calcification of the mitral valve leaflet(s). Mild mitral annular calcification. Trivial mitral valve regurgitation. No evidence of mitral valve stenosis. Tricuspid Valve: The tricuspid valve is normal in structure. Tricuspid valve regurgitation is mild to moderate. No evidence of tricuspid stenosis. Aortic Valve: Gradients have gone up since TTE done 12/17/21 LVOT diameter used on this TTE 1.9 rather than 1.9 so AVA smaller Overal with mean gradient 15 DVI 0.37 thinks this is more moderate-severe AS despite calculated AVA of 0.83 cm2. The aortic valve is tricuspid. There is severe calcifcation of the aortic valve. There is severe thickening of the aortic valve. Aortic valve regurgitation is not visualized. Moderate to severe aortic stenosis is present. Aortic valve mean gradient measures 15.0 mmHg. Aortic valve peak gradient measures 32.7 mmHg. Aortic valve area, by VTI measures 0.83 cm. Pulmonic Valve: The pulmonic valve was normal in structure. Pulmonic valve regurgitation is trivial. No evidence of pulmonic stenosis. Aorta: The aortic root is normal in size and structure. Venous: The inferior vena cava is normal in size with greater than 50% respiratory variability, suggesting right atrial pressure of 3 mmHg. IAS/Shunts: No atrial level shunt detected by color flow Doppler.  LEFT VENTRICLE PLAX 2D LVIDd:         2.65 cm   Diastology LVIDs:         1.80 cm   LV e' medial:    5.55 cm/s LV PW:         0.80 cm   LV E/e' medial:  18.2 LV IVS:        1.05 cm   LV e' lateral:   7.29 cm/s LVOT diam:     1.70 cm   LV E/e' lateral: 13.9 LV SV:         44 LV SV Index:   24 LVOT Area:     2.27 cm  RIGHT VENTRICLE RV S prime:     8.81 cm/s TAPSE (M-mode): 1.9 cm LEFT ATRIUM             Index        RIGHT ATRIUM           Index LA diam:        2.90 cm 1.61 cm/m   RA Area:     18.60 cm LA Vol (A2C):   82.3 ml 45.77 ml/m  RA Volume:   51.00 ml  28.37 ml/m LA Vol (A4C):   46.5 ml 25.86 ml/m LA Biplane Vol: 65.0 ml 36.15  ml/m  AORTIC VALVE AV Area (Vmax):    0.76 cm AV Area (Vmean):   0.88 cm AV Area (VTI):     0.83 cm AV Vmax:           286.00 cm/s AV Vmean:          173.000 cm/s AV VTI:            0.530 m AV Peak Grad:      32.7 mmHg AV Mean Grad:      15.0 mmHg LVOT Vmax:         96.00 cm/s LVOT Vmean:        67.300 cm/s LVOT VTI:  0.194 m LVOT/AV VTI ratio: 0.37  AORTA Ao Root diam: 3.30 cm Ao Asc diam:  3.30 cm MITRAL VALVE                TRICUSPID VALVE MV Area (PHT): 4.15 cm     TR Peak grad:   44.9 mmHg MV Decel Time: 183 msec     TR Vmax:        335.00 cm/s MV E velocity: 101.00 cm/s MV A velocity: 105.00 cm/s  SHUNTS MV E/A ratio:  0.96         Systemic VTI:  0.19 m                             Systemic Diam: 1.70 cm Jenkins Rouge MD Electronically signed by Jenkins Rouge MD Signature Date/Time: 11/12/2022/10:56:29 AM    Final    CT Angio Chest PE W and/or Wo Contrast  Result Date: 11/11/2022 CLINICAL DATA:  Nausea and vomiting, history of UTI, hypertension EXAM: CT ANGIOGRAPHY CHEST CT ABDOMEN AND PELVIS WITH CONTRAST TECHNIQUE: Multidetector CT imaging of the chest was performed using the standard protocol during bolus administration of intravenous contrast. Multiplanar CT image reconstructions and MIPs were obtained to evaluate the vascular anatomy. Multidetector CT imaging of the abdomen and pelvis was performed using the standard protocol during bolus administration of intravenous contrast. RADIATION DOSE REDUCTION: This exam was performed according to the departmental dose-optimization program which includes automated exposure control, adjustment of the mA and/or kV according to patient size and/or use of iterative reconstruction technique. CONTRAST:  82m OMNIPAQUE IOHEXOL 350 MG/ML SOLN COMPARISON:  09/20/2022 FINDINGS: CTA CHEST FINDINGS Cardiovascular: This is a technically adequate evaluation of the pulmonary vasculature. No filling defects or pulmonary emboli. Mild cardiomegaly without pericardial  effusion. Reflux of contrast into the hepatic veins suggests underlying cardiac dysfunction. No evidence of thoracic aortic aneurysm or dissection. Atherosclerosis of the aorta and native coronary vasculature unchanged. Prior CABG. Mediastinum/Nodes: No enlarged mediastinal, hilar, or axillary lymph nodes. Thyroid gland, trachea, and esophagus demonstrate no significant findings. Lungs/Pleura: Trace bilateral pleural effusions, left greater than right. There is interlobular septal thickening, with patchy areas of ground-glass airspace disease in the upper lung zones, favor fluid overload and developing edema. No pneumothorax. There is bilateral bronchial wall thickening greatest in the left lower lobe, with partial opacification likely reflecting mucoid impaction or aspiration. Musculoskeletal: No acute or destructive bony lesions. Severe bilateral shoulder osteoarthritis. Reconstructed images demonstrate no additional findings. Review of the MIP images confirms the above findings. CT ABDOMEN and PELVIS FINDINGS Hepatobiliary: No focal liver abnormality is seen. No gallstones, gallbladder wall thickening, or biliary dilatation. Pancreas: Unremarkable. No pancreatic ductal dilatation or surrounding inflammatory changes. Spleen: Normal in size without focal abnormality. Adrenals/Urinary Tract: Numerous bilateral renal cortical cysts are unchanged, and do not require imaging follow-up. No urinary tract calculi or obstruction. The adrenals are unremarkable. The bladder is decompressed, limiting its evaluation. Stomach/Bowel: No bowel obstruction or ileus. Diverticulosis of the sigmoid colon without diverticulitis. Normal retrocecal appendix. No bowel wall thickening or inflammatory change. Vascular/Lymphatic: Aortic atherosclerosis. No enlarged abdominal or pelvic lymph nodes. Reproductive: Uterus and bilateral adnexa are unremarkable. Other: No free fluid or free intraperitoneal gas. Stable fat containing umbilical and  supraumbilical midline ventral hernias. No bowel herniation. Musculoskeletal: Bilateral hip arthroplasties. No acute or destructive bony abnormality. Reconstructed images demonstrate no additional findings. Review of the MIP images confirms the above findings. IMPRESSION: Chest: 1. No  evidence of pulmonary embolus. 2. Cardiomegaly, with reflux of contrast into the hepatic veins suggesting underlying cardiac dysfunction. 3. Trace bilateral pleural effusions, with interlobular septal thickening and scattered ground-glass airspace disease consistent with fluid overload and developing edema. 4. Bilateral bronchial wall thickening, greatest in the left lower lobe with partial airway opacification. This could reflect mucoid impaction or aspiration. 5. Aortic Atherosclerosis (ICD10-I70.0). Coronary artery atherosclerosis. Abdomen/pelvis: 1. No acute intra-abdominal or intrapelvic process. 2. Distal colonic diverticulosis without diverticulitis. 3. Stable midline fat containing ventral and umbilical hernias. 4.  Aortic Atherosclerosis (ICD10-I70.0). Electronically Signed   By: Randa Ngo M.D.   On: 11/11/2022 18:36   CT ABDOMEN PELVIS W CONTRAST  Result Date: 11/11/2022 CLINICAL DATA:  Nausea and vomiting, history of UTI, hypertension EXAM: CT ANGIOGRAPHY CHEST CT ABDOMEN AND PELVIS WITH CONTRAST TECHNIQUE: Multidetector CT imaging of the chest was performed using the standard protocol during bolus administration of intravenous contrast. Multiplanar CT image reconstructions and MIPs were obtained to evaluate the vascular anatomy. Multidetector CT imaging of the abdomen and pelvis was performed using the standard protocol during bolus administration of intravenous contrast. RADIATION DOSE REDUCTION: This exam was performed according to the departmental dose-optimization program which includes automated exposure control, adjustment of the mA and/or kV according to patient size and/or use of iterative reconstruction  technique. CONTRAST:  48m OMNIPAQUE IOHEXOL 350 MG/ML SOLN COMPARISON:  09/20/2022 FINDINGS: CTA CHEST FINDINGS Cardiovascular: This is a technically adequate evaluation of the pulmonary vasculature. No filling defects or pulmonary emboli. Mild cardiomegaly without pericardial effusion. Reflux of contrast into the hepatic veins suggests underlying cardiac dysfunction. No evidence of thoracic aortic aneurysm or dissection. Atherosclerosis of the aorta and native coronary vasculature unchanged. Prior CABG. Mediastinum/Nodes: No enlarged mediastinal, hilar, or axillary lymph nodes. Thyroid gland, trachea, and esophagus demonstrate no significant findings. Lungs/Pleura: Trace bilateral pleural effusions, left greater than right. There is interlobular septal thickening, with patchy areas of ground-glass airspace disease in the upper lung zones, favor fluid overload and developing edema. No pneumothorax. There is bilateral bronchial wall thickening greatest in the left lower lobe, with partial opacification likely reflecting mucoid impaction or aspiration. Musculoskeletal: No acute or destructive bony lesions. Severe bilateral shoulder osteoarthritis. Reconstructed images demonstrate no additional findings. Review of the MIP images confirms the above findings. CT ABDOMEN and PELVIS FINDINGS Hepatobiliary: No focal liver abnormality is seen. No gallstones, gallbladder wall thickening, or biliary dilatation. Pancreas: Unremarkable. No pancreatic ductal dilatation or surrounding inflammatory changes. Spleen: Normal in size without focal abnormality. Adrenals/Urinary Tract: Numerous bilateral renal cortical cysts are unchanged, and do not require imaging follow-up. No urinary tract calculi or obstruction. The adrenals are unremarkable. The bladder is decompressed, limiting its evaluation. Stomach/Bowel: No bowel obstruction or ileus. Diverticulosis of the sigmoid colon without diverticulitis. Normal retrocecal appendix. No  bowel wall thickening or inflammatory change. Vascular/Lymphatic: Aortic atherosclerosis. No enlarged abdominal or pelvic lymph nodes. Reproductive: Uterus and bilateral adnexa are unremarkable. Other: No free fluid or free intraperitoneal gas. Stable fat containing umbilical and supraumbilical midline ventral hernias. No bowel herniation. Musculoskeletal: Bilateral hip arthroplasties. No acute or destructive bony abnormality. Reconstructed images demonstrate no additional findings. Review of the MIP images confirms the above findings. IMPRESSION: Chest: 1. No evidence of pulmonary embolus. 2. Cardiomegaly, with reflux of contrast into the hepatic veins suggesting underlying cardiac dysfunction. 3. Trace bilateral pleural effusions, with interlobular septal thickening and scattered ground-glass airspace disease consistent with fluid overload and developing edema. 4. Bilateral bronchial wall thickening, greatest in the  left lower lobe with partial airway opacification. This could reflect mucoid impaction or aspiration. 5. Aortic Atherosclerosis (ICD10-I70.0). Coronary artery atherosclerosis. Abdomen/pelvis: 1. No acute intra-abdominal or intrapelvic process. 2. Distal colonic diverticulosis without diverticulitis. 3. Stable midline fat containing ventral and umbilical hernias. 4.  Aortic Atherosclerosis (ICD10-I70.0). Electronically Signed   By: Randa Ngo M.D.   On: 11/11/2022 18:36   DG Chest 2 View  Result Date: 11/11/2022 CLINICAL DATA:  Infection.  On antibiotics.  Shortness of breath EXAM: CHEST - 2 VIEW COMPARISON:  X-ray 11/24/2020.  CT 09/20/2022 FINDINGS: Left chest pacemaker. Status post median sternotomy. Calcified aorta. Increasing left lung base opacity with tiny left effusion. Diffuse interstitial changes are seen with apical pleural thickening. Stable thickening along the minor fissure as well. Degenerative changes of the spine. Advanced degenerative changes of the shoulders. IMPRESSION:  Postop chest with increasing opacity and tiny effusion at the left lung base. Possible infiltrate. Recommend follow-up. Electronically Signed   By: Jill Side M.D.   On: 11/11/2022 14:24     Scheduled Meds:  apixaban  2.5 mg Oral BID   calcium carbonate  400 mg of elemental calcium Oral TID   ferrous sulfate  325 mg Oral QODAY   fluticasone furoate-vilanterol  1 puff Inhalation Daily   And   umeclidinium bromide  1 puff Inhalation Daily   furosemide  40 mg Intravenous Q12H   levothyroxine  75 mcg Oral Q0600   metoprolol succinate  25 mg Oral Daily   mirtazapine  7.5 mg Oral QHS   pantoprazole  40 mg Oral BID   Continuous Infusions:  cefTRIAXone (ROCEPHIN)  IV 1 g (11/12/22 1305)     LOS: 1 day    Roxan Hockey M.D on 11/12/2022 at 8:08 PM  Go to [email or web address] - for contact info  [facility] - Office  [phone]  If 7PM-7AM, please contact night-coverage [email or web address] 11/12/2022, 8:08 PM

## 2022-11-12 NOTE — Progress Notes (Signed)
  Transition of Care [facility]) Screening Note   Patient Details  Name: Amy [name] Date of Birth: 1922-02-09   Transition of [facility]) CM/SW Contact:    Iona Beard, [place] Phone Number: 11/12/2022, 12:19 PM    Transition of Care Department [facility]) has reviewed patient and no TOC needs have been identified at this time. We will continue to monitor patient advancement through interdisciplinary progression rounds. If new patient transition needs arise, please place a TOC consult.

## 2022-11-12 NOTE — Progress Notes (Signed)
Echocardiogram 2D Echocardiogram has been performed.  Oneal Deputy Kurt Hoffmeier RDCS 11/12/2022, 10:45 AM

## 2022-11-13 DIAGNOSIS — J810 Acute pulmonary edema: Secondary | ICD-10-CM | POA: Diagnosis not present

## 2022-11-13 DIAGNOSIS — J9601 Acute respiratory failure with hypoxia: Secondary | ICD-10-CM | POA: Diagnosis not present

## 2022-11-13 DIAGNOSIS — I480 Paroxysmal atrial fibrillation: Secondary | ICD-10-CM | POA: Diagnosis not present

## 2022-11-13 LAB — BASIC METABOLIC PANEL
Anion gap: 10 (ref 5–15)
BUN: 24 mg/dL — ABNORMAL HIGH (ref 8–23)
CO2: 32 mmol/L (ref 22–32)
Calcium: 8.9 mg/dL (ref 8.9–10.3)
Chloride: 92 mmol/L — ABNORMAL LOW (ref 98–111)
Creatinine, Ser: 1.64 mg/dL — ABNORMAL HIGH (ref 0.44–1.00)
GFR, Estimated: 28 mL/min — ABNORMAL LOW (ref >60)
Glucose, Bld: 105 mg/dL — ABNORMAL HIGH (ref 70–99)
Potassium: 3.2 mmol/L — ABNORMAL LOW (ref 3.5–5.1)
Sodium: 134 mmol/L — ABNORMAL LOW (ref 135–145)

## 2022-11-13 LAB — URINE CULTURE: Culture: 10000 — AB

## 2022-11-13 MED ORDER — FUROSEMIDE 10 MG/ML IJ SOLN
40.0000 mg | Freq: Every day | INTRAMUSCULAR | Status: DC
  Administered 2022-11-14: 40 mg via INTRAVENOUS
  Filled 2022-11-13: qty 4

## 2022-11-13 MED ORDER — POTASSIUM CHLORIDE CRYS ER 20 MEQ PO TBCR
40.0000 meq | EXTENDED_RELEASE_TABLET | ORAL | Status: AC
  Administered 2022-11-13 (×2): 40 meq via ORAL
  Filled 2022-11-13 (×2): qty 2

## 2022-11-13 NOTE — Progress Notes (Signed)
RN end of shift report: Patient's po liquid intake was 120 mL. Output was 60 mL via Purewick. Bladder scan was 160 mL at 0600. Scheduled IV lasix administered at bedtime. During shift, fever of 100.6 F, oral temp. Tylenol administered; oral temp at 0540 decreased to 98.4 F. Daughter at bedside all night, and reported to RN that patient slept most of the day on 1/13, and all night (this shift), which is unusual for her.

## 2022-11-13 NOTE — Progress Notes (Signed)
PROGRESS NOTE     Amy [name], is a 89 y.o. female, DOB - 03-Feb-1922, DVV:[id number]  Admit date - 11/11/2022   Admitting Physician Bernadette Hoit, DO  Outpatient Primary MD for the patient is [facility], [facility]  LOS - 2  CC-- dyspnea      Brief Narrative:  89 y.o. female with medical history significant of asthma, paroxysmal atrial fibrillation on Eliquis, GERD, CAD s/p CABG x 3, hypertension, hypothyroidism admitted on 11/11/2021 with acute on chronic diastolic dysfunction CHF exacerbation and possible pneumonia with acute hypoxic respiratory failure    -Assessment and Plan: 1) acute on chronic diastolic dysfunction CHF exacerbation--echo on 11/12/2022 with EF of 55 to 60% with grade 1 diastolic dysfunction and moderate pulmonary hypertension, with moderate to severe aortic stenosis -Be careful not to significantly lower afterload in the setting of moderate to severe aortic stenosis -Daily weights fluid input and output monitoring 11/13/22 --Bed scale weight appears to be inconsistent and probably unreliable -Creatinine trending up.... Up to 1.6 from 1.1 on admission Please Note that it was 1.78 on 01/20/2022, and 1.92 on 02/06/2023 -Lasix adjusted   2)Moderate to severe aortic stenosis--probably contributory to #1 above -Be careful not to significantly lower afterload in the setting of moderate to severe aortic stenosis  3)GERD/Anorexia--- patient with more than 10 pound weight loss due to poor oral intake lately  Remeron for appetite stimulation -Protonix and Tums for reflux symptoms  4) acute hypoxic respiratory failure--- due to CHF, weaning of oxygen with diuresis  5)Iron deficiency anemia Continue ferrous sulfate   6)CKD stage IIIb -Creatinine trending up.... Up to 1.6 from 1.1 on admission Please Note that it was 1.78 on 01/20/2022, and 1.92 on 02/06/2023 Renally adjust medications, avoid nephrotoxic agents/dehydration/hypotension   7)Paroxysmal atrial  fibrillation -stable Continue Eliquis Continue Toprol-XL    8)GERD Continue Protonix   9)Essential hypertension Continue Toprol-XL  Continue IV hydralazine 10 mg every 6 hours as needed   10)Acquired hypothyroidism Continue Synthroid   11)Asthma Continue Albuterol, Breo and Incruse Ellipta  Status is: Inpatient   Disposition: The patient is from: Home              Anticipated d/c is to: Home              Anticipated d/c date is: 2 days              Patient currently is not medically stable to d/c. Barriers: Not Clinically Stable-   Code Status :  -  Code Status: DNR   Family Communication:  Discussed with son Juanda Crumble,  Daughter Francie Massing and grand daughter Joellen Jersey at bedside at bedside  DVT Prophylaxis  :   - SCDs  SCDs Start: 11/12/22 0043 apixaban (ELIQUIS) tablet 2.5 mg Start: 11/11/22 2245 apixaban (ELIQUIS) tablet 2.5 mg   Lab Results  Component Value Date   PLT 226 11/12/2022    Inpatient Medications  Scheduled Meds:  apixaban  2.5 mg Oral BID   calcium carbonate  400 mg of elemental calcium Oral TID   ferrous sulfate  325 mg Oral QODAY   fluticasone furoate-vilanterol  1 puff Inhalation Daily   And   umeclidinium bromide  1 puff Inhalation Daily   [START ON 11/14/2022] furosemide  40 mg Intravenous Daily   levothyroxine  75 mcg Oral Q0600   metoprolol succinate  25 mg Oral Daily   mirtazapine  7.5 mg Oral QHS   pantoprazole  40 mg Oral BID   Continuous Infusions:  cefTRIAXone (ROCEPHIN)  IV 1 g (11/13/22 1211)   PRN Meds:.acetaminophen **OR** acetaminophen, albuterol, hydrALAZINE, ondansetron **OR** ondansetron (ZOFRAN) IV   Anti-infectives (From admission, onward)    Start     Dose/Rate Route Frequency Ordered Stop   11/12/22 1300  cefTRIAXone (ROCEPHIN) 1 g in sodium chloride 0.9 % 100 mL IVPB        1 g 200 mL/hr over 30 Minutes Intravenous Every 24 hours 11/12/22 1206         Subjective: Amy Abrigo today has no fevers, no emesis,  No  chest pain,    Daughter Francie Massing and grand daughter Joellen Jersey at bedside -Questions answered -Dyspnea improving -Not voiding as much as previously   Objective: Vitals:   11/13/22 0245 11/13/22 0500 11/13/22 0542 11/13/22 1002  BP:   (!) 115/51   Pulse:   62   Resp:   18   Temp: 97.8 F (36.6 C)  98.4 F (36.9 C)   TempSrc: Axillary  Oral   SpO2:   91% 92%  Weight:  74.1 kg      Intake/Output Summary (Last 24 hours) at 11/13/2022 1727 Last data filed at 11/13/2022 1200 Gross per 24 hour  Intake 600 ml  Output 60 ml  Net 540 ml   Filed Weights   11/11/22 2251 11/12/22 0500 11/13/22 0500  Weight: 73.9 kg 71.5 kg 74.1 kg   Physical Exam  Gen:- Awake Alert,  in no apparent distress  HEENT:- [place].AT, No sclera icterus Nose- [place] 2L/min Neck-Supple Neck,No JVD,.  Lungs-fair air movement, no wheezing  CV- S1, S2 normal, regular, 3/6 SM  Abd-  +ve B.Sounds, Abd Soft, No tenderness,  No CVA tenderness Extremity/Skin:- No significant  edema, pedal pulses present  Psych-affect is appropriate, oriented x3 Neuro-Generalized weakness, no new focal deficits, no tremors  Data Reviewed: I have personally reviewed following labs and imaging studies  CBC: Recent Labs  Lab 11/11/22 1246 11/12/22 0327  WBC 9.5 9.4  NEUTROABS 7.1  --   HGB 11.2* 11.0*  HCT 37.9 36.2  MCV 85.2 84.4  PLT 237 235   Basic Metabolic Panel: Recent Labs  Lab 11/11/22 1246 11/12/22 0327 11/13/22 0433  NA 135 135 134*  K 3.9 3.6 3.2*  CL 96* 94* 92*  CO2 29 29 32  GLUCOSE 96 93 105*  BUN 25* 19 24*  CREATININE 1.36* 1.17* 1.64*  CALCIUM 9.1 8.7* 8.9  MG 2.1 1.9  --   PHOS  --  2.7  --    GFR: Estimated Creatinine Clearance: 19.1 mL/min (A) (by C-G formula based on SCr of 1.64 mg/dL (H)). Liver Function Tests: Recent Labs  Lab 11/11/22 1457 11/12/22 0327  AST 20 19  ALT 11 10  ALKPHOS 88 85  BILITOT 0.7 0.7  PROT 6.7 6.3*  ALBUMIN 3.2* 3.1*   Recent Results (from the past 240  hour(s))  Urine Culture     Status: Abnormal   Collection Time: 11/11/22  2:30 PM   Specimen: Urine, Clean Catch  Result Value Ref Range Status   Specimen Description   Final    URINE, CLEAN CATCH Performed at [facility], [address], [place], [place] [address]    Special Requests   Final    NONE Performed at [facility], [address] [place], [place] [address]    Culture 10,000 COLONIES/mL PROTEUS MIRABILIS (A)  Final   Report Status 11/13/2022 FINAL  Final   Organism ID, Bacteria PROTEUS MIRABILIS (A)  Final  Susceptibility   Proteus mirabilis - MIC*    AMPICILLIN <=2 SENSITIVE Sensitive     CEFAZOLIN 8 SENSITIVE Sensitive     CEFEPIME <=0.12 SENSITIVE Sensitive     CEFTRIAXONE <=0.25 SENSITIVE Sensitive     CIPROFLOXACIN >=4 RESISTANT Resistant     GENTAMICIN 8 INTERMEDIATE Intermediate     IMIPENEM 2 SENSITIVE Sensitive     NITROFURANTOIN RESISTANT Resistant     TRIMETH/SULFA >=320 RESISTANT Resistant     AMPICILLIN/SULBACTAM <=2 SENSITIVE Sensitive     PIP/TAZO <=4 SENSITIVE Sensitive     * 10,000 COLONIES/mL PROTEUS MIRABILIS  Blood culture (routine x 2)     Status: None (Preliminary result)   Collection Time: 11/11/22  2:57 PM   Specimen: BLOOD RIGHT ARM  Result Value Ref Range Status   Specimen Description   Final    BLOOD RIGHT ARM BOTTLES DRAWN AEROBIC AND ANAEROBIC   Special Requests Blood Culture adequate volume  Final   Culture   Final    NO GROWTH 2 DAYS Performed at [facility], [address], [place], [place] [address]    Report Status PENDING  Incomplete  Blood culture (routine x 2)     Status: None (Preliminary result)   Collection Time: 11/11/22  2:57 PM   Specimen: Left Antecubital; Blood  Result Value Ref Range Status   Specimen Description   Final    LEFT ANTECUBITAL BOTTLES DRAWN AEROBIC AND ANAEROBIC   Special Requests Blood Culture adequate volume  Final   Culture   Final    NO GROWTH 2 DAYS Performed at [facility], [address] [place], [place] [address]    Report Status PENDING  Incomplete  Resp panel by RT-PCR (RSV, Flu A&B, Covid) Anterior Nasal Swab     Status: None   Collection Time: 11/11/22  4:25 PM   Specimen: Anterior Nasal Swab  Result Value Ref Range Status   SARS Coronavirus 2 by RT PCR NEGATIVE NEGATIVE Final    Comment: (NOTE) SARS-CoV-2 target nucleic acids are NOT DETECTED.  The SARS-CoV-2 RNA is generally detectable in upper respiratory specimens during the acute phase of infection. The lowest concentration of SARS-CoV-2 viral copies this assay can detect is 138 copies/mL. A negative result does not preclude SARS-Cov-2 infection and should not be used as the sole basis for treatment or other patient management decisions. A negative result may occur with  improper specimen collection/handling, submission of specimen other than nasopharyngeal swab, presence of viral mutation(s) within the areas targeted by this assay, and inadequate number of viral copies(<138 copies/mL). A negative result must be combined with clinical observations, patient history, and epidemiological information. The expected result is Negative.  Fact Sheet for Patients:  EntrepreneurPulse.com.au  Fact Sheet for Healthcare Providers:  IncredibleEmployment.be  This test is no t yet approved or cleared by the [place] FDA and  has been authorized for detection and/or diagnosis of SARS-CoV-2 by FDA under an Emergency Use Authorization (EUA). This EUA will remain  in effect (meaning this test can be used) for the duration of the COVID-19 declaration under Section 564(b)(1) of the Act, 21 U.S.C.section 360bbb-3(b)(1), unless the authorization is terminated  or revoked sooner.       Influenza A by PCR NEGATIVE NEGATIVE Final   Influenza B by PCR NEGATIVE NEGATIVE Final    Comment: (NOTE) The Xpert Xpress SARS-CoV-2/FLU/RSV plus assay is intended as an aid in the diagnosis of  influenza from Nasopharyngeal swab specimens and should not be used  as a sole basis for treatment. Nasal washings and aspirates are unacceptable for Xpert Xpress SARS-CoV-2/FLU/RSV testing.  Fact Sheet for Patients: EntrepreneurPulse.com.au  Fact Sheet for Healthcare Providers: IncredibleEmployment.be  This test is not yet approved or cleared by the [place] FDA and has been authorized for detection and/or diagnosis of SARS-CoV-2 by FDA under an Emergency Use Authorization (EUA). This EUA will remain in effect (meaning this test can be used) for the duration of the COVID-19 declaration under Section 564(b)(1) of the Act, 21 U.S.C. section 360bbb-3(b)(1), unless the authorization is terminated or revoked.     Resp Syncytial Virus by PCR NEGATIVE NEGATIVE Final    Comment: (NOTE) Fact Sheet for Patients: EntrepreneurPulse.com.au  Fact Sheet for Healthcare Providers: IncredibleEmployment.be  This test is not yet approved or cleared by the [place] FDA and has been authorized for detection and/or diagnosis of SARS-CoV-2 by FDA under an Emergency Use Authorization (EUA). This EUA will remain in effect (meaning this test can be used) for the duration of the COVID-19 declaration under Section 564(b)(1) of the Act, 21 U.S.C. section 360bbb-3(b)(1), unless the authorization is terminated or revoked.  Performed at [facility], [address] [place], [place] [address]       Radiology Studies: ECHOCARDIOGRAM COMPLETE  Result Date: 11/12/2022    ECHOCARDIOGRAM REPORT   Patient Name:   Amy M Mcnerney Date of Exam: 11/12/2022 Medical Rec #:  [id number]     Height:       65.5 in Accession #:    [id number]    Weight:       157.6 lb Date of Birth:  [birth year]      BSA:          1.798 m Patient Age:    17 years      BP:           168/67 mmHg Patient Gender: F             HR:           66 bpm. Exam Location:  [facility] Procedure: 2D Echo, Color Doppler and Cardiac Doppler Indications:    S28.76 Acute systolic (congestive) heart failure  History:        Patient has prior history of Echocardiogram examinations, most                 recent 12/17/2021. CAD, Pacemaker and Prior Cardiac Surgery,                 Arrythmias:Atrial Fibrillation; Risk Factors:Hypertension and                 Dyslipidemia.  Sonographer:    Raquel Sarna Senior RDCS Referring Phys: [id number] OLADAPO ADEFESO IMPRESSIONS  1. Left ventricular ejection fraction, by estimation, is 55 to 60%. The left ventricle has normal function. The left ventricle has no regional wall motion abnormalities. Left ventricular diastolic parameters are consistent with Grade I diastolic dysfunction (impaired relaxation).  2. Pacing wires in RA/RV . Right ventricular systolic function is normal. The right ventricular size is normal. There is moderately elevated pulmonary artery systolic pressure.  3. The mitral valve is degenerative. Trivial mitral valve regurgitation. No evidence of mitral stenosis.  4. Tricuspid valve regurgitation is mild to moderate.  5. Gradients have gone up since TTE done 12/17/21 LVOT diameter used on this TTE 1.9 rather than 1.9 so AVA smaller Overal with mean gradient 15 DVI 0.37 thinks this is more moderate-severe AS despite calculated AVA of 0.83  cm2 . The aortic valve is tricuspid. There is severe calcifcation of the aortic valve. There is severe thickening of the aortic valve. Aortic valve regurgitation is not visualized. Moderate to severe aortic valve stenosis.  6. The inferior vena cava is normal in size with greater than 50% respiratory variability, suggesting right atrial pressure of 3 mmHg. FINDINGS  Left Ventricle: Left ventricular ejection fraction, by estimation, is 55 to 60%. The left ventricle has normal function. The left ventricle has no regional wall motion abnormalities. The left ventricular internal cavity size was normal in size. There is  no  left ventricular hypertrophy. Left ventricular diastolic parameters are consistent with Grade I diastolic dysfunction (impaired relaxation). Right Ventricle: Pacing wires in RA/RV. The right ventricular size is normal. No increase in right ventricular wall thickness. Right ventricular systolic function is normal. There is moderately elevated pulmonary artery systolic pressure. The tricuspid regurgitant velocity is 3.35 m/s, and with an assumed right atrial pressure of 3 mmHg, the estimated right ventricular systolic pressure is 81.8 mmHg. Left Atrium: Left atrial size was normal in size. Right Atrium: Right atrial size was normal in size. Pericardium: There is no evidence of pericardial effusion. Mitral Valve: The mitral valve is degenerative in appearance. There is mild thickening of the mitral valve leaflet(s). There is mild calcification of the mitral valve leaflet(s). Mild mitral annular calcification. Trivial mitral valve regurgitation. No evidence of mitral valve stenosis. Tricuspid Valve: The tricuspid valve is normal in structure. Tricuspid valve regurgitation is mild to moderate. No evidence of tricuspid stenosis. Aortic Valve: Gradients have gone up since TTE done 12/17/21 LVOT diameter used on this TTE 1.9 rather than 1.9 so AVA smaller Overal with mean gradient 15 DVI 0.37 thinks this is more moderate-severe AS despite calculated AVA of 0.83 cm2. The aortic valve is tricuspid. There is severe calcifcation of the aortic valve. There is severe thickening of the aortic valve. Aortic valve regurgitation is not visualized. Moderate to severe aortic stenosis is present. Aortic valve mean gradient measures 15.0 mmHg. Aortic valve peak gradient measures 32.7 mmHg. Aortic valve area, by VTI measures 0.83 cm. Pulmonic Valve: The pulmonic valve was normal in structure. Pulmonic valve regurgitation is trivial. No evidence of pulmonic stenosis. Aorta: The aortic root is normal in size and structure. Venous: The  inferior vena cava is normal in size with greater than 50% respiratory variability, suggesting right atrial pressure of 3 mmHg. IAS/Shunts: No atrial level shunt detected by color flow Doppler.  LEFT VENTRICLE PLAX 2D LVIDd:         2.65 cm   Diastology LVIDs:         1.80 cm   LV e' medial:    5.55 cm/s LV PW:         0.80 cm   LV E/e' medial:  18.2 LV IVS:        1.05 cm   LV e' lateral:   7.29 cm/s LVOT diam:     1.70 cm   LV E/e' lateral: 13.9 LV SV:         44 LV SV Index:   24 LVOT Area:     2.27 cm  RIGHT VENTRICLE RV S prime:     8.81 cm/s TAPSE (M-mode): 1.9 cm LEFT ATRIUM             Index        RIGHT ATRIUM           Index LA diam:  2.90 cm 1.61 cm/m   RA Area:     18.60 cm LA Vol (A2C):   82.3 ml 45.77 ml/m  RA Volume:   51.00 ml  28.37 ml/m LA Vol (A4C):   46.5 ml 25.86 ml/m LA Biplane Vol: 65.0 ml 36.15 ml/m  AORTIC VALVE AV Area (Vmax):    0.76 cm AV Area (Vmean):   0.88 cm AV Area (VTI):     0.83 cm AV Vmax:           286.00 cm/s AV Vmean:          173.000 cm/s AV VTI:            0.530 m AV Peak Grad:      32.7 mmHg AV Mean Grad:      15.0 mmHg LVOT Vmax:         96.00 cm/s LVOT Vmean:        67.300 cm/s LVOT VTI:          0.194 m LVOT/AV VTI ratio: 0.37  AORTA Ao Root diam: 3.30 cm Ao Asc diam:  3.30 cm MITRAL VALVE                TRICUSPID VALVE MV Area (PHT): 4.15 cm     TR Peak grad:   44.9 mmHg MV Decel Time: 183 msec     TR Vmax:        335.00 cm/s MV E velocity: 101.00 cm/s MV A velocity: 105.00 cm/s  SHUNTS MV E/A ratio:  0.96         Systemic VTI:  0.19 m                             Systemic Diam: 1.70 cm Jenkins Rouge MD Electronically signed by Jenkins Rouge MD Signature Date/Time: 11/12/2022/10:56:29 AM    Final    CT Angio Chest PE W and/or Wo Contrast  Result Date: 11/11/2022 CLINICAL DATA:  Nausea and vomiting, history of UTI, hypertension EXAM: CT ANGIOGRAPHY CHEST CT ABDOMEN AND PELVIS WITH CONTRAST TECHNIQUE: Multidetector CT imaging of the chest was performed  using the standard protocol during bolus administration of intravenous contrast. Multiplanar CT image reconstructions and MIPs were obtained to evaluate the vascular anatomy. Multidetector CT imaging of the abdomen and pelvis was performed using the standard protocol during bolus administration of intravenous contrast. RADIATION DOSE REDUCTION: This exam was performed according to the departmental dose-optimization program which includes automated exposure control, adjustment of the mA and/or kV according to patient size and/or use of iterative reconstruction technique. CONTRAST:  5m OMNIPAQUE IOHEXOL 350 MG/ML SOLN COMPARISON:  09/20/2022 FINDINGS: CTA CHEST FINDINGS Cardiovascular: This is a technically adequate evaluation of the pulmonary vasculature. No filling defects or pulmonary emboli. Mild cardiomegaly without pericardial effusion. Reflux of contrast into the hepatic veins suggests underlying cardiac dysfunction. No evidence of thoracic aortic aneurysm or dissection. Atherosclerosis of the aorta and native coronary vasculature unchanged. Prior CABG. Mediastinum/Nodes: No enlarged mediastinal, hilar, or axillary lymph nodes. Thyroid gland, trachea, and esophagus demonstrate no significant findings. Lungs/Pleura: Trace bilateral pleural effusions, left greater than right. There is interlobular septal thickening, with patchy areas of ground-glass airspace disease in the upper lung zones, favor fluid overload and developing edema. No pneumothorax. There is bilateral bronchial wall thickening greatest in the left lower lobe, with partial opacification likely reflecting mucoid impaction or aspiration. Musculoskeletal: No acute or destructive bony lesions. Severe bilateral shoulder osteoarthritis. Reconstructed images  demonstrate no additional findings. Review of the MIP images confirms the above findings. CT ABDOMEN and PELVIS FINDINGS Hepatobiliary: No focal liver abnormality is seen. No gallstones,  gallbladder wall thickening, or biliary dilatation. Pancreas: Unremarkable. No pancreatic ductal dilatation or surrounding inflammatory changes. Spleen: Normal in size without focal abnormality. Adrenals/Urinary Tract: Numerous bilateral renal cortical cysts are unchanged, and do not require imaging follow-up. No urinary tract calculi or obstruction. The adrenals are unremarkable. The bladder is decompressed, limiting its evaluation. Stomach/Bowel: No bowel obstruction or ileus. Diverticulosis of the sigmoid colon without diverticulitis. Normal retrocecal appendix. No bowel wall thickening or inflammatory change. Vascular/Lymphatic: Aortic atherosclerosis. No enlarged abdominal or pelvic lymph nodes. Reproductive: Uterus and bilateral adnexa are unremarkable. Other: No free fluid or free intraperitoneal gas. Stable fat containing umbilical and supraumbilical midline ventral hernias. No bowel herniation. Musculoskeletal: Bilateral hip arthroplasties. No acute or destructive bony abnormality. Reconstructed images demonstrate no additional findings. Review of the MIP images confirms the above findings. IMPRESSION: Chest: 1. No evidence of pulmonary embolus. 2. Cardiomegaly, with reflux of contrast into the hepatic veins suggesting underlying cardiac dysfunction. 3. Trace bilateral pleural effusions, with interlobular septal thickening and scattered ground-glass airspace disease consistent with fluid overload and developing edema. 4. Bilateral bronchial wall thickening, greatest in the left lower lobe with partial airway opacification. This could reflect mucoid impaction or aspiration. 5. Aortic Atherosclerosis (ICD10-I70.0). Coronary artery atherosclerosis. Abdomen/pelvis: 1. No acute intra-abdominal or intrapelvic process. 2. Distal colonic diverticulosis without diverticulitis. 3. Stable midline fat containing ventral and umbilical hernias. 4.  Aortic Atherosclerosis (ICD10-I70.0). Electronically Signed   By: Randa Ngo M.D.   On: 11/11/2022 18:36   CT ABDOMEN PELVIS W CONTRAST  Result Date: 11/11/2022 CLINICAL DATA:  Nausea and vomiting, history of UTI, hypertension EXAM: CT ANGIOGRAPHY CHEST CT ABDOMEN AND PELVIS WITH CONTRAST TECHNIQUE: Multidetector CT imaging of the chest was performed using the standard protocol during bolus administration of intravenous contrast. Multiplanar CT image reconstructions and MIPs were obtained to evaluate the vascular anatomy. Multidetector CT imaging of the abdomen and pelvis was performed using the standard protocol during bolus administration of intravenous contrast. RADIATION DOSE REDUCTION: This exam was performed according to the departmental dose-optimization program which includes automated exposure control, adjustment of the mA and/or kV according to patient size and/or use of iterative reconstruction technique. CONTRAST:  68m OMNIPAQUE IOHEXOL 350 MG/ML SOLN COMPARISON:  09/20/2022 FINDINGS: CTA CHEST FINDINGS Cardiovascular: This is a technically adequate evaluation of the pulmonary vasculature. No filling defects or pulmonary emboli. Mild cardiomegaly without pericardial effusion. Reflux of contrast into the hepatic veins suggests underlying cardiac dysfunction. No evidence of thoracic aortic aneurysm or dissection. Atherosclerosis of the aorta and native coronary vasculature unchanged. Prior CABG. Mediastinum/Nodes: No enlarged mediastinal, hilar, or axillary lymph nodes. Thyroid gland, trachea, and esophagus demonstrate no significant findings. Lungs/Pleura: Trace bilateral pleural effusions, left greater than right. There is interlobular septal thickening, with patchy areas of ground-glass airspace disease in the upper lung zones, favor fluid overload and developing edema. No pneumothorax. There is bilateral bronchial wall thickening greatest in the left lower lobe, with partial opacification likely reflecting mucoid impaction or aspiration. Musculoskeletal: No acute or  destructive bony lesions. Severe bilateral shoulder osteoarthritis. Reconstructed images demonstrate no additional findings. Review of the MIP images confirms the above findings. CT ABDOMEN and PELVIS FINDINGS Hepatobiliary: No focal liver abnormality is seen. No gallstones, gallbladder wall thickening, or biliary dilatation. Pancreas: Unremarkable. No pancreatic ductal dilatation or surrounding inflammatory changes. Spleen: Normal in  size without focal abnormality. Adrenals/Urinary Tract: Numerous bilateral renal cortical cysts are unchanged, and do not require imaging follow-up. No urinary tract calculi or obstruction. The adrenals are unremarkable. The bladder is decompressed, limiting its evaluation. Stomach/Bowel: No bowel obstruction or ileus. Diverticulosis of the sigmoid colon without diverticulitis. Normal retrocecal appendix. No bowel wall thickening or inflammatory change. Vascular/Lymphatic: Aortic atherosclerosis. No enlarged abdominal or pelvic lymph nodes. Reproductive: Uterus and bilateral adnexa are unremarkable. Other: No free fluid or free intraperitoneal gas. Stable fat containing umbilical and supraumbilical midline ventral hernias. No bowel herniation. Musculoskeletal: Bilateral hip arthroplasties. No acute or destructive bony abnormality. Reconstructed images demonstrate no additional findings. Review of the MIP images confirms the above findings. IMPRESSION: Chest: 1. No evidence of pulmonary embolus. 2. Cardiomegaly, with reflux of contrast into the hepatic veins suggesting underlying cardiac dysfunction. 3. Trace bilateral pleural effusions, with interlobular septal thickening and scattered ground-glass airspace disease consistent with fluid overload and developing edema. 4. Bilateral bronchial wall thickening, greatest in the left lower lobe with partial airway opacification. This could reflect mucoid impaction or aspiration. 5. Aortic Atherosclerosis (ICD10-I70.0). Coronary artery  atherosclerosis. Abdomen/pelvis: 1. No acute intra-abdominal or intrapelvic process. 2. Distal colonic diverticulosis without diverticulitis. 3. Stable midline fat containing ventral and umbilical hernias. 4.  Aortic Atherosclerosis (ICD10-I70.0). Electronically Signed   By: Randa Ngo M.D.   On: 11/11/2022 18:36     Scheduled Meds:  apixaban  2.5 mg Oral BID   calcium carbonate  400 mg of elemental calcium Oral TID   ferrous sulfate  325 mg Oral QODAY   fluticasone furoate-vilanterol  1 puff Inhalation Daily   And   umeclidinium bromide  1 puff Inhalation Daily   [START ON 11/14/2022] furosemide  40 mg Intravenous Daily   levothyroxine  75 mcg Oral Q0600   metoprolol succinate  25 mg Oral Daily   mirtazapine  7.5 mg Oral QHS   pantoprazole  40 mg Oral BID   Continuous Infusions:  cefTRIAXone (ROCEPHIN)  IV 1 g (11/13/22 1211)    LOS: 2 days   Roxan Hockey M.D on 11/13/2022 at 5:27 PM  Go to [email or web address] - for contact info  [facility] - Office  [phone]  If 7PM-7AM, please contact night-coverage [email or web address] 11/13/2022, 5:27 PM

## 2022-11-14 DIAGNOSIS — J810 Acute pulmonary edema: Secondary | ICD-10-CM | POA: Diagnosis not present

## 2022-11-14 DIAGNOSIS — N390 Urinary tract infection, site not specified: Secondary | ICD-10-CM | POA: Diagnosis present

## 2022-11-14 DIAGNOSIS — J9601 Acute respiratory failure with hypoxia: Secondary | ICD-10-CM | POA: Diagnosis not present

## 2022-11-14 DIAGNOSIS — I10 Essential (primary) hypertension: Secondary | ICD-10-CM | POA: Diagnosis not present

## 2022-11-14 LAB — RENAL FUNCTION PANEL
Albumin: 2.7 g/dL — ABNORMAL LOW (ref 3.5–5.0)
Anion gap: 11 (ref 5–15)
BUN: 23 mg/dL (ref 8–23)
CO2: 29 mmol/L (ref 22–32)
Calcium: 8.6 mg/dL — ABNORMAL LOW (ref 8.9–10.3)
Chloride: 94 mmol/L — ABNORMAL LOW (ref 98–111)
Creatinine, Ser: 1.71 mg/dL — ABNORMAL HIGH (ref 0.44–1.00)
GFR, Estimated: 27 mL/min — ABNORMAL LOW (ref >60)
Glucose, Bld: 95 mg/dL (ref 70–99)
Phosphorus: 3 mg/dL (ref 2.5–4.6)
Potassium: 4.4 mmol/L (ref 3.5–5.1)
Sodium: 134 mmol/L — ABNORMAL LOW (ref 135–145)

## 2022-11-14 MED ORDER — GUAIFENESIN-DM 100-10 MG/5ML PO SYRP
5.0000 mL | ORAL_SOLUTION | ORAL | Status: DC | PRN
  Administered 2022-11-14 – 2022-11-15 (×2): 5 mL via ORAL
  Filled 2022-11-14 (×2): qty 5

## 2022-11-14 MED ORDER — ENSURE ENLIVE PO LIQD
237.0000 mL | Freq: Two times a day (BID) | ORAL | Status: DC
  Administered 2022-11-14 – 2022-11-17 (×6): 237 mL via ORAL

## 2022-11-14 MED ORDER — GABAPENTIN 100 MG PO CAPS
100.0000 mg | ORAL_CAPSULE | Freq: Two times a day (BID) | ORAL | Status: DC
  Administered 2022-11-15 – 2022-11-17 (×6): 100 mg via ORAL
  Filled 2022-11-14 (×6): qty 1

## 2022-11-14 MED ORDER — FUROSEMIDE 10 MG/ML IJ SOLN
20.0000 mg | Freq: Every day | INTRAMUSCULAR | Status: DC
  Administered 2022-11-15: 20 mg via INTRAVENOUS
  Filled 2022-11-14: qty 2

## 2022-11-14 NOTE — TOC Transition Note (Signed)
Transition of Care [facility]) - CM/SW Discharge Note   Patient Details  Name: Amy [name] MRN: [id number] Date of Birth: January 24, 1922  Transition of Care [facility]) CM/SW Contact:  Boneta Lucks, RN Phone Number: 11/14/2022, 1:02 PM   Clinical Narrative:   Patient wanting home health, no preferences. MD aware to order. Caryl Pina with [facility] accepted the referral.   ADDENDUM;  Sarah with [facility] text, patient is active with them for HHPT.  CM updated Caryl Pina with [facility].   PT has a new PT eval pending. TOC to follow for orders.    Final next level of care: Home w Home Health Services Barriers to Discharge: Continued Medical Work up   Patient Goals and CMS Choice CMS Medicare.gov Compare Post Acute Care list provided to:: Patient Choice offered to / list presented to : Patient  Discharge Placement   Discharge Plan and Services Additional resources added to the After Visit Summary for        [facility] Agency: [place] ([facility]) Date [facility] Agency Contacted: 11/14/22 Time [place]: [place] Representative spoke with at [place]: [place] ([place]) Interventions [place]: No Food Insecurity (11/13/2022)  Housing: Low Risk  (11/13/2022)  Transportation Needs: No Transportation Needs (11/13/2022)  Utilities: Not At Risk (11/13/2022)  Tobacco Use: Low Risk  (09/20/2022)    Readmission Risk Interventions    11/14/2022    1:02 PM  Readmission Risk Prevention Plan  Transportation Screening Complete  PCP or Specialist Appt within 5-7 Days Not Complete  Home Care Screening Complete  Medication Review (RN CM) Complete

## 2022-11-14 NOTE — Progress Notes (Signed)
PROGRESS NOTE     Amy [name], is a 89 y.o. female, DOB - 05/29/1922, ZDG:[id number]  Admit date - 11/11/2022   Admitting Physician Bernadette Hoit, DO  Outpatient Primary MD for the patient is Practice, [facility]  LOS - 3  CC-- dyspnea      Brief Narrative:  89 y.o. female with medical history significant of asthma, paroxysmal atrial fibrillation on Eliquis, GERD, CAD s/p CABG x 3, hypertension, hypothyroidism admitted on 11/11/2021 with acute on chronic diastolic dysfunction CHF exacerbation and possible pneumonia with acute hypoxic respiratory failure    -Assessment and Plan: 1) acute on chronic diastolic dysfunction CHF exacerbation--echo on 11/12/2022 with EF of 55 to 60% with grade 1 diastolic dysfunction and moderate pulmonary hypertension, with moderate to severe aortic stenosis -Be careful not to significantly lower afterload in the setting of moderate to severe aortic stenosis -Daily weights fluid input and output monitoring 11/13/22 --Bed scale weight appears to be inconsistent and probably unreliable -Creatinine trending up.... Up to 1.6 from 1.1 on admission Please Note that it was 1.78 on 01/20/2022, and 1.92 on 02/06/2023 -Lasix adjusted   2)Moderate to severe aortic stenosis--probably contributory to #1 above -Be careful not to significantly lower afterload in the setting of moderate to severe aortic stenosis  3)GERD/Anorexia--- patient with more than 10 pound weight loss due to poor oral intake lately  Remeron for appetite stimulation -Protonix and Tums for reflux symptoms  4) acute hypoxic respiratory failure--- due to CHF, weaning of oxygen with diuresis  5)Iron deficiency anemia Continue ferrous sulfate   6)CKD stage IIIb -Creatinine trending up.... Up to 1.6 from 1.1 on admission Please Note that it was 1.78 on 01/20/2022, and 1.92 on 02/06/2023 Renally adjust medications, avoid nephrotoxic agents/dehydration/hypotension   7)Paroxysmal atrial  fibrillation -stable Continue Eliquis Continue Toprol-XL    8)GERD Continue Protonix   9)Essential hypertension Continue Toprol-XL  Continue IV hydralazine 10 mg every 6 hours as needed   10)Acquired hypothyroidism Continue Synthroid   11)Asthma Continue Albuterol, Breo and Incruse Ellipta  Status is: Inpatient   Disposition: The patient is from: Home              Anticipated d/c is to: Home              Anticipated d/c date is: 2 days              Patient currently is not medically stable to d/c. Barriers: Not Clinically Stable-   Code Status :  -  Code Status: DNR   Family Communication:  Discussed with son Juanda Crumble,  Daughter Francie Massing and grand daughter Joellen Jersey at bedside at bedside  DVT Prophylaxis  :   - SCDs  SCDs Start: 11/12/22 0043 apixaban (ELIQUIS) tablet 2.5 mg Start: 11/11/22 2245 apixaban (ELIQUIS) tablet 2.5 mg   Lab Results  Component Value Date   PLT 226 11/12/2022    Inpatient Medications  Scheduled Meds:  apixaban  2.5 mg Oral BID   calcium carbonate  400 mg of elemental calcium Oral TID   feeding supplement  237 mL Oral BID BM   ferrous sulfate  325 mg Oral QODAY   fluticasone furoate-vilanterol  1 puff Inhalation Daily   And   umeclidinium bromide  1 puff Inhalation Daily   furosemide  40 mg Intravenous Daily   levothyroxine  75 mcg Oral Q0600   metoprolol succinate  25 mg Oral Daily   mirtazapine  7.5 mg Oral QHS   pantoprazole  40 mg  Oral BID   Continuous Infusions:  cefTRIAXone (ROCEPHIN)  IV 1 g (11/14/22 1422)   PRN Meds:.acetaminophen **OR** acetaminophen, albuterol, hydrALAZINE, ondansetron **OR** ondansetron (ZOFRAN) IV   Anti-infectives (From admission, onward)    Start     Dose/Rate Route Frequency Ordered Stop   11/12/22 1300  cefTRIAXone (ROCEPHIN) 1 g in sodium chloride 0.9 % 100 mL IVPB        1 g 200 mL/hr over 30 Minutes Intravenous Every 24 hours 11/12/22 1206         Subjective: Amy Dezeeuw today has no  fevers, no emesis,  No chest pain,    Daughter Francie Massing and grand daughter Joellen Jersey at bedside -Questions answered -Dyspnea improving -Not voiding as much as previously   Objective: Vitals:   11/14/22 0420 11/14/22 0500 11/14/22 0844 11/14/22 1218  BP: (!) 154/67   (!) 174/53  Pulse: 63   61  Resp: 20   20  Temp: 98.3 F (36.8 C)   98.5 F (36.9 C)  TempSrc: Oral     SpO2: 96%  96% 100%  Weight:  72.1 kg      Intake/Output Summary (Last 24 hours) at 11/14/2022 1955 Last data filed at 11/14/2022 1817 Gross per 24 hour  Intake 720 ml  Output 600 ml  Net 120 ml   Filed Weights   11/12/22 0500 11/13/22 0500 11/14/22 0500  Weight: 71.5 kg 74.1 kg 72.1 kg   Physical Exam  Gen:- Awake Alert,  in no apparent distress  HEENT:- [place].AT, No sclera icterus Nose- [place] 2L/min Neck-Supple Neck,No JVD,.  Lungs-fair air movement, no wheezing  CV- S1, S2 normal, regular, 3/6 SM  Abd-  +ve B.Sounds, Abd Soft, No tenderness,  No CVA tenderness Extremity/Skin:- No significant  edema, pedal pulses present  Psych-affect is appropriate, oriented x3 Neuro-Generalized weakness, no new focal deficits, no tremors  Data Reviewed: I have personally reviewed following labs and imaging studies  CBC: Recent Labs  Lab 11/11/22 1246 11/12/22 0327  WBC 9.5 9.4  NEUTROABS 7.1  --   HGB 11.2* 11.0*  HCT 37.9 36.2  MCV 85.2 84.4  PLT 237 045   Basic Metabolic Panel: Recent Labs  Lab 11/11/22 1246 11/12/22 0327 11/13/22 0433 11/14/22 0503  NA 135 135 134* 134*  K 3.9 3.6 3.2* 4.4  CL 96* 94* 92* 94*  CO2 29 29 32 29  GLUCOSE 96 93 105* 95  BUN 25* 19 24* 23  CREATININE 1.36* 1.17* 1.64* 1.71*  CALCIUM 9.1 8.7* 8.9 8.6*  MG 2.1 1.9  --   --   PHOS  --  2.7  --  3.0   GFR: Estimated Creatinine Clearance: 18.1 mL/min (A) (by C-G formula based on SCr of 1.71 mg/dL (H)). Liver Function Tests: Recent Labs  Lab 11/11/22 1457 11/12/22 0327 11/14/22 0503  AST 20 19  --   ALT 11 10  --    ALKPHOS 88 85  --   BILITOT 0.7 0.7  --   PROT 6.7 6.3*  --   ALBUMIN 3.2* 3.1* 2.7*   Recent Results (from the past 240 hour(s))  Urine Culture     Status: Abnormal   Collection Time: 11/11/22  2:30 PM   Specimen: Urine, Clean Catch  Result Value Ref Range Status   Specimen Description   Final    URINE, CLEAN CATCH Performed at [facility], [address], [place], [place] [address]    Special Requests   Final    NONE  Performed at [facility], [address] [place], [place] [address]    Culture 10,000 COLONIES/mL PROTEUS MIRABILIS (A)  Final   Report Status 11/13/2022 FINAL  Final   Organism ID, Bacteria PROTEUS MIRABILIS (A)  Final      Susceptibility   Proteus mirabilis - MIC*    AMPICILLIN <=2 SENSITIVE Sensitive     CEFAZOLIN 8 SENSITIVE Sensitive     CEFEPIME <=0.12 SENSITIVE Sensitive     CEFTRIAXONE <=0.25 SENSITIVE Sensitive     CIPROFLOXACIN >=4 RESISTANT Resistant     GENTAMICIN 8 INTERMEDIATE Intermediate     IMIPENEM 2 SENSITIVE Sensitive     NITROFURANTOIN RESISTANT Resistant     TRIMETH/SULFA >=320 RESISTANT Resistant     AMPICILLIN/SULBACTAM <=2 SENSITIVE Sensitive     PIP/TAZO <=4 SENSITIVE Sensitive     * 10,000 COLONIES/mL PROTEUS MIRABILIS  Blood culture (routine x 2)     Status: None (Preliminary result)   Collection Time: 11/11/22  2:57 PM   Specimen: BLOOD RIGHT ARM  Result Value Ref Range Status   Specimen Description   Final    BLOOD RIGHT ARM BOTTLES DRAWN AEROBIC AND ANAEROBIC   Special Requests Blood Culture adequate volume  Final   Culture   Final    NO GROWTH 3 DAYS Performed at [facility], [address], [place], [place] [address]    Report Status PENDING  Incomplete  Blood culture (routine x 2)     Status: None (Preliminary result)   Collection Time: 11/11/22  2:57 PM   Specimen: Left Antecubital; Blood  Result Value Ref Range Status   Specimen Description   Final    LEFT ANTECUBITAL BOTTLES DRAWN AEROBIC AND ANAEROBIC    Special Requests Blood Culture adequate volume  Final   Culture   Final    NO GROWTH 3 DAYS Performed at [facility], [address] [place],  [address]    Report Status PENDING  Incomplete  Resp panel by RT-PCR (RSV, Flu A&B, Covid) Anterior Nasal Swab     Status: None   Collection Time: 11/11/22  4:25 PM   Specimen: Anterior Nasal Swab  Result Value Ref Range Status   SARS Coronavirus 2 by RT PCR NEGATIVE NEGATIVE Final    Comment: (NOTE) SARS-CoV-2 target nucleic acids are NOT DETECTED.  The SARS-CoV-2 RNA is generally detectable in upper respiratory specimens during the acute phase of infection. The lowest concentration of SARS-CoV-2 viral copies this assay can detect is 138 copies/mL. A negative result does not preclude SARS-Cov-2 infection and should not be used as the sole basis for treatment or other patient management decisions. A negative result may occur with  improper specimen collection/handling, submission of specimen other than nasopharyngeal swab, presence of viral mutation(s) within the areas targeted by this assay, and inadequate number of viral copies(<138 copies/mL). A negative result must be combined with clinical observations, patient history, and epidemiological information. The expected result is Negative.  Fact Sheet for Patients:  EntrepreneurPulse.com.au  Fact Sheet for Healthcare Providers:  IncredibleEmployment.be  This test is no t yet approved or cleared by the [place] FDA and  has been authorized for detection and/or diagnosis of SARS-CoV-2 by FDA under an Emergency Use Authorization (EUA). This EUA will remain  in effect (meaning this test can be used) for the duration of the COVID-19 declaration under Section 564(b)(1) of the Act, 21 U.S.C.section 360bbb-3(b)(1), unless the authorization is terminated  or revoked sooner.       Influenza A  by PCR NEGATIVE NEGATIVE Final   Influenza B by PCR  NEGATIVE NEGATIVE Final    Comment: (NOTE) The Xpert Xpress SARS-CoV-2/FLU/RSV plus assay is intended as an aid in the diagnosis of influenza from Nasopharyngeal swab specimens and should not be used as a sole basis for treatment. Nasal washings and aspirates are unacceptable for Xpert Xpress SARS-CoV-2/FLU/RSV testing.  Fact Sheet for Patients: EntrepreneurPulse.com.au  Fact Sheet for Healthcare Providers: IncredibleEmployment.be  This test is not yet approved or cleared by the [place] FDA and has been authorized for detection and/or diagnosis of SARS-CoV-2 by FDA under an Emergency Use Authorization (EUA). This EUA will remain in effect (meaning this test can be used) for the duration of the COVID-19 declaration under Section 564(b)(1) of the Act, 21 U.S.C. section 360bbb-3(b)(1), unless the authorization is terminated or revoked.     Resp Syncytial Virus by PCR NEGATIVE NEGATIVE Final    Comment: (NOTE) Fact Sheet for Patients: EntrepreneurPulse.com.au  Fact Sheet for Healthcare Providers: IncredibleEmployment.be  This test is not yet approved or cleared by the [place] FDA and has been authorized for detection and/or diagnosis of SARS-CoV-2 by FDA under an Emergency Use Authorization (EUA). This EUA will remain in effect (meaning this test can be used) for the duration of the COVID-19 declaration under Section 564(b)(1) of the Act, 21 U.S.C. section 360bbb-3(b)(1), unless the authorization is terminated or revoked.  Performed at [facility], [address], [place], [place] [address]       Radiology Studies: No results found.   Scheduled Meds:  apixaban  2.5 mg Oral BID   calcium carbonate  400 mg of elemental calcium Oral TID   feeding supplement  237 mL Oral BID BM   ferrous sulfate  325 mg Oral QODAY   fluticasone furoate-vilanterol  1 puff Inhalation Daily   And    umeclidinium bromide  1 puff Inhalation Daily   furosemide  40 mg Intravenous Daily   levothyroxine  75 mcg Oral Q0600   metoprolol succinate  25 mg Oral Daily   mirtazapine  7.5 mg Oral QHS   pantoprazole  40 mg Oral BID   Continuous Infusions:  cefTRIAXone (ROCEPHIN)  IV 1 g (11/14/22 1422)    LOS: 3 days   Roxan Hockey M.D on 11/14/2022 at 7:55 PM  Go to [email or web address] - for contact info  [facility] - Office  [phone]  If 7PM-7AM, please contact night-coverage [email or web address] 11/14/2022, 7:55 PM

## 2022-11-15 DIAGNOSIS — B964 Proteus (mirabilis) (morganii) as the cause of diseases classified elsewhere: Secondary | ICD-10-CM

## 2022-11-15 DIAGNOSIS — J810 Acute pulmonary edema: Secondary | ICD-10-CM | POA: Diagnosis not present

## 2022-11-15 DIAGNOSIS — N390 Urinary tract infection, site not specified: Secondary | ICD-10-CM

## 2022-11-15 DIAGNOSIS — I10 Essential (primary) hypertension: Secondary | ICD-10-CM | POA: Diagnosis not present

## 2022-11-15 DIAGNOSIS — R7989 Other specified abnormal findings of blood chemistry: Secondary | ICD-10-CM | POA: Diagnosis not present

## 2022-11-15 LAB — BASIC METABOLIC PANEL
Anion gap: 12 (ref 5–15)
BUN: 22 mg/dL (ref 8–23)
CO2: 29 mmol/L (ref 22–32)
Calcium: 8.5 mg/dL — ABNORMAL LOW (ref 8.9–10.3)
Chloride: 90 mmol/L — ABNORMAL LOW (ref 98–111)
Creatinine, Ser: 1.64 mg/dL — ABNORMAL HIGH (ref 0.44–1.00)
GFR, Estimated: 28 mL/min — ABNORMAL LOW (ref >60)
Glucose, Bld: 145 mg/dL — ABNORMAL HIGH (ref 70–99)
Potassium: 3.9 mmol/L (ref 3.5–5.1)
Sodium: 131 mmol/L — ABNORMAL LOW (ref 135–145)

## 2022-11-15 LAB — GLUCOSE, CAPILLARY: Glucose-Capillary: 135 mg/dL — ABNORMAL HIGH (ref 70–99)

## 2022-11-15 MED ORDER — BISACODYL 10 MG RE SUPP
10.0000 mg | Freq: Once | RECTAL | Status: AC
  Administered 2022-11-15: 10 mg via RECTAL
  Filled 2022-11-15: qty 1

## 2022-11-15 MED ORDER — TUBERCULIN PPD 5 UNIT/0.1ML ID SOLN: 5.0000 [IU] | Freq: Once | INTRADERMAL | Status: DC

## 2022-11-15 MED ORDER — FUROSEMIDE 20 MG PO TABS
20.0000 mg | ORAL_TABLET | Freq: Every day | ORAL | Status: DC
  Administered 2022-11-16 – 2022-11-17 (×2): 20 mg via ORAL
  Filled 2022-11-15 (×2): qty 1

## 2022-11-15 NOTE — Evaluation (Signed)
Physical Therapy Evaluation Patient Details Name: Amy [name] MRN: [id number] DOB: 1922/02/07 Today's Date: 11/15/2022  History of Present Illness  Amy M Lineman is a 89 y.o. female with medical history significant of asthma, paroxysmal atrial fibrillation on Eliquis, GERD, CAD s/p CABG x 3, hypertension, hypothyroidism who presents to the emergency department via EMS from home due to complaints of 1 day onset of nausea and vomiting x 1 and diarrhea which started after starting antibiotics for UTI.  She complained of 2 episodes per day of watery diarrhea which has since resolved since yesterday.  She states her PCP requested for her to go to the ED for further evaluation and management.   Clinical Impression  Patient demonstrates slow labored movement for sitting up at bedside requiring use of bed rail and Min assist, limited to a few slow labored steps in room and once fatigued had difficulty advancing BLE due to weakness and had to sit to avoid loss of balance.  Patient tolerated sitting up in chair after therapy with caregiver in room - nurse aware.  Patient will benefit from continued skilled physical therapy in hospital and recommended venue below to increase strength, balance, endurance for safe ADLs and gait.          Recommendations for follow up therapy are one component of a multi-disciplinary discharge planning process, led by the attending physician.  Recommendations may be updated based on patient status, additional functional criteria and insurance authorization.  Follow Up Recommendations Home health PT      Assistance Recommended at Discharge Set up Supervision/Assistance  Patient can return home with the following  A lot of help with walking and/or transfers;A little help with bathing/dressing/bathroom;Help with stairs or ramp for entrance;Assistance with cooking/housework    Equipment Recommendations None recommended by PT  Recommendations for Other Services        Functional Status Assessment Patient has had a recent decline in their functional status and demonstrates the ability to make significant improvements in function in a reasonable and predictable amount of time.     Precautions / Restrictions Precautions Precautions: Fall Restrictions Weight Bearing Restrictions: No      Mobility  Bed Mobility Overal bed mobility: Needs Assistance Bed Mobility: Supine to Sit     Supine to sit: Min assist     General bed mobility comments: increased time, labored movement    Transfers Overall transfer level: Needs assistance Equipment used: Rolling walker (2 wheels) Transfers: Sit to/from Stand, Bed to chair/wheelchair/BSC Sit to Stand: Min assist   Step pivot transfers: Min assist       General transfer comment: slow labored movement    Ambulation/Gait Ambulation/Gait assistance: Min assist, Mod assist Gait Distance (Feet): 15 Feet Assistive device: Rolling walker (2 wheels) Gait Pattern/deviations: Decreased step length - right, Decreased step length - left, Decreased stride length, Scissoring, Knees buckling Gait velocity: slow     General Gait Details: slow labored cadence limited mostly due to fatigue and mild scissoring of legs and bucking of knees once fatigued  Stairs            Wheelchair Mobility    Modified Rankin (Stroke Patients Only)       Balance Overall balance assessment: Needs assistance Sitting-balance support: Feet supported, No upper extremity supported Sitting balance-Leahy Scale: Fair Sitting balance - Comments: fair/good seated at EOB   Standing balance support: During functional activity, Bilateral upper extremity supported Standing balance-Leahy Scale: Poor Standing balance comment: fair/poor using RW  Pertinent Vitals/Pain Pain Assessment Pain Assessment: No/denies pain    Home Living Family/patient expects to be discharged to:: Private  residence Living Arrangements: Children Available Help at Discharge: Family;Personal care attendant;Available 24 hours/day Type of Home: House Home Access: Ramped entrance       Home Layout: One level;Able to live on main level with bedroom/bathroom;Laundry or work area in basement;Full bath on main level Home Equipment: Rolling Walker (2 wheels);Rollator (4 wheels);BSC/3in1;Cane - single point;Shower seat;Grab bars - tub/shower;Wheelchair - manual      Prior Function Prior Level of Function : Needs assist       Physical Assist : Mobility (physical);ADLs (physical) Mobility (physical): Bed mobility;Transfers;Gait;Stairs   Mobility Comments: Supervised short household distances using RW ADLs Comments: assisted by family and caregivers     Hand Dominance   Dominant Hand: Right    Extremity/Trunk Assessment   Upper Extremity Assessment Upper Extremity Assessment: Generalized weakness    Lower Extremity Assessment Lower Extremity Assessment: Generalized weakness    Cervical / Trunk Assessment Cervical / Trunk Assessment: Normal  Communication   Communication: No difficulties  Cognition Arousal/Alertness: Awake/alert Behavior During Therapy: WFL for tasks assessed/performed Overall Cognitive Status: Within Functional Limits for tasks assessed                                          General Comments      Exercises     Assessment/Plan    PT Assessment Patient needs continued PT services  PT Problem List Decreased activity tolerance;Decreased balance;Decreased mobility       PT Treatment Interventions DME instruction;Gait training;Functional mobility training;Therapeutic activities;Therapeutic exercise;Balance training;Patient/family education    PT Goals (Current goals can be found in the Care Plan section)  Acute Rehab PT Goals Patient Stated Goal: return home with family and caregivers to assist PT Goal Formulation: With  patient/family Time For Goal Achievement: 11/18/22 Potential to Achieve Goals: Good    Frequency Min 3X/week     Co-evaluation               AM-PAC PT "6 Clicks" Mobility  Outcome Measure Help needed turning from your back to your side while in a flat bed without using bedrails?: A Little Help needed moving from lying on your back to sitting on the side of a flat bed without using bedrails?: A Little Help needed moving to and from a bed to a chair (including a wheelchair)?: A Little Help needed standing up from a chair using your arms (e.g., wheelchair or bedside chair)?: A Little Help needed to walk in hospital room?: A Lot Help needed climbing 3-5 steps with a railing? : A Lot 6 Click Score: 16    End of Session   Activity Tolerance: Patient tolerated treatment well;Patient limited by fatigue Patient left: in chair;with call bell/phone within reach;with family/visitor present Nurse Communication: Mobility status PT Visit Diagnosis: Unsteadiness on feet (R26.81);Other abnormalities of gait and mobility (R26.89);Muscle weakness (generalized) (M62.81)    Time: 9702-6378 PT Time Calculation (min) (ACUTE ONLY): 20 min   Charges:   PT Evaluation $PT Eval Moderate Complexity: 1 Mod PT Treatments $Therapeutic Activity: 8-22 mins        4:19 PM, 11/15/22 Lonell Grandchild, MPT Physical Therapist with [facility] [phone] office [phone] mobile phone

## 2022-11-15 NOTE — Progress Notes (Addendum)
Patient rested well during this shift. She complained of cough. Standing order for robitussin initiated. Gave PRN robitussin x2. Medication was effective. Per pt and daughter no BM since Thursday 11/10/2022.

## 2022-11-15 NOTE — Progress Notes (Signed)
PROGRESS NOTE     Amy [name], is a 89 y.o. female, DOB - 07-Jul-1922, ASN:[id number]  Admit date - 11/11/2022   Admitting Physician Bernadette Hoit, DO  Outpatient Primary MD for the patient is Practice, [facility]  LOS - 4  CC-- dyspnea       Brief Narrative:  89 y.o. female with medical history significant of asthma, paroxysmal atrial fibrillation on Eliquis, GERD, CAD s/p CABG x 3, hypertension, hypothyroidism admitted on 11/11/2021 with acute on chronic diastolic dysfunction CHF exacerbation and possible pneumonia with acute hypoxic respiratory failure  11/15/22 -Had syncopal episode on 11/15/2022 after getting Dulcolax suppository and sitting on the commode having a bowel movement suspect vasovagal episode-she recovered nicely -PT recommending home health PT Versus SNF Rehab,  family wants SNF rehab    -Assessment and Plan: 1) acute on chronic diastolic dysfunction CHF exacerbation--echo on 11/12/2022 with EF of 55 to 60% with grade 1 diastolic dysfunction and moderate pulmonary hypertension, with moderate to severe aortic stenosis -Be careful not to significantly lower afterload in the setting of moderate to severe aortic stenosis -Daily weights fluid input and output monitoring 11/15/22 --Diuresed well with IV Lasix, okay to switch from IV to oral Lasix on 11/16/2022 -Creatinine trended up with diuresis -please Note that creatinine was 1.78 on 01/20/2022, and 1.92 on 02/05/2022  2)Moderate to severe aortic stenosis--probably contributory to #1 above -Be careful not to significantly lower afterload in the setting of moderate to severe aortic stenosis -Diuresis as above #1 -Had syncopal episode on 11/15/2022 as outlined above  3)GERD/Anorexia--- patient with more than 10 pound weight loss due to poor oral intake lately  STOP Remeron 2 due concerns about oversedation -Protonix and Tums for reflux symptoms  4) acute hypoxic respiratory failure--- due to CHF and possible  pneumonia, - -weaned off oxygen as of 11/15/2022 -  5)Iron deficiency anemia Continue ferrous sulfate   6)CKD stage IIIb -Creatinine trended up with diuresis initially, appears to have stabilized at this time Please Note that creatinine was 1.78 on 01/20/2022, and 1.92 on 02/05/2022 Renally adjust medications, avoid nephrotoxic agents/dehydration/hypotension   7)Paroxysmal atrial fibrillation -stable Continue Eliquis Continue Toprol-XL    8)GERD Continue Protonix   9)Essential hypertension Continue Toprol-XL  Continue IV hydralazine 10 mg every 6 hours as needed   10)Acquired hypothyroidism Continue Synthroid   11)Possible pneumonia--imaging studies suggest possible aspiration pneumonia versus mucoid impaction- -continue Rocephin and bronchodilators and mucolytics Continue Albuterol, Breo and Incruse Ellipta -Clinically improved, -Hypoxia appears to have resolved -Dyspnea and cough has improved  12) Proteus mirabilis UTI--- continue IV Rocephin  13) presumed vasovagal syncope---Had syncopal episode on 11/15/2022 after getting Dulcolax suppository and sitting on the commode having a bowel movement suspect vasovagal episode-she recovered nicely   14)Generalized weakness and fall risk- -PT recommending home health PT Versus SNF Rehab,  family wants SNF rehab -Prior to admission patient was working to private pay for assisted living at [facility] TB testing has been requested in case patient goes to [facility]   Status is: Inpatient   Disposition: The patient is from: Home              Anticipated d/c is to: Home              Anticipated d/c date is: 2 days              Patient currently is not medically stable to d/c. Barriers: Not Clinically Stable-   Code Status :  -  Code  Status: DNR   Family Communication:  Discussed with son Juanda Crumble,  Daughter Francie Massing and grand daughter Joellen Jersey at bedside at bedside  DVT Prophylaxis  :   - SCDs  SCDs Start: 11/12/22  0043 apixaban (ELIQUIS) tablet 2.5 mg Start: 11/11/22 2245 apixaban (ELIQUIS) tablet 2.5 mg   Lab Results  Component Value Date   PLT 226 11/12/2022   Inpatient Medications  Scheduled Meds:  apixaban  2.5 mg Oral BID   calcium carbonate  400 mg of elemental calcium Oral TID   feeding supplement  237 mL Oral BID BM   ferrous sulfate  325 mg Oral QODAY   fluticasone furoate-vilanterol  1 puff Inhalation Daily   And   umeclidinium bromide  1 puff Inhalation Daily   [START ON 11/16/2022] furosemide  20 mg Oral Daily   gabapentin  100 mg Oral BID   levothyroxine  75 mcg Oral Q0600   metoprolol succinate  25 mg Oral Daily   pantoprazole  40 mg Oral BID   Continuous Infusions:  cefTRIAXone (ROCEPHIN)  IV 1 g (11/15/22 1223)   PRN Meds:.acetaminophen **OR** acetaminophen, albuterol, guaiFENesin-dextromethorphan, hydrALAZINE, ondansetron **OR** ondansetron (ZOFRAN) IV   Anti-infectives (From admission, onward)    Start     Dose/Rate Route Frequency Ordered Stop   11/12/22 1300  cefTRIAXone (ROCEPHIN) 1 g in sodium chloride 0.9 % 100 mL IVPB        1 g 200 mL/hr over 30 Minutes Intravenous Every 24 hours 11/12/22 1206         Subjective: Amy [name] today has no fevers, no emesis,  No chest pain,    Daughter Francie Massing and caregiver Maudie Mercury at bedside -Hypoxia resolved -Had syncopal episode on 11/15/2022 after getting Dulcolax suppository and sitting on the commode having a bowel movement suspect vasovagal episode-she recovered nicely   Objective: Vitals:   11/15/22 0522 11/15/22 0810 11/15/22 1311 11/15/22 1651  BP: (!) 156/52  (!) 125/57 129/64  Pulse: 61  (!) 59 87  Resp: 18  (!) 24 20  Temp: 98.5 F (36.9 C)  98.8 F (37.1 C) 97.9 F (36.6 C)  TempSrc:   Oral Oral  SpO2: 96% 97% 94% 95%  Weight: 70.6 kg       Intake/Output Summary (Last 24 hours) at 11/15/2022 1957 Last data filed at 11/15/2022 1700 Gross per 24 hour  Intake 1200 ml  Output 1200 ml  Net 0 ml    Filed Weights   11/13/22 0500 11/14/22 0500 11/15/22 0522  Weight: 74.1 kg 72.1 kg 70.6 kg   Physical Exam  Gen:- Awake Alert,  in no apparent distress  HEENT:- [place].AT, No sclera icterus Neck-Supple Neck,No JVD,.  Lungs-fair air movement, no wheezing  CV- S1, S2 normal, regular, 3/6 SM  Abd-  +ve B.Sounds, Abd Soft, No tenderness,  No CVA tenderness Extremity/Skin:- No significant  edema, pedal pulses present  Psych-affect is appropriate, oriented x3 Neuro-Generalized weakness, no new focal deficits, no tremors  Data Reviewed: I have personally reviewed following labs and imaging studies  CBC: Recent Labs  Lab 11/11/22 1246 11/12/22 0327  WBC 9.5 9.4  NEUTROABS 7.1  --   HGB 11.2* 11.0*  HCT 37.9 36.2  MCV 85.2 84.4  PLT 237 497   Basic Metabolic Panel: Recent Labs  Lab 11/11/22 1246 11/12/22 0327 11/13/22 0433 11/14/22 0503 11/15/22 0902  NA 135 135 134* 134* 131*  K 3.9 3.6 3.2* 4.4 3.9  CL 96* 94* 92* 94* 90*  CO2 29  29 32 29 29  GLUCOSE 96 93 105* 95 145*  BUN 25* 19 24* 23 22  CREATININE 1.36* 1.17* 1.64* 1.71* 1.64*  CALCIUM 9.1 8.7* 8.9 8.6* 8.5*  MG 2.1 1.9  --   --   --   PHOS  --  2.7  --  3.0  --    GFR: Estimated Creatinine Clearance: 18.7 mL/min (A) (by C-G formula based on SCr of 1.64 mg/dL (H)). Liver Function Tests: Recent Labs  Lab 11/11/22 1457 11/12/22 0327 11/14/22 0503  AST 20 19  --   ALT 11 10  --   ALKPHOS 88 85  --   BILITOT 0.7 0.7  --   PROT 6.7 6.3*  --   ALBUMIN 3.2* 3.1* 2.7*   Recent Results (from the past 240 hour(s))  Urine Culture     Status: Abnormal   Collection Time: 11/11/22  2:30 PM   Specimen: Urine, Clean Catch  Result Value Ref Range Status   Specimen Description   Final    URINE, CLEAN CATCH Performed at [facility], [address], [place], [place] [address]    Special Requests   Final    NONE Performed at [facility], [address], [place], [place] [address]    Culture 10,000 COLONIES/mL  PROTEUS MIRABILIS (A)  Final   Report Status 11/13/2022 FINAL  Final   Organism ID, Bacteria PROTEUS MIRABILIS (A)  Final      Susceptibility   Proteus mirabilis - MIC*    AMPICILLIN <=2 SENSITIVE Sensitive     CEFAZOLIN 8 SENSITIVE Sensitive     CEFEPIME <=0.12 SENSITIVE Sensitive     CEFTRIAXONE <=0.25 SENSITIVE Sensitive     CIPROFLOXACIN >=4 RESISTANT Resistant     GENTAMICIN 8 INTERMEDIATE Intermediate     IMIPENEM 2 SENSITIVE Sensitive     NITROFURANTOIN RESISTANT Resistant     TRIMETH/SULFA >=320 RESISTANT Resistant     AMPICILLIN/SULBACTAM <=2 SENSITIVE Sensitive     PIP/TAZO <=4 SENSITIVE Sensitive     * 10,000 COLONIES/mL PROTEUS MIRABILIS  Blood culture (routine x 2)     Status: None (Preliminary result)   Collection Time: 11/11/22  2:57 PM   Specimen: BLOOD RIGHT ARM  Result Value Ref Range Status   Specimen Description   Final    BLOOD RIGHT ARM BOTTLES DRAWN AEROBIC AND ANAEROBIC   Special Requests Blood Culture adequate volume  Final   Culture   Final    NO GROWTH 4 DAYS Performed at [facility], [address], [place], [place] [address]    Report Status PENDING  Incomplete  Blood culture (routine x 2)     Status: None (Preliminary result)   Collection Time: 11/11/22  2:57 PM   Specimen: Left Antecubital; Blood  Result Value Ref Range Status   Specimen Description   Final    LEFT ANTECUBITAL BOTTLES DRAWN AEROBIC AND ANAEROBIC   Special Requests Blood Culture adequate volume  Final   Culture   Final    NO GROWTH 4 DAYS Performed at [facility], [address], [place],  [address]    Report Status PENDING  Incomplete  Resp panel by RT-PCR (RSV, Flu A&B, Covid) Anterior Nasal Swab     Status: None   Collection Time: 11/11/22  4:25 PM   Specimen: Anterior Nasal Swab  Result Value Ref Range Status   SARS Coronavirus 2 by RT PCR NEGATIVE NEGATIVE Final    Comment: (NOTE) SARS-CoV-2 target nucleic acids are NOT DETECTED.  The SARS-CoV-2 RNA is  generally detectable in upper respiratory specimens during the acute phase of infection. The lowest concentration of SARS-CoV-2 viral copies this assay can detect is 138 copies/mL. A negative result does not preclude SARS-Cov-2 infection and should not be used as the sole basis for treatment or other patient management decisions. A negative result may occur with  improper specimen collection/handling, submission of specimen other than nasopharyngeal swab, presence of viral mutation(s) within the areas targeted by this assay, and inadequate number of viral copies(<138 copies/mL). A negative result must be combined with clinical observations, patient history, and epidemiological information. The expected result is Negative.  Fact Sheet for Patients:  EntrepreneurPulse.com.au  Fact Sheet for Healthcare Providers:  IncredibleEmployment.be  This test is no t yet approved or cleared by the [place] FDA and  has been authorized for detection and/or diagnosis of SARS-CoV-2 by FDA under an Emergency Use Authorization (EUA). This EUA will remain  in effect (meaning this test can be used) for the duration of the COVID-19 declaration under Section 564(b)(1) of the Act, 21 U.S.C.section 360bbb-3(b)(1), unless the authorization is terminated  or revoked sooner.       Influenza A by PCR NEGATIVE NEGATIVE Final   Influenza B by PCR NEGATIVE NEGATIVE Final    Comment: (NOTE) The Xpert Xpress SARS-CoV-2/FLU/RSV plus assay is intended as an aid in the diagnosis of influenza from Nasopharyngeal swab specimens and should not be used as a sole basis for treatment. Nasal washings and aspirates are unacceptable for Xpert Xpress SARS-CoV-2/FLU/RSV testing.  Fact Sheet for Patients: EntrepreneurPulse.com.au  Fact Sheet for Healthcare Providers: IncredibleEmployment.be  This test is not yet approved or cleared by the [place] FDA and has been authorized for detection and/or diagnosis of SARS-CoV-2 by FDA under an Emergency Use Authorization (EUA). This EUA will remain in effect (meaning this test can be used) for the duration of the COVID-19 declaration under Section 564(b)(1) of the Act, 21 U.S.C. section 360bbb-3(b)(1), unless the authorization is terminated or revoked.     Resp Syncytial Virus by PCR NEGATIVE NEGATIVE Final    Comment: (NOTE) Fact Sheet for Patients: EntrepreneurPulse.com.au  Fact Sheet for Healthcare Providers: IncredibleEmployment.be  This test is not yet approved or cleared by the [place] FDA and has been authorized for detection and/or diagnosis of SARS-CoV-2 by FDA under an Emergency Use Authorization (EUA). This EUA will remain in effect (meaning this test can be used) for the duration of the COVID-19 declaration under Section 564(b)(1) of the Act, 21 U.S.C. section 360bbb-3(b)(1), unless the authorization is terminated or revoked.  Performed at [facility], [address], [place], [place] [address]       Radiology Studies: No results found.   Scheduled Meds:  apixaban  2.5 mg Oral BID   calcium carbonate  400 mg of elemental calcium Oral TID   feeding supplement  237 mL Oral BID BM   ferrous sulfate  325 mg Oral QODAY   fluticasone furoate-vilanterol  1 puff Inhalation Daily   And   umeclidinium bromide  1 puff Inhalation Daily   [START ON 11/16/2022] furosemide  20 mg Oral Daily   gabapentin  100 mg Oral BID   levothyroxine  75 mcg Oral Q0600   metoprolol succinate  25 mg Oral Daily   pantoprazole  40 mg Oral BID   Continuous Infusions:  cefTRIAXone (ROCEPHIN)  IV 1 g (11/15/22 1223)    LOS: 4 days   Roxan Hockey M.D on 11/15/2022  at 7:57 PM  Go to [email or web address] - for contact info  [facility] - Office  [phone]  If 7PM-7AM, please contact night-coverage [email or web address] 11/15/2022, 7:57 PM

## 2022-11-15 NOTE — Care Management Important Message (Signed)
Important Message  Patient Details  Name: Amy [name] MRN: [id number] Date of Birth: Mar 29, 1922   Medicare Important Message Given:  N/A - LOS <3 / Initial given by admissions     Tommy Medal 11/15/2022, 11:59 AM

## 2022-11-15 NOTE — Plan of Care (Signed)
  Problem: Acute Rehab PT Goals(only PT should resolve) Goal: Pt Will Go Supine/Side To Sit Outcome: Progressing Flowsheets (Taken 11/15/2022 1620) Pt will go Supine/Side to Sit:  with modified independence  with supervision Goal: Patient Will Transfer Sit To/From Stand Outcome: Progressing Flowsheets (Taken 11/15/2022 1620) Patient will transfer sit to/from stand: with supervision Goal: Pt Will Transfer Bed To Chair/Chair To Bed Outcome: Progressing Flowsheets (Taken 11/15/2022 1620) Pt will Transfer Bed to Chair/Chair to Bed: with supervision Goal: Pt Will Ambulate Outcome: Progressing Flowsheets (Taken 11/15/2022 1620) Pt will Ambulate:  25 feet  with min guard assist  with minimal assist  with rolling walker   4:20 PM, 11/15/22 Lonell Grandchild, MPT Physical Therapist with [facility] [phone] office [phone] mobile phone

## 2022-11-16 DIAGNOSIS — J810 Acute pulmonary edema: Secondary | ICD-10-CM | POA: Diagnosis not present

## 2022-11-16 DIAGNOSIS — N1832 Chronic kidney disease, stage 3b: Secondary | ICD-10-CM | POA: Diagnosis not present

## 2022-11-16 DIAGNOSIS — I480 Paroxysmal atrial fibrillation: Secondary | ICD-10-CM | POA: Diagnosis not present

## 2022-11-16 DIAGNOSIS — J9601 Acute respiratory failure with hypoxia: Secondary | ICD-10-CM | POA: Diagnosis not present

## 2022-11-16 LAB — BASIC METABOLIC PANEL
Anion gap: 10 (ref 5–15)
BUN: 26 mg/dL — ABNORMAL HIGH (ref 8–23)
CO2: 33 mmol/L — ABNORMAL HIGH (ref 22–32)
Calcium: 9 mg/dL (ref 8.9–10.3)
Chloride: 91 mmol/L — ABNORMAL LOW (ref 98–111)
Creatinine, Ser: 1.75 mg/dL — ABNORMAL HIGH (ref 0.44–1.00)
GFR, Estimated: 26 mL/min — ABNORMAL LOW (ref >60)
Glucose, Bld: 123 mg/dL — ABNORMAL HIGH (ref 70–99)
Potassium: 4 mmol/L (ref 3.5–5.1)
Sodium: 134 mmol/L — ABNORMAL LOW (ref 135–145)

## 2022-11-16 LAB — CULTURE, BLOOD (ROUTINE X 2)
Culture: NO GROWTH
Culture: NO GROWTH
Special Requests: ADEQUATE
Special Requests: ADEQUATE

## 2022-11-16 LAB — CBC
HCT: 33.8 % — ABNORMAL LOW (ref 36.0–46.0)
Hemoglobin: 10.2 g/dL — ABNORMAL LOW (ref 12.0–15.0)
MCH: 25.7 pg — ABNORMAL LOW (ref 26.0–34.0)
MCHC: 30.2 g/dL (ref 30.0–36.0)
MCV: 85.1 fL (ref 80.0–100.0)
Platelets: 268 10*3/uL (ref 150–400)
RBC: 3.97 MIL/uL (ref 3.87–5.11)
RDW: 16.3 % — ABNORMAL HIGH (ref 11.5–15.5)
WBC: 7.4 10*3/uL (ref 4.0–10.5)
nRBC: 0 % (ref 0.0–0.2)

## 2022-11-16 LAB — MAGNESIUM: Magnesium: 2.1 mg/dL (ref 1.7–2.4)

## 2022-11-16 MED ORDER — POLYETHYLENE GLYCOL 3350 17 G PO PACK
17.0000 g | PACK | Freq: Every day | ORAL | Status: DC
  Administered 2022-11-16 – 2022-11-17 (×2): 17 g via ORAL
  Filled 2022-11-16 (×2): qty 1

## 2022-11-16 MED ORDER — BISACODYL 5 MG PO TBEC: 5.0000 mg | DELAYED_RELEASE_TABLET | Freq: Every day | ORAL | Status: DC | PRN

## 2022-11-16 NOTE — Progress Notes (Signed)
Patient slept through the night no complaint of pain. Continued to monitor.

## 2022-11-16 NOTE — TOC Progression Note (Signed)
Transition of Care [facility]) - Progression Note    Patient Details  Name: Amy [name] MRN: [id number] Date of Birth: 08-23-1922  [facility] CM/SW Contact  Boneta Lucks, RN Phone Number: 11/16/2022, 9:42 AM  Clinical Narrative:   TOC consulted after rapid response late yesterday to have [facility] assess. CM called this morning, they had planned to assess this morning. After MD talked with daughter this morning she is now wanting to go home with home health.  CM spoke with daughter, she is tearful and feels like she take her home. CM has schedule [facility] to come to assess. That was cancelled. They will follow up with family after discharging. TOC to follow.    Expected Discharge Plan: [place] Barriers to Discharge: Continued Medical Work up  Expected Discharge Plan and [place]: [place] ([place]) Date [place]: 11/14/22 Time [place]: [place] Representative spoke with at [place]: [place] ([place]) Interventions [place]: No Food Insecurity (11/13/2022)  Housing: Low Risk  (11/13/2022)  Transportation Needs: No Transportation Needs (11/13/2022)  Utilities: Not At Risk (11/13/2022)  Tobacco Use: Low Risk  (09/20/2022)    Readmission Risk Interventions    11/14/2022    1:02 PM  Readmission Risk Prevention Plan  Transportation Screening Complete  PCP or Specialist Appt within 5-7 Days Not Complete  Home Care Screening Complete  Medication Review (RN CM) Complete

## 2022-11-16 NOTE — Hospital Course (Signed)
89 y.o. female with medical history significant of asthma, paroxysmal atrial fibrillation on Eliquis, GERD, CAD s/p CABG x 3, hypertension, hypothyroidism who presents to the emergency department via EMS from home due to complaints of 1 day onset of nausea and vomiting x 1 and diarrhea which started after starting antibiotics for UTI.  She complained of 2 episodes per day of watery diarrhea which has since resolved since yesterday.  She states her PCP requested for her to go to the ED for further evaluation and management.   ED Course:  In the emergency department, she was intermittently tachypneic, BP was 164/79, O2 sat was in the 80s on arrival to the ED, supplemental oxygen via [place] at 2 LPM was provided with improvement of O2 sat to 91-100%.  Other vital signs were within normal range.  Workup in the ED showed normocytic anemia, BMP was normal except for chloride of 96 and BUN/creatinine 85/1.36 (creatinine is within baseline range).  Urinalysis was unimpressive for UTI.  Lipase 24, lactic acid x 2 was negative, albumin 3.2, BNP 590.  Influenza A, B, SARS coronavirus 2, RSV was negative.  Blood culture showed no growth in less than 12 hours. CT angiography of chest showed no evidence of pulmonary embolus, but showed cardiomegaly and bilateral pleural effusions, with interlobular septal thickening and scattered ground-glass airspace disease consistent with fluid overload and developing edema. CT abdomen pelvis showed no acute intra-abdominal or intrapelvic process Chest x-ray showed Postop chest with increasing opacity and tiny effusion at the left lung base. Possible infiltrate IV hydration of 500 mL LR was given Toprol-XL 25 mg x 1 was given.  IV Lasix 20 mg x 1 was given.  Hospitalist was asked to admit patient for further evaluation and management.

## 2022-11-16 NOTE — Progress Notes (Signed)
PROGRESS NOTE   Niger M [place]  FGH:[id number] DOB: Aug 06, 1922 DOA: 11/11/2022 PCP: [facility]   No chief complaint on file.  Level of care: Telemetry  Brief Admission History:  89 y.o. female with medical history significant of asthma, paroxysmal atrial fibrillation on Eliquis, GERD, CAD s/p CABG x 3, hypertension, hypothyroidism who presents to the emergency department via EMS from home due to complaints of 1 day onset of nausea and vomiting x 1 and diarrhea which started after starting antibiotics for UTI.  She complained of 2 episodes per day of watery diarrhea which has since resolved since yesterday.  She states her PCP requested for her to go to the ED for further evaluation and management.   ED Course:  In the emergency department, she was intermittently tachypneic, BP was 164/79, O2 sat was in the 80s on arrival to the ED, supplemental oxygen via [place] at 2 LPM was provided with improvement of O2 sat to 91-100%.  Other vital signs were within normal range.  Workup in the ED showed normocytic anemia, BMP was normal except for chloride of 96 and BUN/creatinine 85/1.36 (creatinine is within baseline range).  Urinalysis was unimpressive for UTI.  Lipase 24, lactic acid x 2 was negative, albumin 3.2, BNP 590.  Influenza A, B, SARS coronavirus 2, RSV was negative.  Blood culture showed no growth in less than 12 hours. CT angiography of chest showed no evidence of pulmonary embolus, but showed cardiomegaly and bilateral pleural effusions, with interlobular septal thickening and scattered ground-glass airspace disease consistent with fluid overload and developing edema. CT abdomen pelvis showed no acute intra-abdominal or intrapelvic process Chest x-ray showed Postop chest with increasing opacity and tiny effusion at the left lung base. Possible infiltrate IV hydration of 500 mL LR was given Toprol-XL 25 mg x 1 was given.  IV Lasix 20 mg x 1 was given.  Hospitalist was asked to admit  patient for further evaluation and management.   Assessment and Plan:  acute on chronic diastolic dysfunction CHF exacerbation--echo on 11/12/2022 with EF of 55 to 60% with grade 1 diastolic dysfunction and moderate pulmonary hypertension, with moderate to severe aortic stenosis -Be careful not to significantly lower afterload in the setting of moderate to severe aortic stenosis -Daily weights fluid input and output monitoring --Diuresed well with IV Lasix, switched from IV to oral Lasix on 11/16/2022 -Creatinine trended up with diuresis -please Note that creatinine was 1.78 on 01/20/2022, and 1.92 on 02/05/2022   Moderate to severe aortic stenosis--probably contributory to #1 above -Be careful not to significantly lower afterload in the setting of moderate to severe aortic stenosis -Diuresis as above #1 -Had syncopal episode on 11/15/2022 as outlined above   GERD/Anorexia--- patient with more than 10 pound weight loss due to poor oral intake lately  STOP Remeron 2 due concerns about oversedation -Protonix and Tums for reflux symptoms   acute hypoxic respiratory failure--- due to CHF and possible pneumonia, - -weaned off oxygen as of 11/15/2022   Iron deficiency anemia Continue ferrous sulfate   CKD stage IIIb -Creatinine trended up with diuresis initially, appears to have stabilized at this time Please Note that creatinine was 1.78 on 01/20/2022, and 1.92 on 02/05/2022 Renally adjust medications, avoid nephrotoxic agents/dehydration/hypotension   Paroxysmal atrial fibrillation -stable Continue Eliquis Continue Toprol-XL    GERD Continue Protonix   Essential hypertension Continue Toprol-XL  Continue IV hydralazine 10 mg every 6 hours as needed   Acquired hypothyroidism Continue Synthroid   Possible  pneumonia--imaging studies suggest possible aspiration pneumonia versus mucus plug -continue Rocephin and bronchodilators and mucolytics Continue Albuterol, Breo and Incruse  Ellipta -Clinically improved, -Hypoxia appears to have resolved -Dyspnea and cough has improved   Proteus mirabilis UTI--- continue IV Rocephin   presumed vasovagal syncope---Had syncopal episode on 11/15/2022 after getting Dulcolax suppository and sitting on the commode having a bowel movement suspect vasovagal episode-she recovered nicely    Generalized weakness and fall risk- -PT recommending home health PT Versus SNF Rehab,  family wants SNF rehab -Prior to admission patient was working to private pay for assisted living at [facility] TB testing has been requested in case patient goes to [facility] however, now family saying they will care for her at home with home health  DVT prophylaxis: SCDs Code Status: DNR Family Communication:  Disposition: Status is: Inpatient Remains inpatient appropriate because: IV treatments    Consultants:   Procedures:   Antimicrobials:    Subjective: Pt insists on going home versus rehabilitation.  Overall feeling better.   Objective: Vitals:   11/15/22 2014 11/16/22 0606 11/16/22 0716 11/16/22 0804  BP: 138/64 (!) 153/54  (!) 148/67  Pulse: 62 64  60  Resp: 18 20    Temp: 98.9 F (37.2 C) (!) 97.4 F (36.3 C)    TempSrc:  Oral    SpO2: 94% 95% 96%   Weight:  72.6 kg      Intake/Output Summary (Last 24 hours) at 11/16/2022 1413 Last data filed at 11/16/2022 0800 Gross per 24 hour  Intake 440 ml  Output 550 ml  Net -110 ml   Filed Weights   11/14/22 0500 11/15/22 0522 11/16/22 0606  Weight: 72.1 kg 70.6 kg 72.6 kg   Examination:  General exam: frail, elderly female, Appears calm and comfortable  Respiratory system: Clear to auscultation. Respiratory effort normal. Cardiovascular system: normal S1 & S2 heard. No JVD, murmurs, rubs, gallops or clicks. No pedal edema. Gastrointestinal system: Abdomen is nondistended, soft and nontender. No organomegaly or masses felt. Normal bowel sounds heard. Central nervous system:  Alert and oriented. No focal neurological deficits. Extremities: Symmetric 5 x 5 power. Skin: No rashes, lesions or ulcers. Psychiatry: Judgement and insight appear normal. Mood & affect appropriate.   Data Reviewed: I have personally reviewed following labs and imaging studies  CBC: Recent Labs  Lab 11/11/22 1246 11/12/22 0327 11/16/22 0330  WBC 9.5 9.4 7.4  NEUTROABS 7.1  --   --   HGB 11.2* 11.0* 10.2*  HCT 37.9 36.2 33.8*  MCV 85.2 84.4 85.1  PLT 237 226 175    Basic Metabolic Panel: Recent Labs  Lab 11/11/22 1246 11/12/22 0327 11/13/22 0433 11/14/22 0503 11/15/22 0902 11/16/22 0330  NA 135 135 134* 134* 131* 134*  K 3.9 3.6 3.2* 4.4 3.9 4.0  CL 96* 94* 92* 94* 90* 91*  CO2 29 29 32 29 29 33*  GLUCOSE 96 93 105* 95 145* 123*  BUN 25* 19 24* 23 22 26*  CREATININE 1.36* 1.17* 1.64* 1.71* 1.64* 1.75*  CALCIUM 9.1 8.7* 8.9 8.6* 8.5* 9.0  MG 2.1 1.9  --   --   --  2.1  PHOS  --  2.7  --  3.0  --   --     CBG: Recent Labs  Lab 11/15/22 1651  GLUCAP 135*    Recent Results (from the past 240 hour(s))  Urine Culture     Status: Abnormal   Collection Time: 11/11/22  2:30 PM  Specimen: Urine, Clean Catch  Result Value Ref Range Status   Specimen Description   Final    URINE, CLEAN CATCH Performed at [facility], [address], [place], [place] [address]    Special Requests   Final    NONE Performed at [facility], [address] [place], [place] [address]    Culture 10,000 COLONIES/mL PROTEUS MIRABILIS (A)  Final   Report Status 11/13/2022 FINAL  Final   Organism ID, Bacteria PROTEUS MIRABILIS (A)  Final      Susceptibility   Proteus mirabilis - MIC*    AMPICILLIN <=2 SENSITIVE Sensitive     CEFAZOLIN 8 SENSITIVE Sensitive     CEFEPIME <=0.12 SENSITIVE Sensitive     CEFTRIAXONE <=0.25 SENSITIVE Sensitive     CIPROFLOXACIN >=4 RESISTANT Resistant     GENTAMICIN 8 INTERMEDIATE Intermediate     IMIPENEM 2 SENSITIVE Sensitive     NITROFURANTOIN  RESISTANT Resistant     TRIMETH/SULFA >=320 RESISTANT Resistant     AMPICILLIN/SULBACTAM <=2 SENSITIVE Sensitive     PIP/TAZO <=4 SENSITIVE Sensitive     * 10,000 COLONIES/mL PROTEUS MIRABILIS  Blood culture (routine x 2)     Status: None   Collection Time: 11/11/22  2:57 PM   Specimen: BLOOD RIGHT ARM  Result Value Ref Range Status   Specimen Description   Final    BLOOD RIGHT ARM BOTTLES DRAWN AEROBIC AND ANAEROBIC   Special Requests Blood Culture adequate volume  Final   Culture   Final    NO GROWTH 5 DAYS Performed at [facility], [address] [place], [place] [address]    Report Status 11/16/2022 FINAL  Final  Blood culture (routine x 2)     Status: None   Collection Time: 11/11/22  2:57 PM   Specimen: Left Antecubital; Blood  Result Value Ref Range Status   Specimen Description   Final    LEFT ANTECUBITAL BOTTLES DRAWN AEROBIC AND ANAEROBIC   Special Requests Blood Culture adequate volume  Final   Culture   Final    NO GROWTH 5 DAYS Performed at [facility], [address] [place], [place] [address]    Report Status 11/16/2022 FINAL  Final  Resp panel by RT-PCR (RSV, Flu A&B, Covid) Anterior Nasal Swab     Status: None   Collection Time: 11/11/22  4:25 PM   Specimen: Anterior Nasal Swab  Result Value Ref Range Status   SARS Coronavirus 2 by RT PCR NEGATIVE NEGATIVE Final    Comment: (NOTE) SARS-CoV-2 target nucleic acids are NOT DETECTED.  The SARS-CoV-2 RNA is generally detectable in upper respiratory specimens during the acute phase of infection. The lowest concentration of SARS-CoV-2 viral copies this assay can detect is 138 copies/mL. A negative result does not preclude SARS-Cov-2 infection and should not be used as the sole basis for treatment or other patient management decisions. A negative result may occur with  improper specimen collection/handling, submission of specimen other than nasopharyngeal swab, presence of viral mutation(s) within the areas  targeted by this assay, and inadequate number of viral copies(<138 copies/mL). A negative result must be combined with clinical observations, patient history, and epidemiological information. The expected result is Negative.  Fact Sheet for Patients:  EntrepreneurPulse.com.au  Fact Sheet for Healthcare Providers:  IncredibleEmployment.be  This test is no t yet approved or cleared by the [place] FDA and  has been authorized for detection and/or diagnosis of SARS-CoV-2 by FDA under an Emergency Use Authorization (EUA). This  EUA will remain  in effect (meaning this test can be used) for the duration of the COVID-19 declaration under Section 564(b)(1) of the Act, 21 U.S.C.section 360bbb-3(b)(1), unless the authorization is terminated  or revoked sooner.       Influenza A by PCR NEGATIVE NEGATIVE Final   Influenza B by PCR NEGATIVE NEGATIVE Final    Comment: (NOTE) The Xpert Xpress SARS-CoV-2/FLU/RSV plus assay is intended as an aid in the diagnosis of influenza from Nasopharyngeal swab specimens and should not be used as a sole basis for treatment. Nasal washings and aspirates are unacceptable for Xpert Xpress SARS-CoV-2/FLU/RSV testing.  Fact Sheet for Patients: EntrepreneurPulse.com.au  Fact Sheet for Healthcare Providers: IncredibleEmployment.be  This test is not yet approved or cleared by the [place] FDA and has been authorized for detection and/or diagnosis of SARS-CoV-2 by FDA under an Emergency Use Authorization (EUA). This EUA will remain in effect (meaning this test can be used) for the duration of the COVID-19 declaration under Section 564(b)(1) of the Act, 21 U.S.C. section 360bbb-3(b)(1), unless the authorization is terminated or revoked.     Resp Syncytial Virus by PCR NEGATIVE NEGATIVE Final    Comment: (NOTE) Fact Sheet for  Patients: EntrepreneurPulse.com.au  Fact Sheet for Healthcare Providers: IncredibleEmployment.be  This test is not yet approved or cleared by the [place] FDA and has been authorized for detection and/or diagnosis of SARS-CoV-2 by FDA under an Emergency Use Authorization (EUA). This EUA will remain in effect (meaning this test can be used) for the duration of the COVID-19 declaration under Section 564(b)(1) of the Act, 21 U.S.C. section 360bbb-3(b)(1), unless the authorization is terminated or revoked.  Performed at [facility], [address] [place], [place] [address]      Radiology Studies: No results found.  Scheduled Meds:  apixaban  2.5 mg Oral BID   calcium carbonate  400 mg of elemental calcium Oral TID   feeding supplement  237 mL Oral BID BM   ferrous sulfate  325 mg Oral QODAY   fluticasone furoate-vilanterol  1 puff Inhalation Daily   And   umeclidinium bromide  1 puff Inhalation Daily   furosemide  20 mg Oral Daily   gabapentin  100 mg Oral BID   levothyroxine  75 mcg Oral Q0600   metoprolol succinate  25 mg Oral Daily   pantoprazole  40 mg Oral BID   polyethylene glycol  17 g Oral Daily   Continuous Infusions:  cefTRIAXone (ROCEPHIN)  IV 1 g (11/16/22 1311)     LOS: 5 days   Time spent: 37 mins  Annalei Friesz Wynetta Emery, MD How to contact the [facility] Attending or Consulting provider [place] or covering provider during after hours [place], for this patient?  Check the care team in [facility] and look for a) attending/consulting TRH provider listed and b) the [facility] team listed Log into [email or web address] and use [place]'s universal password to access. If you do not have the password, please contact the hospital operator. Locate the [facility] provider you are looking for under [facility] and page to a number that you can be directly reached. If you still have difficulty reaching the provider, please page the [facility] (Director on Call) for  the Hospitalists listed on amion for assistance.  11/16/2022, 2:13 PM

## 2022-11-17 ENCOUNTER — Encounter (HOSPITAL_COMMUNITY): Payer: Self-pay | Admitting: Internal Medicine

## 2022-11-17 ENCOUNTER — Other Ambulatory Visit: Payer: Self-pay

## 2022-11-17 DIAGNOSIS — N390 Urinary tract infection, site not specified: Secondary | ICD-10-CM | POA: Diagnosis not present

## 2022-11-17 DIAGNOSIS — J810 Acute pulmonary edema: Secondary | ICD-10-CM | POA: Diagnosis not present

## 2022-11-17 DIAGNOSIS — J9601 Acute respiratory failure with hypoxia: Secondary | ICD-10-CM | POA: Diagnosis not present

## 2022-11-17 DIAGNOSIS — N1832 Chronic kidney disease, stage 3b: Secondary | ICD-10-CM | POA: Diagnosis not present

## 2022-11-17 LAB — BASIC METABOLIC PANEL
Anion gap: 12 (ref 5–15)
BUN: 25 mg/dL — ABNORMAL HIGH (ref 8–23)
CO2: 31 mmol/L (ref 22–32)
Calcium: 9.2 mg/dL (ref 8.9–10.3)
Chloride: 93 mmol/L — ABNORMAL LOW (ref 98–111)
Creatinine, Ser: 1.58 mg/dL — ABNORMAL HIGH (ref 0.44–1.00)
GFR, Estimated: 29 mL/min — ABNORMAL LOW (ref >60)
Glucose, Bld: 114 mg/dL — ABNORMAL HIGH (ref 70–99)
Potassium: 4.5 mmol/L (ref 3.5–5.1)
Sodium: 136 mmol/L (ref 135–145)

## 2022-11-17 LAB — QUANTIFERON-TB GOLD PLUS (RQFGPL)
QuantiFERON Mitogen Value: 2.34 IU/mL
QuantiFERON Nil Value: 0.02 IU/mL
QuantiFERON TB1 Ag Value: 0.03 IU/mL
QuantiFERON TB2 Ag Value: 0 IU/mL

## 2022-11-17 LAB — MAGNESIUM: Magnesium: 2 mg/dL (ref 1.7–2.4)

## 2022-11-17 LAB — QUANTIFERON-TB GOLD PLUS: QuantiFERON-TB Gold Plus: NEGATIVE

## 2022-11-17 MED ORDER — FERROUS SULFATE 325 (65 FE) MG PO TABS: 325.0000 mg | ORAL_TABLET | ORAL | 0 refills | Status: AC

## 2022-11-17 MED ORDER — NITROGLYCERIN 0.4 MG SL SUBL: 0.4000 mg | SUBLINGUAL_TABLET | SUBLINGUAL | 1 refills | Status: AC | PRN

## 2022-11-17 MED ORDER — GABAPENTIN 100 MG PO CAPS: 100.0000 mg | ORAL_CAPSULE | Freq: Two times a day (BID) | ORAL | 0 refills | Status: AC | PRN

## 2022-11-17 MED ORDER — BISACODYL 5 MG PO TBEC: 5.0000 mg | DELAYED_RELEASE_TABLET | Freq: Every day | ORAL | 0 refills | Status: DC | PRN

## 2022-11-17 MED ORDER — FUROSEMIDE 20 MG PO TABS: 20.0000 mg | ORAL_TABLET | ORAL | 2 refills | Status: AC

## 2022-11-17 NOTE — TOC Transition Note (Signed)
Transition of Care [facility]) - CM/SW Discharge Note   Patient Details  Name: Amy [name] MRN: [id number] Date of Birth: [birth year]  [facility]) CM/SW Contact:  Boneta Lucks, RN Phone Number: 11/17/2022, 12:20 PM   Clinical Narrative:   CM spoke with daughter, she is overwhelmed and still unsure of what the right discharge plan should be. She even brought up going home with hospice, but would not want her mother to know.  [facility] is also offering respite care, that sounds really good to her. I explained they would still need to assess. Daughter agreed to take her home with [facility] home health PT/RN/Aide. She will call [facility] to have them come assess. CM updated Crystal at [facility] as well. RN to [employer] home, Daughter wants EMS transport.     Final next level of care: Home w Home Health Services Barriers to Discharge: Barriers Resolved   Patient Goals and CMS Choice CMS Medicare.gov Compare Post Acute Care list provided to:: Patient Choice offered to / list presented to : Patient  Discharge Placement             Patient to be transferred to facility by: EMS Name of family member notified: Daughter Patient and family notified of of transfer: 11/17/22  Discharge Plan and Services Additional resources added to the After Visit Summary for         [facility] Agency: [place] ([place]) Date [facility] Agency Contacted: 11/14/22 Time [place]: 7412 Representative spoke with at [place]: [place] ([place]) Interventions SDOH Screenings   Food Insecurity: No Food Insecurity (11/13/2022)  Housing: Low Risk  (11/13/2022)  Transportation Needs: No Transportation Needs (11/13/2022)  Utilities: Not At Risk (11/13/2022)  Tobacco Use: Low Risk  (11/17/2022)    Readmission Risk Interventions    11/14/2022    1:02 PM  Readmission Risk Prevention Plan  Transportation Screening Complete  PCP or Specialist Appt within 5-7 Days Not  Complete  Home Care Screening Complete  Medication Review (RN CM) Complete

## 2022-11-17 NOTE — Discharge Summary (Signed)
Physician Discharge Summary  Niger M Loud HRC:[id number] DOB: Nov 29, 1921 DOA: 11/11/2022  PCP: [facility]  Admit date: 11/11/2022 Discharge date: 11/17/2022  Admitted From:  Home Disposition: Home with [facility] (Declines SNF)  Recommendations for Outpatient Follow-up:  Follow up with PCP in 1-2 weeks Please obtain BMP in 1-2 weeks Please monitor weekly weights   Home Health:  PT, RN, Aide  Discharge Condition: STABLE   CODE STATUS: DNR DIET: 2 gm sodium restricted    Brief Hospitalization Summary: Please see all hospital notes, images, labs for full details of the hospitalization. ADMISSION HPI:  89 y.o. female with medical history significant of asthma, paroxysmal atrial fibrillation on Eliquis, GERD, CAD s/p CABG x 3, hypertension, hypothyroidism who presents to the emergency department via EMS from home due to complaints of 1 day onset of nausea and vomiting x 1 and diarrhea which started after starting antibiotics for UTI.  She complained of 2 episodes per day of watery diarrhea which has since resolved since yesterday.  She states her PCP requested for her to go to the ED for further evaluation and management.   In the emergency department, she was intermittently tachypneic, BP was 164/79, O2 sat was in the 80s on arrival to the ED, supplemental oxygen via [facility] at 2 LPM was provided with improvement of O2 sat to 91-100%.  Other vital signs were within normal range.  Workup in the ED showed normocytic anemia, BMP was normal except for chloride of 96 and BUN/creatinine 85/1.36 (creatinine is within baseline range).  Urinalysis was unimpressive for UTI.  Lipase 24, lactic acid x 2 was negative, albumin 3.2, BNP 590.  Influenza A, B, SARS coronavirus 2, RSV was negative.  Blood culture showed no growth in less than 12 hours.  CT angiography of chest showed no evidence of pulmonary embolus, but showed cardiomegaly and bilateral pleural effusions, with interlobular septal thickening and  scattered ground-glass airspace disease consistent with fluid overload and developing edema.  CT abdomen pelvis showed no acute intra-abdominal or intrapelvic process Chest x-ray showed Postop chest with increasing opacity and tiny effusion at the left lung base. Possible infiltrate IV hydration of 500 mL LR was given Toprol-XL 25 mg x 1 was given.  IV Lasix 20 mg x 1 was given.  Hospitalist was asked to admit patient for further evaluation and management.   HOSPITAL COURSE BY PROBLEM   acute on chronic diastolic dysfunction CHF exacerbation--echo on 11/12/2022 with EF of 55 to 60% with grade 1 diastolic dysfunction and moderate pulmonary hypertension, with moderate to severe aortic stenosis  --Diuresed well with IV Lasix, switched from IV to oral Lasix on 11/16/2022 -Creatinine has stabilized with diuresis, down to 1.58 on day of discharge -please Note that creatinine was 1.78 on 01/20/2022, and 1.92 on 02/05/2022   Moderate to severe aortic stenosis- -Be careful not to significantly lower afterload in the setting of moderate to severe aortic stenosis -Diuresis as above #1 -Had syncopal episode on 11/15/2022 as outlined above   GERD/Anorexia--- patient with more than 10 pound weight loss due to poor oral intake lately  STOP Remeron 2 due concerns about oversedation -Protonix and Tums for reflux symptoms   acute hypoxic respiratory failure--- due to CHF and possible pneumonia, - -weaned off oxygen as of 11/15/2022 -treated and resolved now   Iron deficiency anemia Continue ferrous sulfate   CKD stage IIIb -Creatinine trended up with diuresis initially, appears to have stabilized at this time Please Note that creatinine was 1.78  on 01/20/2022, and 1.92 on 02/05/2022 Renally adjust medications, avoid nephrotoxic agents/dehydration/hypotension Lab Results  Component Value Date   CREATININE 1.58 (H) 11/17/2022   CREATININE 1.75 (H) 11/16/2022   CREATININE 1.64 (H) 11/15/2022    Paroxysmal  atrial fibrillation -stable Continue Eliquis Continue Toprol-XL    GERD Continue Protonix   Essential hypertension Continue Toprol-XL    Acquired hypothyroidism Continue Synthroid   Possible pneumonia--imaging studies suggest possible aspiration pneumonia versus mucus plug -treated with Rocephin and bronchodilators and mucolytics Continue Albuterol, Breo and Incruse Ellipta -Clinically improved -Hypoxia appears to have resolved -Dyspnea and cough has improved   Proteus mirabilis UTI--- treated with IV Rocephin   presumed vasovagal syncope---Had syncopal episode on 11/15/2022 after getting Dulcolax suppository and sitting on the commode having a bowel movement suspect vasovagal episode-she recovered nicely    Generalized weakness and fall risk- -PT recommending home health PT Versus SNF Rehab,  family wants SNF rehab but pt refuses; family now will take home with home health and work on outpatient palliative consultation.   -Prior to admission patient was working to private pay for assisted living at [facility] TB testing has been requested in case patient goes to [facility] however, now family saying they will care for her at home with home health   Discharge Diagnoses:  Principal Problem:   Pulmonary edema Active Problems:   Essential hypertension   Stage 3b chronic kidney disease (CKD) (HCC)   Paroxysmal atrial fibrillation ([place])   Acute respiratory failure with hypoxia (HCC)   GERD (gastroesophageal reflux disease)   SSS (sick sinus syndrome) (HCC)   Acquired hypothyroidism   Elevated brain natriuretic peptide (BNP) level   Iron deficiency anemia   Asthma, chronic   Shoulder pain   Urinary tract infection due to Proteus   Discharge Instructions:  Allergies as of 11/17/2022       Reactions   Colestipol Other (See Comments)   Muscle aches   Hydrocodone Itching   Niacin And Related Other (See Comments)   Muscle pain   Statins Other (See Comments)    Muscle aches on all she has tried.  She recalls Lipitor, Crestor, and Livalo but thinks there were others   Zetia [ezetimibe] Other (See Comments)   Muscle aches        Medication List     STOP taking these medications    cefUROXime 250 MG tablet Commonly known as: CEFTIN   Repatha SureClick 696 MG/ML Soaj Generic drug: Evolocumab       TAKE these medications    acetaminophen 500 MG tablet Commonly known as: TYLENOL Take 500 mg by mouth every 6 (six) hours as needed for moderate pain.   albuterol 108 (90 Base) MCG/ACT inhaler Commonly known as: VENTOLIN HFA Inhale 2 puffs into the lungs every 6 (six) hours as needed for wheezing or shortness of breath.   bisacodyl 5 MG EC tablet Commonly known as: DULCOLAX Take 1 tablet (5 mg total) by mouth daily as needed for moderate constipation.   Eliquis 2.5 MG Tabs tablet Generic drug: apixaban Take 1 tablet by mouth twice daily What changed: how much to take   ferrous sulfate 325 (65 FE) MG tablet Take 1 tablet (325 mg total) by mouth every other day. Start taking on: November 19, 2022   furosemide 20 MG tablet Commonly known as: LASIX Take 1 tablet (20 mg total) by mouth every other day. Start taking on: November 18, 2022 What changed:  medication strength how much to take  when to take this   gabapentin 100 MG capsule Commonly known as: NEURONTIN Take 1 capsule (100 mg total) by mouth 2 (two) times daily as needed (neuropathy pain). What changed:  when to take this reasons to take this   levothyroxine 75 MCG tablet Commonly known as: SYNTHROID Take 1 tablet by mouth daily.   metoprolol succinate 25 MG 24 hr tablet Commonly known as: TOPROL-XL Take 1 tablet by mouth once daily   nitroGLYCERIN 0.4 MG SL tablet Commonly known as: NITROSTAT Place 1 tablet (0.4 mg total) under the tongue every 5 (five) minutes x 3 doses as needed for chest pain.   omeprazole 20 MG capsule Commonly known as: PRILOSEC Take 20  mg by mouth daily.   Trelegy Ellipta 200-62.5-25 MCG/ACT Aepb Generic drug: Fluticasone-Umeclidin-Vilant Inhale 1 puff into the lungs daily.   VITAMIN D3 PO Take 1 tablet by mouth daily.        Follow-up Information     [facility]. Schedule an appointment as soon as possible for a visit in 1 week(s).   Why: Hospital Follow Up Contact information: [place] [address] [phone]                Allergies  Allergen Reactions   Colestipol Other (See Comments)    Muscle aches   Hydrocodone Itching   Niacin And Related Other (See Comments)    Muscle pain   Statins Other (See Comments)    Muscle aches on all she has tried.  She recalls Lipitor, Crestor, and Livalo but thinks there were others   Zetia [Ezetimibe] Other (See Comments)    Muscle aches   Allergies as of 11/17/2022       Reactions   Colestipol Other (See Comments)   Muscle aches   Hydrocodone Itching   Niacin And Related Other (See Comments)   Muscle pain   Statins Other (See Comments)   Muscle aches on all she has tried.  She recalls Lipitor, Crestor, and Livalo but thinks there were others   Zetia [ezetimibe] Other (See Comments)   Muscle aches        Medication List     STOP taking these medications    cefUROXime 250 MG tablet Commonly known as: CEFTIN   Repatha SureClick 099 MG/ML Soaj Generic drug: Evolocumab       TAKE these medications    acetaminophen 500 MG tablet Commonly known as: TYLENOL Take 500 mg by mouth every 6 (six) hours as needed for moderate pain.   albuterol 108 (90 Base) MCG/ACT inhaler Commonly known as: VENTOLIN HFA Inhale 2 puffs into the lungs every 6 (six) hours as needed for wheezing or shortness of breath.   bisacodyl 5 MG EC tablet Commonly known as: DULCOLAX Take 1 tablet (5 mg total) by mouth daily as needed for moderate constipation.   Eliquis 2.5 MG Tabs tablet Generic drug: apixaban Take 1 tablet by mouth twice  daily What changed: how much to take   ferrous sulfate 325 (65 FE) MG tablet Take 1 tablet (325 mg total) by mouth every other day. Start taking on: November 19, 2022   furosemide 20 MG tablet Commonly known as: LASIX Take 1 tablet (20 mg total) by mouth every other day. Start taking on: November 18, 2022 What changed:  medication strength how much to take when to take this   gabapentin 100 MG capsule Commonly known as: NEURONTIN Take 1 capsule (100 mg total) by mouth 2 (  two) times daily as needed (neuropathy pain). What changed:  when to take this reasons to take this   levothyroxine 75 MCG tablet Commonly known as: SYNTHROID Take 1 tablet by mouth daily.   metoprolol succinate 25 MG 24 hr tablet Commonly known as: TOPROL-XL Take 1 tablet by mouth once daily   nitroGLYCERIN 0.4 MG SL tablet Commonly known as: NITROSTAT Place 1 tablet (0.4 mg total) under the tongue every 5 (five) minutes x 3 doses as needed for chest pain.   omeprazole 20 MG capsule Commonly known as: PRILOSEC Take 20 mg by mouth daily.   Trelegy Ellipta 200-62.5-25 MCG/ACT Aepb Generic drug: Fluticasone-Umeclidin-Vilant Inhale 1 puff into the lungs daily.   VITAMIN D3 PO Take 1 tablet by mouth daily.        Procedures/Studies: ECHOCARDIOGRAM COMPLETE  Result Date: 11/12/2022    ECHOCARDIOGRAM REPORT   Patient Name:   Niger M Gaster Date of Exam: 11/12/2022 Medical Rec #:  [id number]     Height:       65.5 in Accession #:    [id number]    Weight:       157.6 lb Date of Birth:  03-24-1922      BSA:          1.798 m Patient Age:    89 years      BP:           168/67 mmHg Patient Gender: F             HR:           66 bpm. Exam Location:  [facility] Procedure: 2D Echo, Color Doppler and Cardiac Doppler Indications:    O70.96 Acute systolic (congestive) heart failure  History:        Patient has prior history of Echocardiogram examinations, most                 recent 12/17/2021. CAD, Pacemaker and Prior  Cardiac Surgery,                 Arrythmias:Atrial Fibrillation; Risk Factors:Hypertension and                 Dyslipidemia.  Sonographer:    Raquel Sarna Senior RDCS Referring Phys: [id number] OLADAPO ADEFESO IMPRESSIONS  1. Left ventricular ejection fraction, by estimation, is 55 to 60%. The left ventricle has normal function. The left ventricle has no regional wall motion abnormalities. Left ventricular diastolic parameters are consistent with Grade I diastolic dysfunction (impaired relaxation).  2. Pacing wires in RA/RV . Right ventricular systolic function is normal. The right ventricular size is normal. There is moderately elevated pulmonary artery systolic pressure.  3. The mitral valve is degenerative. Trivial mitral valve regurgitation. No evidence of mitral stenosis.  4. Tricuspid valve regurgitation is mild to moderate.  5. Gradients have gone up since TTE done 12/17/21 LVOT diameter used on this TTE 1.9 rather than 1.9 so AVA smaller Overal with mean gradient 15 DVI 0.37 thinks this is more moderate-severe AS despite calculated AVA of 0.83 cm2 . The aortic valve is tricuspid. There is severe calcifcation of the aortic valve. There is severe thickening of the aortic valve. Aortic valve regurgitation is not visualized. Moderate to severe aortic valve stenosis.  6. The inferior vena cava is normal in size with greater than 50% respiratory variability, suggesting right atrial pressure of 3 mmHg. FINDINGS  Left Ventricle: Left ventricular ejection fraction, by estimation, is 55 to 60%. The left ventricle has normal  function. The left ventricle has no regional wall motion abnormalities. The left ventricular internal cavity size was normal in size. There is  no left ventricular hypertrophy. Left ventricular diastolic parameters are consistent with Grade I diastolic dysfunction (impaired relaxation). Right Ventricle: Pacing wires in RA/RV. The right ventricular size is normal. No increase in right ventricular wall  thickness. Right ventricular systolic function is normal. There is moderately elevated pulmonary artery systolic pressure. The tricuspid regurgitant velocity is 3.35 m/s, and with an assumed right atrial pressure of 3 mmHg, the estimated right ventricular systolic pressure is 09.8 mmHg. Left Atrium: Left atrial size was normal in size. Right Atrium: Right atrial size was normal in size. Pericardium: There is no evidence of pericardial effusion. Mitral Valve: The mitral valve is degenerative in appearance. There is mild thickening of the mitral valve leaflet(s). There is mild calcification of the mitral valve leaflet(s). Mild mitral annular calcification. Trivial mitral valve regurgitation. No evidence of mitral valve stenosis. Tricuspid Valve: The tricuspid valve is normal in structure. Tricuspid valve regurgitation is mild to moderate. No evidence of tricuspid stenosis. Aortic Valve: Gradients have gone up since TTE done 12/17/21 LVOT diameter used on this TTE 1.9 rather than 1.9 so AVA smaller Overal with mean gradient 15 DVI 0.37 thinks this is more moderate-severe AS despite calculated AVA of 0.83 cm2. The aortic valve is tricuspid. There is severe calcifcation of the aortic valve. There is severe thickening of the aortic valve. Aortic valve regurgitation is not visualized. Moderate to severe aortic stenosis is present. Aortic valve mean gradient measures 15.0 mmHg. Aortic valve peak gradient measures 32.7 mmHg. Aortic valve area, by VTI measures 0.83 cm. Pulmonic Valve: The pulmonic valve was normal in structure. Pulmonic valve regurgitation is trivial. No evidence of pulmonic stenosis. Aorta: The aortic root is normal in size and structure. Venous: The inferior vena cava is normal in size with greater than 50% respiratory variability, suggesting right atrial pressure of 3 mmHg. IAS/Shunts: No atrial level shunt detected by color flow Doppler.  LEFT VENTRICLE PLAX 2D LVIDd:         2.65 cm   Diastology LVIDs:          1.80 cm   LV e' medial:    5.55 cm/s LV PW:         0.80 cm   LV E/e' medial:  18.2 LV IVS:        1.05 cm   LV e' lateral:   7.29 cm/s LVOT diam:     1.70 cm   LV E/e' lateral: 13.9 LV SV:         44 LV SV Index:   24 LVOT Area:     2.27 cm  RIGHT VENTRICLE RV S prime:     8.81 cm/s TAPSE (M-mode): 1.9 cm LEFT ATRIUM             Index        RIGHT ATRIUM           Index LA diam:        2.90 cm 1.61 cm/m   RA Area:     18.60 cm LA Vol (A2C):   82.3 ml 45.77 ml/m  RA Volume:   51.00 ml  28.37 ml/m LA Vol (A4C):   46.5 ml 25.86 ml/m LA Biplane Vol: 65.0 ml 36.15 ml/m  AORTIC VALVE AV Area (Vmax):    0.76 cm AV Area (Vmean):   0.88 cm AV Area (VTI):  0.83 cm AV Vmax:           286.00 cm/s AV Vmean:          173.000 cm/s AV VTI:            0.530 m AV Peak Grad:      32.7 mmHg AV Mean Grad:      15.0 mmHg LVOT Vmax:         96.00 cm/s LVOT Vmean:        67.300 cm/s LVOT VTI:          0.194 m LVOT/AV VTI ratio: 0.37  AORTA Ao Root diam: 3.30 cm Ao Asc diam:  3.30 cm MITRAL VALVE                TRICUSPID VALVE MV Area (PHT): 4.15 cm     TR Peak grad:   44.9 mmHg MV Decel Time: 183 msec     TR Vmax:        335.00 cm/s MV E velocity: 101.00 cm/s MV A velocity: 105.00 cm/s  SHUNTS MV E/A ratio:  0.96         Systemic VTI:  0.19 m                             Systemic Diam: 1.70 cm Jenkins Rouge MD Electronically signed by Jenkins Rouge MD Signature Date/Time: 11/12/2022/10:56:29 AM    Final    CT Angio Chest PE W and/or Wo Contrast  Result Date: 11/11/2022 CLINICAL DATA:  Nausea and vomiting, history of UTI, hypertension EXAM: CT ANGIOGRAPHY CHEST CT ABDOMEN AND PELVIS WITH CONTRAST TECHNIQUE: Multidetector CT imaging of the chest was performed using the standard protocol during bolus administration of intravenous contrast. Multiplanar CT image reconstructions and MIPs were obtained to evaluate the vascular anatomy. Multidetector CT imaging of the abdomen and pelvis was performed using the standard  protocol during bolus administration of intravenous contrast. RADIATION DOSE REDUCTION: This exam was performed according to the departmental dose-optimization program which includes automated exposure control, adjustment of the mA and/or kV according to patient size and/or use of iterative reconstruction technique. CONTRAST:  53m OMNIPAQUE IOHEXOL 350 MG/ML SOLN COMPARISON:  09/20/2022 FINDINGS: CTA CHEST FINDINGS Cardiovascular: This is a technically adequate evaluation of the pulmonary vasculature. No filling defects or pulmonary emboli. Mild cardiomegaly without pericardial effusion. Reflux of contrast into the hepatic veins suggests underlying cardiac dysfunction. No evidence of thoracic aortic aneurysm or dissection. Atherosclerosis of the aorta and native coronary vasculature unchanged. Prior CABG. Mediastinum/Nodes: No enlarged mediastinal, hilar, or axillary lymph nodes. Thyroid gland, trachea, and esophagus demonstrate no significant findings. Lungs/Pleura: Trace bilateral pleural effusions, left greater than right. There is interlobular septal thickening, with patchy areas of ground-glass airspace disease in the upper lung zones, favor fluid overload and developing edema. No pneumothorax. There is bilateral bronchial wall thickening greatest in the left lower lobe, with partial opacification likely reflecting mucoid impaction or aspiration. Musculoskeletal: No acute or destructive bony lesions. Severe bilateral shoulder osteoarthritis. Reconstructed images demonstrate no additional findings. Review of the MIP images confirms the above findings. CT ABDOMEN and PELVIS FINDINGS Hepatobiliary: No focal liver abnormality is seen. No gallstones, gallbladder wall thickening, or biliary dilatation. Pancreas: Unremarkable. No pancreatic ductal dilatation or surrounding inflammatory changes. Spleen: Normal in size without focal abnormality. Adrenals/Urinary Tract: Numerous bilateral renal cortical cysts are  unchanged, and do not require imaging follow-up. No urinary tract calculi or obstruction. The adrenals  are unremarkable. The bladder is decompressed, limiting its evaluation. Stomach/Bowel: No bowel obstruction or ileus. Diverticulosis of the sigmoid colon without diverticulitis. Normal retrocecal appendix. No bowel wall thickening or inflammatory change. Vascular/Lymphatic: Aortic atherosclerosis. No enlarged abdominal or pelvic lymph nodes. Reproductive: Uterus and bilateral adnexa are unremarkable. Other: No free fluid or free intraperitoneal gas. Stable fat containing umbilical and supraumbilical midline ventral hernias. No bowel herniation. Musculoskeletal: Bilateral hip arthroplasties. No acute or destructive bony abnormality. Reconstructed images demonstrate no additional findings. Review of the MIP images confirms the above findings. IMPRESSION: Chest: 1. No evidence of pulmonary embolus. 2. Cardiomegaly, with reflux of contrast into the hepatic veins suggesting underlying cardiac dysfunction. 3. Trace bilateral pleural effusions, with interlobular septal thickening and scattered ground-glass airspace disease consistent with fluid overload and developing edema. 4. Bilateral bronchial wall thickening, greatest in the left lower lobe with partial airway opacification. This could reflect mucoid impaction or aspiration. 5. Aortic Atherosclerosis (ICD10-I70.0). Coronary artery atherosclerosis. Abdomen/pelvis: 1. No acute intra-abdominal or intrapelvic process. 2. Distal colonic diverticulosis without diverticulitis. 3. Stable midline fat containing ventral and umbilical hernias. 4.  Aortic Atherosclerosis (ICD10-I70.0). Electronically Signed   By: Randa Ngo M.D.   On: 11/11/2022 18:36   CT ABDOMEN PELVIS W CONTRAST  Result Date: 11/11/2022 CLINICAL DATA:  Nausea and vomiting, history of UTI, hypertension EXAM: CT ANGIOGRAPHY CHEST CT ABDOMEN AND PELVIS WITH CONTRAST TECHNIQUE: Multidetector CT imaging of  the chest was performed using the standard protocol during bolus administration of intravenous contrast. Multiplanar CT image reconstructions and MIPs were obtained to evaluate the vascular anatomy. Multidetector CT imaging of the abdomen and pelvis was performed using the standard protocol during bolus administration of intravenous contrast. RADIATION DOSE REDUCTION: This exam was performed according to the departmental dose-optimization program which includes automated exposure control, adjustment of the mA and/or kV according to patient size and/or use of iterative reconstruction technique. CONTRAST:  21m OMNIPAQUE IOHEXOL 350 MG/ML SOLN COMPARISON:  09/20/2022 FINDINGS: CTA CHEST FINDINGS Cardiovascular: This is a technically adequate evaluation of the pulmonary vasculature. No filling defects or pulmonary emboli. Mild cardiomegaly without pericardial effusion. Reflux of contrast into the hepatic veins suggests underlying cardiac dysfunction. No evidence of thoracic aortic aneurysm or dissection. Atherosclerosis of the aorta and native coronary vasculature unchanged. Prior CABG. Mediastinum/Nodes: No enlarged mediastinal, hilar, or axillary lymph nodes. Thyroid gland, trachea, and esophagus demonstrate no significant findings. Lungs/Pleura: Trace bilateral pleural effusions, left greater than right. There is interlobular septal thickening, with patchy areas of ground-glass airspace disease in the upper lung zones, favor fluid overload and developing edema. No pneumothorax. There is bilateral bronchial wall thickening greatest in the left lower lobe, with partial opacification likely reflecting mucoid impaction or aspiration. Musculoskeletal: No acute or destructive bony lesions. Severe bilateral shoulder osteoarthritis. Reconstructed images demonstrate no additional findings. Review of the MIP images confirms the above findings. CT ABDOMEN and PELVIS FINDINGS Hepatobiliary: No focal liver abnormality is seen.  No gallstones, gallbladder wall thickening, or biliary dilatation. Pancreas: Unremarkable. No pancreatic ductal dilatation or surrounding inflammatory changes. Spleen: Normal in size without focal abnormality. Adrenals/Urinary Tract: Numerous bilateral renal cortical cysts are unchanged, and do not require imaging follow-up. No urinary tract calculi or obstruction. The adrenals are unremarkable. The bladder is decompressed, limiting its evaluation. Stomach/Bowel: No bowel obstruction or ileus. Diverticulosis of the sigmoid colon without diverticulitis. Normal retrocecal appendix. No bowel wall thickening or inflammatory change. Vascular/Lymphatic: Aortic atherosclerosis. No enlarged abdominal or pelvic lymph nodes. Reproductive: Uterus and bilateral adnexa  are unremarkable. Other: No free fluid or free intraperitoneal gas. Stable fat containing umbilical and supraumbilical midline ventral hernias. No bowel herniation. Musculoskeletal: Bilateral hip arthroplasties. No acute or destructive bony abnormality. Reconstructed images demonstrate no additional findings. Review of the MIP images confirms the above findings. IMPRESSION: Chest: 1. No evidence of pulmonary embolus. 2. Cardiomegaly, with reflux of contrast into the hepatic veins suggesting underlying cardiac dysfunction. 3. Trace bilateral pleural effusions, with interlobular septal thickening and scattered ground-glass airspace disease consistent with fluid overload and developing edema. 4. Bilateral bronchial wall thickening, greatest in the left lower lobe with partial airway opacification. This could reflect mucoid impaction or aspiration. 5. Aortic Atherosclerosis (ICD10-I70.0). Coronary artery atherosclerosis. Abdomen/pelvis: 1. No acute intra-abdominal or intrapelvic process. 2. Distal colonic diverticulosis without diverticulitis. 3. Stable midline fat containing ventral and umbilical hernias. 4.  Aortic Atherosclerosis (ICD10-I70.0). Electronically  Signed   By: Randa Ngo M.D.   On: 11/11/2022 18:36   DG Chest 2 View  Result Date: 11/11/2022 CLINICAL DATA:  Infection.  On antibiotics.  Shortness of breath EXAM: CHEST - 2 VIEW COMPARISON:  X-ray 11/24/2020.  CT 09/20/2022 FINDINGS: Left chest pacemaker. Status post median sternotomy. Calcified aorta. Increasing left lung base opacity with tiny left effusion. Diffuse interstitial changes are seen with apical pleural thickening. Stable thickening along the minor fissure as well. Degenerative changes of the spine. Advanced degenerative changes of the shoulders. IMPRESSION: Postop chest with increasing opacity and tiny effusion at the left lung base. Possible infiltrate. Recommend follow-up. Electronically Signed   By: Jill Side M.D.   On: 11/11/2022 14:24   CUP PACEART REMOTE DEVICE CHECK  Result Date: 11/10/2022 Scheduled remote reviewed. Normal device function.  Known PAF, on OAC, AF burden is 1.2% of the time.  Longest episode was 6 hours and 32 minutes. Next remote 91 days. Kathy Breach, RN, CCDS, [employer]    Subjective: Pt breathing and feeling better, no CP, no SOB symptoms, wanting to go home, declines SNF at this time   Discharge Exam: Vitals:   11/17/22 0707 11/17/22 1015  BP:    Pulse:    Resp:    Temp:    SpO2: 93% 95%   Vitals:   11/16/22 2011 11/17/22 0621 11/17/22 0707 11/17/22 1015  BP: (!) 151/62 (!) 133/57    Pulse: 63 65    Resp: 20 18    Temp: (!) 97 F (36.1 C) 98.5 F (36.9 C)    TempSrc:      SpO2: 97% 92% 93% 95%  Weight:  75.6 kg     General: Pt is alert, awake, not in acute distress Cardiovascular: normal S1/S2 +, no rubs, no gallops Respiratory: CTA bilaterally, no wheezing, no rhonchi Abdominal: Soft, NT, ND, bowel sounds + Extremities: very minimal, trace edema, no cyanosis   The results of significant diagnostics from this hospitalization (including imaging, microbiology, ancillary and laboratory) are listed below for  reference.     Microbiology: Recent Results (from the past 240 hour(s))  Urine Culture     Status: Abnormal   Collection Time: 11/11/22  2:30 PM   Specimen: Urine, Clean Catch  Result Value Ref Range Status   Specimen Description   Final    URINE, CLEAN CATCH Performed at [facility], [address] [place], [place] [address]    Special Requests   Final    NONE Performed at [facility], [address] [place], [place] [address]    Culture 10,000 [place]  MIRABILIS (A)  Final   Report Status 11/13/2022 FINAL  Final   Organism ID, Bacteria PROTEUS MIRABILIS (A)  Final      Susceptibility   Proteus mirabilis - MIC*    AMPICILLIN <=2 SENSITIVE Sensitive     CEFAZOLIN 8 SENSITIVE Sensitive     CEFEPIME <=0.12 SENSITIVE Sensitive     CEFTRIAXONE <=0.25 SENSITIVE Sensitive     CIPROFLOXACIN >=4 RESISTANT Resistant     GENTAMICIN 8 INTERMEDIATE Intermediate     IMIPENEM 2 SENSITIVE Sensitive     NITROFURANTOIN RESISTANT Resistant     TRIMETH/SULFA >=320 RESISTANT Resistant     AMPICILLIN/SULBACTAM <=2 SENSITIVE Sensitive     PIP/TAZO <=4 SENSITIVE Sensitive     * 10,000 COLONIES/mL PROTEUS MIRABILIS  Blood culture (routine x 2)     Status: None   Collection Time: 11/11/22  2:57 PM   Specimen: BLOOD RIGHT ARM  Result Value Ref Range Status   Specimen Description   Final    BLOOD RIGHT ARM BOTTLES DRAWN AEROBIC AND ANAEROBIC   Special Requests Blood Culture adequate volume  Final   Culture   Final    NO GROWTH 5 DAYS Performed at [facility], [address] [place], [place] [address]    Report Status 11/16/2022 FINAL  Final  Blood culture (routine x 2)     Status: None   Collection Time: 11/11/22  2:57 PM   Specimen: Left Antecubital; Blood  Result Value Ref Range Status   Specimen Description   Final    LEFT ANTECUBITAL BOTTLES DRAWN AEROBIC AND ANAEROBIC   Special Requests Blood Culture adequate volume  Final   Culture   Final    NO GROWTH 5 DAYS Performed  at [facility] [address], [place], [place] [address]    Report Status 11/16/2022 FINAL  Final  Resp panel by RT-PCR (RSV, Flu A&B, Covid) Anterior Nasal Swab     Status: None   Collection Time: 11/11/22  4:25 PM   Specimen: Anterior Nasal Swab  Result Value Ref Range Status   SARS Coronavirus 2 by RT PCR NEGATIVE NEGATIVE Final    Comment: (NOTE) SARS-CoV-2 target nucleic acids are NOT DETECTED.  The SARS-CoV-2 RNA is generally detectable in upper respiratory specimens during the acute phase of infection. The lowest concentration of SARS-CoV-2 viral copies this assay can detect is 138 copies/mL. A negative result does not preclude SARS-Cov-2 infection and should not be used as the sole basis for treatment or other patient management decisions. A negative result may occur with  improper specimen collection/handling, submission of specimen other than nasopharyngeal swab, presence of viral mutation(s) within the areas targeted by this assay, and inadequate number of viral copies(<138 copies/mL). A negative result must be combined with clinical observations, patient history, and epidemiological information. The expected result is Negative.  Fact Sheet for Patients:  EntrepreneurPulse.com.au  Fact Sheet for Healthcare Providers:  IncredibleEmployment.be  This test is no t yet approved or cleared by the [place] FDA and  has been authorized for detection and/or diagnosis of SARS-CoV-2 by FDA under an Emergency Use Authorization (EUA). This EUA will remain  in effect (meaning this test can be used) for the duration of the COVID-19 declaration under Section 564(b)(1) of the Act, 21 U.S.C.section 360bbb-3(b)(1), unless the authorization is terminated  or revoked sooner.       Influenza A by PCR NEGATIVE NEGATIVE Final   Influenza B by PCR NEGATIVE NEGATIVE Final    Comment: (NOTE) The  Xpert Xpress SARS-CoV-2/FLU/RSV plus assay is  intended as an aid in the diagnosis of influenza from Nasopharyngeal swab specimens and should not be used as a sole basis for treatment. Nasal washings and aspirates are unacceptable for Xpert Xpress SARS-CoV-2/FLU/RSV testing.  Fact Sheet for Patients: EntrepreneurPulse.com.au  Fact Sheet for Healthcare Providers: IncredibleEmployment.be  This test is not yet approved or cleared by the [place] FDA and has been authorized for detection and/or diagnosis of SARS-CoV-2 by FDA under an Emergency Use Authorization (EUA). This EUA will remain in effect (meaning this test can be used) for the duration of the COVID-19 declaration under Section 564(b)(1) of the Act, 21 U.S.C. section 360bbb-3(b)(1), unless the authorization is terminated or revoked.     Resp Syncytial Virus by PCR NEGATIVE NEGATIVE Final    Comment: (NOTE) Fact Sheet for Patients: EntrepreneurPulse.com.au  Fact Sheet for Healthcare Providers: IncredibleEmployment.be  This test is not yet approved or cleared by the [place] FDA and has been authorized for detection and/or diagnosis of SARS-CoV-2 by FDA under an Emergency Use Authorization (EUA). This EUA will remain in effect (meaning this test can be used) for the duration of the COVID-19 declaration under Section 564(b)(1) of the Act, 21 U.S.C. section 360bbb-3(b)(1), unless the authorization is terminated or revoked.  Performed at [facility], [address], [place], [place] [address]      Labs: BNP (last 3 results) Recent Labs    12/16/21 1629 01/03/22 1626 11/11/22 1246  BNP 247.1* 140.8* 073.7*   Basic Metabolic Panel: Recent Labs  Lab 11/11/22 1246 11/12/22 0327 11/13/22 0433 11/14/22 0503 11/15/22 0902 11/16/22 0330 11/17/22 0342  NA 135 135 134* 134* 131* 134* 136  K 3.9 3.6 3.2* 4.4 3.9 4.0 4.5  CL 96* 94* 92* 94* 90* 91* 93*  CO2 29 29 32 29 29 33* 31   GLUCOSE 96 93 105* 95 145* 123* 114*  BUN 25* 19 24* 23 22 26* 25*  CREATININE 1.36* 1.17* 1.64* 1.71* 1.64* 1.75* 1.58*  CALCIUM 9.1 8.7* 8.9 8.6* 8.5* 9.0 9.2  MG 2.1 1.9  --   --   --  2.1 2.0  PHOS  --  2.7  --  3.0  --   --   --    Liver Function Tests: Recent Labs  Lab 11/11/22 1457 11/12/22 0327 11/14/22 0503  AST 20 19  --   ALT 11 10  --   ALKPHOS 88 85  --   BILITOT 0.7 0.7  --   PROT 6.7 6.3*  --   ALBUMIN 3.2* 3.1* 2.7*   Recent Labs  Lab 11/11/22 1457  LIPASE 24   No results for input(s): "AMMONIA" in the last 168 hours. CBC: Recent Labs  Lab 11/11/22 1246 11/12/22 0327 11/16/22 0330  WBC 9.5 9.4 7.4  NEUTROABS 7.1  --   --   HGB 11.2* 11.0* 10.2*  HCT 37.9 36.2 33.8*  MCV 85.2 84.4 85.1  PLT 237 226 268   Cardiac Enzymes: No results for input(s): "CKTOTAL", "CKMB", "CKMBINDEX", "TROPONINI" in the last 168 hours. BNP: Invalid input(s): "POCBNP" CBG: Recent Labs  Lab 11/15/22 1651  GLUCAP 135*   D-Dimer No results for input(s): "DDIMER" in the last 72 hours. Hgb A1c No results for input(s): "HGBA1C" in the last 72 hours. Lipid Profile No results for input(s): "CHOL", "HDL", "LDLCALC", "TRIG", "CHOLHDL", "LDLDIRECT" in the last 72 hours. Thyroid function studies No results for input(s): "TSH", "T4TOTAL", "T3FREE", "THYROIDAB" in the last 72 hours.  Invalid input(s): "FREET3" Anemia work up No results for input(s): "VITAMINB12", "FOLATE", "FERRITIN", "TIBC", "IRON", "RETICCTPCT" in the last 72 hours. Urinalysis    Component Value Date/Time   COLORURINE AMBER (A) 11/11/2022 1430   APPEARANCEUR CLEAR 11/11/2022 1430   LABSPEC 1.019 11/11/2022 1430   PHURINE 6.0 11/11/2022 1430   GLUCOSEU NEGATIVE 11/11/2022 1430   HGBUR SMALL (A) 11/11/2022 1430   BILIRUBINUR NEGATIVE 11/11/2022 1430   KETONESUR 20 (A) 11/11/2022 1430   PROTEINUR 30 (A) 11/11/2022 1430   UROBILINOGEN 0.2 07/31/2014 1641   NITRITE NEGATIVE 11/11/2022 1430    LEUKOCYTESUR NEGATIVE 11/11/2022 1430   Sepsis Labs Recent Labs  Lab 11/11/22 1246 11/12/22 0327 11/16/22 0330  WBC 9.5 9.4 7.4   Microbiology Recent Results (from the past 240 hour(s))  Urine Culture     Status: Abnormal   Collection Time: 11/11/22  2:30 PM   Specimen: Urine, Clean Catch  Result Value Ref Range Status   Specimen Description   Final    URINE, CLEAN CATCH Performed at [facility], [address] [place], [place] [address]    Special Requests   Final    NONE Performed at [facility], [address], [place], [place] [address]    Culture 10,000 COLONIES/mL PROTEUS MIRABILIS (A)  Final   Report Status 11/13/2022 FINAL  Final   Organism ID, Bacteria PROTEUS MIRABILIS (A)  Final      Susceptibility   Proteus mirabilis - MIC*    AMPICILLIN <=2 SENSITIVE Sensitive     CEFAZOLIN 8 SENSITIVE Sensitive     CEFEPIME <=0.12 SENSITIVE Sensitive     CEFTRIAXONE <=0.25 SENSITIVE Sensitive     CIPROFLOXACIN >=4 RESISTANT Resistant     GENTAMICIN 8 INTERMEDIATE Intermediate     IMIPENEM 2 SENSITIVE Sensitive     NITROFURANTOIN RESISTANT Resistant     TRIMETH/SULFA >=320 RESISTANT Resistant     AMPICILLIN/SULBACTAM <=2 SENSITIVE Sensitive     PIP/TAZO <=4 SENSITIVE Sensitive     * 10,000 COLONIES/mL PROTEUS MIRABILIS  Blood culture (routine x 2)     Status: None   Collection Time: 11/11/22  2:57 PM   Specimen: BLOOD RIGHT ARM  Result Value Ref Range Status   Specimen Description   Final    BLOOD RIGHT ARM BOTTLES DRAWN AEROBIC AND ANAEROBIC   Special Requests Blood Culture adequate volume  Final   Culture   Final    NO GROWTH 5 DAYS Performed at [facility], [address], [place], [place] [address]    Report Status 11/16/2022 FINAL  Final  Blood culture (routine x 2)     Status: None   Collection Time: 11/11/22  2:57 PM   Specimen: Left Antecubital; Blood  Result Value Ref Range Status   Specimen Description   Final    LEFT ANTECUBITAL BOTTLES DRAWN AEROBIC  AND ANAEROBIC   Special Requests Blood Culture adequate volume  Final   Culture   Final    NO GROWTH 5 DAYS Performed at [facility], [address], [place], [place] [address]    Report Status 11/16/2022 FINAL  Final  Resp panel by RT-PCR (RSV, Flu A&B, Covid) Anterior Nasal Swab     Status: None   Collection Time: 11/11/22  4:25 PM   Specimen: Anterior Nasal Swab  Result Value Ref Range Status   SARS Coronavirus 2 by RT PCR NEGATIVE NEGATIVE Final    Comment: (NOTE) SARS-CoV-2 target nucleic acids are NOT DETECTED.  The SARS-CoV-2 RNA is generally detectable  in upper respiratory specimens during the acute phase of infection. The lowest concentration of SARS-CoV-2 viral copies this assay can detect is 138 copies/mL. A negative result does not preclude SARS-Cov-2 infection and should not be used as the sole basis for treatment or other patient management decisions. A negative result may occur with  improper specimen collection/handling, submission of specimen other than nasopharyngeal swab, presence of viral mutation(s) within the areas targeted by this assay, and inadequate number of viral copies(<138 copies/mL). A negative result must be combined with clinical observations, patient history, and epidemiological information. The expected result is Negative.  Fact Sheet for Patients:  EntrepreneurPulse.com.au  Fact Sheet for Healthcare Providers:  IncredibleEmployment.be  This test is no t yet approved or cleared by the [place] FDA and  has been authorized for detection and/or diagnosis of SARS-CoV-2 by FDA under an Emergency Use Authorization (EUA). This EUA will remain  in effect (meaning this test can be used) for the duration of the COVID-19 declaration under Section 564(b)(1) of the Act, 21 U.S.C.section 360bbb-3(b)(1), unless the authorization is terminated  or revoked sooner.       Influenza A by PCR NEGATIVE NEGATIVE Final    Influenza B by PCR NEGATIVE NEGATIVE Final    Comment: (NOTE) The Xpert Xpress SARS-CoV-2/FLU/RSV plus assay is intended as an aid in the diagnosis of influenza from Nasopharyngeal swab specimens and should not be used as a sole basis for treatment. Nasal washings and aspirates are unacceptable for Xpert Xpress SARS-CoV-2/FLU/RSV testing.  Fact Sheet for Patients: EntrepreneurPulse.com.au  Fact Sheet for Healthcare Providers: IncredibleEmployment.be  This test is not yet approved or cleared by the [place] FDA and has been authorized for detection and/or diagnosis of SARS-CoV-2 by FDA under an Emergency Use Authorization (EUA). This EUA will remain in effect (meaning this test can be used) for the duration of the COVID-19 declaration under Section 564(b)(1) of the Act, 21 U.S.C. section 360bbb-3(b)(1), unless the authorization is terminated or revoked.     Resp Syncytial Virus by PCR NEGATIVE NEGATIVE Final    Comment: (NOTE) Fact Sheet for Patients: EntrepreneurPulse.com.au  Fact Sheet for Healthcare Providers: IncredibleEmployment.be  This test is not yet approved or cleared by the [place] FDA and has been authorized for detection and/or diagnosis of SARS-CoV-2 by FDA under an Emergency Use Authorization (EUA). This EUA will remain in effect (meaning this test can be used) for the duration of the COVID-19 declaration under Section 564(b)(1) of the Act, 21 U.S.C. section 360bbb-3(b)(1), unless the authorization is terminated or revoked.  Performed at [facility], [address], [place], [place] [address]    Time coordinating discharge: 41 mins   SIGNED:  Irwin Brakeman, MD  [facility] 11/17/2022, 11:50 AM How to contact the [facility] Attending or Consulting provider [place] or covering provider during after hours [place], for this patient?  Check the care team in [facility] and look for a)  attending/consulting [facility] provider listed and b) the [facility] team listed Log into [email or web address] and use [place]'s universal password to access. If you do not have the password, please contact the hospital operator. Locate the [facility] provider you are looking for under [facility] and page to a number that you can be directly reached. If you still have difficulty reaching the provider, please page the [facility] (Director on Call) for the Hospitalists listed on amion for assistance.

## 2022-11-17 NOTE — Discharge Instructions (Signed)
IMPORTANT INFORMATION: PAY CLOSE ATTENTION   PHYSICIAN DISCHARGE INSTRUCTIONS  Follow with Primary care provider  Practice, [facility]  and other consultants as instructed by your Hospitalist Physician  SEEK MEDICAL CARE OR RETURN TO EMERGENCY ROOM IF SYMPTOMS COME BACK, WORSEN OR NEW PROBLEM DEVELOPS   Please note: You were cared for by a hospitalist during your hospital stay. Every effort will be made to forward records to your primary care provider.  You can request that your primary care provider send for your hospital records if they have not received them.  Once you are discharged, your primary care physician will handle any further medical issues. Please note that NO REFILLS for any discharge medications will be authorized once you are discharged, as it is imperative that you return to your primary care physician (or establish a relationship with a primary care physician if you do not have one) for your post hospital discharge needs so that they can reassess your need for medications and monitor your lab values.  Please get a complete blood count and chemistry panel checked by your Primary MD at your next visit, and again as instructed by your Primary MD.  Get Medicines reviewed and adjusted: Please take all your medications with you for your next visit with your Primary MD  Laboratory/radiological data: Please request your Primary MD to go over all hospital tests and procedure/radiological results at the follow up, please ask your primary care provider to get all Hospital records sent to his/her office.  In some cases, they will be blood work, cultures and biopsy results pending at the time of your discharge. Please request that your primary care provider follow up on these results.  If you are diabetic, please bring your blood sugar readings with you to your follow up appointment with primary care.    Please call and make your follow up appointments as soon as possible.     Also Note the following: If you experience worsening of your admission symptoms, develop shortness of breath, life threatening emergency, suicidal or homicidal thoughts you must seek medical attention immediately by calling 911 or calling your MD immediately  if symptoms less severe.  You must read complete instructions/literature along with all the possible adverse reactions/side effects for all the Medicines you take and that have been prescribed to you. Take any new Medicines after you have completely understood and accpet all the possible adverse reactions/side effects.   Do not drive when taking Pain medications or sleeping medications (Benzodiazepines)  Do not take more than prescribed Pain, Sleep and Anxiety Medications. It is not advisable to combine anxiety,sleep and pain medications without talking with your primary care practitioner  Special Instructions: If you have smoked or chewed Tobacco  in the last 2 yrs please stop smoking, stop any regular Alcohol  and or any Recreational drug use.  Wear Seat belts while driving.  Do not drive if taking any narcotic, mind altering or controlled substances or recreational drugs or alcohol.       

## 2022-11-17 NOTE — Care Management Important Message (Signed)
Important Message  Patient Details  Name: Amy [name] MRN: [id number] Date of Birth: 03-19-1922   Medicare Important Message Given:  Yes     Tommy Medal 11/17/2022, 1:05 PM

## 2022-11-18 DIAGNOSIS — S8391XD Sprain of unspecified site of right knee, subsequent encounter: Secondary | ICD-10-CM | POA: Diagnosis not present

## 2022-11-18 DIAGNOSIS — I25110 Atherosclerotic heart disease of native coronary artery with unstable angina pectoris: Secondary | ICD-10-CM | POA: Diagnosis not present

## 2022-11-18 DIAGNOSIS — I130 Hypertensive heart and chronic kidney disease with heart failure and stage 1 through stage 4 chronic kidney disease, or unspecified chronic kidney disease: Secondary | ICD-10-CM | POA: Diagnosis not present

## 2022-11-18 DIAGNOSIS — F419 Anxiety disorder, unspecified: Secondary | ICD-10-CM | POA: Diagnosis not present

## 2022-11-18 DIAGNOSIS — I5032 Chronic diastolic (congestive) heart failure: Secondary | ICD-10-CM | POA: Diagnosis not present

## 2022-11-18 DIAGNOSIS — N183 Chronic kidney disease, stage 3 unspecified: Secondary | ICD-10-CM | POA: Diagnosis not present

## 2022-11-20 DIAGNOSIS — F419 Anxiety disorder, unspecified: Secondary | ICD-10-CM | POA: Diagnosis not present

## 2022-11-20 DIAGNOSIS — Z7901 Long term (current) use of anticoagulants: Secondary | ICD-10-CM | POA: Diagnosis not present

## 2022-11-20 DIAGNOSIS — Z950 Presence of cardiac pacemaker: Secondary | ICD-10-CM | POA: Diagnosis not present

## 2022-11-20 DIAGNOSIS — Z9181 History of falling: Secondary | ICD-10-CM | POA: Diagnosis not present

## 2022-11-20 DIAGNOSIS — Z79899 Other long term (current) drug therapy: Secondary | ICD-10-CM | POA: Diagnosis not present

## 2022-11-20 DIAGNOSIS — I6522 Occlusion and stenosis of left carotid artery: Secondary | ICD-10-CM | POA: Diagnosis not present

## 2022-11-20 DIAGNOSIS — I25110 Atherosclerotic heart disease of native coronary artery with unstable angina pectoris: Secondary | ICD-10-CM | POA: Diagnosis not present

## 2022-11-20 DIAGNOSIS — I4819 Other persistent atrial fibrillation: Secondary | ICD-10-CM | POA: Diagnosis not present

## 2022-11-20 DIAGNOSIS — Z602 Problems related to living alone: Secondary | ICD-10-CM | POA: Diagnosis not present

## 2022-11-20 DIAGNOSIS — I495 Sick sinus syndrome: Secondary | ICD-10-CM | POA: Diagnosis not present

## 2022-11-20 DIAGNOSIS — M19011 Primary osteoarthritis, right shoulder: Secondary | ICD-10-CM | POA: Diagnosis not present

## 2022-11-20 DIAGNOSIS — I088 Other rheumatic multiple valve diseases: Secondary | ICD-10-CM | POA: Diagnosis not present

## 2022-11-20 DIAGNOSIS — N390 Urinary tract infection, site not specified: Secondary | ICD-10-CM | POA: Diagnosis not present

## 2022-11-20 DIAGNOSIS — I700 Atherosclerosis of aorta: Secondary | ICD-10-CM | POA: Diagnosis not present

## 2022-11-20 DIAGNOSIS — I5033 Acute on chronic diastolic (congestive) heart failure: Secondary | ICD-10-CM | POA: Diagnosis not present

## 2022-11-20 DIAGNOSIS — Z86718 Personal history of other venous thrombosis and embolism: Secondary | ICD-10-CM | POA: Diagnosis not present

## 2022-11-20 DIAGNOSIS — N1832 Chronic kidney disease, stage 3b: Secondary | ICD-10-CM | POA: Diagnosis not present

## 2022-11-20 DIAGNOSIS — B964 Proteus (mirabilis) (morganii) as the cause of diseases classified elsewhere: Secondary | ICD-10-CM | POA: Diagnosis not present

## 2022-11-20 DIAGNOSIS — E039 Hypothyroidism, unspecified: Secondary | ICD-10-CM | POA: Diagnosis not present

## 2022-11-20 DIAGNOSIS — J45909 Unspecified asthma, uncomplicated: Secondary | ICD-10-CM | POA: Diagnosis not present

## 2022-11-20 DIAGNOSIS — M19012 Primary osteoarthritis, left shoulder: Secondary | ICD-10-CM | POA: Diagnosis not present

## 2022-11-20 DIAGNOSIS — I4892 Unspecified atrial flutter: Secondary | ICD-10-CM | POA: Diagnosis not present

## 2022-11-20 DIAGNOSIS — I272 Pulmonary hypertension, unspecified: Secondary | ICD-10-CM | POA: Diagnosis not present

## 2022-11-20 DIAGNOSIS — D509 Iron deficiency anemia, unspecified: Secondary | ICD-10-CM | POA: Diagnosis not present

## 2022-11-20 DIAGNOSIS — I130 Hypertensive heart and chronic kidney disease with heart failure and stage 1 through stage 4 chronic kidney disease, or unspecified chronic kidney disease: Secondary | ICD-10-CM | POA: Diagnosis not present

## 2022-11-24 DIAGNOSIS — I4892 Unspecified atrial flutter: Secondary | ICD-10-CM | POA: Diagnosis not present

## 2022-11-24 DIAGNOSIS — I130 Hypertensive heart and chronic kidney disease with heart failure and stage 1 through stage 4 chronic kidney disease, or unspecified chronic kidney disease: Secondary | ICD-10-CM | POA: Diagnosis not present

## 2022-11-24 DIAGNOSIS — I4819 Other persistent atrial fibrillation: Secondary | ICD-10-CM | POA: Diagnosis not present

## 2022-11-24 DIAGNOSIS — N1832 Chronic kidney disease, stage 3b: Secondary | ICD-10-CM | POA: Diagnosis not present

## 2022-11-24 DIAGNOSIS — I5033 Acute on chronic diastolic (congestive) heart failure: Secondary | ICD-10-CM | POA: Diagnosis not present

## 2022-11-24 DIAGNOSIS — I25110 Atherosclerotic heart disease of native coronary artery with unstable angina pectoris: Secondary | ICD-10-CM | POA: Diagnosis not present

## 2022-11-25 DIAGNOSIS — I4819 Other persistent atrial fibrillation: Secondary | ICD-10-CM | POA: Diagnosis not present

## 2022-11-25 DIAGNOSIS — I130 Hypertensive heart and chronic kidney disease with heart failure and stage 1 through stage 4 chronic kidney disease, or unspecified chronic kidney disease: Secondary | ICD-10-CM | POA: Diagnosis not present

## 2022-11-25 DIAGNOSIS — I4892 Unspecified atrial flutter: Secondary | ICD-10-CM | POA: Diagnosis not present

## 2022-11-25 DIAGNOSIS — I25110 Atherosclerotic heart disease of native coronary artery with unstable angina pectoris: Secondary | ICD-10-CM | POA: Diagnosis not present

## 2022-11-25 DIAGNOSIS — N1832 Chronic kidney disease, stage 3b: Secondary | ICD-10-CM | POA: Diagnosis not present

## 2022-11-25 DIAGNOSIS — I5033 Acute on chronic diastolic (congestive) heart failure: Secondary | ICD-10-CM | POA: Diagnosis not present

## 2022-11-28 ENCOUNTER — Inpatient Hospital Stay (HOSPITAL_COMMUNITY)
Admission: EM | Admit: 2022-11-28 | Discharge: 2022-12-01 | DRG: 871 | Disposition: A | Discharge status: Home Health | Payer: Medicare Other | Attending: Family Medicine | Admitting: Family Medicine

## 2022-11-28 ENCOUNTER — Other Ambulatory Visit: Payer: Self-pay

## 2022-11-28 ENCOUNTER — Emergency Department (HOSPITAL_COMMUNITY): Payer: Medicare Other

## 2022-11-28 ENCOUNTER — Encounter (HOSPITAL_COMMUNITY): Payer: Self-pay

## 2022-11-28 DIAGNOSIS — D509 Iron deficiency anemia, unspecified: Secondary | ICD-10-CM | POA: Diagnosis present

## 2022-11-28 DIAGNOSIS — Z823 Family history of stroke: Secondary | ICD-10-CM

## 2022-11-28 DIAGNOSIS — I5033 Acute on chronic diastolic (congestive) heart failure: Secondary | ICD-10-CM | POA: Diagnosis not present

## 2022-11-28 DIAGNOSIS — J9601 Acute respiratory failure with hypoxia: Secondary | ICD-10-CM | POA: Diagnosis present

## 2022-11-28 DIAGNOSIS — Z888 Allergy status to other drugs, medicaments and biological substances status: Secondary | ICD-10-CM

## 2022-11-28 DIAGNOSIS — E7800 Pure hypercholesterolemia, unspecified: Secondary | ICD-10-CM | POA: Diagnosis present

## 2022-11-28 DIAGNOSIS — I4892 Unspecified atrial flutter: Secondary | ICD-10-CM | POA: Diagnosis not present

## 2022-11-28 DIAGNOSIS — Z950 Presence of cardiac pacemaker: Secondary | ICD-10-CM | POA: Diagnosis not present

## 2022-11-28 DIAGNOSIS — Z66 Do not resuscitate: Secondary | ICD-10-CM | POA: Diagnosis present

## 2022-11-28 DIAGNOSIS — I480 Paroxysmal atrial fibrillation: Secondary | ICD-10-CM | POA: Diagnosis present

## 2022-11-28 DIAGNOSIS — I130 Hypertensive heart and chronic kidney disease with heart failure and stage 1 through stage 4 chronic kidney disease, or unspecified chronic kidney disease: Secondary | ICD-10-CM | POA: Diagnosis not present

## 2022-11-28 DIAGNOSIS — F419 Anxiety disorder, unspecified: Secondary | ICD-10-CM | POA: Diagnosis not present

## 2022-11-28 DIAGNOSIS — Z951 Presence of aortocoronary bypass graft: Secondary | ICD-10-CM

## 2022-11-28 DIAGNOSIS — K219 Gastro-esophageal reflux disease without esophagitis: Secondary | ICD-10-CM | POA: Diagnosis present

## 2022-11-28 DIAGNOSIS — I350 Nonrheumatic aortic (valve) stenosis: Secondary | ICD-10-CM | POA: Diagnosis present

## 2022-11-28 DIAGNOSIS — J45909 Unspecified asthma, uncomplicated: Secondary | ICD-10-CM | POA: Diagnosis present

## 2022-11-28 DIAGNOSIS — R0902 Hypoxemia: Secondary | ICD-10-CM | POA: Diagnosis not present

## 2022-11-28 DIAGNOSIS — J9600 Acute respiratory failure, unspecified whether with hypoxia or hypercapnia: Secondary | ICD-10-CM

## 2022-11-28 DIAGNOSIS — I495 Sick sinus syndrome: Secondary | ICD-10-CM | POA: Diagnosis not present

## 2022-11-28 DIAGNOSIS — I5032 Chronic diastolic (congestive) heart failure: Secondary | ICD-10-CM | POA: Diagnosis not present

## 2022-11-28 DIAGNOSIS — I252 Old myocardial infarction: Secondary | ICD-10-CM | POA: Diagnosis not present

## 2022-11-28 DIAGNOSIS — I272 Pulmonary hypertension, unspecified: Secondary | ICD-10-CM | POA: Diagnosis present

## 2022-11-28 DIAGNOSIS — Z96642 Presence of left artificial hip joint: Secondary | ICD-10-CM | POA: Diagnosis present

## 2022-11-28 DIAGNOSIS — I700 Atherosclerosis of aorta: Secondary | ICD-10-CM | POA: Diagnosis present

## 2022-11-28 DIAGNOSIS — A4189 Other specified sepsis: Secondary | ICD-10-CM | POA: Diagnosis present

## 2022-11-28 DIAGNOSIS — R6521 Severe sepsis with septic shock: Secondary | ICD-10-CM | POA: Diagnosis present

## 2022-11-28 DIAGNOSIS — E039 Hypothyroidism, unspecified: Secondary | ICD-10-CM | POA: Diagnosis present

## 2022-11-28 DIAGNOSIS — J1282 Pneumonia due to coronavirus disease 2019: Secondary | ICD-10-CM | POA: Diagnosis present

## 2022-11-28 DIAGNOSIS — I2510 Atherosclerotic heart disease of native coronary artery without angina pectoris: Secondary | ICD-10-CM | POA: Diagnosis present

## 2022-11-28 DIAGNOSIS — U071 COVID-19: Secondary | ICD-10-CM | POA: Diagnosis present

## 2022-11-28 DIAGNOSIS — R0602 Shortness of breath: Secondary | ICD-10-CM | POA: Diagnosis not present

## 2022-11-28 DIAGNOSIS — I10 Essential (primary) hypertension: Secondary | ICD-10-CM | POA: Diagnosis not present

## 2022-11-28 DIAGNOSIS — Z79890 Hormone replacement therapy (postmenopausal): Secondary | ICD-10-CM

## 2022-11-28 DIAGNOSIS — S8391XD Sprain of unspecified site of right knee, subsequent encounter: Secondary | ICD-10-CM | POA: Diagnosis not present

## 2022-11-28 DIAGNOSIS — N1832 Chronic kidney disease, stage 3b: Secondary | ICD-10-CM | POA: Diagnosis present

## 2022-11-28 DIAGNOSIS — Z79899 Other long term (current) drug therapy: Secondary | ICD-10-CM

## 2022-11-28 DIAGNOSIS — J90 Pleural effusion, not elsewhere classified: Secondary | ICD-10-CM | POA: Diagnosis not present

## 2022-11-28 DIAGNOSIS — N183 Chronic kidney disease, stage 3 unspecified: Secondary | ICD-10-CM | POA: Diagnosis not present

## 2022-11-28 DIAGNOSIS — I25110 Atherosclerotic heart disease of native coronary artery with unstable angina pectoris: Secondary | ICD-10-CM | POA: Diagnosis not present

## 2022-11-28 DIAGNOSIS — Z885 Allergy status to narcotic agent status: Secondary | ICD-10-CM | POA: Diagnosis not present

## 2022-11-28 DIAGNOSIS — Z7901 Long term (current) use of anticoagulants: Secondary | ICD-10-CM

## 2022-11-28 DIAGNOSIS — I4819 Other persistent atrial fibrillation: Secondary | ICD-10-CM | POA: Diagnosis not present

## 2022-11-28 LAB — PROCALCITONIN: Procalcitonin: <0.1 ng/mL

## 2022-11-28 LAB — CBC
HCT: 33.9 % — ABNORMAL LOW (ref 36.0–46.0)
Hemoglobin: 10.2 g/dL — ABNORMAL LOW (ref 12.0–15.0)
MCH: 26.1 pg (ref 26.0–34.0)
MCHC: 30.1 g/dL (ref 30.0–36.0)
MCV: 86.7 fL (ref 80.0–100.0)
Platelets: 214 10*3/uL (ref 150–400)
RBC: 3.91 MIL/uL (ref 3.87–5.11)
RDW: 18 % — ABNORMAL HIGH (ref 11.5–15.5)
WBC: 5.9 10*3/uL (ref 4.0–10.5)
nRBC: 0 % (ref 0.0–0.2)

## 2022-11-28 LAB — COMPREHENSIVE METABOLIC PANEL
ALT: 13 U/L (ref 0–44)
AST: 27 U/L (ref 15–41)
Albumin: 3 g/dL — ABNORMAL LOW (ref 3.5–5.0)
Alkaline Phosphatase: 81 U/L (ref 38–126)
Anion gap: 8 (ref 5–15)
BUN: 25 mg/dL — ABNORMAL HIGH (ref 8–23)
CO2: 29 mmol/L (ref 22–32)
Calcium: 9 mg/dL (ref 8.9–10.3)
Chloride: 98 mmol/L (ref 98–111)
Creatinine, Ser: 1.5 mg/dL — ABNORMAL HIGH (ref 0.44–1.00)
GFR, Estimated: 31 mL/min — ABNORMAL LOW (ref >60)
Glucose, Bld: 94 mg/dL (ref 70–99)
Potassium: 4 mmol/L (ref 3.5–5.1)
Sodium: 135 mmol/L (ref 135–145)
Total Bilirubin: 0.6 mg/dL (ref 0.3–1.2)
Total Protein: 6.2 g/dL — ABNORMAL LOW (ref 6.5–8.1)

## 2022-11-28 LAB — BRAIN NATRIURETIC PEPTIDE: B Natriuretic Peptide: 540 pg/mL — ABNORMAL HIGH (ref 0.0–100.0)

## 2022-11-28 LAB — D-DIMER, QUANTITATIVE: D-Dimer, Quant: 1.22 ug/mL-FEU — ABNORMAL HIGH (ref 0.00–0.50)

## 2022-11-28 LAB — MAGNESIUM: Magnesium: 2 mg/dL (ref 1.7–2.4)

## 2022-11-28 LAB — RESP PANEL BY RT-PCR (RSV, FLU A&B, COVID)  RVPGX2
Influenza A by PCR: NEGATIVE
Influenza B by PCR: NEGATIVE
Resp Syncytial Virus by PCR: NEGATIVE
SARS Coronavirus 2 by RT PCR: POSITIVE — AB

## 2022-11-28 LAB — TSH: TSH: 0.307 u[IU]/mL — ABNORMAL LOW (ref 0.350–4.500)

## 2022-11-28 LAB — TROPONIN I (HIGH SENSITIVITY)
Troponin I (High Sensitivity): 25 ng/L — ABNORMAL HIGH (ref <18)
Troponin I (High Sensitivity): 22 ng/L — ABNORMAL HIGH (ref <18)

## 2022-11-28 LAB — LACTIC ACID, PLASMA
Lactic Acid, Venous: 1.2 mmol/L (ref 0.5–1.9)
Lactic Acid, Venous: 1 mmol/L (ref 0.5–1.9)

## 2022-11-28 LAB — FIBRINOGEN: Fibrinogen: 562 mg/dL — ABNORMAL HIGH (ref 210–475)

## 2022-11-28 LAB — FERRITIN: Ferritin: 36 ng/mL (ref 11–307)

## 2022-11-28 MED ORDER — UMECLIDINIUM BROMIDE 62.5 MCG/ACT IN AEPB
1.0000 | INHALATION_SPRAY | Freq: Every day | RESPIRATORY_TRACT | Status: DC
  Administered 2022-11-30 – 2022-12-01 (×2): 1 via RESPIRATORY_TRACT
  Filled 2022-11-28: qty 7

## 2022-11-28 MED ORDER — LEVOTHYROXINE SODIUM 75 MCG PO TABS
75.0000 ug | ORAL_TABLET | Freq: Every day | ORAL | Status: DC
  Administered 2022-11-29 – 2022-12-01 (×3): 75 ug via ORAL
  Filled 2022-11-28 (×3): qty 1

## 2022-11-28 MED ORDER — ALBUTEROL SULFATE (2.5 MG/3ML) 0.083% IN NEBU
2.5000 mg | INHALATION_SOLUTION | Freq: Three times a day (TID) | RESPIRATORY_TRACT | Status: DC
  Administered 2022-11-28 – 2022-12-01 (×8): 2.5 mg via RESPIRATORY_TRACT
  Filled 2022-11-28 (×8): qty 3

## 2022-11-28 MED ORDER — SODIUM CHLORIDE 0.9 % IV SOLN
2.0000 g | Freq: Once | INTRAVENOUS | Status: AC
  Administered 2022-11-28: 2 g via INTRAVENOUS
  Filled 2022-11-28: qty 12.5

## 2022-11-28 MED ORDER — METOPROLOL SUCCINATE ER 25 MG PO TB24
25.0000 mg | ORAL_TABLET | Freq: Every day | ORAL | Status: DC
  Administered 2022-11-29 – 2022-12-01 (×3): 25 mg via ORAL
  Filled 2022-11-28 (×3): qty 1

## 2022-11-28 MED ORDER — SENNOSIDES-DOCUSATE SODIUM 8.6-50 MG PO TABS: 1.0000 | ORAL_TABLET | Freq: Every evening | ORAL | Status: DC | PRN

## 2022-11-28 MED ORDER — ZINC SULFATE 220 (50 ZN) MG PO CAPS
220.0000 mg | ORAL_CAPSULE | Freq: Every day | ORAL | Status: DC
  Administered 2022-11-29 – 2022-12-01 (×3): 220 mg via ORAL
  Filled 2022-11-28 (×3): qty 1

## 2022-11-28 MED ORDER — ALBUTEROL SULFATE HFA 108 (90 BASE) MCG/ACT IN AERS: 2.0000 | INHALATION_SPRAY | Freq: Four times a day (QID) | RESPIRATORY_TRACT | Status: DC | PRN

## 2022-11-28 MED ORDER — ACETAMINOPHEN 500 MG PO TABS
1000.0000 mg | ORAL_TABLET | Freq: Once | ORAL | Status: AC
  Administered 2022-11-28: 1000 mg via ORAL
  Filled 2022-11-28: qty 2

## 2022-11-28 MED ORDER — ACETAMINOPHEN 650 MG RE SUPP: 650.0000 mg | Freq: Four times a day (QID) | RECTAL | Status: DC | PRN

## 2022-11-28 MED ORDER — VANCOMYCIN HCL IN DEXTROSE 1-5 GM/200ML-% IV SOLN: 1000.0000 mg | INTRAVENOUS | Status: DC

## 2022-11-28 MED ORDER — VANCOMYCIN HCL 1500 MG/300ML IV SOLN
1500.0000 mg | Freq: Once | INTRAVENOUS | Status: AC
  Administered 2022-11-28: 1500 mg via INTRAVENOUS
  Filled 2022-11-28: qty 300

## 2022-11-28 MED ORDER — ACETAMINOPHEN 325 MG PO TABS
650.0000 mg | ORAL_TABLET | Freq: Four times a day (QID) | ORAL | Status: DC | PRN
  Administered 2022-11-29 – 2022-11-30 (×2): 650 mg via ORAL
  Filled 2022-11-28 (×2): qty 2

## 2022-11-28 MED ORDER — PANTOPRAZOLE SODIUM 40 MG PO TBEC
40.0000 mg | DELAYED_RELEASE_TABLET | Freq: Every day | ORAL | Status: DC
  Administered 2022-11-29 – 2022-12-01 (×3): 40 mg via ORAL
  Filled 2022-11-28 (×3): qty 1

## 2022-11-28 MED ORDER — BUDESONIDE 0.25 MG/2ML IN SUSP
0.2500 mg | Freq: Two times a day (BID) | RESPIRATORY_TRACT | Status: DC
  Administered 2022-11-28: 0.25 mg via RESPIRATORY_TRACT
  Filled 2022-11-28: qty 2

## 2022-11-28 MED ORDER — METHYLPREDNISOLONE SODIUM SUCC 125 MG IJ SOLR
75.0000 mg | Freq: Two times a day (BID) | INTRAMUSCULAR | Status: AC
  Administered 2022-11-28 – 2022-12-01 (×6): 75 mg via INTRAVENOUS
  Filled 2022-11-28 (×6): qty 2

## 2022-11-28 MED ORDER — SODIUM CHLORIDE 0.9 % IV SOLN: INTRAVENOUS | Status: DC

## 2022-11-28 MED ORDER — FLUTICASONE FUROATE-VILANTEROL 200-25 MCG/ACT IN AEPB
1.0000 | INHALATION_SPRAY | Freq: Every day | RESPIRATORY_TRACT | Status: DC
  Administered 2022-11-30 – 2022-12-01 (×2): 1 via RESPIRATORY_TRACT
  Filled 2022-11-28: qty 28

## 2022-11-28 MED ORDER — GUAIFENESIN-DM 100-10 MG/5ML PO SYRP
10.0000 mL | ORAL_SOLUTION | ORAL | Status: DC | PRN
  Filled 2022-11-28: qty 10

## 2022-11-28 MED ORDER — PREDNISONE 20 MG PO TABS: 50.0000 mg | ORAL_TABLET | Freq: Every day | ORAL | Status: DC

## 2022-11-28 MED ORDER — ALBUTEROL SULFATE HFA 108 (90 BASE) MCG/ACT IN AERS: 2.0000 | INHALATION_SPRAY | Freq: Four times a day (QID) | RESPIRATORY_TRACT | Status: DC

## 2022-11-28 MED ORDER — SODIUM CHLORIDE 0.9 % IV BOLUS
1000.0000 mL | Freq: Once | INTRAVENOUS | Status: AC
  Administered 2022-11-28: 1000 mL via INTRAVENOUS

## 2022-11-28 MED ORDER — APIXABAN 2.5 MG PO TABS
2.5000 mg | ORAL_TABLET | Freq: Two times a day (BID) | ORAL | Status: DC
  Administered 2022-11-28 – 2022-12-01 (×6): 2.5 mg via ORAL
  Filled 2022-11-28 (×6): qty 1

## 2022-11-28 MED ORDER — VITAMIN C 500 MG PO TABS
500.0000 mg | ORAL_TABLET | Freq: Every day | ORAL | Status: DC
  Administered 2022-11-29 – 2022-12-01 (×3): 500 mg via ORAL
  Filled 2022-11-28 (×3): qty 1

## 2022-11-28 MED ORDER — SODIUM CHLORIDE 0.9 % IV SOLN: 1.0000 g | Freq: Once | INTRAVENOUS | Status: DC

## 2022-11-28 NOTE — Assessment & Plan Note (Addendum)
88 year old female with 2 day history of fever, cough and profound weakness found to be hypoxic on room air to 84% in setting of covid 19 with possible pneumonia -obs to telemetry -droplet and airborne precautions with PPE for covid -covid labs -steroids with oxygen requirement -discussed treatment options and that remdesivir has poor studies for covid pneumonia, continue with supportive therapy  -pulmicort BID -sABA prn -anti tussive and vitamins  -IS to bedside -questionable patchy consolidation in left lung base. ? Pneumonia vs. Fluid. BNP and PCT pending. Given vanc/cefepime in ED for sepsis criteria, but holding antibiotics as sepsis likely from covid and can re-initiate if PCT elevated.  -Wean oxygen as tolerated  -PT/OT to assess

## 2022-11-28 NOTE — Assessment & Plan Note (Signed)
S/p PPM in 2010

## 2022-11-28 NOTE — Assessment & Plan Note (Addendum)
No signs of exacerbation in setting of covid Continue trelegy SABA prn

## 2022-11-28 NOTE — Assessment & Plan Note (Signed)
Creatinine baseline around 1.5-1.6 Stable, continue to monitor

## 2022-11-28 NOTE — Assessment & Plan Note (Signed)
Euvolemic to dry on exam  Echo 10/2022: Normal EF with grade 1 DD and moderate to severe AS Strict I/O Continue medical management with toprol-xl and lasix every other day

## 2022-11-28 NOTE — Assessment & Plan Note (Signed)
Hypotensive, but responded to IVF bolus Continue metoprolol tomorrow, but hold if bp soft

## 2022-11-28 NOTE — Assessment & Plan Note (Signed)
Rate controlled in NSR Continue eliquis and toprol

## 2022-11-28 NOTE — Consult Note (Signed)
Pharmacy Antibiotic Note  Amy [name] is a 89 y.o. female admitted on 11/28/2022 with health-care based pneumonia.  Pharmacy has been consulted for vancomycin and cefepime dosing.  Plan:  Vanco '1500mg'$  IV loading dose Cefepime 2gms 1 X dose    Temp (24hrs), Avg:100.4 F (38 C), Min:100.4 F (38 C), Max:100.4 F (38 C)  Recent Labs  Lab 11/28/22 1436 11/28/22 1632  WBC 5.9  --   CREATININE 1.50*  --   LATICACIDVEN 1.2 1.0    Estimated Creatinine Clearance: 21 mL/min (A) (by C-G formula based on SCr of 1.5 mg/dL (H)).    Allergies  Allergen Reactions   Colestipol Other (See Comments)    Muscle aches   Hydrocodone Itching   Niacin And Related Other (See Comments)    Muscle pain   Statins Other (See Comments)    Muscle aches on all she has tried.  She recalls Lipitor, Crestor, and Livalo but thinks there were others   Zetia [Ezetimibe] Other (See Comments)    Muscle aches     Microbiology results: 1/29 BCx: in process x sites 1/12 UCx: Proteus Mirabilis  1/29 MRSA PCR: pending  Thank you for allowing pharmacy to be a part of this patient's care.  [name] 11/28/2022 5:49 PM

## 2022-11-28 NOTE — ED Triage Notes (Signed)
EMS states pt called out for SOB; pt's O2 saturation 84% on room air upon EMS arrival. Placed on 4L [place] bringing O2 up to 97%.

## 2022-11-28 NOTE — H&P (Signed)
History and Physical    Patient: Amy [name] RWE:[id number] DOB: February 26, 1922 DOA: 11/28/2022 DOS: the patient was seen and examined on 11/28/2022 PCP: [facility]  Patient coming from: Home - lives alone, but has 24 hour care. Uses walker/cane to ambulate.    Chief Complaint: shortness of breath   HPI: Amy KAMARIA LUCIA is a 89 y.o. female with medical history significant of  asthma, paroxysmal atrial fibrillation on Eliquis, GERD, CAD s/p CABG x 3, hypertension, hypothyroidism, CKD stage 3b, SSS s/p PPM, chronic diastolic CHF who presented with complaints of shortness of breath. Yesterday she started to feel bad and had a temperature. She was nauseated last night.  This morning she didn't eat breakfast, but it tasted horrible so she went back to bed. Her nurse came and got her up and she was too weak to stand up and got back to the bed.  Her nurse called EMS due to the weakness and low oxygen to 84% on RA. She does have a cough that is mainly dry. If she does have a productive cough it is clear mucous. She has no congestion or sinus pain/pressure. No headaches or new body aches. No swelling in her legs. She was lightheaded this morning. Denies any sick contacts. She does have some dyspnea on exertion, but none at rest. She has no chest pain.   Admitted 11/11/22 for hypoxia secondary to acute on chronic diastolic CHF exacerbation, moderate pulm HTN and moderate to severe aortic stenosis.     Denies any vision changes/headaches, chest pain or palpitations, shortness of breath,  abdominal pain, V/D, dysuria or leg swelling.    She does not smoke or drink.   ER Course:  vitals: temp: 100.4, bp: 149/60, HR; 79, RR: 20, oxygen: 92% on 2L [place] Pertinent labs: covid positive, BUN: 25, creatinine: 1.50, hgb: 10.2,  CXR: patchy consolidation of left lung base with small left pleural effusion, pneumonia of the left lung base is not excluded In ED: TR[facility] asked to admit.     Review of Systems:  As mentioned in the history of present illness. All other systems reviewed and are negative. Past Medical History:  Diagnosis Date   Anxiety    Arthritis    Asthma    CAD in native artery    s/p CABG x 3; Last Myoview in 07/2009 - non-ischemic; Echo 03/2012  Aortic Sclerosis with normal EF.   CKD (chronic kidney disease), stage III (HCC)    DVT (deep vein thrombosis) in pregnancy    Esophageal perforation    9/13   Fall 07/21/2014   FALL WITH INJURY   Fracture of head of humerus    Fracture of hip ([place]) 07/21/2014   LEFT   GERD (gastroesophageal reflux disease)    Hypercholesteremia    Hypertension    Hypothyroidism    Loss of vision 05/22/2018   LEFT EYE   Mediastinitis    s/p drainage   Myocardial infarction [facility])    Inferior STEMI 07/2008 - PCI of prox RCA followed byu CABG x 3 in 9/'09   Pacemaker    PAF (paroxysmal atrial fibrillation) (HCC)    Paroxysmal atrial flutter ([place]) 11/26/2014   Pneumonia    Shortness of breath    SSS (sick sinus syndrome) ([place]) 11/26/2014   Status post hip hemiarthroplasty left hip by Dr Ronnie Derby 08/08/2012   Past Surgical History:  Procedure Laterality Date   CARDIOVERSION N/A 09/21/2018   Procedure: CARDIOVERSION;  Surgeon: Sanda Klein, MD;  Location: [place];  Service: Cardiovascular;  Laterality: N/A;   CARPAL TUNNEL RELEASE  08/08/2012   Procedure: CARPAL TUNNEL RELEASE;  Surgeon: Schuyler Amor, MD;  Location: [place];  Service: Orthopedics;  Laterality: Left;   CORONARY ARTERY BYPASS GRAFT  2009   LIMA-LAD, SVG-RI, SVG-stented RCA   ESOPHAGOGASTRODUODENOSCOPY  08/10/2012   Procedure: ESOPHAGOGASTRODUODENOSCOPY (EGD);  Surgeon: Beryle Beams, MD;  Location: [facility] ENDOSCOPY;  Service: Endoscopy;  Laterality: N/A;   ESOPHAGOSCOPY  08/10/2012   Procedure: ESOPHAGOSCOPY;  Surgeon: [name], MD;  Location: [place];  Service: ENT;  Laterality: N/A;   GASTROSTOMY  08/16/2012   Procedure: GASTROSTOMY;  Surgeon: Zenovia Jarred, MD;   Location: [place];  Service: General;  Laterality: N/A;   HIP ARTHROPLASTY  08/08/2012   Procedure: ARTHROPLASTY BIPOLAR HIP;  Surgeon: Rudean Haskell, MD;  Location: [place];  Service: Orthopedics;  Laterality: Left;  [place]    JOINT REPLACEMENT     LEFT HEART CATHETERIZATION WITH CORONARY ANGIOGRAM N/A 04/16/2014   Procedure: LEFT HEART CATHETERIZATION WITH CORONARY ANGIOGRAM;  Surgeon: Wellington Hampshire, MD;  Location: [place] CATH LAB;  Service: Cardiovascular;  Laterality: N/A;   NM MYOCAR PERF WALL MOTION  08/14/2009   Normal   PACEMAKER INSERTION  01/06/2009   [employer]   PPM GENERATOR CHANGEOUT N/A 01/24/2018   Procedure: PPM GENERATOR CHANGEOUT;  Surgeon: Sanda Klein, MD;  Location: [place] CV LAB;  Service: Cardiovascular;  Laterality: N/A;   TOTAL HIP ARTHROPLASTY     WRIST FRACTURE SURGERY  07/2012   left intra articular  with carpel tunnel   Social History:  reports that she has never smoked. She has never used smokeless tobacco. She reports that she does not drink alcohol and does not use drugs.  Allergies  Allergen Reactions   Colestipol Other (See Comments)    Muscle aches   Hydrocodone Itching   Niacin And Related Other (See Comments)    Muscle pain   Statins Other (See Comments)    Muscle aches on all she has tried.  She recalls Lipitor, Crestor, and Livalo but thinks there were others   Zetia [Ezetimibe] Other (See Comments)    Muscle aches    Family History  Problem Relation Age of Onset   Stroke Father     Prior to Admission medications   Medication Sig Start Date End Date Taking? Authorizing Provider  acetaminophen (TYLENOL) 500 MG tablet Take 500 mg by mouth every 6 (six) hours as needed for moderate pain.    [provider]  albuterol (PROVENTIL HFA;VENTOLIN HFA) 108 (90 Base) MCG/ACT inhaler Inhale 2 puffs into the lungs every 6 (six) hours as needed for wheezing or shortness of breath.    [provider]  bisacodyl (DULCOLAX) 5 MG EC  tablet Take 1 tablet (5 mg total) by mouth daily as needed for moderate constipation. 11/17/22   Johnson, Clanford L, MD  Cholecalciferol (VITAMIN D3 PO) Take 1 tablet by mouth daily.    [provider]  ELIQUIS 2.5 MG TABS tablet Take 1 tablet by mouth twice daily Patient taking differently: Take 2.5 mg by mouth 2 (two) times daily. 10/28/19   Croitoru, Mihai, MD  ferrous sulfate 325 (65 FE) MG tablet Take 1 tablet (325 mg total) by mouth every other day. 11/19/22   Johnson, Clanford L, MD  furosemide (LASIX) 20 MG tablet Take 1 tablet (20 mg total) by mouth every other day. 11/18/22   Murlean Iba, MD  gabapentin (NEURONTIN) 100 MG capsule Take 1 capsule (100 mg total) by mouth 2 (two) times daily as needed (neuropathy pain). 11/17/22   Johnson, Clanford L, MD  levothyroxine (SYNTHROID) 75 MCG tablet Take 1 tablet by mouth daily. 11/24/21   [provider]  metoprolol succinate (TOPROL-XL) 25 MG 24 hr tablet Take 1 tablet by mouth once daily Patient taking differently: Take 25 mg by mouth daily. 08/21/20   Croitoru, Mihai, MD  nitroGLYCERIN (NITROSTAT) 0.4 MG SL tablet Place 1 tablet (0.4 mg total) under the tongue every 5 (five) minutes x 3 doses as needed for chest pain. 11/17/22   Johnson, Clanford L, MD  omeprazole (PRILOSEC) 20 MG capsule Take 20 mg by mouth daily. 10/18/21   [provider]  Donnal Debar 200-62.5-25 MCG/ACT AEPB Inhale 1 puff into the lungs daily. 10/28/21   [provider]    Physical Exam: Vitals:   11/28/22 1900 11/28/22 1919 11/28/22 2044 11/28/22 2100  BP: (!) 126/49   118/68  Pulse: 66   (!) 59  Resp: 16   17  Temp:      TempSrc:      SpO2: 95% 97% 97% 97%  Weight:      Height:       General:  Appears calm and comfortable and is in NAD Eyes:  PERRL, EOMI, normal lids, iris ENT:  HOH, lips & tongue, dry mucous membranes; appropriate dentition Neck:  no LAD, masses or thyromegaly;no carotid bruits Cardiovascular:   RRR, +systolic murmur.  No LE edema.  Respiratory:   CTA bilaterally with no wheezes/rales/rhonchi.  Normal respiratory effort. Abdomen:  soft, NT, ND, NABS Back:   normal alignment, no CVAT Skin:  no rash or induration seen on limited exam Musculoskeletal:  grossly normal tone BUE/BLE, good ROM, no bony abnormality Lower extremity:  No LE edema.  Limited foot exam with no ulcerations.  2+ distal pulses. Psychiatric:  grossly normal mood and affect, speech fluent and appropriate, AOx3 Neurologic:  CN 2-12 grossly intact, moves all extremities in coordinated fashion, sensation intact   Radiological Exams on Admission: Independently reviewed - see discussion in A/P where applicable  DG Chest Port 1 View  Result Date: 11/28/2022 CLINICAL DATA:  Shortness of breath. EXAM: PORTABLE CHEST 1 VIEW COMPARISON:  November 11, 2022 FINDINGS: Mediastinal contour and cardiac silhouette are stable. Heart size is enlarged. Cardiac pacemaker is unchanged. Status post prior median sternotomy and CABG. Patchy consolidation of left lung base is identified with small left pleural effusion. The right lung is clear. Osseous structures are stable. IMPRESSION: Patchy consolidation of left lung base with small left pleural effusion. Pneumonia of left lung base is not excluded. Electronically Signed   By: Abelardo Diesel M.D.   On: 11/28/2022 15:06    EKG: Independently reviewed.  NSR with rate 86; nonspecific ST changes with no evidence of acute ischemia   Labs on Admission: I have personally reviewed the available labs and imaging studies at the time of the admission.  Pertinent labs:   covid positive,  BUN: 25,  creatinine: 1.50,  hgb: 10.2,   Assessment and Plan: Principal Problem:   Acute respiratory failure and sepsis due to COVID-19 [facility]) Active Problems:   Essential hypertension   Coronary artery disease involving native coronary artery of native heart without angina pectoris   Chronic diastolic heart  failure (HCC)   Paroxysmal atrial fibrillation (HCC)   Stage 3b chronic kidney disease (CKD) (HCC)   Aortic stenosis   Asthma,  chronic   SSS (sick sinus syndrome) (HCC)   GERD (gastroesophageal reflux disease)   Pneumonia due to COVID-19 virus   Hypothyroidism    Assessment and Plan: * Acute respiratory failure and sepsis due to COVID-69 [facility]) 89 year old female with 2 day history of fever, cough and profound weakness found to be hypoxic on room air to 84% in setting of covid 19 with possible pneumonia -obs to telemetry -droplet and airborne precautions with PPE for covid -covid labs -steroids with oxygen requirement -discussed treatment options and that remdesivir has poor studies for covid pneumonia, continue with supportive therapy  -pulmicort BID -sABA prn -anti tussive and vitamins  -IS to bedside -questionable patchy consolidation in left lung base. ? Pneumonia vs. Fluid. BNP and PCT pending. Given vanc/cefepime in ED for sepsis criteria, but holding antibiotics as sepsis likely from covid and can re-initiate if PCT elevated.  -Wean oxygen as tolerated  -PT/OT to assess  Essential hypertension Hypotensive, but responded to IVF bolus Continue metoprolol tomorrow, but hold if bp soft   Coronary artery disease involving native coronary artery of native heart without angina pectoris No chest pain or palpitations EKG with no acute changes Troponin pending in setting of covid Continue medical management with beta blocker, allergy to statins   Chronic diastolic heart failure ([place]) Euvolemic to dry on exam  Echo 10/2022: Normal EF with grade 1 DD and moderate to severe AS Strict I/O Continue medical management with toprol-xl and lasix every other day   Paroxysmal atrial fibrillation (HCC) Rate controlled in NSR Continue eliquis and toprol  Stage 3b chronic kidney disease (CKD) ([place]) Creatinine baseline around 1.5-1.6 Stable, continue to monitor   Aortic  stenosis Moderate to severe on recent echo this month  Gentle, time limited IVF so as not to overload her   Asthma, chronic No signs of exacerbation in setting of covid Continue trelegy SABA prn  SSS (sick sinus syndrome) ([place]) S/p PPM in 2010   GERD (gastroesophageal reflux disease) Continue PPI   Hypothyroidism TSH pending Continue home synthroid    Advance Care Planning:   Code Status: DNR   Consults: PT/OT  DVT Prophylaxis: eliquis   Family Communication: granddaughter at bedside   Severity of Illness: The appropriate patient status for this patient is OBSERVATION. Observation status is judged to be reasonable and necessary in order to provide the required intensity of service to ensure the patient's safety. The patient's presenting symptoms, physical exam findings, and initial radiographic and laboratory data in the context of their medical condition is felt to place them at decreased risk for further clinical deterioration. Furthermore, it is anticipated that the patient will be medically stable for discharge from the hospital within 2 midnights of admission.   Author: Orma Flaming, MD 11/28/2022 9:30 PM  For on call review [email or web address].

## 2022-11-28 NOTE — Assessment & Plan Note (Signed)
TSH pending Continue home synthroid

## 2022-11-28 NOTE — Assessment & Plan Note (Signed)
Moderate to severe on recent echo this month  Gentle, time limited IVF so as not to overload her

## 2022-11-28 NOTE — ED Notes (Signed)
Xray present at bedside.

## 2022-11-28 NOTE — Assessment & Plan Note (Signed)
Continue PPI ?

## 2022-11-28 NOTE — Assessment & Plan Note (Signed)
No chest pain or palpitations EKG with no acute changes Troponin pending in setting of covid Continue medical management with beta blocker, allergy to statins

## 2022-11-28 NOTE — ED Provider Notes (Signed)
[facility] Provider Note   CSN: [id number] Arrival date & time: 11/28/22  1331     History  Chief Complaint  Patient presents with   Shortness of Breath    Amy [name] is a 89 y.o. female with a past medical history of CHF presenting today with shortness of breath.  Of note she was discharged from the hospital 2 weeks ago with the same.  She reports that she was normal on Friday and Saturday but yesterday she began to feel very weak.  She developed a cough that was occasionally productive of clear sputum.  No recent travel, surgery, history of DVT/PE or hemoptysis.  Denies any chest pain or palpitations.  Feels nothing like previous ACS.  Reports feeling better with oxygen which she does not wear at baseline  According to EMS patient was satting at 84% on room air upon their arrival.   Shortness of Breath      Home Medications Prior to Admission medications   Medication Sig Start Date End Date Taking? Authorizing Provider  acetaminophen (TYLENOL) 500 MG tablet Take 500 mg by mouth every 6 (six) hours as needed for moderate pain.    [provider]  albuterol (PROVENTIL HFA;VENTOLIN HFA) 108 (90 Base) MCG/ACT inhaler Inhale 2 puffs into the lungs every 6 (six) hours as needed for wheezing or shortness of breath.    [provider]  bisacodyl (DULCOLAX) 5 MG EC tablet Take 1 tablet (5 mg total) by mouth daily as needed for moderate constipation. 11/17/22   Johnson, Clanford L, MD  Cholecalciferol (VITAMIN D3 PO) Take 1 tablet by mouth daily.    [provider]  ELIQUIS 2.5 MG TABS tablet Take 1 tablet by mouth twice daily Patient taking differently: Take 2.5 mg by mouth 2 (two) times daily. 10/28/19   Croitoru, Mihai, MD  ferrous sulfate 325 (65 FE) MG tablet Take 1 tablet (325 mg total) by mouth every other day. 11/19/22   Johnson, Clanford L, MD  furosemide (LASIX) 20 MG tablet Take 1 tablet (20 mg total) by  mouth every other day. 11/18/22   Johnson, Clanford L, MD  gabapentin (NEURONTIN) 100 MG capsule Take 1 capsule (100 mg total) by mouth 2 (two) times daily as needed (neuropathy pain). 11/17/22   Johnson, Clanford L, MD  levothyroxine (SYNTHROID) 75 MCG tablet Take 1 tablet by mouth daily. 11/24/21   [provider]  metoprolol succinate (TOPROL-XL) 25 MG 24 hr tablet Take 1 tablet by mouth once daily Patient taking differently: Take 25 mg by mouth daily. 08/21/20   Croitoru, Mihai, MD  nitroGLYCERIN (NITROSTAT) 0.4 MG SL tablet Place 1 tablet (0.4 mg total) under the tongue every 5 (five) minutes x 3 doses as needed for chest pain. 11/17/22   Johnson, Clanford L, MD  omeprazole (PRILOSEC) 20 MG capsule Take 20 mg by mouth daily. 10/18/21   [provider]  Donnal Debar 200-62.5-25 MCG/ACT AEPB Inhale 1 puff into the lungs daily. 10/28/21   [provider]      Allergies    Colestipol, Hydrocodone, Niacin and related, Statins, and Zetia [ezetimibe]    Review of Systems   Review of Systems  Respiratory:  Positive for shortness of breath.     Physical Exam Updated Vital Signs BP (!) 149/60 (BP Location: Left Arm)   Pulse 79   Temp (!) 100.4 F (38 C) (Oral)   Resp 20   SpO2 97%  Physical Exam  Vitals and nursing note reviewed.  Constitutional:      General: She is not in acute distress.    Appearance: Normal appearance. She is not ill-appearing.  HENT:     Head: Normocephalic and atraumatic.     Mouth/Throat:     Comments: Dry mm Eyes:     General: No scleral icterus.    Conjunctiva/sclera: Conjunctivae normal.  Cardiovascular:     Rate and Rhythm: Normal rate. Rhythm irregular.  Pulmonary:     Effort: Pulmonary effort is normal. No respiratory distress.     Breath sounds: No decreased breath sounds.  Musculoskeletal:     Right lower leg: No edema.     Left lower leg: No edema.  Skin:    Findings: No rash.  Neurological:     Mental Status: She  is alert.  Psychiatric:        Mood and Affect: Mood normal.     ED Results / Procedures / Treatments   Labs (all labs ordered are listed, but only abnormal results are displayed) Labs Reviewed  RESP PANEL BY RT-PCR (RSV, FLU A&B, COVID)  RVPGX2 - Abnormal; Notable for the following components:      Result Value   SARS Coronavirus 2 by RT PCR POSITIVE (*)    All other components within normal limits  COMPREHENSIVE METABOLIC PANEL - Abnormal; Notable for the following components:   BUN 25 (*)    Creatinine, Ser 1.50 (*)    Total Protein 6.2 (*)    Albumin 3.0 (*)    GFR, Estimated 31 (*)    All other components within normal limits  CBC - Abnormal; Notable for the following components:   Hemoglobin 10.2 (*)    HCT 33.9 (*)    RDW 18.0 (*)    All other components within normal limits  RESP PANEL BY RT-PCR (RSV, FLU A&B, COVID)  RVPGX2  CULTURE, BLOOD (ROUTINE X 2)  CULTURE, BLOOD (ROUTINE X 2)  LACTIC ACID, PLASMA  LACTIC ACID, PLASMA    EKG None  Radiology DG Chest Port 1 View  Result Date: 11/28/2022 CLINICAL DATA:  Shortness of breath. EXAM: PORTABLE CHEST 1 VIEW COMPARISON:  November 11, 2022 FINDINGS: Mediastinal contour and cardiac silhouette are stable. Heart size is enlarged. Cardiac pacemaker is unchanged. Status post prior median sternotomy and CABG. Patchy consolidation of left lung base is identified with small left pleural effusion. The right lung is clear. Osseous structures are stable. IMPRESSION: Patchy consolidation of left lung base with small left pleural effusion. Pneumonia of left lung base is not excluded. Electronically Signed   By: Abelardo Diesel M.D.   On: 11/28/2022 15:06    Procedures Procedures   Medications Ordered in ED Medications  acetaminophen (TYLENOL) tablet 1,000 mg (1,000 mg Oral Given 11/28/22 1438)    ED Course/ Medical Decision Making/ A&P Clinical Course as of 11/28/22 1623  Mon Nov 28, 2022  1525 DG Chest [place] 1 View [AH]     Clinical Course User Index [AH] Rica Koyanagi                             Medical Decision Making Amount and/or Complexity of Data Reviewed Labs: ordered. Radiology: ordered.  Risk OTC drugs. Decision regarding hospitalization.   89 year old female presenting today with shortness of breath. Going on for the past 2 days.  Differential includes but is not limited to PE, pneumonia, pleural effusion, viral illness,  ACS, dissection.  this is not an exhaustive differential.    Past Medical History / Co-morbidities / Social History: CAD status post CABG x 3, hypertension, hypothyroidism, A-fib on Eliquis   Additional history: I reviewed patient's last hospitalization.  She was discharged 10 days ago for hypoxia in the setting of fluid overload and edema.  He was treated with IV Lasix and ultimately discharged home.   Physical Exam: Pertinent physical exam findings include Relatively well-appearing, dry MM A-fib auscultated and on monitor  Lab Tests: I ordered, and personally interpreted labs.  The pertinent results include: Relatively unremarkable, COVID-positive   Imaging Studies: I ordered and independently visualized and interpreted chest x-ray and I agree with the radiologist that there is likely a small pleural effusion.  Radiology questions pneumonia as wel.    Cardiac Monitoring:  The patient was maintained on a cardiac monitor.  I viewed and interpreted the cardiac monitored which showed an underlying rhythm of: A-fib with a normal rate   Medications: Tylenol for fever.  Also given supplemental O2.  Requires 3 L to remain above 91%.    MDM/Disposition: This is a 89 year old female recently discharged from the hospital for hypoxia in the setting of pulmonary edema presenting today with shortness of breath and hypoxia.  Reports has been going on for around the last 24 hours.  No recent travel, surgery, history of DVT/PE or hemoptysis.  Does report a  cough occasionally productive of clear sputum.  Does not appear fluid overloaded on my physical exam.  Chest x-ray without pulmonary edema but with potential pneumonia.  She also tested positive for COVID.  Will be admitted for hypoxia secondary to COVID-19.  Nursing staff started to note hypotensive blood pressures.  In the setting of fever, tachypnea and hypotensive readings I will go ahead and start antibiotics for presumed bacterial pneumonia.  Potentially picked up during her hospitalization.  Also could be a viral pneumonia secondary to COVID-19 however x-ray is less suspicious for this etiology.  Fluids also ordered at this time.  Dr. Rogers Blocker to admit the patient.   Final Clinical Impression(s) / ED Diagnoses Final diagnoses:  Hypoxia  COVID-19    Rx / DC Orders ED Discharge Orders     None      Admit to Dr. Dortha Schwalbe, [place], PA-C 11/28/22 1745    Milton Ferguson, MD 11/29/22 [phone]

## 2022-11-29 DIAGNOSIS — I4892 Unspecified atrial flutter: Secondary | ICD-10-CM | POA: Diagnosis not present

## 2022-11-29 DIAGNOSIS — I700 Atherosclerosis of aorta: Secondary | ICD-10-CM | POA: Diagnosis present

## 2022-11-29 DIAGNOSIS — R0902 Hypoxemia: Secondary | ICD-10-CM | POA: Diagnosis present

## 2022-11-29 DIAGNOSIS — N1832 Chronic kidney disease, stage 3b: Secondary | ICD-10-CM | POA: Diagnosis present

## 2022-11-29 DIAGNOSIS — J1282 Pneumonia due to coronavirus disease 2019: Secondary | ICD-10-CM | POA: Diagnosis present

## 2022-11-29 DIAGNOSIS — I5032 Chronic diastolic (congestive) heart failure: Secondary | ICD-10-CM | POA: Diagnosis present

## 2022-11-29 DIAGNOSIS — J9601 Acute respiratory failure with hypoxia: Secondary | ICD-10-CM | POA: Diagnosis present

## 2022-11-29 DIAGNOSIS — Z885 Allergy status to narcotic agent status: Secondary | ICD-10-CM | POA: Diagnosis not present

## 2022-11-29 DIAGNOSIS — I350 Nonrheumatic aortic (valve) stenosis: Secondary | ICD-10-CM | POA: Diagnosis present

## 2022-11-29 DIAGNOSIS — I25110 Atherosclerotic heart disease of native coronary artery with unstable angina pectoris: Secondary | ICD-10-CM | POA: Diagnosis not present

## 2022-11-29 DIAGNOSIS — Z888 Allergy status to other drugs, medicaments and biological substances status: Secondary | ICD-10-CM | POA: Diagnosis not present

## 2022-11-29 DIAGNOSIS — E7800 Pure hypercholesterolemia, unspecified: Secondary | ICD-10-CM | POA: Diagnosis present

## 2022-11-29 DIAGNOSIS — E039 Hypothyroidism, unspecified: Secondary | ICD-10-CM | POA: Diagnosis present

## 2022-11-29 DIAGNOSIS — A4189 Other specified sepsis: Secondary | ICD-10-CM | POA: Diagnosis present

## 2022-11-29 DIAGNOSIS — K219 Gastro-esophageal reflux disease without esophagitis: Secondary | ICD-10-CM | POA: Diagnosis present

## 2022-11-29 DIAGNOSIS — R6521 Severe sepsis with septic shock: Secondary | ICD-10-CM | POA: Diagnosis present

## 2022-11-29 DIAGNOSIS — Z950 Presence of cardiac pacemaker: Secondary | ICD-10-CM | POA: Diagnosis not present

## 2022-11-29 DIAGNOSIS — Z951 Presence of aortocoronary bypass graft: Secondary | ICD-10-CM | POA: Diagnosis not present

## 2022-11-29 DIAGNOSIS — D509 Iron deficiency anemia, unspecified: Secondary | ICD-10-CM | POA: Diagnosis present

## 2022-11-29 DIAGNOSIS — Z66 Do not resuscitate: Secondary | ICD-10-CM | POA: Diagnosis present

## 2022-11-29 DIAGNOSIS — I130 Hypertensive heart and chronic kidney disease with heart failure and stage 1 through stage 4 chronic kidney disease, or unspecified chronic kidney disease: Secondary | ICD-10-CM | POA: Diagnosis present

## 2022-11-29 DIAGNOSIS — S8391XD Sprain of unspecified site of right knee, subsequent encounter: Secondary | ICD-10-CM | POA: Diagnosis not present

## 2022-11-29 DIAGNOSIS — J9600 Acute respiratory failure, unspecified whether with hypoxia or hypercapnia: Secondary | ICD-10-CM | POA: Diagnosis not present

## 2022-11-29 DIAGNOSIS — I252 Old myocardial infarction: Secondary | ICD-10-CM | POA: Diagnosis not present

## 2022-11-29 DIAGNOSIS — F419 Anxiety disorder, unspecified: Secondary | ICD-10-CM | POA: Diagnosis present

## 2022-11-29 DIAGNOSIS — I495 Sick sinus syndrome: Secondary | ICD-10-CM | POA: Diagnosis present

## 2022-11-29 DIAGNOSIS — N183 Chronic kidney disease, stage 3 unspecified: Secondary | ICD-10-CM | POA: Diagnosis not present

## 2022-11-29 DIAGNOSIS — U071 COVID-19: Secondary | ICD-10-CM | POA: Diagnosis present

## 2022-11-29 DIAGNOSIS — I480 Paroxysmal atrial fibrillation: Secondary | ICD-10-CM | POA: Diagnosis present

## 2022-11-29 DIAGNOSIS — I272 Pulmonary hypertension, unspecified: Secondary | ICD-10-CM | POA: Diagnosis present

## 2022-11-29 LAB — CBC WITH DIFFERENTIAL/PLATELET
Abs Immature Granulocytes: 0.02 10*3/uL (ref 0.00–0.07)
Basophils Absolute: 0 10*3/uL (ref 0.0–0.1)
Basophils Relative: 1 %
Eosinophils Absolute: 0 10*3/uL (ref 0.0–0.5)
Eosinophils Relative: 0 %
HCT: 36.2 % (ref 36.0–46.0)
Hemoglobin: 10.9 g/dL — ABNORMAL LOW (ref 12.0–15.0)
Immature Granulocytes: 1 %
Lymphocytes Relative: 15 %
Lymphs Abs: 0.6 10*3/uL — ABNORMAL LOW (ref 0.7–4.0)
MCH: 26.1 pg (ref 26.0–34.0)
MCHC: 30.1 g/dL (ref 30.0–36.0)
MCV: 86.8 fL (ref 80.0–100.0)
Monocytes Absolute: 0.1 10*3/uL (ref 0.1–1.0)
Monocytes Relative: 3 %
Neutro Abs: 3.5 10*3/uL (ref 1.7–7.7)
Neutrophils Relative %: 80 %
Platelets: 195 10*3/uL (ref 150–400)
RBC: 4.17 MIL/uL (ref 3.87–5.11)
RDW: 18 % — ABNORMAL HIGH (ref 11.5–15.5)
WBC: 4.2 10*3/uL (ref 4.0–10.5)
nRBC: 0 % (ref 0.0–0.2)

## 2022-11-29 LAB — COMPREHENSIVE METABOLIC PANEL
ALT: 14 U/L (ref 0–44)
AST: 31 U/L (ref 15–41)
Albumin: 2.9 g/dL — ABNORMAL LOW (ref 3.5–5.0)
Alkaline Phosphatase: 89 U/L (ref 38–126)
Anion gap: 11 (ref 5–15)
BUN: 24 mg/dL — ABNORMAL HIGH (ref 8–23)
CO2: 23 mmol/L (ref 22–32)
Calcium: 8.6 mg/dL — ABNORMAL LOW (ref 8.9–10.3)
Chloride: 103 mmol/L (ref 98–111)
Creatinine, Ser: 1.31 mg/dL — ABNORMAL HIGH (ref 0.44–1.00)
GFR, Estimated: 37 mL/min — ABNORMAL LOW (ref >60)
Glucose, Bld: 149 mg/dL — ABNORMAL HIGH (ref 70–99)
Potassium: 3.9 mmol/L (ref 3.5–5.1)
Sodium: 137 mmol/L (ref 135–145)
Total Bilirubin: 0.5 mg/dL (ref 0.3–1.2)
Total Protein: 6.1 g/dL — ABNORMAL LOW (ref 6.5–8.1)

## 2022-11-29 LAB — C-REACTIVE PROTEIN: CRP: 2.2 mg/dL — ABNORMAL HIGH (ref <1.0)

## 2022-11-29 MED ORDER — FERROUS SULFATE 325 (65 FE) MG PO TABS
325.0000 mg | ORAL_TABLET | Freq: Every day | ORAL | Status: DC
  Administered 2022-11-30 – 2022-12-01 (×2): 325 mg via ORAL
  Filled 2022-11-29 (×2): qty 1

## 2022-11-29 MED ORDER — FUROSEMIDE 40 MG PO TABS
20.0000 mg | ORAL_TABLET | Freq: Every day | ORAL | Status: DC
  Administered 2022-11-30 – 2022-12-01 (×2): 20 mg via ORAL
  Filled 2022-11-29 (×2): qty 1

## 2022-11-29 NOTE — Evaluation (Signed)
Physical Therapy Evaluation Patient Details Name: Amy M Bundick MRN: [id number] DOB: 1921/11/06 Today's Date: 11/29/2022  History of Present Illness  Amy [name] is a 89 y.o. female with medical history significant of  asthma, paroxysmal atrial fibrillation on Eliquis, GERD, CAD s/p CABG x 3, hypertension, hypothyroidism, CKD stage 3b, SSS s/p PPM, chronic diastolic CHF who presented with complaints of shortness of breath. Yesterday she started to feel bad and had a temperature. She was nauseated last night.  This morning she didn't eat breakfast, but it tasted horrible so she went back to bed. Her nurse came and got her up and she was too weak to stand up and got back to the bed.  Her nurse called EMS due to the weakness and low oxygen to 84% on RA. She does have a cough that is mainly dry. If she does have a productive cough it is clear mucous. She has no congestion or sinus pain/pressure. No headaches or new body aches. No swelling in her legs. She was lightheaded this morning. Denies any sick contacts. She does have some dyspnea on exertion, but none at rest. She has no chest pain.   Clinical Impression  Patient demonstrates slow labored movement for sitting up at bedside, able to transfer to [facility] with labored movement, once seated on commode patient had episode of tremors in all extremities with c/o feeling cold and unable to attempt ambulation.  Patient put back to bed with Mod assist to reposition due to weakness.  Nursing staff aware patient having episodes of tremors with c/o of feeling cold.  Patient will benefit from continued skilled physical therapy in hospital and recommended venue below to increase strength, balance, endurance for safe ADLs and gait.         Recommendations for follow up therapy are one component of a multi-disciplinary discharge planning process, led by the attending physician.  Recommendations may be updated based on patient status, additional functional criteria and  insurance authorization.  Follow Up Recommendations Skilled nursing-short term rehab (<3 hours/day) Can patient physically be transported by private vehicle: Yes    Assistance Recommended at Discharge Set up Supervision/Assistance  Patient can return home with the following  A little help with walking and/or transfers;A little help with bathing/dressing/bathroom;Help with stairs or ramp for entrance;Assistance with cooking/housework    Equipment Recommendations None recommended by PT  Recommendations for Other Services       Functional Status Assessment Patient has had a recent decline in their functional status and demonstrates the ability to make significant improvements in function in a reasonable and predictable amount of time.     Precautions / Restrictions Precautions Precautions: Fall Restrictions Weight Bearing Restrictions: No      Mobility  Bed Mobility Overal bed mobility: Needs Assistance Bed Mobility: Supine to Sit, Sit to Supine     Supine to sit: Min assist Sit to supine: Min assist, Mod assist   General bed mobility comments: slow labored movement, diffiuclty reposition self when put back to bed    Transfers Overall transfer level: Needs assistance Equipment used: 1 person hand held assist Transfers: Sit to/from Stand, Bed to chair/wheelchair/BSC Sit to Stand: Min assist   Step pivot transfers: Min assist, Mod assist       General transfer comment: increased time, labored movement    Ambulation/Gait Ambulation/Gait assistance: Min assist, Mod assist Gait Distance (Feet): 3 Feet Assistive device: 1 person hand held assist Gait Pattern/deviations: Decreased step length - right, Decreased step length -  left, Decreased stride length, Scissoring Gait velocity: slow     General Gait Details: limited to a few having to lean on armrest of BSC due to generalized weakness and severe episode of tremors  Stairs            Wheelchair Mobility     Modified Rankin (Stroke Patients Only)       Balance Overall balance assessment: Needs assistance Sitting-balance support: Feet supported, No upper extremity supported Sitting balance-Leahy Scale: Fair Sitting balance - Comments: fair/good seated at EOB   Standing balance support: During functional activity, Bilateral upper extremity supported Standing balance-Leahy Scale: Poor Standing balance comment: leaning on armrest of BSC, hand held assist                             Pertinent Vitals/Pain Pain Assessment Pain Assessment: No/denies pain    Home Living Family/patient expects to be discharged to:: Private residence Living Arrangements: Children Available Help at Discharge: Family;Personal care attendant;Available 24 hours/day Type of Home: House Home Access: Ramped entrance       Home Layout: One level;Able to live on main level with bedroom/bathroom;Laundry or work area in basement;Full bath on main level Home Equipment: Rolling Walker (2 wheels);Rollator (4 wheels);BSC/3in1;Cane - single point;Shower seat;Grab bars - tub/shower;Wheelchair - manual Additional Comments: 6    Prior Function Prior Level of Function : Needs assist       Physical Assist : Mobility (physical);ADLs (physical) Mobility (physical): Bed mobility;Transfers;Gait;Stairs   Mobility Comments: Supervised short household distances using RW ADLs Comments: assisted by family and caregivers     Hand Dominance   Dominant Hand: Right    Extremity/Trunk Assessment   Upper Extremity Assessment Upper Extremity Assessment: Generalized weakness    Lower Extremity Assessment Lower Extremity Assessment: Generalized weakness    Cervical / Trunk Assessment Cervical / Trunk Assessment: Normal  Communication   Communication: No difficulties  Cognition Arousal/Alertness: Awake/alert Behavior During Therapy: WFL for tasks assessed/performed Overall Cognitive Status: Within Functional  Limits for tasks assessed                                          General Comments      Exercises     Assessment/Plan    PT Assessment Patient needs continued PT services  PT Problem List Decreased activity tolerance;Decreased balance;Decreased mobility       PT Treatment Interventions DME instruction;Gait training;Functional mobility training;Therapeutic activities;Therapeutic exercise;Balance training;Patient/family education;Stair training    PT Goals (Current goals can be found in the Care Plan section)  Acute Rehab PT Goals Patient Stated Goal: return home with family and caregivers to assist PT Goal Formulation: With patient/family Time For Goal Achievement: 12/13/22 Potential to Achieve Goals: Good    Frequency Min 3X/week     Co-evaluation               AM-PAC PT "6 Clicks" Mobility  Outcome Measure Help needed turning from your back to your side while in a flat bed without using bedrails?: A Little Help needed moving from lying on your back to sitting on the side of a flat bed without using bedrails?: A Little Help needed moving to and from a bed to a chair (including a wheelchair)?: A Lot Help needed standing up from a chair using your arms (e.g., wheelchair or bedside chair)?: A Lot  Help needed to walk in hospital room?: A Lot Help needed climbing 3-5 steps with a railing? : A Lot 6 Click Score: 14    End of Session   Activity Tolerance: Patient tolerated treatment well;Patient limited by fatigue Patient left: in bed;with call bell/phone within reach;with family/visitor present Nurse Communication: Mobility status PT Visit Diagnosis: Unsteadiness on feet (R26.81);Other abnormalities of gait and mobility (R26.89);Muscle weakness (generalized) (M62.81)    Time: 9359-4090 PT Time Calculation (min) (ACUTE ONLY): 30 min   Charges:   PT Evaluation $PT Eval Moderate Complexity: 1 Mod PT Treatments $Therapeutic Activity: 23-37 mins         2:17 PM, 11/29/22 Lonell Grandchild, MPT Physical Therapist with [facility] [phone] office [phone] mobile phone

## 2022-11-29 NOTE — Progress Notes (Addendum)
PROGRESS NOTE     Amy [name], is a 89 y.o. female, DOB - [birth year], BVQ:[id number]  Admit date - 11/28/2022   Admitting Physician Clark Cuff Denton Brick, MD  Outpatient Primary MD for the patient is [facility]  LOS - 0  Chief Complaint  Patient presents with   Shortness of Breath        Brief Narrative:  89 y.o. female with medical history significant of asthma, paroxysmal atrial fibrillation on Eliquis, GERD, CAD s/p CABG x 3, CKD stage IIIb,  hypertension, hypothyroidism , SSS s/p PPM, chronic diastolic CHF admitted on 8/00/3491 with acute hypoxic respiratory failure secondary to COVID-19 infection      -Assessment and Plan: 1) acute hypoxic respiratory failure secondary to COVID-19 infection-- Currently weaned down to 2 L of oxygen from 4 L of oxygen -Continue steroids, mucolytics and bronchodilators -Continue zinc and ascorbic acid COVID-19 Labs  Recent Labs    11/28/22 1436  DDIMER 1.22*  FERRITIN 36  CRP 2.2*    2) chronic diastolic dysfunction CHF exacerbation--echo on 11/12/2022 with EF of 55 to 60% with grade 1 diastolic dysfunction and moderate pulmonary hypertension, with moderate to severe aortic stenosis -Continue metoprolol -Restart Lasix at 20 mg daily -Be careful not to significantly lower afterload in the setting of moderate to severe aortic stenosis   3)Moderate to severe aortic stenosis--probably contributory to #1 above -Be careful not to significantly lower afterload in the setting of moderate to severe aortic stenosis -Diuresis as above #1 -Patient has longstanding history of recurrent syncopal episodes   4)GERD--- PPI   5))Iron deficiency anemia -BP stable, monitor closely while on Eliquis Continue ferrous sulfate   6)CKD stage IIIb -Creatinine is actually slightly lower than recent baseline -Monitor closely while on Lasix Renally adjust medications, avoid nephrotoxic agents/dehydration/hypotension   7)Paroxysmal atrial  fibrillation/SSS -s/p PPM in 2010 -stable Continue Eliquis for stroke prophylaxis Continue Toprol-XL for rate control   8)Essential hypertension Continue Toprol-XL and Lasix  9)Acquired hypothyroidism Continue Synthroid  10)Generalized weakness and fall risk- -PT recommending  SNF Rehab, -Prior to admission patient was working to private pay for assisted living at [place] working on possible transfer to SNF rehab after COVID waiting period is over   Status is: Inpatient    Disposition: The patient is from: Home              Anticipated d/c is to: SNF              Anticipated d/c date is: 2 days              Patient currently is not medically stable to d/c. Barriers: Not Clinically Stable-    Code Status :  -  Code Status: DNR    Family Communication:   Daughter Francie Massing is primary contact   DVT Prophylaxis  :   - SCDs   apixaban (ELIQUIS) tablet 2.5 mg Start: 11/28/22 2200 apixaban (ELIQUIS) tablet 2.5 mg   Lab Results  Component Value Date   PLT 195 11/29/2022    Inpatient Medications  Scheduled Meds:  albuterol  2.5 mg Nebulization TID   apixaban  2.5 mg Oral BID   vitamin C  500 mg Oral Daily   fluticasone furoate-vilanterol  1 puff Inhalation Daily   And   umeclidinium bromide  1 puff Inhalation Daily   levothyroxine  75 mcg Oral Q0600   methylPREDNISolone (SOLU-MEDROL) injection  75 mg Intravenous Q12H   Followed by   Derrill Memo ON 12/02/2022]  predniSONE  50 mg Oral Daily   metoprolol succinate  25 mg Oral Daily   pantoprazole  40 mg Oral Daily   zinc sulfate  220 mg Oral Daily   Continuous Infusions: PRN Meds:.acetaminophen **OR** acetaminophen, albuterol, guaiFENesin-dextromethorphan, senna-docusate   Anti-infectives (From admission, onward)    Start     Dose/Rate Route Frequency Ordered Stop   11/30/22 1800  vancomycin (VANCOCIN) IVPB 1000 mg/200 mL premix  Status:  Discontinued        1,000 mg 200 mL/hr over 60 Minutes Intravenous Every 48  hours 11/28/22 1811 11/28/22 1920   11/28/22 1800  vancomycin (VANCOREADY) IVPB 1500 mg/300 mL        1,500 mg 150 mL/hr over 120 Minutes Intravenous  Once 11/28/22 1748 11/28/22 2012   11/28/22 1800  ceFEPIme (MAXIPIME) 2 g in sodium chloride 0.9 % 100 mL IVPB        2 g 200 mL/hr over 30 Minutes Intravenous  Once 11/28/22 1748 11/28/22 2057   11/28/22 1745  ceFEPIme (MAXIPIME) 1 g in sodium chloride 0.9 % 100 mL IVPB  Status:  Discontinued        1 g 200 mL/hr over 30 Minutes Intravenous  Once 11/28/22 1743 11/28/22 1748         Subjective: Amy [name] today has no fevers, no emesis,  No chest pain,   - Cough and congestion persist -Unable to wean off oxygen completely , but  was able to reduce her oxygen requirement to 2 L per nasal cannula at this time   Objective: Vitals:   11/29/22 1341 11/29/22 1352 11/29/22 1520 11/29/22 1920  BP: (!) 149/63  (!) 106/44   Pulse: 77  98 78  Resp: (!) '22  20 20  '$ Temp: 98.1 F (36.7 C)     TempSrc: Oral     SpO2: 96% 96% 98% 96%  Weight:      Height:        Intake/Output Summary (Last 24 hours) at 11/29/2022 1950 Last data filed at 11/29/2022 1700 Gross per 24 hour  Intake 2100 ml  Output 400 ml  Net 1700 ml   Filed Weights   11/28/22 1813  Weight: 72.6 kg    Physical Exam Gen:- Awake Alert,  in no apparent distress  HEENT:- [place].AT, No sclera icterus Nose- Central 2L/min Neck-Supple Neck,No JVD,.  Lungs-diminished sounds with a few scattered wheezes CV- S1, S2 normal, regular, 3/6 SM  Abd-  +ve B.Sounds, Abd Soft, No tenderness,  No CVA tenderness Extremity/Skin:- No significant  edema, pedal pulses present  Psych-affect is appropriate, oriented x3 Neuro-Generalized weakness, no new focal deficits, no tremors  Data Reviewed: I have personally reviewed following labs and imaging studies  CBC: Recent Labs  Lab 11/28/22 1436 11/29/22 0437  WBC 5.9 4.2  NEUTROABS  --  3.5  HGB 10.2* 10.9*  HCT 33.9* 36.2  MCV 86.7 86.8   PLT 214 163   Basic Metabolic Panel: Recent Labs  Lab 11/28/22 1436 11/28/22 1948 11/29/22 0437  NA 135  --  137  K 4.0  --  3.9  CL 98  --  103  CO2 29  --  23  GLUCOSE 94  --  149*  BUN 25*  --  24*  CREATININE 1.50*  --  1.31*  CALCIUM 9.0  --  8.6*  MG  --  2.0  --    GFR: Estimated Creatinine Clearance: 23.4 mL/min (A) (by C-G formula based on SCr of 1.31  mg/dL (H)). Liver Function Tests: Recent Labs  Lab 11/28/22 1436 11/29/22 0437  AST 27 31  ALT 13 14  ALKPHOS 81 89  BILITOT 0.6 0.5  PROT 6.2* 6.1*  ALBUMIN 3.0* 2.9*   Cardiac Enzymes: No results for input(s): "CKTOTAL", "CKMB", "CKMBINDEX", "TROPONINI" in the last 168 hours. BNP (last 3 results) No results for input(s): "PROBNP" in the last 8760 hours. HbA1C: No results for input(s): "HGBA1C" in the last 72 hours. Sepsis Labs: '@LABRCNTIP'$ (procalcitonin:4,lacticidven:4) ) Recent Results (from the past 240 hour(s))  Resp panel by RT-PCR (RSV, Flu A&B, Covid) Anterior Nasal Swab     Status: Abnormal   Collection Time: 11/28/22  2:12 PM   Specimen: Anterior Nasal Swab  Result Value Ref Range Status   SARS Coronavirus 2 by RT PCR POSITIVE (A) NEGATIVE Final    Comment: (NOTE) SARS-CoV-2 target nucleic acids are DETECTED.  The SARS-CoV-2 RNA is generally detectable in upper respiratory specimens during the acute phase of infection. Positive results are indicative of the presence of the identified virus, but do not rule out bacterial infection or co-infection with other pathogens not detected by the test. Clinical correlation with patient history and other diagnostic information is necessary to determine patient infection status. The expected result is Negative.  Fact Sheet for Patients: EntrepreneurPulse.com.au  Fact Sheet for Healthcare Providers: IncredibleEmployment.be  This test is not yet approved or cleared by the [place] FDA and  has been authorized  for detection and/or diagnosis of SARS-CoV-2 by FDA under an Emergency Use Authorization (EUA).  This EUA will remain in effect (meaning this test can be used) for the duration of  the COVID-19 declaration under Section 564(b)(1) of the A ct, 21 U.S.C. section 360bbb-3(b)(1), unless the authorization is terminated or revoked sooner.     Influenza A by PCR NEGATIVE NEGATIVE Final   Influenza B by PCR NEGATIVE NEGATIVE Final    Comment: (NOTE) The Xpert Xpress SARS-CoV-2/FLU/RSV plus assay is intended as an aid in the diagnosis of influenza from Nasopharyngeal swab specimens and should not be used as a sole basis for treatment. Nasal washings and aspirates are unacceptable for Xpert Xpress SARS-CoV-2/FLU/RSV testing.  Fact Sheet for Patients: EntrepreneurPulse.com.au  Fact Sheet for Healthcare Providers: IncredibleEmployment.be  This test is not yet approved or cleared by the [place] FDA and has been authorized for detection and/or diagnosis of SARS-CoV-2 by FDA under an Emergency Use Authorization (EUA). This EUA will remain in effect (meaning this test can be used) for the duration of the COVID-19 declaration under Section 564(b)(1) of the Act, 21 U.S.C. section 360bbb-3(b)(1), unless the authorization is terminated or revoked.     Resp Syncytial Virus by PCR NEGATIVE NEGATIVE Final    Comment: (NOTE) Fact Sheet for Patients: EntrepreneurPulse.com.au  Fact Sheet for Healthcare Providers: IncredibleEmployment.be  This test is not yet approved or cleared by the [place] FDA and has been authorized for detection and/or diagnosis of SARS-CoV-2 by FDA under an Emergency Use Authorization (EUA). This EUA will remain in effect (meaning this test can be used) for the duration of the COVID-19 declaration under Section 564(b)(1) of the Act, 21 U.S.C. section 360bbb-3(b)(1), unless the authorization is  terminated or revoked.  Performed at [facility], [address] [place], [place] [address]       Radiology Studies: [facility] 1 View  Result Date: 11/28/2022 CLINICAL DATA:  Shortness of breath. EXAM: PORTABLE CHEST 1 VIEW COMPARISON:  November 11, 2022 FINDINGS: Mediastinal contour and cardiac  silhouette are stable. Heart size is enlarged. Cardiac pacemaker is unchanged. Status post prior median sternotomy and CABG. Patchy consolidation of left lung base is identified with small left pleural effusion. The right lung is clear. Osseous structures are stable. IMPRESSION: Patchy consolidation of left lung base with small left pleural effusion. Pneumonia of left lung base is not excluded. Electronically Signed   By: Abelardo Diesel M.D.   On: 11/28/2022 15:06     Scheduled Meds:  albuterol  2.5 mg Nebulization TID   apixaban  2.5 mg Oral BID   vitamin C  500 mg Oral Daily   fluticasone furoate-vilanterol  1 puff Inhalation Daily   And   umeclidinium bromide  1 puff Inhalation Daily   levothyroxine  75 mcg Oral Q0600   methylPREDNISolone (SOLU-MEDROL) injection  75 mg Intravenous Q12H   Followed by   Derrill Memo ON 12/02/2022] predniSONE  50 mg Oral Daily   metoprolol succinate  25 mg Oral Daily   pantoprazole  40 mg Oral Daily   zinc sulfate  220 mg Oral Daily   Continuous Infusions:   LOS: 0 days    Roxan Hockey M.D on 11/29/2022 at 7:50 PM  Go to [email or web address] - for contact info  [facility] - Office  [phone]  If 7PM-7AM, please contact night-coverage [email or web address] 11/29/2022, 7:50 PM

## 2022-11-29 NOTE — TOC Initial Note (Signed)
Transition of Care [facility]) - Initial/Assessment Note    Patient Details  Name: Amy [name] MRN: [id number] Date of Birth: June 17, 1922  Transition of Care [facility]) CM/SW Contact:    Salome Arnt, [place] Phone Number: 11/29/2022, 12:10 PM  Clinical Narrative: Pt admitted due to acute respiratory failure and sepsis due to COVID-19. Assessment completed with pt's daughter, Juliann Pulse. Pt has around the clock care at home with caregivers. However, some of the caregivers are also sick with COVID. PT evaluated pt and recommend SNF. Pt's daughter is agreeable. First preference in [facility], then [facility]. She is also open to [place]. Reviewed Medicare.gov ratings. TOC will initiate bed search.                  Expected Discharge Plan: Skilled Nursing Facility Barriers to Discharge: Continued Medical Work up   Patient Goals and CMS Choice Patient states their goals for this hospitalization and ongoing recovery are:: short term rehab   Choice offered to / list presented to : Adult [place] ownership interest in [facility].provided to:: Adult Children    Expected Discharge Plan and Services In-house Referral: Clinical Social Work   Post Acute Care Choice: [place] Living arrangements for the past 2 months: [place]                                      Prior Living Arrangements/Services Living arrangements for the past 2 months: Single Family Home Lives with:: Self Patient language and need for interpreter reviewed:: Yes        Need for Family Participation in Patient Care: Yes (Comment) Care giver support system in place?: Yes (comment)   Criminal Activity/Legal Involvement Pertinent to Current Situation/Hospitalization: No - Comment as needed  Activities of Daily Living      Permission Sought/Granted                  Emotional Assessment         Alcohol / Substance Use: Not Applicable Psych Involvement: No  (comment)  Admission diagnosis:  Hypoxia [R09.02] Pneumonia due to COVID-19 virus [U07.1, J12.82] COVID-19 [U07.1] Patient Active Problem List   Diagnosis Date Noted   Pneumonia due to COVID-19 virus 11/28/2022   Aortic stenosis 11/28/2022   Acute respiratory failure and sepsis due to COVID-19 [facility]) 11/28/2022   Hypothyroidism 11/28/2022   Urinary tract infection due to Proteus 11/14/2022   Shoulder pain 11/12/2022   Pulmonary edema 11/11/2022   Elevated brain natriuretic peptide (BNP) level 11/11/2022   Iron deficiency anemia 11/11/2022   Asthma, chronic 11/11/2022   Hemorrhoids 06/14/2021   Left carotid stenosis 09/14/2018   Other persistent atrial fibrillation ([place]) 09/14/2018   Myositis, statin related 05/24/2018   Acquired hypothyroidism 05/22/2018   Acute loss of vision, left 05/22/2018   Vision loss of left eye 05/22/2018   Chronic diastolic heart failure (HCC) 06/21/2017   Generalized weakness 05/13/2017   Chest pain 05/09/2015   SSS (sick sinus syndrome) ([place]) 11/26/2014   Paroxysmal atrial flutter ([place]) 11/26/2014   Abnormal results of liver function studies 10/01/2014   Anxiety state 08/17/2014   Cough 08/17/2014   Fracture of humeral head, left, closed 07/22/2014   Multiple pelvic fractures ([place]) 07/22/2014   Fall 07/21/2014   GERD (gastroesophageal reflux disease) 06/09/2014   Unstable angina ([place]) 04/15/2014   C. 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 Cervical esophagoscopy 08/11/2012   S/P right total hip arthroplasty by Dr Noemi Chapel in 2009 08/11/2012   Odynophagia due to new traumatic peri-op esophageal perforation  08/09/2012   Aortic valve sclerosis, no stenosis 08/08/2012   Carotid bruit present bilat, dopplers OK 6/13 08/08/2012   Hx of dizziness 08/08/2012   Stage 3b chronic kidney disease (CKD) ([place]) 08/08/2012   Paroxysmal atrial fibrillation ([place]) 08/08/2012   Pacemaker March 2010 (MDT) 08/08/2012   DVT after previous hip surgery 08/08/2012   Status post hip hemiarthroplasty left hip by Dr Ronnie Derby 08/08/2012 08/08/2012   Closed fracture of left distal radius s/p ORIF by Dr Burney Gauze 08/08/2012 08/08/2012   Radial head fracture, closed, Lt 08/07/2012   Essential hypertension 08/07/2012   Coronary artery disease involving native coronary artery of native heart without angina pectoris 08/07/2012   DYSPNEA 07/28/2009   PCP:  Practice, [place]:   [place], [place] [address] [place] [address] Phone: [phone] Fax: [phone]     Social Determinants of Health (SDOH) Social History: SDOH Screenings   Food Insecurity: No Food Insecurity (11/13/2022)  Housing: Low Risk  (11/13/2022)  Transportation Needs: No Transportation Needs (11/13/2022)  Utilities: Not At Risk (11/13/2022)  Tobacco Use: Low Risk  (11/28/2022)   SDOH Interventions:     Readmission Risk Interventions    11/14/2022    1:02 PM  Readmission Risk Prevention Plan  Transportation Screening Complete  PCP or Specialist Appt within 5-7 Days Not Complete  Home Care Screening Complete  Medication Review (RN CM) Complete

## 2022-11-29 NOTE — Plan of Care (Signed)
  Problem: Acute Rehab PT Goals(only PT should resolve) Goal: Pt Will Go Supine/Side To Sit Outcome: Progressing Flowsheets (Taken 11/29/2022 1418) Pt will go Supine/Side to Sit:  with supervision  with min guard assist Goal: Patient Will Transfer Sit To/From Stand Outcome: Progressing Flowsheets (Taken 11/29/2022 1418) Patient will transfer sit to/from stand: with min guard assist Goal: Pt Will Transfer Bed To Chair/Chair To Bed Outcome: Progressing Flowsheets (Taken 11/29/2022 1418) Pt will Transfer Bed to Chair/Chair to Bed: min guard assist Goal: Pt Will Ambulate Outcome: Progressing Flowsheets (Taken 11/29/2022 1418) Pt will Ambulate:  25 feet  with minimal assist  with rolling walker   2:19 PM, 11/29/22 Lonell Grandchild, MPT Physical Therapist with [facility] [phone] office [phone] mobile phone

## 2022-11-29 NOTE — NC FL2 (Signed)
[place] LEVEL OF CARE FORM     IDENTIFICATION  Patient Name: Amy [name] Birthdate: 1921-11-03 Sex: female Admission Date (Current Location): 11/28/2022  [facility] and [place] Number:  [employer] and Address:  [place] [address], [place]      Provider Number: [id number]  Attending Physician Name and Address:  Roxan Hockey, MD  Relative Name and Phone Number:       Current Level of Care: Hospital Recommended Level of Care: [place] Prior Approval Number:    Date Approved/Denied:   PASRR Number: [id number]  Discharge Plan: SNF    Current Diagnoses: Patient Active Problem List   Diagnosis Date Noted   Pneumonia due to COVID-19 virus 11/28/2022   Aortic stenosis 11/28/2022   Acute respiratory failure and sepsis due to COVID-19 [facility]) 11/28/2022   Hypothyroidism 11/28/2022   Urinary tract infection due to Proteus 11/14/2022   Shoulder pain 11/12/2022   Pulmonary edema 11/11/2022   Elevated brain natriuretic peptide (BNP) level 11/11/2022   Iron deficiency anemia 11/11/2022   Asthma, chronic 11/11/2022   Hemorrhoids 06/14/2021   Left carotid stenosis 09/14/2018   Other persistent atrial fibrillation ([place]) 09/14/2018   Myositis, statin related 05/24/2018   Acquired hypothyroidism 05/22/2018   Acute loss of vision, left 05/22/2018   Vision loss of left eye 05/22/2018   Chronic diastolic heart failure (HCC) 06/21/2017   Generalized weakness 05/13/2017   Chest pain 05/09/2015   SSS (sick sinus syndrome) ([place]) 11/26/2014   Paroxysmal atrial flutter ([place]) 11/26/2014   Abnormal results of liver function studies 10/01/2014   Anxiety state 08/17/2014   Cough 08/17/2014   Fracture of humeral head, left, closed 07/22/2014   Multiple pelvic fractures ([place]) 07/22/2014   Fall 07/21/2014   GERD (gastroesophageal reflux disease) 06/09/2014   Unstable angina ([place]) 04/15/2014   C. difficile colitis  12/11/2012   Sigmoid diverticulitis 12/06/2012   UTI (lower urinary tract infection) 12/06/2012   S/P gastrostomy tube (G tube) placement, follow-up exam 10/17/2012   Pulmonary edema, noncardiac 08/14/2012   Acute blood loss anemia 08/12/2012   Mediastinitis due to esophageal perforation-s/p Incision and drainage, right neck and mediastinum, transcervical approach. Cervical esophagoscopy 08/11/2012   S/P right total hip arthroplasty by Dr Noemi Chapel in 2009 08/11/2012   Odynophagia due to new traumatic peri-op esophageal perforation 08/09/2012   Aortic valve sclerosis, no stenosis 08/08/2012   Carotid bruit present bilat, dopplers OK 6/13 08/08/2012   Hx of dizziness 08/08/2012   Stage 3b chronic kidney disease (CKD) ([place]) 08/08/2012   Paroxysmal atrial fibrillation ([place]) 08/08/2012   Pacemaker March 2010 (MDT) 08/08/2012   DVT after previous hip surgery 08/08/2012   Status post hip hemiarthroplasty left hip by Dr Ronnie Derby 08/08/2012 08/08/2012   Closed fracture of left distal radius s/p ORIF by Dr Burney Gauze 08/08/2012 08/08/2012   Radial head fracture, closed, Lt 08/07/2012   Essential hypertension 08/07/2012   Coronary artery disease involving native coronary artery of native heart without angina pectoris 08/07/2012   DYSPNEA 07/28/2009    Orientation RESPIRATION BLADDER Height & Weight     Self, Time, Situation, Place  O2 (2L) External catheter Weight: 160 lb (72.6 kg) Height:  '5\' 5"'$  (165.1 cm)  BEHAVIORAL SYMPTOMS/MOOD NEUROLOGICAL BOWEL NUTRITION STATUS      Continent Diet (Heart healthy/carb modified. See d/c summary for updates.)  AMBULATORY STATUS COMMUNICATION OF NEEDS Skin   Extensive Assist Verbally Bruising  Personal Care Assistance Level of Assistance  Bathing, Feeding, Dressing Bathing Assistance: Maximum assistance Feeding assistance: Limited assistance Dressing Assistance: Maximum assistance     Functional Limitations Info  Sight, Hearing,  Speech Sight Info: Impaired Hearing Info: Impaired Speech Info: Adequate    SPECIAL CARE FACTORS FREQUENCY  PT (By licensed PT)     PT Frequency: 5x weekly              Contractures      Additional Factors Info  Code Status, Allergies, Isolation Precautions Code Status Info: DNR Allergies Info: Colestipol, Hydrocodone, Niacin and Related, Statins, Zetia (ezetimibe)     Isolation Precautions Info: Airborne/contact   COVID + 11/28/22     Current Medications (11/29/2022):  This is the current hospital active medication list Current Facility-Administered Medications  Medication Dose Route Frequency Provider Last Rate Last Admin   acetaminophen (TYLENOL) tablet 650 mg  650 mg Oral Q6H PRN Orma Flaming, MD       Or   acetaminophen (TYLENOL) suppository 650 mg  650 mg Rectal Q6H PRN Orma Flaming, MD       albuterol (PROVENTIL) (2.5 MG/3ML) 0.083% nebulizer solution 2.5 mg  2.5 mg Nebulization TID Orma Flaming, MD   2.5 mg at 11/29/22 0735   albuterol (VENTOLIN HFA) 108 (90 Base) MCG/ACT inhaler 2 puff  2 puff Inhalation Q6H PRN Orma Flaming, MD       apixaban [place]ne [place]) tablet 2.5 mg  2.5 mg Oral BID Orma Flaming, MD   2.5 mg at 11/29/22 4128   ascorbic acid (VITAMIN C) tablet 500 mg  500 mg Oral Daily Orma Flaming, MD   500 mg at 11/29/22 0803   fluticasone furoate-vilanterol (BREO ELLIPTA) 200-25 MCG/ACT 1 puff  1 puff Inhalation Daily Orma Flaming, MD       And   umeclidinium bromide (INCRUSE ELLIPTA) 62.5 MCG/ACT 1 puff  1 puff Inhalation Daily Orma Flaming, MD       guaiFENesin-dextromethorphan (ROBITUSSIN DM) 100-10 MG/5ML syrup 10 mL  10 mL Oral Q4H PRN Orma Flaming, MD       levothyroxine (SYNTHROID) tablet 75 mcg  75 mcg Oral Q0600 Orma Flaming, MD   75 mcg at 11/29/22 0547   methylPREDNISolone sodium succinate (SOLU-MEDROL) 125 mg/2 mL injection 75 mg  75 mg Intravenous Q12H Orma Flaming, MD   75 mg at 11/29/22 7867   Followed by   Derrill Memo ON 12/02/2022]  predniSONE (DELTASONE) tablet 50 mg  50 mg Oral Daily Orma Flaming, MD       metoprolol succinate (TOPROL-XL) 24 hr tablet 25 mg  25 mg Oral Daily Orma Flaming, MD   25 mg at 11/29/22 0803   pantoprazole (PROTONIX) EC tablet 40 mg  40 mg Oral Daily Orma Flaming, MD   40 mg at 11/29/22 6720   senna-docusate (Senokot-S) tablet 1 tablet  1 tablet Oral QHS PRN Orma Flaming, MD       zinc sulfate capsule 220 mg  220 mg Oral Daily Orma Flaming, MD   220 mg at 11/29/22 0803     Discharge Medications: Please see discharge summary for a list of discharge medications.  Relevant Imaging Results:  Relevant Lab Results:   Additional Information SSN: [id number]  Salome Arnt, LCSW

## 2022-11-29 NOTE — Progress Notes (Signed)
   11/29/22 1520  Vitals  BP (!) 106/44  MAP (mmHg) (!) 63  BP Location Right Arm  BP Method Automatic  Patient Position (if appropriate) Lying  Pulse Rate 98  Pulse Rate Source Monitor  ECG Heart Rate 98  Resp 20  Level of Consciousness  Level of Consciousness Alert  MEWS COLOR  MEWS Score Color Green  Oxygen Therapy  SpO2 98 %  O2 Device Nasal Cannula  O2 Flow Rate (L/min) 2 L/min  Pain Assessment  Pain Scale 0-10  Pain Score 0  MEWS Score  MEWS Temp 0  MEWS Systolic 0  MEWS Pulse 0  MEWS RR 0  MEWS LOC 0  MEWS Score 0   Received call from central tele that pt had rhythm change and was currently showing v-tach. In to pt's room to find pt lying in bed, A&O, only complaint is restless legs. Apical HR regular with good peripheral pulses, skin warm, dry and pink in color, cap refill < 3 sec. Resp even and non-labored at rest, O2 on 2 lpm [place]. Review of telemetry shows paced rhythm. Central tele called back about another pt while I was in room evaluating [name] and states that pt has had rhythm change from NSR to paced with occasional PVC's but QRS is wide now and she was concerned for V-tach. Confirmed current paced rhythm with central tele.Received call from central tele that pt had rhythm change and was currently showing v-tach. In to pt's room to find pt lying in bed, A&O, only complaint is restless legs. Apical HR regular with good peripheral pulses, skin warm, dry and pink in color, cap refill < 3 sec. Resp even and non-labored at rest, O2 on 2 lpm [place]. Review of telemetry shows paced rhythm. Central tele called back about another pt while I was in room evaluating [name] and states that pt has had rhythm change from NSR to paced with occasional PVC's but QRS is wide now and she was concerned for V-tach. Confirmed current paced rhythm with central tele. MD Courage notified of change to paced rhythm.

## 2022-11-30 DIAGNOSIS — J9600 Acute respiratory failure, unspecified whether with hypoxia or hypercapnia: Secondary | ICD-10-CM | POA: Diagnosis not present

## 2022-11-30 DIAGNOSIS — U071 COVID-19: Secondary | ICD-10-CM | POA: Diagnosis not present

## 2022-11-30 LAB — GLUCOSE, CAPILLARY: Glucose-Capillary: 234 mg/dL — ABNORMAL HIGH (ref 70–99)

## 2022-11-30 LAB — CBC WITH DIFFERENTIAL/PLATELET
Abs Immature Granulocytes: 0.02 10*3/uL (ref 0.00–0.07)
Basophils Absolute: 0 10*3/uL (ref 0.0–0.1)
Basophils Relative: 0 %
Eosinophils Absolute: 0 10*3/uL (ref 0.0–0.5)
Eosinophils Relative: 0 %
HCT: 33.3 % — ABNORMAL LOW (ref 36.0–46.0)
Hemoglobin: 10.1 g/dL — ABNORMAL LOW (ref 12.0–15.0)
Immature Granulocytes: 1 %
Lymphocytes Relative: 14 %
Lymphs Abs: 0.6 10*3/uL — ABNORMAL LOW (ref 0.7–4.0)
MCH: 25.8 pg — ABNORMAL LOW (ref 26.0–34.0)
MCHC: 30.3 g/dL (ref 30.0–36.0)
MCV: 85.2 fL (ref 80.0–100.0)
Monocytes Absolute: 0.2 10*3/uL (ref 0.1–1.0)
Monocytes Relative: 4 %
Neutro Abs: 3.1 10*3/uL (ref 1.7–7.7)
Neutrophils Relative %: 81 %
Platelets: 200 10*3/uL (ref 150–400)
RBC: 3.91 MIL/uL (ref 3.87–5.11)
RDW: 17.3 % — ABNORMAL HIGH (ref 11.5–15.5)
WBC: 3.9 10*3/uL — ABNORMAL LOW (ref 4.0–10.5)
nRBC: 0 % (ref 0.0–0.2)

## 2022-11-30 LAB — COMPREHENSIVE METABOLIC PANEL
ALT: 15 U/L (ref 0–44)
AST: 32 U/L (ref 15–41)
Albumin: 2.8 g/dL — ABNORMAL LOW (ref 3.5–5.0)
Alkaline Phosphatase: 85 U/L (ref 38–126)
Anion gap: 7 (ref 5–15)
BUN: 27 mg/dL — ABNORMAL HIGH (ref 8–23)
CO2: 24 mmol/L (ref 22–32)
Calcium: 9 mg/dL (ref 8.9–10.3)
Chloride: 104 mmol/L (ref 98–111)
Creatinine, Ser: 1.11 mg/dL — ABNORMAL HIGH (ref 0.44–1.00)
GFR, Estimated: 45 mL/min — ABNORMAL LOW (ref >60)
Glucose, Bld: 201 mg/dL — ABNORMAL HIGH (ref 70–99)
Potassium: 3.9 mmol/L (ref 3.5–5.1)
Sodium: 135 mmol/L (ref 135–145)
Total Bilirubin: 0.4 mg/dL (ref 0.3–1.2)
Total Protein: 5.9 g/dL — ABNORMAL LOW (ref 6.5–8.1)

## 2022-11-30 NOTE — Hospital Course (Signed)
  Amy [name] is a  89 y.o. female with medical history significant of asthma, paroxysmal atrial fibrillation on Eliquis, GERD, CAD s/p CABG x 3, CKD stage IIIb,  hypertension, hypothyroidism , SSS s/p PPM, chronic diastolic CHF admitted on 0/31/5945 with acute hypoxic respiratory failure secondary to COVID-19 infection       -Assessment and Plan: 1) acute hypoxic respiratory failure secondary to COVID-19 infection-- Currently weaned down to 2 L of oxygen from 4 L of oxygen -Continue steroids, mucolytics and bronchodilators -Continue zinc and ascorbic acid COVID-19 Labs CRP       2) chronic diastolic dysfunction CHF exacerbation- -echo on 11/12/2022 with EF of 55 to 60% with grade 1 diastolic dysfunction and moderate pulmonary hypertension, with moderate to severe aortic stenosis -Continue metoprolol -Restart Lasix at 20 mg daily -Be careful not to significantly lower afterload in the setting of moderate to severe aortic stenosis   3)Moderate to severe aortic stenosis--probably contributory to #1 above -Be careful not to significantly lower afterload in the setting of moderate to severe aortic stenosis -Diuresis as above #1 -Patient has longstanding history of recurrent syncopal episodes   4)GERD--- PPI   5))Iron deficiency anemia -BP stable, monitor closely while on Eliquis Continue ferrous sulfate   6)CKD stage IIIb -Creatinine is actually slightly lower than recent baseline -Monitor closely while on Lasix Renally adjust medications, avoid nephrotoxic agents/dehydration/hypotension Lab Results  Component Value Date   CREATININE 1.11 (H) 11/30/2022   CREATININE 1.31 (H) 11/29/2022   CREATININE 1.50 (H) 11/28/2022      7)Paroxysmal atrial fibrillation/SSS -s/p PPM in 2010 -stable Continue Eliquis for stroke prophylaxis Continue Toprol-XL for rate control   8)Essential hypertension Continue Toprol-XL and Lasix   9)Acquired hypothyroidism Continue Synthroid    10)Generalized weakness and fall risk- -PT recommending  SNF Rehab, -Prior to admission patient was working to private pay for assisted living at [place] working on possible transfer to SNF rehab after [place] waiting period is over    Status is: Inpatient    Disposition: The patient is from: Home              Anticipated d/c is to: SNF              Anticipated d/c date is: 2 days

## 2022-11-30 NOTE — Progress Notes (Signed)
PROGRESS NOTE    Patient: Amy [name]                            PCP: [facility]                    DOB: 06-19-1922            DOA: 11/28/2022 BPZ:[id number]             DOS: 11/30/2022, 12:21 PM   LOS: 1 day   Date of Service: The patient was seen and examined on 11/30/2022  Subjective:   The patient was seen and examined this morning. Hemodynamically stable. Awake alert oriented this morning, continues to need 2 L of oxygen-satting 99%  Brief Narrative:    Amy [name] is a  89 y.o. female with medical history significant of asthma, paroxysmal atrial fibrillation on Eliquis, GERD, CAD s/p CABG x 3, CKD stage IIIb,  hypertension, hypothyroidism , SSS s/p PPM, chronic diastolic CHF admitted on 2/42/3536 with acute hypoxic respiratory failure secondary to COVID-19 infection       -Assessment and Plan: 1) acute hypoxic respiratory failure secondary to COVID-19 infection-- Currently weaned down to 2 L of oxygen from 4 L of oxygen -Continue steroids, mucolytics and bronchodilators -Continue zinc and ascorbic acid COVID-19 Labs CRP       2) chronic diastolic dysfunction CHF exacerbation- -echo on 11/12/2022 with EF of 55 to 60% with grade 1 diastolic dysfunction and moderate pulmonary hypertension, with moderate to severe aortic stenosis -Continue metoprolol -Restart Lasix at 20 mg daily -Be careful not to significantly lower afterload in the setting of moderate to severe aortic stenosis   3)Moderate to severe aortic stenosis--probably contributory to #1 above -Be careful not to significantly lower afterload in the setting of moderate to severe aortic stenosis -Diuresis as above #1 -Patient has longstanding history of recurrent syncopal episodes   4)GERD--- PPI   5))Iron deficiency anemia -BP stable, monitor closely while on Eliquis Continue ferrous sulfate   6)CKD stage IIIb -Creatinine is actually slightly lower than recent baseline -Monitor closely  while on Lasix Renally adjust medications, avoid nephrotoxic agents/dehydration/hypotension Lab Results  Component Value Date   CREATININE 1.11 (H) 11/30/2022   CREATININE 1.31 (H) 11/29/2022   CREATININE 1.50 (H) 11/28/2022      7)Paroxysmal atrial fibrillation/SSS -s/p PPM in 2010 -stable Continue Eliquis for stroke prophylaxis Continue Toprol-XL for rate control   8)Essential hypertension Continue Toprol-XL and Lasix   9)Acquired hypothyroidism Continue Synthroid   10)Generalized weakness and fall risk- -PT recommending  SNF Rehab, -Prior to admission patient was working to private pay for assisted living at [facility] working on possible transfer to SNF rehab after [facility] waiting period is over    Status is: Inpatient    Disposition: The patient is from: Home              Anticipated d/c is to: SNF              Anticipated d/c date is: 2 days   ----------------------------------------------------------------------------------------------------------------------------------------------- Nutritional status:  The patient's BMI is: Body mass index is 26.63 kg/m. I agree with the assessment and plan as outlined below: Nutrition Status:      ------------------------------------------------------------------------------------------------------------------------------------------------  DVT prophylaxis:  apixaban (ELIQUIS) tablet 2.5 mg Start: 11/28/22 2200 apixaban (ELIQUIS) tablet 2.5 mg   Code Status:   Code Status: DNR  Family Communication: Daughter present at bedside-updated  The above findings and plan of care has been discussed with patient (and family)  in detail,  they expressed understanding and agreement of above. -Advance care planning has been discussed.   Admission status:   Status is: Inpatient Remains inpatient appropriate because: Continuing supportive therapy including supplemental oxygen, COVID treatment   Disposition: From  - home              Planning for discharge in 1-2 days -patient requesting SNF if approved   (Inform daughter that might be prolonged wait per facility SNF policy)  Procedures:   No admission procedures for hospital encounter.   Antimicrobials:  Anti-infectives (From admission, onward)    Start     Dose/Rate Route Frequency Ordered Stop   11/30/22 1800  vancomycin (VANCOCIN) IVPB 1000 mg/200 mL premix  Status:  Discontinued        1,000 mg 200 mL/hr over 60 Minutes Intravenous Every 48 hours 11/28/22 1811 11/28/22 1920   11/28/22 1800  vancomycin (VANCOREADY) IVPB 1500 mg/300 mL        1,500 mg 150 mL/hr over 120 Minutes Intravenous  Once 11/28/22 1748 11/28/22 2012   11/28/22 1800  ceFEPIme (MAXIPIME) 2 g in sodium chloride 0.9 % 100 mL IVPB        2 g 200 mL/hr over 30 Minutes Intravenous  Once 11/28/22 1748 11/28/22 2057   11/28/22 1745  ceFEPIme (MAXIPIME) 1 g in sodium chloride 0.9 % 100 mL IVPB  Status:  Discontinued        1 g 200 mL/hr over 30 Minutes Intravenous  Once 11/28/22 1743 11/28/22 1748        Medication:   albuterol  2.5 mg Nebulization TID   apixaban  2.5 mg Oral BID   vitamin C  500 mg Oral Daily   ferrous sulfate  325 mg Oral Q breakfast   fluticasone furoate-vilanterol  1 puff Inhalation Daily   And   umeclidinium bromide  1 puff Inhalation Daily   furosemide  20 mg Oral Daily   levothyroxine  75 mcg Oral Q0600   methylPREDNISolone (SOLU-MEDROL) injection  75 mg Intravenous Q12H   Followed by   Derrill Memo ON 12/02/2022] predniSONE  50 mg Oral Daily   metoprolol succinate  25 mg Oral Daily   pantoprazole  40 mg Oral Daily   zinc sulfate  220 mg Oral Daily    acetaminophen **OR** acetaminophen, albuterol, guaiFENesin-dextromethorphan, senna-docusate   Objective:   Vitals:   11/29/22 1920 11/29/22 2229 11/30/22 0508 11/30/22 0730  BP:  (!) 133/50 (!) 162/65   Pulse: 78 83 64   Resp: '20 18 18   '$ Temp:  98 F (36.7 C) 98 F (36.7 C)   TempSrc:  Oral Oral    SpO2: 96% 98% 97% 99%  Weight:      Height:        Intake/Output Summary (Last 24 hours) at 11/30/2022 1221 Last data filed at 11/30/2022 0900 Gross per 24 hour  Intake 620 ml  Output 950 ml  Net -330 ml   Filed Weights   11/28/22 1813  Weight: 72.6 kg     Physical examination:   Constitution:  Alert, cooperative, no distress,  Appears calm and comfortable  Psychiatric:   Normal and stable mood and affect, cognition intact,   HEENT:        Normocephalic, PERRL, otherwise with in Normal limits  Chest:         Chest symmetric Cardio vascular:  S1/S2, RRR,  No murmure, No Rubs or Gallops  pulmonary: Clear to auscultation bilaterally, respirations unlabored, negative wheezes / crackles Abdomen: Soft, non-tender, non-distended, bowel sounds,no masses, no organomegaly Muscular skeletal: Limited exam - in bed, able to move all 4 extremities,   Neuro: CNII-XII intact. , normal motor and sensation, reflexes intact  Extremities: No pitting edema lower extremities, +2 pulses  Skin: Dry, warm to touch, negative for any Rashes, No open wounds Wounds: per nursing documentation   ------------------------------------------------------------------------------------------------------------------------------------------    LABs:     Latest Ref Rng & Units 11/30/2022    4:50 AM 11/29/2022    4:37 AM 11/28/2022    2:36 PM  CBC  WBC 4.0 - 10.5 K/uL 3.9  4.2  5.9   Hemoglobin 12.0 - 15.0 g/dL 10.1  10.9  10.2   Hematocrit 36.0 - 46.0 % 33.3  36.2  33.9   Platelets 150 - 400 K/uL 200  195  214       Latest Ref Rng & Units 11/30/2022    4:50 AM 11/29/2022    4:37 AM 11/28/2022    2:36 PM  CMP  Glucose 70 - 99 mg/dL 201  149  94   BUN 8 - 23 mg/dL '27  24  25   '$ Creatinine 0.44 - 1.00 mg/dL 1.11  1.31  1.50   Sodium 135 - 145 mmol/L 135  137  135   Potassium 3.5 - 5.1 mmol/L 3.9  3.9  4.0   Chloride 98 - 111 mmol/L 104  103  98   CO2 22 - 32 mmol/L '24  23  29   '$ Calcium 8.9 - 10.3 mg/dL 9.0   8.6  9.0   Total Protein 6.5 - 8.1 g/dL 5.9  6.1  6.2   Total Bilirubin 0.3 - 1.2 mg/dL 0.4  0.5  0.6   Alkaline Phos 38 - 126 U/L 85  89  81   AST 15 - 41 U/L 32  31  27   ALT 0 - 44 U/L '15  14  13        '$ Micro Results Recent Results (from the past 240 hour(s))  Resp panel by RT-PCR (RSV, Flu A&B, Covid) Anterior Nasal Swab     Status: Abnormal   Collection Time: 11/28/22  2:12 PM   Specimen: Anterior Nasal Swab  Result Value Ref Range Status   SARS Coronavirus 2 by RT PCR POSITIVE (A) NEGATIVE Final    Comment: (NOTE) SARS-CoV-2 target nucleic acids are DETECTED.  The SARS-CoV-2 RNA is generally detectable in upper respiratory specimens during the acute phase of infection. Positive results are indicative of the presence of the identified virus, but do not rule out bacterial infection or co-infection with other pathogens not detected by the test. Clinical correlation with patient history and other diagnostic information is necessary to determine patient infection status. The expected result is Negative.  Fact Sheet for Patients: EntrepreneurPulse.com.au  Fact Sheet for Healthcare Providers: IncredibleEmployment.be  This test is not yet approved or cleared by the [place] FDA and  has been authorized for detection and/or diagnosis of SARS-CoV-2 by FDA under an Emergency Use Authorization (EUA).  This EUA will remain in effect (meaning this test can be used) for the duration of  the COVID-19 declaration under Section 564(b)(1) of the A ct, 21 U.S.C. section 360bbb-3(b)(1), unless the authorization is terminated or revoked sooner.     Influenza A by PCR NEGATIVE NEGATIVE Final   Influenza B by PCR NEGATIVE NEGATIVE Final  Comment: (NOTE) The Xpert Xpress SARS-CoV-2/FLU/RSV plus assay is intended as an aid in the diagnosis of influenza from Nasopharyngeal swab specimens and should not be used as a sole basis for treatment. Nasal  washings and aspirates are unacceptable for Xpert Xpress SARS-CoV-2/FLU/RSV testing.  Fact Sheet for Patients: EntrepreneurPulse.com.au  Fact Sheet for Healthcare Providers: IncredibleEmployment.be  This test is not yet approved or cleared by the [place] FDA and has been authorized for detection and/or diagnosis of SARS-CoV-2 by FDA under an Emergency Use Authorization (EUA). This EUA will remain in effect (meaning this test can be used) for the duration of the COVID-19 declaration under Section 564(b)(1) of the Act, 21 U.S.C. section 360bbb-3(b)(1), unless the authorization is terminated or revoked.     Resp Syncytial Virus by PCR NEGATIVE NEGATIVE Final    Comment: (NOTE) Fact Sheet for Patients: EntrepreneurPulse.com.au  Fact Sheet for Healthcare Providers: IncredibleEmployment.be  This test is not yet approved or cleared by the [place] FDA and has been authorized for detection and/or diagnosis of SARS-CoV-2 by FDA under an Emergency Use Authorization (EUA). This EUA will remain in effect (meaning this test can be used) for the duration of the COVID-19 declaration under Section 564(b)(1) of the Act, 21 U.S.C. section 360bbb-3(b)(1), unless the authorization is terminated or revoked.  Performed at [facility], [address] [place], [place] [address]   Blood culture (routine x 2)     Status: None (Preliminary result)   Collection Time: 11/28/22  2:36 PM   Specimen: BLOOD  Result Value Ref Range Status   Specimen Description BLOOD BLOOD LEFT HAND AEROBIC BOTTLE ONLY  Final   Special Requests NONE  Final   Culture   Final    NO GROWTH 1 DAY Performed at [facility], [address] [place], [place] [address]    Report Status PENDING  Incomplete  Blood culture (routine x 2)     Status: None (Preliminary result)   Collection Time: 11/28/22  2:36 PM   Specimen: BLOOD  Result Value Ref  Range Status   Specimen Description BLOOD BLOOD RIGHT ARM AEROBIC BOTTLE ONLY  Final   Special Requests NONE  Final   Culture   Final    NO GROWTH 1 DAY Performed at [facility], [address] [place], [place] [address]    Report Status PENDING  Incomplete    Radiology Reports No results found.  SIGNED: Deatra James, MD, FHM. FAAFP. Zacarias Pontes - [facility] Time spent > 35 min.  In seeing, evaluating and examining the patient. Reviewing medical records, labs, drawn plan of care. [facility],  Pager (please use [email or web address] to page/ text) Please use Epic Secure Chat for non-urgent communication (7AM-7PM)  If 7PM-7AM, please contact night-coverage [email or web address], 11/30/2022, 12:21 PM

## 2022-11-30 NOTE — Plan of Care (Signed)
  Problem: Acute Rehab OT Goals (only OT should resolve) Goal: Pt. Will Perform Grooming Flowsheets (Taken 11/30/2022 1009) Pt Will Perform Grooming:  with supervision  with min assist  sitting Goal: Pt. Will Perform Upper Body Dressing Flowsheets (Taken 11/30/2022 1009) Pt Will Perform Upper Body Dressing:  with min assist  with min guard assist  sitting Goal: Pt. Will Transfer To Toilet Flowsheets (Taken 11/30/2022 1009) Pt Will Transfer to Toilet:  with modified independence  stand pivot transfer Goal: Pt/Caregiver Will Perform Home Exercise Program Flowsheets (Taken 11/30/2022 1009) Pt/caregiver will Perform Home Exercise Program:  Increased ROM  Increased strength  Right Upper extremity  Left upper extremity  With minimal assist  Yohann Curl OT, MOT

## 2022-11-30 NOTE — Progress Notes (Signed)
Remote pacemaker transmission.   

## 2022-11-30 NOTE — TOC Progression Note (Signed)
Transition of Care [facility]) - Progression Note    Patient Details  Name: Niger M Pinson MRN: [id number] Date of Birth: 1922/02/27  [facility] CM/SW Contact  Ihor Gully, LCSW Phone Number: 11/30/2022, 4:01 PM  Clinical Narrative:    Daughter, Juliann Pulse, states that she has made arrangements for patient to have 24/7 care and patient will d/c home.    Expected Discharge Plan: [place] Barriers to Discharge: Continued Medical Work up  Expected Discharge Plan and Services In-house Referral: Clinical Social Work   Post Acute Care Choice: [place] Living arrangements for the past 2 months: [place] Determinants of Health (SDOH) Interventions SDOH Screenings   Food Insecurity: No Food Insecurity (11/29/2022)  Housing: Low Risk  (11/29/2022)  Transportation Needs: No Transportation Needs (11/29/2022)  Utilities: Not At Risk (11/29/2022)  Tobacco Use: Low Risk  (11/28/2022)    Readmission Risk Interventions    11/14/2022    1:02 PM  Readmission Risk Prevention Plan  Transportation Screening Complete  PCP or Specialist Appt within 5-7 Days Not Complete  Home Care Screening Complete  Medication Review (RN CM) Complete

## 2022-11-30 NOTE — Care Management Important Message (Signed)
Important Message  Patient Details  Name: Amy [name] MRN: [id number] Date of Birth: Jul 10, 1922   Medicare Important Message Given:  Yes     Tommy Medal 11/30/2022, 4:21 PM

## 2022-11-30 NOTE — Evaluation (Signed)
Occupational Therapy Evaluation Patient Details Name: Amy [name] MRN: [id number] DOB: [birth year] Today's Date: 11/30/2022   History of Present Illness Amy [name] is a 89 y.o. female with medical history significant of  asthma, paroxysmal atrial fibrillation on Eliquis, GERD, CAD s/p CABG x 3, hypertension, hypothyroidism, CKD stage 3b, SSS s/p PPM, chronic diastolic CHF who presented with complaints of shortness of breath. Yesterday she started to feel bad and had a temperature. She was nauseated last night.  This morning she didn't eat breakfast, but it tasted horrible so she went back to bed. Her nurse came and got her up and she was too weak to stand up and got back to the bed.  Her nurse called EMS due to the weakness and low oxygen to 84% on RA. She does have a cough that is mainly dry. If she does have a productive cough it is clear mucous. She has no congestion or sinus pain/pressure. No headaches or new body aches. No swelling in her legs. She was lightheaded this morning. Denies any sick contacts. She does have some dyspnea on exertion, but none at rest. She has no chest pain.   Clinical Impression   Pt agreeable to OT evaluation. Pt requies much assist for ADL's at baseline but typically can transfer using RW. Today pt required min G to min A for transfer to chair with noted SpO2 desaturation to 75% despite being on 2 L supplemental O2. Pt's daughter present and reporting that she has back pain and may not be able to provide the level of support needed at this time. Pt has care providers but they will not be available while the pt has covid. Pt was left in chair and was able to return to SpO2 levels above 90%. Pt will benefit from continued OT in the hospital and recommended venue below to increase strength, balance, and endurance for safe ADL's.         Recommendations for follow up therapy are one component of a multi-disciplinary discharge planning process, led by the attending  physician.  Recommendations may be updated based on patient status, additional functional criteria and insurance authorization.   Follow Up Recommendations  Skilled nursing-short term rehab (<3 hours/day)     Assistance Recommended at Discharge Intermittent Supervision/Assistance  Patient can return home with the following A little help with walking and/or transfers;A lot of help with bathing/dressing/bathroom;Assistance with cooking/housework;Assist for transportation;Help with stairs or ramp for entrance    Functional Status Assessment  Patient has had a recent decline in their functional status and demonstrates the ability to make significant improvements in function in a reasonable and predictable amount of time.  Equipment Recommendations  None recommended by OT    Recommendations for Other Services       Precautions / Restrictions Precautions Precautions: Fall Restrictions Weight Bearing Restrictions: No      Mobility Bed Mobility Overal bed mobility: Needs Assistance Bed Mobility: Supine to Sit     Supine to sit: Min guard, HOB elevated     General bed mobility comments: slow labored movement    Transfers Overall transfer level: Needs assistance Equipment used: Rolling walker (2 wheels) Transfers: Sit to/from Stand, Bed to chair/wheelchair/BSC Sit to Stand: Min guard, Min assist     Step pivot transfers: Min guard, Min assist     General transfer comment: increased time and mild labored movement.      Balance Overall balance assessment: Needs assistance Sitting-balance support: Feet supported, No upper extremity supported  Sitting balance-Leahy Scale: Fair Sitting balance - Comments: fair/good seated at EOB   Standing balance support: During functional activity, Bilateral upper extremity supported Standing balance-Leahy Scale: Poor Standing balance comment: poor to fair with RW                           ADL either performed or assessed  with clinical judgement   ADL Overall ADL's : Needs assistance/impaired     Grooming: Minimal assistance;Moderate assistance;Sitting   Upper Body Bathing: Moderate assistance;Sitting   Lower Body Bathing: Maximal assistance;Sitting/lateral leans   Upper Body Dressing : Moderate assistance;Minimal assistance;Sitting   Lower Body Dressing: Maximal assistance;Sitting/lateral leans   Toilet Transfer: Minimal assistance;Min guard;Rolling walker (2 wheels);Stand-pivot   Toileting- Clothing Manipulation and Hygiene: Maximal assistance;Bed level               Vision Baseline Vision/History: 1 Wears glasses Ability to See in Adequate Light: 1 Impaired Patient Visual Report: Blurring of vision Additional Comments: Pt reports increase in blurry vision over the past few days. Pt reports inability to read the clock. No other deficits observed during functional tasks.     Perception Perception Perception Tested?: No   Praxis Praxis Praxis tested?: Not tested    Pertinent Vitals/Pain Pain Assessment Pain Assessment: Faces Faces Pain Scale: Hurts a little bit Pain Location: B shoulders; chronic pain. Pain Descriptors / Indicators: Guarding Pain Intervention(s): Limited activity within patient's tolerance, Monitored during session, Repositioned     Hand Dominance Right   Extremity/Trunk Assessment Upper Extremity Assessment Upper Extremity Assessment: RUE deficits/detail;LUE deficits/detail RUE Deficits / Details: 3-/5 shoulder flexion; generally weak otherwise. History of torn rotator cuff. LUE Deficits / Details: 2+/5 shoulder flexion at baseline; generally weak otherwise.   Lower Extremity Assessment Lower Extremity Assessment: Defer to PT evaluation   Cervical / Trunk Assessment Cervical / Trunk Assessment: Normal   Communication Communication Communication: No difficulties   Cognition Arousal/Alertness: Awake/alert Behavior During Therapy: WFL for tasks  assessed/performed Overall Cognitive Status: Within Functional Limits for tasks assessed                                                        Home Living Family/patient expects to be discharged to:: Private residence Living Arrangements: Children Available Help at Discharge: Family;Personal care attendant;Available 24 hours/day Type of Home: House Home Access: Ramped entrance     Home Layout: One level;Able to live on main level with bedroom/bathroom;Laundry or work area in basement;Full bath on main level     Bathroom Shower/Tub: Occupational psychologist: Handicapped height Bathroom Accessibility: Yes   Home Equipment: Conservation officer, nature (2 wheels);Rollator (4 wheels);BSC/3in1;Cane - single point;Shower seat;Grab bars - tub/shower;Wheelchair - manual          Prior Functioning/Environment Prior Level of Function : Needs assist       Physical Assist : Mobility (physical);ADLs (physical) Mobility (physical): Bed mobility;Transfers;Gait;Stairs ADLs (physical): Bathing;Dressing;Toileting;Grooming Mobility Comments: Supervised short household distances using RW ADLs Comments: Much assist from family and aides. Pt able to feed self but requires assist for all other ADL's.        OT Problem List: Decreased strength;Decreased range of motion;Decreased activity tolerance;Impaired balance (sitting and/or standing)      OT Treatment/Interventions: Self-care/ADL training;Therapeutic exercise;DME and/or AE instruction;Patient/family education;Balance  training;Visual/perceptual remediation/compensation;Therapeutic activities    OT Goals(Current goals can be found in the care plan section) Acute Rehab OT Goals Patient Stated Goal: Go to rehab. OT Goal Formulation: With patient Time For Goal Achievement: 12/14/22 Potential to Achieve Goals: Good ADL Goals Pt Will Perform Grooming: with supervision;with min assist;sitting Pt Will Perform Upper Body  Dressing: with min assist;with min guard assist;sitting Pt Will Transfer to Toilet: with modified independence;stand pivot transfer Pt/caregiver will Perform Home Exercise Program: Increased ROM;Increased strength;Right Upper extremity;Left upper extremity;With minimal assist  OT Frequency: Min 2X/week                                   End of Session Equipment Utilized During Treatment: Rolling walker (2 wheels);Oxygen  Activity Tolerance: Patient tolerated treatment well Patient left: in chair;with call bell/phone within reach;with family/visitor present  OT Visit Diagnosis: Unsteadiness on feet (R26.81);Muscle weakness (generalized) (M62.81);Other abnormalities of gait and mobility (R26.89)                Time: 7121-9758 OT Time Calculation (min): 14 min Charges:  OT General Charges $OT Visit: 1 Visit OT Evaluation $OT Eval Low Complexity: 1 Low  Janaysia Mcleroy OT, MOT  Larey Seat 11/30/2022, 10:11 AM

## 2022-11-30 NOTE — Progress Notes (Signed)
Patient had 3 episodes of shaking one after PT and we had her stand with walker to clean up and 2 in bed, also c/o BLE leg pain take Gabapentin 100 mg BID sent MD secure chat. Daughter states she did this yesterday.

## 2022-11-30 NOTE — TOC Progression Note (Signed)
Transition of Care [facility]) - Progression Note    Patient Details  Name: Niger M Corrales MRN: [id number] Date of Birth: [birth year]  Transition of Care [facility]) CM/SW Contact  Ihor Gully, LCSW Phone Number: 11/30/2022, 11:54 AM  Clinical Narrative:    Kathleen Argue [facility] accepts patient. They can take her 10 days form her positive COVID test. Daughter notified. Daughter to call back with decision on accepting bed offer.    Expected Discharge Plan: [place] Barriers to Discharge: Continued Medical Work up  Expected Discharge Plan and Services In-house Referral: Clinical Social Work   Post Acute Care Choice: [place] Living arrangements for the past 2 months: [place] Determinants of Health (SDOH) Interventions SDOH Screenings   Food Insecurity: No Food Insecurity (11/29/2022)  Housing: Low Risk  (11/29/2022)  Transportation Needs: No Transportation Needs (11/29/2022)  Utilities: Not At Risk (11/29/2022)  Tobacco Use: Low Risk  (11/28/2022)    Readmission Risk Interventions    11/14/2022    1:02 PM  Readmission Risk Prevention Plan  Transportation Screening Complete  PCP or Specialist Appt within 5-7 Days Not Complete  Home Care Screening Complete  Medication Review (RN CM) Complete

## 2022-12-01 DIAGNOSIS — U071 COVID-19: Secondary | ICD-10-CM | POA: Diagnosis not present

## 2022-12-01 DIAGNOSIS — J9600 Acute respiratory failure, unspecified whether with hypoxia or hypercapnia: Secondary | ICD-10-CM | POA: Diagnosis not present

## 2022-12-01 LAB — CBC WITH DIFFERENTIAL/PLATELET
Abs Immature Granulocytes: 0.05 10*3/uL (ref 0.00–0.07)
Basophils Absolute: 0 10*3/uL (ref 0.0–0.1)
Basophils Relative: 0 %
Eosinophils Absolute: 0 10*3/uL (ref 0.0–0.5)
Eosinophils Relative: 0 %
HCT: 34 % — ABNORMAL LOW (ref 36.0–46.0)
Hemoglobin: 10.3 g/dL — ABNORMAL LOW (ref 12.0–15.0)
Immature Granulocytes: 1 %
Lymphocytes Relative: 10 %
Lymphs Abs: 0.7 10*3/uL (ref 0.7–4.0)
MCH: 25.8 pg — ABNORMAL LOW (ref 26.0–34.0)
MCHC: 30.3 g/dL (ref 30.0–36.0)
MCV: 85.2 fL (ref 80.0–100.0)
Monocytes Absolute: 0.2 10*3/uL (ref 0.1–1.0)
Monocytes Relative: 3 %
Neutro Abs: 5.6 10*3/uL (ref 1.7–7.7)
Neutrophils Relative %: 86 %
Platelets: 214 10*3/uL (ref 150–400)
RBC: 3.99 MIL/uL (ref 3.87–5.11)
RDW: 17.6 % — ABNORMAL HIGH (ref 11.5–15.5)
WBC: 6.5 10*3/uL (ref 4.0–10.5)
nRBC: 0 % (ref 0.0–0.2)

## 2022-12-01 LAB — COMPREHENSIVE METABOLIC PANEL
ALT: 13 U/L (ref 0–44)
AST: 30 U/L (ref 15–41)
Albumin: 3 g/dL — ABNORMAL LOW (ref 3.5–5.0)
Alkaline Phosphatase: 78 U/L (ref 38–126)
Anion gap: 10 (ref 5–15)
BUN: 30 mg/dL — ABNORMAL HIGH (ref 8–23)
CO2: 25 mmol/L (ref 22–32)
Calcium: 8.8 mg/dL — ABNORMAL LOW (ref 8.9–10.3)
Chloride: 100 mmol/L (ref 98–111)
Creatinine, Ser: 1.08 mg/dL — ABNORMAL HIGH (ref 0.44–1.00)
GFR, Estimated: 46 mL/min — ABNORMAL LOW (ref >60)
Glucose, Bld: 164 mg/dL — ABNORMAL HIGH (ref 70–99)
Potassium: 3.6 mmol/L (ref 3.5–5.1)
Sodium: 135 mmol/L (ref 135–145)
Total Bilirubin: 0.3 mg/dL (ref 0.3–1.2)
Total Protein: 6.3 g/dL — ABNORMAL LOW (ref 6.5–8.1)

## 2022-12-01 LAB — C-REACTIVE PROTEIN: CRP: 0.9 mg/dL (ref <1.0)

## 2022-12-01 MED ORDER — ZINC SULFATE 220 (50 ZN) MG PO CAPS: 220.0000 mg | ORAL_CAPSULE | Freq: Every day | ORAL | 0 refills | Status: AC

## 2022-12-01 MED ORDER — GABAPENTIN 600 MG PO TABS
300.0000 mg | ORAL_TABLET | Freq: Every day | ORAL | Status: DC
  Filled 2022-12-01: qty 0.5

## 2022-12-01 MED ORDER — METHYLPREDNISOLONE 4 MG PO TBPK: ORAL_TABLET | ORAL | 0 refills | Status: DC

## 2022-12-01 MED ORDER — ASCORBIC ACID 500 MG PO TABS: 500.0000 mg | ORAL_TABLET | Freq: Every day | ORAL | 0 refills | Status: AC

## 2022-12-01 MED ORDER — GABAPENTIN 300 MG PO CAPS: 300.0000 mg | ORAL_CAPSULE | Freq: Every day | ORAL | Status: DC

## 2022-12-01 NOTE — TOC Transition Note (Signed)
Transition of Care [facility]) - CM/SW Discharge Note   Patient Details  Name: Amy [name] MRN: [id number] Date of Birth: [birth year]  Transition of Care [facility]) CM/SW Contact:  Ihor Gully, LCSW Phone Number: 12/01/2022, 1:15 PM   Clinical Narrative:    Patient is active with [facility]. Will remain with Theodosia Quay with [facility] notified. [facility] notified of d/c and orders.   Final next level of care: [place] Barriers to Discharge: No Barriers Identified   Patient Goals and CMS Choice   Choice offered to / list presented to : Adult Children  Discharge Placement                         Discharge Plan and Services Additional resources added to the After Visit Summary for   In-house Referral: Clinical Social Work   Post Acute Care Choice: Skilled Nursing Facility                    HH Arranged: PT, Nurse's Aide, RN [facility] Agency: [place] ([facility]) Date HH Agency Contacted: 12/01/22 Time [place]: [phone] Representative spoke with at [place]: [place] ([place]) Interventions SDOH Screenings   Food Insecurity: No Food Insecurity (11/29/2022)  Housing: Low Risk  (11/29/2022)  Transportation Needs: No Transportation Needs (11/29/2022)  Utilities: Not At Risk (11/29/2022)  Tobacco Use: Low Risk  (11/28/2022)     Readmission Risk Interventions    11/14/2022    1:02 PM  Readmission Risk Prevention Plan  Transportation Screening Complete  PCP or Specialist Appt within 5-7 Days Not Complete  Home Care Screening Complete  Medication Review (RN CM) Complete

## 2022-12-01 NOTE — Discharge Summary (Signed)
Physician Discharge Summary   Patient: Amy [name] MRN: [id number] DOB: Dec 05, 1921  Admit date:     11/28/2022  Discharge date: 12/01/22  Discharge Physician: Deatra James   PCP: [facility]   Recommendations at discharge:   Follow with the PCP in 2-4 weeks Follow-up with home health Fall precautions   Discharge Diagnoses: Principal Problem:   Acute respiratory failure and sepsis due to COVID-19 [facility]) Active Problems:   Essential hypertension   Coronary artery disease involving native coronary artery of native heart without angina pectoris   Chronic diastolic heart failure (HCC)   Paroxysmal atrial fibrillation (HCC)   Stage 3b chronic kidney disease (CKD) (HCC)   Aortic stenosis   Asthma, chronic   SSS (sick sinus syndrome) (HCC)   GERD (gastroesophageal reflux disease)   Pneumonia due to COVID-19 virus   Hypothyroidism   Acute respiratory failure with hypoxia ([place])  Resolved Problems:   Hypercholesterolemia with LDL greater than 190 mg/dL  Hospital Course:  Amy [name] is a  89 y.o. female with medical history significant of asthma, paroxysmal atrial fibrillation on Eliquis, GERD, CAD s/p CABG x 3, CKD stage IIIb,  hypertension, hypothyroidism , SSS s/p PPM, chronic diastolic CHF admitted on 4/40/3474 with acute hypoxic respiratory failure secondary to COVID-19 infection       -Assessment and Plan: 1) acute hypoxic respiratory failure secondary to COVID-19 infection-- -much improved down from 4, 2, now room air satting 100% -Continue steroids, mucolytics and bronchodilators Steroid switch to p.o. taper -Continue zinc and ascorbic acid COVID-19 Labs CRP improved      2) chronic diastolic dysfunction CHF exacerbation- -echo on 11/12/2022 with EF of 55 to 60% with grade 1 diastolic dysfunction and moderate pulmonary hypertension, with moderate to severe aortic stenosis -Continue metoprolol -Restart Lasix at 20 mg daily -Be careful not to  significantly lower afterload in the setting of moderate to severe aortic stenosis   3)Moderate to severe aortic stenosis--probably contributory to #1 above -Be careful not to significantly lower afterload in the setting of moderate to severe aortic stenosis -Diuresis as above #1 -Patient has longstanding history of recurrent syncopal episodes   4)GERD--- PPI   5))Iron deficiency anemia -BP stable, monitor closely while on Eliquis Continue ferrous sulfate   6)CKD stage IIIb -Creatinine is actually slightly lower than recent baseline -Monitor closely while on Lasix Renally adjust medications, avoid nephrotoxic agents/dehydration/hypotension Lab Results  Component Value Date   CREATININE 1.11 (H) 11/30/2022   CREATININE 1.31 (H) 11/29/2022   CREATININE 1.50 (H) 11/28/2022      7)Paroxysmal atrial fibrillation/SSS -s/p PPM in 2010 -stable Continue Eliquis for stroke prophylaxis Continue Toprol-XL for rate control   8)Essential hypertension Continue Toprol-XL and Lasix   9)Acquired hypothyroidism Continue Synthroid   10)Generalized weakness and fall risk- -PT recommending  SNF Rehab, -Prior to admission patient was working to private pay for assisted living at [place] working on possible transfer to SNF rehab after [place] waiting period is over    Status is: Inpatient    Disposition: The patient is from: Home               Anticipated d/c date is: Home with home health    Disposition: Home health Diet recommendation:  Discharge Diet Orders (From admission, onward)     Start     Ordered   12/01/22 0000  Diet - low sodium heart healthy        12/01/22 1127  Cardiac diet DISCHARGE MEDICATION: Allergies as of 12/01/2022       Reactions   Colestipol Other (See Comments)   Muscle aches   Hydrocodone Itching   Niacin And Related Other (See Comments)   Muscle pain   Statins Other (See Comments)   Muscle aches on all she has tried.  She recalls  Lipitor, Crestor, and Livalo but thinks there were others   Zetia [ezetimibe] Other (See Comments)   Muscle aches        Medication List     STOP taking these medications    bisacodyl 5 MG EC tablet Commonly known as: DULCOLAX       TAKE these medications    acetaminophen 500 MG tablet Commonly known as: TYLENOL Take 500 mg by mouth every 6 (six) hours as needed for moderate pain.   albuterol 108 (90 Base) MCG/ACT inhaler Commonly known as: VENTOLIN HFA Inhale 2 puffs into the lungs every 6 (six) hours as needed for wheezing or shortness of breath.   ascorbic acid 500 MG tablet Commonly known as: VITAMIN C Take 1 tablet (500 mg total) by mouth daily for 3 days. Start taking on: December 02, 2022   Eliquis 2.5 MG Tabs tablet Generic drug: apixaban Take 1 tablet by mouth twice daily What changed: how much to take   ferrous sulfate 325 (65 FE) MG tablet Take 1 tablet (325 mg total) by mouth every other day.   furosemide 20 MG tablet Commonly known as: LASIX Take 1 tablet (20 mg total) by mouth every other day.   gabapentin 100 MG capsule Commonly known as: NEURONTIN Take 1 capsule (100 mg total) by mouth 2 (two) times daily as needed (neuropathy pain). What changed: when to take this   levothyroxine 75 MCG tablet Commonly known as: SYNTHROID Take 1 tablet by mouth daily.   methylPREDNISolone 4 MG Tbpk tablet Commonly known as: MEDROL DOSEPAK Medrol Dosepak take as instructed   metoprolol succinate 25 MG 24 hr tablet Commonly known as: TOPROL-XL Take 1 tablet by mouth once daily   nitroGLYCERIN 0.4 MG SL tablet Commonly known as: NITROSTAT Place 1 tablet (0.4 mg total) under the tongue every 5 (five) minutes x 3 doses as needed for chest pain.   omeprazole 20 MG capsule Commonly known as: PRILOSEC Take 20 mg by mouth daily.   ondansetron 4 MG disintegrating tablet Commonly known as: ZOFRAN-ODT Take 4 mg by mouth every 12 (twelve) hours as needed for  nausea or vomiting.   STOOL SOFTENER PLUS PO Take 1 tablet by mouth daily as needed (loose stool).   Trelegy Ellipta 200-62.5-25 MCG/ACT Aepb Generic drug: Fluticasone-Umeclidin-Vilant Inhale 1 puff into the lungs daily.   VITAMIN D3 PO Take 1 tablet by mouth daily.   zinc sulfate 220 (50 Zn) MG capsule Take 1 capsule (220 mg total) by mouth daily for 3 days. Start taking on: December 02, 2022        Discharge Exam: Danley Danker Weights   11/28/22 1813  Weight: 72.6 kg   General:  AAO x 3,  cooperative, no distress;   HEENT:  Normocephalic, PERRL, otherwise with in Normal limits   Neuro:  CNII-XII intact. , normal motor and sensation, reflexes intact   Lungs:   Clear to auscultation BL, Respirations unlabored,  No wheezes / crackles  Cardio:    S1/S2, RRR, No murmure, No Rubs or Gallops   Abdomen:  Soft, non-tender, bowel sounds active all four quadrants, no guarding or peritoneal signs.  Muscular  skeletal:  Limited exam -global generalized weaknesses - in bed, able to move all 4 extremities,   2+ pulses,  symmetric, No pitting edema  Skin:  Dry, warm to touch, negative for any Rashes,  Wounds: Please see nursing documentation          Condition at discharge: good  The results of significant diagnostics from this hospitalization (including imaging, microbiology, ancillary and laboratory) are listed below for reference.   Imaging Studies: DG Chest Port 1 View  Result Date: 11/28/2022 CLINICAL DATA:  Shortness of breath. EXAM: PORTABLE CHEST 1 VIEW COMPARISON:  November 11, 2022 FINDINGS: Mediastinal contour and cardiac silhouette are stable. Heart size is enlarged. Cardiac pacemaker is unchanged. Status post prior median sternotomy and CABG. Patchy consolidation of left lung base is identified with small left pleural effusion. The right lung is clear. Osseous structures are stable. IMPRESSION: Patchy consolidation of left lung base with small left pleural effusion.  Pneumonia of left lung base is not excluded. Electronically Signed   By: Abelardo Diesel M.D.   On: 11/28/2022 15:06   ECHOCARDIOGRAM COMPLETE  Result Date: 11/12/2022    ECHOCARDIOGRAM REPORT   Patient Name:   Amy [name] Date of Exam: 11/12/2022 Medical Rec #:  [id number]     Height:       65.5 in Accession #:    [id number]    Weight:       157.6 lb Date of Birth:  October 19, 1922      BSA:          1.798 m Patient Age:    78 years      BP:           168/67 mmHg Patient Gender: F             HR:           66 bpm. Exam Location:  [facility] Procedure: 2D Echo, Color Doppler and Cardiac Doppler Indications:    V03.50 Acute systolic (congestive) heart failure  History:        Patient has prior history of Echocardiogram examinations, most                 recent 12/17/2021. CAD, Pacemaker and Prior Cardiac Surgery,                 Arrythmias:Atrial Fibrillation; Risk Factors:Hypertension and                 Dyslipidemia.  Sonographer:    Raquel Sarna Senior RDCS Referring Phys: [id number] OLADAPO ADEFESO IMPRESSIONS  1. Left ventricular ejection fraction, by estimation, is 55 to 60%. The left ventricle has normal function. The left ventricle has no regional wall motion abnormalities. Left ventricular diastolic parameters are consistent with Grade I diastolic dysfunction (impaired relaxation).  2. Pacing wires in RA/RV . Right ventricular systolic function is normal. The right ventricular size is normal. There is moderately elevated pulmonary artery systolic pressure.  3. The mitral valve is degenerative. Trivial mitral valve regurgitation. No evidence of mitral stenosis.  4. Tricuspid valve regurgitation is mild to moderate.  5. Gradients have gone up since TTE done 12/17/21 LVOT diameter used on this TTE 1.9 rather than 1.9 so AVA smaller Overal with mean gradient 15 DVI 0.37 thinks this is more moderate-severe AS despite calculated AVA of 0.83 cm2 . The aortic valve is tricuspid. There is severe calcifcation of the aortic valve.  There is severe thickening of the aortic valve. Aortic valve regurgitation is  not visualized. Moderate to severe aortic valve stenosis.  6. The inferior vena cava is normal in size with greater than 50% respiratory variability, suggesting right atrial pressure of 3 mmHg. FINDINGS  Left Ventricle: Left ventricular ejection fraction, by estimation, is 55 to 60%. The left ventricle has normal function. The left ventricle has no regional wall motion abnormalities. The left ventricular internal cavity size was normal in size. There is  no left ventricular hypertrophy. Left ventricular diastolic parameters are consistent with Grade I diastolic dysfunction (impaired relaxation). Right Ventricle: Pacing wires in RA/RV. The right ventricular size is normal. No increase in right ventricular wall thickness. Right ventricular systolic function is normal. There is moderately elevated pulmonary artery systolic pressure. The tricuspid regurgitant velocity is 3.35 m/s, and with an assumed right atrial pressure of 3 mmHg, the estimated right ventricular systolic pressure is 12.4 mmHg. Left Atrium: Left atrial size was normal in size. Right Atrium: Right atrial size was normal in size. Pericardium: There is no evidence of pericardial effusion. Mitral Valve: The mitral valve is degenerative in appearance. There is mild thickening of the mitral valve leaflet(s). There is mild calcification of the mitral valve leaflet(s). Mild mitral annular calcification. Trivial mitral valve regurgitation. No evidence of mitral valve stenosis. Tricuspid Valve: The tricuspid valve is normal in structure. Tricuspid valve regurgitation is mild to moderate. No evidence of tricuspid stenosis. Aortic Valve: Gradients have gone up since TTE done 12/17/21 LVOT diameter used on this TTE 1.9 rather than 1.9 so AVA smaller Overal with mean gradient 15 DVI 0.37 thinks this is more moderate-severe AS despite calculated AVA of 0.83 cm2. The aortic valve is  tricuspid. There is severe calcifcation of the aortic valve. There is severe thickening of the aortic valve. Aortic valve regurgitation is not visualized. Moderate to severe aortic stenosis is present. Aortic valve mean gradient measures 15.0 mmHg. Aortic valve peak gradient measures 32.7 mmHg. Aortic valve area, by VTI measures 0.83 cm. Pulmonic Valve: The pulmonic valve was normal in structure. Pulmonic valve regurgitation is trivial. No evidence of pulmonic stenosis. Aorta: The aortic root is normal in size and structure. Venous: The inferior vena cava is normal in size with greater than 50% respiratory variability, suggesting right atrial pressure of 3 mmHg. IAS/Shunts: No atrial level shunt detected by color flow Doppler.  LEFT VENTRICLE PLAX 2D LVIDd:         2.65 cm   Diastology LVIDs:         1.80 cm   LV e' medial:    5.55 cm/s LV PW:         0.80 cm   LV E/e' medial:  18.2 LV IVS:        1.05 cm   LV e' lateral:   7.29 cm/s LVOT diam:     1.70 cm   LV E/e' lateral: 13.9 LV SV:         44 LV SV Index:   24 LVOT Area:     2.27 cm  RIGHT VENTRICLE RV S prime:     8.81 cm/s TAPSE (M-mode): 1.9 cm LEFT ATRIUM             Index        RIGHT ATRIUM           Index LA diam:        2.90 cm 1.61 cm/m   RA Area:     18.60 cm LA Vol (A2C):   82.3 ml 45.77 ml/m  RA  Volume:   51.00 ml  28.37 ml/m LA Vol (A4C):   46.5 ml 25.86 ml/m LA Biplane Vol: 65.0 ml 36.15 ml/m  AORTIC VALVE AV Area (Vmax):    0.76 cm AV Area (Vmean):   0.88 cm AV Area (VTI):     0.83 cm AV Vmax:           286.00 cm/s AV Vmean:          173.000 cm/s AV VTI:            0.530 m AV Peak Grad:      32.7 mmHg AV Mean Grad:      15.0 mmHg LVOT Vmax:         96.00 cm/s LVOT Vmean:        67.300 cm/s LVOT VTI:          0.194 m LVOT/AV VTI ratio: 0.37  AORTA Ao Root diam: 3.30 cm Ao Asc diam:  3.30 cm MITRAL VALVE                TRICUSPID VALVE MV Area (PHT): 4.15 cm     TR Peak grad:   44.9 mmHg MV Decel Time: 183 msec     TR Vmax:        335.00  cm/s MV E velocity: 101.00 cm/s MV A velocity: 105.00 cm/s  SHUNTS MV E/A ratio:  0.96         Systemic VTI:  0.19 m                             Systemic Diam: 1.70 cm Jenkins Rouge MD Electronically signed by Jenkins Rouge MD Signature Date/Time: 11/12/2022/10:56:29 AM    Final    CT Angio Chest PE W and/or Wo Contrast  Result Date: 11/11/2022 CLINICAL DATA:  Nausea and vomiting, history of UTI, hypertension EXAM: CT ANGIOGRAPHY CHEST CT ABDOMEN AND PELVIS WITH CONTRAST TECHNIQUE: Multidetector CT imaging of the chest was performed using the standard protocol during bolus administration of intravenous contrast. Multiplanar CT image reconstructions and MIPs were obtained to evaluate the vascular anatomy. Multidetector CT imaging of the abdomen and pelvis was performed using the standard protocol during bolus administration of intravenous contrast. RADIATION DOSE REDUCTION: This exam was performed according to the departmental dose-optimization program which includes automated exposure control, adjustment of the mA and/or kV according to patient size and/or use of iterative reconstruction technique. CONTRAST:  51m OMNIPAQUE IOHEXOL 350 MG/ML SOLN COMPARISON:  09/20/2022 FINDINGS: CTA CHEST FINDINGS Cardiovascular: This is a technically adequate evaluation of the pulmonary vasculature. No filling defects or pulmonary emboli. Mild cardiomegaly without pericardial effusion. Reflux of contrast into the hepatic veins suggests underlying cardiac dysfunction. No evidence of thoracic aortic aneurysm or dissection. Atherosclerosis of the aorta and native coronary vasculature unchanged. Prior CABG. Mediastinum/Nodes: No enlarged mediastinal, hilar, or axillary lymph nodes. Thyroid gland, trachea, and esophagus demonstrate no significant findings. Lungs/Pleura: Trace bilateral pleural effusions, left greater than right. There is interlobular septal thickening, with patchy areas of ground-glass airspace disease in the upper  lung zones, favor fluid overload and developing edema. No pneumothorax. There is bilateral bronchial wall thickening greatest in the left lower lobe, with partial opacification likely reflecting mucoid impaction or aspiration. Musculoskeletal: No acute or destructive bony lesions. Severe bilateral shoulder osteoarthritis. Reconstructed images demonstrate no additional findings. Review of the MIP images confirms the above findings. CT ABDOMEN and PELVIS FINDINGS Hepatobiliary: No focal liver abnormality is  seen. No gallstones, gallbladder wall thickening, or biliary dilatation. Pancreas: Unremarkable. No pancreatic ductal dilatation or surrounding inflammatory changes. Spleen: Normal in size without focal abnormality. Adrenals/Urinary Tract: Numerous bilateral renal cortical cysts are unchanged, and do not require imaging follow-up. No urinary tract calculi or obstruction. The adrenals are unremarkable. The bladder is decompressed, limiting its evaluation. Stomach/Bowel: No bowel obstruction or ileus. Diverticulosis of the sigmoid colon without diverticulitis. Normal retrocecal appendix. No bowel wall thickening or inflammatory change. Vascular/Lymphatic: Aortic atherosclerosis. No enlarged abdominal or pelvic lymph nodes. Reproductive: Uterus and bilateral adnexa are unremarkable. Other: No free fluid or free intraperitoneal gas. Stable fat containing umbilical and supraumbilical midline ventral hernias. No bowel herniation. Musculoskeletal: Bilateral hip arthroplasties. No acute or destructive bony abnormality. Reconstructed images demonstrate no additional findings. Review of the MIP images confirms the above findings. IMPRESSION: Chest: 1. No evidence of pulmonary embolus. 2. Cardiomegaly, with reflux of contrast into the hepatic veins suggesting underlying cardiac dysfunction. 3. Trace bilateral pleural effusions, with interlobular septal thickening and scattered ground-glass airspace disease consistent with  fluid overload and developing edema. 4. Bilateral bronchial wall thickening, greatest in the left lower lobe with partial airway opacification. This could reflect mucoid impaction or aspiration. 5. Aortic Atherosclerosis (ICD10-I70.0). Coronary artery atherosclerosis. Abdomen/pelvis: 1. No acute intra-abdominal or intrapelvic process. 2. Distal colonic diverticulosis without diverticulitis. 3. Stable midline fat containing ventral and umbilical hernias. 4.  Aortic Atherosclerosis (ICD10-I70.0). Electronically Signed   By: Randa Ngo M.D.   On: 11/11/2022 18:36   CT ABDOMEN PELVIS W CONTRAST  Result Date: 11/11/2022 CLINICAL DATA:  Nausea and vomiting, history of UTI, hypertension EXAM: CT ANGIOGRAPHY CHEST CT ABDOMEN AND PELVIS WITH CONTRAST TECHNIQUE: Multidetector CT imaging of the chest was performed using the standard protocol during bolus administration of intravenous contrast. Multiplanar CT image reconstructions and MIPs were obtained to evaluate the vascular anatomy. Multidetector CT imaging of the abdomen and pelvis was performed using the standard protocol during bolus administration of intravenous contrast. RADIATION DOSE REDUCTION: This exam was performed according to the departmental dose-optimization program which includes automated exposure control, adjustment of the mA and/or kV according to patient size and/or use of iterative reconstruction technique. CONTRAST:  8m OMNIPAQUE IOHEXOL 350 MG/ML SOLN COMPARISON:  09/20/2022 FINDINGS: CTA CHEST FINDINGS Cardiovascular: This is a technically adequate evaluation of the pulmonary vasculature. No filling defects or pulmonary emboli. Mild cardiomegaly without pericardial effusion. Reflux of contrast into the hepatic veins suggests underlying cardiac dysfunction. No evidence of thoracic aortic aneurysm or dissection. Atherosclerosis of the aorta and native coronary vasculature unchanged. Prior CABG. Mediastinum/Nodes: No enlarged mediastinal,  hilar, or axillary lymph nodes. Thyroid gland, trachea, and esophagus demonstrate no significant findings. Lungs/Pleura: Trace bilateral pleural effusions, left greater than right. There is interlobular septal thickening, with patchy areas of ground-glass airspace disease in the upper lung zones, favor fluid overload and developing edema. No pneumothorax. There is bilateral bronchial wall thickening greatest in the left lower lobe, with partial opacification likely reflecting mucoid impaction or aspiration. Musculoskeletal: No acute or destructive bony lesions. Severe bilateral shoulder osteoarthritis. Reconstructed images demonstrate no additional findings. Review of the MIP images confirms the above findings. CT ABDOMEN and PELVIS FINDINGS Hepatobiliary: No focal liver abnormality is seen. No gallstones, gallbladder wall thickening, or biliary dilatation. Pancreas: Unremarkable. No pancreatic ductal dilatation or surrounding inflammatory changes. Spleen: Normal in size without focal abnormality. Adrenals/Urinary Tract: Numerous bilateral renal cortical cysts are unchanged, and do not require imaging follow-up. No urinary tract calculi or obstruction.  The adrenals are unremarkable. The bladder is decompressed, limiting its evaluation. Stomach/Bowel: No bowel obstruction or ileus. Diverticulosis of the sigmoid colon without diverticulitis. Normal retrocecal appendix. No bowel wall thickening or inflammatory change. Vascular/Lymphatic: Aortic atherosclerosis. No enlarged abdominal or pelvic lymph nodes. Reproductive: Uterus and bilateral adnexa are unremarkable. Other: No free fluid or free intraperitoneal gas. Stable fat containing umbilical and supraumbilical midline ventral hernias. No bowel herniation. Musculoskeletal: Bilateral hip arthroplasties. No acute or destructive bony abnormality. Reconstructed images demonstrate no additional findings. Review of the MIP images confirms the above findings. IMPRESSION:  Chest: 1. No evidence of pulmonary embolus. 2. Cardiomegaly, with reflux of contrast into the hepatic veins suggesting underlying cardiac dysfunction. 3. Trace bilateral pleural effusions, with interlobular septal thickening and scattered ground-glass airspace disease consistent with fluid overload and developing edema. 4. Bilateral bronchial wall thickening, greatest in the left lower lobe with partial airway opacification. This could reflect mucoid impaction or aspiration. 5. Aortic Atherosclerosis (ICD10-I70.0). Coronary artery atherosclerosis. Abdomen/pelvis: 1. No acute intra-abdominal or intrapelvic process. 2. Distal colonic diverticulosis without diverticulitis. 3. Stable midline fat containing ventral and umbilical hernias. 4.  Aortic Atherosclerosis (ICD10-I70.0). Electronically Signed   By: Randa Ngo M.D.   On: 11/11/2022 18:36   DG Chest 2 View  Result Date: 11/11/2022 CLINICAL DATA:  Infection.  On antibiotics.  Shortness of breath EXAM: CHEST - 2 VIEW COMPARISON:  X-ray 11/24/2020.  CT 09/20/2022 FINDINGS: Left chest pacemaker. Status post median sternotomy. Calcified aorta. Increasing left lung base opacity with tiny left effusion. Diffuse interstitial changes are seen with apical pleural thickening. Stable thickening along the minor fissure as well. Degenerative changes of the spine. Advanced degenerative changes of the shoulders. IMPRESSION: Postop chest with increasing opacity and tiny effusion at the left lung base. Possible infiltrate. Recommend follow-up. Electronically Signed   By: Jill Side M.D.   On: 11/11/2022 14:24   CUP PACEART REMOTE DEVICE CHECK  Result Date: 11/10/2022 Scheduled remote reviewed. Normal device function.  Known PAF, on OAC, AF burden is 1.2% of the time.  Longest episode was 6 hours and 32 minutes. Next remote 91 days. Kathy Breach, RN, CCDS, [employer]   Microbiology: Results for orders placed or performed during the hospital encounter of  11/28/22  Resp panel by RT-PCR (RSV, Flu A&B, Covid) Anterior Nasal Swab     Status: Abnormal   Collection Time: 11/28/22  2:12 PM   Specimen: Anterior Nasal Swab  Result Value Ref Range Status   SARS Coronavirus 2 by RT PCR POSITIVE (A) NEGATIVE Final    Comment: (NOTE) SARS-CoV-2 target nucleic acids are DETECTED.  The SARS-CoV-2 RNA is generally detectable in upper respiratory specimens during the acute phase of infection. Positive results are indicative of the presence of the identified virus, but do not rule out bacterial infection or co-infection with other pathogens not detected by the test. Clinical correlation with patient history and other diagnostic information is necessary to determine patient infection status. The expected result is Negative.  Fact Sheet for Patients: EntrepreneurPulse.com.au  Fact Sheet for Healthcare Providers: [employer]  This test is not yet approved or cleared by the [place] FDA and  has been authorized for detection and/or diagnosis of SARS-CoV-2 by FDA under an Emergency Use Authorization (EUA).  This EUA will remain in effect (meaning this test can be used) for the duration of  the COVID-19 declaration under Section 564(b)(1) of the A ct, 21 U.S.C. section 360bbb-3(b)(1), unless the authorization is terminated or  revoked sooner.     Influenza A by PCR NEGATIVE NEGATIVE Final   Influenza B by PCR NEGATIVE NEGATIVE Final    Comment: (NOTE) The Xpert Xpress SARS-CoV-2/FLU/RSV plus assay is intended as an aid in the diagnosis of influenza from Nasopharyngeal swab specimens and should not be used as a sole basis for treatment. Nasal washings and aspirates are unacceptable for Xpert Xpress SARS-CoV-2/FLU/RSV testing.  Fact Sheet for Patients: EntrepreneurPulse.com.au  Fact Sheet for Healthcare Providers: IncredibleEmployment.be  This test is not yet  approved or cleared by the [place] FDA and has been authorized for detection and/or diagnosis of SARS-CoV-2 by FDA under an Emergency Use Authorization (EUA). This EUA will remain in effect (meaning this test can be used) for the duration of the COVID-19 declaration under Section 564(b)(1) of the Act, 21 U.S.C. section 360bbb-3(b)(1), unless the authorization is terminated or revoked.     Resp Syncytial Virus by PCR NEGATIVE NEGATIVE Final    Comment: (NOTE) Fact Sheet for Patients: EntrepreneurPulse.com.au  Fact Sheet for Healthcare Providers: IncredibleEmployment[employer]be  This test is not yet approved or cleared by the [place] FDA and has been authorized for detection and/or diagnosis of SARS-CoV-2 by FDA under an Emergency Use Authorization (EUA). This EUA will remain in effect (meaning this test can be used) for the duration of the COVID-19 declaration under Section 564(b)(1) of the Act, 21 U.S.C. section 360bbb-3(b)(1), unless the authorization is terminated or revoked.  Performed at [facility], [address] [place], [place] [address]   Blood culture (routine x 2)     Status: None (Preliminary result)   Collection Time: 11/28/22  2:36 PM   Specimen: BLOOD  Result Value Ref Range Status   Specimen Description   Final    BLOOD BLOOD LEFT HAND AEROBIC BOTTLE ONLY Performed at [facility], [address], [place], [place] [address]    Special Requests   Final    NONE Performed at [facility], [address], [place], [place] [address]    Culture   Final    NO GROWTH 2 DAYS Performed at [facility], [place] [address] [place], [place] [address]    Report Status PENDING  Incomplete  Blood culture (routine x 2)     Status: None (Preliminary result)   Collection Time: 11/28/22  2:36 PM   Specimen: BLOOD  Result Value Ref Range Status   Specimen Description   Final    BLOOD BLOOD RIGHT ARM AEROBIC BOTTLE ONLY Performed at  [facility], [address], [place], [place] [address]    Special Requests   Final    NONE Performed at [facility], [address] [place], [place] [address]    Culture   Final    NO GROWTH 2 DAYS Performed at [facility], [place] [address], [place],  [address]    Report Status PENDING  Incomplete    Labs: CBC: Recent Labs  Lab 11/28/22 1436 11/29/22 0437 11/30/22 0450 12/01/22 0449  WBC 5.9 4.2 3.9* 6.5  NEUTROABS  --  3.5 3.1 5.6  HGB 10.2* 10.9* 10.1* 10.3*  HCT 33.9* 36.2 33.3* 34.0*  MCV 86.7 86.8 85.2 85.2  PLT 214 195 200 397   Basic Metabolic Panel: Recent Labs  Lab 11/28/22 1436 11/28/22 1948 11/29/22 0437 11/30/22 0450 12/01/22 0449  NA 135  --  137 135 135  K 4.0  --  3.9 3.9 3.6  CL 98  --  103 104 100  CO2 29  --  '23 24 25  '$ GLUCOSE 94  --  149* 201* 164*  BUN 25*  --  24* 27* 30*  CREATININE 1.50*  --  1.31* 1.11* 1.08*  CALCIUM 9.0  --  8.6* 9.0 8.8*  MG  --  2.0  --   --   --    Liver Function Tests: Recent Labs  Lab 11/28/22 1436 11/29/22 0437 11/30/22 0450 12/01/22 0449  AST 27 31 32 30  ALT '13 14 15 13  '$ ALKPHOS 81 89 85 78  BILITOT 0.6 0.5 0.4 0.3  PROT 6.2* 6.1* 5.9* 6.3*  ALBUMIN 3.0* 2.9* 2.8* 3.0*   CBG: Recent Labs  Lab 11/30/22 2030  GLUCAP 234*    Discharge time spent: greater than 30 minutes.  Signed: Deatra James, MD [facility] 12/01/2022

## 2022-12-01 NOTE — TOC Progression Note (Signed)
Transition of Care [facility]) - Progression Note    Patient Details  Name: Amy [name] MRN: [id number] Date of Birth: 06-08-1922  Transition of Care [facility]) CM/SW Contact  Ihor Gully, LCSW Phone Number: 12/01/2022, 11:15 AM  Clinical Narrative:    [facility] accepts referral for [facility], RN, PT, aide.    Expected Discharge Plan: [place] Barriers to Discharge: No Barriers Identified  Expected Discharge Plan and Services In-house Referral: Clinical Social Work   Post Acute Care Choice: [place] Living arrangements for the past 2 months: Single Family Home                           HH Arranged: PT, Nurse's Aide, RN [place] Agency: [place] ([facility]) Date HH Agency Contacted: 12/01/22 Time [facility]: 1115 Representative spoke with at [place]: [place] ([place]) Interventions SDOH Screenings   Food Insecurity: No Food Insecurity (11/29/2022)  Housing: Low Risk  (11/29/2022)  Transportation Needs: No Transportation Needs (11/29/2022)  Utilities: Not At Risk (11/29/2022)  Tobacco Use: Low Risk  (11/28/2022)    Readmission Risk Interventions    11/14/2022    1:02 PM  Readmission Risk Prevention Plan  Transportation Screening Complete  PCP or Specialist Appt within 5-7 Days Not Complete  Home Care Screening Complete  Medication Review (RN CM) Complete

## 2022-12-02 DIAGNOSIS — I4892 Unspecified atrial flutter: Secondary | ICD-10-CM | POA: Diagnosis not present

## 2022-12-02 DIAGNOSIS — I4819 Other persistent atrial fibrillation: Secondary | ICD-10-CM | POA: Diagnosis not present

## 2022-12-02 DIAGNOSIS — N1832 Chronic kidney disease, stage 3b: Secondary | ICD-10-CM | POA: Diagnosis not present

## 2022-12-02 DIAGNOSIS — I5033 Acute on chronic diastolic (congestive) heart failure: Secondary | ICD-10-CM | POA: Diagnosis not present

## 2022-12-02 DIAGNOSIS — I130 Hypertensive heart and chronic kidney disease with heart failure and stage 1 through stage 4 chronic kidney disease, or unspecified chronic kidney disease: Secondary | ICD-10-CM | POA: Diagnosis not present

## 2022-12-02 DIAGNOSIS — I25110 Atherosclerotic heart disease of native coronary artery with unstable angina pectoris: Secondary | ICD-10-CM | POA: Diagnosis not present

## 2022-12-03 LAB — CULTURE, BLOOD (ROUTINE X 2)
Culture: NO GROWTH
Culture: NO GROWTH

## 2022-12-07 DIAGNOSIS — I5033 Acute on chronic diastolic (congestive) heart failure: Secondary | ICD-10-CM | POA: Diagnosis not present

## 2022-12-07 DIAGNOSIS — I130 Hypertensive heart and chronic kidney disease with heart failure and stage 1 through stage 4 chronic kidney disease, or unspecified chronic kidney disease: Secondary | ICD-10-CM | POA: Diagnosis not present

## 2022-12-07 DIAGNOSIS — I25110 Atherosclerotic heart disease of native coronary artery with unstable angina pectoris: Secondary | ICD-10-CM | POA: Diagnosis not present

## 2022-12-07 DIAGNOSIS — I4892 Unspecified atrial flutter: Secondary | ICD-10-CM | POA: Diagnosis not present

## 2022-12-07 DIAGNOSIS — I4819 Other persistent atrial fibrillation: Secondary | ICD-10-CM | POA: Diagnosis not present

## 2022-12-07 DIAGNOSIS — N1832 Chronic kidney disease, stage 3b: Secondary | ICD-10-CM | POA: Diagnosis not present

## 2022-12-08 DIAGNOSIS — I130 Hypertensive heart and chronic kidney disease with heart failure and stage 1 through stage 4 chronic kidney disease, or unspecified chronic kidney disease: Secondary | ICD-10-CM | POA: Diagnosis not present

## 2022-12-08 DIAGNOSIS — I25110 Atherosclerotic heart disease of native coronary artery with unstable angina pectoris: Secondary | ICD-10-CM | POA: Diagnosis not present

## 2022-12-08 DIAGNOSIS — I4892 Unspecified atrial flutter: Secondary | ICD-10-CM | POA: Diagnosis not present

## 2022-12-08 DIAGNOSIS — N1832 Chronic kidney disease, stage 3b: Secondary | ICD-10-CM | POA: Diagnosis not present

## 2022-12-08 DIAGNOSIS — I5033 Acute on chronic diastolic (congestive) heart failure: Secondary | ICD-10-CM | POA: Diagnosis not present

## 2022-12-08 DIAGNOSIS — I4819 Other persistent atrial fibrillation: Secondary | ICD-10-CM | POA: Diagnosis not present

## 2022-12-09 DIAGNOSIS — I5033 Acute on chronic diastolic (congestive) heart failure: Secondary | ICD-10-CM | POA: Diagnosis not present

## 2022-12-09 DIAGNOSIS — R300 Dysuria: Secondary | ICD-10-CM | POA: Diagnosis not present

## 2022-12-09 DIAGNOSIS — I130 Hypertensive heart and chronic kidney disease with heart failure and stage 1 through stage 4 chronic kidney disease, or unspecified chronic kidney disease: Secondary | ICD-10-CM | POA: Diagnosis not present

## 2022-12-09 DIAGNOSIS — I4819 Other persistent atrial fibrillation: Secondary | ICD-10-CM | POA: Diagnosis not present

## 2022-12-09 DIAGNOSIS — F17210 Nicotine dependence, cigarettes, uncomplicated: Secondary | ICD-10-CM | POA: Diagnosis not present

## 2022-12-09 DIAGNOSIS — I4892 Unspecified atrial flutter: Secondary | ICD-10-CM | POA: Diagnosis not present

## 2022-12-09 DIAGNOSIS — I25110 Atherosclerotic heart disease of native coronary artery with unstable angina pectoris: Secondary | ICD-10-CM | POA: Diagnosis not present

## 2022-12-09 DIAGNOSIS — N1832 Chronic kidney disease, stage 3b: Secondary | ICD-10-CM | POA: Diagnosis not present

## 2022-12-12 DIAGNOSIS — I4819 Other persistent atrial fibrillation: Secondary | ICD-10-CM | POA: Diagnosis not present

## 2022-12-12 DIAGNOSIS — I5033 Acute on chronic diastolic (congestive) heart failure: Secondary | ICD-10-CM | POA: Diagnosis not present

## 2022-12-12 DIAGNOSIS — I130 Hypertensive heart and chronic kidney disease with heart failure and stage 1 through stage 4 chronic kidney disease, or unspecified chronic kidney disease: Secondary | ICD-10-CM | POA: Diagnosis not present

## 2022-12-12 DIAGNOSIS — I25110 Atherosclerotic heart disease of native coronary artery with unstable angina pectoris: Secondary | ICD-10-CM | POA: Diagnosis not present

## 2022-12-12 DIAGNOSIS — N1832 Chronic kidney disease, stage 3b: Secondary | ICD-10-CM | POA: Diagnosis not present

## 2022-12-12 DIAGNOSIS — I4892 Unspecified atrial flutter: Secondary | ICD-10-CM | POA: Diagnosis not present

## 2022-12-13 DIAGNOSIS — I5033 Acute on chronic diastolic (congestive) heart failure: Secondary | ICD-10-CM | POA: Diagnosis not present

## 2022-12-13 DIAGNOSIS — I4892 Unspecified atrial flutter: Secondary | ICD-10-CM | POA: Diagnosis not present

## 2022-12-13 DIAGNOSIS — N1832 Chronic kidney disease, stage 3b: Secondary | ICD-10-CM | POA: Diagnosis not present

## 2022-12-13 DIAGNOSIS — I130 Hypertensive heart and chronic kidney disease with heart failure and stage 1 through stage 4 chronic kidney disease, or unspecified chronic kidney disease: Secondary | ICD-10-CM | POA: Diagnosis not present

## 2022-12-13 DIAGNOSIS — I25110 Atherosclerotic heart disease of native coronary artery with unstable angina pectoris: Secondary | ICD-10-CM | POA: Diagnosis not present

## 2022-12-13 DIAGNOSIS — I4819 Other persistent atrial fibrillation: Secondary | ICD-10-CM | POA: Diagnosis not present

## 2022-12-15 DIAGNOSIS — I4892 Unspecified atrial flutter: Secondary | ICD-10-CM | POA: Diagnosis not present

## 2022-12-15 DIAGNOSIS — N1832 Chronic kidney disease, stage 3b: Secondary | ICD-10-CM | POA: Diagnosis not present

## 2022-12-15 DIAGNOSIS — I5033 Acute on chronic diastolic (congestive) heart failure: Secondary | ICD-10-CM | POA: Diagnosis not present

## 2022-12-15 DIAGNOSIS — I4819 Other persistent atrial fibrillation: Secondary | ICD-10-CM | POA: Diagnosis not present

## 2022-12-15 DIAGNOSIS — I130 Hypertensive heart and chronic kidney disease with heart failure and stage 1 through stage 4 chronic kidney disease, or unspecified chronic kidney disease: Secondary | ICD-10-CM | POA: Diagnosis not present

## 2022-12-15 DIAGNOSIS — I25110 Atherosclerotic heart disease of native coronary artery with unstable angina pectoris: Secondary | ICD-10-CM | POA: Diagnosis not present

## 2022-12-16 DIAGNOSIS — I130 Hypertensive heart and chronic kidney disease with heart failure and stage 1 through stage 4 chronic kidney disease, or unspecified chronic kidney disease: Secondary | ICD-10-CM | POA: Diagnosis not present

## 2022-12-16 DIAGNOSIS — N1832 Chronic kidney disease, stage 3b: Secondary | ICD-10-CM | POA: Diagnosis not present

## 2022-12-16 DIAGNOSIS — I4819 Other persistent atrial fibrillation: Secondary | ICD-10-CM | POA: Diagnosis not present

## 2022-12-16 DIAGNOSIS — I25110 Atherosclerotic heart disease of native coronary artery with unstable angina pectoris: Secondary | ICD-10-CM | POA: Diagnosis not present

## 2022-12-16 DIAGNOSIS — I272 Pulmonary hypertension, unspecified: Secondary | ICD-10-CM | POA: Diagnosis not present

## 2022-12-16 DIAGNOSIS — I5033 Acute on chronic diastolic (congestive) heart failure: Secondary | ICD-10-CM | POA: Diagnosis not present

## 2022-12-16 DIAGNOSIS — I4892 Unspecified atrial flutter: Secondary | ICD-10-CM | POA: Diagnosis not present

## 2022-12-19 DIAGNOSIS — I4819 Other persistent atrial fibrillation: Secondary | ICD-10-CM | POA: Diagnosis not present

## 2022-12-19 DIAGNOSIS — I25110 Atherosclerotic heart disease of native coronary artery with unstable angina pectoris: Secondary | ICD-10-CM | POA: Diagnosis not present

## 2022-12-19 DIAGNOSIS — I5033 Acute on chronic diastolic (congestive) heart failure: Secondary | ICD-10-CM | POA: Diagnosis not present

## 2022-12-19 DIAGNOSIS — I130 Hypertensive heart and chronic kidney disease with heart failure and stage 1 through stage 4 chronic kidney disease, or unspecified chronic kidney disease: Secondary | ICD-10-CM | POA: Diagnosis not present

## 2022-12-19 DIAGNOSIS — I4892 Unspecified atrial flutter: Secondary | ICD-10-CM | POA: Diagnosis not present

## 2022-12-19 DIAGNOSIS — N1832 Chronic kidney disease, stage 3b: Secondary | ICD-10-CM | POA: Diagnosis not present

## 2022-12-20 DIAGNOSIS — N1832 Chronic kidney disease, stage 3b: Secondary | ICD-10-CM | POA: Diagnosis not present

## 2022-12-20 DIAGNOSIS — N390 Urinary tract infection, site not specified: Secondary | ICD-10-CM | POA: Diagnosis not present

## 2022-12-20 DIAGNOSIS — Z602 Problems related to living alone: Secondary | ICD-10-CM | POA: Diagnosis not present

## 2022-12-20 DIAGNOSIS — I4819 Other persistent atrial fibrillation: Secondary | ICD-10-CM | POA: Diagnosis not present

## 2022-12-20 DIAGNOSIS — Z950 Presence of cardiac pacemaker: Secondary | ICD-10-CM | POA: Diagnosis not present

## 2022-12-20 DIAGNOSIS — I4892 Unspecified atrial flutter: Secondary | ICD-10-CM | POA: Diagnosis not present

## 2022-12-20 DIAGNOSIS — M19012 Primary osteoarthritis, left shoulder: Secondary | ICD-10-CM | POA: Diagnosis not present

## 2022-12-20 DIAGNOSIS — I6522 Occlusion and stenosis of left carotid artery: Secondary | ICD-10-CM | POA: Diagnosis not present

## 2022-12-20 DIAGNOSIS — J45909 Unspecified asthma, uncomplicated: Secondary | ICD-10-CM | POA: Diagnosis not present

## 2022-12-20 DIAGNOSIS — I5033 Acute on chronic diastolic (congestive) heart failure: Secondary | ICD-10-CM | POA: Diagnosis not present

## 2022-12-20 DIAGNOSIS — I700 Atherosclerosis of aorta: Secondary | ICD-10-CM | POA: Diagnosis not present

## 2022-12-20 DIAGNOSIS — Z7901 Long term (current) use of anticoagulants: Secondary | ICD-10-CM | POA: Diagnosis not present

## 2022-12-20 DIAGNOSIS — B964 Proteus (mirabilis) (morganii) as the cause of diseases classified elsewhere: Secondary | ICD-10-CM | POA: Diagnosis not present

## 2022-12-20 DIAGNOSIS — I272 Pulmonary hypertension, unspecified: Secondary | ICD-10-CM | POA: Diagnosis not present

## 2022-12-20 DIAGNOSIS — F419 Anxiety disorder, unspecified: Secondary | ICD-10-CM | POA: Diagnosis not present

## 2022-12-20 DIAGNOSIS — I130 Hypertensive heart and chronic kidney disease with heart failure and stage 1 through stage 4 chronic kidney disease, or unspecified chronic kidney disease: Secondary | ICD-10-CM | POA: Diagnosis not present

## 2022-12-20 DIAGNOSIS — M19011 Primary osteoarthritis, right shoulder: Secondary | ICD-10-CM | POA: Diagnosis not present

## 2022-12-20 DIAGNOSIS — Z9181 History of falling: Secondary | ICD-10-CM | POA: Diagnosis not present

## 2022-12-20 DIAGNOSIS — Z79899 Other long term (current) drug therapy: Secondary | ICD-10-CM | POA: Diagnosis not present

## 2022-12-20 DIAGNOSIS — Z86718 Personal history of other venous thrombosis and embolism: Secondary | ICD-10-CM | POA: Diagnosis not present

## 2022-12-20 DIAGNOSIS — I25110 Atherosclerotic heart disease of native coronary artery with unstable angina pectoris: Secondary | ICD-10-CM | POA: Diagnosis not present

## 2022-12-20 DIAGNOSIS — D509 Iron deficiency anemia, unspecified: Secondary | ICD-10-CM | POA: Diagnosis not present

## 2022-12-20 DIAGNOSIS — E039 Hypothyroidism, unspecified: Secondary | ICD-10-CM | POA: Diagnosis not present

## 2022-12-20 DIAGNOSIS — I495 Sick sinus syndrome: Secondary | ICD-10-CM | POA: Diagnosis not present

## 2022-12-20 DIAGNOSIS — I088 Other rheumatic multiple valve diseases: Secondary | ICD-10-CM | POA: Diagnosis not present

## 2022-12-21 DIAGNOSIS — I4819 Other persistent atrial fibrillation: Secondary | ICD-10-CM | POA: Diagnosis not present

## 2022-12-21 DIAGNOSIS — N1832 Chronic kidney disease, stage 3b: Secondary | ICD-10-CM | POA: Diagnosis not present

## 2022-12-21 DIAGNOSIS — I25110 Atherosclerotic heart disease of native coronary artery with unstable angina pectoris: Secondary | ICD-10-CM | POA: Diagnosis not present

## 2022-12-21 DIAGNOSIS — I4892 Unspecified atrial flutter: Secondary | ICD-10-CM | POA: Diagnosis not present

## 2022-12-21 DIAGNOSIS — I130 Hypertensive heart and chronic kidney disease with heart failure and stage 1 through stage 4 chronic kidney disease, or unspecified chronic kidney disease: Secondary | ICD-10-CM | POA: Diagnosis not present

## 2022-12-21 DIAGNOSIS — I5033 Acute on chronic diastolic (congestive) heart failure: Secondary | ICD-10-CM | POA: Diagnosis not present

## 2022-12-22 DIAGNOSIS — Z515 Encounter for palliative care: Secondary | ICD-10-CM | POA: Diagnosis not present

## 2022-12-22 DIAGNOSIS — I5032 Chronic diastolic (congestive) heart failure: Secondary | ICD-10-CM | POA: Diagnosis not present

## 2022-12-22 DIAGNOSIS — I350 Nonrheumatic aortic (valve) stenosis: Secondary | ICD-10-CM | POA: Diagnosis not present

## 2022-12-27 DIAGNOSIS — I4892 Unspecified atrial flutter: Secondary | ICD-10-CM | POA: Diagnosis not present

## 2022-12-27 DIAGNOSIS — I130 Hypertensive heart and chronic kidney disease with heart failure and stage 1 through stage 4 chronic kidney disease, or unspecified chronic kidney disease: Secondary | ICD-10-CM | POA: Diagnosis not present

## 2022-12-27 DIAGNOSIS — I25110 Atherosclerotic heart disease of native coronary artery with unstable angina pectoris: Secondary | ICD-10-CM | POA: Diagnosis not present

## 2022-12-27 DIAGNOSIS — I5033 Acute on chronic diastolic (congestive) heart failure: Secondary | ICD-10-CM | POA: Diagnosis not present

## 2022-12-27 DIAGNOSIS — N1832 Chronic kidney disease, stage 3b: Secondary | ICD-10-CM | POA: Diagnosis not present

## 2022-12-27 DIAGNOSIS — I4819 Other persistent atrial fibrillation: Secondary | ICD-10-CM | POA: Diagnosis not present

## 2022-12-30 DIAGNOSIS — N1832 Chronic kidney disease, stage 3b: Secondary | ICD-10-CM | POA: Diagnosis not present

## 2022-12-30 DIAGNOSIS — I130 Hypertensive heart and chronic kidney disease with heart failure and stage 1 through stage 4 chronic kidney disease, or unspecified chronic kidney disease: Secondary | ICD-10-CM | POA: Diagnosis not present

## 2022-12-30 DIAGNOSIS — I25110 Atherosclerotic heart disease of native coronary artery with unstable angina pectoris: Secondary | ICD-10-CM | POA: Diagnosis not present

## 2022-12-30 DIAGNOSIS — I5033 Acute on chronic diastolic (congestive) heart failure: Secondary | ICD-10-CM | POA: Diagnosis not present

## 2022-12-30 DIAGNOSIS — I4819 Other persistent atrial fibrillation: Secondary | ICD-10-CM | POA: Diagnosis not present

## 2022-12-30 DIAGNOSIS — I4892 Unspecified atrial flutter: Secondary | ICD-10-CM | POA: Diagnosis not present

## 2022-12-31 DIAGNOSIS — R300 Dysuria: Secondary | ICD-10-CM | POA: Diagnosis not present

## 2023-01-02 DIAGNOSIS — I5033 Acute on chronic diastolic (congestive) heart failure: Secondary | ICD-10-CM | POA: Diagnosis not present

## 2023-01-02 DIAGNOSIS — I4819 Other persistent atrial fibrillation: Secondary | ICD-10-CM | POA: Diagnosis not present

## 2023-01-02 DIAGNOSIS — I4892 Unspecified atrial flutter: Secondary | ICD-10-CM | POA: Diagnosis not present

## 2023-01-02 DIAGNOSIS — I130 Hypertensive heart and chronic kidney disease with heart failure and stage 1 through stage 4 chronic kidney disease, or unspecified chronic kidney disease: Secondary | ICD-10-CM | POA: Diagnosis not present

## 2023-01-02 DIAGNOSIS — I25110 Atherosclerotic heart disease of native coronary artery with unstable angina pectoris: Secondary | ICD-10-CM | POA: Diagnosis not present

## 2023-01-02 DIAGNOSIS — N1832 Chronic kidney disease, stage 3b: Secondary | ICD-10-CM | POA: Diagnosis not present

## 2023-01-03 DIAGNOSIS — I4819 Other persistent atrial fibrillation: Secondary | ICD-10-CM | POA: Diagnosis not present

## 2023-01-03 DIAGNOSIS — I4892 Unspecified atrial flutter: Secondary | ICD-10-CM | POA: Diagnosis not present

## 2023-01-03 DIAGNOSIS — I495 Sick sinus syndrome: Secondary | ICD-10-CM | POA: Diagnosis not present

## 2023-01-03 DIAGNOSIS — I5033 Acute on chronic diastolic (congestive) heart failure: Secondary | ICD-10-CM | POA: Diagnosis not present

## 2023-01-03 DIAGNOSIS — N1832 Chronic kidney disease, stage 3b: Secondary | ICD-10-CM | POA: Diagnosis not present

## 2023-01-03 DIAGNOSIS — I700 Atherosclerosis of aorta: Secondary | ICD-10-CM | POA: Diagnosis not present

## 2023-01-03 DIAGNOSIS — I130 Hypertensive heart and chronic kidney disease with heart failure and stage 1 through stage 4 chronic kidney disease, or unspecified chronic kidney disease: Secondary | ICD-10-CM | POA: Diagnosis not present

## 2023-01-03 DIAGNOSIS — I25110 Atherosclerotic heart disease of native coronary artery with unstable angina pectoris: Secondary | ICD-10-CM | POA: Diagnosis not present

## 2023-01-23 DIAGNOSIS — I350 Nonrheumatic aortic (valve) stenosis: Secondary | ICD-10-CM | POA: Diagnosis not present

## 2023-01-23 DIAGNOSIS — I5032 Chronic diastolic (congestive) heart failure: Secondary | ICD-10-CM | POA: Diagnosis not present

## 2023-01-23 DIAGNOSIS — Z515 Encounter for palliative care: Secondary | ICD-10-CM | POA: Diagnosis not present

## 2023-01-29 DIAGNOSIS — I4891 Unspecified atrial fibrillation: Secondary | ICD-10-CM | POA: Diagnosis not present

## 2023-01-29 DIAGNOSIS — I5089 Other heart failure: Secondary | ICD-10-CM | POA: Diagnosis not present

## 2023-01-29 DIAGNOSIS — K219 Gastro-esophageal reflux disease without esophagitis: Secondary | ICD-10-CM | POA: Diagnosis not present

## 2023-02-02 ENCOUNTER — Ambulatory Visit: Payer: Medicare Other | Attending: Cardiovascular Disease | Admitting: Cardiovascular Disease

## 2023-02-02 ENCOUNTER — Encounter: Payer: Self-pay | Admitting: Cardiovascular Disease

## 2023-02-02 VITALS — BP 164/70 | HR 60 | Ht 65.0 in | Wt 159.6 lb

## 2023-02-02 DIAGNOSIS — I6522 Occlusion and stenosis of left carotid artery: Secondary | ICD-10-CM | POA: Diagnosis not present

## 2023-02-02 DIAGNOSIS — Z515 Encounter for palliative care: Secondary | ICD-10-CM | POA: Diagnosis not present

## 2023-02-02 DIAGNOSIS — I495 Sick sinus syndrome: Secondary | ICD-10-CM | POA: Diagnosis not present

## 2023-02-02 DIAGNOSIS — D6869 Other thrombophilia: Secondary | ICD-10-CM | POA: Diagnosis not present

## 2023-02-02 DIAGNOSIS — I480 Paroxysmal atrial fibrillation: Secondary | ICD-10-CM | POA: Diagnosis not present

## 2023-02-02 DIAGNOSIS — R55 Syncope and collapse: Secondary | ICD-10-CM

## 2023-02-02 DIAGNOSIS — I350 Nonrheumatic aortic (valve) stenosis: Secondary | ICD-10-CM

## 2023-02-02 DIAGNOSIS — I2510 Atherosclerotic heart disease of native coronary artery without angina pectoris: Secondary | ICD-10-CM | POA: Diagnosis not present

## 2023-02-02 DIAGNOSIS — Z950 Presence of cardiac pacemaker: Secondary | ICD-10-CM | POA: Insufficient documentation

## 2023-02-02 DIAGNOSIS — I10 Essential (primary) hypertension: Secondary | ICD-10-CM

## 2023-02-02 DIAGNOSIS — E7801 Familial hypercholesterolemia: Secondary | ICD-10-CM | POA: Insufficient documentation

## 2023-02-02 DIAGNOSIS — I5032 Chronic diastolic (congestive) heart failure: Secondary | ICD-10-CM | POA: Insufficient documentation

## 2023-02-02 DIAGNOSIS — N1832 Chronic kidney disease, stage 3b: Secondary | ICD-10-CM | POA: Diagnosis not present

## 2023-02-02 NOTE — Progress Notes (Signed)
Cardiology Office Note    Date:  02/07/2023   ID:  Amy [name], DOB Feb 03, 1922, MRN [id number]  PCP:  [facility]  Cardiologist:  Thurmon Fair, MD   Electrophysiologist:  None   Evaluation Performed:  Follow-Up Visit  Chief Complaint:  syncope  History of Present Illness:    Amy [name] is a 89 y.o. female with history of severe aortic stenosis, coronary artery disease and previous bypass surgery, sinus node dysfunction and persistent atrial arrhythmia, history of left inferior hemianopsia due to retinal artery occlusion.  She underwent a pacemaker generator change out in 2019 (Medtronic Azure XT, original leads MDT 5076 from 2010).  She has stage III chronic kidney disease and moderate carotid stenosis.  She has severe mixed hyperlipidemia intolerant of statins, on Repatha.  She has had several episodes of syncope related to bowel movements.  Hospitalized in January with heart failure exacerbation during COVID-19 infection and during that hospitalization had another episode of syncope related to a bowel movement, but no syncopal events since then.  She occasionally has severe bleeding from hemorrhoids and will intermittently stop taking her Eliquis with resolution of the bleeding.  She moves around very carefully with a walker.  She has not had any falls since January.  Echocardiogram 11/12/2022 shows that she still has normal left ventricular systolic function with an EF of 55-60%, but there are increased gradients across the aortic valve, now moderate-severe aortic stenosis, mean gradient 15 mmHg with calculated AVA area 0.8 cm.,  Dimensionless index 0.37, stroke-volume index 24 mL/meters squared, grossly consistent with paradoxical low-flow low gradient severe aortic stenosis.  She has not had any chest pain.  She does not have orthopnea or PND, NYHA functional class III exertional dyspnea.  She has mild leg edema.  She is not aware of any palpitations.  She has not  had any new focal neurological events.  Denies chest pain.  Both Amy and her daughter Amy [name] have come closer to understanding that due to her advanced medical age and multiple medical problems, Amy [name] is likely approaching months or few years of her life.  Pacemaker interrogation shows normal device function.  Her burden of atrial fibrillation is quite low at 0.7%, maximum 10-hour episode, well rate controlled.  She has 67% atrial pacing and only 0.6% ventricular pacing.  Generator longevity is estimated at almost 9 years.  Most recent metabolic parameters from 06/11/2022 show HDL 55, LDL 89, triglycerides 142, total cholesterol 138, hemoglobin A1c most recently was 6.3%.  Mild anemia with hemoglobin stable around 10-11.  In 2009 she had an acute inferior wall ST segment elevation myocardial infarction treated with urgent angioplasty and placement of a bare-metal stent. Subsequently, she underwent multivessel bypass surgery with LIMA to LAD, SVG to ramus SVG to RCA. She has no evidence of residual myocardial injury and has preserved left ventricular systolic function. In June 2015, cardiac catheterization showed 90% proximal and mid LAD with patent distal LIMA bypass; 40% ostial circumflex, not bypassed; unchanged 80% proximal ramus intermedius, SVG to ramus occluded; patent stent right coronary artery, SVG to right coronary artery occluded. Her LVEF was 55%, there was no significant mitral regurgitation and the LVEDP was high normal at 15 mm Hg, (when she was short of breath). Dr. Kirke Corin stated that her dyspnea is likely multifactorial and cannot be explained completely by cardiac findings. In May 2016 she had a low risk nuclear study.    In 2010 a permanent pacemaker (Medtronic) was implanted  for symptomatic bradycardia.  She underwent a generator change out in March 2019.  In late 2013 she had a fall complicated by a hip fracture and radius fracture. She had an esophageal perforation complicated by  mediastinitis and had a feeding tube for several months. In October 2015 she had another fall in her home (no syncope, tripped at [facility]carpet transition) and fractured her pelvis and humerus.    She has fairly severe hyperlipidemia with an LDL cholesterol in excess of 200. She is statin, niacin, ezetimibe and resin intolerant. She takes Repatha.   She has systolic HTN, but also a tendency to marked orthostatic hypotension (25 mm Hg drop in SBP).   She has moderate CKD, baseline GFR around 30 mL/minute, has seen Dr. Kristian Covey in the past, but does not have regular nephrology follow-up.   July 2019 she had an episode of unilateral inferior hemianopsia.  Brief episodes of atrial fibrillation were seen around that time.  There was also a moderate left internal carotid artery stenosis.  She had persistent atrial fibrillation from late July until November 2019.     Past Medical History:  Diagnosis Date   Anxiety    Arthritis    Asthma    CAD in native artery    s/p CABG x 3; Last Myoview in 07/2009 - non-ischemic; Echo 03/2012  Aortic Sclerosis with normal EF.   CKD (chronic kidney disease), stage III    DVT (deep vein thrombosis) in pregnancy    Esophageal perforation    9/13   Fall 07/21/2014   FALL WITH INJURY   Fracture of head of humerus    Fracture of hip 07/21/2014   LEFT   GERD (gastroesophageal reflux disease)    Hypercholesteremia    Hypertension    Hypothyroidism    Loss of vision 05/22/2018   LEFT EYE   Mediastinitis    s/p drainage   Myocardial infarction    Inferior STEMI 07/2008 - PCI of prox RCA followed byu CABG x 3 in 9/'09   Pacemaker    PAF (paroxysmal atrial fibrillation)    Paroxysmal atrial flutter 11/26/2014   Pneumonia    Shortness of breath    SSS (sick sinus syndrome) 11/26/2014   Status post hip hemiarthroplasty left hip by Dr Sherlean Foot 08/08/2012   Past Surgical History:  Procedure Laterality Date   CARDIOVERSION N/A 09/21/2018   Procedure:  CARDIOVERSION;  Surgeon: Thurmon Fair, MD;  Location: [facility] ENDOSCOPY;  Service: Cardiovascular;  Laterality: N/A;   CARPAL TUNNEL RELEASE  08/08/2012   Procedure: CARPAL TUNNEL RELEASE;  Surgeon: Marlowe Shores, MD;  Location: [facility] OR;  Service: Orthopedics;  Laterality: Left;   CORONARY ARTERY BYPASS GRAFT  2009   LIMA-LAD, SVG-RI, SVG-stented RCA   ESOPHAGOGASTRODUODENOSCOPY  08/10/2012   Procedure: ESOPHAGOGASTRODUODENOSCOPY (EGD);  Surgeon: Theda Belfast, MD;  Location: [facility] ENDOSCOPY;  Service: Endoscopy;  Laterality: N/A;   ESOPHAGOSCOPY  08/10/2012   Procedure: ESOPHAGOSCOPY;  Surgeon: Flo Shanks, MD;  Location: [facility] OR;  Service: ENT;  Laterality: N/A;   GASTROSTOMY  08/16/2012   Procedure: GASTROSTOMY;  Surgeon: Liz Malady, MD;  Location: [facility] OR;  Service: General;  Laterality: N/A;   HIP ARTHROPLASTY  08/08/2012   Procedure: ARTHROPLASTY BIPOLAR HIP;  Surgeon: Raymon Mutton, MD;  Location: [facility] OR;  Service: Orthopedics;  Laterality: Left;  [place]    JOINT REPLACEMENT     LEFT HEART CATHETERIZATION WITH CORONARY ANGIOGRAM N/A 04/16/2014   Procedure: LEFT HEART CATHETERIZATION WITH CORONARY ANGIOGRAM;  Surgeon: Iran Ouch, MD;  Location: [facility] CATH LAB;  Service: Cardiovascular;  Laterality: N/A;   NM MYOCAR PERF WALL MOTION  08/14/2009   Normal   PACEMAKER INSERTION  01/06/2009   Medtronic   PPM GENERATOR CHANGEOUT N/A 01/24/2018   Procedure: PPM GENERATOR CHANGEOUT;  Surgeon: Thurmon Fair, MD;  Location: [facility] INVASIVE CV LAB;  Service: Cardiovascular;  Laterality: N/A;   TOTAL HIP ARTHROPLASTY     WRIST FRACTURE SURGERY  07/2012   left intra articular  with carpel tunnel     Current Meds  Medication Sig   acetaminophen (TYLENOL) 500 MG tablet Take 500 mg by mouth every 6 (six) hours as needed for moderate pain.   albuterol (PROVENTIL HFA;VENTOLIN HFA) 108 (90 Base) MCG/ACT inhaler Inhale 2 puffs into the lungs every 6 (six) hours as needed for wheezing or shortness of  breath.   ELIQUIS 2.5 MG TABS tablet Take 1 tablet by mouth twice daily (Patient taking differently: Take 2.5 mg by mouth 2 (two) times daily.)   furosemide (LASIX) 20 MG tablet Take 1 tablet (20 mg total) by mouth every other day.   gabapentin (NEURONTIN) 100 MG capsule Take 1 capsule (100 mg total) by mouth 2 (two) times daily as needed (neuropathy pain). (Patient taking differently: Take 100 mg by mouth daily.)   levothyroxine (SYNTHROID) 75 MCG tablet Take 1 tablet by mouth daily.   metoprolol succinate (TOPROL-XL) 25 MG 24 hr tablet Take 1 tablet by mouth once daily (Patient taking differently: Take 25 mg by mouth daily.)   omeprazole (PRILOSEC) 20 MG capsule Take 20 mg by mouth daily.   ondansetron (ZOFRAN-ODT) 4 MG disintegrating tablet Take 4 mg by mouth every 12 (twelve) hours as needed for nausea or vomiting.   TRELEGY ELLIPTA 200-62.5-25 MCG/ACT AEPB Inhale 1 puff into the lungs daily.     Allergies:   Colestipol, Hydrocodone, Niacin and related, Statins, and Zetia [ezetimibe]   Social History   Tobacco Use   Smoking status: Never   Smokeless tobacco: Never  Vaping Use   Vaping Use: Never used  Substance Use Topics   Alcohol use: No   Drug use: No     Family Hx: The patient's family history includes Stroke in her father.  ROS:   Please see the history of present illness.    All other systems are reviewed and are negative.   Prior CV studies:   The following studies were reviewed today: Comprehensive remote pacemaker check today  Labs/Other Tests and Data Reviewed:    EKG: Not ordered today.  The most recent tracing from 02/05/2022 is personally reviewed and shows atrial paced, ventricular sensed rhythm with long AV delay and QS pattern in leads V1-V2 without acute ischemic repolarization abnormalities.   Today the intracardiac electrogram shows atrial paced, ventricular sensed rhythm Recent Labs: 11/28/2022: B Natriuretic Peptide 540.0; Magnesium 2.0; TSH  0.307 12/01/2022: ALT 13; BUN 30; Creatinine, Ser 1.08; Hemoglobin 10.3; Platelets 214; Potassium 3.6; Sodium 135   Recent Lipid Panel Lab Results  Component Value Date/Time   CHOL 193 08/13/2018 09:55 AM   TRIG 153 (H) 08/13/2018 09:55 AM   HDL 51 08/13/2018 09:55 AM   CHOLHDL 3.8 08/13/2018 09:55 AM   CHOLHDL 6.0 05/23/2018 08:56 AM   LDLCALC 111 (H) 08/13/2018 09:55 AM   12/02/2021  HDL 54, LDL 89, total cholesterol 161, triglycerides 194.   The most recent hemoglobin A1c was 6.3%.   Wt Readings from Last 3 Encounters:  02/02/23 159  lb 9.6 oz (72.4 kg)  11/28/22 160 lb (72.6 kg)  11/17/22 166 lb 10.7 oz (75.6 kg)     Objective:    Vital Signs:  BP (!) 164/70 (BP Location: Right Arm, Patient Position: Sitting, Cuff Size: Normal)   Pulse 60   Ht 5\' 5"  (1.651 m)   Wt 159 lb 9.6 oz (72.4 kg)   SpO2 94%   BMI 26.56 kg/m     General: Alert, oriented x3, no distress, healthy left subclavian pacemaker. Head: no evidence of trauma, PERRL, EOMI, no exophtalmos or lid lag, no myxedema, no xanthelasma; normal ears, nose and oropharynx Neck: normal jugular venous pulsations and no hepatojugular reflux; brisk carotid pulses without delay and no carotid bruits Chest: clear to auscultation, no signs of consolidation by percussion or palpation, normal fremitus, symmetrical and full respiratory excursions Cardiovascular: normal position and quality of the apical impulse, regular rhythm, normal first and second heart sounds, 3/6 aortic ejection murmur, no diastolic murmurs, rubs or gallops Abdomen: no tenderness or distention, no masses by palpation, no abnormal pulsatility or arterial bruits, normal bowel sounds, no hepatosplenomegaly Extremities: no clubbing, cyanosis or edema; 2+ radial, ulnar and brachial pulses bilaterally; 2+ right femoral, posterior tibial and dorsalis pedis pulses; 2+ left femoral, posterior tibial and dorsalis pedis pulses; no subclavian or femoral bruits Neurological:  grossly nonfocal Psych: Normal mood and affect     ASSESSMENT & PLAN:    Syncope: None since January.  This has been consistently associated with bowel movements.  Suspect is due to straining and vagal reflexes.  There is no evidence of arrhythmia on her pacemaker to explain it.  Deconditioning is playing a big role in her falls.  BP is high today, but recently was in the 120s/60s.  Avoid "perfect" blood pressure control to reduce risk of syncope. CHF: Appears clinically euvolemic.  Continues to have NYHA functional class 2-3 exertional dyspnea.   Avoiding SGLT2 inhibitors due to history of frequent urinary tract infections.  CAD s/p CABG: Denies angina pectoris.  Normal left ventricular systolic function no regional wall motion abnormalities on echo.  She may have a significant coronary problem causing her dyspnea, but we are trying to be conservative with her management at age 84. AS: The findings on the echo are concerning for paradoxical low-flow low gradient severe aortic stenosis, but I do not think she is a candidate for AVR, even TAVR due to her advanced age and multiple comorbid conditions.  Syncopal events have not been associated with exertion, but rather with straining during bowel movements. AFib: Although she has episodes that last for a few hours at a time the overall burden of arrhythmia is very low and rate control is appropriate.  On anticoagulation, CHADSVasc 8 (age 46, CVA 2, gender, HF, PAD/CAD, HTN).   Eliquis: OK to hold the anticoagulant during episodes of rectal bleeding. SSS: Appropriate heart rate histogram distribution with the current sensor settings. MV:HQIONG device function, continue remote downloads every 3 months. HTN: BP is high today, but recently was in the 120s/60s.  Avoid "perfect" blood pressure control to reduce risk of syncope. HLP: Although LDL is not quite at target it is the best we can probably achieve.  Severe cholesterol elevation with serious  atherosclerotic disease, consistent with familial heterozygous hypercholesterolemia. LDL cholesterol remains elevated, but markedly improved compared to her baseline. Intolerant to statins/ezetimibe/resins but did well on Repatha.  The focus of her health care is shifting from long-term management to palliation of symptoms. Left internal  carotid artery stenosis: Medical management. No recent focal neurological complaints. CKD 3b: Recently her renal parameters have been better than previously estimated. Palliative care: They have involved the palliative care team.  At some point in the future may be appropriate to consider home hospice. Both Amy and her daughter Amy [name] have come closer to understanding that due to her advanced medical age and multiple medical problems, Amy [name] is likely approaching months or few years of her life.   Patient Instructions  Medication Instructions:  No changes *If you need a refill on your cardiac medications before your next appointment, please call your pharmacy*  Follow-Up: At [facility], you and your health needs are our priority.  As part of our continuing mission to provide you with exceptional heart care, we have created designated Provider Care Teams.  These Care Teams include your primary Cardiologist (physician) and Advanced Practice Providers (APPs -  Physician Assistants and Nurse Practitioners) who all work together to provide you with the care you need, when you need it.  We recommend signing up for the patient portal called "MyChart".  Sign up information is provided on this After Visit Summary.  MyChart is used to connect with patients for Virtual Visits (Telemedicine).  Patients are able to view lab/test results, encounter notes, upcoming appointments, etc.  Non-urgent messages can be sent to your provider as well.   To learn more about what you can do with MyChart, go to [email or web address].    Your next appointment:   6 month(s)-  Phys Pacer Check   Provider:   Thurmon Fair, MD      Signed, Thurmon Fair, MD  02/07/2023 9:22 AM     [facility]

## 2023-02-02 NOTE — Patient Instructions (Signed)
Medication Instructions:  No changes *If you need a refill on your cardiac medications before your next appointment, please call your pharmacy*  Follow-Up: At [facility], you and your health needs are our priority.  As part of our continuing mission to provide you with exceptional heart care, we have created designated Provider Care Teams.  These Care Teams include your primary Cardiologist (physician) and Advanced Practice Providers (APPs -  Physician Assistants and Nurse Practitioners) who all work together to provide you with the care you need, when you need it.  We recommend signing up for the patient portal called "MyChart".  Sign up information is provided on this After Visit Summary.  MyChart is used to connect with patients for Virtual Visits (Telemedicine).  Patients are able to view lab/test results, encounter notes, upcoming appointments, etc.  Non-urgent messages can be sent to your provider as well.   To learn more about what you can do with MyChart, go to [email or web address].    Your next appointment:   6 month(s)- Phys Pacer Check  Provider:   Mihai Croitoru, MD     

## 2023-02-09 ENCOUNTER — Ambulatory Visit (INDEPENDENT_AMBULATORY_CARE_PROVIDER_SITE_OTHER): Payer: Medicare Other

## 2023-02-09 DIAGNOSIS — I495 Sick sinus syndrome: Secondary | ICD-10-CM | POA: Diagnosis not present

## 2023-02-09 LAB — CUP PACEART REMOTE DEVICE CHECK
Battery Remaining Longevity: 105 mo
Battery Voltage: 2.99 V
Brady Statistic AP VP Percent: 0.34 %
Brady Statistic AP VS Percent: 94.35 %
Brady Statistic AS VP Percent: 0.01 %
Brady Statistic AS VS Percent: 5.3 %
Brady Statistic RA Percent Paced: 94.79 %
Brady Statistic RV Percent Paced: 0.36 %
Date Time Interrogation Session: 20240411064116
Implantable Lead Connection Status: 753985
Implantable Lead Connection Status: 753985
Implantable Lead Implant Date: 20100309
Implantable Lead Implant Date: 20100309
Implantable Lead Location: 753859
Implantable Lead Location: 753860
Implantable Lead Model: 5076
Implantable Lead Model: 5076
Implantable Pulse Generator Implant Date: 20190327
Lead Channel Impedance Value: 323 Ohm
Lead Channel Impedance Value: 361 Ohm
Lead Channel Impedance Value: 589 Ohm
Lead Channel Impedance Value: 627 Ohm
Lead Channel Pacing Threshold Amplitude: 0.625 V
Lead Channel Pacing Threshold Amplitude: 0.75 V
Lead Channel Pacing Threshold Pulse Width: 0.4 ms
Lead Channel Pacing Threshold Pulse Width: 0.4 ms
Lead Channel Sensing Intrinsic Amplitude: 13.25 mV
Lead Channel Sensing Intrinsic Amplitude: 13.25 mV
Lead Channel Sensing Intrinsic Amplitude: 2.625 mV
Lead Channel Sensing Intrinsic Amplitude: 2.625 mV
Lead Channel Setting Pacing Amplitude: 2 V
Lead Channel Setting Pacing Amplitude: 2.5 V
Lead Channel Setting Pacing Pulse Width: 0.4 ms
Lead Channel Setting Sensing Sensitivity: 1.2 mV
Zone Setting Status: 755011
Zone Setting Status: 755011

## 2023-02-14 DIAGNOSIS — N390 Urinary tract infection, site not specified: Secondary | ICD-10-CM | POA: Diagnosis not present

## 2023-02-17 DIAGNOSIS — I2510 Atherosclerotic heart disease of native coronary artery without angina pectoris: Secondary | ICD-10-CM | POA: Diagnosis not present

## 2023-02-17 DIAGNOSIS — Z950 Presence of cardiac pacemaker: Secondary | ICD-10-CM | POA: Diagnosis not present

## 2023-02-17 DIAGNOSIS — I350 Nonrheumatic aortic (valve) stenosis: Secondary | ICD-10-CM | POA: Diagnosis not present

## 2023-02-17 DIAGNOSIS — N1832 Chronic kidney disease, stage 3b: Secondary | ICD-10-CM | POA: Diagnosis not present

## 2023-02-17 DIAGNOSIS — K219 Gastro-esophageal reflux disease without esophagitis: Secondary | ICD-10-CM | POA: Diagnosis not present

## 2023-02-17 DIAGNOSIS — I480 Paroxysmal atrial fibrillation: Secondary | ICD-10-CM | POA: Diagnosis not present

## 2023-02-17 DIAGNOSIS — I10 Essential (primary) hypertension: Secondary | ICD-10-CM | POA: Diagnosis not present

## 2023-02-17 DIAGNOSIS — F411 Generalized anxiety disorder: Secondary | ICD-10-CM | POA: Diagnosis not present

## 2023-02-17 DIAGNOSIS — I200 Unstable angina: Secondary | ICD-10-CM | POA: Diagnosis not present

## 2023-02-17 DIAGNOSIS — I509 Heart failure, unspecified: Secondary | ICD-10-CM | POA: Diagnosis not present

## 2023-02-17 DIAGNOSIS — I5032 Chronic diastolic (congestive) heart failure: Secondary | ICD-10-CM | POA: Diagnosis not present

## 2023-02-17 DIAGNOSIS — E039 Hypothyroidism, unspecified: Secondary | ICD-10-CM | POA: Diagnosis not present

## 2023-02-17 DIAGNOSIS — R0609 Other forms of dyspnea: Secondary | ICD-10-CM | POA: Diagnosis not present

## 2023-02-20 DIAGNOSIS — I509 Heart failure, unspecified: Secondary | ICD-10-CM | POA: Diagnosis not present

## 2023-02-20 DIAGNOSIS — I2510 Atherosclerotic heart disease of native coronary artery without angina pectoris: Secondary | ICD-10-CM | POA: Diagnosis not present

## 2023-02-20 DIAGNOSIS — R0609 Other forms of dyspnea: Secondary | ICD-10-CM | POA: Diagnosis not present

## 2023-02-20 DIAGNOSIS — I350 Nonrheumatic aortic (valve) stenosis: Secondary | ICD-10-CM | POA: Diagnosis not present

## 2023-02-20 DIAGNOSIS — N1832 Chronic kidney disease, stage 3b: Secondary | ICD-10-CM | POA: Diagnosis not present

## 2023-02-20 DIAGNOSIS — I10 Essential (primary) hypertension: Secondary | ICD-10-CM | POA: Diagnosis not present

## 2023-02-27 DIAGNOSIS — R0609 Other forms of dyspnea: Secondary | ICD-10-CM | POA: Diagnosis not present

## 2023-02-27 DIAGNOSIS — I2510 Atherosclerotic heart disease of native coronary artery without angina pectoris: Secondary | ICD-10-CM | POA: Diagnosis not present

## 2023-02-27 DIAGNOSIS — I350 Nonrheumatic aortic (valve) stenosis: Secondary | ICD-10-CM | POA: Diagnosis not present

## 2023-02-27 DIAGNOSIS — I10 Essential (primary) hypertension: Secondary | ICD-10-CM | POA: Diagnosis not present

## 2023-02-27 DIAGNOSIS — I509 Heart failure, unspecified: Secondary | ICD-10-CM | POA: Diagnosis not present

## 2023-02-27 DIAGNOSIS — N1832 Chronic kidney disease, stage 3b: Secondary | ICD-10-CM | POA: Diagnosis not present

## 2023-03-01 DIAGNOSIS — E039 Hypothyroidism, unspecified: Secondary | ICD-10-CM | POA: Diagnosis not present

## 2023-03-01 DIAGNOSIS — I10 Essential (primary) hypertension: Secondary | ICD-10-CM | POA: Diagnosis not present

## 2023-03-01 DIAGNOSIS — I509 Heart failure, unspecified: Secondary | ICD-10-CM | POA: Diagnosis not present

## 2023-03-01 DIAGNOSIS — I200 Unstable angina: Secondary | ICD-10-CM | POA: Diagnosis not present

## 2023-03-01 DIAGNOSIS — F411 Generalized anxiety disorder: Secondary | ICD-10-CM | POA: Diagnosis not present

## 2023-03-01 DIAGNOSIS — N1832 Chronic kidney disease, stage 3b: Secondary | ICD-10-CM | POA: Diagnosis not present

## 2023-03-01 DIAGNOSIS — K219 Gastro-esophageal reflux disease without esophagitis: Secondary | ICD-10-CM | POA: Diagnosis not present

## 2023-03-01 DIAGNOSIS — R0609 Other forms of dyspnea: Secondary | ICD-10-CM | POA: Diagnosis not present

## 2023-03-01 DIAGNOSIS — I5032 Chronic diastolic (congestive) heart failure: Secondary | ICD-10-CM | POA: Diagnosis not present

## 2023-03-01 DIAGNOSIS — I350 Nonrheumatic aortic (valve) stenosis: Secondary | ICD-10-CM | POA: Diagnosis not present

## 2023-03-01 DIAGNOSIS — I480 Paroxysmal atrial fibrillation: Secondary | ICD-10-CM | POA: Diagnosis not present

## 2023-03-01 DIAGNOSIS — Z950 Presence of cardiac pacemaker: Secondary | ICD-10-CM | POA: Diagnosis not present

## 2023-03-01 DIAGNOSIS — I2510 Atherosclerotic heart disease of native coronary artery without angina pectoris: Secondary | ICD-10-CM | POA: Diagnosis not present

## 2023-03-02 DIAGNOSIS — I10 Essential (primary) hypertension: Secondary | ICD-10-CM | POA: Diagnosis not present

## 2023-03-02 DIAGNOSIS — R0609 Other forms of dyspnea: Secondary | ICD-10-CM | POA: Diagnosis not present

## 2023-03-02 DIAGNOSIS — I2510 Atherosclerotic heart disease of native coronary artery without angina pectoris: Secondary | ICD-10-CM | POA: Diagnosis not present

## 2023-03-02 DIAGNOSIS — N1832 Chronic kidney disease, stage 3b: Secondary | ICD-10-CM | POA: Diagnosis not present

## 2023-03-02 DIAGNOSIS — I509 Heart failure, unspecified: Secondary | ICD-10-CM | POA: Diagnosis not present

## 2023-03-02 DIAGNOSIS — I350 Nonrheumatic aortic (valve) stenosis: Secondary | ICD-10-CM | POA: Diagnosis not present

## 2023-03-03 DIAGNOSIS — I2510 Atherosclerotic heart disease of native coronary artery without angina pectoris: Secondary | ICD-10-CM | POA: Diagnosis not present

## 2023-03-03 DIAGNOSIS — I350 Nonrheumatic aortic (valve) stenosis: Secondary | ICD-10-CM | POA: Diagnosis not present

## 2023-03-03 DIAGNOSIS — R0609 Other forms of dyspnea: Secondary | ICD-10-CM | POA: Diagnosis not present

## 2023-03-03 DIAGNOSIS — I10 Essential (primary) hypertension: Secondary | ICD-10-CM | POA: Diagnosis not present

## 2023-03-03 DIAGNOSIS — I509 Heart failure, unspecified: Secondary | ICD-10-CM | POA: Diagnosis not present

## 2023-03-03 DIAGNOSIS — N1832 Chronic kidney disease, stage 3b: Secondary | ICD-10-CM | POA: Diagnosis not present

## 2023-03-06 DIAGNOSIS — I2510 Atherosclerotic heart disease of native coronary artery without angina pectoris: Secondary | ICD-10-CM | POA: Diagnosis not present

## 2023-03-06 DIAGNOSIS — I10 Essential (primary) hypertension: Secondary | ICD-10-CM | POA: Diagnosis not present

## 2023-03-06 DIAGNOSIS — N1832 Chronic kidney disease, stage 3b: Secondary | ICD-10-CM | POA: Diagnosis not present

## 2023-03-06 DIAGNOSIS — I509 Heart failure, unspecified: Secondary | ICD-10-CM | POA: Diagnosis not present

## 2023-03-06 DIAGNOSIS — I350 Nonrheumatic aortic (valve) stenosis: Secondary | ICD-10-CM | POA: Diagnosis not present

## 2023-03-06 DIAGNOSIS — R0609 Other forms of dyspnea: Secondary | ICD-10-CM | POA: Diagnosis not present

## 2023-03-13 DIAGNOSIS — I350 Nonrheumatic aortic (valve) stenosis: Secondary | ICD-10-CM | POA: Diagnosis not present

## 2023-03-13 DIAGNOSIS — N1832 Chronic kidney disease, stage 3b: Secondary | ICD-10-CM | POA: Diagnosis not present

## 2023-03-13 DIAGNOSIS — I509 Heart failure, unspecified: Secondary | ICD-10-CM | POA: Diagnosis not present

## 2023-03-13 DIAGNOSIS — I2510 Atherosclerotic heart disease of native coronary artery without angina pectoris: Secondary | ICD-10-CM | POA: Diagnosis not present

## 2023-03-13 DIAGNOSIS — R0609 Other forms of dyspnea: Secondary | ICD-10-CM | POA: Diagnosis not present

## 2023-03-13 DIAGNOSIS — I10 Essential (primary) hypertension: Secondary | ICD-10-CM | POA: Diagnosis not present

## 2023-03-20 DIAGNOSIS — I350 Nonrheumatic aortic (valve) stenosis: Secondary | ICD-10-CM | POA: Diagnosis not present

## 2023-03-20 DIAGNOSIS — I2510 Atherosclerotic heart disease of native coronary artery without angina pectoris: Secondary | ICD-10-CM | POA: Diagnosis not present

## 2023-03-20 DIAGNOSIS — I10 Essential (primary) hypertension: Secondary | ICD-10-CM | POA: Diagnosis not present

## 2023-03-20 DIAGNOSIS — I509 Heart failure, unspecified: Secondary | ICD-10-CM | POA: Diagnosis not present

## 2023-03-20 DIAGNOSIS — N1832 Chronic kidney disease, stage 3b: Secondary | ICD-10-CM | POA: Diagnosis not present

## 2023-03-20 DIAGNOSIS — R0609 Other forms of dyspnea: Secondary | ICD-10-CM | POA: Diagnosis not present

## 2023-03-24 DIAGNOSIS — I10 Essential (primary) hypertension: Secondary | ICD-10-CM | POA: Diagnosis not present

## 2023-03-24 DIAGNOSIS — I350 Nonrheumatic aortic (valve) stenosis: Secondary | ICD-10-CM | POA: Diagnosis not present

## 2023-03-24 DIAGNOSIS — N1832 Chronic kidney disease, stage 3b: Secondary | ICD-10-CM | POA: Diagnosis not present

## 2023-03-24 DIAGNOSIS — R0609 Other forms of dyspnea: Secondary | ICD-10-CM | POA: Diagnosis not present

## 2023-03-24 DIAGNOSIS — I2510 Atherosclerotic heart disease of native coronary artery without angina pectoris: Secondary | ICD-10-CM | POA: Diagnosis not present

## 2023-03-24 DIAGNOSIS — I509 Heart failure, unspecified: Secondary | ICD-10-CM | POA: Diagnosis not present

## 2023-03-28 DIAGNOSIS — I350 Nonrheumatic aortic (valve) stenosis: Secondary | ICD-10-CM | POA: Diagnosis not present

## 2023-03-28 DIAGNOSIS — I509 Heart failure, unspecified: Secondary | ICD-10-CM | POA: Diagnosis not present

## 2023-03-28 DIAGNOSIS — I10 Essential (primary) hypertension: Secondary | ICD-10-CM | POA: Diagnosis not present

## 2023-03-28 DIAGNOSIS — N1832 Chronic kidney disease, stage 3b: Secondary | ICD-10-CM | POA: Diagnosis not present

## 2023-03-28 DIAGNOSIS — R0609 Other forms of dyspnea: Secondary | ICD-10-CM | POA: Diagnosis not present

## 2023-03-28 DIAGNOSIS — I2510 Atherosclerotic heart disease of native coronary artery without angina pectoris: Secondary | ICD-10-CM | POA: Diagnosis not present

## 2023-03-31 DIAGNOSIS — I509 Heart failure, unspecified: Secondary | ICD-10-CM | POA: Diagnosis not present

## 2023-03-31 DIAGNOSIS — I10 Essential (primary) hypertension: Secondary | ICD-10-CM | POA: Diagnosis not present

## 2023-03-31 DIAGNOSIS — R0609 Other forms of dyspnea: Secondary | ICD-10-CM | POA: Diagnosis not present

## 2023-03-31 DIAGNOSIS — I350 Nonrheumatic aortic (valve) stenosis: Secondary | ICD-10-CM | POA: Diagnosis not present

## 2023-03-31 DIAGNOSIS — I2510 Atherosclerotic heart disease of native coronary artery without angina pectoris: Secondary | ICD-10-CM | POA: Diagnosis not present

## 2023-03-31 DIAGNOSIS — N1832 Chronic kidney disease, stage 3b: Secondary | ICD-10-CM | POA: Diagnosis not present

## 2023-04-01 DIAGNOSIS — I509 Heart failure, unspecified: Secondary | ICD-10-CM | POA: Diagnosis not present

## 2023-04-01 DIAGNOSIS — K219 Gastro-esophageal reflux disease without esophagitis: Secondary | ICD-10-CM | POA: Diagnosis not present

## 2023-04-01 DIAGNOSIS — I5032 Chronic diastolic (congestive) heart failure: Secondary | ICD-10-CM | POA: Diagnosis not present

## 2023-04-01 DIAGNOSIS — I2510 Atherosclerotic heart disease of native coronary artery without angina pectoris: Secondary | ICD-10-CM | POA: Diagnosis not present

## 2023-04-01 DIAGNOSIS — Z950 Presence of cardiac pacemaker: Secondary | ICD-10-CM | POA: Diagnosis not present

## 2023-04-01 DIAGNOSIS — F411 Generalized anxiety disorder: Secondary | ICD-10-CM | POA: Diagnosis not present

## 2023-04-01 DIAGNOSIS — N1832 Chronic kidney disease, stage 3b: Secondary | ICD-10-CM | POA: Diagnosis not present

## 2023-04-01 DIAGNOSIS — I200 Unstable angina: Secondary | ICD-10-CM | POA: Diagnosis not present

## 2023-04-01 DIAGNOSIS — E039 Hypothyroidism, unspecified: Secondary | ICD-10-CM | POA: Diagnosis not present

## 2023-04-01 DIAGNOSIS — R0609 Other forms of dyspnea: Secondary | ICD-10-CM | POA: Diagnosis not present

## 2023-04-01 DIAGNOSIS — I10 Essential (primary) hypertension: Secondary | ICD-10-CM | POA: Diagnosis not present

## 2023-04-01 DIAGNOSIS — I350 Nonrheumatic aortic (valve) stenosis: Secondary | ICD-10-CM | POA: Diagnosis not present

## 2023-04-01 DIAGNOSIS — I480 Paroxysmal atrial fibrillation: Secondary | ICD-10-CM | POA: Diagnosis not present

## 2023-04-04 DIAGNOSIS — I2510 Atherosclerotic heart disease of native coronary artery without angina pectoris: Secondary | ICD-10-CM | POA: Diagnosis not present

## 2023-04-04 DIAGNOSIS — I509 Heart failure, unspecified: Secondary | ICD-10-CM | POA: Diagnosis not present

## 2023-04-04 DIAGNOSIS — I350 Nonrheumatic aortic (valve) stenosis: Secondary | ICD-10-CM | POA: Diagnosis not present

## 2023-04-04 DIAGNOSIS — I10 Essential (primary) hypertension: Secondary | ICD-10-CM | POA: Diagnosis not present

## 2023-04-04 DIAGNOSIS — R0609 Other forms of dyspnea: Secondary | ICD-10-CM | POA: Diagnosis not present

## 2023-04-04 DIAGNOSIS — N1832 Chronic kidney disease, stage 3b: Secondary | ICD-10-CM | POA: Diagnosis not present

## 2023-04-07 DIAGNOSIS — I509 Heart failure, unspecified: Secondary | ICD-10-CM | POA: Diagnosis not present

## 2023-04-07 DIAGNOSIS — I10 Essential (primary) hypertension: Secondary | ICD-10-CM | POA: Diagnosis not present

## 2023-04-07 DIAGNOSIS — R0609 Other forms of dyspnea: Secondary | ICD-10-CM | POA: Diagnosis not present

## 2023-04-07 DIAGNOSIS — N1832 Chronic kidney disease, stage 3b: Secondary | ICD-10-CM | POA: Diagnosis not present

## 2023-04-07 DIAGNOSIS — I350 Nonrheumatic aortic (valve) stenosis: Secondary | ICD-10-CM | POA: Diagnosis not present

## 2023-04-07 DIAGNOSIS — I2510 Atherosclerotic heart disease of native coronary artery without angina pectoris: Secondary | ICD-10-CM | POA: Diagnosis not present

## 2023-04-11 DIAGNOSIS — I509 Heart failure, unspecified: Secondary | ICD-10-CM | POA: Diagnosis not present

## 2023-04-11 DIAGNOSIS — I350 Nonrheumatic aortic (valve) stenosis: Secondary | ICD-10-CM | POA: Diagnosis not present

## 2023-04-11 DIAGNOSIS — I10 Essential (primary) hypertension: Secondary | ICD-10-CM | POA: Diagnosis not present

## 2023-04-11 DIAGNOSIS — N1832 Chronic kidney disease, stage 3b: Secondary | ICD-10-CM | POA: Diagnosis not present

## 2023-04-11 DIAGNOSIS — I2510 Atherosclerotic heart disease of native coronary artery without angina pectoris: Secondary | ICD-10-CM | POA: Diagnosis not present

## 2023-04-11 DIAGNOSIS — R0609 Other forms of dyspnea: Secondary | ICD-10-CM | POA: Diagnosis not present

## 2023-04-19 DIAGNOSIS — I10 Essential (primary) hypertension: Secondary | ICD-10-CM | POA: Diagnosis not present

## 2023-04-19 DIAGNOSIS — I509 Heart failure, unspecified: Secondary | ICD-10-CM | POA: Diagnosis not present

## 2023-04-19 DIAGNOSIS — I350 Nonrheumatic aortic (valve) stenosis: Secondary | ICD-10-CM | POA: Diagnosis not present

## 2023-04-19 DIAGNOSIS — N1832 Chronic kidney disease, stage 3b: Secondary | ICD-10-CM | POA: Diagnosis not present

## 2023-04-19 DIAGNOSIS — R0609 Other forms of dyspnea: Secondary | ICD-10-CM | POA: Diagnosis not present

## 2023-04-19 DIAGNOSIS — I2510 Atherosclerotic heart disease of native coronary artery without angina pectoris: Secondary | ICD-10-CM | POA: Diagnosis not present

## 2023-04-25 DIAGNOSIS — N1832 Chronic kidney disease, stage 3b: Secondary | ICD-10-CM | POA: Diagnosis not present

## 2023-04-25 DIAGNOSIS — R0609 Other forms of dyspnea: Secondary | ICD-10-CM | POA: Diagnosis not present

## 2023-04-25 DIAGNOSIS — I509 Heart failure, unspecified: Secondary | ICD-10-CM | POA: Diagnosis not present

## 2023-04-25 DIAGNOSIS — I350 Nonrheumatic aortic (valve) stenosis: Secondary | ICD-10-CM | POA: Diagnosis not present

## 2023-04-25 DIAGNOSIS — I2510 Atherosclerotic heart disease of native coronary artery without angina pectoris: Secondary | ICD-10-CM | POA: Diagnosis not present

## 2023-04-25 DIAGNOSIS — I10 Essential (primary) hypertension: Secondary | ICD-10-CM | POA: Diagnosis not present

## 2023-04-28 DIAGNOSIS — I2510 Atherosclerotic heart disease of native coronary artery without angina pectoris: Secondary | ICD-10-CM | POA: Diagnosis not present

## 2023-04-28 DIAGNOSIS — R0609 Other forms of dyspnea: Secondary | ICD-10-CM | POA: Diagnosis not present

## 2023-04-28 DIAGNOSIS — I10 Essential (primary) hypertension: Secondary | ICD-10-CM | POA: Diagnosis not present

## 2023-04-28 DIAGNOSIS — N1832 Chronic kidney disease, stage 3b: Secondary | ICD-10-CM | POA: Diagnosis not present

## 2023-04-28 DIAGNOSIS — I509 Heart failure, unspecified: Secondary | ICD-10-CM | POA: Diagnosis not present

## 2023-04-28 DIAGNOSIS — I350 Nonrheumatic aortic (valve) stenosis: Secondary | ICD-10-CM | POA: Diagnosis not present

## 2023-05-01 DIAGNOSIS — I509 Heart failure, unspecified: Secondary | ICD-10-CM | POA: Diagnosis not present

## 2023-05-01 DIAGNOSIS — I5032 Chronic diastolic (congestive) heart failure: Secondary | ICD-10-CM | POA: Diagnosis not present

## 2023-05-01 DIAGNOSIS — Z950 Presence of cardiac pacemaker: Secondary | ICD-10-CM | POA: Diagnosis not present

## 2023-05-01 DIAGNOSIS — E039 Hypothyroidism, unspecified: Secondary | ICD-10-CM | POA: Diagnosis not present

## 2023-05-01 DIAGNOSIS — I10 Essential (primary) hypertension: Secondary | ICD-10-CM | POA: Diagnosis not present

## 2023-05-01 DIAGNOSIS — R0609 Other forms of dyspnea: Secondary | ICD-10-CM | POA: Diagnosis not present

## 2023-05-01 DIAGNOSIS — K219 Gastro-esophageal reflux disease without esophagitis: Secondary | ICD-10-CM | POA: Diagnosis not present

## 2023-05-01 DIAGNOSIS — F411 Generalized anxiety disorder: Secondary | ICD-10-CM | POA: Diagnosis not present

## 2023-05-01 DIAGNOSIS — I480 Paroxysmal atrial fibrillation: Secondary | ICD-10-CM | POA: Diagnosis not present

## 2023-05-01 DIAGNOSIS — I200 Unstable angina: Secondary | ICD-10-CM | POA: Diagnosis not present

## 2023-05-01 DIAGNOSIS — I2510 Atherosclerotic heart disease of native coronary artery without angina pectoris: Secondary | ICD-10-CM | POA: Diagnosis not present

## 2023-05-01 DIAGNOSIS — N1832 Chronic kidney disease, stage 3b: Secondary | ICD-10-CM | POA: Diagnosis not present

## 2023-05-01 DIAGNOSIS — I350 Nonrheumatic aortic (valve) stenosis: Secondary | ICD-10-CM | POA: Diagnosis not present

## 2023-05-03 DIAGNOSIS — I350 Nonrheumatic aortic (valve) stenosis: Secondary | ICD-10-CM | POA: Diagnosis not present

## 2023-05-03 DIAGNOSIS — I10 Essential (primary) hypertension: Secondary | ICD-10-CM | POA: Diagnosis not present

## 2023-05-03 DIAGNOSIS — I2510 Atherosclerotic heart disease of native coronary artery without angina pectoris: Secondary | ICD-10-CM | POA: Diagnosis not present

## 2023-05-03 DIAGNOSIS — R0609 Other forms of dyspnea: Secondary | ICD-10-CM | POA: Diagnosis not present

## 2023-05-03 DIAGNOSIS — I509 Heart failure, unspecified: Secondary | ICD-10-CM | POA: Diagnosis not present

## 2023-05-03 DIAGNOSIS — N1832 Chronic kidney disease, stage 3b: Secondary | ICD-10-CM | POA: Diagnosis not present

## 2023-05-09 DIAGNOSIS — I2510 Atherosclerotic heart disease of native coronary artery without angina pectoris: Secondary | ICD-10-CM | POA: Diagnosis not present

## 2023-05-09 DIAGNOSIS — I350 Nonrheumatic aortic (valve) stenosis: Secondary | ICD-10-CM | POA: Diagnosis not present

## 2023-05-09 DIAGNOSIS — I10 Essential (primary) hypertension: Secondary | ICD-10-CM | POA: Diagnosis not present

## 2023-05-09 DIAGNOSIS — I509 Heart failure, unspecified: Secondary | ICD-10-CM | POA: Diagnosis not present

## 2023-05-09 DIAGNOSIS — N1832 Chronic kidney disease, stage 3b: Secondary | ICD-10-CM | POA: Diagnosis not present

## 2023-05-09 DIAGNOSIS — R0609 Other forms of dyspnea: Secondary | ICD-10-CM | POA: Diagnosis not present

## 2023-05-11 ENCOUNTER — Ambulatory Visit (INDEPENDENT_AMBULATORY_CARE_PROVIDER_SITE_OTHER): Payer: Medicare Other

## 2023-05-11 DIAGNOSIS — I2510 Atherosclerotic heart disease of native coronary artery without angina pectoris: Secondary | ICD-10-CM | POA: Diagnosis not present

## 2023-05-11 DIAGNOSIS — I495 Sick sinus syndrome: Secondary | ICD-10-CM

## 2023-05-11 DIAGNOSIS — N1832 Chronic kidney disease, stage 3b: Secondary | ICD-10-CM | POA: Diagnosis not present

## 2023-05-11 DIAGNOSIS — I350 Nonrheumatic aortic (valve) stenosis: Secondary | ICD-10-CM | POA: Diagnosis not present

## 2023-05-11 DIAGNOSIS — I509 Heart failure, unspecified: Secondary | ICD-10-CM | POA: Diagnosis not present

## 2023-05-11 DIAGNOSIS — I10 Essential (primary) hypertension: Secondary | ICD-10-CM | POA: Diagnosis not present

## 2023-05-11 DIAGNOSIS — R0609 Other forms of dyspnea: Secondary | ICD-10-CM | POA: Diagnosis not present

## 2023-05-11 LAB — CUP PACEART REMOTE DEVICE CHECK
Battery Remaining Longevity: 102 mo
Battery Voltage: 3 V
Brady Statistic AP VP Percent: 0.41 %
Brady Statistic AP VS Percent: 98.32 %
Brady Statistic AS VP Percent: 0.02 %
Brady Statistic AS VS Percent: 1.24 %
Brady Statistic RA Percent Paced: 98.65 %
Brady Statistic RV Percent Paced: 0.51 %
Date Time Interrogation Session: 20240710212047
Implantable Lead Connection Status: 753985
Implantable Lead Connection Status: 753985
Implantable Lead Implant Date: 20100309
Implantable Lead Implant Date: 20100309
Implantable Lead Location: 753859
Implantable Lead Location: 753860
Implantable Lead Model: 5076
Implantable Lead Model: 5076
Implantable Pulse Generator Implant Date: 20190327
Lead Channel Impedance Value: 323 Ohm
Lead Channel Impedance Value: 361 Ohm
Lead Channel Impedance Value: 494 Ohm
Lead Channel Impedance Value: 532 Ohm
Lead Channel Pacing Threshold Amplitude: 0.625 V
Lead Channel Pacing Threshold Amplitude: 1 V
Lead Channel Pacing Threshold Pulse Width: 0.4 ms
Lead Channel Pacing Threshold Pulse Width: 0.4 ms
Lead Channel Sensing Intrinsic Amplitude: 1.875 mV
Lead Channel Sensing Intrinsic Amplitude: 1.875 mV
Lead Channel Sensing Intrinsic Amplitude: 6.875 mV
Lead Channel Sensing Intrinsic Amplitude: 6.875 mV
Lead Channel Setting Pacing Amplitude: 2 V
Lead Channel Setting Pacing Amplitude: 2.5 V
Lead Channel Setting Pacing Pulse Width: 0.4 ms
Lead Channel Setting Sensing Sensitivity: 1.2 mV
Zone Setting Status: 755011
Zone Setting Status: 755011

## 2023-05-15 DIAGNOSIS — I2510 Atherosclerotic heart disease of native coronary artery without angina pectoris: Secondary | ICD-10-CM | POA: Diagnosis not present

## 2023-05-15 DIAGNOSIS — N1832 Chronic kidney disease, stage 3b: Secondary | ICD-10-CM | POA: Diagnosis not present

## 2023-05-15 DIAGNOSIS — R0609 Other forms of dyspnea: Secondary | ICD-10-CM | POA: Diagnosis not present

## 2023-05-15 DIAGNOSIS — I509 Heart failure, unspecified: Secondary | ICD-10-CM | POA: Diagnosis not present

## 2023-05-15 DIAGNOSIS — I10 Essential (primary) hypertension: Secondary | ICD-10-CM | POA: Diagnosis not present

## 2023-05-15 DIAGNOSIS — I350 Nonrheumatic aortic (valve) stenosis: Secondary | ICD-10-CM | POA: Diagnosis not present

## 2023-05-17 DIAGNOSIS — I350 Nonrheumatic aortic (valve) stenosis: Secondary | ICD-10-CM | POA: Diagnosis not present

## 2023-05-17 DIAGNOSIS — N1832 Chronic kidney disease, stage 3b: Secondary | ICD-10-CM | POA: Diagnosis not present

## 2023-05-17 DIAGNOSIS — I10 Essential (primary) hypertension: Secondary | ICD-10-CM | POA: Diagnosis not present

## 2023-05-17 DIAGNOSIS — I509 Heart failure, unspecified: Secondary | ICD-10-CM | POA: Diagnosis not present

## 2023-05-17 DIAGNOSIS — I2510 Atherosclerotic heart disease of native coronary artery without angina pectoris: Secondary | ICD-10-CM | POA: Diagnosis not present

## 2023-05-17 DIAGNOSIS — R0609 Other forms of dyspnea: Secondary | ICD-10-CM | POA: Diagnosis not present

## 2023-05-19 DIAGNOSIS — R0609 Other forms of dyspnea: Secondary | ICD-10-CM | POA: Diagnosis not present

## 2023-05-19 DIAGNOSIS — I350 Nonrheumatic aortic (valve) stenosis: Secondary | ICD-10-CM | POA: Diagnosis not present

## 2023-05-19 DIAGNOSIS — I2510 Atherosclerotic heart disease of native coronary artery without angina pectoris: Secondary | ICD-10-CM | POA: Diagnosis not present

## 2023-05-19 DIAGNOSIS — I10 Essential (primary) hypertension: Secondary | ICD-10-CM | POA: Diagnosis not present

## 2023-05-19 DIAGNOSIS — N1832 Chronic kidney disease, stage 3b: Secondary | ICD-10-CM | POA: Diagnosis not present

## 2023-05-19 DIAGNOSIS — I509 Heart failure, unspecified: Secondary | ICD-10-CM | POA: Diagnosis not present

## 2023-05-22 DIAGNOSIS — R0609 Other forms of dyspnea: Secondary | ICD-10-CM | POA: Diagnosis not present

## 2023-05-22 DIAGNOSIS — I10 Essential (primary) hypertension: Secondary | ICD-10-CM | POA: Diagnosis not present

## 2023-05-22 DIAGNOSIS — I509 Heart failure, unspecified: Secondary | ICD-10-CM | POA: Diagnosis not present

## 2023-05-22 DIAGNOSIS — I2510 Atherosclerotic heart disease of native coronary artery without angina pectoris: Secondary | ICD-10-CM | POA: Diagnosis not present

## 2023-05-22 DIAGNOSIS — N1832 Chronic kidney disease, stage 3b: Secondary | ICD-10-CM | POA: Diagnosis not present

## 2023-05-22 DIAGNOSIS — I350 Nonrheumatic aortic (valve) stenosis: Secondary | ICD-10-CM | POA: Diagnosis not present

## 2023-05-23 DIAGNOSIS — N1832 Chronic kidney disease, stage 3b: Secondary | ICD-10-CM | POA: Diagnosis not present

## 2023-05-23 DIAGNOSIS — I350 Nonrheumatic aortic (valve) stenosis: Secondary | ICD-10-CM | POA: Diagnosis not present

## 2023-05-23 DIAGNOSIS — R0609 Other forms of dyspnea: Secondary | ICD-10-CM | POA: Diagnosis not present

## 2023-05-23 DIAGNOSIS — I2510 Atherosclerotic heart disease of native coronary artery without angina pectoris: Secondary | ICD-10-CM | POA: Diagnosis not present

## 2023-05-23 DIAGNOSIS — I10 Essential (primary) hypertension: Secondary | ICD-10-CM | POA: Diagnosis not present

## 2023-05-23 DIAGNOSIS — I509 Heart failure, unspecified: Secondary | ICD-10-CM | POA: Diagnosis not present

## 2023-05-24 DIAGNOSIS — R0609 Other forms of dyspnea: Secondary | ICD-10-CM | POA: Diagnosis not present

## 2023-05-24 DIAGNOSIS — I509 Heart failure, unspecified: Secondary | ICD-10-CM | POA: Diagnosis not present

## 2023-05-24 DIAGNOSIS — I10 Essential (primary) hypertension: Secondary | ICD-10-CM | POA: Diagnosis not present

## 2023-05-24 DIAGNOSIS — I350 Nonrheumatic aortic (valve) stenosis: Secondary | ICD-10-CM | POA: Diagnosis not present

## 2023-05-24 DIAGNOSIS — I2510 Atherosclerotic heart disease of native coronary artery without angina pectoris: Secondary | ICD-10-CM | POA: Diagnosis not present

## 2023-05-24 DIAGNOSIS — N1832 Chronic kidney disease, stage 3b: Secondary | ICD-10-CM | POA: Diagnosis not present

## 2023-05-29 DIAGNOSIS — I2510 Atherosclerotic heart disease of native coronary artery without angina pectoris: Secondary | ICD-10-CM | POA: Diagnosis not present

## 2023-05-29 DIAGNOSIS — I509 Heart failure, unspecified: Secondary | ICD-10-CM | POA: Diagnosis not present

## 2023-05-29 DIAGNOSIS — N1832 Chronic kidney disease, stage 3b: Secondary | ICD-10-CM | POA: Diagnosis not present

## 2023-05-29 DIAGNOSIS — I10 Essential (primary) hypertension: Secondary | ICD-10-CM | POA: Diagnosis not present

## 2023-05-29 DIAGNOSIS — I350 Nonrheumatic aortic (valve) stenosis: Secondary | ICD-10-CM | POA: Diagnosis not present

## 2023-05-29 DIAGNOSIS — R0609 Other forms of dyspnea: Secondary | ICD-10-CM | POA: Diagnosis not present

## 2023-05-30 NOTE — Progress Notes (Signed)
Remote pacemaker transmission.   

## 2023-06-01 DIAGNOSIS — I509 Heart failure, unspecified: Secondary | ICD-10-CM | POA: Diagnosis not present

## 2023-06-01 DIAGNOSIS — Z950 Presence of cardiac pacemaker: Secondary | ICD-10-CM | POA: Diagnosis not present

## 2023-06-01 DIAGNOSIS — I5032 Chronic diastolic (congestive) heart failure: Secondary | ICD-10-CM | POA: Diagnosis not present

## 2023-06-01 DIAGNOSIS — N1832 Chronic kidney disease, stage 3b: Secondary | ICD-10-CM | POA: Diagnosis not present

## 2023-06-01 DIAGNOSIS — I350 Nonrheumatic aortic (valve) stenosis: Secondary | ICD-10-CM | POA: Diagnosis not present

## 2023-06-01 DIAGNOSIS — E039 Hypothyroidism, unspecified: Secondary | ICD-10-CM | POA: Diagnosis not present

## 2023-06-01 DIAGNOSIS — I2510 Atherosclerotic heart disease of native coronary artery without angina pectoris: Secondary | ICD-10-CM | POA: Diagnosis not present

## 2023-06-01 DIAGNOSIS — I10 Essential (primary) hypertension: Secondary | ICD-10-CM | POA: Diagnosis not present

## 2023-06-01 DIAGNOSIS — I480 Paroxysmal atrial fibrillation: Secondary | ICD-10-CM | POA: Diagnosis not present

## 2023-06-01 DIAGNOSIS — R0609 Other forms of dyspnea: Secondary | ICD-10-CM | POA: Diagnosis not present

## 2023-06-01 DIAGNOSIS — I200 Unstable angina: Secondary | ICD-10-CM | POA: Diagnosis not present

## 2023-06-01 DIAGNOSIS — K219 Gastro-esophageal reflux disease without esophagitis: Secondary | ICD-10-CM | POA: Diagnosis not present

## 2023-06-01 DIAGNOSIS — F411 Generalized anxiety disorder: Secondary | ICD-10-CM | POA: Diagnosis not present

## 2023-06-02 DIAGNOSIS — R0609 Other forms of dyspnea: Secondary | ICD-10-CM | POA: Diagnosis not present

## 2023-06-02 DIAGNOSIS — N1832 Chronic kidney disease, stage 3b: Secondary | ICD-10-CM | POA: Diagnosis not present

## 2023-06-02 DIAGNOSIS — I2510 Atherosclerotic heart disease of native coronary artery without angina pectoris: Secondary | ICD-10-CM | POA: Diagnosis not present

## 2023-06-02 DIAGNOSIS — I10 Essential (primary) hypertension: Secondary | ICD-10-CM | POA: Diagnosis not present

## 2023-06-02 DIAGNOSIS — I509 Heart failure, unspecified: Secondary | ICD-10-CM | POA: Diagnosis not present

## 2023-06-02 DIAGNOSIS — I350 Nonrheumatic aortic (valve) stenosis: Secondary | ICD-10-CM | POA: Diagnosis not present

## 2023-06-09 DIAGNOSIS — I350 Nonrheumatic aortic (valve) stenosis: Secondary | ICD-10-CM | POA: Diagnosis not present

## 2023-06-09 DIAGNOSIS — I509 Heart failure, unspecified: Secondary | ICD-10-CM | POA: Diagnosis not present

## 2023-06-09 DIAGNOSIS — N1832 Chronic kidney disease, stage 3b: Secondary | ICD-10-CM | POA: Diagnosis not present

## 2023-06-09 DIAGNOSIS — R0609 Other forms of dyspnea: Secondary | ICD-10-CM | POA: Diagnosis not present

## 2023-06-09 DIAGNOSIS — I2510 Atherosclerotic heart disease of native coronary artery without angina pectoris: Secondary | ICD-10-CM | POA: Diagnosis not present

## 2023-06-09 DIAGNOSIS — I10 Essential (primary) hypertension: Secondary | ICD-10-CM | POA: Diagnosis not present

## 2023-06-12 DIAGNOSIS — I509 Heart failure, unspecified: Secondary | ICD-10-CM | POA: Diagnosis not present

## 2023-06-12 DIAGNOSIS — I350 Nonrheumatic aortic (valve) stenosis: Secondary | ICD-10-CM | POA: Diagnosis not present

## 2023-06-12 DIAGNOSIS — I2510 Atherosclerotic heart disease of native coronary artery without angina pectoris: Secondary | ICD-10-CM | POA: Diagnosis not present

## 2023-06-12 DIAGNOSIS — I10 Essential (primary) hypertension: Secondary | ICD-10-CM | POA: Diagnosis not present

## 2023-06-12 DIAGNOSIS — R0609 Other forms of dyspnea: Secondary | ICD-10-CM | POA: Diagnosis not present

## 2023-06-12 DIAGNOSIS — N1832 Chronic kidney disease, stage 3b: Secondary | ICD-10-CM | POA: Diagnosis not present

## 2023-06-13 DIAGNOSIS — I350 Nonrheumatic aortic (valve) stenosis: Secondary | ICD-10-CM | POA: Diagnosis not present

## 2023-06-13 DIAGNOSIS — I2510 Atherosclerotic heart disease of native coronary artery without angina pectoris: Secondary | ICD-10-CM | POA: Diagnosis not present

## 2023-06-13 DIAGNOSIS — I509 Heart failure, unspecified: Secondary | ICD-10-CM | POA: Diagnosis not present

## 2023-06-13 DIAGNOSIS — R0609 Other forms of dyspnea: Secondary | ICD-10-CM | POA: Diagnosis not present

## 2023-06-13 DIAGNOSIS — I10 Essential (primary) hypertension: Secondary | ICD-10-CM | POA: Diagnosis not present

## 2023-06-13 DIAGNOSIS — N1832 Chronic kidney disease, stage 3b: Secondary | ICD-10-CM | POA: Diagnosis not present

## 2023-06-14 DIAGNOSIS — I2510 Atherosclerotic heart disease of native coronary artery without angina pectoris: Secondary | ICD-10-CM | POA: Diagnosis not present

## 2023-06-14 DIAGNOSIS — I10 Essential (primary) hypertension: Secondary | ICD-10-CM | POA: Diagnosis not present

## 2023-06-14 DIAGNOSIS — I509 Heart failure, unspecified: Secondary | ICD-10-CM | POA: Diagnosis not present

## 2023-06-14 DIAGNOSIS — I350 Nonrheumatic aortic (valve) stenosis: Secondary | ICD-10-CM | POA: Diagnosis not present

## 2023-06-14 DIAGNOSIS — N1832 Chronic kidney disease, stage 3b: Secondary | ICD-10-CM | POA: Diagnosis not present

## 2023-06-14 DIAGNOSIS — R0609 Other forms of dyspnea: Secondary | ICD-10-CM | POA: Diagnosis not present

## 2023-06-16 DIAGNOSIS — I350 Nonrheumatic aortic (valve) stenosis: Secondary | ICD-10-CM | POA: Diagnosis not present

## 2023-06-16 DIAGNOSIS — I10 Essential (primary) hypertension: Secondary | ICD-10-CM | POA: Diagnosis not present

## 2023-06-16 DIAGNOSIS — N1832 Chronic kidney disease, stage 3b: Secondary | ICD-10-CM | POA: Diagnosis not present

## 2023-06-16 DIAGNOSIS — I2510 Atherosclerotic heart disease of native coronary artery without angina pectoris: Secondary | ICD-10-CM | POA: Diagnosis not present

## 2023-06-16 DIAGNOSIS — I509 Heart failure, unspecified: Secondary | ICD-10-CM | POA: Diagnosis not present

## 2023-06-16 DIAGNOSIS — R0609 Other forms of dyspnea: Secondary | ICD-10-CM | POA: Diagnosis not present

## 2023-06-17 DIAGNOSIS — R0609 Other forms of dyspnea: Secondary | ICD-10-CM | POA: Diagnosis not present

## 2023-06-17 DIAGNOSIS — I10 Essential (primary) hypertension: Secondary | ICD-10-CM | POA: Diagnosis not present

## 2023-06-17 DIAGNOSIS — N1832 Chronic kidney disease, stage 3b: Secondary | ICD-10-CM | POA: Diagnosis not present

## 2023-06-17 DIAGNOSIS — I350 Nonrheumatic aortic (valve) stenosis: Secondary | ICD-10-CM | POA: Diagnosis not present

## 2023-06-17 DIAGNOSIS — I509 Heart failure, unspecified: Secondary | ICD-10-CM | POA: Diagnosis not present

## 2023-06-17 DIAGNOSIS — I2510 Atherosclerotic heart disease of native coronary artery without angina pectoris: Secondary | ICD-10-CM | POA: Diagnosis not present

## 2023-06-19 DIAGNOSIS — N1832 Chronic kidney disease, stage 3b: Secondary | ICD-10-CM | POA: Diagnosis not present

## 2023-06-19 DIAGNOSIS — I350 Nonrheumatic aortic (valve) stenosis: Secondary | ICD-10-CM | POA: Diagnosis not present

## 2023-06-19 DIAGNOSIS — R0609 Other forms of dyspnea: Secondary | ICD-10-CM | POA: Diagnosis not present

## 2023-06-19 DIAGNOSIS — I10 Essential (primary) hypertension: Secondary | ICD-10-CM | POA: Diagnosis not present

## 2023-06-19 DIAGNOSIS — I2510 Atherosclerotic heart disease of native coronary artery without angina pectoris: Secondary | ICD-10-CM | POA: Diagnosis not present

## 2023-06-19 DIAGNOSIS — I509 Heart failure, unspecified: Secondary | ICD-10-CM | POA: Diagnosis not present

## 2023-06-21 DIAGNOSIS — N1832 Chronic kidney disease, stage 3b: Secondary | ICD-10-CM | POA: Diagnosis not present

## 2023-06-21 DIAGNOSIS — I10 Essential (primary) hypertension: Secondary | ICD-10-CM | POA: Diagnosis not present

## 2023-06-21 DIAGNOSIS — I2510 Atherosclerotic heart disease of native coronary artery without angina pectoris: Secondary | ICD-10-CM | POA: Diagnosis not present

## 2023-06-21 DIAGNOSIS — I350 Nonrheumatic aortic (valve) stenosis: Secondary | ICD-10-CM | POA: Diagnosis not present

## 2023-06-21 DIAGNOSIS — I509 Heart failure, unspecified: Secondary | ICD-10-CM | POA: Diagnosis not present

## 2023-06-21 DIAGNOSIS — R0609 Other forms of dyspnea: Secondary | ICD-10-CM | POA: Diagnosis not present

## 2023-06-22 DIAGNOSIS — R0609 Other forms of dyspnea: Secondary | ICD-10-CM | POA: Diagnosis not present

## 2023-06-22 DIAGNOSIS — I509 Heart failure, unspecified: Secondary | ICD-10-CM | POA: Diagnosis not present

## 2023-06-22 DIAGNOSIS — N1832 Chronic kidney disease, stage 3b: Secondary | ICD-10-CM | POA: Diagnosis not present

## 2023-06-22 DIAGNOSIS — I350 Nonrheumatic aortic (valve) stenosis: Secondary | ICD-10-CM | POA: Diagnosis not present

## 2023-06-22 DIAGNOSIS — I10 Essential (primary) hypertension: Secondary | ICD-10-CM | POA: Diagnosis not present

## 2023-06-22 DIAGNOSIS — I2510 Atherosclerotic heart disease of native coronary artery without angina pectoris: Secondary | ICD-10-CM | POA: Diagnosis not present

## 2023-06-26 DIAGNOSIS — I350 Nonrheumatic aortic (valve) stenosis: Secondary | ICD-10-CM | POA: Diagnosis not present

## 2023-06-26 DIAGNOSIS — I509 Heart failure, unspecified: Secondary | ICD-10-CM | POA: Diagnosis not present

## 2023-06-26 DIAGNOSIS — I10 Essential (primary) hypertension: Secondary | ICD-10-CM | POA: Diagnosis not present

## 2023-06-26 DIAGNOSIS — R0609 Other forms of dyspnea: Secondary | ICD-10-CM | POA: Diagnosis not present

## 2023-06-26 DIAGNOSIS — N1832 Chronic kidney disease, stage 3b: Secondary | ICD-10-CM | POA: Diagnosis not present

## 2023-06-26 DIAGNOSIS — I2510 Atherosclerotic heart disease of native coronary artery without angina pectoris: Secondary | ICD-10-CM | POA: Diagnosis not present

## 2023-06-28 DIAGNOSIS — I10 Essential (primary) hypertension: Secondary | ICD-10-CM | POA: Diagnosis not present

## 2023-06-28 DIAGNOSIS — R0609 Other forms of dyspnea: Secondary | ICD-10-CM | POA: Diagnosis not present

## 2023-06-28 DIAGNOSIS — I509 Heart failure, unspecified: Secondary | ICD-10-CM | POA: Diagnosis not present

## 2023-06-28 DIAGNOSIS — N1832 Chronic kidney disease, stage 3b: Secondary | ICD-10-CM | POA: Diagnosis not present

## 2023-06-28 DIAGNOSIS — I350 Nonrheumatic aortic (valve) stenosis: Secondary | ICD-10-CM | POA: Diagnosis not present

## 2023-06-28 DIAGNOSIS — I2510 Atherosclerotic heart disease of native coronary artery without angina pectoris: Secondary | ICD-10-CM | POA: Diagnosis not present

## 2023-07-02 DIAGNOSIS — Z950 Presence of cardiac pacemaker: Secondary | ICD-10-CM | POA: Diagnosis not present

## 2023-07-02 DIAGNOSIS — I350 Nonrheumatic aortic (valve) stenosis: Secondary | ICD-10-CM | POA: Diagnosis not present

## 2023-07-02 DIAGNOSIS — I10 Essential (primary) hypertension: Secondary | ICD-10-CM | POA: Diagnosis not present

## 2023-07-02 DIAGNOSIS — I509 Heart failure, unspecified: Secondary | ICD-10-CM | POA: Diagnosis not present

## 2023-07-02 DIAGNOSIS — I200 Unstable angina: Secondary | ICD-10-CM | POA: Diagnosis not present

## 2023-07-02 DIAGNOSIS — E039 Hypothyroidism, unspecified: Secondary | ICD-10-CM | POA: Diagnosis not present

## 2023-07-02 DIAGNOSIS — F411 Generalized anxiety disorder: Secondary | ICD-10-CM | POA: Diagnosis not present

## 2023-07-02 DIAGNOSIS — R0609 Other forms of dyspnea: Secondary | ICD-10-CM | POA: Diagnosis not present

## 2023-07-02 DIAGNOSIS — I2510 Atherosclerotic heart disease of native coronary artery without angina pectoris: Secondary | ICD-10-CM | POA: Diagnosis not present

## 2023-07-02 DIAGNOSIS — I480 Paroxysmal atrial fibrillation: Secondary | ICD-10-CM | POA: Diagnosis not present

## 2023-07-02 DIAGNOSIS — I5032 Chronic diastolic (congestive) heart failure: Secondary | ICD-10-CM | POA: Diagnosis not present

## 2023-07-02 DIAGNOSIS — K219 Gastro-esophageal reflux disease without esophagitis: Secondary | ICD-10-CM | POA: Diagnosis not present

## 2023-07-02 DIAGNOSIS — N1832 Chronic kidney disease, stage 3b: Secondary | ICD-10-CM | POA: Diagnosis not present

## 2023-07-04 DIAGNOSIS — N1832 Chronic kidney disease, stage 3b: Secondary | ICD-10-CM | POA: Diagnosis not present

## 2023-07-04 DIAGNOSIS — I350 Nonrheumatic aortic (valve) stenosis: Secondary | ICD-10-CM | POA: Diagnosis not present

## 2023-07-04 DIAGNOSIS — I2510 Atherosclerotic heart disease of native coronary artery without angina pectoris: Secondary | ICD-10-CM | POA: Diagnosis not present

## 2023-07-04 DIAGNOSIS — I10 Essential (primary) hypertension: Secondary | ICD-10-CM | POA: Diagnosis not present

## 2023-07-04 DIAGNOSIS — R0609 Other forms of dyspnea: Secondary | ICD-10-CM | POA: Diagnosis not present

## 2023-07-04 DIAGNOSIS — I509 Heart failure, unspecified: Secondary | ICD-10-CM | POA: Diagnosis not present

## 2023-07-05 DIAGNOSIS — R0609 Other forms of dyspnea: Secondary | ICD-10-CM | POA: Diagnosis not present

## 2023-07-05 DIAGNOSIS — I350 Nonrheumatic aortic (valve) stenosis: Secondary | ICD-10-CM | POA: Diagnosis not present

## 2023-07-05 DIAGNOSIS — I2510 Atherosclerotic heart disease of native coronary artery without angina pectoris: Secondary | ICD-10-CM | POA: Diagnosis not present

## 2023-07-05 DIAGNOSIS — I10 Essential (primary) hypertension: Secondary | ICD-10-CM | POA: Diagnosis not present

## 2023-07-05 DIAGNOSIS — N1832 Chronic kidney disease, stage 3b: Secondary | ICD-10-CM | POA: Diagnosis not present

## 2023-07-05 DIAGNOSIS — I509 Heart failure, unspecified: Secondary | ICD-10-CM | POA: Diagnosis not present

## 2023-07-06 DIAGNOSIS — I2510 Atherosclerotic heart disease of native coronary artery without angina pectoris: Secondary | ICD-10-CM | POA: Diagnosis not present

## 2023-07-06 DIAGNOSIS — I509 Heart failure, unspecified: Secondary | ICD-10-CM | POA: Diagnosis not present

## 2023-07-06 DIAGNOSIS — I350 Nonrheumatic aortic (valve) stenosis: Secondary | ICD-10-CM | POA: Diagnosis not present

## 2023-07-06 DIAGNOSIS — I10 Essential (primary) hypertension: Secondary | ICD-10-CM | POA: Diagnosis not present

## 2023-07-06 DIAGNOSIS — N1832 Chronic kidney disease, stage 3b: Secondary | ICD-10-CM | POA: Diagnosis not present

## 2023-07-06 DIAGNOSIS — R0609 Other forms of dyspnea: Secondary | ICD-10-CM | POA: Diagnosis not present

## 2023-07-10 DIAGNOSIS — N1832 Chronic kidney disease, stage 3b: Secondary | ICD-10-CM | POA: Diagnosis not present

## 2023-07-10 DIAGNOSIS — R0609 Other forms of dyspnea: Secondary | ICD-10-CM | POA: Diagnosis not present

## 2023-07-10 DIAGNOSIS — I2510 Atherosclerotic heart disease of native coronary artery without angina pectoris: Secondary | ICD-10-CM | POA: Diagnosis not present

## 2023-07-10 DIAGNOSIS — I350 Nonrheumatic aortic (valve) stenosis: Secondary | ICD-10-CM | POA: Diagnosis not present

## 2023-07-10 DIAGNOSIS — I10 Essential (primary) hypertension: Secondary | ICD-10-CM | POA: Diagnosis not present

## 2023-07-10 DIAGNOSIS — I509 Heart failure, unspecified: Secondary | ICD-10-CM | POA: Diagnosis not present

## 2023-07-12 DIAGNOSIS — I2510 Atherosclerotic heart disease of native coronary artery without angina pectoris: Secondary | ICD-10-CM | POA: Diagnosis not present

## 2023-07-12 DIAGNOSIS — R0609 Other forms of dyspnea: Secondary | ICD-10-CM | POA: Diagnosis not present

## 2023-07-12 DIAGNOSIS — N1832 Chronic kidney disease, stage 3b: Secondary | ICD-10-CM | POA: Diagnosis not present

## 2023-07-12 DIAGNOSIS — I509 Heart failure, unspecified: Secondary | ICD-10-CM | POA: Diagnosis not present

## 2023-07-12 DIAGNOSIS — I10 Essential (primary) hypertension: Secondary | ICD-10-CM | POA: Diagnosis not present

## 2023-07-12 DIAGNOSIS — I350 Nonrheumatic aortic (valve) stenosis: Secondary | ICD-10-CM | POA: Diagnosis not present

## 2023-07-13 ENCOUNTER — Encounter: Payer: Medicare Other | Admitting: Cardiovascular Disease

## 2023-07-13 DIAGNOSIS — I509 Heart failure, unspecified: Secondary | ICD-10-CM | POA: Diagnosis not present

## 2023-07-13 DIAGNOSIS — I2510 Atherosclerotic heart disease of native coronary artery without angina pectoris: Secondary | ICD-10-CM | POA: Diagnosis not present

## 2023-07-13 DIAGNOSIS — R0609 Other forms of dyspnea: Secondary | ICD-10-CM | POA: Diagnosis not present

## 2023-07-13 DIAGNOSIS — N1832 Chronic kidney disease, stage 3b: Secondary | ICD-10-CM | POA: Diagnosis not present

## 2023-07-13 DIAGNOSIS — I10 Essential (primary) hypertension: Secondary | ICD-10-CM | POA: Diagnosis not present

## 2023-07-13 DIAGNOSIS — I350 Nonrheumatic aortic (valve) stenosis: Secondary | ICD-10-CM | POA: Diagnosis not present

## 2023-07-17 DIAGNOSIS — N1832 Chronic kidney disease, stage 3b: Secondary | ICD-10-CM | POA: Diagnosis not present

## 2023-07-17 DIAGNOSIS — I10 Essential (primary) hypertension: Secondary | ICD-10-CM | POA: Diagnosis not present

## 2023-07-17 DIAGNOSIS — I350 Nonrheumatic aortic (valve) stenosis: Secondary | ICD-10-CM | POA: Diagnosis not present

## 2023-07-17 DIAGNOSIS — R0609 Other forms of dyspnea: Secondary | ICD-10-CM | POA: Diagnosis not present

## 2023-07-17 DIAGNOSIS — I509 Heart failure, unspecified: Secondary | ICD-10-CM | POA: Diagnosis not present

## 2023-07-17 DIAGNOSIS — I2510 Atherosclerotic heart disease of native coronary artery without angina pectoris: Secondary | ICD-10-CM | POA: Diagnosis not present

## 2023-07-20 DIAGNOSIS — R0609 Other forms of dyspnea: Secondary | ICD-10-CM | POA: Diagnosis not present

## 2023-07-20 DIAGNOSIS — N1832 Chronic kidney disease, stage 3b: Secondary | ICD-10-CM | POA: Diagnosis not present

## 2023-07-20 DIAGNOSIS — I350 Nonrheumatic aortic (valve) stenosis: Secondary | ICD-10-CM | POA: Diagnosis not present

## 2023-07-20 DIAGNOSIS — I509 Heart failure, unspecified: Secondary | ICD-10-CM | POA: Diagnosis not present

## 2023-07-20 DIAGNOSIS — I2510 Atherosclerotic heart disease of native coronary artery without angina pectoris: Secondary | ICD-10-CM | POA: Diagnosis not present

## 2023-07-20 DIAGNOSIS — I10 Essential (primary) hypertension: Secondary | ICD-10-CM | POA: Diagnosis not present

## 2023-07-24 DIAGNOSIS — I10 Essential (primary) hypertension: Secondary | ICD-10-CM | POA: Diagnosis not present

## 2023-07-24 DIAGNOSIS — I2510 Atherosclerotic heart disease of native coronary artery without angina pectoris: Secondary | ICD-10-CM | POA: Diagnosis not present

## 2023-07-24 DIAGNOSIS — R0609 Other forms of dyspnea: Secondary | ICD-10-CM | POA: Diagnosis not present

## 2023-07-24 DIAGNOSIS — N1832 Chronic kidney disease, stage 3b: Secondary | ICD-10-CM | POA: Diagnosis not present

## 2023-07-24 DIAGNOSIS — I509 Heart failure, unspecified: Secondary | ICD-10-CM | POA: Diagnosis not present

## 2023-07-24 DIAGNOSIS — I350 Nonrheumatic aortic (valve) stenosis: Secondary | ICD-10-CM | POA: Diagnosis not present

## 2023-07-26 DIAGNOSIS — R0609 Other forms of dyspnea: Secondary | ICD-10-CM | POA: Diagnosis not present

## 2023-07-26 DIAGNOSIS — I10 Essential (primary) hypertension: Secondary | ICD-10-CM | POA: Diagnosis not present

## 2023-07-26 DIAGNOSIS — I350 Nonrheumatic aortic (valve) stenosis: Secondary | ICD-10-CM | POA: Diagnosis not present

## 2023-07-26 DIAGNOSIS — I509 Heart failure, unspecified: Secondary | ICD-10-CM | POA: Diagnosis not present

## 2023-07-26 DIAGNOSIS — N1832 Chronic kidney disease, stage 3b: Secondary | ICD-10-CM | POA: Diagnosis not present

## 2023-07-26 DIAGNOSIS — I2510 Atherosclerotic heart disease of native coronary artery without angina pectoris: Secondary | ICD-10-CM | POA: Diagnosis not present

## 2023-07-27 DIAGNOSIS — N1832 Chronic kidney disease, stage 3b: Secondary | ICD-10-CM | POA: Diagnosis not present

## 2023-07-27 DIAGNOSIS — R0609 Other forms of dyspnea: Secondary | ICD-10-CM | POA: Diagnosis not present

## 2023-07-27 DIAGNOSIS — I509 Heart failure, unspecified: Secondary | ICD-10-CM | POA: Diagnosis not present

## 2023-07-27 DIAGNOSIS — I350 Nonrheumatic aortic (valve) stenosis: Secondary | ICD-10-CM | POA: Diagnosis not present

## 2023-07-27 DIAGNOSIS — I10 Essential (primary) hypertension: Secondary | ICD-10-CM | POA: Diagnosis not present

## 2023-07-27 DIAGNOSIS — I2510 Atherosclerotic heart disease of native coronary artery without angina pectoris: Secondary | ICD-10-CM | POA: Diagnosis not present

## 2023-07-31 DIAGNOSIS — I350 Nonrheumatic aortic (valve) stenosis: Secondary | ICD-10-CM | POA: Diagnosis not present

## 2023-07-31 DIAGNOSIS — R0609 Other forms of dyspnea: Secondary | ICD-10-CM | POA: Diagnosis not present

## 2023-07-31 DIAGNOSIS — N1832 Chronic kidney disease, stage 3b: Secondary | ICD-10-CM | POA: Diagnosis not present

## 2023-07-31 DIAGNOSIS — I509 Heart failure, unspecified: Secondary | ICD-10-CM | POA: Diagnosis not present

## 2023-07-31 DIAGNOSIS — I10 Essential (primary) hypertension: Secondary | ICD-10-CM | POA: Diagnosis not present

## 2023-07-31 DIAGNOSIS — I2510 Atherosclerotic heart disease of native coronary artery without angina pectoris: Secondary | ICD-10-CM | POA: Diagnosis not present

## 2023-08-01 DIAGNOSIS — E039 Hypothyroidism, unspecified: Secondary | ICD-10-CM | POA: Diagnosis not present

## 2023-08-01 DIAGNOSIS — I509 Heart failure, unspecified: Secondary | ICD-10-CM | POA: Diagnosis not present

## 2023-08-01 DIAGNOSIS — I5032 Chronic diastolic (congestive) heart failure: Secondary | ICD-10-CM | POA: Diagnosis not present

## 2023-08-01 DIAGNOSIS — I10 Essential (primary) hypertension: Secondary | ICD-10-CM | POA: Diagnosis not present

## 2023-08-01 DIAGNOSIS — I480 Paroxysmal atrial fibrillation: Secondary | ICD-10-CM | POA: Diagnosis not present

## 2023-08-01 DIAGNOSIS — I350 Nonrheumatic aortic (valve) stenosis: Secondary | ICD-10-CM | POA: Diagnosis not present

## 2023-08-01 DIAGNOSIS — Z950 Presence of cardiac pacemaker: Secondary | ICD-10-CM | POA: Diagnosis not present

## 2023-08-01 DIAGNOSIS — I2510 Atherosclerotic heart disease of native coronary artery without angina pectoris: Secondary | ICD-10-CM | POA: Diagnosis not present

## 2023-08-01 DIAGNOSIS — N1832 Chronic kidney disease, stage 3b: Secondary | ICD-10-CM | POA: Diagnosis not present

## 2023-08-01 DIAGNOSIS — R0609 Other forms of dyspnea: Secondary | ICD-10-CM | POA: Diagnosis not present

## 2023-08-01 DIAGNOSIS — I200 Unstable angina: Secondary | ICD-10-CM | POA: Diagnosis not present

## 2023-08-01 DIAGNOSIS — K219 Gastro-esophageal reflux disease without esophagitis: Secondary | ICD-10-CM | POA: Diagnosis not present

## 2023-08-01 DIAGNOSIS — F411 Generalized anxiety disorder: Secondary | ICD-10-CM | POA: Diagnosis not present

## 2023-08-02 DIAGNOSIS — I2510 Atherosclerotic heart disease of native coronary artery without angina pectoris: Secondary | ICD-10-CM | POA: Diagnosis not present

## 2023-08-02 DIAGNOSIS — I509 Heart failure, unspecified: Secondary | ICD-10-CM | POA: Diagnosis not present

## 2023-08-02 DIAGNOSIS — R0609 Other forms of dyspnea: Secondary | ICD-10-CM | POA: Diagnosis not present

## 2023-08-02 DIAGNOSIS — I350 Nonrheumatic aortic (valve) stenosis: Secondary | ICD-10-CM | POA: Diagnosis not present

## 2023-08-02 DIAGNOSIS — N1832 Chronic kidney disease, stage 3b: Secondary | ICD-10-CM | POA: Diagnosis not present

## 2023-08-02 DIAGNOSIS — I10 Essential (primary) hypertension: Secondary | ICD-10-CM | POA: Diagnosis not present

## 2023-08-03 DIAGNOSIS — N1832 Chronic kidney disease, stage 3b: Secondary | ICD-10-CM | POA: Diagnosis not present

## 2023-08-03 DIAGNOSIS — R0609 Other forms of dyspnea: Secondary | ICD-10-CM | POA: Diagnosis not present

## 2023-08-03 DIAGNOSIS — I509 Heart failure, unspecified: Secondary | ICD-10-CM | POA: Diagnosis not present

## 2023-08-03 DIAGNOSIS — I350 Nonrheumatic aortic (valve) stenosis: Secondary | ICD-10-CM | POA: Diagnosis not present

## 2023-08-03 DIAGNOSIS — I10 Essential (primary) hypertension: Secondary | ICD-10-CM | POA: Diagnosis not present

## 2023-08-03 DIAGNOSIS — I2510 Atherosclerotic heart disease of native coronary artery without angina pectoris: Secondary | ICD-10-CM | POA: Diagnosis not present

## 2023-08-07 DIAGNOSIS — I509 Heart failure, unspecified: Secondary | ICD-10-CM | POA: Diagnosis not present

## 2023-08-07 DIAGNOSIS — I350 Nonrheumatic aortic (valve) stenosis: Secondary | ICD-10-CM | POA: Diagnosis not present

## 2023-08-07 DIAGNOSIS — I2510 Atherosclerotic heart disease of native coronary artery without angina pectoris: Secondary | ICD-10-CM | POA: Diagnosis not present

## 2023-08-07 DIAGNOSIS — R0609 Other forms of dyspnea: Secondary | ICD-10-CM | POA: Diagnosis not present

## 2023-08-07 DIAGNOSIS — I10 Essential (primary) hypertension: Secondary | ICD-10-CM | POA: Diagnosis not present

## 2023-08-07 DIAGNOSIS — N1832 Chronic kidney disease, stage 3b: Secondary | ICD-10-CM | POA: Diagnosis not present

## 2023-08-09 DIAGNOSIS — I350 Nonrheumatic aortic (valve) stenosis: Secondary | ICD-10-CM | POA: Diagnosis not present

## 2023-08-09 DIAGNOSIS — I509 Heart failure, unspecified: Secondary | ICD-10-CM | POA: Diagnosis not present

## 2023-08-09 DIAGNOSIS — I10 Essential (primary) hypertension: Secondary | ICD-10-CM | POA: Diagnosis not present

## 2023-08-09 DIAGNOSIS — N1832 Chronic kidney disease, stage 3b: Secondary | ICD-10-CM | POA: Diagnosis not present

## 2023-08-09 DIAGNOSIS — R0609 Other forms of dyspnea: Secondary | ICD-10-CM | POA: Diagnosis not present

## 2023-08-09 DIAGNOSIS — I2510 Atherosclerotic heart disease of native coronary artery without angina pectoris: Secondary | ICD-10-CM | POA: Diagnosis not present

## 2023-08-10 ENCOUNTER — Ambulatory Visit: Payer: Medicare Other

## 2023-08-11 ENCOUNTER — Telehealth: Payer: Self-pay

## 2023-08-11 LAB — CUP PACEART REMOTE DEVICE CHECK
Battery Remaining Longevity: 100 mo
Battery Voltage: 2.99 V
Brady Statistic AP VP Percent: 3.68 %
Brady Statistic AP VS Percent: 94.46 %
Brady Statistic AS VP Percent: 1.26 %
Brady Statistic AS VS Percent: 0.59 %
Brady Statistic RA Percent Paced: 97.37 %
Brady Statistic RV Percent Paced: 5.35 %
Date Time Interrogation Session: 20241011155813
Implantable Lead Connection Status: 753985
Implantable Lead Connection Status: 753985
Implantable Lead Implant Date: 20100309
Implantable Lead Implant Date: 20100309
Implantable Lead Location: 753859
Implantable Lead Location: 753860
Implantable Lead Model: 5076
Implantable Lead Model: 5076
Implantable Pulse Generator Implant Date: 20190327
Lead Channel Impedance Value: 323 Ohm
Lead Channel Impedance Value: 399 Ohm
Lead Channel Impedance Value: 513 Ohm
Lead Channel Impedance Value: 570 Ohm
Lead Channel Pacing Threshold Amplitude: 0.5 V
Lead Channel Pacing Threshold Amplitude: 0.875 V
Lead Channel Pacing Threshold Pulse Width: 0.4 ms
Lead Channel Pacing Threshold Pulse Width: 0.4 ms
Lead Channel Sensing Intrinsic Amplitude: 1.75 mV
Lead Channel Sensing Intrinsic Amplitude: 1.75 mV
Lead Channel Sensing Intrinsic Amplitude: 10.625 mV
Lead Channel Sensing Intrinsic Amplitude: 10.625 mV
Lead Channel Setting Pacing Amplitude: 2 V
Lead Channel Setting Pacing Amplitude: 2.5 V
Lead Channel Setting Pacing Pulse Width: 0.4 ms
Lead Channel Setting Sensing Sensitivity: 1.2 mV
Zone Setting Status: 755011
Zone Setting Status: 755011

## 2023-08-11 NOTE — Telephone Encounter (Signed)
Pt daughter called letting us know pt is in hospice and no longer wants to be monitored. I have canceled all appointments

## 2023-08-14 DIAGNOSIS — I509 Heart failure, unspecified: Secondary | ICD-10-CM | POA: Diagnosis not present

## 2023-08-14 DIAGNOSIS — I350 Nonrheumatic aortic (valve) stenosis: Secondary | ICD-10-CM | POA: Diagnosis not present

## 2023-08-14 DIAGNOSIS — N1832 Chronic kidney disease, stage 3b: Secondary | ICD-10-CM | POA: Diagnosis not present

## 2023-08-14 DIAGNOSIS — I2510 Atherosclerotic heart disease of native coronary artery without angina pectoris: Secondary | ICD-10-CM | POA: Diagnosis not present

## 2023-08-14 DIAGNOSIS — I10 Essential (primary) hypertension: Secondary | ICD-10-CM | POA: Diagnosis not present

## 2023-08-14 DIAGNOSIS — R0609 Other forms of dyspnea: Secondary | ICD-10-CM | POA: Diagnosis not present

## 2023-08-15 DIAGNOSIS — I10 Essential (primary) hypertension: Secondary | ICD-10-CM | POA: Diagnosis not present

## 2023-08-15 DIAGNOSIS — I509 Heart failure, unspecified: Secondary | ICD-10-CM | POA: Diagnosis not present

## 2023-08-15 DIAGNOSIS — N1832 Chronic kidney disease, stage 3b: Secondary | ICD-10-CM | POA: Diagnosis not present

## 2023-08-15 DIAGNOSIS — I350 Nonrheumatic aortic (valve) stenosis: Secondary | ICD-10-CM | POA: Diagnosis not present

## 2023-08-15 DIAGNOSIS — I2510 Atherosclerotic heart disease of native coronary artery without angina pectoris: Secondary | ICD-10-CM | POA: Diagnosis not present

## 2023-08-15 DIAGNOSIS — R0609 Other forms of dyspnea: Secondary | ICD-10-CM | POA: Diagnosis not present

## 2023-08-16 DIAGNOSIS — N1832 Chronic kidney disease, stage 3b: Secondary | ICD-10-CM | POA: Diagnosis not present

## 2023-08-16 DIAGNOSIS — R0609 Other forms of dyspnea: Secondary | ICD-10-CM | POA: Diagnosis not present

## 2023-08-16 DIAGNOSIS — I350 Nonrheumatic aortic (valve) stenosis: Secondary | ICD-10-CM | POA: Diagnosis not present

## 2023-08-16 DIAGNOSIS — I509 Heart failure, unspecified: Secondary | ICD-10-CM | POA: Diagnosis not present

## 2023-08-16 DIAGNOSIS — I10 Essential (primary) hypertension: Secondary | ICD-10-CM | POA: Diagnosis not present

## 2023-08-16 DIAGNOSIS — I2510 Atherosclerotic heart disease of native coronary artery without angina pectoris: Secondary | ICD-10-CM | POA: Diagnosis not present

## 2023-08-17 DIAGNOSIS — I350 Nonrheumatic aortic (valve) stenosis: Secondary | ICD-10-CM | POA: Diagnosis not present

## 2023-08-17 DIAGNOSIS — I509 Heart failure, unspecified: Secondary | ICD-10-CM | POA: Diagnosis not present

## 2023-08-17 DIAGNOSIS — I10 Essential (primary) hypertension: Secondary | ICD-10-CM | POA: Diagnosis not present

## 2023-08-17 DIAGNOSIS — R0609 Other forms of dyspnea: Secondary | ICD-10-CM | POA: Diagnosis not present

## 2023-08-17 DIAGNOSIS — I2510 Atherosclerotic heart disease of native coronary artery without angina pectoris: Secondary | ICD-10-CM | POA: Diagnosis not present

## 2023-08-17 DIAGNOSIS — N1832 Chronic kidney disease, stage 3b: Secondary | ICD-10-CM | POA: Diagnosis not present

## 2023-08-21 DIAGNOSIS — I350 Nonrheumatic aortic (valve) stenosis: Secondary | ICD-10-CM | POA: Diagnosis not present

## 2023-08-21 DIAGNOSIS — I2510 Atherosclerotic heart disease of native coronary artery without angina pectoris: Secondary | ICD-10-CM | POA: Diagnosis not present

## 2023-08-21 DIAGNOSIS — N1832 Chronic kidney disease, stage 3b: Secondary | ICD-10-CM | POA: Diagnosis not present

## 2023-08-21 DIAGNOSIS — I10 Essential (primary) hypertension: Secondary | ICD-10-CM | POA: Diagnosis not present

## 2023-08-21 DIAGNOSIS — I509 Heart failure, unspecified: Secondary | ICD-10-CM | POA: Diagnosis not present

## 2023-08-21 DIAGNOSIS — R0609 Other forms of dyspnea: Secondary | ICD-10-CM | POA: Diagnosis not present

## 2023-08-23 DIAGNOSIS — R0609 Other forms of dyspnea: Secondary | ICD-10-CM | POA: Diagnosis not present

## 2023-08-23 DIAGNOSIS — I10 Essential (primary) hypertension: Secondary | ICD-10-CM | POA: Diagnosis not present

## 2023-08-23 DIAGNOSIS — I350 Nonrheumatic aortic (valve) stenosis: Secondary | ICD-10-CM | POA: Diagnosis not present

## 2023-08-23 DIAGNOSIS — N1832 Chronic kidney disease, stage 3b: Secondary | ICD-10-CM | POA: Diagnosis not present

## 2023-08-23 DIAGNOSIS — I2510 Atherosclerotic heart disease of native coronary artery without angina pectoris: Secondary | ICD-10-CM | POA: Diagnosis not present

## 2023-08-23 DIAGNOSIS — I509 Heart failure, unspecified: Secondary | ICD-10-CM | POA: Diagnosis not present

## 2023-08-25 DIAGNOSIS — R0609 Other forms of dyspnea: Secondary | ICD-10-CM | POA: Diagnosis not present

## 2023-08-25 DIAGNOSIS — I509 Heart failure, unspecified: Secondary | ICD-10-CM | POA: Diagnosis not present

## 2023-08-25 DIAGNOSIS — I10 Essential (primary) hypertension: Secondary | ICD-10-CM | POA: Diagnosis not present

## 2023-08-25 DIAGNOSIS — N1832 Chronic kidney disease, stage 3b: Secondary | ICD-10-CM | POA: Diagnosis not present

## 2023-08-25 DIAGNOSIS — I350 Nonrheumatic aortic (valve) stenosis: Secondary | ICD-10-CM | POA: Diagnosis not present

## 2023-08-25 DIAGNOSIS — I2510 Atherosclerotic heart disease of native coronary artery without angina pectoris: Secondary | ICD-10-CM | POA: Diagnosis not present

## 2023-08-28 DIAGNOSIS — I509 Heart failure, unspecified: Secondary | ICD-10-CM | POA: Diagnosis not present

## 2023-08-28 DIAGNOSIS — R0609 Other forms of dyspnea: Secondary | ICD-10-CM | POA: Diagnosis not present

## 2023-08-28 DIAGNOSIS — N1832 Chronic kidney disease, stage 3b: Secondary | ICD-10-CM | POA: Diagnosis not present

## 2023-08-28 DIAGNOSIS — I2510 Atherosclerotic heart disease of native coronary artery without angina pectoris: Secondary | ICD-10-CM | POA: Diagnosis not present

## 2023-08-28 DIAGNOSIS — I10 Essential (primary) hypertension: Secondary | ICD-10-CM | POA: Diagnosis not present

## 2023-08-28 DIAGNOSIS — I350 Nonrheumatic aortic (valve) stenosis: Secondary | ICD-10-CM | POA: Diagnosis not present

## 2023-08-29 DIAGNOSIS — I509 Heart failure, unspecified: Secondary | ICD-10-CM | POA: Diagnosis not present

## 2023-08-29 DIAGNOSIS — I2510 Atherosclerotic heart disease of native coronary artery without angina pectoris: Secondary | ICD-10-CM | POA: Diagnosis not present

## 2023-08-29 DIAGNOSIS — R0609 Other forms of dyspnea: Secondary | ICD-10-CM | POA: Diagnosis not present

## 2023-08-29 DIAGNOSIS — I10 Essential (primary) hypertension: Secondary | ICD-10-CM | POA: Diagnosis not present

## 2023-08-29 DIAGNOSIS — N1832 Chronic kidney disease, stage 3b: Secondary | ICD-10-CM | POA: Diagnosis not present

## 2023-08-29 DIAGNOSIS — I350 Nonrheumatic aortic (valve) stenosis: Secondary | ICD-10-CM | POA: Diagnosis not present

## 2023-08-30 DIAGNOSIS — I350 Nonrheumatic aortic (valve) stenosis: Secondary | ICD-10-CM | POA: Diagnosis not present

## 2023-08-30 DIAGNOSIS — N1832 Chronic kidney disease, stage 3b: Secondary | ICD-10-CM | POA: Diagnosis not present

## 2023-08-30 DIAGNOSIS — I2510 Atherosclerotic heart disease of native coronary artery without angina pectoris: Secondary | ICD-10-CM | POA: Diagnosis not present

## 2023-08-30 DIAGNOSIS — R0609 Other forms of dyspnea: Secondary | ICD-10-CM | POA: Diagnosis not present

## 2023-08-30 DIAGNOSIS — I10 Essential (primary) hypertension: Secondary | ICD-10-CM | POA: Diagnosis not present

## 2023-08-30 DIAGNOSIS — I509 Heart failure, unspecified: Secondary | ICD-10-CM | POA: Diagnosis not present

## 2023-09-01 DIAGNOSIS — N1832 Chronic kidney disease, stage 3b: Secondary | ICD-10-CM | POA: Diagnosis not present

## 2023-09-01 DIAGNOSIS — K219 Gastro-esophageal reflux disease without esophagitis: Secondary | ICD-10-CM | POA: Diagnosis not present

## 2023-09-01 DIAGNOSIS — E039 Hypothyroidism, unspecified: Secondary | ICD-10-CM | POA: Diagnosis not present

## 2023-09-01 DIAGNOSIS — Z950 Presence of cardiac pacemaker: Secondary | ICD-10-CM | POA: Diagnosis not present

## 2023-09-01 DIAGNOSIS — I5032 Chronic diastolic (congestive) heart failure: Secondary | ICD-10-CM | POA: Diagnosis not present

## 2023-09-01 DIAGNOSIS — I480 Paroxysmal atrial fibrillation: Secondary | ICD-10-CM | POA: Diagnosis not present

## 2023-09-01 DIAGNOSIS — I200 Unstable angina: Secondary | ICD-10-CM | POA: Diagnosis not present

## 2023-09-01 DIAGNOSIS — I509 Heart failure, unspecified: Secondary | ICD-10-CM | POA: Diagnosis not present

## 2023-09-01 DIAGNOSIS — F411 Generalized anxiety disorder: Secondary | ICD-10-CM | POA: Diagnosis not present

## 2023-09-01 DIAGNOSIS — R0609 Other forms of dyspnea: Secondary | ICD-10-CM | POA: Diagnosis not present

## 2023-09-01 DIAGNOSIS — I2510 Atherosclerotic heart disease of native coronary artery without angina pectoris: Secondary | ICD-10-CM | POA: Diagnosis not present

## 2023-09-01 DIAGNOSIS — I350 Nonrheumatic aortic (valve) stenosis: Secondary | ICD-10-CM | POA: Diagnosis not present

## 2023-09-01 DIAGNOSIS — I10 Essential (primary) hypertension: Secondary | ICD-10-CM | POA: Diagnosis not present

## 2023-09-04 DIAGNOSIS — N1832 Chronic kidney disease, stage 3b: Secondary | ICD-10-CM | POA: Diagnosis not present

## 2023-09-04 DIAGNOSIS — I2510 Atherosclerotic heart disease of native coronary artery without angina pectoris: Secondary | ICD-10-CM | POA: Diagnosis not present

## 2023-09-04 DIAGNOSIS — R0609 Other forms of dyspnea: Secondary | ICD-10-CM | POA: Diagnosis not present

## 2023-09-04 DIAGNOSIS — I10 Essential (primary) hypertension: Secondary | ICD-10-CM | POA: Diagnosis not present

## 2023-09-04 DIAGNOSIS — I350 Nonrheumatic aortic (valve) stenosis: Secondary | ICD-10-CM | POA: Diagnosis not present

## 2023-09-04 DIAGNOSIS — I509 Heart failure, unspecified: Secondary | ICD-10-CM | POA: Diagnosis not present

## 2023-09-05 DIAGNOSIS — I350 Nonrheumatic aortic (valve) stenosis: Secondary | ICD-10-CM | POA: Diagnosis not present

## 2023-09-05 DIAGNOSIS — R0609 Other forms of dyspnea: Secondary | ICD-10-CM | POA: Diagnosis not present

## 2023-09-05 DIAGNOSIS — I509 Heart failure, unspecified: Secondary | ICD-10-CM | POA: Diagnosis not present

## 2023-09-05 DIAGNOSIS — I10 Essential (primary) hypertension: Secondary | ICD-10-CM | POA: Diagnosis not present

## 2023-09-05 DIAGNOSIS — N1832 Chronic kidney disease, stage 3b: Secondary | ICD-10-CM | POA: Diagnosis not present

## 2023-09-05 DIAGNOSIS — I2510 Atherosclerotic heart disease of native coronary artery without angina pectoris: Secondary | ICD-10-CM | POA: Diagnosis not present

## 2023-09-06 DIAGNOSIS — I509 Heart failure, unspecified: Secondary | ICD-10-CM | POA: Diagnosis not present

## 2023-09-06 DIAGNOSIS — I2510 Atherosclerotic heart disease of native coronary artery without angina pectoris: Secondary | ICD-10-CM | POA: Diagnosis not present

## 2023-09-06 DIAGNOSIS — N1832 Chronic kidney disease, stage 3b: Secondary | ICD-10-CM | POA: Diagnosis not present

## 2023-09-06 DIAGNOSIS — I350 Nonrheumatic aortic (valve) stenosis: Secondary | ICD-10-CM | POA: Diagnosis not present

## 2023-09-06 DIAGNOSIS — I10 Essential (primary) hypertension: Secondary | ICD-10-CM | POA: Diagnosis not present

## 2023-09-06 DIAGNOSIS — R0609 Other forms of dyspnea: Secondary | ICD-10-CM | POA: Diagnosis not present

## 2023-09-07 DIAGNOSIS — R0609 Other forms of dyspnea: Secondary | ICD-10-CM | POA: Diagnosis not present

## 2023-09-07 DIAGNOSIS — N1832 Chronic kidney disease, stage 3b: Secondary | ICD-10-CM | POA: Diagnosis not present

## 2023-09-07 DIAGNOSIS — I2510 Atherosclerotic heart disease of native coronary artery without angina pectoris: Secondary | ICD-10-CM | POA: Diagnosis not present

## 2023-09-07 DIAGNOSIS — I509 Heart failure, unspecified: Secondary | ICD-10-CM | POA: Diagnosis not present

## 2023-09-07 DIAGNOSIS — I10 Essential (primary) hypertension: Secondary | ICD-10-CM | POA: Diagnosis not present

## 2023-09-07 DIAGNOSIS — I350 Nonrheumatic aortic (valve) stenosis: Secondary | ICD-10-CM | POA: Diagnosis not present

## 2023-09-08 DIAGNOSIS — I2510 Atherosclerotic heart disease of native coronary artery without angina pectoris: Secondary | ICD-10-CM | POA: Diagnosis not present

## 2023-09-08 DIAGNOSIS — I10 Essential (primary) hypertension: Secondary | ICD-10-CM | POA: Diagnosis not present

## 2023-09-08 DIAGNOSIS — I509 Heart failure, unspecified: Secondary | ICD-10-CM | POA: Diagnosis not present

## 2023-09-08 DIAGNOSIS — N1832 Chronic kidney disease, stage 3b: Secondary | ICD-10-CM | POA: Diagnosis not present

## 2023-09-08 DIAGNOSIS — R0609 Other forms of dyspnea: Secondary | ICD-10-CM | POA: Diagnosis not present

## 2023-09-08 DIAGNOSIS — I350 Nonrheumatic aortic (valve) stenosis: Secondary | ICD-10-CM | POA: Diagnosis not present

## 2023-09-09 DIAGNOSIS — I509 Heart failure, unspecified: Secondary | ICD-10-CM | POA: Diagnosis not present

## 2023-09-09 DIAGNOSIS — N1832 Chronic kidney disease, stage 3b: Secondary | ICD-10-CM | POA: Diagnosis not present

## 2023-09-09 DIAGNOSIS — I350 Nonrheumatic aortic (valve) stenosis: Secondary | ICD-10-CM | POA: Diagnosis not present

## 2023-09-09 DIAGNOSIS — I2510 Atherosclerotic heart disease of native coronary artery without angina pectoris: Secondary | ICD-10-CM | POA: Diagnosis not present

## 2023-09-09 DIAGNOSIS — R0609 Other forms of dyspnea: Secondary | ICD-10-CM | POA: Diagnosis not present

## 2023-09-09 DIAGNOSIS — I10 Essential (primary) hypertension: Secondary | ICD-10-CM | POA: Diagnosis not present

## 2023-09-10 DIAGNOSIS — R0609 Other forms of dyspnea: Secondary | ICD-10-CM | POA: Diagnosis not present

## 2023-09-10 DIAGNOSIS — I509 Heart failure, unspecified: Secondary | ICD-10-CM | POA: Diagnosis not present

## 2023-09-10 DIAGNOSIS — I350 Nonrheumatic aortic (valve) stenosis: Secondary | ICD-10-CM | POA: Diagnosis not present

## 2023-09-10 DIAGNOSIS — I2510 Atherosclerotic heart disease of native coronary artery without angina pectoris: Secondary | ICD-10-CM | POA: Diagnosis not present

## 2023-09-10 DIAGNOSIS — N1832 Chronic kidney disease, stage 3b: Secondary | ICD-10-CM | POA: Diagnosis not present

## 2023-09-10 DIAGNOSIS — I10 Essential (primary) hypertension: Secondary | ICD-10-CM | POA: Diagnosis not present

## 2023-09-11 DIAGNOSIS — N1832 Chronic kidney disease, stage 3b: Secondary | ICD-10-CM | POA: Diagnosis not present

## 2023-09-11 DIAGNOSIS — R0609 Other forms of dyspnea: Secondary | ICD-10-CM | POA: Diagnosis not present

## 2023-09-11 DIAGNOSIS — I2510 Atherosclerotic heart disease of native coronary artery without angina pectoris: Secondary | ICD-10-CM | POA: Diagnosis not present

## 2023-09-11 DIAGNOSIS — I509 Heart failure, unspecified: Secondary | ICD-10-CM | POA: Diagnosis not present

## 2023-09-11 DIAGNOSIS — I10 Essential (primary) hypertension: Secondary | ICD-10-CM | POA: Diagnosis not present

## 2023-09-11 DIAGNOSIS — I350 Nonrheumatic aortic (valve) stenosis: Secondary | ICD-10-CM | POA: Diagnosis not present

## 2023-09-13 DIAGNOSIS — I350 Nonrheumatic aortic (valve) stenosis: Secondary | ICD-10-CM | POA: Diagnosis not present

## 2023-09-13 DIAGNOSIS — I509 Heart failure, unspecified: Secondary | ICD-10-CM | POA: Diagnosis not present

## 2023-09-13 DIAGNOSIS — I10 Essential (primary) hypertension: Secondary | ICD-10-CM | POA: Diagnosis not present

## 2023-09-13 DIAGNOSIS — R0609 Other forms of dyspnea: Secondary | ICD-10-CM | POA: Diagnosis not present

## 2023-09-13 DIAGNOSIS — I2510 Atherosclerotic heart disease of native coronary artery without angina pectoris: Secondary | ICD-10-CM | POA: Diagnosis not present

## 2023-09-13 DIAGNOSIS — N1832 Chronic kidney disease, stage 3b: Secondary | ICD-10-CM | POA: Diagnosis not present

## 2023-09-15 DIAGNOSIS — I350 Nonrheumatic aortic (valve) stenosis: Secondary | ICD-10-CM | POA: Diagnosis not present

## 2023-09-15 DIAGNOSIS — I509 Heart failure, unspecified: Secondary | ICD-10-CM | POA: Diagnosis not present

## 2023-09-15 DIAGNOSIS — N1832 Chronic kidney disease, stage 3b: Secondary | ICD-10-CM | POA: Diagnosis not present

## 2023-09-15 DIAGNOSIS — I10 Essential (primary) hypertension: Secondary | ICD-10-CM | POA: Diagnosis not present

## 2023-09-15 DIAGNOSIS — I2510 Atherosclerotic heart disease of native coronary artery without angina pectoris: Secondary | ICD-10-CM | POA: Diagnosis not present

## 2023-09-15 DIAGNOSIS — R0609 Other forms of dyspnea: Secondary | ICD-10-CM | POA: Diagnosis not present

## 2023-09-18 DIAGNOSIS — I350 Nonrheumatic aortic (valve) stenosis: Secondary | ICD-10-CM | POA: Diagnosis not present

## 2023-09-18 DIAGNOSIS — I509 Heart failure, unspecified: Secondary | ICD-10-CM | POA: Diagnosis not present

## 2023-09-18 DIAGNOSIS — N1832 Chronic kidney disease, stage 3b: Secondary | ICD-10-CM | POA: Diagnosis not present

## 2023-09-18 DIAGNOSIS — I10 Essential (primary) hypertension: Secondary | ICD-10-CM | POA: Diagnosis not present

## 2023-09-18 DIAGNOSIS — I2510 Atherosclerotic heart disease of native coronary artery without angina pectoris: Secondary | ICD-10-CM | POA: Diagnosis not present

## 2023-09-18 DIAGNOSIS — R0609 Other forms of dyspnea: Secondary | ICD-10-CM | POA: Diagnosis not present

## 2023-09-20 DIAGNOSIS — N1832 Chronic kidney disease, stage 3b: Secondary | ICD-10-CM | POA: Diagnosis not present

## 2023-09-20 DIAGNOSIS — I2510 Atherosclerotic heart disease of native coronary artery without angina pectoris: Secondary | ICD-10-CM | POA: Diagnosis not present

## 2023-09-20 DIAGNOSIS — R0609 Other forms of dyspnea: Secondary | ICD-10-CM | POA: Diagnosis not present

## 2023-09-20 DIAGNOSIS — I350 Nonrheumatic aortic (valve) stenosis: Secondary | ICD-10-CM | POA: Diagnosis not present

## 2023-09-20 DIAGNOSIS — I509 Heart failure, unspecified: Secondary | ICD-10-CM | POA: Diagnosis not present

## 2023-09-20 DIAGNOSIS — I10 Essential (primary) hypertension: Secondary | ICD-10-CM | POA: Diagnosis not present

## 2023-09-22 DIAGNOSIS — I10 Essential (primary) hypertension: Secondary | ICD-10-CM | POA: Diagnosis not present

## 2023-09-22 DIAGNOSIS — I509 Heart failure, unspecified: Secondary | ICD-10-CM | POA: Diagnosis not present

## 2023-09-22 DIAGNOSIS — N1832 Chronic kidney disease, stage 3b: Secondary | ICD-10-CM | POA: Diagnosis not present

## 2023-09-22 DIAGNOSIS — R0609 Other forms of dyspnea: Secondary | ICD-10-CM | POA: Diagnosis not present

## 2023-09-22 DIAGNOSIS — I2510 Atherosclerotic heart disease of native coronary artery without angina pectoris: Secondary | ICD-10-CM | POA: Diagnosis not present

## 2023-09-22 DIAGNOSIS — I350 Nonrheumatic aortic (valve) stenosis: Secondary | ICD-10-CM | POA: Diagnosis not present

## 2023-09-25 DIAGNOSIS — N1832 Chronic kidney disease, stage 3b: Secondary | ICD-10-CM | POA: Diagnosis not present

## 2023-09-25 DIAGNOSIS — I10 Essential (primary) hypertension: Secondary | ICD-10-CM | POA: Diagnosis not present

## 2023-09-25 DIAGNOSIS — I509 Heart failure, unspecified: Secondary | ICD-10-CM | POA: Diagnosis not present

## 2023-09-25 DIAGNOSIS — I350 Nonrheumatic aortic (valve) stenosis: Secondary | ICD-10-CM | POA: Diagnosis not present

## 2023-09-25 DIAGNOSIS — R0609 Other forms of dyspnea: Secondary | ICD-10-CM | POA: Diagnosis not present

## 2023-09-25 DIAGNOSIS — I2510 Atherosclerotic heart disease of native coronary artery without angina pectoris: Secondary | ICD-10-CM | POA: Diagnosis not present

## 2023-09-26 DIAGNOSIS — I10 Essential (primary) hypertension: Secondary | ICD-10-CM | POA: Diagnosis not present

## 2023-09-26 DIAGNOSIS — N1832 Chronic kidney disease, stage 3b: Secondary | ICD-10-CM | POA: Diagnosis not present

## 2023-09-26 DIAGNOSIS — I2510 Atherosclerotic heart disease of native coronary artery without angina pectoris: Secondary | ICD-10-CM | POA: Diagnosis not present

## 2023-09-26 DIAGNOSIS — I509 Heart failure, unspecified: Secondary | ICD-10-CM | POA: Diagnosis not present

## 2023-09-26 DIAGNOSIS — R0609 Other forms of dyspnea: Secondary | ICD-10-CM | POA: Diagnosis not present

## 2023-09-26 DIAGNOSIS — I350 Nonrheumatic aortic (valve) stenosis: Secondary | ICD-10-CM | POA: Diagnosis not present

## 2023-09-27 DIAGNOSIS — I10 Essential (primary) hypertension: Secondary | ICD-10-CM | POA: Diagnosis not present

## 2023-09-27 DIAGNOSIS — I509 Heart failure, unspecified: Secondary | ICD-10-CM | POA: Diagnosis not present

## 2023-09-27 DIAGNOSIS — R0609 Other forms of dyspnea: Secondary | ICD-10-CM | POA: Diagnosis not present

## 2023-09-27 DIAGNOSIS — N1832 Chronic kidney disease, stage 3b: Secondary | ICD-10-CM | POA: Diagnosis not present

## 2023-09-27 DIAGNOSIS — I350 Nonrheumatic aortic (valve) stenosis: Secondary | ICD-10-CM | POA: Diagnosis not present

## 2023-09-27 DIAGNOSIS — I2510 Atherosclerotic heart disease of native coronary artery without angina pectoris: Secondary | ICD-10-CM | POA: Diagnosis not present

## 2023-09-29 DIAGNOSIS — N1832 Chronic kidney disease, stage 3b: Secondary | ICD-10-CM | POA: Diagnosis not present

## 2023-09-29 DIAGNOSIS — I2510 Atherosclerotic heart disease of native coronary artery without angina pectoris: Secondary | ICD-10-CM | POA: Diagnosis not present

## 2023-09-29 DIAGNOSIS — I509 Heart failure, unspecified: Secondary | ICD-10-CM | POA: Diagnosis not present

## 2023-09-29 DIAGNOSIS — R0609 Other forms of dyspnea: Secondary | ICD-10-CM | POA: Diagnosis not present

## 2023-09-29 DIAGNOSIS — I350 Nonrheumatic aortic (valve) stenosis: Secondary | ICD-10-CM | POA: Diagnosis not present

## 2023-09-29 DIAGNOSIS — I10 Essential (primary) hypertension: Secondary | ICD-10-CM | POA: Diagnosis not present

## 2023-10-01 DIAGNOSIS — I10 Essential (primary) hypertension: Secondary | ICD-10-CM | POA: Diagnosis not present

## 2023-10-01 DIAGNOSIS — Z950 Presence of cardiac pacemaker: Secondary | ICD-10-CM | POA: Diagnosis not present

## 2023-10-01 DIAGNOSIS — R0609 Other forms of dyspnea: Secondary | ICD-10-CM | POA: Diagnosis not present

## 2023-10-01 DIAGNOSIS — K219 Gastro-esophageal reflux disease without esophagitis: Secondary | ICD-10-CM | POA: Diagnosis not present

## 2023-10-01 DIAGNOSIS — I350 Nonrheumatic aortic (valve) stenosis: Secondary | ICD-10-CM | POA: Diagnosis not present

## 2023-10-01 DIAGNOSIS — I480 Paroxysmal atrial fibrillation: Secondary | ICD-10-CM | POA: Diagnosis not present

## 2023-10-01 DIAGNOSIS — I200 Unstable angina: Secondary | ICD-10-CM | POA: Diagnosis not present

## 2023-10-01 DIAGNOSIS — N1832 Chronic kidney disease, stage 3b: Secondary | ICD-10-CM | POA: Diagnosis not present

## 2023-10-01 DIAGNOSIS — I2510 Atherosclerotic heart disease of native coronary artery without angina pectoris: Secondary | ICD-10-CM | POA: Diagnosis not present

## 2023-10-01 DIAGNOSIS — I509 Heart failure, unspecified: Secondary | ICD-10-CM | POA: Diagnosis not present

## 2023-10-01 DIAGNOSIS — E039 Hypothyroidism, unspecified: Secondary | ICD-10-CM | POA: Diagnosis not present

## 2023-10-01 DIAGNOSIS — F411 Generalized anxiety disorder: Secondary | ICD-10-CM | POA: Diagnosis not present

## 2023-10-01 DIAGNOSIS — I5032 Chronic diastolic (congestive) heart failure: Secondary | ICD-10-CM | POA: Diagnosis not present

## 2023-10-02 DIAGNOSIS — I350 Nonrheumatic aortic (valve) stenosis: Secondary | ICD-10-CM | POA: Diagnosis not present

## 2023-10-02 DIAGNOSIS — N1832 Chronic kidney disease, stage 3b: Secondary | ICD-10-CM | POA: Diagnosis not present

## 2023-10-02 DIAGNOSIS — I10 Essential (primary) hypertension: Secondary | ICD-10-CM | POA: Diagnosis not present

## 2023-10-02 DIAGNOSIS — I509 Heart failure, unspecified: Secondary | ICD-10-CM | POA: Diagnosis not present

## 2023-10-02 DIAGNOSIS — R0609 Other forms of dyspnea: Secondary | ICD-10-CM | POA: Diagnosis not present

## 2023-10-02 DIAGNOSIS — I2510 Atherosclerotic heart disease of native coronary artery without angina pectoris: Secondary | ICD-10-CM | POA: Diagnosis not present

## 2023-10-03 DIAGNOSIS — I2510 Atherosclerotic heart disease of native coronary artery without angina pectoris: Secondary | ICD-10-CM | POA: Diagnosis not present

## 2023-10-03 DIAGNOSIS — I350 Nonrheumatic aortic (valve) stenosis: Secondary | ICD-10-CM | POA: Diagnosis not present

## 2023-10-03 DIAGNOSIS — I10 Essential (primary) hypertension: Secondary | ICD-10-CM | POA: Diagnosis not present

## 2023-10-03 DIAGNOSIS — R0609 Other forms of dyspnea: Secondary | ICD-10-CM | POA: Diagnosis not present

## 2023-10-03 DIAGNOSIS — I509 Heart failure, unspecified: Secondary | ICD-10-CM | POA: Diagnosis not present

## 2023-10-03 DIAGNOSIS — N1832 Chronic kidney disease, stage 3b: Secondary | ICD-10-CM | POA: Diagnosis not present

## 2023-10-04 DIAGNOSIS — R0609 Other forms of dyspnea: Secondary | ICD-10-CM | POA: Diagnosis not present

## 2023-10-04 DIAGNOSIS — I2510 Atherosclerotic heart disease of native coronary artery without angina pectoris: Secondary | ICD-10-CM | POA: Diagnosis not present

## 2023-10-04 DIAGNOSIS — I350 Nonrheumatic aortic (valve) stenosis: Secondary | ICD-10-CM | POA: Diagnosis not present

## 2023-10-04 DIAGNOSIS — I509 Heart failure, unspecified: Secondary | ICD-10-CM | POA: Diagnosis not present

## 2023-10-04 DIAGNOSIS — N1832 Chronic kidney disease, stage 3b: Secondary | ICD-10-CM | POA: Diagnosis not present

## 2023-10-04 DIAGNOSIS — I10 Essential (primary) hypertension: Secondary | ICD-10-CM | POA: Diagnosis not present

## 2023-10-05 DIAGNOSIS — I350 Nonrheumatic aortic (valve) stenosis: Secondary | ICD-10-CM | POA: Diagnosis not present

## 2023-10-05 DIAGNOSIS — R0609 Other forms of dyspnea: Secondary | ICD-10-CM | POA: Diagnosis not present

## 2023-10-05 DIAGNOSIS — I2510 Atherosclerotic heart disease of native coronary artery without angina pectoris: Secondary | ICD-10-CM | POA: Diagnosis not present

## 2023-10-05 DIAGNOSIS — I509 Heart failure, unspecified: Secondary | ICD-10-CM | POA: Diagnosis not present

## 2023-10-05 DIAGNOSIS — N1832 Chronic kidney disease, stage 3b: Secondary | ICD-10-CM | POA: Diagnosis not present

## 2023-10-05 DIAGNOSIS — I10 Essential (primary) hypertension: Secondary | ICD-10-CM | POA: Diagnosis not present

## 2023-10-06 DIAGNOSIS — I350 Nonrheumatic aortic (valve) stenosis: Secondary | ICD-10-CM | POA: Diagnosis not present

## 2023-10-06 DIAGNOSIS — I10 Essential (primary) hypertension: Secondary | ICD-10-CM | POA: Diagnosis not present

## 2023-10-06 DIAGNOSIS — R0609 Other forms of dyspnea: Secondary | ICD-10-CM | POA: Diagnosis not present

## 2023-10-06 DIAGNOSIS — N1832 Chronic kidney disease, stage 3b: Secondary | ICD-10-CM | POA: Diagnosis not present

## 2023-10-06 DIAGNOSIS — I509 Heart failure, unspecified: Secondary | ICD-10-CM | POA: Diagnosis not present

## 2023-10-06 DIAGNOSIS — I2510 Atherosclerotic heart disease of native coronary artery without angina pectoris: Secondary | ICD-10-CM | POA: Diagnosis not present

## 2023-10-09 DIAGNOSIS — N1832 Chronic kidney disease, stage 3b: Secondary | ICD-10-CM | POA: Diagnosis not present

## 2023-10-09 DIAGNOSIS — I509 Heart failure, unspecified: Secondary | ICD-10-CM | POA: Diagnosis not present

## 2023-10-09 DIAGNOSIS — R0609 Other forms of dyspnea: Secondary | ICD-10-CM | POA: Diagnosis not present

## 2023-10-09 DIAGNOSIS — I350 Nonrheumatic aortic (valve) stenosis: Secondary | ICD-10-CM | POA: Diagnosis not present

## 2023-10-09 DIAGNOSIS — I10 Essential (primary) hypertension: Secondary | ICD-10-CM | POA: Diagnosis not present

## 2023-10-09 DIAGNOSIS — I2510 Atherosclerotic heart disease of native coronary artery without angina pectoris: Secondary | ICD-10-CM | POA: Diagnosis not present

## 2023-10-11 DIAGNOSIS — I509 Heart failure, unspecified: Secondary | ICD-10-CM | POA: Diagnosis not present

## 2023-10-11 DIAGNOSIS — I10 Essential (primary) hypertension: Secondary | ICD-10-CM | POA: Diagnosis not present

## 2023-10-11 DIAGNOSIS — I2510 Atherosclerotic heart disease of native coronary artery without angina pectoris: Secondary | ICD-10-CM | POA: Diagnosis not present

## 2023-10-11 DIAGNOSIS — I350 Nonrheumatic aortic (valve) stenosis: Secondary | ICD-10-CM | POA: Diagnosis not present

## 2023-10-11 DIAGNOSIS — N1832 Chronic kidney disease, stage 3b: Secondary | ICD-10-CM | POA: Diagnosis not present

## 2023-10-11 DIAGNOSIS — R0609 Other forms of dyspnea: Secondary | ICD-10-CM | POA: Diagnosis not present

## 2023-10-13 DIAGNOSIS — I10 Essential (primary) hypertension: Secondary | ICD-10-CM | POA: Diagnosis not present

## 2023-10-13 DIAGNOSIS — I350 Nonrheumatic aortic (valve) stenosis: Secondary | ICD-10-CM | POA: Diagnosis not present

## 2023-10-13 DIAGNOSIS — I509 Heart failure, unspecified: Secondary | ICD-10-CM | POA: Diagnosis not present

## 2023-10-13 DIAGNOSIS — R0609 Other forms of dyspnea: Secondary | ICD-10-CM | POA: Diagnosis not present

## 2023-10-13 DIAGNOSIS — I2510 Atherosclerotic heart disease of native coronary artery without angina pectoris: Secondary | ICD-10-CM | POA: Diagnosis not present

## 2023-10-13 DIAGNOSIS — N1832 Chronic kidney disease, stage 3b: Secondary | ICD-10-CM | POA: Diagnosis not present

## 2023-10-18 DIAGNOSIS — I509 Heart failure, unspecified: Secondary | ICD-10-CM | POA: Diagnosis not present

## 2023-10-18 DIAGNOSIS — I2510 Atherosclerotic heart disease of native coronary artery without angina pectoris: Secondary | ICD-10-CM | POA: Diagnosis not present

## 2023-10-18 DIAGNOSIS — N1832 Chronic kidney disease, stage 3b: Secondary | ICD-10-CM | POA: Diagnosis not present

## 2023-10-18 DIAGNOSIS — I10 Essential (primary) hypertension: Secondary | ICD-10-CM | POA: Diagnosis not present

## 2023-10-18 DIAGNOSIS — I350 Nonrheumatic aortic (valve) stenosis: Secondary | ICD-10-CM | POA: Diagnosis not present

## 2023-10-18 DIAGNOSIS — R0609 Other forms of dyspnea: Secondary | ICD-10-CM | POA: Diagnosis not present

## 2023-10-20 DIAGNOSIS — I2510 Atherosclerotic heart disease of native coronary artery without angina pectoris: Secondary | ICD-10-CM | POA: Diagnosis not present

## 2023-10-20 DIAGNOSIS — I350 Nonrheumatic aortic (valve) stenosis: Secondary | ICD-10-CM | POA: Diagnosis not present

## 2023-10-20 DIAGNOSIS — I10 Essential (primary) hypertension: Secondary | ICD-10-CM | POA: Diagnosis not present

## 2023-10-20 DIAGNOSIS — N1832 Chronic kidney disease, stage 3b: Secondary | ICD-10-CM | POA: Diagnosis not present

## 2023-10-20 DIAGNOSIS — I509 Heart failure, unspecified: Secondary | ICD-10-CM | POA: Diagnosis not present

## 2023-10-20 DIAGNOSIS — R0609 Other forms of dyspnea: Secondary | ICD-10-CM | POA: Diagnosis not present

## 2023-10-23 DIAGNOSIS — I2510 Atherosclerotic heart disease of native coronary artery without angina pectoris: Secondary | ICD-10-CM | POA: Diagnosis not present

## 2023-10-23 DIAGNOSIS — I509 Heart failure, unspecified: Secondary | ICD-10-CM | POA: Diagnosis not present

## 2023-10-23 DIAGNOSIS — R0609 Other forms of dyspnea: Secondary | ICD-10-CM | POA: Diagnosis not present

## 2023-10-23 DIAGNOSIS — N1832 Chronic kidney disease, stage 3b: Secondary | ICD-10-CM | POA: Diagnosis not present

## 2023-10-23 DIAGNOSIS — I350 Nonrheumatic aortic (valve) stenosis: Secondary | ICD-10-CM | POA: Diagnosis not present

## 2023-10-23 DIAGNOSIS — I10 Essential (primary) hypertension: Secondary | ICD-10-CM | POA: Diagnosis not present

## 2023-10-26 DIAGNOSIS — I350 Nonrheumatic aortic (valve) stenosis: Secondary | ICD-10-CM | POA: Diagnosis not present

## 2023-10-26 DIAGNOSIS — I509 Heart failure, unspecified: Secondary | ICD-10-CM | POA: Diagnosis not present

## 2023-10-26 DIAGNOSIS — R0609 Other forms of dyspnea: Secondary | ICD-10-CM | POA: Diagnosis not present

## 2023-10-26 DIAGNOSIS — I2510 Atherosclerotic heart disease of native coronary artery without angina pectoris: Secondary | ICD-10-CM | POA: Diagnosis not present

## 2023-10-26 DIAGNOSIS — N1832 Chronic kidney disease, stage 3b: Secondary | ICD-10-CM | POA: Diagnosis not present

## 2023-10-26 DIAGNOSIS — I10 Essential (primary) hypertension: Secondary | ICD-10-CM | POA: Diagnosis not present

## 2023-10-29 DIAGNOSIS — I509 Heart failure, unspecified: Secondary | ICD-10-CM | POA: Diagnosis not present

## 2023-10-29 DIAGNOSIS — R0609 Other forms of dyspnea: Secondary | ICD-10-CM | POA: Diagnosis not present

## 2023-10-29 DIAGNOSIS — I10 Essential (primary) hypertension: Secondary | ICD-10-CM | POA: Diagnosis not present

## 2023-10-29 DIAGNOSIS — I350 Nonrheumatic aortic (valve) stenosis: Secondary | ICD-10-CM | POA: Diagnosis not present

## 2023-10-29 DIAGNOSIS — N1832 Chronic kidney disease, stage 3b: Secondary | ICD-10-CM | POA: Diagnosis not present

## 2023-10-29 DIAGNOSIS — I2510 Atherosclerotic heart disease of native coronary artery without angina pectoris: Secondary | ICD-10-CM | POA: Diagnosis not present

## 2023-10-30 DIAGNOSIS — N1832 Chronic kidney disease, stage 3b: Secondary | ICD-10-CM | POA: Diagnosis not present

## 2023-10-30 DIAGNOSIS — I350 Nonrheumatic aortic (valve) stenosis: Secondary | ICD-10-CM | POA: Diagnosis not present

## 2023-10-30 DIAGNOSIS — R0609 Other forms of dyspnea: Secondary | ICD-10-CM | POA: Diagnosis not present

## 2023-10-30 DIAGNOSIS — I2510 Atherosclerotic heart disease of native coronary artery without angina pectoris: Secondary | ICD-10-CM | POA: Diagnosis not present

## 2023-10-30 DIAGNOSIS — I10 Essential (primary) hypertension: Secondary | ICD-10-CM | POA: Diagnosis not present

## 2023-10-30 DIAGNOSIS — I509 Heart failure, unspecified: Secondary | ICD-10-CM | POA: Diagnosis not present

## 2023-10-31 DIAGNOSIS — N1832 Chronic kidney disease, stage 3b: Secondary | ICD-10-CM | POA: Diagnosis not present

## 2023-10-31 DIAGNOSIS — I509 Heart failure, unspecified: Secondary | ICD-10-CM | POA: Diagnosis not present

## 2023-10-31 DIAGNOSIS — I10 Essential (primary) hypertension: Secondary | ICD-10-CM | POA: Diagnosis not present

## 2023-10-31 DIAGNOSIS — I350 Nonrheumatic aortic (valve) stenosis: Secondary | ICD-10-CM | POA: Diagnosis not present

## 2023-10-31 DIAGNOSIS — I2510 Atherosclerotic heart disease of native coronary artery without angina pectoris: Secondary | ICD-10-CM | POA: Diagnosis not present

## 2023-10-31 DIAGNOSIS — R0609 Other forms of dyspnea: Secondary | ICD-10-CM | POA: Diagnosis not present

## 2023-11-01 DIAGNOSIS — Z950 Presence of cardiac pacemaker: Secondary | ICD-10-CM | POA: Diagnosis not present

## 2023-11-01 DIAGNOSIS — R0609 Other forms of dyspnea: Secondary | ICD-10-CM | POA: Diagnosis not present

## 2023-11-01 DIAGNOSIS — I480 Paroxysmal atrial fibrillation: Secondary | ICD-10-CM | POA: Diagnosis not present

## 2023-11-01 DIAGNOSIS — N1832 Chronic kidney disease, stage 3b: Secondary | ICD-10-CM | POA: Diagnosis not present

## 2023-11-01 DIAGNOSIS — K219 Gastro-esophageal reflux disease without esophagitis: Secondary | ICD-10-CM | POA: Diagnosis not present

## 2023-11-01 DIAGNOSIS — I200 Unstable angina: Secondary | ICD-10-CM | POA: Diagnosis not present

## 2023-11-01 DIAGNOSIS — F411 Generalized anxiety disorder: Secondary | ICD-10-CM | POA: Diagnosis not present

## 2023-11-01 DIAGNOSIS — I509 Heart failure, unspecified: Secondary | ICD-10-CM | POA: Diagnosis not present

## 2023-11-01 DIAGNOSIS — I5032 Chronic diastolic (congestive) heart failure: Secondary | ICD-10-CM | POA: Diagnosis not present

## 2023-11-01 DIAGNOSIS — E039 Hypothyroidism, unspecified: Secondary | ICD-10-CM | POA: Diagnosis not present

## 2023-11-01 DIAGNOSIS — I350 Nonrheumatic aortic (valve) stenosis: Secondary | ICD-10-CM | POA: Diagnosis not present

## 2023-11-01 DIAGNOSIS — I10 Essential (primary) hypertension: Secondary | ICD-10-CM | POA: Diagnosis not present

## 2023-11-01 DIAGNOSIS — I2510 Atherosclerotic heart disease of native coronary artery without angina pectoris: Secondary | ICD-10-CM | POA: Diagnosis not present

## 2023-11-02 DIAGNOSIS — I350 Nonrheumatic aortic (valve) stenosis: Secondary | ICD-10-CM | POA: Diagnosis not present

## 2023-11-02 DIAGNOSIS — I10 Essential (primary) hypertension: Secondary | ICD-10-CM | POA: Diagnosis not present

## 2023-11-02 DIAGNOSIS — I509 Heart failure, unspecified: Secondary | ICD-10-CM | POA: Diagnosis not present

## 2023-11-02 DIAGNOSIS — R0609 Other forms of dyspnea: Secondary | ICD-10-CM | POA: Diagnosis not present

## 2023-11-02 DIAGNOSIS — N1832 Chronic kidney disease, stage 3b: Secondary | ICD-10-CM | POA: Diagnosis not present

## 2023-11-02 DIAGNOSIS — I2510 Atherosclerotic heart disease of native coronary artery without angina pectoris: Secondary | ICD-10-CM | POA: Diagnosis not present

## 2023-11-03 DIAGNOSIS — I2510 Atherosclerotic heart disease of native coronary artery without angina pectoris: Secondary | ICD-10-CM | POA: Diagnosis not present

## 2023-11-03 DIAGNOSIS — I350 Nonrheumatic aortic (valve) stenosis: Secondary | ICD-10-CM | POA: Diagnosis not present

## 2023-11-03 DIAGNOSIS — R0609 Other forms of dyspnea: Secondary | ICD-10-CM | POA: Diagnosis not present

## 2023-11-03 DIAGNOSIS — I509 Heart failure, unspecified: Secondary | ICD-10-CM | POA: Diagnosis not present

## 2023-11-03 DIAGNOSIS — N1832 Chronic kidney disease, stage 3b: Secondary | ICD-10-CM | POA: Diagnosis not present

## 2023-11-03 DIAGNOSIS — I10 Essential (primary) hypertension: Secondary | ICD-10-CM | POA: Diagnosis not present

## 2023-11-04 DIAGNOSIS — R0609 Other forms of dyspnea: Secondary | ICD-10-CM | POA: Diagnosis not present

## 2023-11-04 DIAGNOSIS — I350 Nonrheumatic aortic (valve) stenosis: Secondary | ICD-10-CM | POA: Diagnosis not present

## 2023-11-04 DIAGNOSIS — N1832 Chronic kidney disease, stage 3b: Secondary | ICD-10-CM | POA: Diagnosis not present

## 2023-11-04 DIAGNOSIS — I2510 Atherosclerotic heart disease of native coronary artery without angina pectoris: Secondary | ICD-10-CM | POA: Diagnosis not present

## 2023-11-04 DIAGNOSIS — I509 Heart failure, unspecified: Secondary | ICD-10-CM | POA: Diagnosis not present

## 2023-11-04 DIAGNOSIS — I10 Essential (primary) hypertension: Secondary | ICD-10-CM | POA: Diagnosis not present

## 2023-11-05 DIAGNOSIS — I350 Nonrheumatic aortic (valve) stenosis: Secondary | ICD-10-CM | POA: Diagnosis not present

## 2023-11-05 DIAGNOSIS — N1832 Chronic kidney disease, stage 3b: Secondary | ICD-10-CM | POA: Diagnosis not present

## 2023-11-05 DIAGNOSIS — R0609 Other forms of dyspnea: Secondary | ICD-10-CM | POA: Diagnosis not present

## 2023-11-05 DIAGNOSIS — I10 Essential (primary) hypertension: Secondary | ICD-10-CM | POA: Diagnosis not present

## 2023-11-05 DIAGNOSIS — I2510 Atherosclerotic heart disease of native coronary artery without angina pectoris: Secondary | ICD-10-CM | POA: Diagnosis not present

## 2023-11-05 DIAGNOSIS — I509 Heart failure, unspecified: Secondary | ICD-10-CM | POA: Diagnosis not present

## 2023-11-06 DIAGNOSIS — I350 Nonrheumatic aortic (valve) stenosis: Secondary | ICD-10-CM | POA: Diagnosis not present

## 2023-11-06 DIAGNOSIS — I509 Heart failure, unspecified: Secondary | ICD-10-CM | POA: Diagnosis not present

## 2023-11-06 DIAGNOSIS — N1832 Chronic kidney disease, stage 3b: Secondary | ICD-10-CM | POA: Diagnosis not present

## 2023-11-06 DIAGNOSIS — I10 Essential (primary) hypertension: Secondary | ICD-10-CM | POA: Diagnosis not present

## 2023-11-06 DIAGNOSIS — R0609 Other forms of dyspnea: Secondary | ICD-10-CM | POA: Diagnosis not present

## 2023-11-06 DIAGNOSIS — I2510 Atherosclerotic heart disease of native coronary artery without angina pectoris: Secondary | ICD-10-CM | POA: Diagnosis not present

## 2023-11-07 DIAGNOSIS — I350 Nonrheumatic aortic (valve) stenosis: Secondary | ICD-10-CM | POA: Diagnosis not present

## 2023-11-07 DIAGNOSIS — N1832 Chronic kidney disease, stage 3b: Secondary | ICD-10-CM | POA: Diagnosis not present

## 2023-11-07 DIAGNOSIS — I509 Heart failure, unspecified: Secondary | ICD-10-CM | POA: Diagnosis not present

## 2023-11-07 DIAGNOSIS — I10 Essential (primary) hypertension: Secondary | ICD-10-CM | POA: Diagnosis not present

## 2023-11-07 DIAGNOSIS — I2510 Atherosclerotic heart disease of native coronary artery without angina pectoris: Secondary | ICD-10-CM | POA: Diagnosis not present

## 2023-11-07 DIAGNOSIS — R0609 Other forms of dyspnea: Secondary | ICD-10-CM | POA: Diagnosis not present

## 2023-11-08 DIAGNOSIS — I350 Nonrheumatic aortic (valve) stenosis: Secondary | ICD-10-CM | POA: Diagnosis not present

## 2023-11-08 DIAGNOSIS — N1832 Chronic kidney disease, stage 3b: Secondary | ICD-10-CM | POA: Diagnosis not present

## 2023-11-08 DIAGNOSIS — I10 Essential (primary) hypertension: Secondary | ICD-10-CM | POA: Diagnosis not present

## 2023-11-08 DIAGNOSIS — I509 Heart failure, unspecified: Secondary | ICD-10-CM | POA: Diagnosis not present

## 2023-11-08 DIAGNOSIS — R0609 Other forms of dyspnea: Secondary | ICD-10-CM | POA: Diagnosis not present

## 2023-11-08 DIAGNOSIS — I2510 Atherosclerotic heart disease of native coronary artery without angina pectoris: Secondary | ICD-10-CM | POA: Diagnosis not present

## 2023-11-09 DIAGNOSIS — I2510 Atherosclerotic heart disease of native coronary artery without angina pectoris: Secondary | ICD-10-CM | POA: Diagnosis not present

## 2023-11-09 DIAGNOSIS — I10 Essential (primary) hypertension: Secondary | ICD-10-CM | POA: Diagnosis not present

## 2023-11-09 DIAGNOSIS — N1832 Chronic kidney disease, stage 3b: Secondary | ICD-10-CM | POA: Diagnosis not present

## 2023-11-09 DIAGNOSIS — I509 Heart failure, unspecified: Secondary | ICD-10-CM | POA: Diagnosis not present

## 2023-11-09 DIAGNOSIS — I350 Nonrheumatic aortic (valve) stenosis: Secondary | ICD-10-CM | POA: Diagnosis not present

## 2023-11-09 DIAGNOSIS — R0609 Other forms of dyspnea: Secondary | ICD-10-CM | POA: Diagnosis not present

## 2023-12-02 DEATH — deceased
